# Patient Record
Sex: Male | Born: 1955 | Race: White | Hispanic: No | Marital: Married | Smoking: Never smoker
Health system: Southern US, Community
[De-identification: ages and names within clinical notes are randomized; demographics above are authoritative.]

## PROBLEM LIST (undated history)

## (undated) DIAGNOSIS — T8859XA Other complications of anesthesia, initial encounter: Secondary | ICD-10-CM

## (undated) DIAGNOSIS — I10 Essential (primary) hypertension: Secondary | ICD-10-CM

## (undated) DIAGNOSIS — Z9889 Other specified postprocedural states: Secondary | ICD-10-CM

## (undated) DIAGNOSIS — N529 Male erectile dysfunction, unspecified: Secondary | ICD-10-CM

## (undated) DIAGNOSIS — T4145XA Adverse effect of unspecified anesthetic, initial encounter: Secondary | ICD-10-CM

## (undated) DIAGNOSIS — K819 Cholecystitis, unspecified: Secondary | ICD-10-CM

## (undated) DIAGNOSIS — N179 Acute kidney failure, unspecified: Secondary | ICD-10-CM

## (undated) DIAGNOSIS — E119 Type 2 diabetes mellitus without complications: Secondary | ICD-10-CM

## (undated) DIAGNOSIS — R112 Nausea with vomiting, unspecified: Secondary | ICD-10-CM

## (undated) HISTORY — PX: CHOLECYSTECTOMY: SHX55

---
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
## 1997-04-23 ENCOUNTER — Encounter: Admission: RE | Admit: 1997-04-23 | Discharge: 1997-04-23 | Payer: Self-pay | Admitting: Sports Medicine

## 1997-08-25 ENCOUNTER — Emergency Department (HOSPITAL_COMMUNITY): Admission: EM | Admit: 1997-08-25 | Discharge: 1997-08-25 | Payer: Self-pay | Admitting: Emergency Medicine

## 1997-10-22 ENCOUNTER — Encounter: Admission: RE | Admit: 1997-10-22 | Discharge: 1997-10-22 | Payer: Self-pay | Admitting: Family Medicine

## 1998-10-18 ENCOUNTER — Encounter: Admission: RE | Admit: 1998-10-18 | Discharge: 1998-10-18 | Payer: Self-pay | Admitting: Family Medicine

## 1998-12-06 ENCOUNTER — Encounter: Admission: RE | Admit: 1998-12-06 | Discharge: 1998-12-06 | Payer: Self-pay | Admitting: Family Medicine

## 2001-12-17 ENCOUNTER — Encounter: Admission: RE | Admit: 2001-12-17 | Discharge: 2001-12-17 | Payer: Self-pay | Admitting: Family Medicine

## 2002-01-16 HISTORY — PX: FINGER SURGERY: SHX640

## 2002-03-25 ENCOUNTER — Encounter: Admission: RE | Admit: 2002-03-25 | Discharge: 2002-03-25 | Payer: Self-pay | Admitting: Sports Medicine

## 2002-10-23 ENCOUNTER — Ambulatory Visit (HOSPITAL_COMMUNITY): Admission: EM | Admit: 2002-10-23 | Discharge: 2002-10-23 | Payer: Self-pay | Admitting: Emergency Medicine

## 2002-10-23 ENCOUNTER — Encounter: Payer: Self-pay | Admitting: Orthopedic Surgery

## 2002-10-24 ENCOUNTER — Encounter (INDEPENDENT_AMBULATORY_CARE_PROVIDER_SITE_OTHER): Payer: Self-pay | Admitting: Specialist

## 2003-08-20 ENCOUNTER — Encounter: Admission: RE | Admit: 2003-08-20 | Discharge: 2003-08-20 | Payer: Self-pay | Admitting: Family Medicine

## 2003-08-27 ENCOUNTER — Encounter: Admission: RE | Admit: 2003-08-27 | Discharge: 2003-08-27 | Payer: Self-pay | Admitting: Family Medicine

## 2005-03-21 ENCOUNTER — Ambulatory Visit: Payer: Self-pay | Admitting: Family Medicine

## 2005-03-28 ENCOUNTER — Ambulatory Visit: Payer: Self-pay | Admitting: Family Medicine

## 2005-04-06 ENCOUNTER — Ambulatory Visit: Payer: Self-pay | Admitting: Family Medicine

## 2006-03-15 DIAGNOSIS — E1165 Type 2 diabetes mellitus with hyperglycemia: Secondary | ICD-10-CM

## 2006-03-15 DIAGNOSIS — E118 Type 2 diabetes mellitus with unspecified complications: Secondary | ICD-10-CM

## 2006-06-04 ENCOUNTER — Telehealth: Payer: Self-pay | Admitting: *Deleted

## 2006-12-05 ENCOUNTER — Emergency Department (HOSPITAL_COMMUNITY): Admission: EM | Admit: 2006-12-05 | Discharge: 2006-12-05 | Payer: Self-pay | Admitting: Emergency Medicine

## 2006-12-06 ENCOUNTER — Telehealth: Payer: Self-pay | Admitting: *Deleted

## 2007-02-25 ENCOUNTER — Telehealth: Payer: Self-pay | Admitting: *Deleted

## 2007-08-13 ENCOUNTER — Telehealth: Payer: Self-pay | Admitting: Family Medicine

## 2007-11-19 ENCOUNTER — Telehealth: Payer: Self-pay | Admitting: *Deleted

## 2007-11-20 ENCOUNTER — Encounter: Payer: Self-pay | Admitting: Family Medicine

## 2008-01-17 DIAGNOSIS — N529 Male erectile dysfunction, unspecified: Secondary | ICD-10-CM

## 2008-01-17 HISTORY — DX: Male erectile dysfunction, unspecified: N52.9

## 2008-02-14 ENCOUNTER — Telehealth (INDEPENDENT_AMBULATORY_CARE_PROVIDER_SITE_OTHER): Payer: Self-pay | Admitting: *Deleted

## 2008-03-11 ENCOUNTER — Ambulatory Visit: Payer: Self-pay | Admitting: Family Medicine

## 2008-03-11 DIAGNOSIS — M7710 Lateral epicondylitis, unspecified elbow: Secondary | ICD-10-CM | POA: Insufficient documentation

## 2008-03-11 LAB — CONVERTED CEMR LAB
ALT: 9 units/L (ref 0–53)
AST: 10 units/L (ref 0–37)
Albumin: 4.5 g/dL (ref 3.5–5.2)
Alkaline Phosphatase: 52 units/L (ref 39–117)
BUN: 16 mg/dL (ref 6–23)
CO2: 24 meq/L (ref 19–32)
Calcium: 9.4 mg/dL (ref 8.4–10.5)
Chloride: 101 meq/L (ref 96–112)
Cholesterol: 194 mg/dL (ref 0–200)
Creatinine, Ser: 0.78 mg/dL (ref 0.40–1.50)
Glucose, Bld: 294 mg/dL — ABNORMAL HIGH (ref 70–99)
HCT: 44.7 % (ref 39.0–52.0)
HDL: 41 mg/dL (ref >39)
Hemoglobin: 15.6 g/dL (ref 13.0–17.0)
Hgb A1c MFr Bld: 9.6 %
LDL Cholesterol: 123 mg/dL — ABNORMAL HIGH (ref 0–99)
MCHC: 34.9 g/dL (ref 30.0–36.0)
MCV: 88.7 fL (ref 78.0–100.0)
Platelets: 197 10*3/uL (ref 150–400)
Potassium: 4.3 meq/L (ref 3.5–5.3)
RBC: 5.04 M/uL (ref 4.22–5.81)
RDW: 12.7 % (ref 11.5–15.5)
Sodium: 140 meq/L (ref 135–145)
TSH: 1.7 microintl units/mL (ref 0.350–4.50)
Total Bilirubin: 1.5 mg/dL — ABNORMAL HIGH (ref 0.3–1.2)
Total CHOL/HDL Ratio: 4.7
Total Protein: 7.4 g/dL (ref 6.0–8.3)
Triglycerides: 148 mg/dL (ref <150)
VLDL: 30 mg/dL (ref 0–40)
WBC: 4.4 10*3/uL (ref 4.0–10.5)

## 2008-03-16 ENCOUNTER — Encounter: Payer: Self-pay | Admitting: Family Medicine

## 2008-03-18 ENCOUNTER — Telehealth (INDEPENDENT_AMBULATORY_CARE_PROVIDER_SITE_OTHER): Payer: Self-pay | Admitting: *Deleted

## 2009-03-10 ENCOUNTER — Telehealth: Payer: Self-pay | Admitting: Family Medicine

## 2009-04-13 ENCOUNTER — Encounter: Payer: Self-pay | Admitting: Family Medicine

## 2009-05-24 ENCOUNTER — Telehealth: Payer: Self-pay | Admitting: *Deleted

## 2009-05-31 ENCOUNTER — Ambulatory Visit: Payer: Self-pay | Admitting: Family Medicine

## 2009-06-22 ENCOUNTER — Encounter: Payer: Self-pay | Admitting: *Deleted

## 2009-07-20 ENCOUNTER — Encounter: Payer: Self-pay | Admitting: *Deleted

## 2010-02-17 NOTE — Letter (Signed)
Summary: Suspension Letter  [facility]  [address]   [place], [place] [address]   Phone: [phone]  Fax: [phone]    04/13/2009  Brendan [name] [address] [place], [place]  [address]  Dear Mr. Brendan [name],  You have missed 4 scheduled appointments with our practice.If you cannot keep your appointment, we expect you to call and cancel at least 24 hours before your appointment time.  As per our policy, we will now only give you limited medical services. means we will not call in a refill for you, or complete a form or make a referral except when you are here for a scheduled office visit.   If you miss 1 more appointments in the next year, we will dismiss you from our practice.  We hope this does not happen.  If you keep your appointments for the next year you will be returned to regular patient status.  We hope these changes will encourage you to keep your appointments so we may provide you the best medical care.   Our office staff can be reached at [phone] Monday through Friday from 8:30 a.m.-5:00 p.m. and will be glad to schedule your appointment as necessary.     Sincerely,   [facility] Document: Suspension Letter mailed.  Appended Document: Suspension Letter Certified letter returned undeliverable. Brendan [name] 05/10/09 2:20 PM

## 2010-02-17 NOTE — Letter (Signed)
Summary: Termination of Care Letter  [facility]  [address]   [place], [place] [address]   Phone: [phone]  Fax: [phone]    07/20/2009 MRN: [id number]  Dear Mr. Brendan [name],  Please be advised that the [facility] will no longer be your healthcare provider.  Accordingly, all further requests for appointments and office visits will be declined.  As such, you are not allowed on the premises of the [facility].  Should you have another physician who will take over your care, please advise [place] and we will forward a copy of your medical records.  If you do not know of another physician, we will assist you with a referral.  The termination of your relationship with this office is effective immediately.  In the next 30 days if you find that you have a need for medical care we will refer you to another provider for that care.  Thank you for your cooperation.  Sincerely,  Elray Mcgregor RN  [facility]

## 2010-02-17 NOTE — Letter (Signed)
Summary: Suspension Letter  [facility]  [address]   [place], [place] [address]   Phone: [phone]  Fax: [phone]    06/22/2009  [place], [place]  [address]  Dear Mr. Brendan [name],  You have missed 4 scheduled appointments with our practice.If you cannot keep your appointment, we expect you to call and cancel at least 24 hours before your appointment time.  As per our policy, we will now only give you limited medical services. means we will not call in a refill for you, or complete a form or make a referral except when you are here for a scheduled office visit.   If you miss 2 more appointments in the next year, we will dismiss you from our practice.  We hope this does not happen.  If you keep your appointments for the next year you will be returned to regular patient status.  We hope these changes will encourage you to keep your appointments so we may provide you the best medical care.   Our office staff can be reached at [phone] Monday through Friday from 8:30 a.m.-5:00 p.m. and will be glad to schedule your appointment as necessary.     Sincerely,   [facility] Document: Suspension Letter cert mailed

## 2010-02-17 NOTE — Assessment & Plan Note (Signed)
Summary: Med refill   Vital Signs:  Patient profile:   55 year old male Weight:      150 pounds Temp:     98.3 degrees F oral Pulse rate:   95 / minute BP sitting:   142 / 92  (right arm) Cuff size:   regular  Vitals Entered By: Schuyler Amor CMA (May 31, 2009 2:52 PM) CC: F/U meds Is Patient Diabetic? Yes Pain Assessment Patient in pain? no        CC:  F/U meds.  History of Present Illness: Here for refill on metformin.  Works out of town and has not come in for follow up.  Agreed to make apt with primary MD in next few weeks for CPE.  He will come in before hand to have labs drawn and available for that day.  He had not complaints.  He will also have wife check BP at home since it was elevated today.  Did not need VIagra refilled.  Habits & Providers  Alcohol-Tobacco-Diet     Tobacco Status: never  Allergies: No Known Drug Allergies  Social History: Smoking Status:  never  Physical Exam  General:  Thin middle aged man,in no acute distress; alert,appropriate and cooperative   Impression & Recommendations:  Problem # 1:  DIABETES MELLITUS II, UNCOMPLICATED ([id number]) Refilled meds today, set up for scheduled labs for prevention before visit with primary MD. His updated medication list for this problem includes:    Bayer Childrens Aspirin 81 Mg Chew (Aspirin) .Marland Kitchen... Take 1 tablet by mouth once a day    Glucophage 500 Mg Tabs (Metformin hcl) .Marland Kitchen... 1 by mouth two times a day  Orders: [facility]- Est Level  2 ([address]212)Future Orders: Comp Met-[facility] [id number]) ... 05/23/2010 Lipid-[facility] [id number]) ... 05/23/2010 UA Microalbumin-[facility] [id number]) ... 05/23/2010 A1C-[facility] [id number]) ... 05/23/2010  Complete Medication List: 1)  Bayer Childrens Aspirin 81 Mg Chew (Aspirin) .... Take 1 tablet by mouth once a day 2)  Glucophage 500 Mg Tabs (Metformin hcl) .Marland Kitchen.. 1 by mouth two times a day 3)  Viagra 100 Mg Tabs (Sildenafil citrate) .Marland Kitchen.. 1 tab as directed  Other Orders: Future  Orders: PSA-[facility] [id number]) ... 05/23/2010  Patient Instructions: 1)  Apt with Dr. Nori Riis in the next 30 days for a physical. 2)  Come in for fasting labs before the appointment, orders are entered Prescriptions: GLUCOPHAGE 500 MG  TABS (METFORMIN HCL) 1 by mouth two times a day  #60 x 3   Entered and Authorized by:   Tereasa Coop NP   Signed by:   Tereasa Coop NP on 05/31/2009   Method used:   Electronically to        [facility] (retail)       [address]       [place], [place]  [address]       Ph: [phone]       Fax: [phone]   RxID:   [id number]

## 2010-02-17 NOTE — Progress Notes (Signed)
Summary: Rx Req  Phone Note Refill Request Call back at [phone] Message from:  Patient  Refills Requested: Medication #1:  GLUCOPHAGE 500 MG  TABS 1 by mouth two times a day PT USES [facility] IN [place].  PT HAS F/UP APPT WITH SUSAN SAXON ON 05/31/09.  Initial call taken by: Raymond Gurney,  May 24, 2009 1:38 PM  Follow-up for Phone Call        to pcp. Follow-up by: Marcell Barlow RN,  May 24, 2009 1:54 PM  Additional Follow-up for Phone Call Additional follow up Details #1::        [place] he can get refills IF he shows up for appointment Thanks!  Dorcas Mcmurray MD  May 24, 2009 4:42 PM     Prescriptions: GLUCOPHAGE 500 MG  TABS (METFORMIN HCL) 1 by mouth two times a day  #60 Each x 0   Entered and Authorized by:   Dorcas Mcmurray MD   Signed by:   Dorcas Mcmurray MD on 05/24/2009   Method used:   Electronically to        [facility] (retail)       [address]       [place], [place]  [address]       Ph: [phone]       Fax: [phone]   RxID:   [id number]  called and informed pt of rx.Audelia Hives CMA  May 24, 2009 5:33 PM

## 2010-02-17 NOTE — Progress Notes (Signed)
Summary: Rx Req  Phone Note Refill Request Call back at Home Phone [phone] Message from:  Patient  Refills Requested: Medication #1:  GLUCOPHAGE 500 MG  TABS 1 by mouth two times a day PT USES [facility] IN [place].  Initial call taken by: Raymond Gurney,  March 10, 2009 10:19 AM  Follow-up for Phone Call        [facility] please call in one month only. Please tell him he is NOT following our arrangement--as a diabetic I need to see him at LEAST every 4 months. This is his LAST REFILL unless he starts showing up for his appointments Thanks!  Dorcas Mcmurray MD  March 10, 2009 3:57 PM   Additional Follow-up for Phone Call Additional follow up Details #1::        Left message for Pt to return call Additional Follow-up by: Isabelle Course,  March 10, 2009 4:42 PM    Additional Follow-up for Phone Call Additional follow up Details #2::    Someone answered phone, the number we have is not good to reach him, called Rx into [facility] on [place] and left message that this is his last refill til he sees Dr Nori Riis for F/U on DM with the Rx.  To PCP Follow-up by: Isabelle Course,  March 11, 2009 11:34 AM

## 2010-06-03 NOTE — Op Note (Signed)
NAME:  ANTWANE, VONHOLTEN                        ACCOUNT NO.:  [id number]   MEDICAL RECORD NO.:  [id number]                   PATIENT TYPE:  EMS   LOCATION:  ED                                   FACILITY:  [facility]   PHYSICIAN:  Satira Anis. Blanchie Dessert, M.D.         DATE OF BIRTH:  [birth year]   DATE OF PROCEDURE:  DATE OF DISCHARGE:                                 OPERATIVE REPORT   PREOPERATIVE DIAGNOSES:  1. 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  2. Left middle finger circumferential laceration with viable tip.   POSTOPERATIVE DIAGNOSES:  1. 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  2. Left middle finger circumferential laceration with viable tip.   PROCEDURE:  1. I&D left middle finger skin and subcutaneous tissue and deep tendon     tissue with loose closure performed.  2. I&D left ring finger with exploration of neurovascular structures and     subsequent completion of a near amputation which was dysvascular. This     was a revision amputation in essence with bilateral neurectomy.   SURGEON:  Satira Anis. Amedeo Plenty, M.D.   ASSISTANT:  None.   COMPLICATIONS:  None.   ANESTHESIA:  Local Marcaine block with light IV sedation.   TOURNIQUET TIME:  Zero.   DRAINS:  None.   INDICATIONS FOR PROCEDURE:  The patient is a 55 year old male who presents  with the above mentioned diagnosis. I have counselled him in regards to the  risks and benefits of surgery including risk of infection, bleeding,  anesthesia, damage to normal structures and failure of the surgery to  accomplish its intended goals of relieving symptoms and restoring function.  With this in mind, he desires to proceed. All questions have been encouraged  and answered preoperatively.   FINDINGS:  This patient had a dysvascular ring finger at the DIP joint  level. I did I&D this, explore  it and pinned this to see if I could get any  meaningful blood flow. I then explored the neurovascular structures, they  were all unrepairable and given the dysvascular digit, I then completed the  amputation at the DIP level including bilateral neurectomies. The middle  finger was intact in terms of his blood flow. This underwent I&D and loose  closure. Small and index fingers were intact.   DESCRIPTION OF PROCEDURE:  The patient was seen by myself and anesthesia. He  was given a gram of Ancef preoperatively and a tetanus shot, taken to the  operative suite. Consent was signed. He understood the nature of the  operation and that ring finger amputation was 99% likely in my estimation. I  discussed these issues with him preoperatively. Once in the operative suite,  he was laid supine, appropriately padded, prepped and draped in the usual  sterile fashion, Betadine  scrub and paint performed by myself. Once this was  done, I then removed two tight rings atraumatically about the ring finger.  Following this, I then performed an I&D with copious amounts of warm lavage  under a sterile field. The Marcaine block previously placed on the holding  area by myself was in excellent working position and he had no pain. He was  given IV sedation by the anesthesiology department. Once this was done, the  patient had the ring finger identified, this was dysvascular and looked  quite poor with neurovascular injury as well as tendon injury. I did pin  this with the 0.045 K wire to see if there would be any meaningful  vascularity to be accomplished. I then explored the neurovascular structures  which were inadequate and severely torn and ripped apart. The patient had  segmental injury about the area and no possibility of replantation. Thus I  removed the pin and then completed the amputation. This was done by placing  hemostats about the nerves and performing bilateral neurectomies with  crushing  cauterization and sharp knife blade sectioning and allowing this to  retract proximally. Following this, I then removed the remaining portion of  bone at the DIP joint and completed a DIP level amputation. The flexor  tendon was already markedly abnormal and I did pull the remnants distally,  resect this and allow it to retract. The vascular structures were obviously  torn, clotted and of poor quality. These were addressed as well. I irrigated  this well and then closed the wound with a dorsal flap after I oblated the  nail as best as possible with the notion that nail remnant could grow back.  The patient tolerated this well without difficulty. Following this, the  middle finger was dressed with I&D of the skin and subcutaneous tissue,  tendinous tissue and very loose closure with interrupted chromic suture. The  middle finger maintained excellent vascularity throughout the case, there  was no problematic circulatory issues with this. Following this, I placed  Xeroform on the wounds and a sterile dressing with volar plaster splint. He  tolerated this well without difficulty. He was taken to the recovery room  after the surgery and was given a gram of Ancef. He will be discharged home  on Keflex, Percocet and Robaxin and return to see me in the office in seven  days. I have discussed with him the do's and dont's, etc. and all questions  have been encouraged and answered. I will ask him to avoid caffeine and  primary or secondary smoke. He will notify me should any problems occur. It  has been an absolute pleasure to see him today. All questions have been  encouraged and answered.                                               Satira Anis. Blanchie Dessert, M.D.    Sampson Si  D:  10/23/2002  T:  10/24/2002  Job:  [id number]

## 2010-10-25 LAB — URINALYSIS, ROUTINE W REFLEX MICROSCOPIC
Bilirubin Urine: NEGATIVE
Glucose, UA: >1000 — AB
Hgb urine dipstick: NEGATIVE
Ketones, ur: 15 — AB
Leukocytes, UA: NEGATIVE
Nitrite: NEGATIVE
Protein, ur: NEGATIVE
Specific Gravity, Urine: 1.04 — ABNORMAL HIGH
Urobilinogen, UA: 0.2
pH: 5.5

## 2010-10-25 LAB — I-STAT 8, (EC8 V) (CONVERTED LAB)
BUN: 16
Bicarbonate: 29.8 — ABNORMAL HIGH
Chloride: 100
Glucose, Bld: 446 — ABNORMAL HIGH
HCT: 51
Hemoglobin: 17.3 — ABNORMAL HIGH
Operator id: 288831
Sodium: 133 — ABNORMAL LOW

## 2010-10-25 LAB — URINE MICROSCOPIC-ADD ON

## 2012-02-24 ENCOUNTER — Emergency Department (HOSPITAL_COMMUNITY): Payer: Self-pay

## 2012-02-24 ENCOUNTER — Emergency Department (HOSPITAL_COMMUNITY)
Admission: EM | Admit: 2012-02-24 | Discharge: 2012-02-24 | Disposition: A | Discharge status: Home / Self Care | Payer: Self-pay | Attending: Emergency Medicine | Admitting: Emergency Medicine

## 2012-02-24 DIAGNOSIS — W07XXXA Fall from chair, initial encounter: Secondary | ICD-10-CM | POA: Insufficient documentation

## 2012-02-24 DIAGNOSIS — S4980XA Other specified injuries of shoulder and upper arm, unspecified arm, initial encounter: Secondary | ICD-10-CM | POA: Insufficient documentation

## 2012-02-24 DIAGNOSIS — S199XXA Unspecified injury of neck, initial encounter: Secondary | ICD-10-CM | POA: Insufficient documentation

## 2012-02-24 DIAGNOSIS — R51 Headache: Secondary | ICD-10-CM | POA: Insufficient documentation

## 2012-02-24 DIAGNOSIS — Y939 Activity, unspecified: Secondary | ICD-10-CM | POA: Insufficient documentation

## 2012-02-24 DIAGNOSIS — S46909A Unspecified injury of unspecified muscle, fascia and tendon at shoulder and upper arm level, unspecified arm, initial encounter: Secondary | ICD-10-CM | POA: Insufficient documentation

## 2012-02-24 DIAGNOSIS — S0993XA Unspecified injury of face, initial encounter: Secondary | ICD-10-CM | POA: Insufficient documentation

## 2012-02-24 DIAGNOSIS — S0100XA Unspecified open wound of scalp, initial encounter: Secondary | ICD-10-CM | POA: Insufficient documentation

## 2012-02-24 DIAGNOSIS — Y929 Unspecified place or not applicable: Secondary | ICD-10-CM | POA: Insufficient documentation

## 2012-02-24 DIAGNOSIS — R42 Dizziness and giddiness: Secondary | ICD-10-CM | POA: Insufficient documentation

## 2012-02-26 MED FILL — Oxycodone w/ Acetaminophen Tab 5-325 MG: ORAL | Qty: 1 | Status: AC

## 2012-02-26 MED FILL — Tet Tox-Diph-Acell Pertuss Ad Inj 5-2.5-18.5 LF-LF-MCG/0.5ML: INTRAMUSCULAR | Qty: 0.5 | Status: AC

## 2012-02-26 MED FILL — Oxycodone w/ Acetaminophen Tab 5-325 MG: ORAL | Qty: 2 | Status: AC

## 2012-03-04 ENCOUNTER — Telehealth: Payer: Self-pay | Admitting: Psychology

## 2012-03-04 NOTE — Telephone Encounter (Signed)
Received VM at 11:50 from Brendan [name] looking to confirm an appointment with Dr. Tammi Klippel.  Checked record - not a patient of the [facility]  No appointments listed.  Can't tell where he might have gotten a referral to the Dr. Tammi Klippel that works with me in the [facility] at [facility].  Tried the call back number left on my VM - no answer no machine.  Tried the other two numbers listed as contacts - both were disconnected.  Will await follow-up from the patient.

## 2012-03-11 ENCOUNTER — Ambulatory Visit (INDEPENDENT_AMBULATORY_CARE_PROVIDER_SITE_OTHER): Payer: Self-pay | Admitting: Family Medicine

## 2012-03-11 ENCOUNTER — Encounter: Payer: Self-pay | Admitting: Family Medicine

## 2012-03-11 VITALS — BP 150/93 | HR 101 | Temp 97.9°F | Ht 71.0 in | Wt 146.9 lb

## 2012-03-11 DIAGNOSIS — E119 Type 2 diabetes mellitus without complications: Secondary | ICD-10-CM

## 2012-03-11 DIAGNOSIS — R030 Elevated blood-pressure reading, without diagnosis of hypertension: Secondary | ICD-10-CM

## 2012-03-11 DIAGNOSIS — Z0000 Encounter for general adult medical examination without abnormal findings: Secondary | ICD-10-CM

## 2012-03-11 DIAGNOSIS — IMO0001 Reserved for inherently not codable concepts without codable children: Secondary | ICD-10-CM | POA: Insufficient documentation

## 2012-03-11 LAB — POCT GLYCOSYLATED HEMOGLOBIN (HGB A1C): Hemoglobin A1C: 12

## 2012-03-11 MED ORDER — METFORMIN HCL 500 MG PO TABS: 500.0000 mg | ORAL_TABLET | Freq: Two times a day (BID) | ORAL | Status: DC

## 2012-03-11 MED ORDER — GLIPIZIDE 5 MG PO TABS: 5.0000 mg | ORAL_TABLET | Freq: Two times a day (BID) | ORAL | Status: DC

## 2012-03-11 NOTE — Assessment & Plan Note (Addendum)
Patient instructed to restart daily 81 mg aspirin for primary prevention of CAD.

## 2012-03-11 NOTE — Assessment & Plan Note (Addendum)
BP elevated at 150/93 today.  Will recheck at next visit.  If elevated with discuss starting antihypertensive medication(s) with patient.

## 2012-03-11 NOTE — Progress Notes (Signed)
Subjective:     Patient ID: Brendan [name], male   DOB: 10-21-1955, 57 y.o.   MRN: [id number]  HPI 57 year old gentleman with a PMH of DM-2 presents to the clinic to reestablish care.  1) DM - 2 - Patient was last seen in 2011.  Patient was on metformin 500 mg BID at that time.  - He has been out of medication for approximately 3 years. - Patient has a meter at home but does not take his blood sugar. - Patient requesting restart of his metformin. - ROS: Denies chest pain, SOB. Reports recent cough. Also reports polyuria and polydispsia.  Denies visual changes.  Review of Systems See HPI    Objective:   Physical Exam  Filed Vitals:   03/11/12 0850  BP: 150/93  Pulse: 101  Temp: 97.9 F (36.6 C)   General: well appearing, NAD. Neck: supple, no thyromegaly. Heart: RRR, no murmurs, rubs, or gallops. Lungs: CTAB. No rales, rhonchi, or wheeze. Abdomen: soft, nontender, nondistended. No organomegaly. Extremities: no cyanosis, clubbing, or edema.    Assessment:         Plan:

## 2012-03-11 NOTE — Patient Instructions (Addendum)
I have sent in your prescriptions to the pharmacy.  Please take them as prescribed.   Additionally, please start checking your blood sugar daily (at least once a day).  Please record these readings for me and bring it to your next visit.  Please start daily baby aspirin (81 mg)  Follow up with me in 3 months or sooner if needed.

## 2012-03-11 NOTE — Assessment & Plan Note (Addendum)
A1C today was 12.0.  Restarted Metformin 500 mg BID today.  Also started Glipizide 5 mg BID.  Patient encourage to take blood sugar daily.  Patient instructed to call if he needs a new meter and additional strips and lancets.  Will see patient back in 3 months with A1C and CMP at that time.

## 2012-03-12 ENCOUNTER — Telehealth: Payer: Self-pay | Admitting: Family Medicine

## 2012-03-12 MED ORDER — RELION PRIME MONITOR DEVI: 1.0000 | Status: DC

## 2012-03-12 MED ORDER — RELION LANCETS ULTRA-THIN 30G MISC: Status: DC

## 2012-03-12 MED ORDER — GLUCOSE BLOOD VI STRP: ORAL_STRIP | Status: DC

## 2012-03-12 NOTE — Telephone Encounter (Signed)
Rx for Meter, Strips and Lancets it at the front.

## 2012-03-12 NOTE — Telephone Encounter (Signed)
Pt states that his meter is not working well and needs another script to get this as well as test strips.  pls call when ready   He also forgot to get his flu shot yesterday and wants to get this when he picks up script.

## 2013-03-16 DIAGNOSIS — K819 Cholecystitis, unspecified: Secondary | ICD-10-CM

## 2013-03-16 HISTORY — DX: Cholecystitis, unspecified: K81.9

## 2013-04-01 ENCOUNTER — Observation Stay (HOSPITAL_COMMUNITY)
Admission: EM | Admit: 2013-04-01 | Discharge: 2013-04-04 | Disposition: A | Discharge status: Home / Self Care | Payer: Self-pay | Attending: Surgery | Admitting: Surgery

## 2013-04-01 ENCOUNTER — Encounter (HOSPITAL_COMMUNITY): Payer: Self-pay | Admitting: Emergency Medicine

## 2013-04-01 ENCOUNTER — Observation Stay (HOSPITAL_COMMUNITY): Payer: Self-pay

## 2013-04-01 ENCOUNTER — Emergency Department (HOSPITAL_COMMUNITY): Payer: Self-pay

## 2013-04-01 DIAGNOSIS — Y836 Removal of other organ (partial) (total) as the cause of abnormal reaction of the patient, or of later complication, without mention of misadventure at the time of the procedure: Secondary | ICD-10-CM | POA: Insufficient documentation

## 2013-04-01 DIAGNOSIS — N529 Male erectile dysfunction, unspecified: Secondary | ICD-10-CM | POA: Insufficient documentation

## 2013-04-01 DIAGNOSIS — F1010 Alcohol abuse, uncomplicated: Secondary | ICD-10-CM | POA: Insufficient documentation

## 2013-04-01 DIAGNOSIS — K8043 Calculus of bile duct with acute cholecystitis with obstruction: Principal | ICD-10-CM | POA: Insufficient documentation

## 2013-04-01 DIAGNOSIS — K219 Gastro-esophageal reflux disease without esophagitis: Secondary | ICD-10-CM | POA: Insufficient documentation

## 2013-04-01 DIAGNOSIS — IMO0001 Reserved for inherently not codable concepts without codable children: Secondary | ICD-10-CM | POA: Insufficient documentation

## 2013-04-01 DIAGNOSIS — R740 Nonspecific elevation of levels of transaminase and lactic acid dehydrogenase [LDH]: Secondary | ICD-10-CM

## 2013-04-01 DIAGNOSIS — R7402 Elevation of levels of lactic acid dehydrogenase (LDH): Secondary | ICD-10-CM

## 2013-04-01 DIAGNOSIS — E1165 Type 2 diabetes mellitus with hyperglycemia: Secondary | ICD-10-CM

## 2013-04-01 DIAGNOSIS — Z794 Long term (current) use of insulin: Secondary | ICD-10-CM | POA: Insufficient documentation

## 2013-04-01 DIAGNOSIS — I10 Essential (primary) hypertension: Secondary | ICD-10-CM | POA: Insufficient documentation

## 2013-04-01 DIAGNOSIS — R7401 Elevation of levels of liver transaminase levels: Secondary | ICD-10-CM

## 2013-04-01 DIAGNOSIS — R1011 Right upper quadrant pain: Secondary | ICD-10-CM

## 2013-04-01 DIAGNOSIS — IMO0002 Reserved for concepts with insufficient information to code with codable children: Secondary | ICD-10-CM

## 2013-04-01 DIAGNOSIS — E118 Type 2 diabetes mellitus with unspecified complications: Secondary | ICD-10-CM

## 2013-04-01 DIAGNOSIS — K810 Acute cholecystitis: Secondary | ICD-10-CM

## 2013-04-01 DIAGNOSIS — K8050 Calculus of bile duct without cholangitis or cholecystitis without obstruction: Secondary | ICD-10-CM

## 2013-04-01 DIAGNOSIS — K9186 Retained cholelithiasis following cholecystectomy: Secondary | ICD-10-CM | POA: Insufficient documentation

## 2013-04-01 DIAGNOSIS — K8020 Calculus of gallbladder without cholecystitis without obstruction: Secondary | ICD-10-CM

## 2013-04-01 DIAGNOSIS — Z0000 Encounter for general adult medical examination without abnormal findings: Secondary | ICD-10-CM

## 2013-04-01 HISTORY — DX: Essential (primary) hypertension: I10

## 2013-04-01 HISTORY — DX: Nausea with vomiting, unspecified: R11.2

## 2013-04-01 HISTORY — DX: Adverse effect of unspecified anesthetic, initial encounter: T41.45XA

## 2013-04-01 HISTORY — DX: Cholecystitis, unspecified: K81.9

## 2013-04-01 HISTORY — DX: Other specified postprocedural states: Z98.890

## 2013-04-01 HISTORY — DX: Other complications of anesthesia, initial encounter: T88.59XA

## 2013-04-01 HISTORY — DX: Male erectile dysfunction, unspecified: N52.9

## 2013-04-01 HISTORY — DX: Type 2 diabetes mellitus without complications: E11.9

## 2013-04-01 LAB — URINALYSIS, ROUTINE W REFLEX MICROSCOPIC
Glucose, UA: >1000 mg/dL — AB
Hgb urine dipstick: NEGATIVE
LEUKOCYTES UA: NEGATIVE
NITRITE: NEGATIVE
PH: 5 (ref 5.0–8.0)
Protein, ur: NEGATIVE mg/dL
SPECIFIC GRAVITY, URINE: 1.038 — AB (ref 1.005–1.030)
UROBILINOGEN UA: 0.2 mg/dL (ref 0.0–1.0)

## 2013-04-01 LAB — CBC WITH DIFFERENTIAL/PLATELET
Basophils Absolute: 0 10*3/uL (ref 0.0–0.1)
Basophils Relative: 0 % (ref 0–1)
EOS ABS: 0 10*3/uL (ref 0.0–0.7)
Eosinophils Relative: 0 % (ref 0–5)
HCT: 40.8 % (ref 39.0–52.0)
Hemoglobin: 14.7 g/dL (ref 13.0–17.0)
LYMPHS ABS: 0.9 10*3/uL (ref 0.7–4.0)
LYMPHS PCT: 5 % — AB (ref 12–46)
MCH: 31.3 pg (ref 26.0–34.0)
MCHC: 36 g/dL (ref 30.0–36.0)
MCV: 86.8 fL (ref 78.0–100.0)
Monocytes Absolute: 0.5 10*3/uL (ref 0.1–1.0)
Monocytes Relative: 3 % (ref 3–12)
NEUTROS PCT: 92 % — AB (ref 43–77)
Neutro Abs: 14.9 10*3/uL — ABNORMAL HIGH (ref 1.7–7.7)
PLATELETS: 248 10*3/uL (ref 150–400)
RBC: 4.7 MIL/uL (ref 4.22–5.81)
RDW: 12 % (ref 11.5–15.5)
WBC: 16.3 10*3/uL — AB (ref 4.0–10.5)

## 2013-04-01 LAB — GLUCOSE, CAPILLARY
GLUCOSE-CAPILLARY: 305 mg/dL — AB (ref 70–99)
Glucose-Capillary: 201 mg/dL — ABNORMAL HIGH (ref 70–99)

## 2013-04-01 LAB — COMPREHENSIVE METABOLIC PANEL
ALT: 334 U/L — ABNORMAL HIGH (ref 0–53)
AST: 156 U/L — ABNORMAL HIGH (ref 0–37)
Albumin: 3.4 g/dL — ABNORMAL LOW (ref 3.5–5.2)
Alkaline Phosphatase: 330 U/L — ABNORMAL HIGH (ref 39–117)
BILIRUBIN TOTAL: 7.1 mg/dL — AB (ref 0.3–1.2)
BUN: 18 mg/dL (ref 6–23)
CHLORIDE: 87 meq/L — AB (ref 96–112)
CO2: 22 meq/L (ref 19–32)
CREATININE: 0.62 mg/dL (ref 0.50–1.35)
Calcium: 9.4 mg/dL (ref 8.4–10.5)
Glucose, Bld: 341 mg/dL — ABNORMAL HIGH (ref 70–99)
Potassium: 4.3 mEq/L (ref 3.7–5.3)
Sodium: 131 mEq/L — ABNORMAL LOW (ref 137–147)
Total Protein: 7.2 g/dL (ref 6.0–8.3)

## 2013-04-01 LAB — URINE MICROSCOPIC-ADD ON

## 2013-04-01 LAB — TROPONIN I: Troponin I: <0.3 ng/mL (ref <0.30)

## 2013-04-01 LAB — LIPASE, BLOOD: LIPASE: 17 U/L (ref 11–59)

## 2013-04-01 LAB — HEMOGLOBIN A1C
Hgb A1c MFr Bld: 12 % — ABNORMAL HIGH (ref <5.7)
Mean Plasma Glucose: 298 mg/dL — ABNORMAL HIGH (ref <117)

## 2013-04-01 LAB — CBG MONITORING, ED
GLUCOSE-CAPILLARY: 326 mg/dL — AB (ref 70–99)
Glucose-Capillary: 293 mg/dL — ABNORMAL HIGH (ref 70–99)

## 2013-04-01 MED ORDER — MORPHINE SULFATE 2 MG/ML IJ SOLN
2.0000 mg | INTRAMUSCULAR | Status: DC | PRN
  Administered 2013-04-01 – 2013-04-02 (×5): 2 mg via INTRAVENOUS
  Filled 2013-04-01 (×6): qty 1

## 2013-04-01 MED ORDER — ACETAMINOPHEN 325 MG PO TABS: 650.0000 mg | ORAL_TABLET | Freq: Four times a day (QID) | ORAL | Status: DC | PRN

## 2013-04-01 MED ORDER — ONDANSETRON HCL 4 MG/2ML IJ SOLN
4.0000 mg | Freq: Four times a day (QID) | INTRAMUSCULAR | Status: DC | PRN
Start: 2013-04-01 — End: 2013-04-04

## 2013-04-01 MED ORDER — PANTOPRAZOLE SODIUM 40 MG PO TBEC
40.0000 mg | DELAYED_RELEASE_TABLET | Freq: Every day | ORAL | Status: DC
  Administered 2013-04-01 – 2013-04-03 (×2): 40 mg via ORAL
  Filled 2013-04-01 (×2): qty 1

## 2013-04-01 MED ORDER — GADOBENATE DIMEGLUMINE 529 MG/ML IV SOLN
15.0000 mL | Freq: Once | INTRAVENOUS | Status: AC
  Administered 2013-04-01: 12 mL via INTRAVENOUS

## 2013-04-01 MED ORDER — ACETAMINOPHEN 650 MG RE SUPP: 650.0000 mg | Freq: Four times a day (QID) | RECTAL | Status: DC | PRN

## 2013-04-01 MED ORDER — ONDANSETRON HCL 4 MG/2ML IJ SOLN
4.0000 mg | Freq: Once | INTRAMUSCULAR | Status: AC
  Administered 2013-04-01: 4 mg via INTRAVENOUS
  Filled 2013-04-01: qty 2

## 2013-04-01 MED ORDER — HYDROCODONE-ACETAMINOPHEN 5-325 MG PO TABS
1.0000 | ORAL_TABLET | ORAL | Status: DC | PRN
  Administered 2013-04-01 – 2013-04-03 (×5): 2 via ORAL
  Filled 2013-04-01 (×5): qty 2

## 2013-04-01 MED ORDER — SODIUM CHLORIDE 0.9 % IV SOLN: INTRAVENOUS | Status: DC

## 2013-04-01 MED ORDER — SODIUM CHLORIDE 0.9 % IV SOLN
INTRAVENOUS | Status: DC
  Administered 2013-04-01 – 2013-04-04 (×6): via INTRAVENOUS

## 2013-04-01 MED ORDER — HYDROMORPHONE HCL PF 1 MG/ML IJ SOLN
1.0000 mg | Freq: Once | INTRAMUSCULAR | Status: AC
  Administered 2013-04-01: 1 mg via INTRAVENOUS
  Filled 2013-04-01: qty 1

## 2013-04-01 MED ORDER — INSULIN ASPART 100 UNIT/ML ~~LOC~~ SOLN
0.0000 [IU] | Freq: Three times a day (TID) | SUBCUTANEOUS | Status: DC
  Administered 2013-04-01: 11 [IU] via SUBCUTANEOUS
  Administered 2013-04-02: 8 [IU] via SUBCUTANEOUS
  Administered 2013-04-03: 5 [IU] via SUBCUTANEOUS
  Administered 2013-04-03: 3 [IU] via SUBCUTANEOUS
  Administered 2013-04-03: 8 [IU] via SUBCUTANEOUS
  Administered 2013-04-04: 3 [IU] via SUBCUTANEOUS
  Administered 2013-04-04: 2 [IU] via SUBCUTANEOUS

## 2013-04-01 MED ORDER — ENOXAPARIN SODIUM 40 MG/0.4ML ~~LOC~~ SOLN
40.0000 mg | Freq: Every day | SUBCUTANEOUS | Status: DC
  Administered 2013-04-01 – 2013-04-04 (×3): 40 mg via SUBCUTANEOUS
  Filled 2013-04-01 (×4): qty 0.4

## 2013-04-01 MED ORDER — SODIUM CHLORIDE 0.9 % IV SOLN
3.0000 g | Freq: Four times a day (QID) | INTRAVENOUS | Status: DC
  Administered 2013-04-01 – 2013-04-04 (×10): 3 g via INTRAVENOUS
  Filled 2013-04-01 (×13): qty 3

## 2013-04-01 NOTE — ED Provider Notes (Signed)
CSN: [id number]     Arrival date & time 04/01/13  1109 History   First MD Initiated Contact with Patient 04/01/13 1118     Chief Complaint  Patient presents with  . Abdominal Pain  . Emesis     (Consider location/radiation/quality/duration/timing/severity/associated sxs/prior Treatment) The history is provided by the patient. No language interpreter was used.  Brendan [name] is a 58 y/o M with PMHx of DM and appendectomy presenting to the ED with abdominal pain, nausea, vomiting that started abruptly Saturday night after the patient drank 4 beers. Patient reported that the pain is localized to the right upper quadrant described as a soreness that has a sharp pain with motion. Stated that he has been unable to keep any food or fluids down. Stated that he has had at least 15 episodes of emesis on Sunday and 5 episodes of emesis yesterday. Patient reported that he was given a stomach pill from his wife, cannot recall the name, and stated that it did not alleviate any of the pain. Patient reported that he has been feeling mild chest discomfort. Reported that the chest pain worsens with laying down reported that it is a feeling of indigestion. Patient reported that he has been feeling fatigued and weak. Reported that similar symptoms occurred approximately 3 weeks ago and that was able to shake it off. Denies shortness of breath, difficulty breathing, diarrhea, melena, hematochezia, fever, chills, sweats, diaphoresis, dizziness, headache, visual distortions, numbness, tingling. Reported he drinks alcohol a least a 6-12 pack per week. Denied cigarette use, marijuana, heroine cocaine. PCP Dr. Lacinda Axon  Past Medical History  Diagnosis Date  . Diabetes mellitus without complication    Past Surgical History  Procedure Laterality Date  . Appendectomy     No family history on file. History  Substance Use Topics  . Smoking status: Never Smoker   . Smokeless tobacco: Not on file  . Alcohol Use: Yes   Comment: occ    Review of Systems  Constitutional: Negative for fever and chills.  Eyes: Negative for visual disturbance.  Respiratory: Negative for chest tightness and shortness of breath.   Cardiovascular: Positive for chest pain.  Gastrointestinal: Positive for nausea, vomiting and abdominal pain. Negative for diarrhea, constipation, blood in stool and anal bleeding.  Genitourinary: Negative for decreased urine volume.  Musculoskeletal: Negative for back pain and neck pain.  Neurological: Positive for weakness. Negative for dizziness and numbness.  All other systems reviewed and are negative.      Allergies  Review of patient's allergies indicates no known allergies.  Home Medications   Current Outpatient Rx  Name  Route  Sig  Dispense  Refill  . Blood Glucose Monitoring Suppl (RELION PRIME MONITOR) DEVI   Does not apply   1 Device by Does not apply route as directed.   1 Device   0   . glipiZIDE (GLUCOTROL) 5 MG tablet   Oral   Take 1 tablet (5 mg total) by mouth 2 (two) times daily before a meal.   60 tablet   3   . glucose blood (RELION PRIME TEST) test strip      Use to test blood sugar up to 4 times daily.   100 each   3   . metFORMIN (GLUCOPHAGE) 500 MG tablet   Oral   Take 1 tablet (500 mg total) by mouth 2 (two) times daily with a meal.   60 tablet   3   . ReliOn Ultra Thin Lancets MISC  Use to test blood sugar up to 4 times daily.   210 each   3    BP 143/84  Pulse 110  Temp(Src) 98.1 F (36.7 C) (Oral)  Resp 19  Ht 5\' 10"  (1.778 m)  Wt 145 lb (65.772 kg)  BMI 20.81 kg/m2  SpO2 100% Physical Exam  Nursing note and vitals reviewed. Constitutional: He is oriented to person, place, and time. He appears well-developed and well-nourished. No distress.  HENT:  Head: Normocephalic and atraumatic.  Mouth/Throat: No oropharyngeal exudate.  Dry mucus membranes  Eyes: Conjunctivae and EOM are normal. Pupils are equal, round, and reactive to  light. Right eye exhibits no discharge. Left eye exhibits no discharge.  Neck: Normal range of motion. Neck supple. No tracheal deviation present.  Negative neck stiffness Negative nuchal rigidity Negative cervical lymphadenopathy  Cardiovascular: Regular rhythm and normal heart sounds.  Exam reveals no friction rub.   No murmur heard. Tachycardia upon auscultation   Pulmonary/Chest: Effort normal and breath sounds normal. No respiratory distress. He has no wheezes. He has no rales.  Abdominal: Normal appearance and bowel sounds are normal. He exhibits no fluid wave, no ascites and no pulsatile midline mass. There is tenderness in the right upper quadrant. There is guarding and positive Murphy's sign. There is no rigidity, no CVA tenderness and no tenderness at McBurney's point.    Negative abdominal distension Soft upon palpation  RUQ pain - positive Murphy's sign  Musculoskeletal: Normal range of motion.  Full ROM to upper and lower extremities without difficulty noted, negative ataxia noted.  Lymphadenopathy:    He has no cervical adenopathy.  Neurological: He is alert and oriented to person, place, and time. No cranial nerve deficit. He exhibits normal muscle tone. Coordination normal.  Cranial nerves III-XII grossly intact Strength 5+/5+ to upper and lower extremities bilaterally with resistance applied, equal distribution noted Equal grip strength  Skin: Skin is warm and dry. No rash noted. He is not diaphoretic. No erythema.  Psychiatric: He has a normal mood and affect. His behavior is normal. Thought content normal.    ED Course  Procedures (including critical care time)  Results for orders placed during the hospital encounter of 04/01/13  COMPREHENSIVE METABOLIC PANEL      Result Value Ref Range   Sodium 131 (*) 137 - 147 mEq/L   Potassium 4.3  3.7 - 5.3 mEq/L   Chloride 87 (*) 96 - 112 mEq/L   CO2 22  19 - 32 mEq/L   Glucose, Bld 341 (*) 70 - 99 mg/dL   BUN 18  6 - 23  mg/dL   Creatinine, Ser 0.62  0.50 - 1.35 mg/dL   Calcium 9.4  8.4 - 10.5 mg/dL   Total Protein 7.2  6.0 - 8.3 g/dL   Albumin 3.4 (*) 3.5 - 5.2 g/dL   AST 156 (*) 0 - 37 U/L   ALT 334 (*) 0 - 53 U/L   Alkaline Phosphatase 330 (*) 39 - 117 U/L   Total Bilirubin 7.1 (*) 0.3 - 1.2 mg/dL   GFR calc non Af Amer >90  >90 mL/min   GFR calc Af Amer >90  >90 mL/min  CBC WITH DIFFERENTIAL      Result Value Ref Range   WBC 16.3 (*) 4.0 - 10.5 K/uL   RBC 4.70  4.22 - 5.81 MIL/uL   Hemoglobin 14.7  13.0 - 17.0 g/dL   HCT 40.8  39.0 - 52.0 %   MCV 86.8  78.0 -  100.0 fL   MCH 31.3  26.0 - 34.0 pg   MCHC 36.0  30.0 - 36.0 g/dL   RDW 12.0  11.5 - 15.5 %   Platelets 248  150 - 400 K/uL   Neutrophils Relative % 92 (*) 43 - 77 %   Neutro Abs 14.9 (*) 1.7 - 7.7 K/uL   Lymphocytes Relative 5 (*) 12 - 46 %   Lymphs Abs 0.9  0.7 - 4.0 K/uL   Monocytes Relative 3  3 - 12 %   Monocytes Absolute 0.5  0.1 - 1.0 K/uL   Eosinophils Relative 0  0 - 5 %   Eosinophils Absolute 0.0  0.0 - 0.7 K/uL   Basophils Relative 0  0 - 1 %   Basophils Absolute 0.0  0.0 - 0.1 K/uL  LIPASE, BLOOD      Result Value Ref Range   Lipase 17  11 - 59 U/L  URINALYSIS, ROUTINE W REFLEX MICROSCOPIC      Result Value Ref Range   Color, Urine ORANGE (*) YELLOW   APPearance CLEAR  CLEAR   Specific Gravity, Urine 1.038 (*) 1.005 - 1.030   pH 5.0  5.0 - 8.0   Glucose, UA >1000 (*) NEGATIVE mg/dL   Hgb urine dipstick NEGATIVE  NEGATIVE   Bilirubin Urine LARGE (*) NEGATIVE   Ketones, ur >80 (*) NEGATIVE mg/dL   Protein, ur NEGATIVE  NEGATIVE mg/dL   Urobilinogen, UA 0.2  0.0 - 1.0 mg/dL   Nitrite NEGATIVE  NEGATIVE   Leukocytes, UA NEGATIVE  NEGATIVE  TROPONIN I      Result Value Ref Range   Troponin I <0.30  <0.30 ng/mL  URINE MICROSCOPIC-ADD ON      Result Value Ref Range   Squamous Epithelial / LPF RARE  RARE   WBC, UA 0-2  <3 WBC/hpf   RBC / HPF 0-2  <3 RBC/hpf   Bacteria, UA FEW (*) RARE    Labs Review Labs  Reviewed  COMPREHENSIVE METABOLIC PANEL - Abnormal; Notable for the following:    Sodium 131 (*)    Chloride 87 (*)    Glucose, Bld 341 (*)    Albumin 3.4 (*)    AST 156 (*)    ALT 334 (*)    Alkaline Phosphatase 330 (*)    Total Bilirubin 7.1 (*)    All other components within normal limits  CBC WITH DIFFERENTIAL - Abnormal; Notable for the following:    WBC 16.3 (*)    Neutrophils Relative % 92 (*)    Neutro Abs 14.9 (*)    Lymphocytes Relative 5 (*)    All other components within normal limits  URINALYSIS, ROUTINE W REFLEX MICROSCOPIC - Abnormal; Notable for the following:    Color, Urine ORANGE (*)    Specific Gravity, Urine 1.038 (*)    Glucose, UA >1000 (*)    Bilirubin Urine LARGE (*)    Ketones, ur >80 (*)    All other components within normal limits  URINE MICROSCOPIC-ADD ON - Abnormal; Notable for the following:    Bacteria, UA FEW (*)    All other components within normal limits  LIPASE, BLOOD  TROPONIN I  CBG MONITORING, ED   Imaging Review US Abdomen Limited Ruq  04/01/2013   CLINICAL DATA:  Abdominal pain, nausea and vomiting  EXAM: US ABDOMEN LIMITED - RIGHT UPPER QUADRANT  COMPARISON:  None.  FINDINGS: Gallbladder:  There is echogenicity within the gallbladder representing both gallbladder sludge and  small gallstones with some acoustical shadowing. The gallbladder wall is thickened measuring 8.2 mm with some edema of the wall. In addition there is pain over the gallbladder with compression, findings very suggestive of acute cholecystitis. Clinical correlation is recommended.  Common bile duct:  Diameter: The common bile duct is normal measuring 3.9 mm in diameter.  Liver:  The liver has a relatively normal echogenic appearance. However, anteriorly there is a 1.4 cm hyper echogenic focus, most consistent with an incidental hemangioma.  IMPRESSION: 1. Ultrasound findings suspicious for acute cholecystitis. Correlate clinically. Gallstones and gallbladder sludge are  noted with gallbladder wall edema. 2. Probable incidental 1.4 cm liver hemangioma. 3. I discussed the findings of this study with Dr. Rogene Houston at the time of interpretation.   Electronically Signed   By: Ivar Drape M.D.   On: 04/01/2013 12:57     EKG Interpretation None      MDM   Final diagnoses:  Acute cholecystitis   Medications  0.9 %  sodium chloride infusion ( Intravenous New Bag/Given 04/01/13 1204)  ondansetron (ZOFRAN) injection 4 mg (4 mg Intravenous Given 04/01/13 1205)  ondansetron (ZOFRAN) injection 4 mg (4 mg Intravenous Given 04/01/13 1310)  HYDROmorphone (DILAUDID) injection 1 mg (1 mg Intravenous Given 04/01/13 1310)   Filed Vitals:   04/01/13 1145 04/01/13 1151 04/01/13 1200 04/01/13 1300  BP: 139/80 139/80 138/63 143/84  Pulse: 106  104 110  Temp:      TempSrc:      Resp: 14 16 17 19   Height:      Weight:      SpO2: 100% 100% 98% 100%    Patient presenting to the ED with abdominal pain, nausea, vomiting that started abruptly on Saturday night. Reported abdominal pain is localized to the RUQ described as a soreness sensation worse with motion described as a sharp pain. Stated that he has been unable to keep any foods or fluids down. Stated that he has been having mild chest discomfort described as an indigestion feeling. Reported that he had similar symptoms approximately 3 weeks ago that he was able to shake off. Reported that he drinks approximately 6-12 pack of beer per week.   Alert and oriented. GCS 15. Heart rate noted to be mildly tachycardic upon auscultation with normal rhythm. Radial and DP pulses 2+ bilaterally. Lungs clear to auscultation to upper and lower lobes. Bowel sounds normoactive in all 4 quadrants, soft upon palpation. Discomfort upon palpation to the right upper quadrant and epigastric region-most discomfort to right upper quadrant. Positive Murphy sign. EKG noted to be sinus tachycardia with heart rate of 15 bpm with nonspecific T wave  abnormality. Troponin negative elevation. CBC noted elevated WBC of 16.3 with elevated neutrophil. CMP noted elevated AST and ALT-AST 156, ALT 334, patient has history of alcohol abuse. Alkaline phosphatase elevated at 330. Urinalysis noted large bilirubin-negative leukocytes or nitrites identified-negative findings of urinary tract infection. Right upper quadrant ultrasound identified suspicion for acute cholecystitis-gallstones and gallbladder sludge are noted with gallbladder wall edema. Dr. Rogene Houston spoke with general surgery-general surgery to see and assess patient. Patient to be admitted for acute cholecystitis. Since elevated alkaline phosphatase cannot rule out possible common bile obstruction. Patient stable, afebrile-mildly tachycardic. Patient stable for transfer.  Jamse Mead, PA-C 04/01/13 2348
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
## 2013-04-01 NOTE — ED Notes (Signed)
Attempted to call report to 6N, was told receiving RN unaware of getting pt and will call right back.

## 2013-04-01 NOTE — H&P (Signed)
Pt seen and examined.  Ask GI to see.  Check LFT in am.  May  need ERCP preop depending on LFT.  NPO and IV abx.

## 2013-04-01 NOTE — Progress Notes (Signed)
Unit CM UR Completed by [facility] ED CM  W. Tashala Cumbo RN  

## 2013-04-01 NOTE — Discharge Planning (Signed)
[id number] Brendan [name], Community Liaison  Spoke with patient about primary care resources and establishing care with a provider. Patient states he is being seen by [facility] and is in the process of renewing his orange card. My contact information was provided for any future questions or concerns

## 2013-04-01 NOTE — Consult Note (Signed)
Patient seen, examined, and I agree with the above documentation, including the assessment and plan. Patient admitted with right upper quadrant pain and ultrasound suspicious for acute cholecystitis. His liver enzymes are elevated, both hepatocellular and cholestatic pattern. CBD was not dilated by ultrasound and my suspicion for CBD obstruction/stone is low. Elevated liver enzymes could all be secondary to cholecystitis MRCP ordered to exclude CBD stone. If positive would need ERCP otherwise cholecystectomy per surgery Pain control and IV ABX Discussed with patient and family

## 2013-04-01 NOTE — ED Provider Notes (Signed)
Medical screening examination/treatment/procedure(s) were conducted as a shared visit with non-physician practitioner(s) and myself.  I personally evaluated the patient during the encounter.   EKG Interpretation None      Results for orders placed during the hospital encounter of 04/01/13  COMPREHENSIVE METABOLIC PANEL      Result Value Ref Range   Sodium 131 (*) 137 - 147 mEq/L   Potassium 4.3  3.7 - 5.3 mEq/L   Chloride 87 (*) 96 - 112 mEq/L   CO2 22  19 - 32 mEq/L   Glucose, Bld 341 (*) 70 - 99 mg/dL   BUN 18  6 - 23 mg/dL   Creatinine, Ser 0.62  0.50 - 1.35 mg/dL   Calcium 9.4  8.4 - 10.5 mg/dL   Total Protein 7.2  6.0 - 8.3 g/dL   Albumin 3.4 (*) 3.5 - 5.2 g/dL   AST 156 (*) 0 - 37 U/L   ALT 334 (*) 0 - 53 U/L   Alkaline Phosphatase 330 (*) 39 - 117 U/L   Total Bilirubin 7.1 (*) 0.3 - 1.2 mg/dL   GFR calc non Af Amer >90  >90 mL/min   GFR calc Af Amer >90  >90 mL/min  CBC WITH DIFFERENTIAL      Result Value Ref Range   WBC 16.3 (*) 4.0 - 10.5 K/uL   RBC 4.70  4.22 - 5.81 MIL/uL   Hemoglobin 14.7  13.0 - 17.0 g/dL   HCT 40.8  39.0 - 52.0 %   MCV 86.8  78.0 - 100.0 fL   MCH 31.3  26.0 - 34.0 pg   MCHC 36.0  30.0 - 36.0 g/dL   RDW 12.0  11.5 - 15.5 %   Platelets 248  150 - 400 K/uL   Neutrophils Relative % 92 (*) 43 - 77 %   Neutro Abs 14.9 (*) 1.7 - 7.7 K/uL   Lymphocytes Relative 5 (*) 12 - 46 %   Lymphs Abs 0.9  0.7 - 4.0 K/uL   Monocytes Relative 3  3 - 12 %   Monocytes Absolute 0.5  0.1 - 1.0 K/uL   Eosinophils Relative 0  0 - 5 %   Eosinophils Absolute 0.0  0.0 - 0.7 K/uL   Basophils Relative 0  0 - 1 %   Basophils Absolute 0.0  0.0 - 0.1 K/uL  LIPASE, BLOOD      Result Value Ref Range   Lipase 17  11 - 59 U/L  URINALYSIS, ROUTINE W REFLEX MICROSCOPIC      Result Value Ref Range   Color, Urine ORANGE (*) YELLOW   APPearance CLEAR  CLEAR   Specific Gravity, Urine 1.038 (*) 1.005 - 1.030   pH 5.0  5.0 - 8.0   Glucose, UA >1000 (*) NEGATIVE mg/dL   Hgb  urine dipstick NEGATIVE  NEGATIVE   Bilirubin Urine LARGE (*) NEGATIVE   Ketones, ur >80 (*) NEGATIVE mg/dL   Protein, ur NEGATIVE  NEGATIVE mg/dL   Urobilinogen, UA 0.2  0.0 - 1.0 mg/dL   Nitrite NEGATIVE  NEGATIVE   Leukocytes, UA NEGATIVE  NEGATIVE  TROPONIN I      Result Value Ref Range   Troponin I <0.30  <0.30 ng/mL  URINE MICROSCOPIC-ADD ON      Result Value Ref Range   Squamous Epithelial / LPF RARE  RARE   WBC, UA 0-2  <3 WBC/hpf   RBC / HPF 0-2  <3 RBC/hpf   Bacteria, UA FEW (*) RARE  US Abdomen Limited Ruq  04/01/2013   CLINICAL DATA:  Abdominal pain, nausea and vomiting  EXAM: US ABDOMEN LIMITED - RIGHT UPPER QUADRANT  COMPARISON:  None.  FINDINGS: Gallbladder:  There is echogenicity within the gallbladder representing both gallbladder sludge and small gallstones with some acoustical shadowing. The gallbladder wall is thickened measuring 8.2 mm with some edema of the wall. In addition there is pain over the gallbladder with compression, findings very suggestive of acute cholecystitis. Clinical correlation is recommended.  Common bile duct:  Diameter: The common bile duct is normal measuring 3.9 mm in diameter.  Liver:  The liver has a relatively normal echogenic appearance. However, anteriorly there is a 1.4 cm hyper echogenic focus, most consistent with an incidental hemangioma.  IMPRESSION: 1. Ultrasound findings suspicious for acute cholecystitis. Correlate clinically. Gallstones and gallbladder sludge are noted with gallbladder wall edema. 2. Probable incidental 1.4 cm liver hemangioma. 3. I discussed the findings of this study with Dr. Rogene Houston at the time of interpretation.   Electronically Signed   By: Ivar Drape M.D.   On: 04/01/2013 12:57    The patient seen by me. Patient with 2 day history of right upper quadrant abdominal pain. Patient's had the patient seen by me. Patient with 2 day history of right upper quadrant abdominal pain has been getting progressively worse.  Patient her leukocytosis ultrasound consistent with acute cholecystitis we'll treat with pain medicine and discussed with general surgery. Lipase is not elevated abnormal liver function test to include elevated bilirubin. Sternal surgery may want an ERCP done first.   Mervin Kung, MD 04/01/13 1302

## 2013-04-01 NOTE — H&P (Signed)
PCP: Family Medicine, Dr. Lacinda Axon  Chief Complaint: RUQ abdominal pain  HPI: Brendan [name] is a 58 year old male with a history of diabetes mellitus who presents to [facility] with RUQ abdominal pain.  Duration of symptoms is 3 days. Onset was Saturday after eating a hamburger from [employer] and consuming 4 beers.  Characterized as "want to puke type of pain."  Severe in severity.  Time pattern is constant.  Associated with chills, nausea and vomiting.  Also complains of reflux symptoms.  Modifying factors include; phenergan and a PPI. No aggravating factors.  Alleviated by phenergan.  Previous symptoms approximately 3 weeks ago after consuming 4 beers as well.  He reports a poor appetite since Saturday.  CBGs have been elevated.  Abdominal US shows a normal CBD, suspicious for acute cholecystitis.  White count of 16K.  Normal lipase.  Elevated LFTs including a bilirubin of 7.1.  He reports occasional alcohol use.    Past Medical History  Diagnosis Date  . Diabetes mellitus without complication     Past Surgical History  Procedure Laterality Date  . Appendectomy      Family History  Problem Relation Age of Onset  . Colon cancer Maternal Aunt   . Diabetes Maternal Grandmother   . Stroke Maternal Grandmother    Social History:  reports that he has never smoked. He does not have any smokeless tobacco history on file. He reports that he drinks alcohol. He reports that he does not use illicit drugs.  Allergies: No Known Allergies  Medication: Scheduled Meds: . enoxaparin (LOVENOX) injection  40 mg Subcutaneous Q24H  . insulin aspart  0-15 Units Subcutaneous TID WC  . pantoprazole  40 mg Oral Q1200   Continuous Infusions: . sodium chloride 125 mL/hr at 04/01/13 1204  . sodium chloride    . ampicillin-sulbactam (UNASYN) IV     PRN Meds:.acetaminophen, acetaminophen, HYDROcodone-acetaminophen, morphine injection, ondansetron  (Not in a hospital admission)  Results for orders placed during  the hospital encounter of 04/01/13 (from the past 48 hour(s))  COMPREHENSIVE METABOLIC PANEL     Status: Abnormal   Collection Time    04/01/13 11:27 AM      Result Value Ref Range   Sodium 131 (*) 137 - 147 mEq/L   Potassium 4.3  3.7 - 5.3 mEq/L   Chloride 87 (*) 96 - 112 mEq/L   CO2 22  19 - 32 mEq/L   Glucose, Bld 341 (*) 70 - 99 mg/dL   BUN 18  6 - 23 mg/dL   Creatinine, Ser 0.62  0.50 - 1.35 mg/dL   Calcium 9.4  8.4 - 10.5 mg/dL   Total Protein 7.2  6.0 - 8.3 g/dL   Albumin 3.4 (*) 3.5 - 5.2 g/dL   AST 156 (*) 0 - 37 U/L   ALT 334 (*) 0 - 53 U/L   Alkaline Phosphatase 330 (*) 39 - 117 U/L   Total Bilirubin 7.1 (*) 0.3 - 1.2 mg/dL   GFR calc non Af Amer >90  >90 mL/min   GFR calc Af Amer >90  >90 mL/min   Comment: (NOTE)     The eGFR has been calculated using the CKD EPI equation.     This calculation has not been validated in all clinical situations.     eGFR's persistently <90 mL/min signify possible Chronic Kidney     Disease.  CBC WITH DIFFERENTIAL     Status: Abnormal   Collection Time    04/01/13 11:27 AM  Result Value Ref Range   WBC 16.3 (*) 4.0 - 10.5 K/uL   RBC 4.70  4.22 - 5.81 MIL/uL   Hemoglobin 14.7  13.0 - 17.0 g/dL   HCT 40.8  39.0 - 52.0 %   MCV 86.8  78.0 - 100.0 fL   MCH 31.3  26.0 - 34.0 pg   MCHC 36.0  30.0 - 36.0 g/dL   RDW 12.0  11.5 - 15.5 %   Platelets 248  150 - 400 K/uL   Neutrophils Relative % 92 (*) 43 - 77 %   Neutro Abs 14.9 (*) 1.7 - 7.7 K/uL   Lymphocytes Relative 5 (*) 12 - 46 %   Lymphs Abs 0.9  0.7 - 4.0 K/uL   Monocytes Relative 3  3 - 12 %   Monocytes Absolute 0.5  0.1 - 1.0 K/uL   Eosinophils Relative 0  0 - 5 %   Eosinophils Absolute 0.0  0.0 - 0.7 K/uL   Basophils Relative 0  0 - 1 %   Basophils Absolute 0.0  0.0 - 0.1 K/uL  LIPASE, BLOOD     Status: None   Collection Time    04/01/13 11:27 AM      Result Value Ref Range   Lipase 17  11 - 59 U/L  TROPONIN I     Status: None   Collection Time    04/01/13 11:27 AM       Result Value Ref Range   Troponin I <0.30  <0.30 ng/mL   Comment:            Due to the release kinetics of cTnI,     a negative result within the first hours     of the onset of symptoms does not rule out     myocardial infarction with certainty.     If myocardial infarction is still suspected,     repeat the test at appropriate intervals.  URINALYSIS, ROUTINE W REFLEX MICROSCOPIC     Status: Abnormal   Collection Time    04/01/13 11:55 AM      Result Value Ref Range   Color, Urine ORANGE (*) YELLOW   Comment: BIOCHEMICALS MAY BE AFFECTED BY COLOR   APPearance CLEAR  CLEAR   Specific Gravity, Urine 1.038 (*) 1.005 - 1.030   pH 5.0  5.0 - 8.0   Glucose, UA >1000 (*) NEGATIVE mg/dL   Hgb urine dipstick NEGATIVE  NEGATIVE   Bilirubin Urine LARGE (*) NEGATIVE   Ketones, ur >80 (*) NEGATIVE mg/dL   Protein, ur NEGATIVE  NEGATIVE mg/dL   Urobilinogen, UA 0.2  0.0 - 1.0 mg/dL   Nitrite NEGATIVE  NEGATIVE   Leukocytes, UA NEGATIVE  NEGATIVE  URINE MICROSCOPIC-ADD ON     Status: Abnormal   Collection Time    04/01/13 11:55 AM      Result Value Ref Range   Squamous Epithelial / LPF RARE  RARE   WBC, UA 0-2  <3 WBC/hpf   RBC / HPF 0-2  <3 RBC/hpf   Bacteria, UA FEW (*) RARE   US Abdomen Limited Ruq  04/01/2013   CLINICAL DATA:  Abdominal pain, nausea and vomiting  EXAM: US ABDOMEN LIMITED - RIGHT UPPER QUADRANT  COMPARISON:  None.  FINDINGS: Gallbladder:  There is echogenicity within the gallbladder representing both gallbladder sludge and small gallstones with some acoustical shadowing. The gallbladder wall is thickened measuring 8.2 mm with some edema of the wall. In addition there is pain over  the gallbladder with compression, findings very suggestive of acute cholecystitis. Clinical correlation is recommended.  Common bile duct:  Diameter: The common bile duct is normal measuring 3.9 mm in diameter.  Liver:  The liver has a relatively normal echogenic appearance. However,  anteriorly there is a 1.4 cm hyper echogenic focus, most consistent with an incidental hemangioma.  IMPRESSION: 1. Ultrasound findings suspicious for acute cholecystitis. Correlate clinically. Gallstones and gallbladder sludge are noted with gallbladder wall edema. 2. Probable incidental 1.4 cm liver hemangioma. 3. I discussed the findings of this study with Dr. Rogene Houston at the time of interpretation.   Electronically Signed   By: Ivar Drape M.D.   On: 04/01/2013 12:57    Review of Systems  All other systems reviewed and are negative.    Blood pressure 147/75, pulse 107, temperature 98.1 F (36.7 C), temperature source Oral, resp. rate 21, height 5' 10"  (1.778 m), weight 145 lb (65.772 kg), SpO2 97.00%. Physical Exam  Constitutional: He is oriented to person, place, and time. He appears well-developed and well-nourished. No distress.  HENT:  Head: Normocephalic and atraumatic.  Mouth/Throat: No oropharyngeal exudate.  Eyes: EOM are normal. Scleral icterus is present.  Neck: Normal range of motion. Neck supple.  Cardiovascular: Normal rate, regular rhythm, normal heart sounds and intact distal pulses.   Respiratory: Effort normal and breath sounds normal. No respiratory distress. He has no wheezes. He has no rales. He exhibits no tenderness.  GI: Soft. Bowel sounds are normal. He exhibits no distension. There is no guarding.  RUQ tenderness  Musculoskeletal: Normal range of motion. He exhibits no edema and no tenderness.  Lymphadenopathy:    He has no cervical adenopathy.  Neurological: He is alert and oriented to person, place, and time.  Skin: Skin is warm and dry. No rash noted. He is not diaphoretic. No erythema. No pallor.  Psychiatric: He has a normal mood and affect. His behavior is normal. Judgment and thought content normal.     Assessment/Plan Acute cholecystitis transaminitis Admit for IV antibiotics, pain control, laparoscopic cholecystectomy at a later date Ampicillin   Consult to GI IVF NPO Pain control, antiemetics Repeat labs in AM SCDs, lovenox PPI for GERD  Diabetes mellitus -SSI, CBGs -last A1C 2/14 12.  Recheck.  -hold home meds   Erby Pian ANP-BC Pager [phone] 04/01/2013, 1:57 PM

## 2013-04-01 NOTE — ED Notes (Signed)
Pt is here with right lateral mid to upper abdominal pain and reports vomiting associated with 2 day history.

## 2013-04-01 NOTE — ED Provider Notes (Signed)
Date: 04/01/2013  Rate: 115  Rhythm: sinus tachycardia  QRS Axis: normal  Intervals: normal  ST/T Wave abnormalities: nonspecific T wave changes  Conduction Disutrbances:none  Narrative Interpretation:   Old EKG Reviewed: none available  Did not cross into MUSE  Mervin Kung, MD 04/01/13 [phone]

## 2013-04-01 NOTE — ED Notes (Signed)
General surgery at bedside. 

## 2013-04-02 ENCOUNTER — Encounter (HOSPITAL_COMMUNITY)
Admission: EM | Disposition: A | Discharge status: Home / Self Care | Payer: Self-pay | Source: Home / Self Care | Attending: Emergency Medicine

## 2013-04-02 ENCOUNTER — Observation Stay (HOSPITAL_COMMUNITY): Payer: Self-pay | Admitting: Anesthesiology

## 2013-04-02 ENCOUNTER — Encounter (HOSPITAL_COMMUNITY): Payer: MEDICAID | Admitting: Anesthesiology

## 2013-04-02 ENCOUNTER — Encounter (HOSPITAL_COMMUNITY): Payer: Self-pay | Admitting: Anesthesiology

## 2013-04-02 ENCOUNTER — Observation Stay (HOSPITAL_COMMUNITY): Payer: Self-pay

## 2013-04-02 DIAGNOSIS — K810 Acute cholecystitis: Secondary | ICD-10-CM

## 2013-04-02 DIAGNOSIS — K8050 Calculus of bile duct without cholangitis or cholecystitis without obstruction: Secondary | ICD-10-CM

## 2013-04-02 HISTORY — PX: CHOLECYSTECTOMY: SHX55

## 2013-04-02 HISTORY — PX: ERCP: SHX5425

## 2013-04-02 LAB — COMPREHENSIVE METABOLIC PANEL
ALK PHOS: 232 U/L — AB (ref 39–117)
ALT: 186 U/L — AB (ref 0–53)
AST: 45 U/L — AB (ref 0–37)
Albumin: 2.7 g/dL — ABNORMAL LOW (ref 3.5–5.2)
BUN: 24 mg/dL — ABNORMAL HIGH (ref 6–23)
CALCIUM: 8.9 mg/dL (ref 8.4–10.5)
CO2: 24 mEq/L (ref 19–32)
Chloride: 94 mEq/L — ABNORMAL LOW (ref 96–112)
Creatinine, Ser: 0.59 mg/dL (ref 0.50–1.35)
GFR calc non Af Amer: >90 mL/min (ref >90)
GLUCOSE: 242 mg/dL — AB (ref 70–99)
POTASSIUM: 4.4 meq/L (ref 3.7–5.3)
SODIUM: 133 meq/L — AB (ref 137–147)
Total Bilirubin: 4.5 mg/dL — ABNORMAL HIGH (ref 0.3–1.2)
Total Protein: 5.9 g/dL — ABNORMAL LOW (ref 6.0–8.3)

## 2013-04-02 LAB — GLUCOSE, CAPILLARY
GLUCOSE-CAPILLARY: 270 mg/dL — AB (ref 70–99)
GLUCOSE-CAPILLARY: 200 mg/dL — AB (ref 70–99)
Glucose-Capillary: 262 mg/dL — ABNORMAL HIGH (ref 70–99)
Glucose-Capillary: 249 mg/dL — ABNORMAL HIGH (ref 70–99)
Glucose-Capillary: 180 mg/dL — ABNORMAL HIGH (ref 70–99)
Glucose-Capillary: 179 mg/dL — ABNORMAL HIGH (ref 70–99)

## 2013-04-02 LAB — CBC
HCT: 34.7 % — ABNORMAL LOW (ref 39.0–52.0)
HEMOGLOBIN: 12.4 g/dL — AB (ref 13.0–17.0)
MCH: 30.8 pg (ref 26.0–34.0)
MCHC: 35.7 g/dL (ref 30.0–36.0)
MCV: 86.3 fL (ref 78.0–100.0)
PLATELETS: 168 10*3/uL (ref 150–400)
RBC: 4.02 MIL/uL — ABNORMAL LOW (ref 4.22–5.81)
RDW: 11.9 % (ref 11.5–15.5)
WBC: 13.5 10*3/uL — ABNORMAL HIGH (ref 4.0–10.5)

## 2013-04-02 LAB — SURGICAL PCR SCREEN
MRSA, PCR: NEGATIVE
Staphylococcus aureus: NEGATIVE

## 2013-04-02 SURGERY — ERCP, WITH INTERVENTION IF INDICATED
Anesthesia: General

## 2013-04-02 SURGERY — LAPAROSCOPIC CHOLECYSTECTOMY WITH INTRAOPERATIVE CHOLANGIOGRAM
Anesthesia: General | Site: Abdomen

## 2013-04-02 MED ORDER — SODIUM CHLORIDE 0.9 % IJ SOLN
INTRAMUSCULAR | Status: AC
  Filled 2013-04-02: qty 3

## 2013-04-02 MED ORDER — GLYCOPYRROLATE 0.2 MG/ML IJ SOLN
INTRAMUSCULAR | Status: DC | PRN
  Administered 2013-04-02: 0.4 mg via INTRAVENOUS

## 2013-04-02 MED ORDER — OXYCODONE-ACETAMINOPHEN 5-325 MG PO TABS
1.0000 | ORAL_TABLET | ORAL | Status: DC | PRN
  Administered 2013-04-03: 1 via ORAL
  Filled 2013-04-02 (×2): qty 1

## 2013-04-02 MED ORDER — BUPIVACAINE-EPINEPHRINE (PF) 0.25% -1:200000 IJ SOLN
INTRAMUSCULAR | Status: AC
  Filled 2013-04-02: qty 30

## 2013-04-02 MED ORDER — FENTANYL CITRATE 0.05 MG/ML IJ SOLN
INTRAMUSCULAR | Status: AC
  Filled 2013-04-02: qty 5

## 2013-04-02 MED ORDER — ONDANSETRON HCL 4 MG/2ML IJ SOLN
INTRAMUSCULAR | Status: AC
  Filled 2013-04-02: qty 2

## 2013-04-02 MED ORDER — NEOSTIGMINE METHYLSULFATE 1 MG/ML IJ SOLN
INTRAMUSCULAR | Status: DC | PRN
  Administered 2013-04-02: 3 mg via INTRAVENOUS

## 2013-04-02 MED ORDER — LACTATED RINGERS IV SOLN
INTRAVENOUS | Status: DC | PRN
  Administered 2013-04-02: 08:00:00 via INTRAVENOUS

## 2013-04-02 MED ORDER — CEFAZOLIN SODIUM-DEXTROSE 2-3 GM-% IV SOLR
INTRAVENOUS | Status: DC | PRN
  Administered 2013-04-02: 2 g via INTRAVENOUS

## 2013-04-02 MED ORDER — LIDOCAINE HCL (CARDIAC) 20 MG/ML IV SOLN
INTRAVENOUS | Status: DC | PRN
Start: 2013-04-02 — End: 2013-04-02
  Administered 2013-04-02: 40 mg via INTRAVENOUS

## 2013-04-02 MED ORDER — OXYCODONE HCL 5 MG/5ML PO SOLN: 5.0000 mg | Freq: Once | ORAL | Status: DC | PRN

## 2013-04-02 MED ORDER — CEFAZOLIN SODIUM-DEXTROSE 2-3 GM-% IV SOLR
INTRAVENOUS | Status: AC
  Filled 2013-04-02: qty 50

## 2013-04-02 MED ORDER — FENTANYL CITRATE 0.05 MG/ML IJ SOLN
INTRAMUSCULAR | Status: DC | PRN
Start: 2013-04-02 — End: 2013-04-02
  Administered 2013-04-02 (×2): 50 ug via INTRAVENOUS
  Administered 2013-04-02: 100 ug via INTRAVENOUS
  Administered 2013-04-02: 50 ug via INTRAVENOUS

## 2013-04-02 MED ORDER — OXYCODONE HCL 5 MG PO TABS: 5.0000 mg | ORAL_TABLET | Freq: Once | ORAL | Status: DC | PRN

## 2013-04-02 MED ORDER — MIDAZOLAM HCL 2 MG/2ML IJ SOLN
INTRAMUSCULAR | Status: AC
  Filled 2013-04-02: qty 2

## 2013-04-02 MED ORDER — SODIUM CHLORIDE 0.9 % IV SOLN
INTRAVENOUS | Status: DC | PRN
  Administered 2013-04-02: 09:00:00

## 2013-04-02 MED ORDER — SODIUM CHLORIDE 0.9 % IR SOLN
Status: DC | PRN
  Administered 2013-04-02 (×3): 1000 mL

## 2013-04-02 MED ORDER — FENTANYL CITRATE 0.05 MG/ML IJ SOLN: 50.0000 ug | Freq: Once | INTRAMUSCULAR | Status: DC

## 2013-04-02 MED ORDER — ONDANSETRON HCL 4 MG/2ML IJ SOLN
INTRAMUSCULAR | Status: DC | PRN
  Administered 2013-04-02: 4 mg via INTRAVENOUS

## 2013-04-02 MED ORDER — ROCURONIUM BROMIDE 50 MG/5ML IV SOLN
INTRAVENOUS | Status: AC
  Filled 2013-04-02: qty 1

## 2013-04-02 MED ORDER — LACTATED RINGERS IV SOLN
INTRAVENOUS | Status: DC | PRN
  Administered 2013-04-02: 18:00:00 via INTRAVENOUS

## 2013-04-02 MED ORDER — SUCCINYLCHOLINE CHLORIDE 20 MG/ML IJ SOLN
INTRAMUSCULAR | Status: AC
  Filled 2013-04-02: qty 1

## 2013-04-02 MED ORDER — PHENYLEPHRINE HCL 10 MG/ML IJ SOLN
INTRAMUSCULAR | Status: DC | PRN
  Administered 2013-04-02: 80 ug via INTRAVENOUS

## 2013-04-02 MED ORDER — PROPOFOL 10 MG/ML IV BOLUS
INTRAVENOUS | Status: AC
  Filled 2013-04-02: qty 20

## 2013-04-02 MED ORDER — DEXAMETHASONE SODIUM PHOSPHATE 4 MG/ML IJ SOLN
INTRAMUSCULAR | Status: AC
  Filled 2013-04-02: qty 1

## 2013-04-02 MED ORDER — HYDROMORPHONE HCL PF 1 MG/ML IJ SOLN
0.2500 mg | INTRAMUSCULAR | Status: DC | PRN
  Administered 2013-04-02: 0.5 mg via INTRAVENOUS
  Administered 2013-04-02 (×2): 0.25 mg via INTRAVENOUS
  Administered 2013-04-02: 0.5 mg via INTRAVENOUS

## 2013-04-02 MED ORDER — LIDOCAINE HCL (CARDIAC) 20 MG/ML IV SOLN
INTRAVENOUS | Status: AC
  Filled 2013-04-02: qty 5

## 2013-04-02 MED ORDER — SODIUM CHLORIDE 0.9 % IV SOLN
INTRAVENOUS | Status: DC
  Administered 2013-04-02: 12:00:00 via INTRAVENOUS

## 2013-04-02 MED ORDER — HYDROMORPHONE HCL PF 1 MG/ML IJ SOLN
INTRAMUSCULAR | Status: AC
  Filled 2013-04-02: qty 1

## 2013-04-02 MED ORDER — SODIUM CHLORIDE 0.9 % IJ SOLN
INTRAMUSCULAR | Status: AC
  Filled 2013-04-02: qty 10

## 2013-04-02 MED ORDER — GLYCOPYRROLATE 0.2 MG/ML IJ SOLN
INTRAMUSCULAR | Status: AC
  Filled 2013-04-02: qty 3

## 2013-04-02 MED ORDER — GLYCOPYRROLATE 0.2 MG/ML IJ SOLN
INTRAMUSCULAR | Status: AC
  Filled 2013-04-02: qty 2

## 2013-04-02 MED ORDER — PROMETHAZINE HCL 25 MG/ML IJ SOLN
6.2500 mg | INTRAMUSCULAR | Status: DC | PRN
  Administered 2013-04-02: 6.25 mg via INTRAVENOUS

## 2013-04-02 MED ORDER — 0.9 % SODIUM CHLORIDE (POUR BTL) OPTIME
TOPICAL | Status: DC | PRN
  Administered 2013-04-02: 1000 mL

## 2013-04-02 MED ORDER — PROPOFOL 10 MG/ML IV BOLUS
INTRAVENOUS | Status: DC | PRN
  Administered 2013-04-02: 180 mg via INTRAVENOUS

## 2013-04-02 MED ORDER — BUPIVACAINE-EPINEPHRINE 0.25% -1:200000 IJ SOLN
INTRAMUSCULAR | Status: DC | PRN
Start: 2013-04-02 — End: 2013-04-02
  Administered 2013-04-02: 30 mL

## 2013-04-02 MED ORDER — LIDOCAINE HCL (CARDIAC) 20 MG/ML IV SOLN
INTRAVENOUS | Status: DC | PRN
  Administered 2013-04-02: 80 mg via INTRAVENOUS

## 2013-04-02 MED ORDER — LACTATED RINGERS IV SOLN
INTRAVENOUS | Status: DC
  Administered 2013-04-02: 17:00:00 via INTRAVENOUS

## 2013-04-02 MED ORDER — NEOSTIGMINE METHYLSULFATE 1 MG/ML IJ SOLN
INTRAMUSCULAR | Status: AC
  Filled 2013-04-02: qty 10

## 2013-04-02 MED ORDER — EPHEDRINE SULFATE 50 MG/ML IJ SOLN
INTRAMUSCULAR | Status: AC
  Filled 2013-04-02: qty 1

## 2013-04-02 MED ORDER — HYDROMORPHONE HCL PF 1 MG/ML IJ SOLN
1.0000 mg | INTRAMUSCULAR | Status: DC | PRN
  Administered 2013-04-02 – 2013-04-04 (×9): 1 mg via INTRAVENOUS
  Filled 2013-04-02 (×9): qty 1

## 2013-04-02 MED ORDER — MIDAZOLAM HCL 5 MG/5ML IJ SOLN
INTRAMUSCULAR | Status: DC | PRN
  Administered 2013-04-02: 2 mg via INTRAVENOUS

## 2013-04-02 MED ORDER — MIDAZOLAM HCL 2 MG/2ML IJ SOLN: 1.0000 mg | INTRAMUSCULAR | Status: DC | PRN

## 2013-04-02 MED ORDER — FENTANYL CITRATE 0.05 MG/ML IJ SOLN
INTRAMUSCULAR | Status: DC | PRN
  Administered 2013-04-02: 100 ug via INTRAVENOUS

## 2013-04-02 MED ORDER — PROMETHAZINE HCL 25 MG/ML IJ SOLN
INTRAMUSCULAR | Status: AC
  Filled 2013-04-02: qty 1

## 2013-04-02 MED ORDER — PHENYLEPHRINE 40 MCG/ML (10ML) SYRINGE FOR IV PUSH (FOR BLOOD PRESSURE SUPPORT)
PREFILLED_SYRINGE | INTRAVENOUS | Status: AC
  Filled 2013-04-02: qty 10

## 2013-04-02 MED ORDER — ROCURONIUM BROMIDE 100 MG/10ML IV SOLN
INTRAVENOUS | Status: DC | PRN
  Administered 2013-04-02 (×2): 5 mg via INTRAVENOUS
  Administered 2013-04-02: 40 mg via INTRAVENOUS
  Administered 2013-04-02: 10 mg via INTRAVENOUS

## 2013-04-02 MED ORDER — SUCCINYLCHOLINE CHLORIDE 20 MG/ML IJ SOLN
INTRAMUSCULAR | Status: DC | PRN
  Administered 2013-04-02: 50 mg via INTRAVENOUS

## 2013-04-02 SURGICAL SUPPLY — 58 items
ADH SKN CLS APL DERMABOND .7 (GAUZE/BANDAGES/DRESSINGS) ×1
APPLIER CLIP ROT 10 11.4 M/L (STAPLE) ×6
APR CLP MED LRG 11.4X10 (STAPLE) ×2
BAG SPEC RTRVL LRG 6X4 10 (ENDOMECHANICALS) ×1
BLADE SURG ROTATE 9660 (MISCELLANEOUS) IMPLANT
CANISTER SUCTION 2500CC (MISCELLANEOUS) ×3 IMPLANT
CHLORAPREP W/TINT 26ML (MISCELLANEOUS) ×3 IMPLANT
CLIP APPLIE ROT 10 11.4 M/L (STAPLE) ×2 IMPLANT
COVER MAYO STAND STRL (DRAPES) ×3 IMPLANT
COVER SURGICAL LIGHT HANDLE (MISCELLANEOUS) ×3 IMPLANT
DECANTER SPIKE VIAL GLASS SM (MISCELLANEOUS) ×6 IMPLANT
DERMABOND ADVANCED (GAUZE/BANDAGES/DRESSINGS) ×2
DERMABOND ADVANCED .7 DNX12 (GAUZE/BANDAGES/DRESSINGS) ×1 IMPLANT
DRAIN CHANNEL 19F RND (DRAIN) ×2 IMPLANT
DRAPE C-ARM 42X72 X-RAY (DRAPES) ×3 IMPLANT
DRAPE UTILITY 15X26 W/TAPE STR (DRAPE) ×6 IMPLANT
DRAPE WARM FLUID 44X44 (DRAPE) ×3 IMPLANT
ELECT REM PT RETURN 9FT ADLT (ELECTROSURGICAL) ×3
ELECTRODE REM PT RTRN 9FT ADLT (ELECTROSURGICAL) ×1 IMPLANT
ENDOLOOP SUT PDS II  0 18 (SUTURE) ×2
ENDOLOOP SUT PDS II 0 18 (SUTURE) IMPLANT
EVACUATOR SILICONE 100CC (DRAIN) ×2 IMPLANT
GLOVE BIO SURGEON STRL SZ7.5 (GLOVE) ×2 IMPLANT
GLOVE BIO SURGEON STRL SZ8 (GLOVE) ×3 IMPLANT
GLOVE BIOGEL PI IND STRL 7.0 (GLOVE) IMPLANT
GLOVE BIOGEL PI IND STRL 7.5 (GLOVE) IMPLANT
GLOVE BIOGEL PI IND STRL 8 (GLOVE) ×1 IMPLANT
GLOVE BIOGEL PI INDICATOR 7.0 (GLOVE) ×2
GLOVE BIOGEL PI INDICATOR 7.5 (GLOVE) ×2
GLOVE BIOGEL PI INDICATOR 8 (GLOVE) ×2
GLOVE ECLIPSE 7.0 STRL STRAW (GLOVE) ×2 IMPLANT
GLOVE SS BIOGEL STRL SZ 7.5 (GLOVE) IMPLANT
GLOVE SUPERSENSE BIOGEL SZ 7.5 (GLOVE) ×2
GLOVE SURG SS PI 7.0 STRL IVOR (GLOVE) ×2 IMPLANT
GOWN STRL REUS W/ TWL LRG LVL3 (GOWN DISPOSABLE) ×4 IMPLANT
GOWN STRL REUS W/ TWL XL LVL3 (GOWN DISPOSABLE) ×1 IMPLANT
GOWN STRL REUS W/TWL LRG LVL3 (GOWN DISPOSABLE) ×12
GOWN STRL REUS W/TWL XL LVL3 (GOWN DISPOSABLE) ×3
KIT BASIN OR (CUSTOM PROCEDURE TRAY) ×3 IMPLANT
KIT ROOM TURNOVER OR (KITS) ×3 IMPLANT
NS IRRIG 1000ML POUR BTL (IV SOLUTION) ×6 IMPLANT
PAD ARMBOARD 7.5X6 YLW CONV (MISCELLANEOUS) ×3 IMPLANT
POUCH SPECIMEN RETRIEVAL 10MM (ENDOMECHANICALS) ×3 IMPLANT
SCISSORS LAP 5X35 DISP (ENDOMECHANICALS) ×3 IMPLANT
SET CHOLANGIOGRAPH 5 50 .035 (SET/KITS/TRAYS/PACK) ×3 IMPLANT
SET IRRIG TUBING LAPAROSCOPIC (IRRIGATION / IRRIGATOR) ×3 IMPLANT
SLEEVE ENDOPATH XCEL 5M (ENDOMECHANICALS) ×3 IMPLANT
SPECIMEN JAR SMALL (MISCELLANEOUS) ×3 IMPLANT
SPONGE GAUZE 4X4 12PLY (GAUZE/BANDAGES/DRESSINGS) ×2 IMPLANT
SUT ETHILON 2 0 FS 18 (SUTURE) ×2 IMPLANT
SUT MNCRL AB 4-0 PS2 18 (SUTURE) ×3 IMPLANT
TAPE CLOTH SURG 4X10 WHT LF (GAUZE/BANDAGES/DRESSINGS) ×2 IMPLANT
TOWEL OR 17X24 6PK STRL BLUE (TOWEL DISPOSABLE) ×3 IMPLANT
TOWEL OR 17X26 10 PK STRL BLUE (TOWEL DISPOSABLE) ×3 IMPLANT
TRAY LAPAROSCOPIC (CUSTOM PROCEDURE TRAY) ×3 IMPLANT
TROCAR XCEL BLUNT TIP 100MML (ENDOMECHANICALS) ×3 IMPLANT
TROCAR XCEL NON-BLD 11X100MML (ENDOMECHANICALS) ×3 IMPLANT
TROCAR XCEL NON-BLD 5MMX100MML (ENDOMECHANICALS) ×3 IMPLANT

## 2013-04-02 NOTE — OR Nursing (Signed)
Pt positioned in OR by F. Lacie Draft ET, Jorene Minors, RN, and C. Babs Sciara, RN. Patient prone on chest rolls, arms tucked at sides with foam pads, foam pad under knees, 2 pillows under lower legs (toes not touching bed), head on gel donut and foam pad, safety strap across back.

## 2013-04-02 NOTE — Transfer of Care (Signed)
Immediate Anesthesia Transfer of Care Note  Patient: Brendan [name]  Procedure(s) Performed: Procedure(s): ENDOSCOPIC RETROGRADE CHOLANGIOPANCREATOGRAPHY (ERCP) (N/A)  Patient Location: PACU  Anesthesia Type:General  Level of Consciousness: awake, alert  and oriented  Airway & Oxygen Therapy: Patient Spontanous Breathing and Patient connected to nasal cannula oxygen  Post-op Assessment: Report given to PACU RN and Post -op Vital signs reviewed and stable  Post vital signs: Reviewed and stable  Complications: No apparent anesthesia complications

## 2013-04-02 NOTE — Op Note (Signed)
[facility] [place], [id number]   ERCP PROCEDURE REPORT  PATIENT: Brendan [name], Brendan [name].  MR# :[id number] BIRTHDATE: 11/21/1955  GENDER: Male ENDOSCOPIST: Inda Castle, MD REFERRED BY: Erroll Luna, M.D. PROCEDURE DATE:  04/02/2013 PROCEDURE:   ERCP with sphincterotomy/papillotomy and ERCP with removal of calculus/calculi ASA CLASS:   Class II INDICATIONS:suspected or rule out bile duct stones. MEDICATIONS: MAC sedation, administered by CRNA TOPICAL ANESTHETIC:  DESCRIPTION OF PROCEDURE:   After the risks benefits and alternatives of the procedure were thoroughly explained, informed consent was obtained.  The Pentax ERCP [id number]  endoscope was introduced through the mouth  and advanced to the third portion of the duodenum .  A small periampullary diverticulum was noted. The common bile duct was cannulated with a 0.35 mm guidewire. Injection demonstrated several 1-3 mm filling defects in the distal bile duct.  These filling defects were seen under fluoroscopy but the images were mistakenly not captured.  A 15 mm sphincterotomy was made.  The bile duct was swept with a 15 mm balloon stone extractor multiple times.  Inspissated bile and several stone fragments were delivered into the duodenum.  Final cholangiogram was normal. The scope was then completely withdrawn from the patient and the procedure terminated.     COMPLICATIONS:  ENDOSCOPIC IMPRESSION: choledocholithiasis-status post sphincterotomy and stone extraction  RECOMMENDATIONS: Advance diet as tolerated DC antibiotics in a.m.    _______________________________ eSigned:  Inda Castle, MD 04/02/2013 6:39 PM   CC:

## 2013-04-02 NOTE — Preoperative (Signed)
Beta Blockers   Reason not to administer Beta Blockers:Not Applicable 

## 2013-04-02 NOTE — Progress Notes (Signed)
Pt had stones of CBD on IOC. Trying to schedule the ERCP for this afternoon.  Dr Deatra Ina will be performing. Pt on [place] q 6 hours and being kept NPO Procedure and risks d/w pts wife and dtr as he is still groggy from surgery.  The agree to proceed  Azucena Freed  PA-C [id number]

## 2013-04-02 NOTE — Anesthesia Postprocedure Evaluation (Signed)
  Anesthesia Post-op Note  Patient: Brendan [name]  Procedure(s) Performed: Procedure(s): LAPAROSCOPIC CHOLECYSTECTOMY WITH  INTRAOPERATIVE CHOLANGIOGRAM (N/A)  Patient Location: PACU  Anesthesia Type:General  Level of Consciousness: awake and alert   Airway and Oxygen Therapy: Patient Spontanous Breathing  Post-op Pain: mild  Post-op Assessment: Post-op Vital signs reviewed, Patient's Cardiovascular Status Stable, Respiratory Function Stable, Patent Airway, No signs of Nausea or vomiting and Pain level controlled  Post-op Vital Signs: Reviewed and stable  Complications: No apparent anesthesia complications

## 2013-04-02 NOTE — Consult Note (Signed)
FMTS Attending Note  I personally saw and evaluated the patient. The plan of care was discussed with the resident team. I agree with the assessment and plan as documented by the resident.   58 y/o male POD #2 s/p laproscopic cholecystectomy. FMTS consulted for diabetes management. PMH significant for T2DM, ED, and HTN. Patient has uncontrolled diabetes (last Hemoglobin A1C 12.0). Patient admits that he has been nonadherent to home regimen of Metformin and Glipizide. He does not check glucoses on a regular basis. Patient and family report approx. 20 pound weight loss over the past 2-3 years, this is not intentional, no fevers or chills, no changes in bowel habits, denies previous history of colonoscopy.  Vitals: reviewed Gen: pleasant caucasian male, NAD HEENT: normocephalic, PERRL, EOMI, MMM, neck supple Cardiac: RRR, S1 and S2 present, no murmurs, no heaves/thrills Resp: CTAB, normal effort Abd: incision sites from lap chole clean/dry/intact, mild diffuse abdominal pain EXT: no edema, 2+ radial pulses bilateraly  Reviewed lab work and imaging.  58 y/o male POD #0 s/p lap chole. Primary management by surgical team.  1. Type 2 Diabetes - uncontrolled, patient will need initiation of insulin therapy, will start Lantus 10 units when taking PO, diabetic educator will be consulted, continue home metformin and increase gradually to 1000 BID 2. Weight loss - may be secondary to longstanding diabetes, no obvious signs for malignancy, will need screening colonoscopy as outpatient 3. History of elevated blood pressures - normotensive in the hospital, monitor clinically  Dossie Arbour MD

## 2013-04-02 NOTE — Interval H&P Note (Signed)
History and Physical Interval Note:  04/02/2013 7:55 AM  Brendan [name]  has presented today for surgery, with the diagnosis of acute cholecystitis  The various methods of treatment have been discussed with the patient and family. After consideration of risks, benefits and other options for treatment, the patient has consented to  Procedure(s): LAPAROSCOPIC CHOLECYSTECTOMY WITH POSSIBLE INTRAOPERATIVE CHOLANGIOGRAM (N/A) as a surgical intervention .  The patient's history has been reviewed, patient examined, no change in status, stable for surgery.  I have reviewed the patient's chart and labs.  Questions were answered to the patient's satisfaction.  LFT level improved.  Proceed with LGB [place] today.  The procedure has been discussed with the patient. Operative and non operative treatments have been discussed. Risks of surgery include bleeding, infection,  Common bile duct injury,  Injury to the stomach,liver, colon,small intestine, abdominal wall,  Diaphragm,  Major blood vessels,  And the need for an open procedure.  Other risks include worsening of medical problems, death,  DVT and pulmonary embolism, and cardiovascular events.   Medical options have also been discussed. The patient has been informed of long term expectations of surgery and non surgical options,  The patient agrees to proceed.     Brendan [name] A.

## 2013-04-02 NOTE — Progress Notes (Signed)
ERCP demonstrated multiple 2-3 mm filling defects in the distal duct (unfortunately, fluoroscopic images were not captured). Stones were removed following sphincterotomy.  Okay to advance diet and discontinue antibiotics in am.

## 2013-04-02 NOTE — Transfer of Care (Signed)
Immediate Anesthesia Transfer of Care Note  Patient: Brendan [name]  Procedure(s) Performed: Procedure(s): LAPAROSCOPIC CHOLECYSTECTOMY WITH  INTRAOPERATIVE CHOLANGIOGRAM (N/A)  Patient Location: PACU  Anesthesia Type:General  Level of Consciousness: awake, alert  and oriented  Airway & Oxygen Therapy: Patient Spontanous Breathing and Patient connected to face mask oxygen  Post-op Assessment: Report given to PACU RN, Post -op Vital signs reviewed and stable and Patient moving all extremities X 4  Post vital signs: Reviewed and stable  Complications: No apparent anesthesia complications

## 2013-04-02 NOTE — Progress Notes (Signed)
Plan is for ERCP later today with Dr. Deatra Ina for +IOC after lap chole earlier today

## 2013-04-02 NOTE — Op Note (Signed)
Laparoscopic Cholecystectomy with IOC Procedure Note  Indications: This patient presents with symptomatic gallbladder disease and will undergo laparoscopic cholecystectomy.Pt has elevated LFT as well but better today. Will proceed with lap cholecystectomy first with IOC.  The procedure has been discussed with the patient. Operative and non operative treatments have been discussed. Risks of surgery include bleeding, infection,  Common bile duct injury,  Injury to the stomach,liver, colon,small intestine, abdominal wall,  Diaphragm,  Major blood vessels,  And the need for an open procedure.  Other risks include worsening of medical problems, death,  DVT and pulmonary embolism, and cardiovascular events.   Medical options have also been discussed. The patient has been informed of long term expectations of surgery and non surgical options,  The patient agrees to proceed.    Pre-operative Diagnosis: Calculus of bile duct with acute cholecystitis and obstruction  Post-operative Diagnosis: Same  Surgeon: Esteven Overfelt A.   Assistants: OR staff  Anesthesia: General endotracheal anesthesia and Local anesthesia 0.25.% bupivacaine, with epinephrine  ASA Class: 3  Procedure Details  The patient was seen again in the Holding Room. The risks, benefits, complications, treatment options, and expected outcomes were discussed with the patient. The possibilities of reaction to medication, pulmonary aspiration, perforation of viscus, bleeding, recurrent infection, finding a normal gallbladder, the need for additional procedures, failure to diagnose a condition, the possible need to convert to an open procedure, and creating a complication requiring transfusion or operation were discussed with the patient. The patient and/or family concurred with the proposed plan, giving informed consent. The site of surgery properly noted/marked. The patient was taken to Operating Room, identified as Brendan [name] and the  procedure verified as Laparoscopic Cholecystectomy with Intraoperative Cholangiograms. A Time Out was held and the above information confirmed.  Prior to the induction of general anesthesia, antibiotic prophylaxis was administered. General endotracheal anesthesia was then administered and tolerated well. After the induction, the abdomen was prepped in the usual sterile fashion. The patient was positioned in the supine position with the left arm comfortably tucked, along with some reverse Trendelenburg.  Local anesthetic agent was injected into the skin near the umbilicus and an incision made. The midline fascia was incised and the Hasson technique was used to introduce a 12 mm port under direct vision. It was secured with a figure of eight Vicryl suture placed in the usual fashion. Pneumoperitoneum was then created with CO2 and tolerated well without any adverse changes in the patient's vital signs. Additional trocars were introduced under direct vision with an 11 mm trocar in the epigastrium and TWO  5 mm trocars in the right upper quadrant. All skin incisions were infiltrated with a local anesthetic agent before making the incision and placing the trocars.     The gallbladder was identified, the fundus grasped and retracted cephalad. He had gangrene and gallbladder decompressed.  Adhesions were lysed bluntly and with the electrocautery where indicated, taking care not to injure any adjacent organs or viscus. The infundibulum was grasped and retracted laterally, exposing the peritoneum overlying the triangle of Calot. This was then divided and exposed in a blunt fashion. The cystic duct was clearly identified and bluntly dissected circumferentially. The junctions of the gallbladder, cystic duct and common bile duct were clearly identified prior to the division of any linear structure.   An incision was made in the cystic duct and the cholangiogram catheter introduced. The catheter was secured using an  endoclip. The study  Was of poor quality due to the  nature of the cystic duct and filled mostly the gallbladder.  Cystic duct and part of CBD could be visualized.  There appeared to be distal CBD stone with no filling of the duodenum.  Could not generate enough pressure to visualize intrahepatic ducts.  The catheter was then removed.   The cystic duct was then  ligated with surgical clips and Endoloop but appeared necrotic.  No leakage of bile noted. The cystic artery was identified, dissected free, ligated with clips and divided as well. Posterior cystic artery clipped and divided.  The gallbladder was dissected from the liver bed in retrograde fashion with the electrocautery. The gallbladder was removed and placed in an Endocatch bag. The liver bed was irrigated and inspected. Hemostasis was achieved with the electrocautery. Copious irrigation was utilized and was repeatedly aspirated until clear all particulate matter. Hemostasis was achieved with no signs  Of bleeding or bile leakage.  Pneumoperitoneum was completely reduced after viewing removal of the trocars under direct vision. The wound was thoroughly irrigated and the fascia was then closed with a figure of eight suture; the skin was then closed with 4 O monocryl and a sterile dressing was applied.  Instrument, sponge, and needle counts were correct at closure and at the conclusion of the case.   Findings: Cholecystitis with Cholelithiasis and gangrene  Estimated Blood Loss: less than 100 mL         Drains: 19 F         Total IV Fluids: 600 mL         Specimens: Gallbladder           Complications: None; patient tolerated the procedure well.         Disposition: PACU - hemodynamically stable.         Condition: stable

## 2013-04-02 NOTE — Progress Notes (Signed)
Inpatient Diabetes Program Recommendations  AACE/ADA: New Consensus Statement on Inpatient Glycemic Control (2013)  Target Ranges:  Prepandial:   less than 140 mg/dL      Peak postprandial:   less than 180 mg/dL (1-2 hours)      Critically ill patients:  140 - 180 mg/dL     Results for Brendan [name], Brendan [name] (MRN [id number]) as of 04/02/2013 12:38  Ref. Range 04/01/2013 11:47 04/01/2013 14:23 04/01/2013 16:37 04/01/2013 21:45  Glucose-Capillary Latest Range: 70-99 mg/dL 293 (H) 326 (H) 305 (H) 201 (H)    Results for Brendan [name], Brendan [name] (MRN [id number]) as of 04/02/2013 12:38  Ref. Range 04/02/2013 06:23 04/02/2013 10:08 04/02/2013 12:01  Glucose-Capillary Latest Range: 70-99 mg/dL 262 (H) 249 (H) 270 (H)    Results for Brendan [name], Brendan [name] (MRN [id number]) as of 04/02/2013 12:38  Ref. Range 04/01/2013 14:20  Hemoglobin A1C Latest Range: <5.7 % 12.0 (H)     **Admitted with abdominal pain/Cholecystitis.  S/P surgery this morning.    **Noted that from GI progress note that pt may have ERCP with Dr. Deatra Ina today as well  **Pt has history of poorly controlled DM.  Current A1c 12%.  Note that pt was last seen by his PCP in [facility] on 03/11/12.  Pt likely needs insulin for home and definitely needs close follow up with his PCP at [facility] clinic after d/c.   **MD- Please consider the following in-hospital insulin adjustments:  1. Add basal insulin while in hospital- Lantus 12 units QHS (0.2 units/kg dosing) 2. Change Novolog Moderate SSi to Q4 hour coverage   Will follow. Wyn Quaker RN, MSN, CDE Diabetes Coordinator Inpatient Diabetes Program Team Pager: [phone] (8a-10p)
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
## 2013-04-02 NOTE — Anesthesia Preprocedure Evaluation (Addendum)
Anesthesia Evaluation  Patient identified by MRN, date of birth, ID band Patient awake    Reviewed: Allergy & Precautions, H&P , NPO status , Patient's Chart, lab work & pertinent test results, reviewed documented beta blocker date and time   Airway Mallampati: II TM Distance: >3 FB Neck ROM: Full    Dental  (+) Teeth Intact   Pulmonary          Cardiovascular hypertension, Rhythm:Regular Rate:Normal     Neuro/Psych    GI/Hepatic   Endo/Other  diabetes  Renal/GU      Musculoskeletal   Abdominal   Peds  Hematology   Anesthesia Other Findings   Reproductive/Obstetrics                         Anesthesia Physical Anesthesia Plan  ASA: II  Anesthesia Plan: General   Post-op Pain Management:    Induction: Intravenous, Rapid sequence and Cricoid pressure planned  Airway Management Planned: Oral ETT  Additional Equipment:   Intra-op Plan:   Post-operative Plan: Extubation in OR  Informed Consent: I have reviewed the patients History and Physical, chart, labs and discussed the procedure including the risks, benefits and alternatives for the proposed anesthesia with the patient or authorized representative who has indicated his/her understanding and acceptance.     Plan Discussed with:   Anesthesia Plan Comments:         Anesthesia Quick Evaluation

## 2013-04-02 NOTE — H&P (View-Only) (Signed)
Patient seen, examined, and I agree with the above documentation, including the assessment and plan. Patient admitted with right upper quadrant pain and ultrasound suspicious for acute cholecystitis. His liver enzymes are elevated, both hepatocellular and cholestatic pattern. CBD was not dilated by ultrasound and my suspicion for CBD obstruction/stone is low. Elevated liver enzymes could all be secondary to cholecystitis MRCP ordered to exclude CBD stone. If positive would need ERCP otherwise cholecystectomy per surgery Pain control and IV ABX Discussed with patient and family

## 2013-04-02 NOTE — Anesthesia Procedure Notes (Signed)
Procedure Name: Intubation Date/Time: 04/02/2013 8:24 AM Performed by: Kyung Rudd Pre-anesthesia Checklist: Patient identified, Emergency Drugs available, Suction available, Patient being monitored and Timeout performed Patient Re-evaluated:Patient Re-evaluated prior to inductionOxygen Delivery Method: Circle system utilized Preoxygenation: Pre-oxygenation with 100% oxygen Intubation Type: IV induction Ventilation: Mask ventilation without difficulty and Oral airway inserted - appropriate to patient size Laryngoscope Size: Mac and 3 Grade View: Grade I Tube type: Oral Tube size: 7.5 mm Number of attempts: 1 Airway Equipment and Method: Stylet Placement Confirmation: ETT inserted through vocal cords under direct vision,  positive ETCO2 and breath sounds checked- equal and bilateral Secured at: 22 cm Tube secured with: Tape Dental Injury: Teeth and Oropharynx as per pre-operative assessment

## 2013-04-02 NOTE — Anesthesia Procedure Notes (Signed)
Procedure Name: Intubation Date/Time: 04/02/2013 5:52 PM Performed by: Eligha Bridegroom Pre-anesthesia Checklist: Emergency Drugs available, Timeout performed, Patient identified, Suction available and Patient being monitored Patient Re-evaluated:Patient Re-evaluated prior to inductionOxygen Delivery Method: Circle system utilized Preoxygenation: Pre-oxygenation with 100% oxygen Intubation Type: IV induction and Cricoid Pressure applied Laryngoscope Size: Mac and 4 Grade View: Grade I Tube type: Oral Tube size: 7.5 mm Number of attempts: 1 Airway Equipment and Method: Stylet Placement Confirmation: ETT inserted through vocal cords under direct vision,  breath sounds checked- equal and bilateral and positive ETCO2 Secured at: 21 cm Tube secured with: Tape Dental Injury: Teeth and Oropharynx as per pre-operative assessment

## 2013-04-02 NOTE — Consult Note (Signed)
[place] Pager: [phone]  Patient name: Brendan [name] Medical record number: [id number] Date of birth: 1955-08-13 Age: 58 y.o. Gender: male  Primary Care Provider: Thersa Salt, DO Service: Surgery  Code Status: full  Chief Complaint: uncontrolled blood sugar  Assessment and Plan: JOAL MASTROGIACOMO is a 58 y.o. male s/p laparoscopic cholecystectomy and IOC this hospitalization with uncontrolled blood sugars. PMH is significant for DM2, ED, HTN.   # Cholecystitis: Surgery managing. S/p lap chole today with ERCP planned.  # DM2: not taking his prescribed meds due to cost and concerns for cardiac side effects. Consulted for management of elevated sugars this hospitalization ranging from 200-300s.  - Diatebetes coordinator recs: lantus 12U with q4hr - Initial plan to start at Lantus 10 U with SSI, and continue metformin on DC as well for OP management - will plan to follow outpt  # Alcohol history: concerning for possible withdrawal due to occasional heavy drinking - Consider CIWA protocol  # h/o HTN: normotensive thus far this hospitalization  FEN/GI: per surgery, NPO now for ERCP Prophylaxis: SCDs in place  Disposition: per primary team, surgery  History of Present Illness: Brendan [name] is a 58 y.o. male s/p laparoscopic cholecystectomy and IOC this hospitalization with uncontrolled blood sugars. States he is going back down to surgery for ERCP today for possible stenting of common bile duct due to stones visualized during the intraoperative cholangiogram. Family in room including wife, daughter and grandson. Patient has had DM2 for ~25 years. Admits that he has not been compliant with his current regime of glipizide and metformin for over 3 months now because of not having insurance. Knows that he runs high in his blood sugars even though not checking because of symptoms including polydipsia, polyuria and occasional blurry vision.  Endorses neuropathic "burning" in his feet. Strong family hx of DM2 in mother's side of family leading to amputations. Does not regulate his diet and has minimal exercise.  Wants to follow-up with [facility] service on outpt basis for continued management of diabetes. Is concerned about the need for insulin and costs associated. Wife stated she is able to bring him to his appointments.   Review Of Systems: Per HPI.  In moderate amount of belly pain from surgery. Pleuritic pain upon deep inspiration. Otherwise 12 point review of systems was performed and was unremarkable.  Patient Active Problem List   Diagnosis Date Noted  . Acute cholecystitis 04/01/2013  . Transaminitis 04/01/2013  . Preventative health care 03/11/2012  . Elevated blood pressure 03/11/2012  . Type II or unspecified type diabetes mellitus without mention of complication, uncontrolled 03/15/2006   Past Medical History: Past Medical History  Diagnosis Date  . Diabetes mellitus without complication   . Erectile dysfunction 2010  . HTN (hypertension)   . Complication of anesthesia   . PONV (postoperative nausea and vomiting)   . Cholecystitis 03/2013   Past Surgical History: Past Surgical History  Procedure Laterality Date  . Appendectomy  1969  . 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    Social History: History  Substance Use Topics  . Smoking status: Never Smoker   . Smokeless tobacco: Current User    Types: Snuff  . Alcohol Use: Yes     Comment: occ   Additional social history: [id number] alcoholic drinks per week, occasionally more, no other drugs. Works as Chief Strategy Officer. No regulation of his diet.  Lives at home with wife.  Please also refer to relevant sections of EMR.  Family History: Family History  Problem Relation Age of Onset  . Colon cancer Maternal Aunt   . Diabetes Maternal Grandmother   . Stroke Maternal Grandmother    Allergies and  Medications: No Known Allergies No current facility-administered medications on file prior to encounter.   Current Outpatient Prescriptions on File Prior to Encounter  Medication Sig Dispense Refill  . Blood Glucose Monitoring Suppl (RELION PRIME MONITOR) DEVI 1 Device by Does not apply route as directed.  1 Device  0  . glipiZIDE (GLUCOTROL) 5 MG tablet Take 1 tablet (5 mg total) by mouth 2 (two) times daily before a meal.  60 tablet  3  . glucose blood (RELION PRIME TEST) test strip Use to test blood sugar up to 4 times daily.  100 each  3  . metFORMIN (GLUCOPHAGE) 500 MG tablet Take 1 tablet (500 mg total) by mouth 2 (two) times daily with a meal.  60 tablet  3  . ReliOn Ultra Thin Lancets MISC Use to test blood sugar up to 4 times daily.  210 each  3    Objective: BP 135/75  Pulse 106  Temp(Src) 98.4 F (36.9 C) (Oral)  Resp 18  Ht 5\' 10"  (1.778 m)  Wt 132 lb 0.9 oz (59.9 kg)  BMI 18.95 kg/m2  SpO2 99% Exam: General: ill appearing man lying in hospital bed with minimal movements HEENT: NCAT, scleral icterus, no cervical lymphadenopathy Cardiovascular: RRR, normal s1 & s2 with no murmurs, rubs or gallops Respiratory: shallow respirations, CTAB Abdomen: no BS heard, tender to light palpation, JP drain on right abd with serosanguis fluid draining. Incisions hemostatic from lap cholecystectomy Extremities: warm and well perfused, 2+ dorsalis pedis pulses, no pedal edema Skin: lap chole incisions over abd, otherwise no rashes or lesions noted Neuro: CN III-XII individually tested and intact, alert and oriented x4  Labs and Imaging: CBC BMET   Recent Labs Lab 04/02/13 0355  WBC 13.5*  HGB 12.4*  HCT 34.7*  PLT 168    Recent Labs Lab 04/02/13 0355  NA 133*  K 4.4  CL 94*  CO2 24  BUN 24*  CREATININE 0.59  GLUCOSE 242*  CALCIUM 8.9     Glucose: 04/02/2013 - 0623 (262), 1008 (249), 1201 (270)  A1C: 12.0  [place] RUQ 04/01/2013: IMPRESSION: 1. Ultrasound findings  suspicious for acute cholecystitis. Correlate clinically. Gallstones and gallbladder sludge are noted with gallbladder wall edema. 2. Probable incidental 1.4 cm liver hemangioma.   MR Abd 04/01/2013: FINDINGS: Distended gallbladder with layering gallstones and gallbladder sludge, including a 6 mm stone at the junction of the gallbladder neck and cystic duct (series 3/ image 25). Associated thickened gallbladder wall and surrounding pericholecystic fluid, compatible with acute cholecystitis. IMPRESSION: Acute cholecystitis, as described above. Common duct measures 8 mm. No choledocholithiasis is seen.  Roland 04/02/2013: IMPRESSION: There is a persistent filling defect in the distal common bile duct without significant drainage into the duodenum. Findings are concerning for an obstructing stone. Extravasation of contrast outside of the biliary system.  Early Osmond, Med Student 04/02/2013, 1:38 PM [place] Intern pager: [phone], text pages welcome  I have seen and examined the patient with Student Dr. Moshe Cipro and I agree with his documentation above. My findings are summarized below and my annotations are noted in blue.   S: Notes recent symptoms now attributed to Acute cholecystitis. He is feeling better  since the surgery and awaiting his ERCP. HE states that he hasn't taken his medications for the last 3 months due to concerns about cardiac side effects. He doesn't want to but is willing to use insulin. He is concerned he will pass out randomly. He states that he knows when he is hyperglycemic when he gets polyuria, polydipsia, or blurred vision. He's also concerned that he cannot afford low carb food.   O: Filed Vitals:   04/02/13 1429  BP: 126/71  Pulse: 103  Temp: 98.1 F (36.7 C)  Resp: 16   Gen: NAD, alert, cooperative with exam HEENT: NCAT, EOMI, PERRL, slight icterus CV: RRR, good S1/S2, no murmur Resp: CTABL, no wheezes, non-labored Abd: Soft, tender to  light palaption post op, Clean dry surgical wounds, JP drain in RUQ,  Ext: No edema, warm, wearing SCDs, 2+ DP pulses, no lesions on feet Neuro: Alert and oriented, strength 5/5 and sensation intact in all 4 extremities.   A/P 58 y/o male with acute choledocholithiasis and  longstanding diabetes uncontrolled due to worsening disease process and non compliance. He is now S/p lap chole but awaiting ERCP to remove the stone in his CBD. We have had a lengthy discussion with him concerning the fact that he will need to start at least long acting insulin on discharge. We will have to discuss with him his financial resources as lantus is very expensive and may need to use NPH as an alternative if he doesn't have insurance.   We recommend starting 10 units lantus tomorrow after is no longer NPO and continuing SSI while IP. Metformin 1000 mg BID should be started on DC along with the lantus.   Consider Q4 SSI while NPO  We appreciate Surgery's management of this patient and are happy to follow along with you to manage his diabetes while IP and assist in planning a good home insulin regimen.    Laroy Apple, MD [place] Resident, PGY-2 04/02/2013, 6:03 PM

## 2013-04-02 NOTE — Anesthesia Preprocedure Evaluation (Addendum)
Anesthesia Evaluation  Patient identified by MRN, date of birth, ID band Patient awake    Reviewed: Allergy & Precautions, H&P , NPO status , Patient's Chart, lab work & pertinent test results  History of Anesthesia Complications (+) PONV  Airway Mallampati: I TM Distance: >3 FB Neck ROM: Full    Dental  (+) Edentulous Upper, Edentulous Lower   Pulmonary  breath sounds clear to auscultation        Cardiovascular hypertension, Rhythm:Regular Rate:Normal     Neuro/Psych    GI/Hepatic   Endo/Other  diabetes  Renal/GU      Musculoskeletal   Abdominal   Peds  Hematology   Anesthesia Other Findings   Reproductive/Obstetrics                          Anesthesia Physical Anesthesia Plan  ASA: III  Anesthesia Plan: General   Post-op Pain Management:    Induction: Intravenous  Airway Management Planned: Oral ETT  Additional Equipment:   Intra-op Plan:   Post-operative Plan: Extubation in OR  Informed Consent: I have reviewed the patients History and Physical, chart, labs and discussed the procedure including the risks, benefits and alternatives for the proposed anesthesia with the patient or authorized representative who has indicated his/her understanding and acceptance.     Plan Discussed with: CRNA and Surgeon  Anesthesia Plan Comments:         Anesthesia Quick Evaluation

## 2013-04-02 NOTE — Interval H&P Note (Signed)
History and Physical Interval Note:Underwent cholecystectomy today.  IOC demonstrated several filling defects in distal CBD c/w bile duct stones.    04/02/2013 5:32 PM  Brendan [name]  has presented today for surgery, with the diagnosis of stones in bile duct.  The various methods of treatment have been discussed with the patient and family. After consideration of risks, benefits and other options for treatment, the patient has consented to  Procedure(s): ENDOSCOPIC RETROGRADE CHOLANGIOPANCREATOGRAPHY (ERCP) (N/A) as a surgical intervention .  The patient's history has been reviewed, patient examined, no change in status, stable for surgery.  I have reviewed the patient's chart and labs.  Questions were answered to the patient's satisfaction.  The risks, benefits, and possible complications of the procedure, including bleeding, perforation, surgery, and the 5-10% risk for pancreatitis, were explained to the patient.  Patient's questions were answered.    Erskine Emery

## 2013-04-03 ENCOUNTER — Encounter (HOSPITAL_COMMUNITY): Payer: Self-pay | Admitting: Gastroenterology

## 2013-04-03 DIAGNOSIS — E118 Type 2 diabetes mellitus with unspecified complications: Secondary | ICD-10-CM

## 2013-04-03 DIAGNOSIS — IMO0002 Reserved for concepts with insufficient information to code with codable children: Secondary | ICD-10-CM

## 2013-04-03 DIAGNOSIS — Z0000 Encounter for general adult medical examination without abnormal findings: Secondary | ICD-10-CM

## 2013-04-03 DIAGNOSIS — K810 Acute cholecystitis: Secondary | ICD-10-CM

## 2013-04-03 DIAGNOSIS — E1165 Type 2 diabetes mellitus with hyperglycemia: Secondary | ICD-10-CM

## 2013-04-03 LAB — GLUCOSE, CAPILLARY
GLUCOSE-CAPILLARY: 213 mg/dL — AB (ref 70–99)
Glucose-Capillary: 176 mg/dL — ABNORMAL HIGH (ref 70–99)
Glucose-Capillary: 211 mg/dL — ABNORMAL HIGH (ref 70–99)
Glucose-Capillary: 274 mg/dL — ABNORMAL HIGH (ref 70–99)

## 2013-04-03 LAB — CBC
HEMATOCRIT: 32.1 % — AB (ref 39.0–52.0)
Hemoglobin: 11.3 g/dL — ABNORMAL LOW (ref 13.0–17.0)
MCH: 31 pg (ref 26.0–34.0)
MCHC: 35.2 g/dL (ref 30.0–36.0)
MCV: 87.9 fL (ref 78.0–100.0)
PLATELETS: 154 10*3/uL (ref 150–400)
RBC: 3.65 MIL/uL — ABNORMAL LOW (ref 4.22–5.81)
RDW: 12.4 % (ref 11.5–15.5)
WBC: 8.8 10*3/uL (ref 4.0–10.5)

## 2013-04-03 LAB — COMPREHENSIVE METABOLIC PANEL
ALBUMIN: 2.2 g/dL — AB (ref 3.5–5.2)
ALT: 100 U/L — AB (ref 0–53)
AST: 30 U/L (ref 0–37)
Alkaline Phosphatase: 152 U/L — ABNORMAL HIGH (ref 39–117)
BILIRUBIN TOTAL: 2.3 mg/dL — AB (ref 0.3–1.2)
BUN: 17 mg/dL (ref 6–23)
CHLORIDE: 98 meq/L (ref 96–112)
CO2: 20 mEq/L (ref 19–32)
Calcium: 8.3 mg/dL — ABNORMAL LOW (ref 8.4–10.5)
Creatinine, Ser: 0.42 mg/dL — ABNORMAL LOW (ref 0.50–1.35)
GFR calc Af Amer: >90 mL/min (ref >90)
GFR calc non Af Amer: >90 mL/min (ref >90)
Glucose, Bld: 194 mg/dL — ABNORMAL HIGH (ref 70–99)
POTASSIUM: 3.7 meq/L (ref 3.7–5.3)
SODIUM: 134 meq/L — AB (ref 137–147)
Total Protein: 5.7 g/dL — ABNORMAL LOW (ref 6.0–8.3)

## 2013-04-03 LAB — TSH: TSH: 1.207 u[IU]/mL (ref 0.350–4.500)

## 2013-04-03 MED ORDER — BD GETTING STARTED TAKE HOME KIT: 3/10ML X 30G SYRINGES
1.0000 | Freq: Once | Status: AC
  Administered 2013-04-03: 1
  Filled 2013-04-03: qty 1

## 2013-04-03 MED ORDER — ENSURE COMPLETE PO LIQD: 237.0000 mL | Freq: Two times a day (BID) | ORAL | Status: DC

## 2013-04-03 MED ORDER — GLUCERNA SHAKE PO LIQD
237.0000 mL | Freq: Two times a day (BID) | ORAL | Status: DC
  Administered 2013-04-03: 237 mL via ORAL

## 2013-04-03 MED ORDER — INSULIN GLARGINE 100 UNIT/ML ~~LOC~~ SOLN
10.0000 [IU] | Freq: Every day | SUBCUTANEOUS | Status: DC
  Administered 2013-04-03 – 2013-04-04 (×2): 10 [IU] via SUBCUTANEOUS
  Filled 2013-04-03 (×2): qty 0.1

## 2013-04-03 MED ORDER — METFORMIN HCL 500 MG PO TABS: 500.0000 mg | ORAL_TABLET | Freq: Two times a day (BID) | ORAL | Status: DC

## 2013-04-03 NOTE — Progress Notes (Signed)
INITIAL NUTRITION ASSESSMENT  DOCUMENTATION CODES Per approved criteria  -Not Applicable   INTERVENTION: Glucerna Shake po BID, each supplement provides 220 kcal and 10 grams of protein RD to follow for nutrition care plan  NUTRITION DIAGNOSIS: Inadequate oral intake related to decreased appetite, acute illness as evidenced by pt report  Goal: Pt to meet >/= 90% of their estimated nutrition needs   Monitor:  PO & supplemental intake, weight, labs, I/O's  Reason for Assessment: Malnutrition Screening Tool Report  58 y.o. male  Admitting Dx: acute cholecystitis  ASSESSMENT: 58 year old male with a history of DM who presented to [facility] with RUQ abdominal pain. Duration of symptoms is 3 days. Onset was Saturday after eating a hamburger from [employer] and consuming 4 beers. Associated with chills, nausea and vomiting. Also complains of reflux symptoms. Modifying factors include; phenergan and a PPI.   No aggravating factors. Alleviated by phenergan. Previous symptoms approximately 3 weeks ago after consuming 4 beers as well. He reports a poor appetite since Saturday. CBGs have been elevated. Abdominal US shows a normal CBD, suspicious for acute cholecystitis. White count of 16K. Normal lipase. Elevated LFTs including a bilirubin of 7.1. He reports occasional alcohol use.  Patient s/p procedure 3/18: LAPAROSCOPIC CHOLECYSTECTOMY  Patient reports his appetite is better; currently on Full Liquids; family report pt has lost 20 lbs in the past 2 years; no recent weight loss; would benefit from addition of oral nutrition supplements -- amenable to Glucerna Shake; pt interested in gaining weight and consuming supplement at home.  Nutrition focused physical exam completed.  No muscle or subcutaneous fat depletion noticed.  Height: Ht Readings from Last 1 Encounters:  04/01/13 5' 10"  (1.778 m)    Weight: Wt Readings from Last 1 Encounters:  04/02/13 147 lb 4.3 oz (66.8 kg)    Ideal  Body Weight: 166 lb  % Ideal Body Weight: 88%  Wt Readings from Last 10 Encounters:  04/02/13 147 lb 4.3 oz (66.8 kg)  04/02/13 147 lb 4.3 oz (66.8 kg)  04/02/13 147 lb 4.3 oz (66.8 kg)  03/11/12 146 lb 14.4 oz (66.633 kg)  05/31/09 150 lb (68.04 kg)  03/11/08 155 lb (70.308 kg)    Usual Body Weight: 146 lb  % Usual Body Weight: 101%  BMI:  Body mass index is 21.13 kg/(m^2).  Estimated Nutritional Needs: Kcal: 1900-2100 Protein: 100-110 gm Fluid: 1.9-2.1 L  Skin: Intact  Diet Order: Full Liquid  EDUCATION NEEDS: -No education needs identified at this time   Intake/Output Summary (Last 24 hours) at 04/03/13 1513 Last data filed at 04/03/13 1419  Gross per 24 hour  Intake 4859.58 ml  Output   1695 ml  Net 3164.58 ml     Labs:   Recent Labs Lab 04/01/13 1127 04/02/13 0355 04/03/13 0540  NA 131* 133* 134*  K 4.3 4.4 3.7  CL 87* 94* 98  CO2 22 24 20   BUN 18 24* 17  CREATININE 0.62 0.59 0.42*  CALCIUM 9.4 8.9 8.3*  GLUCOSE 341* 242* 194*    CBG (last 3)   Recent Labs  04/02/13 2147 04/03/13 0731 04/03/13 1236  GLUCAP 200* 176* 211*    Scheduled Meds: . ampicillin-sulbactam (UNASYN) IV  3 g Intravenous 4 times per day  . bd getting started take home kit  1 kit Other Once  . enoxaparin (LOVENOX) injection  40 mg Subcutaneous Daily  . feeding supplement (ENSURE COMPLETE)  237 mL Oral BID BM  . insulin aspart  0-15 Units Subcutaneous TID WC  . insulin glargine  10 Units Subcutaneous Daily  . pantoprazole  40 mg Oral Q1200    Continuous Infusions: . sodium chloride 125 mL/hr at 04/02/13 1952  . lactated ringers 50 mL/hr at 04/02/13 1646    Past Medical History  Diagnosis Date  . Diabetes mellitus without complication   . Erectile dysfunction 2010  . HTN (hypertension)   . Complication of anesthesia   . PONV (postoperative nausea and vomiting)   . Cholecystitis 03/2013    Past Surgical History  Procedure Laterality Date  . Appendectomy   1969  . 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   . Cholecystectomy    . Ercp N/A 04/02/2013    Procedure: ENDOSCOPIC RETROGRADE CHOLANGIOPANCREATOGRAPHY (ERCP);  Surgeon: Inda Castle, MD;  Location: [facility];  Service: Endoscopy;  Laterality: N/A;    Arthur Holms, RD, LDN Pager #: [phone] After-Hours Pager #: [phone]

## 2013-04-03 NOTE — Progress Notes (Signed)
Inpatient Diabetes Program Recommendations  AACE/ADA: New Consensus Statement on Inpatient Glycemic Control (2013)  Target Ranges:  Prepandial:   less than 140 mg/dL      Peak postprandial:   less than 180 mg/dL (1-2 hours)      Critically ill patients:  140 - 180 mg/dL     Results for Brendan [name], Brendan [name] (MRN [id number]) as of 04/03/2013 14:43  Ref. Range 04/03/2013 07:31 04/03/2013 12:36  Glucose-Capillary Latest Range: 70-99 mg/dL 176 (H) 211 (H)    Results for Brendan [name], Brendan [name] (MRN [id number]) as of 04/03/2013 14:43  Ref. Range 04/01/2013 14:20  Hemoglobin A1C Latest Range: <5.7 % 12.0 (H)     **Admitted with abdominal pain/Cholecystitis. S/P surgery and ERCP yesterday.  **Pt has history of poorly controlled DM. Current A1c 12%. Note that pt was last seen by his PCP in [facility] clinic on 03/11/12. Pt likely needs insulin for home and definitely needs close follow up with his PCP at [facility] clinic after d/c.  **Per Resident, pt will likely be d/c'd home on NPH insulin.  Needs NPH b/c pt does not have insurance.  Can get NPH insulin at [facility] for $25 per vial.    **Spoke with pt and his wife and daughter about pt's A1c of 12%.  Explained what an A1c is and what it measures.  Reminded pt and family about the importance of good CBG control to to prevent both acute and long-term DM complications.  Pt and his wife both told me that when pt takes care of himself he can significantly reduce his A1c (pt told me he got his A1c down to 6.5% when he "tries really hard".  Discussed home insulin with pt and reviewed signs and symptoms of hypoglycemia and how to properly treat at home.  Pt and wife and daughter very receptive to my visit and thanked me for the information.  Have asked RN caring for pt to please begin insulin teaching asap.  Placed order for insulin starter kit.    Will follow. Wyn Quaker RN, MSN, CDE Diabetes Coordinator Inpatient Diabetes Program Team Pager:  [phone] (8a-10p)

## 2013-04-03 NOTE — Progress Notes (Signed)
Daily Rounding Note  04/03/2013, 9:35 AM  LOS: 2 days   SUBJECTIVE:       Nausea but no emesis overnight after getting narcotics.  Gone this AM.  Tolerated Dilaudid this AM.  Tolerating clears.  Feels much  Better.   OBJECTIVE:         Vital signs in last 24 hours:    Temp:  [97.5 F (36.4 C)-99.8 F (37.7 C)] 98.7 F (37.1 C) (03/19 0453) Pulse Rate:  [96-111] 98 (03/19 0453) Resp:  [14-21] 16 (03/19 0453) BP: (126-155)/(61-88) 139/75 mmHg (03/19 0453) SpO2:  [94 %-100 %] 94 % (03/19 0453) Weight:  [66.8 kg (147 lb 4.3 oz)] 66.8 kg (147 lb 4.3 oz) (03/18 2015) Last BM Date: 03/31/13 General: looks tired but well   Heart: rrr Chest: clear bl Abdomen: soft,  ND, bloody drainage into JP drain, tender at incisions.  Extremities: no CCE Neuro/Psych:  Alert, relaxed.  No confusion  Intake/Output from previous day: 03/18 0701 - 03/19 0700 In: 5429.2 [P.O.:800; I.V.:4529.2; IV Piggyback:100] Out: 1570 [Urine:1450; Drains:95; Blood:25]  Intake/Output this shift:    Lab Results:  Recent Labs  04/01/13 1127 04/02/13 0355 04/03/13 0540  WBC 16.3* 13.5* 8.8  HGB 14.7 12.4* 11.3*  HCT 40.8 34.7* 32.1*  PLT 248 168 154   BMET  Recent Labs  04/01/13 1127 04/02/13 0355 04/03/13 0540  NA 131* 133* 134*  K 4.3 4.4 3.7  CL 87* 94* 98  CO2 22 24 20   GLUCOSE 341* 242* 194*  BUN 18 24* 17  CREATININE 0.62 0.59 0.42*  CALCIUM 9.4 8.9 8.3*   LFT  Recent Labs  04/01/13 1127 04/02/13 0355 04/03/13 0540  PROT 7.2 5.9* 5.7*  ALBUMIN 3.4* 2.7* 2.2*  AST 156* 45* 30  ALT 334* 186* 100*  ALKPHOS 330* 232* 152*  BILITOT 7.1* 4.5* 2.3*   PT/INR No results found for this basename: LABPROT, INR,  in the last 72 hours Hepatitis Panel No results found for this basename: HEPBSAG, HCVAB, HEPAIGM, HEPBIGM,  in the last 72 hours  Studies/Results: Dg Cholangiogram Operative  04/02/2013   CLINICAL DATA:   Cholecystitis.  EXAM: INTRAOPERATIVE CHOLANGIOGRAM  TECHNIQUE: Cholangiographic images from the C-arm fluoroscopic device were submitted for interpretation post-operatively. Please see the procedural report for the amount of contrast and the fluoroscopy time utilized.  COMPARISON:  MR MRCP WO/W CM dated 04/01/2013  FINDINGS: The contrast injection demonstrates extravasation outside of the biliary system. There is contrast along the right hepatic lobe. There is filling of the gallbladder and the common bile duct. There is a persistent filling defect in the distal common bile duct. No significant drainage into the duodenum.  IMPRESSION: There is a persistent filling defect in the distal common bile duct without significant drainage into the duodenum. Findings are concerning for an obstructing stone.  Extravasation of contrast outside of the biliary system.   Electronically Signed   By: Markus Daft M.D.   On: 04/02/2013 09:43   Dg Ercp  04/02/2013   CLINICAL DATA:  Retained stones.  EXAM: ERCP  TECHNIQUE: Multiple spot images obtained with the fluoroscopic device and submitted for interpretation post-procedure.  COMPARISON:  DG CHOLANGIOGRAM OPERATIVE dated 04/02/2013; MR MRCP WO/W CM dated 04/01/2013  FINDINGS: Two spot fluoroscopic images from an ERCP are submitted. These demonstrate possible mild wall irregularity, but no gross filling defects. According to the procedure note, the fluoroscopic images were mistakenly not captured, and sphincterotomy  and balloon stone extraction was performed.  IMPRESSION: Limited spot views from ERCP for sphincterotomy and stone extraction.  These images were submitted for radiologic interpretation only. Please see the procedural report for the amount of contrast and the fluoroscopy time utilized.   Electronically Signed   By: Camie Patience M.D.   On: 04/02/2013 20:38   Mr Abd W/wo Cm/mrcp  04/02/2013   CLINICAL DATA:  Right upper quadrant pain, ultrasound suspicious for acute  cholecystitis  EXAM: MRI ABDOMEN WITHOUT AND WITH CONTRAST (INCLUDING MRCP)  TECHNIQUE: Multiplanar multisequence MR imaging of the abdomen was performed both before and after the administration of intravenous contrast. Heavily T2-weighted images of the biliary and pancreatic ducts were obtained, and three-dimensional MRCP images were rendered by post processing.  CONTRAST:  67mL MULTIHANCE GADOBENATE DIMEGLUMINE 529 MG/ML IV SOLN  COMPARISON:  Abdominal ultrasound dated 04/01/2013  FINDINGS: Mild patchy opacities at the lung bases (series 5/image 2), likely atelectasis, although poorly evaluated on MRI.  Liver is notable for a 10 mm T2 hyperintense lesion in the lateral segment left hepatic lobe (series 3/image 23), likely reflecting a benign hemangioma.  Spleen, pancreas, and adrenal glands are within normal limits.  Distended gallbladder with layering gallstones and gallbladder sludge, including a 6 mm stone at the junction of the gallbladder neck and cystic duct (series 3/ image 25). Associated thickened gallbladder wall and surrounding pericholecystic fluid, compatible with acute cholecystitis.  Common duct measures 8 mm.  No choledocholithiasis is seen.  10 mm lateral interpolar left renal cyst (series 5/ image 35) and additional left renal sinus cyst. Right kidney is within normal limits. No hydronephrosis.  No abdominal ascites.  No suspicious abdominal lymphadenopathy.  No focal osseous lesions.  IMPRESSION: Acute cholecystitis, as described above.  Common duct measures 8 mm.  No choledocholithiasis is seen.  Preliminary report given by Dr. Posey Pronto to the nurse caring for the patient on 04/01/2013 at 9:40 PM.   Electronically Signed   By: Julian Hy M.D.   On: 04/02/2013 08:37   US Abdomen Limited Ruq  04/01/2013   CLINICAL DATA:  Abdominal pain, nausea and vomiting  EXAM: US ABDOMEN LIMITED - RIGHT UPPER QUADRANT  COMPARISON:  None.  FINDINGS: Gallbladder:  There is echogenicity within the  gallbladder representing both gallbladder sludge and small gallstones with some acoustical shadowing. The gallbladder wall is thickened measuring 8.2 mm with some edema of the wall. In addition there is pain over the gallbladder with compression, findings very suggestive of acute cholecystitis. Clinical correlation is recommended.  Common bile duct:  Diameter: The common bile duct is normal measuring 3.9 mm in diameter.  Liver:  The liver has a relatively normal echogenic appearance. However, anteriorly there is a 1.4 cm hyper echogenic focus, most consistent with an incidental hemangioma.  IMPRESSION: 1. Ultrasound findings suspicious for acute cholecystitis. Correlate clinically. Gallstones and gallbladder sludge are noted with gallbladder wall edema. 2. Probable incidental 1.4 cm liver hemangioma. 3. I discussed the findings of this study with Dr. Rogene Houston at the time of interpretation.   Electronically Signed   By: Ivar Drape M.D.   On: 04/01/2013 12:57    ASSESMENT:   *  Choledocholithiasis.  + IOC ERCP 3/18: with sphincterotomy and stone removal LFTs improved.   *  Lap chole 3/18.   *  DM, new diagnosis   PLAN   *  Discharge per surgery and FPTS.  Will sign off.  Pt ought to have screening colonoscopy in the coming  year as over 56 and never had this.  Will let FPTS refer him back to [place] when    Azucena Freed  04/03/2013, 9:35 AM Pager: [phone]

## 2013-04-03 NOTE — Progress Notes (Addendum)
FMTS Attending Note  I personally saw and evaluated the patient. The plan of care was discussed with the resident team. I agree with the assessment and plan as documented by the resident.   Kyllie Pettijohn MD 

## 2013-04-03 NOTE — Progress Notes (Signed)
Patient ID: Brendan [name], male   DOB: 02-11-1955, 58 y.o.   MRN: [id number]  Subjective: Had nausea last night, resolved this AM.  No oral intake yet.  No flatus.  Voided and has ambulated.  Pain controlled with oral pain meds.  Objective:  Vital signs:  Filed Vitals:   04/02/13 1915 04/02/13 2015 04/03/13 0233 04/03/13 0453  BP: 130/88 155/80 131/61 139/75  Pulse: 111 110 96 98  Temp: 99.3 F (37.4 C) 98.2 F (36.8 C) 98.8 F (37.1 C) 98.7 F (37.1 C)  TempSrc:  Oral Oral Oral  Resp: _0 Height:      Weight:  147 lb 4.3 oz (66.8 kg)    SpO2:  96% 100% 94%    Last BM Date: 03/31/13  Intake/Output   Yesterday:  03/18 0701 - 03/19 0700 In: 5429.2 [P.O.:800; I.V.:4529.2; IV Piggyback:100] Out: 0973 [Urine:1450; Drains:95; Blood:25] This shift: I/O last 3 completed shifts: In: 8135.4 [P.O.:800; I.V.:6685.4; IV Piggyback:650] Out: 5329 [JMEQA:8341; Drains:95; Blood:25]    Physical Exam: General: Pt awake/alert/oriented x3 in no acute distress Chest: cta.  No chest wall pain w good excursion CV:  Pulses intact.  Regular rhythm MS: Normal AROM mjr joints.  No obvious deformity Abdomen: Soft.  Nondistended. Mildly tender at incisions only. Incisions are c/d/i.  JP drain with serosanguinous output, no bile.  No evidence of peritonitis.  No incarcerated hernias. Ext:  SCDs BLE.  No mjr edema.  No cyanosis Skin: No petechiae / purpura   Problem List:   Active Problems:   Acute cholecystitis   Transaminitis   Calculus of bile duct without mention of cholecystitis or obstruction    Results:   Labs: Results for orders placed during the hospital encounter of 04/01/13 (from the past 48 hour(s))  COMPREHENSIVE METABOLIC PANEL     Status: Abnormal   Collection Time    04/01/13 11:27 AM      Result Value Ref Range   Sodium 131 (*) 137 - 147 mEq/L   Potassium 4.3  3.7 - 5.3 mEq/L   Chloride 87 (*) 96 - 112 mEq/L   CO2 22  19 - 32 mEq/L   Glucose, Bld 341  (*) 70 - 99 mg/dL   BUN 18  6 - 23 mg/dL   Creatinine, Ser 0.62  0.50 - 1.35 mg/dL   Calcium 9.4  8.4 - 10.5 mg/dL   Total Protein 7.2  6.0 - 8.3 g/dL   Albumin 3.4 (*) 3.5 - 5.2 g/dL   AST 156 (*) 0 - 37 U/L   ALT 334 (*) 0 - 53 U/L   Alkaline Phosphatase 330 (*) 39 - 117 U/L   Total Bilirubin 7.1 (*) 0.3 - 1.2 mg/dL   GFR calc non Af Amer >90  >90 mL/min   GFR calc Af Amer >90  >90 mL/min   Comment: (NOTE)     The eGFR has been calculated using the CKD EPI equation.     This calculation has not been validated in all clinical situations.     eGFR's persistently <90 mL/min signify possible Chronic Kidney     Disease.  CBC WITH DIFFERENTIAL     Status: Abnormal   Collection Time    04/01/13 11:27 AM      Result Value Ref Range   WBC 16.3 (*) 4.0 - 10.5 K/uL   RBC 4.70  4.22 - 5.81 MIL/uL   Hemoglobin 14.7  13.0 - 17.0 g/dL   HCT 40.8  39.0 - 52.0 %   MCV 86.8  78.0 - 100.0 fL   MCH 31.3  26.0 - 34.0 pg   MCHC 36.0  30.0 - 36.0 g/dL   RDW 12.0  11.5 - 15.5 %   Platelets 248  150 - 400 K/uL   Neutrophils Relative % 92 (*) 43 - 77 %   Neutro Abs 14.9 (*) 1.7 - 7.7 K/uL   Lymphocytes Relative 5 (*) 12 - 46 %   Lymphs Abs 0.9  0.7 - 4.0 K/uL   Monocytes Relative 3  3 - 12 %   Monocytes Absolute 0.5  0.1 - 1.0 K/uL   Eosinophils Relative 0  0 - 5 %   Eosinophils Absolute 0.0  0.0 - 0.7 K/uL   Basophils Relative 0  0 - 1 %   Basophils Absolute 0.0  0.0 - 0.1 K/uL  LIPASE, BLOOD     Status: None   Collection Time    04/01/13 11:27 AM      Result Value Ref Range   Lipase 17  11 - 59 U/L  TROPONIN I     Status: None   Collection Time    04/01/13 11:27 AM      Result Value Ref Range   Troponin I <0.30  <0.30 ng/mL   Comment:            Due to the release kinetics of cTnI,     a negative result within the first hours     of the onset of symptoms does not rule out     myocardial infarction with certainty.     If myocardial infarction is still suspected,     repeat the test  at appropriate intervals.  CBG MONITORING, ED     Status: Abnormal   Collection Time    04/01/13 11:47 AM      Result Value Ref Range   Glucose-Capillary 293 (*) 70 - 99 mg/dL  URINALYSIS, ROUTINE W REFLEX MICROSCOPIC     Status: Abnormal   Collection Time    04/01/13 11:55 AM      Result Value Ref Range   Color, Urine ORANGE (*) YELLOW   Comment: BIOCHEMICALS MAY BE AFFECTED BY COLOR   APPearance CLEAR  CLEAR   Specific Gravity, Urine 1.038 (*) 1.005 - 1.030   pH 5.0  5.0 - 8.0   Glucose, UA >1000 (*) NEGATIVE mg/dL   Hgb urine dipstick NEGATIVE  NEGATIVE   Bilirubin Urine LARGE (*) NEGATIVE   Ketones, ur >80 (*) NEGATIVE mg/dL   Protein, ur NEGATIVE  NEGATIVE mg/dL   Urobilinogen, UA 0.2  0.0 - 1.0 mg/dL   Nitrite NEGATIVE  NEGATIVE   Leukocytes, UA NEGATIVE  NEGATIVE  URINE MICROSCOPIC-ADD ON     Status: Abnormal   Collection Time    04/01/13 11:55 AM      Result Value Ref Range   Squamous Epithelial / LPF RARE  RARE   WBC, UA 0-2  <3 WBC/hpf   RBC / HPF 0-2  <3 RBC/hpf   Bacteria, UA FEW (*) RARE  HEMOGLOBIN A1C     Status: Abnormal   Collection Time    04/01/13  2:20 PM      Result Value Ref Range   Hemoglobin A1C 12.0 (*) <5.7 %   Comment: (NOTE)  According to the ADA Clinical Practice Recommendations for 2011, when     HbA1c is used as a screening test:      >=6.5%   Diagnostic of Diabetes Mellitus               (if abnormal result is confirmed)     5.7-6.4%   Increased risk of developing Diabetes Mellitus     References:Diagnosis and Classification of Diabetes Mellitus,Diabetes     CLEX,5170,01(VCBSW 1):S62-S69 and Standards of Medical Care in             Diabetes - 2011,Diabetes HQPR,9163,84 (Suppl 1):S11-S61.   Mean Plasma Glucose 298 (*) <117 mg/dL   Comment: Performed at [facility] MONITORING, ED     Status: Abnormal   Collection Time    04/01/13  2:23 PM       Result Value Ref Range   Glucose-Capillary 326 (*) 70 - 99 mg/dL  GLUCOSE, CAPILLARY     Status: Abnormal   Collection Time    04/01/13  4:37 PM      Result Value Ref Range   Glucose-Capillary 305 (*) 70 - 99 mg/dL  GLUCOSE, CAPILLARY     Status: Abnormal   Collection Time    04/01/13  9:45 PM      Result Value Ref Range   Glucose-Capillary 201 (*) 70 - 99 mg/dL  CBC     Status: Abnormal   Collection Time    04/02/13  3:55 AM      Result Value Ref Range   WBC 13.5 (*) 4.0 - 10.5 K/uL   RBC 4.02 (*) 4.22 - 5.81 MIL/uL   Hemoglobin 12.4 (*) 13.0 - 17.0 g/dL   HCT 34.7 (*) 39.0 - 52.0 %   MCV 86.3  78.0 - 100.0 fL   MCH 30.8  26.0 - 34.0 pg   MCHC 35.7  30.0 - 36.0 g/dL   RDW 11.9  11.5 - 15.5 %   Platelets 168  150 - 400 K/uL   Comment: DELTA CHECK NOTED     REPEATED TO VERIFY  COMPREHENSIVE METABOLIC PANEL     Status: Abnormal   Collection Time    04/02/13  3:55 AM      Result Value Ref Range   Sodium 133 (*) 137 - 147 mEq/L   Potassium 4.4  3.7 - 5.3 mEq/L   Chloride 94 (*) 96 - 112 mEq/L   Comment: DELTA CHECK NOTED   CO2 24  19 - 32 mEq/L   Glucose, Bld 242 (*) 70 - 99 mg/dL   BUN 24 (*) 6 - 23 mg/dL   Creatinine, Ser 0.59  0.50 - 1.35 mg/dL   Calcium 8.9  8.4 - 10.5 mg/dL   Total Protein 5.9 (*) 6.0 - 8.3 g/dL   Albumin 2.7 (*) 3.5 - 5.2 g/dL   AST 45 (*) 0 - 37 U/L   ALT 186 (*) 0 - 53 U/L   Alkaline Phosphatase 232 (*) 39 - 117 U/L   Total Bilirubin 4.5 (*) 0.3 - 1.2 mg/dL   GFR calc non Af Amer >90  >90 mL/min   GFR calc Af Amer >90  >90 mL/min   Comment: (NOTE)     The eGFR has been calculated using the CKD EPI equation.     This calculation has not been validated in all clinical situations.     eGFR's persistently <90 mL/min signify possible Chronic Kidney     Disease.  SURGICAL PCR  SCREEN     Status: None   Collection Time    04/02/13  5:23 AM      Result Value Ref Range   MRSA, PCR NEGATIVE  NEGATIVE   Staphylococcus aureus NEGATIVE  NEGATIVE    Comment:            The Xpert SA Assay (FDA     approved for NASAL specimens     in patients over 68 years of age),     is one component of     a comprehensive surveillance     program.  Test performance has     been validated by [employer] for patients greater     than or equal to 12 year old.     It is not intended     to diagnose infection nor to     guide or monitor treatment.  GLUCOSE, CAPILLARY     Status: Abnormal   Collection Time    04/02/13  6:23 AM      Result Value Ref Range   Glucose-Capillary 262 (*) 70 - 99 mg/dL   Comment 1 Documented in Chart     Comment 2 Notify RN    GLUCOSE, CAPILLARY     Status: Abnormal   Collection Time    04/02/13 10:08 AM      Result Value Ref Range   Glucose-Capillary 249 (*) 70 - 99 mg/dL   Comment 1 Documented in Chart     Comment 2 Notify RN    GLUCOSE, CAPILLARY     Status: Abnormal   Collection Time    04/02/13 12:01 PM      Result Value Ref Range   Glucose-Capillary 270 (*) 70 - 99 mg/dL  GLUCOSE, CAPILLARY     Status: Abnormal   Collection Time    04/02/13  5:32 PM      Result Value Ref Range   Glucose-Capillary 180 (*) 70 - 99 mg/dL  GLUCOSE, CAPILLARY     Status: Abnormal   Collection Time    04/02/13  7:12 PM      Result Value Ref Range   Glucose-Capillary 179 (*) 70 - 99 mg/dL  GLUCOSE, CAPILLARY     Status: Abnormal   Collection Time    04/02/13  9:47 PM      Result Value Ref Range   Glucose-Capillary 200 (*) 70 - 99 mg/dL   Comment 1 Notify RN    CBC     Status: Abnormal   Collection Time    04/03/13  5:40 AM      Result Value Ref Range   WBC 8.8  4.0 - 10.5 K/uL   RBC 3.65 (*) 4.22 - 5.81 MIL/uL   Hemoglobin 11.3 (*) 13.0 - 17.0 g/dL   HCT 32.1 (*) 39.0 - 52.0 %   MCV 87.9  78.0 - 100.0 fL   MCH 31.0  26.0 - 34.0 pg   MCHC 35.2  30.0 - 36.0 g/dL   RDW 12.4  11.5 - 15.5 %   Platelets 154  150 - 400 K/uL  COMPREHENSIVE METABOLIC PANEL     Status: Abnormal   Collection Time    04/03/13  5:40 AM       Result Value Ref Range   Sodium 134 (*) 137 - 147 mEq/L   Potassium 3.7  3.7 - 5.3 mEq/L   Chloride 98  96 - 112 mEq/L   CO2 20  19 - 32 mEq/L  Glucose, Bld 194 (*) 70 - 99 mg/dL   BUN 17  6 - 23 mg/dL   Creatinine, Ser 0.42 (*) 0.50 - 1.35 mg/dL   Calcium 8.3 (*) 8.4 - 10.5 mg/dL   Total Protein 5.7 (*) 6.0 - 8.3 g/dL   Albumin 2.2 (*) 3.5 - 5.2 g/dL   AST 30  0 - 37 U/L   ALT 100 (*) 0 - 53 U/L   Alkaline Phosphatase 152 (*) 39 - 117 U/L   Total Bilirubin 2.3 (*) 0.3 - 1.2 mg/dL   GFR calc non Af Amer >90  >90 mL/min   GFR calc Af Amer >90  >90 mL/min   Comment: (NOTE)     The eGFR has been calculated using the CKD EPI equation.     This calculation has not been validated in all clinical situations.     eGFR's persistently <90 mL/min signify possible Chronic Kidney     Disease.    Imaging / Studies: Dg Cholangiogram Operative  04/02/2013   CLINICAL DATA:  Cholecystitis.  EXAM: INTRAOPERATIVE CHOLANGIOGRAM  TECHNIQUE: Cholangiographic images from the C-arm fluoroscopic device were submitted for interpretation post-operatively. Please see the procedural report for the amount of contrast and the fluoroscopy time utilized.  COMPARISON:  MR MRCP WO/W CM dated 04/01/2013  FINDINGS: The contrast injection demonstrates extravasation outside of the biliary system. There is contrast along the right hepatic lobe. There is filling of the gallbladder and the common bile duct. There is a persistent filling defect in the distal common bile duct. No significant drainage into the duodenum.  IMPRESSION: There is a persistent filling defect in the distal common bile duct without significant drainage into the duodenum. Findings are concerning for an obstructing stone.  Extravasation of contrast outside of the biliary system.   Electronically Signed   By: Markus Daft M.D.   On: 04/02/2013 09:43   Dg Ercp  04/02/2013   CLINICAL DATA:  Retained stones.  EXAM: ERCP  TECHNIQUE: Multiple spot images  obtained with the fluoroscopic device and submitted for interpretation post-procedure.  COMPARISON:  DG CHOLANGIOGRAM OPERATIVE dated 04/02/2013; MR MRCP WO/W CM dated 04/01/2013  FINDINGS: Two spot fluoroscopic images from an ERCP are submitted. These demonstrate possible mild wall irregularity, but no gross filling defects. According to the procedure note, the fluoroscopic images were mistakenly not captured, and sphincterotomy and balloon stone extraction was performed.  IMPRESSION: Limited spot views from ERCP for sphincterotomy and stone extraction.  These images were submitted for radiologic interpretation only. Please see the procedural report for the amount of contrast and the fluoroscopy time utilized.   Electronically Signed   By: Camie Patience M.D.   On: 04/02/2013 20:38   Mr Abd W/wo Cm/mrcp  04/02/2013   CLINICAL DATA:  Right upper quadrant pain, ultrasound suspicious for acute cholecystitis  EXAM: MRI ABDOMEN WITHOUT AND WITH CONTRAST (INCLUDING MRCP)  TECHNIQUE: Multiplanar multisequence MR imaging of the abdomen was performed both before and after the administration of intravenous contrast. Heavily T2-weighted images of the biliary and pancreatic ducts were obtained, and three-dimensional MRCP images were rendered by post processing.  CONTRAST:  41m MULTIHANCE GADOBENATE DIMEGLUMINE 529 MG/ML IV SOLN  COMPARISON:  Abdominal ultrasound dated 04/01/2013  FINDINGS: Mild patchy opacities at the lung bases (series 5/image 2), likely atelectasis, although poorly evaluated on MRI.  Liver is notable for a 10 mm T2 hyperintense lesion in the lateral segment left hepatic lobe (series 3/image 23), likely reflecting a benign hemangioma.  Spleen,  pancreas, and adrenal glands are within normal limits.  Distended gallbladder with layering gallstones and gallbladder sludge, including a 6 mm stone at the junction of the gallbladder neck and cystic duct (series 3/ image 25). Associated thickened gallbladder wall and  surrounding pericholecystic fluid, compatible with acute cholecystitis.  Common duct measures 8 mm.  No choledocholithiasis is seen.  10 mm lateral interpolar left renal cyst (series 5/ image 35) and additional left renal sinus cyst. Right kidney is within normal limits. No hydronephrosis.  No abdominal ascites.  No suspicious abdominal lymphadenopathy.  No focal osseous lesions.  IMPRESSION: Acute cholecystitis, as described above.  Common duct measures 8 mm.  No choledocholithiasis is seen.  Preliminary report given by Dr. Posey Pronto to the nurse caring for the patient on 04/01/2013 at 9:40 PM.   Electronically Signed   By: Julian Hy M.D.   On: 04/02/2013 08:37   US Abdomen Limited Ruq  04/01/2013   CLINICAL DATA:  Abdominal pain, nausea and vomiting  EXAM: US ABDOMEN LIMITED - RIGHT UPPER QUADRANT  COMPARISON:  None.  FINDINGS: Gallbladder:  There is echogenicity within the gallbladder representing both gallbladder sludge and small gallstones with some acoustical shadowing. The gallbladder wall is thickened measuring 8.2 mm with some edema of the wall. In addition there is pain over the gallbladder with compression, findings very suggestive of acute cholecystitis. Clinical correlation is recommended.  Common bile duct:  Diameter: The common bile duct is normal measuring 3.9 mm in diameter.  Liver:  The liver has a relatively normal echogenic appearance. However, anteriorly there is a 1.4 cm hyper echogenic focus, most consistent with an incidental hemangioma.  IMPRESSION: 1. Ultrasound findings suspicious for acute cholecystitis. Correlate clinically. Gallstones and gallbladder sludge are noted with gallbladder wall edema. 2. Probable incidental 1.4 cm liver hemangioma. 3. I discussed the findings of this study with Dr. Rogene Houston at the time of interpretation.   Electronically Signed   By: Ivar Drape M.D.   On: 04/01/2013 12:57    Medications / Allergies: per chart  Antibiotics: Anti-infectives    Start     Dose/Rate Route Frequency Ordered Stop   04/01/13 1630  Ampicillin-Sulbactam (UNASYN) 3 g in sodium chloride 0.9 % 100 mL IVPB     3 g 100 mL/hr over 60 Minutes Intravenous 4 times per day 04/01/13 1403        Assessment/Plan Acute cholecystitis  S/p laparoscopic cholecystectomy with IOC---Dr. T Cornett 04/02/13 -mobilize -advance diet -IS -pain control -drain care Choledocholithiasis S/p ERCP with stone removal and sphincterotomy---Dr. Deatra Ina 04/02/13  VTE prophylaxis -SCDs, lovenox DM type II, poorly controlled -appreciate family medicine assistance, pt to follow up with Dr. Lacinda Axon  -plan to start lantus  Dispo: discharge today if okay with GI  Erby Pian, [facility] Surgery Pager [phone] Office [phone]  04/03/2013 7:42 AM

## 2013-04-03 NOTE — Progress Notes (Signed)
See how he does this am.  D/C later today or Friday.

## 2013-04-03 NOTE — Progress Notes (Signed)
I agree with the above documentation, including the assessment and plan. S/p ERCP with stone extraction Outpatient colonoscopy for screening recommended

## 2013-04-03 NOTE — Progress Notes (Signed)
Family Medicine Teaching Service Consult Note Intern Pager: [phone]  Patient name: Brendan [name] Medical record number: [id number] Date of birth: 1955/05/21 Age: 58 y.o. Gender: male  Primary Care Provider: Thersa Salt, DO Consultants: Family medicine  Code Status: none  Pt Overview and Major Events to Date:  3/18: admitted for acute cholecystitis with a lap chole.  3/19: Received 11 U of novolog.   Assessment and Plan: Brendan [name] is a 58 y.o. male s/p laparoscopic cholecystectomy and IOC this hospitalization with uncontrolled blood sugars. PMH is significant for DM2, ED, HTN.   # DM2: Blood sugar ranges between 170-270. I think he would be ok to dc today if he has close follow up with [place] in clinic.  - started 10 U Lantus today  - continue SSI  - Recommend NPH 5 mg BID. [employer] or [employer] have the cheaper versions of this product.  - patient already has glucose meter and strips.  - Will have him follow up in the diabetes clinic with [facility]  - will continue Metformin on DC  - discontinue glipizide on discharge   #Recent weight loss: could be associated with his diabetes but will check a TSH. Has never had a colonoscopy.  - TSH pending  - needs outpatient colonoscopy   # Alcohol history: concerning for possible withdrawal due to occasional heavy drinking  - Consider CIWA protocol   # h/o HTN: normotensive thus far this hospitalization   FEN/GI: per surgery, Carb modified diet  Prophylaxis: SCDs in place  Disposition: pending   Subjective: Patient feeling well this AM. He is happy to be able to eat breakfast today. He is comfortable with checking his sugars and understands how to deliver the insulin.   Objective: Temp:  [97.5 F (36.4 C)-99.8 F (37.7 C)] 98.7 F (37.1 C) (03/19 0453) Pulse Rate:  [96-111] 98 (03/19 0453) Resp:  [14-21] 16 (03/19 0453) BP: (126-155)/(61-88) 139/75 mmHg (03/19 0453) SpO2:  [94 %-100 %] 94 % (03/19 0453) Weight:  [147 lb 4.3 oz  (66.8 kg)] 147 lb 4.3 oz (66.8 kg) (03/18 2015) Physical Exam: Gen: NAD, alert, cooperative with exam  HEENT: NCAT, EOMI,  CV: RRR, good S1/S2, no murmur  Resp: CTABL, no wheezes, non-labored  Abd: Soft, tender to light palaption post op Ext: No edema, warm, wearing SCDs, 2+ DP pulses, no lesions on feet  Neuro: Alert and oriented, no focal deficits.   Laboratory:  Recent Labs Lab 04/01/13 1127 04/02/13 0355 04/03/13 0540  WBC 16.3* 13.5* 8.8  HGB 14.7 12.4* 11.3*  HCT 40.8 34.7* 32.1*  PLT 248 168 154    Recent Labs Lab 04/01/13 1127 04/02/13 0355 04/03/13 0540  NA 131* 133* 134*  K 4.3 4.4 3.7  CL 87* 94* 98  CO2 22 24 20   BUN 18 24* 17  CREATININE 0.62 0.59 0.42*  CALCIUM 9.4 8.9 8.3*  PROT 7.2 5.9* 5.7*  BILITOT 7.1* 4.5* 2.3*  ALKPHOS 330* 232* 152*  ALT 334* 186* 100*  AST 156* 45* 30  GLUCOSE 341* 242* 194*    Recent Labs Lab 04/02/13 1201 04/02/13 1732 04/02/13 1912 04/02/13 2147 04/03/13 0731  GLUCAP 270* 180* 179* 200* 176*   Imaging/Diagnostic Tests:   Rosemarie Ax, MD 04/03/2013, 8:50 AM PGY-1, [place] Intern pager: [phone], text pages welcome

## 2013-04-04 ENCOUNTER — Encounter (HOSPITAL_COMMUNITY): Payer: Self-pay | Admitting: Surgery

## 2013-04-04 DIAGNOSIS — E119 Type 2 diabetes mellitus without complications: Secondary | ICD-10-CM

## 2013-04-04 LAB — GLUCOSE, CAPILLARY
GLUCOSE-CAPILLARY: 160 mg/dL — AB (ref 70–99)
Glucose-Capillary: 181 mg/dL — ABNORMAL HIGH (ref 70–99)

## 2013-04-04 MED ORDER — INSULIN NPH (HUMAN) (ISOPHANE) 100 UNIT/ML ~~LOC~~ SUSP: 5.0000 [IU] | Freq: Two times a day (BID) | SUBCUTANEOUS | Status: DC

## 2013-04-04 MED ORDER — AMOXICILLIN-POT CLAVULANATE 875-125 MG PO TABS: 1.0000 | ORAL_TABLET | Freq: Two times a day (BID) | ORAL | Status: AC

## 2013-04-04 MED ORDER — "INSULIN SYRINGE 28G X 1/2"" 0.5 ML MISC": 1.0000 | Freq: Two times a day (BID) | Status: DC

## 2013-04-04 MED ORDER — OXYCODONE-ACETAMINOPHEN 5-325 MG PO TABS: 1.0000 | ORAL_TABLET | ORAL | Status: DC | PRN

## 2013-04-04 NOTE — Consult Note (Signed)
FMTS ATTENDING  NOTE Brendan Cookston,MD I have discussed this patient with the resident. I agree with the resident's findings, assessment and care plan.   

## 2013-04-04 NOTE — Discharge Summary (Signed)
Physician Discharge Summary  Brendan [name] [id number] DOB: September 27, 1955 DOA: 04/01/2013  PCP: Thersa Salt, DO  Consultation: family medicine---Dr. Dossie Arbour    GI---Dr. Pyrtle   Admit date: 04/01/2013 Discharge date: 04/04/2013  Recommendations for Outpatient Follow-up:    Follow-up Information   Follow up with Leavy Cella, [facility] On 04/10/2013. (2:15 )    Specialty:  Pharmacist   Contact information:   [place] [address] [phone]       Follow up with Thersa Salt, DO On 04/08/2013. (3:00)    Specialty:  Family Medicine   Contact information:   [place]  [address] [phone]       Follow up with [place] On 04/29/2013. (arrive by 1PM for a 1:30PM appt)    Contact information:   [address] [place]   [place] [address] [phone]      Discharge Diagnoses:  1. Acute cholecystitis  2. Choledocholithiasis 3. Type II DM, poorly controlled   Surgical Procedure: laparoscopic cholecystectomy with IOC---Dr. Brantley Stage 04/02/13    ERCP with stone extraction and sphincterotomy---Dr. Deatra Ina 04/02/13  Discharge Condition: stable Disposition: home  Diet recommendation: carb modified   Filed Weights   04/01/13 1114 04/01/13 1604 04/02/13 2015  Weight: 145 lb (65.772 kg) 132 lb 0.9 oz (59.9 kg) 147 lb 4.3 oz (66.8 kg)    Filed Vitals:   04/04/13 0618  BP: 148/82  Pulse: 93  Temp: 98.3 F (36.8 C)  Resp: 18    Hospital Course:  Brendan [name] is a 58 year old male with a history of diabetes mellitus who presents to [facility] with RUQ abdominal pain.  His work up showed cholecystitis and abnormal LFTs.  GI was therefore consulted.  He underwent the procedure listed above the following day.  His IOC was positive and therefore he had an ERCP with sphincterotomy and stone extraction.  Family medicine was consulted to help with management of diabetes.  His A1C was 12.  He was started on insulin.   Recommendations at discharge is to resume metformin, DC glipizine and start NPH which he can obtain at a lower cost at [facility].  On POD#2 the patient was tolerating a diet, passing flatus, voiding, pain controlled with oral pain medication, drain without bile.  He was therefore felt stable for discharge.  We discussed warning signs that warrant immediate attention.  medication risks, benefits and therapeutic alternatives were discussed.   He was given an Rx for NPH x1 month.  He will follow up in the family medicine clinic and an appointment has been established.     Physical Exam:  General: Pt awake/alert/oriented x3 in no acute distress  Chest: cta. No chest wall pain w good excursion  CV: Pulses intact. Regular rhythm  MS: Normal AROM mjr joints. No obvious deformity  Abdomen: Soft. Nondistended. Mildly tender at incisions only. Incisions are c/d/i. JP drain with serosanguinous output, no bile. No evidence of peritonitis. No incarcerated hernias.  Ext: SCDs BLE. No mjr edema. No cyanosis  Skin: No petechiae / purpura    Discharge Instructions   Future Appointments Provider Department Dept Phone   04/08/2013 3:00 PM [place] [phone]   04/10/2013 2:15 PM Leavy Cella, [place] [phone]   04/29/2013 1:30 PM [facility], [place]  Medication List    STOP taking these medications       glipiZIDE 5 MG tablet  Commonly known as:  GLUCOTROL      TAKE these medications       amoxicillin-clavulanate 875-125 MG per tablet  Commonly known as:  AUGMENTIN  Take 1 tablet by mouth 2 (two) times daily.     glucose blood test strip  Commonly known as:  RELION PRIME TEST  Use to test blood sugar up to 4 times daily.     insulin NPH Human 100 UNIT/ML injection  Commonly known as:  HUMULIN N  Inject 5 Units into the skin 2 (two) times daily before a meal.      metFORMIN 500 MG tablet  Commonly known as:  GLUCOPHAGE  Take 1 tablet (500 mg total) by mouth 2 (two) times daily with a meal.     oxyCODONE-acetaminophen 5-325 MG per tablet  Commonly known as:  PERCOCET/ROXICET  Take 1 tablet by mouth every 4 (four) hours as needed for moderate pain.     RELION PRIME MONITOR Devi  1 Device by Does not apply route as directed.     ReliOn Ultra Thin Lancets Misc  Use to test blood sugar up to 4 times daily.           Follow-up Information   Follow up with Leavy Cella, [facility] On 04/10/2013. (2:15 )    Specialty:  Pharmacist   Contact information:   [place] [address] [phone]       Follow up with Thersa Salt, DO On 04/08/2013. (3:00)    Specialty:  Family Medicine   Contact information:   [place] [address] [phone]       Follow up with [place] On 04/29/2013. (arrive by 1PM for a 1:30PM appt)    Contact information:   [place]   [place] [address] [phone]        The results of significant diagnostics from this hospitalization (including imaging, microbiology, ancillary and laboratory) are listed below for reference.    Significant Diagnostic Studies: Dg Cholangiogram Operative  04/02/2013   CLINICAL DATA:  Cholecystitis.  EXAM: INTRAOPERATIVE CHOLANGIOGRAM  TECHNIQUE: Cholangiographic images from the C-arm fluoroscopic device were submitted for interpretation post-operatively. Please see the procedural report for the amount of contrast and the fluoroscopy time utilized.  COMPARISON:  MR MRCP WO/W CM dated 04/01/2013  FINDINGS: The contrast injection demonstrates extravasation outside of the biliary system. There is contrast along the right hepatic lobe. There is filling of the gallbladder and the common bile duct. There is a persistent filling defect in the distal common bile duct. No significant drainage into the duodenum.   IMPRESSION: There is a persistent filling defect in the distal common bile duct without significant drainage into the duodenum. Findings are concerning for an obstructing stone.  Extravasation of contrast outside of the biliary system.   Electronically Signed   By: Markus Daft M.D.   On: 04/02/2013 09:43   Dg Ercp  04/02/2013   CLINICAL DATA:  Retained stones.  EXAM: ERCP  TECHNIQUE: Multiple spot images obtained with the fluoroscopic device and submitted for interpretation post-procedure.  COMPARISON:  DG CHOLANGIOGRAM OPERATIVE dated 04/02/2013; MR MRCP WO/W CM dated 04/01/2013  FINDINGS: Two spot fluoroscopic images from an ERCP are submitted. These demonstrate possible mild wall irregularity, but no gross filling defects. According to the  procedure note, the fluoroscopic images were mistakenly not captured, and sphincterotomy and balloon stone extraction was performed.  IMPRESSION: Limited spot views from ERCP for sphincterotomy and stone extraction.  These images were submitted for radiologic interpretation only. Please see the procedural report for the amount of contrast and the fluoroscopy time utilized.   Electronically Signed   By: Camie Patience M.D.   On: 04/02/2013 20:38   Mr Abd W/wo Cm/mrcp  04/02/2013   CLINICAL DATA:  Right upper quadrant pain, ultrasound suspicious for acute cholecystitis  EXAM: MRI ABDOMEN WITHOUT AND WITH CONTRAST (INCLUDING MRCP)  TECHNIQUE: Multiplanar multisequence MR imaging of the abdomen was performed both before and after the administration of intravenous contrast. Heavily T2-weighted images of the biliary and pancreatic ducts were obtained, and three-dimensional MRCP images were rendered by post processing.  CONTRAST:  10mL MULTIHANCE GADOBENATE DIMEGLUMINE 529 MG/ML IV SOLN  COMPARISON:  Abdominal ultrasound dated 04/01/2013  FINDINGS: Mild patchy opacities at the lung bases (series 5/image 2), likely atelectasis, although poorly evaluated on MRI.  Liver is notable for a  10 mm T2 hyperintense lesion in the lateral segment left hepatic lobe (series 3/image 23), likely reflecting a benign hemangioma.  Spleen, pancreas, and adrenal glands are within normal limits.  Distended gallbladder with layering gallstones and gallbladder sludge, including a 6 mm stone at the junction of the gallbladder neck and cystic duct (series 3/ image 25). Associated thickened gallbladder wall and surrounding pericholecystic fluid, compatible with acute cholecystitis.  Common duct measures 8 mm.  No choledocholithiasis is seen.  10 mm lateral interpolar left renal cyst (series 5/ image 35) and additional left renal sinus cyst. Right kidney is within normal limits. No hydronephrosis.  No abdominal ascites.  No suspicious abdominal lymphadenopathy.  No focal osseous lesions.  IMPRESSION: Acute cholecystitis, as described above.  Common duct measures 8 mm.  No choledocholithiasis is seen.  Preliminary report given by Dr. Posey Pronto to the nurse caring for the patient on 04/01/2013 at 9:40 PM.   Electronically Signed   By: Julian Hy M.D.   On: 04/02/2013 08:37   US Abdomen Limited Ruq  04/01/2013   CLINICAL DATA:  Abdominal pain, nausea and vomiting  EXAM: US ABDOMEN LIMITED - RIGHT UPPER QUADRANT  COMPARISON:  None.  FINDINGS: Gallbladder:  There is echogenicity within the gallbladder representing both gallbladder sludge and small gallstones with some acoustical shadowing. The gallbladder wall is thickened measuring 8.2 mm with some edema of the wall. In addition there is pain over the gallbladder with compression, findings very suggestive of acute cholecystitis. Clinical correlation is recommended.  Common bile duct:  Diameter: The common bile duct is normal measuring 3.9 mm in diameter.  Liver:  The liver has a relatively normal echogenic appearance. However, anteriorly there is a 1.4 cm hyper echogenic focus, most consistent with an incidental hemangioma.  IMPRESSION: 1. Ultrasound findings suspicious  for acute cholecystitis. Correlate clinically. Gallstones and gallbladder sludge are noted with gallbladder wall edema. 2. Probable incidental 1.4 cm liver hemangioma. 3. I discussed the findings of this study with Dr. Rogene Houston at the time of interpretation.   Electronically Signed   By: Ivar Drape M.D.   On: 04/01/2013 12:57    Microbiology: Recent Results (from the past 240 hour(s))  SURGICAL PCR SCREEN     Status: None   Collection Time    04/02/13  5:23 AM      Result Value Ref Range Status   MRSA, PCR NEGATIVE  NEGATIVE Final  Staphylococcus aureus NEGATIVE  NEGATIVE Final   Comment:            The Xpert SA Assay (FDA     approved for NASAL specimens     in patients over 77 years of age),     is one component of     a comprehensive surveillance     program.  Test performance has     been validated by [employer] for patients greater     than or equal to 54 year old.     It is not intended     to diagnose infection nor to     guide or monitor treatment.     Labs: Basic Metabolic Panel:  Recent Labs Lab 04/01/13 1127 04/02/13 0355 04/03/13 0540  NA 131* 133* 134*  K 4.3 4.4 3.7  CL 87* 94* 98  CO2 22 24 20   GLUCOSE 341* 242* 194*  BUN 18 24* 17  CREATININE 0.62 0.59 0.42*  CALCIUM 9.4 8.9 8.3*   Liver Function Tests:  Recent Labs Lab 04/01/13 1127 04/02/13 0355 04/03/13 0540  AST 156* 45* 30  ALT 334* 186* 100*  ALKPHOS 330* 232* 152*  BILITOT 7.1* 4.5* 2.3*  PROT 7.2 5.9* 5.7*  ALBUMIN 3.4* 2.7* 2.2*    Recent Labs Lab 04/01/13 1127  LIPASE 17   No results found for this basename: AMMONIA,  in the last 168 hours CBC:  Recent Labs Lab 04/01/13 1127 04/02/13 0355 04/03/13 0540  WBC 16.3* 13.5* 8.8  NEUTROABS 14.9*  --   --   HGB 14.7 12.4* 11.3*  HCT 40.8 34.7* 32.1*  MCV 86.8 86.3 87.9  PLT 248 168 154   Cardiac Enzymes:  Recent Labs Lab 04/01/13 1127  TROPONINI <0.30   BNP: BNP (last 3 results) No results found for this  basename: PROBNP,  in the last 8760 hours CBG:  Recent Labs Lab 04/02/13 2147 04/03/13 0731 04/03/13 1236 04/03/13 1710 04/03/13 2137  GLUCAP 200* 176* 211* 274* 213*    Active Problems:   Acute cholecystitis   Transaminitis   Calculus of bile duct without mention of cholecystitis or obstruction   Time coordinating discharge: <30 mins  Signed:  Loki Wuthrich, ANP-BC

## 2013-04-04 NOTE — Progress Notes (Signed)
AVS discharge instructions were given and went over with patient. Patient was given perscription for percocet, augmentin, insulin syringes, and NPH. Patient was also given information and medication assist card. Patient stated that he did not have any questions. Waiting for volunteers to come and assist to transportation.

## 2013-04-04 NOTE — Discharge Summary (Signed)
Discharge doing well. Follow up arranged.

## 2013-04-04 NOTE — Consult Note (Signed)
Family Medicine Teaching Service Daily Progress Note Intern Pager: [phone]  Patient name: Brendan [name] Medical record number: [id number] Date of birth: 04-04-1955 Age: 58 y.o. Gender: male  Primary Care Provider: Thersa Salt, DO  Pt Overview and Major Events to Date:  3/18: admitted for acute cholecystitis with a lap chole.  3/19: Received 11 U of novolog.   Assessment and Plan: Brendan [name] is a 58 y.o. male s/p laparoscopic cholecystectomy and IOC this hospitalization with uncontrolled blood sugars. PMH is significant for DM2, ED, HTN.   # DM2: Blood sugar ranges between 160-274. Feeling well and understands how to check his sugars.  He has close f/u scheduled in the [facility].  - 10 U Lantus  - continue SSI  - Recommend NPH 5 mg BID. [employer] or [employer] have the cheaper versions of this product.  - will continue Metformin on DC  - discontinue glipizide on discharge   #Recent weight loss: Has never had a colonoscopy.  - TSH 1.207 (nml)  - needs outpatient colonoscopy   # Alcohol history: concerning for possible withdrawal due to occasional heavy drinking  - Consider CIWA protocol   # h/o HTN: normotensive thus far this hospitalization   FEN/GI: per surgery, Carb modified diet  Prophylaxis: SCDs in place  Disposition: dc today   Subjective: patient feeling well. He is able to tolerate PO.   Objective: Temp:  [97.8 F (36.6 C)-98.4 F (36.9 C)] 98.3 F (36.8 C) (03/20 0618) Pulse Rate:  [88-93] 93 (03/20 0618) Resp:  [16-18] 18 (03/20 0618) BP: (117-148)/(67-82) 148/82 mmHg (03/20 0618) SpO2:  [92 %-100 %] 96 % (03/20 0618) Physical Exam: Gen: NAD, alert, cooperative with exam  HEENT: NCAT, EOMI,  CV: RRR, good S1/S2, no murmur  Resp: CTABL, no wheezes, non-labored  Abd: Soft, tender to light palaption post op  Ext: No edema, warm, wearing SCDs, 2+ DP pulses, no lesions on feet  Neuro: Alert and oriented, no focal deficits.   Laboratory:  Recent Labs Lab  04/01/13 1127 04/02/13 0355 04/03/13 0540  WBC 16.3* 13.5* 8.8  HGB 14.7 12.4* 11.3*  HCT 40.8 34.7* 32.1*  PLT 248 168 154    Recent Labs Lab 04/01/13 1127 04/02/13 0355 04/03/13 0540  NA 131* 133* 134*  K 4.3 4.4 3.7  CL 87* 94* 98  CO2 22 24 20   BUN 18 24* 17  CREATININE 0.62 0.59 0.42*  CALCIUM 9.4 8.9 8.3*  PROT 7.2 5.9* 5.7*  BILITOT 7.1* 4.5* 2.3*  ALKPHOS 330* 232* 152*  ALT 334* 186* 100*  AST 156* 45* 30  GLUCOSE 341* 242* 194*    Recent Labs Lab 04/03/13 1236 04/03/13 1710 04/03/13 2137 04/04/13 0805 04/04/13 1139  GLUCAP 211* 274* 213* 160* 181*     Imaging/Diagnostic Tests:   Rosemarie Ax, MD 04/04/2013, 9:54 AM PGY-1, [place] Intern pager: [phone], text pages welcome

## 2013-04-04 NOTE — Anesthesia Postprocedure Evaluation (Signed)
  Anesthesia Post-op Note  Patient: Brendan [name]  Procedure(s) Performed: Procedure(s): ENDOSCOPIC RETROGRADE CHOLANGIOPANCREATOGRAPHY (ERCP) (N/A)  Patient Location: PACU  Anesthesia Type:General  Level of Consciousness: awake and alert   Airway and Oxygen Therapy: Patient Spontanous Breathing  Post-op Pain: none  Post-op Assessment: Post-op Vital signs reviewed  Post-op Vital Signs: stable  Complications: No apparent anesthesia complications

## 2013-04-04 NOTE — Discharge Instructions (Signed)
YOU CAN GET INSULIN AT [facility] AT A REDUCED PRICE  YOU NEED A SCREENING COLONOSCOPY IN THE NEAR FUTURE  YOU NEED TO FOLLOW UP WITH FAMILY MEDICINE TO CONTROL YOUR DIABETES.  LAPAROSCOPIC SURGERY: POST OP INSTRUCTIONS  1. DIET: Follow a light bland diet the first 24 hours after arrival home, such as soup, liquids, crackers, etc.  Be sure to include lots of fluids daily.  Avoid fast food or heavy meals as your are more likely to get nauseated.  Eat a low fat the next few days after surgery.   2. Take your usually prescribed home medications unless otherwise directed. 3. PAIN CONTROL: a. Pain is best controlled by a usual combination of three different methods TOGETHER: i. Ice/Heat ii. Over the counter pain medication iii. Prescription pain medication b. Most patients will experience some swelling and bruising around the incisions.  Ice packs or heating pads (30-60 minutes up to 6 times a day) will help. Use ice for the first few days to help decrease swelling and bruising, then switch to heat to help relax tight/sore spots and speed recovery.  Some people prefer to use ice alone, heat alone, alternating between ice & heat.  Experiment to what works for you.  Swelling and bruising can take several weeks to resolve.   c. It is helpful to take an over-the-counter pain medication regularly for the first few weeks.  Choose one of the following that works best for you: i. Naproxen (Aleve, etc)  Two 220mg  tabs twice a day ii. Ibuprofen (Advil, etc) Three 200mg  tabs four times a day (every meal & bedtime) iii. Acetaminophen (Tylenol, etc) 500-650mg  four times a day (every meal & bedtime) d. A  prescription for pain medication (such as oxycodone, hydrocodone, etc) should be given to you upon discharge.  Take your pain medication as prescribed.  i. If you are having problems/concerns with the prescription medicine (does not control pain, nausea, vomiting, rash, itching, etc), please call us [phone]  to see if we need to switch you to a different pain medicine that will work better for you and/or control your side effect better. ii. If you need a refill on your pain medication, please contact your pharmacy.  They will contact our office to request authorization. Prescriptions will not be filled after 5 pm or on week-ends. 4. Avoid getting constipated.  Between the surgery and the pain medications, it is common to experience some constipation.  Increasing fluid intake and taking a fiber supplement (such as Metamucil, Citrucel, FiberCon, MiraLax, etc) 1-2 times a day regularly will usually help prevent this problem from occurring.  A mild laxative (prune juice, Milk of Magnesia, MiraLax, etc) should be taken according to package directions if there are no bowel movements after 48 hours.   5. Watch out for diarrhea.  If you have many loose bowel movements, simplify your diet to bland foods & liquids for a few days.  Stop any stool softeners and decrease your fiber supplement.  Switching to mild anti-diarrheal medications (Kayopectate, Pepto Bismol) can help.  If this worsens or does not improve, please call us. 6. Wash / shower every day.  You may shower over the dressings as they are waterproof.  Continue to shower over incision(s) after the dressing is off. 7. Remove your waterproof bandages 5 days after surgery.  You may leave the incision open to air.  You may replace a dressing/Band-Aid to cover the incision for comfort if you wish.  8. ACTIVITIES as tolerated:  a. You may resume regular (light) daily activities beginning the next day--such as daily self-care, walking, climbing stairs--gradually increasing activities as tolerated.  If you can walk 30 minutes without difficulty, it is safe to try more intense activity such as jogging, treadmill, bicycling, low-impact aerobics, swimming, etc. b. Save the most intensive and strenuous activity for last such as sit-ups, heavy lifting, contact sports, etc   Refrain from any heavy lifting or straining until you are off narcotics for pain control.   c. DO NOT PUSH THROUGH PAIN.  Let pain be your guide: If it hurts to do something, don't do it.  Pain is your body warning you to avoid that activity for another week until the pain goes down. d. You may drive when you are no longer taking prescription pain medication, you can comfortably wear a seatbelt, and you can safely maneuver your car and apply brakes. e. Dennis Bast may have sexual intercourse when it is comfortable.  9. FOLLOW UP in our office a. Please call [facility] at [phone] to set up an appointment to see your surgeon in the office for a follow-up appointment approximately 2-3 weeks after your surgery. b. Make sure that you call for this appointment the day you arrive home to insure a convenient appointment time. 10. IF YOU HAVE DISABILITY OR FAMILY LEAVE FORMS, BRING THEM TO THE OFFICE FOR PROCESSING.  DO NOT GIVE THEM TO YOUR DOCTOR.   WHEN TO CALL us [phone]: 1. Poor pain control 2. Reactions / problems with new medications (rash/itching, nausea, etc)  3. Fever over 101.5 F (38.5 C) 4. Inability to urinate 5. Nausea and/or vomiting 6. Worsening swelling or bruising 7. Continued bleeding from incision. 8. Increased pain, redness, or drainage from the incision   The clinic staff is available to answer your questions during regular business hours (8:30am-5pm).  Please dont hesitate to call and ask to speak to one of our nurses for clinical concerns.   If you have a medical emergency, go to the nearest emergency room or call 911.  A surgeon from [facility] is always on call at the [facility], [place], [place], [place], [place]  [address] ? MAIN: [phone] ? TOLL FREE: [phone] ?  FAX [phone] [email or web address]

## 2013-04-06 ENCOUNTER — Telehealth: Payer: Self-pay | Admitting: Family Medicine

## 2013-04-06 NOTE — Telephone Encounter (Signed)
[facility] After Hours Line.   Released from hospital on Friday 3/20. He had cholecystectomy with IOC and ERCP with stone extraction and sphincterotomy on 04/03/18. He has not had a bowel movement since Tuesday. No abdominal pain. Some bloating. No nausea/vomiting.   Advised Miralax BID until bowel movement. Has appointment with Dr. Lacinda Axon on Tuesday for follow up and discussed reasons for sooner return.  Brayton Mars. Melanee Spry, MD, PGY-3 04/06/2013 10:47 AM

## 2013-04-06 NOTE — Telephone Encounter (Signed)
[facility] Line  Patinet's daughter calls with a few complaints: foot swelling, face swelling, constipation.  Foot swelling: patient was discharged Friday after surg managed with gallstone extraction/sphincterotomy. He is an [facility] patient and we were consulted for diabetes management. Feet were swollen here and pt was told to elevate them. Since going home, they have continued swelling and it is noticeable on him because he is thin. They are not red or asymmetric and he has no SOB or fever.  - Suggested raising feet and coming to ED if developed SOB, asymmetry, or erythema. - Continue compression stockings. - Otherwise, has outpatient f/u tomorrow at [facility].  Face swelling: Face is not terribly swollen but again, noticeable since he is thin. No erythema, angioedema/mucus membrane involvement, or difficulty breathing. No fevers. - Suggested holding abx as could be a mild allergic reaction.  - Also suggested follow-up at ED or Urgent Care today for further evaluation if very concerned or develops SOB, pain, or fever. Otherwise, could be followed up outpatient tomorrow at pt's scheduled appt.  Constipation: Patinet is taking miralax and dtr wanted to know if he could try anything else.  - Suggested increasing miralax and trying colace.  Finally, suggested coming to ED if concern or pain was intractable as dtr stated he was in "lots of pain" especially when he walks around.  Hilton Sinclair, MD

## 2013-04-07 ENCOUNTER — Encounter: Payer: Self-pay | Admitting: Family Medicine

## 2013-04-07 ENCOUNTER — Ambulatory Visit (INDEPENDENT_AMBULATORY_CARE_PROVIDER_SITE_OTHER): Payer: Self-pay | Admitting: Family Medicine

## 2013-04-07 VITALS — BP 159/87 | HR 93 | Temp 98.2°F | Ht 70.0 in | Wt 144.4 lb

## 2013-04-07 DIAGNOSIS — IMO0002 Reserved for concepts with insufficient information to code with codable children: Secondary | ICD-10-CM

## 2013-04-07 DIAGNOSIS — E1165 Type 2 diabetes mellitus with hyperglycemia: Secondary | ICD-10-CM

## 2013-04-07 DIAGNOSIS — IMO0001 Reserved for inherently not codable concepts without codable children: Secondary | ICD-10-CM

## 2013-04-07 DIAGNOSIS — R109 Unspecified abdominal pain: Secondary | ICD-10-CM

## 2013-04-07 DIAGNOSIS — E119 Type 2 diabetes mellitus without complications: Secondary | ICD-10-CM

## 2013-04-07 LAB — GLUCOSE, CAPILLARY: Glucose-Capillary: 199 mg/dL — ABNORMAL HIGH (ref 70–99)

## 2013-04-07 MED ORDER — OXYCODONE-ACETAMINOPHEN 5-325 MG PO TABS: 1.0000 | ORAL_TABLET | ORAL | Status: DC | PRN

## 2013-04-07 MED ORDER — METFORMIN HCL 500 MG PO TABS: 500.0000 mg | ORAL_TABLET | Freq: Two times a day (BID) | ORAL | Status: DC

## 2013-04-07 NOTE — Patient Instructions (Signed)
It was nice to see you today.  I have refilled your pain medications.  Use the miralax as indicated to achieve a daily soft BM.  I have switched to Lantus.  Take 10 units daily and increase by 1 unit daily until your AM blood sugar is ~100.   Follow up in 1 month.

## 2013-04-07 NOTE — Assessment & Plan Note (Addendum)
Started patient on Lantus 10 Units daily.  Patient to titrate up 1 unit daily until achievement of fasting CBG around 100. Follow up in 1 month.

## 2013-04-07 NOTE — Progress Notes (Signed)
   Subjective:    Patient ID: Brendan [name], male    DOB: [birth year], 58 y.o.   MRN: [id number]  HPI 58 year old male presents for hospital follow up. Patient hospitalized from 3/17 to 3/20.  Patient admitted for RUQ pain; US findings were consistent with cholecystitis.  His IOC was positive.  He had a cholecystectomy and ERCP with sphincterotomy and stone extraction.   1) Abdominal pain - Patient reports severe postoperative abdominal pain since discharge from the hospital. - Pain is located at his incision sites (particularly at the umbilicus) - He has been taking Percocet Q4H with improvement in pain - He also notes that he has not had a BM x 1 week. - PO intake is slowly improving - ROS: No nausea/vomiting, fever, chills.  2) DM - A1C 12.0 during admission - Patient discharged on NPH 5 units BID - He has been checking his CBG's TID AC (ranges: 250 - 269)  - He endorses polyuria and polydipsia - No vision changes, dizziness/lightheadedness, hypoglycemia   Review of Systems Per HPI    Objective:   Physical Exam Filed Vitals:   04/07/13 1057  BP: 159/87  Pulse: 93  Temp: 98.2 F (36.8 C)   Exam: General: standing up in the room; appears in mild-moderate distress secondary to pain. Abdomen: soft, tender to palpation diffusely; nondistended.  No palpable organomegaly.  Incision sites healing well with no signs of infection. Extremities: No LE edema    Assessment & Plan:  See Problem List

## 2013-04-07 NOTE — Assessment & Plan Note (Addendum)
Postoperative pain.  No current signs/symptoms of underlying abdominal infection. Discussed titration of miralax to achieve soft BM. Additional percocet given today.

## 2013-04-08 ENCOUNTER — Ambulatory Visit: Payer: Self-pay | Admitting: Family Medicine

## 2013-04-10 ENCOUNTER — Ambulatory Visit (INDEPENDENT_AMBULATORY_CARE_PROVIDER_SITE_OTHER): Payer: Self-pay | Admitting: Pharmacist

## 2013-04-10 ENCOUNTER — Encounter: Payer: Self-pay | Admitting: Pharmacist

## 2013-04-10 VITALS — BP 144/80 | HR 92 | Wt 140.2 lb

## 2013-04-10 DIAGNOSIS — IMO0001 Reserved for inherently not codable concepts without codable children: Secondary | ICD-10-CM

## 2013-04-10 DIAGNOSIS — E1165 Type 2 diabetes mellitus with hyperglycemia: Principal | ICD-10-CM

## 2013-04-10 NOTE — Progress Notes (Signed)
S:    Patient arrives in his [employer] gear and in good spirits. Presents for diabetes management. Patient reports having history of Diabetes for ~20 years. Patient has taken oral medication since his diagnosis and insulin therapy since his recent hospital discharge on 3/21. Patient was recently converted from NPH to Lantus on 3/23 to be titrated to a goal fasting BG of ~100. The only major complaint the patient is having is nausea and "heaving" upon smelling most foods causing an inability to take in much food. He states that this started after his recent surgery but has been improving slowly since then.   O:  Lab Results  Component Value Date   HGBA1C 12.0* 04/01/2013     Testing TID Home fasting CBG readings of 199-270 (patient reported) 45 minute post-prandial/random CBG readings of 220-340 (patient reported)  Diet: Inconsistent since surgery Breakfast: Grits Lunch: Not able to eat  Dinner: Rice and cabbage  A/P: Diabetes diagnosed ~20 years ago currently uncontrolled.   Denies hypoglycemic events and is able to verbalize appropriate hypoglycemia management plan.  Reports adherence with medication. Control is suboptimal due to stress including recent surgery and long standing diabetes. Continued basal insulin Lantus (insulin glargine). Patient will continue to titrate 1 unit/day if fasting CBGs > 100mg /dl until fasting CBGs reach goal or next visit. Patient will continue to test fasting BG in addition to one post-prandial check daily. He was instructed to bring in his glucose log or his glucose meter at his next appointment. Discussed patient reported nausea and told patient to call the clinic if worsens. Written patient instructions provided.  Follow up in Pharmacist Clinic Visit in 2 weeks.   Total time in face to face counseling 30 minutes.  Patient seen with Epimenio Sarin, PharmD Candidate, Terrilyn Saver, PharmD Resident, and Vivia Ewing, PhamD Resident. Marland Kitchen

## 2013-04-10 NOTE — Patient Instructions (Signed)
Thank you for coming to see [place] today!  Continue to titrate your Lantus by 1 unit every day until your morning blood sugars are close to 100.  Start testing your blood sugar two times per day. Test every morning after you wake up and one other time a day (before lunch or before dinner).   Please make an appointment to come see Dr. Valentina Lucks in 2 weeks.

## 2013-04-10 NOTE — Assessment & Plan Note (Signed)
Diabetes diagnosed ~20 years ago currently uncontrolled.   Denies hypoglycemic events and is able to verbalize appropriate hypoglycemia management plan.  Reports adherence with medication. Control is suboptimal due to stress including recent surgery and long standing diabetes. Continued basal insulin Lantus (insulin glargine). Patient will continue to titrate 1 unit/day if fasting CBGs > 100mg /dl until fasting CBGs reach goal or next visit. Patient will continue to test fasting BG in addition to one post-prandial check daily. He was instructed to bring in his glucose log or his glucose meter at his next appointment. Discussed patient reported nausea and told patient to call the clinic if worsens. Written patient instructions provided.  Follow up in Pharmacist Clinic Visit in 2 weeks.   Total time in face to face counseling 30 minutes.  Patient seen with Epimenio Sarin, PharmD Candidate, Terrilyn Saver, PharmD Resident, and Vivia Ewing, PhamD Resident.

## 2013-04-10 NOTE — Progress Notes (Signed)
Patient ID: Brendan [name], male   DOB: 1955/12/25, 58 y.o.   MRN: [id number] Reviewed: Agree with Dr. Graylin Shiver documentation and management.

## 2013-04-17 ENCOUNTER — Telehealth: Payer: Self-pay | Admitting: Family Medicine

## 2013-04-17 NOTE — Telephone Encounter (Signed)
Daughter called concerning her father. He is taking 17 units a day and his blood sugar is never under 300 even when just waking and he is not feeling well at all. She would like someone to call and discuss what else can be done. Please call Tabitha at [phone]. jw

## 2013-04-17 NOTE — Telephone Encounter (Signed)
Patient's daughter was advised to schedule an appointment to be seen by Dr Lacinda Axon.she voiced understanding.Shanie Mauzy, Lewie Loron

## 2013-04-29 ENCOUNTER — Encounter (INDEPENDENT_AMBULATORY_CARE_PROVIDER_SITE_OTHER): Payer: Self-pay

## 2013-05-01 ENCOUNTER — Encounter (INDEPENDENT_AMBULATORY_CARE_PROVIDER_SITE_OTHER): Payer: Self-pay

## 2013-05-08 ENCOUNTER — Ambulatory Visit: Payer: Self-pay

## 2013-05-19 ENCOUNTER — Ambulatory Visit (INDEPENDENT_AMBULATORY_CARE_PROVIDER_SITE_OTHER): Payer: Self-pay | Admitting: Family Medicine

## 2013-05-19 ENCOUNTER — Encounter: Payer: Self-pay | Admitting: Family Medicine

## 2013-05-19 VITALS — BP 144/81 | HR 86 | Temp 98.9°F | Ht 70.0 in | Wt 141.0 lb

## 2013-05-19 DIAGNOSIS — IMO0001 Reserved for inherently not codable concepts without codable children: Secondary | ICD-10-CM

## 2013-05-19 DIAGNOSIS — E1165 Type 2 diabetes mellitus with hyperglycemia: Secondary | ICD-10-CM

## 2013-05-19 DIAGNOSIS — M79606 Pain in leg, unspecified: Secondary | ICD-10-CM

## 2013-05-19 DIAGNOSIS — IMO0002 Reserved for concepts with insufficient information to code with codable children: Secondary | ICD-10-CM

## 2013-05-19 DIAGNOSIS — M79609 Pain in unspecified limb: Secondary | ICD-10-CM

## 2013-05-19 DIAGNOSIS — E119 Type 2 diabetes mellitus without complications: Secondary | ICD-10-CM

## 2013-05-19 MED ORDER — GABAPENTIN 300 MG PO CAPS: 300.0000 mg | ORAL_CAPSULE | Freq: Three times a day (TID) | ORAL | Status: DC

## 2013-05-19 MED ORDER — INSULIN ASPART 100 UNIT/ML ~~LOC~~ SOLN: SUBCUTANEOUS | Status: DC

## 2013-05-19 MED ORDER — INSULIN GLARGINE 100 UNIT/ML ~~LOC~~ SOLN: 32.0000 [IU] | Freq: Every day | SUBCUTANEOUS | Status: DC

## 2013-05-19 NOTE — Patient Instructions (Signed)
It was nice to see you today.  Continue taking your Lantus (32 units daily).  Start Novolog; 10 units with the largest meal of the day.  Start taking Gabapentin for your leg/foot pain.  Contact the Medication Assistance Program (MAP) - [phone] - option 2.  They will guide you/help you get your medication at little to no cost.

## 2013-05-19 NOTE — Progress Notes (Signed)
   Subjective:    Patient ID: ERDI BEVIL, male    DOB: [birth year], 58 y.o.   MRN: [id number]  HPI 58 year old male with hx of elevated BP and DM-2 presents for follow up regarding DM-2.  1) Diabetes mellitus, type 2  Disease Monitoring  Blood Sugar Ranges: Fasting 110's - 140's; PM (before dinner) - ~ 200's  Polyuria: no   Visual problems: no   Medication Compliance: yes; Lantus now titrated up to 32 units daily Medication Side Effects  Hypoglycemia: no   Preventitive Health Care  Eye Exam: Patient in need of eye exam.  Foot Exam: Performed today (see below)  2) Leg/foot pain - Patient reports that for the past 3 weeks he has been experiencing pain in his LE's particularly his feet. - Pain is described as achy and as if "I'm stepping on needles."  - Pain is worse at night and keeps him from sleeping well. - He also endorses some pain in his proximal right leg, near his groin.  No known injury, fall, trauma.  No reported knee pain or back pain.  He does endorse some intermittent hip pain.   Review of Systems Per HPI    Objective:   Physical Exam Filed Vitals:   05/19/13 1103  BP: 144/81  Pulse: 86  Temp: 98.9 F (37.2 C)   Exam: General: well appearing gentleman in NAD.  Extremities: No LE edema.  MSK:   Right hip - normal ROM.  No tenderness of the greater trochanter.    Right knee - ligaments intact. No tenderness to palpation.  Good ROM.    Diabetic Foot Check -  Appearance - no lesions, ulcers or calluses Skin - no unusual pallor or redness Monofilament testing -  Right - Great toe, medial, central, lateral ball and posterior foot intact Left - Great toe, medial, central, lateral ball and posterior foot intact  Skin: Warm, dry, intact.    Assessment & Plan:  See Problem List

## 2013-05-19 NOTE — Assessment & Plan Note (Signed)
CBG's improved but elevated at night. Will start Novolog with the largest meal of the day.  Patient to continue Lantus 32 units daily and metformin. Follow up in 1-2 months.  Patient given info regarding MAP.

## 2013-05-19 NOTE — Assessment & Plan Note (Signed)
Given history, this is likely secondary to neuropathic pain from uncontrolled DM. Will start gabapentin and titrate to improvement.

## 2013-06-17 ENCOUNTER — Other Ambulatory Visit (INDEPENDENT_AMBULATORY_CARE_PROVIDER_SITE_OTHER): Payer: Self-pay | Admitting: General Surgery

## 2013-07-08 ENCOUNTER — Telehealth: Payer: Self-pay | Admitting: Family Medicine

## 2013-07-09 NOTE — Telephone Encounter (Signed)
Called Pt about DM care. When Pt calls back please schedule an appointment to check LDL, and A1C.

## 2013-07-28 ENCOUNTER — Telehealth (INDEPENDENT_AMBULATORY_CARE_PROVIDER_SITE_OTHER): Payer: Self-pay | Admitting: General Surgery

## 2013-07-28 ENCOUNTER — Telehealth: Payer: Self-pay | Admitting: Family Medicine

## 2013-07-28 DIAGNOSIS — E119 Type 2 diabetes mellitus without complications: Secondary | ICD-10-CM

## 2013-07-28 MED ORDER — INSULIN GLARGINE 100 UNIT/ML ~~LOC~~ SOLN: 32.0000 [IU] | Freq: Every day | SUBCUTANEOUS | Status: DC

## 2013-07-28 MED ORDER — "INSULIN SYRINGE 28G X 1/2"" 0.5 ML MISC": 1.0000 | Freq: Two times a day (BID) | Status: DC

## 2013-07-28 MED ORDER — GLUCOSE BLOOD VI STRP: ORAL_STRIP | Status: DC

## 2013-07-28 MED ORDER — RELION LANCETS ULTRA-THIN 30G MISC: Status: AC

## 2013-07-28 NOTE — Telephone Encounter (Signed)
Refill request for Lantus and th syringes. Please call [phone] once completed.

## 2013-07-28 NOTE — Telephone Encounter (Signed)
Rx sent 

## 2013-08-01 NOTE — Telephone Encounter (Signed)
Patient needs to be seen on Monday, we can give him free lantus at that time or we can switch to 70/30.  I cannot just send in a different insulin without knowing his blood sugars and explaining the regimen.

## 2013-08-01 NOTE — Telephone Encounter (Signed)
Mr. Standing daughter is asking that something less expensive be called to pharmacy for insuling.  Cannot afford current rx on file.  Please call daughter Lawerance Bach at [phone] when sent.

## 2013-08-19 ENCOUNTER — Encounter: Payer: Self-pay | Admitting: Family Medicine

## 2013-08-19 NOTE — Telephone Encounter (Signed)
Dr Lacinda Axon please see previous message,patient needs a less expensive medication.Brendan [name], Lewie Loron

## 2013-08-19 NOTE — Telephone Encounter (Signed)
Patient needs to be seen for this. We can give him free lantus or switch him to 70/30.

## 2013-08-19 NOTE — Progress Notes (Unsigned)
Pt's daughter was here for an office visit and stated that Mr.Batra insulin medicine that was sent to pharmacy cost wise is to high can another medicine be sent instead, she can be reached at [phone].

## 2014-03-31 ENCOUNTER — Telehealth: Payer: Self-pay | Admitting: *Deleted

## 2014-03-31 NOTE — Telephone Encounter (Signed)
Tried calling patient to schedule follow up of DM, number no longer in service.

## 2015-01-20 ENCOUNTER — Ambulatory Visit: Payer: Self-pay | Admitting: Family Medicine

## 2015-07-15 ENCOUNTER — Ambulatory Visit: Payer: Self-pay | Admitting: Family Medicine

## 2017-05-29 ENCOUNTER — Other Ambulatory Visit: Payer: Self-pay

## 2017-05-29 ENCOUNTER — Ambulatory Visit (INDEPENDENT_AMBULATORY_CARE_PROVIDER_SITE_OTHER): Payer: Self-pay | Admitting: Family Medicine

## 2017-05-29 ENCOUNTER — Encounter: Payer: Self-pay | Admitting: Family Medicine

## 2017-05-29 VITALS — BP 114/68 | HR 98 | Temp 98.1°F | Ht 70.0 in | Wt 133.0 lb

## 2017-05-29 DIAGNOSIS — E1165 Type 2 diabetes mellitus with hyperglycemia: Secondary | ICD-10-CM

## 2017-05-29 DIAGNOSIS — R296 Repeated falls: Secondary | ICD-10-CM | POA: Insufficient documentation

## 2017-05-29 DIAGNOSIS — F4321 Adjustment disorder with depressed mood: Secondary | ICD-10-CM | POA: Insufficient documentation

## 2017-05-29 DIAGNOSIS — R634 Abnormal weight loss: Secondary | ICD-10-CM

## 2017-05-29 DIAGNOSIS — G629 Polyneuropathy, unspecified: Secondary | ICD-10-CM | POA: Insufficient documentation

## 2017-05-29 LAB — POCT GLYCOSYLATED HEMOGLOBIN (HGB A1C): Hemoglobin A1C: 12.4

## 2017-05-29 MED ORDER — GABAPENTIN 300 MG PO CAPS: 300.0000 mg | ORAL_CAPSULE | Freq: Three times a day (TID) | ORAL | 3 refills | Status: DC

## 2017-05-29 NOTE — Assessment & Plan Note (Addendum)
A1c today is 12.4.  Patient's pain and numbness are likely due to his uncontrolled diabetes.  Weight loss and polyuria could also be caused by his diabetes, although other reasons are possible, such as malignancy and depression for weight loss and BPH for urinary frequency.  Our first step is to control patient's diabetes.  Will refer to Dr. Valentina Lucks for diabetes education and initiation of more affordable forms of insulin.  Patient is working on getting disability, so he may have other options available to him in the future.

## 2017-05-29 NOTE — Patient Instructions (Addendum)
It was nice meeting you today Brendan [name]!  Your A1c today is 12.4, which means your diabetes is uncontrolled.  I am prescribing gabapentin for you today to help with your nerve pain and to help you sleep.  Please use the coupon to get it at a lower cost.  Please also continue to take metformin and check your blood sugar daily.  You have an appointment with Dr. Valentina Lucks to discuss your diabetes next Thursday at 1:30 at our clinic.  Please make an appointment with integrated health when you check out today.  I would like to see you back in two weeks to continue talking about your symptoms.  If you have any questions or concerns, please feel free to call the clinic.   Be well,  Dr. Shan Levans

## 2017-05-29 NOTE — Assessment & Plan Note (Signed)
Discussed role of medications and therapy, including  integrated health at [facility], and patient is amenable to trying these.  Will see patient in two weeks to discuss his mood and the options he wishes to pursue.

## 2017-05-29 NOTE — Progress Notes (Signed)
Subjective:    Brendan [name] - 62 y.o. male MRN [id number]  Date of birth: 02/24/1955  HPI  Brendan [name] is here to reestablish care.  He has been out of his job for one year so he has not been taking any medications other than metformin twice per day.  He has pain and numbness in his bilateral feet with numbness in his hands as well.  Every time he has checked his sugars, they have been high, with values in the 200s and 300s.  He also notes unintentional weight loss that he attributes to poor appetite.  He urinates frequently, getting up 5-6 times per night.  He has also been falling frequently due to his sensory loss in his feet and legs.  He also says that he has lost his wife, his job, and his home in the last year.  He has been under an extreme amount of stress due to these changes and thinks that this could also be contributing to his poor appetite and poor sleep.     Health Maintenance:  Health Maintenance Due  Topic Date Due  . Hepatitis C Screening  January 04, 1956  . PNEUMOCOCCAL POLYSACCHARIDE VACCINE (1) 10/14/1959  . FOOT EXAM  10/14/1967  . OPHTHALMOLOGY EXAM  10/14/1967  . URINE MICROALBUMIN  10/14/1967  . HIV Screening  10/13/1972  . COLONOSCOPY  10/14/2007  . TETANUS/TDAP  05/16/2013  . HEMOGLOBIN A1C  10/02/2013    -  reports that he has never smoked. His smokeless tobacco use includes snuff. - Review of Systems: Per HPI. - Past Medical History: Patient Active Problem List   Diagnosis Date Noted  . Frequent falls 05/29/2017  . Adjustment disorder with depressed mood 05/29/2017  . Peripheral neuropathy 05/29/2017  . Lower extremity pain 05/19/2013  . Preventative health care 03/11/2012  . Elevated blood pressure 03/11/2012  . Type II diabetes mellitus, uncontrolled ([place]) 03/15/2006   - Medications: reviewed and updated   Objective:   Physical Exam BP 114/68   Pulse 98   Temp 98.1 F (36.7 C) (Oral)   Ht 5\' 10"  (1.778 m)   Wt 133 lb (60.3 kg)    SpO2 99%   BMI 19.08 kg/m  Gen: NAD, alert, cooperative with exam, very thin with bitemporal wasting HEENT: NCAT, clear conjunctiva, supple neck, scabs on forehead CV: RRR, good S1/S2, no murmur, no edema  Resp: CTABL, no wheezes, non-labored Abd: SNTND, BS present, no guarding or organomegaly Skin: no rashes, normal turgor  Neuro: no gross deficits.  Psych: good insight, alert and oriented, sad mood  Diabetic Foot Exam - Simple   Simple Foot Form Visual Inspection No deformities, no ulcerations, no other skin breakdown bilaterally:  Yes Sensation Testing See comments:  Yes Pulse Check Posterior Tibialis and Dorsalis pulse intact bilaterally:  Yes Comments Sensation to monofilament testing absent 3/4 distance to his knees bilaterally. Hands also with diminished sensation to monofilament testing, especially in 4th and 5th digits.           Assessment & Plan:   Type II diabetes mellitus, uncontrolled A1c today is 12.4.  Patient's pain and numbness are likely due to his uncontrolled diabetes.  Weight loss and polyuria could also be caused by his diabetes, although other reasons are possible, such as malignancy and depression for weight loss and BPH for urinary frequency.  Our first step is to control patient's diabetes.  Will refer to Dr. Valentina Lucks for diabetes education and initiation of more affordable forms of  insulin.  Patient is working on getting disability, so he may have other options available to him in the future.  Adjustment disorder with depressed mood Discussed role of medications and therapy, including  integrated health at [facility], and patient is amenable to trying these.  Will see patient in two weeks to discuss his mood and the options he wishes to pursue.    Maia Breslow, M.D. 05/29/2017, 7:52 PM PGY-1, [place]

## 2017-06-07 ENCOUNTER — Encounter: Payer: Self-pay | Admitting: Pharmacist

## 2017-06-07 ENCOUNTER — Ambulatory Visit (INDEPENDENT_AMBULATORY_CARE_PROVIDER_SITE_OTHER): Payer: Self-pay | Admitting: Pharmacist

## 2017-06-07 DIAGNOSIS — IMO0002 Reserved for concepts with insufficient information to code with codable children: Secondary | ICD-10-CM

## 2017-06-07 DIAGNOSIS — E118 Type 2 diabetes mellitus with unspecified complications: Secondary | ICD-10-CM

## 2017-06-07 DIAGNOSIS — E119 Type 2 diabetes mellitus without complications: Secondary | ICD-10-CM

## 2017-06-07 DIAGNOSIS — E1165 Type 2 diabetes mellitus with hyperglycemia: Secondary | ICD-10-CM

## 2017-06-07 MED ORDER — METFORMIN HCL 500 MG PO TABS: 500.0000 mg | ORAL_TABLET | Freq: Two times a day (BID) | ORAL | 3 refills | Status: DC

## 2017-06-07 MED ORDER — INSULIN DEGLUDEC 100 UNIT/ML ~~LOC~~ SOPN: 10.0000 [IU] | PEN_INJECTOR | Freq: Every day | SUBCUTANEOUS | 0 refills | Status: DC

## 2017-06-07 MED ORDER — INSULIN ASPART 100 UNIT/ML ~~LOC~~ SOLN: 6.0000 [IU] | Freq: Two times a day (BID) | SUBCUTANEOUS | 0 refills | Status: DC

## 2017-06-07 NOTE — Assessment & Plan Note (Signed)
Diabetes longstanding since 1999 currently uncontrolled. Patient is able to verbalize appropriate hypoglycemia management plan. Patient is not adherent with medication. Control is suboptimal due to high stress levels, high-carb diet, and lack of medication supply due to cost.. -Started basal insulin Tresiba (insulin degludec) 10 units daily. Patient will continue to titrate 1 unit every day until 30 units if fasting CBGs > 100mg /dl until fasting CBGs reach goal or next visit.   -Started rapid insulin Novolog (insulin aspart) 6 units twice daily with every large meal. Patient will look into disability eligibility and VA application to establish care and be able to receive medications in the future. -Continued metformin 500mg  twice daily. Sent new prescription and explained that it should cost no more than $10. Patient understood and will call if the pharmacy tells him otherwise. -Reviewed on s/sx of and management of hypoglycemia -Next A1C anticipated 08/2017.

## 2017-06-07 NOTE — Patient Instructions (Addendum)
It was nice meeting you today!  1. START Tresiba (long-acting insulin) 10 units today.  Increase by 1 unit every day until you morning blood sugar is less than 100.   2. START Novolog (mealtime insulin) 6 units before every large meal twice daily.  3. Continue metformin 500mg . Take 1 tablet by mouth twice daily.  4. Continue to check your blood sugars every morning.  Bring your blood sugar meter and written record when you see [place] next time.  Please follow-up with Dr. Shan Levans next week.  Follow-up with [place] in the pharmacy clinic in 3 weeks.

## 2017-06-07 NOTE — Progress Notes (Signed)
S:     Chief Complaint  Patient presents with  . Medication Management    Diabetes    Patient arrives well and ambulating without assistance.  Presents for diabetes evaluation, education, and management at the request of Dr. Shan Levans. Patient was referred on 05/29/2017.  Patient was last seen by Primary Care Provider on 05/29/2017.   Today, he presents with significant nerve pain and states that he no longer drives because he feels unsafe doing so. He reports picking up his first gabapentin prescription (300mg  TID) yesterday and endorses taking 2 doses so far.  Patient reports not taking any of his diabetes medications since he cannot afford them. He has been without his Lantus for a few months and ran out of metformin within the past few weeks. Previously, he had been taking metformin 500mg  BID and Lantus 30 units daily. He would also take an additional 20 units of Lantus in the afternoon if his BG was high. When he ran out of Lantus, he reports taking metformin 500mg  TID instead of BID to make up for his lack of insulin. His daughter recently bought him a CBG meter and reports that his FBG have been ~200s-300s. He did not bring his meter today and cannot recall the name of his meter.  Today, he states that he would like to be on a medication that could help him gain weight.  Patient does not currently have any insurance. He lost his job around three years ago and currently has no income. He has been living with family members and endorses being stressed as he is living with family members and feels like a burden. Patient is interested in applying for disability and the [place], as he was a veteran for 4 years. After discussion, patient agreed to look further into applying for these programs. He currently lives in [place] and does not qualify for the orange card.  Patient reports Diabetes was diagnosed in 1999.   Family/Social History: strong maternal history of diabetes, daughter has  diabetes, brother has low BG. Denies family history of stroke and MI.   Patient denies adherence with medications since he has been without his medications for weeks. Current diabetes medications include: none Current hypertension medications include: none  Patient reports hypoglycemic events 6-8 months ago when his BG was 56 when taking insulin (long-acting only and metformin).   Patient reported dietary habits: Eats 2 meals/day Breakfast:none Lunch:hamburger & small fries Dinner:spaghetti with meatballs Snacks:none Drinks:water, diet coke  Patient-reported exercise habits: walking 1-2 miles   Patient reports nocturia.  Patient reports neuropathy for which he is now taking gabapentin. Patient reports visual changes. Patient reports self foot exams. Had a recent cut but reports that it has healed.   O:  Physical Exam  Constitutional: He appears well-developed and well-nourished.     Review of Systems  Eyes: Positive for blurred vision.  Neurological: Positive for sensory change.  All other systems reviewed and are negative. Numbness reported in all extremities.    Lab Results  Component Value Date   HGBA1C 12.4 05/29/2017   Vitals:   06/07/17 1342  BP: 140/90  Pulse: 98  SpO2: 98%    Lipid Panel     Component Value Date/Time   CHOL 194 03/11/2008 2048   TRIG 148 03/11/2008 2048   HDL 41 03/11/2008 2048   CHOLHDL 4.7 Ratio 03/11/2008 2048   VLDL 30 03/11/2008 2048   LDLCALC 123 (H) 03/11/2008 2048    Home fasting CBG:  200s-300s  Clinical ASCVD: No  ASCVD risk factors : age 69-75  ASCVD Risk: 19.1% (lipids from 2010)   A/P: Diabetes longstanding since 1999 currently uncontrolled. Patient is able to verbalize appropriate hypoglycemia management plan. Patient is not adherent with medication. Control is suboptimal due to high stress levels, high-carb diet, and lack of medication supply due to cost.. -Started basal insulin Tresiba (insulin degludec) 10  units daily. Patient will continue to titrate 1 unit every day until 30 units if fasting CBGs > 100mg /dl until fasting CBGs reach goal or next visit.   -Started rapid insulin Novolog (insulin aspart) 6 units twice daily with every large meal. Patient will look into disability eligibility and VA application to establish care and be able to receive medications in the future. -Continued metformin 500mg  twice daily. Sent new prescription and explained that it should cost no more than $10. Patient understood and will call if the pharmacy tells him otherwise. -Reviewed on s/sx of and management of hypoglycemia -Next A1C anticipated 08/2017.   ASCVD risk - primary prevention in patient with DM. Last LDL is not controlled. ASCVD risk score is not >20%, but very close at 19.1%  - moderate intensity statin indicated. Aspirin is indicated.  -Obtain lipid panel at next visit (most recent lipids from 2010). -Consider addition of moderate or high intensity statin at next visit.  Hypertension undiagnosed but currently uncontrolled.  BP goal = 130/80 mmHg. Control is suboptimal due to lack of therapy regimen and high levels of stress with hyperglycemia.  -Consider addition of ACE/ARB at next clinic visit if BP remains uncontrolled.  Written patient instructions provided.  Total time in face to face counseling 45 minutes.   Follow up PCP Clinic Visit next week and with pharmacy clinic in 3 weeks.   Patient seen with Richardine Service, PharmD Candidate and Deirdre Pippins, PharmD, BCPS, PGY2 Pharmacy Resident.

## 2017-06-25 ENCOUNTER — Ambulatory Visit: Payer: Self-pay | Admitting: Family Medicine

## 2017-07-18 ENCOUNTER — Telehealth: Payer: Self-pay | Admitting: *Deleted

## 2017-07-18 DIAGNOSIS — E1165 Type 2 diabetes mellitus with hyperglycemia: Secondary | ICD-10-CM

## 2017-07-18 MED ORDER — INSULIN DEGLUDEC 100 UNIT/ML ~~LOC~~ SOPN: 10.0000 [IU] | PEN_INJECTOR | Freq: Every day | SUBCUTANEOUS | 0 refills | Status: DC

## 2017-07-18 NOTE — Telephone Encounter (Signed)
Pt calls and needs samples until his appt in august.  # pens of [place] given. Fleeger, Salome Spotted, CMA

## 2017-08-21 ENCOUNTER — Other Ambulatory Visit: Payer: Self-pay

## 2017-08-21 ENCOUNTER — Encounter: Payer: Self-pay | Admitting: Family Medicine

## 2017-08-21 ENCOUNTER — Ambulatory Visit (INDEPENDENT_AMBULATORY_CARE_PROVIDER_SITE_OTHER): Payer: Self-pay | Admitting: Family Medicine

## 2017-08-21 VITALS — BP 116/70 | HR 64 | Temp 98.4°F | Ht 70.0 in | Wt 135.0 lb

## 2017-08-21 DIAGNOSIS — E1165 Type 2 diabetes mellitus with hyperglycemia: Secondary | ICD-10-CM

## 2017-08-21 DIAGNOSIS — Z0000 Encounter for general adult medical examination without abnormal findings: Secondary | ICD-10-CM

## 2017-08-21 DIAGNOSIS — E118 Type 2 diabetes mellitus with unspecified complications: Secondary | ICD-10-CM

## 2017-08-21 DIAGNOSIS — IMO0002 Reserved for concepts with insufficient information to code with codable children: Secondary | ICD-10-CM

## 2017-08-21 DIAGNOSIS — G629 Polyneuropathy, unspecified: Secondary | ICD-10-CM

## 2017-08-21 LAB — POCT GLYCOSYLATED HEMOGLOBIN (HGB A1C): HbA1c, POC (controlled diabetic range): 8.5 % — AB (ref 0.0–7.0)

## 2017-08-21 LAB — POCT UA - MICROALBUMIN
Creatinine, POC: 100 mg/dL
MICROALBUMIN (UR) POC: 30 mg/L

## 2017-08-21 MED ORDER — GABAPENTIN 300 MG PO CAPS: 300.0000 mg | ORAL_CAPSULE | Freq: Three times a day (TID) | ORAL | 3 refills | Status: DC

## 2017-08-21 MED ORDER — ROSUVASTATIN CALCIUM 20 MG PO TABS: 20.0000 mg | ORAL_TABLET | Freq: Every day | ORAL | 11 refills | Status: DC

## 2017-08-21 NOTE — Progress Notes (Signed)
Subjective:    Brendan [name] - 62 y.o. male MRN [id number]  Date of birth: May 26, 1955  HPI  Brendan [name] is here for diabetes follow-up to receive a physical.  Diabetes mellitus - Patient has not had access to his [place] because he has not had a car for the last month - Continues to take metformin 500 mg twice daily and NovoLog 2 to 3 units after a big meal - He has found that NovoLog 6-8 units is too much for him, and he has had hypoglycemic episodes after this dose - His battery on his glucometer has died, so he has not been able to check his blood sugars - He has been eating many vegetables that he is going to his garden and very few processed foods -He has been physically active with gardening and walking every day because he not able to work due to his peripheral neuropathy line-his brother has given him a car, so he now has his own transportation  - He had an active job previously where he needed to climb on ladders, and he said that he frequently fell and did not feel safe, so he is currently applying for disability  Health Maintenance:  - patient is amenable to Hep C and HIV screening, urine microalbumin, and a foot exam today.  To his lack of insurance, he cannot afford a pneumonia vaccine or tetanus vaccine, eye exam, or colonoscopy.    Health Maintenance Due  Topic Date Due  . PNEUMOCOCCAL POLYSACCHARIDE VACCINE (1) 10/14/1959  . OPHTHALMOLOGY EXAM  10/14/1967  . COLONOSCOPY  10/14/2007  . TETANUS/TDAP  05/16/2013  . INFLUENZA VACCINE  08/16/2017    -  reports that he has never smoked. His smokeless tobacco use includes snuff and chew. - Review of Systems: Per HPI. - Past Medical History: Patient Active Problem List   Diagnosis Date Noted  . Uncontrolled type 2 diabetes mellitus with hyperglycemia ([place]) 08/22/2017  . Frequent falls 05/29/2017  . Adjustment disorder with depressed mood 05/29/2017  . Peripheral neuropathy 05/29/2017  . Lower extremity pain  05/19/2013  . Healthcare maintenance 03/11/2012  . Elevated blood pressure 03/11/2012  . Type II diabetes mellitus with complication, uncontrolled ([place]) 03/15/2006   - Medications: reviewed and updated   Objective:   Physical Exam BP 116/70   Pulse 64   Temp 98.4 F (36.9 C) (Oral)   Ht 5\' 10"  (1.778 m)   Wt 135 lb (61.2 kg)   SpO2 98%   BMI 19.37 kg/m  Gen: NAD, alert, cooperative with exam, well-appearing HEENT: NCAT, PERRL, clear conjunctiva, oropharynx clear, upper and lower dentures  CV: RRR, good S1/S2, no murmur, no edema Resp: CTABL, no wheezes, non-labored Abd: SNTND, BS present, no guarding or organomegaly Skin: no rashes, normal turgor, some bruising and abrasions on legs and hands Neuro: no gross deficits.  Psych: good insight, alert and oriented  Diabetic Foot Exam - Simple   Simple Foot Form Diabetic Foot exam was performed with the following findings:  Yes 08/22/2017 10:25 AM  Visual Inspection No deformities, no ulcerations, no other skin breakdown bilaterally:  Yes Sensation Testing See comments:  Yes Pulse Check Posterior Tibialis and Dorsalis pulse intact bilaterally:  Yes Comments Patient's feet insensate to monofilament and touch from feet to knees        Assessment & Plan:   Type II diabetes mellitus with complication, uncontrolled (HCC) A1c today was 8.5, vastly improved from his previous measurement of 12.4 in May.  He was congratulated on his progress.  He was able to obtain his [place] samples today.  Was encouraged to continue Tresiba, NovoLog 2 to 3 units with large meals, and metformin as prescribed.  Patient was congratulated on his healthy diet and high level of physical activity.  Patient was amenable to starting a high intensity statin given his ASCVD risk factors.  I prescribed Crestor 20 mg today.  We will also obtained a urine microalbumin today.  I would like patient to return in 3 months.  Peripheral neuropathy Neuropathy includes  his bilateral feet up to his bilateral knees as well as his hands.  We discussed proper foot care, and patient is already performing nightly checks and wearing shoes in and out of the house.  Healthcare maintenance Will obtain HIV and hep C screening today and will also obtain lipid panel.    Maia Breslow, M.D. 08/22/2017, 10:43 AM PGY-2, [place]

## 2017-08-21 NOTE — Patient Instructions (Signed)
It was nice seeing you today Brendan [name]!  Your diabetes is much improved, and your A1c today is 8.5.  Continue eating healthily and being active, you're doing a great job.  We are getting labs to check your cholesterol and kidney function today.  We are also screening for hepatitis C and HIV.  I will let you know if any of these labs are abnormal.  Please return in 3 months or before if needed.  Ask about the orange card at the front office to help you afford more medications.  If you have any questions or concerns, please feel free to call the clinic.   Be well,  Dr. Shan Levans

## 2017-08-22 DIAGNOSIS — E1165 Type 2 diabetes mellitus with hyperglycemia: Secondary | ICD-10-CM | POA: Insufficient documentation

## 2017-08-22 LAB — LIPID PANEL
Chol/HDL Ratio: 3.1 ratio (ref 0.0–5.0)
Cholesterol, Total: 174 mg/dL (ref 100–199)
HDL: 57 mg/dL (ref >39)
LDL Calculated: 88 mg/dL (ref 0–99)
Triglycerides: 143 mg/dL (ref 0–149)
VLDL CHOLESTEROL CAL: 29 mg/dL (ref 5–40)

## 2017-08-22 LAB — HIV ANTIBODY (ROUTINE TESTING W REFLEX): HIV Screen 4th Generation wRfx: NONREACTIVE

## 2017-08-22 LAB — HEPATITIS C ANTIBODY: Hep C Virus Ab: <0.1 s/co ratio (ref 0.0–0.9)

## 2017-08-22 NOTE — Assessment & Plan Note (Addendum)
Will obtain HIV and hep C screening today and will also obtain lipid panel.

## 2017-08-22 NOTE — Assessment & Plan Note (Signed)
A1c today was 8.5, vastly improved from his previous measurement of 12.4 in May.  He was congratulated on his progress.  He was able to obtain his [place] samples today.  Was encouraged to continue Tresiba, NovoLog 2 to 3 units with large meals, and metformin as prescribed.  Patient was congratulated on his healthy diet and high level of physical activity.  Patient was amenable to starting a high intensity statin given his ASCVD risk factors.  I prescribed Crestor 20 mg today.  We will also obtained a urine microalbumin today.  I would like patient to return in 3 months.

## 2017-08-22 NOTE — Assessment & Plan Note (Signed)
Neuropathy includes his bilateral feet up to his bilateral knees as well as his hands.  We discussed proper foot care, and patient is already performing nightly checks and wearing shoes in and out of the house.

## 2017-10-26 ENCOUNTER — Telehealth: Payer: Self-pay

## 2017-10-26 DIAGNOSIS — E1165 Type 2 diabetes mellitus with hyperglycemia: Secondary | ICD-10-CM

## 2017-10-26 MED ORDER — INSULIN ASPART 100 UNIT/ML ~~LOC~~ SOLN: 6.0000 [IU] | Freq: Two times a day (BID) | SUBCUTANEOUS | 0 refills | Status: DC

## 2017-10-26 MED ORDER — INSULIN DEGLUDEC 100 UNIT/ML ~~LOC~~ SOPN: 10.0000 [IU] | PEN_INJECTOR | Freq: Every day | SUBCUTANEOUS | 0 refills | Status: DC

## 2017-10-26 NOTE — Telephone Encounter (Signed)
Patient wife called and patient needs samples of Novolog and [place]. Two vials of Novolog and 2 pens of Tresiba in med room refrigerator for patient.  Danley Danker, RN [facility] RN)

## 2018-01-11 ENCOUNTER — Telehealth: Payer: Self-pay

## 2018-01-11 DIAGNOSIS — E1165 Type 2 diabetes mellitus with hyperglycemia: Secondary | ICD-10-CM

## 2018-01-11 MED ORDER — INSULIN DEGLUDEC 100 UNIT/ML ~~LOC~~ SOPN: 10.0000 [IU] | PEN_INJECTOR | Freq: Every day | SUBCUTANEOUS | 0 refills | Status: DC

## 2018-01-11 MED ORDER — INSULIN ASPART 100 UNIT/ML FLEXPEN: PEN_INJECTOR | SUBCUTANEOUS | 0 refills | Status: DC

## 2018-01-11 NOTE — Telephone Encounter (Signed)
Patient daughter here asking for samples of father's insulins. He does not have insurance. Gave 4 pens of [place] and 4 pens of Novolog.  Danley Danker, RN [facility] RN)

## 2018-04-04 ENCOUNTER — Telehealth: Payer: Self-pay

## 2018-04-04 DIAGNOSIS — E1165 Type 2 diabetes mellitus with hyperglycemia: Secondary | ICD-10-CM

## 2018-04-04 MED ORDER — INSULIN ASPART (W/NIACINAMIDE) 100 UNIT/ML ~~LOC~~ SOPN: 6.0000 [IU] | PEN_INJECTOR | Freq: Two times a day (BID) | SUBCUTANEOUS | 0 refills | Status: DC

## 2018-04-04 MED ORDER — INSULIN DEGLUDEC 100 UNIT/ML ~~LOC~~ SOPN: 10.0000 [IU] | PEN_INJECTOR | Freq: Every day | SUBCUTANEOUS | 0 refills | Status: DC

## 2018-04-04 NOTE — Telephone Encounter (Signed)
Patient wife called. He needs samples of Tresiba flexpen and Novolog flexpen.  No novolog available. Conferred with Dr. Valentina Lucks and given samples of Fiasp instead of Novolog.  Spoke with wife and explained substitution. She will come pick up samples.  Danley Danker, RN [facility] RN)

## 2018-07-26 ENCOUNTER — Telehealth: Payer: Self-pay

## 2018-07-26 NOTE — Telephone Encounter (Signed)
Patient may receive 2 pens of Novolog and 2 pens of [place].  He is due for a follow up appointment with me as well if we can have him schedule that.  Please let me know if you need further orders from me for these samples.

## 2018-07-26 NOTE — Telephone Encounter (Signed)
Patients wife called stating she will be in [place] on Monday for an apt, and she is wondering if she can pick up samples of Novolog and [place] for patient?  We do have available in the refrigerator. Please advise.

## 2018-07-29 ENCOUNTER — Other Ambulatory Visit: Payer: Self-pay

## 2018-07-29 MED ORDER — NOVOLOG FLEXPEN 100 UNIT/ML ~~LOC~~ SOPN: PEN_INJECTOR | SUBCUTANEOUS | 0 refills | Status: DC

## 2018-07-29 NOTE — Telephone Encounter (Signed)
Put in medicine refrigerator. Ottis Stain, CMA

## 2018-07-29 NOTE — Telephone Encounter (Signed)
Pt notified, wife will be here today to pick up. Ottis Stain, CMA

## 2018-08-06 ENCOUNTER — Ambulatory Visit: Payer: Self-pay | Admitting: Family Medicine

## 2018-08-13 ENCOUNTER — Ambulatory Visit: Payer: Self-pay | Admitting: Family Medicine

## 2018-10-03 ENCOUNTER — Telehealth: Payer: Self-pay | Admitting: *Deleted

## 2018-10-03 DIAGNOSIS — E1165 Type 2 diabetes mellitus with hyperglycemia: Secondary | ICD-10-CM

## 2018-10-03 MED ORDER — TRESIBA FLEXTOUCH 100 UNIT/ML ~~LOC~~ SOPN: 10.0000 [IU] | PEN_INJECTOR | Freq: Every day | SUBCUTANEOUS | 0 refills | Status: DC

## 2018-10-03 NOTE — Telephone Encounter (Signed)
Pt calling for samples of Tresiba.  Advised we could provide one pen as he has not been seen in one year.  Appt made for 2 weeks out.  Pt understands that we cant give more samples without an appt. Christen Bame, CMA

## 2018-10-21 ENCOUNTER — Ambulatory Visit: Payer: Self-pay | Admitting: Family Medicine

## 2018-10-31 ENCOUNTER — Ambulatory Visit: Payer: Self-pay | Admitting: Family Medicine

## 2019-01-06 ENCOUNTER — Telehealth: Payer: Self-pay

## 2019-01-06 NOTE — Telephone Encounter (Signed)
Patients wife calls nurse line asking for Brendan [name] samples to get him to 12/29 apt. Per chart review, patient has not been seen in over 1 year and per Jessica's last note, "no more samples until patient is seen." Patient has no showed the last couple of apts. I informed wife of this and stated I would reach out to PCP to see if we can provide. Please advise.

## 2019-01-06 NOTE — Telephone Encounter (Signed)
We can provide enough Brendan [name] to get him to 12/29, but this will be the last sample he will receive unless he comes to that appointment.  Thanks.

## 2019-01-07 NOTE — Telephone Encounter (Signed)
2 Tresiba pens left in med room fridge for patient. Spoke with wife and informed her of this and informed her we will not give him any more samples until he is seen. Wife agreed and grateful.   Lot: [id number]  Ex: 02/16/2020

## 2019-01-14 ENCOUNTER — Ambulatory Visit (INDEPENDENT_AMBULATORY_CARE_PROVIDER_SITE_OTHER): Payer: Self-pay | Admitting: Family Medicine

## 2019-01-14 ENCOUNTER — Other Ambulatory Visit: Payer: Self-pay

## 2019-01-14 ENCOUNTER — Encounter: Payer: Self-pay | Admitting: Family Medicine

## 2019-01-14 VITALS — BP 128/74 | HR 92 | Wt 134.8 lb

## 2019-01-14 DIAGNOSIS — IMO0002 Reserved for concepts with insufficient information to code with codable children: Secondary | ICD-10-CM

## 2019-01-14 DIAGNOSIS — E1165 Type 2 diabetes mellitus with hyperglycemia: Secondary | ICD-10-CM

## 2019-01-14 DIAGNOSIS — E118 Type 2 diabetes mellitus with unspecified complications: Secondary | ICD-10-CM

## 2019-01-14 LAB — POCT GLYCOSYLATED HEMOGLOBIN (HGB A1C): HbA1c, POC (controlled diabetic range): 12.5 % — AB (ref 0.0–7.0)

## 2019-01-14 MED ORDER — TRESIBA FLEXTOUCH 100 UNIT/ML ~~LOC~~ SOPN: 24.0000 [IU] | PEN_INJECTOR | Freq: Every day | SUBCUTANEOUS | 0 refills | Status: DC

## 2019-01-14 NOTE — Patient Instructions (Signed)
It was nice seeing you today Mr. Andress!  Your hemoglobin A1c today was 12.5, which means that your sugar has been very high over the last 3 months.  This is causing her weight loss and your diminished feeling in your hands and feet.  Today we are increasing your Tresiba dose to 24 units/day.  If your sugar is below 100 in the morning for you feel like your sugar is low, please decrease this dose back down the 22 units/day.  I would like for you to call me in about 1 week to let me know what your sugars have been so we can decide how to change your insulin dose.  I am making a referral today to a group that can hopefully help with transportation and finances, and they should contact you in the next couple weeks.  I would like to see you back in 3 months.  If you have any questions or concerns, please feel free to call the clinic.   Be well,  Dr. Shan Levans

## 2019-01-14 NOTE — Progress Notes (Signed)
Subjective:    Brendan [name] - 62 y.o. male MRN [id number]  Date of birth: 1955/04/22  CC:  Brendan [name] is here for follow-up of his type 2 diabetes.  HPI: Type 2 diabetes Meds: Tresiba 20 units daily, cannot afford any medications and relies on our samples of Tresiba for treatment CBGs 200s fasting, 300 for more after meals When not on Tresiba (which has been one month or slightly longer) CBGs were in the 500s-600s Had polydypsia, polyphagia, and polyuria during this.  And also noted weight loss Has trouble getting a ride to the office d/t COVID, so he was unable to pick up [place] samples or be seen.  He cannot drive due to his insensate feet.  Peripheral neuropathy Frequent falls, including a fall that required him to go to the [facility] emergency department due to a severely swollen knee that required antibiotics Cannot feel with his hands or feet, is in the process of acquiring disability  Health Maintenance:  Cannot see specialists or receive further tests or immunizations due to financial difficulties and lack of insurance Health Maintenance Due  Topic Date Due  . PNEUMOCOCCAL POLYSACCHARIDE VACCINE AGE 68-64 HIGH RISK  10/14/1959  . OPHTHALMOLOGY EXAM  10/14/1967  . COLONOSCOPY  10/14/2007  . TETANUS/TDAP  05/16/2013  . INFLUENZA VACCINE  08/17/2018  . FOOT EXAM  08/22/2018  . URINE MICROALBUMIN  08/22/2018    -  reports that he has never smoked. His smokeless tobacco use includes snuff and chew. - Review of Systems: Per HPI. - Past Medical History: Patient Active Problem List   Diagnosis Date Noted  . Uncontrolled type 2 diabetes mellitus with hyperglycemia ([place]) 08/22/2017  . Frequent falls 05/29/2017  . Adjustment disorder with depressed mood 05/29/2017  . Peripheral neuropathy 05/29/2017  . Lower extremity pain 05/19/2013  . Healthcare maintenance 03/11/2012  . Elevated blood pressure 03/11/2012  . Type II diabetes mellitus with complication,  uncontrolled ([place]) 03/15/2006   - Medications: reviewed and updated   Objective:   Physical Exam BP 128/74   Pulse 92   Wt 134 lb 12.8 oz (61.1 kg)   SpO2 98%   BMI 19.34 kg/m  Gen: NAD, alert, cooperative with exam, appears thin with bitemporal wasting CV: RRR, good S1/S2, no murmur, no edema, capillary refill brisk  Resp: CTABL, no wheezes, non-labored Skin: no rashes, normal turgor, scab on right knee  Psych: good insight, alert and oriented Extremities: Hands insensate to monofilament testing bilaterally.  Diabetic Foot Exam - Simple   Simple Foot Form Visual Inspection No deformities, no ulcerations, no other skin breakdown bilaterally: Yes Sensation Testing See comments: Yes Pulse Check Posterior Tibialis and Dorsalis pulse intact bilaterally: Yes Comments Completely insensate to touch and monofilament testing bilaterally.          Assessment & Plan:   Type II diabetes mellitus with complication, uncontrolled ([place]) Patient unfortunately had an increase in his hemoglobin A1c to 12.5 today, likely due to being out of [place] for over 1 month and suboptimal control even with [place].  We will increase his Tresiba dose from 20 units to 24 units daily.  Instructed patient to cut back to 22 units daily if his fasting sugar is less than 100 or if he feels hypoglycemic.  He will call me in 1 week and report his blood sugar so that we can further titrate his insulin.  4 pins were supplied a sample today.  Strongly encouraged patient to work on obtaining insurance  so that we have more options for him.  Also sent referral for chronic care management to see if they can help alleviate his financial and transportation issues.    Maia Breslow, M.D. 01/15/2019, 2:42 PM PGY-3, [place]

## 2019-01-15 NOTE — Assessment & Plan Note (Signed)
Patient unfortunately had an increase in his hemoglobin A1c to 12.5 today, likely due to being out of [place] for over 1 month and suboptimal control even with [place].  We will increase his Tresiba dose from 20 units to 24 units daily.  Instructed patient to cut back to 22 units daily if his fasting sugar is less than 100 or if he feels hypoglycemic.  He will call me in 1 week and report his blood sugar so that we can further titrate his insulin.  4 pins were supplied a sample today.  Strongly encouraged patient to work on obtaining insurance so that we have more options for him.  Also sent referral for chronic care management to see if they can help alleviate his financial and transportation issues.

## 2019-01-21 ENCOUNTER — Ambulatory Visit: Payer: Self-pay | Admitting: Licensed Clinical Social Worker

## 2019-01-21 NOTE — Chronic Care Management (AMB) (Signed)
   Clinical Social Work Care Management Outreach Note  01/21/2019 Name: [name] MRN: [id number] DOB: Jun 08, 1955  Brendan [name] is a 64 y.o. year old male who is a primary care patient of Winfrey, Alcario Drought, MD . The Care Management team was consulted for assistance with Transportation Needs  and Financial Difficulties related to medication .   LCSW reached out to [employer] today by phone.  Patient's wife answered the phone and scheduled phone appointment for patient 01/22/19 at 2:15  Casimer Lanius, [place] / [place]   [phone] 3:06 PM

## 2019-01-22 ENCOUNTER — Other Ambulatory Visit: Payer: Self-pay

## 2019-01-22 ENCOUNTER — Ambulatory Visit: Payer: Self-pay | Admitting: Licensed Clinical Social Worker

## 2019-01-22 NOTE — Chronic Care Management (AMB) (Signed)
Social Work Care Management  Initial Visit Note  01/22/2019 Name: Brendan [name] MRN: [id number] DOB: 03-20-1955  Assessment: ZAKK [name] is a 64 y.o. year old male who sees Winfrey, Alcario Drought, MD for primary care. The care management team was consulted for assistance with care management and care coordination needs related to Medication Management and Education, Medication Assistance and Psychosocial Support.   Review of patient status, including review of consultants reports, relevant laboratory and other test results, and collaboration with appropriate care team members and the patient's provider was performed as part of comprehensive patient evaluation and provision of care management services.    SDOH (Social Determinants of Health) screening performed today: . See Care Plan for related entries.   Outpatient Encounter Medications as of 01/22/2019  Medication Sig  . Blood Glucose Monitoring Suppl (RELION PRIME MONITOR) DEVI 1 Device by Does not apply route as directed.  . gabapentin (NEURONTIN) 300 MG capsule Take 1 capsule (300 mg total) by mouth 3 (three) times daily.  Marland Kitchen glucose blood (RELION PRIME TEST) test strip Use to test blood sugar up to 4 times daily.  . insulin aspart (NOVOLOG FLEXPEN) 100 UNIT/ML FlexPen Inject 6 Units into the skin 2 (two) times daily before a meal. Before the largest meal of the day.  . insulin degludec (TRESIBA FLEXTOUCH) 100 UNIT/ML SOPN FlexTouch Pen Inject 0.24 mLs (24 Units total) into the skin daily before breakfast.  . INSULIN SYRINGE .5CC/28G 28G X 1/2" 0.5 ML MISC 1 Syringe by Does not apply route 2 (two) times daily.  . metFORMIN (GLUCOPHAGE) 500 MG tablet Take 1 tablet (500 mg total) by mouth 2 (two) times daily with a meal.  . ReliOn Ultra Thin Lancets MISC Use to test blood sugar up to 4 times daily.  . rosuvastatin (CRESTOR) 20 MG tablet Take 1 tablet (20 mg total) by mouth daily.   No facility-administered encounter medications on file as of  01/22/2019.   Goals Addressed            This Visit's Progress   . community resources       Current Barriers:  . Unmet insurance need for patient with DMII . Patient needs support, education, and care coordination needs related to available community resources.  . Patient is unemployed; applied for disability in 2020; wife assisting with transportation needs  Clinical Goal(s)  . Over the next 30 days, patient will follow up with DSS to apply for Medicaid, contact Medication Assistance program and call [employer] medical Transportation  Interventions :  . Assessed patient's care coordination needs  . Provided patient with information about transportation resources, and Medicaid. . Advised patient to contact agencies . Collaborated with appropriate clinical care team members Patient Self Care Activities & Deficits:  . Patient is able to contact agencies discussed today . Patient is unable to independently navigate community resource options without care coordination support . Acknowledges deficits related to resources and is motivated to resolve concern  Initial goal documentation    . Need help with obtaining medication       Current Barriers:  . Chronic Disease Management support, education, and care coordination needs related to DMII Clinical Goal(s) related to DMII:  Over the next 30 days, patient will:  . Work with the care management team to address educational, disease management, and care coordination needs and schedule appointment with Almyra Free  Interventions: . Assessed patient's care coordination needs and discussed care management follow up from [place] for medication needs .  LCSW provided patient with phone number to contact Medication Assistance program Patient Self Care Activities  . Patient is unable to independently self-manage chronic health conditions . Is motivated for improvement and open to receive assistance  Initial goal documentation      Follow up  plan:  1. Patient referred to [place] phone appointment scheduled  01/28/19 2. LCSW will F/U with patient in 2 weeks  Brendan [name], [place] / [place]   [phone] 2:55 PM

## 2019-01-22 NOTE — Patient Instructions (Signed)
Licensed Clinical Social Worker Visit Information Mr. Brendan [name]  it was nice speaking with you. Please call me directly if you have questions [phone] Goals we discussed today:  Goals Addressed            This Visit's Progress   . community resources       Current Barriers:  . Unmet insurance need for patient with DMII . Patient needs support, education, and care coordination needs related to available community resources.  . Patient is unemployed; applied for disability in 2020,  Clinical Goal(s)  . Over the next 30 days, patient will follow up with DSS to apply for Medicaid, contact Medication Assistance program and call [employer] medical Transportation  Interventions :  . Assessed patient's care coordination needs  . Provided patient with information about transportation resources, and Medicaid. . Advised patient to contact agencies . Collaborated with appropriate clinical care team members Patient Self Care Activities & Deficits:  . Patient is able to contact agencies discussed today . Patient is unable to independently navigate community resource options without care coordination support . Acknowledges deficits related to resources and is motivated to resolve concern  Initial goal documentation    . Need help with obtaining medication       Current Barriers:  . Chronic Disease Management support, education, and care coordination needs related to DMII Clinical Goal(s) related to DMII:  Over the next 30 days, patient will:  . Work with the care management team to address educational, disease management, and care coordination needs and schedule appointment with Brendan [name]  Interventions: . Assessed patient's care coordination needs and discussed care management follow up from [place] for medication needs . LCSW provided patient with phone number to contact Medication Assistance program Patient Self Care Activities  . Patient is unable to independently self-manage chronic  health conditions . Is motivated for improvement and open to receive assistance  Initial goal documentation        The patient verbalized understanding of instructions provided today and declined a print copy of patient instruction materials.  Follow up plan: appointment with [place], LCSW

## 2019-01-28 ENCOUNTER — Telehealth: Payer: Self-pay

## 2019-01-28 ENCOUNTER — Ambulatory Visit: Payer: Self-pay | Admitting: Pharmacist

## 2019-01-28 DIAGNOSIS — E1165 Type 2 diabetes mellitus with hyperglycemia: Secondary | ICD-10-CM

## 2019-01-28 NOTE — Progress Notes (Signed)
  Chronic Care Management   Outreach Note  01/28/2019 Name: Brendan [name] MRN: [id number] DOB: Sep 24, 1955  Referred by: [name], MD Reason for referral : Chronic Care Management   Spoke with patient by telephone today.  He stated he was going into bank and did not have time to speak.  Encouraged patient to call back.  Follow Up Plan: The care management team will reach out to the patient again over the next 7 days.   SIGNATURE Regina Eck, PharmD, BCPS Clinical Pharmacist, [place]: [phone]

## 2019-02-04 ENCOUNTER — Other Ambulatory Visit: Payer: Self-pay

## 2019-02-04 ENCOUNTER — Ambulatory Visit: Payer: Self-pay

## 2019-02-04 DIAGNOSIS — E1165 Type 2 diabetes mellitus with hyperglycemia: Secondary | ICD-10-CM

## 2019-02-12 ENCOUNTER — Ambulatory Visit: Payer: Self-pay | Admitting: Licensed Clinical Social Worker

## 2019-02-12 NOTE — Chronic Care Management (AMB) (Addendum)
  Social Work Care Management  Outreach Note  02/12/2019 Name: Brendan [name] MRN: [id number] DOB: 26-May-1955  Referred by: [name], MD Reason for referral : Care Coordination (F/U)  An unsuccessful telephone outreach was attempted today. Patient is unavailable until after 3:00 as he does not have access to a phone. Follow Up Plan: LCSW will call back after 3:00 in the next 2 to 3 days.  2nd  unsuccessful telephone outreach was attempted today at 4:45. Left voice message to call LCSW. Follow Up Plan:  1. LCSW will wait for return call. 2. No additional outreach scheduled at this time  Casimer Lanius, [place] / [place]   [phone] 9:52 AM  4:44 PM

## 2019-02-13 NOTE — Progress Notes (Signed)
Chronic Care Management   Visit Note  02/04/2019 Name: Brendan [name] MRN: [id number] DOB: 08-Jan-1956  Referred by: [name], MD Reason for referral : Chronic Care Management   Brendan [name] is a 64 y.o. year old male who is a primary care patient of Winfrey, Alcario Drought, MD. The CCM team was consulted for assistance with chronic disease management and care coordination needs related to DMII  Review of patient status, including review of consultants reports, relevant laboratory and other test results, and collaboration with appropriate care team members and the patient's provider was performed as part of comprehensive patient evaluation and provision of chronic care management services.     Medications: Outpatient Encounter Medications as of 02/04/2019  Medication Sig  . Blood Glucose Monitoring Suppl (RELION PRIME MONITOR) DEVI 1 Device by Does not apply route as directed.  . gabapentin (NEURONTIN) 300 MG capsule Take 1 capsule (300 mg total) by mouth 3 (three) times daily.  Marland Kitchen glucose blood (RELION PRIME TEST) test strip Use to test blood sugar up to 4 times daily.  . insulin aspart (NOVOLOG FLEXPEN) 100 UNIT/ML FlexPen Inject 6 Units into the skin 2 (two) times daily before a meal. Before the largest meal of the day.  . insulin degludec (TRESIBA FLEXTOUCH) 100 UNIT/ML SOPN FlexTouch Pen Inject 0.24 mLs (24 Units total) into the skin daily before breakfast.  . INSULIN SYRINGE .5CC/28G 28G X 1/2" 0.5 ML MISC 1 Syringe by Does not apply route 2 (two) times daily.  . metFORMIN (GLUCOPHAGE) 500 MG tablet Take 1 tablet (500 mg total) by mouth 2 (two) times daily with a meal.  . ReliOn Ultra Thin Lancets MISC Use to test blood sugar up to 4 times daily.  . rosuvastatin (CRESTOR) 20 MG tablet Take 1 tablet (20 mg total) by mouth daily.   No facility-administered encounter medications on file as of 02/04/2019.     Objective:   Goals Addressed            This Visit's Progress       Patient Stated   . I would like to manage my diabetes (pt-stated)       Current Barriers:  . Diabetes: 123456; complicated by chronic medical conditions including HTN, neuropath, most recent A1c 12.5% on 12/29 . Current antihyperglycemic regimen: Tresiba 24 units qHS, Novolog 6 units BID w/ meals, metformin o Application submitted for [employer] (basaglar/humalog) o Consider GLP-1 . Denies hypoglycemic symptoms . Reports including:polyuria, polydipsia, polyphagia, neuropathy . Current meal patterns: discussed low carb lifestyle, avoiding sugary drinks, increasing exercise . Current exercise: n/a- encouraged . Current blood glucose readings: FBGs 200-300 . Cardiovascular risk reduction: o Current hypertensive regimen: n/a-will discuss o Current hyperlipidemia regimen: rosuvastatin 20mg  qhs o Current antiplatelet regimen: n/a  Pharmacist Clinical Goal(s):  Marland Kitchen Over the next 180 days, patient will work with PharmD and primary care provider to address needs related to diabetes management  Interventions: . Comprehensive medication review performed, medication list updated in electronic medical record . Reviewed & discussed the following diabetes-related information with patient: o Continue checking blood sugars as directed o Follow ADA recommended "diabetes-friendly" diet  (reviewed healthy snack/food options) o Reviewed medication purpose/side effects-->patient denies adverse events  Patient Self Care Activities:  . Patient will check blood glucose daily , document, and provide at future appointments . Patient will focus on medication adherence . Patient will take medications as prescribed . Patient will contact provider with any episodes of hypoglycemia . Patient will report  any questions or concerns to provider   Initial goal documentation          Plan:   The care management team will reach out to the patient again over the next 30 days.   Provider Signature Regina Eck, PharmD, BCPS Clinical Pharmacist, [place]: [phone]

## 2019-02-18 ENCOUNTER — Other Ambulatory Visit: Payer: Self-pay

## 2019-02-18 ENCOUNTER — Telehealth: Payer: Self-pay

## 2019-02-24 ENCOUNTER — Other Ambulatory Visit: Payer: Self-pay | Admitting: Family Medicine

## 2019-02-24 ENCOUNTER — Telehealth: Payer: Self-pay

## 2019-02-24 MED ORDER — RELION BLOOD GLUCOSE TEST VI STRP
ORAL_STRIP | 12 refills | Status: AC
Start: 2019-02-24 — End: ?

## 2019-02-24 MED ORDER — RELION CONFIRM GLUCOSE MONITOR W/DEVICE KIT: 1.0000 | PACK | Freq: Once | 0 refills | Status: AC

## 2019-02-24 NOTE — Telephone Encounter (Signed)
ReliOn device kit and test strips sent to the [facility] in [place].

## 2019-02-24 NOTE — Telephone Encounter (Signed)
Patient calls nurse line in regards to broken blood glucose machine. Patient says that his meter has been broken for the last two days. Patient does not have insurance and was requesting meter from [place]. Do we have a program for this?  To PCP  Please advise

## 2019-02-24 NOTE — Telephone Encounter (Signed)
We do not have sample meters at this time.   For cash/uninsured patients, it is best to suggest the purchase a RELION blood glucose meter, lancets and strips from [facility] as they are the least expensive.   Total cost for all three of the "cheapest" meter and strips should be < $30 for a 1-2 month supply of strips.

## 2019-02-24 NOTE — Telephone Encounter (Signed)
Called and LM for patient to return phone call.  Talbot Grumbling, RN

## 2019-04-08 ENCOUNTER — Ambulatory Visit: Payer: Self-pay | Admitting: Pharmacist

## 2019-04-08 DIAGNOSIS — E1165 Type 2 diabetes mellitus with hyperglycemia: Secondary | ICD-10-CM

## 2019-04-10 NOTE — Progress Notes (Signed)
Chronic Care Management  Visit Note  04/08/2019 Name: Brendan [name] MRN: [id number] DOB: 1955/09/06  Referred by: [name], MD Reason for referral : Chronic Care Management and Diabetes   Brendan [name] is a 64 y.o. year old male who is a primary care patient of Winfrey, Alcario Drought, MD. The CCM team was consulted for assistance with chronic disease management and care coordination needs related to DMII  Review of patient status, including review of consultants reports, relevant laboratory and other test results, and collaboration with appropriate care team members and the patient's provider was performed as part of comprehensive patient evaluation and provision of chronic care management services.    SDOH (Social Determinants of Health) assessments performed: No See Care Plan activities for detailed interventions related to SDOH     Medications: Outpatient Encounter Medications as of 04/08/2019  Medication Sig Note  . Insulin Glargine (BASAGLAR KWIKPEN) 100 UNIT/ML Inject 28 Units into the skin at bedtime. 3/23/2021[facility] cares patient assistance program 2021  . insulin lispro (HUMALOG) 100 UNIT/ML injection Inject 8 Units into the skin 2 (two) times daily before a meal. 04/08/2019: [employer] cares patient assistance program 2021  . Insulin Pen Needle (PEN NEEDLES) 32G X 4 MM MISC    . gabapentin (NEURONTIN) 300 MG capsule Take 1 capsule (300 mg total) by mouth 3 (three) times daily.   Marland Kitchen glucose blood (RELION GLUCOSE TEST STRIPS) test strip Use as instructed   . metFORMIN (GLUCOPHAGE) 500 MG tablet Take 1 tablet (500 mg total) by mouth 2 (two) times daily with a meal.   . ReliOn Ultra Thin Lancets MISC Use to test blood sugar up to 4 times daily.   . rosuvastatin (CRESTOR) 20 MG tablet Take 1 tablet (20 mg total) by mouth daily.   . [DISCONTINUED] insulin aspart (NOVOLOG FLEXPEN) 100 UNIT/ML FlexPen Inject 6 Units into the skin 2 (two) times daily before a meal. Before the largest  meal of the day.   . [DISCONTINUED] insulin degludec (TRESIBA FLEXTOUCH) 100 UNIT/ML SOPN FlexTouch Pen Inject 0.24 mLs (24 Units total) into the skin daily before breakfast.   . [DISCONTINUED] INSULIN SYRINGE .5CC/28G 28G X 1/2" 0.5 ML MISC 1 Syringe by Does not apply route 2 (two) times daily.    No facility-administered encounter medications on file as of 04/08/2019.     Objective:   Goals Addressed            This Visit's Progress     Patient Stated   . I would like to manage my diabetes (pt-stated)       Current Barriers:  . Diabetes: L3JQ; complicated by chronic medical conditions including HTN, neuropath, most recent A1c 12.5% on 12/29 . Current antihyperglycemic regimen: Basaglar (increase to 28 units qHS), Increase Humalog to 8 units BID w/ meals, metformin o Application submitted for [employer] (basaglar/humalog)--patient approved for calendar year 2021;  Gave pt phone number to call in refills to [facility] [phone] understanding o Consider GLP-1 in future, awaiting medicaid o Needs repeat A1c . Denies hypoglycemic symptoms . Reports including:polyuria, polydipsia, polyphagia, neuropathy . Current meal patterns: discussed low carb lifestyle, avoiding sugary drinks, increasing exercise . Current exercise: n/a- encouraged . Current blood glucose readings: FBGs 180-200, improving . Cardiovascular risk reduction: o Current hypertensive regimen: n/a-continue to assess o Current hyperlipidemia regimen: rosuvastatin 20mg  qhs o Current antiplatelet regimen: n/a  Pharmacist Clinical Goal(s):  Marland Kitchen Over the next 180 days, patient will work with PharmD and primary  care provider to address needs related to diabetes management  Interventions: . Comprehensive medication review performed, medication list updated in electronic medical record . Reviewed & discussed the following diabetes-related information with patient: o Continue checking blood sugars as  directed o Follow ADA recommended "diabetes-friendly" diet  (reviewed healthy snack/food options) o Reviewed medication purpose/side effects-->patient denies adverse events  Patient Self Care Activities:  . Patient will check blood glucose daily , document, and provide at future appointments . Patient will focus on medication adherence . Patient will take medications as prescribed . Patient will contact provider with any episodes of hypoglycemia . Patient will report any questions or concerns to provider   Please see past updates related to this goal by clicking on the "Past Updates" button in the selected goal          Provider Signature  Regina Eck, PharmD, [place] Pharmacist, [place]: [phone]

## 2019-04-11 DIAGNOSIS — E785 Hyperlipidemia, unspecified: Secondary | ICD-10-CM

## 2019-04-11 DIAGNOSIS — E1165 Type 2 diabetes mellitus with hyperglycemia: Secondary | ICD-10-CM

## 2019-04-11 DIAGNOSIS — I214 Non-ST elevation (NSTEMI) myocardial infarction: Secondary | ICD-10-CM

## 2019-04-12 DIAGNOSIS — I361 Nonrheumatic tricuspid (valve) insufficiency: Secondary | ICD-10-CM

## 2019-04-12 DIAGNOSIS — I255 Ischemic cardiomyopathy: Secondary | ICD-10-CM

## 2019-04-12 DIAGNOSIS — I340 Nonrheumatic mitral (valve) insufficiency: Secondary | ICD-10-CM

## 2019-04-14 ENCOUNTER — Encounter (HOSPITAL_COMMUNITY): Payer: Self-pay | Admitting: Internal Medicine

## 2019-04-14 ENCOUNTER — Inpatient Hospital Stay (HOSPITAL_COMMUNITY)
Admission: AD | Admit: 2019-04-14 | Discharge: 2019-07-17 | DRG: 001 | Disposition: E | Payer: Medicaid Other | Source: Other Acute Inpatient Hospital | Attending: Cardiothoracic Surgery | Admitting: Cardiothoracic Surgery

## 2019-04-14 ENCOUNTER — Telehealth: Payer: Self-pay | Admitting: *Deleted

## 2019-04-14 ENCOUNTER — Inpatient Hospital Stay (HOSPITAL_COMMUNITY): Payer: Medicaid Other

## 2019-04-14 DIAGNOSIS — E11649 Type 2 diabetes mellitus with hypoglycemia without coma: Secondary | ICD-10-CM | POA: Diagnosis present

## 2019-04-14 DIAGNOSIS — I5021 Acute systolic (congestive) heart failure: Secondary | ICD-10-CM

## 2019-04-14 DIAGNOSIS — N170 Acute kidney failure with tubular necrosis: Secondary | ICD-10-CM | POA: Diagnosis present

## 2019-04-14 DIAGNOSIS — I480 Paroxysmal atrial fibrillation: Secondary | ICD-10-CM | POA: Diagnosis present

## 2019-04-14 DIAGNOSIS — Z419 Encounter for procedure for purposes other than remedying health state, unspecified: Secondary | ICD-10-CM

## 2019-04-14 DIAGNOSIS — J9611 Chronic respiratory failure with hypoxia: Secondary | ICD-10-CM

## 2019-04-14 DIAGNOSIS — Z66 Do not resuscitate: Secondary | ICD-10-CM

## 2019-04-14 DIAGNOSIS — Z9911 Dependence on respirator [ventilator] status: Secondary | ICD-10-CM

## 2019-04-14 DIAGNOSIS — J189 Pneumonia, unspecified organism: Secondary | ICD-10-CM

## 2019-04-14 DIAGNOSIS — D696 Thrombocytopenia, unspecified: Secondary | ICD-10-CM | POA: Diagnosis not present

## 2019-04-14 DIAGNOSIS — G92 Toxic encephalopathy: Secondary | ICD-10-CM | POA: Diagnosis not present

## 2019-04-14 DIAGNOSIS — R0603 Acute respiratory distress: Secondary | ICD-10-CM

## 2019-04-14 DIAGNOSIS — Z0189 Encounter for other specified special examinations: Secondary | ICD-10-CM

## 2019-04-14 DIAGNOSIS — Z515 Encounter for palliative care: Secondary | ICD-10-CM | POA: Diagnosis not present

## 2019-04-14 DIAGNOSIS — D689 Coagulation defect, unspecified: Secondary | ICD-10-CM | POA: Diagnosis not present

## 2019-04-14 DIAGNOSIS — Y846 Urinary catheterization as the cause of abnormal reaction of the patient, or of later complication, without mention of misadventure at the time of the procedure: Secondary | ICD-10-CM | POA: Diagnosis not present

## 2019-04-14 DIAGNOSIS — Z9889 Other specified postprocedural states: Secondary | ICD-10-CM

## 2019-04-14 DIAGNOSIS — J90 Pleural effusion, not elsewhere classified: Secondary | ICD-10-CM | POA: Diagnosis not present

## 2019-04-14 DIAGNOSIS — F1011 Alcohol abuse, in remission: Secondary | ICD-10-CM | POA: Diagnosis present

## 2019-04-14 DIAGNOSIS — I471 Supraventricular tachycardia: Secondary | ICD-10-CM | POA: Diagnosis present

## 2019-04-14 DIAGNOSIS — E43 Unspecified severe protein-calorie malnutrition: Secondary | ICD-10-CM | POA: Insufficient documentation

## 2019-04-14 DIAGNOSIS — J690 Pneumonitis due to inhalation of food and vomit: Secondary | ICD-10-CM | POA: Diagnosis not present

## 2019-04-14 DIAGNOSIS — Z430 Encounter for attention to tracheostomy: Secondary | ICD-10-CM

## 2019-04-14 DIAGNOSIS — Z4659 Encounter for fitting and adjustment of other gastrointestinal appliance and device: Secondary | ICD-10-CM

## 2019-04-14 DIAGNOSIS — I080 Rheumatic disorders of both mitral and aortic valves: Secondary | ICD-10-CM | POA: Diagnosis present

## 2019-04-14 DIAGNOSIS — J158 Pneumonia due to other specified bacteria: Secondary | ICD-10-CM | POA: Diagnosis present

## 2019-04-14 DIAGNOSIS — Z23 Encounter for immunization: Secondary | ICD-10-CM

## 2019-04-14 DIAGNOSIS — E871 Hypo-osmolality and hyponatremia: Secondary | ICD-10-CM | POA: Diagnosis present

## 2019-04-14 DIAGNOSIS — G894 Chronic pain syndrome: Secondary | ICD-10-CM | POA: Diagnosis present

## 2019-04-14 DIAGNOSIS — T40605A Adverse effect of unspecified narcotics, initial encounter: Secondary | ICD-10-CM | POA: Diagnosis not present

## 2019-04-14 DIAGNOSIS — Z781 Physical restraint status: Secondary | ICD-10-CM

## 2019-04-14 DIAGNOSIS — R64 Cachexia: Secondary | ICD-10-CM | POA: Diagnosis not present

## 2019-04-14 DIAGNOSIS — K567 Ileus, unspecified: Secondary | ICD-10-CM | POA: Diagnosis not present

## 2019-04-14 DIAGNOSIS — Z681 Body mass index (BMI) 19 or less, adult: Secondary | ICD-10-CM | POA: Diagnosis not present

## 2019-04-14 DIAGNOSIS — I340 Nonrheumatic mitral (valve) insufficiency: Secondary | ICD-10-CM

## 2019-04-14 DIAGNOSIS — Z951 Presence of aortocoronary bypass graft: Secondary | ICD-10-CM

## 2019-04-14 DIAGNOSIS — E1143 Type 2 diabetes mellitus with diabetic autonomic (poly)neuropathy: Secondary | ICD-10-CM | POA: Diagnosis present

## 2019-04-14 DIAGNOSIS — B3789 Other sites of candidiasis: Secondary | ICD-10-CM | POA: Diagnosis not present

## 2019-04-14 DIAGNOSIS — E785 Hyperlipidemia, unspecified: Secondary | ICD-10-CM | POA: Diagnosis present

## 2019-04-14 DIAGNOSIS — J9811 Atelectasis: Secondary | ICD-10-CM | POA: Diagnosis not present

## 2019-04-14 DIAGNOSIS — B952 Enterococcus as the cause of diseases classified elsewhere: Secondary | ICD-10-CM | POA: Diagnosis not present

## 2019-04-14 DIAGNOSIS — J939 Pneumothorax, unspecified: Secondary | ICD-10-CM

## 2019-04-14 DIAGNOSIS — F17200 Nicotine dependence, unspecified, uncomplicated: Secondary | ICD-10-CM | POA: Diagnosis present

## 2019-04-14 DIAGNOSIS — E1140 Type 2 diabetes mellitus with diabetic neuropathy, unspecified: Secondary | ICD-10-CM | POA: Diagnosis present

## 2019-04-14 DIAGNOSIS — L89150 Pressure ulcer of sacral region, unstageable: Secondary | ICD-10-CM | POA: Diagnosis not present

## 2019-04-14 DIAGNOSIS — Z09 Encounter for follow-up examination after completed treatment for conditions other than malignant neoplasm: Secondary | ICD-10-CM

## 2019-04-14 DIAGNOSIS — Z20822 Contact with and (suspected) exposure to covid-19: Secondary | ICD-10-CM | POA: Diagnosis present

## 2019-04-14 DIAGNOSIS — I493 Ventricular premature depolarization: Secondary | ICD-10-CM | POA: Diagnosis not present

## 2019-04-14 DIAGNOSIS — Z833 Family history of diabetes mellitus: Secondary | ICD-10-CM

## 2019-04-14 DIAGNOSIS — N179 Acute kidney failure, unspecified: Secondary | ICD-10-CM

## 2019-04-14 DIAGNOSIS — I132 Hypertensive heart and chronic kidney disease with heart failure and with stage 5 chronic kidney disease, or end stage renal disease: Secondary | ICD-10-CM | POA: Diagnosis present

## 2019-04-14 DIAGNOSIS — I2510 Atherosclerotic heart disease of native coronary artery without angina pectoris: Secondary | ICD-10-CM

## 2019-04-14 DIAGNOSIS — K5903 Drug induced constipation: Secondary | ICD-10-CM | POA: Diagnosis not present

## 2019-04-14 DIAGNOSIS — E872 Acidosis: Secondary | ICD-10-CM | POA: Diagnosis present

## 2019-04-14 DIAGNOSIS — T8383XA Hemorrhage of genitourinary prosthetic devices, implants and grafts, initial encounter: Secondary | ICD-10-CM | POA: Diagnosis not present

## 2019-04-14 DIAGNOSIS — Z7189 Other specified counseling: Secondary | ICD-10-CM

## 2019-04-14 DIAGNOSIS — Z978 Presence of other specified devices: Secondary | ICD-10-CM

## 2019-04-14 DIAGNOSIS — J9601 Acute respiratory failure with hypoxia: Secondary | ICD-10-CM | POA: Diagnosis present

## 2019-04-14 DIAGNOSIS — L8990 Pressure ulcer of unspecified site, unspecified stage: Secondary | ICD-10-CM | POA: Insufficient documentation

## 2019-04-14 DIAGNOSIS — I501 Left ventricular failure: Secondary | ICD-10-CM

## 2019-04-14 DIAGNOSIS — I272 Pulmonary hypertension, unspecified: Secondary | ICD-10-CM | POA: Diagnosis present

## 2019-04-14 DIAGNOSIS — Z95811 Presence of heart assist device: Secondary | ICD-10-CM

## 2019-04-14 DIAGNOSIS — Z01818 Encounter for other preprocedural examination: Secondary | ICD-10-CM

## 2019-04-14 DIAGNOSIS — D62 Acute posthemorrhagic anemia: Secondary | ICD-10-CM | POA: Diagnosis not present

## 2019-04-14 DIAGNOSIS — I472 Ventricular tachycardia: Secondary | ICD-10-CM | POA: Diagnosis present

## 2019-04-14 DIAGNOSIS — Z79899 Other long term (current) drug therapy: Secondary | ICD-10-CM

## 2019-04-14 DIAGNOSIS — K5900 Constipation, unspecified: Secondary | ICD-10-CM

## 2019-04-14 DIAGNOSIS — Z930 Tracheostomy status: Secondary | ICD-10-CM

## 2019-04-14 DIAGNOSIS — I462 Cardiac arrest due to underlying cardiac condition: Secondary | ICD-10-CM | POA: Diagnosis not present

## 2019-04-14 DIAGNOSIS — K3184 Gastroparesis: Secondary | ICD-10-CM | POA: Diagnosis not present

## 2019-04-14 DIAGNOSIS — R778 Other specified abnormalities of plasma proteins: Secondary | ICD-10-CM

## 2019-04-14 DIAGNOSIS — J9502 Infection of tracheostomy stoma: Secondary | ICD-10-CM | POA: Diagnosis not present

## 2019-04-14 DIAGNOSIS — I5023 Acute on chronic systolic (congestive) heart failure: Secondary | ICD-10-CM | POA: Diagnosis present

## 2019-04-14 DIAGNOSIS — D61818 Other pancytopenia: Secondary | ICD-10-CM | POA: Diagnosis not present

## 2019-04-14 DIAGNOSIS — R570 Cardiogenic shock: Secondary | ICD-10-CM

## 2019-04-14 DIAGNOSIS — I5022 Chronic systolic (congestive) heart failure: Secondary | ICD-10-CM

## 2019-04-14 DIAGNOSIS — J9690 Respiratory failure, unspecified, unspecified whether with hypoxia or hypercapnia: Secondary | ICD-10-CM

## 2019-04-14 DIAGNOSIS — Z9689 Presence of other specified functional implants: Secondary | ICD-10-CM

## 2019-04-14 DIAGNOSIS — J150 Pneumonia due to Klebsiella pneumoniae: Secondary | ICD-10-CM

## 2019-04-14 DIAGNOSIS — B3749 Other urogenital candidiasis: Secondary | ICD-10-CM | POA: Diagnosis not present

## 2019-04-14 DIAGNOSIS — K831 Obstruction of bile duct: Secondary | ICD-10-CM

## 2019-04-14 DIAGNOSIS — K761 Chronic passive congestion of liver: Secondary | ICD-10-CM | POA: Diagnosis not present

## 2019-04-14 DIAGNOSIS — I700 Atherosclerosis of aorta: Secondary | ICD-10-CM | POA: Diagnosis present

## 2019-04-14 DIAGNOSIS — D649 Anemia, unspecified: Secondary | ICD-10-CM | POA: Diagnosis present

## 2019-04-14 DIAGNOSIS — Z992 Dependence on renal dialysis: Secondary | ICD-10-CM

## 2019-04-14 DIAGNOSIS — E1165 Type 2 diabetes mellitus with hyperglycemia: Secondary | ICD-10-CM | POA: Diagnosis present

## 2019-04-14 DIAGNOSIS — E875 Hyperkalemia: Secondary | ICD-10-CM | POA: Diagnosis not present

## 2019-04-14 DIAGNOSIS — Z823 Family history of stroke: Secondary | ICD-10-CM

## 2019-04-14 DIAGNOSIS — Z9289 Personal history of other medical treatment: Secondary | ICD-10-CM

## 2019-04-14 DIAGNOSIS — I509 Heart failure, unspecified: Secondary | ICD-10-CM

## 2019-04-14 DIAGNOSIS — Z800 Family history of malignant neoplasm of digestive organs: Secondary | ICD-10-CM

## 2019-04-14 DIAGNOSIS — Z9181 History of falling: Secondary | ICD-10-CM

## 2019-04-14 DIAGNOSIS — I214 Non-ST elevation (NSTEMI) myocardial infarction: Secondary | ICD-10-CM | POA: Diagnosis present

## 2019-04-14 DIAGNOSIS — Z7984 Long term (current) use of oral hypoglycemic drugs: Secondary | ICD-10-CM

## 2019-04-14 DIAGNOSIS — R34 Anuria and oliguria: Secondary | ICD-10-CM | POA: Diagnosis not present

## 2019-04-14 DIAGNOSIS — I255 Ischemic cardiomyopathy: Secondary | ICD-10-CM | POA: Diagnosis present

## 2019-04-14 DIAGNOSIS — Z952 Presence of prosthetic heart valve: Secondary | ICD-10-CM

## 2019-04-14 HISTORY — DX: Non-ST elevation (NSTEMI) myocardial infarction: I21.4

## 2019-04-14 HISTORY — DX: Acute kidney failure, unspecified: N17.9

## 2019-04-14 LAB — COMPREHENSIVE METABOLIC PANEL
ALT: 22 U/L (ref 0–44)
AST: 24 U/L (ref 15–41)
Albumin: 2.7 g/dL — ABNORMAL LOW (ref 3.5–5.0)
Alkaline Phosphatase: 89 U/L (ref 38–126)
Anion gap: 9 (ref 5–15)
BUN: 51 mg/dL — ABNORMAL HIGH (ref 8–23)
CO2: 22 mmol/L (ref 22–32)
Calcium: 8 mg/dL — ABNORMAL LOW (ref 8.9–10.3)
Chloride: 102 mmol/L (ref 98–111)
Creatinine, Ser: 1.28 mg/dL — ABNORMAL HIGH (ref 0.61–1.24)
GFR calc Af Amer: >60 mL/min (ref >60)
GFR calc non Af Amer: >60 mL/min (ref >60)
Glucose, Bld: 219 mg/dL — ABNORMAL HIGH (ref 70–99)
Potassium: 4 mmol/L (ref 3.5–5.1)
Sodium: 133 mmol/L — ABNORMAL LOW (ref 135–145)
Total Bilirubin: 0.8 mg/dL (ref 0.3–1.2)
Total Protein: 5.8 g/dL — ABNORMAL LOW (ref 6.5–8.1)

## 2019-04-14 LAB — TROPONIN I (HIGH SENSITIVITY): Troponin I (High Sensitivity): 8431 ng/L (ref <18)

## 2019-04-14 LAB — PROTIME-INR
INR: 1.3 — ABNORMAL HIGH (ref 0.8–1.2)
Prothrombin Time: 15.6 seconds — ABNORMAL HIGH (ref 11.4–15.2)

## 2019-04-14 LAB — MAGNESIUM: Magnesium: 2.5 mg/dL — ABNORMAL HIGH (ref 1.7–2.4)

## 2019-04-14 LAB — GLUCOSE, CAPILLARY: Glucose-Capillary: 185 mg/dL — ABNORMAL HIGH (ref 70–99)

## 2019-04-14 LAB — HEMOGLOBIN A1C
Hgb A1c MFr Bld: 9 % — ABNORMAL HIGH (ref 4.8–5.6)
Mean Plasma Glucose: 211.6 mg/dL

## 2019-04-14 LAB — HEPARIN LEVEL (UNFRACTIONATED): Heparin Unfractionated: 0.26 IU/mL — ABNORMAL LOW (ref 0.30–0.70)

## 2019-04-14 LAB — APTT: aPTT: 65 seconds — ABNORMAL HIGH (ref 24–36)

## 2019-04-14 MED ORDER — DOXYCYCLINE HYCLATE 100 MG PO TABS
100.0000 mg | ORAL_TABLET | Freq: Once | ORAL | Status: AC
  Administered 2019-04-14: 100 mg via ORAL
  Filled 2019-04-14: qty 1

## 2019-04-14 MED ORDER — INSULIN GLARGINE 100 UNIT/ML ~~LOC~~ SOLN
18.0000 [IU] | Freq: Every day | SUBCUTANEOUS | Status: DC
  Filled 2019-04-14: qty 0.18

## 2019-04-14 MED ORDER — ASPIRIN EC 81 MG PO TBEC
81.0000 mg | DELAYED_RELEASE_TABLET | Freq: Every day | ORAL | Status: DC
  Administered 2019-04-16 – 2019-04-21 (×4): 81 mg via ORAL
  Filled 2019-04-14 (×4): qty 1

## 2019-04-14 MED ORDER — HEPARIN (PORCINE) 25000 UT/250ML-% IV SOLN
1450.0000 [IU]/h | INTRAVENOUS | Status: DC
  Administered 2019-04-15: 1450 [IU]/h via INTRAVENOUS
  Filled 2019-04-14: qty 250

## 2019-04-14 MED ORDER — NITROGLYCERIN 0.4 MG SL SUBL: 0.4000 mg | SUBLINGUAL_TABLET | SUBLINGUAL | Status: DC | PRN

## 2019-04-14 MED ORDER — ONDANSETRON HCL 4 MG/2ML IJ SOLN: 4.0000 mg | Freq: Four times a day (QID) | INTRAMUSCULAR | Status: DC | PRN

## 2019-04-14 MED ORDER — INSULIN ASPART 100 UNIT/ML ~~LOC~~ SOLN
4.0000 [IU] | Freq: Three times a day (TID) | SUBCUTANEOUS | Status: DC
  Administered 2019-04-16 (×2): 4 [IU] via SUBCUTANEOUS

## 2019-04-14 MED ORDER — ACETAMINOPHEN 325 MG PO TABS: 650.0000 mg | ORAL_TABLET | ORAL | Status: DC | PRN

## 2019-04-14 MED ORDER — INSULIN GLARGINE 100 UNIT/ML ~~LOC~~ SOLN
20.0000 [IU] | Freq: Every day | SUBCUTANEOUS | Status: DC
  Administered 2019-04-14 – 2019-04-16 (×2): 20 [IU] via SUBCUTANEOUS
  Filled 2019-04-14 (×5): qty 0.2

## 2019-04-14 MED ORDER — INSULIN ASPART 100 UNIT/ML ~~LOC~~ SOLN
0.0000 [IU] | Freq: Three times a day (TID) | SUBCUTANEOUS | Status: DC
  Administered 2019-04-15: 1 [IU] via SUBCUTANEOUS
  Administered 2019-04-16 (×2): 8 [IU] via SUBCUTANEOUS
  Administered 2019-04-16: 3 [IU] via SUBCUTANEOUS

## 2019-04-14 NOTE — Telephone Encounter (Signed)
Wife wants to let PCP know that pt is in [facility] with "a heart attack and pneumonia".  She states that are transferring him here for a cardiac cath and she would like to speak with Dr. Shan Levans.  To PCP. Christen Bame, CMA

## 2019-04-14 NOTE — Progress Notes (Signed)
[place] for heparin Indication: chest pain/ACS  Heparin Dosing Weight: 68.2 kg  Labs: No results for input(s): HGB, HCT, PLT, APTT, LABPROT, INR, HEPARINUNFRC, HEPRLOWMOCWT, CREATININE, CKTOTAL, CKMB, TROPONINI in the last 72 hours.  Assessment: 38 yom presenting to [facility] with CP, transferred to [facility] for further evaluation. Cath scheduled for tomorrow. Patient was started on heparin IV on 3/26, currently running at 1300 units/hr per discussion with RN. Patient is not on anticoagulation PTA. Labs at [place] 9.7/28.6, plt 239, SCr 1.0. No active bleed issues reported.  Last aPTT was 56 on heparin at 1300 units/hr on 3/29 at 1754 per discussion with [facility].  Goal of Therapy:  Heparin level 0.3-0.7 units/ml Monitor platelets by anticoagulation protocol: Yes   Plan:  Continue heparin at 1300 units/h for now Check heparin level now Monitor daily heparin level and CBC, s/sx bleeding Cath planned tomorrow   Elicia Lamp, PharmD, BCPS Clinical Pharmacist 03/19/2019 9:32 PM

## 2019-04-14 NOTE — Progress Notes (Signed)
[place] for Heparin Indication: chest pain/ACS  Heparin Dosing Weight: 68.2 kg  Labs: Recent Labs    04/01/2019 2149 03/21/2019 2212  APTT 65*  --   LABPROT 15.6*  --   INR 1.3*  --   HEPARINUNFRC  --  0.26*    Assessment: 55 yom presenting to [facility] with CP, transferred to [facility] for further evaluation. Cath scheduled for tomorrow. Patient was started on heparin IV on 3/26, currently running at 1300 units/hr per discussion with RN. Patient is not on anticoagulation PTA. Labs at [place] 9.7/28.6, plt 239, SCr 1.0. No active bleed issues reported.  Last aPTT was 56 on heparin at 1300 units/hr on 3/29 at 1754 per discussion with [facility].  3/29 AM update:  Heparin level is sub-therapeutic   Goal of Therapy:  Heparin level 0.3-0.7 units/ml Monitor platelets by anticoagulation protocol: Yes   Plan:  Inc heparin to 1450 units/hr Re-check heparin level with AM labs  Narda Bonds, PharmD, [place] Pharmacist Phone: [phone]

## 2019-04-14 NOTE — H&P (Signed)
CARDIOLOGY ADMISSION NOTE  Patient ID: Brendan [name] MRN: [id number] DOB/AGE: Jul 05, 1955 64 y.o.  Admit date: 04/04/2019 Primary Cardiologist   No primary care provider on file. 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  He is now transferred to [facility] for evaluation of troponin elevation, evaluation of newly depressed EF, and consideration for coronary angiography.  History obtained from patient report as well as discharge paperwork from outside hospital.  Briefly, patient reportedly had nausea, vomiting, diarrhea approximately 1 week and is confined to his bed.  He finally presented to the outside hospital ED he was found to have a blood sugar of 516, BUN of 53, creatinine 1.2, and anion gap of 17.  Patient's BNP was elevated to 8900.  Troponin was elevated to 13.1 with changes noted by the team of their lateral leads as well as V1 and V2.  Patient was started on aspirin and a heparin drip.  Chest x-ray on admission was concerning for bilateral pulmonary opacities differential including infection and pulmonary edema.  So he was started on empiric ceftriaxone and doxycycline, although his procalcitonin was negative.  He had a chest wall echo that was performed that showed an EF of 25% was diuresed during his admission with improvement in his shortness of breath as well as radiographically.  On discussion with patient he has been chest pain-free since his admission to the outside hospital. In addition, he feels that his shortness of breath is much improved.  Past Medical History:  Diagnosis Date  . Cholecystitis 03/2013  . Complication of anesthesia   . Diabetes mellitus without complication ([place])   . Erectile dysfunction 2010  . HTN (hypertension)   .  PONV (postoperative nausea and vomiting)     Past Surgical History:  Procedure Laterality Date  . APPENDECTOMY  1969  . CHOLECYSTECTOMY    . CHOLECYSTECTOMY N/A 04/02/2013   Procedure: LAPAROSCOPIC CHOLECYSTECTOMY WITH  INTRAOPERATIVE CHOLANGIOGRAM;  Surgeon: Joyice Faster. Cornett, MD;  Location: [place];  Service: General;  Laterality: N/A;  . ERCP N/A 04/02/2013   Procedure: ENDOSCOPIC RETROGRADE CHOLANGIOPANCREATOGRAPHY (ERCP);  Surgeon: Inda Castle, MD;  Location: [place];  Service: Endoscopy;  Laterality: N/A;  . 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     No Known Allergies No current facility-administered medications on file prior to encounter.   Current Outpatient Medications on File Prior to Encounter  Medication Sig Dispense Refill  . gabapentin (NEURONTIN) 300 MG capsule Take 1 capsule (300 mg total) by mouth 3 (three) times daily. 90 capsule 3  . glucose blood (RELION GLUCOSE TEST STRIPS) test strip Use as instructed 100 each 12  . Insulin Glargine (BASAGLAR KWIKPEN) 100 UNIT/ML Inject 28 Units into the skin at bedtime.    . insulin lispro (HUMALOG) 100 UNIT/ML injection Inject 8 Units into the skin 2 (two) times daily before a meal.    . Insulin Pen Needle (PEN NEEDLES) 32G X 4 MM MISC     . metFORMIN (GLUCOPHAGE) 500 MG tablet Take 1 tablet (500 mg total) by mouth 2 (two) times daily with a meal. 60 tablet 3  . ReliOn Ultra Thin Lancets MISC Use to test blood sugar up to 4 times daily. [place]  each 3  . rosuvastatin (CRESTOR) 20 MG tablet Take 1 tablet (20 mg total) by mouth daily. 30 tablet 11   Social History   Socioeconomic History  . Marital status: Married    Spouse name: Not on file  . Number of children: Not on file  . Years of education: Not on file  . Highest education level: Not on file  Occupational History  . Not on file  Tobacco Use  . Smoking status: Never Smoker  . Smokeless tobacco: Current User     Types: Snuff, Chew  Substance and Sexual Activity  . Alcohol use: Yes    Comment: occ  . Drug use: No  . Sexual activity: Not on file  Other Topics Concern  . Not on file  Social History Narrative  . Not on file   Social Determinants of Health   Financial Resource Strain:   . Difficulty of Paying Living Expenses:   Food Insecurity:   . Worried About Charity fundraiser in the Last Year:   . Arboriculturist in the Last Year:   Transportation Needs:   . Film/video editor (Medical):   Marland Kitchen Lack of Transportation (Non-Medical):   Physical Activity:   . Days of Exercise per Week:   . Minutes of Exercise per Session:   Stress:   . Feeling of Stress :   Social Connections:   . Frequency of Communication with Friends and Family:   . Frequency of Social Gatherings with Friends and Family:   . Attends Religious Services:   . Active Member of Clubs or Organizations:   . Attends Archivist Meetings:   Marland Kitchen Marital Status:   Intimate Partner Violence:   . Fear of Current or Ex-Partner:   . Emotionally Abused:   Marland Kitchen Physically Abused:   . Sexually Abused:     Family History  Problem Relation Age of Onset  . Colon cancer Maternal Aunt   . Diabetes Maternal Grandmother   . Stroke Maternal Grandmother      Review of Systems: [y] = yes, [ ]  = no       General: Weight gain [] ; Weight loss [ ] ; Anorexia [ ] ; Fatigue [ ] ; Fever [ ] ; Chills [ ] ; Weakness [ ]     Cardiac: Chest pain/pressure [ ] ; Resting SOB [ ] ; Exertional SOB [ ] ; Orthopnea [ ] ; Pedal Edema [ ] ; Palpitations [ ] ; Syncope [ ] ; Presyncope [ ] ; Paroxysmal nocturnal dyspnea[ ]     Pulmonary: Cough [ ] ; Wheezing[ ] ; Hemoptysis[ ] ; Sputum [ ] ; Snoring [ ]     GI: Vomiting[ ] ; Dysphagia[ ] ; Melena[ ] ; Hematochezia [ ] ; Heartburn[ ] ; Abdominal pain [ ] ; Constipation [ ] ; Diarrhea [ ] ; BRBPR [ ]     GU: Hematuria[ ] ; Dysuria [ ] ; Nocturia[ ]   Vascular: Pain in legs with walking [ ] ; Pain in feet with lying flat [ ] ;  Non-healing sores [ ] ; Stroke [ ] ; TIA [ ] ; Slurred speech [ ] ;    Neuro: Headaches[ ] ; Vertigo[ ] ; Seizures[ ] ; Paresthesias[ ] ;Blurred vision [ ] ; Diplopia [ ] ; Vision changes [ ]     Ortho/Skin: Arthritis [ ] ; Joint pain [ ] ; Muscle pain [ ] ; Joint swelling [ ] ; Back Pain [ ] ; Rash [ ]     Psych: Depression[ ] ; Anxiety[ ]     Heme: Bleeding problems [ ] ; Clotting disorders [ ] ; Anemia [ ]     Endocrine: Diabetes [ ] ; Thyroid dysfunction[ ]   Physical Exam: Blood  pressure 96/64, temperature 98 F (36.7 C), temperature source Oral, height 5\' 11"  (1.803 m), weight 68.2 kg.   GENERAL: Patient is afebrile, Vital signs reviewed, Well appearing, Patient appears comfortable, Alert and lucid. EYES: Normal inspection. HEENT:  normocephalic, atraumatic , normal ENT inspection. ORAL:  Moist NECK:  supple , normal inspection. JVD elevated past angle of jaw CARD:  regular rate and rhythm, heart sounds normal. RESP:  no respiratory distress, bibasilar crackles appreciated ABD: soft, nontender to palpation, BS present, soft, no organomegaly or masses. BACK: non-tender. No CVA tenderness. MUSC:  normal ROM, non-tender, no pedal edema . SKIN: color normal, no rash, warm, dry . NEURO: awake & alert, lucid, no motor/sensory deficit. Gait stable. PSYCH: mood/affect normal.   Labs: Lab Results  Component Value Date   BUN 17 04/03/2013   Lab Results  Component Value Date   CREATININE 0.42 (L) 04/03/2013   Lab Results  Component Value Date   NA 134 (L) 04/03/2013   K 3.7 04/03/2013   CL 98 04/03/2013   CO2 20 04/03/2013   Lab Results  Component Value Date   TROPONINI <0.30 04/01/2013   Lab Results  Component Value Date   WBC 8.8 04/03/2013   HGB 11.3 (L) 04/03/2013   HCT 32.1 (L) 04/03/2013   MCV 87.9 04/03/2013   PLT 154 04/03/2013   Lab Results  Component Value Date   CHOL 174 08/21/2017   HDL 57 08/21/2017   LDLCALC 88 08/21/2017   TRIG 143 08/21/2017   CHOLHDL 3.1  08/21/2017   Lab Results  Component Value Date   ALT 100 (H) 04/03/2013   AST 30 04/03/2013   ALKPHOS 152 (H) 04/03/2013   BILITOT 2.3 (H) 04/03/2013      Radiology:  CXR:  04/09/2019 IMPRESSION: Stable airspace opacities bilaterally with effusions.  EKG: No dynamic ST changes  TTE: (04/12/19) 1.  Overall left ventricular systolic function is severely impaired with an EF between 25-30%. 2.  Pseudonormal LV filling pattern, consistent with elevated LA pressure. 3.  Basal lateral, basal inferior, mid lateral, inferior, apical lateral, and apical inferior LV wall motion is akinetic. 4.  Moderate multi jet mitral regurgitation is present. 5.  Mild to moderate tricuspid regurgitation present. 6.  The right ventricular systolic pressure, as measured by Doppler, is 54 mmHg.  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  He is now transferred to [facility] for evaluation of troponin elevation, evaluation of newly depressed EF, and consideration for coronary angiography.    # Acute Systolic Heart Failure # Bilateral Pulmonary Opacities  # Troponin I Elevation (13.10 -> 13.00 -> 17.10) # NSVT :: Patient presented with shortness of breath and elevated BNP of 8000.  TTE with EF of 25 to 30%.  Unclear etiology, but some concern for possible ischemia. -Patient n.p.o. for diagnostic catheterization in a.m. -Continue heparin drip (pharmacy to assist with dosing) -Monitor on telemetry -Strict I's and O's and daily weights -Continue spot dosing of Lasix; has been getting 20 mg IV daily at the outside hospital -We will hold off on antiarrhythmic drugs for now and most likely initiate amiodarone if no revascularizable disease on cath in a.m.  -Check thyroid panel   # Concern for PNA ::  Patient initially empirically started on antibiotics  due to concern for pneumonia.  Procalcitonin was negative at outside hospital with no leukocytosis, so low likelihood of infection. He was receiving Ceftriaxone and Doxy, but I think it is safe to stop these at this time given higher suspicion for pulmonary edema in setting of systolic heart failure.   # History of HTN :: Normotensive to hypotensive here.  We will hold off on antihypertensives and GDMT for the time being. We will attempt to introduce GDMT as appropriate.  # HLD - Cont Rosuvastatin 40mg  daily  # Diabetes Mellitus II (A1c 9.0%) - Dose reduce patient's home basal-bolus insulin regimen - Monitor blood sugars closely - Hypoglycemia protocol     Signed: Clois Dupes 03/19/2019, 9:20 PM

## 2019-04-14 NOTE — Telephone Encounter (Signed)
I called Ms. Njie and discussed her husband's hospital course so far with her.  I will be able to follow along more closely when he is transferred to [facility].  I encouraged her to call me if she has any updates or questions.  I will continue to follow closely.

## 2019-04-14 NOTE — Progress Notes (Signed)
Pt has arrived from [facility] via [place] for admit. Provider on call Dr Vickki Muff notified via [place]. Jessie Foot, RN

## 2019-04-15 ENCOUNTER — Ambulatory Visit (HOSPITAL_COMMUNITY): Admission: RE | Admit: 2019-04-15 | Payer: Self-pay | Source: Home / Self Care | Admitting: Cardiology

## 2019-04-15 ENCOUNTER — Ambulatory Visit: Payer: Self-pay

## 2019-04-15 ENCOUNTER — Inpatient Hospital Stay (HOSPITAL_COMMUNITY)
Admission: AD | Disposition: E | Payer: Self-pay | Source: Other Acute Inpatient Hospital | Attending: Cardiothoracic Surgery

## 2019-04-15 ENCOUNTER — Inpatient Hospital Stay: Payer: Self-pay

## 2019-04-15 ENCOUNTER — Other Ambulatory Visit: Payer: Self-pay

## 2019-04-15 ENCOUNTER — Inpatient Hospital Stay (HOSPITAL_COMMUNITY): Payer: Medicaid Other

## 2019-04-15 DIAGNOSIS — E1165 Type 2 diabetes mellitus with hyperglycemia: Secondary | ICD-10-CM

## 2019-04-15 DIAGNOSIS — IMO0002 Reserved for concepts with insufficient information to code with codable children: Secondary | ICD-10-CM

## 2019-04-15 DIAGNOSIS — I472 Ventricular tachycardia: Secondary | ICD-10-CM

## 2019-04-15 DIAGNOSIS — I361 Nonrheumatic tricuspid (valve) insufficiency: Secondary | ICD-10-CM

## 2019-04-15 DIAGNOSIS — I5021 Acute systolic (congestive) heart failure: Secondary | ICD-10-CM

## 2019-04-15 DIAGNOSIS — I214 Non-ST elevation (NSTEMI) myocardial infarction: Principal | ICD-10-CM

## 2019-04-15 DIAGNOSIS — I340 Nonrheumatic mitral (valve) insufficiency: Secondary | ICD-10-CM

## 2019-04-15 DIAGNOSIS — I5023 Acute on chronic systolic (congestive) heart failure: Secondary | ICD-10-CM

## 2019-04-15 HISTORY — PX: RIGHT/LEFT HEART CATH AND CORONARY ANGIOGRAPHY: CATH118266

## 2019-04-15 LAB — BASIC METABOLIC PANEL
Anion gap: 10 (ref 5–15)
Anion gap: 8 (ref 5–15)
BUN: 51 mg/dL — ABNORMAL HIGH (ref 8–23)
BUN: 36 mg/dL — ABNORMAL HIGH (ref 8–23)
CO2: 21 mmol/L — ABNORMAL LOW (ref 22–32)
CO2: 23 mmol/L (ref 22–32)
Calcium: 8 mg/dL — ABNORMAL LOW (ref 8.9–10.3)
Calcium: 7.9 mg/dL — ABNORMAL LOW (ref 8.9–10.3)
Chloride: 103 mmol/L (ref 98–111)
Chloride: 106 mmol/L (ref 98–111)
Creatinine, Ser: 1.26 mg/dL — ABNORMAL HIGH (ref 0.61–1.24)
Creatinine, Ser: 0.92 mg/dL (ref 0.61–1.24)
GFR calc Af Amer: >60 mL/min (ref >60)
GFR calc Af Amer: >60 mL/min (ref >60)
GFR calc non Af Amer: >60 mL/min (ref >60)
GFR calc non Af Amer: >60 mL/min (ref >60)
Glucose, Bld: 103 mg/dL — ABNORMAL HIGH (ref 70–99)
Glucose, Bld: 191 mg/dL — ABNORMAL HIGH (ref 70–99)
Potassium: 4.4 mmol/L (ref 3.5–5.1)
Potassium: 3.9 mmol/L (ref 3.5–5.1)
Sodium: 134 mmol/L — ABNORMAL LOW (ref 135–145)
Sodium: 137 mmol/L (ref 135–145)

## 2019-04-15 LAB — ECHOCARDIOGRAM COMPLETE
Height: 71 in
Weight: 2382.4 oz

## 2019-04-15 LAB — POCT I-STAT 7, (LYTES, BLD GAS, ICA,H+H)
Acid-base deficit: 4 mmol/L — ABNORMAL HIGH (ref 0.0–2.0)
Bicarbonate: 20.1 mmol/L (ref 20.0–28.0)
Calcium, Ion: 1.22 mmol/L (ref 1.15–1.40)
HCT: 28 % — ABNORMAL LOW (ref 39.0–52.0)
Hemoglobin: 9.5 g/dL — ABNORMAL LOW (ref 13.0–17.0)
O2 Saturation: 96 %
Potassium: 3.6 mmol/L (ref 3.5–5.1)
Sodium: 137 mmol/L (ref 135–145)
TCO2: 21 mmol/L — ABNORMAL LOW (ref 22–32)
pCO2 arterial: 33 mmHg (ref 32.0–48.0)
pH, Arterial: 7.392 (ref 7.350–7.450)
pO2, Arterial: 79 mmHg — ABNORMAL LOW (ref 83.0–108.0)

## 2019-04-15 LAB — POCT I-STAT EG7
Acid-base deficit: 4 mmol/L — ABNORMAL HIGH (ref 0.0–2.0)
Bicarbonate: 21.4 mmol/L (ref 20.0–28.0)
Calcium, Ion: 1.2 mmol/L (ref 1.15–1.40)
HCT: 27 % — ABNORMAL LOW (ref 39.0–52.0)
Hemoglobin: 9.2 g/dL — ABNORMAL LOW (ref 13.0–17.0)
O2 Saturation: 53 %
Potassium: 3.5 mmol/L (ref 3.5–5.1)
Sodium: 138 mmol/L (ref 135–145)
TCO2: 23 mmol/L (ref 22–32)
pCO2, Ven: 38.2 mmHg — ABNORMAL LOW (ref 44.0–60.0)
pH, Ven: 7.357 (ref 7.250–7.430)
pO2, Ven: 29 mmHg — CL (ref 32.0–45.0)

## 2019-04-15 LAB — GLUCOSE, CAPILLARY
Glucose-Capillary: 121 mg/dL — ABNORMAL HIGH (ref 70–99)
Glucose-Capillary: 95 mg/dL (ref 70–99)
Glucose-Capillary: 102 mg/dL — ABNORMAL HIGH (ref 70–99)
Glucose-Capillary: 197 mg/dL — ABNORMAL HIGH (ref 70–99)

## 2019-04-15 LAB — POCT I-STAT, CHEM 8
BUN: 34 mg/dL — ABNORMAL HIGH (ref 8–23)
Calcium, Ion: 1.12 mmol/L — ABNORMAL LOW (ref 1.15–1.40)
Chloride: 99 mmol/L (ref 98–111)
Creatinine, Ser: 0.9 mg/dL (ref 0.61–1.24)
Glucose, Bld: 325 mg/dL — ABNORMAL HIGH (ref 70–99)
HCT: 29 % — ABNORMAL LOW (ref 39.0–52.0)
Hemoglobin: 9.9 g/dL — ABNORMAL LOW (ref 13.0–17.0)
Potassium: 4 mmol/L (ref 3.5–5.1)
Sodium: 132 mmol/L — ABNORMAL LOW (ref 135–145)
TCO2: 23 mmol/L (ref 22–32)

## 2019-04-15 LAB — CBC
HCT: 29.7 % — ABNORMAL LOW (ref 39.0–52.0)
Hemoglobin: 9.8 g/dL — ABNORMAL LOW (ref 13.0–17.0)
MCH: 28.7 pg (ref 26.0–34.0)
MCHC: 33 g/dL (ref 30.0–36.0)
MCV: 86.8 fL (ref 80.0–100.0)
Platelets: 261 10*3/uL (ref 150–400)
RBC: 3.42 MIL/uL — ABNORMAL LOW (ref 4.22–5.81)
RDW: 12.2 % (ref 11.5–15.5)
WBC: 8.1 10*3/uL (ref 4.0–10.5)
nRBC: 0 % (ref 0.0–0.2)

## 2019-04-15 LAB — T4, FREE: Free T4: 1.1 ng/dL (ref 0.61–1.12)

## 2019-04-15 LAB — MAGNESIUM
Magnesium: 2.6 mg/dL — ABNORMAL HIGH (ref 1.7–2.4)
Magnesium: 2.3 mg/dL (ref 1.7–2.4)

## 2019-04-15 LAB — HEPARIN LEVEL (UNFRACTIONATED): Heparin Unfractionated: 0.56 IU/mL (ref 0.30–0.70)

## 2019-04-15 LAB — COOXEMETRY PANEL
Carboxyhemoglobin: 1.3 % (ref 0.5–1.5)
Methemoglobin: 1 % (ref 0.0–1.5)
O2 Saturation: 49.1 %
Total hemoglobin: 10.2 g/dL — ABNORMAL LOW (ref 12.0–16.0)

## 2019-04-15 LAB — BRAIN NATRIURETIC PEPTIDE: B Natriuretic Peptide: 655.7 pg/mL — ABNORMAL HIGH (ref 0.0–100.0)

## 2019-04-15 LAB — SURGICAL PCR SCREEN
MRSA, PCR: NEGATIVE
Staphylococcus aureus: NEGATIVE

## 2019-04-15 LAB — TROPONIN I (HIGH SENSITIVITY): Troponin I (High Sensitivity): 7674 ng/L (ref <18)

## 2019-04-15 LAB — TSH: TSH: 0.925 u[IU]/mL (ref 0.350–4.500)

## 2019-04-15 SURGERY — RIGHT/LEFT HEART CATH AND CORONARY ANGIOGRAPHY
Anesthesia: LOCAL

## 2019-04-15 MED ORDER — MAGNESIUM SULFATE 2 GM/50ML IV SOLN
INTRAVENOUS | Status: AC
  Filled 2019-04-15: qty 50

## 2019-04-15 MED ORDER — ASPIRIN 81 MG PO CHEW
81.0000 mg | CHEWABLE_TABLET | ORAL | Status: AC
  Administered 2019-04-15: 81 mg via ORAL
  Filled 2019-04-15: qty 1

## 2019-04-15 MED ORDER — METOPROLOL TARTRATE 5 MG/5ML IV SOLN
2.5000 mg | Freq: Once | INTRAVENOUS | Status: AC
  Administered 2019-04-15: 2.5 mg via INTRAVENOUS

## 2019-04-15 MED ORDER — AMIODARONE LOAD VIA INFUSION
150.0000 mg | Freq: Once | INTRAVENOUS | Status: AC
  Administered 2019-04-15: 150 mg via INTRAVENOUS
  Filled 2019-04-15: qty 83.34

## 2019-04-15 MED ORDER — HEPARIN (PORCINE) 25000 UT/250ML-% IV SOLN
1450.0000 [IU]/h | INTRAVENOUS | Status: DC
  Administered 2019-04-15: 1450 [IU]/h via INTRAVENOUS
  Administered 2019-04-16: 1550 [IU]/h via INTRAVENOUS
  Administered 2019-04-17 – 2019-04-18 (×2): 1450 [IU]/h via INTRAVENOUS
  Filled 2019-04-15 (×4): qty 250

## 2019-04-15 MED ORDER — LORAZEPAM 0.5 MG PO TABS
0.5000 mg | ORAL_TABLET | Freq: Once | ORAL | Status: AC | PRN
  Administered 2019-04-15: 0.5 mg via ORAL
  Filled 2019-04-15: qty 1

## 2019-04-15 MED ORDER — DIGOXIN 125 MCG PO TABS
0.1250 mg | ORAL_TABLET | Freq: Every day | ORAL | Status: DC
  Administered 2019-04-15 – 2019-04-21 (×5): 0.125 mg via ORAL
  Filled 2019-04-15 (×5): qty 1

## 2019-04-15 MED ORDER — MAGNESIUM SULFATE 2 GM/50ML IV SOLN
2.0000 g | Freq: Once | INTRAVENOUS | Status: AC
  Administered 2019-04-15: 2 g via INTRAVENOUS

## 2019-04-15 MED ORDER — TRAMADOL HCL 50 MG PO TABS
50.0000 mg | ORAL_TABLET | Freq: Four times a day (QID) | ORAL | Status: DC | PRN
  Administered 2019-04-15 – 2019-04-21 (×2): 50 mg via ORAL
  Filled 2019-04-15 (×2): qty 1

## 2019-04-15 MED ORDER — AMIODARONE HCL IN DEXTROSE 360-4.14 MG/200ML-% IV SOLN
INTRAVENOUS | Status: AC
  Filled 2019-04-15: qty 200

## 2019-04-15 MED ORDER — IOHEXOL 350 MG/ML SOLN
INTRAVENOUS | Status: DC | PRN
  Administered 2019-04-15: 30 mL

## 2019-04-15 MED ORDER — HEPARIN (PORCINE) IN NACL 1000-0.9 UT/500ML-% IV SOLN
INTRAVENOUS | Status: DC | PRN
  Administered 2019-04-15: 500 mL

## 2019-04-15 MED ORDER — SODIUM CHLORIDE 0.9% FLUSH: 10.0000 mL | INTRAVENOUS | Status: DC | PRN

## 2019-04-15 MED ORDER — ROSUVASTATIN CALCIUM 20 MG PO TABS
40.0000 mg | ORAL_TABLET | Freq: Every day | ORAL | Status: DC
  Administered 2019-04-15 – 2019-04-21 (×6): 40 mg via ORAL
  Filled 2019-04-15 (×6): qty 2

## 2019-04-15 MED ORDER — PNEUMOCOCCAL VAC POLYVALENT 25 MCG/0.5ML IJ INJ: 0.5000 mL | INJECTION | INTRAMUSCULAR | Status: DC

## 2019-04-15 MED ORDER — TEMAZEPAM 15 MG PO CAPS
15.0000 mg | ORAL_CAPSULE | Freq: Every evening | ORAL | Status: DC | PRN
  Administered 2019-04-15 – 2019-04-16 (×2): 15 mg via ORAL
  Filled 2019-04-15 (×2): qty 1

## 2019-04-15 MED ORDER — SPIRONOLACTONE 12.5 MG HALF TABLET
12.5000 mg | ORAL_TABLET | Freq: Every day | ORAL | Status: DC
  Administered 2019-04-15 – 2019-04-21 (×5): 12.5 mg via ORAL
  Filled 2019-04-15 (×5): qty 1

## 2019-04-15 MED ORDER — CHLORHEXIDINE GLUCONATE CLOTH 2 % EX PADS
6.0000 | MEDICATED_PAD | Freq: Every day | CUTANEOUS | Status: DC
  Administered 2019-04-15 – 2019-04-21 (×6): 6 via TOPICAL

## 2019-04-15 MED ORDER — SODIUM CHLORIDE 0.9% FLUSH
10.0000 mL | Freq: Two times a day (BID) | INTRAVENOUS | Status: DC
  Administered 2019-04-16: 30 mL
  Administered 2019-04-16 – 2019-04-21 (×7): 10 mL

## 2019-04-15 MED ORDER — SODIUM CHLORIDE 0.9 % IV SOLN: 250.0000 mL | INTRAVENOUS | Status: DC | PRN

## 2019-04-15 MED ORDER — AMIODARONE HCL IN DEXTROSE 360-4.14 MG/200ML-% IV SOLN
60.0000 mg/h | INTRAVENOUS | Status: DC
  Administered 2019-04-16: 30 mg/h via INTRAVENOUS
  Filled 2019-04-15 (×2): qty 200

## 2019-04-15 MED ORDER — LIDOCAINE HCL (PF) 1 % IJ SOLN
INTRAMUSCULAR | Status: DC | PRN
  Administered 2019-04-15 (×2): 3 mL
  Administered 2019-04-15: 15 mL

## 2019-04-15 MED ORDER — METOLAZONE 2.5 MG PO TABS
2.5000 mg | ORAL_TABLET | Freq: Every day | ORAL | Status: DC
  Administered 2019-04-15: 2.5 mg via ORAL
  Filled 2019-04-15: qty 1

## 2019-04-15 MED ORDER — POTASSIUM CHLORIDE CRYS ER 20 MEQ PO TBCR
40.0000 meq | EXTENDED_RELEASE_TABLET | Freq: Two times a day (BID) | ORAL | Status: AC
  Administered 2019-04-15 – 2019-04-17 (×4): 40 meq via ORAL
  Filled 2019-04-15 (×4): qty 2

## 2019-04-15 MED ORDER — MIDAZOLAM HCL 2 MG/2ML IJ SOLN
INTRAMUSCULAR | Status: DC | PRN
  Administered 2019-04-15: 1 mg via INTRAVENOUS

## 2019-04-15 MED ORDER — GLUCERNA SHAKE PO LIQD
237.0000 mL | Freq: Three times a day (TID) | ORAL | Status: DC
  Administered 2019-04-16: 237 mL via ORAL

## 2019-04-15 MED ORDER — ADULT MULTIVITAMIN W/MINERALS CH
1.0000 | ORAL_TABLET | Freq: Every day | ORAL | Status: DC
  Administered 2019-04-15 – 2019-04-17 (×3): 1 via ORAL
  Filled 2019-04-15 (×3): qty 1

## 2019-04-15 MED ORDER — SODIUM CHLORIDE 0.9% FLUSH: 3.0000 mL | INTRAVENOUS | Status: DC | PRN

## 2019-04-15 MED ORDER — SODIUM CHLORIDE 0.9 % IV SOLN: INTRAVENOUS | Status: DC

## 2019-04-15 MED ORDER — FENTANYL CITRATE (PF) 100 MCG/2ML IJ SOLN
INTRAMUSCULAR | Status: DC | PRN
  Administered 2019-04-15: 25 ug via INTRAVENOUS

## 2019-04-15 MED ORDER — MILRINONE LACTATE IN DEXTROSE 20-5 MG/100ML-% IV SOLN
0.1250 ug/kg/min | INTRAVENOUS | Status: DC
  Administered 2019-04-15: 0.125 ug/kg/min via INTRAVENOUS
  Filled 2019-04-15: qty 100

## 2019-04-15 MED ORDER — NOREPINEPHRINE 4 MG/250ML-% IV SOLN
0.0000 ug/min | INTRAVENOUS | Status: DC
  Administered 2019-04-15 (×2): 2 ug/min via INTRAVENOUS
  Administered 2019-04-17 – 2019-04-18 (×2): 5 ug/min via INTRAVENOUS
  Administered 2019-04-18 – 2019-04-19 (×2): 12 ug/min via INTRAVENOUS
  Administered 2019-04-19: 14 ug/min via INTRAVENOUS
  Administered 2019-04-19: 8 ug/min via INTRAVENOUS
  Administered 2019-04-20: 17:00:00 9.04 ug/min via INTRAVENOUS
  Administered 2019-04-20: 11 ug/min via INTRAVENOUS
  Administered 2019-04-20: 8 ug/min via INTRAVENOUS
  Administered 2019-04-21: 9 ug/min via INTRAVENOUS
  Administered 2019-04-21 – 2019-04-22 (×2): 4 ug/min via INTRAVENOUS
  Filled 2019-04-15 (×13): qty 250

## 2019-04-15 MED ORDER — METOPROLOL TARTRATE 5 MG/5ML IV SOLN
INTRAVENOUS | Status: AC
  Filled 2019-04-15: qty 5

## 2019-04-15 MED ORDER — FUROSEMIDE 10 MG/ML IJ SOLN
80.0000 mg | Freq: Two times a day (BID) | INTRAMUSCULAR | Status: DC
  Administered 2019-04-15: 80 mg via INTRAVENOUS
  Filled 2019-04-15: qty 8

## 2019-04-15 MED ORDER — SODIUM CHLORIDE 0.9% FLUSH
3.0000 mL | Freq: Two times a day (BID) | INTRAVENOUS | Status: DC
  Administered 2019-04-15 – 2019-04-20 (×5): 3 mL via INTRAVENOUS

## 2019-04-15 MED ORDER — SODIUM CHLORIDE 0.9% FLUSH
3.0000 mL | Freq: Two times a day (BID) | INTRAVENOUS | Status: DC
  Administered 2019-04-15: 3 mL via INTRAVENOUS

## 2019-04-15 MED ORDER — AMIODARONE HCL IN DEXTROSE 360-4.14 MG/200ML-% IV SOLN
60.0000 mg/h | INTRAVENOUS | Status: DC
  Administered 2019-04-15 (×2): 60 mg/h via INTRAVENOUS
  Filled 2019-04-15: qty 200

## 2019-04-15 SURGICAL SUPPLY — 14 items
CATH BALLN WEDGE 5F 110CM (CATHETERS) ×1 IMPLANT
CATH INFINITI 5FR MULTPACK ANG (CATHETERS) ×2 IMPLANT
CLOSURE MYNX CONTROL 5F (Vascular Products) ×1 IMPLANT
GLIDESHEATH SLEND SS 6F .021 (SHEATH) ×1 IMPLANT
GUIDEWIRE .025 260CM (WIRE) ×1 IMPLANT
GUIDEWIRE INQWIRE 1.5J.035X260 (WIRE) ×1 IMPLANT
INQWIRE 1.5J .035X260CM (WIRE) ×2
KIT HEART LEFT (KITS) ×2 IMPLANT
PACK CARDIAC CATHETERIZATION (CUSTOM PROCEDURE TRAY) ×2 IMPLANT
SHEATH GLIDE SLENDER 4/5FR (SHEATH) ×1 IMPLANT
SHEATH PINNACLE 5F 10CM (SHEATH) ×1 IMPLANT
SHEATH PROBE COVER 6X72 (BAG) ×1 IMPLANT
TRANSDUCER W/STOPCOCK (MISCELLANEOUS) ×2 IMPLANT
TUBING CIL FLEX 10 FLL-RA (TUBING) ×2 IMPLANT

## 2019-04-15 NOTE — Progress Notes (Signed)
Peripherally Inserted Central Catheter Placement  The IV Nurse has discussed with the patient and/or persons authorized to consent for the patient, the purpose of this procedure and the potential benefits and risks involved with this procedure.  The benefits include less needle sticks, lab draws from the catheter, and the patient may be discharged home with the catheter. Risks include, but not limited to, infection, bleeding, blood clot (thrombus formation), and puncture of an artery; nerve damage and irregular heartbeat and possibility to perform a PICC exchange if needed/ordered by physician.  Alternatives to this procedure were also discussed.  Bard Power PICC patient education guide, fact sheet on infection prevention and patient information card has been provided to patient /or left at bedside.  Telephone consent obtained at bedside from wife due to patient receiving sedation with cath procedure.  Patient was having ectopy prior to PICC insertion.  PICC Placement Documentation  PICC Double Lumen 03/25/2019 PICC Right Cephalic 37 cm 0 cm (Active)  Indication for Insertion or Continuance of Line Vasoactive infusions 03/22/2019 2106  Exposed Catheter (cm) 0 cm 03/25/2019 2106  Site Assessment Clean;Dry;Intact 04/10/2019 2106  Lumen #1 Status Flushed;Saline locked;Blood return noted 04/10/2019 2106  Lumen #2 Status Flushed;Saline locked;Blood return noted 03/23/2019 2106  Dressing Type Transparent 03/19/2019 2106  Dressing Status Clean;Dry;Intact 04/12/2019 2106  Dressing Intervention New dressing 03/25/2019 2106  Dressing Change Due 04/28/2019 03/28/2019 2106       Memori Sammon, Nicolette Bang 04/13/2019, 9:07 PM

## 2019-04-15 NOTE — Progress Notes (Signed)
Overnight Cardiology Cross Cover  Called to bedside by RN: Frequent PVCs on telemetry, short runs of VT. Patient denies any chest pain. No shortness of breath  VS: 107/77 (88) mmHg, HR 101 bpm, RR 19 Exam:  Mentation: Alert and oriented x 3 Lungs: clear to auscultation Cardiac: irregular, tachycardiac  Stat K = 4.0 12 lead ECG shows accelerated junctional rhythm (rate 101 bpm), ST depression in anterolateral leads.   Plan: -Complete magnesium supplementation -Continue Levophed -Amiodarone bolus + infusion -Discontinue digoxin  Melina Schools, MD

## 2019-04-15 NOTE — Progress Notes (Signed)
Cardiology Progress Note  Patient ID: Brendan [name] MRN: [id number] DOB: 1955/10/19 Date of Encounter: 03/24/2019  Primary Cardiologist: No primary care provider on file.  Subjective  Transferred from [place].  Elevated troponins and newly diagnosed systolic heart failure.  Given poorly controlled diabetes concerns for three-vessel CAD.  Denies chest pain, shortness of breath, palpitations today.  ROS:  All other ROS reviewed and negative. Pertinent positives noted in the HPI.     Inpatient Medications  Scheduled Meds: . aspirin EC  81 mg Oral Daily  . insulin aspart  0-15 Units Subcutaneous TID WC  . insulin aspart  4 Units Subcutaneous TID WC  . insulin glargine  20 Units Subcutaneous QHS  . rosuvastatin  40 mg Oral q1800  . sodium chloride flush  3 mL Intravenous Q12H   Continuous Infusions: . sodium chloride    . sodium chloride 50 mL/hr at 03/19/2019 0535  . heparin 1,450 Units/hr (04/13/2019 2327)   PRN Meds: sodium chloride, nitroGLYCERIN, ondansetron (ZOFRAN) IV, sodium chloride flush, traMADol   Vital Signs   Vitals:   04/10/2019 2123 03/29/2019 0519  BP: 96/64 90/65  Pulse:  80  Temp: 98 F (36.7 C) 98 F (36.7 C)  TempSrc: Oral Oral  SpO2:  95%  Weight: 68.2 kg 67.5 kg  Height: 5\' 11"  (1.803 m)     Intake/Output Summary (Last 24 hours) at 04/03/2019 0747 Last data filed at 03/22/2019 0522 Gross per 24 hour  Intake 120 ml  Output 400 ml  Net -280 ml   Last 3 Weights 03/18/2019 03/22/2019 01/14/2019  Weight (lbs) 148 lb 14.4 oz 150 lb 4.8 oz 134 lb 12.8 oz  Weight (kg) 67.541 kg 68.176 kg 61.145 kg      Telemetry  Overnight telemetry shows normal sinus rhythm with frequent PVCs, which I personally reviewed.   ECG  The most recent ECG shows normal sinus rhythm, heart rate 92, brief nonsustained ventricular tachycardia of 3 beat duration, diffuse ST depressions noted anterior lateral leads, which I personally reviewed.   Physical Exam   Vitals:   04/12/2019 2123 03/24/2019 0519  BP: 96/64 90/65  Pulse:  80  Temp: 98 F (36.7 C) 98 F (36.7 C)  TempSrc: Oral Oral  SpO2:  95%  Weight: 68.2 kg 67.5 kg  Height: 5\' 11"  (1.803 m)      Intake/Output Summary (Last 24 hours) at 03/26/2019 0747 Last data filed at 04/13/2019 0522 Gross per 24 hour  Intake 120 ml  Output 400 ml  Net -280 ml    Last 3 Weights 04/03/2019 03/28/2019 01/14/2019  Weight (lbs) 148 lb 14.4 oz 150 lb 4.8 oz 134 lb 12.8 oz  Weight (kg) 67.541 kg 68.176 kg 61.145 kg    Body mass index is 20.77 kg/m.   General: Well nourished, well developed, in no acute distress Head: Atraumatic, normal size  Eyes: PEERLA, EOMI  Neck: Supple, JVD noted 7 to 10 cm of water Endocrine: No thryomegaly Cardiac: Normal S1, S2; RRR; no murmurs, rubs, or gallops Lungs: Crackles at lung bases Abd: Soft, nontender, no hepatomegaly  Ext: No edema, pulses 2+ Musculoskeletal: No deformities, BUE and BLE strength normal and equal Skin: Warm and dry, no rashes   Neuro: Alert and oriented to person, place, time, and situation, CNII-XII grossly intact, no focal deficits  Psych: Normal mood and affect   Labs  High Sensitivity Troponin:   Recent Labs  Lab 04/09/2019 2149 04/03/2019 2329  TROPONINIHS 8,431* 2,703*  Cardiac EnzymesNo results for input(s): TROPONINI in the last 168 hours. No results for input(s): TROPIPOC in the last 168 hours.  Chemistry Recent Labs  Lab 04/06/2019 2149 04/11/2019 0113  NA 133* 134*  K 4.0 4.4  CL 102 103  CO2 22 21*  GLUCOSE 219* 103*  BUN 51* 51*  CREATININE 1.28* 1.26*  CALCIUM 8.0* 8.0*  PROT 5.8*  --   ALBUMIN 2.7*  --   AST 24  --   ALT 22  --   ALKPHOS 89  --   BILITOT 0.8  --   GFRNONAA >60 >60  GFRAA >60 >60  ANIONGAP 9 10    Hematology Recent Labs  Lab 04/10/2019 0113  WBC 8.1  RBC 3.42*  HGB 9.8*  HCT 29.7*  MCV 86.8  MCH 28.7  MCHC 33.0  RDW 12.2  PLT 261   BNPNo results for input(s): BNP, PROBNP in the last 168 hours.   DDimer No results for input(s): DDIMER in the last 168 hours.   Radiology  DG Chest 2 View  Result Date: 03/25/2019 CLINICAL DATA:  Pneumonia EXAM: CHEST - 2 VIEW COMPARISON:  04/12/2019 FINDINGS: Cardiac shadow is stable. Lungs are well aerated bilaterally. Diffuse airspace opacities are again seen bilaterally stable from the prior exam. Small effusions are seen as well. Degenerative changes of the thoracic spine are noted. IMPRESSION: Stable airspace opacities bilaterally with effusions. Electronically Signed   By: Inez Catalina M.D.   On: 04/05/2019 22:22    Cardiac Studies   TTE: (04/12/19) 1.  Overall left ventricular systolic function is severely impaired with an EF between 25-30%. 2.  Pseudonormal LV filling pattern, consistent with elevated LA pressure. 3.  Basal lateral, basal inferior, mid lateral, inferior, apical lateral, and apical inferior LV wall motion is akinetic. 4.  Moderate multi jet mitral regurgitation is present. 5.  Mild to moderate tricuspid regurgitation present. 6.  The right ventricular systolic pressure, as measured by Doppler, is 54 mmHg.  Patient Profile  Brendan [name] is a 64 y.o. male with uncontrolled diabetes, hypertension who was admitted to [facility] on 04/11/2019 with shortness of breath and concerns for pneumonia.  Subsequently found to have severely reduced LV function with regional wall motion abnormalities and non-STEMI.  Overall picture more consistent with indolent heart failure than pneumonia.  Assessment & Plan   1.  High risk non-STEMI with severely reduced EF, 25% with RWA -Admitted to [facility] with several days of diuresis.  There was concerns for pneumonia.  He does not have pneumonia.  Procalcitonin negative. This is all congestive heart failure.  He has poorly controlled diabetes with A1c 9.0.  I suspect he had his event several weeks ago and this is just progressed further. -Continue aspirin and heparin drip -Blood pressures are  marginal.  We will hold on beta-blocker for now.  We will proceed with right and left heart cath today.  I am a bit concerned about his soft blood pressures that he may be low output state.  If he has severe three-vessel disease CAD as I suspect he likely will need support.  We will await heart cath today. -No further diuresis.  He does appear to be fairly euvolemic on examination.  I suspect he will be needing further diuresis but I will hold until angiography is performed.  We will also get a better idea of volume status on right heart cath. -Continue high intensity statin -We will repeat echocardiogram here.  I cannot see the  images from [place].  2.  New onset systolic heart failure, ejection fraction 25% -Likely ischemic adenopathy.  Left heart cath and right heart cath as above.  No beta-blockers given narrow pulse pressure and soft blood pressures today. -I will hold diuresis.  Probably still is a bit volume up but we will get heart cath today to confirm volume status as well as confirm coronary anatomy.  3.  Nonsustained ventricular tachycardia -Also another ominous sign for severe three-vessel disease and ischemic cardiomyopathy.  No antiarrhythmics for now. -Unfortunately having to hold on beta-blocker for now.  Given soft blood pressures we will get cath done first.  4.  Uncontrolled diabetes -A1c 9.0 -Sliding scale insulin while in house -We will need diabetes education while here  FEN -No intravenous fluids -DVT PPx: Heparin drip -Diet: N.p.o. for left heart cath -Code: Full  For questions or updates, please contact [place] Please consult [email or web address] for contact info under   Time Spent with Patient: I have spent a total of 35 minutes with patient reviewing hospital notes, telemetry, EKGs, labs and examining the patient as well as establishing an assessment and plan that was discussed with the patient.  > 50% of time was spent in direct patient care.    Signed,  Addison Naegeli. Audie Box, [place]  03/27/2019 7:47 AM

## 2019-04-15 NOTE — Consult Note (Addendum)
Advanced Heart Failure Team Consult Note   Primary Physician: [name], MD PCP-Cardiologist:  No primary care provider on file.  Reason for Consultation: Acute Systolic Heart [name]   HPI:    Brendan [name] is seen today for evaluation of Acute Systolic Heart Failure at the request of Dr Marisue Ivan.   Brendan [name] is a 64 year old with a history of uncontrolled diabetes, neuropathy, smoker,  and HTN. Presented to [facility] ED with increased shortness of breath. Troponin elevated and ECHO showed reduced EF. Last night he was transferred to [facility] for cardiac work up for Acute Systolic HF.   Pertinent admission labs included: creatinine 1.3, K 4, Hgb A1C 9, hgb 9.8 , and HS Trop [id number]. Had cath today that showed multivessel CAD.  LHC/RHC 04/11/2019   Ost LM to Mid LM lesion is 35% stenosed.  Prox LAD lesion is 90% stenosed.  Prox LAD to Mid LAD lesion is 50% stenosed.  Mid Cx lesion is 100% stenosed.  Prox RCA to Mid RCA lesion is 90% stenosed.  RPDA lesion is 95% stenosed.  LV end diastolic pressure is severely elevated.  Hemodynamic findings consistent with moderate pulmonary hypertension.  RA 18, PA 58/23 (34) , PCWP 34, Fick CO 4.3, and Fick CI 2.3    1. Severe 3 vessel obstructive CAD 2. High LV filling pressures 3. Reduced cardiac output with index 2.3 4. Moderate pulmonary HTN . Echo 03/20/2019 LVEF 30-35%, RV normal, Mod-Severe Brendan  Review of Systems: [y] = yes, [ ]  = no   . General: Weight gain [ ] ; Weight loss [ ] ; Anorexia [ ] ; Fatigue [Y ]; Fever [ ] ; Chills [ ] ; Weakness [Y ]  . Cardiac: Chest pain/pressure [ ] ; Resting SOB [ ] ; Exertional SOB [Y ]; Orthopnea [ ] ; Pedal Edema [Y ]; Palpitations [ ] ; Syncope [ ] ; Presyncope [ ] ; Paroxysmal nocturnal dyspnea[ ]   . Pulmonary: Cough [ ] ; Wheezing[ ] ; Hemoptysis[ ] ; Sputum [ ] ; Snoring [ ]   . GI: Vomiting[ ] ; Dysphagia[ ] ; Melena[ ] ; Hematochezia [ ] ; Heartburn[ ] ; Abdominal pain [ ] ; Constipation [ ] ;  Diarrhea [ ] ; BRBPR [ ]   . GU: Hematuria[ ] ; Dysuria [ ] ; Nocturia[ ]   . Vascular: Pain in legs with walking [ ] ; Pain in feet with lying flat [ ] ; Non-healing sores [ ] ; Stroke [ ] ; TIA [ ] ; Slurred speech [ ] ;  . Neuro: Headaches[ ] ; Vertigo[ ] ; Seizures[ ] ; Paresthesias[ ] ;Blurred vision [ ] ; Diplopia [ ] ; Vision changes [ ]   . Ortho/Skin: Arthritis Jazmín.Cullens ]; Joint pain [ Y]; Muscle pain [ ] ; Joint swelling [ ] ; Back Pain [ ] ; Rash [ ]   . Psych: Depression[ ] ; Anxiety[ ]   . Heme: Bleeding problems [ ] ; Clotting disorders [ ] ; Anemia [ ]   . Endocrine: Diabetes [ Y]; Thyroid dysfunction[ ]   Home Medications Prior to Admission medications   Medication Sig Start Date End Date Taking? Authorizing Provider  Insulin Glargine (BASAGLAR KWIKPEN) 100 UNIT/ML Inject 28 Units into the skin at bedtime.   Yes [provider]  insulin lispro (HUMALOG) 100 UNIT/ML injection Inject 8 Units into the skin 2 (two) times daily before a meal.   Yes [provider]  metFORMIN (GLUCOPHAGE) 500 MG tablet Take 1 tablet (500 mg total) by mouth 2 (two) times daily with a meal. 06/07/17  Yes Hensel, Jamal Collin, MD  glucose blood (RELION GLUCOSE TEST STRIPS) test strip Use as instructed 02/24/19   Winfrey,  Alcario Drought, MD  Insulin Pen Needle (PEN NEEDLES) 32G X 4 MM MISC     [provider]  ReliOn Ultra Thin Lancets MISC Use to test blood sugar up to 4 times daily. 07/28/13   Coral Spikes, DO    Past Medical History: Past Medical History:  Diagnosis Date  . Cholecystitis 03/2013  . Complication of anesthesia   . Diabetes mellitus without complication ([place])   . Erectile dysfunction 2010  . HTN (hypertension)   . NSTEMI (non-ST elevated myocardial infarction) ([place]) 03/17/2019  . PONV (postoperative nausea and vomiting)     Past Surgical History: Past Surgical History:  Procedure Laterality Date  . APPENDECTOMY  1969  . CHOLECYSTECTOMY    . CHOLECYSTECTOMY N/A 04/02/2013   Procedure:  LAPAROSCOPIC CHOLECYSTECTOMY WITH  INTRAOPERATIVE CHOLANGIOGRAM;  Surgeon: Joyice Faster. Cornett, MD;  Location: [facility];  Service: General;  Laterality: N/A;  . ERCP N/A 04/02/2013   Procedure: ENDOSCOPIC RETROGRADE CHOLANGIOPANCREATOGRAPHY (ERCP);  Surgeon: Inda Castle, MD;  Location: [facility];  Service: Endoscopy;  Laterality: N/A;  . 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     Family History: Family History  Problem Relation Age of Onset  . Colon cancer Maternal Aunt   . Diabetes Maternal Grandmother   . Stroke Maternal Grandmother     Social History: Social History   Socioeconomic History  . Marital status: Married    Spouse name: Not on file  . Number of children: Not on file  . Years of education: Not on file  . Highest education level: Not on file  Occupational History  . Not on file  Tobacco Use  . Smoking status: Never Smoker  . Smokeless tobacco: Current User    Types: Snuff, Chew  Substance and Sexual Activity  . Alcohol use: Yes    Comment: occ  . Drug use: No  . Sexual activity: Not on file  Other Topics Concern  . Not on file  Social History Narrative  . Not on file   Social Determinants of Health   Financial Resource Strain:   . Difficulty of Paying Living Expenses:   Food Insecurity:   . Worried About Charity fundraiser in the Last Year:   . Arboriculturist in the Last Year:   Transportation Needs:   . Film/video editor (Medical):   Marland Kitchen Lack of Transportation (Non-Medical):   Physical Activity:   . Days of Exercise per Week:   . Minutes of Exercise per Session:   Stress:   . Feeling of Stress :   Social Connections:   . Frequency of Communication with Friends and Family:   . Frequency of Social Gatherings with Friends and Family:   . Attends Religious Services:   . Active Member of Clubs or Organizations:   . Attends Archivist Meetings:   Marland Kitchen Marital Status:     Allergies:    Allergies  Allergen Reactions  . Acetaminophen Itching    Objective:    Vital Signs:   Temp:  [97.5 F (36.4 C)-98.2 F (36.8 C)] 98.1 F (36.7 C) (03/30 1138) Pulse Rate:  [76-90] 81 (03/30 1630) Resp:  [10-26] 16 (03/30 1630) BP: (90-122)/(57-85) 97/57 (03/30 1630) SpO2:  [90 %-100 %] 94 % (03/30 1630) FiO2 (%):  [98 %] 98 % (03/30 1531) Weight:  [67.5 kg-68.2 kg] 67.5 kg (03/30 0519) Last BM Date: 03/22/2019  Weight change: [employer]  04/07/2019 2123 04/10/2019 0519  Weight: 68.2 kg 67.5 kg    Intake/Output:   Intake/Output Summary (Last 24 hours) at 03/28/2019 1759 Last data filed at 04/11/2019 1225 Gross per 24 hour  Intake 120 ml  Output 475 ml  Net -355 ml      Physical Exam    General:  No resp difficulty HEENT: normal Neck: supple. JVP 11-12. Carotids 2+ bilat; no bruits. No lymphadenopathy or thyromegaly appreciated. Cor: PMI nondisplaced. Regular rate & rhythm. No rubs, gallops or murmurs. Lungs: clear Abdomen: soft, nontender, nondistended. No hepatosplenomegaly. No bruits or masses. Good bowel sounds. Extremities: no cyanosis, clubbing, rash, edema Neuro: alert & orientedx3, cranial nerves grossly intact. moves all 4 extremities w/o difficulty. Affect pleasant   Telemetry   SR 70-80s   EKG  SR NSVT 91 bpm   Labs   Basic Metabolic Panel: Recent Labs  Lab 04/02/2019 2149 04/02/2019 0113 04/16/2019 1501 03/18/2019 1505  NA 133* 134* 137 138  K 4.0 4.4 3.6 3.5  CL 102 103  --   --   CO2 22 21*  --   --   GLUCOSE 219* 103*  --   --   BUN 51* 51*  --   --   CREATININE 1.28* 1.26*  --   --   CALCIUM 8.0* 8.0*  --   --   MG 2.5* 2.6*  --   --     Liver Function Tests: Recent Labs  Lab 04/06/2019 2149  AST 24  ALT 22  ALKPHOS 89  BILITOT 0.8  PROT 5.8*  ALBUMIN 2.7*   No results for input(s): LIPASE, AMYLASE in the last 168 hours. No results for input(s): AMMONIA in the last 168 hours.  CBC: Recent Labs  Lab 04/09/2019 0113  04/10/2019 1501 03/29/2019 1505  WBC 8.1  --   --   HGB 9.8* 9.5* 9.2*  HCT 29.7* 28.0* 27.0*  MCV 86.8  --   --   PLT 261  --   --     Cardiac Enzymes: No results for input(s): CKTOTAL, CKMB, CKMBINDEX, TROPONINI in the last 168 hours.  BNP: BNP (last 3 results) Recent Labs    04/08/2019 0113  BNP 655.7*    ProBNP (last 3 results) No results for input(s): PROBNP in the last 8760 hours.   CBG: Recent Labs  Lab 03/21/2019 2231 03/19/2019 0735 04/12/2019 1134  GLUCAP 185* 121* 95    Coagulation Studies: Recent Labs    03/20/2019 2149  LABPROT 15.6*  INR 1.3*     Imaging   DG Chest 2 View  Result Date: 04/11/2019 CLINICAL DATA:  Pneumonia EXAM: CHEST - 2 VIEW COMPARISON:  03/23/2019 FINDINGS: Cardiac shadow is stable. Lungs are well aerated bilaterally. Diffuse airspace opacities are again seen bilaterally stable from the prior exam. Small effusions are seen as well. Degenerative changes of the thoracic spine are noted. IMPRESSION: Stable airspace opacities bilaterally with effusions. Electronically Signed   By: Inez Catalina M.D.   On: 03/30/2019 22:22   CARDIAC CATHETERIZATION  Result Date: 04/05/2019  Ost LM to Mid LM lesion is 35% stenosed.  Prox LAD lesion is 90% stenosed.  Prox LAD to Mid LAD lesion is 50% stenosed.  Mid Cx lesion is 100% stenosed.  Prox RCA to Mid RCA lesion is 90% stenosed.  RPDA lesion is 95% stenosed.  LV end diastolic pressure is severely elevated.  Hemodynamic findings consistent with moderate pulmonary hypertension.  1. Severe 3 vessel obstructive CAD 2. High  LV filling pressures 3. Reduced cardiac output with index 2.3 4. Moderate pulmonary HTN. Plan: plan to transfer to the ICU and place PICC line. Initiate IV milrinone. Tune up CHF. Consider revascularization with CABG. The LAD and diagonal appear to be suitable targets and probably the distal RCA.   ECHOCARDIOGRAM COMPLETE  Result Date: 03/28/2019    ECHOCARDIOGRAM REPORT   Patient Name:    Brendan [name] Date of Exam: 03/18/2019 Medical Rec #:  [id number]        Height:       71.0 in Accession #:    [id number]       Weight:       148.9 lb Date of Birth:  1956/01/02        BSA:          1.860 m Patient Age:    32 years         BP:           103/76 mmHg Patient Gender: M                HR:           86 bpm. Exam Location:  Inpatient Procedure: 2D Echo Indications:    Acute Cornonary Syndrome I24.9  History:        Patient has no prior history of Echocardiogram examinations.                 NSTEMI; Risk Factors:Diabetes and Hypertension.  Sonographer:    Mikki Santee RDCS (AE) Referring Phys: [place]  1. Left ventricular ejection fraction, by estimation, is 30 to 35%. The left ventricle has moderately decreased function. The left ventricle demonstrates regional wall motion abnormalities (see scoring diagram/findings for description). There is mild left ventricular hypertrophy. Left ventricular diastolic function could not be evaluated. There is severe hypokinesis of the left ventricular, entire inferior wall, inferoseptal wall, apical segment and lateral wall.  2. Right ventricular systolic function is hyperdynamic. The right ventricular size is normal. There is moderately elevated pulmonary artery systolic pressure.  3. Decreased posterior leaflet motion due to ischemic tethering of the mitral valve leaflets.  4. The mitral valve is abnormal. Moderate to severe mitral valve regurgitation.  5. The aortic valve is tricuspid. Aortic valve regurgitation is not visualized. Mild aortic valve sclerosis is present, with no evidence of aortic valve stenosis.  6. The inferior vena cava is normal in size with greater than 50% respiratory variability, suggesting right atrial pressure of 3 mmHg. FINDINGS  Left Ventricle: Left ventricular ejection fraction, by estimation, is 30 to 35%. The left ventricle has moderately decreased function. The left ventricle demonstrates regional wall  motion abnormalities. Severe hypokinesis of the left ventricular, entire  inferior wall, inferoseptal wall, apical segment and lateral wall. The left ventricular internal cavity size was normal in size. There is mild left ventricular hypertrophy. Left ventricular diastolic function could not be evaluated due to atrial fibrillation. Left ventricular diastolic function could not be evaluated. Right Ventricle: The right ventricular size is normal. No increase in right ventricular wall thickness. Right ventricular systolic function is hyperdynamic. There is moderately elevated pulmonary artery systolic pressure. The tricuspid regurgitant velocity is 3.37 m/s, and with an assumed right atrial pressure of 3 mmHg, the estimated right ventricular systolic pressure is 38.2 mmHg. Left Atrium: Left atrial size was normal in size. Right Atrium: Right atrial size was normal in size. Pericardium: There is no evidence of pericardial effusion. Mitral Valve: The mitral  valve is abnormal. There is mild thickening of the mitral valve leaflet(s). Decreased posterior leaflet motion due to ischemic tethering of the mitral valve leaflets. Moderate to severe mitral valve regurgitation. Tricuspid Valve: The tricuspid valve is grossly normal. Tricuspid valve regurgitation is mild. Aortic Valve: The aortic valve is tricuspid. Aortic valve regurgitation is not visualized. Mild aortic valve sclerosis is present, with no evidence of aortic valve stenosis. Pulmonic Valve: The pulmonic valve was normal in structure. Pulmonic valve regurgitation is not visualized. Aorta: The aortic root and ascending aorta are structurally normal, with no evidence of dilitation. Venous: The inferior vena cava is normal in size with greater than 50% respiratory variability, suggesting right atrial pressure of 3 mmHg. IAS/Shunts: The interatrial septum appears to be lipomatous. No atrial level shunt detected by color flow Doppler.  LEFT VENTRICLE PLAX 2D LVIDd:          5.70 cm      Diastology LVIDs:         5.00 cm      LV e' lateral:   4.81 cm/s LV PW:         1.10 cm      LV E/e' lateral: 21.3 LV IVS:        0.90 cm      LV e' medial:    5.98 cm/s LVOT diam:     2.40 cm      LV E/e' medial:  17.1 LV SV:         48 LV SV Index:   26 LVOT Area:     4.52 cm  LV Volumes (MOD) LV vol d, MOD A2C: 96.6 ml LV vol d, MOD A4C: 126.0 ml LV vol s, MOD A2C: 62.4 ml LV vol s, MOD A4C: 87.8 ml LV SV MOD A2C:     34.2 ml LV SV MOD A4C:     126.0 ml LV SV MOD BP:      34.9 ml RIGHT VENTRICLE RV S prime:     11.90 cm/s TAPSE (M-mode): 1.6 cm LEFT ATRIUM           Index       RIGHT ATRIUM           Index LA diam:      3.90 cm 2.10 cm/m  RA Area:     11.50 cm LA Vol (A2C): 62.2 ml 33.44 ml/m RA Volume:   27.70 ml  14.89 ml/m LA Vol (A4C): 57.8 ml 31.07 ml/m  AORTIC VALVE LVOT Vmax:   65.00 cm/s LVOT Vmean:  42.700 cm/s LVOT VTI:    0.107 m  AORTA Ao Root diam: 3.30 cm MITRAL VALVE                TRICUSPID VALVE MV Area (PHT): 2.37 cm     TR Peak grad:   45.4 mmHg MV Decel Time: 320 msec     TR Vmax:        337.00 cm/s Brendan Peak grad: 48.2 mmHg Brendan Mean grad: 33.0 mmHg     SHUNTS Brendan Vmax:      347.00 cm/s   Systemic VTI:  0.11 m Brendan Vmean:     273.0 cm/s    Systemic Diam: 2.40 cm MV E velocity: 102.50 cm/s MV A velocity: 35.00 cm/s MV E/A ratio:  2.93 Lyman Bishop MD Electronically signed by Lyman Bishop MD Signature Date/Time: 03/28/2019/12:51:12 PM    Final    [place] EKG SITE RITE  Result Date: 03/31/2019 If Site  Rite image not attached, placement could not be confirmed due to current cardiac rhythm.    Medications:     Current Medications: . aspirin EC  81 mg Oral Daily  . [START ON 04/16/2019] feeding supplement (GLUCERNA SHAKE)  237 mL Oral TID WC  . insulin aspart  0-15 Units Subcutaneous TID WC  . insulin aspart  4 Units Subcutaneous TID WC  . insulin glargine  20 Units Subcutaneous QHS  . multivitamin with minerals  1 tablet Oral Daily  . rosuvastatin  40 mg Oral q1800  .  sodium chloride flush  3 mL Intravenous Q12H    Infusions: . sodium chloride    . heparin         Assessment/Plan   1. Acute Systolic HF, ICM  -ECHO completed today with EF 30-35%, RV normal, and mod-severe Brendan.  - Elevated filling pressures on cath.  - Start IV lasix + milrinone.  - Place PICC to place CVP and CO-OX   2. CAD -LHC with severe 3 vessel CAD  - On statin - CT surgery consulted.   3. Mitral Regurgitation - Mod-severe on ECHO today.  -Hopefully will improve with diuresis.   4. Uncontrolled DM -Hgb A1C 9.  - On sliding scale.   5. Anemia  -Hgb 9.8.   -Check [employer].   Length of Stay: 1  Glori Bickers, MD  03/31/2019, 5:59 PM  Advanced Heart Failure Team Pager [phone] (M-F; [place])  Please contact [facility] for night-coverage after hours (4p -7a ) and weekends on [email or web address]  Agree with above.   64 y/o smoker, poorly controlled DM2 admitted with acute HF. EF ~30%. Cath today reviewed with Dr. Martinique showed severe 3v CAD with acceptable LAD target. Mod-severe Brendan. Filling pressures markedly elevated with CI 2.3  General: Lying in bed. No resp difficulty HEENT: normal Neck: supple. JVP to jaw  Carotids 2+ bilat; no bruits. No lymphadenopathy or thryomegaly appreciated. Cor: PMI nondisplaced. Regular rate & rhythm. 2/6 Brendan Lungs: decreased throughout Abdomen: soft, nontender, nondistended. No hepatosplenomegaly. No bruits or masses. Good bowel sounds. Extremities: no cyanosis, clubbing, rash, 1+ edema Neuro: alert & orientedx3, cranial nerves grossly intact. moves all 4 extremities w/o difficulty. Affect pleasant  He has severely decompensated CHF with 3v CAD and moderate to severe Brendan. We will move to ICU. Begin diuresis and adjustment of HF meds. Will likely need inotrope support. Once tuned up will need TCTS evaluation for CABG/MVR. Consult DM coordinator. Discussed personally with Drs. O'Neal and Martinique.   CRITICAL CARE Performed by:  Glori Bickers  Total critical care time: 35 minutes  Critical care time was exclusive of separately billable procedures and treating other patients.  Critical care was necessary to treat or prevent imminent or life-threatening deterioration.  Critical care was time spent personally by me (independent of midlevel providers or residents) on the following activities: development of treatment plan with patient and/or surrogate as well as nursing, discussions with consultants, evaluation of patient's response to treatment, examination of patient, obtaining history from patient or surrogate, ordering and performing treatments and interventions, ordering and review of laboratory studies, ordering and review of radiographic studies, pulse oximetry and re-evaluation of patient's condition.  Glori Bickers, MD  6:00 PM

## 2019-04-15 NOTE — Chronic Care Management (AMB) (Signed)
  Chronic Care Management   Note  04/16/2019 Name: Brendan [name] MRN: [id number] DOB: 1955/10/18   Pharmacy referral received from [facility] regarding patient f/u with medication to [facility].   Follow up plan: [place] follow up   [place], BSN, [facility] Care Management Coordinator [place] Phone: [phone] Fax: [phone]

## 2019-04-15 NOTE — Progress Notes (Signed)
ANTICOAGULATION CONSULT NOTE  Pharmacy Consult for Heparin Indication: chest pain/ACS  Heparin Dosing Weight: 68.2 kg  Labs: Recent Labs    03/20/2019 2149 03/29/2019 2212 03/19/2019 0113 03/27/2019 0427  HGB  --   --  9.8*  --   HCT  --   --  29.7*  --   PLT  --   --  261  --   APTT 65*  --   --   --   LABPROT 15.6*  --   --   --   INR 1.3*  --   --   --   HEPARINUNFRC  --  0.26*  --  0.56  CREATININE 1.28*  --  1.26*  --     Assessment: 12 yom presenting to [facility] with CP, transferred to [facility] for further evaluation. Patient was started on heparin IV on 3/26, none PTA.   Heparin level therapeutic this morning, H/H down slightly, no bleeding reported. Cath later today.  Goal of Therapy:  Heparin level 0.3-0.7 units/ml Monitor platelets by anticoagulation protocol: Yes   Plan:  -Continue heparin 1450 units/h -Daily heparin level and CBC   Arrie Senate, PharmD, BCPS Clinical Pharmacist [phone] Please check AMION for all [facility] Pharmacy numbers 03/27/2019

## 2019-04-15 NOTE — Interval H&P Note (Signed)
History and Physical Interval Note:  04/03/2019 2:12 PM  KAYO ZION  has presented today for surgery, with the diagnosis of unstable angin.  The various methods of treatment have been discussed with the patient and family. After consideration of risks, benefits and other options for treatment, the patient has consented to  Procedure(s): LEFT HEART CATH AND CORONARY ANGIOGRAPHY (N/A) as a surgical intervention.  The patient's history has been reviewed, patient examined, no change in status, stable for surgery.  I have reviewed the patient's chart and labs.  Questions were answered to the patient's satisfaction.   Cath Lab Visit (complete for each Cath Lab visit)  Clinical Evaluation Leading to the Procedure:   ACS: Yes.    Non-ACS:    Anginal Classification: CCS III  Anti-ischemic medical therapy: Maximal Therapy (2 or more classes of medications)  Non-Invasive Test Results: High-risk stress test findings: cardiac mortality >3%/year  Prior CABG: No previous CABG        Collier Salina [facility] 04/12/2019 2:12 PM

## 2019-04-15 NOTE — Progress Notes (Signed)
Patient having nonsustained runs of VT and frequent PVC/PACs. Obtained EKG-critical SR w/ frequent PAC/PVCs. Patient denies chest pain, shortness of breath and palpitations. Paged Cardiology, awaiting for orders. Will continue to monitor

## 2019-04-15 NOTE — Progress Notes (Signed)
ANTICOAGULATION CONSULT NOTE  Pharmacy Consult for Heparin Indication: chest pain/ACS  Heparin Dosing Weight: 68.2 kg  Labs: Recent Labs    04/04/2019 2149 04/07/2019 2212 04/09/2019 0113 03/30/2019 0427  HGB  --   --  9.8*  --   HCT  --   --  29.7*  --   PLT  --   --  261  --   APTT 65*  --   --   --   LABPROT 15.6*  --   --   --   INR 1.3*  --   --   --   HEPARINUNFRC  --  0.26*  --  0.56  CREATININE 1.28*  --  1.26*  --     Assessment: 72 yom presenting to [facility] with CP, transferred to [facility] for further evaluation. Patient was started on heparin IV on 3/26, no anticoagulation PTA.   Patient now s/p cath 3/30 showing severe 3-vessel obstructive CAD, high LV filling pressures, reduced cardiac output, and moderate pulm HTN. Plan is to transfer to ICU and place PICC for IV milrinone, considering CABG. Pharmacy consulted to resume heparin 8 hours post-sheath removal (removed at 1510 per cath procedure log). Heparin level therapeutic this morning on 1450 units/hr. H/H down slightly, plt wnl. No bleeding issues reported.  Goal of Therapy:  Heparin level 0.3-0.7 units/ml Monitor platelets by anticoagulation protocol: Yes   Plan:  Resume heparin at previous rate 1450 units/h at 2315 (8 hours post-sheath removal) 6h heparin level from resumption Monitor daily heparin level and CBC, s/sx bleeding F/u Cardiology plans   Arturo Morton, PharmD, BCPS Please check AMION for all [place] contact numbers Clinical Pharmacist 04/01/2019 3:20 PM

## 2019-04-15 NOTE — Progress Notes (Signed)
  Echocardiogram 2D Echocardiogram has been performed.  [name] 03/28/2019, 12:23 PM

## 2019-04-15 NOTE — H&P (View-Only) (Signed)
Cardiology Progress Note  Patient ID: Brendan [name] MRN: [id number] DOB: 09/12/1955 Date of Encounter: 04/04/2019  Primary Cardiologist: No primary care provider on file.  Subjective  Transferred from [place].  Elevated troponins and newly diagnosed systolic heart failure.  Given poorly controlled diabetes concerns for three-vessel CAD.  Denies chest pain, shortness of breath, palpitations today.  ROS:  All other ROS reviewed and negative. Pertinent positives noted in the HPI.     Inpatient Medications  Scheduled Meds: . aspirin EC  81 mg Oral Daily  . insulin aspart  0-15 Units Subcutaneous TID WC  . insulin aspart  4 Units Subcutaneous TID WC  . insulin glargine  20 Units Subcutaneous QHS  . rosuvastatin  40 mg Oral q1800  . sodium chloride flush  3 mL Intravenous Q12H   Continuous Infusions: . sodium chloride    . sodium chloride 50 mL/hr at 03/20/2019 0535  . heparin 1,450 Units/hr (04/04/2019 2327)   PRN Meds: sodium chloride, nitroGLYCERIN, ondansetron (ZOFRAN) IV, sodium chloride flush, traMADol   Vital Signs   Vitals:   03/26/2019 2123 04/07/2019 0519  BP: 96/64 90/65  Pulse:  80  Temp: 98 F (36.7 C) 98 F (36.7 C)  TempSrc: Oral Oral  SpO2:  95%  Weight: 68.2 kg 67.5 kg  Height: 5\' 11"  (1.761 m)     Intake/Output Summary (Last 24 hours) at 03/24/2019 0747 Last data filed at 04/16/2019 0522 Gross per 24 hour  Intake 120 ml  Output 400 ml  Net -280 ml   Last 3 Weights 03/30/2019 04/01/2019 01/14/2019  Weight (lbs) 148 lb 14.4 oz 150 lb 4.8 oz 134 lb 12.8 oz  Weight (kg) 67.541 kg 68.176 kg 61.145 kg      Telemetry  Overnight telemetry shows normal sinus rhythm with frequent PVCs, which I personally reviewed.   ECG  The most recent ECG shows normal sinus rhythm, heart rate 92, brief nonsustained ventricular tachycardia of 3 beat duration, diffuse ST depressions noted anterior lateral leads, which I personally reviewed.   Physical Exam   Vitals:   04/05/2019 2123 04/05/2019 0519  BP: 96/64 90/65  Pulse:  80  Temp: 98 F (36.7 C) 98 F (36.7 C)  TempSrc: Oral Oral  SpO2:  95%  Weight: 68.2 kg 67.5 kg  Height: 5\' 11"  (1.803 m)      Intake/Output Summary (Last 24 hours) at 04/07/2019 0747 Last data filed at 03/26/2019 0522 Gross per 24 hour  Intake 120 ml  Output 400 ml  Net -280 ml    Last 3 Weights 03/28/2019 03/24/2019 01/14/2019  Weight (lbs) 148 lb 14.4 oz 150 lb 4.8 oz 134 lb 12.8 oz  Weight (kg) 67.541 kg 68.176 kg 61.145 kg    Body mass index is 20.77 kg/m.   General: Well nourished, well developed, in no acute distress Head: Atraumatic, normal size  Eyes: PEERLA, EOMI  Neck: Supple, JVD noted 7 to 10 cm of water Endocrine: No thryomegaly Cardiac: Normal S1, S2; RRR; no murmurs, rubs, or gallops Lungs: Crackles at lung bases Abd: Soft, nontender, no hepatomegaly  Ext: No edema, pulses 2+ Musculoskeletal: No deformities, BUE and BLE strength normal and equal Skin: Warm and dry, no rashes   Neuro: Alert and oriented to person, place, time, and situation, CNII-XII grossly intact, no focal deficits  Psych: Normal mood and affect   Labs  High Sensitivity Troponin:   Recent Labs  Lab 04/12/2019 2149 03/22/2019 2329  TROPONINIHS 8,431* 6,073*  Cardiac EnzymesNo results for input(s): TROPONINI in the last 168 hours. No results for input(s): TROPIPOC in the last 168 hours.  Chemistry Recent Labs  Lab 04/09/2019 2149 03/24/2019 0113  NA 133* 134*  K 4.0 4.4  CL 102 103  CO2 22 21*  GLUCOSE 219* 103*  BUN 51* 51*  CREATININE 1.28* 1.26*  CALCIUM 8.0* 8.0*  PROT 5.8*  --   ALBUMIN 2.7*  --   AST 24  --   ALT 22  --   ALKPHOS 89  --   BILITOT 0.8  --   GFRNONAA >60 >60  GFRAA >60 >60  ANIONGAP 9 10    Hematology Recent Labs  Lab 04/04/2019 0113  WBC 8.1  RBC 3.42*  HGB 9.8*  HCT 29.7*  MCV 86.8  MCH 28.7  MCHC 33.0  RDW 12.2  PLT 261   BNPNo results for input(s): BNP, PROBNP in the last 168 hours.    DDimer No results for input(s): DDIMER in the last 168 hours.   Radiology  DG Chest 2 View  Result Date: 03/18/2019 CLINICAL DATA:  Pneumonia EXAM: CHEST - 2 VIEW COMPARISON:  04/08/2019 FINDINGS: Cardiac shadow is stable. Lungs are well aerated bilaterally. Diffuse airspace opacities are again seen bilaterally stable from the prior exam. Small effusions are seen as well. Degenerative changes of the thoracic spine are noted. IMPRESSION: Stable airspace opacities bilaterally with effusions. Electronically Signed   By: Inez Catalina M.D.   On: 04/08/2019 22:22    Cardiac Studies   TTE: (04/12/19) 1.  Overall left ventricular systolic function is severely impaired with an EF between 25-30%. 2.  Pseudonormal LV filling pattern, consistent with elevated LA pressure. 3.  Basal lateral, basal inferior, mid lateral, inferior, apical lateral, and apical inferior LV wall motion is akinetic. 4.  Moderate multi jet mitral regurgitation is present. 5.  Mild to moderate tricuspid regurgitation present. 6.  The right ventricular systolic pressure, as measured by Doppler, is 54 mmHg.  Patient Profile  Brendan [name] is a 64 y.o. male with uncontrolled diabetes, hypertension who was admitted to [facility] on 04/11/2019 with shortness of breath and concerns for pneumonia.  Subsequently found to have severely reduced LV function with regional wall motion abnormalities and non-STEMI.  Overall picture more consistent with indolent heart failure than pneumonia.  Assessment & Plan   1.  High risk non-STEMI with severely reduced EF, 25% with RWA -Admitted to [facility] with several days of diuresis.  There was concerns for pneumonia.  He does not have pneumonia.  Procalcitonin negative. This is all congestive heart failure.  He has poorly controlled diabetes with A1c 9.0.  I suspect he had his event several weeks ago and this is just progressed further. -Continue aspirin and heparin drip -Blood pressures are  marginal.  We will hold on beta-blocker for now.  We will proceed with right and left heart cath today.  I am a bit concerned about his soft blood pressures that he may be low output state.  If he has severe three-vessel disease CAD as I suspect he likely will need support.  We will await heart cath today. -No further diuresis.  He does appear to be fairly euvolemic on examination.  I suspect he will be needing further diuresis but I will hold until angiography is performed.  We will also get a better idea of volume status on right heart cath. -Continue high intensity statin -We will repeat echocardiogram here.  I cannot see  the images from [place].  2.  New onset systolic heart failure, ejection fraction 25% -Likely ischemic adenopathy.  Left heart cath and right heart cath as above.  No beta-blockers given narrow pulse pressure and soft blood pressures today. -I will hold diuresis.  Probably still is a bit volume up but we will get heart cath today to confirm volume status as well as confirm coronary anatomy.  3.  Nonsustained ventricular tachycardia -Also another ominous sign for severe three-vessel disease and ischemic cardiomyopathy.  No antiarrhythmics for now. -Unfortunately having to hold on beta-blocker for now.  Given soft blood pressures we will get cath done first.  4.  Uncontrolled diabetes -A1c 9.0 -Sliding scale insulin while in house -We will need diabetes education while here  FEN -No intravenous fluids -DVT PPx: Heparin drip -Diet: N.p.o. for left heart cath -Code: Full  For questions or updates, please contact [place] Please consult [email or web address] for contact info under   Time Spent with Patient: I have spent a total of 35 minutes with patient reviewing hospital notes, telemetry, EKGs, labs and examining the patient as well as establishing an assessment and plan that was discussed with the patient.  > 50% of time was spent in direct patient care.     Signed, Addison Naegeli. Audie Box, [place]  04/08/2019 7:47 AM

## 2019-04-15 NOTE — Progress Notes (Addendum)
CRITICAL VALUE ALERT  Critical Value:  6190  Date & Time Notied:  03/28/2019  2330  Provider Notified: Cardiology   Orders Received/Actions taken: pt denies chest pain, on iv heparin. Cath in am

## 2019-04-16 ENCOUNTER — Inpatient Hospital Stay (HOSPITAL_COMMUNITY): Payer: Medicaid Other

## 2019-04-16 ENCOUNTER — Other Ambulatory Visit: Payer: Self-pay

## 2019-04-16 DIAGNOSIS — I2510 Atherosclerotic heart disease of native coronary artery without angina pectoris: Secondary | ICD-10-CM

## 2019-04-16 DIAGNOSIS — I25110 Atherosclerotic heart disease of native coronary artery with unstable angina pectoris: Secondary | ICD-10-CM

## 2019-04-16 DIAGNOSIS — I509 Heart failure, unspecified: Secondary | ICD-10-CM

## 2019-04-16 DIAGNOSIS — E43 Unspecified severe protein-calorie malnutrition: Secondary | ICD-10-CM | POA: Insufficient documentation

## 2019-04-16 DIAGNOSIS — I340 Nonrheumatic mitral (valve) insufficiency: Secondary | ICD-10-CM

## 2019-04-16 DIAGNOSIS — Z01810 Encounter for preprocedural cardiovascular examination: Secondary | ICD-10-CM

## 2019-04-16 LAB — COMPREHENSIVE METABOLIC PANEL
ALT: 18 U/L (ref 0–44)
AST: 17 U/L (ref 15–41)
Albumin: 2.5 g/dL — ABNORMAL LOW (ref 3.5–5.0)
Alkaline Phosphatase: 83 U/L (ref 38–126)
Anion gap: 10 (ref 5–15)
BUN: 34 mg/dL — ABNORMAL HIGH (ref 8–23)
CO2: 23 mmol/L (ref 22–32)
Calcium: 8.2 mg/dL — ABNORMAL LOW (ref 8.9–10.3)
Chloride: 101 mmol/L (ref 98–111)
Creatinine, Ser: 0.99 mg/dL (ref 0.61–1.24)
GFR calc Af Amer: >60 mL/min (ref >60)
GFR calc non Af Amer: >60 mL/min (ref >60)
Glucose, Bld: 215 mg/dL — ABNORMAL HIGH (ref 70–99)
Potassium: 3.6 mmol/L (ref 3.5–5.1)
Sodium: 134 mmol/L — ABNORMAL LOW (ref 135–145)
Total Bilirubin: 0.6 mg/dL (ref 0.3–1.2)
Total Protein: 5.3 g/dL — ABNORMAL LOW (ref 6.5–8.1)

## 2019-04-16 LAB — PULMONARY FUNCTION TEST
FEF 25-75 Pre: 1.62 L/sec
FEF2575-%Pred-Pre: 53 %
FEV1-%Pred-Pre: 39 %
FEV1-Pre: 1.47 L
FEV1FVC-%Pred-Pre: 109 %
FEV6-%Pred-Pre: 37 %
FEV6-Pre: 1.78 L
FEV6FVC-%Pred-Pre: 104 %
FVC-%Pred-Pre: 36 %
FVC-Pre: 1.78 L
Pre FEV1/FVC ratio: 82 %
Pre FEV6/FVC Ratio: 100 %

## 2019-04-16 LAB — CBC
HCT: 30.2 % — ABNORMAL LOW (ref 39.0–52.0)
Hemoglobin: 9.8 g/dL — ABNORMAL LOW (ref 13.0–17.0)
MCH: 28.4 pg (ref 26.0–34.0)
MCHC: 32.5 g/dL (ref 30.0–36.0)
MCV: 87.5 fL (ref 80.0–100.0)
Platelets: 319 K/uL (ref 150–400)
RBC: 3.45 MIL/uL — ABNORMAL LOW (ref 4.22–5.81)
RDW: 12.9 % (ref 11.5–15.5)
WBC: 9 K/uL (ref 4.0–10.5)
nRBC: 0 % (ref 0.0–0.2)

## 2019-04-16 LAB — BASIC METABOLIC PANEL
Anion gap: 11 (ref 5–15)
BUN: 36 mg/dL — ABNORMAL HIGH (ref 8–23)
CO2: 26 mmol/L (ref 22–32)
Calcium: 8.7 mg/dL — ABNORMAL LOW (ref 8.9–10.3)
Chloride: 97 mmol/L — ABNORMAL LOW (ref 98–111)
Creatinine, Ser: 1.17 mg/dL (ref 0.61–1.24)
GFR calc Af Amer: >60 mL/min (ref >60)
GFR calc non Af Amer: >60 mL/min (ref >60)
Glucose, Bld: 375 mg/dL — ABNORMAL HIGH (ref 70–99)
Potassium: 4.1 mmol/L (ref 3.5–5.1)
Sodium: 134 mmol/L — ABNORMAL LOW (ref 135–145)

## 2019-04-16 LAB — RETICULOCYTES
Immature Retic Fract: 8.9 % (ref 2.3–15.9)
RBC.: 3.59 MIL/uL — ABNORMAL LOW (ref 4.22–5.81)
Retic Count, Absolute: 94.4 10*3/uL (ref 19.0–186.0)
Retic Ct Pct: 2.6 % (ref 0.4–3.1)

## 2019-04-16 LAB — MAGNESIUM: Magnesium: 2.6 mg/dL — ABNORMAL HIGH (ref 1.7–2.4)

## 2019-04-16 LAB — PREALBUMIN: Prealbumin: 8.9 mg/dL — ABNORMAL LOW (ref 18–38)

## 2019-04-16 LAB — COOXEMETRY PANEL
Carboxyhemoglobin: 1.4 % (ref 0.5–1.5)
Carboxyhemoglobin: 1.2 % (ref 0.5–1.5)
Carboxyhemoglobin: 1.3 % (ref 0.5–1.5)
Methemoglobin: 1 % (ref 0.0–1.5)
Methemoglobin: 1.1 % (ref 0.0–1.5)
Methemoglobin: 0.8 % (ref 0.0–1.5)
O2 Saturation: 62.9 %
O2 Saturation: 50 %
O2 Saturation: 49.9 %
Total hemoglobin: 9.6 g/dL — ABNORMAL LOW (ref 12.0–16.0)
Total hemoglobin: 9.7 g/dL — ABNORMAL LOW (ref 12.0–16.0)
Total hemoglobin: 10.2 g/dL — ABNORMAL LOW (ref 12.0–16.0)

## 2019-04-16 LAB — GLUCOSE, CAPILLARY
Glucose-Capillary: 240 mg/dL — ABNORMAL HIGH (ref 70–99)
Glucose-Capillary: 194 mg/dL — ABNORMAL HIGH (ref 70–99)
Glucose-Capillary: 303 mg/dL — ABNORMAL HIGH (ref 70–99)
Glucose-Capillary: 291 mg/dL — ABNORMAL HIGH (ref 70–99)
Glucose-Capillary: 164 mg/dL — ABNORMAL HIGH (ref 70–99)

## 2019-04-16 LAB — HEPARIN LEVEL (UNFRACTIONATED)
Heparin Unfractionated: 0.24 IU/mL — ABNORMAL LOW (ref 0.30–0.70)
Heparin Unfractionated: 0.61 IU/mL (ref 0.30–0.70)
Heparin Unfractionated: 0.49 IU/mL (ref 0.30–0.70)

## 2019-04-16 LAB — IRON AND TIBC
Iron: 31 ug/dL — ABNORMAL LOW (ref 45–182)
Saturation Ratios: 15 % — ABNORMAL LOW (ref 17.9–39.5)
TIBC: 204 ug/dL — ABNORMAL LOW (ref 250–450)
UIBC: 173 ug/dL

## 2019-04-16 LAB — FOLATE: Folate: 17.5 ng/mL (ref >5.9)

## 2019-04-16 LAB — VITAMIN B12: Vitamin B-12: 893 pg/mL (ref 180–914)

## 2019-04-16 LAB — FERRITIN: Ferritin: 655 ng/mL — ABNORMAL HIGH (ref 24–336)

## 2019-04-16 MED ORDER — AMIODARONE LOAD VIA INFUSION
150.0000 mg | Freq: Once | INTRAVENOUS | Status: AC
  Administered 2019-04-16: 150 mg via INTRAVENOUS
  Filled 2019-04-16: qty 83.34

## 2019-04-16 MED ORDER — INSULIN GLARGINE 100 UNIT/ML ~~LOC~~ SOLN
28.0000 [IU] | Freq: Every day | SUBCUTANEOUS | Status: DC
  Administered 2019-04-16: 28 [IU] via SUBCUTANEOUS
  Filled 2019-04-16 (×2): qty 0.28

## 2019-04-16 MED ORDER — ALPRAZOLAM 0.25 MG PO TABS
0.2500 mg | ORAL_TABLET | Freq: Two times a day (BID) | ORAL | Status: DC | PRN
  Administered 2019-04-16 – 2019-04-17 (×2): 0.25 mg via ORAL
  Filled 2019-04-16 (×2): qty 1

## 2019-04-16 MED ORDER — MILRINONE LACTATE IN DEXTROSE 20-5 MG/100ML-% IV SOLN: 0.1250 ug/kg/min | INTRAVENOUS | Status: DC

## 2019-04-16 MED ORDER — POTASSIUM CHLORIDE CRYS ER 20 MEQ PO TBCR
40.0000 meq | EXTENDED_RELEASE_TABLET | Freq: Once | ORAL | Status: AC
  Administered 2019-04-16: 40 meq via ORAL
  Filled 2019-04-16: qty 2

## 2019-04-16 MED ORDER — INSULIN ASPART 100 UNIT/ML ~~LOC~~ SOLN
8.0000 [IU] | Freq: Three times a day (TID) | SUBCUTANEOUS | Status: DC
  Administered 2019-04-16 – 2019-04-17 (×2): 8 [IU] via SUBCUTANEOUS

## 2019-04-16 MED ORDER — AMIODARONE HCL IN DEXTROSE 360-4.14 MG/200ML-% IV SOLN
30.0000 mg/h | INTRAVENOUS | Status: DC
  Administered 2019-04-16 – 2019-04-18 (×9): 60 mg/h via INTRAVENOUS
  Administered 2019-04-19: 30 mg/h via INTRAVENOUS
  Administered 2019-04-19 (×2): 60 mg/h via INTRAVENOUS
  Administered 2019-04-20 – 2019-04-30 (×22): 30 mg/h via INTRAVENOUS
  Filled 2019-04-16 (×34): qty 200

## 2019-04-16 MED ORDER — LIDOCAINE BOLUS VIA INFUSION
50.0000 mg | Freq: Once | INTRAVENOUS | Status: AC
  Administered 2019-04-16: 375 mg via INTRAVENOUS
  Filled 2019-04-16: qty 52

## 2019-04-16 MED ORDER — FUROSEMIDE 10 MG/ML IJ SOLN
80.0000 mg | Freq: Once | INTRAMUSCULAR | Status: AC
  Administered 2019-04-16: 80 mg via INTRAVENOUS
  Filled 2019-04-16: qty 8

## 2019-04-16 MED ORDER — AMIODARONE LOAD VIA INFUSION
150.0000 mg | Freq: Once | INTRAVENOUS | Status: DC
  Filled 2019-04-16: qty 83.34

## 2019-04-16 MED ORDER — LIDOCAINE IN D5W 4-5 MG/ML-% IV SOLN
3.0000 mg/min | INTRAVENOUS | Status: DC
  Administered 2019-04-16: 1 mg/min via INTRAVENOUS
  Administered 2019-04-17: 3 mg/min via INTRAVENOUS
  Filled 2019-04-16 (×2): qty 500

## 2019-04-16 MED ORDER — ENSURE ENLIVE PO LIQD
237.0000 mL | Freq: Three times a day (TID) | ORAL | Status: DC
  Administered 2019-04-16 – 2019-04-17 (×4): 237 mL via ORAL

## 2019-04-16 MED FILL — Heparin Sodium (Porcine) Inj 1000 Unit/ML: INTRAMUSCULAR | Qty: 10 | Status: AC

## 2019-04-16 MED FILL — Verapamil HCl IV Soln 2.5 MG/ML: INTRAVENOUS | Qty: 2 | Status: AC

## 2019-04-16 NOTE — Consult Note (Signed)
[facility]       [name],Brendan [name]             [phone]        [name] Date of Birth: Oct 19, 1955  Referring: Dr. Haroldine [name] primary Care: Brendan Alu, MD Primary Cardiologist:No primary care provider on file.  Chief Complaint:   Respiratory distress, flash pulmonary edema  History of Present Illness:     Patient examined, images of coronary angiogram and echocardiogram and chest x-ray personally reviewed and discussed with patient and family.  64 year old poorly controlled diabetic transferred from [name] 4 days after admission for non-ST elevation MI associated with symptoms of heart failure and flash pulmonary edema.  No previous history of heart disease.  His troponins were 8.6 on presentation.  His EKG showed inferior wall ischemic changes and echocardiogram showed EF 20% with at least moderate mitral regurgitation.  RV function appears to be preserved. After transfer the patient underwent left and right heart cardiac catheterization showing severe three-vessel coronary disease with chronic occlusion of the circumflex and high-grade stenosis of the LAD system and RCA system.  LVEDP was 35, CVP 25 and cardiac index 1.9-2.0.  Patient was treated with diuretics with improved symptoms of shortness of breath.  Because of severe LV dysfunction and evidence of cardiogenic shock the patient was treated with central line and placed on heparin, milrinone and Lasix.  He also required low-dose norepinephrine last night.  He was started on amiodarone for multiple PVCs and short runs of V. tach with underlying junctional tachycardia.  Today he feels improved.  He is scheduled for carotid Dopplers, ABI assessment, CT scan of the chest without contrast to assess pulmonary disease and atherosclerotic aortic disease.  Patient has significant neuropathy in his feet with difficulty with ambulation and  history of falls.  Hemoglobin A1c 9.7.  Renal function and hepatic function appear to be fairly well preserved however the patient has severe low protein malnutrition with prealbumin of 8.2.  Current Activity/ Functional Status: Poor functional status for the past 2 weeks.  Patient has been disabled from work because of his diabetic neuropathy and has previously worked Education administrator.   Zubrod Score: At the time of surgery this patient's most appropriate activity status/level should be described as: []     0    Normal activity, no symptoms []     1    Restricted in physical strenuous activity but ambulatory, able to do out light work []     2    Ambulatory and capable of self care, unable to do work activities, up and about                 more than 50%  Of the time                            []     3    Only limited self care, in bed greater than 50% of waking hours [x]     4    Completely disabled, no self care, confined to bed or chair []     5    Moribund  Past Medical History:  Diagnosis Date  . Cholecystitis 03/2013  . Complication of anesthesia   . Diabetes mellitus without complication ([name])   . Erectile dysfunction 2010  . HTN (hypertension)   . NSTEMI (non-ST elevated myocardial infarction) ([name])  03/31/2019  . PONV (postoperative nausea and vomiting)     Past Surgical History:  Procedure Laterality Date  . APPENDECTOMY  1969  . CHOLECYSTECTOMY    . CHOLECYSTECTOMY N/A 04/02/2013   Procedure: LAPAROSCOPIC CHOLECYSTECTOMY WITH  INTRAOPERATIVE CHOLANGIOGRAM;  Surgeon: Brendan Faster. Cornett, MD;  Location: [place];  Service: General;  Laterality: N/A;  . ERCP N/A 04/02/2013   Procedure: ENDOSCOPIC RETROGRADE CHOLANGIOPANCREATOGRAPHY (ERCP);  Surgeon: Brendan Castle, MD;  Location: [place];  Service: Endoscopy;  Laterality: N/A;  . 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   Marland Kitchen RIGHT/LEFT HEART CATH AND CORONARY ANGIOGRAPHY N/A  04/07/2019   Procedure: RIGHT/LEFT HEART CATH AND CORONARY ANGIOGRAPHY;  Surgeon: [name], Brendan Sachdeva M, MD;  Location: [place] [name];  Service: Cardiovascular;  Laterality: N/A;    Social History   Tobacco Use  Smoking Status Never Smoker  Smokeless Tobacco Current User  . Types: Snuff, Chew    Social History   Substance and Sexual Activity  Alcohol Use Not Currently   Comment: occ     Allergies  Allergen Reactions  . Acetaminophen Itching    Current Facility-Administered Medications  Medication Dose Route Frequency Provider Last Rate Last Admin  . 0.9 %  sodium chloride infusion  250 mL Intravenous PRN [name], Brendan [name] M, MD      . amiodarone (NEXTERONE PREMIX) 360-4.14 MG/200ML-% (1.8 mg/mL) IV infusion  30 mg/hr Intravenous Continuous Brendan Maw, MD 16.67 mL/hr at 04/16/19 0800 30 mg/hr at 04/16/19 0800  . aspirin EC tablet 81 mg  81 mg Oral Daily [name], Jerriann Schrom M, MD   81 mg at 04/16/19 1007  . Chlorhexidine Gluconate Cloth 2 % PADS 6 each  6 each Topical Daily Bensimhon, Shaune Pascal, MD   6 each at 04/11/2019 1825  . digoxin (LANOXIN) tablet 0.125 mg  0.125 mg Oral Daily Bensimhon, Shaune Pascal, MD   0.125 mg at 04/16/19 1008  . feeding supplement (ENSURE ENLIVE) (ENSURE ENLIVE) liquid 237 mL  237 mL Oral TID BM Bensimhon, Shaune Pascal, MD      . heparin ADULT infusion 100 units/mL (25000 units/260mL sodium chloride 0.45%)  1,550 Units/hr Intravenous Continuous [name], RPH 15.5 mL/hr at 04/16/19 0800 1,550 Units/hr at 04/16/19 0800  . insulin aspart (novoLOG) injection 0-15 Units  0-15 Units Subcutaneous TID WC [name], Einer Meals M, MD   8 Units at 04/16/19 1208  . insulin aspart (novoLOG) injection 4 Units  4 Units Subcutaneous TID WC [name], Koltan Portocarrero M, MD   4 Units at 04/16/19 1208  . insulin glargine (LANTUS) injection 20 Units  20 Units Subcutaneous QHS [name], Ismerai Bin M, MD   20 Units at 04/16/19 0206  . milrinone (PRIMACOR) 20 MG/100 ML (0.2 mg/mL) infusion  0.125 mcg/kg/min  Intravenous Continuous Bensimhon, Shaune Pascal, MD 2.53 mL/hr at 04/16/19 0800 0.125 mcg/kg/min at 04/16/19 0800  . multivitamin with minerals tablet 1 tablet  1 tablet Oral Daily Ivin Poot, MD   1 tablet at 04/16/19 1008  . nitroGLYCERIN (NITROSTAT) SL tablet 0.4 mg  0.4 mg Sublingual Q5 Min x 3 PRN [name], Barett Whidbee M, MD      . norepinephrine (LEVOPHED) 4mg  in 252mL premix infusion  0-40 mcg/min Intravenous Titrated Bensimhon, Shaune Pascal, MD   Stopped at 04/16/19 [phone]  . ondansetron (ZOFRAN) injection 4 mg  4 mg Intravenous Q6H PRN [name], Santoria Chason M, MD      . pneumococcal 23 valent vaccine (PNEUMOVAX-23)  injection 0.5 mL  0.5 mL Intramuscular Tomorrow-1000 Bensimhon, Shaune Pascal, MD   Stopped at 04/16/19 1010  . potassium chloride SA (KLOR-CON) CR tablet 40 mEq  40 mEq Oral BID Bensimhon, Shaune Pascal, MD   40 mEq at 04/16/19 1009  . rosuvastatin (CRESTOR) tablet 40 mg  40 mg Oral q1800 [name], Talyssa Gibas M, MD   40 mg at 03/18/2019 1826  . sodium chloride flush (NS) 0.9 % injection 10-40 mL  10-40 mL Intracatheter Q12H Bensimhon, Shaune Pascal, MD   30 mL at 04/16/19 1010  . sodium chloride flush (NS) 0.9 % injection 10-40 mL  10-40 mL Intracatheter PRN Bensimhon, Shaune Pascal, MD      . sodium chloride flush (NS) 0.9 % injection 3 mL  3 mL Intravenous Q12H [name], Ercel Pepitone M, MD   3 mL at 04/16/19 1011  . sodium chloride flush (NS) 0.9 % injection 3 mL  3 mL Intravenous PRN [name], Lauraine Crespo M, MD      . spironolactone (ALDACTONE) tablet 12.5 mg  12.5 mg Oral Daily Bensimhon, Shaune Pascal, MD   12.5 mg at 04/16/19 1007  . temazepam (RESTORIL) capsule 15 mg  15 mg Oral QHS PRN Ivin Poot, MD   15 mg at 03/31/2019 2312  . traMADol (ULTRAM) tablet 50 mg  50 mg Oral Q6H PRN [name], Iridiana Fonner M, MD   50 mg at 03/18/2019 0030    Medications Prior to Admission  Medication Sig Dispense Refill Last Dose  . Insulin Glargine (BASAGLAR KWIKPEN) 100 UNIT/ML Inject 28 Units into the skin at bedtime.   04/10/2019  . insulin lispro (HUMALOG) 100  UNIT/ML injection Inject 8 Units into the skin 2 (two) times daily before a meal.   04/10/2019  . metFORMIN (GLUCOPHAGE) 500 MG tablet Take 1 tablet (500 mg total) by mouth 2 (two) times daily with a meal. 60 tablet 3 Past Month at Unknown time  . glucose blood (RELION GLUCOSE TEST STRIPS) test strip Use as instructed 100 each 12   . Insulin Pen Needle (PEN NEEDLES) 32G X 4 MM MISC      . ReliOn Ultra Thin Lancets MISC Use to test blood sugar up to 4 times daily. 210 each 3     Family History  Problem Relation Age of Onset  . Colon cancer Maternal Aunt   . Diabetes Maternal Grandmother   . Stroke Maternal Grandmother      Review of Systems:   ROS Patient is left-hand dominant Patient has had previous cholecystectomy 5 years ago at [name] without anesthetic or pulmonary or cardiac complication. Patient is edentulous with an upper plate. No history of DVT or venous insufficiency lower extremities No history of thoracic trauma He is insulin-dependent diabetic    Cardiac Review of Systems: Y or  [    ]= no  Chest Pain [ y   ]  Resting SOB [  y ] Exertional SOB  [ y ]  Vertell Limber Blue.Reese  ]   Pedal Edema [   ]    Palpitations [  y] Syncope  [  ]   Presyncope [   ]  General Review of Systems: [Y] = yes [  ]=no Constitional: recent weight change Blue.Reese  ]; anorexia [ y ]; fatigue [ y ]; nausea [  ]; night sweats [  ]; fever [  ]; or chills [  ]  Dental: Last Dentist visit: 1 year  Eye : blurred vision [  ]; diplopia [   ]; vision changes [  ];  Amaurosis fugax[  ]; Resp: cough [  ];  wheezing[  ];  hemoptysis[  ]; shortness of breath[ y ]; paroxysmal nocturnal dyspnea[  ]; dyspnea on exertion[y  ]; or orthopnea[  ];  GI:  gallstones[  ], vomiting[  ];  dysphagia[  ]; melena[  ];  hematochezia [  ]; heartburn[  ];   Hx of  Colonoscopy[  ]; GU: kidney stones [ y history of cholecystectomy]; hematuria[  ];   dysuria [  ];  nocturia[  ];   history of     obstruction [  ]; urinary frequency [  ]             Skin: rash, swelling[  ];, hair loss[  ];  peripheral edema[  ];  or itching[  ]; Musculosketetal: myalgias[  ];  joint swelling[  ];  joint erythema[  ];  joint pain[  ];  back pain[  ];  Heme/Lymph: bruising[  ];  bleeding[  ];  anemia[  ];  Neuro: TIA[  ];  headaches[  ];  stroke[  ];  vertigo[  ];  seizures[  ];   paresthesias[  ];  difficulty walking[  ];  Psych:depression[  ]; anxiety[  ];  Endocrine: diabetes[y  ];  thyroid dysfunction[  ];                 Physical Exam: BP 91/63   Pulse 91   Temp 98.5 F (36.9 C) (Oral)   Resp (!) 24   Ht 5\' 11"  (1.803 [name])   Wt 65.6 kg   SpO2 (!) 76%   BMI 20.17 kg/[name]       Physical Exam  General: Well-nourished middle-aged male no acute distress HEENT: Normocephalic pupils equal , dentition adequate Neck: Supple without JVD, adenopathy, or bruit Chest: Clear to auscultation, symmetrical breath sounds, no rhonchi, no tenderness             or deformity Cardiovascular: Regular rate and rhythm, no murmur, no gallop, peripheral pulses             palpable in all extremities Abdomen:  Soft, nontender, no palpable mass or organomegaly Extremities: Warm, well-perfused, no clubbing cyanosis edema or tenderness,              no venous stasis changes of the legs Rectal/GU: Deferred Neuro: Grossly non--focal and symmetrical throughout Skin: Clean and dry without rash or ulceration   Diagnostic Studies & Laboratory data:     Recent Radiology Findings:   DG Chest 2 View  Result Date: 04/05/2019 CLINICAL DATA:  Pneumonia EXAM: CHEST - 2 VIEW COMPARISON:  04/08/2019 FINDINGS: Cardiac shadow is stable. Lungs are well aerated bilaterally. Diffuse airspace opacities are again seen bilaterally stable from the prior exam. Small effusions are seen as well. Degenerative changes of the thoracic spine are noted. IMPRESSION: Stable airspace opacities bilaterally with effusions.  Electronically Signed   By: Inez Catalina [name].D.   On: 03/21/2019 22:22   CARDIAC CATHETERIZATION  Result Date: 03/26/2019  Ost LM to Mid LM lesion is 35% stenosed.  Prox LAD lesion is 90% stenosed.  Prox LAD to Mid LAD lesion is 50% stenosed.  Mid Cx lesion is 100% stenosed.  Prox RCA to Mid RCA lesion is 90% stenosed.  RPDA lesion is 95% stenosed.  LV end diastolic pressure is  severely elevated.  Hemodynamic findings consistent with moderate pulmonary hypertension.  1. Severe 3 vessel obstructive CAD 2. High LV filling pressures 3. Reduced cardiac output with index 2.3 4. Moderate pulmonary HTN. Plan: plan to transfer to the ICU and place PICC line. Initiate IV milrinone. Tune up CHF. Consider revascularization with CABG. The LAD and diagonal appear to be suitable targets and probably the distal RCA.   ECHOCARDIOGRAM COMPLETE  Result Date: 03/25/2019    ECHOCARDIOGRAM REPORT   Patient Name:   [name] Date of Exam: 03/23/2019 Medical Rec #:  [id number]        Height:       71.0 in Accession #:    [id number]       Weight:       148.9 lb Date of Birth:  06/10/1955        BSA:          1.860 [name] Patient Age:    59 years         BP:           103/76 mmHg Patient Gender: [name]                HR:           86 bpm. Exam Location:  Inpatient Procedure: 2D Echo Indications:    Acute Cornonary Syndrome I24.9  History:        Patient has no prior history of Echocardiogram examinations.                 NSTEMI; Risk Factors:Diabetes and Hypertension.  Sonographer:    Mikki Santee RDCS (AE) Referring Phys: [place]  1. Left ventricular ejection fraction, by estimation, is 30 to 35%. The left ventricle has moderately decreased function. The left ventricle demonstrates regional wall motion abnormalities (see scoring diagram/findings for description). There is mild left ventricular hypertrophy. Left ventricular diastolic function could not be evaluated. There is severe hypokinesis of the  left ventricular, entire inferior wall, inferoseptal wall, apical segment and lateral wall.  2. Right ventricular systolic function is hyperdynamic. The right ventricular size is normal. There is moderately elevated pulmonary artery systolic pressure.  3. Decreased posterior leaflet motion due to ischemic tethering of the mitral valve leaflets.  4. The mitral valve is abnormal. Moderate to severe mitral valve regurgitation.  5. The aortic valve is tricuspid. Aortic valve regurgitation is not visualized. Mild aortic valve sclerosis is present, with no evidence of aortic valve stenosis.  6. The inferior vena cava is normal in size with greater than 50% respiratory variability, suggesting right atrial pressure of 3 mmHg. FINDINGS  Left Ventricle: Left ventricular ejection fraction, by estimation, is 30 to 35%. The left ventricle has moderately decreased function. The left ventricle demonstrates regional wall motion abnormalities. Severe hypokinesis of the left ventricular, entire  inferior wall, inferoseptal wall, apical segment and lateral wall. The left ventricular internal cavity size was normal in size. There is mild left ventricular hypertrophy. Left ventricular diastolic function could not be evaluated due to atrial fibrillation. Left ventricular diastolic function could not be evaluated. Right Ventricle: The right ventricular size is normal. No increase in right ventricular wall thickness. Right ventricular systolic function is hyperdynamic. There is moderately elevated pulmonary artery systolic pressure. The tricuspid regurgitant velocity is 3.37 [name]/s, and with an assumed right atrial pressure of 3 mmHg, the estimated right ventricular systolic pressure is 06.2 mmHg. Left Atrium: Left atrial size was normal in size. Right Atrium: Right  atrial size was normal in size. Pericardium: There is no evidence of pericardial effusion. Mitral Valve: The mitral valve is abnormal. There is mild thickening of the mitral valve  leaflet(s). Decreased posterior leaflet motion due to ischemic tethering of the mitral valve leaflets. Moderate to severe mitral valve regurgitation. Tricuspid Valve: The tricuspid valve is grossly normal. Tricuspid valve regurgitation is mild. Aortic Valve: The aortic valve is tricuspid. Aortic valve regurgitation is not visualized. Mild aortic valve sclerosis is present, with no evidence of aortic valve stenosis. Pulmonic Valve: The pulmonic valve was normal in structure. Pulmonic valve regurgitation is not visualized. Aorta: The aortic root and ascending aorta are structurally normal, with no evidence of dilitation. Venous: The inferior vena cava is normal in size with greater than 50% respiratory variability, suggesting right atrial pressure of 3 mmHg. IAS/Shunts: The interatrial septum appears to be lipomatous. No atrial level shunt detected by color flow Doppler.  LEFT VENTRICLE PLAX 2D LVIDd:         5.70 cm      Diastology LVIDs:         5.00 cm      LV e' lateral:   4.81 cm/s LV PW:         1.10 cm      LV E/e' lateral: 21.3 LV IVS:        0.90 cm      LV e' medial:    5.98 cm/s LVOT diam:     2.40 cm      LV E/e' medial:  17.1 LV SV:         48 LV SV Index:   26 LVOT Area:     4.52 cm  LV Volumes (MOD) LV vol d, MOD A2C: 96.6 ml LV vol d, MOD A4C: 126.0 ml LV vol s, MOD A2C: 62.4 ml LV vol s, MOD A4C: 87.8 ml LV SV MOD A2C:     34.2 ml LV SV MOD A4C:     126.0 ml LV SV MOD BP:      34.9 ml RIGHT VENTRICLE RV S prime:     11.90 cm/s TAPSE ([name]-mode): 1.6 cm LEFT ATRIUM           Index       RIGHT ATRIUM           Index LA diam:      3.90 cm 2.10 cm/[name]  RA Area:     11.50 cm LA Vol (A2C): 62.2 ml 33.44 ml/[name] RA Volume:   27.70 ml  14.89 ml/[name] LA Vol (A4C): 57.8 ml 31.07 ml/[name]  AORTIC VALVE LVOT Vmax:   65.00 cm/s LVOT Vmean:  42.700 cm/s LVOT VTI:    0.107 [name]  AORTA Ao Root diam: 3.30 cm MITRAL VALVE                TRICUSPID VALVE MV Area (PHT): 2.37 cm     TR Peak grad:   45.4 mmHg MV Decel Time: 320 msec      TR Vmax:        337.00 cm/s MR Peak grad: 48.2 mmHg MR Mean grad: 33.0 mmHg     SHUNTS MR Vmax:      347.00 cm/s   Systemic VTI:  0.11 [name] MR Vmean:     273.0 cm/s    Systemic Diam: 2.40 cm MV E velocity: 102.50 cm/s MV A velocity: 35.00 cm/s MV E/A ratio:  2.93 Lyman Bishop MD Electronically signed by Lyman Bishop MD Signature Date/Time:  03/22/2019/12:51:12 PM    Final    [place] EKG SITE RITE  Result Date: 04/08/2019 If Site Rite image not attached, placement could not be confirmed due to current cardiac rhythm.    I have independently reviewed the above radiologic studies and discussed with the patient.  Chest x-ray shows small pleural effusions, vascular congestion, cardiomegaly  Recent [name] Findings: [name] Results  Component Value Date   WBC 9.0 04/16/2019   HGB 9.8 (L) 04/16/2019   HCT 30.2 (L) 04/16/2019   PLT 319 04/16/2019   GLUCOSE 215 (H) 04/16/2019   CHOL 174 08/21/2017   TRIG 143 08/21/2017   HDL 57 08/21/2017   LDLCALC 88 08/21/2017   ALT 18 04/16/2019   AST 17 04/16/2019   NA 134 (L) 04/16/2019   K 3.6 04/16/2019   CL 101 04/16/2019   CREATININE 0.99 04/16/2019   BUN 34 (H) 04/16/2019   CO2 23 04/16/2019   TSH 0.925 03/30/2019   INR 1.3 (H) 04/07/2019   HGBA1C 9.0 (H) 03/22/2019      Assessment / Plan:   Non-ST elevation MI, severe LV dysfunction with ischemic cardiomyopathy, ischemic mitral regurgitation and acute symptoms of heart failure  Diabetes mellitus with neuropathy, poorly controlled Malnutrition with low protein, low prealbumin     moderate to severe  The patient would benefit from high risk surgical coronary revascularization with bypass graft to the LAD, posterior descending, and diagonal.  The circumflex marginal appears to be chronically occluded, atretic and in adequate target.  He would also benefit from probable mitral valve repair-replacement pending the TEE findings at surgery.  Because of his severe LV dysfunction, low cardiac output and  difficult heart rates he also would probably benefit from perioperative support with Impella 5.5 direct percutaneous LVAD.  I have discussed the plan for surgery with the patient and his family.  They understand that he needs more evaluation with Dopplers, CT scan and medical optimization with diuresis and improved cardiac output.  Plan surgery later this week.     @ME1 @ 04/16/2019 12:31 PM

## 2019-04-16 NOTE — Progress Notes (Signed)
PT Cancellation Note  Patient Details Name: Brendan [name] MRN: [id number] DOB: 1955-11-14   Cancelled Treatment:    Reason Eval/Treat Not Completed: Medical issues which prohibited therapy. Per discussion with RN patient has demonstrated multiple runs of Vtach and bigeminy throughout the day. RN requests PT hold mobility OOB at this time due to concern over current cardiac rhythm and stability. PT will attempt to follow up when medically appropriate. Of note pt with plan for CABG later this week.   Zenaida Niece 04/16/2019, 3:28 PM

## 2019-04-16 NOTE — Progress Notes (Signed)
   Having frequent runs of NSVT.   CO-OX stable. Stop milrinone. GIve 150 mg amio bolus now and increased to 60 mg per hour.   CHeck BMET now.  Check CO-OX in 2 hours off milrinone.   Drema Eddington NP-C  12:32 PM

## 2019-04-16 NOTE — Progress Notes (Signed)
Patiient admitted with acute systolic HF. lvEF 82-42%. Cath with severe 3 vessel CAD, planning for CABG Friday. Cardiogenic shock, has been on milrinone.  Has had issues with NSVT. Started amio gtt, milrionone intermittently held but significant drop in coox, back on at 0.125   Amion bolused 150 x3 since lat night, has been on continous drip.  Lidocaine started at 1mg /min.  K 4.1 Mg 2.6    Tele reviewed, short runs on polymorphic NSVT progressing. Patient is asymptomatic, sitting comfortable in bed. Lido was just started at 1 mg/min at 5pm. We will actually bolus 100mg  and increase rate to 2mg /min, continue milrinone for now. Follow patient closely, may require IABP.    Carlyle Dolly MD

## 2019-04-16 NOTE — Progress Notes (Signed)
ANTICOAGULATION CONSULT NOTE  Pharmacy Consult for Heparin Indication: chest pain/ACS  Heparin Dosing Weight: 68.2 kg  Labs: Recent Labs    03/29/2019 2149 04/01/2019 2212 03/24/2019 0113 03/28/2019 0427 03/29/2019 1505 04/10/2019 2127 04/15/2019 2213 04/16/19 0448 04/16/19 1218 04/16/19 1225 04/16/19 1813  HGB  --   --  9.8*   < > 9.2*  --  9.9* 9.8*  --   --   --   HCT  --   --  29.7*   < > 27.0*  --  29.0* 30.2*  --   --   --   PLT  --   --  261  --   --   --   --  319  --   --   --   APTT 65*  --   --   --   --   --   --   --   --   --   --   LABPROT 15.6*  --   --   --   --   --   --   --   --   --   --   INR 1.3*  --   --   --   --   --   --   --   --   --   --   HEPARINUNFRC  --    < >  --    < >  --   --   --  0.24* 0.61  --  0.49  CREATININE 1.28*   < > 1.26*  --   --    < > 0.90 0.99  --  1.17  --    < > = values in this interval not displayed.    Assessment: 64 yo male presenting to [facility] with malaise and SOB for 4 days found to have elevated troponin and new depressed EF. Transferred to [facility] for further evaluation. Patient was started on heparin IV on 3/26, no anticoagulation PTA.   Patient now s/p cath 3/30 showing severe 3-vessel obstructive CAD, high LV filling pressures, reduced cardiac output, and moderate pulmonary HTN. After cath patient was transfered to the ICU and place PICC for IV milrinone, considering CABG. Pharmacy consulted to resume heparin 8 hours post-sheath removal (removed at 1510 per cath procedure log).  Heparin level 0.49 on heparin 1550 units/hr is therapeutic. No issue with infusion or bleeding per RN. Continues to be in NSVT despite amiodarone and lidocaine gtt.   Goal of Therapy:  Heparin level 0.3-0.7 units/ml Monitor platelets by anticoagulation protocol: Yes   Plan:  Continue heparin to 1550 units/hr  Monitor daily HL, CBC/plt Monitor for signs/symptoms of bleeding  Follow up plans for CABG   Benetta Spar, PharmD, BCPS,  [facility] Clinical Pharmacist  Please check AMION for all [place] phone numbers After 10:00 PM, call [place] [phone]

## 2019-04-16 NOTE — Progress Notes (Addendum)
Advanced Heart Failure Rounding Note  PCP-Cardiologist: No primary care provider on file.   Subjective:   Cath 3/30 with severe 3 vessel disease.  CO-OX 49%-->  Started milrinone post cath and diuresed with IV lasix + metolazone.   CO-OX 62%.  Negative 2.5 liters.   Started amio drip for NSVT.   Denies chest pain. Denies shortness of breath.   Objective:   Weight Range: 65.6 kg Body mass index is 20.17 kg/m.   Vital Signs:   Temp:  [97.5 F (36.4 C)-99.3 F (37.4 C)] 98.3 F (36.8 C) (03/31 0745) Pulse Rate:  [25-153] 74 (03/31 0600) Resp:  [10-34] 15 (03/31 0600) BP: (80-122)/(52-87) 94/63 (03/31 0600) SpO2:  [85 %-100 %] 96 % (03/31 0600) FiO2 (%):  [98 %] 98 % (03/30 1531) Weight:  [65.6 kg] 65.6 kg (03/31 0600) Last BM Date: 03/24/2019  Weight change: Filed Weights   03/25/2019 2123 04/02/2019 0519 04/16/19 0600  Weight: 68.2 kg 67.5 kg 65.6 kg    Intake/Output:   Intake/Output Summary (Last 24 hours) at 04/16/2019 0802 Last data filed at 04/16/2019 0634 Gross per 24 hour  Intake 495.93 ml  Output 3075 ml  Net -2579.07 ml      Physical Exam   CVP 7-8  General:  Thin.  No resp difficulty HEENT: Normal Neck: Supple. JVP 7-8  . Carotids 2+ bilat; no bruits. No lymphadenopathy or thyromegaly appreciated. Cor: PMI nondisplaced. Regular rate & rhythm. No rubs, gallops or murmurs. Lungs: Clear Abdomen: Soft, nontender, nondistended. No hepatosplenomegaly. No bruits or masses. Good bowel sounds. Extremities: No cyanosis, clubbing, rash, edema. RUE PICC Neuro: Alert & orientedx3, cranial nerves grossly intact. moves all 4 extremities w/o difficulty. Affect pleasant   Telemetry   SR and NSVT  Personally reviewed  Labs    CBC Recent Labs    04/04/2019 0113 04/09/2019 1501 04/12/2019 2213 04/16/19 0448  WBC 8.1  --   --  9.0  HGB 9.8*   < > 9.9* 9.8*  HCT 29.7*   < > 29.0* 30.2*  MCV 86.8  --   --  87.5  PLT 261  --   --  319   < > = values in this  interval not displayed.   Basic Metabolic Panel Recent Labs    03/24/2019 2127 03/30/2019 2127 04/07/2019 2213 04/16/19 0448  NA 137   < > 132* 134*  K 3.9   < > 4.0 3.6  CL 106   < > 99 101  CO2 23  --   --  23  GLUCOSE 191*   < > 325* 215*  BUN 36*   < > 34* 34*  CREATININE 0.92   < > 0.90 0.99  CALCIUM 7.9*  --   --  8.2*  MG 2.3  --   --  2.6*   < > = values in this interval not displayed.   Liver Function Tests Recent Labs    04/03/2019 2149 04/16/19 0448  AST 24 17  ALT 22 18  ALKPHOS 89 83  BILITOT 0.8 0.6  PROT 5.8* 5.3*  ALBUMIN 2.7* 2.5*   No results for input(s): LIPASE, AMYLASE in the last 72 hours. Cardiac Enzymes No results for input(s): CKTOTAL, CKMB, CKMBINDEX, TROPONINI in the last 72 hours.  BNP: BNP (last 3 results) Recent Labs    04/01/2019 0113  BNP 655.7*    ProBNP (last 3 results) No results for input(s): PROBNP in the last 8760 hours.   D-Dimer  No results for input(s): DDIMER in the last 72 hours. Hemoglobin A1C Recent Labs    03/24/2019 2149  HGBA1C 9.0*   Fasting Lipid Panel No results for input(s): CHOL, HDL, LDLCALC, TRIG, CHOLHDL, LDLDIRECT in the last 72 hours. Thyroid Function Tests Recent Labs    03/21/2019 0113  TSH 0.925    Other results:   Imaging    CARDIAC CATHETERIZATION  Result Date: 04/02/2019  Ost LM to Mid LM lesion is 35% stenosed.  Prox LAD lesion is 90% stenosed.  Prox LAD to Mid LAD lesion is 50% stenosed.  Mid Cx lesion is 100% stenosed.  Prox RCA to Mid RCA lesion is 90% stenosed.  RPDA lesion is 95% stenosed.  LV end diastolic pressure is severely elevated.  Hemodynamic findings consistent with moderate pulmonary hypertension.  1. Severe 3 vessel obstructive CAD 2. High LV filling pressures 3. Reduced cardiac output with index 2.3 4. Moderate pulmonary HTN. Plan: plan to transfer to the ICU and place PICC line. Initiate IV milrinone. Tune up CHF. Consider revascularization with CABG. The LAD and  diagonal appear to be suitable targets and probably the distal RCA.   ECHOCARDIOGRAM COMPLETE  Result Date: 04/03/2019    ECHOCARDIOGRAM REPORT   Patient Name:   Brendan [name] Date of Exam: 03/24/2019 Medical Rec #:  [id number]        Height:       71.0 in Accession #:    [id number]       Weight:       148.9 lb Date of Birth:  [birth year]        BSA:          1.860 m Patient Age:    64 years         BP:           103/76 mmHg Patient Gender: M                HR:           86 bpm. Exam Location:  Inpatient Procedure: 2D Echo Indications:    Acute Cornonary Syndrome I24.9  History:        Patient has no prior history of Echocardiogram examinations.                 NSTEMI; Risk Factors:Diabetes and Hypertension.  Sonographer:    Mikki Santee RDCS (AE) Referring Phys: [place]  1. Left ventricular ejection fraction, by estimation, is 30 to 35%. The left ventricle has moderately decreased function. The left ventricle demonstrates regional wall motion abnormalities (see scoring diagram/findings for description). There is mild left ventricular hypertrophy. Left ventricular diastolic function could not be evaluated. There is severe hypokinesis of the left ventricular, entire inferior wall, inferoseptal wall, apical segment and lateral wall.  2. Right ventricular systolic function is hyperdynamic. The right ventricular size is normal. There is moderately elevated pulmonary artery systolic pressure.  3. Decreased posterior leaflet motion due to ischemic tethering of the mitral valve leaflets.  4. The mitral valve is abnormal. Moderate to severe mitral valve regurgitation.  5. The aortic valve is tricuspid. Aortic valve regurgitation is not visualized. Mild aortic valve sclerosis is present, with no evidence of aortic valve stenosis.  6. The inferior vena cava is normal in size with greater than 50% respiratory variability, suggesting right atrial pressure of 3 mmHg. FINDINGS  Left Ventricle:  Left ventricular ejection fraction, by estimation, is 30 to 35%. The left ventricle has moderately decreased  function. The left ventricle demonstrates regional wall motion abnormalities. Severe hypokinesis of the left ventricular, entire  inferior wall, inferoseptal wall, apical segment and lateral wall. The left ventricular internal cavity size was normal in size. There is mild left ventricular hypertrophy. Left ventricular diastolic function could not be evaluated due to atrial fibrillation. Left ventricular diastolic function could not be evaluated. Right Ventricle: The right ventricular size is normal. No increase in right ventricular wall thickness. Right ventricular systolic function is hyperdynamic. There is moderately elevated pulmonary artery systolic pressure. The tricuspid regurgitant velocity is 3.37 m/s, and with an assumed right atrial pressure of 3 mmHg, the estimated right ventricular systolic pressure is 56.3 mmHg. Left Atrium: Left atrial size was normal in size. Right Atrium: Right atrial size was normal in size. Pericardium: There is no evidence of pericardial effusion. Mitral Valve: The mitral valve is abnormal. There is mild thickening of the mitral valve leaflet(s). Decreased posterior leaflet motion due to ischemic tethering of the mitral valve leaflets. Moderate to severe mitral valve regurgitation. Tricuspid Valve: The tricuspid valve is grossly normal. Tricuspid valve regurgitation is mild. Aortic Valve: The aortic valve is tricuspid. Aortic valve regurgitation is not visualized. Mild aortic valve sclerosis is present, with no evidence of aortic valve stenosis. Pulmonic Valve: The pulmonic valve was normal in structure. Pulmonic valve regurgitation is not visualized. Aorta: The aortic root and ascending aorta are structurally normal, with no evidence of dilitation. Venous: The inferior vena cava is normal in size with greater than 50% respiratory variability, suggesting right atrial  pressure of 3 mmHg. IAS/Shunts: The interatrial septum appears to be lipomatous. No atrial level shunt detected by color flow Doppler.  LEFT VENTRICLE PLAX 2D LVIDd:         5.70 cm      Diastology LVIDs:         5.00 cm      LV e' lateral:   4.81 cm/s LV PW:         1.10 cm      LV E/e' lateral: 21.3 LV IVS:        0.90 cm      LV e' medial:    5.98 cm/s LVOT diam:     2.40 cm      LV E/e' medial:  17.1 LV SV:         48 LV SV Index:   26 LVOT Area:     4.52 cm  LV Volumes (MOD) LV vol d, MOD A2C: 96.6 ml LV vol d, MOD A4C: 126.0 ml LV vol s, MOD A2C: 62.4 ml LV vol s, MOD A4C: 87.8 ml LV SV MOD A2C:     34.2 ml LV SV MOD A4C:     126.0 ml LV SV MOD BP:      34.9 ml RIGHT VENTRICLE RV S prime:     11.90 cm/s TAPSE (M-mode): 1.6 cm LEFT ATRIUM           Index       RIGHT ATRIUM           Index LA diam:      3.90 cm 2.10 cm/m  RA Area:     11.50 cm LA Vol (A2C): 62.2 ml 33.44 ml/m RA Volume:   27.70 ml  14.89 ml/m LA Vol (A4C): 57.8 ml 31.07 ml/m  AORTIC VALVE LVOT Vmax:   65.00 cm/s LVOT Vmean:  42.700 cm/s LVOT VTI:    0.107 m  AORTA Ao Root diam: 3.30  cm MITRAL VALVE                TRICUSPID VALVE MV Area (PHT): 2.37 cm     TR Peak grad:   45.4 mmHg MV Decel Time: 320 msec     TR Vmax:        337.00 cm/s MR Peak grad: 48.2 mmHg MR Mean grad: 33.0 mmHg     SHUNTS MR Vmax:      347.00 cm/s   Systemic VTI:  0.11 m MR Vmean:     273.0 cm/s    Systemic Diam: 2.40 cm MV E velocity: 102.50 cm/s MV A velocity: 35.00 cm/s MV E/A ratio:  2.93 Lyman Bishop MD Electronically signed by Lyman Bishop MD Signature Date/Time: 03/25/2019/12:51:12 PM    Final    [place] EKG SITE RITE  Result Date: 04/05/2019 If Site Rite image not attached, placement could not be confirmed due to current cardiac rhythm.     Medications:     Scheduled Medications: . aspirin EC  81 mg Oral Daily  . Chlorhexidine Gluconate Cloth  6 each Topical Daily  . digoxin  0.125 mg Oral Daily  . feeding supplement (GLUCERNA SHAKE)  237 mL Oral  TID WC  . furosemide  80 mg Intravenous BID  . insulin aspart  0-15 Units Subcutaneous TID WC  . insulin aspart  4 Units Subcutaneous TID WC  . insulin glargine  20 Units Subcutaneous QHS  . metolazone  2.5 mg Oral Daily  . multivitamin with minerals  1 tablet Oral Daily  . pneumococcal 23 valent vaccine  0.5 mL Intramuscular Tomorrow-1000  . potassium chloride  40 mEq Oral BID  . rosuvastatin  40 mg Oral q1800  . sodium chloride flush  10-40 mL Intracatheter Q12H  . sodium chloride flush  3 mL Intravenous Q12H  . spironolactone  12.5 mg Oral Daily     Infusions: . sodium chloride    . amiodarone 30 mg/hr (04/16/19 0600)  . heparin 1,550 Units/hr (04/16/19 8841)  . milrinone 0.125 mcg/kg/min (04/16/19 0600)  . norepinephrine (LEVOPHED) Adult infusion Stopped (04/16/19 0439)     PRN Medications:  sodium chloride, nitroGLYCERIN, ondansetron (ZOFRAN) IV, sodium chloride flush, sodium chloride flush, temazepam, traMADol     Assessment/Plan    1. Acute Systolic HF, ICM  -ECHO completed today with EF 30-35%, RV normal, and mod-severe MR.  - Elevated filling pressures on cath.  - CVP down to 8 today. Give one more dose of IV lasix. Hold metolazone. Supp K.  - CO-OX 63%. Having NSVT. May need to stop milrinone - Hold off on bb.  -Renal function stable.   2. CAD -LHC with severe 3 vessel CAD  - On statin - CT surgery consulted. Planning for CABG on Friday.   3. Mitral Regurgitation - Mod-severe on ECHO -Hopefully will improve with diuresis.   4. Uncontrolled DM -Hgb A1C 9.  - On sliding scale.   5. Anemia  -Hgb 9.8   -Check anemia panel  6. NSVT -Continue amio drip.  -Keep K >4  -Keep  Mag >2   7. Neuropathy -Very limited with severe neuropathy. No sensation R and L foot and occasionaly in his hands. Requires assistance with ADLs.    8. Severe Malnutrition -Prealbumin 8.9  -Consult dietitian.    Length of Stay: 2  Darrick Grinder, NP  04/16/2019, 8:02  AM  Advanced Heart Failure Team Pager [phone] (M-F; 7a - 4p)  Please contact [facility] for night-coverage after  hours (4p -7a ) and weekends on [email or web address]  Patient seen and examined with the above-signed Advanced Practice Provider and/or Housestaff. I personally reviewed laboratory data, imaging studies and relevant notes. I independently examined the patient and formulated the important aspects of the plan. I have edited the note to reflect any of my changes or salient points. I have personally discussed the plan with the patient and/or family.  He is much improved with addition of milrinone. Co-ox normalized. CVP down to 8. Having some runs of NSVT. Now on amio gtt. No CP. No bleeding on heparin.   General:  Lying in bed  No resp difficulty HEENT: normal Neck: supple. JVP 7-8 Carotids 2+ bilat; no bruits. No lymphadenopathy or thryomegaly appreciated. Cor: PMI nondisplaced. Regular rate & rhythm. 2/6 MR  Lungs: clear Abdomen: soft, nontender, nondistended. No hepatosplenomegaly. No bruits or masses. Good bowel sounds. Extremities: no cyanosis, clubbing, rash, edema Neuro: alert & orientedx3, cranial nerves grossly intact. moves all 4 extremities w/o difficulty. Affect pleasant  Improved with milrinone. Agree with one more dose IV lasix then switch to po. Continue amio and heparin for now. Continue spiro and dig. BP too low for ARB. No b-blocker yet with low output. Keep K > 4.0 Mg > 2.0  Ambulate.   CABG Friday.  Glori Bickers, MD  12:26 PM

## 2019-04-16 NOTE — Progress Notes (Signed)
  CO-OX 49% off milrinone. Restart milrinone 0.125 mcg.   Having frequent PVCs and NSVT.   Give another 150 mg IV amio now. Continue amio drip.   Malky Rudzinski NP-C  4:34 PM

## 2019-04-16 NOTE — Progress Notes (Addendum)
ANTICOAGULATION CONSULT NOTE  Pharmacy Consult for Heparin Indication: chest pain/ACS  Heparin Dosing Weight: 68.2 kg  Labs: Recent Labs    04/11/2019 2149 04/02/2019 2149 04/01/2019 2212 04/03/2019 0113 03/20/2019 0427 03/23/2019 1501 04/06/2019 1505 03/19/2019 1505 04/02/2019 2127 03/20/2019 2213 04/16/19 0448  HGB  --   --   --  9.8*  --    < > 9.2*   < >  --  9.9* 9.8*  HCT  --   --   --  29.7*  --    < > 27.0*  --   --  29.0* 30.2*  PLT  --   --   --  261  --   --   --   --   --   --  319  APTT 65*  --   --   --   --   --   --   --   --   --   --   LABPROT 15.6*  --   --   --   --   --   --   --   --   --   --   INR 1.3*  --   --   --   --   --   --   --   --   --   --   HEPARINUNFRC  --   --  0.26*  --  0.56  --   --   --   --   --  0.24*  CREATININE 1.28*   < >  --  1.26*  --   --   --   --  0.92 0.90  --    < > = values in this interval not displayed.    Assessment: 64 yo male presenting to [facility] with malaise and SOB for 4 days found to have elevated troponin and new depressed EF. Transferred to [facility] for further evaluation. Patient was started on heparin IV on 3/26, no anticoagulation PTA.   Patient now s/p cath 3/30 showing severe 3-vessel obstructive CAD, high LV filling pressures, reduced cardiac output, and moderate pulmonary HTN. After cath patient was transfered to the ICU and place PICC for IV milrinone, considering CABG. Pharmacy consulted to resume heparin 8 hours post-sheath removal (removed at 1510 per cath procedure log).  Heparin level 0.24 on heparin 1450 units/hr is subtherapeutic. Level drawn appropriately. CBC stable - Hgb 9.8, plt 319. No reported bleeding or issues with IV infusion per RN   Goal of Therapy:  Heparin level 0.3-0.7 units/ml Monitor platelets by anticoagulation protocol: Yes   Plan:  Increase heparin to 1550 units/hr  Check heparin level at 1230 Monitor heparin level, CBC,and S/S of bleeding daily Follow up plans for CABG   Cristela Felt,  PharmD PGY1 Pharmacy Resident [employer]: [phone]  04/16/2019 5:52 AM  Addendum: Heparin level 0.61 on heparin 1550 units/hr is therapeutic. Heparin level drawn above PIV in which heparin was running but heparin was paused for 2 minutes. No reported bleeding.   Plan: - Continue heparin at 1550 units/hr  - Check heparin level at 1830 - Assess if heparin level can be obtained via another site or access

## 2019-04-16 NOTE — Progress Notes (Signed)
Paged on call cardiology to come to bedside d/t increase in NSVT.

## 2019-04-16 NOTE — Progress Notes (Signed)
Initial Nutrition Assessment  DOCUMENTATION CODES:   Severe malnutrition in context of chronic illness  INTERVENTION:   Ensure Enlive po TID, each supplement provides 350 kcal and 20 grams of protein  Snacks TID between meals  Continue MVI with Minerals  NUTRITION DIAGNOSIS:   Severe Malnutrition related to chronic illness as evidenced by severe muscle depletion, severe fat depletion.  GOAL:   Patient will meet greater than or equal to 90% of their needs  MONITOR:   PO intake, Supplement acceptance, Labs, Weight trends  REASON FOR ASSESSMENT:   Consult Poor PO, Assessment of nutrition requirement/status  ASSESSMENT:   64 yo male admitted with acute systeolic CHF, CAD with 3 vessel disease with plan for CABG. PMH includes DM with HgbA1c 9 with neuropathy, HTN  3/30 Cardiac Cath with severe 3-vessel CAD 3/31 ECHO: EF 30-35%  Noted plan for CABG on Friday  Pt reports very good appetite currently. Pt reports he ate 100% at dinner last night and 100% at breakfast this AM. However, pt reports appetite has been poor at home. Eating 1 to 1.5 snack size meals at home; not taking any oral nutrition supplements  Pt drank a Glucerna shake this AM and reports she like. Given severe malnutrition and plan for surgery later this week, plan to change to Ensure [facility] which provides more calories and protein (3 Ensure Enlive shakes is equivalent to 5 Glucerna shakes)  Pt also reports she likes to nibble throughout the day; pt agreeable to snacks between meals.   Pt reports weight has been stable; current wt 144 pounds. Pt reports UBW around 140 pounds  Labs: CBGs 194-240, sodium 134 Meds: ss novolog, novolog with meals, lantus, KCL   NUTRITION - FOCUSED PHYSICAL EXAM:    Most Recent Value  Orbital Region  Severe depletion  Buccal Region  Severe depletion  Temple Region  Severe depletion  Clavicle Bone Region  Severe depletion  Clavicle and Acromion Bone Region  Severe  depletion  Scapular Bone Region  Moderate depletion  Dorsal Hand  Moderate depletion  Patellar Region  Moderate depletion  Anterior Thigh Region  Moderate depletion  Posterior Calf Region  Moderate depletion  Edema (RD Assessment)  None       Diet Order:   Diet Order            Diet Carb Modified Fluid consistency: Thin; Room service appropriate? Yes  Diet effective now              EDUCATION NEEDS:   Education needs have been addressed  Skin:  Skin Assessment: Reviewed RN Assessment  Last BM:  no documented BM  Height:   Ht Readings from Last 1 Encounters:  03/21/2019 5\' 11"  (1.803 m)    Weight:   Wt Readings from Last 1 Encounters:  04/16/19 65.6 kg    BMI:  Body mass index is 20.17 kg/m.  Estimated Nutritional Needs:   Kcal:  2000-2200 kcals  Protein:  100-115 g  Fluid:  >/= 2 L   Kerman Passey MS, RDN, LDN, CNSC RD Pager Number and Weekend/On-Call After Hours Pager Located in [place]

## 2019-04-16 NOTE — Telephone Encounter (Signed)
Brendan [name] calls nurse line requesting to speak with PCP. Brendan [name] stated he has been transferred to [name] and she has some questions and would like PCP to call her. I advised her you would be in the office tomorrow.

## 2019-04-16 NOTE — Progress Notes (Signed)
Inpatient Diabetes Program Recommendations  AACE/ADA: New Consensus Statement on Inpatient Glycemic Control (2015)  Target Ranges:  Prepandial:   less than 140 mg/dL      Peak postprandial:   less than 180 mg/dL (1-2 hours)      Critically ill patients:  140 - 180 mg/dL   Lab Results  Component Value Date   GLUCAP 303 (H) 04/16/2019   HGBA1C 9.0 (H) 04/03/2019    Review of Glycemic Control Results for Brendan [name], Brendan [name] (MRN [id number]) as of 04/16/2019 12:23  Ref. Range 04/16/2019 01:43 04/16/2019 06:31 04/16/2019 11:41  Glucose-Capillary Latest Ref Range: 70 - 99 mg/dL 240 (H) 194 (H) 303 (H)   Diabetes history: Type 2 DM Outpatient Diabetes medications: Basaglar 28 units QHS, Humalog 8 units BID, Metformin 500 mg BID Current orders for Inpatient glycemic control: Lantus 20 units QHS, Novolog 4 units TID, Novolog 0-15 units TID  Inpatient Diabetes Program Recommendations:    Consider increasing Lantus to 28 units QHS and increasing Novolog 8 units TID (assuming patient is consuming >50% of meal).   Spoke with patient regarding outpatient diabetes management. Patient obtains medications for [facility] cares assistance program through a "diabetes doctor" through [facility] medicine. Admits to missing doses, although states, "Now I gotta do what they tell me." Reviewed patient's current A1c of 9.0%. Explained what a A1c is and what it measures. Also reviewed goal A1c with patient, importance of good glucose control @ home, and blood sugar goals. Reviewed patho of DM, need for insulin and taking as prescribed, impact of poor glucose control on heart and vascular, and other commorbidities.  Patient has a meter and supplies and reports checking 2 times per day.  Denies drinking sugary beverages, however states, "there are still things I could limit." Encouraged mindfulness on carbohydrate intake and ensuring selecting alternatives. Patient has a follow up with PCP and plans to go. Doesn't have any further  questions at this time.   Secure chat sent to [name] with recs.   Thanks, [name], MSN, RNC-OB Diabetes Coordinator [phone] (8a-5p)

## 2019-04-17 ENCOUNTER — Encounter (HOSPITAL_COMMUNITY)
Admission: AD | Disposition: E | Payer: Self-pay | Source: Other Acute Inpatient Hospital | Attending: Cardiothoracic Surgery

## 2019-04-17 ENCOUNTER — Inpatient Hospital Stay (HOSPITAL_COMMUNITY): Payer: Medicaid Other

## 2019-04-17 DIAGNOSIS — I25110 Atherosclerotic heart disease of native coronary artery with unstable angina pectoris: Secondary | ICD-10-CM

## 2019-04-17 DIAGNOSIS — I340 Nonrheumatic mitral (valve) insufficiency: Secondary | ICD-10-CM

## 2019-04-17 DIAGNOSIS — Z01818 Encounter for other preprocedural examination: Secondary | ICD-10-CM

## 2019-04-17 DIAGNOSIS — I501 Left ventricular failure: Secondary | ICD-10-CM

## 2019-04-17 DIAGNOSIS — I2510 Atherosclerotic heart disease of native coronary artery without angina pectoris: Secondary | ICD-10-CM

## 2019-04-17 HISTORY — PX: RIGHT HEART CATH: CATH118263

## 2019-04-17 HISTORY — PX: IABP INSERTION: CATH118242

## 2019-04-17 LAB — POCT I-STAT 7, (LYTES, BLD GAS, ICA,H+H)
Acid-Base Excess: 1 mmol/L (ref 0.0–2.0)
Bicarbonate: 24.3 mmol/L (ref 20.0–28.0)
Calcium, Ion: 1.29 mmol/L (ref 1.15–1.40)
HCT: 30 % — ABNORMAL LOW (ref 39.0–52.0)
Hemoglobin: 10.2 g/dL — ABNORMAL LOW (ref 13.0–17.0)
O2 Saturation: 99 %
Patient temperature: 36.8
Potassium: 4.9 mmol/L (ref 3.5–5.1)
Sodium: 132 mmol/L — ABNORMAL LOW (ref 135–145)
TCO2: 25 mmol/L (ref 22–32)
pCO2 arterial: 33.9 mmHg (ref 32.0–48.0)
pH, Arterial: 7.462 — ABNORMAL HIGH (ref 7.350–7.450)
pO2, Arterial: 119 mmHg — ABNORMAL HIGH (ref 83.0–108.0)

## 2019-04-17 LAB — CBC
HCT: 30.1 % — ABNORMAL LOW (ref 39.0–52.0)
HCT: 29.3 % — ABNORMAL LOW (ref 39.0–52.0)
Hemoglobin: 9.9 g/dL — ABNORMAL LOW (ref 13.0–17.0)
Hemoglobin: 9.7 g/dL — ABNORMAL LOW (ref 13.0–17.0)
MCH: 28.6 pg (ref 26.0–34.0)
MCH: 28.8 pg (ref 26.0–34.0)
MCHC: 32.9 g/dL (ref 30.0–36.0)
MCHC: 33.1 g/dL (ref 30.0–36.0)
MCV: 87 fL (ref 80.0–100.0)
MCV: 86.9 fL (ref 80.0–100.0)
Platelets: 282 10*3/uL (ref 150–400)
Platelets: 261 10*3/uL (ref 150–400)
RBC: 3.46 MIL/uL — ABNORMAL LOW (ref 4.22–5.81)
RBC: 3.37 MIL/uL — ABNORMAL LOW (ref 4.22–5.81)
RDW: 12.9 % (ref 11.5–15.5)
RDW: 13 % (ref 11.5–15.5)
WBC: 9.4 10*3/uL (ref 4.0–10.5)
WBC: 11.2 10*3/uL — ABNORMAL HIGH (ref 4.0–10.5)
nRBC: 0 % (ref 0.0–0.2)
nRBC: 0 % (ref 0.0–0.2)

## 2019-04-17 LAB — POCT I-STAT EG7
Acid-base deficit: 3 mmol/L — ABNORMAL HIGH (ref 0.0–2.0)
Acid-base deficit: 2 mmol/L (ref 0.0–2.0)
Bicarbonate: 21.4 mmol/L (ref 20.0–28.0)
Bicarbonate: 23.2 mmol/L (ref 20.0–28.0)
Calcium, Ion: 1.15 mmol/L (ref 1.15–1.40)
Calcium, Ion: 1.27 mmol/L (ref 1.15–1.40)
HCT: 27 % — ABNORMAL LOW (ref 39.0–52.0)
HCT: 30 % — ABNORMAL LOW (ref 39.0–52.0)
Hemoglobin: 9.2 g/dL — ABNORMAL LOW (ref 13.0–17.0)
Hemoglobin: 10.2 g/dL — ABNORMAL LOW (ref 13.0–17.0)
O2 Saturation: 48 %
O2 Saturation: 47 %
Potassium: 4.8 mmol/L (ref 3.5–5.1)
Potassium: 5.3 mmol/L — ABNORMAL HIGH (ref 3.5–5.1)
Sodium: 136 mmol/L (ref 135–145)
Sodium: 132 mmol/L — ABNORMAL LOW (ref 135–145)
TCO2: 22 mmol/L (ref 22–32)
TCO2: 24 mmol/L (ref 22–32)
pCO2, Ven: 36.8 mmHg — ABNORMAL LOW (ref 44.0–60.0)
pCO2, Ven: 39.6 mmHg — ABNORMAL LOW (ref 44.0–60.0)
pH, Ven: 7.372 (ref 7.250–7.430)
pH, Ven: 7.376 (ref 7.250–7.430)
pO2, Ven: 26 mmHg — CL (ref 32.0–45.0)
pO2, Ven: 26 mmHg — CL (ref 32.0–45.0)

## 2019-04-17 LAB — COOXEMETRY PANEL
Carboxyhemoglobin: 0.9 % (ref 0.5–1.5)
Carboxyhemoglobin: 1.1 % (ref 0.5–1.5)
Carboxyhemoglobin: 1.2 % (ref 0.5–1.5)
Carboxyhemoglobin: 1.2 % (ref 0.5–1.5)
Methemoglobin: 0.5 % (ref 0.0–1.5)
Methemoglobin: 1.1 % (ref 0.0–1.5)
Methemoglobin: 1.2 % (ref 0.0–1.5)
Methemoglobin: 1.2 % (ref 0.0–1.5)
O2 Saturation: 47.2 %
O2 Saturation: 43.2 %
O2 Saturation: 45.4 %
O2 Saturation: 56.3 %
Total hemoglobin: 9.6 g/dL — ABNORMAL LOW (ref 12.0–16.0)
Total hemoglobin: 10.1 g/dL — ABNORMAL LOW (ref 12.0–16.0)
Total hemoglobin: 9.8 g/dL — ABNORMAL LOW (ref 12.0–16.0)
Total hemoglobin: 10.2 g/dL — ABNORMAL LOW (ref 12.0–16.0)

## 2019-04-17 LAB — GLUCOSE, CAPILLARY
Glucose-Capillary: 188 mg/dL — ABNORMAL HIGH (ref 70–99)
Glucose-Capillary: 263 mg/dL — ABNORMAL HIGH (ref 70–99)
Glucose-Capillary: 394 mg/dL — ABNORMAL HIGH (ref 70–99)
Glucose-Capillary: 51 mg/dL — ABNORMAL LOW (ref 70–99)
Glucose-Capillary: 53 mg/dL — ABNORMAL LOW (ref 70–99)

## 2019-04-17 LAB — BASIC METABOLIC PANEL
Anion gap: 14 (ref 5–15)
Anion gap: 11 (ref 5–15)
BUN: 38 mg/dL — ABNORMAL HIGH (ref 8–23)
BUN: 45 mg/dL — ABNORMAL HIGH (ref 8–23)
CO2: 23 mmol/L (ref 22–32)
CO2: 24 mmol/L (ref 22–32)
Calcium: 8.5 mg/dL — ABNORMAL LOW (ref 8.9–10.3)
Calcium: 9 mg/dL (ref 8.9–10.3)
Chloride: 94 mmol/L — ABNORMAL LOW (ref 98–111)
Chloride: 98 mmol/L (ref 98–111)
Creatinine, Ser: 1.13 mg/dL (ref 0.61–1.24)
Creatinine, Ser: 1.56 mg/dL — ABNORMAL HIGH (ref 0.61–1.24)
GFR calc Af Amer: >60 mL/min (ref >60)
GFR calc Af Amer: 55 mL/min — ABNORMAL LOW (ref >60)
GFR calc non Af Amer: >60 mL/min (ref >60)
GFR calc non Af Amer: 47 mL/min — ABNORMAL LOW (ref >60)
Glucose, Bld: 412 mg/dL — ABNORMAL HIGH (ref 70–99)
Glucose, Bld: 158 mg/dL — ABNORMAL HIGH (ref 70–99)
Potassium: 4.8 mmol/L (ref 3.5–5.1)
Potassium: 4.9 mmol/L (ref 3.5–5.1)
Sodium: 131 mmol/L — ABNORMAL LOW (ref 135–145)
Sodium: 133 mmol/L — ABNORMAL LOW (ref 135–145)

## 2019-04-17 LAB — HEPARIN LEVEL (UNFRACTIONATED)
Heparin Unfractionated: 0.61 IU/mL (ref 0.30–0.70)
Heparin Unfractionated: 0.52 IU/mL (ref 0.30–0.70)

## 2019-04-17 LAB — SURGICAL PCR SCREEN
MRSA, PCR: NEGATIVE
Staphylococcus aureus: NEGATIVE

## 2019-04-17 LAB — LACTIC ACID, PLASMA: Lactic Acid, Venous: 2.2 mmol/L (ref 0.5–1.9)

## 2019-04-17 LAB — PREPARE RBC (CROSSMATCH)

## 2019-04-17 LAB — ABO/RH: ABO/RH(D): O NEG

## 2019-04-17 LAB — MAGNESIUM: Magnesium: 2.2 mg/dL (ref 1.7–2.4)

## 2019-04-17 SURGERY — RIGHT HEART CATH
Anesthesia: LOCAL

## 2019-04-17 MED ORDER — EPINEPHRINE HCL 5 MG/250ML IV SOLN IN NS
0.0000 ug/min | INTRAVENOUS | Status: DC
  Filled 2019-04-17: qty 250

## 2019-04-17 MED ORDER — CHLORHEXIDINE GLUCONATE 0.12 % MT SOLN
15.0000 mL | Freq: Once | OROMUCOSAL | Status: AC
  Administered 2019-04-18: 15 mL via OROMUCOSAL
  Filled 2019-04-17: qty 15

## 2019-04-17 MED ORDER — ASPIRIN 81 MG PO CHEW: 81.0000 mg | CHEWABLE_TABLET | ORAL | Status: DC

## 2019-04-17 MED ORDER — TRANEXAMIC ACID 1000 MG/10ML IV SOLN
1.5000 mg/kg/h | INTRAVENOUS | Status: DC
  Filled 2019-04-17: qty 25

## 2019-04-17 MED ORDER — SODIUM CHLORIDE 0.9 % IV SOLN
1.5000 g | INTRAVENOUS | Status: DC
  Filled 2019-04-17: qty 1.5

## 2019-04-17 MED ORDER — INSULIN ASPART 100 UNIT/ML ~~LOC~~ SOLN
0.0000 [IU] | Freq: Three times a day (TID) | SUBCUTANEOUS | Status: DC
  Administered 2019-04-17: 17:00:00 4 [IU] via SUBCUTANEOUS
  Administered 2019-04-17: 20 [IU] via SUBCUTANEOUS

## 2019-04-17 MED ORDER — SODIUM CHLORIDE 0.9 % IV SOLN
INTRAVENOUS | Status: AC | PRN
  Administered 2019-04-17: 10 mL/h via INTRAVENOUS

## 2019-04-17 MED ORDER — LIDOCAINE HCL (PF) 1 % IJ SOLN
INTRAMUSCULAR | Status: AC
  Filled 2019-04-17: qty 30

## 2019-04-17 MED ORDER — SODIUM CHLORIDE 0.9 % IV SOLN
INTRAVENOUS | Status: DC
  Filled 2019-04-17: qty 30

## 2019-04-17 MED ORDER — HYDRALAZINE HCL 20 MG/ML IJ SOLN: 10.0000 mg | INTRAMUSCULAR | Status: AC | PRN

## 2019-04-17 MED ORDER — LIDOCAINE HCL (PF) 1 % IJ SOLN
INTRAMUSCULAR | Status: DC | PRN
  Administered 2019-04-17: 5 mL via INTRADERMAL
  Administered 2019-04-17: 18 mL via INTRADERMAL

## 2019-04-17 MED ORDER — MIDAZOLAM HCL 2 MG/2ML IJ SOLN
INTRAMUSCULAR | Status: AC
  Filled 2019-04-17: qty 2

## 2019-04-17 MED ORDER — HYDRALAZINE HCL 20 MG/ML IJ SOLN
10.0000 mg | INTRAMUSCULAR | Status: DC | PRN
  Filled 2019-04-17: qty 1

## 2019-04-17 MED ORDER — TRANEXAMIC ACID (OHS) PUMP PRIME SOLUTION
2.0000 mg/kg | INTRAVENOUS | Status: DC
  Filled 2019-04-17: qty 1.37

## 2019-04-17 MED ORDER — IOHEXOL 350 MG/ML SOLN
INTRAVENOUS | Status: DC | PRN
  Administered 2019-04-17: 10 mL via INTRA_ARTERIAL

## 2019-04-17 MED ORDER — SODIUM CHLORIDE 0.9 % IV SOLN
1.5000 g | INTRAVENOUS | Status: AC
  Administered 2019-04-18: 1.5 g via INTRAVENOUS
  Filled 2019-04-17: qty 1.5

## 2019-04-17 MED ORDER — POTASSIUM CHLORIDE 2 MEQ/ML IV SOLN
80.0000 meq | INTRAVENOUS | Status: DC
  Filled 2019-04-17: qty 40

## 2019-04-17 MED ORDER — INSULIN ASPART 100 UNIT/ML ~~LOC~~ SOLN: 0.0000 [IU] | SUBCUTANEOUS | Status: DC

## 2019-04-17 MED ORDER — ROCURONIUM BROMIDE 50 MG/5ML IV SOLN
100.0000 mg | Freq: Once | INTRAVENOUS | Status: AC
  Administered 2019-04-17: 100 mg via INTRAVENOUS

## 2019-04-17 MED ORDER — ONDANSETRON HCL 4 MG/2ML IJ SOLN: 4.0000 mg | Freq: Four times a day (QID) | INTRAMUSCULAR | Status: DC | PRN

## 2019-04-17 MED ORDER — FENTANYL CITRATE (PF) 100 MCG/2ML IJ SOLN
INTRAMUSCULAR | Status: AC
  Administered 2019-04-17: 100 ug
  Filled 2019-04-17: qty 2

## 2019-04-17 MED ORDER — MILRINONE LACTATE IN DEXTROSE 20-5 MG/100ML-% IV SOLN
0.1250 ug/kg/min | INTRAVENOUS | Status: DC
  Administered 2019-04-17: 0.125 ug/kg/min via INTRAVENOUS
  Filled 2019-04-17: qty 100

## 2019-04-17 MED ORDER — HEPARIN SODIUM (PORCINE) 1000 UNIT/ML IJ SOLN
INTRAMUSCULAR | Status: AC
  Filled 2019-04-17: qty 1

## 2019-04-17 MED ORDER — NOREPINEPHRINE 4 MG/250ML-% IV SOLN
0.0000 ug/min | INTRAVENOUS | Status: DC
  Filled 2019-04-17: qty 250

## 2019-04-17 MED ORDER — CHLORHEXIDINE GLUCONATE CLOTH 2 % EX PADS
6.0000 | MEDICATED_PAD | Freq: Once | CUTANEOUS | Status: AC
  Administered 2019-04-18: 6 via TOPICAL

## 2019-04-17 MED ORDER — SODIUM CHLORIDE 0.9% FLUSH
3.0000 mL | Freq: Two times a day (BID) | INTRAVENOUS | Status: DC
  Administered 2019-04-19 – 2019-04-20 (×2): 3 mL via INTRAVENOUS

## 2019-04-17 MED ORDER — FENTANYL CITRATE (PF) 100 MCG/2ML IJ SOLN: 50.0000 ug | INTRAMUSCULAR | Status: DC | PRN

## 2019-04-17 MED ORDER — FENTANYL CITRATE (PF) 100 MCG/2ML IJ SOLN: 100.0000 ug | Freq: Once | INTRAMUSCULAR | Status: DC

## 2019-04-17 MED ORDER — OXYCODONE HCL 5 MG/5ML PO SOLN
10.0000 mg | Freq: Four times a day (QID) | ORAL | Status: DC
  Administered 2019-04-17 – 2019-04-20 (×8): 10 mg
  Filled 2019-04-17 (×9): qty 10

## 2019-04-17 MED ORDER — PLASMA-LYTE 148 IV SOLN
INTRAVENOUS | Status: DC
  Filled 2019-04-17: qty 2.5

## 2019-04-17 MED ORDER — MAGNESIUM SULFATE 50 % IJ SOLN
40.0000 meq | INTRAMUSCULAR | Status: DC
  Filled 2019-04-17: qty 9.85

## 2019-04-17 MED ORDER — FENTANYL CITRATE (PF) 100 MCG/2ML IJ SOLN
INTRAMUSCULAR | Status: DC | PRN
  Administered 2019-04-17: 25 ug via INTRAVENOUS

## 2019-04-17 MED ORDER — MIDAZOLAM HCL 2 MG/2ML IJ SOLN
2.0000 mg | INTRAMUSCULAR | Status: DC | PRN
  Administered 2019-04-18: 2 mg via INTRAVENOUS

## 2019-04-17 MED ORDER — HYDRALAZINE HCL 20 MG/ML IJ SOLN: 10.0000 mg | INTRAMUSCULAR | Status: DC | PRN

## 2019-04-17 MED ORDER — ETOMIDATE 2 MG/ML IV SOLN
20.0000 mg | Freq: Once | INTRAVENOUS | Status: AC
  Administered 2019-04-17: 22:00:00 20 mg via INTRAVENOUS

## 2019-04-17 MED ORDER — VANCOMYCIN HCL 1250 MG/250ML IV SOLN
1250.0000 mg | INTRAVENOUS | Status: DC
  Filled 2019-04-17: qty 250

## 2019-04-17 MED ORDER — SODIUM CHLORIDE 0.9 % IV SOLN
750.0000 mg | INTRAVENOUS | Status: DC
  Filled 2019-04-17: qty 750

## 2019-04-17 MED ORDER — DEXMEDETOMIDINE HCL IN NACL 400 MCG/100ML IV SOLN
0.1000 ug/kg/h | INTRAVENOUS | Status: DC
  Filled 2019-04-17: qty 100

## 2019-04-17 MED ORDER — MIDAZOLAM HCL 2 MG/2ML IJ SOLN
INTRAMUSCULAR | Status: DC | PRN
  Administered 2019-04-17: 1 mg via INTRAVENOUS

## 2019-04-17 MED ORDER — TRANEXAMIC ACID (OHS) BOLUS VIA INFUSION
15.0000 mg/kg | INTRAVENOUS | Status: DC
  Filled 2019-04-17: qty 1026

## 2019-04-17 MED ORDER — PHENYLEPHRINE HCL-NACL 20-0.9 MG/250ML-% IV SOLN
30.0000 ug/min | INTRAVENOUS | Status: DC
  Filled 2019-04-17: qty 250

## 2019-04-17 MED ORDER — INSULIN REGULAR(HUMAN) IN NACL 100-0.9 UT/100ML-% IV SOLN
INTRAVENOUS | Status: DC
  Filled 2019-04-17: qty 100

## 2019-04-17 MED ORDER — LORAZEPAM 2 MG/ML IJ SOLN
1.0000 mg | INTRAMUSCULAR | Status: DC | PRN
  Administered 2019-04-17 (×2): 1 mg via INTRAVENOUS
  Filled 2019-04-17 (×2): qty 1

## 2019-04-17 MED ORDER — FENTANYL BOLUS VIA INFUSION
50.0000 ug | INTRAVENOUS | Status: DC | PRN
  Administered 2019-04-18: 50 ug via INTRAVENOUS
  Filled 2019-04-17: qty 50

## 2019-04-17 MED ORDER — MILRINONE LACTATE IN DEXTROSE 20-5 MG/100ML-% IV SOLN
0.3000 ug/kg/min | INTRAVENOUS | Status: DC
  Filled 2019-04-17: qty 100

## 2019-04-17 MED ORDER — FENTANYL CITRATE (PF) 100 MCG/2ML IJ SOLN
50.0000 ug | Freq: Once | INTRAMUSCULAR | Status: AC
  Administered 2019-04-17: 50 ug via INTRAVENOUS

## 2019-04-17 MED ORDER — MIDAZOLAM HCL 2 MG/2ML IJ SOLN: 1.0000 mg | INTRAMUSCULAR | Status: DC | PRN

## 2019-04-17 MED ORDER — HEPARIN (PORCINE) IN NACL 1000-0.9 UT/500ML-% IV SOLN
INTRAVENOUS | Status: AC
  Filled 2019-04-17: qty 500

## 2019-04-17 MED ORDER — SODIUM CHLORIDE 0.9% FLUSH
3.0000 mL | INTRAVENOUS | Status: DC | PRN
  Administered 2019-04-20: 3 mL via INTRAVENOUS

## 2019-04-17 MED ORDER — LIDOCAINE IN D5W 4-5 MG/ML-% IV SOLN: 1.0000 mg/min | INTRAVENOUS | Status: DC

## 2019-04-17 MED ORDER — FUROSEMIDE 10 MG/ML IJ SOLN
20.0000 mg/h | INTRAVENOUS | Status: DC
  Administered 2019-04-17: 20 mg/h via INTRAVENOUS
  Filled 2019-04-17 (×3): qty 25

## 2019-04-17 MED ORDER — DOCUSATE SODIUM 50 MG/5ML PO LIQD: 100.0000 mg | Freq: Two times a day (BID) | ORAL | Status: DC | PRN

## 2019-04-17 MED ORDER — FUROSEMIDE 10 MG/ML IJ SOLN
INTRAMUSCULAR | Status: AC
  Filled 2019-04-17: qty 8

## 2019-04-17 MED ORDER — ORAL CARE MOUTH RINSE
15.0000 mL | OROMUCOSAL | Status: DC
  Administered 2019-04-17 – 2019-04-20 (×27): 15 mL via OROMUCOSAL

## 2019-04-17 MED ORDER — DEXMEDETOMIDINE HCL IN NACL 400 MCG/100ML IV SOLN
0.0000 ug/kg/h | INTRAVENOUS | Status: DC
  Administered 2019-04-17: 0.1 ug/kg/h via INTRAVENOUS
  Filled 2019-04-17: qty 100

## 2019-04-17 MED ORDER — MIDAZOLAM HCL 2 MG/2ML IJ SOLN: 2.0000 mg | INTRAMUSCULAR | Status: DC | PRN

## 2019-04-17 MED ORDER — DEXTROSE 50 % IV SOLN: 25.0000 g | INTRAVENOUS | Status: AC

## 2019-04-17 MED ORDER — DEXTROSE 50 % IV SOLN
INTRAVENOUS | Status: AC
  Administered 2019-04-17: 25 g via INTRAVENOUS
  Filled 2019-04-17: qty 50

## 2019-04-17 MED ORDER — HEPARIN (PORCINE) IN NACL 1000-0.9 UT/500ML-% IV SOLN
INTRAVENOUS | Status: DC | PRN
  Administered 2019-04-17 (×2): 500 mL

## 2019-04-17 MED ORDER — NITROGLYCERIN IN D5W 200-5 MCG/ML-% IV SOLN
2.0000 ug/min | INTRAVENOUS | Status: DC
  Filled 2019-04-17: qty 250

## 2019-04-17 MED ORDER — PANTOPRAZOLE SODIUM 40 MG IV SOLR
40.0000 mg | INTRAVENOUS | Status: DC
  Administered 2019-04-17 – 2019-04-20 (×4): 40 mg via INTRAVENOUS
  Filled 2019-04-17 (×4): qty 40

## 2019-04-17 MED ORDER — FENTANYL 2500MCG IN NS 250ML (10MCG/ML) PREMIX INFUSION
50.0000 ug/h | INTRAVENOUS | Status: DC
  Administered 2019-04-17: 50 ug/h via INTRAVENOUS
  Administered 2019-04-18: 150 ug/h via INTRAVENOUS
  Administered 2019-04-19: 50 ug/h via INTRAVENOUS
  Filled 2019-04-17 (×3): qty 250

## 2019-04-17 MED ORDER — CHLORHEXIDINE GLUCONATE 0.12% ORAL RINSE (MEDLINE KIT)
15.0000 mL | Freq: Two times a day (BID) | OROMUCOSAL | Status: DC
  Administered 2019-04-17 – 2019-04-19 (×4): 15 mL via OROMUCOSAL

## 2019-04-17 MED ORDER — LABETALOL HCL 5 MG/ML IV SOLN: 10.0000 mg | INTRAVENOUS | Status: AC | PRN

## 2019-04-17 MED ORDER — SODIUM CHLORIDE 0.9 % IV SOLN: 250.0000 mL | INTRAVENOUS | Status: DC | PRN

## 2019-04-17 MED ORDER — INSULIN ASPART 100 UNIT/ML ~~LOC~~ SOLN: 0.0000 [IU] | Freq: Every day | SUBCUTANEOUS | Status: DC

## 2019-04-17 MED ORDER — BISACODYL 10 MG RE SUPP: 10.0000 mg | Freq: Every day | RECTAL | Status: DC | PRN

## 2019-04-17 MED ORDER — FUROSEMIDE 10 MG/ML IJ SOLN
INTRAMUSCULAR | Status: DC | PRN
  Administered 2019-04-17: 80 mg via INTRAVENOUS

## 2019-04-17 MED ORDER — FUROSEMIDE 10 MG/ML IJ SOLN
80.0000 mg | Freq: Two times a day (BID) | INTRAMUSCULAR | Status: DC
  Administered 2019-04-17: 80 mg via INTRAVENOUS
  Filled 2019-04-17 (×2): qty 8

## 2019-04-17 MED ORDER — ACETAMINOPHEN 325 MG PO TABS
650.0000 mg | ORAL_TABLET | ORAL | Status: DC | PRN
  Administered 2019-04-19: 650 mg via ORAL
  Filled 2019-04-17: qty 2

## 2019-04-17 MED ORDER — METOLAZONE 5 MG PO TABS
5.0000 mg | ORAL_TABLET | Freq: Once | ORAL | Status: AC
  Administered 2019-04-17: 5 mg via ORAL
  Filled 2019-04-17: qty 1

## 2019-04-17 MED ORDER — FENTANYL CITRATE (PF) 100 MCG/2ML IJ SOLN
50.0000 ug | INTRAMUSCULAR | Status: DC | PRN
  Administered 2019-04-18 (×2): 50 ug via INTRAVENOUS

## 2019-04-17 MED ORDER — ADULT MULTIVITAMIN W/MINERALS CH
1.0000 | ORAL_TABLET | Freq: Every day | ORAL | Status: DC
  Administered 2019-04-19: 1
  Filled 2019-04-17: qty 1

## 2019-04-17 MED ORDER — SODIUM CHLORIDE 0.9 % IV SOLN: INTRAVENOUS | Status: DC

## 2019-04-17 MED ORDER — FUROSEMIDE 10 MG/ML IJ SOLN
80.0000 mg | Freq: Once | INTRAMUSCULAR | Status: AC
  Administered 2019-04-17: 80 mg via INTRAVENOUS

## 2019-04-17 MED ORDER — INSULIN GLARGINE 100 UNIT/ML ~~LOC~~ SOLN
18.0000 [IU] | Freq: Two times a day (BID) | SUBCUTANEOUS | Status: DC
  Administered 2019-04-17 (×2): 18 [IU] via SUBCUTANEOUS
  Filled 2019-04-17 (×7): qty 0.18

## 2019-04-17 MED ORDER — ALPRAZOLAM 0.25 MG PO TABS
0.2500 mg | ORAL_TABLET | Freq: Two times a day (BID) | ORAL | Status: DC | PRN
  Administered 2019-04-17: 12:00:00 0.5 mg via ORAL
  Filled 2019-04-17: qty 2

## 2019-04-17 MED ORDER — HEPARIN SODIUM (PORCINE) 1000 UNIT/ML IJ SOLN
INTRAMUSCULAR | Status: DC | PRN
  Administered 2019-04-17: 3000 [IU] via INTRAVENOUS

## 2019-04-17 MED ORDER — INSULIN REGULAR(HUMAN) IN NACL 100-0.9 UT/100ML-% IV SOLN
INTRAVENOUS | Status: AC
  Administered 2019-04-18: 1.7 [IU]/h via INTRAVENOUS
  Filled 2019-04-17: qty 100

## 2019-04-17 MED ORDER — CHLORHEXIDINE GLUCONATE CLOTH 2 % EX PADS
6.0000 | MEDICATED_PAD | Freq: Once | CUTANEOUS | Status: AC
  Administered 2019-04-17: 6 via TOPICAL

## 2019-04-17 MED ORDER — SODIUM CHLORIDE 0.9% FLUSH: 3.0000 mL | INTRAVENOUS | Status: DC | PRN

## 2019-04-17 MED ORDER — TEMAZEPAM 15 MG PO CAPS: 15.0000 mg | ORAL_CAPSULE | Freq: Once | ORAL | Status: DC | PRN

## 2019-04-17 MED ORDER — GABAPENTIN 250 MG/5ML PO SOLN
100.0000 mg | Freq: Three times a day (TID) | ORAL | Status: DC
  Administered 2019-04-17 – 2019-04-20 (×7): 100 mg
  Filled 2019-04-17 (×11): qty 2

## 2019-04-17 MED ORDER — FENTANYL CITRATE (PF) 100 MCG/2ML IJ SOLN
INTRAMUSCULAR | Status: AC
  Filled 2019-04-17: qty 2

## 2019-04-17 MED ORDER — MILRINONE LACTATE IN DEXTROSE 20-5 MG/100ML-% IV SOLN
0.2500 ug/kg/min | INTRAVENOUS | Status: DC
  Administered 2019-04-18 – 2019-04-21 (×6): 0.25 ug/kg/min via INTRAVENOUS
  Filled 2019-04-17 (×7): qty 100

## 2019-04-17 MED ORDER — BISACODYL 5 MG PO TBEC
5.0000 mg | DELAYED_RELEASE_TABLET | Freq: Once | ORAL | Status: AC
  Administered 2019-04-17: 5 mg via ORAL
  Filled 2019-04-17: qty 1

## 2019-04-17 SURGICAL SUPPLY — 14 items
BALLN IABP SENSA PLUS 8F 50CC (BALLOONS) ×2
BALLOON IABP SENS PLUS 8F 50CC (BALLOONS) IMPLANT
CATH SWAN GANZ VIP 7.5F (CATHETERS) ×1 IMPLANT
KIT ESSENTIALS PG (KITS) ×2 IMPLANT
KIT HEART LEFT (KITS) ×2 IMPLANT
PACK CARDIAC CATHETERIZATION (CUSTOM PROCEDURE TRAY) ×2 IMPLANT
PROTECTION STATION PRESSURIZED (MISCELLANEOUS) ×2
SHEATH PINNACLE 5F 10CM (SHEATH) ×1 IMPLANT
SHEATH PINNACLE 8F 10CM (SHEATH) ×1 IMPLANT
SHEATH PROBE COVER 6X72 (BAG) ×1 IMPLANT
SLEEVE REPOSITIONING LENGTH 30 (MISCELLANEOUS) ×1 IMPLANT
STATION PROTECTION PRESSURIZED (MISCELLANEOUS) IMPLANT
TRANSDUCER W/STOPCOCK (MISCELLANEOUS) ×2 IMPLANT
WIRE EMERALD 3MM-J .035X150CM (WIRE) ×1 IMPLANT

## 2019-04-17 NOTE — Progress Notes (Signed)
Dr Haroldine Laws at bedside, assessing pt. Reviewed pt condition and plan.   Orders to start lasix gtt @ 20 mg/hr and increase Milrinone gtt to 0.25 mcg/kg/min. Leave amio gtt at 60 mcg/hr.   Watching pt's respiratory status closely, MD spoke to pt about possibility of needing breathing tube, pt agreeable.

## 2019-04-17 NOTE — Progress Notes (Signed)
Dr Haroldine Laws made aware of urine turning pink tinged color. CXR ordered.

## 2019-04-17 NOTE — Progress Notes (Addendum)
Advanced Heart Failure Rounding Note  PCP-Cardiologist: No primary care provider on file.   Subjective:   Cath 3/30 with severe 3 vessel disease.   Overnight off milrinone due to ongoing NSVT. Started on amio drip + lidocaine drip. Lidocaine increased to 3 mg per hour.   Denies SOB. Denies chest pain.   Objective:   Weight Range: 68.4 kg Body mass index is 21.03 kg/m.   Vital Signs:   Temp:  [98.2 F (36.8 C)-98.5 F (36.9 C)] 98.2 F (36.8 C) (04/01 0308) Pulse Rate:  [45-112] 48 (04/01 0630) Resp:  [17-24] 22 (04/01 0702) BP: (78-110)/(55-86) 90/65 (04/01 0702) SpO2:  [76 %-97 %] 88 % (04/01 0630) Weight:  [68.4 kg] 68.4 kg (04/01 0452) Last BM Date: 04/08/2019  Weight change: Filed Weights   03/20/2019 0519 04/16/19 0600 04/23/2019 0452  Weight: 67.5 kg 65.6 kg 68.4 kg    Intake/Output:   Intake/Output Summary (Last 24 hours) at 05/07/2019 0728 Last data filed at 04/26/2019 0700 Gross per 24 hour  Intake 1872.2 ml  Output 1900 ml  Net -27.8 ml      Physical Exam  CVP 14.  General:  Thin. No resp difficulty HEENT: normal Neck: supple. JVP to jaw . Carotids 2+ bilat; no bruits. No lymphadenopathy or thryomegaly appreciated. Cor: PMI nondisplaced. Regular rate & rhythm. No rubs, gallops or murmurs. Lungs: clear Abdomen: soft, nontender, nondistended. No hepatosplenomegaly. No bruits or masses. Good bowel sounds. Extremities: no cyanosis, clubbing, rash, edema Neuro: alert & orientedx3, cranial nerves grossly intact. moves all 4 extremities w/o difficulty. Affect pleasant  Telemetry   SR with frequent NSVT/PVCs   Labs    CBC Recent Labs    04/16/19 0448 05/14/2019 0311  WBC 9.0 9.4  HGB 9.8* 9.9*  HCT 30.2* 30.1*  MCV 87.5 87.0  PLT 319 798   Basic Metabolic Panel Recent Labs    04/16/19 0448 04/16/19 0448 04/16/19 1225 04/30/2019 0311  NA 134*   < > 134* 131*  K 3.6   < > 4.1 4.8  CL 101   < > 97* 94*  CO2 23   < > 26 23  GLUCOSE 215*   < >  375* 412*  BUN 34*   < > 36* 38*  CREATININE 0.99   < > 1.17 1.13  CALCIUM 8.2*   < > 8.7* 8.5*  MG 2.6*  --   --  2.2   < > = values in this interval not displayed.   Liver Function Tests Recent Labs    04/08/2019 2149 04/16/19 0448  AST 24 17  ALT 22 18  ALKPHOS 89 83  BILITOT 0.8 0.6  PROT 5.8* 5.3*  ALBUMIN 2.7* 2.5*   No results for input(s): LIPASE, AMYLASE in the last 72 hours. Cardiac Enzymes No results for input(s): CKTOTAL, CKMB, CKMBINDEX, TROPONINI in the last 72 hours.  BNP: BNP (last 3 results) Recent Labs    04/03/2019 0113  BNP 655.7*    ProBNP (last 3 results) No results for input(s): PROBNP in the last 8760 hours.   D-Dimer No results for input(s): DDIMER in the last 72 hours. Hemoglobin A1C Recent Labs    04/09/2019 2149  HGBA1C 9.0*   Fasting Lipid Panel No results for input(s): CHOL, HDL, LDLCALC, TRIG, CHOLHDL, LDLDIRECT in the last 72 hours. Thyroid Function Tests Recent Labs    04/11/2019 0113  TSH 0.925    Other results:   Imaging    VAS US DOPPLER  PRE CABG  Result Date: 04/16/2019 PREOPERATIVE VASCULAR EVALUATION  Indications:      Pre-CABG. Risk Factors:     Hypertension, Diabetes. Other Factors:    Tobacco use: snuff and chew. Comparison Study: No prior study Performing Technologist: Maudry Mayhew MHA, RDMS, RVT, RDCS  Examination Guidelines: A complete evaluation includes B-mode imaging, spectral Doppler, color Doppler, and power Doppler as needed of all accessible portions of each vessel. Bilateral testing is considered an integral part of a complete examination. Limited examinations for reoccurring indications may be performed as noted.  Right Carotid Findings: +----------+-------+-------+--------+---------------------------------+--------+           PSV    EDV    StenosisDescribe                         Comments           cm/s   cm/s                                                      +----------+-------+-------+--------+---------------------------------+--------+ CCA Prox  69     12                                                       +----------+-------+-------+--------+---------------------------------+--------+ CCA Distal86     16             heterogenous and irregular                +----------+-------+-------+--------+---------------------------------+--------+ ICA Prox  57     12             heterogenous, irregular and                                               calcific                                  +----------+-------+-------+--------+---------------------------------+--------+ ICA Distal69     15                                                       +----------+-------+-------+--------+---------------------------------+--------+ ECA       105    7                                                        +----------+-------+-------+--------+---------------------------------+--------+ Portions of this table do not appear on this page. +----------+--------+-------+----------------+------------+           PSV cm/sEDV cmsDescribe        Arm Pressure +----------+--------+-------+----------------+------------+ Subclavian93             Multiphasic, WNL             +----------+--------+-------+----------------+------------+ +---------+--------+--+--------+--+---------+  VertebralPSV cm/s58EDV cm/s13Antegrade +---------+--------+--+--------+--+---------+ Left Carotid Findings: +----------+--------+-------+--------+--------------------------------+--------+           PSV cm/sEDV    StenosisDescribe                        Comments                   cm/s                                                    +----------+--------+-------+--------+--------------------------------+--------+ CCA Prox  81      18                                                       +----------+--------+-------+--------+--------------------------------+--------+ CCA Distal61      17             smooth and heterogenous                  +----------+--------+-------+--------+--------------------------------+--------+ ICA Prox  43      7              smooth, heterogenous and                                                  calcific                                 +----------+--------+-------+--------+--------------------------------+--------+ ICA Distal71      23                                                      +----------+--------+-------+--------+--------------------------------+--------+ ECA       61      10             heterogenous and smooth                  +----------+--------+-------+--------+--------------------------------+--------+ +----------+--------+--------+----------------+------------+ SubclavianPSV cm/sEDV cm/sDescribe        Arm Pressure +----------+--------+--------+----------------+------------+           110             Multiphasic, WNL93           +----------+--------+--------+----------------+------------+ +---------+--------+--+--------+--+---------+ VertebralPSV cm/s57EDV cm/s24Antegrade +---------+--------+--+--------+--+---------+  ABI Findings: +--------+------------------+-----+---------+-------------------------+ Right   Rt Pressure (mmHg)IndexWaveform Comment                   +--------+------------------+-----+---------+-------------------------+ Brachial                       triphasicunable to obtain pressure +--------+------------------+-----+---------+-------------------------+ PTA     255               2.74 triphasic                          +--------+------------------+-----+---------+-------------------------+  DP      255               2.74 triphasic                          +--------+------------------+-----+---------+-------------------------+  +--------+------------------+-----+---------+-------+ Left    Lt Pressure (mmHg)IndexWaveform Comment +--------+------------------+-----+---------+-------+ [id number]                     triphasic        +--------+------------------+-----+---------+-------+ PTA     174               1.87 triphasic        +--------+------------------+-----+---------+-------+ DP      255               2.74 triphasic        +--------+------------------+-----+---------+-------+ +-------+---------------+----------------+ ABI/TBIToday's ABI/TBIPrevious ABI/TBI +-------+---------------+----------------+ Right  2.74                            +-------+---------------+----------------+ Left   1.87                            +-------+---------------+----------------+  Right Doppler Findings: +--------+--------+-----+---------+-------------------------+ Site    PressureIndexDoppler  Comments                  +--------+--------+-----+---------+-------------------------+ Brachial             triphasicunable to obtain pressure +--------+--------+-----+---------+-------------------------+ Radial               triphasic                          +--------+--------+-----+---------+-------------------------+ Ulnar                triphasic                          +--------+--------+-----+---------+-------------------------+  Left Doppler Findings: +--------+--------+-----+---------+--------+ Site    PressureIndexDoppler  Comments +--------+--------+-----+---------+--------+ [id number]           triphasic         +--------+--------+-----+---------+--------+ Radial               triphasic         +--------+--------+-----+---------+--------+ Ulnar                triphasic         +--------+--------+-----+---------+--------+  Summary: Right Carotid: Velocities in the right ICA are consistent with a 1-39% stenosis. Left Carotid: Velocities in the left ICA are consistent  with a 1-39% stenosis. Vertebrals:  Bilateral vertebral arteries demonstrate antegrade flow. Subclavians: Normal flow hemodynamics were seen in bilateral subclavian              arteries. Right ABI: Resting right ankle-brachial index indicates noncompressible right lower extremity arteries. Left ABI: Resting left ankle-brachial index indicates noncompressible left lower extremity arteries. Right Upper Extremity: Doppler waveforms remain within normal limits with right radial compression. Doppler waveforms remain within normal limits with right ulnar compression. Left Upper Extremity: Doppler waveforms remain within normal limits with left radial compression. Doppler waveforms remain within normal limits with left ulnar compression.  Electronically signed by Curt Jews MD on 04/16/2019 at 58:46:53 PM.    Final      Medications:     Scheduled Medications: . aspirin EC  81  mg Oral Daily  . Chlorhexidine Gluconate Cloth  6 each Topical Daily  . digoxin  0.125 mg Oral Daily  . feeding supplement (ENSURE ENLIVE)  237 mL Oral TID BM  . insulin aspart  0-15 Units Subcutaneous TID WC  . insulin aspart  8 Units Subcutaneous TID WC  . insulin glargine  28 Units Subcutaneous QHS  . multivitamin with minerals  1 tablet Oral Daily  . pneumococcal 23 valent vaccine  0.5 mL Intramuscular Tomorrow-1000  . potassium chloride  40 mEq Oral BID  . rosuvastatin  40 mg Oral q1800  . sodium chloride flush  10-40 mL Intracatheter Q12H  . sodium chloride flush  3 mL Intravenous Q12H  . spironolactone  12.5 mg Oral Daily    Infusions: . sodium chloride    . amiodarone 60 mg/hr (05/15/2019 0700)  . heparin 1,550 Units/hr (04/27/2019 0700)  . lidocaine 3 mg/min (04/30/2019 0700)  . milrinone Stopped (04/16/19 2116)  . norepinephrine (LEVOPHED) Adult infusion Stopped (04/16/19 0439)    PRN Medications: sodium chloride, ALPRAZolam, nitroGLYCERIN, ondansetron (ZOFRAN) IV, sodium chloride flush, sodium chloride flush,  temazepam, traMADol     Assessment/Plan    1. Cardiogenic Shock  - Hypotensive. CO-OX low off milrinone.  - Place IABP/swan now to optimize prior to CABG - May need to add norepi.     2.Acute Systolic HF, ICM  -ECHO completed today with EF 30-35%, RV normal, and mod-severe MR.  - Elevated filling pressures on cath.  - CVP up to 14.  - Off milrinone with frequent NSVT.  - Hold off on bb.  -Renal function stable.   2. 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  - On statin - CT surgery consulted.  -Planning for CABG on Friday.   3. Mitral Regurgitation - Mod-severe on ECHO -Hopefully will improve with diuresis.   4. Uncontrolled DM -Hgb A1C 9.  - On sliding scale.   5. Anemia  -Hgb 9.8   -Iron sats 15%.   6. NSVT -Continue amio drip. + lidocaine drip 3 mg per hour.  - Ectopy decreased.  -Keep K >4  -Keep  Mag >2   7. Neuropathy -Very limited with severe neuropathy. No sensation R and L foot and occasionaly in his hands. Requires assistance with ADLs.    8. Severe Malnutrition -Prealbumin 8.9  -Consult dietitian.   Discussed IABP + swan this am.    Length of Stay: 3  Amy Clegg, NP  04/29/2019, 7:28 AM  Advanced Heart Failure Team Pager [phone] (M-F; 7a - 4p)  Please contact [facility] for night-coverage after hours (4p -7a ) and weekends on [email or web address]  Agree with above.   Co-ox improved with milrinone but had frequent polymorphic NSVT. Minimal response to amio and lidocaine. Milrinone eventually stopped. This am co-ox back down to 47% Feels weak. Intermittent SOB. No CP.   General:  Weak appearing. No resp difficulty HEENT: normal Neck: supple. JVP to jaw . Carotids 2+ bilat; no bruits. No lymphadenopathy or thryomegaly appreciated. Cor: PMI nondisplaced. Regular rate & rhythm. +s3 Lungs: clear Abdomen: soft, nontender, nondistended. No hepatosplenomegaly. No bruits or masses. Good bowel sounds. Extremities: no cyanosis,  clubbing, rash, edema cool  Neuro: alert & orientedx3, cranial nerves grossly intact. moves all 4 extremities w/o difficulty. Affect pleasant  He is back in shock. PMVT with milrinone suggests very low ischemic threshold. Will take to cath lab for pre-CABG IABP and swan. D/w Dr. Prescott Gum.   CRITICAL  CARE Performed by: Glori Bickers  Total critical care time: 35 minutes  Critical care time was exclusive of separately billable procedures and treating other patients.  Critical care was necessary to treat or prevent imminent or life-threatening deterioration.  Critical care was time spent personally by me (independent of midlevel providers or residents) on the following activities: development of treatment plan with patient and/or surrogate as well as nursing, discussions with consultants, evaluation of patient's response to treatment, examination of patient, obtaining history from patient or surrogate, ordering and performing treatments and interventions, ordering and review of laboratory studies, ordering and review of radiographic studies, pulse oximetry and re-evaluation of patient's condition.  Glori Bickers, MD  8:37 AM

## 2019-04-17 NOTE — Progress Notes (Signed)
PT Cancellation Note  Patient Details Name: Brendan [name] MRN: [id number] DOB: 02-Oct-1955   Cancelled Treatment:    Reason Eval/Treat Not Completed: Patient at procedure or test/unavailable. Pt for preop IABP and Swan.   Shary Decamp [facility] 05/05/2019, 8:33 AM [place] Pager [phone] Office [phone]

## 2019-04-17 NOTE — Procedures (Signed)
Intubation Procedure Note Brendan [name] [id number] Jul 25, 1955  Procedure: Intubation Indications: Airway protection and maintenance Pt acutely altered in setting of cardiogenic shock s/p vasopressors and IABP  Procedure Details Consent: Unable to obtain consent because of altered level of consciousness. Time Out: Verified patient identification, verified procedure, site/side was marked, verified correct patient position, special equipment/implants available, medications/allergies/relevent history reviewed, required imaging and test results available.  Performed  Upper and lower dentures removed prior to intubation-> given to bedside RN RSI with Etomidate 20 mg, Fentanyl 139m  And NMB with Rocuronium 100 mg once vocal cords were visualized  Maximum sterile technique was used including cap, gloves, hand hygiene and mask.  MAC and 4 ETT size 8 24 at lip Color change on end tidal appreciated and b/l breath sounds appreciated   Evaluation Hemodynamic Status: BP stable throughout; O2 sats: stable throughout Patient's Current Condition: stable Complications: No apparent complications Patient did tolerate procedure well. Chest X-ray ordered to verify placement.  CXR: pending.   KRise PaganiniScatliffe 05/11/2019

## 2019-04-17 NOTE — Progress Notes (Signed)
Orthopedic Tech Progress Note Patient Details:  Brendan [name] August 19, 1955 [id number] Was called by secretary to bring patient a KNEE IMMOBOLIZIER  Ortho Devices Type of Ortho Device: Knee Immobilizer Ortho Device/Splint Location: RLE Ortho Device/Splint Interventions: Application, Ordered, Adjustment   Post Interventions Patient Tolerated: Well Instructions Provided: Care of device, Adjustment of device   Janit Pagan 04/28/2019, 3:00 PM

## 2019-04-17 NOTE — Progress Notes (Signed)
[facility] for Heparin Indication: chest pain/ACS  Heparin Dosing Weight: 68.2 kg  Labs: Recent Labs    04/13/2019 2149 03/19/2019 2212 03/18/2019 0113 04/09/2019 0427 04/16/19 0448 04/16/19 0448 04/16/19 1218 04/16/19 1225 04/16/19 1813 05/07/2019 0311 04/20/2019 0853 04/27/2019 1725  HGB  --   --  9.8*   < > 9.8*   < >  --   --   --  9.9* 10.2*  9.2*  --   HCT  --   --  29.7*   < > 30.2*  --   --   --   --  30.1* 30.0*  27.0*  --   PLT  --   --  261  --  319  --   --   --   --  282  --   --   APTT 65*  --   --   --   --   --   --   --   --   --   --   --   LABPROT 15.6*  --   --   --   --   --   --   --   --   --   --   --   INR 1.3*  --   --   --   --   --   --   --   --   --   --   --   HEPARINUNFRC  --    < >  --    < > 0.24*  --    < >  --  0.49 0.61  --  0.52  CREATININE 1.28*   < > 1.26*   < > 0.99  --   --  1.17  --  1.13  --   --    < > = values in this interval not displayed.    Assessment: 64 yo male presenting to [facility] with malaise and SOB for 4 days found to have elevated troponin and new depressed EF. Transferred to [facility] for further evaluation. Patient was started on heparin IV on 3/26, no anticoagulation PTA.   Patient now s/p cath 3/30 showing severe 3-vessel obstructive CAD, high LV filling pressures, reduced cardiac output, and moderate pulmonary HTN. After cath patient was transfered to the ICU and place PICC for IV milrinone, considering CABG. Pharmacy consulted to resume heparin 8 hours post-sheath removal (removed at 1510 per cath procedure log).  Heparin level 0.52 is at higher end of therapeutic range on heparin 1450 units/hr.   Goal of Therapy:  Heparin level 0.3-0.5 units/ml Monitor platelets by anticoagulation protocol: Yes   Plan:  Continue heparin to 1450 units/hr  Monitor daily HL, CBC/plt Monitor for signs/symptoms of bleeding  Follow up plans for CABG  Benetta Spar, PharmD, BCPS, [facility] Clinical  Pharmacist  Please check AMION for all [place] phone numbers After 10:00 PM, call [place] [phone]

## 2019-04-17 NOTE — Progress Notes (Signed)
OT Cancellation Note  Patient Details Name: ARK AGRUSA MRN: [id number] DOB: Jun 24, 1955   Cancelled Treatment:    Reason Eval/Treat Not Completed: Patient not medically ready Pt for preop IABP and Swan. Will continue to follow to initiate OT POC.  Zenovia Jarred, MSOT, OTR/L Acute Rehabilitation Services [facility] Office Number: [phone] Pager: [phone]  Zenovia Jarred 05/13/2019, 3:30 PM

## 2019-04-17 NOTE — Progress Notes (Signed)
ANTICOAGULATION CONSULT NOTE  Pharmacy Consult for Heparin Indication: chest pain/ACS  Heparin Dosing Weight: 68.2 kg  Labs: Recent Labs    04/12/2019 2149 04/02/2019 2212 03/27/2019 0113 03/22/2019 0427 03/20/2019 2213 04/16/19 0448 04/16/19 0448 04/16/19 1218 04/16/19 1225 04/16/19 1813 04/26/2019 0311  HGB  --   --  9.8*   < > 9.9* 9.8*  --   --   --   --  9.9*  HCT  --   --  29.7*   < > 29.0* 30.2*  --   --   --   --  30.1*  PLT  --   --  261  --   --  319  --   --   --   --  282  APTT 65*  --   --   --   --   --   --   --   --   --   --   LABPROT 15.6*  --   --   --   --   --   --   --   --   --   --   INR 1.3*  --   --   --   --   --   --   --   --   --   --   HEPARINUNFRC  --    < >  --    < >  --  0.24*   < > 0.61  --  0.49 0.61  CREATININE 1.28*   < > 1.26*   < > 0.90 0.99  --   --  1.17  --  1.13   < > = values in this interval not displayed.    Assessment: 64 yo male presenting to [facility] with malaise and SOB for 4 days found to have elevated troponin and new depressed EF. Transferred to [facility] for further evaluation. Patient was started on heparin IV on 3/26, no anticoagulation PTA.   Patient now s/p cath 3/30 showing severe 3-vessel obstructive CAD, high LV filling pressures, reduced cardiac output, and moderate pulmonary HTN. After cath patient was transfered to the ICU and place PICC for IV milrinone, considering CABG. Pharmacy consulted to resume heparin 8 hours post-sheath removal (removed at 1510 per cath procedure log).  Heparin level is therapeutic at 0.61, on heparin 1550 units/hr. Underwent IABP placement this morning. No s/sx of bleeding or infusion issues.  Goal of Therapy:  Heparin level 0.3-0.5 units/ml Monitor platelets by anticoagulation protocol: Yes   Plan:  Reduce heparin to 1450 units/hr Order heparin level in 6 hours   Monitor daily HL, CBC/plt Monitor for signs/symptoms of bleeding  Follow up plans for CABG  Antonietta Jewel, PharmD,  [facility] Clinical Pharmacist  Phone: [phone]  Please check AMION for all [place] phone numbers After 10:00 PM, call [place] [phone]

## 2019-04-17 NOTE — Progress Notes (Signed)
   Patient has become progressively ill throughout the afternoon and evening despite IABP and pressure support. We have made numerous adjustments to drips.   Last co-ox 45% on NE and milrinone and IABP 1:1.  Now more confused and agitated. On 100% veni-mask with RR 30s.   Chest CT with large bilateral pleural effusions.  I have adjusted his inotropes. And increased lasix gtt.   CCM consulted for endotracheal intubation.   Will likely need to defer CABG and proceed with Impella 5.5 and bilateral CTs in am. Will d/w Dr. Prescott Gum.   Additional CCT 70 minutes. Not including arterial line placement.   Glori Bickers, MD  9:32 PM

## 2019-04-17 NOTE — Progress Notes (Signed)
Dr Haroldine Laws paged regarding patient's current status. Patient received 160mg  of Lasix today with total output of 450 for this shift so far. CVP is currently 20 and patient has audible expiratory wheezes. Patient is also very fidgety and cannot sit still despite constant reminders from RN and education on IABP bedrest. Verbal orders received for a Precedex gtt, 5mg  of metolazone, and to restart Milrinone at .125. RN will continue to monitor.

## 2019-04-17 NOTE — Telephone Encounter (Signed)
Returned patient's wife's call and answered her questions.  She says he is worried about getting disability in order to afford his medications, and I said that we would work together with his cardiologist and with our clinical social worker to ensure that he gets the medications he needs.  For now, though, we will focus on his upcoming surgery and recovery.  I will continue to follow along with his hospital course.

## 2019-04-17 NOTE — Progress Notes (Signed)
EVENING ROUNDS NOTE :     [address]       [place] [address]             [phone]                 Day of Surgery Procedure(s) (LRB): RIGHT HEART CATH (N/A) IABP INSERTION (N/A)   Total Length of Stay:  LOS: 3 days  Events:   IABP placed today.  Good augmentation Agitated today.  On precedex, and receiving prn ativan for potential DTs Good hemodynamics OR tomorrow    BP 112/78 (BP Location: Left Arm)   Pulse 77   Temp (!) 97.2 F (36.2 C) (Core)   Resp (!) 23   Ht 5\' 11"  (1.803 m)   Wt 68.4 kg   SpO2 94%   BMI 21.03 kg/m   PAP: (53-72)/(21-41) 59/26 CVP:  [10 mmHg-23 mmHg] 15 mmHg PCWP:  [22 mmHg-29 mmHg] 22 mmHg CO:  [3.6 L/min-5.9 L/min] 5.9 L/min CI:  [1.9 L/min/m2-3.2 L/min/m2] 3.2 L/min/m2     . sodium chloride    . sodium chloride    . sodium chloride    . amiodarone 60 mg/hr (04/24/2019 1600)  . [START ON 04/27/2019] cefUROXime (ZINACEF)  IV    . [START ON 05/13/2019] cefUROXime (ZINACEF)  IV    . [START ON 05/13/2019] dexmedetomidine    . dexmedetomidine (PRECEDEX) IV infusion 0.4 mcg/kg/hr (05/07/2019 1600)  . [START ON 05/02/2019] heparin 30,000 units/NS 1000 mL solution for CELLSAVER    . heparin 1,450 Units/hr (04/30/2019 1600)  . lidocaine 1 mg/min (05/11/2019 1600)  . [START ON 05/08/2019] milrinone    . milrinone 0.125 mcg/kg/min (05/13/2019 1601)  . [START ON 04/23/2019] nitroGLYCERIN    . norepinephrine (LEVOPHED) Adult infusion Stopped (04/16/19 0439)  . [START ON 05/07/2019] norepinephrine    . [START ON 05/10/2019] tranexamic acid (CYKLOKAPRON) infusion ([facility])    . [START ON 04/23/2019] vancomycin      I/O last 3 completed shifts: In: 2368.1 [I.V.:2368.1] Out: 4700 [Urine:4700]   CBC Latest Ref Rng & Units 05/12/2019 05/16/2019 04/23/2019  WBC 4.0 - 10.5 K/uL - - 9.4  Hemoglobin 13.0 - 17.0 g/dL 10.2(L) 9.2(L) 9.9(L)  Hematocrit 39.0 - 52.0 % 30.0(L) 27.0(L) 30.1(L)  Platelets 150 - 400 K/uL - - 282    BMP Latest Ref Rng & Units  04/26/2019 05/14/2019 05/08/2019  Glucose 70 - 99 mg/dL - - 412(H)  BUN 8 - 23 mg/dL - - 38(H)  Creatinine 0.61 - 1.24 mg/dL - - 1.13  Sodium 135 - 145 mmol/L 132(L) 136 131(L)  Potassium 3.5 - 5.1 mmol/L 5.3(H) 4.8 4.8  Chloride 98 - 111 mmol/L - - 94(L)  CO2 22 - 32 mmol/L - - 23  Calcium 8.9 - 10.3 mg/dL - - 8.5(L)    ABG    Component Value Date/Time   PHART 7.392 04/11/2019 1501   PCO2ART 33.0 03/24/2019 1501   PO2ART 79.0 (L) 04/02/2019 1501   HCO3 21.4 04/29/2019 0853   HCO3 23.2 05/07/2019 0853   TCO2 22 04/21/2019 0853   TCO2 24 05/09/2019 0853   ACIDBASEDEF 3.0 (H) 04/23/2019 0853   ACIDBASEDEF 2.0 04/20/2019 0853   O2SAT 43.2 04/23/2019 1622       Melodie Bouillon, MD 05/07/2019 4:45 PM

## 2019-04-17 NOTE — Progress Notes (Signed)
Stat bedside chest xray done.

## 2019-04-17 NOTE — Interval H&P Note (Signed)
History and Physical Interval Note:  04/29/2019 8:38 AM  Brendan [name]  has presented today for surgery, with the diagnosis of chf.  The various methods of treatment have been discussed with the patient and family. After consideration of risks, benefits and other options for treatment, the patient has consented to  Procedure(s): RIGHT HEART CATH (N/A) IABP INSERTION (N/A) as a surgical intervention.  The patient's history has been reviewed, patient examined, no change in status, stable for surgery.  I have reviewed the patient's chart and labs.  Questions were answered to the patient's satisfaction.     Taisha Pennebaker

## 2019-04-17 NOTE — H&P (View-Only) (Signed)
Advanced Heart Failure Rounding Note  PCP-Cardiologist: No primary care provider on file.   Subjective:   Cath 3/30 with severe 3 vessel disease.   Overnight off milrinone due to ongoing NSVT. Started on amio drip + lidocaine drip. Lidocaine increased to 3 mg per hour.   Denies SOB. Denies chest pain.   Objective:   Weight Range: 68.4 kg Body mass index is 21.03 kg/m.   Vital Signs:   Temp:  [98.2 F (36.8 C)-98.5 F (36.9 C)] 98.2 F (36.8 C) (04/01 0308) Pulse Rate:  [45-112] 48 (04/01 0630) Resp:  [17-24] 22 (04/01 0702) BP: (78-110)/(55-86) 90/65 (04/01 0702) SpO2:  [76 %-97 %] 88 % (04/01 0630) Weight:  [68.4 kg] 68.4 kg (04/01 0452) Last BM Date: 04/08/2019  Weight change: Filed Weights   03/22/2019 0519 04/16/19 0600 05/10/2019 0452  Weight: 67.5 kg 65.6 kg 68.4 kg    Intake/Output:   Intake/Output Summary (Last 24 hours) at 05/01/2019 0728 Last data filed at 05/14/2019 0700 Gross per 24 hour  Intake 1872.2 ml  Output 1900 ml  Net -27.8 ml      Physical Exam  CVP 14.  General:  Thin. No resp difficulty HEENT: normal Neck: supple. JVP to jaw . Carotids 2+ bilat; no bruits. No lymphadenopathy or thryomegaly appreciated. Cor: PMI nondisplaced. Regular rate & rhythm. No rubs, gallops or murmurs. Lungs: clear Abdomen: soft, nontender, nondistended. No hepatosplenomegaly. No bruits or masses. Good bowel sounds. Extremities: no cyanosis, clubbing, rash, edema Neuro: alert & orientedx3, cranial nerves grossly intact. moves all 4 extremities w/o difficulty. Affect pleasant  Telemetry   SR with frequent NSVT/PVCs   Labs    CBC Recent Labs    04/16/19 0448 05/05/2019 0311  WBC 9.0 9.4  HGB 9.8* 9.9*  HCT 30.2* 30.1*  MCV 87.5 87.0  PLT 319 967   Basic Metabolic Panel Recent Labs    04/16/19 0448 04/16/19 0448 04/16/19 1225 05/09/2019 0311  NA 134*   < > 134* 131*  K 3.6   < > 4.1 4.8  CL 101   < > 97* 94*  CO2 23   < > 26 23  GLUCOSE 215*   < >  375* 412*  BUN 34*   < > 36* 38*  CREATININE 0.99   < > 1.17 1.13  CALCIUM 8.2*   < > 8.7* 8.5*  MG 2.6*  --   --  2.2   < > = values in this interval not displayed.   Liver Function Tests Recent Labs    03/22/2019 2149 04/16/19 0448  AST 24 17  ALT 22 18  ALKPHOS 89 83  BILITOT 0.8 0.6  PROT 5.8* 5.3*  ALBUMIN 2.7* 2.5*   No results for input(s): LIPASE, AMYLASE in the last 72 hours. Cardiac Enzymes No results for input(s): CKTOTAL, CKMB, CKMBINDEX, TROPONINI in the last 72 hours.  BNP: BNP (last 3 results) Recent Labs    03/24/2019 0113  BNP 655.7*    ProBNP (last 3 results) No results for input(s): PROBNP in the last 8760 hours.   D-Dimer No results for input(s): DDIMER in the last 72 hours. Hemoglobin A1C Recent Labs    03/18/2019 2149  HGBA1C 9.0*   Fasting Lipid Panel No results for input(s): CHOL, HDL, LDLCALC, TRIG, CHOLHDL, LDLDIRECT in the last 72 hours. Thyroid Function Tests Recent Labs    03/31/2019 0113  TSH 0.925    Other results:   Imaging    VAS US DOPPLER  PRE CABG  Result Date: 04/16/2019 PREOPERATIVE VASCULAR EVALUATION  Indications:      Pre-CABG. Risk Factors:     Hypertension, Diabetes. Other Factors:    Tobacco use: snuff and chew. Comparison Study: No prior study Performing Technologist: Maudry Mayhew MHA, RDMS, RVT, RDCS  Examination Guidelines: A complete evaluation includes B-mode imaging, spectral Doppler, color Doppler, and power Doppler as needed of all accessible portions of each vessel. Bilateral testing is considered an integral part of a complete examination. Limited examinations for reoccurring indications may be performed as noted.  Right Carotid Findings: +----------+-------+-------+--------+---------------------------------+--------+           PSV    EDV    StenosisDescribe                         Comments           cm/s   cm/s                                                      +----------+-------+-------+--------+---------------------------------+--------+ CCA Prox  69     12                                                       +----------+-------+-------+--------+---------------------------------+--------+ CCA Distal86     16             heterogenous and irregular                +----------+-------+-------+--------+---------------------------------+--------+ ICA Prox  57     12             heterogenous, irregular and                                               calcific                                  +----------+-------+-------+--------+---------------------------------+--------+ ICA Distal69     15                                                       +----------+-------+-------+--------+---------------------------------+--------+ ECA       105    7                                                        +----------+-------+-------+--------+---------------------------------+--------+ Portions of this table do not appear on this page. +----------+--------+-------+----------------+------------+           PSV cm/sEDV cmsDescribe        Arm Pressure +----------+--------+-------+----------------+------------+ Subclavian93             Multiphasic, WNL             +----------+--------+-------+----------------+------------+ +---------+--------+--+--------+--+---------+  VertebralPSV cm/s58EDV cm/s13Antegrade +---------+--------+--+--------+--+---------+ Left Carotid Findings: +----------+--------+-------+--------+--------------------------------+--------+           PSV cm/sEDV    StenosisDescribe                        Comments                   cm/s                                                    +----------+--------+-------+--------+--------------------------------+--------+ CCA Prox  81      18                                                       +----------+--------+-------+--------+--------------------------------+--------+ CCA Distal61      17             smooth and heterogenous                  +----------+--------+-------+--------+--------------------------------+--------+ ICA Prox  43      7              smooth, heterogenous and                                                  calcific                                 +----------+--------+-------+--------+--------------------------------+--------+ ICA Distal71      23                                                      +----------+--------+-------+--------+--------------------------------+--------+ ECA       61      10             heterogenous and smooth                  +----------+--------+-------+--------+--------------------------------+--------+ +----------+--------+--------+----------------+------------+ SubclavianPSV cm/sEDV cm/sDescribe        Arm Pressure +----------+--------+--------+----------------+------------+           110             Multiphasic, WNL93           +----------+--------+--------+----------------+------------+ +---------+--------+--+--------+--+---------+ VertebralPSV cm/s57EDV cm/s24Antegrade +---------+--------+--+--------+--+---------+  ABI Findings: +--------+------------------+-----+---------+-------------------------+ Right   Rt Pressure (mmHg)IndexWaveform Comment                   +--------+------------------+-----+---------+-------------------------+ Brachial                       triphasicunable to obtain pressure +--------+------------------+-----+---------+-------------------------+ PTA     255               2.74 triphasic                          +--------+------------------+-----+---------+-------------------------+  DP      255               2.74 triphasic                          +--------+------------------+-----+---------+-------------------------+  +--------+------------------+-----+---------+-------+ Left    Lt Pressure (mmHg)IndexWaveform Comment +--------+------------------+-----+---------+-------+ [id number]                     triphasic        +--------+------------------+-----+---------+-------+ PTA     174               1.87 triphasic        +--------+------------------+-----+---------+-------+ DP      255               2.74 triphasic        +--------+------------------+-----+---------+-------+ +-------+---------------+----------------+ ABI/TBIToday's ABI/TBIPrevious ABI/TBI +-------+---------------+----------------+ Right  2.74                            +-------+---------------+----------------+ Left   1.87                            +-------+---------------+----------------+  Right Doppler Findings: +--------+--------+-----+---------+-------------------------+ Site    PressureIndexDoppler  Comments                  +--------+--------+-----+---------+-------------------------+ Brachial             triphasicunable to obtain pressure +--------+--------+-----+---------+-------------------------+ Radial               triphasic                          +--------+--------+-----+---------+-------------------------+ Ulnar                triphasic                          +--------+--------+-----+---------+-------------------------+  Left Doppler Findings: +--------+--------+-----+---------+--------+ Site    PressureIndexDoppler  Comments +--------+--------+-----+---------+--------+ [id number]           triphasic         +--------+--------+-----+---------+--------+ Radial               triphasic         +--------+--------+-----+---------+--------+ Ulnar                triphasic         +--------+--------+-----+---------+--------+  Summary: Right Carotid: Velocities in the right ICA are consistent with a 1-39% stenosis. Left Carotid: Velocities in the left ICA are consistent  with a 1-39% stenosis. Vertebrals:  Bilateral vertebral arteries demonstrate antegrade flow. Subclavians: Normal flow hemodynamics were seen in bilateral subclavian              arteries. Right ABI: Resting right ankle-brachial index indicates noncompressible right lower extremity arteries. Left ABI: Resting left ankle-brachial index indicates noncompressible left lower extremity arteries. Right Upper Extremity: Doppler waveforms remain within normal limits with right radial compression. Doppler waveforms remain within normal limits with right ulnar compression. Left Upper Extremity: Doppler waveforms remain within normal limits with left radial compression. Doppler waveforms remain within normal limits with left ulnar compression.  Electronically signed by Curt Jews MD on 04/16/2019 at 67:46:53 PM.    Final      Medications:     Scheduled Medications: . aspirin EC  81  mg Oral Daily  . Chlorhexidine Gluconate Cloth  6 each Topical Daily  . digoxin  0.125 mg Oral Daily  . feeding supplement (ENSURE ENLIVE)  237 mL Oral TID BM  . insulin aspart  0-15 Units Subcutaneous TID WC  . insulin aspart  8 Units Subcutaneous TID WC  . insulin glargine  28 Units Subcutaneous QHS  . multivitamin with minerals  1 tablet Oral Daily  . pneumococcal 23 valent vaccine  0.5 mL Intramuscular Tomorrow-1000  . potassium chloride  40 mEq Oral BID  . rosuvastatin  40 mg Oral q1800  . sodium chloride flush  10-40 mL Intracatheter Q12H  . sodium chloride flush  3 mL Intravenous Q12H  . spironolactone  12.5 mg Oral Daily    Infusions: . sodium chloride    . amiodarone 60 mg/hr (05/11/2019 0700)  . heparin 1,550 Units/hr (05/12/2019 0700)  . lidocaine 3 mg/min (05/05/2019 0700)  . milrinone Stopped (04/16/19 2116)  . norepinephrine (LEVOPHED) Adult infusion Stopped (04/16/19 0439)    PRN Medications: sodium chloride, ALPRAZolam, nitroGLYCERIN, ondansetron (ZOFRAN) IV, sodium chloride flush, sodium chloride flush,  temazepam, traMADol     Assessment/Plan    1. Cardiogenic Shock  - Hypotensive. CO-OX low off milrinone.  - Place IABP/swan now to optimize prior to CABG - May need to add norepi.     2.Acute Systolic HF, ICM  -ECHO completed today with EF 30-35%, RV normal, and mod-severe MR.  - Elevated filling pressures on cath.  - CVP up to 14.  - Off milrinone with frequent NSVT.  - Hold off on bb.  -Renal function stable.   2. 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  - On statin - CT surgery consulted.  -Planning for CABG on Friday.   3. Mitral Regurgitation - Mod-severe on ECHO -Hopefully will improve with diuresis.   4. Uncontrolled DM -Hgb A1C 9.  - On sliding scale.   5. Anemia  -Hgb 9.8   -Iron sats 15%.   6. NSVT -Continue amio drip. + lidocaine drip 3 mg per hour.  - Ectopy decreased.  -Keep K >4  -Keep  Mag >2   7. Neuropathy -Very limited with severe neuropathy. No sensation R and L foot and occasionaly in his hands. Requires assistance with ADLs.    8. Severe Malnutrition -Prealbumin 8.9  -Consult dietitian.   Discussed IABP + swan this am.    Length of Stay: 3  Amy Clegg, NP  05/09/2019, 7:28 AM  Advanced Heart Failure Team Pager [phone] (M-F; 7a - 4p)  Please contact [facility] for night-coverage after hours (4p -7a ) and weekends on [email or web address]  Agree with above.   Co-ox improved with milrinone but had frequent polymorphic NSVT. Minimal response to amio and lidocaine. Milrinone eventually stopped. This am co-ox back down to 47% Feels weak. Intermittent SOB. No CP.   General:  Weak appearing. No resp difficulty HEENT: normal Neck: supple. JVP to jaw . Carotids 2+ bilat; no bruits. No lymphadenopathy or thryomegaly appreciated. Cor: PMI nondisplaced. Regular rate & rhythm. +s3 Lungs: clear Abdomen: soft, nontender, nondistended. No hepatosplenomegaly. No bruits or masses. Good bowel sounds. Extremities: no cyanosis,  clubbing, rash, edema cool  Neuro: alert & orientedx3, cranial nerves grossly intact. moves all 4 extremities w/o difficulty. Affect pleasant  He is back in shock. PMVT with milrinone suggests very low ischemic threshold. Will take to cath lab for pre-CABG IABP and swan. D/w Dr. Prescott Gum.   CRITICAL  CARE Performed by: Glori Bickers  Total critical care time: 35 minutes  Critical care time was exclusive of separately billable procedures and treating other patients.  Critical care was necessary to treat or prevent imminent or life-threatening deterioration.  Critical care was time spent personally by me (independent of midlevel providers or residents) on the following activities: development of treatment plan with patient and/or surrogate as well as nursing, discussions with consultants, evaluation of patient's response to treatment, examination of patient, obtaining history from patient or surrogate, ordering and performing treatments and interventions, ordering and review of laboratory studies, ordering and review of radiographic studies, pulse oximetry and re-evaluation of patient's condition.  Glori Bickers, MD  8:37 AM

## 2019-04-17 NOTE — CV Procedure (Signed)
Radial arterial line placement.    Emergent consent assumed. The left wrist was prepped and draped in the routine sterile fashion a single lumen radial arterial catheter was placed in the left radial artery using a modified Seldinger technique and u/s guidance. Good blood flow and wave forms. A dressing was placed.     Glori Bickers, MD  9:34 PM

## 2019-04-17 NOTE — Progress Notes (Signed)
2100-2120  Dr Haroldine Laws at bedside assessing pt  LA = 2.2,  Lasix gtt has been started with improving output (220 ml this past hour).  Pt remains confused, increase work of breathing, now on NRB with stable sats but wet cough.   Drawing coox, MD placed aline.  2110: Called eLink to request ground team be sent for emergent intubation.   2125 Ground team here. MD updated by Dr Haroldine Laws, prep to intubate.

## 2019-04-17 NOTE — Progress Notes (Signed)
CRITICAL VALUE STICKER  CRITICAL VALUE: LA 2.2   RECEIVER (on-site recipient of call): Leafy Kindle RN  [place] NOTIFIED: 04/17/2019 @ 2010  MESSENGER (representative from lab):  MD NOTIFIED: Dr Haroldine Laws  TIME OF NOTIFICATION: 04/17/2019 @ 2045  RESPONSE: MD in to see pt, will get coox.

## 2019-04-17 NOTE — Consult Note (Addendum)
NAME:  Brendan [name], MRN:  [id number], DOB:  01/24/1955, LOS: 3 ADMISSION DATE:  03/28/2019, CONSULTATION DATE:  05/10/2019 REFERRING MD:  Dr. Haroldine Laws, CHIEF COMPLAINT:  Respiratory distress  Brief History   69 yoM originally presented with SOB and fatigue at [facility] found to have new HFrEF,  NSTEMI, and bilateral pleural effusions transferred to [facility] on 3/29 for further cardiac evaluation.  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  Started on lasix and milrinone gtts however developed NSVT.  Was taken 4/1 for placement of IABP and swan for optimization prior to CABG.  Was on precedex for agitation/ confusion, some concern for DTs.  On the evening of 4/1, patient developed worsening respiratory distress and hypoxia, PCCM consulted for intubation and vent management.   History of present illness   HPI obtained from medical chart review as patient is s/p emergent intubation and unable to provide history.    64 year old male with prior history of tobacco abuse, HTN, poorly controlled diabetes, and diabetic neuropathy who presented to [facility] on 3/26 with complaints of 4 days history of general malaise and shortness of breath.  He was found to have elevated troponin's, inferior wall EKG changes, and new systolic heart failure with EF 25%.  There was some concern for possible pneumonia vs pulmonary edema and was empirically on ceftriaxone and doxycycline, of which were since stopped given negative procalcitonin and no leukocytosis.  He was transferred to [facility] on 3/29 for further cardiac evaluation and consideration of cardiac catheterization.   At [facility], underwent left and right heart cath which showed severe 3 vessel obstructive CAD.  Repeat echo showed EF 30-35%, normal RV, and moderate to severe MR with CVP of 25 and LVEDP of 35 with CI 1.9- 2.0.  Heart failure team consulted and he was started on lasix gtt and milrinone gtts.  His shortness of breath did improve with diuresis.  TCTS were consulted for  CABG.  However, patient developed multiple PVCs and short runs of NSVT.  He was placed on amiodarone and milrinone held however coox worsened.  Milrinone was restarted and required the addition of lidocaine gtt for ongoing NSVT.   On 4/1, he was taken back for RHC and placement of IABP/ PA catheter to help optimize patient prior to CABG.  Throughout the day 4/1, he became increasing agitated despite precedex gtt.  Some concern for possible DTs.  He has intermittently required norepi for hemodynamic support and remained on 1:1 IABP support.  On the evening of 4/1, patient developed worsening hypoxia despite NRB and increased work of breathing this evening.  PCCM consulted for emergent intubation and mechanical ventilation management.   Past Medical History  Tobacco abuse, HTN, poorly controlled diabetes, diabetic neuropathy   Significant Hospital Events   3/26 presented to [facility] 3/29 tx to [facility]  3/30 [facility] 4/1  RHC/ IABP/ PA cath  Consults:  TCTS PCCM  Procedures:  3/30 R/ LHC   4/1 RHC  4/1 IABP R groin >> 4/1 R IJ sheath/ PA cath >> 4/1 Left radial art line >> 4/1 ETT >>  Significant Diagnostic Tests:   3/30 R/ LHC >>  Ost LM to Mid LM lesion is 35% stenosed.  Prox LAD lesion is 90% stenosed.  Prox LAD to Mid LAD lesion is 50% stenosed.  Mid Cx lesion is 100% stenosed.  Prox RCA to Mid RCA lesion is 90% stenosed.  RPDA lesion is 95% stenosed.  LV end diastolic pressure is severely  elevated.  Hemodynamic findings consistent with moderate pulmonary hypertension. 1. Severe 3 vessel obstructive CAD 2. High LV filling pressures 3. Reduced cardiac output with index 2.3 4. Moderate pulmonary HTN.  3/30 TTE >> 1. Left ventricular ejection fraction, by estimation, is 30 to 35%. The left ventricle has moderately decreased function. The left ventricle demonstrates regional wall motion abnormalities (see scoring diagram/findings for description). There is mild left  ventricular hypertrophy. Left ventricular diastolic function could not be evaluated. There is severe hypokinesis of the left ventricular, entire inferior wall, inferoseptal wall, apical segment and lateral wall.  2. Right ventricular systolic function is hyperdynamic. The right ventricular size is normal. There is moderately elevated pulmonary artery systolic pressure.  3. Decreased posterior leaflet motion due to ischemic tethering of the mitral valve leaflets.  4. The mitral valve is abnormal. Moderate to severe mitral valve regurgitation.  5. The aortic valve is tricuspid. Aortic valve regurgitation is not visualized. Mild aortic valve sclerosis is present, with no evidence of aortic valve stenosis.  6. The inferior vena cava is normal in size with greater than 50% respiratory variability, suggesting right atrial pressure of 3 mmHg.   4/1 CT chest w/o >> 1. Large bilateral pleural effusions with associated atelectasis. 2. Moderate to severe bilateral ground-glass opacities, likely reflecting pneumonia and/or edema. Aortic Atherosclerosis   4/1 RHC  >> RA = 18 RV = 60/20  PA = 63/29 (42) PCW = 33 Fick cardiac output/index = 3.7/1.9 PVR = 2.5 WU FA sat = 98% PA sat = 47%, 49% PaPi = 2.2  Micro Data:  3/30 MRSA PCR >> neg 4/1 MRSA PCR >> neg  Antimicrobials:  PTA ceftriaxone/ doxycycline >> 3/29  Interim history/subjective:   Objective   Blood pressure 138/78, pulse 82, temperature 98.4 F (36.9 C), resp. rate (!) 29, height 5\' 11"  (1.803 m), weight 68.4 kg, SpO2 95 %. PAP: (53-87)/(21-49) 87/49 CVP:  [13 mmHg-41 mmHg] 41 mmHg PCWP:  [22 mmHg-29 mmHg] 22 mmHg CO:  [3.6 L/min-5.9 L/min] 5.9 L/min CI:  [1.9 L/min/m2-3.2 L/min/m2] 3.2 L/min/m2  FiO2 (%):  [50 %-100 %] 100 %   Intake/Output Summary (Last 24 hours) at 04/30/2019 2149 Last data filed at 05/03/2019 2100 Gross per 24 hour  Intake 2113.01 ml  Output 1290 ml  Net 823.01 ml   Filed Weights   04/01/2019 0519  04/16/19 0600 05/06/2019 0452  Weight: 67.5 kg 65.6 kg 68.4 kg   Examination: General:  Critically ill thin male sedated / intubated on MV HEENT: MM pink/moist, pupils 3/reactive, ETT Neuro: sedated CV: rr, IABP mechanical sounds, currently at 1:1 PULM:  MV supported breaths, some right sided rhonchi, diminished bibasilar GI: soft, bs+, foley Extremities: warm/dry, no LE edema  Skin: no rashes   Resolved Hospital Problem list    Assessment & Plan:   Acute respiratory insufficiency  Large bilateral pleural effusions -likely transudate 2/2 HFrEF and malnutrition  P:  Full MV support, PCV CXR now ABG in 1 hour VAP bundle/ PPI  PAD protocol with fentanyl gtt and prn versed with bowel regimen Diuresis as below ? If plans for b/l chest tubes in am in OR   Cardiogenic shock  NSTEMI/ CAD with severe 3 vessel disease NSVT Mitral regurgitation- mod to severe  P:  Per HF and TCTS Plans for impella 5.5 placement in OR in am  Continue IABP 1:1 PA hemodynamics per HF Trending coox/ CVP Continue levophed (3 mcg/min), lasix (20 mg/hr), milrinone (0.25 mcg/kg/min) Amio/ lidocaine, and heparin gtts  Plans for eventual CABG     AKI - likely in the setting of cardiogenic shock, diuresis, and possible coronary angiography P:  Monitor closely Continue foley  Strict I/Os, UOP  Daily renal panel/ mag   Acute encephalopathy  - questionable DTs, unclear ETOH use P:  Monitor for s/s withdraws Consider adding empiric thiamine/ folate  PAD protocol as above, d/c precedex, change to fentanyl gtt/ prn versed   Poorly controlled diabetes - HA1c 9 P:  Changed SSI to q 4  Continue lantus   Normocytic Anemia  P:  Trend CBC Transfuse for Hgb <8   Protein calorie malnutrition P:  initiate TF post procedure tomorrow  Best practice:  Diet: NPO Pain/Anxiety/Delirium protocol (if indicated): fentanyl gtt/ versed prn, RASS goal -1 VAP protocol (if indicated): yes DVT prophylaxis:  heparin gtt GI prophylaxis: PPI Glucose control: changed to q 4 SSI with lantus Mobility: BR Code Status: Full  Family Communication: per primary  Disposition: ICU   Labs   CBC: Recent Labs  Lab 03/24/2019 0113 04/08/2019 1501 04/16/19 0448 05/06/2019 0311 05/11/2019 0853 05/13/2019 1830 04/23/2019 2127  WBC 8.1  --  9.0 9.4  --  11.2*  --   HGB 9.8*   < > 9.8* 9.9* 10.2*  9.2* 9.7* 10.2*  HCT 29.7*   < > 30.2* 30.1* 30.0*  27.0* 29.3* 30.0*  MCV 86.8  --  87.5 87.0  --  86.9  --   PLT 261  --  319 282  --  261  --    < > = values in this interval not displayed.    Basic Metabolic Panel: Recent Labs  Lab 03/26/2019 2149 03/27/2019 2149 03/24/2019 0113 03/20/2019 1501 04/05/2019 2127 03/24/2019 2127 04/11/2019 2213 03/22/2019 2213 04/16/19 0448 04/16/19 0448 04/16/19 1225 05/04/2019 0311 05/11/2019 0853 05/14/2019 1830 05/10/2019 2127  NA 133*   < > 134*   < > 137   < > 132*   < > 134*   < > 134* 131* 132*  136 133* 132*  K 4.0   < > 4.4   < > 3.9   < > 4.0   < > 3.6   < > 4.1 4.8 5.3*  4.8 4.9 4.9  CL 102   < > 103  --  106   < > 99  --  101  --  97* 94*  --  98  --   CO2 22   < > 21*  --  23  --   --   --  23  --  26 23  --  24  --   GLUCOSE 219*   < > 103*  --  191*   < > 325*  --  215*  --  375* 412*  --  158*  --   BUN 51*   < > 51*  --  36*   < > 34*  --  34*  --  36* 38*  --  45*  --   CREATININE 1.28*   < > 1.26*  --  0.92   < > 0.90  --  0.99  --  1.17 1.13  --  1.56*  --   CALCIUM 8.0*   < > 8.0*  --  7.9*  --   --   --  8.2*  --  8.7* 8.5*  --  9.0  --   MG 2.5*  --  2.6*  --  2.3  --   --   --  2.6*  --   --  2.2  --   --   --    < > = values in this interval not displayed.   GFR: Estimated Creatinine Clearance: 48.1 mL/min (A) (by C-G formula based on SCr of 1.56 mg/dL (H)). Recent Labs  Lab 03/25/2019 0113 04/16/19 0448 04/26/2019 0311 05/05/2019 1830  WBC 8.1 9.0 9.4 11.2*  LATICACIDVEN  --   --   --  2.2*    Liver Function Tests: Recent Labs  Lab 04/10/2019 2149 04/16/19  0448  AST 24 17  ALT 22 18  ALKPHOS 89 83  BILITOT 0.8 0.6  PROT 5.8* 5.3*  ALBUMIN 2.7* 2.5*   No results for input(s): LIPASE, AMYLASE in the last 168 hours. No results for input(s): AMMONIA in the last 168 hours.  ABG    Component Value Date/Time   PHART 7.462 (H) 05/09/2019 2127   PCO2ART 33.9 05/15/2019 2127   PO2ART 119.0 (H) 05/15/2019 2127   HCO3 24.3 04/24/2019 2127   TCO2 25 04/27/2019 2127   ACIDBASEDEF 3.0 (H) 05/13/2019 0853   ACIDBASEDEF 2.0 05/01/2019 0853   O2SAT 99.0 05/04/2019 2127     Coagulation Profile: Recent Labs  Lab 04/16/2019 2149  INR 1.3*    Cardiac Enzymes: No results for input(s): CKTOTAL, CKMB, CKMBINDEX, TROPONINI in the last 168 hours.  HbA1C: HbA1c, POC (controlled diabetic range)  Date/Time Value Ref Range Status  01/14/2019 02:02 PM 12.5 (A) 0.0 - 7.0 % Final  08/21/2017 02:03 PM 8.5 (A) 0.0 - 7.0 % Final   Hgb A1c MFr Bld  Date/Time Value Ref Range Status  03/17/2019 09:49 PM 9.0 (H) 4.8 - 5.6 % Final    Comment:    (NOTE) Pre diabetes:          5.7%-6.4% Diabetes:              >6.4% Glycemic control for   <7.0% adults with diabetes     CBG: Recent Labs  Lab 04/16/19 1526 04/16/19 2154 04/20/2019 1044 04/23/2019 1141 05/04/2019 1544  GLUCAP 291* 164* 394* 263* 188*    Review of Systems:   unable  Past Medical History  He,  has a past medical history of Cholecystitis (03/2990), Complication of anesthesia, Diabetes mellitus without complication ([place]), Erectile dysfunction (2010), HTN (hypertension), NSTEMI (non-ST elevated myocardial infarction) ([place]) (03/28/2019), and PONV (postoperative nausea and vomiting).   Surgical History    Past Surgical History:  Procedure Laterality Date  . APPENDECTOMY  1969  . CHOLECYSTECTOMY    . CHOLECYSTECTOMY N/A 04/02/2013   Procedure: LAPAROSCOPIC CHOLECYSTECTOMY WITH  INTRAOPERATIVE CHOLANGIOGRAM;  Surgeon: Joyice Faster. Cornett, MD;  Location: [place];  Service: General;  Laterality:  N/A;  . ERCP N/A 04/02/2013   Procedure: ENDOSCOPIC RETROGRADE CHOLANGIOPANCREATOGRAPHY (ERCP);  Surgeon: Inda Castle, MD;  Location: [place];  Service: Endoscopy;  Laterality: N/A;  . 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   . IABP INSERTION N/A 05/08/2019   Procedure: IABP INSERTION;  Surgeon: Jolaine Artist, MD;  Location: [place] [facility];  Service: Cardiovascular;  Laterality: N/A;  . RIGHT HEART CATH N/A 04/27/2019   Procedure: RIGHT HEART CATH;  Surgeon: Jolaine Artist, MD;  Location: [place] [facility];  Service: Cardiovascular;  Laterality: N/A;  . RIGHT/LEFT HEART CATH AND CORONARY ANGIOGRAPHY N/A 04/13/2019   Procedure: RIGHT/LEFT HEART CATH AND CORONARY ANGIOGRAPHY;  Surgeon: Martinique, Peter M, MD;  Location: [place]  [facility];  Service: Cardiovascular;  Laterality: N/A;     Social History   reports that he has never smoked. His smokeless tobacco use includes snuff and chew. He reports previous alcohol use. He reports that he does not use drugs.   Family History   His family history includes Colon cancer in his maternal aunt; Diabetes in his maternal grandmother; Stroke in his maternal grandmother.   Allergies Allergies  Allergen Reactions  . Acetaminophen Itching     Home Medications  Prior to Admission medications   Medication Sig Start Date End Date Taking? Authorizing Provider  Insulin Glargine (BASAGLAR KWIKPEN) 100 UNIT/ML Inject 28 Units into the skin at bedtime.   Yes [provider]  insulin lispro (HUMALOG) 100 UNIT/ML injection Inject 8 Units into the skin 2 (two) times daily before a meal.   Yes [provider]  metFORMIN (GLUCOPHAGE) 500 MG tablet Take 1 tablet (500 mg total) by mouth 2 (two) times daily with a meal. 06/07/17  Yes Hensel, Jamal Collin, MD  glucose blood (RELION GLUCOSE TEST STRIPS) test strip Use as instructed 02/24/19   [name], MD  Insulin Pen Needle (PEN  NEEDLES) 32G X 4 MM MISC     [provider]  ReliOn Ultra Thin Lancets MISC Use to test blood sugar up to 4 times daily. 07/28/13   Coral Spikes, DO     CRITICAL CARE Performed by: Kennieth Rad   Total critical care time: 55 minutes   Critical care time was exclusive of separately billable procedures and treating other patients.   Critical care was necessary to treat or prevent imminent or life-threatening deterioration.   Critical care was time spent personally by me on the following activities: development of treatment plan with patient and/or surrogate as well as nursing, discussions with consultants, evaluation of patient's response to treatment, examination of patient, obtaining history from patient or surrogate, ordering and performing treatments and interventions, ordering and review of laboratory studies, ordering and review of radiographic studies, pulse oximetry and re-evaluation of patient's condition.  Kennieth Rad, MSN, AGACNP-BC [facility] 05/11/2019, 11:06 PM

## 2019-04-17 NOTE — Progress Notes (Signed)
  Agitated.   CVP 18.   SBP 70-90s on milirinone 0.125 mcg + IABP 1:1  Add norepi 5 mcg and give 80 mg IV lasix now.   Tymia Streb NP-C  5:53 PM

## 2019-04-17 NOTE — Progress Notes (Signed)
2 Days Post-Op Procedure(s) (LRB): RIGHT/LEFT HEART CATH AND CORONARY ANGIOGRAPHY (N/A) Subjective: Increasing ventricular ectopy with VT required DC   milrinone with drop in cardiac output-coox Agree with plan for preop IABP to support cardiac output and help with ischemia driven VT  Plan CABG with MVrepair/replacemnet in am Will need mechanical support postop - cont IABP or addition of Impella 5.5,direct Objective: Vital signs in last 24 hours: Temp:  [98.2 F (36.8 C)-98.5 F (36.9 C)] 98.2 F (36.8 C) (04/01 0308) Pulse Rate:  [45-112] 48 (04/01 0630) Cardiac Rhythm: Normal sinus rhythm (04/01 0400) Resp:  [17-24] 22 (04/01 0702) BP: (78-110)/(55-86) 90/65 (04/01 0702) SpO2:  [76 %-97 %] 88 % (04/01 0630) Weight:  [68.4 kg] 68.4 kg (04/01 0452)  Hemodynamic parameters for last 24 hours: CVP:  [8 mmHg-13 mmHg] 13 mmHg  Intake/Output from previous day: 03/31 0701 - 04/01 0700 In: 1872.2 [I.V.:1872.2] Out: 1900 [Urine:1900] Intake/Output this shift: No intake/output data recorded.      Physical Exam  General: comfortable in NSR with little PVCs but low cardiac output HEENT: Normocephalic pupils equal , dentition adequate Neck: Supple without JVD, adenopathy, or bruit Chest: Clear to auscultation, symmetrical breath sounds, no rhonchi, no tenderness             or deformity Cardiovascular: Regular rate and rhythm, no murmur, no gallop, peripheral pulses           not  palpable in all extremities Abdomen:  Soft, nontender, no palpable mass or organomegaly Extremities: Warm, well-perfused, no clubbing cyanosis edema or tenderness,              no venous stasis changes of the legs Rectal/GU: Deferred Neuro: Grossly non--focal and symmetrical throughout Skin: Clean and dry without rash or ulceration   Lab Results: Recent Labs    04/16/19 0448 05/06/2019 0311  WBC 9.0 9.4  HGB 9.8* 9.9*  HCT 30.2* 30.1*  PLT 319 282   BMET:  Recent Labs    04/16/19 1225  04/23/2019 0311  NA 134* 131*  K 4.1 4.8  CL 97* 94*  CO2 26 23  GLUCOSE 375* 412*  BUN 36* 38*  CREATININE 1.17 1.13  CALCIUM 8.7* 8.5*    PT/INR:  Recent Labs    03/29/2019 2149  LABPROT 15.6*  INR 1.3*   ABG    Component Value Date/Time   PHART 7.392 03/25/2019 1501   HCO3 21.4 03/28/2019 1505   TCO2 23 03/30/2019 2213   ACIDBASEDEF 4.0 (H) 03/28/2019 1505   O2SAT 47.2 04/24/2019 0255   CBG (last 3)  Recent Labs    04/16/19 1141 04/16/19 1526 04/16/19 2154  GLUCAP 303* 291* 164*    Assessment/Plan: S/P Procedure(s) (LRB): RIGHT/LEFT HEART CATH AND CORONARY ANGIOGRAPHY (N/A) CABG MVR in am Procedure, benefits and risks d/w patient   LOS: 3 days    [name] 05/06/2019

## 2019-04-17 DEATH — deceased

## 2019-04-18 ENCOUNTER — Inpatient Hospital Stay (HOSPITAL_COMMUNITY): Payer: Medicaid Other

## 2019-04-18 ENCOUNTER — Encounter (HOSPITAL_COMMUNITY)
Admission: AD | Disposition: E | Payer: Self-pay | Source: Other Acute Inpatient Hospital | Attending: Cardiothoracic Surgery

## 2019-04-18 ENCOUNTER — Inpatient Hospital Stay (HOSPITAL_COMMUNITY): Payer: Medicaid Other | Admitting: Certified Registered Nurse Anesthetist

## 2019-04-18 HISTORY — PX: PLACEMENT OF IMPELLA LEFT VENTRICULAR ASSIST DEVICE: SHX6519

## 2019-04-18 HISTORY — PX: TEE WITHOUT CARDIOVERSION: SHX5443

## 2019-04-18 LAB — BASIC METABOLIC PANEL
Anion gap: 12 (ref 5–15)
Anion gap: 11 (ref 5–15)
BUN: 45 mg/dL — ABNORMAL HIGH (ref 8–23)
BUN: 38 mg/dL — ABNORMAL HIGH (ref 8–23)
CO2: 25 mmol/L (ref 22–32)
CO2: 27 mmol/L (ref 22–32)
Calcium: 9.1 mg/dL (ref 8.9–10.3)
Calcium: 8.2 mg/dL — ABNORMAL LOW (ref 8.9–10.3)
Chloride: 95 mmol/L — ABNORMAL LOW (ref 98–111)
Chloride: 93 mmol/L — ABNORMAL LOW (ref 98–111)
Creatinine, Ser: 1.52 mg/dL — ABNORMAL HIGH (ref 0.61–1.24)
Creatinine, Ser: 1.6 mg/dL — ABNORMAL HIGH (ref 0.61–1.24)
GFR calc Af Amer: 56 mL/min — ABNORMAL LOW (ref >60)
GFR calc Af Amer: 53 mL/min — ABNORMAL LOW (ref >60)
GFR calc non Af Amer: 49 mL/min — ABNORMAL LOW (ref >60)
GFR calc non Af Amer: 46 mL/min — ABNORMAL LOW (ref >60)
Glucose, Bld: 80 mg/dL (ref 70–99)
Glucose, Bld: 105 mg/dL — ABNORMAL HIGH (ref 70–99)
Potassium: 4.7 mmol/L (ref 3.5–5.1)
Potassium: 4.5 mmol/L (ref 3.5–5.1)
Sodium: 132 mmol/L — ABNORMAL LOW (ref 135–145)
Sodium: 131 mmol/L — ABNORMAL LOW (ref 135–145)

## 2019-04-18 LAB — GLUCOSE, CAPILLARY
Glucose-Capillary: 146 mg/dL — ABNORMAL HIGH (ref 70–99)
Glucose-Capillary: 80 mg/dL (ref 70–99)
Glucose-Capillary: 86 mg/dL (ref 70–99)
Glucose-Capillary: 177 mg/dL — ABNORMAL HIGH (ref 70–99)
Glucose-Capillary: 42 mg/dL — CL (ref 70–99)
Glucose-Capillary: 66 mg/dL — ABNORMAL LOW (ref 70–99)
Glucose-Capillary: 111 mg/dL — ABNORMAL HIGH (ref 70–99)
Glucose-Capillary: 103 mg/dL — ABNORMAL HIGH (ref 70–99)
Glucose-Capillary: 114 mg/dL — ABNORMAL HIGH (ref 70–99)
Glucose-Capillary: 115 mg/dL — ABNORMAL HIGH (ref 70–99)
Glucose-Capillary: 98 mg/dL (ref 70–99)
Glucose-Capillary: 96 mg/dL (ref 70–99)
Glucose-Capillary: 105 mg/dL — ABNORMAL HIGH (ref 70–99)
Glucose-Capillary: 98 mg/dL (ref 70–99)
Glucose-Capillary: 117 mg/dL — ABNORMAL HIGH (ref 70–99)
Glucose-Capillary: 119 mg/dL — ABNORMAL HIGH (ref 70–99)
Glucose-Capillary: 124 mg/dL — ABNORMAL HIGH (ref 70–99)

## 2019-04-18 LAB — COOXEMETRY PANEL
Carboxyhemoglobin: 1.4 % (ref 0.5–1.5)
Carboxyhemoglobin: 1.6 % — ABNORMAL HIGH (ref 0.5–1.5)
Carboxyhemoglobin: 1.1 % (ref 0.5–1.5)
Methemoglobin: 1.2 % (ref 0.0–1.5)
Methemoglobin: 1.3 % (ref 0.0–1.5)
Methemoglobin: 0.7 % (ref 0.0–1.5)
O2 Saturation: 59.6 %
O2 Saturation: 73.5 %
O2 Saturation: 69.5 %
Total hemoglobin: 10.1 g/dL — ABNORMAL LOW (ref 12.0–16.0)
Total hemoglobin: 8.3 g/dL — ABNORMAL LOW (ref 12.0–16.0)
Total hemoglobin: 10.1 g/dL — ABNORMAL LOW (ref 12.0–16.0)

## 2019-04-18 LAB — POCT I-STAT 7, (LYTES, BLD GAS, ICA,H+H)
Acid-Base Excess: 4 mmol/L — ABNORMAL HIGH (ref 0.0–2.0)
Acid-Base Excess: 5 mmol/L — ABNORMAL HIGH (ref 0.0–2.0)
Bicarbonate: 28.4 mmol/L — ABNORMAL HIGH (ref 20.0–28.0)
Bicarbonate: 28.6 mmol/L — ABNORMAL HIGH (ref 20.0–28.0)
Calcium, Ion: 1.24 mmol/L (ref 1.15–1.40)
Calcium, Ion: 1.2 mmol/L (ref 1.15–1.40)
HCT: 32 % — ABNORMAL LOW (ref 39.0–52.0)
HCT: 28 % — ABNORMAL LOW (ref 39.0–52.0)
Hemoglobin: 10.9 g/dL — ABNORMAL LOW (ref 13.0–17.0)
Hemoglobin: 9.5 g/dL — ABNORMAL LOW (ref 13.0–17.0)
O2 Saturation: 99 %
O2 Saturation: 100 %
Potassium: 4 mmol/L (ref 3.5–5.1)
Potassium: 3.8 mmol/L (ref 3.5–5.1)
Sodium: 131 mmol/L — ABNORMAL LOW (ref 135–145)
Sodium: 131 mmol/L — ABNORMAL LOW (ref 135–145)
TCO2: 30 mmol/L (ref 22–32)
TCO2: 30 mmol/L (ref 22–32)
pCO2 arterial: 40.9 mmHg (ref 32.0–48.0)
pCO2 arterial: 37.7 mmHg (ref 32.0–48.0)
pH, Arterial: 7.45 (ref 7.350–7.450)
pH, Arterial: 7.488 — ABNORMAL HIGH (ref 7.350–7.450)
pO2, Arterial: 124 mmHg — ABNORMAL HIGH (ref 83.0–108.0)
pO2, Arterial: 311 mmHg — ABNORMAL HIGH (ref 83.0–108.0)

## 2019-04-18 LAB — CBC
HCT: 30.4 % — ABNORMAL LOW (ref 39.0–52.0)
HCT: 31.7 % — ABNORMAL LOW (ref 39.0–52.0)
Hemoglobin: 9.9 g/dL — ABNORMAL LOW (ref 13.0–17.0)
Hemoglobin: 10.5 g/dL — ABNORMAL LOW (ref 13.0–17.0)
MCH: 28.1 pg (ref 26.0–34.0)
MCH: 28.2 pg (ref 26.0–34.0)
MCHC: 32.6 g/dL (ref 30.0–36.0)
MCHC: 33.1 g/dL (ref 30.0–36.0)
MCV: 86.4 fL (ref 80.0–100.0)
MCV: 85 fL (ref 80.0–100.0)
Platelets: 256 10*3/uL (ref 150–400)
Platelets: 210 10*3/uL (ref 150–400)
RBC: 3.52 MIL/uL — ABNORMAL LOW (ref 4.22–5.81)
RBC: 3.73 MIL/uL — ABNORMAL LOW (ref 4.22–5.81)
RDW: 13.2 % (ref 11.5–15.5)
RDW: 13.7 % (ref 11.5–15.5)
WBC: 17.5 10*3/uL — ABNORMAL HIGH (ref 4.0–10.5)
WBC: 17.2 10*3/uL — ABNORMAL HIGH (ref 4.0–10.5)
nRBC: 0 % (ref 0.0–0.2)
nRBC: 0 % (ref 0.0–0.2)

## 2019-04-18 LAB — POCT I-STAT, CHEM 8
BUN: 38 mg/dL — ABNORMAL HIGH (ref 8–23)
BUN: 37 mg/dL — ABNORMAL HIGH (ref 8–23)
BUN: 36 mg/dL — ABNORMAL HIGH (ref 8–23)
Calcium, Ion: 1.23 mmol/L (ref 1.15–1.40)
Calcium, Ion: 1.23 mmol/L (ref 1.15–1.40)
Calcium, Ion: 1.22 mmol/L (ref 1.15–1.40)
Chloride: 91 mmol/L — ABNORMAL LOW (ref 98–111)
Chloride: 93 mmol/L — ABNORMAL LOW (ref 98–111)
Chloride: 94 mmol/L — ABNORMAL LOW (ref 98–111)
Creatinine, Ser: 1.8 mg/dL — ABNORMAL HIGH (ref 0.61–1.24)
Creatinine, Ser: 1.6 mg/dL — ABNORMAL HIGH (ref 0.61–1.24)
Creatinine, Ser: 1.7 mg/dL — ABNORMAL HIGH (ref 0.61–1.24)
Glucose, Bld: 128 mg/dL — ABNORMAL HIGH (ref 70–99)
Glucose, Bld: 107 mg/dL — ABNORMAL HIGH (ref 70–99)
Glucose, Bld: 91 mg/dL (ref 70–99)
HCT: 29 % — ABNORMAL LOW (ref 39.0–52.0)
HCT: 26 % — ABNORMAL LOW (ref 39.0–52.0)
HCT: 27 % — ABNORMAL LOW (ref 39.0–52.0)
Hemoglobin: 9.9 g/dL — ABNORMAL LOW (ref 13.0–17.0)
Hemoglobin: 8.8 g/dL — ABNORMAL LOW (ref 13.0–17.0)
Hemoglobin: 9.2 g/dL — ABNORMAL LOW (ref 13.0–17.0)
Potassium: 4 mmol/L (ref 3.5–5.1)
Potassium: 3.8 mmol/L (ref 3.5–5.1)
Potassium: 3.9 mmol/L (ref 3.5–5.1)
Sodium: 130 mmol/L — ABNORMAL LOW (ref 135–145)
Sodium: 131 mmol/L — ABNORMAL LOW (ref 135–145)
Sodium: 131 mmol/L — ABNORMAL LOW (ref 135–145)
TCO2: 28 mmol/L (ref 22–32)
TCO2: 30 mmol/L (ref 22–32)
TCO2: 31 mmol/L (ref 22–32)

## 2019-04-18 LAB — ECHO INTRAOPERATIVE TEE
Height: 71 in
Weight: 2447.99 oz

## 2019-04-18 LAB — PHOSPHORUS
Phosphorus: 4.1 mg/dL (ref 2.5–4.6)
Phosphorus: 4.4 mg/dL (ref 2.5–4.6)

## 2019-04-18 LAB — LACTATE DEHYDROGENASE: LDH: 366 U/L — ABNORMAL HIGH (ref 98–192)

## 2019-04-18 LAB — FIBRINOGEN: Fibrinogen: 415 mg/dL (ref 210–475)

## 2019-04-18 LAB — MAGNESIUM
Magnesium: 2.1 mg/dL (ref 1.7–2.4)
Magnesium: 1.9 mg/dL (ref 1.7–2.4)
Magnesium: 1.9 mg/dL (ref 1.7–2.4)

## 2019-04-18 LAB — POCT ACTIVATED CLOTTING TIME: Activated Clotting Time: 131 seconds

## 2019-04-18 LAB — PREPARE RBC (CROSSMATCH)

## 2019-04-18 LAB — HEPARIN LEVEL (UNFRACTIONATED)
Heparin Unfractionated: 0.46 IU/mL (ref 0.30–0.70)
Heparin Unfractionated: <0.1 IU/mL — ABNORMAL LOW (ref 0.30–0.70)

## 2019-04-18 LAB — LACTIC ACID, PLASMA: Lactic Acid, Venous: 1.2 mmol/L (ref 0.5–1.9)

## 2019-04-18 SURGERY — INSERTION, CARDIAC ASSIST DEVICE, IMPELLA
Anesthesia: General | Site: Axilla | Laterality: Right

## 2019-04-18 MED ORDER — SODIUM CHLORIDE 0.9 % IV SOLN
INTRAVENOUS | Status: DC | PRN
  Administered 2019-04-18: 500 mL

## 2019-04-18 MED ORDER — INSULIN REGULAR(HUMAN) IN NACL 100-0.9 UT/100ML-% IV SOLN
INTRAVENOUS | Status: DC
  Administered 2019-04-18: 0.3 [IU]/h via INTRAVENOUS
  Administered 2019-04-19: 5.5 [IU]/h via INTRAVENOUS
  Filled 2019-04-18: qty 100

## 2019-04-18 MED ORDER — HEPARIN SODIUM (PORCINE) 5000 UNIT/ML IJ SOLN
50000.0000 [IU] | INTRAVENOUS | Status: DC
  Administered 2019-04-18 – 2019-04-21 (×3): 50000 [IU]
  Filled 2019-04-18 (×2): qty 10

## 2019-04-18 MED ORDER — DEXAMETHASONE SODIUM PHOSPHATE 10 MG/ML IJ SOLN
INTRAMUSCULAR | Status: AC
  Filled 2019-04-18: qty 1

## 2019-04-18 MED ORDER — ROCURONIUM BROMIDE 10 MG/ML (PF) SYRINGE
PREFILLED_SYRINGE | INTRAVENOUS | Status: DC | PRN
  Administered 2019-04-18 (×2): 40 mg via INTRAVENOUS
  Administered 2019-04-18: 60 mg via INTRAVENOUS
  Administered 2019-04-18: 40 mg via INTRAVENOUS

## 2019-04-18 MED ORDER — LACTATED RINGERS IV SOLN: INTRAVENOUS | Status: DC

## 2019-04-18 MED ORDER — LACTATED RINGERS IV SOLN: INTRAVENOUS | Status: DC | PRN

## 2019-04-18 MED ORDER — FENTANYL CITRATE (PF) 100 MCG/2ML IJ SOLN
INTRAMUSCULAR | Status: DC | PRN
  Administered 2019-04-18 (×2): 50 ug via INTRAVENOUS
  Administered 2019-04-18: 100 ug via INTRAVENOUS

## 2019-04-18 MED ORDER — VITAL 1.5 CAL PO LIQD
1000.0000 mL | ORAL | Status: DC
  Administered 2019-04-18: 1000 mL
  Filled 2019-04-18 (×3): qty 1000

## 2019-04-18 MED ORDER — DEXTROSE 50 % IV SOLN: 25.0000 g | INTRAVENOUS | Status: AC

## 2019-04-18 MED ORDER — ETOMIDATE 2 MG/ML IV SOLN
INTRAVENOUS | Status: AC
  Filled 2019-04-18: qty 10

## 2019-04-18 MED ORDER — LIDOCAINE IN D5W 4-5 MG/ML-% IV SOLN
1.0000 mg/min | INTRAVENOUS | Status: DC
  Administered 2019-04-18 (×2): 1 mg/min via INTRAVENOUS
  Filled 2019-04-18: qty 500

## 2019-04-18 MED ORDER — DEXMEDETOMIDINE HCL IN NACL 400 MCG/100ML IV SOLN
0.0000 ug/kg/h | INTRAVENOUS | Status: DC
  Administered 2019-04-18: 0.4 ug/kg/h via INTRAVENOUS
  Administered 2019-04-19 (×3): 0.8 ug/kg/h via INTRAVENOUS
  Filled 2019-04-18 (×2): qty 100
  Filled 2019-04-18: qty 200
  Filled 2019-04-18 (×2): qty 100

## 2019-04-18 MED ORDER — PRO-STAT SUGAR FREE PO LIQD
30.0000 mL | Freq: Two times a day (BID) | ORAL | Status: DC
  Administered 2019-04-18 – 2019-04-19 (×4): 30 mL
  Filled 2019-04-18 (×3): qty 30

## 2019-04-18 MED ORDER — ONDANSETRON HCL 4 MG/2ML IJ SOLN
INTRAMUSCULAR | Status: AC
  Filled 2019-04-18: qty 2

## 2019-04-18 MED ORDER — MIDAZOLAM HCL 2 MG/2ML IJ SOLN
INTRAMUSCULAR | Status: AC
  Filled 2019-04-18: qty 2

## 2019-04-18 MED ORDER — HEPARIN SODIUM (PORCINE) 1000 UNIT/ML IJ SOLN
INTRAMUSCULAR | Status: DC | PRN
  Administered 2019-04-18: 4000 [IU] via INTRAVENOUS
  Administered 2019-04-18: 2000 [IU] via INTRAVENOUS

## 2019-04-18 MED ORDER — DEXTROSE 5 % SOLN FOR IMPELLA PURGE CATHETER
INTRAVENOUS | Status: DC
  Filled 2019-04-18: qty 1000

## 2019-04-18 MED ORDER — HEMOSTATIC AGENTS (NO CHARGE) OPTIME
TOPICAL | Status: DC | PRN
  Administered 2019-04-18: 1 via TOPICAL

## 2019-04-18 MED ORDER — HEPARIN SODIUM (PORCINE) 5000 UNIT/ML IJ SOLN
50000.0000 [IU] | INTRAVENOUS | Status: DC
  Filled 2019-04-18 (×2): qty 10

## 2019-04-18 MED ORDER — IODIXANOL 320 MG/ML IV SOLN
INTRAVENOUS | Status: DC | PRN
  Administered 2019-04-18: 10:00:00 50 mL

## 2019-04-18 MED ORDER — DEXTROSE 50 % IV SOLN
INTRAVENOUS | Status: AC
  Administered 2019-04-18: 25 g via INTRAVENOUS
  Filled 2019-04-18: qty 50

## 2019-04-18 MED ORDER — VITAL 1.5 CAL PO LIQD
1000.0000 mL | ORAL | Status: DC
  Administered 2019-04-19 – 2019-04-20 (×2): 1000 mL
  Filled 2019-04-18 (×3): qty 1000

## 2019-04-18 MED ORDER — ROCURONIUM BROMIDE 10 MG/ML (PF) SYRINGE
PREFILLED_SYRINGE | INTRAVENOUS | Status: AC
  Filled 2019-04-18: qty 10

## 2019-04-18 MED ORDER — HEPARIN (PORCINE) 25000 UT/250ML-% IV SOLN
550.0000 [IU]/h | INTRAVENOUS | Status: DC
  Administered 2019-04-18: 150 [IU]/h via INTRAVENOUS
  Filled 2019-04-18: qty 250

## 2019-04-18 MED ORDER — 0.9 % SODIUM CHLORIDE (POUR BTL) OPTIME
TOPICAL | Status: DC | PRN
  Administered 2019-04-18: 4000 mL

## 2019-04-18 MED ORDER — PROPOFOL 10 MG/ML IV BOLUS
INTRAVENOUS | Status: AC
  Filled 2019-04-18: qty 20

## 2019-04-18 MED ORDER — SODIUM CHLORIDE 0.9 % IV SOLN
INTRAVENOUS | Status: AC
  Filled 2019-04-18: qty 1.2

## 2019-04-18 MED ORDER — FENTANYL CITRATE (PF) 250 MCG/5ML IJ SOLN
INTRAMUSCULAR | Status: AC
  Filled 2019-04-18: qty 5

## 2019-04-18 MED ORDER — MIDAZOLAM HCL 2 MG/2ML IJ SOLN
INTRAMUSCULAR | Status: DC | PRN
  Administered 2019-04-18: 2 mg via INTRAVENOUS

## 2019-04-18 SURGICAL SUPPLY — 152 items
ADAPTER CARDIO PERF ANTE/RETRO (ADAPTER) ×7 IMPLANT
ADPR PRFSN 84XANTGRD RTRGD (ADAPTER) ×3
AGENT HMST KT MTR STRL THRMB (HEMOSTASIS) ×3
ANCHOR CATH FOLEY SECURE (MISCELLANEOUS) ×9 IMPLANT
APL SRG 7X2 LUM MLBL SLNT (VASCULAR PRODUCTS)
APL SWBSTK 6 STRL LF DISP (MISCELLANEOUS)
APPLICATOR COTTON TIP 6 STRL (MISCELLANEOUS) IMPLANT
APPLICATOR COTTON TIP 6IN STRL (MISCELLANEOUS)
APPLICATOR TIP COSEAL (VASCULAR PRODUCTS) IMPLANT
BAG DECANTER FOR FLEXI CONT (MISCELLANEOUS) ×10 IMPLANT
BASKET HEART  (ORDER IN 25'S) (MISCELLANEOUS) ×1
BASKET HEART (ORDER IN 25'S) (MISCELLANEOUS) ×4
BASKET HEART (ORDER IN 25S) (MISCELLANEOUS) ×3 IMPLANT
BLADE CLIPPER SURG (BLADE) ×10 IMPLANT
BLADE STERNUM SYSTEM 6 (BLADE) ×7 IMPLANT
BLADE SURG 11 STRL SS (BLADE) ×3 IMPLANT
BLADE SURG 12 STRL SS (BLADE) ×10 IMPLANT
BLADE SURG 15 STRL LF DISP TIS (BLADE) ×3 IMPLANT
BLADE SURG 15 STRL SS (BLADE) ×5
BNDG ELASTIC 4X5.8 VLCR STR LF (GAUZE/BANDAGES/DRESSINGS) ×5 IMPLANT
BNDG ELASTIC 6X5.8 VLCR STR LF (GAUZE/BANDAGES/DRESSINGS) ×5 IMPLANT
BNDG GAUZE ELAST 4 BULKY (GAUZE/BANDAGES/DRESSINGS) ×5 IMPLANT
BOOT SUTURE AID YELLOW STND (SUTURE) IMPLANT
CANISTER SUCT 3000ML PPV (MISCELLANEOUS) ×10 IMPLANT
CANNULA GUNDRY RCSP 15FR (MISCELLANEOUS) ×10 IMPLANT
CANNULA SUMP PERICARDIAL (CANNULA) ×5 IMPLANT
CATH ACCU-VU SIZ PIG 5F 100CM (CATHETERS) ×3 IMPLANT
CATH CPB KIT VANTRIGT (MISCELLANEOUS) ×5 IMPLANT
CATH DIAG EXPO 6F AL1 (CATHETERS) ×5 IMPLANT
CATH INFINITI 6F MPB2 (CATHETERS) ×5 IMPLANT
CATH ROBINSON RED A/P 18FR (CATHETERS) ×30 IMPLANT
CATH THORACIC 28FR RT ANG (CATHETERS) ×10 IMPLANT
CATH TROCAR 28FR (CATHETERS) ×8 IMPLANT
CLIP VESOCCLUDE MED 6/CT (CLIP) ×3 IMPLANT
CLIP VESOCCLUDE SM WIDE 24/CT (CLIP) ×5 IMPLANT
CLIP VESOCCLUDE SM WIDE 6/CT (CLIP) ×3 IMPLANT
CONN 1/2X1/2X1/2  BEN (MISCELLANEOUS) ×5
CONN 1/2X1/2X1/2 BEN (MISCELLANEOUS) ×3 IMPLANT
CONN 3/8X1/2 ST GISH (MISCELLANEOUS) ×10 IMPLANT
COVER SURGICAL LIGHT HANDLE (MISCELLANEOUS) ×2 IMPLANT
DRAIN CHANNEL 32F RND 10.7 FF (WOUND CARE) ×5 IMPLANT
DRAPE C-ARM 42X72 X-RAY (DRAPES) ×13 IMPLANT
DRAPE CARDIOVASCULAR INCISE (DRAPES) ×10
DRAPE CV SPLIT W-CLR ANES SCRN (DRAPES) ×5 IMPLANT
DRAPE PERI GROIN 82X75IN TIB (DRAPES) ×5 IMPLANT
DRAPE SLUSH/WARMER DISC (DRAPES) ×10 IMPLANT
DRAPE SRG 135X102X78XABS (DRAPES) ×6 IMPLANT
DRSG AQUACEL AG ADV 3.5X14 (GAUZE/BANDAGES/DRESSINGS) ×10 IMPLANT
DRSG TEGADERM 4X4.75 (GAUZE/BANDAGES/DRESSINGS) ×10 IMPLANT
ELECT BLADE 4.0 EZ CLEAN MEGAD (MISCELLANEOUS) ×5
ELECT BLADE 6.5 EXT (BLADE) ×10 IMPLANT
ELECT CAUTERY BLADE 6.4 (BLADE) ×10 IMPLANT
ELECT REM PT RETURN 9FT ADLT (ELECTROSURGICAL) ×20
ELECTRODE BLDE 4.0 EZ CLN MEGD (MISCELLANEOUS) ×3 IMPLANT
ELECTRODE REM PT RTRN 9FT ADLT (ELECTROSURGICAL) ×12 IMPLANT
FELT TEFLON 1X6 (MISCELLANEOUS) ×23 IMPLANT
GAUZE SPONGE 4X4 12PLY STRL (GAUZE/BANDAGES/DRESSINGS) ×25 IMPLANT
GAUZE SPONGE 4X4 12PLY STRL LF (GAUZE/BANDAGES/DRESSINGS) ×6 IMPLANT
GAUZE XEROFORM 5X9 LF (GAUZE/BANDAGES/DRESSINGS) ×5 IMPLANT
GLOVE BIO SURGEON STRL SZ 6 (GLOVE) IMPLANT
GLOVE BIO SURGEON STRL SZ 6.5 (GLOVE) ×2 IMPLANT
GLOVE BIO SURGEON STRL SZ7.5 (GLOVE) ×30 IMPLANT
GLOVE BIO SURGEONS STRL SZ 6.5 (GLOVE) ×1
GLOVE BIOGEL M STRL SZ7.5 (GLOVE) ×15 IMPLANT
GLOVE BIOGEL PI IND STRL 6 (GLOVE) ×3 IMPLANT
GLOVE BIOGEL PI IND STRL 6.5 (GLOVE) ×1 IMPLANT
GLOVE BIOGEL PI IND STRL 7.5 (GLOVE) ×1 IMPLANT
GLOVE BIOGEL PI INDICATOR 6 (GLOVE) ×2
GLOVE BIOGEL PI INDICATOR 6.5 (GLOVE) ×2
GLOVE BIOGEL PI INDICATOR 7.5 (GLOVE) ×2
GLOVE ECLIPSE 7.5 STRL STRAW (GLOVE) ×5 IMPLANT
GOWN STRL REUS W/ TWL LRG LVL3 (GOWN DISPOSABLE) ×28 IMPLANT
GOWN STRL REUS W/ TWL XL LVL3 (GOWN DISPOSABLE) ×1 IMPLANT
GOWN STRL REUS W/TWL LRG LVL3 (GOWN DISPOSABLE) ×60
GOWN STRL REUS W/TWL XL LVL3 (GOWN DISPOSABLE) ×5
GRAFT GELWEAVE IMPREG 10X30CM (Prosthesis & Implant Heart) ×3 IMPLANT
HEMOSTAT POWDER SURGIFOAM 1G (HEMOSTASIS) ×15 IMPLANT
HEMOSTAT SURGICEL 2X14 (HEMOSTASIS) ×5 IMPLANT
INSERT FOGARTY SM (MISCELLANEOUS) ×11 IMPLANT
INSERT FOGARTY XLG (MISCELLANEOUS) IMPLANT
KIT BASIN OR (CUSTOM PROCEDURE TRAY) ×10 IMPLANT
KIT SUCTION CATH 14FR (SUCTIONS) ×5 IMPLANT
KIT TURNOVER KIT B (KITS) ×10 IMPLANT
KIT VASOVIEW HEMOPRO 2 VH 4000 (KITS) ×5 IMPLANT
LEAD PACING MYOCARDI (MISCELLANEOUS) ×5 IMPLANT
LOOP VESSEL MINI RED (MISCELLANEOUS) ×5 IMPLANT
MARKER GRAFT CORONARY BYPASS (MISCELLANEOUS) ×15 IMPLANT
NS IRRIG 1000ML POUR BTL (IV SOLUTION) ×65 IMPLANT
PACK CHEST (CUSTOM PROCEDURE TRAY) ×5 IMPLANT
PACK E OPEN HEART (SUTURE) ×5 IMPLANT
PACK OPEN HEART (CUSTOM PROCEDURE TRAY) ×5 IMPLANT
PAD ARMBOARD 7.5X6 YLW CONV (MISCELLANEOUS) ×20 IMPLANT
PAD ELECT DEFIB RADIOL ZOLL (MISCELLANEOUS) ×5 IMPLANT
PENCIL BUTTON HOLSTER BLD 10FT (ELECTRODE) ×8 IMPLANT
POSITIONER HEAD DONUT 9IN (MISCELLANEOUS) ×10 IMPLANT
PUMP SET IMPELLA 5.5 US (CATHETERS) ×3 IMPLANT
PUNCH AORTIC ROTATE 4.0MM (MISCELLANEOUS) IMPLANT
PUNCH AORTIC ROTATE 4.5MM 8IN (MISCELLANEOUS) IMPLANT
PUNCH AORTIC ROTATE 5MM 8IN (MISCELLANEOUS) IMPLANT
SEALANT SURG COSEAL 4ML (VASCULAR PRODUCTS) ×5 IMPLANT
SEALANT SURG COSEAL 8ML (VASCULAR PRODUCTS) ×5 IMPLANT
SPONGE LAP 18X18 X RAY DECT (DISPOSABLE) ×6 IMPLANT
STAPLER VISISTAT 35W (STAPLE) ×3 IMPLANT
STOPCOCK 4 WAY LG BORE MALE ST (IV SETS) ×3 IMPLANT
SURGIFLO W/THROMBIN 8M KIT (HEMOSTASIS) ×5 IMPLANT
SUT BONE WAX W31G (SUTURE) ×5 IMPLANT
SUT ETHILON 3 0 FSL (SUTURE) ×6 IMPLANT
SUT ETHILON 3 0 PS 1 (SUTURE) ×10 IMPLANT
SUT MNCRL AB 4-0 PS2 18 (SUTURE) IMPLANT
SUT PROLENE 3 0 RB 1 (SUTURE) ×5 IMPLANT
SUT PROLENE 3 0 SH 1 (SUTURE) ×5 IMPLANT
SUT PROLENE 3 0 SH DA (SUTURE) ×5 IMPLANT
SUT PROLENE 3 0 SH1 36 (SUTURE) IMPLANT
SUT PROLENE 4 0 RB 1 (SUTURE) ×15
SUT PROLENE 4 0 SH DA (SUTURE) ×5 IMPLANT
SUT PROLENE 4-0 RB1 .5 CRCL 36 (SUTURE) ×9 IMPLANT
SUT PROLENE 5 0 C 1 36 (SUTURE) ×6 IMPLANT
SUT PROLENE 6 0 C 1 30 (SUTURE) IMPLANT
SUT PROLENE 6 0 CC (SUTURE) ×18 IMPLANT
SUT PROLENE 8 0 BV175 6 (SUTURE) IMPLANT
SUT PROLENE BLUE 7 0 (SUTURE) ×5 IMPLANT
SUT SILK  1 MH (SUTURE) ×40
SUT SILK 1 MH (SUTURE) ×20 IMPLANT
SUT SILK 1 TIES 10X30 (SUTURE) ×5 IMPLANT
SUT SILK 2 0 SH CR/8 (SUTURE) ×3 IMPLANT
SUT SILK 3 0 SH CR/8 (SUTURE) IMPLANT
SUT STEEL 6MS V (SUTURE) ×10 IMPLANT
SUT STEEL SZ 6 DBL 3X14 BALL (SUTURE) ×5 IMPLANT
SUT VIC AB 1 CTX 18 (SUTURE) ×3 IMPLANT
SUT VIC AB 1 CTX 36 (SUTURE) ×10
SUT VIC AB 1 CTX36XBRD ANBCTR (SUTURE) ×6 IMPLANT
SUT VIC AB 2-0 CT1 27 (SUTURE)
SUT VIC AB 2-0 CT1 TAPERPNT 27 (SUTURE) IMPLANT
SUT VIC AB 2-0 CTX 27 (SUTURE) IMPLANT
SUT VIC AB 2-0 CTX 36 (SUTURE) ×3 IMPLANT
SUT VIC AB 3-0 SH 8-18 (SUTURE) ×3 IMPLANT
SUT VIC AB 3-0 X1 27 (SUTURE) IMPLANT
SWAB CULTURE ESWAB REG 1ML (MISCELLANEOUS) ×3 IMPLANT
SWAB CULTURE LIQUID MINI MALE (MISCELLANEOUS) ×5 IMPLANT
SYR 20ML LL LF (SYRINGE) ×6 IMPLANT
SYR 50ML SLIP (SYRINGE) ×3 IMPLANT
SYSTEM SAHARA CHEST DRAIN ATS (WOUND CARE) ×13 IMPLANT
TAPE CLOTH SURG 4X10 WHT LF (GAUZE/BANDAGES/DRESSINGS) ×6 IMPLANT
TOWEL GREEN STERILE (TOWEL DISPOSABLE) ×15 IMPLANT
TOWEL GREEN STERILE FF (TOWEL DISPOSABLE) ×5 IMPLANT
TRAY FOLEY SLVR 16FR TEMP STAT (SET/KITS/TRAYS/PACK) ×5 IMPLANT
TUBE CONNECTING 12'X1/4 (SUCTIONS) ×1
TUBE CONNECTING 12X1/4 (SUCTIONS) ×2 IMPLANT
TUBING LAP HI FLOW INSUFFLATIO (TUBING) ×5 IMPLANT
UNDERPAD 30X30 (UNDERPADS AND DIAPERS) ×10 IMPLANT
WATER STERILE IRR 1000ML POUR (IV SOLUTION) ×20 IMPLANT
WIRE EMERALD 3MM-J .035X260CM (WIRE) ×5 IMPLANT

## 2019-04-18 NOTE — Transfer of Care (Signed)
Immediate Anesthesia Transfer of Care Note  Patient: Brendan [name]  Procedure(s) Performed: Placement of Impella 5.5 Direct (Right Axilla) TRANSESOPHAGEAL ECHOCARDIOGRAM (TEE) (N/A )  Patient Location: SICU  Anesthesia Type:General  Level of Consciousness: sedated and Patient remains intubated per anesthesia plan  Airway & Oxygen Therapy: Patient remains intubated per anesthesia plan and Patient placed on Ventilator (see vital sign flow sheet for setting)  Post-op Assessment: Report given to RN and Post -op Vital signs reviewed and stable  Post vital signs: Reviewed and stable  Last Vitals:  Vitals Value Taken Time  BP    Temp    Pulse    Resp    SpO2      Last Pain:  Vitals:   04/24/2019 0400  TempSrc: Core  PainSc:       Patients Stated Pain Goal: 0 (84/03/75 4360)  Complications: No apparent anesthesia complications

## 2019-04-18 NOTE — Progress Notes (Signed)
      [address]       [place] [address]             [phone]      S/p Impella  Pump functioning well  Plan per Advanced heart failure  Remo Lipps C. Roxan Hockey, MD [facility] [phone]'

## 2019-04-18 NOTE — Progress Notes (Signed)
Hypoglycemic Event  CBG: 42   Treatment: D50 50 mL (25 gm)  Symptoms: None  Follow-up CBG: RAJH:1834 CBG Result:177  Possible Reasons for Event: Other: NPO for surgery  Comments/MD notified:eLink notified, pt going to surgery within the hr.     Brendan [name] L Arnesha Schiraldi

## 2019-04-18 NOTE — Progress Notes (Signed)
Dr Nils Pyle on phone for update. Pt rested well overnight after intubation, diuresed well on lasix gtt, aware of hypoglycemic episodes. Plan for Impella and bilat chest tubes this am.

## 2019-04-18 NOTE — Progress Notes (Signed)
[facility] Progress Note Patient Name: Brendan [name] DOB: 12-17-1955 MRN: [id number]   Date of Service  04/30/2019  HPI/Events of Note  Agitated. Pulling at ETT. RN requests soft bilateral wrist restraints.   eICU Interventions  Restraints ordered to maintain patient safety.     Intervention Category Minor Interventions: Agitation / anxiety - evaluation and management  Brendan [name] 05/05/2019, 2:06 AM

## 2019-04-18 NOTE — Progress Notes (Signed)
Day of Surgery Procedure(s) (LRB): Placement of Impella 5.5 Direct (Right) TRANSESOPHAGEAL ECHOCARDIOGRAM (TEE) (N/A) Subjective: Patient stable intubated after placement of Impella 5.5 and drainage of bilateral 3 L pleural effusions Chest x-ray significantly improved Plan to leave patient in intubated overnight Tube feeding started-May need to switch to core track to continue nutrition because of severe protein mall nutrition  Hope to return to the OR Tuesday for CABG mitral valve replacement Objective: Vital signs in last 24 hours: Temp:  [96.3 F (35.7 C)-99.5 F (37.5 C)] 97.2 F (36.2 C) (04/02 1400) Pulse Rate:  [68-85] 68 (04/02 1300) Cardiac Rhythm: Normal sinus rhythm (04/02 1200) Resp:  [12-32] 13 (04/02 1400) BP: (87-145)/(44-102) 103/76 (04/02 1400) SpO2:  [83 %-100 %] 100 % (04/02 1300) Arterial Line BP: (79-152)/(37-79) 131/71 (04/02 1400) FiO2 (%):  [50 %-100 %] 60 % (04/02 1200) Weight:  [69.4 kg] 69.4 kg (04/02 0630)  Hemodynamic parameters for last 24 hours: PAP: (30-70)/(9-37) 32/10 CVP:  [2 mmHg-21 mmHg] 3 mmHg PCWP:  [22 mmHg] 22 mmHg CO:  [4.8 L/min-5.9 L/min] 5.5 L/min CI:  [2.6 L/min/m2-3.2 L/min/m2] 2.9 L/min/m2  Intake/Output from previous day: 04/01 0701 - 04/02 0700 In: 2382 [P.O.:250; I.V.:2042; NG/GT:90] Out: 3270 [Urine:3070; Emesis/NG output:200] Intake/Output this shift: Total I/O In: 1435.3 [I.V.:1000.4; Blood:315; Other:19.9; IV Piggyback:100] Out: 4260 [Urine:1920; Other:2000; Blood:250; Chest Tube:90]    Lab Results: Recent Labs    05/05/2019 1830 05/09/2019 2127 05/13/2019 0348 04/24/2019 0820 04/24/2019 0916 05/04/2019 1018  WBC 11.2*  --  17.5*  --   --   --   HGB 9.7*   < > 9.9*   < > 9.5* 9.2*  HCT 29.3*   < > 30.4*   < > 28.0* 27.0*  PLT 261  --  256  --   --   --    < > = values in this interval not displayed.   BMET:  Recent Labs    05/07/2019 1830 04/20/2019 2127 05/07/2019 0348 04/28/2019 0820 04/24/2019 0912 05/08/2019 0912  05/10/2019 0916 05/11/2019 1018  NA 133*   < > 132*   < > 131*   < > 131* 131*  K 4.9   < > 4.7   < > 3.8   < > 3.8 3.9  CL 98   < > 95*   < > 93*  --   --  94*  CO2 24  --  25  --   --   --   --   --   GLUCOSE 158*   < > 80   < > 107*  --   --  91  BUN 45*   < > 45*   < > 37*  --   --  36*  CREATININE 1.56*   < > 1.52*   < > 1.60*  --   --  1.70*  CALCIUM 9.0  --  9.1  --   --   --   --   --    < > = values in this interval not displayed.    PT/INR: No results for input(s): LABPROT, INR in the last 72 hours. ABG    Component Value Date/Time   PHART 7.488 (H) 04/19/2019 0916   HCO3 28.6 (H) 05/07/2019 0916   TCO2 31 05/06/2019 1018   ACIDBASEDEF 3.0 (H) 04/24/2019 0853   ACIDBASEDEF 2.0 05/10/2019 0853   O2SAT 100.0 04/24/2019 0916   CBG (last 3)  Recent Labs    04/19/2019 0634 05/13/2019 1227 04/27/2019  [place]* 111* 103*    Assessment/Plan: S/P Procedure(s) (LRB): Placement of Impella 5.5 Direct (Right) TRANSESOPHAGEAL ECHOCARDIOGRAM (TEE) (N/A) Impella 5.5 placed in balloon pump removed Bilateral chest tubes removed 3 L from each pleural space Nutrition to start via NG tube Plan CABG with mitral valve replacement Tuesday next week Heparin strategy discussed with pharmacy for Impella 5.5   LOS: 4 days    [name] 04/27/2019

## 2019-04-18 NOTE — Progress Notes (Signed)
OT Cancellation Note  Patient Details Name: Brendan [name] MRN: [id number] DOB: 1955/03/22   Cancelled Treatment:    Reason Eval/Treat Not Completed: Patient not medically ready Pt with status change, plan for impella placement. OT will sign off at this time until pt medically ready. Please reconsult as pt medically improves. Thank you.  Zenovia Jarred, MSOT, OTR/L Acute Rehabilitation Services [facility] Office Number: [phone] Pager: [phone]  Zenovia Jarred 04/27/2019, 10:30 AM

## 2019-04-18 NOTE — Progress Notes (Signed)
[facility] Progress Note Patient Name: BRANNDON TUITE DOB: December 28, 1955 MRN: [id number]   Date of Service  04/26/2019  HPI/Events of Note  Request for CXR. Talked to bedside RT and the reason for request was because she made adjustment on the ET tube  eICU Interventions  CXR ordered     Intervention Category Major Interventions: Airway management  Judd Lien 05/05/2019, 9:48 PM

## 2019-04-18 NOTE — Progress Notes (Signed)
PT Cancellation/DC Note  Patient Details Name: Brendan [name] MRN: [id number] DOB: Oct 30, 1955   Cancelled Treatment:    Reason Eval/Treat Not Completed: Medical issues which prohibited therapy;Patient at procedure or test/unavailable;Patient not medically ready. Will sign off at this time. Please re-order when appropriate.    Bohdan 04/27/2019, 8:17 AM [place] Pager [phone] Office [phone]

## 2019-04-18 NOTE — Progress Notes (Signed)
   Patient underwent Impella 5.5 placement earlier today and bilateral chest tubes with 3L out from each side.   Urine output brisk throughout the afternoon (up to 300cc/hr).   CVP low and I stopped lasix drip midafternoon.   Swan numbers stable.   Impella remains at P-8. No alarms Good waveforms   On NE 12 and milrinone. BP stable.   Rhythm stable on amio and lido. I have stopped lido.   D/w Dr. Prescott Gum. Plan to try to extubate over w/e. Start TFs.   Will check BMET and CBC now.   Potential CABG and MVR on Tuesday.   Additional CCT 35 mins.   Glori Bickers, MD  7:49 PM

## 2019-04-18 NOTE — Progress Notes (Signed)
Advanced Heart Failure Rounding Note  PCP-Cardiologist: No primary care provider on file.   Subjective:   Cath 3/30 with severe 3 vessel disease. EF 25%  Developed PMVT on milrinone -> milrinone stopped -> developed worsening shock -> IABP placed.   Overnight developed worsening shock and respiratory failure despite IABP and inotropic support.  Now intubated on NE 3 and milrinone 0.25  Diuresed well overnight. Hemodynamics improved.   Co-ox 70% this am. Creatinine stable at 1.5. Chest CT with pulmonary edema and large bilateral effusions.   Sedated on vent   Swan RA 11 PA 52/20 Thermo 5.3/2.8 SVR 949  Objective:   Weight Range: 69.4 kg Body mass index is 21.34 kg/m.   Vital Signs:   Temp:  [96.3 F (35.7 C)-99.5 F (37.5 C)] 99.1 F (37.3 C) (04/02 0715) Pulse Rate:  [69-87] 77 (04/02 0715) Resp:  [12-32] 12 (04/02 0715) BP: (87-147)/(44-108) 121/76 (04/02 0700) SpO2:  [83 %-100 %] 97 % (04/02 0715) Arterial Line BP: (79-152)/(37-50) 128/43 (04/02 0715) FiO2 (%):  [50 %-100 %] 60 % (04/02 0431) Weight:  [69.4 kg] 69.4 kg (04/02 0630) Last BM Date: 03/24/2019(per report)  Weight change: Filed Weights   04/16/19 0600 05/04/2019 0452 04/27/2019 0630  Weight: 65.6 kg 68.4 kg 69.4 kg    Intake/Output:   Intake/Output Summary (Last 24 hours) at 04/27/2019 0955 Last data filed at 05/14/2019 0845 Gross per 24 hour  Intake 2375.94 ml  Output 3720 ml  Net -1344.06 ml      Physical Exam   General:  Ill appearing. Sedated on vent HEENT: normal + ETT Neck: supple.RIJ swan. Carotids 2+ bilat; no bruits. No lymphadenopathy or thryomegaly appreciated. Cor: PMI nondisplaced. Regular rate & rhythm. No rubs, gallops or murmurs. Lungs: + crackles. Dull at bases Abdomen: soft, nontender, nondistended. No hepatosplenomegaly. No bruits or masses. Good bowel sounds. Extremities: no cyanosis, clubbing, rash, tr edema. RFA IABP Neuro: intubated sedated    Telemetry   SR  80s Personally reviewed   Labs    CBC Recent Labs    04/20/2019 1830 05/15/2019 1830 05/13/2019 2127 04/20/2019 0348  WBC 11.2*  --   --  17.5*  HGB 9.7*   < > 10.2* 9.9*  HCT 29.3*   < > 30.0* 30.4*  MCV 86.9  --   --  86.4  PLT 261  --   --  256   < > = values in this interval not displayed.   Basic Metabolic Panel Recent Labs    05/08/2019 0311 04/28/2019 0853 05/14/2019 1830 04/24/2019 1830 05/16/2019 2127 05/11/2019 0348  NA 131*   < > 133*   < > 132* 132*  K 4.8   < > 4.9   < > 4.9 4.7  CL 94*  --  98  --   --  95*  CO2 23  --  24  --   --  25  GLUCOSE 412*  --  158*  --   --  80  BUN 38*  --  45*  --   --  45*  CREATININE 1.13  --  1.56*  --   --  1.52*  CALCIUM 8.5*  --  9.0  --   --  9.1  MG 2.2  --   --   --   --  2.1   < > = values in this interval not displayed.   Liver Function Tests Recent Labs    04/16/19 0448  AST  ALT 18  °ALKPHOS 83  °BILITOT 0.6  °PROT 5.3*  °ALBUMIN 2.5*  ° °No results for input(s): LIPASE, AMYLASE in the last 72 hours. °Cardiac Enzymes °No results for input(s): CKTOTAL, CKMB, CKMBINDEX, TROPONINI in the last 72 hours. ° °BNP: °BNP (last 3 results) °Recent Labs  °  04/13/2019 °0113  °BNP 655.7*  ° ° °ProBNP (last 3 results) °No results for input(s): PROBNP in the last 8760 hours. ° ° °D-Dimer °No results for input(s): DDIMER in the last 72 hours. °Hemoglobin A1C °No results for input(s): HGBA1C in the last 72 hours. °Fasting Lipid Panel °No results for input(s): CHOL, HDL, LDLCALC, TRIG, CHOLHDL, LDLDIRECT in the last 72 hours. °Thyroid Function Tests °No results for input(s): TSH, T4TOTAL, T3FREE, THYROIDAB in the last 72 hours. ° °Invalid input(s): FREET3 ° °Other results: ° ° °Imaging  ° ° °CT chest without contrast ° °Result Date: 05/01/2019 °CLINICAL DATA:  Aortic disease EXAM: CT CHEST WITHOUT CONTRAST TECHNIQUE: Multidetector CT imaging of the chest was performed following the standard protocol without IV contrast. COMPARISON:  Chest radiograph dated  03/31/2019 and CT chest dated 06/02/2018 FINDINGS: Cardiovascular: Normal heart size. No pericardial effusion. A right internal jugular swans Ganz catheter terminates in the right pulmonary artery. A right upper extremity peripherally inserted central venous catheter terminates at the superior cavoatrial junction. An intra-aortic balloon pump is seen with the radiopaque marker located in the thoracic aorta distal to the left subclavian artery origin. Vascular calcifications are seen in the coronary arteries and aortic arch. Mediastinum/Nodes: No enlarged mediastinal or axillary lymph nodes. Thyroid gland, trachea, and esophagus demonstrate no significant findings. Lungs/Pleura: Large bilateral pleural effusions with associated atelectasis are noted. Moderate to severe bilateral ground-glass opacities are seen. There is no pneumothorax. Upper Abdomen: No acute abnormality. Musculoskeletal: No chest wall mass or suspicious bone lesions identified. Degenerative changes are seen in the spine. IMPRESSION: 1. Large bilateral pleural effusions with associated atelectasis. 2. Moderate to severe bilateral ground-glass opacities, likely reflecting pneumonia and/or edema. Aortic Atherosclerosis (ICD10-I70.0). Electronically Signed   By: Tyler  Litton M.D.   On: 04/28/2019 14:05  ° °DG Chest Port 1 View ° °Result Date: 05/09/2019 °CLINICAL DATA:  Intubation.  Cardiac arrest. EXAM: PORTABLE CHEST 1 VIEW COMPARISON:  05/07/2019. FINDINGS: Endotracheal tube, NG tube, Swan-Ganz catheter, right PICC line, intra-aortic balloon pump in stable position. Heart size stable. Diffuse severe bilateral pulmonary infiltrates/edema again noted. Bilateral pleural effusions again noted. No pneumothorax. IMPRESSION: 1. Lines and tubes including intra-aortic balloon pump in unchanged position. 2. Diffuse severe bilateral pulmonary infiltrates/edema again noted. Bilateral pleural effusions again noted. Similar findings noted on prior exam.  Electronically Signed   By: Thomas  Register   On: 04/19/2019 06:53  ° °DG Chest Port 1 View ° °Result Date: 04/30/2019 °CLINICAL DATA:  Endotracheal tube placement EXAM: PORTABLE CHEST 1 VIEW COMPARISON:  April 17, 2019 FINDINGS: The intra-aortic balloon pump tip has advanced superiorly and now terminates over the superior aspect of the aortic arch. The tip is pointed towards the patient's left axilla. The Swan-Ganz catheter tip projects over the main right pulmonary artery. The endotracheal tube terminates above the carina by approximately 4.6 cm. The enteric tube extends below the left hemidiaphragm. Cardiomegaly is again noted. There are findings of pulmonary edema. There are bilateral pleural effusions. The right-sided PICC line tip is difficult to fully appreciate but appears to reside near the cavoatrial junction. There is no pneumothorax. IMPRESSION: 1. Lines and tubes as above. The intra-aortic balloon pump tip   pump tip appears to have moved cranially since the prior study. However, this appearance may be secondary to patient positioning. 2. No pneumothorax. 3. Persistent hazy bilateral airspace opacities with cardiomegaly and bilateral pleural effusions suggestive of congestive heart failure. Electronically Signed   By: Constance Holster M.D.   On: 05/05/2019 22:15   DG CHEST PORT 1 VIEW  Result Date: 04/20/2019 CLINICAL DATA:  Congestive heart failure/pulmonary edema EXAM: PORTABLE CHEST 1 VIEW COMPARISON:  CT chest dated 04/19/2019 FINDINGS: Cardiomegaly with moderate interstitial edema. Moderate layering bilateral pleural effusions. No pneumothorax. These findings are better visualized on recent CT. Right IJ Swan-Ganz catheter terminating in the right main pulmonary artery, unchanged. IABP at the aortic knob, unchanged. Defibrillator pads overlying the left hemithorax. IMPRESSION: Cardiomegaly with mild interstitial edema and moderate layering bilateral pleural effusions, unchanged from recent CT. Right IJ  venous catheter and IABP, as described above, unchanged. Electronically Signed   By: Julian Hy M.D.   On: 05/07/2019 19:28     Medications:     Scheduled Medications:  [MAR Hold] aspirin EC  81 mg Oral Daily   [MAR Hold] chlorhexidine gluconate (MEDLINE KIT)  15 mL Mouth Rinse BID   [MAR Hold] Chlorhexidine Gluconate Cloth  6 each Topical Daily   [MAR Hold] digoxin  0.125 mg Oral Daily   epinephrine  0-10 mcg/min Intravenous To OR   [MAR Hold] feeding supplement (ENSURE ENLIVE)  237 mL Oral TID BM   [MAR Hold] fentaNYL (SUBLIMAZE) injection  100 mcg Intravenous Once   [MAR Hold] gabapentin  100 mg Per Tube Q8H   heparin-papaverine-plasmalyte irrigation   Irrigation To OR   [MAR Hold] insulin aspart  0-15 Units Subcutaneous Q4H   [MAR Hold] insulin glargine  18 Units Subcutaneous BID   magnesium sulfate  40 mEq Other To OR   [MAR Hold] mouth rinse  15 mL Mouth Rinse 10 times per day   midazolam       [MAR Hold] multivitamin with minerals  1 tablet Per Tube Daily   [MAR Hold] oxyCODONE  10 mg Per Tube Q6H   [MAR Hold] pantoprazole (PROTONIX) IV  40 mg Intravenous Q24H   phenylephrine  30-200 mcg/min Intravenous To OR   pneumococcal 23 valent vaccine  0.5 mL Intramuscular Tomorrow-1000   potassium chloride  80 mEq Other To OR   [MAR Hold] rosuvastatin  40 mg Oral q1800   [MAR Hold] sodium chloride flush  10-40 mL Intracatheter Q12H   [MAR Hold] sodium chloride flush  3 mL Intravenous Q12H   [MAR Hold] sodium chloride flush  3 mL Intravenous Q12H   [MAR Hold] sodium chloride flush  3 mL Intravenous Q12H   [MAR Hold] spironolactone  12.5 mg Oral Daily   tranexamic acid  15 mg/kg Intravenous To OR   tranexamic acid  2 mg/kg Intracatheter To OR    Infusions:  [MAR Hold] sodium chloride     sodium chloride     [MAR Hold] sodium chloride     amiodarone 60 mg/hr (04/20/2019 0700)   cefUROXime (ZINACEF)  IV     dexmedetomidine     fentaNYL  infusion INTRAVENOUS 50 mcg/hr (05/09/2019 0700)   furosemide (LASIX) infusion Stopped (04/20/2019 0750)   heparin 30,000 units/NS 1000 mL solution for CELLSAVER     impella catheter heparin 50 unit/mL in dextrose 5%     heparin Stopped (05/03/2019 0730)   lidocaine Stopped (05/12/2019 0750)   milrinone     milrinone 0.25 mcg/kg/min (05/03/2019 0729)  nitroGLYCERIN    °• [MAR Hold] norepinephrine (LEVOPHED) Adult infusion 7 mcg/min (04/30/2019 0855)  °• norepinephrine    °• tranexamic acid (CYKLOKAPRON) infusion ([facility])    °• vancomycin    ° ° °PRN Medications: °[MAR Hold] sodium chloride, sodium chloride, [MAR Hold] sodium chloride, 0.9 % irrigation (POUR BTL), [MAR Hold] acetaminophen, [MAR Hold] bisacodyl, [MAR Hold] docusate, [MAR Hold] fentaNYL, [MAR Hold] fentaNYL (SUBLIMAZE) injection, [MAR Hold] fentaNYL (SUBLIMAZE) injection, hemostatic agents, heparin irrigation 6000 unit, [MAR Hold] hydrALAZINE, [MAR Hold] midazolam, [MAR Hold] midazolam, [MAR Hold] nitroGLYCERIN, [MAR Hold] ondansetron (ZOFRAN) IV, [MAR Hold] ondansetron (ZOFRAN) IV, [MAR Hold] sodium chloride flush, [MAR Hold] sodium chloride flush, sodium chloride flush, [MAR Hold] sodium chloride flush, [MAR Hold] traMADol ° ° ° ° °Assessment/Plan  ° ° °1.Acute systolic HF ->  Cardiogenic Shock  °- Worsening shock overnight despite IABP placement on 4/1 and inotrope support.  °- Intubated and inotropes adjusted. °- Now more stable co-ox 70%. Beginning to diurese °- D/w Dr. Van Trigt at length. Plan Impella 5.5 placement tonight with bilateral CTs prior to high risk CABG/MVR next week  ° ° °2. CAD °-LHC with severe 3 vessel CAD  °- Plan as above.  °  °3. Acute hypoxic respiratory failure °- intubated 4/1 °- CCM managing vent °- plan for bilateral CTs today for large pleural effusions ° °4. Mitral Regurgitation °- Mod-severe on ECHO °- Hopefully will improve with diuresis.  °- If not will need MVR at time of CABG °  °5. Uncontrolled DM °-Hgb A1C 9.    °- On insulin and sliding scale.  °  °6. Acute blood loss Anemia  °-transfuse as need to keep Hgb > 7.5 ° °7. NSVT °-Continue amio drip. + lidocaine drip 1 mg per hour.  °- Ectopy decreased.  °-Keep K >4  °-Keep  Mag >2  ° °8. Neuropathy °-Very limited with severe neuropathy. No sensation R and L foot and occasionaly in his hands. Requires assistance with ADLs.  ° ° °9. Severe Malnutrition °-Prealbumin 8.9  °-Nutrition on board °- will need TFs ° °CRITICAL CARE °Performed by: ,  ° °Total critical care time: 40 minutes ° °Critical care time was exclusive of separately billable procedures and treating other patients. ° °Critical care was necessary to treat or prevent imminent or life-threatening deterioration. ° °Critical care was time spent personally by me (independent of midlevel providers or residents) on the following activities: development of treatment plan with patient and/or surrogate as well as nursing, discussions with consultants, evaluation of patient's response to treatment, examination of patient, obtaining history from patient or surrogate, ordering and performing treatments and interventions, ordering and review of laboratory studies, ordering and review of radiographic studies, pulse oximetry and re-evaluation of patient's condition. ° ° ° °Length of Stay: 4 ° ° , MD  °05/06/2019, 9:55 AM ° °Advanced Heart Failure Team °Pager [phone] (M-F; 7a - 4p)  °Please contact [facility] for night-coverage after hours (4p -7a ) and weekends on [email or web address] ° ° ° °

## 2019-04-18 NOTE — Progress Notes (Signed)
ANTICOAGULATION CONSULT NOTE  Pharmacy Consult for Heparin Indication: chest pain/ACS  Heparin Dosing Weight: 68.2 kg  Labs: Recent Labs    05/06/2019 0311 04/27/2019 0853 05/08/2019 1725 04/29/2019 1830 05/12/2019 2127 04/29/2019 0348 05/12/2019 0348 05/05/2019 0820 05/10/2019 0825 04/26/2019 0912 04/21/2019 0912 04/29/2019 0916 05/10/2019 1018  HGB 9.9*   < >  --  9.7*   < > 9.9*   < > 9.9*   < > 8.8*   < > 9.5* 9.2*  HCT 30.1*   < >  --  29.3*   < > 30.4*   < > 29.0*   < > 26.0*  --  28.0* 27.0*  PLT 282  --   --  261  --  256  --   --   --   --   --   --   --   HEPARINUNFRC 0.61  --  0.52  --   --  0.46  --   --   --   --   --   --   --   CREATININE 1.13  --   --  1.56*   < > 1.52*   < > 1.80*  --  1.60*  --   --  1.70*   < > = values in this interval not displayed.    Assessment: 64 yo male presenting to [facility] with malaise and SOB for 4 days found to have elevated troponin and new depressed EF. Transferred to [facility] for further evaluation. Patient was started on heparin IV on 3/26, no anticoagulation PTA.   Patient now s/p cath 3/30 showing severe 3-vessel obstructive CAD, high LV filling pressures, reduced cardiac output, and moderate pulmonary HTN. After cath patient was transfered to the ICU and place PICC for IV milrinone, considering CABG.   Received impella 5.5 support on 4/2 - plan for CABG next week. Currently on standard heparin purge solution - purge flow is 8.6 mL/hr (430 units/hr). Will continue purge solution tonight and reassess for systemic heparin pending 6 hr HL.  Goal of Therapy:  Heparin level 0.2-0.5 units/ml - for first 12 hours will target 0.2-0.25 Monitor platelets by anticoagulation protocol: Yes   Plan:  Hold on systemic heparin.  Continue purge heparin infusion. Will get heparin level in 6 hours and assess for need of systemic heparin.  Antonietta Jewel, PharmD, [place] Clinical Pharmacist  Phone: [phone]  Please check AMION for all [place] phone  numbers After 10:00 PM, call [place] [phone]

## 2019-04-18 NOTE — Progress Notes (Signed)
Resting, lightly sedated on Fentanyl gtt @ 50 mcg/hr, opens eyes and follows simple commands, nods yes/no, calm.   Sats stable on vent, thick tan secretions. Diuresed well with > 3 L/24 hr. CO stable @ 5 L/min. CVP/PA pressures trended down overnight. Coox improving.   IABP remains on 1:1 with good augmentation, no complications. BP stable on levo gtt @ 5.   Amio, Lidocaine and milrinone gtts continue, SR with occasional 3-4 runs of ectopy.   0730 Dr Prescott Gum here, updated on events overnight and pt condition, holding off on CABG today,, plan for Impella placement and bilat chest tubes. Called pt's daughter to provide update on events and plan, consent obtained by Dr Prescott Gum.

## 2019-04-18 NOTE — Progress Notes (Addendum)
Pre Procedure note for inpatients:   Brendan [name] has been scheduled for Procedure(s): Placement of Impella 5.5 Direct (N/A) TRANSESOPHAGEAL ECHOCARDIOGRAM (TEE) (N/A) today. The various methods of treatment have been discussed with the patient. After consideration of the risks, benefits and treatment options the patient has consented to the planned procedure.   The patient has been seen and labs reviewed. There are no changes in the patient's condition to prevent proceeding with the planned procedure today.  Recent labs:  Lab Results  Component Value Date   WBC 17.5 (H) 04/20/2019   HGB 9.9 (L) 04/24/2019   HCT 30.4 (L) 04/21/2019   PLT 256 04/27/2019   GLUCOSE 80 04/30/2019   CHOL 174 08/21/2017   TRIG 143 08/21/2017   HDL 57 08/21/2017   LDLCALC 88 08/21/2017   ALT 18 04/16/2019   AST 17 04/16/2019   NA 132 (L) 04/18/2019   K 4.7 05/11/2019   CL 95 (L) 05/09/2019   CREATININE 1.52 (H) 05/07/2019   BUN 45 (H) 05/12/2019   CO2 25 05/04/2019   TSH 0.925 03/18/2019   INR 1.3 (H) 03/19/2019   HGBA1C 9.0 (H) 04/08/2019   MICROALBUR 30 08/21/2017    [name], MD 04/28/2019 7:30 AM  Overnight patient became hypotensive with respiratory distress and was intubated and placed on norepinephrine.  Because he is cardiogenic shock with multiorgan failure despite balloon pump support his cardiologist and myself feel that placing an Impella 5.5 catheter to stabilize the patient, resolving cardiogenic shock and treating multiorgan dysfunction prior to high risk CABG and mitral valve replacement is the best strategy.  Patient's procedure today will be to place an Impella 5.5 catheter, drain both pleural effusions intermittently.  I discussed the strategy change in plan with patient's family and they understand and agree.

## 2019-04-18 NOTE — Plan of Care (Signed)
Pt abruptly awake and agitated, pulling at ETT with mitted hands. RN x 2 at bedside, reoriented, reassured pt, bolus Fentanyl 50 mcg from gtt per prn orders. Order for bilat soft limb wrist restraints obtained and implemented for pt safety. Following restraint bundle.   Pt responded well, resting, RN remains at bedside.   Problem: Safety: Goal: Ability to remain free from injury will improve Outcome: Progressing

## 2019-04-18 NOTE — Anesthesia Preprocedure Evaluation (Signed)
Anesthesia Evaluation  Patient identified by MRN, date of birth, ID band  Reviewed: Patient's Chart, lab work & pertinent test results, Unable to perform ROS - Chart review only  Airway Mallampati: Intubated       Dental   Pulmonary     + decreased breath sounds      Cardiovascular hypertension,  Rhythm:Regular Rate:Normal     Neuro/Psych    GI/Hepatic   Endo/Other  diabetes  Renal/GU      Musculoskeletal   Abdominal   Peds  Hematology   Anesthesia Other Findings   Reproductive/Obstetrics                             Anesthesia Physical Anesthesia Plan  ASA: IV  Anesthesia Plan: General   Post-op Pain Management:    Induction: Intravenous  PONV Risk Score and Plan:   Airway Management Planned: Oral ETT  Additional Equipment: 3D TEE  Intra-op Plan:   Post-operative Plan: Post-operative intubation/ventilation  Informed Consent: I have reviewed the patients History and Physical, chart, labs and discussed the procedure including the risks, benefits and alternatives for the proposed anesthesia with the patient or authorized representative who has indicated his/her understanding and acceptance.       Plan Discussed with: CRNA and Anesthesiologist  Anesthesia Plan Comments:         Anesthesia Quick Evaluation

## 2019-04-18 NOTE — Brief Op Note (Signed)
05/05/2019  11:43 AM  PATIENT:  Brendan [name]  64 y.o. male  PRE-OPERATIVE DIAGNOSIS:  3 vessel CAD severe Mitral Regurgation cardiogenic shock  POST-OPERATIVE DIAGNOSIS:  3 vessel CAD severe Mitral Regurgation Cardiogenic shock PROCEDURE:  Procedure(s): Placement of Impella 5.5 Direct (Right) TRANSESOPHAGEAL ECHOCARDIOGRAM (TEE) (N/A)  Bilateral chest tube place ment for 3 L pleural effusions   SURGEON:  Surgeon(s) and Role:    Ivin Poot, MD - Primary  PHYSICIAN ASSISTANT:   ASSISTANTS: TOW RNFA   ANESTHESIA:   gen  EBL:  250 mL   BLOOD ADMINISTERED:1 unit CC PRBC  DRAINS: bilat 26F chest tubes   LOCAL MEDICATIONS USED:  NONE  SPECIMEN:  Aspirate  DISPOSITION OF SPECIMEN:  culture pleural fluid  COUNTS:  YES  TOURNIQUET:  * No tourniquets in log *  DICTATION: .Dragon Dictation  PLAN OF CARE: return to 2 H  PATIENT DISPOSITION:  ICU - intubated and hemodynamically stable.   Delay start of Pharmacological VTE agent (>24hrs) due to surgical blood loss or risk of bleeding: yes

## 2019-04-18 NOTE — Progress Notes (Signed)
Nutrition Follow-up  DOCUMENTATION CODES:   Severe malnutrition in context of chronic illness  INTERVENTION:   Tube Feeding:  Vital 1.5 at 45 ml/hr Pro-Stat 30 mL BID Provides 1820 kcals 103 g of protein and 821 mL of free water Meets 100% estimated calorie and protein needs  Continue MVI with [place]   NUTRITION DIAGNOSIS:   Severe Malnutrition related to chronic illness as evidenced by severe muscle depletion, severe fat depletion.  Being addressed via TF   GOAL:   Patient will meet greater than or equal to 90% of their needs  Progressing  MONITOR:   Vent status, Diet advancement, Labs, Weight trends, TF tolerance  REASON FOR ASSESSMENT:   Consult Poor PO, Assessment of nutrition requirement/status  ASSESSMENT:   64 yo male admitted with acute systeolic CHF, CAD with 3 vessel disease with plan for CABG. PMH includes DM with HgbA1c 9 with neuropathy, HTN  3/30 Cardiac Cath with severe 3-vessel CAD 3/31 ECHO: EF 30-35% 4/01 IABP placed, Intubated 4/02 Impella placed, bilateral CT placed for pleural effusions  CABG currently on hold Patient is currently intubated on ventilator support MV: 11.8 L/min Temp (24hrs), Avg:98.3 F (36.8 C), Min:96.3 F (35.7 C), Max:99.5 F (37.5 C)  Current weight 69.4 kg; lowest weight of 65.6 kg this admission. Plan to utilize 65 kg as EDW at present  OG tube in place, plan to initiate TF today  Labs: sodium 131 (L) Meds: lasix drip, ss novolog, lantus, MVI with Minerals  Diet Order:   Diet Order            Diet NPO time specified  Diet effective now              EDUCATION NEEDS:   Education needs have been addressed  Skin:  Skin Assessment: Reviewed RN Assessment  Last BM:  3/29  Height:   Ht Readings from Last 1 Encounters:  04/24/2019 5\' 11"  (1.803 m)    Weight:   Wt Readings from Last 1 Encounters:  04/26/2019 69.4 kg    BMI:  Body mass index is 21.34 kg/m.  Estimated Nutritional Needs:    Kcal:  1838 kcals  Protein:  100-115 g  Fluid:  >/= 1.5 L  Kerman Passey MS, RDN, LDN, CNSC RD Pager Number and Weekend/On-Call After Hours Pager Located in [place]

## 2019-04-18 NOTE — Progress Notes (Signed)
ANTICOAGULATION CONSULT NOTE  Pharmacy Consult for Heparin Indication: chest pain/ACS  Heparin Dosing Weight: 68.2 kg  Labs: Recent Labs    05/03/2019 0311 04/19/2019 0853 05/01/2019 1725 05/05/2019 1830 05/03/2019 2127 04/20/2019 0348 04/24/2019 0348 05/06/2019 0820 05/11/2019 0825 04/26/2019 0912 04/27/2019 0912 04/28/2019 0916 05/08/2019 1018 05/05/2019 1410  HGB 9.9*   < >  --  9.7*   < > 9.9*   < > 9.9*   < > 8.8*   < > 9.5* 9.2*  --   HCT 30.1*   < >  --  29.3*   < > 30.4*   < > 29.0*   < > 26.0*  --  28.0* 27.0*  --   PLT 282  --   --  261  --  256  --   --   --   --   --   --   --   --   HEPARINUNFRC 0.61  --  0.52  --   --  0.46  --   --   --   --   --   --   --  <0.10*  CREATININE 1.13  --   --  1.56*   < > 1.52*   < > 1.80*  --  1.60*  --   --  1.70*  --    < > = values in this interval not displayed.    Assessment: 64 yo male presenting to [facility] with malaise and SOB for 4 days found to have elevated troponin and new depressed EF. Transferred to [facility] for further evaluation. Patient was started on heparin IV on 3/26, no anticoagulation PTA.   Patient now s/p cath 3/30 showing severe 3-vessel obstructive CAD, high LV filling pressures, reduced cardiac output, and moderate pulmonary HTN. After cath patient was transfered to the ICU and place PICC for IV milrinone, considering CABG.   Received impella 5.5 support on 4/2 - plan for CABG next week. Currently on standard heparin purge solution - purge flow is 9.1 mL/hr (455 units/hr). Initial HL is undetectable.   Goal of Therapy:  Heparin level 0.2-0.5 units/ml - for first 12 hours will target 0.2-0.25 Monitor platelets by anticoagulation protocol: Yes   Plan:  Start IV heparin at 150 units/hr  Continue purge heparin infusion. Will get heparin level in 6 hours and assess for need of systemic heparin.  Albertina Parr, PharmD., BCPS Clinical Pharmacist Clinical phone for 05/09/2019 until 5pm: [phone] If after 5pm, please refer  to [facility] for unit-specific pharmacist

## 2019-04-18 NOTE — Consult Note (Signed)
NAME:  Brendan [name], MRN:  [id number], DOB:  [birth year], LOS: 4 ADMISSION DATE:  03/25/2019, CONSULTATION DATE:  05/03/2019 REFERRING MD:  Dr. Haroldine Laws, CHIEF COMPLAINT:  Respiratory distress  Brief History   35 yoM originally presented with SOB and fatigue at [facility] found to have new HFrEF,  NSTEMI, and bilateral pleural effusions transferred to [facility] on 3/29 for further cardiac evaluation.  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  Started on lasix and milrinone gtts however developed NSVT.  Was taken 4/1 for placement of IABP and swan for optimization prior to CABG.  Was on precedex for agitation/ confusion, some concern for DTs.  On the evening of 4/1, patient developed worsening respiratory distress and hypoxia, PCCM consulted for intubation and vent management.   History of present illness   HPI obtained from medical chart review as patient is s/p emergent intubation and unable to provide history.    64 year old male with prior history of tobacco abuse, HTN, poorly controlled diabetes, and diabetic neuropathy who presented to [facility] on 3/26 with complaints of 4 days history of general malaise and shortness of breath.  He was found to have elevated troponin's, inferior wall EKG changes, and new systolic heart failure with EF 25%.  There was some concern for possible pneumonia vs pulmonary edema and was empirically on ceftriaxone and doxycycline, of which were since stopped given negative procalcitonin and no leukocytosis.  He was transferred to [facility] on 3/29 for further cardiac evaluation and consideration of cardiac catheterization.   At [facility], underwent left and right heart cath which showed severe 3 vessel obstructive CAD.  Repeat echo showed EF 30-35%, normal RV, and moderate to severe MR with CVP of 25 and LVEDP of 35 with CI 1.9- 2.0.  Heart failure team consulted and he was started on lasix gtt and milrinone gtts.  His shortness of breath did improve with diuresis.  TCTS were consulted for  CABG.  However, patient developed multiple PVCs and short runs of NSVT.  He was placed on amiodarone and milrinone held however coox worsened.  Milrinone was restarted and required the addition of lidocaine gtt for ongoing NSVT.   On 4/1, he was taken back for RHC and placement of IABP/ PA catheter to help optimize patient prior to CABG.  Throughout the day 4/1, he became increasing agitated despite precedex gtt.  Some concern for possible DTs.  He has intermittently required norepi for hemodynamic support and remained on 1:1 IABP support.  On the evening of 4/1, patient developed worsening hypoxia despite NRB and increased work of breathing this evening.  PCCM consulted for emergent intubation and mechanical ventilation management.   Past Medical History  Tobacco abuse, HTN, poorly controlled diabetes, diabetic neuropathy   Significant Hospital Events   3/26 presented to [facility] 3/29 tx to [facility]  3/30 [facility] 4/1  RHC/ IABP/ PA cath 4/2 Impella placement; bilateral CT placement  Consults:  TCTS PCCM  Procedures:  3/30 R/ LHC  4/1 RHC  4/1 IABP R groin >>4/2 4/1 R IJ sheath/ PA cath >> 4/1 Left radial art line >> 4/1 ETT >> 4/2 Impella 4/2 Chest tube right>> 4/2 Chest tube left>>  Significant Diagnostic Tests:   3/30 R/ LHC >>  Ost LM to Mid LM lesion is 35% stenosed.  Prox LAD lesion is 90% stenosed.  Prox LAD to Mid LAD lesion is 50% stenosed.  Mid Cx lesion is 100% stenosed.  Prox RCA to Mid RCA lesion is  90% stenosed.  RPDA lesion is 95% stenosed.  LV end diastolic pressure is severely elevated.  Hemodynamic findings consistent with moderate pulmonary hypertension. 1. Severe 3 vessel obstructive CAD 2. High LV filling pressures 3. Reduced cardiac output with index 2.3 4. Moderate pulmonary HTN.  3/30 TTE >> 1. Left ventricular ejection fraction, by estimation, is 30 to 35%. The left ventricle has moderately decreased function. The left ventricle  demonstrates regional wall motion abnormalities.There is mild left ventricular hypertrophy. Left ventricular diastolic function could not be evaluated. There is severe hypokinesis of the left ventricular, entire inferior wall, inferoseptal wall, apical segment and lateral wall.  2. Right ventricular systolic function is hyperdynamic. The right ventricular size is normal. There is moderately elevated pulmonary artery systolic pressure.  3. Decreased posterior leaflet motion due to ischemic tethering of the mitral valve leaflets.  4. The mitral valve is abnormal. Moderate to severe mitral valve regurgitation.  5. The aortic valve is tricuspid. Aortic valve regurgitation is not visualized. Mild aortic valve sclerosis is present, with no evidence of aortic valve stenosis.  6. The inferior vena cava is normal in size with greater than 50% respiratory variability, suggesting right atrial pressure of 3 mmHg.   4/1 CT chest w/o >> Large bilateral pleural effusions with associated atelectasis. Moderate to severe bilateral ground-glass opacities, likely reflecting pneumonia and/or edema.  4/1 RHC  >> RA = 18 RV = 60/20  PA = 63/29 (42) PCW = 33 Fick cardiac output/index = 3.7/1.9 PVR = 2.5 WU FA sat = 98% PA sat = 47%, 49% PaPi = 2.2  4/2 CXR>bilateral chest tubes present. Significantly improved since morning xray. Infiltrate vs atelectasis at right base.  Micro Data:  3/30 MRSA PCR >> neg 4/1 MRSA PCR >> neg  Antimicrobials:  PTA ceftriaxone/ doxycycline >> 3/29  Interim history/subjective:  Returned from impella placement Objective   Blood pressure (!) 145/80, pulse 76, temperature 99.1 F (37.3 C), resp. rate 19, height 5\' 11"  (1.803 m), weight 69.4 kg, SpO2 100 %. PAP: (46-70)/(13-37) 50/20 CVP:  [8 mmHg-21 mmHg] 11 mmHg PCWP:  [22 mmHg] 22 mmHg CO:  [4.5 L/min-5.9 L/min] 5.3 L/min CI:  [2.4 L/min/m2-3.2 L/min/m2] 2.8 L/min/m2  Vent Mode: PCV FiO2 (%):  [50 %-100 %] 60 % Set  Rate:  [12 bmp] 12 bmp PEEP:  [5 cmH20] 5 cmH20 Plateau Pressure:  [17 cmH20-19 cmH20] 19 cmH20   Intake/Output Summary (Last 24 hours) at 05/15/2019 1227 Last data filed at 05/16/2019 1200 Gross per 24 hour  Intake 2947.74 ml  Output 6240 ml  Net -3292.26 ml   Filed Weights   04/16/19 0600 05/03/2019 0452 04/21/2019 0630  Weight: 65.6 kg 68.4 kg 69.4 kg   Examination: General:  Sedated, intubated HEENT: ETT Neuro: sedated CV: RRR. Mechanical sounds. Bilateral chest tubes with minimal output. PULM:  Bibasilar rhonchi GI: soft, bs+, foley Extremities: warm/dry, no LE edema  Skin: no rashes   Resolved Hospital Problem list    Assessment & Plan:   Acute respiratory insufficiency  Large bilateral pleural effusions -likely transudate 2/2 HFrEF and malnutrition. Chest tubes placed 4/2 for large effusions--2L off. Hopeful to improve with diuresis. Full MV support, PCV VAP bundling  HFrEF. Cardiogenic shock. IABP placed 4/1--removed 4/2. impella placed 4/2. CVP/PA pressures down since impella placement. Continue lasix, milrinone, levo NSTEMI/ CAD with severe 3 vessel disease. CABG pending hemodynamic stability. Likely Monday or tuesday Severe mitral regurgitation. Hopeful to improve with diuresis however may end up needing MVR at time  of CABG. NSVT. On lido and amio drip Management Per HF and TCTS  Acute kidney injury. Cardiogenic shock vs diuresis vs contrast mediated. Will continue to monitor.  Acute encephalopathy of critical illness vs etoh withdrawal. Continue fentanyl for sedation. Will add on precedex as pt appears very awake on afternoon rounds  Poorly controlled diabetes. Continue lantus and SSI  Post op blood loss anemia. Transfuse for hgb <8  Protein calorie malnutrition. Will place nutrition consult to start tube feeds.  Best practice:  Diet: NPO Pain/Anxiety/Delirium protocol (if indicated): fentanyl gtt/ versed prn, RASS goal -1 VAP protocol (if indicated): yes DVT  prophylaxis: heparin gtt GI prophylaxis: PPI Glucose control: changed to q 4 SSI with lantus Mobility: BR Code Status: Full  Family Communication: per primary  Disposition: ICU   Labs   CBC: Recent Labs  Lab 03/21/2019 0113 03/27/2019 1501 04/16/19 0448 04/16/19 0448 04/30/2019 0311 05/01/2019 0853 04/21/2019 1830 05/13/2019 2127 04/24/2019 0348  WBC 8.1  --  9.0  --  9.4  --  11.2*  --  17.5*  HGB 9.8*   < > 9.8*   < > 9.9* 10.2*  9.2* 9.7* 10.2* 9.9*  HCT 29.7*   < > 30.2*   < > 30.1* 30.0*  27.0* 29.3* 30.0* 30.4*  MCV 86.8  --  87.5  --  87.0  --  86.9  --  86.4  PLT 261  --  319  --  282  --  261  --  256   < > = values in this interval not displayed.    Basic Metabolic Panel: Recent Labs  Lab 04/06/2019 0113 04/11/2019 1501 04/02/2019 2127 04/01/2019 2213 04/16/19 0448 04/16/19 0448 04/16/19 1225 04/16/19 1225 05/13/2019 0311 04/21/2019 0853 05/08/2019 1830 04/20/2019 2127 05/04/2019 0348  NA 134*   < > 137   < > 134*   < > 134*   < > 131* 132*  136 133* 132* 132*  K 4.4   < > 3.9   < > 3.6   < > 4.1   < > 4.8 5.3*  4.8 4.9 4.9 4.7  CL 103  --  106   < > 101  --  97*  --  94*  --  98  --  95*  CO2 21*  --  23   < > 23  --  26  --  23  --  24  --  25  GLUCOSE 103*  --  191*   < > 215*  --  375*  --  412*  --  158*  --  80  BUN 51*  --  36*   < > 34*  --  36*  --  38*  --  45*  --  45*  CREATININE 1.26*  --  0.92   < > 0.99  --  1.17  --  1.13  --  1.56*  --  1.52*  CALCIUM 8.0*  --  7.9*   < > 8.2*  --  8.7*  --  8.5*  --  9.0  --  9.1  MG 2.6*  --  2.3  --  2.6*  --   --   --  2.2  --   --   --  2.1   < > = values in this interval not displayed.   GFR: Estimated Creatinine Clearance: 50.1 mL/min (A) (by C-G formula based on SCr of 1.52 mg/dL (H)). Recent Labs  Lab 04/16/19 0448 05/08/2019 7169  05/13/2019 1830 05/09/2019 0035 05/10/2019 0348  WBC 9.0 9.4 11.2*  --  17.5*  LATICACIDVEN  --   --  2.2* 1.2  --     Liver Function Tests: Recent Labs  Lab 04/11/2019 2149  04/16/19 0448  AST 24 17  ALT 22 18  ALKPHOS 89 83  BILITOT 0.8 0.6  PROT 5.8* 5.3*  ALBUMIN 2.7* 2.5*   No results for input(s): LIPASE, AMYLASE in the last 168 hours. No results for input(s): AMMONIA in the last 168 hours.  ABG    Component Value Date/Time   PHART 7.462 (H) 04/28/2019 2127   PCO2ART 33.9 05/10/2019 2127   PO2ART 119.0 (H) 04/19/2019 2127   HCO3 24.3 05/15/2019 2127   TCO2 25 04/24/2019 2127   ACIDBASEDEF 3.0 (H) 04/21/2019 0853   ACIDBASEDEF 2.0 04/29/2019 0853   O2SAT 69.5 04/24/2019 0450     Coagulation Profile: Recent Labs  Lab 03/21/2019 2149  INR 1.3*    Cardiac Enzymes: No results for input(s): CKTOTAL, CKMB, CKMBINDEX, TROPONINI in the last 168 hours.  HbA1C: HbA1c, POC (controlled diabetic range)  Date/Time Value Ref Range Status  01/14/2019 02:02 PM 12.5 (A) 0.0 - 7.0 % Final  08/21/2017 02:03 PM 8.5 (A) 0.0 - 7.0 % Final   Hgb A1c MFr Bld  Date/Time Value Ref Range Status  03/24/2019 09:49 PM 9.0 (H) 4.8 - 5.6 % Final    Comment:    (NOTE) Pre diabetes:          5.7%-6.4% Diabetes:              >6.4% Glycemic control for   <7.0% adults with diabetes     CBG: Recent Labs  Lab 05/14/2019 0238 04/21/2019 0350 05/05/2019 0612 05/02/2019 0632 04/21/2019 0634  GLUCAP 86 80 42* 66* 177*    Review of Systems:   unable  Past Medical History  He,  has a past medical history of Cholecystitis (02/6376), Complication of anesthesia, Diabetes mellitus without complication ([place]), Erectile dysfunction (2010), HTN (hypertension), NSTEMI (non-ST elevated myocardial infarction) ([place]) (03/19/2019), and PONV (postoperative nausea and vomiting).   Surgical History    Past Surgical History:  Procedure Laterality Date  . APPENDECTOMY  1969  . CHOLECYSTECTOMY    . CHOLECYSTECTOMY N/A 04/02/2013   Procedure: LAPAROSCOPIC CHOLECYSTECTOMY WITH  INTRAOPERATIVE CHOLANGIOGRAM;  Surgeon: Joyice Faster. Cornett, MD;  Location: [place];  Service: General;  Laterality:  N/A;  . ERCP N/A 04/02/2013   Procedure: ENDOSCOPIC RETROGRADE CHOLANGIOPANCREATOGRAPHY (ERCP);  Surgeon: Inda Castle, MD;  Location: [place];  Service: Endoscopy;  Laterality: N/A;  . 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   . IABP INSERTION N/A 04/28/2019   Procedure: IABP INSERTION;  Surgeon: Jolaine Artist, MD;  Location: [place] [facility];  Service: Cardiovascular;  Laterality: N/A;  . RIGHT HEART CATH N/A 05/07/2019   Procedure: RIGHT HEART CATH;  Surgeon: Jolaine Artist, MD;  Location: [place] [facility];  Service: Cardiovascular;  Laterality: N/A;  . RIGHT/LEFT HEART CATH AND CORONARY ANGIOGRAPHY N/A 04/12/2019   Procedure: RIGHT/LEFT HEART CATH AND CORONARY ANGIOGRAPHY;  Surgeon: Martinique, Peter M, MD;  Location: [place] [facility];  Service: Cardiovascular;  Laterality: N/A;     Social History   reports that he has never smoked. His smokeless tobacco use includes snuff and chew. He reports previous alcohol use. He reports that he does not use drugs.   Family History  His family history includes Colon cancer in his maternal aunt; Diabetes in his maternal grandmother; Stroke in his maternal grandmother.   Allergies Allergies  Allergen Reactions  . Acetaminophen Itching     Home Medications  Prior to Admission medications   Medication Sig Start Date End Date Taking? Authorizing Provider  Insulin Glargine (BASAGLAR KWIKPEN) 100 UNIT/ML Inject 28 Units into the skin at bedtime.   Yes [provider]  insulin lispro (HUMALOG) 100 UNIT/ML injection Inject 8 Units into the skin 2 (two) times daily before a meal.   Yes [provider]  metFORMIN (GLUCOPHAGE) 500 MG tablet Take 1 tablet (500 mg total) by mouth 2 (two) times daily with a meal. 06/07/17  Yes Hensel, Jamal Collin, MD  glucose blood (RELION GLUCOSE TEST STRIPS) test strip Use as instructed 02/24/19   [name], MD  Insulin Pen Needle (PEN  NEEDLES) 32G X 4 MM MISC     [provider]  ReliOn Ultra Thin Lancets MISC Use to test blood sugar up to 4 times daily. 07/28/13   Coral Spikes, DO     CRITICAL CARE Performed by: Kennieth Rad   Total critical care time: 55 minutes   Critical care time was exclusive of separately billable procedures and treating other patients.   Critical care was necessary to treat or prevent imminent or life-threatening deterioration.   Critical care was time spent personally by me on the following activities: development of treatment plan with patient and/or surrogate as well as nursing, discussions with consultants, evaluation of patient's response to treatment, examination of patient, obtaining history from patient or surrogate, ordering and performing treatments and interventions, ordering and review of laboratory studies, ordering and review of radiographic studies, pulse oximetry and re-evaluation of patient's condition.  Kennieth Rad, MSN, AGACNP-BC [facility] 04/24/2019, 12:27 PM

## 2019-04-18 NOTE — Progress Notes (Signed)
Echocardiogram Echocardiogram Transesophageal has been performed.  Brendan [name] 05/13/2019, 8:50 AM

## 2019-04-18 NOTE — Progress Notes (Signed)
Verbal orders from Dr Haroldine Laws to discontinue Lasix gtt. CVP currently 4 and patient has diuresised 1150 since returning from the OR at 1200.

## 2019-04-18 NOTE — Op Note (Signed)
NAME: Brendan [name], Brendan [name] MEDICAL RECORD UG:[id number] ACCOUNT [id number] DATE OF BIRTH:12-Mar-1955 FACILITY: [facility] LOCATION: [facility] PHYSICIAN:Jadriel Saxer VAN TRIGT III, MD  OPERATIVE REPORT  DATE OF PROCEDURE:  05/15/2019  OPERATION: 1.  Placement of Impella 5.5 percutaneous left ventricular assist device via 10 mm Gelweave graft to the axillary artery. 2.  Placement of bilateral 28-French chest tubes for drainage of large, 3 L bilateral pleural effusions.  SURGEON:  Ivin Poot, MD  ASSISTANT:  Dineen Kid, RNFA.  ANESTHESIA:  General by Dr. Roberts Gaudy.  PREOPERATIVE DIAGNOSES:   1.  Ischemic cardiomyopathy.  2.  Cardiogenic shock.  3.  Pulmonary edema.  4.  Severe coronary artery disease with mitral regurgitation.  5.  Pulmonary hypertension.  6.  Delirium.   7.  Poorly controlled diabetes.  POSTOPERATIVE DIAGNOSES:   1.  Ischemic cardiomyopathy.  2.  Cardiogenic shock.  3.  Pulmonary edema.  4.  Severe coronary artery disease with mitral regurgitation.  5.  Pulmonary hypertension.  6.  Delirium.   7.  Poorly controlled diabetes.  CLINICAL NOTE:  The patient is a 64 year old poorly controlled diabetic who was transferred from an outside hospital after non-ST elevation myocardial infarction, which was characterized by heart failure, low cardiac output, pulmonary congestion and  respiratory distress.  He was stabilized and underwent a cardiac catheterization demonstrating severe diabetic 3-vessel coronary disease with low ejection fraction.  Echocardiogram showed moderate to severe mitral regurgitation.  He was treated by the  heart failure service and inotropic support was started.  He did not improve significantly and a balloon pump was added.   He got worse within 12 hours with balloon placement and required intubation and additional inotropic support.  It was not felt that  he was a candidate for his corrective surgery, which would include high-risk CABG and mitral valve  repair/replacement.  I discussed the patient with his cardiologist, Dr. Haroldine Laws and I agreed with the recommendation to obtain stability in this patient  with a percutaneous left ventricular assist device which would provide adequate flow and prevent multiorgan failure and help stabilize the patient for later high-risk of CABG with mitral valve repair.  I discussed the procedure with the patient's wife  and informed consent was obtained.  OPERATIVE FINDINGS: 1.  Improved LV function after placement of percutaneous 5.5 Impella VAD. 2.  Drainage of 3 L of pleural effusion from each pleural space with bilateral chest tubes. 3.  The previously placed balloon pump was removed from the right femoral artery and pressure was held for 30 minutes.  DESCRIPTION OF PROCEDURE:  The patient was brought directly from the ICU to the operating room, intubated, sedated and on a balloon pump with inotropic support.  He was transferred to the operating table and general anesthesia was induced.  He remained  stable.  A transesophageal echo probe was placed by the anesthesia team.  The chest, abdomen and legs were prepped with Betadine and draped as a sterile field.  A proper time-out was performed.  Small incisions were made in the anterior axillary line on each thorax and a 28-French chest tube was placed bilaterally each of which drained 3 L of pleural effusion.  Next, an incision was made beneath the right clavicle to expose the axillary artery for placement of the Impella catheter.  The pectoralis major fibers were split and divided.  A deeper retractor was placed and the pectoralis minor was retracted  medially.  This exposed the neurovascular bundle.  The axillary artery was  dissected and surrounded with vessel loops.  Four thousand units of heparin was administered and vascular clamps were placed proximally and distally.  An arteriotomy was made and  a 10 mm Gelweave graft was then sewn end-to-side with running  5-0 Prolene.  The anastomosis was coated with a light layer of medical adhesive,  CoSeal.  Next, using C-arm and TEE imaging, the sheath was placed in the 10 mm graft.  The graft was flushed.  The 0.035 wire was then passed under fluoroscopic control to the aortic root, followed by the pigtail catheter.  The wire was then passed through the  aortic valve into the ventricle and this was followed by the pigtail catheter.  The wire was removed and then a second wire was placed through the pigtail in the LV and the pigtail was removed.  Next, the Impella 5.5 direct catheter was fed over the wire into the left ventricular chamber and the wire was removed.  The Impella was positioned in the appropriate length and depth into the left ventricle and appeared to have a good orientation.  The  LVAD flow was started.  The sheath was then inserted into the graft after the graft was trimmed and secured with several heavy silk ties.  The flow on the LVAD was then increased to the P6 level with improved blood pressure.  PA pressure is also improved.  The wound was irrigated with saline and topical Surgicel was applied.  The muscle layer was closed with interrupted #1 Vicryl.  Subcutaneous layer was closed with a running Vicryl and the skin was closed with staples.  The catheter was then imaged one  more time with TEE and was removed, 1.5 cm with a good position and good flow now with flow at P7.  Four L per minute of flow was obtained.  Sterile dressings were then applied over the chest tube sites and the Impella catheter site.  The balloon pump was then withdrawn from the right femoral artery using standard technique.  Direct pressure was held for 30 minutes.  The patient was then transported back to the ICU intubated and hemodynamically stable.  VN/NUANCE  D:05/15/2019 T:04/26/2019 JOB:[id number]

## 2019-04-19 ENCOUNTER — Inpatient Hospital Stay (HOSPITAL_COMMUNITY): Payer: Medicaid Other

## 2019-04-19 DIAGNOSIS — J9601 Acute respiratory failure with hypoxia: Secondary | ICD-10-CM

## 2019-04-19 DIAGNOSIS — R570 Cardiogenic shock: Secondary | ICD-10-CM

## 2019-04-19 LAB — GLUCOSE, CAPILLARY
Glucose-Capillary: 153 mg/dL — ABNORMAL HIGH (ref 70–99)
Glucose-Capillary: 160 mg/dL — ABNORMAL HIGH (ref 70–99)
Glucose-Capillary: 162 mg/dL — ABNORMAL HIGH (ref 70–99)
Glucose-Capillary: 164 mg/dL — ABNORMAL HIGH (ref 70–99)
Glucose-Capillary: 176 mg/dL — ABNORMAL HIGH (ref 70–99)
Glucose-Capillary: 178 mg/dL — ABNORMAL HIGH (ref 70–99)
Glucose-Capillary: 183 mg/dL — ABNORMAL HIGH (ref 70–99)
Glucose-Capillary: 191 mg/dL — ABNORMAL HIGH (ref 70–99)
Glucose-Capillary: 193 mg/dL — ABNORMAL HIGH (ref 70–99)
Glucose-Capillary: 194 mg/dL — ABNORMAL HIGH (ref 70–99)
Glucose-Capillary: 202 mg/dL — ABNORMAL HIGH (ref 70–99)
Glucose-Capillary: 205 mg/dL — ABNORMAL HIGH (ref 70–99)
Glucose-Capillary: 215 mg/dL — ABNORMAL HIGH (ref 70–99)
Glucose-Capillary: 223 mg/dL — ABNORMAL HIGH (ref 70–99)
Glucose-Capillary: 225 mg/dL — ABNORMAL HIGH (ref 70–99)
Glucose-Capillary: 232 mg/dL — ABNORMAL HIGH (ref 70–99)
Glucose-Capillary: 236 mg/dL — ABNORMAL HIGH (ref 70–99)
Glucose-Capillary: 259 mg/dL — ABNORMAL HIGH (ref 70–99)
Glucose-Capillary: 260 mg/dL — ABNORMAL HIGH (ref 70–99)
Glucose-Capillary: 273 mg/dL — ABNORMAL HIGH (ref 70–99)
Glucose-Capillary: 275 mg/dL — ABNORMAL HIGH (ref 70–99)
Glucose-Capillary: 279 mg/dL — ABNORMAL HIGH (ref 70–99)
Glucose-Capillary: 312 mg/dL — ABNORMAL HIGH (ref 70–99)

## 2019-04-19 LAB — CBC WITH DIFFERENTIAL/PLATELET
Abs Immature Granulocytes: 0.14 10*3/uL — ABNORMAL HIGH (ref 0.00–0.07)
Basophils Absolute: 0 10*3/uL (ref 0.0–0.1)
Basophils Relative: 0 %
Eosinophils Absolute: 0.3 10*3/uL (ref 0.0–0.5)
Eosinophils Relative: 2 %
HCT: 30.2 % — ABNORMAL LOW (ref 39.0–52.0)
Hemoglobin: 10.1 g/dL — ABNORMAL LOW (ref 13.0–17.0)
Immature Granulocytes: 1 %
Lymphocytes Relative: 7 %
Lymphs Abs: 1.2 10*3/uL (ref 0.7–4.0)
MCH: 28.5 pg (ref 26.0–34.0)
MCHC: 33.4 g/dL (ref 30.0–36.0)
MCV: 85.3 fL (ref 80.0–100.0)
Monocytes Absolute: 1.8 10*3/uL — ABNORMAL HIGH (ref 0.1–1.0)
Monocytes Relative: 11 %
Neutro Abs: 13.7 10*3/uL — ABNORMAL HIGH (ref 1.7–7.7)
Neutrophils Relative %: 79 %
Platelets: 190 10*3/uL (ref 150–400)
RBC: 3.54 MIL/uL — ABNORMAL LOW (ref 4.22–5.81)
RDW: 13.6 % (ref 11.5–15.5)
WBC: 17.1 10*3/uL — ABNORMAL HIGH (ref 4.0–10.5)
nRBC: 0 % (ref 0.0–0.2)

## 2019-04-19 LAB — BASIC METABOLIC PANEL
Anion gap: 10 (ref 5–15)
BUN: 35 mg/dL — ABNORMAL HIGH (ref 8–23)
CO2: 27 mmol/L (ref 22–32)
Calcium: 8.1 mg/dL — ABNORMAL LOW (ref 8.9–10.3)
Chloride: 93 mmol/L — ABNORMAL LOW (ref 98–111)
Creatinine, Ser: 1.34 mg/dL — ABNORMAL HIGH (ref 0.61–1.24)
GFR calc Af Amer: >60 mL/min (ref >60)
GFR calc non Af Amer: 57 mL/min — ABNORMAL LOW (ref >60)
Glucose, Bld: 178 mg/dL — ABNORMAL HIGH (ref 70–99)
Potassium: 4.2 mmol/L (ref 3.5–5.1)
Sodium: 130 mmol/L — ABNORMAL LOW (ref 135–145)

## 2019-04-19 LAB — POCT I-STAT 7, (LYTES, BLD GAS, ICA,H+H)
Acid-Base Excess: 6 mmol/L — ABNORMAL HIGH (ref 0.0–2.0)
Bicarbonate: 30.7 mmol/L — ABNORMAL HIGH (ref 20.0–28.0)
Calcium, Ion: 1.14 mmol/L — ABNORMAL LOW (ref 1.15–1.40)
HCT: 29 % — ABNORMAL LOW (ref 39.0–52.0)
Hemoglobin: 9.9 g/dL — ABNORMAL LOW (ref 13.0–17.0)
O2 Saturation: 99 %
Patient temperature: 38.2
Potassium: 4.3 mmol/L (ref 3.5–5.1)
Sodium: 130 mmol/L — ABNORMAL LOW (ref 135–145)
TCO2: 32 mmol/L (ref 22–32)
pCO2 arterial: 44.6 mmHg (ref 32.0–48.0)
pH, Arterial: 7.45 (ref 7.350–7.450)
pO2, Arterial: 156 mmHg — ABNORMAL HIGH (ref 83.0–108.0)

## 2019-04-19 LAB — HEPARIN LEVEL (UNFRACTIONATED)
Heparin Unfractionated: <0.1 IU/mL — ABNORMAL LOW (ref 0.30–0.70)
Heparin Unfractionated: <0.1 IU/mL — ABNORMAL LOW (ref 0.30–0.70)
Heparin Unfractionated: <0.1 IU/mL — ABNORMAL LOW (ref 0.30–0.70)

## 2019-04-19 LAB — MAGNESIUM
Magnesium: 1.8 mg/dL (ref 1.7–2.4)
Magnesium: 1.9 mg/dL (ref 1.7–2.4)

## 2019-04-19 LAB — COOXEMETRY PANEL
Carboxyhemoglobin: 1.2 % (ref 0.5–1.5)
Methemoglobin: 0.9 % (ref 0.0–1.5)
O2 Saturation: 82.3 %
Total hemoglobin: 10 g/dL — ABNORMAL LOW (ref 12.0–16.0)

## 2019-04-19 LAB — LACTATE DEHYDROGENASE: LDH: 326 U/L — ABNORMAL HIGH (ref 98–192)

## 2019-04-19 LAB — APTT: aPTT: 46 seconds — ABNORMAL HIGH (ref 24–36)

## 2019-04-19 LAB — PHOSPHORUS
Phosphorus: 4.2 mg/dL (ref 2.5–4.6)
Phosphorus: 3.5 mg/dL (ref 2.5–4.6)

## 2019-04-19 MED ORDER — ACETAMINOPHEN 160 MG/5ML PO SOLN
650.0000 mg | ORAL | Status: DC | PRN
  Administered 2019-04-19: 650 mg
  Filled 2019-04-19: qty 20.3

## 2019-04-19 MED ORDER — VANCOMYCIN HCL 1250 MG/250ML IV SOLN: 1250.0000 mg | INTRAVENOUS | Status: DC

## 2019-04-19 MED ORDER — SODIUM CHLORIDE 0.9 % IV SOLN
2.0000 g | Freq: Two times a day (BID) | INTRAVENOUS | Status: DC
  Administered 2019-04-20 (×2): 2 g via INTRAVENOUS
  Filled 2019-04-19 (×3): qty 2

## 2019-04-19 MED ORDER — ACETAMINOPHEN 160 MG/5ML PO SOLN: 650.0000 mg | Freq: Four times a day (QID) | ORAL | Status: DC | PRN

## 2019-04-19 MED ORDER — VANCOMYCIN HCL 1250 MG/250ML IV SOLN
1250.0000 mg | Freq: Once | INTRAVENOUS | Status: AC
  Administered 2019-04-20: 1250 mg via INTRAVENOUS
  Filled 2019-04-19: qty 250

## 2019-04-19 NOTE — Progress Notes (Signed)
ANTICOAGULATION CONSULT NOTE  Pharmacy Consult for Heparin Indication: chest pain/ACS  Heparin Dosing Weight: 68.2 kg  Labs: Recent Labs    04/20/2019 0348 05/05/2019 0348 05/06/2019 0820 05/16/2019 1018 05/14/2019 1018 05/10/2019 1410 05/16/2019 2016 05/15/2019 2016 04/19/19 0047 04/19/19 0313 04/19/19 0408 04/19/19 0753  HGB 9.9*  --    < > 9.2*   < >  --  10.5*   < >  --  10.1* 9.9*  --   HCT 30.4*  --    < > 27.0*   < >  --  31.7*  --   --  30.2* 29.0*  --   PLT 256  --   --   --   --   --  210  --   --  190  --   --   APTT  --   --   --   --   --   --   --   --   --  46*  --   --   HEPARINUNFRC 0.46   < >  --   --   --  <0.10*  --   --  <0.10*  --   --  <0.10*  CREATININE 1.52*  --    < > 1.70*  --   --  1.60*  --   --  1.34*  --   --    < > = values in this interval not displayed.    Assessment: 64 yo male presenting to [facility] with malaise and SOB for 4 days found to have elevated troponin and new depressed EF. Transferred to [facility] for further evaluation. Patient was started on heparin IV on 3/26, no anticoagulation PTA.   Patient now s/p cath 3/30 showing severe 3-vessel obstructive CAD, high LV filling pressures, reduced cardiac output, and moderate pulmonary HTN. After cath patient was transfered to the ICU and place PICC for IV milrinone, considering CABG.   Received impella 5.5 support on 4/2 - plan for CABG next week. Currently on standard heparin purge solution - purge flow is 8.4 mL/hr (420 units/hr) and systemic heparin 300 uts/hr HL undetectable.  No bleeding or issues with infusion per discussion with RN. H/h stable will slowly increase to goal  Goal of Therapy:  Heparin level target 0.2-0.25 per MD dicsussion Monitor platelets by anticoagulation protocol: Yes   Plan:  Increase systemic IV heparin to 400 units/hr  Continue purge heparin infusion. Check heparin level this evening  Monitor daily heparin level and CBC, s/sx bleeding  Bonnita Nasuti Pharm.D. CPP,  BCPS Clinical Pharmacist [phone] 04/19/2019 11:18 AM

## 2019-04-19 NOTE — Anesthesia Postprocedure Evaluation (Signed)
Anesthesia Post Note  Patient: Brendan [name]  Procedure(s) Performed: Placement of Impella 5.5 Direct (Right Axilla) TRANSESOPHAGEAL ECHOCARDIOGRAM (TEE) (N/A )     Patient location during evaluation: SICU Anesthesia Type: General Level of consciousness: sedated and patient remains intubated per anesthesia plan Pain management: pain level controlled Vital Signs Assessment: post-procedure vital signs reviewed and stable Respiratory status: patient remains intubated per anesthesia plan and patient on ventilator - see flowsheet for VS Cardiovascular status: stable Anesthetic complications: no    Last Vitals:  Vitals:   04/19/19 2100 04/19/19 2322  BP: 105/71   Pulse: 72 74  Resp: 15 16  Temp: (!) 38.1 C   SpO2: 100% 100%    Last Pain:  Vitals:   04/19/19 2000  TempSrc: Core  PainSc: 1                  Arel Tippen COKER

## 2019-04-19 NOTE — Progress Notes (Signed)
RT assessed pts tube position and found tube 23 @ lip. Pt prior to OR was 26 @ lip. RT advanced tube to 25 @ lip. [facility] MD aware and is ordering a CXR for tube placement verification. Pt respiratory status is stable at this time. Pt ventilating and oxygenating well at this time. RT will continue to monitor.

## 2019-04-19 NOTE — Addendum Note (Signed)
Addendum  created 04/19/19 2332 by Roberts Gaudy, MD   Clinical Note Signed

## 2019-04-19 NOTE — Progress Notes (Addendum)
NAME:  Brendan [name], MRN:  [id number], DOB:  December 02, 1955, LOS: 5 ADMISSION DATE:  03/29/2019, CONSULTATION DATE:  04/21/2019 REFERRING MD:  Dr. Haroldine Laws, CHIEF COMPLAINT:  Respiratory distress  Brief History   54 yoM originally presented with SOB and fatigue at [facility] found to have new HFrEF,  NSTEMI, and bilateral pleural effusions transferred to [facility] on 3/29 for further cardiac evaluation.  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  Started on lasix and milrinone gtts however developed NSVT.  Was taken 4/1 for placement of IABP and swan for optimization prior to CABG.  Was on precedex for agitation/ confusion, some concern for DTs.  On the evening of 4/1, patient developed worsening respiratory distress and hypoxia, PCCM consulted for intubation and vent management.   History of present illness   HPI obtained from medical chart review as patient is s/p emergent intubation and unable to provide history.    64 year old male with prior history of tobacco abuse, HTN, poorly controlled diabetes, and diabetic neuropathy who presented to [facility] on 3/26 with complaints of 4 days history of general malaise and shortness of breath.  He was found to have elevated troponin's, inferior wall EKG changes, and new systolic heart failure with EF 25%.  There was some concern for possible pneumonia vs pulmonary edema and was empirically on ceftriaxone and doxycycline, of which were since stopped given negative procalcitonin and no leukocytosis.  He was transferred to [facility] on 3/29 for further cardiac evaluation and consideration of cardiac catheterization.   At [facility], underwent left and right heart cath which showed severe 3 vessel obstructive CAD.  Repeat echo showed EF 30-35%, normal RV, and moderate to severe MR with CVP of 25 and LVEDP of 35 with CI 1.9- 2.0.  Heart failure team consulted and he was started on lasix gtt and milrinone gtts.  His shortness of breath did improve with diuresis.  TCTS were consulted for  CABG.  However, patient developed multiple PVCs and short runs of NSVT.  He was placed on amiodarone and milrinone held however coox worsened.  Milrinone was restarted and required the addition of lidocaine gtt for ongoing NSVT.   On 4/1, he was taken back for RHC and placement of IABP/ PA catheter to help optimize patient prior to CABG.  Throughout the day 4/1, he became increasing agitated despite precedex gtt.  Some concern for possible DTs.  He has intermittently required norepi for hemodynamic support and remained on 1:1 IABP support.  On the evening of 4/1, patient developed worsening hypoxia despite NRB and increased work of breathing this evening.  PCCM consulted for emergent intubation and mechanical ventilation management.   Past Medical History  Tobacco abuse, HTN, poorly controlled diabetes, diabetic neuropathy   Significant Hospital Events   3/26 presented to [facility] 3/29 tx to [facility]  3/30 [facility] 4/1  RHC/ IABP/ PA cath 4/2 Impella placement; bilateral CT placement  Consults:  TCTS PCCM  Procedures:  3/30 R/ LHC  4/1 RHC  4/1 IABP R groin >>4/2 4/1 R IJ sheath/ PA cath >> 4/1 Left radial art line >> 4/1 ETT >> 4/2 Impella 4/2 Chest tube right>> 4/2 Chest tube left>>  Significant Diagnostic Tests:   3/30 R/ LHC >> Ost LM to Mid LM lesion is 35% stenosed. Prox LAD lesion is 90% stenosed. Prox LAD to Mid LAD lesion is 50% stenosed. Mid Cx lesion is 100% stenosed. Prox RCA to Mid RCA lesion is 90% stenosed. RPDA lesion is  95% stenosed. LV end diastolic pressure is severely elevated. Hemodynamic findings consistent with moderate pulmonary hypertension. 1. Severe 3 vessel obstructive CAD 2. High LV filling pressures 3. Reduced cardiac output with index 2.3 4. Moderate pulmonary HTN.  3/30 TTE >> 1. Left ventricular ejection fraction, by estimation, is 30 to 35%. The left ventricle has moderately decreased function. The left ventricle demonstrates regional  wall motion abnormalities.There is mild left ventricular hypertrophy. Left ventricular diastolic function could not be evaluated. There is severe hypokinesis of the left ventricular, entire inferior wall, inferoseptal wall, apical segment and lateral wall.   2. Right ventricular systolic function is hyperdynamic. The right ventricular size is normal. There is moderately elevated pulmonary artery systolic pressure.   3. Decreased posterior leaflet motion due to ischemic tethering of the mitral valve leaflets.   4. The mitral valve is abnormal. Moderate to severe mitral valve regurgitation.   5. The aortic valve is tricuspid. Aortic valve regurgitation is not visualized. Mild aortic valve sclerosis is present, with no evidence of aortic valve stenosis.   6. The inferior vena cava is normal in size with greater than 50% respiratory variability, suggesting right atrial pressure of 3 mmHg.   4/1 CT chest w/o >> Large bilateral pleural effusions with associated atelectasis. Moderate to severe bilateral ground-glass opacities, likely reflecting pneumonia and/or edema.   4/1 RHC  >> RA = 18 RV = 60/20  PA = 63/29 (42) PCW = 33 Fick cardiac output/index = 3.7/1.9 PVR = 2.5 WU FA sat = 98% PA sat = 47%, 49% PaPi = 2.2  4/2 CXR>bilateral chest tubes present. Significantly improved since morning xray. Infiltrate vs atelectasis at right base.  Micro Data:  3/30 MRSA PCR >> neg 4/1 MRSA PCR >> neg 4/2 resp culture>>rare gram pos cocci  Antimicrobials:  PTA ceftriaxone/ doxycycline >> 3/29  Interim history/subjective:  No overnight events   Objective   Blood pressure 110/83, pulse 71, temperature 100.2 F (37.9 C), resp. rate 13, height 5\' 11"  (1.803 m), weight 64.7 kg, SpO2 100 %. PAP: (27-48)/(5-23) 31/5 CVP:  [0 mmHg-17 mmHg] 0 mmHg CO:  [4.8 L/min-6.7 L/min] 6.7 L/min CI:  [2.6 L/min/m2-3.6 L/min/m2] 3.6 L/min/m2  Vent Mode: PCV FiO2 (%):  [50 %-60 %] 50 % Set Rate:  [12 bmp] 12  bmp PEEP:  [5 cmH20] 5 cmH20 Plateau Pressure:  [16 cmH20-20 cmH20] 20 cmH20   Intake/Output Summary (Last 24 hours) at 04/19/2019 0738 Last data filed at 04/19/2019 0700 Gross per 24 hour  Intake 4640.74 ml  Output 6013 ml  Net -1372.26 ml   Filed Weights   05/14/2019 0452 04/30/2019 0630 04/19/19 0214  Weight: 68.4 kg 69.4 kg 64.7 kg   Examination: General:  Sedated, intubated HEENT: ETT Neuro: sedated CV: RRR. Mechanical sounds. Bilateral chest tubes with minimal output. PULM:  Bibasilar rhonchi GI: soft, bs+, foley Extremities: warm/dry, no LE edema  Skin: no rashes   Resolved Hospital Problem list    Assessment & Plan:   Acute respiratory insufficiency on mechanical ventilation Large bilateral pleural effusions -likely transudate 2/2 HFrEF and malnutrition. Bilateral chest tubes placed 4/2--minimal output overnight -Will likely need continued intubation until CABG. SBTs. Continue PCV. -VAP bundling  HFrEF. Cardiogenic shock. IABP placed 4/1--removed 4/2. impella placed 4/2. CVP/PA pressures down since impella placement yesterday. Now on levo and milrinone NSTEMI/ CAD with severe 3 vessel disease. CABG pending hemodynamic stability. Likely Monday or tuesday Severe mitral regurgitation.  NSVT. On amio drip Management Per HF and TCTS  Fever. Tmax overnight 100.9. white count stable from yesterday. Possible RLL infiltrate on CXR vs critical illness.  -Will continue to monitor fever curve and will likely start ceftriaxone empirically if fever reoccurs   Acute kidney injury. Cardiogenic shock vs diuresis vs contrast mediated. Renal function improving. Electrolytes stable. Will continue to monitor. Hyponatremia. Na 130  Acute encephalopathy of critical illness vs etoh withdrawal. Fentanyl/precedex for sedation  Poorly controlled diabetes. Continue insulin gtt  Post op blood loss anemia. hgb stable this morning.Transfuse for hgb <8  Protein calorie malnutrition. Tube feeds  started 4/2  Best practice:  Diet: tube feeds Pain/Anxiety/Delirium protocol (if indicated): fentanyl gtt/ versed prn,  VAP protocol (if indicated): yes DVT prophylaxis: heparin gtt GI prophylaxis: PPI Glucose control: changed to q 4 SSI with lantus Mobility: BR Code Status: Full  Family Communication: per primary  Disposition: ICU   Labs   CBC: Recent Labs  Lab 05/01/2019 0311 05/13/2019 0853 05/15/2019 1830 05/06/2019 2127 05/05/2019 0348 05/12/2019 0820 05/03/2019 0916 05/16/2019 1018 04/23/2019 2016 04/19/19 0313 04/19/19 0408  WBC 9.4  --  11.2*  --  17.5*  --   --   --  17.2* 17.1*  --   NEUTROABS  --   --   --   --   --   --   --   --   --  13.7*  --   HGB 9.9*   < > 9.7*   < > 9.9*   < > 9.5* 9.2* 10.5* 10.1* 9.9*  HCT 30.1*   < > 29.3*   < > 30.4*   < > 28.0* 27.0* 31.7* 30.2* 29.0*  MCV 87.0  --  86.9  --  86.4  --   --   --  85.0 85.3  --   PLT 282  --  261  --  256  --   --   --  210 190  --    < > = values in this interval not displayed.    Basic Metabolic Panel: Recent Labs  Lab 05/05/2019 0311 05/11/2019 0853 05/05/2019 1830 05/01/2019 2127 05/11/2019 0348 04/29/2019 0348 04/28/2019 0820 05/05/2019 0825 04/18/2019 0912 04/27/2019 0912 05/03/2019 0916 04/30/2019 1018 04/23/2019 1318 05/05/2019 1610 05/11/2019 2016 04/19/19 0313 04/19/19 0408  NA 131*   < > 133*   < > 132*   < > 130*   < > 131*   < > 131* 131*  --   --  131* 130* 130*  K 4.8   < > 4.9   < > 4.7   < > 4.0   < > 3.8   < > 3.8 3.9  --   --  4.5 4.2 4.3  CL 94*  --  98   < > 95*   < > 91*  --  93*  --   --  94*  --   --  93* 93*  --   CO2 23  --  24  --  25  --   --   --   --   --   --   --   --   --  27 27  --   GLUCOSE 412*  --  158*   < > 80   < > 128*  --  107*  --   --  91  --   --  105* 178*  --   BUN 38*  --  45*   < > 45*   < >  38*  --  37*  --   --  36*  --   --  38* 35*  --   CREATININE 1.13  --  1.56*   < > 1.52*   < > 1.80*  --  1.60*  --   --  1.70*  --   --  1.60* 1.34*  --   CALCIUM 8.5*  --  9.0  --  9.1  --    --   --   --   --   --   --   --   --  8.2* 8.1*  --   MG 2.2  --   --   --  2.1  --   --   --   --   --   --   --  1.9 1.9  --  1.8  --   PHOS  --   --   --   --   --   --   --   --   --   --   --   --  4.1 4.4  --  4.2  --    < > = values in this interval not displayed.   GFR: Estimated Creatinine Clearance: 53 mL/min (A) (by C-G formula based on SCr of 1.34 mg/dL (H)). Recent Labs  Lab 04/19/2019 1830 04/26/2019 0035 05/07/2019 0348 05/14/2019 2016 04/19/19 0313  WBC 11.2*  --  17.5* 17.2* 17.1*  LATICACIDVEN 2.2* 1.2  --   --   --     Liver Function Tests: Recent Labs  Lab 03/29/2019 2149 04/16/19 0448  AST 24 17  ALT 22 18  ALKPHOS 89 83  BILITOT 0.8 0.6  PROT 5.8* 5.3*  ALBUMIN 2.7* 2.5*   No results for input(s): LIPASE, AMYLASE in the last 168 hours. No results for input(s): AMMONIA in the last 168 hours.  ABG    Component Value Date/Time   PHART 7.450 04/19/2019 0408   PCO2ART 44.6 04/19/2019 0408   PO2ART 156.0 (H) 04/19/2019 0408   HCO3 30.7 (H) 04/19/2019 0408   TCO2 32 04/19/2019 0408   ACIDBASEDEF 3.0 (H) 05/11/2019 0853   ACIDBASEDEF 2.0 05/01/2019 0853   O2SAT 99.0 04/19/2019 0408     Coagulation Profile: Recent Labs  Lab 03/20/2019 2149  INR 1.3*    Cardiac Enzymes: No results for input(s): CKTOTAL, CKMB, CKMBINDEX, TROPONINI in the last 168 hours.  HbA1C: HbA1c, POC (controlled diabetic range)  Date/Time Value Ref Range Status  01/14/2019 02:02 PM 12.5 (A) 0.0 - 7.0 % Final  08/21/2017 02:03 PM 8.5 (A) 0.0 - 7.0 % Final   Hgb A1c MFr Bld  Date/Time Value Ref Range Status  03/28/2019 09:49 PM 9.0 (H) 4.8 - 5.6 % Final    Comment:    (NOTE) Pre diabetes:          5.7%-6.4% Diabetes:              >6.4% Glycemic control for   <7.0% adults with diabetes     CBG: Recent Labs  Lab 04/19/19 0319 04/19/19 0406 04/19/19 0516 04/19/19 0555 04/19/19 0657  GLUCAP 162* 193* 223* 236* 259*

## 2019-04-19 NOTE — Progress Notes (Signed)
Pt off fentanyl for 3 hrs and off precedex for 2 hrs.  Attempted to wean.  Several periods of apnea noted.  Dr Loanne Drilling aware and spoke with cardiology team.  Decision was made to put pt back on full support and try weaning again in am.

## 2019-04-19 NOTE — Progress Notes (Signed)
ANTICOAGULATION CONSULT NOTE  Pharmacy Consult for Heparin Indication: chest pain/ACS  Heparin Dosing Weight: 68.2 kg  Labs: Recent Labs    05/14/2019 0348 05/12/2019 0820 05/03/2019 1018 05/10/2019 1410 04/21/2019 2016 04/24/2019 2016 04/19/19 0047 04/19/19 0313 04/19/19 0408 04/19/19 0753 04/19/19 1635  HGB 9.9*   < > 9.2*   < > 10.5*   < >  --  10.1* 9.9*  --   --   HCT 30.4*   < > 27.0*   < > 31.7*  --   --  30.2* 29.0*  --   --   PLT 256  --   --   --  210  --   --  190  --   --   --   APTT  --   --   --   --   --   --   --  46*  --   --   --   HEPARINUNFRC 0.46  --   --    < >  --   --  <0.10*  --   --  <0.10* <0.10*  CREATININE 1.52*   < > 1.70*  --  1.60*  --   --  1.34*  --   --   --    < > = values in this interval not displayed.    Assessment: 64 yo male presenting to [facility] with malaise and SOB for 4 days found to have elevated troponin and new depressed EF. Transferred to [facility] for further evaluation. Patient was started on heparin IV on 3/26, no anticoagulation PTA.   Patient now s/p cath 3/30 showing severe 3-vessel obstructive CAD, high LV filling pressures, reduced cardiac output, and moderate pulmonary HTN. After cath patient was transfered to the ICU and place PICC for IV milrinone, considering CABG.    Received impella 5.5 support on 4/2 - plan for CABG next week. Purge flow is 8.8 ml/hr (440 units/h) with systemic running at 400 units/h (total 840 units/h). Repeat heparin level remains undetectable.   Goal of Therapy:  Heparin level target 0.2-0.25 per MD dicsussion Monitor platelets by anticoagulation protocol: Yes   Plan:  Increase systemic IV heparin to 550 units/hr  Continue purge heparin infusion Check heparin level with am labs Monitor daily heparin level and CBC, s/sx bleeding  Arrie Senate, PharmD, BCPS Clinical Pharmacist [phone] Please check AMION for all [place] numbers 04/19/2019

## 2019-04-19 NOTE — Progress Notes (Signed)
ANTICOAGULATION CONSULT NOTE  Pharmacy Consult for Heparin Indication: chest pain/ACS  Heparin Dosing Weight: 68.2 kg  Labs: Recent Labs    05/14/2019 1830 05/07/2019 2127 04/29/2019 0348 05/14/2019 0820 05/08/2019 0912 05/09/2019 0912 05/06/2019 0916 05/10/2019 0916 04/28/2019 1018 05/16/2019 1410 04/21/2019 2016 04/19/19 0047  HGB 9.7*   < > 9.9*   < > 8.8*   < > 9.5*   < > 9.2*  --  10.5*  --   HCT 29.3*   < > 30.4*   < > 26.0*   < > 28.0*  --  27.0*  --  31.7*  --   PLT 261  --  256  --   --   --   --   --   --   --  210  --   HEPARINUNFRC  --   --  0.46  --   --   --   --   --   --  <0.10*  --  <0.10*  CREATININE 1.56*   < > 1.52*   < > 1.60*  --   --   --  1.70*  --  1.60*  --    < > = values in this interval not displayed.    Assessment: 64 yo male presenting to [facility] with malaise and SOB for 4 days found to have elevated troponin and new depressed EF. Transferred to [facility] for further evaluation. Patient was started on heparin IV on 3/26, no anticoagulation PTA.   Patient now s/p cath 3/30 showing severe 3-vessel obstructive CAD, high LV filling pressures, reduced cardiac output, and moderate pulmonary HTN. After cath patient was transfered to the ICU and place PICC for IV milrinone, considering CABG.   Received impella 5.5 support on 4/2 - plan for CABG next week. Currently on standard heparin purge solution - purge flow is 8.6 mL/hr (430 units/hr) and systemic heparin was added at 150 units/hr. Heparin level remains undetectable. No bleeding or issues with infusion per discussion with RN.  Goal of Therapy:  Heparin level 0.2-0.5 units/ml - for first 12 hours will target 0.2-0.25 Monitor platelets by anticoagulation protocol: Yes   Plan:  Increase systemic IV heparin to 300 units/hr  Continue purge heparin infusion. Check heparin level in 6 hours Monitor daily heparin level and CBC, s/sx bleeding   Arturo Morton, PharmD, BCPS Please check AMION for all [place]  contact numbers Clinical Pharmacist 04/19/2019 1:39 AM

## 2019-04-19 NOTE — Anesthesia Postprocedure Evaluation (Signed)
Anesthesia Post Note  Patient: Brendan [name]  Procedure(s) Performed: Placement of Impella 5.5 Direct (Right Axilla) TRANSESOPHAGEAL ECHOCARDIOGRAM (TEE) (N/A )     Patient location during evaluation: PACU Anesthesia Type: General Level of consciousness: awake and alert Pain management: pain level controlled Vital Signs Assessment: post-procedure vital signs reviewed and stable Respiratory status: spontaneous breathing, nonlabored ventilation, respiratory function stable and patient connected to nasal cannula oxygen Cardiovascular status: blood pressure returned to baseline and stable Postop Assessment: no apparent nausea or vomiting Anesthetic complications: no    Last Vitals:  Vitals:   04/19/19 2100 04/19/19 2322  BP: 105/71   Pulse: 72 74  Resp: 15 16  Temp: (!) 38.1 C   SpO2: 100% 100%    Last Pain:  Vitals:   04/19/19 2000  TempSrc: Core  PainSc: 1                  Annaliyah Willig COKER

## 2019-04-19 NOTE — Progress Notes (Signed)
Attempted wean trial per MD. Pt went apneic and was placed back on full support mode. Will attempt again at a later time.

## 2019-04-19 NOTE — Progress Notes (Signed)
Pharmacy Antibiotic Note  Brendan [name] is a 64 y.o. male admitted on 03/22/2019, now with concern for bacteremia.  Pharmacy has been consulted for vancomycin dosing.  Plan: Vancomycin 1250mg  IV Q24H. Goal AUC 400-550.  Expected AUC 480.  SCr used 1.34.  Change cefepime to 2g IV Q12H.  Height: 5\' 11"  (180.3 cm)(measured x 3) Weight: 64.7 kg (142 lb 10.2 oz) IBW/kg (Calculated) : 75.3  Temp (24hrs), Avg:100.1 F (37.8 C), Min:99.1 F (37.3 C), Max:101.1 F (38.4 C)  Recent Labs  Lab 05/06/2019 0311 05/15/2019 0311 04/19/2019 1830 05/07/2019 1830 04/24/2019 0035 05/07/2019 0348 04/19/2019 0348 05/01/2019 0820 04/19/2019 0912 04/28/2019 1018 05/13/2019 2016 04/19/19 0313  WBC 9.4  --  11.2*  --   --  17.5*  --   --   --   --  17.2* 17.1*  CREATININE 1.13   < > 1.56*   < >  --  1.52*   < > 1.80* 1.60* 1.70* 1.60* 1.34*  LATICACIDVEN  --   --  2.2*  --  1.2  --   --   --   --   --   --   --    < > = values in this interval not displayed.    Estimated Creatinine Clearance: 53 mL/min (A) (by C-G formula based on SCr of 1.34 mg/dL (H)).    Allergies  Allergen Reactions  . Acetaminophen Itching    Thank you for allowing pharmacy to be a part of this patient's care.  Wynona Neat, PharmD, BCPS  04/19/2019 11:55 PM

## 2019-04-19 NOTE — Progress Notes (Signed)
Advanced Heart Failure Rounding Note  PCP-Cardiologist: No primary care provider on file.   Subjective:    Events - Presented to [facility] with acute HF  EF 20-25% - Transferred to con - Cath 3/30 with severe 3 vessel disease. EF 25% - Developed PMVT on milrinone -> milrinone stopped -> developed worsening shock -> IABP placed on 4/1 - Clinical deterioration 4/1-> intubated - 4/2 underwent Impella 5.5 placement earlier and bilateral chest tubes with 3L out from each side.   Impella remains at P-8.  Flow 4.3 No alarms Good waveforms   Sedated on vent. On NE 11 and milrinone. Rhythm stable on amio 60. Off lido. Urine output picking up. Creatinine improved.   Swan #s CVP 3-4 PA 32/10 Thermo 7.0/3.8 Co-ox 82%  Objective:   Weight Range: 64.7 kg Body mass index is 19.89 kg/m.   Vital Signs:   Temp:  [96.8 F (36 C)-101.1 F (38.4 C)] 99.3 F (37.4 C) (04/03 1100) Pulse Rate:  [68-76] 70 (04/03 1030) Resp:  [0-20] 11 (04/03 1100) BP: (89-138)/(62-88) 100/77 (04/03 1100) SpO2:  [99 %-100 %] 100 % (04/03 1128) Arterial Line BP: (95-138)/(58-76) 112/64 (04/03 1100) FiO2 (%):  [40 %-60 %] 40 % (04/03 1128) Weight:  [64.7 kg] 64.7 kg (04/03 0214) Last BM Date: 03/25/2019  Weight change: Filed Weights   05/15/2019 0452 04/30/2019 0630 04/19/19 0214  Weight: 68.4 kg 69.4 kg 64.7 kg    Intake/Output:   Intake/Output Summary (Last 24 hours) at 04/19/2019 1247 Last data filed at 04/19/2019 1200 Gross per 24 hour  Intake 4406.03 ml  Output 3218 ml  Net 1188.03 ml      Physical Exam   General:  Sedated on vent HEENT: normal + ETT Neck: supple. RIJ swan Carotids 2+ bilat; no bruits. No lymphadenopathy or thryomegaly appreciated. Cor: PMI nondisplaced. R axillary impella  Regular rate & rhythm. No rubs, gallops or murmurs. Lungs: coarse  Abdomen: soft, nontender, nondistended. No hepatosplenomegaly. No bruits or masses. Good bowel sounds. Extremities: no cyanosis,  clubbing, rash, edema Neuro: sedated on vent    Telemetry   SR 70s Personally reviewed   Labs    CBC Recent Labs    05/01/2019 2016 05/07/2019 2016 04/19/19 0313 04/19/19 0408  WBC 17.2*  --  17.1*  --   NEUTROABS  --   --  13.7*  --   HGB 10.5*   < > 10.1* 9.9*  HCT 31.7*   < > 30.2* 29.0*  MCV 85.0  --  85.3  --   PLT 210  --  190  --    < > = values in this interval not displayed.   Basic Metabolic Panel Recent Labs    04/24/2019 1018 05/14/2019 1610 05/14/2019 2016 04/24/2019 2016 04/19/19 0313 04/19/19 0408  NA   < >  --  131*   < > 130* 130*  K   < >  --  4.5   < > 4.2 4.3  CL   < >  --  93*  --  93*  --   CO2  --   --  27  --  27  --   GLUCOSE   < >  --  105*  --  178*  --   BUN   < >  --  38*  --  35*  --   CREATININE   < >  --  1.60*  --  1.34*  --   CALCIUM  --   --  8.2*  --  8.1*  --   MG  --  1.9  --   --  1.8  --   PHOS  --  4.4  --   --  4.2  --    < > = values in this interval not displayed.   Liver Function Tests No results for input(s): AST, ALT, ALKPHOS, BILITOT, PROT, ALBUMIN in the last 72 hours. No results for input(s): LIPASE, AMYLASE in the last 72 hours. Cardiac Enzymes No results for input(s): CKTOTAL, CKMB, CKMBINDEX, TROPONINI in the last 72 hours.  BNP: BNP (last 3 results) Recent Labs    03/19/2019 0113  BNP 655.7*    ProBNP (last 3 results) No results for input(s): PROBNP in the last 8760 hours.   D-Dimer No results for input(s): DDIMER in the last 72 hours. Hemoglobin A1C No results for input(s): HGBA1C in the last 72 hours. Fasting Lipid Panel No results for input(s): CHOL, HDL, LDLCALC, TRIG, CHOLHDL, LDLDIRECT in the last 72 hours. Thyroid Function Tests No results for input(s): TSH, T4TOTAL, T3FREE, THYROIDAB in the last 72 hours.  Invalid input(s): FREET3  Other results:   Imaging    DG Chest Port 1 View  Result Date: 04/19/2019 CLINICAL DATA:  LEFT ventricular assist device present. History of heart failure,  low cardiac output, pulmonary congestion and respiratory distress. EXAM: PORTABLE CHEST 1 VIEW COMPARISON:  Chest x-rays dated 05/13/2019 and 05/13/2019. FINDINGS: Support apparatus is stable in position. Tip of the Swan-Ganz catheter remains slightly to the RIGHT of midline. No pleural effusion or pneumothorax is seen. Probable atelectasis at the RIGHT lung base. No confluent airspace opacities to suggest a developing pneumonia. Heart size and mediastinal contours are stable. IMPRESSION: 1. Stable chest x-ray. Probable atelectasis at the RIGHT lung base. No evidence of pneumonia or pulmonary edema. 2. Stable position of the support apparatus. Electronically Signed   By: Franki Cabot M.D.   On: 04/19/2019 07:02   DG Chest Port 1 View  Result Date: 05/10/2019 CLINICAL DATA:  Endotracheal tube. EXAM: PORTABLE CHEST 1 VIEW COMPARISON:  05/07/2019 FINDINGS: The endotracheal tube terminates above the carina. Bilateral chest tubes are noted. There is a Swan-Ganz catheter in place with tip projecting over the right lower lobe pulmonary artery. There is a left ventricular assist device in place. There is no convincing pneumothorax. Again noted are findings of congestive heart failure/volume overload. The enteric tube terminates over the gastric body. The side hole terminates near the GE junction. There is a right-sided PICC line in place with tip terminating near the cavoatrial junction. IMPRESSION: 1. Lines and tubes as above.  No pneumothorax. 2. Otherwise, stable appearance of the chest. Electronically Signed   By: Constance Holster M.D.   On: 04/23/2019 22:05     Medications:     Scheduled Medications: . aspirin EC  81 mg Oral Daily  . chlorhexidine gluconate (MEDLINE KIT)  15 mL Mouth Rinse BID  . Chlorhexidine Gluconate Cloth  6 each Topical Daily  . digoxin  0.125 mg Oral Daily  . feeding supplement (PRO-STAT SUGAR FREE 64)  30 mL Per Tube BID  . fentaNYL (SUBLIMAZE) injection  100 mcg Intravenous  Once  . gabapentin  100 mg Per Tube Q8H  . mouth rinse  15 mL Mouth Rinse 10 times per day  . multivitamin with minerals  1 tablet Per Tube Daily  . oxyCODONE  10 mg Per Tube Q6H  . pantoprazole (PROTONIX) IV  40 mg Intravenous Q24H  .  pneumococcal 23 valent vaccine  0.5 mL Intramuscular Tomorrow-1000  . rosuvastatin  40 mg Oral q1800  . sodium chloride flush  10-40 mL Intracatheter Q12H  . sodium chloride flush  3 mL Intravenous Q12H  . sodium chloride flush  3 mL Intravenous Q12H  . sodium chloride flush  3 mL Intravenous Q12H  . spironolactone  12.5 mg Oral Daily    Infusions: . sodium chloride    . sodium chloride    . amiodarone 30 mg/hr (04/19/19 1101)  . dexmedetomidine (PRECEDEX) IV infusion 0.2 mcg/kg/hr (04/19/19 1100)  . feeding supplement (VITAL 1.5 CAL) 1,000 mL (05/14/2019 1615)  . feeding supplement (VITAL 1.5 CAL) 1,000 mL (04/19/19 1206)  . fentaNYL infusion INTRAVENOUS Stopped (04/19/19 1128)  . impella catheter heparin 50 unit/mL in dextrose 5%    . heparin 400 Units/hr (04/19/19 1103)  . insulin 0.9 mL/hr at 04/19/19 1100  . lactated ringers 20 mL/hr at 04/19/19 1100  . milrinone 0.25 mcg/kg/min (04/19/19 1100)  . norepinephrine (LEVOPHED) Adult infusion 9 mcg/min (04/19/19 1100)    PRN Medications: sodium chloride, sodium chloride, acetaminophen, bisacodyl, docusate, fentaNYL, fentaNYL (SUBLIMAZE) injection, fentaNYL (SUBLIMAZE) injection, hydrALAZINE, midazolam, midazolam, nitroGLYCERIN, ondansetron (ZOFRAN) IV, ondansetron (ZOFRAN) IV, sodium chloride flush, sodium chloride flush, sodium chloride flush, traMADol     Assessment/Plan    1.Acute systolic HF ->  Cardiogenic Shock  - Due to iCM - Impella 5.5 placed on 4/2. Hemodynamics much improved. CO normalized - Volume status looks good. Off lasix. Can add back as needed - Wean NE as tolerated. Continue milrinone - Continue digoxin and spiro - Plan high risk CABG/MVR on Tuesday with Impella  support  2. CAD - LHC with severe 3 vessel CAD  - Plan as above.   3. Acute hypoxic respiratory failure - intubated 4/1 - CCM managing vent - Remains intubated. Respiratory status improved after drainage of bilateral effusions - Will wean vent and try to extubated later today  4. Mitral Regurgitation - Mod-severe on ECHO - Plan MVR at time of CABG  5. Uncontrolled DM -Hgb A1C 9.  - On insulin and sliding scale.   6. AKI - due to shock - improved with hemodynamic support  7. Acute blood loss Anemia  -transfuse as need to keep Hgb > 7.5  8. Polymorphic VT - Off lido. On amio 60. Improved - Can decrease amio to 30   -Keep K >4  -Keep  Mag >2   9. Neuropathy -Very limited with severe neuropathy. No sensation R and L foot and occasionaly in his hands. Requires assistance with ADLs.   10. Severe Malnutrition -Prealbumin 8.9  -Nutrition on board - Continue TFs  11. Leukocytosis - stable at 17K - no cleare source of infection - low threshold to start abx  CRITICAL CARE Performed by: Glori Bickers  Total critical care time: 40 minutes  Critical care time was exclusive of separately billable procedures and treating other patients.  Critical care was necessary to treat or prevent imminent or life-threatening deterioration.  Critical care was time spent personally by me (independent of midlevel providers or residents) on the following activities: development of treatment plan with patient and/or surrogate as well as nursing, discussions with consultants, evaluation of patient's response to treatment, examination of patient, obtaining history from patient or surrogate, ordering and performing treatments and interventions, ordering and review of laboratory studies, ordering and review of radiographic studies, pulse oximetry and re-evaluation of patient's condition.    Length of Stay: [place]  Esiquio Boesen, MD  04/19/2019, 12:47 PM  Advanced Heart Failure  Team Pager [phone] (M-F; 7a - 4p)  Please contact [facility] for night-coverage after hours (4p -7a ) and weekends on [email or web address]

## 2019-04-20 LAB — CBC WITH DIFFERENTIAL/PLATELET
Abs Immature Granulocytes: 0.07 10*3/uL (ref 0.00–0.07)
Basophils Absolute: 0 10*3/uL (ref 0.0–0.1)
Basophils Relative: 0 %
Eosinophils Absolute: 0.3 10*3/uL (ref 0.0–0.5)
Eosinophils Relative: 2 %
HCT: 26.3 % — ABNORMAL LOW (ref 39.0–52.0)
Hemoglobin: 8.9 g/dL — ABNORMAL LOW (ref 13.0–17.0)
Immature Granulocytes: 1 %
Lymphocytes Relative: 6 %
Lymphs Abs: 0.9 10*3/uL (ref 0.7–4.0)
MCH: 28.5 pg (ref 26.0–34.0)
MCHC: 33.8 g/dL (ref 30.0–36.0)
MCV: 84.3 fL (ref 80.0–100.0)
Monocytes Absolute: 1.6 10*3/uL — ABNORMAL HIGH (ref 0.1–1.0)
Monocytes Relative: 11 %
Neutro Abs: 11.2 10*3/uL — ABNORMAL HIGH (ref 1.7–7.7)
Neutrophils Relative %: 80 %
Platelets: 131 10*3/uL — ABNORMAL LOW (ref 150–400)
RBC: 3.12 MIL/uL — ABNORMAL LOW (ref 4.22–5.81)
RDW: 13.2 % (ref 11.5–15.5)
WBC: 14 10*3/uL — ABNORMAL HIGH (ref 4.0–10.5)
nRBC: 0 % (ref 0.0–0.2)

## 2019-04-20 LAB — URINALYSIS, ROUTINE W REFLEX MICROSCOPIC
Bilirubin Urine: NEGATIVE
Glucose, UA: >=500 mg/dL — AB
Ketones, ur: NEGATIVE mg/dL
Leukocytes,Ua: NEGATIVE
Nitrite: NEGATIVE
Protein, ur: 30 mg/dL — AB
RBC / HPF: >50 RBC/hpf — ABNORMAL HIGH (ref 0–5)
Specific Gravity, Urine: 1.015 (ref 1.005–1.030)
pH: 5 (ref 5.0–8.0)

## 2019-04-20 LAB — GLUCOSE, CAPILLARY
Glucose-Capillary: 106 mg/dL — ABNORMAL HIGH (ref 70–99)
Glucose-Capillary: 109 mg/dL — ABNORMAL HIGH (ref 70–99)
Glucose-Capillary: 114 mg/dL — ABNORMAL HIGH (ref 70–99)
Glucose-Capillary: 137 mg/dL — ABNORMAL HIGH (ref 70–99)
Glucose-Capillary: 140 mg/dL — ABNORMAL HIGH (ref 70–99)
Glucose-Capillary: 142 mg/dL — ABNORMAL HIGH (ref 70–99)
Glucose-Capillary: 147 mg/dL — ABNORMAL HIGH (ref 70–99)
Glucose-Capillary: 150 mg/dL — ABNORMAL HIGH (ref 70–99)
Glucose-Capillary: 154 mg/dL — ABNORMAL HIGH (ref 70–99)
Glucose-Capillary: 163 mg/dL — ABNORMAL HIGH (ref 70–99)
Glucose-Capillary: 164 mg/dL — ABNORMAL HIGH (ref 70–99)
Glucose-Capillary: 167 mg/dL — ABNORMAL HIGH (ref 70–99)
Glucose-Capillary: 176 mg/dL — ABNORMAL HIGH (ref 70–99)
Glucose-Capillary: 183 mg/dL — ABNORMAL HIGH (ref 70–99)
Glucose-Capillary: 196 mg/dL — ABNORMAL HIGH (ref 70–99)
Glucose-Capillary: 210 mg/dL — ABNORMAL HIGH (ref 70–99)
Glucose-Capillary: 83 mg/dL (ref 70–99)
Glucose-Capillary: 97 mg/dL (ref 70–99)

## 2019-04-20 LAB — BASIC METABOLIC PANEL
Anion gap: 9 (ref 5–15)
BUN: 33 mg/dL — ABNORMAL HIGH (ref 8–23)
CO2: 26 mmol/L (ref 22–32)
Calcium: 7.6 mg/dL — ABNORMAL LOW (ref 8.9–10.3)
Chloride: 96 mmol/L — ABNORMAL LOW (ref 98–111)
Creatinine, Ser: 1.13 mg/dL (ref 0.61–1.24)
GFR calc Af Amer: >60 mL/min (ref >60)
GFR calc non Af Amer: >60 mL/min (ref >60)
Glucose, Bld: 134 mg/dL — ABNORMAL HIGH (ref 70–99)
Potassium: 4 mmol/L (ref 3.5–5.1)
Sodium: 131 mmol/L — ABNORMAL LOW (ref 135–145)

## 2019-04-20 LAB — COOXEMETRY PANEL
Carboxyhemoglobin: 1.5 % (ref 0.5–1.5)
Methemoglobin: 1 % (ref 0.0–1.5)
O2 Saturation: 80.7 %
Total hemoglobin: 8.9 g/dL — ABNORMAL LOW (ref 12.0–16.0)

## 2019-04-20 LAB — POCT I-STAT 7, (LYTES, BLD GAS, ICA,H+H)
Acid-Base Excess: 3 mmol/L — ABNORMAL HIGH (ref 0.0–2.0)
Bicarbonate: 26.9 mmol/L (ref 20.0–28.0)
Calcium, Ion: 1.18 mmol/L (ref 1.15–1.40)
HCT: 25 % — ABNORMAL LOW (ref 39.0–52.0)
Hemoglobin: 8.5 g/dL — ABNORMAL LOW (ref 13.0–17.0)
O2 Saturation: 99 %
Patient temperature: 37.5
Potassium: 3.9 mmol/L (ref 3.5–5.1)
Sodium: 131 mmol/L — ABNORMAL LOW (ref 135–145)
TCO2: 28 mmol/L (ref 22–32)
pCO2 arterial: 39.8 mmHg (ref 32.0–48.0)
pH, Arterial: 7.439 (ref 7.350–7.450)
pO2, Arterial: 143 mmHg — ABNORMAL HIGH (ref 83.0–108.0)

## 2019-04-20 LAB — CBC
HCT: 26.5 % — ABNORMAL LOW (ref 39.0–52.0)
Hemoglobin: 8.8 g/dL — ABNORMAL LOW (ref 13.0–17.0)
MCH: 28.2 pg (ref 26.0–34.0)
MCHC: 33.2 g/dL (ref 30.0–36.0)
MCV: 84.9 fL (ref 80.0–100.0)
Platelets: 124 10*3/uL — ABNORMAL LOW (ref 150–400)
RBC: 3.12 MIL/uL — ABNORMAL LOW (ref 4.22–5.81)
RDW: 13.2 % (ref 11.5–15.5)
WBC: 14.9 10*3/uL — ABNORMAL HIGH (ref 4.0–10.5)
nRBC: 0 % (ref 0.0–0.2)

## 2019-04-20 LAB — DIGOXIN LEVEL: Digoxin Level: 0.3 ng/mL — ABNORMAL LOW (ref 0.8–2.0)

## 2019-04-20 LAB — HEPARIN LEVEL (UNFRACTIONATED)
Heparin Unfractionated: <0.1 IU/mL — ABNORMAL LOW (ref 0.30–0.70)
Heparin Unfractionated: <0.1 IU/mL — ABNORMAL LOW (ref 0.30–0.70)

## 2019-04-20 LAB — LACTATE DEHYDROGENASE: LDH: 269 U/L — ABNORMAL HIGH (ref 98–192)

## 2019-04-20 LAB — MAGNESIUM: Magnesium: 1.9 mg/dL (ref 1.7–2.4)

## 2019-04-20 MED ORDER — PRO-STAT SUGAR FREE PO LIQD
30.0000 mL | Freq: Two times a day (BID) | ORAL | Status: DC
  Administered 2019-04-20 – 2019-04-21 (×2): 30 mL via ORAL
  Filled 2019-04-20 (×2): qty 30

## 2019-04-20 MED ORDER — CHLORHEXIDINE GLUCONATE 0.12 % MT SOLN
15.0000 mL | Freq: Two times a day (BID) | OROMUCOSAL | Status: DC
  Administered 2019-04-20 – 2019-04-21 (×2): 15 mL via OROMUCOSAL
  Filled 2019-04-20 (×2): qty 15

## 2019-04-20 MED ORDER — GABAPENTIN 250 MG/5ML PO SOLN
100.0000 mg | Freq: Three times a day (TID) | ORAL | Status: AC
  Administered 2019-04-20 – 2019-04-21 (×3): 100 mg via ORAL
  Filled 2019-04-20 (×4): qty 2

## 2019-04-20 MED ORDER — DOCUSATE SODIUM 50 MG/5ML PO LIQD: 100.0000 mg | Freq: Two times a day (BID) | ORAL | Status: DC | PRN

## 2019-04-20 MED ORDER — SODIUM CHLORIDE 0.9 % IV SOLN
2.0000 g | INTRAVENOUS | Status: DC
  Administered 2019-04-20 – 2019-04-21 (×2): 2 g via INTRAVENOUS
  Filled 2019-04-20 (×2): qty 2
  Filled 2019-04-20: qty 20

## 2019-04-20 MED ORDER — ORAL CARE MOUTH RINSE: 15.0000 mL | Freq: Two times a day (BID) | OROMUCOSAL | Status: DC

## 2019-04-20 MED ORDER — VITAL 1.5 CAL PO LIQD
1000.0000 mL | ORAL | Status: DC
  Filled 2019-04-20 (×2): qty 1000

## 2019-04-20 MED ORDER — DEXMEDETOMIDINE HCL IN NACL 400 MCG/100ML IV SOLN
0.2000 ug/kg/h | INTRAVENOUS | Status: DC
  Filled 2019-04-20: qty 100

## 2019-04-20 MED ORDER — INSULIN ASPART 100 UNIT/ML ~~LOC~~ SOLN
0.0000 [IU] | SUBCUTANEOUS | Status: DC
  Administered 2019-04-20 – 2019-04-21 (×3): 4 [IU] via SUBCUTANEOUS
  Administered 2019-04-21: 12 [IU] via SUBCUTANEOUS
  Administered 2019-04-21: 2 [IU] via SUBCUTANEOUS
  Administered 2019-04-21 (×2): 8 [IU] via SUBCUTANEOUS
  Administered 2019-04-21 – 2019-04-22 (×3): 4 [IU] via SUBCUTANEOUS

## 2019-04-20 MED ORDER — ADULT MULTIVITAMIN W/MINERALS CH
1.0000 | ORAL_TABLET | Freq: Every day | ORAL | Status: DC
  Administered 2019-04-21: 1 via ORAL
  Filled 2019-04-20: qty 1

## 2019-04-20 MED ORDER — OXYCODONE HCL 5 MG/5ML PO SOLN
10.0000 mg | Freq: Four times a day (QID) | ORAL | Status: DC
  Administered 2019-04-20 – 2019-04-21 (×3): 10 mg via ORAL
  Filled 2019-04-20 (×3): qty 10

## 2019-04-20 MED ORDER — ACETAMINOPHEN 160 MG/5ML PO SOLN: 650.0000 mg | Freq: Four times a day (QID) | ORAL | Status: DC | PRN

## 2019-04-20 MED ORDER — HEPARIN (PORCINE) 25000 UT/250ML-% IV SOLN
750.0000 [IU]/h | INTRAVENOUS | Status: DC
  Administered 2019-04-21: 750 [IU]/h via INTRAVENOUS
  Filled 2019-04-20 (×2): qty 250

## 2019-04-20 MED ORDER — MAGNESIUM SULFATE IN D5W 1-5 GM/100ML-% IV SOLN
1.0000 g | Freq: Once | INTRAVENOUS | Status: AC
  Administered 2019-04-20: 1 g via INTRAVENOUS
  Filled 2019-04-20: qty 100

## 2019-04-20 NOTE — Progress Notes (Addendum)
OT Cancellation Note  Patient Details Name: Brendan [name] MRN: [id number] DOB: 02-22-1955   Cancelled Treatment:    Reason Eval/Treat Not Completed: Patient not medically ready RN asked to hold as pt just extubated, has impella and swan line. RN just sat pt EOB and noting increased fatigue. OT will follow up as pt available and appropriate.   Zenovia Jarred, MSOT, OTR/L Acute Rehabilitation Services [facility] Office Number: [phone] Pager: [phone]  Zenovia Jarred 04/20/2019, 2:36 PM

## 2019-04-20 NOTE — Progress Notes (Signed)
[place] for Heparin Indication: chest pain/ACS  Heparin Dosing Weight: 68.2 kg  Labs: Recent Labs    04/28/2019 2016 04/19/19 0047 04/19/19 0313 04/19/19 0408 04/19/19 0753 04/19/19 1635 04/20/19 0258 04/20/19 0309 04/20/19 0309 04/20/19 0440 04/20/19 0907  HGB 10.5*  --  10.1*   < >  --   --   --  8.9*   < > 8.5* 8.8*  HCT 31.7*  --  30.2*   < >  --   --   --  26.3*  --  25.0* 26.5*  PLT 210  --  190  --   --   --   --  131*  --   --  124*  APTT  --   --  46*  --   --   --   --   --   --   --   --   HEPARINUNFRC  --    < >  --    < > <0.10* <0.10* <0.10*  --   --   --   --   CREATININE 1.60*  --  1.34*  --   --   --   --  1.13  --   --   --    < > = values in this interval not displayed.    Assessment: 64 yo male presenting to [facility] with malaise and SOB for 4 days found to have elevated troponin and new depressed EF. Transferred to [facility] for further evaluation. Patient was started on heparin IV on 3/26, no anticoagulation PTA.   Patient now s/p cath 3/30 showing severe 3-vessel obstructive CAD, high LV filling pressures, reduced cardiac output, and moderate pulmonary HTN. After cath patient was transfered to the ICU and place PICC for IV milrinone, planning CABG,  Received impella 5.5 support on 4/2 - plan for CABG next week. Purge flow is 8.9 ml/hr (445 units/h) Heparin level undetecable systemic heparin stopped overnight after foley trauma and hematuria - now cleared.  CBC stable, no bleeding noted Spoke to MD - restart systemic heparin at noon    Goal of Therapy:  Heparin level target 0.2-0.25 per MD dicsussion Monitor platelets by anticoagulation protocol: Yes   Plan:  At noon resume systemic heparin drip500 units/hr  Continue purge heparin infusion Check heparin level in 6hr form restart  Monitor q12h HL when stable on heparin, and CBC, s/sx bleeding    Bonnita Nasuti Pharm.D. CPP, BCPS Clinical  Pharmacist [phone] 04/20/2019 11:58 AM  Please check AMION for all [place] numbers 04/20/2019

## 2019-04-20 NOTE — Progress Notes (Signed)
Patient UOP for this hour was zero, bladder scan showed 562 in bladder.  Dr. Haroldine Laws made aware- verbal order to advance foley   Foley advanced and patient voided 700 ml of clear yellow urine, bladder scan showed 0 ml of residual urine. About 5 minutes later urine became dark red with clots. Provider made aware  Verbal order from Dr.Bensimhon to stop systemic heparin and monitor for urinary occulusion

## 2019-04-20 NOTE — Progress Notes (Signed)
Pulmonary Interval Note  Patient evaluated post-extubation. Tolerating nasal cannula. PCCM will sign off. Please call [phone] pager for re-consult if needed.  Rodman Pickle, M.D. [facility] Pulmonary/Critical Care Medicine 04/20/2019 4:08 PM

## 2019-04-20 NOTE — Progress Notes (Signed)
eLink Physician-Brief Progress Note Patient Name: Brendan [name] DOB: [birth year] MRN: [id number]   Date of Service  04/20/2019  HPI/Events of Note  Request for restraints  eICU Interventions  Patient seen intubated on camera assessment, restraints ordered     Intervention Category Minor Interventions: Agitation / anxiety - evaluation and management  Judd Lien 04/20/2019, 2:18 AM

## 2019-04-20 NOTE — Procedures (Signed)
Extubation Procedure Note  Patient Details:   Name: Brendan [name] DOB: 06/12/1955 MRN: [id number]   Airway Documentation:    Vent end date: 04/20/19 Vent end time: 0830   Evaluation  O2 sats: stable throughout Complications: No apparent complications Patient did tolerate procedure well. Bilateral Breath Sounds: Clear, Diminished   Yes.  Pt was extubated per MD and placed on 3 L [place]. No cuff leak was noted prior to extubation and no stridor post. Pt is stable at this time. RT will monitor.   Ronaldo Miyamoto 04/20/2019, 8:31 AM

## 2019-04-20 NOTE — Progress Notes (Signed)
Advanced Heart Failure Rounding Note  PCP-Cardiologist: No primary care provider on file.   Subjective:    Events - Presented to [facility] with acute HF  EF 20-25% - Transferred to con - Cath 3/30 with severe 3 vessel disease. EF 25% - Developed PMVT on milrinone -> milrinone stopped -> developed worsening shock -> IABP placed on 4/1 - Clinical deterioration 4/1-> intubated - 4/2 underwent Impella 5.5 placement earlier and bilateral chest tubes with 3L out from each side.  - Extubated 4/4  Foley got pulled back last night and developed hematuria. Heparin held for 12 hours and I restarted midday. Urine now clear.   Extubated this am. Says he feels ok. Denies CP or SOB. CTs with mild drainage  Impella remains at P-8.  Flow 4.6 No alarms. Good waveforms. On milrinone 0.25 and NE 9 currently   Swan #s CVP 3 PA 22/5 Thermo 8.5/4.5 Co-ox 81%  Objective:   Weight Range: 67.3 kg Body mass index is 20.69 kg/m.   Vital Signs:   Temp:  [99.1 F (37.3 C)-101.1 F (38.4 C)] 99.5 F (37.5 C) (04/04 1700) Pulse Rate:  [64-83] 79 (04/04 1700) Resp:  [0-20] 16 (04/04 1130) BP: (91-120)/(53-80) 114/77 (04/04 1700) SpO2:  [85 %-100 %] 99 % (04/04 1700) Arterial Line BP: (101-131)/(49-68) 118/55 (04/04 1700) FiO2 (%):  [40 %] 40 % (04/04 0740) Weight:  [67.3 kg] 67.3 kg (04/04 0400) Last BM Date: 04/04/2019  Weight change: Filed Weights   04/26/2019 0630 04/19/19 0214 04/20/19 0400  Weight: 69.4 kg 64.7 kg 67.3 kg    Intake/Output:   Intake/Output Summary (Last 24 hours) at 04/20/2019 1729 Last data filed at 04/20/2019 1700 Gross per 24 hour  Intake 3962.45 ml  Output 3325 ml  Net 637.45 ml      Physical Exam   General:  In bed. NAD HEENT: normal Neck: supple. RIJ swan.  Carotids 2+ bilat; no bruits. No lymphadenopathy or thryomegaly appreciated.  Cor: R axillary impella site ok  PMI nondisplaced. Regular rate & rhythm. No rubs, gallops or murmurs. Lungs: clear +  bilateral CTs Abdomen: soft, nontender, nondistended. No hepatosplenomegaly. No bruits or masses. Good bowel sounds. Extremities: no cyanosis, clubbing, rash, edema Neuro: alert & orientedx3, cranial nerves grossly intact. moves all 4 extremities w/o difficulty. Affect pleasant   Telemetry   SR 80s Personally reviewed   Labs    CBC Recent Labs    04/19/19 0313 04/19/19 0408 04/20/19 0309 04/20/19 0309 04/20/19 0440 04/20/19 0907  WBC 17.1*   < > 14.0*  --   --  14.9*  NEUTROABS 13.7*  --  11.2*  --   --   --   HGB 10.1*   < > 8.9*   < > 8.5* 8.8*  HCT 30.2*   < > 26.3*   < > 25.0* 26.5*  MCV 85.3   < > 84.3  --   --  84.9  PLT 190   < > 131*  --   --  124*   < > = values in this interval not displayed.   Basic Metabolic Panel Recent Labs    04/19/19 0313 04/19/19 0408 04/19/19 1635 04/20/19 0309 04/20/19 0440  NA 130*   < >  --  131* 131*  K 4.2   < >  --  4.0 3.9  CL 93*  --   --  96*  --   CO2 27  --   --  26  --  GLUCOSE 178*  --   --  134*  --   BUN 35*  --   --  33*  --   CREATININE 1.34*  --   --  1.13  --   CALCIUM 8.1*  --   --  7.6*  --   MG 1.8   < > 1.9 1.9  --   PHOS 4.2  --  3.5  --   --    < > = values in this interval not displayed.   Liver Function Tests No results for input(s): AST, ALT, ALKPHOS, BILITOT, PROT, ALBUMIN in the last 72 hours. No results for input(s): LIPASE, AMYLASE in the last 72 hours. Cardiac Enzymes No results for input(s): CKTOTAL, CKMB, CKMBINDEX, TROPONINI in the last 72 hours.  BNP: BNP (last 3 results) Recent Labs    03/21/2019 0113  BNP 655.7*    ProBNP (last 3 results) No results for input(s): PROBNP in the last 8760 hours.   D-Dimer No results for input(s): DDIMER in the last 72 hours. Hemoglobin A1C No results for input(s): HGBA1C in the last 72 hours. Fasting Lipid Panel No results for input(s): CHOL, HDL, LDLCALC, TRIG, CHOLHDL, LDLDIRECT in the last 72 hours. Thyroid Function Tests No results for  input(s): TSH, T4TOTAL, T3FREE, THYROIDAB in the last 72 hours.  Invalid input(s): FREET3  Other results:   Imaging    No results found.   Medications:     Scheduled Medications: . aspirin EC  81 mg Oral Daily  . chlorhexidine gluconate (MEDLINE KIT)  15 mL Mouth Rinse BID  . Chlorhexidine Gluconate Cloth  6 each Topical Daily  . digoxin  0.125 mg Oral Daily  . feeding supplement (PRO-STAT SUGAR FREE 64)  30 mL Per Tube BID  . fentaNYL (SUBLIMAZE) injection  100 mcg Intravenous Once  . gabapentin  100 mg Per Tube Q8H  . insulin aspart  0-24 Units Subcutaneous Q4H  . mouth rinse  15 mL Mouth Rinse 10 times per day  . multivitamin with minerals  1 tablet Per Tube Daily  . oxyCODONE  10 mg Per Tube Q6H  . pantoprazole (PROTONIX) IV  40 mg Intravenous Q24H  . pneumococcal 23 valent vaccine  0.5 mL Intramuscular Tomorrow-1000  . rosuvastatin  40 mg Oral q1800  . sodium chloride flush  10-40 mL Intracatheter Q12H  . sodium chloride flush  3 mL Intravenous Q12H  . sodium chloride flush  3 mL Intravenous Q12H  . sodium chloride flush  3 mL Intravenous Q12H  . spironolactone  12.5 mg Oral Daily    Infusions: . sodium chloride    . sodium chloride    . amiodarone 30 mg/hr (04/20/19 1700)  . cefTRIAXone (ROCEPHIN)  IV Stopped (04/20/19 1131)  . dexmedetomidine (PRECEDEX) IV infusion    . feeding supplement (VITAL 1.5 CAL)    . impella catheter heparin 50 unit/mL in dextrose 5%    . heparin 500 Units/hr (04/20/19 1213)  . insulin Stopped (04/20/19 1208)  . lactated ringers 20 mL/hr at 04/20/19 1700  . milrinone 0.25 mcg/kg/min (04/20/19 1700)  . norepinephrine (LEVOPHED) Adult infusion 9.04 mcg/min (04/20/19 1729)    PRN Medications: sodium chloride, sodium chloride, acetaminophen, bisacodyl, docusate, fentaNYL, fentaNYL (SUBLIMAZE) injection, fentaNYL (SUBLIMAZE) injection, hydrALAZINE, midazolam, midazolam, nitroGLYCERIN, ondansetron (ZOFRAN) IV, ondansetron (ZOFRAN) IV,  sodium chloride flush, sodium chloride flush, sodium chloride flush, traMADol     Assessment/Plan    1.Acute systolic HF ->  Cardiogenic Shock  - Due to iCM -   Impella 5.5 placed on 4/2. Hemodynamics much improved. CO normalized. Can wean inotropes slowly starting with NE - Volume status looks good. CVP low. Off lasix. May actually have to give some fluid back to keep up with impella.  - Wean NE as tolerated. Continue milrinone - Continue digoxin and spiro - Plan high risk CABG/MVR on Tuesday with Impella support - Impella waveforms look good. Will turn down to P-6 LDH 269 - Heparin back on and stable. Discussed dosing with PharmD personally.  2. CAD - LHC with severe 3 vessel CAD  - No s/s angina.  - CABG Tuesday  3. Acute hypoxic respiratory failure - intubated 4/1. Extubated 4/4 - Respiratory status improved after drainage of bilateral effusions -  sats good on [place]  4. Mitral Regurgitation - Mod-severe on ECHO - Plan MVR at time of CABG - no change  5. Uncontrolled DM -Hgb A1C 9.  - On insulin and sliding scale.   6. AKI - due to shock - improved with hemodynamic support - creatinine 1.1 today  7. Acute blood loss Anemia  -transfuse as need to keep Hgb > 7.5  8. Polymorphic VT - Off lido. - Quiescent on amio -Keep K >4  -Keep  Mag >2   9. Neuropathy -Very limited with severe neuropathy. No sensation R and L foot and occasionaly in his hands. Requires assistance with ADLs.   10. Severe Malnutrition -Prealbumin 8.9  -Nutrition on board - Continue TFs  11. Leukocytosis - improved 17k -> 14k - on ceftriaxone - cultures NGTD  12. Hematuria  - due to Foley trauma. Resolved.    CRITICAL CARE Performed by: Bensimhon, Daniel  Total critical care time: 35 minutes  Critical care time was exclusive of separately billable procedures and treating other patients.  Critical care was necessary to treat or prevent imminent or life-threatening  deterioration.  Critical care was time spent personally by me (independent of midlevel providers or residents) on the following activities: development of treatment plan with patient and/or surrogate as well as nursing, discussions with consultants, evaluation of patient's response to treatment, examination of patient, obtaining history from patient or surrogate, ordering and performing treatments and interventions, ordering and review of laboratory studies, ordering and review of radiographic studies, pulse oximetry and re-evaluation of patient's condition.    Length of Stay: 6  Daniel Bensimhon, MD  04/20/2019, 5:29 PM  Advanced Heart Failure Team Pager [phone] (M-F; 7a - 4p)  Please contact [facility] for night-coverage after hours (4p -7a ) and weekends on [email or web address]    

## 2019-04-20 NOTE — Progress Notes (Signed)
PT Cancellation Note  Patient Details Name: Brendan [name] MRN: [id number] DOB: 1955-10-11   Cancelled Treatment:    Reason Eval/Treat Not Completed: Patient not medically ready. RN asked to hold as pt just extubated, has impella and swan line. RN just sat pt EOB and will do it later today. PT to return as able to complete eval.  Kittie Plater, PT, DPT Acute Rehabilitation Services Pager #: [phone] Office #: [phone]    Berline Lopes 04/20/2019, 12:40 PM

## 2019-04-20 NOTE — Progress Notes (Signed)
Anesthesiology Follow-up:  64 year old male two days S/P Impella insertion for heart failure due to ischemic cardiomyopathy and unstable angina.  Now awake and alert on vent, moving all extremities. Impella at P8 with 4.2-4.5L/min flows.  VS: T- 37.5 BP 108/73 HR- 68 (SR)  O2 Sat 100% PA 39/13 CVP-5 CO/CI 8.2/4.4  FiO2-0.4 TV- 585 RR-12 PEEP- 5 PIP- 20  PH 7.44 PCO2- 39.8 PO2- 143 K-3.9 BUN/CR- 33/1.13 H/H- 8.5/25.0 Platelets- 131,000  Impression: Patient much improved following Impella insertion 4/2. Looks he can be extubated. Plan CABG/MV repair or replacement on 4/6.  Roberts Gaudy

## 2019-04-20 NOTE — Progress Notes (Signed)
ANTICOAGULATION CONSULT NOTE  Pharmacy Consult for Heparin Indication: chest pain/ACS  Heparin Dosing Weight: 68.2 kg  Labs: Recent Labs    04/24/2019 2016 04/19/19 0047 04/19/19 0313 04/19/19 0408 04/19/19 1635 04/20/19 0258 04/20/19 0309 04/20/19 0309 04/20/19 0440 04/20/19 0907 04/20/19 1733  HGB 10.5*  --  10.1*   < >  --   --  8.9*   < > 8.5* 8.8*  --   HCT 31.7*  --  30.2*   < >  --   --  26.3*  --  25.0* 26.5*  --   PLT 210  --  190  --   --   --  131*  --   --  124*  --   APTT  --   --  46*  --   --   --   --   --   --   --   --   HEPARINUNFRC  --    < >  --    < > <0.10* <0.10*  --   --   --   --  <0.10*  CREATININE 1.60*  --  1.34*  --   --   --  1.13  --   --   --   --    < > = values in this interval not displayed.    Assessment: 64 yo male presenting to [facility] with malaise and SOB for 4 days found to have elevated troponin and new depressed EF. Transferred to [facility] for further evaluation. Patient was started on heparin IV on 3/26, no anticoagulation PTA.   Patient now s/p cath 3/30 showing severe 3-vessel obstructive CAD, high LV filling pressures, reduced cardiac output, and moderate pulmonary HTN. After cath patient was transfered to the ICU and place PICC for IV milrinone, planning CABG,  Received impella 5.5 support on 4/2 - plan for CABG next week.   Heparin level undetectable again, no bleeding in urine this shift.   Goal of Therapy:  Heparin level target 0.2-0.25 per MD dicsussion Monitor platelets by anticoagulation protocol: Yes   Plan:  Increase systemic heparin to 600 units/h Continue heparinized purge Recheck coags at [place], PharmD, BCPS Clinical Pharmacist [phone] Please check AMION for all [place] numbers 04/20/2019

## 2019-04-20 NOTE — Progress Notes (Addendum)
NAME:  Brendan [name], MRN:  [id number], DOB:  Apr 18, 1955, LOS: 6 ADMISSION DATE:  03/28/2019, CONSULTATION DATE:  04/20/2019 REFERRING MD:  Dr. Haroldine Laws, CHIEF COMPLAINT:  Respiratory distress  Brief History   64 yoM originally presented with SOB and fatigue at OHS found to have new HFrEF,  NSTEMI, and bilateral pleural effusions transferred to [facility] on 3/29 for further cardiac evaluation.  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  Started on lasix and milrinone gtts however developed NSVT.  Was taken 4/1 for placement of IABP and swan for optimization prior to CABG.  Was on precedex for agitation/ confusion, some concern for DTs.  On the evening of 4/1, patient developed worsening respiratory distress and hypoxia, PCCM consulted for intubation and vent management.   History of present illness   HPI obtained from medical chart review as patient is s/p emergent intubation and unable to provide history.    64 year old male with prior history of tobacco abuse, HTN, poorly controlled diabetes, and diabetic neuropathy who presented to [facility] on 3/26 with complaints of 4 days history of general malaise and shortness of breath.  He was found to have elevated troponin's, inferior wall EKG changes, and new systolic heart failure with EF 25%.  There was some concern for possible pneumonia vs pulmonary edema and was empirically on ceftriaxone and doxycycline, of which were since stopped given negative procalcitonin and no leukocytosis.  He was transferred to [facility] on 3/29 for further cardiac evaluation and consideration of cardiac catheterization.   At [facility], underwent left and right heart cath which showed severe 3 vessel obstructive CAD.  Repeat echo showed EF 30-35%, normal RV, and moderate to severe MR with CVP of 25 and LVEDP of 35 with CI 1.9- 2.0.  Heart failure team consulted and he was started on lasix gtt and milrinone gtts.  His shortness of breath did improve with diuresis.  TCTS were consulted for  CABG.  However, patient developed multiple PVCs and short runs of NSVT.  He was placed on amiodarone and milrinone held however coox worsened.  Milrinone was restarted and required the addition of lidocaine gtt for ongoing NSVT.   On 4/1, he was taken back for RHC and placement of IABP/ PA catheter to help optimize patient prior to CABG.  Throughout the day 4/1, he became increasing agitated despite precedex gtt.  Some concern for possible DTs.  He has intermittently required norepi for hemodynamic support and remained on 1:1 IABP support.  On the evening of 4/1, patient developed worsening hypoxia despite NRB and increased work of breathing this evening.  PCCM consulted for emergent intubation and mechanical ventilation management.   Past Medical History  Tobacco abuse, HTN, poorly controlled diabetes, diabetic neuropathy   Significant Hospital Events   3/26 presented to [facility] 3/29 tx to [facility]  3/30 [facility] 4/1  RHC/ IABP/ PA cath 4/2 Impella placement; bilateral CT placement  4/3 febrile over evening and night. Blood cultures obtained. Vanc/cefepime started. Hematuria--heparin held. 4/4 extubated. CABG planned for Tuesday. abx transitioned to rocephin. Heparin restarted  Consults:  TCTS PCCM HF  Procedures:  3/30 R/ LHC  4/1 RHC  4/1 IABP R groin >>4/2 4/1 R IJ sheath/ PA cath >> 4/1 Left radial art line >> 4/1 ETT >>4/4 4/2 Impella 4/2 Chest tube right>> 4/2 Chest tube left>>  Significant Diagnostic Tests:   3/30 R/ LHC >>  Ost LM to Mid LM lesion is 35% stenosed.  Prox LAD lesion is  90% stenosed.  Prox LAD to Mid LAD lesion is 50% stenosed.  Mid Cx lesion is 100% stenosed.  Prox RCA to Mid RCA lesion is 90% stenosed.  RPDA lesion is 95% stenosed.  LV end diastolic pressure is severely elevated.  Hemodynamic findings consistent with moderate pulmonary hypertension. 1. Severe 3 vessel obstructive CAD 2. High LV filling pressures 3. Reduced cardiac output  with index 2.3 4. Moderate pulmonary HTN.  3/30 TTE >> 1. Left ventricular ejection fraction, by estimation, is 30 to 35%. The left ventricle has moderately decreased function. The left ventricle demonstrates regional wall motion abnormalities.There is mild left ventricular hypertrophy. Left ventricular diastolic function could not be evaluated. There is severe hypokinesis of the left ventricular, entire inferior wall, inferoseptal wall, apical segment and lateral wall.   2. Right ventricular systolic function is hyperdynamic. The right ventricular size is normal. There is moderately elevated pulmonary artery systolic pressure.   3. Decreased posterior leaflet motion due to ischemic tethering of the mitral valve leaflets.   4. The mitral valve is abnormal. Moderate to severe mitral valve regurgitation.   5. The aortic valve is tricuspid. Aortic valve regurgitation is not visualized. Mild aortic valve sclerosis is present, with no evidence of aortic valve stenosis.   6. The inferior vena cava is normal in size with greater than 50% respiratory variability, suggesting right atrial pressure of 3 mmHg.   4/1 CT chest w/o >> Large bilateral pleural effusions with associated atelectasis. Moderate to severe bilateral ground-glass opacities, likely reflecting pneumonia and/or edema.   4/1 RHC  >> RA = 18 RV = 60/20  PA = 63/29 (42) PCW = 33 Fick cardiac output/index = 3.7/1.9 PVR = 2.5 WU FA sat = 98% PA sat = 47%, 49% PaPi = 2.2  4/2 CXR>bilateral chest tubes present. Significantly improved since morning xray. Infiltrate vs atelectasis at right base.  Micro Data:  3/30 MRSA PCR >> neg 4/1 MRSA PCR >> neg 4/2 resp culture>>rare gram pos cocci 4/4 blood cultures>>  Antimicrobials:  PTA ceftriaxone/ doxycycline >> 3/29 Cefepime 4/4>>4/4 vanc 4/4>>4/4 Rocephin 4/4>>  Interim history/subjective:  Hematuria overnight 2/2 foley displacement. Resolved this morning.  Febrile  overnight--cefepime/vanc started  Objective   Blood pressure (!) 120/53, pulse 71, temperature 99.7 F (37.6 C), resp. rate 20, height 5\' 11"  (1.803 m), weight 67.3 kg, SpO2 99 %. PAP: (16-41)/(3-15) 20/12 CVP:  [0 mmHg-76 mmHg] 3 mmHg CO:  [6.4 L/min-8.2 L/min] 7 L/min CI:  [3.4 L/min/m2-4.4 L/min/m2] 3.7 L/min/m2  Vent Mode: PSV;CPAP FiO2 (%):  [40 %] 40 % Set Rate:  [12 bmp] 12 bmp PEEP:  [5 cmH20] 5 cmH20 Pressure Support:  [5 cmH20-10 cmH20] 5 cmH20 Plateau Pressure:  [16 cmH20-18 cmH20] 16 cmH20   Intake/Output Summary (Last 24 hours) at 04/20/2019 0923 Last data filed at 04/20/2019 0800 Gross per 24 hour  Intake 4471.44 ml  Output 3245 ml  Net 1226.44 ml   Filed Weights   04/24/2019 0630 04/19/19 0214 04/20/19 0400  Weight: 69.4 kg 64.7 kg 67.3 kg   Examination: General:  Acute on chronically ill appearing; NAD HEENT: ETT out. Neuro: alert. Responds to questions appropriately. Follows commands. CV: RRR. Mechanical sounds. Bilateral chest tubes with minimal output. PULM:  Breathing comfortably on 4L [place]. Bibasilar crackles. GI: soft, bs+ GU: foley--non-bloody appearing output Extremities: warm/dry, no LE edema  Skin: no rashes   Resolved Hospital Problem list   AKI--likely 2/2 cardiorenal--improved with lasix  Assessment & Plan:   Acute respiratory insufficiency  on mechanical ventilation Tolerated SBT well--extubated this morning. Doing well on 4L [place].  -swallow eval Large bilateral pleural effusions -likely transudate 2/2 HFrEF and malnutrition. Bilateral chest tubes placed 4/2-200cc out overnight.  HFrEF. Cardiogenic shock. IABP placed 4/1--removed 4/2. impella placed 4/2. Decreasing vasopressor requirement.  NSTEMI/ CAD with severe 3 vessel disease. CABG planned for Tuesday. Severe mitral regurgitation. Will likely need MVR at time of CABG NSVT. On amio drip Management Per HF and TCTS  Fever. Febrile again overnight. vanc and cefepime started. Unclear source  for fever. No growth on blood cultures. Gram positive cocci on respiratory culture however no growth to date. Yesterday's CXR not overly convincing for infectious process.  -will de-escalate abx to rocephin -continue to follow cultures.   Acute kidney injury resolved. Hyponatremia. Na 130 Hematuria. Likely traumatic from foley displacement. No further hematuria evident this morning. Will continue to monitor.  Acute encephalopathy appears resolved.  Possible history ETOH use.  -will place on CIWA  -precedex prn  Poorly controlled diabetes. Continue insulin gtt  Post op blood loss anemia. hgb down 9.9>8.8 this morning. Transfuse for hgb< 8  Protein calorie malnutrition. Tube feeds d/c'd  Best practice:  Diet: npo. Pain/Anxiety/Delirium protocol (if indicated): CIWA VAP protocol (if indicated): n/a DVT prophylaxis: heparin gtt GI prophylaxis: PPI Glucose control: insulin ggt Mobility: BR Code Status: Full  Family Communication: per primary  Disposition: ICU   Labs   CBC: Recent Labs  Lab 05/10/2019 1830 04/27/2019 2127 04/28/2019 0348 05/13/2019 0820 04/29/2019 2016 04/19/19 0313 04/19/19 0408 04/20/19 0309 04/20/19 0440  WBC 11.2*  --  17.5*  --  17.2* 17.1*  --  14.0*  --   NEUTROABS  --   --   --   --   --  13.7*  --  11.2*  --   HGB 9.7*   < > 9.9*   < > 10.5* 10.1* 9.9* 8.9* 8.5*  HCT 29.3*   < > 30.4*   < > 31.7* 30.2* 29.0* 26.3* 25.0*  MCV 86.9  --  86.4  --  85.0 85.3  --  84.3  --   PLT 261  --  256  --  210 190  --  131*  --    < > = values in this interval not displayed.    Basic Metabolic Panel: Recent Labs  Lab 05/03/2019 1830 04/29/2019 2127 04/27/2019 0348 05/11/2019 0348 05/12/2019 0820 05/01/2019 0912 05/11/2019 0916 05/03/2019 1018 05/05/2019 1018 05/16/2019 1318 04/30/2019 1610 04/29/2019 2016 04/19/19 0313 04/19/19 0408 04/19/19 1635 04/20/19 0309 04/20/19 0440  NA 133*   < > 132*  --    < > 131*   < > 131*   < >  --   --  131* 130* 130*  --  131* 131*  K 4.9    < > 4.7  --    < > 3.8   < > 3.9   < >  --   --  4.5 4.2 4.3  --  4.0 3.9  CL 98   < > 95*  --    < > 93*  --  94*  --   --   --  93* 93*  --   --  96*  --   CO2 24  --  25  --   --   --   --   --   --   --   --  27 27  --   --  26  --  GLUCOSE 158*   < > 80  --    < > 107*  --  91  --   --   --  105* 178*  --   --  134*  --   BUN 45*   < > 45*  --    < > 37*  --  36*  --   --   --  38* 35*  --   --  33*  --   CREATININE 1.56*   < > 1.52*  --    < > 1.60*  --  1.70*  --   --   --  1.60* 1.34*  --   --  1.13  --   CALCIUM 9.0  --  9.1  --   --   --   --   --   --   --   --  8.2* 8.1*  --   --  7.6*  --   MG  --   --  2.1   < >  --   --   --   --   --  1.9 1.9  --  1.8  --  1.9 1.9  --   PHOS  --   --   --   --   --   --   --   --   --  4.1 4.4  --  4.2  --  3.5  --   --    < > = values in this interval not displayed.   GFR: Estimated Creatinine Clearance: 65.3 mL/min (by C-G formula based on SCr of 1.13 mg/dL). Recent Labs  Lab 04/24/2019 1830 05/15/2019 1830 04/24/2019 0035 04/23/2019 0348 04/28/2019 2016 04/19/19 0313 04/20/19 0309  WBC 11.2*   < >  --  17.5* 17.2* 17.1* 14.0*  LATICACIDVEN 2.2*  --  1.2  --   --   --   --    < > = values in this interval not displayed.    Liver Function Tests: Recent Labs  Lab 04/02/2019 2149 04/16/19 0448  AST 24 17  ALT 22 18  ALKPHOS 89 83  BILITOT 0.8 0.6  PROT 5.8* 5.3*  ALBUMIN 2.7* 2.5*   No results for input(s): LIPASE, AMYLASE in the last 168 hours. No results for input(s): AMMONIA in the last 168 hours.  ABG    Component Value Date/Time   PHART 7.439 04/20/2019 0440   PCO2ART 39.8 04/20/2019 0440   PO2ART 143.0 (H) 04/20/2019 0440   HCO3 26.9 04/20/2019 0440   TCO2 28 04/20/2019 0440   ACIDBASEDEF 3.0 (H) 04/26/2019 0853   ACIDBASEDEF 2.0 05/07/2019 0853   O2SAT 99.0 04/20/2019 0440     Coagulation Profile: Recent Labs  Lab 03/26/2019 2149  INR 1.3*    Cardiac Enzymes: No results for input(s): CKTOTAL, CKMB, CKMBINDEX,  TROPONINI in the last 168 hours.  HbA1C: HbA1c, POC (controlled diabetic range)  Date/Time Value Ref Range Status  01/14/2019 02:02 PM 12.5 (A) 0.0 - 7.0 % Final  08/21/2017 02:03 PM 8.5 (A) 0.0 - 7.0 % Final   Hgb A1c MFr Bld  Date/Time Value Ref Range Status  04/05/2019 09:49 PM 9.0 (H) 4.8 - 5.6 % Final    Comment:    (NOTE) Pre diabetes:          5.7%-6.4% Diabetes:              >6.4% Glycemic control for   <7.0% adults with diabetes  CBG: Recent Labs  Lab 04/20/19 0409 04/20/19 0438 04/20/19 0517 04/20/19 0611 04/20/19 0654  GLUCAP 83 147* 150* 140* 164*    Attending Attestation:  I have taken an interval history, reviewed the chart and examined the patient. I agree with the Advanced Practitioner's note, impression, and recommendations as outlined.   Briefly, 64 year old male active smoker with DM2 who was transferred to [facility] from OSH for NSTEMI with new onset heart failure (EF25%) found with 3vCAD and severe MR on 3/29. S/p IABP however had worsening shock overnight so exchanged for Impella on 4/2 with bilateral chest tube. Hospital course complicated hy hyperglycemia and NSVT. Planning for CABG and MVR next week. Arrived to ICU on vent and vasopressor support. PCCM consulted.  S: Febrile again with Tmax 101.1. Also had episode of hematuria/blood clots so heparin dc'd per Heart failure team. This morning urine was clear  Blood pressure 118/73, pulse 79, temperature 99.1 F (37.3 C), resp. rate 16, height 5\' 11"  (1.803 m), weight 67.3 kg, SpO2 100 %.  On exam, chronically ill appearing male on vent, follows commands, moves extremities x 4. Lungs CTAB, no wheezing. Cardiac RRR, mechanical murmur. No pedal edema, extremities warm to touch.  BCx 4/4 - pending Resp 4/2 - Rare GPC, pending  Assessment/Plan  Post-op respiratory insufficiency, pulmonary edema/pleural effusion- Tolerating SBT. Plan to extubate. Diuresis per HF.   Fever - Unclear source.  ?atelectasis. Occurred >24h from post-op. Continue empiric antibiotics. Follow-up cultures.  Cardiogenic shock in setting of 3vCAD with severe MR s/p impella placement - Weaned Levophed from 12 to 9 mcg/min. Continue vasopressor support and Impella per HF.  NSVT - No further episodes on telemetry. Off lidocaine. Continue amio gtt per HF  Hematuria - One point Hg drop from 9.9-->8.9. Will recheck CBC today and consider restarting heparin in setting of impella placement  AKI secondary ATN, cardiorenal syndrome- Monitor UOP/Cr. Diuresis per HF team  Hx EtOH abuse- Restart Precedex if agitated. CIWA protocol  The patient is critically ill with multiple organ systems failure and requires high complexity decision making for assessment and support, frequent evaluation and titration of therapies, application of advanced monitoring technologies and extensive interpretation of multiple databases.   Critical Care Time devoted to patient care services described in this note is 35 Minutes. This time reflects time of care of this signee Dr. Rodman Pickle. This critical care time does not reflect procedure time, or teaching time or supervisory time of PA/NP/Med student/Med Resident etc but could involve care discussion time.  Rodman Pickle, M.D. [facility] Pulmonary/Critical Care Medicine 04/20/2019 8:21 AM   Please see Amion for pager number to reach on-call Pulmonary and Critical Care Team.

## 2019-04-20 NOTE — Progress Notes (Signed)
Fentanyl IV 274ml wasted in stericycle witnessed by [facility] RN.

## 2019-04-21 ENCOUNTER — Inpatient Hospital Stay (HOSPITAL_COMMUNITY): Payer: Medicaid Other

## 2019-04-21 ENCOUNTER — Encounter: Payer: Self-pay | Admitting: *Deleted

## 2019-04-21 LAB — URINE CULTURE: Culture: NO GROWTH

## 2019-04-21 LAB — CBC WITH DIFFERENTIAL/PLATELET
Abs Immature Granulocytes: 0.09 10*3/uL — ABNORMAL HIGH (ref 0.00–0.07)
Basophils Absolute: 0 10*3/uL (ref 0.0–0.1)
Basophils Relative: 0 %
Eosinophils Absolute: 0.1 10*3/uL (ref 0.0–0.5)
Eosinophils Relative: 0 %
HCT: 26.9 % — ABNORMAL LOW (ref 39.0–52.0)
Hemoglobin: 9.1 g/dL — ABNORMAL LOW (ref 13.0–17.0)
Immature Granulocytes: 1 %
Lymphocytes Relative: 4 %
Lymphs Abs: 0.7 10*3/uL (ref 0.7–4.0)
MCH: 28.8 pg (ref 26.0–34.0)
MCHC: 33.8 g/dL (ref 30.0–36.0)
MCV: 85.1 fL (ref 80.0–100.0)
Monocytes Absolute: 1.8 10*3/uL — ABNORMAL HIGH (ref 0.1–1.0)
Monocytes Relative: 10 %
Neutro Abs: 15.7 10*3/uL — ABNORMAL HIGH (ref 1.7–7.7)
Neutrophils Relative %: 85 %
Platelets: 144 10*3/uL — ABNORMAL LOW (ref 150–400)
RBC: 3.16 MIL/uL — ABNORMAL LOW (ref 4.22–5.81)
RDW: 13.1 % (ref 11.5–15.5)
WBC: 18.4 10*3/uL — ABNORMAL HIGH (ref 4.0–10.5)
nRBC: 0 % (ref 0.0–0.2)

## 2019-04-21 LAB — BPAM RBC
Blood Product Expiration Date: 202104172359
Blood Product Expiration Date: 202104192359
Blood Product Expiration Date: 202105042359
Blood Product Expiration Date: 202105052359
Blood Product Expiration Date: 202105052359
ISSUE DATE / TIME: 202104021036
Unit Type and Rh: 5100
Unit Type and Rh: 5100
Unit Type and Rh: 5100
Unit Type and Rh: 9500
Unit Type and Rh: 9500

## 2019-04-21 LAB — GLUCOSE, CAPILLARY
Glucose-Capillary: 149 mg/dL — ABNORMAL HIGH (ref 70–99)
Glucose-Capillary: 171 mg/dL — ABNORMAL HIGH (ref 70–99)
Glucose-Capillary: 172 mg/dL — ABNORMAL HIGH (ref 70–99)
Glucose-Capillary: 240 mg/dL — ABNORMAL HIGH (ref 70–99)
Glucose-Capillary: 256 mg/dL — ABNORMAL HIGH (ref 70–99)
Glucose-Capillary: 190 mg/dL — ABNORMAL HIGH (ref 70–99)
Glucose-Capillary: 204 mg/dL — ABNORMAL HIGH (ref 70–99)

## 2019-04-21 LAB — TYPE AND SCREEN
ABO/RH(D): O NEG
Antibody Screen: NEGATIVE
Unit division: 0
Unit division: 0
Unit division: 0
Unit division: 0
Unit division: 0

## 2019-04-21 LAB — POCT I-STAT 7, (LYTES, BLD GAS, ICA,H+H)
Acid-Base Excess: 2 mmol/L (ref 0.0–2.0)
Bicarbonate: 25.8 mmol/L (ref 20.0–28.0)
Calcium, Ion: 1.15 mmol/L (ref 1.15–1.40)
HCT: 25 % — ABNORMAL LOW (ref 39.0–52.0)
Hemoglobin: 8.5 g/dL — ABNORMAL LOW (ref 13.0–17.0)
O2 Saturation: 92 %
Patient temperature: 36.7
Potassium: 3.7 mmol/L (ref 3.5–5.1)
Sodium: 131 mmol/L — ABNORMAL LOW (ref 135–145)
TCO2: 27 mmol/L (ref 22–32)
pCO2 arterial: 35.7 mmHg (ref 32.0–48.0)
pH, Arterial: 7.466 — ABNORMAL HIGH (ref 7.350–7.450)
pO2, Arterial: 59 mmHg — ABNORMAL LOW (ref 83.0–108.0)

## 2019-04-21 LAB — BASIC METABOLIC PANEL
Anion gap: 9 (ref 5–15)
BUN: 27 mg/dL — ABNORMAL HIGH (ref 8–23)
CO2: 27 mmol/L (ref 22–32)
Calcium: 7.7 mg/dL — ABNORMAL LOW (ref 8.9–10.3)
Chloride: 97 mmol/L — ABNORMAL LOW (ref 98–111)
Creatinine, Ser: 1.15 mg/dL (ref 0.61–1.24)
GFR calc Af Amer: >60 mL/min (ref >60)
GFR calc non Af Amer: >60 mL/min (ref >60)
Glucose, Bld: 150 mg/dL — ABNORMAL HIGH (ref 70–99)
Potassium: 3.6 mmol/L (ref 3.5–5.1)
Sodium: 133 mmol/L — ABNORMAL LOW (ref 135–145)

## 2019-04-21 LAB — SURGICAL PCR SCREEN
MRSA, PCR: NEGATIVE
Staphylococcus aureus: NEGATIVE

## 2019-04-21 LAB — COOXEMETRY PANEL
Carboxyhemoglobin: 1.7 % — ABNORMAL HIGH (ref 0.5–1.5)
Methemoglobin: 1 % (ref 0.0–1.5)
O2 Saturation: 74.4 %
Total hemoglobin: 8.9 g/dL — ABNORMAL LOW (ref 12.0–16.0)

## 2019-04-21 LAB — HEPATIC FUNCTION PANEL
ALT: 38 U/L (ref 0–44)
AST: 44 U/L — ABNORMAL HIGH (ref 15–41)
Albumin: 1.7 g/dL — ABNORMAL LOW (ref 3.5–5.0)
Alkaline Phosphatase: 88 U/L (ref 38–126)
Bilirubin, Direct: 0.2 mg/dL (ref 0.0–0.2)
Indirect Bilirubin: 0.4 mg/dL (ref 0.3–0.9)
Total Bilirubin: 0.6 mg/dL (ref 0.3–1.2)
Total Protein: 4.9 g/dL — ABNORMAL LOW (ref 6.5–8.1)

## 2019-04-21 LAB — CULTURE, RESPIRATORY W GRAM STAIN

## 2019-04-21 LAB — LACTATE DEHYDROGENASE: LDH: 267 U/L — ABNORMAL HIGH (ref 98–192)

## 2019-04-21 LAB — MAGNESIUM: Magnesium: 2.4 mg/dL (ref 1.7–2.4)

## 2019-04-21 LAB — HEPARIN LEVEL (UNFRACTIONATED)
Heparin Unfractionated: <0.1 IU/mL — ABNORMAL LOW (ref 0.30–0.70)
Heparin Unfractionated: 0.13 IU/mL — ABNORMAL LOW (ref 0.30–0.70)

## 2019-04-21 LAB — CALCIUM, IONIZED
Calcium, Ionized, Serum: 4.2 mg/dL — ABNORMAL LOW (ref 4.5–5.6)
Calcium, Ionized, Serum: 4.8 mg/dL (ref 4.5–5.6)

## 2019-04-21 MED ORDER — SODIUM CHLORIDE 0.9 % IV SOLN
INTRAVENOUS | Status: DC
  Filled 2019-04-21: qty 30

## 2019-04-21 MED ORDER — EPINEPHRINE HCL 5 MG/250ML IV SOLN IN NS
0.0000 ug/min | INTRAVENOUS | Status: AC
  Administered 2019-04-22: 1 ug/min via INTRAVENOUS
  Filled 2019-04-21: qty 250

## 2019-04-21 MED ORDER — TRANEXAMIC ACID 1000 MG/10ML IV SOLN
1.5000 mg/kg/h | INTRAVENOUS | Status: AC
  Administered 2019-04-22: 09:00:00 1.5 mg/kg/h via INTRAVENOUS
  Filled 2019-04-21: qty 25

## 2019-04-21 MED ORDER — VANCOMYCIN HCL 1250 MG/250ML IV SOLN
1250.0000 mg | INTRAVENOUS | Status: AC
  Administered 2019-04-22: 1250 mg via INTRAVENOUS
  Filled 2019-04-21: qty 250

## 2019-04-21 MED ORDER — POTASSIUM CHLORIDE 10 MEQ/50ML IV SOLN
10.0000 meq | INTRAVENOUS | Status: AC
  Administered 2019-04-21 (×2): 10 meq via INTRAVENOUS
  Filled 2019-04-21 (×2): qty 50

## 2019-04-21 MED ORDER — DEXMEDETOMIDINE HCL IN NACL 400 MCG/100ML IV SOLN
0.1000 ug/kg/h | INTRAVENOUS | Status: AC
  Administered 2019-04-22: 14:00:00 .4 ug/kg/h via INTRAVENOUS
  Filled 2019-04-21: qty 100

## 2019-04-21 MED ORDER — PHENYLEPHRINE HCL-NACL 20-0.9 MG/250ML-% IV SOLN
30.0000 ug/min | INTRAVENOUS | Status: AC
  Administered 2019-04-22: 20 ug/min via INTRAVENOUS
  Filled 2019-04-21: qty 250

## 2019-04-21 MED ORDER — ALPRAZOLAM 0.25 MG PO TABS
0.2500 mg | ORAL_TABLET | ORAL | Status: DC | PRN
  Administered 2019-04-21 – 2019-04-22 (×2): 0.5 mg via ORAL
  Filled 2019-04-21 (×2): qty 2

## 2019-04-21 MED ORDER — MAGNESIUM SULFATE 50 % IJ SOLN
40.0000 meq | INTRAMUSCULAR | Status: DC
  Filled 2019-04-21: qty 9.85

## 2019-04-21 MED ORDER — ALBUMIN HUMAN 25 % IV SOLN
12.5000 g | Freq: Once | INTRAVENOUS | Status: AC
  Administered 2019-04-21: 12.5 g via INTRAVENOUS
  Filled 2019-04-21: qty 50

## 2019-04-21 MED ORDER — ALBUMIN HUMAN 25 % IV SOLN
12.5000 g | Freq: Four times a day (QID) | INTRAVENOUS | Status: AC
  Administered 2019-04-21 – 2019-04-22 (×4): 12.5 g via INTRAVENOUS
  Filled 2019-04-21 (×4): qty 50

## 2019-04-21 MED ORDER — TEMAZEPAM 15 MG PO CAPS
15.0000 mg | ORAL_CAPSULE | Freq: Once | ORAL | Status: AC | PRN
  Administered 2019-04-21: 15 mg via ORAL
  Filled 2019-04-21: qty 1

## 2019-04-21 MED ORDER — TRANEXAMIC ACID (OHS) BOLUS VIA INFUSION
15.0000 mg/kg | INTRAVENOUS | Status: AC
  Administered 2019-04-22: 09:00:00 1015.5 mg via INTRAVENOUS
  Filled 2019-04-21: qty 1016

## 2019-04-21 MED ORDER — INSULIN REGULAR(HUMAN) IN NACL 100-0.9 UT/100ML-% IV SOLN
INTRAVENOUS | Status: AC
  Administered 2019-04-22: 09:00:00 1 [IU]/h via INTRAVENOUS
  Filled 2019-04-21: qty 100

## 2019-04-21 MED ORDER — PLASMA-LYTE 148 IV SOLN
INTRAVENOUS | Status: AC
  Administered 2019-04-22: 500 mL
  Filled 2019-04-21: qty 2.5

## 2019-04-21 MED ORDER — NOREPINEPHRINE 4 MG/250ML-% IV SOLN
0.0000 ug/min | INTRAVENOUS | Status: AC
  Administered 2019-04-22: 4 ug/min via INTRAVENOUS
  Filled 2019-04-21: qty 250

## 2019-04-21 MED ORDER — GABAPENTIN 100 MG PO CAPS
100.0000 mg | ORAL_CAPSULE | Freq: Three times a day (TID) | ORAL | Status: DC
  Administered 2019-04-21: 100 mg via ORAL
  Filled 2019-04-21: qty 1

## 2019-04-21 MED ORDER — BISACODYL 5 MG PO TBEC: 5.0000 mg | DELAYED_RELEASE_TABLET | Freq: Once | ORAL | Status: DC

## 2019-04-21 MED ORDER — PANTOPRAZOLE SODIUM 40 MG PO TBEC
40.0000 mg | DELAYED_RELEASE_TABLET | Freq: Every day | ORAL | Status: DC
  Administered 2019-04-21: 40 mg via ORAL
  Filled 2019-04-21: qty 1

## 2019-04-21 MED ORDER — MILRINONE LACTATE IN DEXTROSE 20-5 MG/100ML-% IV SOLN
0.3000 ug/kg/min | INTRAVENOUS | Status: AC
  Administered 2019-04-22: .25 ug/kg/min via INTRAVENOUS
  Filled 2019-04-21: qty 100

## 2019-04-21 MED ORDER — MIDAZOLAM HCL 2 MG/2ML IJ SOLN: 1.0000 mg | INTRAMUSCULAR | Status: DC | PRN

## 2019-04-21 MED ORDER — OXYCODONE HCL 5 MG PO TABS
10.0000 mg | ORAL_TABLET | Freq: Four times a day (QID) | ORAL | Status: DC
  Administered 2019-04-21 – 2019-04-22 (×3): 10 mg via ORAL
  Filled 2019-04-21 (×3): qty 2

## 2019-04-21 MED ORDER — CHLORHEXIDINE GLUCONATE 0.12 % MT SOLN
15.0000 mL | Freq: Once | OROMUCOSAL | Status: AC
  Administered 2019-04-22: 15 mL via OROMUCOSAL
  Filled 2019-04-21: qty 15

## 2019-04-21 MED ORDER — NITROGLYCERIN IN D5W 200-5 MCG/ML-% IV SOLN
2.0000 ug/min | INTRAVENOUS | Status: DC
  Filled 2019-04-21: qty 250

## 2019-04-21 MED ORDER — SODIUM CHLORIDE 0.9 % IV SOLN
750.0000 mg | INTRAVENOUS | Status: DC
  Filled 2019-04-21: qty 750

## 2019-04-21 MED ORDER — TRANEXAMIC ACID (OHS) PUMP PRIME SOLUTION
2.0000 mg/kg | INTRAVENOUS | Status: DC
  Filled 2019-04-21: qty 1.35

## 2019-04-21 MED ORDER — POTASSIUM CHLORIDE 2 MEQ/ML IV SOLN
80.0000 meq | INTRAVENOUS | Status: DC
  Filled 2019-04-21: qty 40

## 2019-04-21 MED ORDER — SODIUM CHLORIDE 0.9 % IV SOLN
1.5000 g | INTRAVENOUS | Status: AC
  Administered 2019-04-22: 1.5 g via INTRAVENOUS
  Filled 2019-04-21: qty 1.5

## 2019-04-21 MED ORDER — CHLORHEXIDINE GLUCONATE CLOTH 2 % EX PADS
6.0000 | MEDICATED_PAD | Freq: Once | CUTANEOUS | Status: AC
  Administered 2019-04-21 – 2019-04-22 (×2): 6 via TOPICAL

## 2019-04-21 MED FILL — Magnesium Sulfate Inj 50%: INTRAMUSCULAR | Qty: 10 | Status: AC

## 2019-04-21 MED FILL — Potassium Chloride Inj 2 mEq/ML: INTRAVENOUS | Qty: 40 | Status: AC

## 2019-04-21 MED FILL — Heparin Sodium (Porcine) Inj 1000 Unit/ML: INTRAMUSCULAR | Qty: 30 | Status: AC

## 2019-04-21 MED FILL — Heparin Sodium (Porcine) Inj 1000 Unit/ML: INTRAMUSCULAR | Qty: 2500 | Status: AC

## 2019-04-21 MED FILL — Cefuroxime Sodium For IV Soln 1.5 GM: INTRAVENOUS | Qty: 1.5 | Status: AC

## 2019-04-21 NOTE — Progress Notes (Signed)
TCTS DAILY ICU PROGRESS NOTE                   [address]            [place] [address]          [phone]   3 Days Post-Op Procedure(s) (LRB): Placement of Impella 5.5 Direct (Right) TRANSESOPHAGEAL ECHOCARDIOGRAM (TEE) (N/A)  Total Length of Stay:  LOS: 7 days   Subjective: Patient denies chest pain and states breathing is pretty good.  Objective: Vital signs in last 24 hours: Temp:  [97.9 F (36.6 C)-99.9 F (37.7 C)] 97.9 F (36.6 C) (04/05 0600) Pulse Rate:  [66-94] 73 (04/05 0600) Cardiac Rhythm: Normal sinus rhythm (04/05 0400) Resp:  [9-23] 16 (04/05 0600) BP: (91-120)/(53-80) 107/64 (04/05 0600) SpO2:  [91 %-100 %] 95 % (04/05 0600) Arterial Line BP: (101-134)/(48-64) 115/50 (04/05 0600) FiO2 (%):  [40 %] 40 % (04/04 0740) Weight:  [67.7 kg] 67.7 kg (04/05 0600)  Filed Weights   04/19/19 0214 04/20/19 0400 04/21/19 0600  Weight: 64.7 kg 67.3 kg 67.7 kg    Weight change: 0.4 kg   Hemodynamic parameters for last 24 hours: PAP: (16-30)/(0-12) 29/8 CVP:  [0 mmHg-13 mmHg] 1 mmHg CO:  [7 L/min-9 L/min] 7.4 L/min CI:  [3.7 L/min/m2-4.8 L/min/m2] 4 L/min/m2  Intake/Output from previous day: 04/04 0701 - 04/05 0700 In: 2271.1 [I.V.:1888.6; IV Piggyback:176.5] Out: 2250 [Urine:1910; Chest Tube:340]  Intake/Output this shift: No intake/output data recorded.  Current Meds: Scheduled Meds: . aspirin EC  81 mg Oral Daily  . chlorhexidine  15 mL Mouth Rinse BID  . Chlorhexidine Gluconate Cloth  6 each Topical Daily  . digoxin  0.125 mg Oral Daily  . feeding supplement (PRO-STAT SUGAR FREE 64)  30 mL Oral BID  . fentaNYL (SUBLIMAZE) injection  100 mcg Intravenous Once  . gabapentin  100 mg Oral Q8H  . insulin aspart  0-24 Units Subcutaneous Q4H  . mouth rinse  15 mL Mouth Rinse q12n4p  . multivitamin with minerals  1 tablet Oral Daily  . oxyCODONE  10 mg Oral Q6H  . pantoprazole (PROTONIX) IV  40 mg Intravenous Q24H  . pneumococcal 23  valent vaccine  0.5 mL Intramuscular Tomorrow-1000  . rosuvastatin  40 mg Oral q1800  . sodium chloride flush  10-40 mL Intracatheter Q12H  . spironolactone  12.5 mg Oral Daily   Continuous Infusions: . amiodarone 30 mg/hr (04/21/19 0700)  . cefTRIAXone (ROCEPHIN)  IV Stopped (04/20/19 1131)  . dexmedetomidine (PRECEDEX) IV infusion    . feeding supplement (VITAL 1.5 CAL)    . impella catheter heparin 50 unit/mL in dextrose 5%    . heparin 750 Units/hr (04/21/19 0700)  . insulin Stopped (04/20/19 1208)  . lactated ringers 20 mL/hr at 04/21/19 0700  . milrinone 0.25 mcg/kg/min (04/21/19 0700)  . norepinephrine (LEVOPHED) Adult infusion 4 mcg/min (04/21/19 0700)   PRN Meds:.acetaminophen, bisacodyl, docusate, fentaNYL, fentaNYL (SUBLIMAZE) injection, fentaNYL (SUBLIMAZE) injection, hydrALAZINE, midazolam, midazolam, nitroGLYCERIN, ondansetron (ZOFRAN) IV, ondansetron (ZOFRAN) IV, sodium chloride flush, traMADol  General appearance: alert, cooperative and no distress Neurologic: intact Heart: RRR Lungs: Clear to ascultation bilaterally Abdomen: Soft, non tender, sporadic bowel sounds present Extremities: No LE edema   Lab Results: CBC: Recent Labs    04/20/19 0907 04/20/19 0907 04/21/19 0344 04/21/19 0504  WBC 14.9*  --  18.4*  --   HGB 8.8*   < > 9.1* 8.5*  HCT 26.5*   < >  26.9* 25.0*  PLT 124*  --  144*  --    < > = values in this interval not displayed.   BMET:  Recent Labs    04/19/19 0313 04/19/19 0408 04/20/19 0309 04/20/19 0309 04/20/19 0440 04/21/19 0504  NA 130*   < > 131*   < > 131* 131*  K 4.2   < > 4.0   < > 3.9 3.7  CL 93*  --  96*  --   --   --   CO2 27  --  26  --   --   --   GLUCOSE 178*  --  134*  --   --   --   BUN 35*  --  33*  --   --   --   CREATININE 1.34*  --  1.13  --   --   --   CALCIUM 8.1*  --  7.6*  --   --   --    < > = values in this interval not displayed.    CMET: Lab Results  Component Value Date   WBC 18.4 (H) 04/21/2019    HGB 8.5 (L) 04/21/2019   HCT 25.0 (L) 04/21/2019   PLT 144 (L) 04/21/2019   GLUCOSE 134 (H) 04/20/2019   CHOL 174 08/21/2017   TRIG 143 08/21/2017   HDL 57 08/21/2017   LDLCALC 88 08/21/2017   ALT 18 04/16/2019   AST 17 04/16/2019   NA 131 (L) 04/21/2019   K 3.7 04/21/2019   CL 96 (L) 04/20/2019   CREATININE 1.13 04/20/2019   BUN 33 (H) 04/20/2019   CO2 26 04/20/2019   TSH 0.925 03/19/2019   INR 1.3 (H) 03/27/2019   HGBA1C 9.0 (H) 03/29/2019   MICROALBUR 30 08/21/2017      PT/INR: No results for input(s): LABPROT, INR in the last 72 hours. Radiology: No results found.   Assessment/Plan: S/P Procedure(s) (LRB): Placement of Impella 5.5 Direct (Right) TRANSESOPHAGEAL ECHOCARDIOGRAM (TEE) (N/A)   1. CV-Cardiomyopathy with LVEF 20-25%. S/p Impella 04/02. He has 3 vessel CAD and moderate to severe MR, CABG/MVR  in am if all in agreement. SR with HR in the 70's this am. On Digoxin 0.125 mg daily. Also, on Heaprin, Milrinone 0.25, Nor epinephrine drips. Co ox this am 74.4 2. Pulmonary-Previous acute hypoxic respiratory failure. Extubated 04/04. s/p bilateral chest tubes for pleural effusions 04/02. On 3 liters of oxygen via [place]. CXR this am appears stable. 3. Anemia-H and H this am stable at 9.1 and 26.9 4. Mild thrombocytopenia-platelets this am 124,000 5. ID-On Ceftriaxone. Leukocytosis-WBC increased to 18,400.    Shavonna Corella Liston Alba PA-C 04/21/2019 7:23 AM

## 2019-04-21 NOTE — Anesthesia Preprocedure Evaluation (Addendum)
Anesthesia Evaluation  Patient identified by MRN, date of birth, ID band Patient awake    Reviewed: Allergy & Precautions, NPO status , Patient's Chart, lab work & pertinent test results  History of Anesthesia Complications (+) PONV and history of anesthetic complications  Airway Mallampati: II  TM Distance: >3 FB Neck ROM: Full    Dental  (+) Edentulous Upper, Edentulous Lower   Pulmonary neg pulmonary ROS,     (-) decreased breath sounds      Cardiovascular hypertension, + CAD, + Past MI and +CHF  Normal cardiovascular exam Rhythm:Regular Rate:Normal     Neuro/Psych negative neurological ROS     GI/Hepatic negative GI ROS, Neg liver ROS,   Endo/Other  diabetes  Renal/GU negative Renal ROS     Musculoskeletal   Abdominal   Peds  Hematology   Anesthesia Other Findings   Reproductive/Obstetrics                            Anesthesia Physical  Anesthesia Plan  ASA: IV  Anesthesia Plan: General   Post-op Pain Management:    Induction: Intravenous  PONV Risk Score and Plan: 3 and Ondansetron, Midazolam and Treatment may vary due to age or medical condition  Airway Management Planned: Oral ETT  Additional Equipment: 3D TEE, Arterial line, PA Cath and Ultrasound Guidance Line Placement  Intra-op Plan:   Post-operative Plan: Post-operative intubation/ventilation  Informed Consent: I have reviewed the patients History and Physical, chart, labs and discussed the procedure including the risks, benefits and alternatives for the proposed anesthesia with the patient or authorized representative who has indicated his/her understanding and acceptance.     Dental advisory given  Plan Discussed with: CRNA and Anesthesiologist  Anesthesia Plan Comments:        Anesthesia Quick Evaluation

## 2019-04-21 NOTE — Evaluation (Signed)
Physical Therapy Evaluation Patient Details Name: Brendan [name] MRN: [id number] DOB: December 12, 1955 Today's Date: 04/21/2019   History of Present Illness  54 yoM with PMH of Tobacco abuse, HTN, poorly controlled diabetes, diabetic neuropathy originally presented with SOB and fatigue at [facility] found to have new HFrEF,  NSTEMI, and bilateral pleural effusions transferred to [facility] on 3/29 for further cardiac evaluation.  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  Started on lasix and milrinone gtts however developed NSVT.  Was taken 4/1 for placement of IABP and swan for optimization prior to CABG.  Was on precedex for agitation/ confusion, some concern for DTs.  On the evening of 4/1, patient developed worsening respiratory distress and hypoxia, head R/L heart cath on 3/30 & R heart cath on 4/1 pt intubated on 4/1, extubated 4/4, had R & L chest tubes placed 4/2 due to bilateral pleural effusions, removed 4/5 and impella device placed 4/2.  CABG planned for 04/29/2019.  Clinical Impression  Patient presents with mobility limited due to decreased balance with history of neuropathy, decreased activity tolerance, decreased strength.  Note plans for CABG tomorrow.  PT to follow up post surgery for addressing deficits and progressing mobility as tolerated to allow d/c home with family support and likely follow up HHPT.     Follow Up Recommendations Home health PT;Supervision/Assistance - 24 hour    Equipment Recommendations  Other (comment)(has RW & cane, will decide on 3:1)    Recommendations for Other Services       Precautions / Restrictions Precautions Precautions: Fall Precaution Comments: watch R arm no elevation above 90 with impella device Restrictions Weight Bearing Restrictions: Yes Other Position/Activity Restrictions: reports one fall PTA states tripped on porch, noted scrape on L knee      Mobility  Bed Mobility Overal bed mobility: Needs Assistance Bed Mobility: Supine to Sit     Supine to  sit: Min assist;HOB elevated     General bed mobility comments: reached for my hand to help pull up with L arm  Transfers Overall transfer level: Needs assistance Equipment used: 1 person hand held assist Transfers: Sit to/from [facility] Sit to Stand: Mod assist Stand pivot transfers: Min assist       General transfer comment: some lifting help to stand, then stand step to chair only due to impella device with min A For balance with HHA and several others to help with multiple lines  Ambulation/Gait             General Gait Details: deferred due to impella  Stairs            Wheelchair Mobility    Modified Rankin (Stroke Patients Only)       Balance Overall balance assessment: Needs assistance   Sitting balance-Leahy Scale: Fair Sitting balance - Comments: seated EOB without UE support   Standing balance support: Single extremity supported Standing balance-Leahy Scale: Poor Standing balance comment: HHA in standing with multiple lines no c/o dizziness                             Pertinent Vitals/Pain Pain Assessment: Faces Faces Pain Scale: Hurts little more Pain Location: R arm with movement Pain Descriptors / Indicators: Grimacing;Sore Pain Intervention(s): Monitored during session;Repositioned;Limited activity within patient's tolerance    Home Living Family/patient expects to be discharged to:: Private residence Living Arrangements: Spouse/significant other Available Help at Discharge: Family Type of Home: Apartment Home Access: Level  entry     Home Layout: One level Home Equipment: [place] - 2 wheels;Cane - single point      Prior Function Level of Independence: Independent         Comments: wife did all IADL's, except pt was able to drive     Hand Dominance        Extremity/Trunk Assessment   Upper Extremity Assessment Upper Extremity Assessment: Defer to OT evaluation    Lower Extremity  Assessment Lower Extremity Assessment: LLE deficits/detail;RLE deficits/detail RLE Deficits / Details: AROM WFL, strength grossly 4+/5, except ankle DF 4-/5 RLE Sensation: history of peripheral neuropathy;decreased light touch(numbness in foot and lower leg) LLE Deficits / Details: AROM WFL, strength hip flexion 4-/5, ankle DF 4/5 LLE Sensation: history of peripheral neuropathy;decreased light touch(numbness in foot and lower leg)       Communication   Communication: No difficulties  Cognition Arousal/Alertness: Awake/alert Behavior During Therapy: WFL for tasks assessed/performed Overall Cognitive Status: Impaired/Different from baseline Area of Impairment: Orientation                 Orientation Level: Time             General Comments: stated date was 03/22/19, but when corrected states it is April, otherwise [facility]      General Comments General comments (skin integrity, edema, etc.): VSS, 3 nurses in the room for managing lines.    Exercises     Assessment/Plan    PT Assessment Patient needs continued PT services  PT Problem List Decreased strength;Decreased activity tolerance;Decreased mobility;Decreased balance;Decreased knowledge of use of DME;Decreased knowledge of precautions;Decreased safety awareness;Cardiopulmonary status limiting activity       PT Treatment Interventions DME instruction;Therapeutic activities;Balance training;Patient/family education;Therapeutic exercise;Functional mobility training;Gait training    PT Goals (Current goals can be found in the Care Plan section)  Acute Rehab PT Goals Patient Stated Goal: to return to independent PT Goal Formulation: With patient Time For Goal Achievement: 05/05/19 Potential to Achieve Goals: Good    Frequency Min 3X/week   Barriers to discharge        Co-evaluation PT/OT/SLP Co-Evaluation/Treatment: Yes Reason for Co-Treatment: Complexity of the patient's impairments (multi-system involvement);For  patient/therapist safety;To address functional/ADL transfers PT goals addressed during session: Mobility/safety with mobility         AM-PAC PT "6 Clicks" Mobility  Outcome Measure Help needed turning from your back to your side while in a flat bed without using bedrails?: A Little Help needed moving from lying on your back to sitting on the side of a flat bed without using bedrails?: A Little Help needed moving to and from a bed to a chair (including a wheelchair)?: A Little Help needed standing up from a chair using your arms (e.g., wheelchair or bedside chair)?: A Little Help needed to walk in hospital room?: Total Help needed climbing 3-5 steps with a railing? : Total 6 Click Score: 14    End of Session Equipment Utilized During Treatment: Oxygen Activity Tolerance: Patient tolerated treatment well Patient left: in chair;with call bell/phone within reach;with nursing/sitter in room   PT Visit Diagnosis: Muscle weakness (generalized) (M62.81);Other abnormalities of gait and mobility (R26.89)    Time: 5638-7564 PT Time Calculation (min) (ACUTE ONLY): 23 min   Charges:   PT Evaluation $PT Eval Moderate Complexity: [place], [place] Acute Rehabilitation Services [phone] 04/21/2019   [name] 04/21/2019, 10:57 AM

## 2019-04-21 NOTE — Progress Notes (Signed)
SLP Cancellation Note  Patient Details Name: Brendan [name] MRN: [id number] DOB: 1955-11-03   Cancelled evaluation:  Pt is on a HH diet.  Spoke with RN, who reports he is doing fine with no concerns for dysphagia.  Our service will sign off. Please reorder swallow assessment if warranted.  Willowdean Luhmann L. Tivis Ringer, [place] Office number [phone] Pager [phone]          Juan Quam Laurice 04/21/2019, 9:36 AM

## 2019-04-21 NOTE — Progress Notes (Signed)
Advanced Heart Failure Rounding Note  PCP-Cardiologist: No primary care provider on file.   Subjective:    Events - Presented to [facility] with acute HF  EF 20-25% - Transferred to con - Cath 3/30 with severe 3 vessel disease. EF 25% - Developed PMVT on milrinone -> milrinone stopped -> developed worsening shock -> IABP placed on 4/1 - Clinical deterioration 4/1-> intubated - 4/2 underwent Impella 5.5 placement earlier and bilateral chest tubes with 3L out from each side.  - Extubated 4/4  Remains on Impella 5.5 at P-6 Flow 3.6  Waveforms ok   Feels ok. Was able to stand with PT. Still some drainage out of right chest tube but it is minimal.  No significant drainage on the left.  His chest pain or shortness of breath.  On heparin with an undetectable level.  No evidence of obvious bleeding.  Creatinine is stable.  Swan #s CVP 1-2 PA 29/8 Thermo 7.4/4.0 Co-ox 74%  Objective:   Weight Range: 67.7 kg Body mass index is 20.82 kg/m.   Vital Signs:   Temp:  [97.3 F (36.3 C)-99.9 F (37.7 C)] 97.3 F (36.3 C) (04/05 0800) Pulse Rate:  [69-94] 75 (04/05 0800) Resp:  [9-23] 23 (04/05 0800) BP: (91-120)/(53-80) 106/69 (04/05 0800) SpO2:  [91 %-100 %] 98 % (04/05 0800) Arterial Line BP: (101-134)/(47-64) 114/50 (04/05 0800) Weight:  [67.7 kg] 67.7 kg (04/05 0600) Last BM Date: 03/20/2019  Weight change: Filed Weights   04/19/19 0214 04/20/19 0400 04/21/19 0600  Weight: 64.7 kg 67.3 kg 67.7 kg    Intake/Output:   Intake/Output Summary (Last 24 hours) at 04/21/2019 0824 Last data filed at 04/21/2019 0800 Gross per 24 hour  Intake 2224.78 ml  Output 2270 ml  Net -45.22 ml      Physical Exam   General:  Lying in bed No resp difficulty HEENT: normal Neck: supple.  Right IJ Swan carotids 2+ bilat; no bruits. No lymphadenopathy or thryomegaly appreciated. Cor: PMI nondisplaced. Regular rate & rhythm. No rubs, gallops or murmurs.  + Impella hum.  Axillary Impella site  looks good. Lungs: clear with mildly decreased breath sounds on the left base Abdomen: soft, nontender, nondistended. No hepatosplenomegaly. No bruits or masses. Good bowel sounds. Extremities: no cyanosis, clubbing, rash, edema Coley catheter in place Neuro: alert & orientedx3, cranial nerves grossly intact. moves all 4 extremities w/o difficulty. Affect pleasant    Telemetry   SR 70-80s Personally reviewed  Labs    CBC Recent Labs    04/20/19 0309 04/20/19 0440 04/20/19 0907 04/20/19 0907 04/21/19 0344 04/21/19 0504  WBC 14.0*   < > 14.9*  --  18.4*  --   NEUTROABS 11.2*  --   --   --  15.7*  --   HGB 8.9*   < > 8.8*   < > 9.1* 8.5*  HCT 26.3*   < > 26.5*   < > 26.9* 25.0*  MCV 84.3   < > 84.9  --  85.1  --   PLT 131*   < > 124*  --  144*  --    < > = values in this interval not displayed.   Basic Metabolic Panel Recent Labs    04/19/19 0313 04/19/19 0313 04/19/19 0408 04/19/19 1635 04/20/19 0309 04/20/19 0440 04/21/19 0344 04/21/19 0504  NA 130*   < >   < >  --  131*   < > 133* 131*  K 4.2   < >   < >  --  4.0   < > 3.6 3.7  CL 93*   < >  --   --  96*  --  97*  --   CO2 27   < >  --   --  26  --  27  --   GLUCOSE 178*   < >  --   --  134*  --  150*  --   BUN 35*   < >  --   --  33*  --  27*  --   CREATININE 1.34*   < >  --   --  1.13  --  1.15  --   CALCIUM 8.1*   < >  --   --  7.6*  --  7.7*  --   MG 1.8   < >   < > 1.9 1.9  --  2.4  --   PHOS 4.2  --   --  3.5  --   --   --   --    < > = values in this interval not displayed.   Liver Function Tests No results for input(s): AST, ALT, ALKPHOS, BILITOT, PROT, ALBUMIN in the last 72 hours. No results for input(s): LIPASE, AMYLASE in the last 72 hours. Cardiac Enzymes No results for input(s): CKTOTAL, CKMB, CKMBINDEX, TROPONINI in the last 72 hours.  BNP: BNP (last 3 results) Recent Labs    03/21/2019 0113  BNP 655.7*    ProBNP (last 3 results) No results for input(s): PROBNP in the last 8760 hours.    D-Dimer No results for input(s): DDIMER in the last 72 hours. Hemoglobin A1C No results for input(s): HGBA1C in the last 72 hours. Fasting Lipid Panel No results for input(s): CHOL, HDL, LDLCALC, TRIG, CHOLHDL, LDLDIRECT in the last 72 hours. Thyroid Function Tests No results for input(s): TSH, T4TOTAL, T3FREE, THYROIDAB in the last 72 hours.  Invalid input(s): FREET3  Other results:   Imaging    DG Chest Port 1 View  Result Date: 04/21/2019 CLINICAL DATA:  Chest tubes present.  Patient extubated. EXAM: PORTABLE CHEST 1 VIEW COMPARISON:  April 19, 2019 FINDINGS: Endotracheal tube and nasogastric tube have been removed. There is a chest tube on each side. Swan-Ganz catheter tip is in the right main pulmonary artery. Balloon pump catheter tip is in the right ventricle. There is a small right apical pneumothorax. There is a minimal pneumothorax on the left. There is atelectatic change in the right base. Lungs elsewhere clear. Heart size and pulmonary vascularity are normal. No adenopathy. There is aortic atherosclerosis. No bone lesions. IMPRESSION: Chest tube on each side with rather minimal pneumothorax on each side. Atelectatic change right base. Lungs elsewhere clear. Other tubes and catheters as described. Stable cardiac silhouette. Aortic Atherosclerosis (ICD10-I70.0). Electronically Signed   By: Lowella Grip III M.D.   On: 04/21/2019 07:53     Medications:     Scheduled Medications: . aspirin EC  81 mg Oral Daily  . chlorhexidine  15 mL Mouth Rinse BID  . Chlorhexidine Gluconate Cloth  6 each Topical Daily  . digoxin  0.125 mg Oral Daily  . feeding supplement (PRO-STAT SUGAR FREE 64)  30 mL Oral BID  . fentaNYL (SUBLIMAZE) injection  100 mcg Intravenous Once  . gabapentin  100 mg Oral Q8H  . insulin aspart  0-24 Units Subcutaneous Q4H  . mouth rinse  15 mL Mouth Rinse q12n4p  . multivitamin with minerals  1 tablet Oral Daily  . oxyCODONE  10 mg Oral Q6H  .  pantoprazole (PROTONIX) IV  40 mg Intravenous Q24H  . pneumococcal 23 valent vaccine  0.5 mL Intramuscular Tomorrow-1000  . rosuvastatin  40 mg Oral q1800  . sodium chloride flush  10-40 mL Intracatheter Q12H  . spironolactone  12.5 mg Oral Daily    Infusions: . amiodarone 30 mg/hr (04/21/19 0800)  . cefTRIAXone (ROCEPHIN)  IV Stopped (04/20/19 1131)  . dexmedetomidine (PRECEDEX) IV infusion    . feeding supplement (VITAL 1.5 CAL)    . impella catheter heparin 50 unit/mL in dextrose 5%    . heparin 750 Units/hr (04/21/19 0800)  . insulin Stopped (04/20/19 1208)  . lactated ringers 20 mL/hr at 04/21/19 0800  . milrinone 0.25 mcg/kg/min (04/21/19 0800)  . norepinephrine (LEVOPHED) Adult infusion 4 mcg/min (04/21/19 0800)    PRN Medications: acetaminophen, bisacodyl, docusate, fentaNYL, fentaNYL (SUBLIMAZE) injection, fentaNYL (SUBLIMAZE) injection, hydrALAZINE, midazolam, midazolam, nitroGLYCERIN, ondansetron (ZOFRAN) IV, ondansetron (ZOFRAN) IV, sodium chloride flush, traMADol     Assessment/Plan    1.Acute systolic HF ->  Cardiogenic Shock  - Due to iCM - Impella 5.5 placed on 4/2.  Hemodynamics much improved.  Cardiac output has normalized.  Pains on low-dose norepinephrine for blood pressure support. - Volume status looks good. CVP low. Off lasix.  I turned Impella down to P6 yesterday may need to give a little bit of fluid back if he has suction alarms. - Wean NE as tolerated. Continue milrinone - Continue digoxin and spiro - Plan high risk CABG/MVR tomorrow with Impella support -Impella waveforms look good.  LDH stable at 267.  Platelets stable at 144 -Remains on systemic heparin and heparin in the purge with no bleeding.  Heparin level remains low but I think this is adequate as there is no other indication for anticoagulation except his pump.  2. CAD - LHC with severe 3 vessel CAD  - No current signs or symptoms of angina -Plan CABG/MVR tomorrow  3. Acute hypoxic  respiratory failure - intubated 4/1. Extubated 4/4 - Respiratory status improved after drainage of bilateral effusions -Doing well on nasal cannula.  4. Mitral Regurgitation - Mod-severe on ECHO - Plan MVR at time of CABG -No change  5. Uncontrolled DM -Hgb A1C 9.  - On insulin and sliding scale.   6. AKI - due to shock - improved with hemodynamic support - creatinine stable at 1.1 today  7. Acute blood loss Anemia  -transfuse as need to keep Hgb > 7.5  8. Polymorphic VT - Off lido. - Quiescent on amio -Keep K >4.  K is 3.6 today we will stop.  [place]ium is 2.4 -Keep  Mag >2   9. Neuropathy -Very limited with severe neuropathy. No sensation R and L foot and occasionaly in his hands. Requires assistance with ADLs.   10. Severe Malnutrition -Prealbumin 8.9  -Nutrition on board - Continue TFs  11. Leukocytosis - White count back up slightly today - Remains afebrile on ceftriaxone - cultures NGTD  12. Hematuria  - due to Foley trauma. Resolved.    CRITICAL CARE Performed by: Glori Bickers  Total critical care time: 35 minutes  Critical care time was exclusive of separately billable procedures and treating other patients.  Critical care was necessary to treat or prevent imminent or life-threatening deterioration.  Critical care was time spent personally by me (independent of midlevel providers or residents) on the following activities: development of treatment plan with patient and/or surrogate as well as nursing, discussions with consultants, evaluation  of patient's response to treatment, examination of patient, obtaining history from patient or surrogate, ordering and performing treatments and interventions, ordering and review of laboratory studies, ordering and review of radiographic studies, pulse oximetry and re-evaluation of patient's condition.    Length of Stay: [place], MD  04/21/2019, 8:24 AM  Advanced Heart Failure Team Pager  [phone] (M-F; 7a - 4p)  Please contact [facility] for night-coverage after hours (4p -7a ) and weekends on [email or web address]

## 2019-04-21 NOTE — Progress Notes (Signed)
3 Days Post-Op Procedure(s) (LRB): Placement of Impella 5.5 Direct (Right) TRANSESOPHAGEAL ECHOCARDIOGRAM (TEE) (N/A) Subjective: Extubated, normal hemodynamics on 5.5 Impella CXR clear nsr On low dose milrinone and norepi Good appetite Objective: Vital signs in last 24 hours: Temp:  [97.3 F (36.3 C)-99.9 F (37.7 C)] 97.3 F (36.3 C) (04/05 0900) Pulse Rate:  [71-94] 81 (04/05 0900) Cardiac Rhythm: Normal sinus rhythm (04/05 0800) Resp:  [9-23] 11 (04/05 0900) BP: (97-118)/(48-80) 97/48 (04/05 0900) SpO2:  [91 %-100 %] 98 % (04/05 0900) Arterial Line BP: (101-134)/(47-64) 104/54 (04/05 0900) Weight:  [67.7 kg] 67.7 kg (04/05 0600)  Hemodynamic parameters for last 24 hours: PAP: (17-35)/(0-12) 35/11 CVP:  [0 mmHg-9 mmHg] 4 mmHg CO:  [7.2 L/min-9 L/min] 7.4 L/min CI:  [3.9 L/min/m2-4.8 L/min/m2] 4 L/min/m2  Intake/Output from previous day: 04/04 0701 - 04/05 0700 In: 2271.1 [I.V.:1888.6; IV Piggyback:176.5] Out: 2250 [Urine:1910; Chest Tube:340] Intake/Output this shift: Total I/O In: 80.9 [I.V.:64.5; Other:16.4] Out: 85 [Urine:65; Chest Tube:20]       Exam    General- alert and comfortable    Neck- no JVD, no cervical adenopathy palpable, no carotid bruit. R axillary Impella secure   Lungs- clear without rales, wheezes   Cor- regular rate and rhythm, no murmur , gallop   Abdomen- soft, non-tender   Extremities - warm, non-tender, minimal edema   Neuro- oriented, appropriate, no focal weakness   Lab Results: Recent Labs    04/20/19 0907 04/20/19 0907 04/21/19 0344 04/21/19 0504  WBC 14.9*  --  18.4*  --   HGB 8.8*   < > 9.1* 8.5*  HCT 26.5*   < > 26.9* 25.0*  PLT 124*  --  144*  --    < > = values in this interval not displayed.   BMET:  Recent Labs    04/20/19 0309 04/20/19 0440 04/21/19 0344 04/21/19 0504  NA 131*   < > 133* 131*  K 4.0   < > 3.6 3.7  CL 96*  --  97*  --   CO2 26  --  27  --   GLUCOSE 134*  --  150*  --   BUN 33*  --  27*   --   CREATININE 1.13  --  1.15  --   CALCIUM 7.6*  --  7.7*  --    < > = values in this interval not displayed.    PT/INR: No results for input(s): LABPROT, INR in the last 72 hours. ABG    Component Value Date/Time   PHART 7.466 (H) 04/21/2019 0504   HCO3 25.8 04/21/2019 0504   TCO2 27 04/21/2019 0504   ACIDBASEDEF 3.0 (H) 05/14/2019 0853   ACIDBASEDEF 2.0 05/12/2019 0853   O2SAT 92.0 04/21/2019 0504   CBG (last 3)  Recent Labs    04/21/19 0325 04/21/19 0749 04/21/19 0803  GLUCAP 149* 171* 172*    Assessment/Plan: S/P Procedure(s) (LRB): Placement of Impella 5.5 Direct (Right) TRANSESOPHAGEAL ECHOCARDIOGRAM (TEE) (N/A) Plan CABG and Mitral rep/replacement in am Will cont Impella for postop support  LOS: 7 days    Brendan [name] 04/21/2019

## 2019-04-21 NOTE — Evaluation (Signed)
Occupational Therapy Evaluation Patient Details Name: Brendan [name] MRN: [id number] DOB: 1955/12/29 Today's Date: 04/21/2019    History of Present Illness Pt is a 59 yoM with PMHx of Tobacco abuse, HTN, poorly controlled DM, diabetic neuropathy originally presented with SOB and fatigue at [facility] found to have new HFrEF,  NSTEMI, and bilateral pleural effusions transferred to [facility] on 3/29 for further cardiac evaluation.  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  Started on lasix and milrinone gtts however developed NSVT.  Was taken 4/1 for placement of IABP and swan for optimization prior to CABG.  Was on precedex for agitation/ confusion, some concern for DTs.  On the evening of 4/1, patient developed worsening respiratory distress and hypoxia, head R/L heart cath on 3/30 & R heart cath on 4/1 pt intubated on 4/1, extubated 4/4, had R & L chest tubes placed 4/2 due to bilateral pleural effusions, removed 4/5 and impella device placed 4/2.  CABG planned for 05/06/2019.   Clinical Impression   Pt PTA: Pt independent at home with spouse. Pt currently min to modA+2 for steps to recliner and modA overall for ADL. RUE limited by impella placement to 90* and avoid push/pull motions. Pt appears A/O x4 with cues for correct month. Pt anxious to return to PLOF. Anticipate pt to progress well. Assist +4 for lines and pt safety. Pt would benefit from continued OT skilled services for ADL, mobility and safety. OT following acutely.  VSS on RA.      Follow Up Recommendations  Home health OT;Supervision/Assistance - 24 hour(initially)    Equipment Recommendations  3 in 1 bedside commode    Recommendations for Other Services       Precautions / Restrictions Precautions Precautions: Fall Precaution Comments: watch R arm no elevation above 90 with impella device Restrictions Other Position/Activity Restrictions: reports one fall PTA states tripped on porch, noted scrape on L knee      Mobility Bed  Mobility Overal bed mobility: Needs Assistance Bed Mobility: Supine to Sit     Supine to sit: Min assist;HOB elevated     General bed mobility comments: Pt using LUE to pull with for trunk elevation  Transfers Overall transfer level: Needs assistance Equipment used: 2 person hand held assist Transfers: Sit to/from [facility] Sit to Stand: Mod assist;+2 physical assistance Stand pivot transfers: Min assist;+2 safety/equipment       General transfer comment: assist for power-up    Balance Overall balance assessment: Needs assistance   Sitting balance-Leahy Scale: Fair Sitting balance - Comments: seated EOB without UE support   Standing balance support: Single extremity supported Standing balance-Leahy Scale: Poor Standing balance comment: HHA in standing with multiple lines no [name]/o dizziness                           ADL either performed or assessed with clinical judgement   ADL Overall ADL's : Needs assistance/impaired Eating/Feeding: Set up;Sitting   Grooming: Minimal assistance;Sitting;Standing   Upper Body Bathing: Supervision/ safety;Sitting;Standing   Lower Body Bathing: Moderate assistance;Cueing for safety;Sitting/lateral leans;Sit to/from stand   Upper Body Dressing : Minimal assistance;Sitting   Lower Body Dressing: Moderate assistance;Sitting/lateral leans;Sit to/from stand   Toilet Transfer: Moderate assistance;+2 for physical assistance;Stand-pivot;BSC Toilet Transfer Details (indicate cue type and reason): simulated to recliner with +2 hand held assist Toileting- Clothing Manipulation and Hygiene: Moderate assistance;+2 for physical assistance;Cueing for safety;Sitting/lateral lean;Sit to/from stand       Functional  mobility during ADLs: Moderate assistance;Cueing for safety;Cueing for sequencing General ADL Comments: Pt with decreased ability to care for self, decreased strength, decreased mobility and decreased activity  tolerance.     Vision Baseline Vision/History: Wears glasses Wears Glasses: Reading only Patient Visual Report: No change from baseline Vision Assessment?: No apparent visual deficits Additional Comments: Pt reading clock on wall and able to read the menu     Perception     Praxis      Pertinent Vitals/Pain Pain Assessment: No/denies pain Faces Pain Scale: Hurts little more Pain Location: R arm with movement Pain Descriptors / Indicators: Grimacing;Sore Pain Intervention(s): Monitored during session     Hand Dominance Right   Extremity/Trunk Assessment Upper Extremity Assessment Upper Extremity Assessment: Generalized weakness;RUE deficits/detail;LUE deficits/detail RUE Deficits / Details: FF to 90* nothing beyond due to impella; elbow through digits, WFLs. LUE Deficits / Details: WNLs, full ROM   Lower Extremity Assessment Lower Extremity Assessment: Defer to PT evaluation;Generalized weakness RLE Sensation: history of peripheral neuropathy LLE Sensation: history of peripheral neuropathy   Cervical / Trunk Assessment Cervical / Trunk Assessment: Kyphotic   Communication Communication Communication: No difficulties   Cognition Arousal/Alertness: Awake/alert Behavior During Therapy: WFL for tasks assessed/performed Overall Cognitive Status: Impaired/Different from baseline Area of Impairment: Orientation                               General Comments: stated date was 03/22/19, but when corrected states it is April, otherwise [facility]   General Comments  VSS on RA for transfer >90% O2 with exertion.    Exercises     Shoulder Instructions      Home Living Family/patient expects to be discharged to:: Private residence Living Arrangements: Spouse/significant other Available Help at Discharge: Family Type of Home: Apartment Home Access: Level entry     Home Layout: One level     Bathroom Shower/Tub: Teacher, early years/pre: Standard      Home Equipment: Environmental consultant - 2 wheels;Cane - single point          Prior Functioning/Environment Level of Independence: Independent        Comments: wife did all IADL's, except pt was able to drive        OT Problem List: Decreased strength;Decreased activity tolerance;Impaired balance (sitting and/or standing);Decreased cognition;Decreased safety awareness;Decreased knowledge of precautions      OT Treatment/Interventions: Self-care/ADL training;Therapeutic exercise;Neuromuscular education;Energy conservation;DME and/or AE instruction;Therapeutic activities;Patient/family education;Visual/perceptual remediation/compensation;Other (comment);Cognitive remediation/compensation    OT Goals(Current goals can be found in the care plan section) Acute Rehab OT Goals Patient Stated Goal: to return to independent OT Goal Formulation: With patient Time For Goal Achievement: 05/05/19 Potential to Achieve Goals: Good ADL Goals Pt Will Perform Grooming: with modified independence;standing Pt Will Perform Lower Body Dressing: with supervision;sitting/lateral leans;sit to/from stand Pt/caregiver will Perform Home Exercise Program: Increased strength;Both right and left upper extremity;With Supervision Additional ADL Goal #1: Pt  will verbalize 3 strategies to conserve energy during functional ADL and mobility tasks with no verbal cues  OT Frequency: Min 2X/week   Barriers to D/[name]:            Co-evaluation PT/OT/SLP Co-Evaluation/Treatment: Yes Reason for Co-Treatment: Complexity of the patient's impairments (multi-system involvement)   OT goals addressed during session: ADL's and self-care      AM-PAC OT "6 Clicks" Daily Activity     Outcome Measure Help from another person eating meals?: A Little  Help from another person taking care of personal grooming?: A Little Help from another person toileting, which includes using toliet, bedpan, or urinal?: A Lot Help from another person  bathing (including washing, rinsing, drying)?: A Lot Help from another person to put on and taking off regular upper body clothing?: A Lot Help from another person to put on and taking off regular lower body clothing?: A Lot 6 Click Score: 14   End of Session Equipment Utilized During Treatment: Gait belt;Oxygen Nurse Communication: Mobility status  Activity Tolerance: Patient tolerated treatment well;Patient limited by fatigue Patient left: in chair;with call bell/phone within reach;with nursing/sitter in room;with chair alarm set  OT Visit Diagnosis: Unsteadiness on feet (R26.81);Muscle weakness (generalized) (M62.81)                Time: 6270-3500 OT Time Calculation (min): 32 min Charges:  OT General Charges $OT Visit: 1 Visit OT Evaluation $OT Eval Moderate Complexity: 1 Mod  Brendan [name], OTR/L Acute Rehabilitation Services Pager: [phone] Office: [phone]   Brendan [name] 04/21/2019, 3:01 PM

## 2019-04-21 NOTE — Progress Notes (Signed)
ANTICOAGULATION CONSULT NOTE  Pharmacy Consult for Heparin Indication: chest pain/ACS  Heparin Dosing Weight: 68.2 kg  Labs: Recent Labs    04/19/19 0313 04/19/19 0408 04/20/19 0258 04/20/19 0309 04/20/19 0440 04/20/19 0907 04/20/19 0907 04/20/19 1733 04/21/19 0344 04/21/19 0500 04/21/19 0504 04/21/19 1330  HGB 10.1*   < >  --  8.9*   < > 8.8*   < >  --  9.1*  --  8.5*  --   HCT 30.2*   < >  --  26.3*   < > 26.5*  --   --  26.9*  --  25.0*  --   PLT 190   < >  --  131*  --  124*  --   --  144*  --   --   --   APTT 46*  --   --   --   --   --   --   --   --   --   --   --   HEPARINUNFRC  --    < >   < >  --   --   --   --  <0.10*  --  <0.10*  --  0.13*  CREATININE 1.34*  --   --  1.13  --   --   --   --  1.15  --   --   --    < > = values in this interval not displayed.    Assessment: 64 yo male presenting to [facility] with malaise and SOB for 4 days found to have elevated troponin and new depressed EF. Transferred to [facility] for further evaluation. Patient was started on heparin IV on 3/26, no anticoagulation PTA.   Patient now s/p cath 3/30 showing severe 3-vessel obstructive CAD, high LV filling pressures, reduced cardiac output, and moderate pulmonary HTN. After cath patient was transfered to the ICU and place PICC for IV milrinone, planning CABG,  Received impella 5.5 support on 4/2 - plan for CABG this week  Heparin purge 8.56ml/hr = 420uts/hr + systemic heparin drip 750 uts/hr  = 1170 uts/hr  Heparin level 0.13 starting to increase but still < goal.  Pump function stable, no bleeding and urine clear.  CBC stable  Discussed with MDs  - no uptitrations tonight ok with current level and heparin drip rate   Goal of Therapy:  Heparin level target 0.2-0.25 per MD dicsussion Monitor platelets by anticoagulation protocol: Yes   Plan:  No change in heparin drip 750 uts/hr + purge Follow up post [place].D. CPP, BCPS Clinical  Pharmacist [phone] 04/21/2019 3:02 PM

## 2019-04-21 NOTE — Progress Notes (Signed)
ANTICOAGULATION CONSULT NOTE - Follow Up Consult  Pharmacy Consult for heparin Indication: CAD/Impella  Labs: Recent Labs    04/26/2019 2016 04/19/19 0047 04/19/19 0313 04/19/19 0313 04/19/19 0408 04/20/19 0258 04/20/19 0309 04/20/19 0440 04/20/19 0907 04/20/19 0907 04/20/19 1733 04/21/19 0344 04/21/19 0500 04/21/19 0504  HGB 10.5*  --  10.1*   < >   < >  --  8.9*   < > 8.8*   < >  --  9.1*  --  8.5*  HCT 31.7*  --  30.2*   < >   < >  --  26.3*   < > 26.5*  --   --  26.9*  --  25.0*  PLT 210  --  190   < >  --   --  131*  --  124*  --   --  144*  --   --   APTT  --   --  46*  --   --   --   --   --   --   --   --   --   --   --   HEPARINUNFRC  --    < >  --   --    < > <0.10*  --   --   --   --  <0.10*  --  <0.10*  --   CREATININE 1.60*  --  1.34*  --   --   --  1.13  --   --   --   --   --   --   --    < > = values in this interval not displayed.    Assessment: 64yo male remains subtherapeutic on heparin despite rate increases on systemic heparin; purge solution remains consistent at 420 units/hr; no gtt issues or signs of bleeding per RN.  Goal of Therapy:  Heparin level 0.2-0.25 units/ml   Plan:  Will increase systemic heparin gtt by 2 units/kg/hr to 750 units/hr and check level in 6 hours.    Wynona Neat, PharmD, BCPS  04/21/2019,6:21 AM

## 2019-04-21 NOTE — Progress Notes (Addendum)
Inpatient Diabetes Program Recommendations  AACE/ADA: New Consensus Statement on Inpatient Glycemic Control (2015)  Target Ranges:  Prepandial:   less than 140 mg/dL      Peak postprandial:   less than 180 mg/dL (1-2 hours)      Critically ill patients:  140 - 180 mg/dL   Lab Results  Component Value Date   GLUCAP 172 (H) 04/21/2019   HGBA1C 9.0 (H) 03/29/2019    Review of Glycemic Control Results for Brendan [name], Brendan [name] (MRN [id number]) as of 04/21/2019 10:39  Ref. Range 04/20/2019 23:54 04/21/2019 03:25 04/21/2019 07:49 04/21/2019 08:03  Glucose-Capillary Latest Ref Range: 70 - 99 mg/dL 210 (H) 149 (H) 171 (H) 172 (H)   Diabetes history: Type 2 DM Outpatient Diabetes medications: Basaglar 28 units QHS, Humalog 8 units BID, Metformin 500 mg BID Current orders for Inpatient glycemic control: Novolog 0-24 units Q4H  Inpatient Diabetes Program Recommendations:    Consider adding Levemir 12 units QD.  Plan for CABG/MVR in AM.  Thanks, [name], MSN, RNC-OB Diabetes Coordinator [phone] (8a-5p)

## 2019-04-22 ENCOUNTER — Inpatient Hospital Stay (HOSPITAL_COMMUNITY): Payer: Medicaid Other

## 2019-04-22 ENCOUNTER — Inpatient Hospital Stay (HOSPITAL_COMMUNITY): Payer: Medicaid Other | Admitting: Certified Registered Nurse Anesthetist

## 2019-04-22 ENCOUNTER — Inpatient Hospital Stay (HOSPITAL_COMMUNITY)
Admission: AD | Disposition: E | Payer: Self-pay | Source: Other Acute Inpatient Hospital | Attending: Cardiothoracic Surgery

## 2019-04-22 ENCOUNTER — Encounter (HOSPITAL_COMMUNITY): Payer: Self-pay | Admitting: Internal Medicine

## 2019-04-22 DIAGNOSIS — Z9889 Other specified postprocedural states: Secondary | ICD-10-CM

## 2019-04-22 HISTORY — PX: CORONARY ARTERY BYPASS GRAFT: SHX141

## 2019-04-22 HISTORY — PX: MITRAL VALVE REPLACEMENT: SHX147

## 2019-04-22 LAB — POCT I-STAT 7, (LYTES, BLD GAS, ICA,H+H)
Acid-Base Excess: 1 mmol/L (ref 0.0–2.0)
Acid-Base Excess: 1 mmol/L (ref 0.0–2.0)
Bicarbonate: 25.8 mmol/L (ref 20.0–28.0)
Bicarbonate: 24.7 mmol/L (ref 20.0–28.0)
Calcium, Ion: 1.23 mmol/L (ref 1.15–1.40)
Calcium, Ion: 1.17 mmol/L (ref 1.15–1.40)
HCT: 26 % — ABNORMAL LOW (ref 39.0–52.0)
HCT: 26 % — ABNORMAL LOW (ref 39.0–52.0)
Hemoglobin: 8.8 g/dL — ABNORMAL LOW (ref 13.0–17.0)
Hemoglobin: 8.8 g/dL — ABNORMAL LOW (ref 13.0–17.0)
O2 Saturation: 96 %
O2 Saturation: 96 %
Patient temperature: 36.5
Patient temperature: 37.6
Potassium: 3.5 mmol/L (ref 3.5–5.1)
Potassium: 4.6 mmol/L (ref 3.5–5.1)
Sodium: 137 mmol/L (ref 135–145)
Sodium: 137 mmol/L (ref 135–145)
TCO2: 27 mmol/L (ref 22–32)
TCO2: 26 mmol/L (ref 22–32)
pCO2 arterial: 38.2 mmHg (ref 32.0–48.0)
pCO2 arterial: 37.1 mmHg (ref 32.0–48.0)
pH, Arterial: 7.435 (ref 7.350–7.450)
pH, Arterial: 7.434 (ref 7.350–7.450)
pO2, Arterial: 77 mmHg — ABNORMAL LOW (ref 83.0–108.0)
pO2, Arterial: 85 mmHg (ref 83.0–108.0)

## 2019-04-22 LAB — FIBRINOGEN: Fibrinogen: 240 mg/dL (ref 210–475)

## 2019-04-22 LAB — GLUCOSE, CAPILLARY
Glucose-Capillary: 100 mg/dL — ABNORMAL HIGH (ref 70–99)
Glucose-Capillary: 108 mg/dL — ABNORMAL HIGH (ref 70–99)
Glucose-Capillary: 112 mg/dL — ABNORMAL HIGH (ref 70–99)
Glucose-Capillary: 168 mg/dL — ABNORMAL HIGH (ref 70–99)
Glucose-Capillary: 173 mg/dL — ABNORMAL HIGH (ref 70–99)
Glucose-Capillary: 73 mg/dL (ref 70–99)
Glucose-Capillary: 85 mg/dL (ref 70–99)
Glucose-Capillary: 85 mg/dL (ref 70–99)
Glucose-Capillary: 94 mg/dL (ref 70–99)
Glucose-Capillary: 99 mg/dL (ref 70–99)

## 2019-04-22 LAB — BASIC METABOLIC PANEL
Anion gap: 10 (ref 5–15)
Anion gap: 10 (ref 5–15)
BUN: 22 mg/dL (ref 8–23)
BUN: 21 mg/dL (ref 8–23)
CO2: 25 mmol/L (ref 22–32)
CO2: 23 mmol/L (ref 22–32)
Calcium: 7.9 mg/dL — ABNORMAL LOW (ref 8.9–10.3)
Calcium: 7.8 mg/dL — ABNORMAL LOW (ref 8.9–10.3)
Chloride: 96 mmol/L — ABNORMAL LOW (ref 98–111)
Chloride: 102 mmol/L (ref 98–111)
Creatinine, Ser: 1.15 mg/dL (ref 0.61–1.24)
Creatinine, Ser: 1.17 mg/dL (ref 0.61–1.24)
GFR calc Af Amer: >60 mL/min (ref >60)
GFR calc Af Amer: >60 mL/min (ref >60)
GFR calc non Af Amer: >60 mL/min (ref >60)
GFR calc non Af Amer: >60 mL/min (ref >60)
Glucose, Bld: 169 mg/dL — ABNORMAL HIGH (ref 70–99)
Glucose, Bld: 105 mg/dL — ABNORMAL HIGH (ref 70–99)
Potassium: 3.2 mmol/L — ABNORMAL LOW (ref 3.5–5.1)
Potassium: 4.5 mmol/L (ref 3.5–5.1)
Sodium: 131 mmol/L — ABNORMAL LOW (ref 135–145)
Sodium: 135 mmol/L (ref 135–145)

## 2019-04-22 LAB — PREPARE RBC (CROSSMATCH)

## 2019-04-22 LAB — CBC
HCT: 24.9 % — ABNORMAL LOW (ref 39.0–52.0)
HCT: 27.9 % — ABNORMAL LOW (ref 39.0–52.0)
Hemoglobin: 8.5 g/dL — ABNORMAL LOW (ref 13.0–17.0)
Hemoglobin: 9.6 g/dL — ABNORMAL LOW (ref 13.0–17.0)
MCH: 28.8 pg (ref 26.0–34.0)
MCH: 29.4 pg (ref 26.0–34.0)
MCHC: 34.1 g/dL (ref 30.0–36.0)
MCHC: 34.4 g/dL (ref 30.0–36.0)
MCV: 84.4 fL (ref 80.0–100.0)
MCV: 85.3 fL (ref 80.0–100.0)
Platelets: 66 10*3/uL — ABNORMAL LOW (ref 150–400)
Platelets: 81 10*3/uL — ABNORMAL LOW (ref 150–400)
RBC: 2.95 MIL/uL — ABNORMAL LOW (ref 4.22–5.81)
RBC: 3.27 MIL/uL — ABNORMAL LOW (ref 4.22–5.81)
RDW: 13.8 % (ref 11.5–15.5)
RDW: 13.9 % (ref 11.5–15.5)
WBC: 11.1 10*3/uL — ABNORMAL HIGH (ref 4.0–10.5)
WBC: 12.4 10*3/uL — ABNORMAL HIGH (ref 4.0–10.5)
nRBC: 0 % (ref 0.0–0.2)
nRBC: 0 % (ref 0.0–0.2)

## 2019-04-22 LAB — CALCIUM, IONIZED: Calcium, Ionized, Serum: 4.8 mg/dL (ref 4.5–5.6)

## 2019-04-22 LAB — CBC WITH DIFFERENTIAL/PLATELET
Abs Immature Granulocytes: 0.05 10*3/uL (ref 0.00–0.07)
Basophils Absolute: 0 10*3/uL (ref 0.0–0.1)
Basophils Relative: 0 %
Eosinophils Absolute: 0.2 10*3/uL (ref 0.0–0.5)
Eosinophils Relative: 2 %
HCT: 22.3 % — ABNORMAL LOW (ref 39.0–52.0)
Hemoglobin: 7.4 g/dL — ABNORMAL LOW (ref 13.0–17.0)
Immature Granulocytes: 1 %
Lymphocytes Relative: 10 %
Lymphs Abs: 1.1 10*3/uL (ref 0.7–4.0)
MCH: 28.4 pg (ref 26.0–34.0)
MCHC: 33.2 g/dL (ref 30.0–36.0)
MCV: 85.4 fL (ref 80.0–100.0)
Monocytes Absolute: 1.1 10*3/uL — ABNORMAL HIGH (ref 0.1–1.0)
Monocytes Relative: 10 %
Neutro Abs: 8.6 10*3/uL — ABNORMAL HIGH (ref 1.7–7.7)
Neutrophils Relative %: 77 %
Platelets: 114 10*3/uL — ABNORMAL LOW (ref 150–400)
RBC: 2.61 MIL/uL — ABNORMAL LOW (ref 4.22–5.81)
RDW: 13.5 % (ref 11.5–15.5)
WBC: 11.1 10*3/uL — ABNORMAL HIGH (ref 4.0–10.5)
nRBC: 0 % (ref 0.0–0.2)

## 2019-04-22 LAB — LACTATE DEHYDROGENASE: LDH: 201 U/L — ABNORMAL HIGH (ref 98–192)

## 2019-04-22 LAB — COOXEMETRY PANEL
Carboxyhemoglobin: 1.8 % — ABNORMAL HIGH (ref 0.5–1.5)
Methemoglobin: 1.1 % (ref 0.0–1.5)
O2 Saturation: 85.8 %
Total hemoglobin: 7.4 g/dL — ABNORMAL LOW (ref 12.0–16.0)

## 2019-04-22 LAB — HEPARIN LEVEL (UNFRACTIONATED): Heparin Unfractionated: <0.1 IU/mL — ABNORMAL LOW (ref 0.30–0.70)

## 2019-04-22 LAB — PROTIME-INR
INR: 1.6 — ABNORMAL HIGH (ref 0.8–1.2)
Prothrombin Time: 18.8 seconds — ABNORMAL HIGH (ref 11.4–15.2)

## 2019-04-22 LAB — MAGNESIUM
Magnesium: 2.4 mg/dL (ref 1.7–2.4)
Magnesium: 3.4 mg/dL — ABNORMAL HIGH (ref 1.7–2.4)

## 2019-04-22 LAB — HEMOGLOBIN AND HEMATOCRIT, BLOOD
HCT: 23.2 % — ABNORMAL LOW (ref 39.0–52.0)
Hemoglobin: 7.8 g/dL — ABNORMAL LOW (ref 13.0–17.0)

## 2019-04-22 LAB — PLATELET COUNT: Platelets: 63 10*3/uL — ABNORMAL LOW (ref 150–400)

## 2019-04-22 LAB — APTT: aPTT: 41 seconds — ABNORMAL HIGH (ref 24–36)

## 2019-04-22 SURGERY — CORONARY ARTERY BYPASS GRAFTING (CABG)
Anesthesia: General | Site: Chest

## 2019-04-22 MED ORDER — ALBUMIN HUMAN 5 % IV SOLN
250.0000 mL | INTRAVENOUS | Status: DC | PRN
  Administered 2019-04-22: 12.5 g via INTRAVENOUS

## 2019-04-22 MED ORDER — INSULIN REGULAR(HUMAN) IN NACL 100-0.9 UT/100ML-% IV SOLN
INTRAVENOUS | Status: DC
  Administered 2019-04-23: 1 [IU]/h via INTRAVENOUS
  Filled 2019-04-22: qty 100

## 2019-04-22 MED ORDER — LACTATED RINGERS IV SOLN: INTRAVENOUS | Status: DC

## 2019-04-22 MED ORDER — POTASSIUM CHLORIDE CRYS ER 20 MEQ PO TBCR
40.0000 meq | EXTENDED_RELEASE_TABLET | Freq: Once | ORAL | Status: AC
  Administered 2019-04-22: 40 meq via ORAL
  Filled 2019-04-22: qty 2

## 2019-04-22 MED ORDER — HEMOSTATIC AGENTS (NO CHARGE) OPTIME
TOPICAL | Status: DC | PRN
  Administered 2019-04-22 (×4): 1 via TOPICAL

## 2019-04-22 MED ORDER — GABAPENTIN 100 MG PO CAPS
100.0000 mg | ORAL_CAPSULE | Freq: Three times a day (TID) | ORAL | Status: DC
  Administered 2019-04-23 – 2019-04-24 (×6): 100 mg via ORAL
  Filled 2019-04-22 (×6): qty 1

## 2019-04-22 MED ORDER — ROSUVASTATIN CALCIUM 20 MG PO TABS
40.0000 mg | ORAL_TABLET | Freq: Every day | ORAL | Status: DC
  Administered 2019-04-24: 40 mg via ORAL
  Filled 2019-04-22 (×2): qty 2

## 2019-04-22 MED ORDER — ORAL CARE MOUTH RINSE
15.0000 mL | OROMUCOSAL | Status: DC
  Administered 2019-04-22 – 2019-04-23 (×7): 15 mL via OROMUCOSAL

## 2019-04-22 MED ORDER — PROTAMINE SULFATE 10 MG/ML IV SOLN
INTRAVENOUS | Status: DC | PRN
  Administered 2019-04-22: 20 mg via INTRAVENOUS
  Administered 2019-04-22: 220 mg via INTRAVENOUS

## 2019-04-22 MED ORDER — MIDAZOLAM HCL 5 MG/5ML IJ SOLN
INTRAMUSCULAR | Status: DC | PRN
  Administered 2019-04-22: 2 mg via INTRAVENOUS
  Administered 2019-04-22: 1 mg via INTRAVENOUS
  Administered 2019-04-22: 3 mg via INTRAVENOUS
  Administered 2019-04-22: 1 mg via INTRAVENOUS
  Administered 2019-04-22: 2 mg via INTRAVENOUS
  Administered 2019-04-22: 1 mg via INTRAVENOUS

## 2019-04-22 MED ORDER — CHLORHEXIDINE GLUCONATE CLOTH 2 % EX PADS
6.0000 | MEDICATED_PAD | Freq: Every day | CUTANEOUS | Status: DC
  Administered 2019-04-22 – 2019-05-18 (×27): 6 via TOPICAL

## 2019-04-22 MED ORDER — CHLORHEXIDINE GLUCONATE 0.12% ORAL RINSE (MEDLINE KIT)
15.0000 mL | Freq: Two times a day (BID) | OROMUCOSAL | Status: DC
  Administered 2019-04-22 – 2019-04-27 (×10): 15 mL via OROMUCOSAL

## 2019-04-22 MED ORDER — POTASSIUM CHLORIDE 10 MEQ/50ML IV SOLN
10.0000 meq | INTRAVENOUS | Status: AC
  Administered 2019-04-22 (×3): 10 meq via INTRAVENOUS

## 2019-04-22 MED ORDER — MILRINONE LACTATE IN DEXTROSE 20-5 MG/100ML-% IV SOLN: 0.3000 ug/kg/min | INTRAVENOUS | Status: DC

## 2019-04-22 MED ORDER — CHLORHEXIDINE GLUCONATE 0.12 % MT SOLN
15.0000 mL | OROMUCOSAL | Status: AC
  Administered 2019-04-22: 15 mL via OROMUCOSAL

## 2019-04-22 MED ORDER — ONDANSETRON HCL 4 MG/2ML IJ SOLN
4.0000 mg | Freq: Four times a day (QID) | INTRAMUSCULAR | Status: DC | PRN
  Administered 2019-05-07 – 2019-05-08 (×4): 4 mg via INTRAVENOUS
  Filled 2019-04-22 (×4): qty 2

## 2019-04-22 MED ORDER — LACTATED RINGERS IV SOLN: INTRAVENOUS | Status: DC | PRN

## 2019-04-22 MED ORDER — EPINEPHRINE HCL 5 MG/250ML IV SOLN IN NS
0.0000 ug/min | INTRAVENOUS | Status: DC
  Administered 2019-04-23: 3 ug/min via INTRAVENOUS
  Administered 2019-04-24: 4 ug/min via INTRAVENOUS
  Administered 2019-04-25: 3 ug/min via INTRAVENOUS
  Administered 2019-04-25: 4 ug/min via INTRAVENOUS
  Administered 2019-04-27: 10 ug/min via INTRAVENOUS
  Filled 2019-04-22 (×5): qty 250

## 2019-04-22 MED ORDER — LIDOCAINE 2% (20 MG/ML) 5 ML SYRINGE
INTRAMUSCULAR | Status: DC | PRN
  Administered 2019-04-22: 100 mg via INTRAVENOUS

## 2019-04-22 MED ORDER — ROCURONIUM BROMIDE 10 MG/ML (PF) SYRINGE
PREFILLED_SYRINGE | INTRAVENOUS | Status: DC | PRN
  Administered 2019-04-22: 20 mg via INTRAVENOUS
  Administered 2019-04-22: 100 mg via INTRAVENOUS
  Administered 2019-04-22: 20 mg via INTRAVENOUS
  Administered 2019-04-22: 50 mg via INTRAVENOUS

## 2019-04-22 MED ORDER — SODIUM CHLORIDE (PF) 0.9 % IJ SOLN
OROMUCOSAL | Status: DC | PRN
  Administered 2019-04-22: 09:00:00 4 mL via TOPICAL

## 2019-04-22 MED ORDER — VANCOMYCIN HCL IN DEXTROSE 1-5 GM/200ML-% IV SOLN
1000.0000 mg | Freq: Once | INTRAVENOUS | Status: AC
  Administered 2019-04-22: 22:00:00 1000 mg via INTRAVENOUS
  Filled 2019-04-22: qty 200

## 2019-04-22 MED ORDER — METOPROLOL TARTRATE 5 MG/5ML IV SOLN: 2.5000 mg | INTRAVENOUS | Status: DC | PRN

## 2019-04-22 MED ORDER — SODIUM CHLORIDE 0.9 % IV SOLN
INTRAVENOUS | Status: DC | PRN
  Administered 2019-04-22: 20 ug via INTRAVENOUS

## 2019-04-22 MED ORDER — BISACODYL 5 MG PO TBEC
10.0000 mg | DELAYED_RELEASE_TABLET | Freq: Every day | ORAL | Status: DC
  Administered 2019-04-23 – 2019-05-10 (×5): 10 mg via ORAL
  Filled 2019-04-22 (×7): qty 2

## 2019-04-22 MED ORDER — BISACODYL 10 MG RE SUPP
10.0000 mg | Freq: Every day | RECTAL | Status: DC
  Administered 2019-04-27 – 2019-05-01 (×3): 10 mg via RECTAL
  Filled 2019-04-22 (×3): qty 1

## 2019-04-22 MED ORDER — FAMOTIDINE IN NACL 20-0.9 MG/50ML-% IV SOLN
20.0000 mg | Freq: Two times a day (BID) | INTRAVENOUS | Status: AC
  Administered 2019-04-22 – 2019-04-23 (×2): 20 mg via INTRAVENOUS

## 2019-04-22 MED ORDER — EPINEPHRINE PF 1 MG/ML IJ SOLN
0.0000 ug/min | INTRAVENOUS | Status: DC
  Filled 2019-04-22: qty 4

## 2019-04-22 MED ORDER — FENTANYL CITRATE (PF) 250 MCG/5ML IJ SOLN
INTRAMUSCULAR | Status: AC
  Filled 2019-04-22: qty 10

## 2019-04-22 MED ORDER — LACTATED RINGERS IV SOLN: 500.0000 mL | Freq: Once | INTRAVENOUS | Status: DC | PRN

## 2019-04-22 MED ORDER — SODIUM CHLORIDE 0.9% FLUSH: 3.0000 mL | INTRAVENOUS | Status: DC | PRN

## 2019-04-22 MED ORDER — OXYCODONE HCL 5 MG PO TABS
5.0000 mg | ORAL_TABLET | ORAL | Status: DC | PRN
  Administered 2019-04-23 – 2019-04-24 (×6): 10 mg via ORAL
  Administered 2019-04-26: 5 mg via ORAL
  Administered 2019-04-27: 10 mg via ORAL
  Administered 2019-04-27 (×2): 5 mg via ORAL
  Filled 2019-04-22 (×7): qty 2
  Filled 2019-04-22 (×3): qty 1
  Filled 2019-04-22: qty 2

## 2019-04-22 MED ORDER — FENTANYL CITRATE (PF) 250 MCG/5ML IJ SOLN
INTRAMUSCULAR | Status: DC | PRN
  Administered 2019-04-22: 100 ug via INTRAVENOUS
  Administered 2019-04-22: 200 ug via INTRAVENOUS
  Administered 2019-04-22 (×2): 100 ug via INTRAVENOUS

## 2019-04-22 MED ORDER — SODIUM CHLORIDE 0.45 % IV SOLN: INTRAVENOUS | Status: DC | PRN

## 2019-04-22 MED ORDER — PANTOPRAZOLE SODIUM 40 MG PO TBEC
40.0000 mg | DELAYED_RELEASE_TABLET | Freq: Every day | ORAL | Status: DC
  Administered 2019-04-24: 40 mg via ORAL
  Filled 2019-04-22: qty 1

## 2019-04-22 MED ORDER — MILRINONE LACTATE IN DEXTROSE 20-5 MG/100ML-% IV SOLN
0.1250 ug/kg/min | INTRAVENOUS | Status: DC
  Administered 2019-04-22 – 2019-04-24 (×4): 0.25 ug/kg/min via INTRAVENOUS
  Administered 2019-04-25: 0.125 ug/kg/min via INTRAVENOUS
  Filled 2019-04-22 (×4): qty 100

## 2019-04-22 MED ORDER — SODIUM CHLORIDE 0.9% FLUSH
3.0000 mL | Freq: Two times a day (BID) | INTRAVENOUS | Status: DC
  Administered 2019-04-23 – 2019-04-24 (×2): 3 mL via INTRAVENOUS

## 2019-04-22 MED ORDER — SODIUM CHLORIDE 0.9 % IV SOLN: INTRAVENOUS | Status: DC | PRN

## 2019-04-22 MED ORDER — MORPHINE SULFATE (PF) 2 MG/ML IV SOLN
1.0000 mg | INTRAVENOUS | Status: DC | PRN
  Administered 2019-04-23: 4 mg via INTRAVENOUS
  Administered 2019-04-23: 2 mg via INTRAVENOUS
  Administered 2019-04-23 (×4): 4 mg via INTRAVENOUS
  Administered 2019-04-25 – 2019-04-26 (×2): 2 mg via INTRAVENOUS
  Filled 2019-04-22: qty 2
  Filled 2019-04-22: qty 1
  Filled 2019-04-22 (×2): qty 2
  Filled 2019-04-22 (×2): qty 1
  Filled 2019-04-22 (×2): qty 2

## 2019-04-22 MED ORDER — TRAMADOL HCL 50 MG PO TABS
50.0000 mg | ORAL_TABLET | ORAL | Status: DC | PRN
  Administered 2019-04-24 – 2019-04-26 (×2): 100 mg via ORAL
  Filled 2019-04-22 (×2): qty 2

## 2019-04-22 MED ORDER — NOREPINEPHRINE 4 MG/250ML-% IV SOLN
0.0000 ug/min | INTRAVENOUS | Status: DC
  Administered 2019-04-22: 13 ug/min via INTRAVENOUS
  Administered 2019-04-23: 10 ug/min via INTRAVENOUS
  Administered 2019-04-23: 15 ug/min via INTRAVENOUS
  Administered 2019-04-23: 15.013 ug/min via INTRAVENOUS
  Filled 2019-04-22 (×5): qty 250

## 2019-04-22 MED ORDER — CALCIUM CHLORIDE 10 % IV SOLN
INTRAVENOUS | Status: DC | PRN
  Administered 2019-04-22: 200 mg via INTRAVENOUS
  Administered 2019-04-22: 300 mg via INTRAVENOUS

## 2019-04-22 MED ORDER — SODIUM CHLORIDE 0.9 % IV SOLN
1.5000 g | Freq: Two times a day (BID) | INTRAVENOUS | Status: DC
  Administered 2019-04-22 – 2019-04-23 (×2): 1.5 g via INTRAVENOUS
  Filled 2019-04-22 (×3): qty 1.5

## 2019-04-22 MED ORDER — ETOMIDATE 2 MG/ML IV SOLN
INTRAVENOUS | Status: DC | PRN
  Administered 2019-04-22: 20 mg via INTRAVENOUS

## 2019-04-22 MED ORDER — SODIUM CHLORIDE 0.9 % IV SOLN: INTRAVENOUS | Status: DC

## 2019-04-22 MED ORDER — METOPROLOL TARTRATE 25 MG/10 ML ORAL SUSPENSION: 12.5000 mg | Freq: Two times a day (BID) | ORAL | Status: DC

## 2019-04-22 MED ORDER — MAGNESIUM SULFATE 4 GM/100ML IV SOLN
4.0000 g | Freq: Once | INTRAVENOUS | Status: AC
  Administered 2019-04-22: 4 g via INTRAVENOUS
  Filled 2019-04-22: qty 100

## 2019-04-22 MED ORDER — ASPIRIN 81 MG PO CHEW
324.0000 mg | CHEWABLE_TABLET | Freq: Every day | ORAL | Status: DC
  Administered 2019-04-25 – 2019-06-20 (×56): 324 mg
  Filled 2019-04-22 (×58): qty 4

## 2019-04-22 MED ORDER — DEXMEDETOMIDINE HCL IN NACL 400 MCG/100ML IV SOLN
0.0000 ug/kg/h | INTRAVENOUS | Status: DC
  Administered 2019-04-22: 0.6 ug/kg/h via INTRAVENOUS
  Administered 2019-04-23: 0.4 ug/kg/h via INTRAVENOUS
  Filled 2019-04-22 (×2): qty 100

## 2019-04-22 MED ORDER — DEXTROSE 50 % IV SOLN: 0.0000 mL | INTRAVENOUS | Status: DC | PRN

## 2019-04-22 MED ORDER — METOPROLOL TARTRATE 12.5 MG HALF TABLET: 12.5000 mg | ORAL_TABLET | Freq: Two times a day (BID) | ORAL | Status: DC

## 2019-04-22 MED ORDER — SODIUM CHLORIDE 0.9 % IV SOLN
INTRAVENOUS | Status: DC | PRN
  Administered 2019-04-22: 750 mg via INTRAVENOUS

## 2019-04-22 MED ORDER — ALBUMIN HUMAN 5 % IV SOLN: INTRAVENOUS | Status: DC | PRN

## 2019-04-22 MED ORDER — SODIUM CHLORIDE 0.9 % IV SOLN
20.0000 ug | INTRAVENOUS | Status: DC
  Filled 2019-04-22: qty 5

## 2019-04-22 MED ORDER — SODIUM CHLORIDE (PF) 0.9 % IJ SOLN
OROMUCOSAL | Status: DC | PRN
  Administered 2019-04-22: 4 mL via TOPICAL

## 2019-04-22 MED ORDER — HEPARIN SODIUM (PORCINE) 1000 UNIT/ML IJ SOLN
INTRAMUSCULAR | Status: DC | PRN
  Administered 2019-04-22: 22000 [IU] via INTRAVENOUS
  Administered 2019-04-22: 2000 [IU] via INTRAVENOUS

## 2019-04-22 MED ORDER — 0.9 % SODIUM CHLORIDE (POUR BTL) OPTIME
TOPICAL | Status: DC | PRN
  Administered 2019-04-22: 6000 mL

## 2019-04-22 MED ORDER — SODIUM CHLORIDE 0.9 % IV SOLN
250.0000 mL | INTRAVENOUS | Status: DC
  Administered 2019-04-25 – 2019-04-30 (×2): 250 mL via INTRAVENOUS

## 2019-04-22 MED ORDER — METOCLOPRAMIDE HCL 5 MG/ML IJ SOLN
10.0000 mg | Freq: Four times a day (QID) | INTRAMUSCULAR | Status: AC
  Administered 2019-04-23 – 2019-04-27 (×18): 10 mg via INTRAVENOUS
  Filled 2019-04-22 (×18): qty 2

## 2019-04-22 MED ORDER — MICROFIBRILLAR COLL HEMOSTAT EX PADS
MEDICATED_PAD | CUTANEOUS | Status: DC | PRN
  Administered 2019-04-22: 1 via TOPICAL

## 2019-04-22 MED ORDER — MIDAZOLAM HCL (PF) 10 MG/2ML IJ SOLN
INTRAMUSCULAR | Status: AC
  Filled 2019-04-22: qty 2

## 2019-04-22 MED ORDER — MIDAZOLAM HCL 2 MG/2ML IJ SOLN: 2.0000 mg | INTRAMUSCULAR | Status: DC | PRN

## 2019-04-22 MED ORDER — DOCUSATE SODIUM 100 MG PO CAPS
200.0000 mg | ORAL_CAPSULE | Freq: Every day | ORAL | Status: DC
  Administered 2019-04-23 – 2019-04-24 (×2): 200 mg via ORAL
  Filled 2019-04-22 (×2): qty 2

## 2019-04-22 MED ORDER — ASPIRIN EC 325 MG PO TBEC
325.0000 mg | DELAYED_RELEASE_TABLET | Freq: Every day | ORAL | Status: DC
  Administered 2019-04-23 – 2019-06-07 (×3): 325 mg via ORAL
  Filled 2019-04-22 (×5): qty 1

## 2019-04-22 SURGICAL SUPPLY — 157 items
ADAPTER CARDIO PERF ANTE/RETRO (ADAPTER) ×2 IMPLANT
ADPR PRFSN 84XANTGRD RTRGD (ADAPTER) ×1
AGENT HMST KT MTR STRL THRMB (HEMOSTASIS) ×2
ANCHOR CATH FOLEY SECURE (MISCELLANEOUS) ×6 IMPLANT
APL SWBSTK 6 STRL LF DISP (MISCELLANEOUS) ×1
APPLICATOR COTTON TIP 6 STRL (MISCELLANEOUS) ×1 IMPLANT
APPLICATOR COTTON TIP 6IN STRL (MISCELLANEOUS) ×3
BAG DECANTER FOR FLEXI CONT (MISCELLANEOUS) ×2 IMPLANT
BASKET HEART  (ORDER IN 25'S) (MISCELLANEOUS) ×1
BASKET HEART (ORDER IN 25'S) (MISCELLANEOUS) ×2
BASKET HEART (ORDER IN 25S) (MISCELLANEOUS) ×1 IMPLANT
BLADE CLIPPER SURG (BLADE) ×3 IMPLANT
BLADE MINI RND TIP GREEN BEAV (BLADE) ×2 IMPLANT
BLADE STERNUM SYSTEM 6 (BLADE) ×5 IMPLANT
BLADE SURG 11 STRL SS (BLADE) ×2 IMPLANT
BLADE SURG 12 STRL SS (BLADE) ×3 IMPLANT
BLADE SURG 15 STRL LF DISP TIS (BLADE) ×1 IMPLANT
BLADE SURG 15 STRL SS (BLADE) ×3
BNDG CMPR MED 10X6 ELC LF (GAUZE/BANDAGES/DRESSINGS) ×1
BNDG ELASTIC 4X5.8 VLCR STR LF (GAUZE/BANDAGES/DRESSINGS) ×3 IMPLANT
BNDG ELASTIC 6X10 VLCR STRL LF (GAUZE/BANDAGES/DRESSINGS) ×2 IMPLANT
BNDG ELASTIC 6X5.8 VLCR STR LF (GAUZE/BANDAGES/DRESSINGS) ×3 IMPLANT
BNDG GAUZE ELAST 4 BULKY (GAUZE/BANDAGES/DRESSINGS) ×3 IMPLANT
CANISTER SUCT 3000ML PPV (MISCELLANEOUS) ×6 IMPLANT
CANN PRFSN 3/8XRT ANG TPR 14 (MISCELLANEOUS) ×1
CANNULA AORTIC ROOT 9FR (CANNULA) ×3 IMPLANT
CANNULA ARTERIAL NVNT 3/8 20FR (MISCELLANEOUS) ×2 IMPLANT
CANNULA GUNDRY RCSP 15FR (MISCELLANEOUS) ×6 IMPLANT
CANNULA PRFSN 3/8XRT ANG TPR14 (MISCELLANEOUS) IMPLANT
CANNULA SUMP PERICARDIAL (CANNULA) ×3 IMPLANT
CANNULA VEN MTL TIP RT (MISCELLANEOUS) ×3
CANNULA VRC MALB SNGL STG 28FR (MISCELLANEOUS) IMPLANT
CATH CPB KIT VANTRIGT (MISCELLANEOUS) ×3 IMPLANT
CATH RETROPLEGIA CORONARY 14FR (CATHETERS) ×3 IMPLANT
CATH ROBINSON RED A/P 18FR (CATHETERS) ×14 IMPLANT
CATH THORACIC 28FR RT ANG (CATHETERS) ×8 IMPLANT
CATH THORACIC 36FR (CATHETERS) ×3 IMPLANT
CLIP FOGARTY SPRING 6M (CLIP) ×3 IMPLANT
CLIP VESOCCLUDE SM WIDE 24/CT (CLIP) ×4 IMPLANT
CONN 1/2X1/2X1/2  BEN (MISCELLANEOUS) ×6
CONN 1/2X1/2X1/2 BEN (MISCELLANEOUS) ×1 IMPLANT
CONN 3/8X1/2 ST GISH (MISCELLANEOUS) ×10 IMPLANT
COVER SURGICAL LIGHT HANDLE (MISCELLANEOUS) ×4 IMPLANT
DEFOGGER ANTIFOG KIT (MISCELLANEOUS) ×2 IMPLANT
DRAIN CHANNEL 32F RND 10.7 FF (WOUND CARE) ×3 IMPLANT
DRAPE CARDIOVASCULAR INCISE (DRAPES) ×6
DRAPE SLUSH/WARMER DISC (DRAPES) ×5 IMPLANT
DRAPE SRG 135X102X78XABS (DRAPES) ×1 IMPLANT
DRSG AQUACEL AG ADV 3.5X14 (GAUZE/BANDAGES/DRESSINGS) ×4 IMPLANT
DRSG TEGADERM 4X4.75 (GAUZE/BANDAGES/DRESSINGS) ×10 IMPLANT
ELECT BLADE 4.0 EZ CLEAN MEGAD (MISCELLANEOUS) ×3
ELECT BLADE 6.5 EXT (BLADE) ×3 IMPLANT
ELECT CAUTERY BLADE 6.4 (BLADE) ×6 IMPLANT
ELECT REM PT RETURN 9FT ADLT (ELECTROSURGICAL) ×12
ELECTRODE BLDE 4.0 EZ CLN MEGD (MISCELLANEOUS) ×1 IMPLANT
ELECTRODE REM PT RTRN 9FT ADLT (ELECTROSURGICAL) ×4 IMPLANT
FELT TEFLON 1X6 (MISCELLANEOUS) ×8 IMPLANT
GAUZE SPONGE 4X4 12PLY STRL (GAUZE/BANDAGES/DRESSINGS) ×8 IMPLANT
GAUZE SPONGE 4X4 12PLY STRL LF (GAUZE/BANDAGES/DRESSINGS) ×4 IMPLANT
GAUZE XEROFORM 5X9 LF (GAUZE/BANDAGES/DRESSINGS) ×2 IMPLANT
GLOVE BIO SURGEON STRL SZ 6 (GLOVE) ×8 IMPLANT
GLOVE BIO SURGEON STRL SZ 6.5 (GLOVE) ×4 IMPLANT
GLOVE BIO SURGEON STRL SZ7.5 (GLOVE) ×9 IMPLANT
GLOVE BIO SURGEONS STRL SZ 6.5 (GLOVE) ×4
GLOVE BIOGEL PI IND STRL 7.0 (GLOVE) IMPLANT
GLOVE BIOGEL PI IND STRL 7.5 (GLOVE) IMPLANT
GLOVE BIOGEL PI IND STRL 8.5 (GLOVE) IMPLANT
GLOVE BIOGEL PI INDICATOR 7.0 (GLOVE) ×2
GLOVE BIOGEL PI INDICATOR 7.5 (GLOVE) ×6
GLOVE BIOGEL PI INDICATOR 8.5 (GLOVE) ×4
GLOVE SURG SS PI 6.0 STRL IVOR (GLOVE) ×6 IMPLANT
GLOVE SURG SS PI 7.0 STRL IVOR (GLOVE) ×10 IMPLANT
GOWN STRL REUS W/ TWL LRG LVL3 (GOWN DISPOSABLE) ×8 IMPLANT
GOWN STRL REUS W/ TWL XL LVL3 (GOWN DISPOSABLE) ×1 IMPLANT
GOWN STRL REUS W/TWL LRG LVL3 (GOWN DISPOSABLE) ×39
GOWN STRL REUS W/TWL XL LVL3 (GOWN DISPOSABLE)
HEMOSTAT POWDER SURGIFOAM 1G (HEMOSTASIS) ×12 IMPLANT
HEMOSTAT SURGICEL 2X14 (HEMOSTASIS) ×6 IMPLANT
INSERT FOGARTY XLG (MISCELLANEOUS) IMPLANT
KIT BASIN OR (CUSTOM PROCEDURE TRAY) ×6 IMPLANT
KIT REMOVER STAPLE SKIN (MISCELLANEOUS) ×2 IMPLANT
KIT SUCTION CATH 14FR (SUCTIONS) ×6 IMPLANT
KIT TURNOVER KIT B (KITS) ×6 IMPLANT
KIT VASOVIEW HEMOPRO 2 VH 4000 (KITS) ×3 IMPLANT
LEAD PACING MYOCARDI (MISCELLANEOUS) ×3 IMPLANT
LINE VENT (MISCELLANEOUS) ×2 IMPLANT
LOOP VESSEL SUPERMAXI WHITE (MISCELLANEOUS) ×3 IMPLANT
MARKER GRAFT CORONARY BYPASS (MISCELLANEOUS) ×13 IMPLANT
NS IRRIG 1000ML POUR BTL (IV SOLUTION) ×34 IMPLANT
PACK E OPEN HEART (SUTURE) ×3 IMPLANT
PACK OPEN HEART (CUSTOM PROCEDURE TRAY) ×6 IMPLANT
PAD ARMBOARD 7.5X6 YLW CONV (MISCELLANEOUS) ×10 IMPLANT
PAD ELECT DEFIB RADIOL ZOLL (MISCELLANEOUS) ×3 IMPLANT
PENCIL BUTTON HOLSTER BLD 10FT (ELECTRODE) ×5 IMPLANT
POSITIONER HEAD DONUT 9IN (MISCELLANEOUS) ×6 IMPLANT
PUNCH AORTIC ROTATE 4.0MM (MISCELLANEOUS) IMPLANT
PUNCH AORTIC ROTATE 4.5MM 8IN (MISCELLANEOUS) ×2 IMPLANT
PUNCH AORTIC ROTATE 5MM 8IN (MISCELLANEOUS) IMPLANT
RING ANLPLS CARP-EDW PHY II 26 (Prosthesis & Implant Heart) IMPLANT
RING ANNULOPLASTY PHY II (Prosthesis & Implant Heart) ×3 IMPLANT
SET CARDIOPLEGIA MPS 5001102 (MISCELLANEOUS) ×2 IMPLANT
SPONGE LAP 18X18 RF (DISPOSABLE) ×8 IMPLANT
SPONGE LAP 4X18 RFD (DISPOSABLE) ×4 IMPLANT
SURGIFLO W/THROMBIN 8M KIT (HEMOSTASIS) ×5 IMPLANT
SUT BONE WAX W31G (SUTURE) ×6 IMPLANT
SUT ETHIBOND (SUTURE) ×4 IMPLANT
SUT ETHIBOND 2 0 SH (SUTURE) ×11 IMPLANT
SUT ETHIBOND 2 0 SH 36X2 (SUTURE) ×1 IMPLANT
SUT ETHIBOND 2 0 V4 (SUTURE) IMPLANT
SUT ETHIBOND 2 0V4 GREEN (SUTURE) IMPLANT
SUT ETHIBOND 2-0 RB-1 WHT (SUTURE) ×4 IMPLANT
SUT ETHIBOND 4 0 RB 1 (SUTURE) ×4 IMPLANT
SUT ETHIBOND 4 0 TF (SUTURE) ×3 IMPLANT
SUT ETHIBOND 5 0 C 1 30 (SUTURE) ×3 IMPLANT
SUT MNCRL AB 4-0 PS2 18 (SUTURE) ×4 IMPLANT
SUT PROLENE 3 0 SH 1 (SUTURE) ×3 IMPLANT
SUT PROLENE 3 0 SH DA (SUTURE) ×9 IMPLANT
SUT PROLENE 3 0 SH1 36 (SUTURE) ×9 IMPLANT
SUT PROLENE 4 0 RB 1 (SUTURE) ×21
SUT PROLENE 4 0 SH DA (SUTURE) ×7 IMPLANT
SUT PROLENE 4-0 RB1 .5 CRCL 36 (SUTURE) ×3 IMPLANT
SUT PROLENE 5 0 C 1 36 (SUTURE) ×7 IMPLANT
SUT PROLENE 6 0 C 1 30 (SUTURE) IMPLANT
SUT PROLENE 6 0 CC (SUTURE) ×9 IMPLANT
SUT PROLENE 8 0 BV175 6 (SUTURE) ×8 IMPLANT
SUT PROLENE BLUE 7 0 (SUTURE) ×5 IMPLANT
SUT PROLENE POLY MONO (SUTURE) ×6 IMPLANT
SUT SILK  1 MH (SUTURE) ×6
SUT SILK 1 MH (SUTURE) ×1 IMPLANT
SUT SILK 1 TIES 10X30 (SUTURE) ×5 IMPLANT
SUT SILK 2 0 (SUTURE) ×3
SUT SILK 2 0 SH CR/8 (SUTURE) ×5 IMPLANT
SUT SILK 2-0 18XBRD TIE 12 (SUTURE) ×1 IMPLANT
SUT SILK 3 0 SH CR/8 (SUTURE) ×3 IMPLANT
SUT SILK 4 0 (SUTURE) ×3
SUT SILK 4-0 18XBRD TIE 12 (SUTURE) ×1 IMPLANT
SUT STEEL 6MS V (SUTURE) ×5 IMPLANT
SUT STEEL SZ 6 DBL 3X14 BALL (SUTURE) ×3 IMPLANT
SUT TEM PAC WIRE 2 0 SH (SUTURE) ×3 IMPLANT
SUT VIC AB 1 CTX 18 (SUTURE) ×3 IMPLANT
SUT VIC AB 1 CTX 36 (SUTURE) ×15
SUT VIC AB 1 CTX36XBRD ANBCTR (SUTURE) ×4 IMPLANT
SUT VIC AB 2-0 CT1 27 (SUTURE) ×6
SUT VIC AB 2-0 CT1 TAPERPNT 27 (SUTURE) IMPLANT
SUT VIC AB 2-0 CTX 27 (SUTURE) ×3 IMPLANT
SUT VIC AB 3-0 X1 27 (SUTURE) ×3 IMPLANT
SYR 30ML SLIP (SYRINGE) ×2 IMPLANT
SYSTEM SAHARA CHEST DRAIN ATS (WOUND CARE) ×8 IMPLANT
TAPE CLOTH SURG 4X10 WHT LF (GAUZE/BANDAGES/DRESSINGS) ×4 IMPLANT
TAPE PAPER 2X10 WHT MICROPORE (GAUZE/BANDAGES/DRESSINGS) ×2 IMPLANT
TOWEL GREEN STERILE (TOWEL DISPOSABLE) ×6 IMPLANT
TOWEL GREEN STERILE FF (TOWEL DISPOSABLE) ×6 IMPLANT
TRAY FOLEY SLVR 16FR TEMP STAT (SET/KITS/TRAYS/PACK) ×6 IMPLANT
TUBING LAP HI FLOW INSUFFLATIO (TUBING) ×3 IMPLANT
UNDERPAD 30X30 (UNDERPADS AND DIAPERS) ×6 IMPLANT
VRC MALLEABLE SINGLE STG 28FR (MISCELLANEOUS) ×3
WATER STERILE IRR 1000ML POUR (IV SOLUTION) ×6 IMPLANT

## 2019-04-22 NOTE — Progress Notes (Signed)
Discontinued Left pleural chest tube site dressing saturated but not dripping. New pressure dressing applied.

## 2019-04-22 NOTE — Progress Notes (Signed)
Pre Procedure note for inpatients:   Brendan [name] has been scheduled for Procedure(s) with comments: CORONARY ARTERY BYPASS GRAFTING (CABG) x3 (N/A) - Inhaled Nitric-Oxide MITRAL VALVE (MV) REPLACEMENT repair vs. replacement (N/A) today. The various methods of treatment have been discussed with the patient. After consideration of the risks, benefits and treatment options the patient has consented to the planned procedure.   The patient has been seen and labs reviewed. There are no changes in the patient's condition to prevent proceeding with the planned procedure today.  Recent labs:  Lab Results  Component Value Date   WBC 11.1 (H) 04/24/2019   HGB 7.4 (L) 04/26/2019   HCT 22.3 (L) 04/21/2019   PLT 114 (L) 05/13/2019   GLUCOSE 169 (H) 04/30/2019   CHOL 174 08/21/2017   TRIG 143 08/21/2017   HDL 57 08/21/2017   LDLCALC 88 08/21/2017   ALT 38 04/21/2019   AST 44 (H) 04/21/2019   NA 131 (L) 05/07/2019   K 3.2 (L) 05/06/2019   CL 96 (L) 04/24/2019   CREATININE 1.15 05/11/2019   BUN 22 05/16/2019   CO2 25 04/26/2019   TSH 0.925 03/30/2019   INR 1.3 (H) 03/19/2019   HGBA1C 9.0 (H) 04/12/2019   MICROALBUR 30 08/21/2017    Len Childs, MD 05/10/2019 7:41 AM

## 2019-04-22 NOTE — Progress Notes (Signed)
  Echocardiogram Echocardiogram Transesophageal has been performed.  Meridee Branum A Davyd Podgorski 04/23/2019, 8:58 AM

## 2019-04-22 NOTE — Progress Notes (Signed)
TCTS BRIEF SICU PROGRESS NOTE  Day of Surgery  S/P Procedure(s) (LRB): CORONARY ARTERY BYPASS GRAFTING (CABG) x3, USING LEFT INTERNAL MAMMARY ARTERY AND LEFT LEG GREATER SAPHENOUS VEIN HARVESTED ENDOSCOPICALLY (N/A) MITRAL VALVE (MV) REPAIR USING PHYSIO II RING SIZE 26MM (N/A)   Sedated on vent Paced rhythm w/ stable hemodynamics C.I. > 3 and Impella flows stable 3.5 L/min on P5 Levo 10 Epi 2 Milrinone 0.25 Nitric 20 O2 sats 100% on 50% FiO2 Chest tube output trending down UOP excellent Labs okay  Plan: Continue current plan.  Keep intubated overnight.  Wean nitric in am if stable  Rexene Alberts, MD 04/21/2019 6:25 PM

## 2019-04-22 NOTE — Brief Op Note (Signed)
03/26/2019 - 05/14/2019  8:51 AM  PATIENT:  Brendan [name]  64 y.o. male  PRE-OPERATIVE DIAGNOSIS:  Coronary Artery Disease, Mitral Regurgitation  POST-OPERATIVE DIAGNOSIS:  Coronary Artery Disease, Mitral Regurgitation  PROCEDURE:  Procedure(s) with comments: CORONARY ARTERY BYPASS GRAFTING (CABG) x3, USING LEFT INTERNAL MAMMARY ARTERY AND LEFT LEG GREATER SAPHENOUS VEIN HARVESTED ENDOSCOPICALLY (N/A) - Inhaled Nitric-Oxide MITRAL VALVE (MV) REPAIR USING PHYSIO II RING SIZE 26MM (N/A)  SURGEON:  Surgeon(s) and Role:    Ivin Poot, MD - Primary  PHYSICIAN ASSISTANT:   Nicholes Rough, PA-C  ANESTHESIA:   general  EBL:  1000 mL   BLOOD ADMINISTERED:none  DRAINS:  ROUTINE    LOCAL MEDICATIONS USED:  NONE  SPECIMEN:  No Specimen  DISPOSITION OF SPECIMEN:  N/A  COUNTS:  YES  DICTATION: .Dragon Dictation  PLAN OF CARE: Admit to inpatient   PATIENT DISPOSITION:  ICU - intubated and hemodynamically stable.   Delay start of Pharmacological VTE agent (>24hrs) due to surgical blood loss or risk of bleeding: yes

## 2019-04-22 NOTE — Transfer of Care (Signed)
Immediate Anesthesia Transfer of Care Note  Patient: Brendan [name]  Procedure(s) Performed: CORONARY ARTERY BYPASS GRAFTING (CABG) x3, USING LEFT INTERNAL MAMMARY ARTERY AND LEFT LEG GREATER SAPHENOUS VEIN HARVESTED ENDOSCOPICALLY (N/A Chest) MITRAL VALVE (MV) REPAIR USING PHYSIO II RING SIZE 26MM (N/A Chest)  Patient Location: SICU  Anesthesia Type:General  Level of Consciousness: sedated and Patient remains intubated per anesthesia plan  Airway & Oxygen Therapy: Patient remains intubated per anesthesia plan and Patient placed on Ventilator (see vital sign flow sheet for setting)  Post-op Assessment: Report given to RN and Post -op Vital signs reviewed and stable  Post vital signs: Reviewed and stable  Last Vitals:  Vitals Value Taken Time  BP 87/50 05/06/2019 1654  Temp 36.5 C 04/24/2019 1655  Pulse 89 04/29/2019 1655  Resp 21 05/15/2019 1655  SpO2 100 % 04/24/2019 1655  Vitals shown include unvalidated device data.  Last Pain:  Vitals:   05/04/2019 0500  TempSrc:   PainSc: 6       Patients Stated Pain Goal: 0 (29/93/71 6967)  Complications: No apparent anesthesia complications  893/81, 90 paced, 100% vent

## 2019-04-22 NOTE — Anesthesia Procedure Notes (Signed)
Arterial Line Insertion Start/End04/25/2021 8:05 AM, 04/21/2019 8:06 AM Performed by: Verdie Drown, CRNA, CRNA  Patient location: OR. Preanesthetic checklist: patient identified, IV checked, site marked, risks and benefits discussed, surgical consent, monitors and equipment checked, pre-op evaluation, timeout performed and anesthesia consent Patient sedated Left, radial was placed Catheter size: 20 G Hand hygiene performed  and maximum sterile barriers used   Attempts: 1 Procedure performed without using ultrasound guided technique. Following insertion, dressing applied and Biopatch. Post procedure assessment: normal  Patient tolerated the procedure well with no immediate complications. Additional procedure comments: L) radial aline in place from ICU.  Per Dr Darcey Nora request aline exchanged over arrow wire with new 20g aline.Marland Kitchen

## 2019-04-22 NOTE — Progress Notes (Signed)
Advanced Heart Failure Rounding Note  PCP-Cardiologist: No primary care provider on file.   Subjective:    Events - Presented to [facility] with acute HF  EF 20-25% - Transferred to con - Cath 3/30 with severe 3 vessel disease. EF 25% - Developed PMVT on milrinone -> milrinone stopped -> developed worsening shock -> IABP placed on 4/1 - Clinical deterioration 4/1-> intubated - 4/2 underwent Impella 5.5 placement earlier and bilateral chest tubes with 3L out from each side.  - Extubated 4/4 - 05/07/2019 CABG and MVR  Underwent CABG and MVRepair today. Remains intubated. Sedated   On NE 5, Epi 3 milrinone 0.25 and iNO.   Impella 5.5 on P-5 with good flow (3.5L) and waveforms  Hemodynamics CVP 9-10 PA 33/11 (23)  Thermo 6.6/3.5   Objective:   Weight Range: 68.3 kg Body mass index is 21 kg/m.   Vital Signs:   Temp:  [97.7 F (36.5 C)-98.8 F (37.1 C)] 98.4 F (36.9 C) (04/06 1930) Pulse Rate:  [74-92] 89 (04/06 1930) Resp:  [0-24] 20 (04/06 1930) BP: (87-108)/(50-74) 89/66 (04/06 1745) SpO2:  [93 %-100 %] 100 % (04/06 1941) Arterial Line BP: (92-121)/(48-68) 120/53 (04/06 1930) FiO2 (%):  [40 %-50 %] 50 % (04/06 1941) Weight:  [68.3 kg] 68.3 kg (04/06 0443) Last BM Date: 04/20/19  Weight change: Filed Weights   04/20/19 0400 04/21/19 0600 05/05/2019 0443  Weight: 67.3 kg 67.7 kg 68.3 kg    Intake/Output:   Intake/Output Summary (Last 24 hours) at 05/15/2019 1949 Last data filed at 05/04/2019 1900 Gross per 24 hour  Intake 6436.72 ml  Output 2825 ml  Net 3611.72 ml      Physical Exam   General:  Intubated/sedated HEENT: normal + ETT Neck: supple. RIJ swan Carotids 2+ bilat; no bruits. No lymphadenopathy or thryomegaly appreciated. Cor: Sternal dressing & impella site ok PMI nondisplaced. Regular rate & rhythm. No rubs, gallops or murmurs. + CTs with mild drainage Lungs: coarse Abdomen: soft, nontender, nondistended. No hepatosplenomegaly. No bruits or  masses. Quiet. Extremities: no cyanosis, clubbing, rash, trace edema  LLE wrapped Neuro: intubated sedate    Telemetry   SR 90s Personally reviewed  Labs    CBC Recent Labs    04/21/19 0344 04/21/19 0504 05/08/2019 0402 05/10/2019 0402 04/28/2019 1408 05/05/2019 1642  WBC 18.4*   < > 11.1*  --   --  11.1*  NEUTROABS 15.7*  --  8.6*  --   --   --   HGB 9.1*   < > 7.4*   < > 7.8* 8.5*  HCT 26.9*   < > 22.3*   < > 23.2* 24.9*  MCV 85.1   < > 85.4  --   --  84.4  PLT 144*   < > 114*   < > 63* 66*   < > = values in this interval not displayed.   Basic Metabolic Panel Recent Labs    04/21/19 0344 04/21/19 0344 04/21/19 0504 04/20/2019 0402  NA 133*   < > 131* 131*  K 3.6   < > 3.7 3.2*  CL 97*  --   --  96*  CO2 27  --   --  25  GLUCOSE 150*  --   --  169*  BUN 27*  --   --  22  CREATININE 1.15  --   --  1.15  CALCIUM 7.7*  --   --  7.9*  MG 2.4  --   --  2.4   < > = values in this interval not displayed.   Liver Function Tests Recent Labs    04/21/19 0925  AST 44*  ALT 38  ALKPHOS 88  BILITOT 0.6  PROT 4.9*  ALBUMIN 1.7*   No results for input(s): LIPASE, AMYLASE in the last 72 hours. Cardiac Enzymes No results for input(s): CKTOTAL, CKMB, CKMBINDEX, TROPONINI in the last 72 hours.  BNP: BNP (last 3 results) Recent Labs    03/17/2019 0113  BNP 655.7*    ProBNP (last 3 results) No results for input(s): PROBNP in the last 8760 hours.   D-Dimer No results for input(s): DDIMER in the last 72 hours. Hemoglobin A1C No results for input(s): HGBA1C in the last 72 hours. Fasting Lipid Panel No results for input(s): CHOL, HDL, LDLCALC, TRIG, CHOLHDL, LDLDIRECT in the last 72 hours. Thyroid Function Tests No results for input(s): TSH, T4TOTAL, T3FREE, THYROIDAB in the last 72 hours.  Invalid input(s): FREET3  Other results:   Imaging    DG Chest Portable 1 View  Result Date: 04/23/2019 CLINICAL DATA:  64 year old male status post open heart surgery.  Line placement. EXAM: PORTABLE CHEST 1 VIEW COMPARISON:  Earlier radiograph dated 04/23/2019. FINDINGS: There has been interval advancement of the Swan-Ganz catheter with tip projecting over the left ventricle likely in the left lower lobe pulmonary artery. Endotracheal tube remains above the carina. Bilateral chest tubes and mediastinal drain noted. Right subclavian Impella device as seen on the earlier radiograph. Right-sided PICC is also in similar position. There has been interval placement of an additional support apparatus with tip projecting over the mediastinum. No significant interval change in the appearance of the lungs or cardiomediastinal silhouette. Median sternotomy wires, CABG vascular clips, and cardiac valve repair. IMPRESSION: 1. Interval advancement of the Swan-Ganz catheter with tip projecting over the left ventricle likely in the left lower lobe pulmonary artery. Clinical correlation is recommended. 2. Additional support apparatus as above. No other interval change in the appearance of the lungs or cardiomediastinal silhouette since the earlier radiograph. Electronically Signed   By: Anner Crete M.D.   On: 05/14/2019 16:57   DG Chest Portable 1 View  Result Date: 04/20/2019 CLINICAL DATA:  Swan-Ganz adjustment status post cardiac surgery. EXAM: PORTABLE CHEST 1 VIEW COMPARISON:  Radiograph of same day. FINDINGS: Stable cardiomediastinal silhouette. Endotracheal tube is in grossly good position. Stable position of Impella device. Bilateral chest tubes are noted without pneumothorax. Left lung is clear. Mild right basilar subsegmental atelectasis is noted. Right internal jugular Swan-Ganz catheter is noted with tip in expected position of proximal left pulmonary artery; it has been withdrawn partially since prior exam. Bony thorax is unremarkable. IMPRESSION: Stable position of Impella device. Bilateral chest tubes are noted without pneumothorax. Right internal jugular Swan-Ganz catheter  has been withdrawn partially since prior exam with tip in expected position of proximal left pulmonary artery. Stable right basilar subsegmental atelectasis. Electronically Signed   By: Marijo Conception M.D.   On: 04/20/2019 16:49     Medications:     Scheduled Medications: . [START ON 04/23/2019] aspirin EC  325 mg Oral Daily   Or  . [START ON 04/23/2019] aspirin  324 mg Per Tube Daily  . [START ON 04/23/2019] bisacodyl  10 mg Oral Daily   Or  . [START ON 04/23/2019] bisacodyl  10 mg Rectal Daily  . Chlorhexidine Gluconate Cloth  6 each Topical Daily  . [START ON 04/23/2019] docusate sodium  200 mg Oral Daily  . [  START ON 04/23/2019] gabapentin  100 mg Oral TID  . metoCLOPramide (REGLAN) injection  10 mg Intravenous Q6H  . metoprolol tartrate  12.5 mg Oral BID   Or  . metoprolol tartrate  12.5 mg Per Tube BID  . [START ON 04/24/2019] pantoprazole  40 mg Oral Daily  . pneumococcal 23 valent vaccine  0.5 mL Intramuscular Tomorrow-1000  . [START ON 04/23/2019] rosuvastatin  40 mg Oral q1800  . [START ON 04/23/2019] sodium chloride flush  3 mL Intravenous Q12H    Infusions: . sodium chloride Stopped (04/29/2019 1852)  . [START ON 04/23/2019] sodium chloride    . sodium chloride 20 mL/hr at 04/24/2019 1840  . albumin human 60 mL/hr at 04/21/2019 1900  . amiodarone 30 mg/hr (05/12/2019 1900)  . cefUROXime (ZINACEF)  IV    . desmopressin (DDAVP) IV    . dexmedetomidine (PRECEDEX) IV infusion 0.7 mcg/kg/hr (04/28/2019 1900)  . epinephrine 3 mcg/min (05/03/2019 1900)  . famotidine (PEPCID) IV    . insulin Stopped (05/01/2019 1726)  . lactated ringers    . lactated ringers Stopped (05/01/2019 1640)  . lactated ringers 20 mL/hr at 05/01/2019 1900  . magnesium sulfate 20 mL/hr at 05/03/2019 1900  . milrinone 0.25 mcg/kg/min (05/07/2019 1900)  . norepinephrine (LEVOPHED) Adult infusion Stopped (04/30/2019 1859)  . potassium chloride 10 mEq (04/27/2019 1852)  . vancomycin      PRN Medications: sodium chloride, albumin human,  dextrose, lactated ringers, metoprolol tartrate, midazolam, morphine injection, ondansetron (ZOFRAN) IV, oxyCODONE, [START ON 04/23/2019] sodium chloride flush, traMADol     Assessment/Plan    1.Acute systolic HF ->  Cardiogenic Shock  - Due to iCM. EF 20-25% - Impella 5.5 placed on 4/2.   - s/p CABG/MVRepair on 4/6 (now immediately post-op) - doing well with Impella support and low-dose NE, epi and milrinone - Swan numbers look good - Wean drip as tolerated. Work toward extubation - Continue impella support  - likely start diuresis in am   2. CAD - LHC with severe 3 vessel CAD  - s/p CABG x 3 MVRepair 4/6   3. Acute hypoxic respiratory failure - intubated 4/1. Extubated 4/4 - Remains intubated post CABG MVR earlier today - work toward intubation   4. Mitral Regurgitation - Mod-severe on ECHO - s/p MVrepair today   5. Uncontrolled DM -Hgb A1C 9.  - On insulin and sliding scale.   6. AKI - due to shock - resolved with hemodynamic supprot   7. Acute blood loss Anemia  -transfuse as need to keep Hgb > 7.5  8. 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   -Keep  Mag >2   9. Neuropathy -Very limited with severe neuropathy. No sensation R and L foot and occasionaly in his hands. Requires assistance with ADLs.   10. Severe Malnutrition -Prealbumin 8.9  -Nutrition on board - Continue TFs  11. Hematuria  - due to Foley trauma. Resolved.    CRITICAL CARE Performed by: Glori Bickers  Total critical care time: 35 minutes  Critical care time was exclusive of separately billable procedures and treating other patients.  Critical care was necessary to treat or prevent imminent or life-threatening deterioration.  Critical care was time spent personally by me (independent of midlevel providers or residents) on the following activities: development of treatment plan with patient and/or surrogate as well as nursing, discussions with consultants, evaluation of  patient's response to treatment, examination of patient, obtaining history from patient or surrogate, ordering and performing  treatments and interventions, ordering and review of laboratory studies, ordering and review of radiographic studies, pulse oximetry and re-evaluation of patient's condition.    Length of Stay: [place], MD  05/09/2019, 7:49 PM  Advanced Heart Failure Team Pager [phone] (M-F; 7a - 4p)  Please contact [facility] for night-coverage after hours (4p -7a ) and weekends on [email or web address]

## 2019-04-22 NOTE — OR Nursing (Signed)
Called 2 Heart charge nurse at [phone] for 20 minute call.

## 2019-04-23 ENCOUNTER — Encounter: Payer: Self-pay | Admitting: *Deleted

## 2019-04-23 ENCOUNTER — Inpatient Hospital Stay (HOSPITAL_COMMUNITY): Payer: Medicaid Other

## 2019-04-23 DIAGNOSIS — Z95811 Presence of heart assist device: Secondary | ICD-10-CM

## 2019-04-23 LAB — POCT I-STAT 7, (LYTES, BLD GAS, ICA,H+H)
Acid-Base Excess: 1 mmol/L (ref 0.0–2.0)
Acid-Base Excess: 1 mmol/L (ref 0.0–2.0)
Acid-base deficit: 1 mmol/L (ref 0.0–2.0)
Acid-base deficit: 1 mmol/L (ref 0.0–2.0)
Acid-base deficit: 1 mmol/L (ref 0.0–2.0)
Acid-base deficit: 1 mmol/L (ref 0.0–2.0)
Acid-base deficit: 1 mmol/L (ref 0.0–2.0)
Acid-base deficit: 1 mmol/L (ref 0.0–2.0)
Bicarbonate: 25.6 mmol/L (ref 20.0–28.0)
Bicarbonate: 24.2 mmol/L (ref 20.0–28.0)
Bicarbonate: 24.5 mmol/L (ref 20.0–28.0)
Bicarbonate: 25.1 mmol/L (ref 20.0–28.0)
Bicarbonate: 24.4 mmol/L (ref 20.0–28.0)
Bicarbonate: 22.8 mmol/L (ref 20.0–28.0)
Bicarbonate: 23.2 mmol/L (ref 20.0–28.0)
Bicarbonate: 22.8 mmol/L (ref 20.0–28.0)
Bicarbonate: 23.4 mmol/L (ref 20.0–28.0)
Calcium, Ion: 1.2 mmol/L (ref 1.15–1.40)
Calcium, Ion: 1.17 mmol/L (ref 1.15–1.40)
Calcium, Ion: 0.92 mmol/L — ABNORMAL LOW (ref 1.15–1.40)
Calcium, Ion: 1.45 mmol/L — ABNORMAL HIGH (ref 1.15–1.40)
Calcium, Ion: 1.19 mmol/L (ref 1.15–1.40)
Calcium, Ion: 1.16 mmol/L (ref 1.15–1.40)
Calcium, Ion: 1.17 mmol/L (ref 1.15–1.40)
Calcium, Ion: 1.16 mmol/L (ref 1.15–1.40)
Calcium, Ion: 1.15 mmol/L (ref 1.15–1.40)
HCT: 19 % — ABNORMAL LOW (ref 39.0–52.0)
HCT: 28 % — ABNORMAL LOW (ref 39.0–52.0)
HCT: 23 % — ABNORMAL LOW (ref 39.0–52.0)
HCT: 39 % (ref 39.0–52.0)
HCT: 26 % — ABNORMAL LOW (ref 39.0–52.0)
HCT: 24 % — ABNORMAL LOW (ref 39.0–52.0)
HCT: 27 % — ABNORMAL LOW (ref 39.0–52.0)
HCT: 26 % — ABNORMAL LOW (ref 39.0–52.0)
HCT: 26 % — ABNORMAL LOW (ref 39.0–52.0)
Hemoglobin: 6.5 g/dL — CL (ref 13.0–17.0)
Hemoglobin: 9.5 g/dL — ABNORMAL LOW (ref 13.0–17.0)
Hemoglobin: 7.8 g/dL — ABNORMAL LOW (ref 13.0–17.0)
Hemoglobin: 13.3 g/dL (ref 13.0–17.0)
Hemoglobin: 8.8 g/dL — ABNORMAL LOW (ref 13.0–17.0)
Hemoglobin: 8.2 g/dL — ABNORMAL LOW (ref 13.0–17.0)
Hemoglobin: 9.2 g/dL — ABNORMAL LOW (ref 13.0–17.0)
Hemoglobin: 8.8 g/dL — ABNORMAL LOW (ref 13.0–17.0)
Hemoglobin: 8.8 g/dL — ABNORMAL LOW (ref 13.0–17.0)
O2 Saturation: 100 %
O2 Saturation: 99 %
O2 Saturation: 100 %
O2 Saturation: 100 %
O2 Saturation: 97 %
O2 Saturation: 94 %
O2 Saturation: 96 %
O2 Saturation: 87 %
O2 Saturation: 98 %
Patient temperature: 37.5
Patient temperature: 37.8
Patient temperature: 37.8
Patient temperature: 37.6
Patient temperature: 37.4
Potassium: 3.3 mmol/L — ABNORMAL LOW (ref 3.5–5.1)
Potassium: 3.6 mmol/L (ref 3.5–5.1)
Potassium: 4 mmol/L (ref 3.5–5.1)
Potassium: 3.6 mmol/L (ref 3.5–5.1)
Potassium: 4.5 mmol/L (ref 3.5–5.1)
Potassium: 4.5 mmol/L (ref 3.5–5.1)
Potassium: 4.6 mmol/L (ref 3.5–5.1)
Potassium: 4.3 mmol/L (ref 3.5–5.1)
Potassium: 4.4 mmol/L (ref 3.5–5.1)
Sodium: 133 mmol/L — ABNORMAL LOW (ref 135–145)
Sodium: 133 mmol/L — ABNORMAL LOW (ref 135–145)
Sodium: 135 mmol/L (ref 135–145)
Sodium: 135 mmol/L (ref 135–145)
Sodium: 136 mmol/L (ref 135–145)
Sodium: 136 mmol/L (ref 135–145)
Sodium: 136 mmol/L (ref 135–145)
Sodium: 135 mmol/L (ref 135–145)
Sodium: 135 mmol/L (ref 135–145)
TCO2: 27 mmol/L (ref 22–32)
TCO2: 25 mmol/L (ref 22–32)
TCO2: 26 mmol/L (ref 22–32)
TCO2: 27 mmol/L (ref 22–32)
TCO2: 26 mmol/L (ref 22–32)
TCO2: 24 mmol/L (ref 22–32)
TCO2: 24 mmol/L (ref 22–32)
TCO2: 24 mmol/L (ref 22–32)
TCO2: 25 mmol/L (ref 22–32)
pCO2 arterial: 39.2 mmHg (ref 32.0–48.0)
pCO2 arterial: 43 mmHg (ref 32.0–48.0)
pCO2 arterial: 35.1 mmHg (ref 32.0–48.0)
pCO2 arterial: 47.8 mmHg (ref 32.0–48.0)
pCO2 arterial: 38.4 mmHg (ref 32.0–48.0)
pCO2 arterial: 33.6 mmHg (ref 32.0–48.0)
pCO2 arterial: 35.7 mmHg (ref 32.0–48.0)
pCO2 arterial: 34.6 mmHg (ref 32.0–48.0)
pCO2 arterial: 36.6 mmHg (ref 32.0–48.0)
pH, Arterial: 7.424 (ref 7.350–7.450)
pH, Arterial: 7.358 (ref 7.350–7.450)
pH, Arterial: 7.451 — ABNORMAL HIGH (ref 7.350–7.450)
pH, Arterial: 7.328 — ABNORMAL LOW (ref 7.350–7.450)
pH, Arterial: 7.414 (ref 7.350–7.450)
pH, Arterial: 7.443 (ref 7.350–7.450)
pH, Arterial: 7.425 (ref 7.350–7.450)
pH, Arterial: 7.429 (ref 7.350–7.450)
pH, Arterial: 7.416 (ref 7.350–7.450)
pO2, Arterial: 249 mmHg — ABNORMAL HIGH (ref 83.0–108.0)
pO2, Arterial: 123 mmHg — ABNORMAL HIGH (ref 83.0–108.0)
pO2, Arterial: 359 mmHg — ABNORMAL HIGH (ref 83.0–108.0)
pO2, Arterial: 328 mmHg — ABNORMAL HIGH (ref 83.0–108.0)
pO2, Arterial: 97 mmHg (ref 83.0–108.0)
pO2, Arterial: 69 mmHg — ABNORMAL LOW (ref 83.0–108.0)
pO2, Arterial: 82 mmHg — ABNORMAL LOW (ref 83.0–108.0)
pO2, Arterial: 53 mmHg — ABNORMAL LOW (ref 83.0–108.0)
pO2, Arterial: 98 mmHg (ref 83.0–108.0)

## 2019-04-23 LAB — POCT I-STAT, CHEM 8
BUN: 21 mg/dL (ref 8–23)
BUN: 19 mg/dL (ref 8–23)
BUN: 17 mg/dL (ref 8–23)
BUN: 19 mg/dL (ref 8–23)
BUN: 20 mg/dL (ref 8–23)
BUN: 21 mg/dL (ref 8–23)
Calcium, Ion: 1.21 mmol/L (ref 1.15–1.40)
Calcium, Ion: 1.17 mmol/L (ref 1.15–1.40)
Calcium, Ion: 0.94 mmol/L — ABNORMAL LOW (ref 1.15–1.40)
Calcium, Ion: 1.07 mmol/L — ABNORMAL LOW (ref 1.15–1.40)
Calcium, Ion: 1.09 mmol/L — ABNORMAL LOW (ref 1.15–1.40)
Calcium, Ion: 1.09 mmol/L — ABNORMAL LOW (ref 1.15–1.40)
Chloride: 94 mmol/L — ABNORMAL LOW (ref 98–111)
Chloride: 97 mmol/L — ABNORMAL LOW (ref 98–111)
Chloride: 94 mmol/L — ABNORMAL LOW (ref 98–111)
Chloride: 95 mmol/L — ABNORMAL LOW (ref 98–111)
Chloride: 97 mmol/L — ABNORMAL LOW (ref 98–111)
Chloride: 98 mmol/L (ref 98–111)
Creatinine, Ser: 1 mg/dL (ref 0.61–1.24)
Creatinine, Ser: 0.9 mg/dL (ref 0.61–1.24)
Creatinine, Ser: 0.8 mg/dL (ref 0.61–1.24)
Creatinine, Ser: 0.9 mg/dL (ref 0.61–1.24)
Creatinine, Ser: 1 mg/dL (ref 0.61–1.24)
Creatinine, Ser: 1 mg/dL (ref 0.61–1.24)
Glucose, Bld: 161 mg/dL — ABNORMAL HIGH (ref 70–99)
Glucose, Bld: 182 mg/dL — ABNORMAL HIGH (ref 70–99)
Glucose, Bld: 190 mg/dL — ABNORMAL HIGH (ref 70–99)
Glucose, Bld: 240 mg/dL — ABNORMAL HIGH (ref 70–99)
Glucose, Bld: 226 mg/dL — ABNORMAL HIGH (ref 70–99)
Glucose, Bld: 208 mg/dL — ABNORMAL HIGH (ref 70–99)
HCT: 22 % — ABNORMAL LOW (ref 39.0–52.0)
HCT: 19 % — ABNORMAL LOW (ref 39.0–52.0)
HCT: 20 % — ABNORMAL LOW (ref 39.0–52.0)
HCT: 24 % — ABNORMAL LOW (ref 39.0–52.0)
HCT: 24 % — ABNORMAL LOW (ref 39.0–52.0)
HCT: 20 % — ABNORMAL LOW (ref 39.0–52.0)
Hemoglobin: 7.5 g/dL — ABNORMAL LOW (ref 13.0–17.0)
Hemoglobin: 6.5 g/dL — CL (ref 13.0–17.0)
Hemoglobin: 6.8 g/dL — CL (ref 13.0–17.0)
Hemoglobin: 8.2 g/dL — ABNORMAL LOW (ref 13.0–17.0)
Hemoglobin: 8.2 g/dL — ABNORMAL LOW (ref 13.0–17.0)
Hemoglobin: 6.8 g/dL — CL (ref 13.0–17.0)
Potassium: 3.3 mmol/L — ABNORMAL LOW (ref 3.5–5.1)
Potassium: 3.6 mmol/L (ref 3.5–5.1)
Potassium: 3.9 mmol/L (ref 3.5–5.1)
Potassium: 4.3 mmol/L (ref 3.5–5.1)
Potassium: 4.4 mmol/L (ref 3.5–5.1)
Potassium: 4.3 mmol/L (ref 3.5–5.1)
Sodium: 132 mmol/L — ABNORMAL LOW (ref 135–145)
Sodium: 132 mmol/L — ABNORMAL LOW (ref 135–145)
Sodium: 133 mmol/L — ABNORMAL LOW (ref 135–145)
Sodium: 130 mmol/L — ABNORMAL LOW (ref 135–145)
Sodium: 133 mmol/L — ABNORMAL LOW (ref 135–145)
Sodium: 133 mmol/L — ABNORMAL LOW (ref 135–145)
TCO2: 28 mmol/L (ref 22–32)
TCO2: 25 mmol/L (ref 22–32)
TCO2: 26 mmol/L (ref 22–32)
TCO2: 28 mmol/L (ref 22–32)
TCO2: 28 mmol/L (ref 22–32)
TCO2: 28 mmol/L (ref 22–32)

## 2019-04-23 LAB — BPAM FFP
Blood Product Expiration Date: 202104112359
Blood Product Expiration Date: 202104112359
Blood Product Expiration Date: 202104072359
Blood Product Expiration Date: 202104112359
ISSUE DATE / TIME: 202104061402
ISSUE DATE / TIME: 202104061402
ISSUE DATE / TIME: 202104061807
ISSUE DATE / TIME: 202104061900
Unit Type and Rh: 5100
Unit Type and Rh: 5100
Unit Type and Rh: 6200
Unit Type and Rh: 6200

## 2019-04-23 LAB — GLUCOSE, CAPILLARY
Glucose-Capillary: 85 mg/dL (ref 70–99)
Glucose-Capillary: 120 mg/dL — ABNORMAL HIGH (ref 70–99)
Glucose-Capillary: 112 mg/dL — ABNORMAL HIGH (ref 70–99)
Glucose-Capillary: 99 mg/dL (ref 70–99)
Glucose-Capillary: 95 mg/dL (ref 70–99)
Glucose-Capillary: 97 mg/dL (ref 70–99)
Glucose-Capillary: 88 mg/dL (ref 70–99)
Glucose-Capillary: 97 mg/dL (ref 70–99)
Glucose-Capillary: 100 mg/dL — ABNORMAL HIGH (ref 70–99)
Glucose-Capillary: 99 mg/dL (ref 70–99)
Glucose-Capillary: 115 mg/dL — ABNORMAL HIGH (ref 70–99)
Glucose-Capillary: 120 mg/dL — ABNORMAL HIGH (ref 70–99)
Glucose-Capillary: 127 mg/dL — ABNORMAL HIGH (ref 70–99)
Glucose-Capillary: 127 mg/dL — ABNORMAL HIGH (ref 70–99)
Glucose-Capillary: 117 mg/dL — ABNORMAL HIGH (ref 70–99)
Glucose-Capillary: 121 mg/dL — ABNORMAL HIGH (ref 70–99)
Glucose-Capillary: 118 mg/dL — ABNORMAL HIGH (ref 70–99)
Glucose-Capillary: 128 mg/dL — ABNORMAL HIGH (ref 70–99)

## 2019-04-23 LAB — COOXEMETRY PANEL
Carboxyhemoglobin: 1.5 % (ref 0.5–1.5)
Carboxyhemoglobin: 1.8 % — ABNORMAL HIGH (ref 0.5–1.5)
Methemoglobin: 1.1 % (ref 0.0–1.5)
Methemoglobin: 1.4 % (ref 0.0–1.5)
O2 Saturation: 66.2 %
O2 Saturation: 77.9 %
Total hemoglobin: 9.1 g/dL — ABNORMAL LOW (ref 12.0–16.0)
Total hemoglobin: 9.3 g/dL — ABNORMAL LOW (ref 12.0–16.0)

## 2019-04-23 LAB — ECHO INTRAOPERATIVE TEE
Height: 71 in
Weight: 2409.19 oz

## 2019-04-23 LAB — CBC
HCT: 27.3 % — ABNORMAL LOW (ref 39.0–52.0)
HCT: 27.4 % — ABNORMAL LOW (ref 39.0–52.0)
Hemoglobin: 9.3 g/dL — ABNORMAL LOW (ref 13.0–17.0)
Hemoglobin: 9.2 g/dL — ABNORMAL LOW (ref 13.0–17.0)
MCH: 28.8 pg (ref 26.0–34.0)
MCH: 28.8 pg (ref 26.0–34.0)
MCHC: 34.1 g/dL (ref 30.0–36.0)
MCHC: 33.6 g/dL (ref 30.0–36.0)
MCV: 84.5 fL (ref 80.0–100.0)
MCV: 85.6 fL (ref 80.0–100.0)
Platelets: 93 10*3/uL — ABNORMAL LOW (ref 150–400)
Platelets: 106 10*3/uL — ABNORMAL LOW (ref 150–400)
RBC: 3.23 MIL/uL — ABNORMAL LOW (ref 4.22–5.81)
RBC: 3.2 MIL/uL — ABNORMAL LOW (ref 4.22–5.81)
RDW: 14.2 % (ref 11.5–15.5)
RDW: 14.7 % (ref 11.5–15.5)
WBC: 12.6 10*3/uL — ABNORMAL HIGH (ref 4.0–10.5)
WBC: 18.9 10*3/uL — ABNORMAL HIGH (ref 4.0–10.5)
nRBC: 0 % (ref 0.0–0.2)
nRBC: 0 % (ref 0.0–0.2)

## 2019-04-23 LAB — BASIC METABOLIC PANEL
Anion gap: 9 (ref 5–15)
Anion gap: 9 (ref 5–15)
BUN: 21 mg/dL (ref 8–23)
BUN: 22 mg/dL (ref 8–23)
CO2: 22 mmol/L (ref 22–32)
CO2: 22 mmol/L (ref 22–32)
Calcium: 7.8 mg/dL — ABNORMAL LOW (ref 8.9–10.3)
Calcium: 7.7 mg/dL — ABNORMAL LOW (ref 8.9–10.3)
Chloride: 104 mmol/L (ref 98–111)
Chloride: 104 mmol/L (ref 98–111)
Creatinine, Ser: 1.27 mg/dL — ABNORMAL HIGH (ref 0.61–1.24)
Creatinine, Ser: 1.46 mg/dL — ABNORMAL HIGH (ref 0.61–1.24)
GFR calc Af Amer: >60 mL/min (ref >60)
GFR calc Af Amer: 59 mL/min — ABNORMAL LOW (ref >60)
GFR calc non Af Amer: >60 mL/min (ref >60)
GFR calc non Af Amer: 51 mL/min — ABNORMAL LOW (ref >60)
Glucose, Bld: 99 mg/dL (ref 70–99)
Glucose, Bld: 119 mg/dL — ABNORMAL HIGH (ref 70–99)
Potassium: 4.4 mmol/L (ref 3.5–5.1)
Potassium: 4.4 mmol/L (ref 3.5–5.1)
Sodium: 135 mmol/L (ref 135–145)
Sodium: 135 mmol/L (ref 135–145)

## 2019-04-23 LAB — PREPARE FRESH FROZEN PLASMA
Unit division: 0
Unit division: 0
Unit division: 0

## 2019-04-23 LAB — BPAM CRYOPRECIPITATE
Blood Product Expiration Date: 202104061930
Blood Product Expiration Date: 202104061930
ISSUE DATE / TIME: 202104061356
ISSUE DATE / TIME: 202104061356
Unit Type and Rh: 5100
Unit Type and Rh: 5100

## 2019-04-23 LAB — APTT: aPTT: 46 seconds — ABNORMAL HIGH (ref 24–36)

## 2019-04-23 LAB — PREPARE CRYOPRECIPITATE
Unit division: 0
Unit division: 0

## 2019-04-23 LAB — AEROBIC/ANAEROBIC CULTURE W GRAM STAIN (SURGICAL/DEEP WOUND)
Culture: NO GROWTH
Gram Stain: NONE SEEN

## 2019-04-23 LAB — MAGNESIUM
Magnesium: 3 mg/dL — ABNORMAL HIGH (ref 1.7–2.4)
Magnesium: 2.8 mg/dL — ABNORMAL HIGH (ref 1.7–2.4)

## 2019-04-23 LAB — CALCIUM, IONIZED: Calcium, Ionized, Serum: 4.3 mg/dL — ABNORMAL LOW (ref 4.5–5.6)

## 2019-04-23 MED ORDER — FUROSEMIDE 10 MG/ML IJ SOLN
8.0000 mg/h | INTRAVENOUS | Status: DC
  Administered 2019-04-23 – 2019-04-24 (×2): 8 mg/h via INTRAVENOUS
  Filled 2019-04-23 (×2): qty 25

## 2019-04-23 MED ORDER — INSULIN ASPART 100 UNIT/ML ~~LOC~~ SOLN
0.0000 [IU] | SUBCUTANEOUS | Status: DC
  Administered 2019-04-23 – 2019-04-24 (×2): 1 [IU] via SUBCUTANEOUS
  Administered 2019-04-24: 5 [IU] via SUBCUTANEOUS
  Administered 2019-04-24: 2 [IU] via SUBCUTANEOUS
  Administered 2019-04-24: 3 [IU] via SUBCUTANEOUS

## 2019-04-23 MED ORDER — INSULIN GLARGINE 100 UNIT/ML ~~LOC~~ SOLN
8.0000 [IU] | Freq: Two times a day (BID) | SUBCUTANEOUS | Status: DC
  Administered 2019-04-23 – 2019-05-03 (×17): 8 [IU] via SUBCUTANEOUS
  Filled 2019-04-23 (×22): qty 0.08

## 2019-04-23 MED ORDER — SODIUM CHLORIDE 0.9 % IV SOLN
2.0000 g | Freq: Two times a day (BID) | INTRAVENOUS | Status: DC
  Administered 2019-04-23 – 2019-04-27 (×9): 2 g via INTRAVENOUS
  Filled 2019-04-23 (×11): qty 2

## 2019-04-23 MED ORDER — FUROSEMIDE 10 MG/ML IJ SOLN
20.0000 mg | Freq: Once | INTRAMUSCULAR | Status: AC
  Administered 2019-04-23: 20 mg via INTRAVENOUS
  Filled 2019-04-23: qty 2

## 2019-04-23 MED ORDER — ORAL CARE MOUTH RINSE
15.0000 mL | Freq: Four times a day (QID) | OROMUCOSAL | Status: DC
  Administered 2019-04-23: 15 mL via OROMUCOSAL

## 2019-04-23 MED ORDER — FUROSEMIDE 10 MG/ML IJ SOLN: 20.0000 mg | Freq: Once | INTRAMUSCULAR | Status: DC

## 2019-04-23 MED ORDER — ACETYLCYSTEINE 20 % IN SOLN
2.0000 mL | Freq: Three times a day (TID) | RESPIRATORY_TRACT | Status: DC
  Administered 2019-04-23 – 2019-04-25 (×5): 2 mL via RESPIRATORY_TRACT
  Administered 2019-04-25: 4 mL via RESPIRATORY_TRACT
  Administered 2019-04-26 (×2): 2 mL via RESPIRATORY_TRACT
  Administered 2019-04-27: 4 mL via RESPIRATORY_TRACT
  Administered 2019-04-27 – 2019-04-28 (×2): 2 mL via RESPIRATORY_TRACT
  Administered 2019-04-28 (×2): 4 mL via RESPIRATORY_TRACT
  Filled 2019-04-23 (×2): qty 4
  Filled 2019-04-23: qty 2
  Filled 2019-04-23 (×10): qty 4
  Filled 2019-04-23: qty 2
  Filled 2019-04-23 (×7): qty 4

## 2019-04-23 MED ORDER — LEVALBUTEROL HCL 0.63 MG/3ML IN NEBU
0.6300 mg | INHALATION_SOLUTION | Freq: Four times a day (QID) | RESPIRATORY_TRACT | Status: DC | PRN
  Administered 2019-04-23 – 2019-04-28 (×12): 0.63 mg via RESPIRATORY_TRACT
  Filled 2019-04-23 (×12): qty 3

## 2019-04-23 MED ORDER — NOREPINEPHRINE 16 MG/250ML-% IV SOLN
0.0000 ug/min | INTRAVENOUS | Status: DC
  Administered 2019-04-23 – 2019-04-25 (×3): 15 ug/min via INTRAVENOUS
  Administered 2019-04-25: 30 ug/min via INTRAVENOUS
  Administered 2019-04-26: 36 ug/min via INTRAVENOUS
  Administered 2019-04-26: 30 ug/min via INTRAVENOUS
  Administered 2019-04-27: 38 ug/min via INTRAVENOUS
  Administered 2019-04-27: 28 ug/min via INTRAVENOUS
  Administered 2019-04-27: 32 ug/min via INTRAVENOUS
  Administered 2019-04-28: 37 ug/min via INTRAVENOUS
  Administered 2019-04-28: 30 ug/min via INTRAVENOUS
  Administered 2019-04-28 – 2019-04-29 (×2): 26 ug/min via INTRAVENOUS
  Administered 2019-04-29: 22 ug/min via INTRAVENOUS
  Administered 2019-04-30: 20 ug/min via INTRAVENOUS
  Administered 2019-05-01: 40 ug/min via INTRAVENOUS
  Administered 2019-05-02: 24 ug/min via INTRAVENOUS
  Administered 2019-05-02: 41 ug/min via INTRAVENOUS
  Administered 2019-05-02: 38 ug/min via INTRAVENOUS
  Administered 2019-05-03: 25 ug/min via INTRAVENOUS
  Administered 2019-05-03: 38 ug/min via INTRAVENOUS
  Administered 2019-05-03: 30 ug/min via INTRAVENOUS
  Administered 2019-05-04: 12:00:00 16 ug/min via INTRAVENOUS
  Administered 2019-05-05: 07:00:00 17 ug/min via INTRAVENOUS
  Administered 2019-05-06: 04:00:00 12 ug/min via INTRAVENOUS
  Administered 2019-05-06 – 2019-05-10 (×5): 15 ug/min via INTRAVENOUS
  Administered 2019-05-11: 16 ug/min via INTRAVENOUS
  Administered 2019-05-12: 09:00:00 12 ug/min via INTRAVENOUS
  Administered 2019-05-13: 10 ug/min via INTRAVENOUS
  Administered 2019-05-16: 5 ug/min via INTRAVENOUS
  Administered 2019-05-19: 10 ug/min via INTRAVENOUS
  Filled 2019-04-23 (×38): qty 250

## 2019-04-23 NOTE — Anesthesia Postprocedure Evaluation (Signed)
Anesthesia Post Note  Patient: Brendan [name]  Procedure(s) Performed: CORONARY ARTERY BYPASS GRAFTING (CABG) x3, USING LEFT INTERNAL MAMMARY ARTERY AND LEFT LEG GREATER SAPHENOUS VEIN HARVESTED ENDOSCOPICALLY (N/A Chest) MITRAL VALVE (MV) REPAIR USING PHYSIO II RING SIZE 26MM (N/A Chest)     Patient location during evaluation: SICU Anesthesia Type: General Level of consciousness: sedated Pain management: pain level controlled Vital Signs Assessment: post-procedure vital signs reviewed and stable Respiratory status: patient remains intubated per anesthesia plan Cardiovascular status: stable Postop Assessment: no apparent nausea or vomiting Anesthetic complications: no    Last Vitals:  Vitals:   04/23/19 0845 04/23/19 0900  BP:  107/72  Pulse: 90 88  Resp: 10 13  Temp: 37.8 C 37.8 C  SpO2: 98% 98%    Last Pain:  Vitals:   04/23/19 0300  TempSrc:   PainSc: Asleep                 Brendan [name] DANIEL

## 2019-04-23 NOTE — Progress Notes (Signed)
ANTICOAGULATION CONSULT NOTE  Pharmacy Consult for Heparin Indication: impella 5.5  Heparin Dosing Weight: 68.2 kg  Labs: Recent Labs    04/21/19 0344 04/21/19 0500 04/21/19 0504 04/21/19 1330 05/05/2019 0402 05/03/2019 1408 05/04/2019 1642 04/20/2019 1656 05/05/2019 2230 04/26/2019 2238 04/23/19 0516 04/23/19 0516 04/23/19 0520 04/23/19 0907  HGB   < >  --    < >  --  7.4*   < > 8.5*   < > 9.6*   < > 8.8*   < > 9.3* 8.2*  HCT   < >  --    < >  --  22.3*   < > 24.9*   < > 27.9*   < > 26.0*  --  27.3* 24.0*  PLT   < >  --   --   --  114*   < > 66*  --  81*  --   --   --  93*  --   APTT  --   --   --   --   --   --  41*  --   --   --   --   --   --   --   LABPROT  --   --   --   --   --   --  18.8*  --   --   --   --   --   --   --   INR  --   --   --   --   --   --  1.6*  --   --   --   --   --   --   --   HEPARINUNFRC  --  <0.10*  --  0.13* <0.10*  --   --   --   --   --   --   --   --   --   CREATININE   < >  --   --   --  1.15  --   --   --  1.17  --   --   --  1.27*  --    < > = values in this interval not displayed.    Assessment: 64 yo male presenting to [facility] with malaise and SOB for 4 days found to have elevated troponin and new depressed EF. Transferred to [facility] for further evaluation. Patient was started on heparin IV on 3/26, no anticoagulation PTA.   Patient now s/p cath 3/30 showing severe 3-vessel obstructive CAD, high LV filling pressures, reduced cardiac output, and moderate pulmonary HTN.  Underwent CABG on 4/6 - impella 5.5 remains in place. Patient is receiving only standard heparin purge, running at 9.2 mL/hr (460 units/hr) - no plans for systemic heparin at this time per MD. Hgb at 9.3 today (received multiple blood products on 4/6), plt 93. No s/sx of bleeding.   Goal of Therapy:  Monitor platelets by anticoagulation protocol: Yes   Plan:  Continue heparin purge solution  No systemic heparin at this time Will follow along to see if plan for systemic  heparin in future  Antonietta Jewel, PharmD, [place] Pharmacist  Phone: [phone]  Please check AMION for all [facility] phone numbers After 10:00 PM, call [place] [phone] 04/23/2019 9:40 AM

## 2019-04-23 NOTE — Procedures (Signed)
Extubation Procedure Note  Patient Details:   Name: Brendan [name] DOB: 1955-01-21 MRN: [id number]   Airway Documentation:    Vent end date: 04/23/19 Vent end time: 0922   Evaluation  O2 sats: currently acceptable on a NRB.  Complications: Complications of desaturation after extubation to 80%. Placed on a 15L [place] & SAT's did not improve. Patient was then placed on a 55% ventil mask & SAT's improved to 85%. Patient was then placed on a NRB & SAT's imporved to 96%. Patient denied any shortness of breath & did not appear to be in any distress during the time he desaturated.  Patient did tolerate procedure well. Bilateral Breath Sounds: Rhonchi   Yes   NIF: -25, VC: 1.1L, positive cuff leak. Patient was instructed on IS x 5, highest goal reached was 332mL.   Annita Ratliff L 04/23/2019, 9:22 AM

## 2019-04-23 NOTE — Progress Notes (Addendum)
TCTS DAILY ICU PROGRESS NOTE                   [address]            [place] [address]          [phone]   1 Day Post-Op Procedure(s) (LRB): CORONARY ARTERY BYPASS GRAFTING (CABG) x3, USING LEFT INTERNAL MAMMARY ARTERY AND LEFT LEG GREATER SAPHENOUS VEIN HARVESTED ENDOSCOPICALLY (N/A) MITRAL VALVE (MV) REPAIR USING PHYSIO II RING SIZE 26MM (N/A)  Total Length of Stay:  LOS: 9 days   Subjective: Sedation continues to be lessened and weaning vent as able. Patient awake this am and fairly alert.  Objective: Vital signs in last 24 hours: Temp:  [97.7 F (36.5 C)-101.7 F (38.7 C)] 99.5 F (37.5 C) (04/07 0700) Pulse Rate:  [88-92] 89 (04/07 0700) Cardiac Rhythm: Atrial paced (04/07 0400) Resp:  [0-26] 13 (04/07 0700) BP: (87-99)/(50-68) 99/66 (04/07 0700) SpO2:  [99 %-100 %] 100 % (04/07 0700) Arterial Line BP: (92-124)/(48-58) 103/48 (04/07 0700) FiO2 (%):  [40 %-50 %] 50 % (04/07 0306) Weight:  [74.5 kg] 74.5 kg (04/07 0306)  Filed Weights   04/21/19 0600 04/23/2019 0443 04/23/19 0306  Weight: 67.7 kg 68.3 kg 74.5 kg    Weight change: 6.2 kg   Hemodynamic parameters for last 24 hours: PAP: (26-40)/(11-18) 29/12 CVP:  [8 mmHg-14 mmHg] 8 mmHg CO:  [5.7 L/min-7.1 L/min] 5.7 L/min CI:  [3.1 L/min/m2-3.8 L/min/m2] 3.1 L/min/m2  Intake/Output from previous day: 04/06 0701 - 04/07 0700 In: 8357.9 [I.V.:4214.2; Blood:2450; IV Piggyback:1569.3] Out: 3028 [Urine:1308; Blood:1000; Chest Tube:720]  Intake/Output this shift: No intake/output data recorded.  Current Meds: Scheduled Meds: . aspirin EC  325 mg Oral Daily   Or  . aspirin  324 mg Per Tube Daily  . bisacodyl  10 mg Oral Daily   Or  . bisacodyl  10 mg Rectal Daily  . chlorhexidine gluconate (MEDLINE KIT)  15 mL Mouth Rinse BID  . Chlorhexidine Gluconate Cloth  6 each Topical Daily  . docusate sodium  200 mg Oral Daily  . gabapentin  100 mg Oral TID  . mouth rinse  15 mL Mouth Rinse 10  times per day  . metoCLOPramide (REGLAN) injection  10 mg Intravenous Q6H  . metoprolol tartrate  12.5 mg Oral BID   Or  . metoprolol tartrate  12.5 mg Per Tube BID  . [START ON 04/24/2019] pantoprazole  40 mg Oral Daily  . pneumococcal 23 valent vaccine  0.5 mL Intramuscular Tomorrow-1000  . rosuvastatin  40 mg Oral q1800  . sodium chloride flush  3 mL Intravenous Q12H   Continuous Infusions: . sodium chloride Stopped (05/16/2019 1852)  . sodium chloride    . sodium chloride 20 mL/hr at 05/04/2019 1840  . albumin human 60 mL/hr at 05/11/2019 1900  . amiodarone 30 mg/hr (05/13/2019 2252)  . cefUROXime (ZINACEF)  IV 1.5 g (04/23/2019 2130)  . desmopressin (DDAVP) IV    . dexmedetomidine (PRECEDEX) IV infusion 0.4 mcg/kg/hr (04/23/19 5643)  . epinephrine 3 mcg/min (05/01/2019 1900)  . famotidine (PEPCID) IV 20 mg (05/10/2019 1600)  . insulin 1 Units/hr (04/23/19 3295)  . lactated ringers    . lactated ringers Stopped (04/21/2019 1640)  . lactated ringers 20 mL/hr at 05/05/2019 2004  . milrinone 0.25 mcg/kg/min (04/23/19 0205)  . norepinephrine (LEVOPHED) Adult infusion 10 mcg/min (04/23/19 0408)   PRN Meds:.sodium chloride, albumin human, dextrose, lactated ringers, metoprolol tartrate, midazolam, morphine  injection, ondansetron (ZOFRAN) IV, oxyCODONE, sodium chloride flush, traMADol  General appearance: alert and no distress Neurologic: intact Heart: Paced Lungs: Coarse upper airway sounds Abdomen: Soft, non tender, rare bowel sounsds this am Extremities: [employer], SCDs in place. Trace LE edema Wound: Aquacel intact. Bilateral LE wounds are clean and dry. There is a rash over anterior chest and abdoment, small area right arm (? from [place])  Lab Results: CBC: Recent Labs    05/09/2019 2230 05/14/2019 2238 04/23/19 0516 04/23/19 0520  WBC 12.4*  --   --  12.6*  HGB 9.6*   < > 8.8* 9.3*  HCT 27.9*   < > 26.0* 27.3*  PLT 81*  --   --  93*   < > = values in this interval not displayed.    BMET:  Recent Labs    05/14/2019 2230 05/15/2019 2238 04/23/19 0516 04/23/19 0520  NA 135   < > 136 135  K 4.5   < > 4.5 4.4  CL 102  --   --  104  CO2 23  --   --  22  GLUCOSE 105*  --   --  99  BUN 21  --   --  21  CREATININE 1.17  --   --  1.27*  CALCIUM 7.8*  --   --  7.8*   < > = values in this interval not displayed.    CMET: Lab Results  Component Value Date   WBC 12.6 (H) 04/23/2019   HGB 9.3 (L) 04/23/2019   HCT 27.3 (L) 04/23/2019   PLT 93 (L) 04/23/2019   GLUCOSE 99 04/23/2019   CHOL 174 08/21/2017   TRIG 143 08/21/2017   HDL 57 08/21/2017   LDLCALC 88 08/21/2017   ALT 38 04/21/2019   AST 44 (H) 04/21/2019   NA 135 04/23/2019   K 4.4 04/23/2019   CL 104 04/23/2019   CREATININE 1.27 (H) 04/23/2019   BUN 21 04/23/2019   CO2 22 04/23/2019   TSH 0.925 04/13/2019   INR 1.6 (H) 05/14/2019   HGBA1C 9.0 (H) 04/13/2019   MICROALBUR 30 08/21/2017    PT/INR:  Recent Labs    05/16/2019 1642  LABPROT 18.8*  INR 1.6*   Radiology: DG Chest Portable 1 View  Result Date: 05/09/2019 CLINICAL DATA:  64 year old male status post open heart surgery. Line placement. EXAM: PORTABLE CHEST 1 VIEW COMPARISON:  Earlier radiograph dated 04/26/2019. FINDINGS: There has been interval advancement of the Swan-Ganz catheter with tip projecting over the left ventricle likely in the left lower lobe pulmonary artery. Endotracheal tube remains above the carina. Bilateral chest tubes and mediastinal drain noted. Right subclavian Impella device as seen on the earlier radiograph. Right-sided PICC is also in similar position. There has been interval placement of an additional support apparatus with tip projecting over the mediastinum. No significant interval change in the appearance of the lungs or cardiomediastinal silhouette. Median sternotomy wires, CABG vascular clips, and cardiac valve repair. IMPRESSION: 1. Interval advancement of the Swan-Ganz catheter with tip projecting over the left  ventricle likely in the left lower lobe pulmonary artery. Clinical correlation is recommended. 2. Additional support apparatus as above. No other interval change in the appearance of the lungs or cardiomediastinal silhouette since the earlier radiograph. Electronically Signed   By: Anner Crete M.D.   On: 05/15/2019 16:57   DG Chest Portable 1 View  Result Date: 04/30/2019 CLINICAL DATA:  Swan-Ganz adjustment status post cardiac surgery. EXAM: PORTABLE CHEST  1 VIEW COMPARISON:  Radiograph of same day. FINDINGS: Stable cardiomediastinal silhouette. Endotracheal tube is in grossly good position. Stable position of Impella device. Bilateral chest tubes are noted without pneumothorax. Left lung is clear. Mild right basilar subsegmental atelectasis is noted. Right internal jugular Swan-Ganz catheter is noted with tip in expected position of proximal left pulmonary artery; it has been withdrawn partially since prior exam. Bony thorax is unremarkable. IMPRESSION: Stable position of Impella device. Bilateral chest tubes are noted without pneumothorax. Right internal jugular Swan-Ganz catheter has been withdrawn partially since prior exam with tip in expected position of proximal left pulmonary artery. Stable right basilar subsegmental atelectasis. Electronically Signed   By: Marijo Conception M.D.   On: 05/14/2019 16:49    Assessment/Plan: S/P Procedure(s) (LRB): CORONARY ARTERY BYPASS GRAFTING (CABG) x3, USING LEFT INTERNAL MAMMARY ARTERY AND LEFT LEG GREATER SAPHENOUS VEIN HARVESTED ENDOSCOPICALLY (N/A) MITRAL VALVE (MV) REPAIR USING PHYSIO II RING SIZE 26MM (N/A)  1. CV-CO/CI 5.7/3.1. Paced. Impella on P-5. Co ox this am 66.2. On Amiodarone, Epineprhine, Milrinone, and Nor epi drips. 2. Pulmonary-On vent and is weaning to extubate. Chest tubes with 720 cc of output last 24 hours. On Nitric oxide 3 ppm. CXR this am appears stable. 3. Expected post op blood loss anemia-H and H this am 9.3 and 27.3 4.  DM-CBGs 95/97/88. Pre op HGA1C 9. 5. Creatinine slightly increased to 1.27 6. Thrombocytopenia-platelets 93,000 7. Acute systolic heart failure-Diuresis per Dr. Haroldine Laws  8. Regarding rash-will discuss with Dr. Prescott Gum if should give a dose of steroid or just continue to monitor  [name] PA-C 04/23/2019 7:49 AM   Nitric oxide weaned off We will start Lasix drip for diuresis and patient weaned and extubated. Chest x-ray shows some atelectasis of the right lower lobe and BiPAP will be added to help with expansion  patient examined and medical record reviewed,agree with above note. [name] 04/23/2019

## 2019-04-23 NOTE — Op Note (Signed)
NAME: Brendan, [name] MEDICAL RECORD WY:[id number] ACCOUNT [id number] DATE OF BIRTH:03-04-1955 FACILITY: [facility] LOCATION: [facility] PHYSICIAN:Sarabelle Genson VAN TRIGT III, MD  OPERATIVE REPORT  DATE OF PROCEDURE:  04/28/2019  OPERATIONS: 1.  Coronary artery bypass grafting x3 (left internal mammary artery to left anterior descending, saphenous vein graft to posterior descending, saphenous vein graft to second diagonal). 2.  Mitral valve annuloplasty using a 26 mm Edwards Physio II annuloplasty ring, serial [id number]. 3.  Endoscopic harvest of left leg greater saphenous vein.  SURGEON:  Ivin Poot, MD  ASSISTANT:  Nicholes Rough, PA-C.  PREOPERATIVE DIAGNOSES:   1.  Severe coronary artery disease with recent acute myocardial infarction, ischemic cardiomyopathy, preoperative cardiogenic shock.  2.  Severe poorly controlled diabetes.  3.  Severe protein deficiency malnutrition.  4.  Moderate to severe ischemic mitral regurgitation.  POSTOPERATIVE DIAGNOSES: 1.  Severe coronary artery disease with recent acute myocardial infarction, ischemic cardiomyopathy, preoperative cardiogenic shock.  2.  Severe poorly controlled diabetes.  3.  Severe protein deficiency malnutrition.  4.  Moderate to severe ischemic mitral regurgitation.  ANESTHESIA:  General by Dr. Tedra Senegal.  CLINICAL NOTE:  The patient is a 64 year old male transferred from an outside hospital after an MI with heart failure, acute respiratory distress and for hemodynamics.  He was accepted by the cardiology service where echo and cardiac catheterization were  performed.  This demonstrated low cardiac output, severe LV dysfunction with EF of 20%, severe mitral regurgitation, and pulmonary edema.  He had severe 3-vessel coronary artery disease and elevated pulmonary pressures.  He was treated with inotropic  support and then a balloon pump, but showed further deterioration, requiring intubation.  He was then taken to the operating room  where an Impella 5.5 was placed via the right axillary artery for hemodynamic stabilization and prepare the patient for  definitive cardiac surgery, CABG plus mitral valve replacement.  After the patient was extubated and diuresed and hemodynamics were satisfactory, he was brought back to the operating room for CABG with mitral valve repair or replacement.  I discussed the  benefits and risks of the procedure in detail with the patient, as well as family.  They understood that he was at high risk because of his severe heart disease and general poor health, but that the operation was his best chance for long-term survival  and improved quality of life.  OPERATIVE FINDINGS: 1.  Adequate conduit. 2.  Poor targets, especially the LAD, which was small and diffusely diseased. 3.  Successful repair of the mitral valve with a ring annuloplasty and no residual mitral regurgitation  after separation from cardiopulmonary bypass. 4.  Postoperative coagulopathy requiring blood product administration.  DESCRIPTION OF PROCEDURE:  The patient was brought from preoperative holding in the ICU directly to the operating room.  Informed consent had been previously documented and final issues addressed with the patient.  He was placed supine on the operating  table.  The Impella rep was present for the procedure.  The Impella functioned properly throughout the procedure.  The patient was induced with general anesthesia and remained stable.  A new PA catheter and A-line were placed.  The patient was prepped and draped as a sterile field and a proper time-out was performed.  A sternal incision was made as the saphenous vein was harvested from the left leg.  The vein was exposed in the right leg, but not harvested.  During the chest wall incision, we encountered significant bleeding, which continued throughout the procedure.  The left internal mammary artery was harvested as a pedicle graft from its origin at the  subclavian vessels.  It was a 1.5 mm vessel with good flow.  The left greater saphenous vein was harvested, which was also a good conduit.  The sternal retractor was placed and the pericardium suspended.  Heparin was administered and pursestrings were placed in the ascending aorta and the base of the right atrial appendage.  When the ACT was documented as being therapeutic, the patient was  cannulated and placed on bypass.  A second venous cannula was placed low in the right atrium to directly cannulate the IVC.  Caval tapes were then encircled around the SVC and IVC.  The interatrial groove was dissected.  The coronaries were dissected for grafting and the posterior descending, LAD and diagonal were all found to be heavily diseased, somewhat small, especially the LAD and posterior descending, but adequate targets.  Cardioplegia cannulas were placed with  both antegrade and retrograde cold blood cardioplegia and the patient was cooled to 32 degrees.  The aortic crossclamp was applied and a liter of cold blood cardioplegia was delivered in split doses between the antegrade aortic and retrograde coronary  sinus catheters.  The distal coronary anastomoses were performed first.  The first distal anastomosis was the posterior descending, which was 1.4 mm vessel with proximal 90% stenosis.  Reverse saphenous vein was sewn end-to-side with running 7-0 Prolene with good flow  through the graft.  The second distal anastomosis was the diagonal branch of LAD.  This was heavily calcified and diseased and had a proximal ostial 80% stenosis.  Reverse saphenous vein was sewn end-to-side with running 7-0 Prolene with good flow through the graft.   Cardioplegia was redosed.  The third distal anastomosis was to the distal LAD, which was diffusely diseased, calcified and a suboptimal target.  The left IMA pedicle was brought through an opening in the left lateral pericardium and was brought down onto the LAD and sewn   end-to-side with running 8-0 Prolene.  There was good flow through the anastomosis after briefly releasing the pedicle bulldog on the mammary artery.  The bulldog was reapplied and the pedicle was secured to the epicardium with 6-0 Prolenes.   Cardioplegia was redosed.  Attention was then directed to the mitral valve.  The left atrium was opened in the interatrial groove and the retractor blades were adjusted.  There was adequate visualization.  A saline test showed central mitral regurgitation jet.  The anterior  leaflet was sized to a 26 mm Edwards Physio II ring.  2-0 Ethibond sutures were placed around the annulus numbering 14 total.  The sutures were then placed through the ring and the ring was lowered and sutures were tied.  The ring was tight to the  annulus and the saline test showed no residual leak after the ring annuloplasty.  The atrium was closed in 2 layers with a running 3-0 Prolene.  After another dose of cardioplegia, the proximal vein anastomoses were placed on the ascending aorta using a 4.5 mm punch and running 6-0 Prolene.  Air was then vented from the coronaries on the left side of the heart with the usual de-airing maneuvers  and a dose of retrograde warm blood cardioplegia.  The crossclamp was removed.  The heart resumed a spontaneous rhythm.  The cardioplegia cannulas were removed.  The lower caval cannulas were removed and the patient was maintained with adequate flow on bypass with the SVC cannula and the  aortic cannula.  Temporary pacing wires were  applied.  Both pleural spaces were drained of any pleural effusion.  The lungs were expanded.  The ventilator was resumed.  The Impella was then initiated slowly, starting from P2 to P5 as cardiopulmonary bypass was transitioned off.  The patient  remained hemodynamically stable on low-dose inotropes.  The patient had a significant coagulopathy.  Platelet count was low at 60,000.  The TEG analysis showed deficiency of clotting  factors as well, so the patient was ordered for FFP and platelets.  After these were administered, the coagulopathy improved.   Bilateral pleural tubes, as well as anterior mediastinal and posterior mediastinal chest tubes were placed.  The superior pericardial fat was closed over the aorta.  The sternum was then closed with a wire.  The pectoralis fascia was closed with a  running #1 Vicryl and the subcutaneous and skin layers were closed with a running Vicryl.  The leg incision was closed in a standard fashion.  After the chest dressings were placed, the Impella was revised in regard to its position in the LV using the TEE.  It was not removed from the ventricle and the insertion incision was not reopened to do this repositioning of the Impella to an optimal  position.  The patient was then transported back to the ICU in stable condition.  Total cardiopulmonary bypass time was 211 minutes.  VN/NUANCE  D:04/23/2019 T:04/23/2019 JOB:[id number]

## 2019-04-23 NOTE — Progress Notes (Signed)
Started Nitric wean by 5.  Patient now on 10 ppm.

## 2019-04-23 NOTE — Progress Notes (Signed)
CT surgery p.m. Rounds  Patient placed on BiPAP this afternoon for PO2 of 58 on high flow oxygen.  PO2 improved with BiPAP.  Receiving intense pulmonary toilet for atelectasis of right lower lobe  Hemodynamics stable on moderate dose of Levophed, Lasix drip resulted in 1.5 L of urine output today  P.m. labs satisfactory-potassium 4.4, hemoglobin 8.8

## 2019-04-23 NOTE — Progress Notes (Signed)
Advanced Heart Failure Rounding Note  PCP-Cardiologist: No primary care provider on file.   Subjective:    Events - Presented to [facility] with acute HF  EF 20-25% - Transferred to con - Cath 3/30 with severe 3 vessel disease. EF 25% - Developed PMVT on milrinone -> milrinone stopped -> developed worsening shock -> IABP placed on 4/1 - Clinical deterioration 4/1-> intubated - 4/2 underwent Impella 5.5 placement earlier and bilateral chest tubes with 3L out from each side.  - Extubated 4/4 - 04/27/2019 CABG and MVR  Underwent CABG and MVRepair 4/6. Remains intubated. Sedated   On NE 10, Epi 3 milrinone 0.25 and iNO.   Impella 5.5 on P-5 with good flow (3.5L) and waveforms  Hemodynamics RA 10 PA 28/12 Thermo 5.6/3.0    Objective:   Weight Range: 74.5 kg Body mass index is 22.91 kg/m.   Vital Signs:   Temp:  [97.7 F (36.5 C)-101.7 F (38.7 C)] 99.9 F (37.7 C) (04/07 0745) Pulse Rate:  [88-92] 90 (04/07 0817) Resp:  [0-26] 15 (04/07 0817) BP: (87-125)/(46-68) 125/47 (04/07 0817) SpO2:  [96 %-100 %] 98 % (04/07 0817) Arterial Line BP: (92-124)/(47-58) 101/47 (04/07 0745) FiO2 (%):  [40 %-50 %] 40 % (04/07 0817) Weight:  [74.5 kg] 74.5 kg (04/07 0306) Last BM Date: 04/20/19  Weight change: Filed Weights   04/21/19 0600 04/19/2019 0443 04/23/19 0306  Weight: 67.7 kg 68.3 kg 74.5 kg    Intake/Output:   Intake/Output Summary (Last 24 hours) at 04/23/2019 0850 Last data filed at 04/23/2019 0800 Gross per 24 hour  Intake 8123.51 ml  Output 3098 ml  Net 5025.51 ml      Physical Exam   General:  Intubated/ awake on vent  Following commands HEENT: normal Neck: supple. RIJ swan. Carotids 2+ bilat; no bruits. No lymphadenopathy or thryomegaly appreciated. Cor: Impella site ok  Sternal dressing ok PMI nondisplaced. Regular rate & rhythm. No rubs, gallops or murmurs. Rash on chest  +CTs Lungs: coarse Abdomen: soft, nontender, + distended. No hepatosplenomegaly. No  bruits or masses. Hypoactive bowel sounds. Extremities: no cyanosis, clubbing, rash, 1+edema Neuro: awake on vent follows commands     Telemetry   SR 90s Personally reviewed   Labs    CBC Recent Labs    04/21/19 0344 04/21/19 0504 05/11/2019 0402 05/15/2019 1408 04/27/2019 2230 04/21/2019 2238 04/23/19 0516 04/23/19 0520  WBC 18.4*   < > 11.1*   < > 12.4*  --   --  12.6*  NEUTROABS 15.7*  --  8.6*  --   --   --   --   --   HGB 9.1*   < > 7.4*   < > 9.6*   < > 8.8* 9.3*  HCT 26.9*   < > 22.3*   < > 27.9*   < > 26.0* 27.3*  MCV 85.1   < > 85.4   < > 85.3  --   --  84.5  PLT 144*   < > 114*   < > 81*  --   --  93*   < > = values in this interval not displayed.   Basic Metabolic Panel Recent Labs    04/20/2019 2230 04/21/2019 2238 04/23/19 0516 04/23/19 0520  NA 135   < > 136 135  K 4.5   < > 4.5 4.4  CL 102  --   --  104  CO2 23  --   --  22  GLUCOSE 105*  --   --  99  BUN 21  --   --  21  CREATININE 1.17  --   --  1.27*  CALCIUM 7.8*  --   --  7.8*  MG 3.4*  --   --  3.0*   < > = values in this interval not displayed.   Liver Function Tests Recent Labs    04/21/19 0925  AST 44*  ALT 38  ALKPHOS 88  BILITOT 0.6  PROT 4.9*  ALBUMIN 1.7*   No results for input(s): LIPASE, AMYLASE in the last 72 hours. Cardiac Enzymes No results for input(s): CKTOTAL, CKMB, CKMBINDEX, TROPONINI in the last 72 hours.  BNP: BNP (last 3 results) Recent Labs    04/09/2019 0113  BNP 655.7*    ProBNP (last 3 results) No results for input(s): PROBNP in the last 8760 hours.   D-Dimer No results for input(s): DDIMER in the last 72 hours. Hemoglobin A1C No results for input(s): HGBA1C in the last 72 hours. Fasting Lipid Panel No results for input(s): CHOL, HDL, LDLCALC, TRIG, CHOLHDL, LDLDIRECT in the last 72 hours. Thyroid Function Tests No results for input(s): TSH, T4TOTAL, T3FREE, THYROIDAB in the last 72 hours.  Invalid input(s): FREET3  Other results:   Imaging     DG Chest Portable 1 View  Result Date: 04/23/2019 CLINICAL DATA:  64 year old male status post open heart surgery. Line placement. EXAM: PORTABLE CHEST 1 VIEW COMPARISON:  Earlier radiograph dated 04/27/2019. FINDINGS: There has been interval advancement of the Swan-Ganz catheter with tip projecting over the left ventricle likely in the left lower lobe pulmonary artery. Endotracheal tube remains above the carina. Bilateral chest tubes and mediastinal drain noted. Right subclavian Impella device as seen on the earlier radiograph. Right-sided PICC is also in similar position. There has been interval placement of an additional support apparatus with tip projecting over the mediastinum. No significant interval change in the appearance of the lungs or cardiomediastinal silhouette. Median sternotomy wires, CABG vascular clips, and cardiac valve repair. IMPRESSION: 1. Interval advancement of the Swan-Ganz catheter with tip projecting over the left ventricle likely in the left lower lobe pulmonary artery. Clinical correlation is recommended. 2. Additional support apparatus as above. No other interval change in the appearance of the lungs or cardiomediastinal silhouette since the earlier radiograph. Electronically Signed   By: Anner Crete M.D.   On: 05/14/2019 16:57   DG Chest Portable 1 View  Result Date: 04/19/2019 CLINICAL DATA:  Swan-Ganz adjustment status post cardiac surgery. EXAM: PORTABLE CHEST 1 VIEW COMPARISON:  Radiograph of same day. FINDINGS: Stable cardiomediastinal silhouette. Endotracheal tube is in grossly good position. Stable position of Impella device. Bilateral chest tubes are noted without pneumothorax. Left lung is clear. Mild right basilar subsegmental atelectasis is noted. Right internal jugular Swan-Ganz catheter is noted with tip in expected position of proximal left pulmonary artery; it has been withdrawn partially since prior exam. Bony thorax is unremarkable. IMPRESSION: Stable  position of Impella device. Bilateral chest tubes are noted without pneumothorax. Right internal jugular Swan-Ganz catheter has been withdrawn partially since prior exam with tip in expected position of proximal left pulmonary artery. Stable right basilar subsegmental atelectasis. Electronically Signed   By: Marijo Conception M.D.   On: 04/30/2019 16:49     Medications:     Scheduled Medications: . aspirin EC  325 mg Oral Daily   Or  . aspirin  324 mg Per Tube Daily  . bisacodyl  10 mg Oral Daily   Or  .  bisacodyl  10 mg Rectal Daily  . chlorhexidine gluconate (MEDLINE KIT)  15 mL Mouth Rinse BID  . Chlorhexidine Gluconate Cloth  6 each Topical Daily  . docusate sodium  200 mg Oral Daily  . gabapentin  100 mg Oral TID  . mouth rinse  15 mL Mouth Rinse 10 times per day  . metoCLOPramide (REGLAN) injection  10 mg Intravenous Q6H  . [START ON 04/24/2019] pantoprazole  40 mg Oral Daily  . pneumococcal 23 valent vaccine  0.5 mL Intramuscular Tomorrow-1000  . rosuvastatin  40 mg Oral q1800  . sodium chloride flush  3 mL Intravenous Q12H    Infusions: . sodium chloride 20 mL/hr at 04/23/19 0800  . sodium chloride    . sodium chloride 20 mL/hr at 05/09/2019 1840  . albumin human 60 mL/hr at 04/21/2019 1900  . amiodarone 30 mg/hr (04/23/19 0800)  . cefUROXime (ZINACEF)  IV 1.5 g (04/23/19 0840)  . desmopressin (DDAVP) IV    . dexmedetomidine (PRECEDEX) IV infusion 0.3 mcg/kg/hr (04/23/19 0800)  . epinephrine 3 mcg/min (04/23/19 0800)  . famotidine (PEPCID) IV 20 mg (04/27/2019 1600)  . insulin 0.9 mL/hr at 04/23/19 0800  . lactated ringers    . lactated ringers Stopped (05/06/2019 1640)  . lactated ringers 20 mL/hr at 04/23/19 0800  . milrinone 0.25 mcg/kg/min (04/23/19 0800)  . norepinephrine (LEVOPHED) Adult infusion 10 mcg/min (04/23/19 0800)    PRN Medications: sodium chloride, albumin human, dextrose, lactated ringers, metoprolol tartrate, midazolam, morphine injection, ondansetron  (ZOFRAN) IV, oxyCODONE, sodium chloride flush, traMADol     Assessment/Plan    1.Acute systolic HF ->  Cardiogenic Shock  - Due to iCM. EF 20-25% - Impella 5.5 placed on 4/2.   - s/p CABG/MVRepair on 4/6  Now POD #1 - doing well with Impella support and low-dose NE, epi and milrinone - Swan numbers look good - Continue to wean drips (NE first then epi) - Likely can extubate this am once iNO is off - Continue impella support  - start lasix gtt - D/w Dr. Prescott Gum at bedside  2. CAD - LHC with severe 3 vessel CAD  - s/p CABG x 3 MVRepair 4/6  - stable  3. Acute hypoxic respiratory failure - intubated 4/1. Extubated 4/4 - Remains intubated post CABG MVR earlier today - likely extubate this am after iNO off  4. Mitral Regurgitation - Mod-severe on ECHO - s/p MVrepair   5. Uncontrolled DM -Hgb A1C 9.  - On insulin and sliding scale.   6. AKI - due to shock - resolved with hemodynamic supprot   7. Acute blood loss Anemia  -transfuse as need to keep Hgb > 7.5  8. 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   -Keep  Mag >2   9. Neuropathy -Very limited with severe neuropathy. No sensation R and L foot and occasionaly in his hands. Requires assistance with ADLs.   10. Severe Malnutrition -Prealbumin 8.9  -Nutrition on board - Continue TFs   CRITICAL CARE Performed by: Glori Bickers  Total critical care time: 35 minutes  Critical care time was exclusive of separately billable procedures and treating other patients.  Critical care was necessary to treat or prevent imminent or life-threatening deterioration.  Critical care was time spent personally by me (independent of midlevel providers or residents) on the following activities: development of treatment plan with patient and/or surrogate as well as nursing, discussions with consultants, evaluation of patient's response to treatment, examination of  patient, obtaining history from patient or surrogate,  ordering and performing treatments and interventions, ordering and review of laboratory studies, ordering and review of radiographic studies, pulse oximetry and re-evaluation of patient's condition.    Length of Stay: 9  Glori Bickers, MD  04/23/2019, 8:50 AM  Advanced Heart Failure Team Pager [phone] (M-F; 7a - 4p)  Please contact [facility] for night-coverage after hours (4p -7a ) and weekends on [email or web address]

## 2019-04-23 NOTE — Progress Notes (Signed)
Nitric weaned down to 4 ppm.

## 2019-04-23 NOTE — Plan of Care (Signed)

## 2019-04-23 NOTE — Anesthesia Procedure Notes (Signed)
Central Venous Catheter Insertion Performed by: Duane Boston, MD, anesthesiologist Start/End04/29/2021 8:07 AM, 05/12/2019 8:17 AM Patient location: Pre-op. Preanesthetic checklist: patient identified, IV checked, site marked, risks and benefits discussed, surgical consent, monitors and equipment checked, pre-op evaluation, timeout performed and anesthesia consent Position: supine Patient sedated Hand hygiene performed  and maximum sterile barriers used  Catheter size: 9 Fr Total catheter length 8. PA cath was placed.Sheath introducer Swan type:thermodilution PA Cath depth:50 Procedure performed without using ultrasound guided technique. Attempts: 1 Following insertion, line sutured and dressing applied. Post procedure assessment: free fluid flow, blood return through all ports and no air  Patient tolerated the procedure well with no immediate complications. Additional procedure comments: Catheter exchanged over wire: wire inserted through introducer that was present when patient arrived.Marland Kitchen

## 2019-04-23 NOTE — Progress Notes (Signed)
Nitric weaned down to 5 ppm.

## 2019-04-23 NOTE — Progress Notes (Signed)
Nutrition Follow-up  DOCUMENTATION CODES:   Severe malnutrition in context of chronic illness  INTERVENTION:   If unable to advance diet within 24-48, recommend insertion of Cortrak tube with initiation of TF. Cortrak service available again on 4/09  Add Ensure Enlive po TID, each supplement provides 350 kcal and 20 grams of protein, once diet advanced   NUTRITION DIAGNOSIS:   Severe Malnutrition related to chronic illness as evidenced by severe muscle depletion, severe fat depletion.  Continues  GOAL:   Patient will meet greater than or equal to 90% of their needs  Not Met  MONITOR:   Vent status, Diet advancement, Labs, Weight trends, TF tolerance  REASON FOR ASSESSMENT:   Consult Poor PO, Assessment of nutrition requirement/status  ASSESSMENT:   64 yo male admitted with acute systeolic CHF, CAD with 3 vessel disease with plan for CABG. PMH includes DM with HgbA1c 9 with neuropathy, HTN  3/30 Cardiac Cath with severe 3-vessel CAD 3/31 ECHO: EF 30-35% 4/01 IABP placed, Intubated 4/02 Impella placed, bilateral CT placed for pleural effusions 4/04 Extubated 4/06 CABG x 3, MV repair, Intubated  4/07 Extubated  Pt on HFNC currently, remains NPO  Pt with no recorded po intake since admission; pt on po diet from 3/30 in the evening until 4/01 when intubated. TF started on 4/02 but discontinued with extubation on 4/04. Pt ordered po diet on AM on 4/05 but then NPO again for surgery on 4/06 and has not had nutrition since.   Pt with inadequate nutrition since admission and given malnutrition at baseline, if unable to advance diet within 24-48 hours recommend insertion of Cortrak with initiation of TF  Current weight 74.5 kg , weight up post-op. Admission weight 68.2 kg. Net positive since admission  Labs: CBGs 99-127 Meds: insulin drip, lasix drip    Diet Order:   Diet Order            Diet NPO time specified  Diet effective now              EDUCATION  NEEDS:   Education needs have been addressed  Skin:  Skin Assessment: Reviewed RN Assessment  Last BM:  4/7  Height:   Ht Readings from Last 1 Encounters:  04/23/2019 5' 11" (1.803 m)    Weight:   Wt Readings from Last 1 Encounters:  04/23/19 74.5 kg    BMI:  Body mass index is 22.91 kg/m.  Estimated Nutritional Needs:   Kcal:  2000-2200 kcals  Protein:  100-115 g  Fluid:  >/= 1.5 L   Cate  MS, RDN, LDN, CNSC RD Pager Number and Weekend/On-Call After Hours Pager Located in Amion    

## 2019-04-24 ENCOUNTER — Inpatient Hospital Stay (HOSPITAL_COMMUNITY): Payer: Medicaid Other

## 2019-04-24 LAB — CBC
HCT: 25.7 % — ABNORMAL LOW (ref 39.0–52.0)
HCT: 24.6 % — ABNORMAL LOW (ref 39.0–52.0)
Hemoglobin: 8.7 g/dL — ABNORMAL LOW (ref 13.0–17.0)
Hemoglobin: 8.2 g/dL — ABNORMAL LOW (ref 13.0–17.0)
MCH: 29.2 pg (ref 26.0–34.0)
MCH: 29 pg (ref 26.0–34.0)
MCHC: 33.9 g/dL (ref 30.0–36.0)
MCHC: 33.3 g/dL (ref 30.0–36.0)
MCV: 86.2 fL (ref 80.0–100.0)
MCV: 86.9 fL (ref 80.0–100.0)
Platelets: 115 10*3/uL — ABNORMAL LOW (ref 150–400)
Platelets: 124 10*3/uL — ABNORMAL LOW (ref 150–400)
RBC: 2.98 MIL/uL — ABNORMAL LOW (ref 4.22–5.81)
RBC: 2.83 MIL/uL — ABNORMAL LOW (ref 4.22–5.81)
RDW: 15 % (ref 11.5–15.5)
RDW: 15.1 % (ref 11.5–15.5)
WBC: 17.9 10*3/uL — ABNORMAL HIGH (ref 4.0–10.5)
WBC: 17.2 10*3/uL — ABNORMAL HIGH (ref 4.0–10.5)
nRBC: 0 % (ref 0.0–0.2)
nRBC: 0 % (ref 0.0–0.2)

## 2019-04-24 LAB — BASIC METABOLIC PANEL
Anion gap: 14 (ref 5–15)
BUN: 34 mg/dL — ABNORMAL HIGH (ref 8–23)
CO2: 21 mmol/L — ABNORMAL LOW (ref 22–32)
Calcium: 7.6 mg/dL — ABNORMAL LOW (ref 8.9–10.3)
Chloride: 98 mmol/L (ref 98–111)
Creatinine, Ser: 1.88 mg/dL — ABNORMAL HIGH (ref 0.61–1.24)
GFR calc Af Amer: 44 mL/min — ABNORMAL LOW (ref >60)
GFR calc non Af Amer: 38 mL/min — ABNORMAL LOW (ref >60)
Glucose, Bld: 266 mg/dL — ABNORMAL HIGH (ref 70–99)
Potassium: 4.1 mmol/L (ref 3.5–5.1)
Sodium: 133 mmol/L — ABNORMAL LOW (ref 135–145)

## 2019-04-24 LAB — COMPREHENSIVE METABOLIC PANEL
ALT: 34 U/L (ref 0–44)
AST: 48 U/L — ABNORMAL HIGH (ref 15–41)
Albumin: 2.2 g/dL — ABNORMAL LOW (ref 3.5–5.0)
Alkaline Phosphatase: 99 U/L (ref 38–126)
Anion gap: 12 (ref 5–15)
BUN: 26 mg/dL — ABNORMAL HIGH (ref 8–23)
CO2: 21 mmol/L — ABNORMAL LOW (ref 22–32)
Calcium: 7.7 mg/dL — ABNORMAL LOW (ref 8.9–10.3)
Chloride: 102 mmol/L (ref 98–111)
Creatinine, Ser: 1.76 mg/dL — ABNORMAL HIGH (ref 0.61–1.24)
GFR calc Af Amer: 47 mL/min — ABNORMAL LOW (ref >60)
GFR calc non Af Amer: 41 mL/min — ABNORMAL LOW (ref >60)
Glucose, Bld: 182 mg/dL — ABNORMAL HIGH (ref 70–99)
Potassium: 4.2 mmol/L (ref 3.5–5.1)
Sodium: 135 mmol/L (ref 135–145)
Total Bilirubin: 1.9 mg/dL — ABNORMAL HIGH (ref 0.3–1.2)
Total Protein: 4.6 g/dL — ABNORMAL LOW (ref 6.5–8.1)

## 2019-04-24 LAB — POCT I-STAT 7, (LYTES, BLD GAS, ICA,H+H)
Acid-base deficit: 2 mmol/L (ref 0.0–2.0)
Acid-base deficit: 3 mmol/L — ABNORMAL HIGH (ref 0.0–2.0)
Bicarbonate: 22.8 mmol/L (ref 20.0–28.0)
Bicarbonate: 20.9 mmol/L (ref 20.0–28.0)
Calcium, Ion: 1.17 mmol/L (ref 1.15–1.40)
Calcium, Ion: 1.13 mmol/L — ABNORMAL LOW (ref 1.15–1.40)
HCT: 27 % — ABNORMAL LOW (ref 39.0–52.0)
HCT: 23 % — ABNORMAL LOW (ref 39.0–52.0)
Hemoglobin: 9.2 g/dL — ABNORMAL LOW (ref 13.0–17.0)
Hemoglobin: 7.8 g/dL — ABNORMAL LOW (ref 13.0–17.0)
O2 Saturation: 98 %
O2 Saturation: 92 %
Patient temperature: 37.2
Patient temperature: 37.7
Potassium: 4.2 mmol/L (ref 3.5–5.1)
Potassium: 4 mmol/L (ref 3.5–5.1)
Sodium: 134 mmol/L — ABNORMAL LOW (ref 135–145)
Sodium: 133 mmol/L — ABNORMAL LOW (ref 135–145)
TCO2: 24 mmol/L (ref 22–32)
TCO2: 22 mmol/L (ref 22–32)
pCO2 arterial: 39.9 mmHg (ref 32.0–48.0)
pCO2 arterial: 31.6 mmHg — ABNORMAL LOW (ref 32.0–48.0)
pH, Arterial: 7.366 (ref 7.350–7.450)
pH, Arterial: 7.433 (ref 7.350–7.450)
pO2, Arterial: 119 mmHg — ABNORMAL HIGH (ref 83.0–108.0)
pO2, Arterial: 63 mmHg — ABNORMAL LOW (ref 83.0–108.0)

## 2019-04-24 LAB — APTT: aPTT: 50 s — ABNORMAL HIGH (ref 24–36)

## 2019-04-24 LAB — GLUCOSE, CAPILLARY
Glucose-Capillary: 147 mg/dL — ABNORMAL HIGH (ref 70–99)
Glucose-Capillary: 160 mg/dL — ABNORMAL HIGH (ref 70–99)
Glucose-Capillary: 179 mg/dL — ABNORMAL HIGH (ref 70–99)
Glucose-Capillary: 203 mg/dL — ABNORMAL HIGH (ref 70–99)
Glucose-Capillary: 215 mg/dL — ABNORMAL HIGH (ref 70–99)
Glucose-Capillary: 262 mg/dL — ABNORMAL HIGH (ref 70–99)
Glucose-Capillary: 75 mg/dL (ref 70–99)
Glucose-Capillary: 88 mg/dL (ref 70–99)

## 2019-04-24 LAB — PREPARE RBC (CROSSMATCH)

## 2019-04-24 LAB — POTASSIUM: Potassium: 4.1 mmol/L (ref 3.5–5.1)

## 2019-04-24 LAB — COOXEMETRY PANEL
Carboxyhemoglobin: 1.4 % (ref 0.5–1.5)
Methemoglobin: 1.5 % (ref 0.0–1.5)
O2 Saturation: 91 %
Total hemoglobin: 8.8 g/dL — ABNORMAL LOW (ref 12.0–16.0)

## 2019-04-24 LAB — HEPARIN LEVEL (UNFRACTIONATED): Heparin Unfractionated: <0.1 [IU]/mL — ABNORMAL LOW (ref 0.30–0.70)

## 2019-04-24 MED ORDER — METOLAZONE 5 MG PO TABS
10.0000 mg | ORAL_TABLET | Freq: Once | ORAL | Status: AC
  Administered 2019-04-24: 10 mg via ORAL
  Filled 2019-04-24: qty 2

## 2019-04-24 MED ORDER — AMIODARONE LOAD VIA INFUSION
150.0000 mg | Freq: Once | INTRAVENOUS | Status: AC
  Administered 2019-04-24: 150 mg via INTRAVENOUS

## 2019-04-24 MED ORDER — HEPARIN SODIUM (PORCINE) 5000 UNIT/ML IJ SOLN
50000.0000 [IU] | INTRAVENOUS | Status: DC
  Administered 2019-04-24: 50000 [IU]
  Filled 2019-04-24: qty 10

## 2019-04-24 MED ORDER — ORAL CARE MOUTH RINSE
15.0000 mL | Freq: Two times a day (BID) | OROMUCOSAL | Status: DC
  Administered 2019-04-24 – 2019-04-27 (×7): 15 mL via OROMUCOSAL

## 2019-04-24 MED ORDER — SODIUM CHLORIDE 0.9% FLUSH
10.0000 mL | Freq: Two times a day (BID) | INTRAVENOUS | Status: DC
  Administered 2019-04-24 – 2019-05-09 (×28): 10 mL
  Administered 2019-05-09: 10:00:00 20 mL
  Administered 2019-05-10: 10 mL
  Administered 2019-05-10: 20 mL
  Administered 2019-05-11 – 2019-05-15 (×8): 10 mL
  Administered 2019-05-15: 30 mL
  Administered 2019-05-16 – 2019-05-23 (×16): 10 mL
  Administered 2019-05-24: 20 mL
  Administered 2019-05-24 – 2019-05-25 (×2): 10 mL
  Administered 2019-05-25 – 2019-05-26 (×2): 20 mL
  Administered 2019-05-26 – 2019-05-31 (×10): 10 mL
  Administered 2019-06-01: 20 mL
  Administered 2019-06-01 – 2019-06-15 (×12): 10 mL
  Administered 2019-06-15: 40 mL
  Administered 2019-06-16 – 2019-06-21 (×9): 10 mL

## 2019-04-24 MED ORDER — SODIUM CHLORIDE 0.9% FLUSH: 10.0000 mL | INTRAVENOUS | Status: DC | PRN

## 2019-04-24 MED ORDER — METOLAZONE 2.5 MG PO TABS
2.5000 mg | ORAL_TABLET | Freq: Once | ORAL | Status: AC
  Administered 2019-04-24: 2.5 mg via ORAL
  Filled 2019-04-24: qty 1

## 2019-04-24 MED ORDER — ENSURE ENLIVE PO LIQD
237.0000 mL | Freq: Three times a day (TID) | ORAL | Status: DC
  Administered 2019-04-24 (×3): 237 mL via ORAL

## 2019-04-24 MED ORDER — SODIUM CHLORIDE 0.9% IV SOLUTION: Freq: Once | INTRAVENOUS | Status: AC

## 2019-04-24 MED ORDER — INSULIN ASPART 100 UNIT/ML ~~LOC~~ SOLN
0.0000 [IU] | SUBCUTANEOUS | Status: DC
  Administered 2019-04-24: 5 [IU] via SUBCUTANEOUS

## 2019-04-24 MED ORDER — ALBUMIN HUMAN 25 % IV SOLN
12.5000 g | Freq: Four times a day (QID) | INTRAVENOUS | Status: AC
  Administered 2019-04-24 (×3): 12.5 g via INTRAVENOUS
  Filled 2019-04-24 (×3): qty 50

## 2019-04-24 MED FILL — Heparin Sodium (Porcine) Inj 1000 Unit/ML: INTRAMUSCULAR | Qty: 10 | Status: AC

## 2019-04-24 MED FILL — Sodium Bicarbonate IV Soln 8.4%: INTRAVENOUS | Qty: 50 | Status: AC

## 2019-04-24 MED FILL — Heparin Sodium (Porcine) Inj 1000 Unit/ML: INTRAMUSCULAR | Qty: 30 | Status: AC

## 2019-04-24 MED FILL — Sodium Chloride IV Soln 0.9%: INTRAVENOUS | Qty: 2000 | Status: AC

## 2019-04-24 MED FILL — Calcium Chloride Inj 10%: INTRAVENOUS | Qty: 10 | Status: AC

## 2019-04-24 MED FILL — Electrolyte-R (PH 7.4) Solution: INTRAVENOUS | Qty: 4000 | Status: AC

## 2019-04-24 MED FILL — Potassium Chloride Inj 2 mEq/ML: INTRAVENOUS | Qty: 40 | Status: AC

## 2019-04-24 MED FILL — Lidocaine HCl Local Soln Prefilled Syringe 100 MG/5ML (2%): INTRAMUSCULAR | Qty: 5 | Status: AC

## 2019-04-24 MED FILL — Mannitol IV Soln 20%: INTRAVENOUS | Qty: 500 | Status: AC

## 2019-04-24 NOTE — Progress Notes (Signed)
Patient refuse NIV for the night. Patient states wants to rest.

## 2019-04-24 NOTE — Progress Notes (Addendum)
  Advanced Heart Failure Rounding Note  PCP-Cardiologist: No primary care provider on file.   Subjective:    Events - Presented to [facility] with acute HF  EF 20-25% - Transferred to con - Cath 3/30 with severe 3 vessel disease. EF 25% - Developed PMVT on milrinone -> milrinone stopped -> developed worsening shock -> IABP placed on 4/1 - Clinical deterioration 4/1-> intubated - 4/2 underwent Impella 5.5 placement earlier and bilateral chest tubes with 3L out from each side.  - Extubated 4/4 - 04/23/2019 CABG and MVR  Underwent CABG and MVRepair 4/6. Extubated 4/7  On NE 15, Epi 3 milrinone 0.25. Co-ox 91.   Continue w/ Impella 5.5 on P-5 with good flow (2.7L) and waveforms  Hemodynamics CVP 9 PA 29/13 Thermo 6.7/3.6  No complaints today.   Objective:   Weight Range: 73.9 kg Body mass index is 22.72 kg/m.   Vital Signs:   Temp:  [98.4 F (36.9 C)-100 F (37.8 C)] 99.1 F (37.3 C) (04/08 1015) Pulse Rate:  [81-101] 90 (04/08 1015) Resp:  [0-27] 17 (04/08 1015) BP: (116-128)/(50-75) 122/51 (04/08 0810) SpO2:  [76 %-100 %] 94 % (04/08 1015) Arterial Line BP: (89-148)/(42-60) 134/48 (04/08 1015) FiO2 (%):  [60 %-70 %] 60 % (04/08 0800) Weight:  [73.9 kg] 73.9 kg (04/08 0500) Last BM Date: 04/20/19  Weight change: Filed Weights   04/23/2019 0443 04/23/19 0306 04/24/19 0500  Weight: 68.3 kg 74.5 kg 73.9 kg    Intake/Output:   Intake/Output Summary (Last 24 hours) at 04/24/2019 1110 Last data filed at 04/24/2019 1000 Gross per 24 hour  Intake 2775.45 ml  Output 4185 ml  Net -1409.55 ml      Physical Exam   General:  Sitting up in bed. Alert and oriented. No respiratory distress.  HEENT: normal Neck: supple. +RIJ swan. Carotids 2+ bilat; no bruits. No lymphadenopathy or thryomegaly appreciated. Cor: Impella site ok  Sternal dressing ok PMI nondisplaced. Regular rate & rhythm. No rubs, gallops or murmurs.  +CTs Lungs: coarse BS bilaterally  Abdomen: soft,  nontender, nondistended. No hepatosplenomegaly. No bruits or masses. Hypoactive bowel sounds. Extremities: no cyanosis, clubbing, rash, 1+ bilateral LE edema Neuro: A&O, moving all extremities.    Telemetry   Apaced 90s Personally reviewed   Labs    CBC Recent Labs    04/24/2019 0402 05/13/2019 0811 04/23/19 1643 04/23/19 1903 04/24/19 0344 04/24/19 0356  WBC 11.1*   < > 18.9*  --  17.9*  --   NEUTROABS 8.6*  --   --   --   --   --   HGB 7.4*   < > 9.2*  8.8*   < > 8.7* 9.2*  HCT 22.3*   < > 27.4*  26.0*   < > 25.7* 27.0*  MCV 85.4   < > 85.6  --  86.2  --   PLT 114*   < > 106*  --  115*  --    < > = values in this interval not displayed.   Basic Metabolic Panel Recent Labs    04/23/19 0520 04/23/19 0907 04/23/19 1643 04/23/19 1903 04/24/19 0344 04/24/19 0356  NA 135   < > 135  135   < > 135 134*  K 4.4   < > 4.4  4.3   < > 4.2 4.2  CL 104   < > 104  --  102  --   CO2 22   < > 22  --  21*  --     GLUCOSE 99   < > 119*  --  182*  --   BUN 21   < > 22  --  26*  --   CREATININE 1.27*   < > 1.46*  --  1.76*  --   CALCIUM 7.8*   < > 7.7*  --  7.7*  --   MG 3.0*  --  2.8*  --   --   --    < > = values in this interval not displayed.   Liver Function Tests Recent Labs    04/24/19 0344  AST 48*  ALT 34  ALKPHOS 99  BILITOT 1.9*  PROT 4.6*  ALBUMIN 2.2*   No results for input(s): LIPASE, AMYLASE in the last 72 hours. Cardiac Enzymes No results for input(s): CKTOTAL, CKMB, CKMBINDEX, TROPONINI in the last 72 hours.  BNP: BNP (last 3 results) Recent Labs    03/31/2019 0113  BNP 655.7*    ProBNP (last 3 results) No results for input(s): PROBNP in the last 8760 hours.   D-Dimer No results for input(s): DDIMER in the last 72 hours. Hemoglobin A1C No results for input(s): HGBA1C in the last 72 hours. Fasting Lipid Panel No results for input(s): CHOL, HDL, LDLCALC, TRIG, CHOLHDL, LDLDIRECT in the last 72 hours. Thyroid Function Tests No results for  input(s): TSH, T4TOTAL, T3FREE, THYROIDAB in the last 72 hours.  Invalid input(s): FREET3  Other results:   Imaging    DG CHEST PORT 1 VIEW  Result Date: 04/24/2019 CLINICAL DATA:  Coronary artery bypass surgery and mitral valve replacement surgery. EXAM: PORTABLE CHEST 1 VIEW COMPARISON:  04/23/2019 FINDINGS: Stable surgical changes. The endotracheal tube and NG tubes have been removed. Bilateral chest tubes are stable. No pneumothorax. Impella device remains in good position without complicating features. Right IJ Swan-Ganz catheter in good position with its tip in the main pulmonary outflow tract. Right PICC line is stable. Mediastinal drain tubes are stable. Slight worsening lung aeration post extubation with slight increase in pulmonary edema and atelectasis. Persistent small right effusion. IMPRESSION: 1. Removal of endotracheal tube and NG tube. Other support apparatus is stable as detailed above. 2. Slight worsening lung aeration post extubation with slight increase in pulmonary edema and atelectasis. Electronically Signed   By: P.  Gallerani M.D.   On: 04/24/2019 08:57     Medications:     Scheduled Medications: . acetylcysteine  2 mL Nebulization TID  . aspirin EC  325 mg Oral Daily   Or  . aspirin  324 mg Per Tube Daily  . bisacodyl  10 mg Oral Daily   Or  . bisacodyl  10 mg Rectal Daily  . chlorhexidine gluconate (MEDLINE KIT)  15 mL Mouth Rinse BID  . Chlorhexidine Gluconate Cloth  6 each Topical Daily  . docusate sodium  200 mg Oral Daily  . feeding supplement (ENSURE ENLIVE)  237 mL Oral TID BM  . gabapentin  100 mg Oral TID  . insulin aspart  0-9 Units Subcutaneous Q4H  . insulin glargine  8 Units Subcutaneous BID  . mouth rinse  15 mL Mouth Rinse BID  . metoCLOPramide (REGLAN) injection  10 mg Intravenous Q6H  . pantoprazole  40 mg Oral Daily  . pneumococcal 23 valent vaccine  0.5 mL Intramuscular Tomorrow-1000  . rosuvastatin  40 mg Oral q1800  . sodium  chloride flush  10-40 mL Intracatheter Q12H  . sodium chloride flush  3 mL Intravenous Q12H    Infusions: . sodium chloride   Stopped (04/24/19 0551)  . sodium chloride    . sodium chloride 20 mL/hr at 05/16/2019 1840  . albumin human 12.5 g (04/24/19 1001)  . amiodarone 30 mg/hr (04/24/19 1000)  . ceFEPime (MAXIPIME) IV Stopped (04/24/19 3748)  . epinephrine 3 mcg/min (04/24/19 1000)  . furosemide (LASIX) infusion 8 mg/hr (04/24/19 1000)  . impella catheter heparin 50 unit/mL in dextrose 5%    . lactated ringers    . lactated ringers Stopped (04/19/2019 1640)  . lactated ringers 20 mL/hr at 04/24/19 1000  . milrinone 0.25 mcg/kg/min (04/24/19 1000)  . norepinephrine (LEVOPHED) Adult infusion 15 mcg/min (04/24/19 1000)    PRN Medications: sodium chloride, lactated ringers, levalbuterol, metoprolol tartrate, midazolam, morphine injection, ondansetron (ZOFRAN) IV, oxyCODONE, sodium chloride flush, sodium chloride flush, traMADol     Assessment/Plan    1.Acute systolic HF ->  Cardiogenic Shock  - Due to iCM. EF 20-25% - Impella 5.5 placed on 4/2.   - s/p CABG/MVRepair on 4/6  Now POD #2 - doing well with Impella support and low-dose NE, epi and milrinone - Swan numbers look good - Continue to wean drips (NE first then epi) - Continue impella support, wean to P-4 today. Hopefully can pull tomorrow - continue  lasix gtt for diuresis, CVP 9 today    2. CAD - LHC with severe 3 vessel CAD  - s/p CABG x 3 MVRepair 4/6  - stable  3. Acute hypoxic respiratory failure - intubated 4/1. Extubated 4/4 - Reitubated post CABG MVR. Extubated 4/7 - stable currently on [place]  4. Mitral Regurgitation - Mod-severe on ECHO - s/p MVrepair   5. Uncontrolled DM -Hgb A1C 9.  - On insulin and sliding scale.   6. AKI - due to shock - Scr trending back up 1.4>>1.7 - will closely monitor and continue with hemodynamic support   7. Acute blood loss Anemia  -transfuse as need to keep Hgb >  7.5  8. 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   -Keep  Mag >2   9. Neuropathy -Very limited with severe neuropathy. No sensation R and L foot and occasionaly in his hands. Requires assistance with ADLs.   10. Severe Malnutrition -Prealbumin 8.9  -Nutrition on board - Continue TFs  Length of Stay: [place], PA-C  04/24/2019, 11:10 AM  Advanced Heart Failure Team Pager [phone] (M-F; 7a - 4p)  Please contact [facility] for night-coverage after hours (4p -7a ) and weekends on [email or web address]   Agree with above.   Extubated Remains on Impella 5.5, epi and milrinone. Co-ox ox. Remains volume overloaded. CXR wet. Oxygenation marginal on HFNC.   General:  Weak appearing. On HFNC HEENT: normal Neck: supple. RIJ swanCarotids 2+ bilat; no bruits. No lymphadenopathy or thryomegaly appreciated. JQG:BEEFEOF site and sternotomy stable PMI nondisplaced. Regular rate & rhythm. No rubs, gallops or murmurs. Lungs:coarse Abdomen: soft, nontender, nondistended. No hepatosplenomegaly. No bruits or masses. Good bowel sounds. Extremities: no cyanosis, clubbing, rash, 1+ edema Neuro: alert & orientedx3, cranial nerves grossly intact. moves all 4 extremities w/o difficulty. Affect pleasant  Wean Impella to P-4  Waveforms ok. Diurese aggressively. Possible Impella extraction tomorrow.   CRITICAL CARE Performed by: Glori Bickers  Total critical care time: 35 minutes  Critical care time was exclusive of separately billable procedures and treating other patients.  Critical care was necessary to treat or prevent imminent or life-threatening deterioration.  Critical care was time spent personally by me (independent of midlevel providers or  residents) on the following activities: development of treatment plan with patient and/or surrogate as well as nursing, discussions with consultants, evaluation of patient's response to treatment, examination of patient, obtaining history  from patient or surrogate, ordering and performing treatments and interventions, ordering and review of laboratory studies, ordering and review of radiographic studies, pulse oximetry and re-evaluation of patient's condition.   , MD  6:59 PM    

## 2019-04-24 NOTE — Progress Notes (Signed)
Patient ID: Brendan [name], male   DOB: 05/08/1955, 64 y.o.   MRN: [id number] EVENING ROUNDS NOTE :     [address]       [place] [address]             [phone]                 2 Days Post-Op Procedure(s) (LRB): CORONARY ARTERY BYPASS GRAFTING (CABG) x3, USING LEFT INTERNAL MAMMARY ARTERY AND LEFT LEG GREATER SAPHENOUS VEIN HARVESTED ENDOSCOPICALLY (N/A) MITRAL VALVE (MV) REPAIR USING PHYSIO II RING SIZE 26MM (N/A)  Total Length of Stay:  LOS: 10 days  BP (!) 122/51   Pulse 88   Temp 100 F (37.8 C)   Resp 19   Ht 5\' 11"  (1.803 m) Comment: measured x 3  Wt 73.9 kg   SpO2 95%   BMI 22.72 kg/m   .Intake/Output      04/07 0701 - 04/08 0700 04/08 0701 - 04/09 0700   I.V. (mL/kg) 2701.6 (36.6) 756.5 (10.2)   Blood     Other 114.7 80.8   IV Piggyback 353.2 73.9   Total Intake(mL/kg) 3169.5 (42.9) 911.1 (12.3)   Urine (mL/kg/hr) 3380 (1.9) 565 (0.8)   Blood     Chest Tube 1000 140   Total Output 4380 705   Net -1210.6 +206.1          . sodium chloride Stopped (04/24/19 0551)  . sodium chloride    . sodium chloride 20 mL/hr at 05/12/2019 1840  . albumin human Stopped (04/24/19 1507)  . amiodarone 30 mg/hr (04/24/19 1600)  . ceFEPime (MAXIPIME) IV Stopped (04/24/19 5573)  . epinephrine 3 mcg/min (04/24/19 1600)  . furosemide (LASIX) infusion 8 mg/hr (04/24/19 1600)  . impella catheter heparin 50 unit/mL in dextrose 5%    . lactated ringers    . lactated ringers Stopped (05/12/2019 1640)  . lactated ringers 20 mL/hr at 04/24/19 1600  . milrinone 0.25 mcg/kg/min (04/24/19 1600)  . norepinephrine (LEVOPHED) Adult infusion 15 mcg/min (04/24/19 1600)     Lab Results  Component Value Date   WBC 17.2 (H) 04/24/2019   HGB 8.2 (L) 04/24/2019   HCT 24.6 (L) 04/24/2019   PLT 124 (L) 04/24/2019   GLUCOSE 266 (H) 04/24/2019   CHOL 174 08/21/2017   TRIG 143 08/21/2017   HDL 57 08/21/2017   LDLCALC 88 08/21/2017   ALT 34 04/24/2019   AST 48 (H)  04/24/2019   NA 133 (L) 04/24/2019   K 4.1 04/24/2019   CL 98 04/24/2019   CREATININE 1.88 (H) 04/24/2019   BUN 34 (H) 04/24/2019   CO2 21 (L) 04/24/2019   TSH 0.925 03/20/2019   INR 1.6 (H) 05/06/2019   HGBA1C 9.0 (H) 04/13/2019   MICROALBUR 30 08/21/2017   impella on P4 poss removal tomorrow Milrinone and lasix, levaaphed epi cordrone  567 uop since 7 this am lasix drip 8ggt  Grace Isaac MD  Beeper [phone] Office [phone] 04/24/2019 4:45 PM

## 2019-04-24 NOTE — Progress Notes (Addendum)
[facility] DAILY ICU PROGRESS NOTE                   [address]            [place] [address]          [phone]   2 Days Post-Op Procedure(s) (LRB): CORONARY ARTERY BYPASS GRAFTING (CABG) x3, USING LEFT INTERNAL MAMMARY ARTERY AND LEFT LEG GREATER SAPHENOUS VEIN HARVESTED ENDOSCOPICALLY (N/A) MITRAL VALVE (MV) REPAIR USING PHYSIO II RING SIZE 26MM (N/A)  Total Length of Stay:  LOS: 10 days   Subjective: Patient on bipap this am. He is awake and alert.  Objective: Vital signs in last 24 hours: Temp:  [98.4 F (36.9 C)-100.2 F (37.9 C)] 98.6 F (37 C) (04/08 0700) Pulse Rate:  [81-101] 90 (04/08 0700) Cardiac Rhythm: Atrial paced (04/07 2000) Resp:  [0-27] 16 (04/08 0700) BP: (87-128)/(46-75) 128/50 (04/07 1754) SpO2:  [76 %-100 %] 100 % (04/08 0700) Arterial Line BP: (88-140)/(39-60) 103/51 (04/08 0700) FiO2 (%):  [40 %-100 %] 70 % (04/07 1754) Weight:  [73.9 kg] 73.9 kg (04/08 0500)  Filed Weights   04/24/2019 0443 04/23/19 0306 04/24/19 0500  Weight: 68.3 kg 74.5 kg 73.9 kg    Weight change: -0.6 kg   Hemodynamic parameters for last 24 hours: PAP: (27-42)/(7-20) 30/16 CVP:  [5 mmHg-15 mmHg] 7 mmHg CO:  [5.7 L/min-7.2 L/min] 7.2 L/min CI:  [3 L/min/m2-3.9 L/min/m2] 3.9 L/min/m2  Intake/Output from previous day: 04/07 0701 - 04/08 0700 In: 3169.5 [I.V.:2701.6; IV Piggyback:353.2] Out: 0277 [Urine:3380; Chest Tube:850]  Intake/Output this shift: No intake/output data recorded.  Current Meds: Scheduled Meds: . acetylcysteine  2 mL Nebulization TID  . aspirin EC  325 mg Oral Daily   Or  . aspirin  324 mg Per Tube Daily  . bisacodyl  10 mg Oral Daily   Or  . bisacodyl  10 mg Rectal Daily  . chlorhexidine gluconate (MEDLINE KIT)  15 mL Mouth Rinse BID  . Chlorhexidine Gluconate Cloth  6 each Topical Daily  . docusate sodium  200 mg Oral Daily  . gabapentin  100 mg Oral TID  . insulin aspart  0-9 Units Subcutaneous Q4H  . insulin glargine  8  Units Subcutaneous BID  . mouth rinse  15 mL Mouth Rinse QID  . metoCLOPramide (REGLAN) injection  10 mg Intravenous Q6H  . pantoprazole  40 mg Oral Daily  . pneumococcal 23 valent vaccine  0.5 mL Intramuscular Tomorrow-1000  . rosuvastatin  40 mg Oral q1800  . sodium chloride flush  3 mL Intravenous Q12H   Continuous Infusions: . sodium chloride Stopped (04/24/19 0551)  . sodium chloride    . sodium chloride 20 mL/hr at 04/19/2019 1840  . amiodarone 30 mg/hr (04/24/19 0700)  . ceFEPime (MAXIPIME) IV Stopped (04/24/19 4128)  . epinephrine 3 mcg/min (04/24/19 0700)  . furosemide (LASIX) infusion 8 mg/hr (04/24/19 0700)  . insulin 1 mL/hr at 04/23/19 2300  . lactated ringers    . lactated ringers Stopped (05/16/2019 1640)  . lactated ringers 20 mL/hr at 04/24/19 0700  . milrinone 0.25 mcg/kg/min (04/24/19 0700)  . norepinephrine (LEVOPHED) Adult infusion 15 mcg/min (04/24/19 0700)   PRN Meds:.sodium chloride, dextrose, lactated ringers, levalbuterol, metoprolol tartrate, midazolam, morphine injection, ondansetron (ZOFRAN) IV, oxyCODONE, sodium chloride flush, traMADol  General appearance: alert and no distress Neurologic: intact Heart: Paced Lungs: Diminished basilar breath sounds but mostly clear Abdomen: Soft, non tender, rare bowel sounds this am Extremities: Louretta Par[place]  boats, SCDs in place. Trace LE edema Wound: Aquacel intact. Bilateral LE wounds are clean and dry. There is a rash over anterior chest and abdoment, small area right arm (? from [place])  Lab Results: CBC: Recent Labs    04/23/19 1643 04/23/19 1903 04/24/19 0344 04/24/19 0356  WBC 18.9*  --  17.9*  --   HGB 9.2*  8.8*   < > 8.7* 9.2*  HCT 27.4*  26.0*   < > 25.7* 27.0*  PLT 106*  --  115*  --    < > = values in this interval not displayed.   BMET:  Recent Labs    04/23/19 1643 04/23/19 1903 04/24/19 0344 04/24/19 0356  NA 135  135   < > 135 134*  K 4.4  4.3   < > 4.2 4.2  CL 104  --  102  --   CO2 22   --  21*  --   GLUCOSE 119*  --  182*  --   BUN 22  --  26*  --   CREATININE 1.46*  --  1.76*  --   CALCIUM 7.7*  --  7.7*  --    < > = values in this interval not displayed.    CMET: Lab Results  Component Value Date   WBC 17.9 (H) 04/24/2019   HGB 9.2 (L) 04/24/2019   HCT 27.0 (L) 04/24/2019   PLT 115 (L) 04/24/2019   GLUCOSE 182 (H) 04/24/2019   CHOL 174 08/21/2017   TRIG 143 08/21/2017   HDL 57 08/21/2017   LDLCALC 88 08/21/2017   ALT 34 04/24/2019   AST 48 (H) 04/24/2019   NA 134 (L) 04/24/2019   K 4.2 04/24/2019   CL 102 04/24/2019   CREATININE 1.76 (H) 04/24/2019   BUN 26 (H) 04/24/2019   CO2 21 (L) 04/24/2019   TSH 0.925 04/03/2019   INR 1.6 (H) 05/15/2019   HGBA1C 9.0 (H) 04/09/2019   MICROALBUR 30 08/21/2017    PT/INR:  Recent Labs    04/29/2019 1642  LABPROT 18.8*  INR 1.6*   Radiology: No results found.  Assessment/Plan: S/P Procedure(s) (LRB): CORONARY ARTERY BYPASS GRAFTING (CABG) x3, USING LEFT INTERNAL MAMMARY ARTERY AND LEFT LEG GREATER SAPHENOUS VEIN HARVESTED ENDOSCOPICALLY (N/A) MITRAL VALVE (MV) REPAIR USING PHYSIO II RING SIZE 26MM (N/A)  1. CV- Paced. Impella on P-5. Co ox this am 91 ?Marland Kitchen On Amiodarone 30 mg/hr, Epineprhine 3 mcg/min, Milrinone 0.25 mcg/kg/min, and Nor epi 15 mcg/min drips. Weaning drips as able. Hopefully, hemodynamics improved to be able to remove Impela in am 2. Pulmonary-On bipap this am. Chest tubes with 1000 cc of output last 24 hours. CXR this am appears stable. 3. Expected post op blood loss anemia-H and H this am decreased to 8.7 and 25.7 4. DM-CBGs 128/79/147. On Insulin. He was on Insulin and Metformin 1000 mg bid prior to surgery. Will continue on Insulin for now as creatinine elevated and NPO. Pre op HGA1C 9. He will need close medical follow up after discharge 5. AKI-Creatinine  increased to 1.76 6. Thrombocytopenia-platelets increased to 115,000 7. Acute systolic heart failure-On Lasix drip 8. GI-severe  malnutrition of chronic illness. Nutrition recommended Cortrak, TFs  [name] PA-C 04/24/2019 7:17 AM   pulmonary status improved with diuresis and pulmonary toilette- anticipate removal of Impella 5.5 tomorrow am in OR- speed weaned to P4 Procedure d/w patient Iv amiodarone started for afib CXR with improved aeration of RLL Cont antibiotics Maxepime  patient examined and medical record reviewed,agree with above note. [name] 04/24/2019

## 2019-04-24 NOTE — Progress Notes (Signed)
ANTICOAGULATION CONSULT NOTE  Pharmacy Consult for Heparin Indication: impella 5.5  Heparin Dosing Weight: 68.2 kg  Labs: Recent Labs    04/21/19 1330 05/01/2019 0402 04/24/2019 0811 05/13/2019 1642 05/09/2019 1656 04/23/19 0520 04/23/19 0907 04/23/19 1033 04/23/19 1034 04/23/19 1643 04/23/19 1643 04/23/19 1903 04/23/19 1903 04/24/19 0344 04/24/19 0356  HGB  --  7.4*   < > 8.5*   < > 9.3*   < >  --    < > 9.2*  8.8*   < > 8.8*   < > 8.7* 9.2*  HCT  --  22.3*   < > 24.9*   < > 27.3*   < >  --    < > 27.4*  26.0*   < > 26.0*  --  25.7* 27.0*  PLT  --  114*   < > 66*   < > 93*  --   --   --  106*  --   --   --  115*  --   APTT  --   --   --  41*  --   --   --  46*  --   --   --   --   --  50*  --   LABPROT  --   --   --  18.8*  --   --   --   --   --   --   --   --   --   --   --   INR  --   --   --  1.6*  --   --   --   --   --   --   --   --   --   --   --   HEPARINUNFRC 0.13* <0.10*  --   --   --   --   --   --   --   --   --   --   --  <0.10*  --   CREATININE  --  1.15   < >  --    < > 1.27*  --   --   --  1.46*  --   --   --  1.76*  --    < > = values in this interval not displayed.    Assessment: 64 yo male presenting to [facility] with malaise and SOB for 4 days found to have elevated troponin and new depressed EF. Transferred to [facility] for further evaluation. Patient was started on heparin IV on 3/26, no anticoagulation PTA.   Patient now s/p cath 3/30 showing severe 3-vessel obstructive CAD, high LV filling pressures, reduced cardiac output, and moderate pulmonary HTN.  Underwent CABG on 4/6 - impella 5.5 remains in place. Patient is receiving only standard heparin purge, running at 9 mL/hr (450 units/hr) - no plans for systemic heparin at this time per MD. P5 to P4 today - plan for possible removal 4/9. Heparin level is undetectable as anticipated. Hgb at 9.2 today (received multiple blood products on 4/6), plt up to 115. No s/sx of bleeding.   Goal of Therapy:   Monitor platelets by anticoagulation protocol: Yes   Plan:  Continue heparin purge solution  No systemic heparin at this time Will follow along to see if plan for systemic heparin in future >> possible removal 4/9  Antonietta Jewel, PharmD, BCCCP Clinical Pharmacist  Phone: [phone]  Please check AMION for all [place] phone numbers After 10:00  PM, call [place] [phone] 04/24/2019 11:00 AM

## 2019-04-24 NOTE — Progress Notes (Signed)
Impella purge cassette and purge solution bag changed per manufacturer's instructions. Patient charge form completed for purge cassette.  [employer] RN

## 2019-04-25 ENCOUNTER — Inpatient Hospital Stay (HOSPITAL_COMMUNITY): Payer: Medicaid Other

## 2019-04-25 ENCOUNTER — Encounter (HOSPITAL_COMMUNITY)
Admission: AD | Disposition: E | Payer: Self-pay | Source: Other Acute Inpatient Hospital | Attending: Cardiothoracic Surgery

## 2019-04-25 ENCOUNTER — Inpatient Hospital Stay (HOSPITAL_COMMUNITY): Payer: Medicaid Other | Admitting: Certified Registered"

## 2019-04-25 ENCOUNTER — Encounter (HOSPITAL_COMMUNITY): Payer: Self-pay | Admitting: Cardiothoracic Surgery

## 2019-04-25 DIAGNOSIS — I25110 Atherosclerotic heart disease of native coronary artery with unstable angina pectoris: Secondary | ICD-10-CM

## 2019-04-25 HISTORY — PX: BRONCHIAL WASHINGS: SHX5105

## 2019-04-25 HISTORY — PX: TEE WITHOUT CARDIOVERSION: SHX5443

## 2019-04-25 HISTORY — PX: REMOVAL OF IMPELLA LEFT VENTRICULAR ASSIST DEVICE: SHX6556

## 2019-04-25 LAB — TYPE AND SCREEN
ABO/RH(D): O NEG
Antibody Screen: NEGATIVE
Unit division: 0
Unit division: 0
Unit division: 0
Unit division: 0
Unit division: 0
Unit division: 0
Unit division: 0
Unit division: 0

## 2019-04-25 LAB — PREPARE PLATELET PHERESIS
Unit division: 0
Unit division: 0

## 2019-04-25 LAB — BPAM PLATELET PHERESIS
Blood Product Expiration Date: 202104072359
Blood Product Expiration Date: 202104072359
ISSUE DATE / TIME: 202104061339
ISSUE DATE / TIME: 202104071311
Unit Type and Rh: 5100
Unit Type and Rh: 5100

## 2019-04-25 LAB — POCT I-STAT 7, (LYTES, BLD GAS, ICA,H+H)
Acid-base deficit: 4 mmol/L — ABNORMAL HIGH (ref 0.0–2.0)
Acid-base deficit: 2 mmol/L (ref 0.0–2.0)
Acid-base deficit: 6 mmol/L — ABNORMAL HIGH (ref 0.0–2.0)
Bicarbonate: 24.4 mmol/L (ref 20.0–28.0)
Bicarbonate: 21 mmol/L (ref 20.0–28.0)
Bicarbonate: 23.1 mmol/L (ref 20.0–28.0)
Bicarbonate: 19.3 mmol/L — ABNORMAL LOW (ref 20.0–28.0)
Calcium, Ion: 1.18 mmol/L (ref 1.15–1.40)
Calcium, Ion: 1.16 mmol/L (ref 1.15–1.40)
Calcium, Ion: 1.15 mmol/L (ref 1.15–1.40)
Calcium, Ion: 1.13 mmol/L — ABNORMAL LOW (ref 1.15–1.40)
HCT: 30 % — ABNORMAL LOW (ref 39.0–52.0)
HCT: 26 % — ABNORMAL LOW (ref 39.0–52.0)
HCT: 27 % — ABNORMAL LOW (ref 39.0–52.0)
HCT: 28 % — ABNORMAL LOW (ref 39.0–52.0)
Hemoglobin: 10.2 g/dL — ABNORMAL LOW (ref 13.0–17.0)
Hemoglobin: 8.8 g/dL — ABNORMAL LOW (ref 13.0–17.0)
Hemoglobin: 9.2 g/dL — ABNORMAL LOW (ref 13.0–17.0)
Hemoglobin: 9.5 g/dL — ABNORMAL LOW (ref 13.0–17.0)
O2 Saturation: 95 %
O2 Saturation: 84 %
O2 Saturation: 100 %
O2 Saturation: 93 %
Patient temperature: 36.5
Patient temperature: 35.7
Patient temperature: 35.9
Patient temperature: 36
Potassium: 3.4 mmol/L — ABNORMAL LOW (ref 3.5–5.1)
Potassium: 4 mmol/L (ref 3.5–5.1)
Potassium: 4.1 mmol/L (ref 3.5–5.1)
Potassium: 4.4 mmol/L (ref 3.5–5.1)
Sodium: 134 mmol/L — ABNORMAL LOW (ref 135–145)
Sodium: 134 mmol/L — ABNORMAL LOW (ref 135–145)
Sodium: 134 mmol/L — ABNORMAL LOW (ref 135–145)
Sodium: 133 mmol/L — ABNORMAL LOW (ref 135–145)
TCO2: 26 mmol/L (ref 22–32)
TCO2: 22 mmol/L (ref 22–32)
TCO2: 24 mmol/L (ref 22–32)
TCO2: 20 mmol/L — ABNORMAL LOW (ref 22–32)
pCO2 arterial: 37.7 mmHg (ref 32.0–48.0)
pCO2 arterial: 34.6 mmHg (ref 32.0–48.0)
pCO2 arterial: 40.4 mmHg (ref 32.0–48.0)
pCO2 arterial: 33.8 mmHg (ref 32.0–48.0)
pH, Arterial: 7.417 (ref 7.350–7.450)
pH, Arterial: 7.386 (ref 7.350–7.450)
pH, Arterial: 7.361 (ref 7.350–7.450)
pH, Arterial: 7.36 (ref 7.350–7.450)
pO2, Arterial: 73 mmHg — ABNORMAL LOW (ref 83.0–108.0)
pO2, Arterial: 45 mmHg — ABNORMAL LOW (ref 83.0–108.0)
pO2, Arterial: 171 mmHg — ABNORMAL HIGH (ref 83.0–108.0)
pO2, Arterial: 67 mmHg — ABNORMAL LOW (ref 83.0–108.0)

## 2019-04-25 LAB — BPAM RBC
Blood Product Expiration Date: 202104162359
Blood Product Expiration Date: 202104192359
Blood Product Expiration Date: 202104202359
Blood Product Expiration Date: 202104212359
Blood Product Expiration Date: 202104222359
Blood Product Expiration Date: 202105052359
Blood Product Expiration Date: 202105052359
Blood Product Expiration Date: 202105052359
ISSUE DATE / TIME: 202104060711
ISSUE DATE / TIME: 202104060711
ISSUE DATE / TIME: 202104060711
ISSUE DATE / TIME: 202104060711
ISSUE DATE / TIME: 202104062029
ISSUE DATE / TIME: 202104081806
ISSUE DATE / TIME: 202104090148
Unit Type and Rh: 5100
Unit Type and Rh: 5100
Unit Type and Rh: 5100
Unit Type and Rh: 9500
Unit Type and Rh: 9500
Unit Type and Rh: 9500
Unit Type and Rh: 9500
Unit Type and Rh: 9500

## 2019-04-25 LAB — ECHO INTRAOPERATIVE TEE
Height: 71 in
Weight: 2577 oz

## 2019-04-25 LAB — COMPREHENSIVE METABOLIC PANEL
ALT: 66 U/L — ABNORMAL HIGH (ref 0–44)
AST: 100 U/L — ABNORMAL HIGH (ref 15–41)
Albumin: 2.2 g/dL — ABNORMAL LOW (ref 3.5–5.0)
Alkaline Phosphatase: 160 U/L — ABNORMAL HIGH (ref 38–126)
Anion gap: 13 (ref 5–15)
BUN: 40 mg/dL — ABNORMAL HIGH (ref 8–23)
CO2: 22 mmol/L (ref 22–32)
Calcium: 7.9 mg/dL — ABNORMAL LOW (ref 8.9–10.3)
Chloride: 99 mmol/L (ref 98–111)
Creatinine, Ser: 2.09 mg/dL — ABNORMAL HIGH (ref 0.61–1.24)
GFR calc Af Amer: 38 mL/min — ABNORMAL LOW (ref >60)
GFR calc non Af Amer: 33 mL/min — ABNORMAL LOW (ref >60)
Glucose, Bld: 78 mg/dL (ref 70–99)
Potassium: 3.4 mmol/L — ABNORMAL LOW (ref 3.5–5.1)
Sodium: 134 mmol/L — ABNORMAL LOW (ref 135–145)
Total Bilirubin: 1.7 mg/dL — ABNORMAL HIGH (ref 0.3–1.2)
Total Protein: 5.2 g/dL — ABNORMAL LOW (ref 6.5–8.1)

## 2019-04-25 LAB — CULTURE, BLOOD (ROUTINE X 2)
Culture: NO GROWTH
Culture: NO GROWTH
Special Requests: ADEQUATE

## 2019-04-25 LAB — PHOSPHORUS: Phosphorus: 4 mg/dL (ref 2.5–4.6)

## 2019-04-25 LAB — GLUCOSE, CAPILLARY
Glucose-Capillary: 108 mg/dL — ABNORMAL HIGH (ref 70–99)
Glucose-Capillary: 148 mg/dL — ABNORMAL HIGH (ref 70–99)
Glucose-Capillary: 157 mg/dL — ABNORMAL HIGH (ref 70–99)
Glucose-Capillary: 236 mg/dL — ABNORMAL HIGH (ref 70–99)
Glucose-Capillary: 76 mg/dL (ref 70–99)
Glucose-Capillary: 76 mg/dL (ref 70–99)

## 2019-04-25 LAB — COOXEMETRY PANEL
Carboxyhemoglobin: 1.3 % (ref 0.5–1.5)
Methemoglobin: 1.3 % (ref 0.0–1.5)
O2 Saturation: 70.3 %
Total hemoglobin: 8.3 g/dL — ABNORMAL LOW (ref 12.0–16.0)

## 2019-04-25 LAB — CBC
HCT: 27.7 % — ABNORMAL LOW (ref 39.0–52.0)
Hemoglobin: 9.4 g/dL — ABNORMAL LOW (ref 13.0–17.0)
MCH: 29.1 pg (ref 26.0–34.0)
MCHC: 33.9 g/dL (ref 30.0–36.0)
MCV: 85.8 fL (ref 80.0–100.0)
Platelets: 154 10*3/uL (ref 150–400)
RBC: 3.23 MIL/uL — ABNORMAL LOW (ref 4.22–5.81)
RDW: 14.9 % (ref 11.5–15.5)
WBC: 16.2 10*3/uL — ABNORMAL HIGH (ref 4.0–10.5)
nRBC: 0 % (ref 0.0–0.2)

## 2019-04-25 LAB — APTT: aPTT: 49 seconds — ABNORMAL HIGH (ref 24–36)

## 2019-04-25 LAB — MAGNESIUM: Magnesium: 2.5 mg/dL — ABNORMAL HIGH (ref 1.7–2.4)

## 2019-04-25 LAB — HEPARIN LEVEL (UNFRACTIONATED): Heparin Unfractionated: <0.1 IU/mL — ABNORMAL LOW (ref 0.30–0.70)

## 2019-04-25 SURGERY — REMOVAL, CARDIAC ASSIST DEVICE, IMPELLA
Anesthesia: General | Site: Chest

## 2019-04-25 MED ORDER — ROSUVASTATIN CALCIUM 20 MG PO TABS
40.0000 mg | ORAL_TABLET | Freq: Every day | ORAL | Status: DC
  Administered 2019-04-25: 40 mg
  Filled 2019-04-25: qty 2

## 2019-04-25 MED ORDER — SODIUM CHLORIDE 0.45 % IV SOLN: INTRAVENOUS | Status: DC

## 2019-04-25 MED ORDER — AMIODARONE LOAD VIA INFUSION
150.0000 mg | Freq: Once | INTRAVENOUS | Status: AC
  Administered 2019-04-25: 150 mg via INTRAVENOUS
  Filled 2019-04-25: qty 83.34

## 2019-04-25 MED ORDER — LIDOCAINE 2% (20 MG/ML) 5 ML SYRINGE
INTRAMUSCULAR | Status: AC
  Filled 2019-04-25: qty 5

## 2019-04-25 MED ORDER — FENTANYL CITRATE (PF) 100 MCG/2ML IJ SOLN
INTRAMUSCULAR | Status: DC | PRN
  Administered 2019-04-25: 150 ug via INTRAVENOUS

## 2019-04-25 MED ORDER — PROPOFOL 10 MG/ML IV BOLUS
INTRAVENOUS | Status: DC | PRN
  Administered 2019-04-25: 100 mg via INTRAVENOUS

## 2019-04-25 MED ORDER — SODIUM CHLORIDE 0.9 % IV SOLN
INTRAVENOUS | Status: AC
  Filled 2019-04-25: qty 500000

## 2019-04-25 MED ORDER — ONDANSETRON HCL 4 MG/2ML IJ SOLN
4.0000 mg | Freq: Four times a day (QID) | INTRAMUSCULAR | Status: DC | PRN
  Administered 2019-05-18 – 2019-06-19 (×20): 4 mg via INTRAVENOUS
  Filled 2019-04-25 (×22): qty 2

## 2019-04-25 MED ORDER — INSULIN ASPART 100 UNIT/ML ~~LOC~~ SOLN
0.0000 [IU] | SUBCUTANEOUS | Status: DC
  Administered 2019-04-25: 8 [IU] via SUBCUTANEOUS
  Administered 2019-04-25: 4 [IU] via SUBCUTANEOUS
  Administered 2019-04-25: 2 [IU] via SUBCUTANEOUS
  Administered 2019-04-26: 4 [IU] via SUBCUTANEOUS
  Administered 2019-04-26: 8 [IU] via SUBCUTANEOUS
  Administered 2019-04-26 – 2019-04-27 (×4): 2 [IU] via SUBCUTANEOUS
  Administered 2019-04-27 – 2019-04-28 (×2): 4 [IU] via SUBCUTANEOUS
  Administered 2019-04-28: 2 [IU] via SUBCUTANEOUS
  Administered 2019-04-28: 8 [IU] via SUBCUTANEOUS
  Administered 2019-04-28 – 2019-04-29 (×4): 2 [IU] via SUBCUTANEOUS
  Administered 2019-04-29 (×3): 4 [IU] via SUBCUTANEOUS
  Administered 2019-04-29 – 2019-04-30 (×4): 2 [IU] via SUBCUTANEOUS
  Administered 2019-04-30 (×2): 4 [IU] via SUBCUTANEOUS
  Administered 2019-05-01 (×3): 2 [IU] via SUBCUTANEOUS
  Administered 2019-05-01: 4 [IU] via SUBCUTANEOUS
  Administered 2019-05-02 (×2): 8 [IU] via SUBCUTANEOUS
  Administered 2019-05-02: 12 [IU] via SUBCUTANEOUS
  Administered 2019-05-02: 8 [IU] via SUBCUTANEOUS
  Administered 2019-05-03: 2 [IU] via SUBCUTANEOUS
  Administered 2019-05-03: 01:00:00 8 [IU] via SUBCUTANEOUS
  Administered 2019-05-03 (×2): 2 [IU] via SUBCUTANEOUS
  Administered 2019-05-03: 8 [IU] via SUBCUTANEOUS
  Administered 2019-05-03 – 2019-05-04 (×3): 2 [IU] via SUBCUTANEOUS
  Administered 2019-05-04: 12 [IU] via SUBCUTANEOUS
  Administered 2019-05-04: 4 [IU] via SUBCUTANEOUS
  Administered 2019-05-04: 08:00:00 2 [IU] via SUBCUTANEOUS
  Administered 2019-05-04: 04:00:00 4 [IU] via SUBCUTANEOUS
  Administered 2019-05-05 (×2): 2 [IU] via SUBCUTANEOUS
  Administered 2019-05-05: 16:00:00 4 [IU] via SUBCUTANEOUS
  Administered 2019-05-05 – 2019-05-06 (×4): 2 [IU] via SUBCUTANEOUS
  Administered 2019-05-06 – 2019-05-07 (×2): 4 [IU] via SUBCUTANEOUS
  Administered 2019-05-07 – 2019-05-08 (×3): 2 [IU] via SUBCUTANEOUS
  Administered 2019-05-09: 4 [IU] via SUBCUTANEOUS
  Administered 2019-05-09 – 2019-05-10 (×2): 2 [IU] via SUBCUTANEOUS
  Administered 2019-05-10 (×2): 4 [IU] via SUBCUTANEOUS
  Administered 2019-05-11 (×3): 2 [IU] via SUBCUTANEOUS
  Administered 2019-05-12 (×2): 4 [IU] via SUBCUTANEOUS
  Administered 2019-05-12: 05:00:00 2 [IU] via SUBCUTANEOUS
  Administered 2019-05-13: 8 [IU] via SUBCUTANEOUS
  Administered 2019-05-13 (×3): 2 [IU] via SUBCUTANEOUS
  Administered 2019-05-13: 8 [IU] via SUBCUTANEOUS
  Administered 2019-05-14: 4 [IU] via SUBCUTANEOUS
  Administered 2019-05-14 (×2): 8 [IU] via SUBCUTANEOUS
  Administered 2019-05-14: 2 [IU] via SUBCUTANEOUS
  Administered 2019-05-14: 4 [IU] via SUBCUTANEOUS
  Administered 2019-05-14: 2 [IU] via SUBCUTANEOUS
  Administered 2019-05-14 – 2019-05-15 (×3): 4 [IU] via SUBCUTANEOUS
  Administered 2019-05-15: 8 [IU] via SUBCUTANEOUS
  Administered 2019-05-15: 4 [IU] via SUBCUTANEOUS
  Administered 2019-05-15: 8 [IU] via SUBCUTANEOUS
  Administered 2019-05-16 (×6): 2 [IU] via SUBCUTANEOUS
  Administered 2019-05-17: 4 [IU] via SUBCUTANEOUS
  Administered 2019-05-17: 2 [IU] via SUBCUTANEOUS
  Administered 2019-05-17: 4 [IU] via SUBCUTANEOUS
  Administered 2019-05-17: 2 [IU] via SUBCUTANEOUS
  Administered 2019-05-17: 4 [IU] via SUBCUTANEOUS
  Administered 2019-05-17 – 2019-05-18 (×2): 2 [IU] via SUBCUTANEOUS
  Administered 2019-05-18: 8 [IU] via SUBCUTANEOUS
  Administered 2019-05-18 (×3): 4 [IU] via SUBCUTANEOUS
  Administered 2019-05-19: 2 [IU] via SUBCUTANEOUS
  Administered 2019-05-19 (×2): 8 [IU] via SUBCUTANEOUS
  Administered 2019-05-20 (×3): 2 [IU] via SUBCUTANEOUS

## 2019-04-25 MED ORDER — AMIODARONE IV BOLUS ONLY 150 MG/100ML: 150.0000 mg | Freq: Once | INTRAVENOUS | Status: DC

## 2019-04-25 MED ORDER — ONDANSETRON HCL 4 MG/2ML IJ SOLN
INTRAMUSCULAR | Status: AC
  Filled 2019-04-25: qty 2

## 2019-04-25 MED ORDER — ENOXAPARIN SODIUM 40 MG/0.4ML ~~LOC~~ SOLN
40.0000 mg | Freq: Every day | SUBCUTANEOUS | Status: DC
  Administered 2019-04-26: 40 mg via SUBCUTANEOUS
  Filled 2019-04-25: qty 0.4

## 2019-04-25 MED ORDER — PROPOFOL 10 MG/ML IV BOLUS
INTRAVENOUS | Status: AC
  Filled 2019-04-25: qty 20

## 2019-04-25 MED ORDER — PRO-STAT SUGAR FREE PO LIQD
30.0000 mL | Freq: Every day | ORAL | Status: DC
  Administered 2019-04-26 – 2019-04-29 (×4): 30 mL
  Filled 2019-04-25 (×4): qty 30

## 2019-04-25 MED ORDER — DOCUSATE SODIUM 50 MG/5ML PO LIQD
200.0000 mg | Freq: Every day | ORAL | Status: DC
  Administered 2019-04-26 – 2019-06-10 (×28): 200 mg
  Filled 2019-04-25 (×30): qty 20

## 2019-04-25 MED ORDER — POTASSIUM CHLORIDE 10 MEQ/50ML IV SOLN
10.0000 meq | INTRAVENOUS | Status: AC
  Administered 2019-04-25 (×3): 10 meq via INTRAVENOUS
  Filled 2019-04-25 (×3): qty 50

## 2019-04-25 MED ORDER — DEXAMETHASONE SODIUM PHOSPHATE 10 MG/ML IJ SOLN
INTRAMUSCULAR | Status: DC | PRN
  Administered 2019-04-25: 10 mg via INTRAVENOUS

## 2019-04-25 MED ORDER — SUGAMMADEX SODIUM 200 MG/2ML IV SOLN
INTRAVENOUS | Status: DC | PRN
  Administered 2019-04-25: 200 mg via INTRAVENOUS

## 2019-04-25 MED ORDER — FUROSEMIDE 10 MG/ML IJ SOLN
20.0000 mg | Freq: Once | INTRAMUSCULAR | Status: AC
  Administered 2019-04-25: 20 mg via INTRAVENOUS
  Filled 2019-04-25: qty 2

## 2019-04-25 MED ORDER — PANTOPRAZOLE SODIUM 40 MG PO PACK
40.0000 mg | PACK | Freq: Every day | ORAL | Status: DC
  Administered 2019-04-25 – 2019-05-12 (×18): 40 mg
  Filled 2019-04-25 (×19): qty 20

## 2019-04-25 MED ORDER — METOCLOPRAMIDE HCL 5 MG/ML IJ SOLN
10.0000 mg | Freq: Four times a day (QID) | INTRAMUSCULAR | Status: DC
  Administered 2019-04-25: 10 mg via INTRAVENOUS
  Filled 2019-04-25: qty 2

## 2019-04-25 MED ORDER — FENTANYL CITRATE (PF) 250 MCG/5ML IJ SOLN
INTRAMUSCULAR | Status: AC
  Filled 2019-04-25: qty 5

## 2019-04-25 MED ORDER — ROCURONIUM BROMIDE 10 MG/ML (PF) SYRINGE
PREFILLED_SYRINGE | INTRAVENOUS | Status: DC | PRN
  Administered 2019-04-25: 60 mg via INTRAVENOUS

## 2019-04-25 MED ORDER — VITAL 1.5 CAL PO LIQD
1000.0000 mL | ORAL | Status: DC
  Administered 2019-04-25 – 2019-04-27 (×3): 1000 mL
  Filled 2019-04-25 (×4): qty 1000

## 2019-04-25 MED ORDER — SODIUM CHLORIDE 0.9 % IV SOLN: INTRAVENOUS | Status: DC | PRN

## 2019-04-25 MED ORDER — MIDAZOLAM HCL 2 MG/2ML IJ SOLN
INTRAMUSCULAR | Status: AC
  Filled 2019-04-25: qty 2

## 2019-04-25 MED ORDER — ROCURONIUM BROMIDE 10 MG/ML (PF) SYRINGE
PREFILLED_SYRINGE | INTRAVENOUS | Status: AC
  Filled 2019-04-25: qty 10

## 2019-04-25 MED ORDER — MIDAZOLAM HCL 5 MG/5ML IJ SOLN
INTRAMUSCULAR | Status: DC | PRN
  Administered 2019-04-25: 1 mg via INTRAVENOUS

## 2019-04-25 MED ORDER — FUROSEMIDE 10 MG/ML IJ SOLN
40.0000 mg | Freq: Two times a day (BID) | INTRAMUSCULAR | Status: DC
  Administered 2019-04-25: 40 mg via INTRAVENOUS
  Filled 2019-04-25: qty 4

## 2019-04-25 MED ORDER — LACTATED RINGERS IV SOLN: INTRAVENOUS | Status: DC | PRN

## 2019-04-25 MED ORDER — DEXAMETHASONE SODIUM PHOSPHATE 10 MG/ML IJ SOLN
INTRAMUSCULAR | Status: AC
  Filled 2019-04-25: qty 1

## 2019-04-25 MED ORDER — SODIUM BICARBONATE 8.4 % IV SOLN
50.0000 meq | Freq: Once | INTRAVENOUS | Status: AC
  Administered 2019-04-25: 50 meq via INTRAVENOUS
  Filled 2019-04-25: qty 50

## 2019-04-25 MED ORDER — SODIUM CHLORIDE 0.9 % IV SOLN
INTRAVENOUS | Status: DC | PRN
  Administered 2019-04-25: 500 mL

## 2019-04-25 MED ORDER — GABAPENTIN 250 MG/5ML PO SOLN
300.0000 mg | Freq: Three times a day (TID) | ORAL | Status: DC
  Administered 2019-04-25 – 2019-05-16 (×62): 300 mg
  Filled 2019-04-25 (×65): qty 6

## 2019-04-25 MED ORDER — ONDANSETRON HCL 4 MG/2ML IJ SOLN
INTRAMUSCULAR | Status: DC | PRN
  Administered 2019-04-25: 4 mg via INTRAVENOUS

## 2019-04-25 MED ORDER — BISACODYL 5 MG PO TBEC
10.0000 mg | DELAYED_RELEASE_TABLET | Freq: Every day | ORAL | Status: DC
  Administered 2019-04-26: 10 mg via ORAL
  Filled 2019-04-25: qty 2

## 2019-04-25 SURGICAL SUPPLY — 64 items
ATTRACTOMAT 16X20 MAGNETIC DRP (DRAPES) ×1 IMPLANT
BAG DECANTER FOR FLEXI CONT (MISCELLANEOUS) ×2 IMPLANT
BLADE CLIPPER SURG (BLADE) ×2 IMPLANT
BLADE SURG 12 STRL SS (BLADE) ×2 IMPLANT
CANISTER SUCT 3000ML PPV (MISCELLANEOUS) ×4 IMPLANT
CANISTER WOUNDNEG PRESSURE 500 (CANNISTER) ×2 IMPLANT
COVER SURGICAL LIGHT HANDLE (MISCELLANEOUS) ×2 IMPLANT
DRAPE CHEST BREAST 15X10 FENES (DRAPES) ×4 IMPLANT
DRAPE SLUSH/WARMER DISC (DRAPES) ×4 IMPLANT
DRESSING PREVENA PLUS CUSTOM (GAUZE/BANDAGES/DRESSINGS) IMPLANT
DRSG AQUACEL AG ADV 3.5X 6 (GAUZE/BANDAGES/DRESSINGS) ×6 IMPLANT
DRSG AQUACEL AG ADV 3.5X14 (GAUZE/BANDAGES/DRESSINGS) ×2 IMPLANT
DRSG PREVENA PLUS CUSTOM (GAUZE/BANDAGES/DRESSINGS) ×4
DRSG TEGADERM 2-3/8X2-3/4 SM (GAUZE/BANDAGES/DRESSINGS) ×2 IMPLANT
DRSG TEGADERM 4X4.75 (GAUZE/BANDAGES/DRESSINGS) ×2 IMPLANT
ELECT BLADE 4.0 EZ CLEAN MEGAD (MISCELLANEOUS) ×4
ELECT BLADE 6.5 EXT (BLADE) ×2 IMPLANT
ELECT CAUTERY BLADE 6.4 (BLADE) ×4 IMPLANT
ELECT REM PT RETURN 9FT ADLT (ELECTROSURGICAL) ×8
ELECTRODE BLDE 4.0 EZ CLN MEGD (MISCELLANEOUS) ×2 IMPLANT
ELECTRODE REM PT RTRN 9FT ADLT (ELECTROSURGICAL) ×4 IMPLANT
GAUZE SPONGE 4X4 12PLY STRL (GAUZE/BANDAGES/DRESSINGS) ×8 IMPLANT
GAUZE SPONGE 4X4 12PLY STRL LF (GAUZE/BANDAGES/DRESSINGS) ×2 IMPLANT
GAUZE XEROFORM 1X8 LF (GAUZE/BANDAGES/DRESSINGS) ×3 IMPLANT
GLOVE BIO SURGEON STRL SZ 6.5 (GLOVE) ×2 IMPLANT
GLOVE BIO SURGEON STRL SZ7.5 (GLOVE) ×10 IMPLANT
GLOVE BIO SURGEONS STRL SZ 6.5 (GLOVE) ×1
GLOVE BIOGEL PI IND STRL 6.5 (GLOVE) IMPLANT
GLOVE BIOGEL PI IND STRL 7.5 (GLOVE) ×1 IMPLANT
GLOVE BIOGEL PI INDICATOR 6.5 (GLOVE) ×2
GLOVE BIOGEL PI INDICATOR 7.5 (GLOVE) ×2
GOWN STRL REUS W/ TWL LRG LVL3 (GOWN DISPOSABLE) ×8 IMPLANT
GOWN STRL REUS W/TWL LRG LVL3 (GOWN DISPOSABLE) ×16
HANDLE STAPLE  ENDO EGIA 4 STD (STAPLE) ×4
HANDLE STAPLE ENDO EGIA 4 STD (STAPLE) IMPLANT
KIT BASIN OR (CUSTOM PROCEDURE TRAY) ×1 IMPLANT
KIT REMOVER STAPLE SKIN (MISCELLANEOUS) ×2 IMPLANT
KIT TURNOVER KIT B (KITS) ×4 IMPLANT
NS IRRIG 1000ML POUR BTL (IV SOLUTION) ×8 IMPLANT
PACK GENERAL/GYN (CUSTOM PROCEDURE TRAY) ×1 IMPLANT
PACK OPEN HEART (CUSTOM PROCEDURE TRAY) ×4 IMPLANT
PAD ARMBOARD 7.5X6 YLW CONV (MISCELLANEOUS) ×8 IMPLANT
PAD ELECT DEFIB RADIOL ZOLL (MISCELLANEOUS) ×4 IMPLANT
POSITIONER HEAD DONUT 9IN (MISCELLANEOUS) ×4 IMPLANT
RELOAD TRI 2.0 30 VAS MED SUL (STAPLE) ×2 IMPLANT
SET TUBE SMOKE EVAC HIGH FLOW (TUBING) ×2 IMPLANT
STAPLER VISISTAT 35W (STAPLE) ×3 IMPLANT
SUT PROLENE 4 0 RB 1 (SUTURE)
SUT PROLENE 4-0 RB1 .5 CRCL 36 (SUTURE) ×1 IMPLANT
SUT SILK 1 TIES 10X30 (SUTURE) ×2 IMPLANT
SUT VIC AB 1 CTX 18 (SUTURE) ×3 IMPLANT
SUT VIC AB 1 CTX 36 (SUTURE)
SUT VIC AB 1 CTX36XBRD ANBCTR (SUTURE) ×4 IMPLANT
SUT VIC AB 2-0 CT1 27 (SUTURE)
SUT VIC AB 2-0 CT1 TAPERPNT 27 (SUTURE) ×4 IMPLANT
SUT VIC AB 2-0 CTX 36 (SUTURE) ×6 IMPLANT
SUT VIC AB 3-0 X1 27 (SUTURE) ×4 IMPLANT
SYR 30ML SLIP (SYRINGE) ×2 IMPLANT
TAPE CLOTH SURG 4X10 WHT LF (GAUZE/BANDAGES/DRESSINGS) ×2 IMPLANT
TOWEL GREEN STERILE (TOWEL DISPOSABLE) ×4 IMPLANT
TOWEL GREEN STERILE FF (TOWEL DISPOSABLE) ×2 IMPLANT
TUBE CONNECTING 20'X1/4 (TUBING) ×2
TUBE CONNECTING 20X1/4 (TUBING) ×2 IMPLANT
WATER STERILE IRR 1000ML POUR (IV SOLUTION) ×7 IMPLANT

## 2019-04-25 NOTE — Progress Notes (Signed)
Pre Procedure note for inpatients:   Brendan [name] has been scheduled for Procedure(s): REMOVAL OF IMPELLA 5.5 direct, LEFT VENTRICULAR ASSIST DEVICE.with CELL SAVER, TEE (N/A) TRANSESOPHAGEAL ECHOCARDIOGRAM (TEE) (N/A) today. The various methods of treatment have been discussed with the patient. After consideration of the risks, benefits and treatment options the patient has consented to the planned procedure.   The patient has been seen and labs reviewed. There are no changes in the patient's condition to prevent proceeding with the planned procedure today.  Recent labs:  Lab Results  Component Value Date   WBC 16.2 (H) 04/23/2019   HGB 10.2 (L) 05/16/2019   HCT 30.0 (L) 05/03/2019   PLT 154 05/03/2019   GLUCOSE 78 04/24/2019   CHOL 174 08/21/2017   TRIG 143 08/21/2017   HDL 57 08/21/2017   LDLCALC 88 08/21/2017   ALT 66 (H) 05/08/2019   AST 100 (H) 05/08/2019   NA 134 (L) 05/14/2019   K 3.4 (L) 05/10/2019   CL 99 04/23/2019   CREATININE 2.09 (H) 04/20/2019   BUN 40 (H) 05/01/2019   CO2 22 05/05/2019   TSH 0.925 04/02/2019   INR 1.6 (H) 04/21/2019   HGBA1C 9.0 (H) 04/06/2019   MICROALBUR 30 08/21/2017    Len Childs, MD 04/19/2019 7:29 AM

## 2019-04-25 NOTE — Anesthesia Procedure Notes (Signed)
Procedure Name: Intubation Date/Time: 05/14/2019 8:22 AM Performed by: Moshe Salisbury, CRNA Pre-anesthesia Checklist: Patient identified, Emergency Drugs available, Suction available and Patient being monitored Patient Re-evaluated:Patient Re-evaluated prior to induction Oxygen Delivery Method: Circle System Utilized Preoxygenation: Pre-oxygenation with 100% oxygen Induction Type: IV induction Ventilation: Mask ventilation without difficulty Laryngoscope Size: Mac and 4 Grade View: Grade I Tube type: Oral Tube size: 8.0 mm Number of attempts: 1 Airway Equipment and Method: Stylet Placement Confirmation: ETT inserted through vocal cords under direct vision,  positive ETCO2 and breath sounds checked- equal and bilateral Secured at: 21 cm Tube secured with: Tape Dental Injury: Teeth and Oropharynx as per pre-operative assessment

## 2019-04-25 NOTE — Procedures (Signed)
Cortrak  Person Inserting Tube:  [place] Severs, RD Tube Type:  Cortrak - 43 inches Tube Location:  Left nare Initial Placement:  Stomach Secured by: Bridle Technique Used to Measure Tube Placement:  Documented cm marking at nare/ corner of mouth Cortrak Secured At:  78 cm   No x-ray is required. RN may begin using tube.   If the tube becomes dislodged please keep the tube and contact the Cortrak team at [email or web address] (password [email or web address]) for replacement.  If after hours and replacement cannot be delayed, place a NG tube and confirm placement with an abdominal x-ray.   Mariana Single RD, LDN Clinical Nutrition Pager listed in [place]

## 2019-04-25 NOTE — Progress Notes (Signed)
Patient arrived on 2H on HFNC 12 L O2 sat 87%, color cyanotic, mentation questionable. Patient placed on NRB without success.   ABG obtained see below:  Results for VOLNEY, REIERSON (MRN [id number]) as of 05/10/2019   Ref. Range 04/30/2019 10:01  Sample type Unknown ARTERIAL  pH, Arterial Latest Ref Range: 7.350 - 7.450  7.386  pCO2 arterial Latest Ref Range: 32.0 - 48.0 mmHg 34.6  pO2, Arterial Latest Ref Range: 83.0 - 108.0 mmHg 45.0 (L)  TCO2 Latest Ref Range: 22 - 32 mmol/L 22  Acid-base deficit Latest Ref Range: 0.0 - 2.0 mmol/L 4.0 (H)  Bicarbonate Latest Ref Range: 20.0 - 28.0 mmol/L 21.0  O2 Saturation Latest Units: % 84.0  Patient temperature Unknown 35.7 C  Sodium Latest Ref Range: 135 - 145 mmol/L 134 (L)  Potassium Latest Ref Range: 3.5 - 5.1 mmol/L 4.0  Calcium Ionized Latest Ref Range: 1.15 - 1.40 mmol/L 1.16  Hemoglobin Latest Ref Range: 13.0 - 17.0 g/dL 8.8 (L)  HCT Latest Ref Range: 39.0 - 52.0 % 26.0 (L)    Patient placed on Bipap at 100%. Per Dr. Prescott Gum increase rate from 8 to 12.  ABG post 1hr on Bipap: Results for RICKIE, GANGE (MRN [id number]) as of 04/26/2019   Ref. Range 05/03/2019 11:03  Sample type Unknown ARTERIAL  pH, Arterial Latest Ref Range: 7.350 - 7.450  7.361  pCO2 arterial Latest Ref Range: 32.0 - 48.0 mmHg 40.4  pO2, Arterial Latest Ref Range: 83.0 - 108.0 mmHg 171.0 (H)  TCO2 Latest Ref Range: 22 - 32 mmol/L 24  Acid-base deficit Latest Ref Range: 0.0 - 2.0 mmol/L 2.0  Bicarbonate Latest Ref Range: 20.0 - 28.0 mmol/L 23.1  O2 Saturation Latest Units: % 100.0  Patient temperature Unknown 35.9 C  Sodium Latest Ref Range: 135 - 145 mmol/L 134 (L)  Potassium Latest Ref Range: 3.5 - 5.1 mmol/L 4.1  Calcium Ionized Latest Ref Range: 1.15 - 1.40 mmol/L 1.15  Hemoglobin Latest Ref Range: 13.0 - 17.0 g/dL 9.2 (L)  HCT Latest Ref Range: 39.0 - 52.0 % 27.0 (L)    Patient also went into afib with HR in low 100s.  RN paged TCTS PA. Orders for 150 mg  amiodarone bolus given.  VSS. Patient remains in afib in 100s post bolus.

## 2019-04-25 NOTE — Progress Notes (Signed)
Mucomyst given w/ SAB

## 2019-04-25 NOTE — Anesthesia Preprocedure Evaluation (Addendum)
Anesthesia Evaluation  Patient identified by MRN, date of birth, ID band  Reviewed: Allergy & Precautions, Patient's Chart, lab work & pertinent test results, Unable to perform ROS - Chart review only  History of Anesthesia Complications (+) PONV  Airway Mallampati: II       Dental  (+) Edentulous Upper, Edentulous Lower   Pulmonary    + rhonchi        Cardiovascular hypertension, + CAD, + Past MI and +CHF   Rhythm:Regular Rate:Normal     Neuro/Psych    GI/Hepatic   Endo/Other  diabetes  Renal/GU      Musculoskeletal   Abdominal   Peds  Hematology   Anesthesia Other Findings   Reproductive/Obstetrics                            Anesthesia Physical Anesthesia Plan  ASA: III  Anesthesia Plan: General   Post-op Pain Management:    Induction: Intravenous  PONV Risk Score and Plan: 2 and Dexamethasone and Ondansetron  Airway Management Planned: Oral ETT  Additional Equipment: Arterial line and TEE  Intra-op Plan:   Post-operative Plan: Possible Post-op intubation/ventilation  Informed Consent: I have reviewed the patients History and Physical, chart, labs and discussed the procedure including the risks, benefits and alternatives for the proposed anesthesia with the patient or authorized representative who has indicated his/her understanding and acceptance.       Plan Discussed with: Anesthesiologist and CRNA  Anesthesia Plan Comments:        Anesthesia Quick Evaluation

## 2019-04-25 NOTE — Progress Notes (Signed)
Nutrition Follow-up  DOCUMENTATION CODES:   Severe malnutrition in context of chronic illness  INTERVENTION:   Tube Feeding:  Vital 1.5 at 30 ml/hr; goal rate of 60 ml/hr Pro-Stat 30 mL daily Provides 112 g of protein, 2260 kcals and 1094 mL of free water Meets 100% estimated calorie and protein needs  Monitor magnesium, potassium, and phosphorus, MD to replete as needed, as pt is at risk for refeeding syndrome given severe malnutrition present on admission and poor nutrition since admission. CMP has been ordered daily by MD, RD to add on phosphorus.    NUTRITION DIAGNOSIS:   Severe Malnutrition related to chronic illness as evidenced by severe muscle depletion, severe fat depletion.  Being addressed via TF   GOAL:   Patient will meet greater than or equal to 90% of their needs  Progressing  MONITOR:   Vent status, Diet advancement, Labs, Weight trends, TF tolerance  REASON FOR ASSESSMENT:   Consult Poor PO, Assessment of nutrition requirement/status  ASSESSMENT:   64 yo male admitted with acute systeolic CHF, CAD with 3 vessel disease with plan for CABG. PMH includes DM with HgbA1c 9 with neuropathy, HTN  3/30 Cardiac Cath with severe 3-vessel CAD 3/31 ECHO: EF 30-35% 4/01 IABP placed, Intubated 4/02 Impella placed, bilateral CT placed for pleural effusions 4/04 Extubated 4/06 CABG, MV repair, Intubated 4/07 Extubated 4/09 Impella removed, LVAD placed  Pt returned to ICU from OR on HFNC, placed on BiPap  Remains NPO with inadequate nutrition since admission  Cortrak order placed today; agree that pt needs nutrition support at this time. Noted MD order Vital 1.5 at 30 today. Pt will have to be able to come off Bipap long enough for Cortrak tube to be placed  Labs: reviewed Meds:ss novolog, reglan x 20 doses, ss novolog q 4 hours, lantus  Diet Order:   Diet Order            Diet NPO time specified  Diet effective now              EDUCATION NEEDS:    Education needs have been addressed  Skin:  Skin Assessment: Reviewed RN Assessment  Last BM:  4/9  Height:   Ht Readings from Last 1 Encounters:  05/14/2019 5\' 11"  (1.803 m)    Weight:   Wt Readings from Last 1 Encounters:  05/10/2019 73.1 kg    Ideal Body Weight:     BMI:  Body mass index is 22.46 kg/m.  Estimated Nutritional Needs:   Kcal:  2000-2200 kcals  Protein:  100-115 g  Fluid:  >/= 1.5 L   Kerman Passey MS, RDN, LDN, CNSC RD Pager Number and Weekend/On-Call After Hours Pager Located in [place]

## 2019-04-25 NOTE — Progress Notes (Signed)
Advanced Heart Failure Rounding Note  PCP-Cardiologist: No primary care provider on file.   Subjective:    Events - Presented to [facility] with acute HF  EF 20-25% - Transferred to con - Cath 3/30 with severe 3 vessel disease. EF 25% - Developed PMVT on milrinone -> milrinone stopped -> developed worsening shock -> IABP placed on 4/1 - Clinical deterioration 4/1-> intubated - 4/2 underwent Impella 5.5 placement earlier and bilateral chest tubes with 3L out from each side.  - Extubated 4/4 - 05/16/2019 CABG and MVR - Extubated 4/7 - Back to OR for Impella 5.5 extraction   Back from OR today after Impella 5.5 extraction. Remains intubated.\  On Epi 4, NE 18 and milrinone 0.125  [place]  CVP 9 PA 37/14 Thermo 6.6/3.5    Objective:   Weight Range: 73.1 kg Body mass index is 22.46 kg/m.   Vital Signs:   Temp:  [96.3 F (35.7 C)-100 F (37.8 C)] 97.3 F (36.3 C) (04/09 1500) Pulse Rate:  [65-122] 95 (04/09 1505) Resp:  [0-24] 17 (04/09 1505) BP: (96-111)/(46-65) 110/62 (04/09 1500) SpO2:  [86 %-100 %] 96 % (04/09 1505) Arterial Line BP: (75-134)/(37-58) 113/44 (04/09 1500) FiO2 (%):  [60 %-100 %] 60 % (04/09 1505) Weight:  [73.1 kg] 73.1 kg (04/09 0500) Last BM Date: 04/20/19  Weight change: Filed Weights   04/23/19 0306 04/24/19 0500 05/09/2019 0500  Weight: 74.5 kg 73.9 kg 73.1 kg    Intake/Output:   Intake/Output Summary (Last 24 hours) at 04/24/2019 1742 Last data filed at 05/09/2019 1500 Gross per 24 hour  Intake 3528.89 ml  Output 1695 ml  Net 1833.89 ml      Physical Exam   General:  Intubated sedated HEENT: normal + ETT Neck: supple. RIJ swan. Carotids 2+ bilat; no bruits. No lymphadenopathy or thryomegaly appreciated. Cor: Surgical dressing in place PMI nondisplaced. Regular rate & rhythm. No rubs, gallops or murmurs. + CTs Lungs: coarse Abdomen: soft, nontender, nondistended. No hepatosplenomegaly. No bruits or masses. Good bowel sounds.  Extremities: no cyanosis, clubbing, rash, 1+ edema Neuro: Intubated sedated     Telemetry   Apaced 90s Personally reviewed  Labs    CBC Recent Labs    04/24/19 1543 04/24/19 1724 04/24/2019 0316 04/21/2019 0336 05/06/2019 1103 04/21/2019 1543  WBC 17.2*  --  16.2*  --   --   --   HGB 8.2*   < > 9.4*   < > 9.2* 9.5*  HCT 24.6*   < > 27.7*   < > 27.0* 28.0*  MCV 86.9  --  85.8  --   --   --   PLT 124*  --  154  --   --   --    < > = values in this interval not displayed.   Basic Metabolic Panel Recent Labs    04/23/19 1643 04/23/19 1903 04/24/19 1543 04/24/19 1724 04/29/2019 0316 05/05/2019 0336 04/30/2019 1103 05/05/2019 1543  NA 135  135   < > 133*   < > 134*   < > 134* 133*  K 4.4  4.3   < > 4.1   < > 3.4*   < > 4.1 4.4  CL 104   < > 98  --  99  --   --   --   CO2 22   < > 21*  --  22  --   --   --   GLUCOSE 119*   < > 266*  --  78  --   --   --   BUN 22   < > 34*  --  40*  --   --   --   CREATININE 1.46*   < > 1.88*  --  2.09*  --   --   --   CALCIUM 7.7*   < > 7.6*  --  7.9*  --   --   --   MG 2.8*  --   --   --  2.5*  --   --   --   PHOS  --   --   --   --  4.0  --   --   --    < > = values in this interval not displayed.   Liver Function Tests Recent Labs    04/24/19 0344 05/03/2019 0316  AST 48* 100*  ALT 34 66*  ALKPHOS 99 160*  BILITOT 1.9* 1.7*  PROT 4.6* 5.2*  ALBUMIN 2.2* 2.2*   No results for input(s): LIPASE, AMYLASE in the last 72 hours. Cardiac Enzymes No results for input(s): CKTOTAL, CKMB, CKMBINDEX, TROPONINI in the last 72 hours.  BNP: BNP (last 3 results) Recent Labs    03/24/2019 0113  BNP 655.7*    ProBNP (last 3 results) No results for input(s): PROBNP in the last 8760 hours.   D-Dimer No results for input(s): DDIMER in the last 72 hours. Hemoglobin A1C No results for input(s): HGBA1C in the last 72 hours. Fasting Lipid Panel No results for input(s): CHOL, HDL, LDLCALC, TRIG, CHOLHDL, LDLDIRECT in the last 72 hours. Thyroid  Function Tests No results for input(s): TSH, T4TOTAL, T3FREE, THYROIDAB in the last 72 hours.  Invalid input(s): FREET3  Other results:   Imaging    DG CHEST PORT 1 VIEW  Result Date: 05/12/2019 CLINICAL DATA:  Chest tube present. Pleural effusion. EXAM: PORTABLE CHEST 1 VIEW COMPARISON:  One-view chest x-ray 04/24/2019 FINDINGS: Heart is mildly enlarged. Bilateral chest tubes remain in place. Diffuse interstitial pattern has increased. Bilateral effusions are noted. No significant pneumothorax is present. IMPRESSION: 1. Increasing interstitial pattern compatible with congestive heart failure. 2. Bilateral chest tubes without significant pneumothorax. 3. Bilateral pleural effusions. Electronically Signed   By: San Morelle M.D.   On: 04/21/2019 08:29     Medications:     Scheduled Medications: . acetylcysteine  2 mL Nebulization TID  . amiodarone  150 mg Intravenous Once  . aspirin EC  325 mg Oral Daily   Or  . aspirin  324 mg Per Tube Daily  . bisacodyl  10 mg Oral Daily   Or  . bisacodyl  10 mg Rectal Daily  . bisacodyl  10 mg Oral Daily  . chlorhexidine gluconate (MEDLINE KIT)  15 mL Mouth Rinse BID  . Chlorhexidine Gluconate Cloth  6 each Topical Daily  . [START ON 04/26/2019] docusate  200 mg Per Tube Daily  . [START ON 04/26/2019] enoxaparin (LOVENOX) injection  40 mg Subcutaneous Daily  . feeding supplement (ENSURE ENLIVE)  237 mL Oral TID BM  . feeding supplement (PRO-STAT SUGAR FREE 64)  30 mL Per Tube Daily  . furosemide  40 mg Intravenous BID  . gabapentin  300 mg Per Tube Q8H  . insulin aspart  0-15 Units Subcutaneous Q4H  . insulin aspart  0-24 Units Subcutaneous Q4H  . insulin glargine  8 Units Subcutaneous BID  . mouth rinse  15 mL Mouth Rinse BID  . metoCLOPramide (REGLAN) injection  10 mg Intravenous  Q6H  . pantoprazole sodium  40 mg Per Tube Daily  . pneumococcal 23 valent vaccine  0.5 mL Intramuscular Tomorrow-1000  . rosuvastatin  40 mg Per Tube  q1800  . sodium bicarbonate  50 mEq Intravenous Once  . sodium chloride flush  10-40 mL Intracatheter Q12H  . sodium chloride flush  3 mL Intravenous Q12H    Infusions: . sodium chloride 20 mL/hr at 05/11/2019 1500  . sodium chloride    . sodium chloride 250 mL (05/15/2019 1021)  . sodium chloride 20 mL/hr at 05/07/2019 1840  . sodium chloride    . amiodarone 30 mg/hr (04/21/2019 1500)  . ceFEPime (MAXIPIME) IV Stopped (05/04/2019 0542)  . epinephrine 4 mcg/min (04/29/2019 1642)  . feeding supplement (VITAL 1.5 CAL)    . lactated ringers Stopped (04/23/2019 1640)  . lactated ringers 20 mL/hr at 05/14/2019 1500  . milrinone 0.125 mcg/kg/min (05/04/2019 1500)  . norepinephrine (LEVOPHED) Adult infusion 18 mcg/min (05/09/2019 1200)    PRN Medications: sodium chloride, Place/Maintain arterial line **AND** sodium chloride, levalbuterol, metoprolol tartrate, midazolam, morphine injection, ondansetron (ZOFRAN) IV, ondansetron (ZOFRAN) IV, oxyCODONE, sodium chloride flush, sodium chloride flush, traMADol     Assessment/Plan    1.Acute systolic HF ->  Cardiogenic Shock  - Due to iCM. EF 20-25% - Impella 5.5 placed on 4/2.   - s/p CABG/MVRepair on 4/6  Now POD #3  - Impella out today. - hemodynamics look good on Epi 5, NE 19 and milrinone 0.125 - wean epi and NE. Continue diuresis.  - extubate later today - d/w Dr. Prescott Gum  - add GDMT as BP permits  2. CAD - LHC with severe 3 vessel CAD  - s/p CABG x 3 MVRepair 4/6  - doing well - on ASA/statin  3. Acute hypoxic respiratory failure - remains intubated after impella extraction - likely extubate later today  4. Mitral Regurgitation - Mod-severe on ECHO - s/p MVrepair   5. Uncontrolled DM -Hgb A1C 9.  - On insulin and sliding scale.   6. AKI - due to shock - Scr trending back up 1.4>>1.7> 2.1 - will closely monitor.continue hemodynamic support  7. Acute blood loss Anemia  -transfuse as need to keep Hgb > 7.5  8. Polymorphic VT  - Quiescent on amio -Keep K >4.  K 3.4. will supp  -Keep  Mag >2   9. Neuropathy -Very limited with severe neuropathy. No sensation R and L foot and occasionaly in his hands. Requires assistance with ADLs.   10. Severe Malnutrition -Prealbumin 8.9  -Nutrition on board - Continue TFs  CRITICAL CARE Performed by: Glori Bickers  Total critical care time: 35 minutes  Critical care time was exclusive of separately billable procedures and treating other patients.  Critical care was necessary to treat or prevent imminent or life-threatening deterioration.  Critical care was time spent personally by me (independent of midlevel providers or residents) on the following activities: development of treatment plan with patient and/or surrogate as well as nursing, discussions with consultants, evaluation of patient's response to treatment, examination of patient, obtaining history from patient or surrogate, ordering and performing treatments and interventions, ordering and review of laboratory studies, ordering and review of radiographic studies, pulse oximetry and re-evaluation of patient's condition.   Length of Stay: 35  Glori Bickers, MD  05/14/2019, 5:42 PM  Advanced Heart Failure Team Pager [phone] (M-F; 7a - 4p)  Please contact [facility] for night-coverage after hours (4p -7a ) and weekends on [email or web address]

## 2019-04-25 NOTE — Progress Notes (Signed)
  Echocardiogram Echocardiogram Transesophageal has been performed.  Brendan [name] 04/24/2019, 8:54 AM

## 2019-04-25 NOTE — Transfer of Care (Signed)
Immediate Anesthesia Transfer of Care Note  Patient: Brendan [name]  Procedure(s) Performed: REMOVAL OF IMPELLA 5.5 direct, LEFT VENTRICULAR ASSIST DEVICE.with CELL SAVER, TEE (N/A Chest) TRANSESOPHAGEAL ECHOCARDIOGRAM (TEE) (N/A )  Patient Location: ICU  Anesthesia Type:General  Level of Consciousness: drowsy and patient cooperative  Airway & Oxygen Therapy: Patient Spontanous Breathing and Patient connected to nasal cannula oxygen  Post-op Assessment: Report given to RN, Post -op Vital signs reviewed and stable and Patient moving all extremities  Post vital signs: Reviewed and stable  Last Vitals:  Vitals Value Taken Time  BP    Temp    Pulse    Resp    SpO2      Last Pain:  Vitals:   04/24/19 2135  TempSrc:   PainSc: Asleep      Patients Stated Pain Goal: 3 (03/70/48 8891)  Complications: No apparent anesthesia complications

## 2019-04-25 NOTE — Brief Op Note (Signed)
04/05/2019 - 05/16/2019  9:33 AM  PATIENT:  Brendan [name]  64 y.o. male  PRE-OPERATIVE DIAGNOSIS:  mitral regurgation, CORONARY ARTERY DISEASE, CONGESTIVE HEART FAILURE  POST-OPERATIVE DIAGNOSIS:  mitral regurgation, CORONARY ARTERY DISEASE, CONGESTIVE HEART FAILURE  PROCEDURE:  Procedure(s): REMOVAL OF IMPELLA 5.5 direct, LEFT VENTRICULAR ASSIST DEVICE.with CELL SAVER, TEE (N/A) TRANSESOPHAGEAL ECHOCARDIOGRAM (TEE) (N/A)  Video Bronchoscopy  SURGEON:  Surgeon(s) and Role:    Ivin Poot, MD - Primary  PHYSICIAN ASSISTANT:   ASSISTANTS: none   ANESTHESIA:   general  EBL:10 ml  BLOOD ADMINISTERED:none  DRAINS: none   LOCAL MEDICATIONS USED:  NONE  SPECIMEN:  Aspirate   Bronchial washing DISPOSITION OF SPECIMEN:  microboilogy  COUNTS:  YES  TOURNIQUET:  * No tourniquets in log *  DICTATION: .Dragon Dictation  PLAN OF CARE: return to ICU  PATIENT DISPOSITION:  ICU - extubated and stable.   Delay start of Pharmacological VTE agent (>24hrs) due to surgical blood loss or risk of bleeding: yes

## 2019-04-25 NOTE — Progress Notes (Signed)
Nebs given via utilization of metaneb. Patient is stable at this time

## 2019-04-25 NOTE — Op Note (Signed)
NAME: Brendan [name], Brendan [name] MEDICAL RECORD [id number] ACCOUNT [id number] DATE OF BIRTH:March 10, 1955 FACILITY: [facility] LOCATION: [facility] PHYSICIAN:Tashawn Greff VAN TRIGT III, MD  OPERATIVE REPORT  DATE OF PROCEDURE:  05/04/2019  OPERATIONS: 1.  Removal of Impella 5.5 percutaneous left ventricular assist device via right axillary artery access. 2.  Video bronchoscopy.  SURGEON:  Ivin Poot, MD  PREOPERATIVE DIAGNOSES:   1.  History of ischemic cardiomyopathy.  2.  Acute myocardial infarction.  3.  Severe coronary disease with cardiogenic shock.  4.  Preoperative Impella 5.5 left ventricular assist device support. 5.  Recent coronary artery bypass graft x3 and mitral valve repair.  POSTOPERATIVE DIAGNOSES:   1.  History of ischemic cardiomyopathy.  2.  Acute myocardial infarction.  3.  Severe coronary disease with cardiogenic shock.  4.  Preoperative Impella 5.5 left ventricular assist device support. 5.  Recent coronary artery bypass graft x3 and mitral valve repair.  ANESTHESIA:  General by Dr. Linna Caprice.  DESCRIPTION OF PROCEDURE:  The patient was brought directly from the ICU to the operating room after informed consent was documented and the procedure had been discussed with the patient and all issues addressed.  The patient was extubated and  conversant.  The patient was placed supine on the operating table and general anesthesia was induced and the patient was intubated.  He remained stable.  A transesophageal echo probe was placed by the anesthesia team.  This showed ejection fraction of 40% with scant  mitral regurgitation.  RV function appeared to be preserved.  The chest, neck and abdomen were prepped and draped as a sterile field.  A proper time-out was performed.  The axillary artery incision was opened.  It was irrigated with antibiotic irrigation.  The catheter speed was turned successfully from P4 down to  the P3 and the patient was observed.  Hemodynamics remained stable.   The catheter was removed from the heart into the graft and the catheter was turned off.  The catheter was then removed in its entirety and the vascular graft was clamped.  It was  flushed of any remaining thrombus.  The patient was observed and remained stable.  The graft was then doubly ligated and divided with a vascular stapler.  The wound was irrigated and then closed in layers using Vicryl and staples for the skin.  A sterile dressing was applied.  Next, a video bronchoscopy was performed via the endotracheal tube.  A proper time-out was performed.  The distal trachea and carina appeared normal.  There were some thin secretions from the right upper lobe, right middle lobe and right lower lobe.   These were irrigated and washings sent for culture.  The bronchoscope was passed down the left main stem bronchus and the endobronchial appearance appeared to be normal.  The bronchoscope was then withdrawn.  The patient was then observed by anesthesia,  extubated and transferred back to the ICU.  VN/NUANCE  D:04/24/2019 T:04/20/2019 JOB:[id number]

## 2019-04-25 NOTE — Progress Notes (Signed)
Patient transported to OR by OR staff via bed.

## 2019-04-25 NOTE — Plan of Care (Signed)

## 2019-04-26 ENCOUNTER — Inpatient Hospital Stay (HOSPITAL_COMMUNITY): Payer: Medicaid Other

## 2019-04-26 DIAGNOSIS — N170 Acute kidney failure with tubular necrosis: Secondary | ICD-10-CM

## 2019-04-26 DIAGNOSIS — I5023 Acute on chronic systolic (congestive) heart failure: Secondary | ICD-10-CM

## 2019-04-26 LAB — POCT I-STAT 7, (LYTES, BLD GAS, ICA,H+H)
Acid-base deficit: 4 mmol/L — ABNORMAL HIGH (ref 0.0–2.0)
Acid-base deficit: 6 mmol/L — ABNORMAL HIGH (ref 0.0–2.0)
Acid-base deficit: 1 mmol/L (ref 0.0–2.0)
Acid-base deficit: 2 mmol/L (ref 0.0–2.0)
Bicarbonate: 20.3 mmol/L (ref 20.0–28.0)
Bicarbonate: 18.4 mmol/L — ABNORMAL LOW (ref 20.0–28.0)
Bicarbonate: 23.4 mmol/L (ref 20.0–28.0)
Bicarbonate: 23.2 mmol/L (ref 20.0–28.0)
Calcium, Ion: 1.1 mmol/L — ABNORMAL LOW (ref 1.15–1.40)
Calcium, Ion: 1.09 mmol/L — ABNORMAL LOW (ref 1.15–1.40)
Calcium, Ion: 1.07 mmol/L — ABNORMAL LOW (ref 1.15–1.40)
Calcium, Ion: 1.06 mmol/L — ABNORMAL LOW (ref 1.15–1.40)
HCT: 27 % — ABNORMAL LOW (ref 39.0–52.0)
HCT: 27 % — ABNORMAL LOW (ref 39.0–52.0)
HCT: 26 % — ABNORMAL LOW (ref 39.0–52.0)
HCT: 27 % — ABNORMAL LOW (ref 39.0–52.0)
Hemoglobin: 9.2 g/dL — ABNORMAL LOW (ref 13.0–17.0)
Hemoglobin: 9.2 g/dL — ABNORMAL LOW (ref 13.0–17.0)
Hemoglobin: 8.8 g/dL — ABNORMAL LOW (ref 13.0–17.0)
Hemoglobin: 9.2 g/dL — ABNORMAL LOW (ref 13.0–17.0)
O2 Saturation: 92 %
O2 Saturation: 89 %
O2 Saturation: 99 %
O2 Saturation: 99 %
Patient temperature: 35.9
Patient temperature: 35.7
Patient temperature: 36
Patient temperature: 36
Potassium: 4.2 mmol/L (ref 3.5–5.1)
Potassium: 4.2 mmol/L (ref 3.5–5.1)
Potassium: 4.1 mmol/L (ref 3.5–5.1)
Potassium: 4 mmol/L (ref 3.5–5.1)
Sodium: 133 mmol/L — ABNORMAL LOW (ref 135–145)
Sodium: 133 mmol/L — ABNORMAL LOW (ref 135–145)
Sodium: 135 mmol/L (ref 135–145)
Sodium: 136 mmol/L (ref 135–145)
TCO2: 21 mmol/L — ABNORMAL LOW (ref 22–32)
TCO2: 19 mmol/L — ABNORMAL LOW (ref 22–32)
TCO2: 25 mmol/L (ref 22–32)
TCO2: 24 mmol/L (ref 22–32)
pCO2 arterial: 32.5 mmHg (ref 32.0–48.0)
pCO2 arterial: 31.2 mmHg — ABNORMAL LOW (ref 32.0–48.0)
pCO2 arterial: 36.3 mmHg (ref 32.0–48.0)
pCO2 arterial: 37.3 mmHg (ref 32.0–48.0)
pH, Arterial: 7.398 (ref 7.350–7.450)
pH, Arterial: 7.372 (ref 7.350–7.450)
pH, Arterial: 7.412 (ref 7.350–7.450)
pH, Arterial: 7.398 (ref 7.350–7.450)
pO2, Arterial: 60 mmHg — ABNORMAL LOW (ref 83.0–108.0)
pO2, Arterial: 54 mmHg — ABNORMAL LOW (ref 83.0–108.0)
pO2, Arterial: 122 mmHg — ABNORMAL HIGH (ref 83.0–108.0)
pO2, Arterial: 138 mmHg — ABNORMAL HIGH (ref 83.0–108.0)

## 2019-04-26 LAB — COMPREHENSIVE METABOLIC PANEL
ALT: 194 U/L — ABNORMAL HIGH (ref 0–44)
AST: 566 U/L — ABNORMAL HIGH (ref 15–41)
Albumin: 1.9 g/dL — ABNORMAL LOW (ref 3.5–5.0)
Alkaline Phosphatase: 328 U/L — ABNORMAL HIGH (ref 38–126)
Anion gap: 13 (ref 5–15)
BUN: 56 mg/dL — ABNORMAL HIGH (ref 8–23)
CO2: 20 mmol/L — ABNORMAL LOW (ref 22–32)
Calcium: 7.5 mg/dL — ABNORMAL LOW (ref 8.9–10.3)
Chloride: 100 mmol/L (ref 98–111)
Creatinine, Ser: 2.84 mg/dL — ABNORMAL HIGH (ref 0.61–1.24)
GFR calc Af Amer: 27 mL/min — ABNORMAL LOW (ref >60)
GFR calc non Af Amer: 23 mL/min — ABNORMAL LOW (ref >60)
Glucose, Bld: 214 mg/dL — ABNORMAL HIGH (ref 70–99)
Potassium: 4.3 mmol/L (ref 3.5–5.1)
Sodium: 133 mmol/L — ABNORMAL LOW (ref 135–145)
Total Bilirubin: 1.2 mg/dL (ref 0.3–1.2)
Total Protein: 5 g/dL — ABNORMAL LOW (ref 6.5–8.1)

## 2019-04-26 LAB — URINALYSIS, ROUTINE W REFLEX MICROSCOPIC
Bilirubin Urine: NEGATIVE
Glucose, UA: NEGATIVE mg/dL
Ketones, ur: 5 mg/dL — AB
Nitrite: NEGATIVE
Protein, ur: 100 mg/dL — AB
Specific Gravity, Urine: 1.025 (ref 1.005–1.030)
pH: 5 (ref 5.0–8.0)

## 2019-04-26 LAB — CBC
HCT: 27.1 % — ABNORMAL LOW (ref 39.0–52.0)
Hemoglobin: 9.1 g/dL — ABNORMAL LOW (ref 13.0–17.0)
MCH: 28.8 pg (ref 26.0–34.0)
MCHC: 33.6 g/dL (ref 30.0–36.0)
MCV: 85.8 fL (ref 80.0–100.0)
Platelets: 196 10*3/uL (ref 150–400)
RBC: 3.16 MIL/uL — ABNORMAL LOW (ref 4.22–5.81)
RDW: 15.5 % (ref 11.5–15.5)
WBC: 17 10*3/uL — ABNORMAL HIGH (ref 4.0–10.5)
nRBC: 0.2 % (ref 0.0–0.2)

## 2019-04-26 LAB — GLUCOSE, CAPILLARY
Glucose-Capillary: 192 mg/dL — ABNORMAL HIGH (ref 70–99)
Glucose-Capillary: 203 mg/dL — ABNORMAL HIGH (ref 70–99)
Glucose-Capillary: 130 mg/dL — ABNORMAL HIGH (ref 70–99)
Glucose-Capillary: 109 mg/dL — ABNORMAL HIGH (ref 70–99)
Glucose-Capillary: 136 mg/dL — ABNORMAL HIGH (ref 70–99)

## 2019-04-26 LAB — SODIUM, URINE, RANDOM: Sodium, Ur: 32 mmol/L

## 2019-04-26 LAB — COOXEMETRY PANEL
Carboxyhemoglobin: 0.7 % (ref 0.5–1.5)
Methemoglobin: 0.9 % (ref 0.0–1.5)
O2 Saturation: 60.4 %
Total hemoglobin: 11.2 g/dL — ABNORMAL LOW (ref 12.0–16.0)

## 2019-04-26 LAB — RENAL FUNCTION PANEL
Albumin: 1.8 g/dL — ABNORMAL LOW (ref 3.5–5.0)
Anion gap: 13 (ref 5–15)
BUN: 59 mg/dL — ABNORMAL HIGH (ref 8–23)
CO2: 21 mmol/L — ABNORMAL LOW (ref 22–32)
Calcium: 7.4 mg/dL — ABNORMAL LOW (ref 8.9–10.3)
Chloride: 101 mmol/L (ref 98–111)
Creatinine, Ser: 3.01 mg/dL — ABNORMAL HIGH (ref 0.61–1.24)
GFR calc Af Amer: 25 mL/min — ABNORMAL LOW (ref >60)
GFR calc non Af Amer: 21 mL/min — ABNORMAL LOW (ref >60)
Glucose, Bld: 140 mg/dL — ABNORMAL HIGH (ref 70–99)
Phosphorus: 4.2 mg/dL (ref 2.5–4.6)
Potassium: 4.2 mmol/L (ref 3.5–5.1)
Sodium: 135 mmol/L (ref 135–145)

## 2019-04-26 LAB — CREATININE, URINE, RANDOM: Creatinine, Urine: 90.44 mg/dL

## 2019-04-26 LAB — ECHOCARDIOGRAM LIMITED
Height: 71 in
Weight: 2754.87 oz

## 2019-04-26 LAB — PHOSPHORUS: Phosphorus: 5 mg/dL — ABNORMAL HIGH (ref 2.5–4.6)

## 2019-04-26 MED ORDER — ROSUVASTATIN CALCIUM 5 MG PO TABS
10.0000 mg | ORAL_TABLET | Freq: Every day | ORAL | Status: DC
  Administered 2019-04-26 – 2019-05-04 (×9): 10 mg
  Filled 2019-04-26 (×9): qty 2

## 2019-04-26 MED ORDER — FUROSEMIDE 10 MG/ML IJ SOLN
8.0000 mg/h | INTRAVENOUS | Status: DC
  Administered 2019-04-26: 8 mg/h via INTRAVENOUS
  Filled 2019-04-26: qty 25

## 2019-04-26 MED ORDER — SODIUM BICARBONATE 8.4 % IV SOLN
100.0000 meq | Freq: Once | INTRAVENOUS | Status: AC
  Administered 2019-04-26: 100 meq via INTRAVENOUS
  Filled 2019-04-26: qty 100

## 2019-04-26 MED ORDER — SODIUM CHLORIDE 0.9 % IV SOLN
1000.0000 [IU]/h | INTRAVENOUS | Status: DC
  Administered 2019-04-27: 750 [IU]/h via INTRAVENOUS_CENTRAL
  Administered 2019-04-27: 500 [IU]/h via INTRAVENOUS_CENTRAL
  Administered 2019-04-28: 750 [IU]/h via INTRAVENOUS_CENTRAL
  Administered 2019-04-28 – 2019-05-15 (×41): 1000 [IU]/h via INTRAVENOUS_CENTRAL
  Filled 2019-04-26 (×5): qty 2
  Filled 2019-04-26: qty 10000
  Filled 2019-04-26 (×6): qty 2
  Filled 2019-04-26: qty 10000
  Filled 2019-04-26: qty 2
  Filled 2019-04-26: qty 10000
  Filled 2019-04-26 (×2): qty 2
  Filled 2019-04-26 (×2): qty 10000
  Filled 2019-04-26 (×2): qty 2
  Filled 2019-04-26 (×3): qty 10000
  Filled 2019-04-26 (×7): qty 2
  Filled 2019-04-26 (×3): qty 10000
  Filled 2019-04-26 (×2): qty 2
  Filled 2019-04-26: qty 10000
  Filled 2019-04-26: qty 2
  Filled 2019-04-26 (×2): qty 10000
  Filled 2019-04-26 (×6): qty 2

## 2019-04-26 MED ORDER — SODIUM CHLORIDE 0.9 % IV SOLN
500.0000 [IU]/h | INTRAVENOUS | Status: DC
  Administered 2019-04-26: 500 [IU]/h via INTRAVENOUS_CENTRAL
  Filled 2019-04-26: qty 2

## 2019-04-26 MED ORDER — FUROSEMIDE 10 MG/ML IJ SOLN
40.0000 mg | Freq: Once | INTRAMUSCULAR | Status: AC
  Administered 2019-04-26: 40 mg via INTRAVENOUS
  Filled 2019-04-26: qty 4

## 2019-04-26 MED ORDER — PRISMASOL BGK 4/2.5 32-4-2.5 MEQ/L IV SOLN
INTRAVENOUS | Status: DC
  Administered 2019-05-12 – 2019-05-13 (×2): 1800 mL/h via INTRAVENOUS_CENTRAL

## 2019-04-26 MED ORDER — ALTEPLASE 2 MG IJ SOLR: 2.0000 mg | Freq: Once | INTRAMUSCULAR | Status: DC | PRN

## 2019-04-26 MED ORDER — PRISMASOL BGK 4/2.5 32-4-2.5 MEQ/L REPLACEMENT SOLN: Status: DC

## 2019-04-26 MED ORDER — HEPARIN SODIUM (PORCINE) 1000 UNIT/ML DIALYSIS
1000.0000 [IU] | INTRAMUSCULAR | Status: DC | PRN
  Administered 2019-04-27 – 2019-05-02 (×2): 3200 [IU] via INTRAVENOUS_CENTRAL
  Filled 2019-04-26: qty 4
  Filled 2019-04-26 (×3): qty 6
  Filled 2019-04-26: qty 2
  Filled 2019-04-26 (×2): qty 6

## 2019-04-26 MED ORDER — HEPARIN BOLUS VIA INFUSION (CRRT)
1000.0000 [IU] | INTRAVENOUS | Status: DC | PRN
  Filled 2019-04-26: qty 1000

## 2019-04-26 MED ORDER — SODIUM CHLORIDE 0.9 % FOR CRRT: INTRAVENOUS_CENTRAL | Status: DC | PRN

## 2019-04-26 NOTE — Consult Note (Signed)
Renal Service Consult Note [facility]  KARL ERWAY 04/26/2019 Sol Blazing Requesting Physician:  Dr Aundra Dubin  Reason for Consult:  AKI in patient post CABG w/ cardiogenic shock HPI: The patient is a 64 y.o. year-old presented to [facility] on 3/26 w/ SOB found to have new low EF by echo, also NSTEMI. Tx'd to [facility] on 3/29, LHC showed 3VCAD.  He rec'd IABP, swan, lasix / diuresis, IV milrinone. Developed NSVT and rec'd amiodarone, lidocaine gtt's. Went for CABG on 4/2 w/ Impella placement due to low EF. Admit creat was 0.99. Creat rising last few days up to 2.84 today w/ BUN 48 and K 4.0.  Pt resp status has worsened, on Bipap now. Asked by cardiology to see for CRRT.   Pt seen in ICU, responds to questions.   ROS  denies CP  no joint pain   no HA  no blurry vision  no rash  no diarrhea  no nausea/ vomiting   Past Medical History  Past Medical History:  Diagnosis Date  . Cholecystitis 03/2013  . Complication of anesthesia   . Diabetes mellitus without complication ([place])   . Erectile dysfunction 2010  . HTN (hypertension)   . NSTEMI (non-ST elevated myocardial infarction) ([place]) 03/25/2019  . PONV (postoperative nausea and vomiting)    Past Surgical History  Past Surgical History:  Procedure Laterality Date  . APPENDECTOMY  1969  . CHOLECYSTECTOMY    . CHOLECYSTECTOMY N/A 04/02/2013   Procedure: LAPAROSCOPIC CHOLECYSTECTOMY WITH  INTRAOPERATIVE CHOLANGIOGRAM;  Surgeon: Joyice Faster. Cornett, MD;  Location: [place];  Service: General;  Laterality: N/A;  . CORONARY ARTERY BYPASS GRAFT N/A 05/14/2019   Procedure: CORONARY ARTERY BYPASS GRAFTING (CABG) x3, USING LEFT INTERNAL MAMMARY ARTERY AND LEFT LEG GREATER SAPHENOUS VEIN HARVESTED ENDOSCOPICALLY;  Surgeon: Ivin Poot, MD;  Location: [place];  Service: Open Heart Surgery;  Laterality: N/A;  Inhaled Nitric-Oxide  . ERCP N/A 04/02/2013   Procedure: ENDOSCOPIC RETROGRADE CHOLANGIOPANCREATOGRAPHY (ERCP);  Surgeon: Inda Castle, MD;  Location: [place];  Service: Endoscopy;  Laterality: N/A;  . 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   . IABP INSERTION N/A 05/11/2019   Procedure: IABP INSERTION;  Surgeon: Jolaine Artist, MD;  Location: [place] CV [facility];  Service: Cardiovascular;  Laterality: N/A;  . MITRAL VALVE REPLACEMENT N/A 04/29/2019   Procedure: MITRAL VALVE (MV) REPAIR USING PHYSIO II RING SIZE 26MM;  Surgeon: Prescott Gum, Collier Salina, MD;  Location: [place];  Service: Open Heart Surgery;  Laterality: N/A;  . PLACEMENT OF IMPELLA LEFT VENTRICULAR ASSIST DEVICE Right 05/14/2019   Procedure: Placement of Impella 5.5 Direct;  Surgeon: Ivin Poot, MD;  Location: [place];  Service: Open Heart Surgery;  Laterality: Right;  . RIGHT HEART CATH N/A 05/06/2019   Procedure: RIGHT HEART CATH;  Surgeon: Jolaine Artist, MD;  Location: [place] CV [facility];  Service: Cardiovascular;  Laterality: N/A;  . RIGHT/LEFT HEART CATH AND CORONARY ANGIOGRAPHY N/A 04/03/2019   Procedure: RIGHT/LEFT HEART CATH AND CORONARY ANGIOGRAPHY;  Surgeon: Martinique, Peter M, MD;  Location: [place] [facility];  Service: Cardiovascular;  Laterality: N/A;  . TEE WITHOUT CARDIOVERSION N/A 04/29/2019   Procedure: TRANSESOPHAGEAL ECHOCARDIOGRAM (TEE);  Surgeon: Prescott Gum, Collier Salina, MD;  Location: [place];  Service: Open Heart Surgery;  Laterality: N/A;   Family History  Family History  Problem Relation Age of Onset  . Colon cancer Maternal Aunt   .  Diabetes Maternal Grandmother   . Stroke Maternal Grandmother    Social History  reports that he has never smoked. His smokeless tobacco use includes snuff and chew. He reports previous alcohol use. He reports that he does not use drugs. Allergies  Allergies  Allergen Reactions  . Acetaminophen Itching   Home medications Prior to Admission medications   Medication Sig Start Date End Date Taking? Authorizing Provider  Insulin Glargine (BASAGLAR KWIKPEN)  100 UNIT/ML Inject 28 Units into the skin at bedtime.   Yes [provider]  insulin lispro (HUMALOG) 100 UNIT/ML injection Inject 8 Units into the skin 2 (two) times daily before a meal.   Yes [provider]  metFORMIN (GLUCOPHAGE) 500 MG tablet Take 1 tablet (500 mg total) by mouth 2 (two) times daily with a meal. 06/07/17  Yes Hensel, Jamal Collin, MD  glucose blood (RELION GLUCOSE TEST STRIPS) test strip Use as instructed 02/24/19   [name], MD  Insulin Pen Needle (PEN NEEDLES) 32G X 4 MM MISC     [provider]  ReliOn Ultra Thin Lancets MISC Use to test blood sugar up to 4 times daily. 07/28/13   Coral Spikes, DO     Vitals:   04/26/19 1130 04/26/19 1200 04/26/19 1215 04/26/19 1230  BP:  (!) 104/53    Pulse: 92 92 92 92  Resp: 14 14 14  (!) 21  Temp: (!) 96.4 F (35.8 C) (!) 96.8 F (36 C) (!) 96.8 F (36 C) (!) 97 F (36.1 C)  TempSrc:      SpO2: 99% 94% 94% 95%  Weight:      Height:       Exam Gen on bipap, drowsy but awakens and nods head to quetsions No rash, cyanosis or gangrene R IJ SG in place Sclera anicteric, throat not seen  No jvd or bruits Chest bilat basilar crackles, no wheezing, +chest tubes in place RRR 2/6 sem no gallop Abd soft ntnd no mass or ascites +bs GU normal male  MS no joint effusions or deformity Ext diffuse 1-2+ pitting UE / LE edema, no wounds or ulcers  Neuro is alert, Ox 3 , nf   CXR 4/10 >  IMPRESSION: 1. Stable hazy bilateral airspace process right worse than left which may be due to asymmetric edema versus infecti   Assessment/ Plan: 1. Renal failure - acute. Admit creat 0.99 on 3/29, rising creat and dropping UOP x 3 days now in setting of hypotension/ low perfusion in CAD pt sp CABG w/ low EF.  Get UA, renal US and urine lytes. On pressors. Resp status worsening and on Bipap 60% FiO2. Plan CRRT , see orders. Get vol down.  2. Acute MI/ HFrEF/ severe CAD - sp CABG w/ mitral valve replacement  4/2 3. Vol overload  4. DM2 5. Resp failure - on bipap      Rob Sharonna Vinje  MD 04/26/2019, 1:44 PM  Recent Labs  Lab 04/21/2019 0316 05/09/2019 0336 04/26/19 0315 04/26/19 0329 04/26/19 0816 04/26/19 1122  WBC 16.2*  --  17.0*  --   --   --   HGB 9.4*   < > 9.1*   < > 8.8* 9.2*   < > = values in this interval not displayed.   Recent Labs  Lab 04/19/19 1635 04/20/19 0309 04/26/2019 2230 04/28/2019 2238 04/23/19 0520 04/23/19 3664 04/23/19 1643 04/23/19 1903 04/24/19 0344 04/24/19 4034 04/24/19 1543 04/24/19 1724 05/07/2019 7425 04/23/2019 9563 04/26/19 0315 04/26/19 8756  04/26/19 0816 04/26/19 1122  K  --    < > 4.5   < > 4.4   < > 4.4  4.3   < > 4.2   < > 4.1   < > 3.4*   < > 4.3   < > 4.1 4.0  BUN  --    < > 21   < > 21   < > 22   < > 26*   < > 34*   < > 40*  --  56*  --   --   --   CREATININE  --    < > 1.17   < > 1.27*   < > 1.46*   < > 1.76*   < > 1.88*   < > 2.09*  --  2.84*  --   --   --   CALCIUM  --    < > 7.8*  --  7.8*  --  7.7*  --  7.7*  --  7.6*  --  7.9*  --  7.5*  --   --   --   PHOS 3.5  --   --   --   --   --   --   --   --   --   --   --  4.0  --  5.0*  --   --   --    < > = values in this interval not displayed.

## 2019-04-26 NOTE — Progress Notes (Addendum)
Patient ID: Brendan [name], male   DOB: 03/21/1955, 64 y.o.   MRN: [id number]     Advanced Heart Failure Rounding Note  PCP-Cardiologist: No primary care provider on file.   Subjective:    Events - Presented to [facility] with acute HF  EF 20-25% - Transferred to con - Cath 3/30 with severe 3 vessel disease. EF 25% - Developed PMVT on milrinone -> milrinone stopped -> developed worsening shock -> IABP placed on 4/1 - Clinical deterioration 4/1-> intubated - 4/2 underwent Impella 5.5 placement earlier and bilateral chest tubes with 3L out from each side.  - Extubated 4/4 - 05/05/2019 CABG and MVR - Extubated 4/7 - Back to OR for Impella 5.5 extraction 4/9, extubated.  TEE with EF 35%, RV ok, trivial MR s/p MV repair.   He is on Bipap this morning.     On Epi 2, NE 30, amiodarone 30.  Milrinone stopped this morning.   Minimal UOP overnight, creatinine up to 2.84.  MAP remains stable 65+.  CXR with pulmonary edema.   [place]  CVP 16 PA 30/24 Thermo CI 3.2 Co-ox 60%    Objective:   Weight Range: 78.1 kg Body mass index is 24.01 kg/m.   Vital Signs:   Temp:  [96.3 F (35.7 C)-97.7 F (36.5 C)] 96.8 F (36 C) (04/10 0600) Pulse Rate:  [52-124] 106 (04/10 0630) Resp:  [10-26] 17 (04/10 0630) BP: (96-116)/(44-65) 108/58 (04/10 0500) SpO2:  [83 %-100 %] 94 % (04/10 0630) Arterial Line BP: (82-134)/(31-69) 111/52 (04/10 0630) FiO2 (%):  [60 %-100 %] 60 % (04/10 0316) Weight:  [78.1 kg] 78.1 kg (04/10 0500) Last BM Date: 04/20/19  Weight change: Filed Weights   04/24/19 0500 04/29/2019 0500 04/26/19 0500  Weight: 73.9 kg 73.1 kg 78.1 kg    Intake/Output:   Intake/Output Summary (Last 24 hours) at 04/26/2019 0747 Last data filed at 04/26/2019 0600 Gross per 24 hour  Intake 3649.29 ml  Output 1035 ml  Net 2614.29 ml      Physical Exam   General: NAD Neck: JVP 14+, no thyromegaly or thyroid nodule.  Lungs: Decreased at bases.  CV: Nondisplaced PMI.  Heart regular  S1/S2, no S3/S4, no murmur.  1+ ankle edema.   Abdomen: Soft, nontender, no hepatosplenomegaly, no distention.  Skin: Intact without lesions or rashes.  Neurologic: Alert, follows commands. Psych: Normal affect. Extremities: No clubbing or cyanosis.  HEENT: Normal.    Telemetry   A-paced in 70s. Personally reviewed  Labs    CBC Recent Labs    05/05/2019 0316 05/12/2019 0336 04/26/19 0315 04/26/19 0315 04/26/19 0329 04/26/19 0650  WBC 16.2*  --  17.0*  --   --   --   HGB 9.4*   < > 9.1*   < > 9.2* 9.2*  HCT 27.7*   < > 27.1*   < > 27.0* 27.0*  MCV 85.8  --  85.8  --   --   --   PLT 154  --  196  --   --   --    < > = values in this interval not displayed.   Basic Metabolic Panel Recent Labs    04/23/19 1643 04/23/19 1903 05/06/2019 0316 05/08/2019 0336 04/26/19 0315 04/26/19 0315 04/26/19 0329 04/26/19 0650  NA 135  135   < > 134*   < > 133*   < > 133* 133*  K 4.4  4.3   < > 3.4*   < > 4.3   < >  4.2 4.2  CL 104   < > 99  --  100  --   --   --   CO2 22   < > 22  --  20*  --   --   --   GLUCOSE 119*   < > 78  --  214*  --   --   --   BUN 22   < > 40*  --  56*  --   --   --   CREATININE 1.46*   < > 2.09*  --  2.84*  --   --   --   CALCIUM 7.7*   < > 7.9*  --  7.5*  --   --   --   MG 2.8*  --  2.5*  --   --   --   --   --   PHOS  --   --  4.0  --  5.0*  --   --   --    < > = values in this interval not displayed.   Liver Function Tests Recent Labs    05/16/2019 0316 04/26/19 0315  AST 100* 566*  ALT 66* 194*  ALKPHOS 160* 328*  BILITOT 1.7* 1.2  PROT 5.2* 5.0*  ALBUMIN 2.2* 1.9*   No results for input(s): LIPASE, AMYLASE in the last 72 hours. Cardiac Enzymes No results for input(s): CKTOTAL, CKMB, CKMBINDEX, TROPONINI in the last 72 hours.  BNP: BNP (last 3 results) Recent Labs    04/06/2019 0113  BNP 655.7*    ProBNP (last 3 results) No results for input(s): PROBNP in the last 8760 hours.   D-Dimer No results for input(s): DDIMER in the last 72  hours. Hemoglobin A1C No results for input(s): HGBA1C in the last 72 hours. Fasting Lipid Panel No results for input(s): CHOL, HDL, LDLCALC, TRIG, CHOLHDL, LDLDIRECT in the last 72 hours. Thyroid Function Tests No results for input(s): TSH, T4TOTAL, T3FREE, THYROIDAB in the last 72 hours.  Invalid input(s): FREET3  Other results:   Imaging    ECHO INTRAOPERATIVE TEE  Result Date: 04/26/2019  *INTRAOPERATIVE TRANSESOPHAGEAL REPORT *  Patient Name:   Brendan [name] Date of Exam: 04/23/2019 Medical Rec #:  [id number]        Height:       71.0 in Accession #:    [id number]       Weight:       161.1 lb Date of Birth:  1955-03-23        BSA:          1.92 m Patient Age:    8 years         BP:           112/50 mmHg Patient Gender: M                HR:           90 bpm. Exam Location:  Inpatient Transesophogeal exam was perform intraoperatively during procedure to remove impella and assess cardiac function following impella removal. Patient was closely monitored under general anesthesia during the entirety of examination. Indications:     Removal of impella Performing Phys: Farmer TRIGT Diagnosing Phys: Roberts Gaudy MD PRE-OP FINDINGS  Left Ventricle: Following impella removal, the LV cavity was makedly enlarged and measured 6.1 cm at end-diastole at the mid-papillary level. The ejection fraction was estimated at 35% by visual inspection with akinesis of the inferior wall and normal appearing contractility  of the anterior wall and anterior septum. Right Ventricle: The RV cavity was normal in size. RV systolic function appeared normal.  Pericardium: There is no evidence of pericardial effusion. Mitral Valve: There was an annuloplasty ring in the mitral position. The anterior leaflet was freely mobile and posterior leaflet was restricted in motion. There was no mitral regurgitation seen on color Doppler. The mean trans-mitral gradient was 3 mmhg. Tricuspid Valve: There was trace tricuspid  regurgitation. Aortic Valve: There was an impella cannula through the aortic valve at the beginning of the procedure. Following removal of the impella, the aortic valve leaflets opened normally and there was no aortic insufficiency. +--------------+-------++ LEFT VENTRICLE        +--------------+-------++ PLAX 2D               +--------------+-------++ LVIDd:        6.08 cm +--------------+-------++ LVIDs:        4.99 cm +--------------+-------++ LV SV:        68 ml   +--------------+-------++ LV SV Index:  35.47   +--------------+-------++                       +--------------+-------++ +-------------+---------++ MITRAL VALVE           +-------------+---------++ MV Peak grad:10.1 mmHg +-------------+---------++ MV Mean grad:3.0 mmHg  +-------------+---------++ MV Vmax:     1.59 m/s  +-------------+---------++ MV Vmean:    75.3 cm/s +-------------+---------++ MV VTI:      0.28 m    +-------------+---------++  Roberts Gaudy MD Electronically signed by Roberts Gaudy MD Signature Date/Time: 05/11/2019/5:52:04 PM    Final      Medications:     Scheduled Medications: . acetylcysteine  2 mL Nebulization TID  . aspirin EC  325 mg Oral Daily   Or  . aspirin  324 mg Per Tube Daily  . bisacodyl  10 mg Oral Daily   Or  . bisacodyl  10 mg Rectal Daily  . bisacodyl  10 mg Oral Daily  . chlorhexidine gluconate (MEDLINE KIT)  15 mL Mouth Rinse BID  . Chlorhexidine Gluconate Cloth  6 each Topical Daily  . docusate  200 mg Per Tube Daily  . enoxaparin (LOVENOX) injection  40 mg Subcutaneous Daily  . feeding supplement (ENSURE ENLIVE)  237 mL Oral TID BM  . feeding supplement (PRO-STAT SUGAR FREE 64)  30 mL Per Tube Daily  . gabapentin  300 mg Per Tube Q8H  . insulin aspart  0-24 Units Subcutaneous Q4H  . insulin glargine  8 Units Subcutaneous BID  . mouth rinse  15 mL Mouth Rinse BID  . metoCLOPramide (REGLAN) injection  10 mg Intravenous Q6H  . pantoprazole  sodium  40 mg Per Tube Daily  . pneumococcal 23 valent vaccine  0.5 mL Intramuscular Tomorrow-1000  . rosuvastatin  40 mg Per Tube q1800  . sodium chloride flush  10-40 mL Intracatheter Q12H  . sodium chloride flush  3 mL Intravenous Q12H    Infusions: . sodium chloride 20 mL/hr at 04/26/19 0600  . sodium chloride    . sodium chloride 10 mL/hr at 05/16/2019 1800  . sodium chloride 20 mL/hr at 04/26/2019 1800  . sodium chloride    . amiodarone 30 mg/hr (04/26/19 0630)  . ceFEPime (MAXIPIME) IV Stopped (04/26/19 0551)  . epinephrine 2 mcg/min (04/26/19 0600)  . feeding supplement (VITAL 1.5 CAL) 30 mL/hr at 05/16/2019 1800  . furosemide (LASIX) infusion    . lactated ringers  Stopped (04/29/2019 1640)  . lactated ringers 20 mL/hr at 04/26/19 0600  . norepinephrine (LEVOPHED) Adult infusion 30 mcg/min (04/26/19 0636)    PRN Medications: sodium chloride, Place/Maintain arterial line **AND** sodium chloride, levalbuterol, metoprolol tartrate, morphine injection, ondansetron (ZOFRAN) IV, ondansetron (ZOFRAN) IV, oxyCODONE, sodium chloride flush, sodium chloride flush, traMADol     Assessment/Plan    1.Acute systolic HF ->  Cardiogenic Shock  - Due to iCM. EF 20-25% - Impella 5.5 placed on 4/2.   - s/p CABG/MVRepair on 4/6  - Impella out 4/9.  - CI 3.2, co-ox 60% on norepinephrine 30 + epinephrine 2, milrinone off.  - wean epi and NE as MAP allows.   - Repeat limited echo for EF today.  - Volume overloaded with CVP 16 and pulmonary edema on CXR but ongoing AKI with creatinine up to 2.84 and minimal UOP.  MAP remains stable with good cardiac output.  Will give Lasix 40 mg IV x 1 and Lasix gtt 8 mg/hr and follow UOP/creatinine.    2. CAD - LHC with severe 3 vessel CAD  - s/p CABG x 3 MV Repair 4/6  - on ASA/Crestor  3. Acute hypoxic respiratory failure - Extubated 4/9.  - CXR with pulmonary edema, currently on Bipap.  - Attempt to diurese as above.   4. Mitral Regurgitation -  Mod-severe on ECHO - s/p MVrepair, TEE 4/9 with minimal MR s/p repair.    5. Uncontrolled DM - Hgb A1C 9.  - On insulin and sliding scale.   6. AKI - due to shock - Scr trending back up 1.4>1.7> 2.1>2.84 with minimal UOP overnight.  - MAP and CI are stable.  - Attempting diuresis as above with volume overload.  Will repeat BMET in pm.   7. 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   - Keep  Mag >2   9. 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 No sensation R and L foot and occasionaly in his hands. Requires assistance with ADLs.   10. Severe Malnutrition - Prealbumin 8.9  - Nutrition on board - Continue TFs  11. ID - Empiric cefepime  12. Elevated LFTs - Suspect shock liver, follow.   CRITICAL CARE Performed by: Loralie Champagne  Total critical care time:40 minutes  Critical care time was exclusive of separately billable procedures and treating other patients.  Critical care was necessary to treat or prevent imminent or life-threatening deterioration.  Critical care was time spent personally by me (independent of midlevel providers or residents) on the following activities: development of treatment plan with patient and/or surrogate as well as nursing, discussions with consultants, evaluation of patient's response to treatment, examination of patient, obtaining history from patient or surrogate, ordering and performing treatments and interventions, ordering and review of laboratory studies, ordering and review of radiographic studies, pulse oximetry and re-evaluation of patient's condition.   Length of Stay: 62  Loralie Champagne, MD  04/26/2019, 7:47 AM  Advanced Heart Failure Team Pager [phone] (M-F; 7a - 4p)  Please contact [facility] for night-coverage after hours (4p -7a ) and weekends on [email or web address]

## 2019-04-26 NOTE — Progress Notes (Signed)
1 Day Post-Op Procedure(s) (LRB): REMOVAL OF IMPELLA 5.5 direct, LEFT VENTRICULAR ASSIST DEVICE.with CELL SAVER, TEE (N/A) TRANSESOPHAGEAL ECHOCARDIOGRAM (TEE) (N/A) Bronchial Washings (N/A) Subjective: Somnolent,   Objective: Vital signs in last 24 hours: Temp:  [96.3 F (35.7 C)-97.7 F (36.5 C)] 96.8 F (36 C) (04/10 0600) Pulse Rate:  [52-124] 106 (04/10 0630) Cardiac Rhythm: Ventricular paced (04/09 2000) Resp:  [10-26] 17 (04/10 0630) BP: (96-116)/(44-65) 108/58 (04/10 0500) SpO2:  [83 %-100 %] 94 % (04/10 0630) Arterial Line BP: (82-134)/(31-69) 111/52 (04/10 0630) FiO2 (%):  [60 %-100 %] 60 % (04/10 0316) Weight:  [78.1 kg] 78.1 kg (04/10 0500)  Hemodynamic parameters for last 24 hours: PAP: (26-68)/(10-33) 30/24 CVP:  [6 mmHg-21 mmHg] 19 mmHg CO:  [5.8 L/min-6.6 L/min] 6.6 L/min CI:  [3.1 L/min/m2-3.5 L/min/m2] 3.5 L/min/m2  Intake/Output from previous day: 04/09 0701 - 04/10 0700 In: 3649.3 [I.V.:3367.9; NG/GT:81.5; IV Piggyback:199.9] Out: 1035 [Urine:580; Blood:75; Chest Tube:380] Intake/Output this shift: No intake/output data recorded.  General appearance: appears older than stated age and fatigued Neurologic: intact Heart: regular rate and rhythm, S1, S2 normal, no murmur, click, rub or gallop Lungs: rales bilaterally Abdomen: soft, non-tender; bowel sounds normal; no masses,  no organomegaly Extremities: edema 2+ Wound: dressed, dry  Lab Results: Recent Labs    05/01/2019 0316 05/03/2019 0336 04/26/19 0315 04/26/19 0315 04/26/19 0329 04/26/19 0650  WBC 16.2*  --  17.0*  --   --   --   HGB 9.4*   < > 9.1*   < > 9.2* 9.2*  HCT 27.7*   < > 27.1*   < > 27.0* 27.0*  PLT 154  --  196  --   --   --    < > = values in this interval not displayed.   BMET:  Recent Labs    04/23/2019 0316 05/06/2019 0336 04/26/19 0315 04/26/19 0315 04/26/19 0329 04/26/19 0650  NA 134*   < > 133*   < > 133* 133*  K 3.4*   < > 4.3   < > 4.2 4.2  CL 99  --  100  --   --    --   CO2 22  --  20*  --   --   --   GLUCOSE 78  --  214*  --   --   --   BUN 40*  --  56*  --   --   --   CREATININE 2.09*  --  2.84*  --   --   --   CALCIUM 7.9*  --  7.5*  --   --   --    < > = values in this interval not displayed.    PT/INR: No results for input(s): LABPROT, INR in the last 72 hours. ABG    Component Value Date/Time   PHART 7.372 04/26/2019 0650   HCO3 18.4 (L) 04/26/2019 0650   TCO2 19 (L) 04/26/2019 0650   ACIDBASEDEF 6.0 (H) 04/26/2019 0650   O2SAT 89.0 04/26/2019 0650   CBG (last 3)  Recent Labs    05/14/2019 1955 04/24/2019 2337 04/26/19 0326  GLUCAP 157* 236* 192*    Assessment/Plan: S/P Procedure(s) (LRB): REMOVAL OF IMPELLA 5.5 direct, LEFT VENTRICULAR ASSIST DEVICE.with CELL SAVER, TEE (N/A) TRANSESOPHAGEAL ECHOCARDIOGRAM (TEE) (N/A) Bronchial Washings (N/A) Mobilize Diuresis atrially pace  Restart lasix gtt 2 amp bicarb Repeat labs in pm Echo to assess rt heart (LFTs and sCr bumped)  Hildur Bayer Z. Orvan Seen, [place]   LOS:  12 days    Wonda Olds 04/26/2019

## 2019-04-26 NOTE — Progress Notes (Signed)
Patient has yet to make urine after 40mg  IV push and lasix drip. MD Aundra Dubin made aware.

## 2019-04-26 NOTE — Progress Notes (Signed)
Patient is on NIV at this time tolerating it well, resting comfortably

## 2019-04-26 NOTE — Plan of Care (Signed)

## 2019-04-26 NOTE — Procedures (Signed)
Central Venous Catheter Insertion Procedure Note Brendan [name] [id number] April 26, 1955  Procedure: Insertion of Central Venous Catheter Indications: Hemodialysis  Procedure Details Consent: Risks of procedure as well as the alternatives and risks of each were explained to the (patient/caregiver).  Consent for procedure obtained. Time Out: Verified patient identification, verified procedure, site/side was marked, verified correct patient position, special equipment/implants available, medications/allergies/relevent history reviewed, required imaging and test results available.  Performed  Maximum sterile technique was used including antiseptics, cap, gloves, gown, hand hygiene, mask and sheet. Skin prep: Chlorhexidine; local anesthetic administered A antimicrobial bonded/coated triple lumen catheter was placed in the right femoral vein using the Seldinger technique.  Evaluation Blood flow good Complications: No apparent complications Patient did tolerate procedure well. Chest X-ray ordered to verify placement.  CXR: N/A.  Brendan [name] 04/26/2019, 1:29 PM

## 2019-04-26 NOTE — Progress Notes (Signed)
  Echocardiogram 2D Echocardiogram has been performed.  Brendan [name] 04/26/2019, 8:11 AM

## 2019-04-27 ENCOUNTER — Inpatient Hospital Stay (HOSPITAL_COMMUNITY): Payer: Medicaid Other

## 2019-04-27 DIAGNOSIS — N179 Acute kidney failure, unspecified: Secondary | ICD-10-CM

## 2019-04-27 LAB — CBC
HCT: 29.4 % — ABNORMAL LOW (ref 39.0–52.0)
HCT: 28 % — ABNORMAL LOW (ref 39.0–52.0)
Hemoglobin: 9.9 g/dL — ABNORMAL LOW (ref 13.0–17.0)
Hemoglobin: 9.5 g/dL — ABNORMAL LOW (ref 13.0–17.0)
MCH: 28.7 pg (ref 26.0–34.0)
MCH: 28.8 pg (ref 26.0–34.0)
MCHC: 33.7 g/dL (ref 30.0–36.0)
MCHC: 33.9 g/dL (ref 30.0–36.0)
MCV: 85.2 fL (ref 80.0–100.0)
MCV: 84.8 fL (ref 80.0–100.0)
Platelets: 212 10*3/uL (ref 150–400)
Platelets: 131 10*3/uL — ABNORMAL LOW (ref 150–400)
RBC: 3.45 MIL/uL — ABNORMAL LOW (ref 4.22–5.81)
RBC: 3.3 MIL/uL — ABNORMAL LOW (ref 4.22–5.81)
RDW: 15.4 % (ref 11.5–15.5)
RDW: 15.6 % — ABNORMAL HIGH (ref 11.5–15.5)
WBC: 18.6 10*3/uL — ABNORMAL HIGH (ref 4.0–10.5)
WBC: 16.2 10*3/uL — ABNORMAL HIGH (ref 4.0–10.5)
nRBC: 0.5 % — ABNORMAL HIGH (ref 0.0–0.2)
nRBC: 0.2 % (ref 0.0–0.2)

## 2019-04-27 LAB — GLUCOSE, CAPILLARY
Glucose-Capillary: 114 mg/dL — ABNORMAL HIGH (ref 70–99)
Glucose-Capillary: 128 mg/dL — ABNORMAL HIGH (ref 70–99)
Glucose-Capillary: 139 mg/dL — ABNORMAL HIGH (ref 70–99)
Glucose-Capillary: 175 mg/dL — ABNORMAL HIGH (ref 70–99)
Glucose-Capillary: 80 mg/dL (ref 70–99)
Glucose-Capillary: 81 mg/dL (ref 70–99)
Glucose-Capillary: 94 mg/dL (ref 70–99)

## 2019-04-27 LAB — POCT I-STAT 7, (LYTES, BLD GAS, ICA,H+H)
Acid-base deficit: 6 mmol/L — ABNORMAL HIGH (ref 0.0–2.0)
Acid-base deficit: 2 mmol/L (ref 0.0–2.0)
Acid-base deficit: 1 mmol/L (ref 0.0–2.0)
Bicarbonate: 17.9 mmol/L — ABNORMAL LOW (ref 20.0–28.0)
Bicarbonate: 22.5 mmol/L (ref 20.0–28.0)
Bicarbonate: 23.3 mmol/L (ref 20.0–28.0)
Calcium, Ion: 0.95 mmol/L — ABNORMAL LOW (ref 1.15–1.40)
Calcium, Ion: 1.07 mmol/L — ABNORMAL LOW (ref 1.15–1.40)
Calcium, Ion: 1.09 mmol/L — ABNORMAL LOW (ref 1.15–1.40)
HCT: 26 % — ABNORMAL LOW (ref 39.0–52.0)
HCT: 29 % — ABNORMAL LOW (ref 39.0–52.0)
HCT: 28 % — ABNORMAL LOW (ref 39.0–52.0)
Hemoglobin: 8.8 g/dL — ABNORMAL LOW (ref 13.0–17.0)
Hemoglobin: 9.9 g/dL — ABNORMAL LOW (ref 13.0–17.0)
Hemoglobin: 9.5 g/dL — ABNORMAL LOW (ref 13.0–17.0)
O2 Saturation: 91 %
O2 Saturation: 94 %
O2 Saturation: 86 %
Patient temperature: 36.1
Patient temperature: 36
Patient temperature: 35.6
Potassium: 3.3 mmol/L — ABNORMAL LOW (ref 3.5–5.1)
Potassium: 4.4 mmol/L (ref 3.5–5.1)
Potassium: 4.5 mmol/L (ref 3.5–5.1)
Sodium: 141 mmol/L (ref 135–145)
Sodium: 136 mmol/L (ref 135–145)
Sodium: 136 mmol/L (ref 135–145)
TCO2: 19 mmol/L — ABNORMAL LOW (ref 22–32)
TCO2: 24 mmol/L (ref 22–32)
TCO2: 24 mmol/L (ref 22–32)
pCO2 arterial: 27.3 mmHg — ABNORMAL LOW (ref 32.0–48.0)
pCO2 arterial: 34 mmHg (ref 32.0–48.0)
pCO2 arterial: 35.8 mmHg (ref 32.0–48.0)
pH, Arterial: 7.42 (ref 7.350–7.450)
pH, Arterial: 7.426 (ref 7.350–7.450)
pH, Arterial: 7.416 (ref 7.350–7.450)
pO2, Arterial: 55 mmHg — ABNORMAL LOW (ref 83.0–108.0)
pO2, Arterial: 66 mmHg — ABNORMAL LOW (ref 83.0–108.0)
pO2, Arterial: 47 mmHg — ABNORMAL LOW (ref 83.0–108.0)

## 2019-04-27 LAB — HEPATIC FUNCTION PANEL
ALT: 195 U/L — ABNORMAL HIGH (ref 0–44)
AST: 295 U/L — ABNORMAL HIGH (ref 15–41)
Albumin: 1.7 g/dL — ABNORMAL LOW (ref 3.5–5.0)
Alkaline Phosphatase: 405 U/L — ABNORMAL HIGH (ref 38–126)
Bilirubin, Direct: 0.4 mg/dL — ABNORMAL HIGH (ref 0.0–0.2)
Indirect Bilirubin: 0.6 mg/dL (ref 0.3–0.9)
Total Bilirubin: 1 mg/dL (ref 0.3–1.2)
Total Protein: 5 g/dL — ABNORMAL LOW (ref 6.5–8.1)

## 2019-04-27 LAB — RENAL FUNCTION PANEL
Albumin: 1.8 g/dL — ABNORMAL LOW (ref 3.5–5.0)
Albumin: 1.7 g/dL — ABNORMAL LOW (ref 3.5–5.0)
Anion gap: 12 (ref 5–15)
Anion gap: 10 (ref 5–15)
BUN: 38 mg/dL — ABNORMAL HIGH (ref 8–23)
BUN: 34 mg/dL — ABNORMAL HIGH (ref 8–23)
CO2: 21 mmol/L — ABNORMAL LOW (ref 22–32)
CO2: 23 mmol/L (ref 22–32)
Calcium: 7.5 mg/dL — ABNORMAL LOW (ref 8.9–10.3)
Calcium: 7.1 mg/dL — ABNORMAL LOW (ref 8.9–10.3)
Chloride: 101 mmol/L (ref 98–111)
Chloride: 104 mmol/L (ref 98–111)
Creatinine, Ser: 2.2 mg/dL — ABNORMAL HIGH (ref 0.61–1.24)
Creatinine, Ser: 1.94 mg/dL — ABNORMAL HIGH (ref 0.61–1.24)
GFR calc Af Amer: 36 mL/min — ABNORMAL LOW (ref >60)
GFR calc Af Amer: 42 mL/min — ABNORMAL LOW (ref >60)
GFR calc non Af Amer: 31 mL/min — ABNORMAL LOW (ref >60)
GFR calc non Af Amer: 36 mL/min — ABNORMAL LOW (ref >60)
Glucose, Bld: 129 mg/dL — ABNORMAL HIGH (ref 70–99)
Glucose, Bld: 181 mg/dL — ABNORMAL HIGH (ref 70–99)
Phosphorus: 3 mg/dL (ref 2.5–4.6)
Phosphorus: 2.5 mg/dL (ref 2.5–4.6)
Potassium: 4.4 mmol/L (ref 3.5–5.1)
Potassium: 4.7 mmol/L (ref 3.5–5.1)
Sodium: 134 mmol/L — ABNORMAL LOW (ref 135–145)
Sodium: 137 mmol/L (ref 135–145)

## 2019-04-27 LAB — COOXEMETRY PANEL
Carboxyhemoglobin: 1.1 % (ref 0.5–1.5)
Methemoglobin: 1.3 % (ref 0.0–1.5)
O2 Saturation: 52.7 %
Total hemoglobin: 9.3 g/dL — ABNORMAL LOW (ref 12.0–16.0)

## 2019-04-27 LAB — URINE CULTURE
Culture: >=100000 — AB
Special Requests: NORMAL

## 2019-04-27 LAB — MAGNESIUM: Magnesium: 2.4 mg/dL (ref 1.7–2.4)

## 2019-04-27 LAB — PROCALCITONIN: Procalcitonin: 6.93 ng/mL

## 2019-04-27 LAB — APTT: aPTT: 41 seconds — ABNORMAL HIGH (ref 24–36)

## 2019-04-27 MED ORDER — BUPRENORPHINE HCL-NALOXONE HCL 8-2 MG SL SUBL: 1.0000 | SUBLINGUAL_TABLET | Freq: Two times a day (BID) | SUBLINGUAL | Status: DC

## 2019-04-27 MED ORDER — BUPRENORPHINE HCL-NALOXONE HCL 2-0.5 MG SL SUBL
2.0000 | SUBLINGUAL_TABLET | Freq: Two times a day (BID) | SUBLINGUAL | Status: DC
  Administered 2019-04-27: 2 via SUBLINGUAL
  Filled 2019-04-27: qty 2

## 2019-04-27 MED ORDER — DOBUTAMINE IN D5W 4-5 MG/ML-% IV SOLN
2.5000 ug/kg/min | INTRAVENOUS | Status: DC
  Administered 2019-04-27: 2.5 ug/kg/min via INTRAVENOUS
  Filled 2019-04-27: qty 250

## 2019-04-27 MED ORDER — SODIUM CHLORIDE 0.9 % IV SOLN
2.0000 g | Freq: Three times a day (TID) | INTRAVENOUS | Status: DC
  Administered 2019-04-28: 2 g via INTRAVENOUS
  Filled 2019-04-27 (×2): qty 2000

## 2019-04-27 NOTE — Progress Notes (Signed)
Pt remains on BIPAP V60 at this time. Pt able to maintain a sat of 93% currently while on 85% FIO2. Pt settings 12/8 BUR 12. RT will continue to monitor.

## 2019-04-27 NOTE — Progress Notes (Signed)
2 Days Post-Op Procedure(s) (LRB): REMOVAL OF IMPELLA 5.5 direct, LEFT VENTRICULAR ASSIST DEVICE.with CELL SAVER, TEE (N/A) TRANSESOPHAGEAL ECHOCARDIOGRAM (TEE) (N/A) Bronchial Washings (N/A) Subjective: States he's tired  Objective: Vital signs in last 24 hours: Temp:  [95.5 F (35.3 C)-98 F (36.7 C)] 96.8 F (36 C) (04/11 0700) Pulse Rate:  [79-120] 92 (04/11 0700) Cardiac Rhythm: Atrial paced (04/10 2000) Resp:  [11-25] 17 (04/11 0700) BP: (92-166)/(42-89) 104/66 (04/11 0700) SpO2:  [80 %-100 %] 91 % (04/11 0700) Arterial Line BP: (92-131)/(41-60) 106/44 (04/11 0700) FiO2 (%):  [80 %-100 %] 100 % (04/11 0428) Weight:  [77 kg] 77 kg (04/11 0500)  Hemodynamic parameters for last 24 hours: PAP: (22-43)/(12-33) 27/13 CVP:  [9 mmHg-20 mmHg] 11 mmHg CO:  [5.5 L/min-7.3 L/min] 5.5 L/min CI:  [2.9 L/min/m2-3.9 L/min/m2] 3 L/min/m2  Intake/Output from previous day: 04/10 0701 - 04/11 0700 In: 2810.6 [I.V.:1830.5; NG/GT:780; IV Piggyback:200.1] Out: 4543 [Urine:75; Chest Tube:950] Intake/Output this shift: No intake/output data recorded.  General appearance: cooperative, fatigued and no distress Neurologic: grossly intact Heart: regular rate and rhythm, S1, S2 normal, no murmur, click, rub or gallop Lungs: rales bilaterally Abdomen: soft, non-tender; bowel sounds normal; no masses,  no organomegaly Extremities: edema 2+ Wound: dressed, dry  Lab Results: Recent Labs    04/26/19 0315 04/26/19 0329 04/27/19 0425 04/27/19 0437  WBC 17.0*  --  18.6*  --   HGB 9.1*   < > 9.9* 8.8*  HCT 27.1*   < > 29.4* 26.0*  PLT 196  --  212  --    < > = values in this interval not displayed.   BMET:  Recent Labs    04/26/19 1530 04/26/19 1530 04/27/19 0429 04/27/19 0437  NA 135   < > 134* 141  K 4.2   < > 4.4 3.3*  CL 101  --  101  --   CO2 21*  --  21*  --   GLUCOSE 140*  --  129*  --   BUN 59*  --  38*  --   CREATININE 3.01*  --  2.20*  --   CALCIUM 7.4*  --  7.5*  --     < > = values in this interval not displayed.    PT/INR: No results for input(s): LABPROT, INR in the last 72 hours. ABG    Component Value Date/Time   PHART 7.420 04/27/2019 0437   HCO3 17.9 (L) 04/27/2019 0437   TCO2 19 (L) 04/27/2019 0437   ACIDBASEDEF 6.0 (H) 04/27/2019 0437   O2SAT 52.7 04/27/2019 0444   CBG (last 3)  Recent Labs    04/27/19 0004 04/27/19 0435 04/27/19 0734  GLUCAP 128* 114* 94    Assessment/Plan: S/P Procedure(s) (LRB): REMOVAL OF IMPELLA 5.5 direct, LEFT VENTRICULAR ASSIST DEVICE.with CELL SAVER, TEE (N/A) TRANSESOPHAGEAL ECHOCARDIOGRAM (TEE) (N/A) Bronchial Washings (N/A) Diuresis will need to consider reintubation if he continues to tire  Appreciate renal input    LOS: 13 days    Wonda Olds 04/27/2019

## 2019-04-27 NOTE — Progress Notes (Addendum)
Patient ID: Brendan [name], male   DOB: 1955-12-01, 64 y.o.   MRN: [id number]     Advanced Heart Failure Rounding Note  PCP-Cardiologist: No primary care provider on file.   Subjective:    Events - Presented to [facility] with acute HF  EF 20-25% - Transferred to con - Cath 3/30 with severe 3 vessel disease. EF 25% - Developed PMVT on milrinone -> milrinone stopped -> developed worsening shock -> IABP placed on 4/1 - Clinical deterioration 4/1-> intubated - 4/2 underwent Impella 5.5 placement earlier and bilateral chest tubes with 3L out from each side.  - Extubated 4/4 - 05/12/2019 CABG and MVR - Extubated 4/7 - Back to OR for Impella 5.5 extraction 4/9, extubated.  TEE with EF 35%, RV ok, trivial MR s/p MV repair.  - CVVH started 4/10 with oliguria, rising creatinine and CVP.   Short of breath, Bipap continues.  CXR with extensive bilateral airspace disease.    On NE 38, amiodarone 30.   Currently pulling net UF 50 cc/hr via CVVH, pulled 100-150 overnight but limited by pressure. Minimal UOP.   Afebrile, WBCs 18.6.  Empirically covering with cefepime for PNA.   Echo (4/10): EF 25-30%, normal RV, no significant MR.  I do not think there is an apical thrombus (think trabeculation).   [place]  CVP 18-20 PA 30/21 Thermo CI 3 Co-ox 53%   Objective:   Weight Range: 77 kg Body mass index is 23.68 kg/m.   Vital Signs:   Temp:  [95.5 F (35.3 C)-98 F (36.7 C)] 96.8 F (36 C) (04/11 0700) Pulse Rate:  [79-120] 92 (04/11 0700) Resp:  [11-25] 17 (04/11 0700) BP: (92-166)/(42-89) 104/66 (04/11 0700) SpO2:  [80 %-100 %] 91 % (04/11 0700) Arterial Line BP: (92-131)/(41-60) 106/44 (04/11 0700) FiO2 (%):  [80 %-100 %] 100 % (04/11 0428) Weight:  [77 kg] 77 kg (04/11 0500) Last BM Date: 04/20/19  Weight change: Filed Weights   04/30/2019 0500 04/26/19 0500 04/27/19 0500  Weight: 73.1 kg 78.1 kg 77 kg    Intake/Output:   Intake/Output Summary (Last 24 hours) at 04/27/2019  0748 Last data filed at 04/27/2019 0700 Gross per 24 hour  Intake 2810.55 ml  Output 4543 ml  Net -1732.45 ml      Physical Exam   General: Bipap, dyspneic Neck: JVP 14+ cm, no thyromegaly or thyroid nodule.  Lungs: Crackles bases CV: Nondisplaced PMI.  Heart regular S1/S2, no S3/S4, no murmur.  1+ edema to knees.   Abdomen: Soft, nontender, no hepatosplenomegaly, no distention.  Skin: Intact without lesions or rashes.  Neurologic: Alert and oriented x 3.  Psych: Anxious Extremities: No clubbing or cyanosis.  HEENT: Normal.    Telemetry   A-paced in 70s. Personally reviewed  Labs    CBC Recent Labs    04/26/19 0315 04/26/19 0329 04/27/19 0425 04/27/19 0437  WBC 17.0*  --  18.6*  --   HGB 9.1*   < > 9.9* 8.8*  HCT 27.1*   < > 29.4* 26.0*  MCV 85.8  --  85.2  --   PLT 196  --  212  --    < > = values in this interval not displayed.   Basic Metabolic Panel Recent Labs    04/26/2019 0316 05/03/2019 0336 04/26/19 1530 04/26/19 1530 04/27/19 0425 04/27/19 0429 04/27/19 0437  NA 134*   < > 135   < >  --  134* 141  K 3.4*   < > 4.2   < >  --  4.4 3.3*  CL 99   < > 101  --   --  101  --   CO2 22   < > 21*  --   --  21*  --   GLUCOSE 78   < > 140*  --   --  129*  --   BUN 40*   < > 59*  --   --  38*  --   CREATININE 2.09*   < > 3.01*  --   --  2.20*  --   CALCIUM 7.9*   < > 7.4*  --   --  7.5*  --   MG 2.5*  --   --   --  2.4  --   --   PHOS 4.0   < > 4.2  --   --  3.0  --    < > = values in this interval not displayed.   Liver Function Tests Recent Labs    04/26/19 0315 04/26/19 1530 04/27/19 0425 04/27/19 0429  AST 566*  --  295*  --   ALT 194*  --  195*  --   ALKPHOS 328*  --  405*  --   BILITOT 1.2  --  1.0  --   PROT 5.0*  --  5.0*  --   ALBUMIN 1.9*   < > 1.7* 1.8*   < > = values in this interval not displayed.   No results for input(s): LIPASE, AMYLASE in the last 72 hours. Cardiac Enzymes No results for input(s): CKTOTAL, CKMB, CKMBINDEX,  TROPONINI in the last 72 hours.  BNP: BNP (last 3 results) Recent Labs    03/25/2019 0113  BNP 655.7*    ProBNP (last 3 results) No results for input(s): PROBNP in the last 8760 hours.   D-Dimer No results for input(s): DDIMER in the last 72 hours. Hemoglobin A1C No results for input(s): HGBA1C in the last 72 hours. Fasting Lipid Panel No results for input(s): CHOL, HDL, LDLCALC, TRIG, CHOLHDL, LDLDIRECT in the last 72 hours. Thyroid Function Tests No results for input(s): TSH, T4TOTAL, T3FREE, THYROIDAB in the last 72 hours.  Invalid input(s): FREET3  Other results:   Imaging    ECHOCARDIOGRAM LIMITED  Result Date: 04/26/2019    ECHOCARDIOGRAM LIMITED REPORT   Patient Name:   Brendan [name] Date of Exam: 04/26/2019 Medical Rec #:  [id number]        Height:       71.0 in Accession #:    [id number]       Weight:       172.2 lb Date of Birth:  [birth year]        BSA:          1.979 m Patient Age:    82 years         BP:           115/59 mmHg Patient Gender: M                HR:           111 bpm. Exam Location:  Inpatient Procedure: Limited Echo Indications:    CHF  History:        Patient has prior history of Echocardiogram examinations, most                 recent 04/29/2019. CHF, CAD, Prior CABG and Impella, MV repair;  Risk Factors:Diabetes.  Sonographer:    Dustin Flock Referring Phys: [id number] Z ATKINS  Sonographer Comments: S/p CABG. IMPRESSIONS  1. Very poor image quality limits accurate assessment of wall motion. There appears to be akinesis of the inferior and inferolateral walls. Possible false tendon in the LV apex but cannot rule out LV thrombus. Left ventricular ejection fraction, by estimation, is 25 to 30%. The left ventricle has severely decreased function. The left ventricle has no regional wall motion abnormalities.  2. Right ventricular systolic function is normal. The right ventricular size is normal.  3. The mitral valve is normal in  structure. No evidence of mitral valve regurgitation. No evidence of mitral stenosis.  4. The aortic valve was not well visualized. Aortic valve regurgitation is not visualized. Mild aortic valve sclerosis is present, with no evidence of aortic valve stenosis.  5. The inferior vena cava is normal in size with greater than 50% respiratory variability, suggesting right atrial pressure of 3 mmHg.  6. Recommend repeat limited echo with definity contrast FINDINGS  Left Ventricle: Very poor image quality limits accurate assessment of wall motion. There appears to be akinesis of the inferior and inferolateral walls. Possible false tendon in the LV apex but cannot rule out LV thrombus. Left ventricular ejection fraction, by estimation, is 25 to 30%. The left ventricle has severely decreased function. The left ventricle has no regional wall motion abnormalities. The left ventricular internal cavity size was normal in size. There is mild  concentric left ventricular hypertrophy. Right Ventricle: The right ventricular size is normal. No increase in right ventricular wall thickness. Right ventricular systolic function is normal. Left Atrium: Left atrial size was normal in size. Right Atrium: Right atrial size was normal in size. Pericardium: There is no evidence of pericardial effusion. Mitral Valve: The mitral valve is normal in structure. Normal mobility of the mitral valve leaflets. No evidence of mitral valve stenosis. Tricuspid Valve: The tricuspid valve is normal in structure. Tricuspid valve regurgitation is not demonstrated. No evidence of tricuspid stenosis. Aortic Valve: The aortic valve was not well visualized. Aortic valve regurgitation is not visualized. Mild aortic valve sclerosis is present, with no evidence of aortic valve stenosis. Pulmonic Valve: The pulmonic valve was normal in structure. Pulmonic valve regurgitation is not visualized. No evidence of pulmonic stenosis. Aorta: The aortic root is normal in size  and structure. Venous: The inferior vena cava is normal in size with greater than 50% respiratory variability, suggesting right atrial pressure of 3 mmHg. IAS/Shunts: No atrial level shunt detected by color flow Doppler.  LEFT VENTRICLE PLAX 2D LVIDd:         5.17 cm LVIDs:         4.64 cm LV PW:         1.18 cm LV IVS:        1.24 cm  Fransico Him MD Electronically signed by Fransico Him MD Signature Date/Time: 04/26/2019/2:33:13 PM    Final      Medications:     Scheduled Medications: . acetylcysteine  2 mL Nebulization TID  . aspirin EC  325 mg Oral Daily   Or  . aspirin  324 mg Per Tube Daily  . bisacodyl  10 mg Oral Daily   Or  . bisacodyl  10 mg Rectal Daily  . bisacodyl  10 mg Oral Daily  . chlorhexidine gluconate (MEDLINE KIT)  15 mL Mouth Rinse BID  . Chlorhexidine Gluconate Cloth  6 each Topical Daily  . docusate  200 mg Per Tube Daily  . feeding supplement (ENSURE ENLIVE)  237 mL Oral TID BM  . feeding supplement (PRO-STAT SUGAR FREE 64)  30 mL Per Tube Daily  . gabapentin  300 mg Per Tube Q8H  . insulin aspart  0-24 Units Subcutaneous Q4H  . insulin glargine  8 Units Subcutaneous BID  . mouth rinse  15 mL Mouth Rinse BID  . metoCLOPramide (REGLAN) injection  10 mg Intravenous Q6H  . pantoprazole sodium  40 mg Per Tube Daily  . pneumococcal 23 valent vaccine  0.5 mL Intramuscular Tomorrow-1000  . rosuvastatin  10 mg Per Tube q1800  . sodium chloride flush  10-40 mL Intracatheter Q12H  . sodium chloride flush  3 mL Intravenous Q12H    Infusions: .  prismasol BGK 4/2.5 400 mL/hr at 04/27/19 0314  .  prismasol BGK 4/2.5 200 mL/hr at 04/26/19 1411  . sodium chloride 10 mL/hr at 04/27/19 0700  . sodium chloride    . sodium chloride 10 mL/hr at 04/24/2019 1800  . sodium chloride 20 mL/hr at 05/09/2019 1800  . sodium chloride    . amiodarone 30 mg/hr (04/27/19 0700)  . ceFEPime (MAXIPIME) IV Stopped (04/27/19 0612)  . DOBUTamine    . epinephrine Stopped (04/26/19 0855)  .  feeding supplement (VITAL 1.5 CAL) 1,000 mL (04/26/19 1713)  . heparin 10,000 units/ 20 mL infusion syringe    . lactated ringers Stopped (05/07/2019 1640)  . lactated ringers 10 mL/hr at 04/27/19 0700  . norepinephrine (LEVOPHED) Adult infusion 38 mcg/min (04/27/19 0700)  . prismasol BGK 4/2.5 1,800 mL/hr at 04/27/19 0452    PRN Medications: sodium chloride, Place/Maintain arterial line **AND** sodium chloride, alteplase, heparin, levalbuterol, metoprolol tartrate, morphine injection, ondansetron (ZOFRAN) IV, ondansetron (ZOFRAN) IV, oxyCODONE, sodium chloride, sodium chloride flush, sodium chloride flush, traMADol     Assessment/Plan    1.Acute systolic HF ->  Cardiogenic Shock  - Due to iCM. EF 20-25% - Impella 5.5 placed on 4/2.   - s/p CABG/MVRepair on 4/6  - Impella out 4/9.  - Echo 4/10 with EF 25-30%, normal RV, no MR.   - Now on CVVH with volume overload/progressive renal failure.  - CI 3 from [place] but co-ox 53% on norepinephrine 38.  - Volume overloaded with CVP 18-20 and extensive pulmonary edema on CXR, more short of breath and on Bipap.  Need significant fluid removal to keep him from getting intubated again. Currently net UF 50 cc/hr by CVVH, will increase to 100-150 cc/hr.  Will start dobutamine 2.5 with low co-ox, and if need further BP augmentation to allow adequate UF by CVVH can use epinephrine vs vasopressin.   2. CAD - LHC with severe 3 vessel CAD  - s/p CABG x 3 MV Repair 4/6  - on ASA/Crestor  3. Acute hypoxic respiratory failure - Extubated 4/9.  - CXR with extensive pulmonary edema and ?PNA component, currently on Bipap.  - Attempt to diurese as above. Worried that if we cannot get significant fluid off him today, he may need to be re-intubated.   4. Mitral Regurgitation - Mod-severe on ECHO - s/p MVrepair, TEE 4/9 with minimal MR s/p repair.   - Echo 4/10 with no significant MR.   5. Uncontrolled DM - Hgb A1C 9.  - On insulin and sliding scale.    6. AKI - due to shock - Became oliguric and now on CVVH to control volume overload.  Renal following.   7. Acute blood loss Anemia  -  transfuse as need to keep Hgb > 7.5. Stable.   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   - Keep  Mag >2   9. 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 No sensation R and L foot and occasionaly in his hands. Requires assistance with ADLs.   10. Severe Malnutrition - Prealbumin 8.9  - Nutrition on board - Continue TFs  11. ID - Empiric cefepime for possible PNA.  - PCT today.   12. Elevated LFTs - Suspect shock liver, trending down.   CRITICAL CARE Performed by: Loralie Champagne  Total critical care time:40 minutes  Critical care time was exclusive of separately billable procedures and treating other patients.  Critical care was necessary to treat or prevent imminent or life-threatening deterioration.  Critical care was time spent personally by me (independent of midlevel providers or residents) on the following activities: development of treatment plan with patient and/or surrogate as well as nursing, discussions with consultants, evaluation of patient's response to treatment, examination of patient, obtaining history from patient or surrogate, ordering and performing treatments and interventions, ordering and review of laboratory studies, ordering and review of radiographic studies, pulse oximetry and re-evaluation of patient's condition.   Length of Stay: 39  Loralie Champagne, MD  04/27/2019, 7:48 AM  Advanced Heart Failure Team Pager [phone] (M-F; 7a - 4p)  Please contact [facility] for night-coverage after hours (4p -7a ) and weekends on [email or web address]

## 2019-04-27 NOTE — Progress Notes (Signed)
[facility] Progress Note  Subjective: net neg  1.7 L yesterday.  per RN tolerated 100- 200 cc/hr UF yest during the day w/o increasing pressors, this am however UF is down to 50 and pressors ^'d. Seen by CHF and starting dobutamine. CXR no better today  Vitals:   04/27/19 0730 04/27/19 0745 04/27/19 0800 04/27/19 0808  BP:   125/68 (!) 101/40  Pulse: 93 92 92 95  Resp: 17 17 16 20   Temp: (!) 96.8 F (36 C) (!) 96.8 F (36 C) (!) 96.8 F (36 C)   TempSrc:      SpO2: 93% 95% 95% 94%  Weight:      Height:        Exam: Gen on bipap, awake No jvd or bruits Chest bilat basilar crackles RRR 2/6 sem no gallop Abd soft ntnd no mass or ascites +bs Ext diffuse 1-2+ pitting UE / LE edema Neuro is alert, Ox 3 , nf    UA 4/10 -  20-50 rbc/ wbc, 100 prot   LUNa 32, UCr 90   Assessment/ Plan: 1. Renal failure - acute. Admit creat 0.99 on 3/29, now AKI, dec'd UOP in setting of hypotension/ low perfusion in CAD pt sp CABG w/ low EF.  Resp failure on bipap, severe edema on CXR.  Cont CRRT, UF 100- 200 cc /hr today. Dobutamine added this am. Will ^heparin 750u into circuit flat rate for clotting. PTT up a little 43 today.  2. Acute MI/ HFrEF/ severe CAD - sp CABG w/ mitral valve replacement 4/2 3. Vol overload - persists 4. DM2 5. Resp failure - on bipap     Brendan [name] 04/27/2019, 10:10 AM   Recent Labs  Lab 04/26/19 1530 04/27/19 0425 04/27/19 0429 04/27/19 0437 04/27/19 0908  K 4.2   < > 4.4 3.3* 4.4  BUN 59*  --  38*  --   --   CREATININE 3.01*  --  2.20*  --   --   CALCIUM 7.4*  --  7.5*  --   --   PHOS 4.2  --  3.0  --   --   HGB  --    < >  --  8.8* 9.9*   < > = values in this interval not displayed.   Inpatient medications: . acetylcysteine  2 mL Nebulization TID  . aspirin EC  325 mg Oral Daily   Or  . aspirin  324 mg Per Tube Daily  . bisacodyl  10 mg Oral Daily   Or  . bisacodyl  10 mg Rectal Daily  . bisacodyl  10 mg Oral Daily  .  chlorhexidine gluconate (MEDLINE KIT)  15 mL Mouth Rinse BID  . Chlorhexidine Gluconate Cloth  6 each Topical Daily  . docusate  200 mg Per Tube Daily  . feeding supplement (ENSURE ENLIVE)  237 mL Oral TID BM  . feeding supplement (PRO-STAT SUGAR FREE 64)  30 mL Per Tube Daily  . gabapentin  300 mg Per Tube Q8H  . insulin aspart  0-24 Units Subcutaneous Q4H  . insulin glargine  8 Units Subcutaneous BID  . mouth rinse  15 mL Mouth Rinse BID  . metoCLOPramide (REGLAN) injection  10 mg Intravenous Q6H  . pantoprazole sodium  40 mg Per Tube Daily  . pneumococcal 23 valent vaccine  0.5 mL Intramuscular Tomorrow-1000  . rosuvastatin  10 mg Per Tube q1800  . sodium chloride flush  10-40 mL Intracatheter Q12H  . sodium  chloride flush  3 mL Intravenous Q12H   .  prismasol BGK 4/2.5 400 mL/hr at 04/27/19 0314  .  prismasol BGK 4/2.5 200 mL/hr at 04/26/19 1411  . sodium chloride 10 mL/hr at 04/27/19 0800  . sodium chloride    . sodium chloride 10 mL/hr at 04/29/2019 1800  . sodium chloride 20 mL/hr at 04/23/2019 1800  . sodium chloride    . amiodarone 30 mg/hr (04/27/19 0800)  . ceFEPime (MAXIPIME) IV Stopped (04/27/19 0612)  . DOBUTamine 2.5 mcg/kg/min (04/27/19 0805)  . epinephrine 10 mcg/min (04/27/19 0843)  . feeding supplement (VITAL 1.5 CAL) 1,000 mL (04/26/19 1713)  . heparin 10,000 units/ 20 mL infusion syringe 500 Units/hr (04/27/19 0806)  . lactated ringers Stopped (05/10/2019 1640)  . lactated ringers 10 mL/hr at 04/27/19 0800  . norepinephrine (LEVOPHED) Adult infusion 38 mcg/min (04/27/19 0800)  . prismasol BGK 4/2.5 1,800 mL/hr at 04/27/19 0749   sodium chloride, Place/Maintain arterial line **AND** sodium chloride, alteplase, heparin, levalbuterol, metoprolol tartrate, morphine injection, ondansetron (ZOFRAN) IV, ondansetron (ZOFRAN) IV, oxyCODONE, sodium chloride, sodium chloride flush, sodium chloride flush, traMADol

## 2019-04-28 ENCOUNTER — Inpatient Hospital Stay (HOSPITAL_COMMUNITY): Payer: Medicaid Other

## 2019-04-28 ENCOUNTER — Encounter: Payer: Self-pay | Admitting: *Deleted

## 2019-04-28 DIAGNOSIS — R739 Hyperglycemia, unspecified: Secondary | ICD-10-CM

## 2019-04-28 DIAGNOSIS — D649 Anemia, unspecified: Secondary | ICD-10-CM

## 2019-04-28 DIAGNOSIS — R579 Shock, unspecified: Secondary | ICD-10-CM

## 2019-04-28 DIAGNOSIS — J9611 Chronic respiratory failure with hypoxia: Secondary | ICD-10-CM

## 2019-04-28 LAB — BPAM RBC
Blood Product Expiration Date: 202105132359
Blood Product Expiration Date: 202105142359
Unit Type and Rh: 9500
Unit Type and Rh: 9500

## 2019-04-28 LAB — RENAL FUNCTION PANEL
Albumin: 1.6 g/dL — ABNORMAL LOW (ref 3.5–5.0)
Albumin: 1.5 g/dL — ABNORMAL LOW (ref 3.5–5.0)
Anion gap: 14 (ref 5–15)
Anion gap: 10 (ref 5–15)
BUN: 27 mg/dL — ABNORMAL HIGH (ref 8–23)
BUN: 27 mg/dL — ABNORMAL HIGH (ref 8–23)
CO2: 21 mmol/L — ABNORMAL LOW (ref 22–32)
CO2: 24 mmol/L (ref 22–32)
Calcium: 7.2 mg/dL — ABNORMAL LOW (ref 8.9–10.3)
Calcium: 7 mg/dL — ABNORMAL LOW (ref 8.9–10.3)
Chloride: 102 mmol/L (ref 98–111)
Chloride: 103 mmol/L (ref 98–111)
Creatinine, Ser: 1.84 mg/dL — ABNORMAL HIGH (ref 0.61–1.24)
Creatinine, Ser: 1.65 mg/dL — ABNORMAL HIGH (ref 0.61–1.24)
GFR calc Af Amer: 45 mL/min — ABNORMAL LOW (ref >60)
GFR calc Af Amer: 51 mL/min — ABNORMAL LOW (ref >60)
GFR calc non Af Amer: 39 mL/min — ABNORMAL LOW (ref >60)
GFR calc non Af Amer: 44 mL/min — ABNORMAL LOW (ref >60)
Glucose, Bld: 161 mg/dL — ABNORMAL HIGH (ref 70–99)
Glucose, Bld: 227 mg/dL — ABNORMAL HIGH (ref 70–99)
Phosphorus: 3.4 mg/dL (ref 2.5–4.6)
Phosphorus: 2.3 mg/dL — ABNORMAL LOW (ref 2.5–4.6)
Potassium: 4.9 mmol/L (ref 3.5–5.1)
Potassium: 4.9 mmol/L (ref 3.5–5.1)
Sodium: 137 mmol/L (ref 135–145)
Sodium: 137 mmol/L (ref 135–145)

## 2019-04-28 LAB — POCT I-STAT 7, (LYTES, BLD GAS, ICA,H+H)
Acid-base deficit: 1 mmol/L (ref 0.0–2.0)
Acid-base deficit: 3 mmol/L — ABNORMAL HIGH (ref 0.0–2.0)
Acid-base deficit: 3 mmol/L — ABNORMAL HIGH (ref 0.0–2.0)
Bicarbonate: 21.9 mmol/L (ref 20.0–28.0)
Bicarbonate: 22.8 mmol/L (ref 20.0–28.0)
Bicarbonate: 25 mmol/L (ref 20.0–28.0)
Bicarbonate: 25.2 mmol/L (ref 20.0–28.0)
Calcium, Ion: 1.03 mmol/L — ABNORMAL LOW (ref 1.15–1.40)
Calcium, Ion: 1.06 mmol/L — ABNORMAL LOW (ref 1.15–1.40)
Calcium, Ion: 1.07 mmol/L — ABNORMAL LOW (ref 1.15–1.40)
Calcium, Ion: 1.09 mmol/L — ABNORMAL LOW (ref 1.15–1.40)
HCT: 28 % — ABNORMAL LOW (ref 39.0–52.0)
HCT: 28 % — ABNORMAL LOW (ref 39.0–52.0)
HCT: 29 % — ABNORMAL LOW (ref 39.0–52.0)
HCT: 30 % — ABNORMAL LOW (ref 39.0–52.0)
Hemoglobin: 10.2 g/dL — ABNORMAL LOW (ref 13.0–17.0)
Hemoglobin: 9.5 g/dL — ABNORMAL LOW (ref 13.0–17.0)
Hemoglobin: 9.5 g/dL — ABNORMAL LOW (ref 13.0–17.0)
Hemoglobin: 9.9 g/dL — ABNORMAL LOW (ref 13.0–17.0)
O2 Saturation: 86 %
O2 Saturation: 99 %
O2 Saturation: 99 %
O2 Saturation: 99 %
Patient temperature: 36.1
Patient temperature: 36.2
Patient temperature: 36.5
Patient temperature: 37.3
Potassium: 4.4 mmol/L (ref 3.5–5.1)
Potassium: 4.4 mmol/L (ref 3.5–5.1)
Potassium: 4.7 mmol/L (ref 3.5–5.1)
Potassium: 4.8 mmol/L (ref 3.5–5.1)
Sodium: 137 mmol/L (ref 135–145)
Sodium: 138 mmol/L (ref 135–145)
Sodium: 138 mmol/L (ref 135–145)
Sodium: 139 mmol/L (ref 135–145)
TCO2: 23 mmol/L (ref 22–32)
TCO2: 24 mmol/L (ref 22–32)
TCO2: 26 mmol/L (ref 22–32)
TCO2: 27 mmol/L (ref 22–32)
pCO2 arterial: 31 mmHg — ABNORMAL LOW (ref 32.0–48.0)
pCO2 arterial: 40 mmHg (ref 32.0–48.0)
pCO2 arterial: 43.5 mmHg (ref 32.0–48.0)
pCO2 arterial: 54.6 mmHg — ABNORMAL HIGH (ref 32.0–48.0)
pH, Arterial: 7.264 — ABNORMAL LOW (ref 7.350–7.450)
pH, Arterial: 7.323 — ABNORMAL LOW (ref 7.350–7.450)
pH, Arterial: 7.405 (ref 7.350–7.450)
pH, Arterial: 7.458 — ABNORMAL HIGH (ref 7.350–7.450)
pO2, Arterial: 116 mmHg — ABNORMAL HIGH (ref 83.0–108.0)
pO2, Arterial: 132 mmHg — ABNORMAL HIGH (ref 83.0–108.0)
pO2, Arterial: 134 mmHg — ABNORMAL HIGH (ref 83.0–108.0)
pO2, Arterial: 49 mmHg — ABNORMAL LOW (ref 83.0–108.0)

## 2019-04-28 LAB — TYPE AND SCREEN
ABO/RH(D): O NEG
Antibody Screen: NEGATIVE
Unit division: 0
Unit division: 0

## 2019-04-28 LAB — CBC
HCT: 28.4 % — ABNORMAL LOW (ref 39.0–52.0)
Hemoglobin: 9.4 g/dL — ABNORMAL LOW (ref 13.0–17.0)
MCH: 28.8 pg (ref 26.0–34.0)
MCHC: 33.1 g/dL (ref 30.0–36.0)
MCV: 87.1 fL (ref 80.0–100.0)
Platelets: 132 10*3/uL — ABNORMAL LOW (ref 150–400)
RBC: 3.26 MIL/uL — ABNORMAL LOW (ref 4.22–5.81)
RDW: 16.1 % — ABNORMAL HIGH (ref 11.5–15.5)
WBC: 13.7 10*3/uL — ABNORMAL HIGH (ref 4.0–10.5)
nRBC: 0.4 % — ABNORMAL HIGH (ref 0.0–0.2)

## 2019-04-28 LAB — HEPATIC FUNCTION PANEL
ALT: 236 U/L — ABNORMAL HIGH (ref 0–44)
AST: 286 U/L — ABNORMAL HIGH (ref 15–41)
Albumin: 1.7 g/dL — ABNORMAL LOW (ref 3.5–5.0)
Alkaline Phosphatase: 429 U/L — ABNORMAL HIGH (ref 38–126)
Bilirubin, Direct: 0.4 mg/dL — ABNORMAL HIGH (ref 0.0–0.2)
Indirect Bilirubin: 0.7 mg/dL (ref 0.3–0.9)
Total Bilirubin: 1.1 mg/dL (ref 0.3–1.2)
Total Protein: 5 g/dL — ABNORMAL LOW (ref 6.5–8.1)

## 2019-04-28 LAB — APTT: aPTT: 53 seconds — ABNORMAL HIGH (ref 24–36)

## 2019-04-28 LAB — GLUCOSE, CAPILLARY
Glucose-Capillary: 113 mg/dL — ABNORMAL HIGH (ref 70–99)
Glucose-Capillary: 120 mg/dL — ABNORMAL HIGH (ref 70–99)
Glucose-Capillary: 137 mg/dL — ABNORMAL HIGH (ref 70–99)
Glucose-Capillary: 150 mg/dL — ABNORMAL HIGH (ref 70–99)
Glucose-Capillary: 178 mg/dL — ABNORMAL HIGH (ref 70–99)
Glucose-Capillary: 202 mg/dL — ABNORMAL HIGH (ref 70–99)

## 2019-04-28 LAB — MAGNESIUM: Magnesium: 2.5 mg/dL — ABNORMAL HIGH (ref 1.7–2.4)

## 2019-04-28 LAB — COOXEMETRY PANEL
Carboxyhemoglobin: 1.4 % (ref 0.5–1.5)
Methemoglobin: 1.2 % (ref 0.0–1.5)
O2 Saturation: 75.9 %
Total hemoglobin: 7.8 g/dL — ABNORMAL LOW (ref 12.0–16.0)

## 2019-04-28 MED ORDER — FENTANYL CITRATE (PF) 100 MCG/2ML IJ SOLN
INTRAMUSCULAR | Status: AC
  Administered 2019-04-28: 100 ug via INTRAVENOUS
  Filled 2019-04-28: qty 2

## 2019-04-28 MED ORDER — MIDAZOLAM HCL 2 MG/2ML IJ SOLN: 4.0000 mg | Freq: Once | INTRAMUSCULAR | Status: AC

## 2019-04-28 MED ORDER — FENTANYL CITRATE (PF) 100 MCG/2ML IJ SOLN: 100.0000 ug | Freq: Once | INTRAMUSCULAR | Status: AC

## 2019-04-28 MED ORDER — VASOPRESSIN 20 UNIT/ML IV SOLN
0.0400 [IU]/min | INTRAVENOUS | Status: DC
  Administered 2019-04-28 – 2019-04-29 (×2): 0.03 [IU]/min via INTRAVENOUS
  Administered 2019-05-01 (×2): 0.04 [IU]/min via INTRAVENOUS
  Filled 2019-04-28 (×5): qty 2

## 2019-04-28 MED ORDER — MIDAZOLAM HCL 2 MG/2ML IJ SOLN
INTRAMUSCULAR | Status: AC
  Administered 2019-04-28: 2 mg via INTRAVENOUS
  Filled 2019-04-28: qty 4

## 2019-04-28 MED ORDER — SODIUM CHLORIDE 0.9 % IV SOLN
2.0000 g | Freq: Two times a day (BID) | INTRAVENOUS | Status: DC
  Administered 2019-04-28 – 2019-04-29 (×5): 2 g via INTRAVENOUS
  Filled 2019-04-28 (×8): qty 2

## 2019-04-28 MED ORDER — FENTANYL CITRATE (PF) 100 MCG/2ML IJ SOLN
50.0000 ug | INTRAMUSCULAR | Status: DC | PRN
  Administered 2019-05-04 (×2): 50 ug via INTRAVENOUS
  Administered 2019-05-04 – 2019-05-05 (×2): 100 ug via INTRAVENOUS
  Administered 2019-05-05 (×3): 50 ug via INTRAVENOUS
  Administered 2019-05-06 (×9): 100 ug via INTRAVENOUS
  Administered 2019-05-06: 50 ug via INTRAVENOUS
  Administered 2019-05-06 (×4): 100 ug via INTRAVENOUS
  Administered 2019-05-07: 150 ug via INTRAVENOUS
  Administered 2019-05-07 (×4): 100 ug via INTRAVENOUS
  Administered 2019-05-07: 200 ug via INTRAVENOUS
  Administered 2019-05-07 (×2): 100 ug via INTRAVENOUS
  Administered 2019-05-07: 200 ug via INTRAVENOUS
  Administered 2019-05-08 (×5): 50 ug via INTRAVENOUS
  Administered 2019-05-08: 100 ug via INTRAVENOUS
  Administered 2019-05-08 – 2019-05-09 (×6): 50 ug via INTRAVENOUS
  Administered 2019-05-09 (×2): 75 ug via INTRAVENOUS
  Administered 2019-05-09 (×3): 50 ug via INTRAVENOUS
  Administered 2019-05-09 (×2): 75 ug via INTRAVENOUS
  Administered 2019-05-09: 50 ug via INTRAVENOUS
  Administered 2019-05-10: 75 ug via INTRAVENOUS
  Administered 2019-05-10 (×3): 100 ug via INTRAVENOUS
  Administered 2019-05-10: 75 ug via INTRAVENOUS
  Administered 2019-05-10 (×3): 100 ug via INTRAVENOUS
  Administered 2019-05-10 (×3): 75 ug via INTRAVENOUS
  Administered 2019-05-11 (×2): 100 ug via INTRAVENOUS
  Administered 2019-05-11: 200 ug via INTRAVENOUS
  Administered 2019-05-11: 100 ug via INTRAVENOUS
  Administered 2019-05-11: 21:00:00 150 ug via INTRAVENOUS
  Administered 2019-05-11 (×3): 100 ug via INTRAVENOUS
  Administered 2019-05-11: 150 ug via INTRAVENOUS
  Administered 2019-05-11 – 2019-05-12 (×6): 100 ug via INTRAVENOUS
  Administered 2019-05-12: 200 ug via INTRAVENOUS
  Administered 2019-05-12 (×2): 50 ug via INTRAVENOUS
  Administered 2019-05-12: 100 ug via INTRAVENOUS
  Administered 2019-05-12 (×2): 50 ug via INTRAVENOUS
  Administered 2019-05-12: 100 ug via INTRAVENOUS
  Administered 2019-05-12: 200 ug via INTRAVENOUS
  Administered 2019-05-12 (×3): 100 ug via INTRAVENOUS
  Administered 2019-05-12 (×2): 50 ug via INTRAVENOUS
  Administered 2019-05-13: 100 ug via INTRAVENOUS
  Administered 2019-05-13 (×2): 200 ug via INTRAVENOUS
  Filled 2019-04-28 (×4): qty 2
  Filled 2019-04-28 (×2): qty 4
  Filled 2019-04-28 (×18): qty 2
  Filled 2019-04-28: qty 4
  Filled 2019-04-28 (×11): qty 2
  Filled 2019-04-28: qty 4
  Filled 2019-04-28 (×13): qty 2
  Filled 2019-04-28: qty 4
  Filled 2019-04-28 (×6): qty 2
  Filled 2019-04-28: qty 4
  Filled 2019-04-28 (×2): qty 2
  Filled 2019-04-28: qty 4
  Filled 2019-04-28: qty 2
  Filled 2019-04-28: qty 4
  Filled 2019-04-28 (×4): qty 2
  Filled 2019-04-28: qty 4
  Filled 2019-04-28 (×4): qty 2
  Filled 2019-04-28: qty 4
  Filled 2019-04-28 (×16): qty 2
  Filled 2019-04-28: qty 4

## 2019-04-28 MED ORDER — POLYETHYLENE GLYCOL 3350 17 G PO PACK
17.0000 g | PACK | Freq: Every day | ORAL | Status: DC
  Administered 2019-04-28 – 2019-05-01 (×4): 17 g via ORAL
  Filled 2019-04-28 (×4): qty 1

## 2019-04-28 MED ORDER — MIDAZOLAM HCL 2 MG/2ML IJ SOLN
2.0000 mg | INTRAMUSCULAR | Status: DC | PRN
  Administered 2019-04-30 – 2019-05-01 (×2): 2 mg via INTRAVENOUS
  Filled 2019-04-28 (×2): qty 2

## 2019-04-28 MED ORDER — FENTANYL 2500MCG IN NS 250ML (10MCG/ML) PREMIX INFUSION
50.0000 ug/h | INTRAVENOUS | Status: DC
  Administered 2019-04-28: 50 ug/h via INTRAVENOUS
  Filled 2019-04-28 (×2): qty 250

## 2019-04-28 MED ORDER — FAMOTIDINE IN NACL 20-0.9 MG/50ML-% IV SOLN: 20.0000 mg | Freq: Two times a day (BID) | INTRAVENOUS | Status: DC

## 2019-04-28 MED ORDER — ROCURONIUM BROMIDE 50 MG/5ML IV SOLN
100.0000 mg | Freq: Once | INTRAVENOUS | Status: AC
  Administered 2019-04-28: 100 mg via INTRAVENOUS

## 2019-04-28 MED ORDER — DEXMEDETOMIDINE HCL IN NACL 400 MCG/100ML IV SOLN
0.0000 ug/kg/h | INTRAVENOUS | Status: DC
  Administered 2019-04-28 – 2019-04-29 (×3): 0.4 ug/kg/h via INTRAVENOUS
  Administered 2019-04-29 – 2019-04-30 (×2): 0.7 ug/kg/h via INTRAVENOUS
  Administered 2019-05-01: 0.5 ug/kg/h via INTRAVENOUS
  Administered 2019-05-01: 0.6 ug/kg/h via INTRAVENOUS
  Administered 2019-05-01: 0.5 ug/kg/h via INTRAVENOUS
  Administered 2019-05-02: 0.6 ug/kg/h via INTRAVENOUS
  Administered 2019-05-02: 0.5 ug/kg/h via INTRAVENOUS
  Administered 2019-05-03: 0.7 ug/kg/h via INTRAVENOUS
  Filled 2019-04-28 (×8): qty 100
  Filled 2019-04-28: qty 50
  Filled 2019-04-28 (×4): qty 100

## 2019-04-28 MED ORDER — CHLORHEXIDINE GLUCONATE 0.12% ORAL RINSE (MEDLINE KIT)
15.0000 mL | Freq: Two times a day (BID) | OROMUCOSAL | Status: DC
  Administered 2019-04-28 – 2019-06-12 (×83): 15 mL via OROMUCOSAL

## 2019-04-28 MED ORDER — VITAL 1.5 CAL PO LIQD
1000.0000 mL | ORAL | Status: DC
  Administered 2019-04-28: 1000 mL
  Filled 2019-04-28 (×2): qty 1000

## 2019-04-28 MED ORDER — ORAL CARE MOUTH RINSE
15.0000 mL | OROMUCOSAL | Status: DC
  Administered 2019-04-28 – 2019-06-13 (×392): 15 mL via OROMUCOSAL

## 2019-04-28 MED ORDER — VANCOMYCIN HCL 750 MG/150ML IV SOLN
750.0000 mg | INTRAVENOUS | Status: DC
  Administered 2019-04-29 – 2019-04-30 (×2): 750 mg via INTRAVENOUS
  Filled 2019-04-28 (×2): qty 150

## 2019-04-28 MED ORDER — MIDAZOLAM HCL 2 MG/2ML IJ SOLN: 2.0000 mg | INTRAMUSCULAR | Status: DC | PRN

## 2019-04-28 MED ORDER — FENTANYL CITRATE (PF) 100 MCG/2ML IJ SOLN
50.0000 ug | INTRAMUSCULAR | Status: AC | PRN
  Administered 2019-05-04 (×3): 50 ug via INTRAVENOUS
  Filled 2019-04-28: qty 2

## 2019-04-28 MED ORDER — EPINEPHRINE 1 MG/10ML IJ SOSY
PREFILLED_SYRINGE | INTRAMUSCULAR | Status: AC
  Filled 2019-04-28: qty 10

## 2019-04-28 MED ORDER — VANCOMYCIN HCL 1500 MG/300ML IV SOLN
1500.0000 mg | Freq: Once | INTRAVENOUS | Status: AC
  Administered 2019-04-28: 1500 mg via INTRAVENOUS
  Filled 2019-04-28: qty 300

## 2019-04-28 MED ORDER — FENTANYL BOLUS VIA INFUSION
50.0000 ug | INTRAVENOUS | Status: DC | PRN
  Administered 2019-04-28: 25 ug via INTRAVENOUS
  Filled 2019-04-28: qty 50

## 2019-04-28 MED ORDER — FLUCONAZOLE IN SODIUM CHLORIDE 200-0.9 MG/100ML-% IV SOLN
200.0000 mg | Freq: Every day | INTRAVENOUS | Status: DC
  Administered 2019-04-28 – 2019-04-29 (×3): 200 mg via INTRAVENOUS
  Filled 2019-04-28 (×3): qty 100

## 2019-04-28 NOTE — Hospital Course (Addendum)
03/18/2019 patient admitted in transfer with recent non-STEMI, heart failure, pulmonary edema, cardiogenic shock  04/01/2019 Right and left heart cath by Dr. Charlett Lango three-vessel CAD, severe mitral regurgitation and pulmonary hypertension and low cardiac output  04/28/2019 right heart cath and placement of balloon pump by Dr. Haroldine Laws  4 1-21 patient intubated for acute pulmonary edema  05/03/2019 placement of Impella 5.5 LVAD via right axillary artery              By Dr. Darcey Nora  05/05/2019 coronary bypass grafting x3 with mitral valve annuloplasty repair using a 26 mm physio 2  ring          by  Dr. Darcey Nora  05/15/2019 removal of Impella 5.5 by Dr. Darcey Nora with TEE showing improved LVEF 40%. Patient had bronch  for secretions and washings grew enterococcus- on antibiotics  04-26-19 patient started on CRT for acute renal failure related to cardiogenic shock and urgent CABG  04-27-19 patient had elective reintubation from BiPAP for increased work of breathing and pulmonary edema related to fluid retention and renal failure  4-13  L subclavian HD cath placed so patient can be better mobilized when extubated.  Patient did well with some pressure support weaning from the ventilator.  4-14 extubation planned today.  1 unit of packed cells given for hemoglobin 7.8 with norepinephrine at 10 mcg per minute  4-14- 21  Patient re-intubated following episode of VT and hypoxic respiratory arrest . Responsive and neuro intact after intubation   4-16 tracheostomy in  OR #6 [place]    4-17 started trach collar during day, rest at night. Cont CVVH  4-21  infiltrates on cxr, low PO2 - back on vent full support, antibiotics escalated  5-3 Back on trach collar 3 days with improved aeration on CXR. Tunneled HD cath placed  5-7 Placed back on vent for hypercarbia PCO2 80 CT shows dense   bilateral infiltrates and R>L pleural effusion R chest tube >>> 1 L clear fluid , sent for culture  Back on CRT for  improved fluid removal  Attempt at iHD 5-25 unsuccessful after dialysis pause 48 hrs- placed back on CRT 5-27

## 2019-04-28 NOTE — Progress Notes (Signed)
Paged Dr. Orvan Seen d/t pt clinically looking worse and nodding yes when asked if his breathing is getting worse. Pt is no longer verbally communicating but still nodding when asked questions. Pt seems to be air hunger. CCM also called, RRT at bedside to evaluate, CXR ordered and ground team set to come evaluate.

## 2019-04-28 NOTE — Progress Notes (Signed)
Patient ID: Brendan [name], male   DOB: 1956-01-08, 64 y.o.   MRN: [id number] [name] Progress Note   Assessment/ Plan:   1. Acute kidney Injury: Anuric and without any renal recovery.  Suspected to be hemodynamically mediated from cardiogenic shock with evolution to ATN status post CABG in patient with low ejection fraction.  Overnight with worsening respiratory failure for which he is now intubated and on ventilator support.  Has since been able to tolerate ultrafiltration of net -200 cc/h which we will continue at this time.  Labs reviewed and CRRT prescription changed accordingly. 2.  Coronary artery disease status post three-vessel CABG with mitral valve replacement on 4/2: Attempting volume unloading with CRRT following respiratory failure.  With evidence of cardiogenic shock. 3.  Anemia: Hemoglobin/hematocrit trend noted, continue to monitor with ongoing overt loss for PRBCs. 4.  Uncontrolled diabetes mellitus: Ongoing insulin/sliding scale management. 5.  Leukocytosis/candiduria: On intravenous cefepime, vancomycin and fluconazole.  Afebrile.  Subjective:   Intubated overnight for worsening respiratory status.   Objective:   BP (!) 97/59   Pulse 95   Temp (!) 97.2 F (36.2 C)   Resp (!) 24   Ht 5' 11"  (1.803 m) Comment: measured x 3  Wt 71.2 kg   SpO2 98%   BMI 21.89 kg/m   Intake/Output Summary (Last 24 hours) at 04/28/2019 0648 Last data filed at 04/28/2019 0600 Gross per 24 hour  Intake 3115.47 ml  Output 4471 ml  Net -1355.53 ml   Weight change: -5.8 kg  Physical Exam: Gen: Intubated, sedated, appears comfortable CVS: Pulse regular rhythm, normal rate, S1 and S2 normal Resp: Anteriorly clear to auscultation, chest tubes in situ.  Sternotomy wound intact. Abd: Soft, flat, nontender Ext: 1-2+ lower extremity edema  Imaging: US RENAL  Result Date: 04/27/2019 CLINICAL DATA:  Acute renal insufficiency. EXAM: RENAL / URINARY TRACT ULTRASOUND  COMPLETE COMPARISON:  None. FINDINGS: Right Kidney: Renal measurements: 12.1 x 5.9 x 7.2 cm = volume: 269 mL . Echogenicity within normal limits. No mass or hydronephrosis visualized. Left Kidney: Renal measurements: 12.6 x 6.2 x 6.2 cm = volume: 253 mL. Echogenicity within normal limits. No mass or hydronephrosis visualized. Bladder: Foley catheter present within a decompressed bladder. Other: Trace fluid adjacent the spleen. IMPRESSION: Normal size kidneys without hydronephrosis. Electronically Signed   By: Marin Olp M.D.   On: 04/27/2019 15:45   DG CHEST PORT 1 VIEW  Result Date: 04/28/2019 CLINICAL DATA:  ETT placement EXAM: PORTABLE CHEST 1 VIEW COMPARISON:  04/27/2019, 04/26/2019, 05/13/2019 FINDINGS: Interval intubation, tip of the endotracheal tube is about 2.5 cm superior to the carina. Right IJ Swan-Ganz catheter tip over the pulmonary outflow tract. Esophageal tube tip is below the diaphragm. Right upper extremity central venous catheter tip over the cavoatrial region. Bilateral chest tubes remain in place. Post sternotomy changes and valve prosthesis. Cardiomegaly with vascular congestion. Continued extensive bilateral interstitial and alveolar disease with some improved aeration in the upper lung zones. Aortic atherosclerosis. No definitive pneumothorax is seen. IMPRESSION: 1. Endotracheal tube tip about 2.5 cm superior to the carina. 2. Cardiomegaly with continued vascular congestion and extensive interstitial and alveolar disease with some improvement in aeration in the upper lung zones. Electronically Signed   By: Donavan Foil M.D.   On: 04/28/2019 02:43   DG Chest Port 1 View  Result Date: 04/28/2019 CLINICAL DATA:  Respiratory distress EXAM: PORTABLE CHEST 1 VIEW COMPARISON:  04/27/2019, 04/26/2019, 04/23/2019, CT chest 04/20/2019 FINDINGS: Esophageal tube tip  is below the diaphragm but incompletely visualized. Post sternotomy changes. Right IJ Swan-Ganz catheter tip projects over the  pulmonary outflow tract. Valve prosthesis. Right upper extremity central venous catheter tip over the cavoatrial region. Similar positioning of bilateral chest tubes. Slightly enlarged cardiomediastinal silhouette with vascular congestion. Little change in extensive bilateral interstitial and alveolar disease. Probable small pleural effusions. No pneumothorax. IMPRESSION: 1. Support lines and tubes as above. 2. Little interval change in cardiomegaly, vascular congestion and extensive bilateral airspace disease, probably reflecting edema, since yesterday's examination. Electronically Signed   By: Donavan Foil M.D.   On: 04/28/2019 01:36   DG Chest Port 1 View  Result Date: 04/27/2019 CLINICAL DATA:  Chronic systolic congestive heart failure. EXAM: PORTABLE CHEST 1 VIEW COMPARISON:  04/26/2019 FINDINGS: Enteric tube courses into the stomach and off the film as tip is not visualized. Right-sided PICC line has tip just above the cavoatrial junction. Right IJ Swan-Ganz catheter, bilateral chest tubes and catheter from below along the inferior heart border unchanged. Lungs are adequately inflated demonstrate persistent hazy bilateral central airspace process with slight worsening over the left perihilar region as findings may be due to interstitial edema and less likely infection. No effusion. No pneumothorax. Cardiomediastinal silhouette and remainder of the exam is unchanged. IMPRESSION: 1. Persistent moderate bilateral hazy central airspace process with slight interval worsening over the left perihilar region. Findings are likely due to moderate interstitial edema and less likely infection. 2.  Tubes and lines as described. Electronically Signed   By: Marin Olp M.D.   On: 04/27/2019 08:26   ECHOCARDIOGRAM LIMITED  Result Date: 04/26/2019    ECHOCARDIOGRAM LIMITED REPORT   Patient Name:   Brendan [name] Date of Exam: 04/26/2019 Medical Rec #:  [id number]        Height:       71.0 in Accession #:     [id number]       Weight:       172.2 lb Date of Birth:  Jul 13, 1955        BSA:          1.979 m Patient Age:    72 years         BP:           115/59 mmHg Patient Gender: M                HR:           111 bpm. Exam Location:  Inpatient Procedure: Limited Echo Indications:    CHF  History:        Patient has prior history of Echocardiogram examinations, most                 recent 05/01/2019. CHF, CAD, Prior CABG and Impella, MV repair;                 Risk Factors:Diabetes.  Sonographer:    Dustin Flock Referring Phys: [id number] Z ATKINS  Sonographer Comments: S/p CABG. IMPRESSIONS  1. Very poor image quality limits accurate assessment of wall motion. There appears to be akinesis of the inferior and inferolateral walls. Possible false tendon in the LV apex but cannot rule out LV thrombus. Left ventricular ejection fraction, by estimation, is 25 to 30%. The left ventricle has severely decreased function. The left ventricle has no regional wall motion abnormalities.  2. Right ventricular systolic function is normal. The right ventricular size is normal.  3. The mitral valve is normal in structure. No  evidence of mitral valve regurgitation. No evidence of mitral stenosis.  4. The aortic valve was not well visualized. Aortic valve regurgitation is not visualized. Mild aortic valve sclerosis is present, with no evidence of aortic valve stenosis.  5. The inferior vena cava is normal in size with greater than 50% respiratory variability, suggesting right atrial pressure of 3 mmHg.  6. Recommend repeat limited echo with definity contrast FINDINGS  Left Ventricle: Very poor image quality limits accurate assessment of wall motion. There appears to be akinesis of the inferior and inferolateral walls. Possible false tendon in the LV apex but cannot rule out LV thrombus. Left ventricular ejection fraction, by estimation, is 25 to 30%. The left ventricle has severely decreased function. The left ventricle has no  regional wall motion abnormalities. The left ventricular internal cavity size was normal in size. There is mild  concentric left ventricular hypertrophy. Right Ventricle: The right ventricular size is normal. No increase in right ventricular wall thickness. Right ventricular systolic function is normal. Left Atrium: Left atrial size was normal in size. Right Atrium: Right atrial size was normal in size. Pericardium: There is no evidence of pericardial effusion. Mitral Valve: The mitral valve is normal in structure. Normal mobility of the mitral valve leaflets. No evidence of mitral valve stenosis. Tricuspid Valve: The tricuspid valve is normal in structure. Tricuspid valve regurgitation is not demonstrated. No evidence of tricuspid stenosis. Aortic Valve: The aortic valve was not well visualized. Aortic valve regurgitation is not visualized. Mild aortic valve sclerosis is present, with no evidence of aortic valve stenosis. Pulmonic Valve: The pulmonic valve was normal in structure. Pulmonic valve regurgitation is not visualized. No evidence of pulmonic stenosis. Aorta: The aortic root is normal in size and structure. Venous: The inferior vena cava is normal in size with greater than 50% respiratory variability, suggesting right atrial pressure of 3 mmHg. IAS/Shunts: No atrial level shunt detected by color flow Doppler.  LEFT VENTRICLE PLAX 2D LVIDd:         5.17 cm LVIDs:         4.64 cm LV PW:         1.18 cm LV IVS:        1.24 cm  Fransico Him MD Electronically signed by Fransico Him MD Signature Date/Time: 04/26/2019/2:33:13 PM    Final     Labs: BMET Recent Labs  Lab 04/24/19 1543 04/24/19 1724 04/20/2019 0316 04/28/2019 1191 04/26/19 0315 04/26/19 0329 04/26/19 1530 04/26/19 1530 04/27/19 4782 04/27/19 9562 04/27/19 1308 04/27/19 0908 04/27/19 1535 04/27/19 1536 04/28/19 0008 04/28/19 0411 04/28/19 0419  NA 133*   < > 134*   < > 133*   < > 135   < > 134*   < > 141 136 137 136 139 137 138  K  4.1   < > 3.4*   < > 4.3   < > 4.2   < > 4.4   < > 3.3* 4.4 4.7 4.5 4.4 4.9 4.8  CL 98  --  99  --  100  --  101  --  101  --   --   --  104  --   --  102  --   CO2 21*  --  22  --  20*  --  21*  --  21*  --   --   --  23  --   --  21*  --   GLUCOSE 266*  --  78  --  214*  --  140*  --  129*  --   --   --  181*  --   --  161*  --   BUN 34*  --  40*  --  56*  --  59*  --  38*  --   --   --  34*  --   --  27*  --   CREATININE 1.88*  --  2.09*  --  2.84*  --  3.01*  --  2.20*  --   --   --  1.94*  --   --  1.84*  --   CALCIUM 7.6*  --  7.9*  --  7.5*  --  7.4*  --  7.5*  --   --   --  7.1*  --   --  7.2*  --   PHOS  --   --  4.0  --  5.0*  --  4.2  --  3.0  --   --   --  2.5  --   --  3.4  --    < > = values in this interval not displayed.   CBC Recent Labs  Lab 05/03/2019 0402 04/21/2019 6256 04/26/19 0315 04/26/19 0329 04/27/19 0425 04/27/19 0437 04/27/19 0944 04/27/19 0944 04/27/19 1536 04/28/19 0008 04/28/19 0411 04/28/19 0419  WBC 11.1*   < > 17.0*  --  18.6*  --  16.2*  --   --   --  13.7*  --   NEUTROABS 8.6*  --   --   --   --   --   --   --   --   --   --   --   HGB 7.4*   < > 9.1*   < > 9.9*   < > 9.5*   < > 9.5* 9.9* 9.4* 10.2*  HCT 22.3*   < > 27.1*   < > 29.4*   < > 28.0*   < > 28.0* 29.0* 28.4* 30.0*  MCV 85.4   < > 85.8  --  85.2  --  84.8  --   --   --  87.1  --   PLT 114*   < > 196  --  212  --  131*  --   --   --  132*  --    < > = values in this interval not displayed.    Medications:    . acetylcysteine  2 mL Nebulization TID  . aspirin EC  325 mg Oral Daily   Or  . aspirin  324 mg Per Tube Daily  . bisacodyl  10 mg Oral Daily   Or  . bisacodyl  10 mg Rectal Daily  . bisacodyl  10 mg Oral Daily  . chlorhexidine gluconate (MEDLINE KIT)  15 mL Mouth Rinse BID  . chlorhexidine gluconate (MEDLINE KIT)  15 mL Mouth Rinse BID  . Chlorhexidine Gluconate Cloth  6 each Topical Daily  . docusate  200 mg Per Tube Daily  . EPINEPHrine      . feeding supplement  (ENSURE ENLIVE)  237 mL Oral TID BM  . feeding supplement (PRO-STAT SUGAR FREE 64)  30 mL Per Tube Daily  . gabapentin  300 mg Per Tube Q8H  . insulin aspart  0-24 Units Subcutaneous Q4H  . insulin glargine  8 Units Subcutaneous BID  . mouth rinse  15 mL Mouth Rinse BID  . mouth rinse  15 mL Mouth Rinse  10 times per day  . pantoprazole sodium  40 mg Per Tube Daily  . pneumococcal 23 valent vaccine  0.5 mL Intramuscular Tomorrow-1000  . polyethylene glycol  17 g Oral Daily  . rosuvastatin  10 mg Per Tube q1800  . sodium chloride flush  10-40 mL Intracatheter Q12H   Elmarie Shiley, MD 04/28/2019, 6:48 AM

## 2019-04-28 NOTE — Progress Notes (Signed)
eLink Physician-Brief Progress Note Patient Name: Brendan [name] DOB: [birth year] MRN: [id number]   Date of Service  04/28/2019  HPI/Events of Note  46 M STEMI s/p CABG and MVR 4/6, s/p Impella extraction. Extubated 4/7. Initiated on CVVH 4/10. Now intubated after failed Bipap due to worsening respiratory and mental status.  eICU Interventions  Continued on CRRT -100 Also on HAP coverage Bedside CCM discussed goals of care with family     Intervention Category Major Interventions: Acid-Base disturbance - evaluation and management;Respiratory failure - evaluation and management Evaluation Type: New Patient Evaluation  Judd Lien 04/28/2019, 5:04 AM

## 2019-04-28 NOTE — Progress Notes (Signed)
Seen and examined. Agree with Gleason/Clark note from earlier. Discussed with TCTS and CHF teams.  Erskine Emery MD PCCM

## 2019-04-28 NOTE — Consult Note (Signed)
NAME:  Brendan [name], MRN:  [id number], DOB:  04-23-1955, LOS: 63 ADMISSION DATE:  03/30/2019, CONSULTATION DATE:  05/15/2019 REFERRING MD:  Dr. Haroldine Laws, CHIEF COMPLAINT:  Respiratory distress  Brief History   49 yoM originally presented with SOB and fatigue at [facility] found to have new HFrEF,  NSTEMI, and bilateral pleural effusions transferred to [facility] on 3/29 for further cardiac evaluation.  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  Started on lasix and milrinone gtts however developed NSVT.  Was taken 4/1 for placement of IABP and swan for optimization prior to CABG.  Was on precedex for agitation/ confusion, some concern for DTs.  On the evening of 4/1, patient developed worsening respiratory distress and hypoxia, PCCM consulted for intubation and vent management.    He was successfully extubated on 4/4 and PCCM signed off.  Impella was removed 4/11 and patient noted to have increased WOB throughout the day he was placed on Bipap, but mental status continued to worsen and PCCM consulted for possible re-intubation.  History of present illness   HPI obtained from medical chart review as patient is s/p emergent intubation and unable to provide history.    64 year old male with prior history of tobacco abuse, HTN, poorly controlled diabetes, and diabetic neuropathy who presented to [facility] on 3/26 with complaints of 4 days history of general malaise and shortness of breath.  He was found to have elevated troponin's, inferior wall EKG changes, and new systolic heart failure with EF 25%.  There was some concern for possible pneumonia vs pulmonary edema and was empirically on ceftriaxone and doxycycline, of which were since stopped given negative procalcitonin and no leukocytosis.  He was transferred to [facility] on 3/29 for further cardiac evaluation and consideration of cardiac catheterization.   At [facility], underwent left and right heart cath which showed severe 3 vessel obstructive CAD.  Repeat echo  showed EF 30-35%, normal RV, and moderate to severe MR with CVP of 25 and LVEDP of 35 with CI 1.9- 2.0.  Heart failure team consulted and he was started on lasix gtt and milrinone gtts.  His shortness of breath did improve with diuresis.  TCTS were consulted for CABG.  However, patient developed multiple PVCs and short runs of NSVT.  He was placed on amiodarone and milrinone held however coox worsened.  Milrinone was restarted and required the addition of lidocaine gtt for ongoing NSVT.   On 4/1, he was taken back for RHC and placement of IABP/ PA catheter to help optimize patient prior to CABG.  Throughout the day 4/1, he became increasing agitated despite precedex gtt.  Some concern for possible DTs.  He has intermittently required norepi for hemodynamic support and remained on 1:1 IABP support.  On the evening of 4/1, patient developed worsening hypoxia despite NRB and increased work of breathing this evening.  PCCM consulted for emergent intubation and mechanical ventilation management.   Pt was extubated on 4/4, re-intubated and extubated for procedure on 4/7.  He was placed on bipap around 4/10.  Impella was removed 4/12 and he was noted to have increased WOB.  Pt was aggressively diuresed on on continuous Bipap, but was hypoxic on ABG with worsening mental status, so PCCM re-consulted for intubation.   Past Medical History  Tobacco abuse, HTN, poorly controlled diabetes, diabetic neuropathy   Significant Hospital Events   3/26 presented to [facility] 3/29 tx to [facility]  3/30 [facility] 4/1  RHC/ IABP/ PA cath 4/2 Impella  placement; bilateral CT placement  4/3 febrile over evening and night. Blood cultures obtained. Vanc/cefepime started. Hematuria--heparin held. 4/4 extubated. CABG planned for Tuesday. abx transitioned to rocephin. Heparin restarted 4/6 re-intubated for CABGx3 and MVR repain 4/7 extubated 4/9 Impella removed 4/10 CRRT started 4/12 Acutely worsening respiratory status, PCCM  re-consulted  Consults:  TCTS PCCM HF  Procedures:  3/30 R/ LHC  4/1 RHC  4/1 IABP R groin >>4/2 4/1 R IJ sheath/ PA cath >> 4/1 Left radial art line >> 4/1 ETT >>4/4 4/2 Impella 4/2 Chest tube right>> 4/2 Chest tube left>> 4/6 CABGx3 and MVR repair 4/12 ETT  Significant Diagnostic Tests:   3/30 R/ LHC >>  Ost LM to Mid LM lesion is 35% stenosed.  Prox LAD lesion is 90% stenosed.  Prox LAD to Mid LAD lesion is 50% stenosed.  Mid Cx lesion is 100% stenosed.  Prox RCA to Mid RCA lesion is 90% stenosed.  RPDA lesion is 95% stenosed.  LV end diastolic pressure is severely elevated.  Hemodynamic findings consistent with moderate pulmonary hypertension. 1. Severe 3 vessel obstructive CAD 2. High LV filling pressures 3. Reduced cardiac output with index 2.3 4. Moderate pulmonary HTN.  3/30 TTE >> 1. Left ventricular ejection fraction, by estimation, is 30 to 35%. The left ventricle has moderately decreased function. The left ventricle demonstrates regional wall motion abnormalities.There is mild left ventricular hypertrophy. Left ventricular diastolic function could not be evaluated. There is severe hypokinesis of the left ventricular, entire inferior wall, inferoseptal wall, apical segment and lateral wall.   2. Right ventricular systolic function is hyperdynamic. The right ventricular size is normal. There is moderately elevated pulmonary artery systolic pressure.   3. Decreased posterior leaflet motion due to ischemic tethering of the mitral valve leaflets.   4. The mitral valve is abnormal. Moderate to severe mitral valve regurgitation.   5. The aortic valve is tricuspid. Aortic valve regurgitation is not visualized. Mild aortic valve sclerosis is present, with no evidence of aortic valve stenosis.   6. The inferior vena cava is normal in size with greater than 50% respiratory variability, suggesting right atrial pressure of 3 mmHg.   4/1 CT chest w/o >> Large  bilateral pleural effusions with associated atelectasis. Moderate to severe bilateral ground-glass opacities, likely reflecting pneumonia and/or edema.   4/1 RHC  >> RA = 18 RV = 60/20  PA = 63/29 (42) PCW = 33 Fick cardiac output/index = 3.7/1.9 PVR = 2.5 WU FA sat = 98% PA sat = 47%, 49% PaPi = 2.2  4/2 CXR>bilateral chest tubes present. Significantly improved since morning xray. Infiltrate vs atelectasis at right base.  04/28/19 CXR>>vascular congestion and extensive bilateral airspace disease, probably reflecting edema  Micro Data:  3/30 MRSA PCR >> neg 4/1 MRSA PCR >> neg 4/2 resp culture>>rare gram pos cocci, few candida albicans 4/3 Urine culture>>negative 4/4 blood cultures>>no growth  4/9 Surgical wound>> rare enterococcus faecalis 4/9 BCx2>> 4/9 Urine Culture>> greater than 100k yeast  Antimicrobials:  PTA ceftriaxone/ doxycycline >> 3/29 Ceftriaxone 4/4-4/5 Cefepime 4/4>>4/11 vanc 4/4>>4/6 Rocephin 4/4>>4/5 Cefuroxime 4/6-4/7 Ampicillin 4/11 Vancomycin 4/12- Cefepime 4/12   Interim history/subjective:  Pt's respiratory status deteriorating requiring re-intubation  Objective   Blood pressure (!) 100/35, pulse 95, temperature 98.4 F (36.9 C), resp. rate (!) 23, height 5\' 11"  (1.803 m), weight 77 kg, SpO2 94 %. PAP: (23-35)/(12-26) 25/14 CVP:  [7 mmHg-20 mmHg] 9 mmHg CO:  [5.1 L/min-6.2 L/min] 5.1 L/min CI:  [2.7 L/min/m2-3.3 L/min/m2] 2.7  L/min/m2  FiO2 (%):  [85 %-100 %] 85 %   Intake/Output Summary (Last 24 hours) at 04/28/2019 0118 Last data filed at 04/28/2019 0000 Gross per 24 hour  Intake 2540.53 ml  Output 4299 ml  Net -1758.47 ml   Filed Weights   04/23/2019 0500 04/26/19 0500 04/27/19 0500  Weight: 73.1 kg 78.1 kg 77 kg    General:  Chronically ill, thin appearing M, somnolent and minimally interactive on bipap HEENT: MM pink/dry Neuro: opens eyes to voice, not otherwise following commands or answering questions CV: s1s2 rrr, no  m/r/g PULM:  Rhonchi and course breath sounds throughout GI: soft, bsx4 active  Extremities: warm/dry, no edema  Skin: no rashes or lesions     Resolved Hospital Problem list     Assessment & Plan:   Acute Hypoxic Respiratory failure  -likely multi-factorial in the setting of cardiogenic shock, likely HCAP and ARDS  P: -diuresis per cardiology/CTS -intubated and placed back on full ventilator support with lung protective ventilation 6cc/kg - SAT/SBT as tolerated, but suspect that patient will require tracheostomy in the setting of third intubation this admission -titrate Vent setting to maintain SpO2 greater than or equal to 90%. -HOB elevated 30 degrees. -Plateau pressures less than 30 cm H20.  -Follow chest x-ray, ABG prn.   -Bronchial hygiene and RT/bronchodilator protocol.  -start broad spectrum antibiotics, Van/Cefepime -Repeat blood cultures pending   HFrEF, Cardiogenic Shock, NSTEMI with 3 vessel disease, NSVT -s/p IABP and Impella -s/p L heart cath and CABGx3 -amiodarone gtt for arrhythmia  P: -management per HF team and cardiothoracic surgery -continues on amiodarone gtt -requiring Levophed and short term Epi gtt   Bilateral Pleural Effusions -likely transudate 2/2 HFrEF and malnutrition -s/p bilateral chest tubes  -output 20-30cc/hr P: -continue tubes to wall suction   Yeast colonization in respiratory and urine cultures -few Candida P: -exchange foley, repeat trach aspirate culture and start fluconazole   AKI -on CRRT per nephrology    Diabetes Mellitus -SSI -continue Lantus 8 units bid continue to monitor.    Best practice:  Diet: npo. Pain/Anxiety/Delirium protocol (if indicated): Fentanyl VAP protocol (if indicated): HOB 30 degrees, daily SBT DVT prophylaxis: heparin  GI prophylaxis: PPI Glucose control: SSI Mobility: BR Code Status: Full  Family Communication: Pt's daughter updated that intubation necessary  overnight Disposition: ICU   Labs   CBC: Recent Labs  Lab 04/21/19 0344 04/21/19 0504 04/21/2019 0402 04/27/2019 1610 04/24/19 1543 04/24/19 1724 04/29/2019 0316 04/21/2019 0336 04/26/19 0315 04/26/19 0329 04/27/19 0425 04/27/19 0425 04/27/19 0437 04/27/19 0908 04/27/19 0944 04/27/19 1536 04/28/19 0008  WBC 18.4*   < > 11.1*   < > 17.2*  --  16.2*  --  17.0*  --  18.6*  --   --   --  16.2*  --   --   NEUTROABS 15.7*  --  8.6*  --   --   --   --   --   --   --   --   --   --   --   --   --   --   HGB 9.1*   < > 7.4*   < > 8.2*   < > 9.4*   < > 9.1*   < > 9.9*   < > 8.8* 9.9* 9.5* 9.5* 9.9*  HCT 26.9*   < > 22.3*   < > 24.6*   < > 27.7*   < > 27.1*   < > 29.4*   < >  26.0* 29.0* 28.0* 28.0* 29.0*  MCV 85.1   < > 85.4   < > 86.9  --  85.8  --  85.8  --  85.2  --   --   --  84.8  --   --   PLT 144*   < > 114*   < > 124*  --  154  --  196  --  212  --   --   --  131*  --   --    < > = values in this interval not displayed.    Basic Metabolic Panel: Recent Labs  Lab 04/21/2019 2230 04/23/2019 2238 04/23/19 0520 04/23/19 0907 04/23/19 1643 04/23/19 1903 05/16/2019 0316 05/05/2019 0336 04/26/19 0315 04/26/19 0329 04/26/19 1530 04/26/19 1530 04/27/19 0425 04/27/19 0429 04/27/19 0429 04/27/19 0437 04/27/19 0908 04/27/19 1535 04/27/19 1536 04/28/19 0008  NA 135   < > 135   < > 135  135   < > 134*   < > 133*   < > 135   < >  --  134*   < > 141 136 137 136 139  K 4.5   < > 4.4   < > 4.4  4.3   < > 3.4*   < > 4.3   < > 4.2   < >  --  4.4   < > 3.3* 4.4 4.7 4.5 4.4  CL 102   < > 104   < > 104   < > 99  --  100  --  101  --   --  101  --   --   --  104  --   --   CO2 23   < > 22   < > 22   < > 22  --  20*  --  21*  --   --  21*  --   --   --  23  --   --   GLUCOSE 105*   < > 99   < > 119*   < > 78  --  214*  --  140*  --   --  129*  --   --   --  181*  --   --   BUN 21   < > 21   < > 22   < > 40*  --  56*  --  59*  --   --  38*  --   --   --  34*  --   --   CREATININE 1.17   < > 1.27*    < > 1.46*   < > 2.09*  --  2.84*  --  3.01*  --   --  2.20*  --   --   --  1.94*  --   --   CALCIUM 7.8*   < > 7.8*   < > 7.7*   < > 7.9*  --  7.5*  --  7.4*  --   --  7.5*  --   --   --  7.1*  --   --   MG 3.4*  --  3.0*  --  2.8*  --  2.5*  --   --   --   --   --  2.4  --   --   --   --   --   --   --   PHOS  --   --   --   --   --   --  4.0  --  5.0*  --  4.2  --   --  3.0  --   --   --  2.5  --   --    < > = values in this interval not displayed.   GFR: Estimated Creatinine Clearance: 42.6 mL/min (A) (by C-G formula based on SCr of 1.94 mg/dL (H)). Recent Labs  Lab 05/07/2019 0316 04/26/19 0315 04/27/19 0425 04/27/19 0906 04/27/19 0944  PROCALCITON  --   --   --  6.93  --   WBC 16.2* 17.0* 18.6*  --  16.2*    Liver Function Tests: Recent Labs  Lab 04/21/19 0925 04/21/19 0925 04/24/19 0344 04/24/19 0344 05/13/2019 0316 05/15/2019 0316 04/26/19 0315 04/26/19 1530 04/27/19 0425 04/27/19 0429 04/27/19 1535  AST 44*  --  48*  --  100*  --  566*  --  295*  --   --   ALT 38  --  34  --  66*  --  194*  --  195*  --   --   ALKPHOS 88  --  99  --  160*  --  328*  --  405*  --   --   BILITOT 0.6  --  1.9*  --  1.7*  --  1.2  --  1.0  --   --   PROT 4.9*  --  4.6*  --  5.2*  --  5.0*  --  5.0*  --   --   ALBUMIN 1.7*   < > 2.2*   < > 2.2*   < > 1.9* 1.8* 1.7* 1.8* 1.7*   < > = values in this interval not displayed.   No results for input(s): LIPASE, AMYLASE in the last 168 hours. No results for input(s): AMMONIA in the last 168 hours.  ABG    Component Value Date/Time   PHART 7.458 (H) 04/28/2019 0008   PCO2ART 31.0 (L) 04/28/2019 0008   PO2ART 49.0 (L) 04/28/2019 0008   HCO3 21.9 04/28/2019 0008   TCO2 23 04/28/2019 0008   ACIDBASEDEF 1.0 04/28/2019 0008   O2SAT 86.0 04/28/2019 0008     Coagulation Profile: Recent Labs  Lab 05/13/2019 1642  INR 1.6*    Cardiac Enzymes: No results for input(s): CKTOTAL, CKMB, CKMBINDEX, TROPONINI in the last 168  hours.  HbA1C: HbA1c, POC (controlled diabetic range)  Date/Time Value Ref Range Status  01/14/2019 02:02 PM 12.5 (A) 0.0 - 7.0 % Final  08/21/2017 02:03 PM 8.5 (A) 0.0 - 7.0 % Final   Hgb A1c MFr Bld  Date/Time Value Ref Range Status  04/13/2019 09:49 PM 9.0 (H) 4.8 - 5.6 % Final    Comment:    (NOTE) Pre diabetes:          5.7%-6.4% Diabetes:              >6.4% Glycemic control for   <7.0% adults with diabetes     CBG: Recent Labs  Lab 04/27/19 1119 04/27/19 1531 04/27/19 1954 04/27/19 2200 04/28/19 0005  GLUCAP 139* 175* 81 80 113*     CRITICAL CARE Performed by: Otilio Carpen Greogory Cornette   Total critical care time: 68 minutes  Critical care time was exclusive of separately billable procedures and treating other patients.  Critical care was necessary to treat or prevent imminent or life-threatening deterioration.  Critical care was time spent personally by me on the following activities: development of treatment plan with patient and/or surrogate as well as nursing, discussions  with consultants, evaluation of patient's response to treatment, examination of patient, obtaining history from patient or surrogate, ordering and performing treatments and interventions, ordering and review of laboratory studies, ordering and review of radiographic studies, pulse oximetry and re-evaluation of patient's condition.    Otilio Carpen Krissy Orebaugh, PA-C

## 2019-04-28 NOTE — Procedures (Signed)
Intubation Procedure Note Brendan [name] [id number] 1955/08/10  Procedure: Intubation Indications: Respiratory insufficiency  Procedure Details Consent: Risks of procedure as well as the alternatives and risks of each were explained to the (patient/caregiver).  Consent for procedure obtained. and discussed with daughter Lawerance Bach Time Out: Verified patient identification, verified procedure, site/side was marked, verified correct patient position, special equipment/implants available, medications/allergies/relevent history reviewed, required imaging and test results available.  Performed  Maximum sterile technique was used including gloves, hand hygiene and mask.  MAC and 3 glidescope S3  Fentanyl 178mg, versed 213mIV Rocuronium 10063mrade 1 view, 1 attempt Desaturated into 40s during intubation. Transiently hypotensive, but responsive to epinephrine infusion.  Evaluation Hemodynamic Status: Transient hypotension treated with pressors; O2 sats: transiently fell during during procedure Patient's Current Condition: stable Complications: No apparent complications Patient did tolerate procedure well. Chest X-ray ordered to verify placement.  CXR: pending.   LauJulian Hy12/2021

## 2019-04-28 NOTE — Progress Notes (Signed)
Pharmacy Antibiotic Note  Brendan [name] is a 64 y.o. male s/p CABG/MVR 4/6, on CRRT, now with worsening SOB and possible PNA.   Pharmacy has been consulted for Vancomycin and Cefepime  dosing.  Plan: Vancomycin 1500 mg IV now, then 750 mg IV q24h Cefepime 2 g IV q12h. Fluconazole 200 mg IV q24h F/U renal function/CRRT   Height: 5\' 11"  (180.3 cm)(measured x 3) Weight: 77 kg (169 lb 12.1 oz) IBW/kg (Calculated) : 75.3  Temp (24hrs), Avg:97.1 F (36.2 C), Min:95.9 F (35.5 C), Max:98.4 F (36.9 C)  Recent Labs  Lab 04/24/19 1543 04/24/19 1543 04/19/2019 0316 04/26/19 0315 04/26/19 1530 04/27/19 0425 04/27/19 0429 04/27/19 0944 04/27/19 1535  WBC 17.2*  --  16.2* 17.0*  --  18.6*  --  16.2*  --   CREATININE 1.88*   < > 2.09* 2.84* 3.01*  --  2.20*  --  1.94*   < > = values in this interval not displayed.    Estimated Creatinine Clearance: 42.6 mL/min (A) (by C-G formula based on SCr of 1.94 mg/dL (H)).    Allergies  Allergen Reactions  . Acetaminophen Itching    Caryl Pina 04/28/2019 1:34 AM

## 2019-04-28 NOTE — Progress Notes (Signed)
Patient ID: Brendan [name], male   DOB: [birth year], 64 y.o.   MRN: [id number]     Advanced Heart Failure Rounding Note  PCP-Cardiologist: No primary care provider on file.   Subjective:    Events - Presented to [facility] with acute HF  EF 20-25% - Transferred to con - Cath 3/30 with severe 3 vessel disease. EF 25% - Developed PMVT on milrinone -> milrinone stopped -> developed worsening shock -> IABP placed on 4/1 - Clinical deterioration 4/1-> intubated - 4/2 underwent Impella 5.5 placement earlier and bilateral chest tubes with 3L out from each side.  - Extubated 4/4 - 04/24/2019 CABG and MVR - Extubated 4/7 - Back to OR for Impella 5.5 extraction 4/9, extubated.  TEE with EF 35%, RV ok, trivial MR s/p MV repair.  - CVVH started 4/10 with oliguria, rising creatinine and CVP.  - Deteriorating respiratory status, intubated again early am 4/12.  Enterococcus faecalis in sputum.   Now intubated, FiO2 on vent.  CXR still with extensive bilateral airspace disease.    On NE 35, amiodarone 30. MAP stable. Co-ox 76%.   Currently pulling net UF 150-200 cc/hr via CVVH, CVP down to 12. I/Os net negative.   He is now on vancomycin/cefepime with E faecalis in sputum, fluconazole with candida in urine.   Echo (4/10): EF 25-30%, normal RV, no significant MR.  I do not think there is an apical thrombus (think trabeculation).   [place]wan  CVP 12 PA 27/20 Thermo CI 2.9 Co-ox 76%   Objective:   Weight Range: 71.2 kg Body mass index is 21.89 kg/m.   Vital Signs:   Temp:  [95.9 F (35.5 C)-98.4 F (36.9 C)] 97.2 F (36.2 C) (04/12 0815) Pulse Rate:  [75-97] 95 (04/12 0815) Resp:  [12-24] 14 (04/12 0815) BP: (77-126)/(35-95) 89/56 (04/12 0800) SpO2:  [87 %-100 %] 98 % (04/12 0817) Arterial Line BP: (84-127)/(36-51) 86/37 (04/12 0815) FiO2 (%):  [60 %-100 %] 60 % (04/12 0817) Weight:  [71.2 kg] 71.2 kg (04/12 0500) Last BM Date: 04/20/19  Weight change: Filed Weights   04/26/19 0500  04/27/19 0500 04/28/19 0500  Weight: 78.1 kg 77 kg 71.2 kg    Intake/Output:   Intake/Output Summary (Last 24 hours) at 04/28/2019 0903 Last data filed at 04/28/2019 0800 Gross per 24 hour  Intake 3093.36 ml  Output 4649 ml  Net -1555.64 ml      Physical Exam   General: Intubated/sedated.  Neck: JVP 10 cm, no thyromegaly or thyroid nodule.  Lungs: Decreased BS at bases.  CV: Nondisplaced PMI.  Heart regular S1/S2, no S3/S4, no murmur.  No peripheral edema.   Abdomen: Soft, nontender, no hepatosplenomegaly, no distention.  Skin: Intact without lesions or rashes.  Neurologic: Sedated on vent. Extremities: No clubbing or cyanosis.  HEENT: Normal.    Telemetry   A-paced in 70s. Personally reviewed  Labs    CBC Recent Labs    04/27/19 0944 04/27/19 1536 04/28/19 0411 04/28/19 0411 04/28/19 0419 04/28/19 0735  WBC 16.2*  --  13.7*  --   --   --   HGB 9.5*   < > 9.4*   < > 10.2* 9.5*  HCT 28.0*   < > 28.4*   < > 30.0* 28.0*  MCV 84.8  --  87.1  --   --   --   PLT 131*  --  132*  --   --   --    < > = values in this  interval not displayed.   Basic Metabolic Panel Recent Labs    04/27/19 0425 04/27/19 0429 04/27/19 1535 04/27/19 1536 04/28/19 0411 04/28/19 0411 04/28/19 0419 04/28/19 0735  NA  --    < > 137   < > 137   < > 138 138  K  --    < > 4.7   < > 4.9   < > 4.8 4.4  CL  --    < > 104  --  102  --   --   --   CO2  --    < > 23  --  21*  --   --   --   GLUCOSE  --    < > 181*  --  161*  --   --   --   BUN  --    < > 34*  --  27*  --   --   --   CREATININE  --    < > 1.94*  --  1.84*  --   --   --   CALCIUM  --    < > 7.1*  --  7.2*  --   --   --   MG 2.4  --   --   --  2.5*  --   --   --   PHOS  --    < > 2.5  --  3.4  --   --   --    < > = values in this interval not displayed.   Liver Function Tests Recent Labs    04/27/19 0425 04/27/19 0429 04/27/19 1535 04/28/19 0411  AST 295*  --   --  286*  ALT 195*  --   --  236*  ALKPHOS 405*  --    --  429*  BILITOT 1.0  --   --  1.1  PROT 5.0*  --   --  5.0*  ALBUMIN 1.7*   < > 1.7* 1.7*  1.6*   < > = values in this interval not displayed.   No results for input(s): LIPASE, AMYLASE in the last 72 hours. Cardiac Enzymes No results for input(s): CKTOTAL, CKMB, CKMBINDEX, TROPONINI in the last 72 hours.  BNP: BNP (last 3 results) Recent Labs    04/12/2019 0113  BNP 655.7*    ProBNP (last 3 results) No results for input(s): PROBNP in the last 8760 hours.   D-Dimer No results for input(s): DDIMER in the last 72 hours. Hemoglobin A1C No results for input(s): HGBA1C in the last 72 hours. Fasting Lipid Panel No results for input(s): CHOL, HDL, LDLCALC, TRIG, CHOLHDL, LDLDIRECT in the last 72 hours. Thyroid Function Tests No results for input(s): TSH, T4TOTAL, T3FREE, THYROIDAB in the last 72 hours.  Invalid input(s): FREET3  Other results:   Imaging    US RENAL  Result Date: 04/27/2019 CLINICAL DATA:  Acute renal insufficiency. EXAM: RENAL / URINARY TRACT ULTRASOUND COMPLETE COMPARISON:  None. FINDINGS: Right Kidney: Renal measurements: 12.1 x 5.9 x 7.2 cm = volume: 269 mL . Echogenicity within normal limits. No mass or hydronephrosis visualized. Left Kidney: Renal measurements: 12.6 x 6.2 x 6.2 cm = volume: 253 mL. Echogenicity within normal limits. No mass or hydronephrosis visualized. Bladder: Foley catheter present within a decompressed bladder. Other: Trace fluid adjacent the spleen. IMPRESSION: Normal size kidneys without hydronephrosis. Electronically Signed   By: Marin Olp M.D.   On: 04/27/2019 15:45   DG CHEST PORT 1 VIEW  Result Date: 04/28/2019 CLINICAL DATA:  ETT placement EXAM: PORTABLE CHEST 1 VIEW COMPARISON:  04/27/2019, 04/26/2019, 04/24/2019 FINDINGS: Interval intubation, tip of the endotracheal tube is about 2.5 cm superior to the carina. Right IJ Swan-Ganz catheter tip over the pulmonary outflow tract. Esophageal tube tip is below the diaphragm.  Right upper extremity central venous catheter tip over the cavoatrial region. Bilateral chest tubes remain in place. Post sternotomy changes and valve prosthesis. Cardiomegaly with vascular congestion. Continued extensive bilateral interstitial and alveolar disease with some improved aeration in the upper lung zones. Aortic atherosclerosis. No definitive pneumothorax is seen. IMPRESSION: 1. Endotracheal tube tip about 2.5 cm superior to the carina. 2. Cardiomegaly with continued vascular congestion and extensive interstitial and alveolar disease with some improvement in aeration in the upper lung zones. Electronically Signed   By: Donavan Foil M.D.   On: 04/28/2019 02:43   DG Chest Port 1 View  Result Date: 04/28/2019 CLINICAL DATA:  Respiratory distress EXAM: PORTABLE CHEST 1 VIEW COMPARISON:  04/27/2019, 04/26/2019, 05/15/2019, CT chest 04/26/2019 FINDINGS: Esophageal tube tip is below the diaphragm but incompletely visualized. Post sternotomy changes. Right IJ Swan-Ganz catheter tip projects over the pulmonary outflow tract. Valve prosthesis. Right upper extremity central venous catheter tip over the cavoatrial region. Similar positioning of bilateral chest tubes. Slightly enlarged cardiomediastinal silhouette with vascular congestion. Little change in extensive bilateral interstitial and alveolar disease. Probable small pleural effusions. No pneumothorax. IMPRESSION: 1. Support lines and tubes as above. 2. Little interval change in cardiomegaly, vascular congestion and extensive bilateral airspace disease, probably reflecting edema, since yesterday's examination. Electronically Signed   By: Donavan Foil M.D.   On: 04/28/2019 01:36     Medications:     Scheduled Medications: . acetylcysteine  2 mL Nebulization TID  . aspirin EC  325 mg Oral Daily   Or  . aspirin  324 mg Per Tube Daily  . bisacodyl  10 mg Oral Daily   Or  . bisacodyl  10 mg Rectal Daily  . bisacodyl  10 mg Oral Daily  .  chlorhexidine gluconate (MEDLINE KIT)  15 mL Mouth Rinse BID  . chlorhexidine gluconate (MEDLINE KIT)  15 mL Mouth Rinse BID  . Chlorhexidine Gluconate Cloth  6 each Topical Daily  . docusate  200 mg Per Tube Daily  . EPINEPHrine      . feeding supplement (ENSURE ENLIVE)  237 mL Oral TID BM  . feeding supplement (PRO-STAT SUGAR FREE 64)  30 mL Per Tube Daily  . gabapentin  300 mg Per Tube Q8H  . insulin aspart  0-24 Units Subcutaneous Q4H  . insulin glargine  8 Units Subcutaneous BID  . mouth rinse  15 mL Mouth Rinse BID  . mouth rinse  15 mL Mouth Rinse 10 times per day  . pantoprazole sodium  40 mg Per Tube Daily  . pneumococcal 23 valent vaccine  0.5 mL Intramuscular Tomorrow-1000  . polyethylene glycol  17 g Oral Daily  . rosuvastatin  10 mg Per Tube q1800  . sodium chloride flush  10-40 mL Intracatheter Q12H    Infusions: .  prismasol BGK 4/2.5 400 mL/hr at 04/27/19 1215  .  prismasol BGK 4/2.5 200 mL/hr at 04/27/19 1215  . sodium chloride 10 mL/hr at 04/28/19 0800  . sodium chloride    . sodium chloride 10 mL/hr at 04/21/2019 1800  . sodium chloride 20 mL/hr at 05/15/2019 1800  . sodium chloride    . amiodarone 30 mg/hr (04/28/19 0800)  .  ceFEPime (MAXIPIME) IV Stopped (04/28/19 0251)  . dexmedetomidine 0.4 mcg/kg/hr (04/28/19 0846)  . epinephrine Stopped (04/28/19 0138)  . feeding supplement (VITAL 1.5 CAL) 40 mL/hr at 04/28/19 0800  . fentaNYL infusion INTRAVENOUS 125 mcg/hr (04/28/19 0800)  . fluconazole (DIFLUCAN) IV Stopped (04/28/19 0341)  . heparin 10,000 units/ 20 mL infusion syringe 750 Units/hr (04/28/19 0847)  . lactated ringers Stopped (04/23/2019 1640)  . lactated ringers 10 mL/hr at 04/28/19 0800  . norepinephrine (LEVOPHED) Adult infusion 35 mcg/min (04/28/19 0800)  . prismasol BGK 4/2.5 1,800 mL/hr at 04/28/19 0207  . [START ON 04/29/2019] vancomycin    . vasopressin (PITRESSIN) infusion - *FOR SHOCK* 0.03 Units/min (04/28/19 0844)    PRN Medications: sodium  chloride, Place/Maintain arterial line **AND** sodium chloride, alteplase, fentaNYL, fentaNYL (SUBLIMAZE) injection, fentaNYL (SUBLIMAZE) injection, heparin, levalbuterol, metoprolol tartrate, midazolam, ondansetron (ZOFRAN) IV, ondansetron (ZOFRAN) IV, sodium chloride, sodium chloride flush, sodium chloride flush     Assessment/Plan    1.Acute systolic HF ->  Cardiogenic Shock  - Due to iCM. EF 20-25% - Impella 5.5 placed on 4/2.   - s/p CABG/MVRepair on 4/6  - Impella out 4/9.  - Echo 4/10 with EF 25-30%, normal RV, no MR.   - Now on CVVH with volume overload/progressive renal failure.  - CI 2.9 from [place] but co-ox 76% on norepinephrine 35. Wean NE as tolerated.   - Volume status improved, CVP remains 12.  Currently pulling 150-200 cc/hr net UF by CVVH, continue.   2. CAD - LHC with severe 3 vessel CAD  - s/p CABG x 3 MV Repair 4/6  - on ASA/Crestor  3. Acute hypoxic respiratory failure - Extubated 4/9.  - CXR with extensive pulmonary edema and ?PNA component still, re-intubated on 4/12.   - Continue UF by CVVH + antibiotics.    4. Mitral Regurgitation - Mod-severe on ECHO - s/p MVrepair, TEE 4/9 with minimal MR s/p repair.   - Echo 4/10 with no significant MR.   5. Uncontrolled DM - Hgb A1C 9.  - On insulin and sliding scale.   6. AKI - due to shock - Became oliguric and now on CVVH to control volume overload.  Renal following.   7. Acute blood loss Anemia  -transfuse as need to keep Hgb > 7.5. Stable.   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   - Keep  Mag >2   9. 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 No sensation R and L foot and occasionaly in his hands. Requires assistance with ADLs.   10. Severe Malnutrition - Prealbumin 8.9  - Nutrition on board - Continue TFs  11. ID - Enterococcus faecalis in sputum => vancomycin/cefepime.  - Candida in urine => fluconazole.   12. Elevated LFTs - Suspect shock liver, trending down.    CRITICAL CARE Performed by: Loralie Champagne  Total critical care time:40 minutes  Critical care time was exclusive of separately billable procedures and treating other patients.  Critical care was necessary to treat or prevent imminent or life-threatening deterioration.  Critical care was time spent personally by me (independent of midlevel providers or residents) on the following activities: development of treatment plan with patient and/or surrogate as well as nursing, discussions with consultants, evaluation of patient's response to treatment, examination of patient, obtaining history from patient or surrogate, ordering and performing treatments and interventions, ordering and review of laboratory studies, ordering and review of radiographic studies, pulse oximetry and re-evaluation of patient's condition.  Length of Stay: [place], MD  04/28/2019, 9:03 AM  Advanced Heart Failure Team Pager [phone] (M-F; 7a - 4p)  Please contact [facility] for night-coverage after hours (4p -7a ) and weekends on [email or web address]

## 2019-04-28 NOTE — Progress Notes (Signed)
Patient ID: Brendan [name], male   DOB: 1955/07/07, 64 y.o.   MRN: [id number] TCTS Evening Rounds:  Has been hemodynamically stable today with NE down from 35 this am to 24 tonight.  CRRT removing 50-100 this afternoon. CVP down to 6.  Sedated on vent.  Hgb 9.5 K+ 4.7 ABG ok

## 2019-04-28 NOTE — Progress Notes (Signed)
3 Days Post-Op Procedure(s) (LRB): REMOVAL OF IMPELLA 5.5 direct, LEFT VENTRICULAR ASSIST DEVICE.with CELL SAVER, TEE (N/A) TRANSESOPHAGEAL ECHOCARDIOGRAM (TEE) (N/A) Bronchial Washings (N/A) Subjective: Intubated overnight due to fatigue, pulmonary edema related to renal failure.  Patient alert and responsive on ventilator, chest x-ray with interstitial edema and some basilar atelectasis.  Patient on CRT for fluid removal after he developed postoperative acute renal failure. Patient remains in sinus rhythm on amiodarone with cardiac output 5 L/min Sputum culture with Enterococcus-now on IV cefepime and vancomycin Tube feeds being advanced to goal.  Objective: Vital signs in last 24 hours: Temp:  [95.9 F (35.5 C)-98.4 F (36.9 C)] 97.2 F (36.2 C) (04/12 0815) Pulse Rate:  [75-97] 95 (04/12 0815) Cardiac Rhythm: Atrial paced (04/12 0800) Resp:  [12-24] 14 (04/12 0815) BP: (77-126)/(35-95) 89/56 (04/12 0800) SpO2:  [87 %-100 %] 98 % (04/12 0817) Arterial Line BP: (84-127)/(36-51) 86/37 (04/12 0815) FiO2 (%):  [60 %-100 %] 60 % (04/12 0817) Weight:  [71.2 kg] 71.2 kg (04/12 0500)  Hemodynamic parameters for last 24 hours: PAP: (20-35)/(12-26) 27/20 CVP:  [5 mmHg-19 mmHg] 12 mmHg CO:  [4.8 L/min-6.2 L/min] 5.5 L/min CI:  [2.6 L/min/m2-3.3 L/min/m2] 2.9 L/min/m2  Intake/Output from previous day: 04/11 0701 - 04/12 0700 In: 3083.7 [I.V.:1763.7; NG/GT:720; IV Piggyback:600] Out: 4577 [Urine:30; Chest Tube:310] Intake/Output this shift: Total I/O In: 111.8 [I.V.:81.8; NG/GT:30] Out: 303 [Other:303]  Exam Alert on ventilator Scattered rhonchi No murmur Sternal incision clean and dry Abdomen soft nontender Extremities warm with minimal edema  Lab Results: Recent Labs    04/27/19 0944 04/27/19 1536 04/28/19 0411 04/28/19 0411 04/28/19 0419 04/28/19 0735  WBC 16.2*  --  13.7*  --   --   --   HGB 9.5*   < > 9.4*   < > 10.2* 9.5*  HCT 28.0*   < > 28.4*   < > 30.0* 28.0*   PLT 131*  --  132*  --   --   --    < > = values in this interval not displayed.   BMET:  Recent Labs    04/27/19 1535 04/27/19 1536 04/28/19 0411 04/28/19 0411 04/28/19 0419 04/28/19 0735  NA 137   < > 137   < > 138 138  K 4.7   < > 4.9   < > 4.8 4.4  CL 104  --  102  --   --   --   CO2 23  --  21*  --   --   --   GLUCOSE 181*  --  161*  --   --   --   BUN 34*  --  27*  --   --   --   CREATININE 1.94*  --  1.84*  --   --   --   CALCIUM 7.1*  --  7.2*  --   --   --    < > = values in this interval not displayed.    PT/INR: No results for input(s): LABPROT, INR in the last 72 hours. ABG    Component Value Date/Time   PHART 7.323 (L) 04/28/2019 0735   HCO3 22.8 04/28/2019 0735   TCO2 24 04/28/2019 0735   ACIDBASEDEF 3.0 (H) 04/28/2019 0735   O2SAT 99.0 04/28/2019 0735   CBG (last 3)  Recent Labs    04/28/19 0005 04/28/19 0417 04/28/19 0752  GLUCAP 113* 150* 137*    Assessment/Plan: S/P Procedure(s) (LRB): REMOVAL OF IMPELLA 5.5 direct, LEFT VENTRICULAR ASSIST  DEVICE.with CELL SAVER, TEE (N/A) TRANSESOPHAGEAL ECHOCARDIOGRAM (TEE) (N/A) Bronchial Washings (N/A) Preoperative heart failure cardiogenic shock and ischemic cardiomyopathy with severe protein loss malnutrition  postop CABG with perioperative support from Impella 5.5 Now with postop acute renal failure and respiratory insufficiency requiring intubation With stable hemodynamics and neuro status intact he would be expected to recover from his dual organ failure. Continue antibiotics and tube feeds and CRRT, rest on ventilator  LOS: 14 days    Brendan [name] 04/28/2019

## 2019-04-29 ENCOUNTER — Inpatient Hospital Stay (HOSPITAL_COMMUNITY): Payer: Medicaid Other

## 2019-04-29 DIAGNOSIS — J9611 Chronic respiratory failure with hypoxia: Secondary | ICD-10-CM

## 2019-04-29 LAB — RENAL FUNCTION PANEL
Albumin: 1.5 g/dL — ABNORMAL LOW (ref 3.5–5.0)
Albumin: 1.4 g/dL — ABNORMAL LOW (ref 3.5–5.0)
Anion gap: 12 (ref 5–15)
Anion gap: 11 (ref 5–15)
BUN: 27 mg/dL — ABNORMAL HIGH (ref 8–23)
BUN: 28 mg/dL — ABNORMAL HIGH (ref 8–23)
CO2: 21 mmol/L — ABNORMAL LOW (ref 22–32)
CO2: 21 mmol/L — ABNORMAL LOW (ref 22–32)
Calcium: 7.2 mg/dL — ABNORMAL LOW (ref 8.9–10.3)
Calcium: 7 mg/dL — ABNORMAL LOW (ref 8.9–10.3)
Chloride: 102 mmol/L (ref 98–111)
Chloride: 105 mmol/L (ref 98–111)
Creatinine, Ser: 1.63 mg/dL — ABNORMAL HIGH (ref 0.61–1.24)
Creatinine, Ser: 1.58 mg/dL — ABNORMAL HIGH (ref 0.61–1.24)
GFR calc Af Amer: 52 mL/min — ABNORMAL LOW (ref >60)
GFR calc Af Amer: 54 mL/min — ABNORMAL LOW (ref >60)
GFR calc non Af Amer: 45 mL/min — ABNORMAL LOW (ref >60)
GFR calc non Af Amer: 47 mL/min — ABNORMAL LOW (ref >60)
Glucose, Bld: 167 mg/dL — ABNORMAL HIGH (ref 70–99)
Glucose, Bld: 178 mg/dL — ABNORMAL HIGH (ref 70–99)
Phosphorus: 1.6 mg/dL — ABNORMAL LOW (ref 2.5–4.6)
Phosphorus: 1.6 mg/dL — ABNORMAL LOW (ref 2.5–4.6)
Potassium: 5 mmol/L (ref 3.5–5.1)
Potassium: 5.2 mmol/L — ABNORMAL HIGH (ref 3.5–5.1)
Sodium: 135 mmol/L (ref 135–145)
Sodium: 137 mmol/L (ref 135–145)

## 2019-04-29 LAB — CBC
HCT: 27.6 % — ABNORMAL LOW (ref 39.0–52.0)
Hemoglobin: 9.2 g/dL — ABNORMAL LOW (ref 13.0–17.0)
MCH: 28.6 pg (ref 26.0–34.0)
MCHC: 33.3 g/dL (ref 30.0–36.0)
MCV: 85.7 fL (ref 80.0–100.0)
Platelets: 170 10*3/uL (ref 150–400)
RBC: 3.22 MIL/uL — ABNORMAL LOW (ref 4.22–5.81)
RDW: 16.7 % — ABNORMAL HIGH (ref 11.5–15.5)
WBC: 10.9 10*3/uL — ABNORMAL HIGH (ref 4.0–10.5)
nRBC: 0.3 % — ABNORMAL HIGH (ref 0.0–0.2)

## 2019-04-29 LAB — POCT I-STAT 7, (LYTES, BLD GAS, ICA,H+H)
Acid-Base Excess: 2 mmol/L (ref 0.0–2.0)
Bicarbonate: 26.6 mmol/L (ref 20.0–28.0)
Calcium, Ion: 1.07 mmol/L — ABNORMAL LOW (ref 1.15–1.40)
HCT: 30 % — ABNORMAL LOW (ref 39.0–52.0)
Hemoglobin: 10.2 g/dL — ABNORMAL LOW (ref 13.0–17.0)
O2 Saturation: 100 %
Patient temperature: 35.5
Potassium: 4.8 mmol/L (ref 3.5–5.1)
Sodium: 136 mmol/L (ref 135–145)
TCO2: 28 mmol/L (ref 22–32)
pCO2 arterial: 36.3 mmHg (ref 32.0–48.0)
pH, Arterial: 7.466 — ABNORMAL HIGH (ref 7.350–7.450)
pO2, Arterial: 181 mmHg — ABNORMAL HIGH (ref 83.0–108.0)

## 2019-04-29 LAB — COOXEMETRY PANEL
Carboxyhemoglobin: 0.8 % (ref 0.5–1.5)
Methemoglobin: 0.6 % (ref 0.0–1.5)
O2 Saturation: 59.8 %
Total hemoglobin: 12.5 g/dL (ref 12.0–16.0)

## 2019-04-29 LAB — HEPATIC FUNCTION PANEL
ALT: 242 U/L — ABNORMAL HIGH (ref 0–44)
AST: 167 U/L — ABNORMAL HIGH (ref 15–41)
Albumin: 1.5 g/dL — ABNORMAL LOW (ref 3.5–5.0)
Alkaline Phosphatase: 499 U/L — ABNORMAL HIGH (ref 38–126)
Bilirubin, Direct: 0.3 mg/dL — ABNORMAL HIGH (ref 0.0–0.2)
Indirect Bilirubin: 0.7 mg/dL (ref 0.3–0.9)
Total Bilirubin: 1 mg/dL (ref 0.3–1.2)
Total Protein: 5.3 g/dL — ABNORMAL LOW (ref 6.5–8.1)

## 2019-04-29 LAB — MAGNESIUM: Magnesium: 2.5 mg/dL — ABNORMAL HIGH (ref 1.7–2.4)

## 2019-04-29 LAB — APTT: aPTT: 42 seconds — ABNORMAL HIGH (ref 24–36)

## 2019-04-29 LAB — GLUCOSE, CAPILLARY
Glucose-Capillary: 118 mg/dL — ABNORMAL HIGH (ref 70–99)
Glucose-Capillary: 145 mg/dL — ABNORMAL HIGH (ref 70–99)
Glucose-Capillary: 147 mg/dL — ABNORMAL HIGH (ref 70–99)
Glucose-Capillary: 152 mg/dL — ABNORMAL HIGH (ref 70–99)
Glucose-Capillary: 161 mg/dL — ABNORMAL HIGH (ref 70–99)
Glucose-Capillary: 170 mg/dL — ABNORMAL HIGH (ref 70–99)

## 2019-04-29 MED ORDER — MILRINONE LACTATE IN DEXTROSE 20-5 MG/100ML-% IV SOLN
0.1250 ug/kg/min | INTRAVENOUS | Status: DC
  Administered 2019-04-29 – 2019-05-12 (×11): 0.125 ug/kg/min via INTRAVENOUS
  Filled 2019-04-29 (×10): qty 100

## 2019-04-29 MED ORDER — VITAL 1.5 CAL PO LIQD
1000.0000 mL | ORAL | Status: DC
  Filled 2019-04-29: qty 1000

## 2019-04-29 MED ORDER — PRISMASOL BGK 0/2.5 32-2.5 MEQ/L REPLACEMENT SOLN
Status: DC
  Filled 2019-04-29 (×4): qty 5000

## 2019-04-29 MED ORDER — B COMPLEX-C PO TABS
1.0000 | ORAL_TABLET | Freq: Every day | ORAL | Status: DC
  Administered 2019-04-29 – 2019-06-20 (×53): 1
  Filled 2019-04-29 (×54): qty 1

## 2019-04-29 MED ORDER — VITAL AF 1.2 CAL PO LIQD
1000.0000 mL | ORAL | Status: DC
  Administered 2019-04-29 – 2019-05-02 (×4): 1000 mL

## 2019-04-29 MED ORDER — SORBITOL 70 % SOLN
30.0000 mL | Freq: Two times a day (BID) | Status: DC
  Administered 2019-04-29 – 2019-05-01 (×5): 30 mL
  Filled 2019-04-29 (×5): qty 30

## 2019-04-29 MED ORDER — PRO-STAT SUGAR FREE PO LIQD
30.0000 mL | Freq: Two times a day (BID) | ORAL | Status: DC
  Administered 2019-04-29 – 2019-05-05 (×12): 30 mL
  Filled 2019-04-29 (×12): qty 30

## 2019-04-29 NOTE — Progress Notes (Signed)
Patient ID: Brendan [name], male   DOB: 25-Apr-1955, 64 y.o.   MRN: [id number] [facility] Progress Note   Assessment/ Plan:   1. Acute kidney Injury: Anuric and without evidence of renal recovery.  Suspected to be hemodynamically mediated from cardiogenic shock with evolution to ATN status post CABG in patient with low ejection fraction. Continues to tolerate volume unloading with CVP now ranging 8-9.  I will follow his afternoon labs with regards to making any adjustments with dialysate potassium content. 2.  Coronary artery disease status post three-vessel CABG with mitral valve replacement on 4/2: With cardiogenic shock and efforts at CRRT for volume unloading. 3.  Anemia: Hemoglobin/hematocrit trend noted, continue to monitor with ongoing overt loss for PRBCs. 4.  Uncontrolled diabetes mellitus: Ongoing insulin/sliding scale management. 5.  Leukocytosis/candiduria/Enterococcus from sputum culture: On intravenous cefepime, vancomycin and fluconazole.  Afebrile.  Subjective:   Tolerating ultrafiltration of net -100-200 cc/h with decreasing pressor requirements.   Objective:   BP 104/71   Pulse 95   Temp (!) 96.4 F (35.8 C)   Resp (!) 22   Ht 5' 11" (1.803 m) Comment: measured x 3  Wt 68.1 kg   SpO2 100%   BMI 20.94 kg/m   Intake/Output Summary (Last 24 hours) at 04/29/2019 0619 Last data filed at 04/29/2019 0600 Gross per 24 hour  Intake 3258.61 ml  Output 6418 ml  Net -3159.39 ml   Weight change: -3.1 kg  Physical Exam: Gen: Intubated, sedated and resting comfortably CVS: Pulse regular rhythm, normal rate, S1 and S2 normal Resp: Anteriorly clear to auscultation, chest tubes in situ.  Sternotomy wound intact. Abd: Soft, flat, nontender Ext: 1+ lower extremity edema, trace-1+ dependent edema  Imaging: US RENAL  Result Date: 04/27/2019 CLINICAL DATA:  Acute renal insufficiency. EXAM: RENAL / URINARY TRACT ULTRASOUND COMPLETE COMPARISON:  None. FINDINGS:  Right Kidney: Renal measurements: 12.1 x 5.9 x 7.2 cm = volume: 269 mL . Echogenicity within normal limits. No mass or hydronephrosis visualized. Left Kidney: Renal measurements: 12.6 x 6.2 x 6.2 cm = volume: 253 mL. Echogenicity within normal limits. No mass or hydronephrosis visualized. Bladder: Foley catheter present within a decompressed bladder. Other: Trace fluid adjacent the spleen. IMPRESSION: Normal size kidneys without hydronephrosis. Electronically Signed   By: Marin Olp M.D.   On: 04/27/2019 15:45   DG CHEST PORT 1 VIEW  Result Date: 04/28/2019 CLINICAL DATA:  ETT placement EXAM: PORTABLE CHEST 1 VIEW COMPARISON:  04/27/2019, 04/26/2019, 05/10/2019 FINDINGS: Interval intubation, tip of the endotracheal tube is about 2.5 cm superior to the carina. Right IJ Swan-Ganz catheter tip over the pulmonary outflow tract. Esophageal tube tip is below the diaphragm. Right upper extremity central venous catheter tip over the cavoatrial region. Bilateral chest tubes remain in place. Post sternotomy changes and valve prosthesis. Cardiomegaly with vascular congestion. Continued extensive bilateral interstitial and alveolar disease with some improved aeration in the upper lung zones. Aortic atherosclerosis. No definitive pneumothorax is seen. IMPRESSION: 1. Endotracheal tube tip about 2.5 cm superior to the carina. 2. Cardiomegaly with continued vascular congestion and extensive interstitial and alveolar disease with some improvement in aeration in the upper lung zones. Electronically Signed   By: Donavan Foil M.D.   On: 04/28/2019 02:43   DG Chest Port 1 View  Result Date: 04/28/2019 CLINICAL DATA:  Respiratory distress EXAM: PORTABLE CHEST 1 VIEW COMPARISON:  04/27/2019, 04/26/2019, 05/08/2019, CT chest 04/30/2019 FINDINGS: Esophageal tube tip is below the diaphragm but incompletely visualized. Post sternotomy  changes. Right IJ Swan-Ganz catheter tip projects over the pulmonary outflow tract. Valve  prosthesis. Right upper extremity central venous catheter tip over the cavoatrial region. Similar positioning of bilateral chest tubes. Slightly enlarged cardiomediastinal silhouette with vascular congestion. Little change in extensive bilateral interstitial and alveolar disease. Probable small pleural effusions. No pneumothorax. IMPRESSION: 1. Support lines and tubes as above. 2. Little interval change in cardiomegaly, vascular congestion and extensive bilateral airspace disease, probably reflecting edema, since yesterday's examination. Electronically Signed   By: Donavan Foil M.D.   On: 04/28/2019 01:36    Labs: BMET Recent Labs  Lab 04/26/19 0315 04/26/19 0329 04/26/19 1530 04/26/19 1530 04/27/19 0429 04/27/19 0437 04/27/19 1535 04/27/19 1536 04/28/19 0008 04/28/19 0411 04/28/19 0419 04/28/19 0735 04/28/19 1526 04/28/19 1622 04/29/19 0444  NA 133*   < > 135   < > 134*   < > 137   < > 139 137 138 138 137 137 135  K 4.3   < > 4.2   < > 4.4   < > 4.7   < > 4.4 4.9 4.8 4.4 4.7 4.9 5.0  CL 100  --  101  --  101  --  104  --   --  102  --   --   --  103 102  CO2 20*  --  21*  --  21*  --  23  --   --  21*  --   --   --  24 21*  GLUCOSE 214*  --  140*  --  129*  --  181*  --   --  161*  --   --   --  227* 167*  BUN 56*  --  59*  --  38*  --  34*  --   --  27*  --   --   --  27* 27*  CREATININE 2.84*  --  3.01*  --  2.20*  --  1.94*  --   --  1.84*  --   --   --  1.65* 1.63*  CALCIUM 7.5*  --  7.4*  --  7.5*  --  7.1*  --   --  7.2*  --   --   --  7.0* 7.2*  PHOS 5.0*  --  4.2  --  3.0  --  2.5  --   --  3.4  --   --   --  2.3* 1.6*   < > = values in this interval not displayed.   CBC Recent Labs  Lab 04/27/19 0425 04/27/19 0437 04/27/19 0944 04/27/19 1536 04/28/19 0411 04/28/19 0411 04/28/19 0419 04/28/19 0735 04/28/19 1526 04/29/19 0444  WBC 18.6*  --  16.2*  --  13.7*  --   --   --   --  10.9*  HGB 9.9*   < > 9.5*   < > 9.4*   < > 10.2* 9.5* 9.5* 9.2*  HCT 29.4*   < >  28.0*   < > 28.4*   < > 30.0* 28.0* 28.0* 27.6*  MCV 85.2  --  84.8  --  87.1  --   --   --   --  85.7  PLT 212  --  131*  --  132*  --   --   --   --  170   < > = values in this interval not displayed.    Medications:    . acetylcysteine  2 mL Nebulization  TID  . aspirin EC  325 mg Oral Daily   Or  . aspirin  324 mg Per Tube Daily  . bisacodyl  10 mg Oral Daily   Or  . bisacodyl  10 mg Rectal Daily  . chlorhexidine gluconate (MEDLINE KIT)  15 mL Mouth Rinse BID  . Chlorhexidine Gluconate Cloth  6 each Topical Daily  . docusate  200 mg Per Tube Daily  . feeding supplement (ENSURE ENLIVE)  237 mL Oral TID BM  . feeding supplement (PRO-STAT SUGAR FREE 64)  30 mL Per Tube Daily  . gabapentin  300 mg Per Tube Q8H  . insulin aspart  0-24 Units Subcutaneous Q4H  . insulin glargine  8 Units Subcutaneous BID  . mouth rinse  15 mL Mouth Rinse 10 times per day  . pantoprazole sodium  40 mg Per Tube Daily  . pneumococcal 23 valent vaccine  0.5 mL Intramuscular Tomorrow-1000  . polyethylene glycol  17 g Oral Daily  . rosuvastatin  10 mg Per Tube q1800  . sodium chloride flush  10-40 mL Intracatheter Q12H   Elmarie Shiley, MD 04/29/2019, 6:19 AM

## 2019-04-29 NOTE — Progress Notes (Signed)
TCTS Evening Rounds  Remains intubated/sedated CXR improved Hemodynamically stable  Intake/Output Summary (Last 24 hours) at 04/29/2019 1915 Last data filed at 04/29/2019 1900 Gross per 24 hour  Intake 3351.39 ml  Output 5128 ml  Net -1776.61 ml  BP (!) 85/46   Pulse 95   Temp 99.8 F (37.7 C) (Oral)   Resp 14   Ht 5\' 11"  (1.803 m) Comment: measured x 3  Wt 68.1 kg   SpO2 98%   BMI 20.94 kg/m    Exam unremarkable  A/p: continue present management with CVVHD Brendan [name], Brendan [name]

## 2019-04-29 NOTE — Progress Notes (Addendum)
T[facility] DAILY ICU PROGRESS NOTE                   [address]            [place] [address]          [phone]   4 Days Post-Op Procedure(s) (LRB): REMOVAL OF IMPELLA 5.5 direct, LEFT VENTRICULAR ASSIST DEVICE.with CELL SAVER, TEE (N/A) TRANSESOPHAGEAL ECHOCARDIOGRAM (TEE) (N/A) Bronchial Washings (N/A)  Total Length of Stay:  LOS: 15 days   Subjective: Patient sedated, on vent. Nurse states sedation being lightened but patient not awake yet  Objective: Vital signs in last 24 hours: Temp:  [95.9 F (35.5 C)-97.9 F (36.6 C)] 95.9 F (35.5 C) (04/13 0700) Pulse Rate:  [95] 95 (04/13 0700) Cardiac Rhythm: Atrial paced (04/13 0400) Resp:  [12-38] 20 (04/13 0700) BP: (82-109)/(52-72) 89/64 (04/13 0700) SpO2:  [98 %-100 %] 100 % (04/13 0700) Arterial Line BP: (86-123)/(37-56) 105/51 (04/13 0700) FiO2 (%):  [40 %-60 %] 40 % (04/13 0344) Weight:  [68.1 kg] 68.1 kg (04/13 0500)  Filed Weights   04/27/19 0500 04/28/19 0500 04/29/19 0500  Weight: 77 kg 71.2 kg 68.1 kg    Weight change: -3.1 kg   Hemodynamic parameters for last 24 hours: PAP: (23-25)/(16-23) 23/20 CVP:  [6 mmHg-12 mmHg] 8 mmHg CO:  [5.1 L/min-5.5 L/min] 5.1 L/min CI:  [2.7 L/min/m2-2.9 L/min/m2] 2.7 L/min/m2  Intake/Output from previous day: 04/12 0701 - 04/13 0700 In: 3345.5 [I.V.:1945.5; NG/GT:950; IV Piggyback:450] Out: 6466 [Urine:20; Chest Tube:330]  Intake/Output this shift: No intake/output data recorded.  Current Meds: Scheduled Meds: . acetylcysteine  2 mL Nebulization TID  . aspirin EC  325 mg Oral Daily   Or  . aspirin  324 mg Per Tube Daily  . bisacodyl  10 mg Oral Daily   Or  . bisacodyl  10 mg Rectal Daily  . chlorhexidine gluconate (MEDLINE KIT)  15 mL Mouth Rinse BID  . Chlorhexidine Gluconate Cloth  6 each Topical Daily  . docusate  200 mg Per Tube Daily  . feeding supplement (ENSURE ENLIVE)  237 mL Oral TID BM  . feeding supplement (PRO-STAT SUGAR FREE 64)  30  mL Per Tube Daily  . gabapentin  300 mg Per Tube Q8H  . insulin aspart  0-24 Units Subcutaneous Q4H  . insulin glargine  8 Units Subcutaneous BID  . mouth rinse  15 mL Mouth Rinse 10 times per day  . pantoprazole sodium  40 mg Per Tube Daily  . pneumococcal 23 valent vaccine  0.5 mL Intramuscular Tomorrow-1000  . polyethylene glycol  17 g Oral Daily  . rosuvastatin  10 mg Per Tube q1800  . sodium chloride flush  10-40 mL Intracatheter Q12H   Continuous Infusions: .  prismasol BGK 4/2.5 400 mL/hr at 04/29/19 0548  .  prismasol BGK 4/2.5 200 mL/hr at 04/29/19 0603  . sodium chloride 10 mL/hr at 04/28/19 1900  . sodium chloride    . sodium chloride 10 mL/hr at 05/03/2019 1800  . sodium chloride 20 mL/hr at 05/09/2019 1800  . sodium chloride    . amiodarone 30 mg/hr (04/29/19 0404)  . ceFEPime (MAXIPIME) IV 2 g (04/28/19 2116)  . dexmedetomidine 0.7 mcg/kg/hr (04/29/19 0407)  . epinephrine Stopped (04/28/19 0138)  . feeding supplement (VITAL 1.5 CAL) 1,000 mL (04/28/19 1604)  . fentaNYL infusion INTRAVENOUS Stopped (04/28/19 1127)  . fluconazole (DIFLUCAN) IV 200 mg (04/28/19 2203)  . heparin 10,000 units/ 20 mL infusion  syringe 1,000 Units/hr (04/29/19 0406)  . lactated ringers Stopped (04/30/2019 1640)  . lactated ringers 10 mL/hr at 04/28/19 1900  . norepinephrine (LEVOPHED) Adult infusion 26 mcg/min (04/28/19 2120)  . prismasol BGK 4/2.5 1,800 mL/hr at 04/29/19 0706  . vancomycin Stopped (04/29/19 4967)  . vasopressin (PITRESSIN) infusion - *FOR SHOCK* 0.03 Units/min (04/29/19 0406)   PRN Meds:.sodium chloride, Place/Maintain arterial line **AND** sodium chloride, alteplase, fentaNYL, fentaNYL (SUBLIMAZE) injection, fentaNYL (SUBLIMAZE) injection, heparin, levalbuterol, metoprolol tartrate, midazolam, ondansetron (ZOFRAN) IV, ondansetron (ZOFRAN) IV, sodium chloride, sodium chloride flush  Heart: Paced Lungs: Slightly diminished basilar breath sounds but mostly clear Abdomen: Soft,  non tender, rare bowel sounds this am Extremities: [employer], SCDs in place. Trace LE edema Wound: Sternal wound is clean and dry.   Lab Results: CBC: Recent Labs    04/28/19 0411 04/28/19 0419 04/29/19 0444 04/29/19 0656  WBC 13.7*  --  10.9*  --   HGB 9.4*   < > 9.2* 10.2*  HCT 28.4*   < > 27.6* 30.0*  PLT 132*  --  170  --    < > = values in this interval not displayed.   BMET:  Recent Labs    04/28/19 1622 04/28/19 1622 04/29/19 0444 04/29/19 0656  NA 137   < > 135 136  K 4.9   < > 5.0 4.8  CL 103  --  102  --   CO2 24  --  21*  --   GLUCOSE 227*  --  167*  --   BUN 27*  --  27*  --   CREATININE 1.65*  --  1.63*  --   CALCIUM 7.0*  --  7.2*  --    < > = values in this interval not displayed.    CMET: Lab Results  Component Value Date   WBC 10.9 (H) 04/29/2019   HGB 10.2 (L) 04/29/2019   HCT 30.0 (L) 04/29/2019   PLT 170 04/29/2019   GLUCOSE 167 (H) 04/29/2019   CHOL 174 08/21/2017   TRIG 143 08/21/2017   HDL 57 08/21/2017   LDLCALC 88 08/21/2017   ALT 242 (H) 04/29/2019   AST 167 (H) 04/29/2019   NA 136 04/29/2019   K 4.8 04/29/2019   CL 102 04/29/2019   CREATININE 1.63 (H) 04/29/2019   BUN 27 (H) 04/29/2019   CO2 21 (L) 04/29/2019   TSH 0.925 04/02/2019   INR 1.6 (H) 05/14/2019   HGBA1C 9.0 (H) 04/01/2019   MICROALBUR 30 08/21/2017    PT/INR:  No results for input(s): LABPROT, INR in the last 72 hours. Radiology: No results found.  Assessment/Plan: S/P Procedure(s) (LRB): REMOVAL OF IMPELLA 5.5 direct, LEFT VENTRICULAR ASSIST DEVICE.with CELL SAVER, TEE (N/A) TRANSESOPHAGEAL ECHOCARDIOGRAM (TEE) (N/A) Bronchial Washings (N/A)  1. CV- Paced. S/p removal of Impella on 04/09. CO/CI 4.5/2.4. On Amiodarone 30 mg/hr, Milrinone 0.25 mcg/kg/min, Vaso pressin 0.03 units/min, and Nor epi 19 mcg/min drips. Weaning drips as able.  Co ox this am decreased to 59.8 2. Pulmonary-On vent with FiO2% 40.Marland Kitchen Chest tubes with 330 cc of output last 24 hours. CXR  this am appears stable. 3. Expected post op blood loss anemia-H and H this am stable at 9.2 and 27.6 4. DM-CBGs 178/170/147. On Insulin. He was on Insulin and Metformin 1000 mg bid prior to surgery. Will continue on Insulin for now as creatinine elevated and NPO. Pre op HGA1C 9. He will need close medical follow up after discharge 5. AKI-Creatinine remains  1.63 6. Thrombocytopenia-platelets increased to 115,000 7. Acute systolic heart failure-Previously on Lasix drip. CVP 16 this am 8. GI-severe malnutrition of chronic illness. Nutrition recommended Cortrak, TFs 9. Elevated transaminases likely related to shock-AST 167, ALT 242, ALk phos 499 10. ID-on Cefipime, Vancomycin, and Fluconazole. Enterococcus Faecalis in pleural fluid. He also has Candida in urine.  [name] PA-C 04/29/2019 7:34 AM   Chest x-ray improved today after removal of fluid with a CVVH Patient remains in sinus rhythm with stable hemodynamics, cardiac index 2.5  Plan-remove Swan, wean ventilator, replace groin temporary dialysis with left subclavian vein HD catheter, remove left chest tube.  Continue antibiotics for Enterococcus grown out of bronchial washings and Candida from urine.  Continue tube feeds-nutrition.  patient examined and medical record reviewed,agree with above note. [name] 04/29/2019

## 2019-04-29 NOTE — Anesthesia Postprocedure Evaluation (Signed)
Anesthesia Post Note  Patient: Brendan [name]  Procedure(s) Performed: REMOVAL OF IMPELLA 5.5 direct, LEFT VENTRICULAR ASSIST DEVICE.with CELL SAVER, TEE (N/A Chest) TRANSESOPHAGEAL ECHOCARDIOGRAM (TEE) (N/A ) Bronchial Washings (N/A Chest)     Patient location during evaluation: SICU Anesthesia Type: General Level of consciousness: awake and awake and alert Pain management: pain level controlled Vital Signs Assessment: post-procedure vital signs reviewed and stable Respiratory status: nonlabored ventilation, spontaneous breathing and patient connected to nasal cannula oxygen Cardiovascular status: blood pressure returned to baseline Anesthetic complications: no    Last Vitals:  Vitals:   04/29/19 0800 04/29/19 0822  BP:    Pulse: 95   Resp: 15   Temp: (!) 36 C   SpO2: 100% 100%    Last Pain:  Vitals:   04/27/19 1600  TempSrc: Axillary  PainSc: Asleep                 Jaydence Arnesen COKER

## 2019-04-29 NOTE — Progress Notes (Signed)
Nutrition Follow-up  DOCUMENTATION CODES:   Severe malnutrition in context of chronic illness  INTERVENTION:   Tube Feeding:  Change to Vital AF 1.2 at 50 ml/hr Pro-Stat 30 mL BID Provides 1640 kcals, 120 g of protein and 972 mL of free water  Add B-complex with C to replace losses while on CRRT   NUTRITION DIAGNOSIS:   Severe Malnutrition related to chronic illness as evidenced by severe muscle depletion, severe fat depletion.  Being addressed via TF   GOAL:   Patient will meet greater than or equal to 90% of their needs  Progressing  MONITOR:   Vent status, Diet advancement, Labs, Weight trends, TF tolerance  REASON FOR ASSESSMENT:   Consult Poor PO, Assessment of nutrition requirement/status  ASSESSMENT:   64 yo male admitted with acute systeolic CHF, CAD with 3 vessel disease with plan for CABG. PMH includes DM with HgbA1c 9 with neuropathy, HTN  3/30 Cardiac Cath with severe 3-vessel CAD 3/31 ECHO: EF 30-35% 4/01 IABP placed, Intubated 4/02 Impella placed, bilateral CT placed for pleural effusions 4/04 Extubated 4/06 CABG, MV repair, Intubated 4/07 Extubated 4/09 Impella removed, LVAD placed, Cortrak placed, TF initiated 4/10 CRRT initiated 4/12 Re-intubated  Patient is currently intubated on ventilator support, requiring levophed and vasopressin, starting on milrinone MV: 8.8 L/min Temp (24hrs), Avg:97.3 F (36.3 C), Min:95.9 F (35.5 C), Max:98.8 F (37.1 C)  Propofol: none  Vital 1.5 infusing at 40 ml/hr, increased to 50 ml/hr today; Pro-Stat 30 mL daily  Current weight 68.1 kg; admit weight 68.2 kg. Net negative 1.9 L   No recent BM; noted miralax daily yesterday, colace daily on 4/10.   Labs: phosphorus 1.6 (L), ionized calcium 1.07 (L), Creatinine 1.63, BUN 27 Meds: ss novolog, lantus   Diet Order:   Diet Order            Diet NPO time specified  Diet effective now              EDUCATION NEEDS:   Education needs have been  addressed  Skin:  Skin Assessment: Reviewed RN Assessment  Last BM:  4/9  Height:   Ht Readings from Last 1 Encounters:  05/06/2019 5\' 11"  (1.803 m)    Weight:   Wt Readings from Last 1 Encounters:  04/29/19 68.1 kg   BMI:  Body mass index is 20.94 kg/m.  Estimated Nutritional Needs:   Kcal:  1688 kcals  Protein:  100-130 g  Fluid:  >/= 1.5 L   Kerman Passey MS, RDN, LDN, CNSC RD Pager Number and Weekend/On-Call After Hours Pager Located in [place]

## 2019-04-29 NOTE — Procedures (Signed)
Procedure-placement of temporary HD dialysis catheter via left subclavian vein  Indications-removal of groin dialysis catheter to reduce risk of sepsis and to improve patient's mobility during ventilation weaning  Diagnosis-acute renal failure postop, admission with cardiogenic shock, severe coronary disease with ischemic cardiomyopathy and ischemic mitral insufficiency, subsequent CABG x3 with mitral valve repair using perioperative Impella 5.5 LVAD support  Obtained consent was documented and proper timeout performed.  The procedure was done with sterile gown mask and drape. The left chest was prepped draped as a sterile field. Local anesthesia with 1% lidocaine was infiltrated beneath the left clavicle Using Seldinger technique a guidewire was passed into the SVC.  Dilators were placed over the wire. Over the wire of the dialysis catheter was inserted without difficulty flushed of air and saline instilled. Catheter secured to the skin with the sutures provided in the kit and a sterile dressing was applied. Chest x-ray is pending.

## 2019-04-29 NOTE — Progress Notes (Signed)
Patient ID: Brendan [name], male   DOB: Nov 09, 1955, 64 y.o.   MRN: [id number]     Advanced Heart Failure Rounding Note  PCP-Cardiologist: No primary care provider on file.   Subjective:    Events - Presented to [facility] with acute HF  EF 20-25% - Transferred to con - Cath 3/30 with severe 3 vessel disease. EF 25% - Developed PMVT on milrinone -> milrinone stopped -> developed worsening shock -> IABP placed on 4/1 - Clinical deterioration 4/1-> intubated - 4/2 underwent Impella 5.5 placement earlier and bilateral chest tubes with 3L out from each side.  - Extubated 4/4 - 04/26/2019 CABG and MVR - Extubated 4/7 - Back to OR for Impella 5.5 extraction 4/9, extubated.  TEE with EF 35%, RV ok, trivial MR s/p MV repair.  - CVVH started 4/10 with oliguria, rising creatinine and CVP.  - Deteriorating respiratory status, intubated again early am 4/12.  Enterococcus faecalis in sputum.   Intubated on NE 19, vasopressin 0.03, amiodarone 30.  CVP 7 this morning on my measure, CVVH pulling net UF 150 cc/hr. I/Os -3L and 6 lbs down.    He is now on vancomycin/cefepime with E faecalis in sputum, fluconazole with candida in urine.   Echo (4/10): EF 25-30%, normal RV, no significant MR.  I do not think there is an apical thrombus (think trabeculation).   Swan  CVP 7 PA 21/18 Thermo CI 2.4 Co-ox 60%   Objective:   Weight Range: 68.1 kg Body mass index is 20.94 kg/m.   Vital Signs:   Temp:  [95.9 F (35.5 C)-97.9 F (36.6 C)] 96.8 F (36 C) (04/13 0800) Pulse Rate:  [95] 95 (04/13 0800) Resp:  [9-38] 15 (04/13 0800) BP: (82-109)/(48-72) 100/48 (04/13 0743) SpO2:  [99 %-100 %] 100 % (04/13 0822) Arterial Line BP: (28-123)/(24-56) 28/24 (04/13 0800) FiO2 (%):  [40 %-50 %] 40 % (04/13 0743) Weight:  [68.1 kg] 68.1 kg (04/13 0500) Last BM Date: 04/20/19  Weight change: Filed Weights   04/27/19 0500 04/28/19 0500 04/29/19 0500  Weight: 77 kg 71.2 kg 68.1 kg     Intake/Output:   Intake/Output Summary (Last 24 hours) at 04/29/2019 0835 Last data filed at 04/29/2019 0800 Gross per 24 hour  Intake 3359.09 ml  Output 6430 ml  Net -3070.91 ml      Physical Exam   General: Intubated/sedated.  Neck: JVP 10 cm, no thyromegaly or thyroid nodule.  Lungs: Decreased BS at bases.  CV: Nondisplaced PMI.  Heart regular S1/S2, no S3/S4, no murmur.  No peripheral edema.   Abdomen: Soft, nontender, no hepatosplenomegaly, no distention.  Skin: Intact without lesions or rashes.  Neurologic: Sedated on vent. Extremities: No clubbing or cyanosis.  HEENT: Normal.    Telemetry   A-paced in 70s. Personally reviewed  Labs    CBC Recent Labs    04/28/19 0411 04/28/19 0419 04/29/19 0444 04/29/19 0656  WBC 13.7*  --  10.9*  --   HGB 9.4*   < > 9.2* 10.2*  HCT 28.4*   < > 27.6* 30.0*  MCV 87.1  --  85.7  --   PLT 132*  --  170  --    < > = values in this interval not displayed.   Basic Metabolic Panel Recent Labs    04/28/19 0411 04/28/19 0419 04/28/19 1622 04/28/19 1622 04/29/19 0444 04/29/19 0656  NA 137   < > 137   < > 135 136  K 4.9   < >  4.9   < > 5.0 4.8  CL 102   < > 103  --  102  --   CO2 21*   < > 24  --  21*  --   GLUCOSE 161*   < > 227*  --  167*  --   BUN 27*   < > 27*  --  27*  --   CREATININE 1.84*   < > 1.65*  --  1.63*  --   CALCIUM 7.2*   < > 7.0*  --  7.2*  --   MG 2.5*  --   --   --  2.5*  --   PHOS 3.4   < > 2.3*  --  1.6*  --    < > = values in this interval not displayed.   Liver Function Tests Recent Labs    04/28/19 0411 04/28/19 0411 04/28/19 1622 04/29/19 0444  AST 286*  --   --  167*  ALT 236*  --   --  242*  ALKPHOS 429*  --   --  499*  BILITOT 1.1  --   --  1.0  PROT 5.0*  --   --  5.3*  ALBUMIN 1.7*  1.6*   < > 1.5* 1.5*  1.5*   < > = values in this interval not displayed.   No results for input(s): LIPASE, AMYLASE in the last 72 hours. Cardiac Enzymes No results for input(s): CKTOTAL,  CKMB, CKMBINDEX, TROPONINI in the last 72 hours.  BNP: BNP (last 3 results) Recent Labs    03/17/2019 0113  BNP 655.7*    ProBNP (last 3 results) No results for input(s): PROBNP in the last 8760 hours.   D-Dimer No results for input(s): DDIMER in the last 72 hours. Hemoglobin A1C No results for input(s): HGBA1C in the last 72 hours. Fasting Lipid Panel No results for input(s): CHOL, HDL, LDLCALC, TRIG, CHOLHDL, LDLDIRECT in the last 72 hours. Thyroid Function Tests No results for input(s): TSH, T4TOTAL, T3FREE, THYROIDAB in the last 72 hours.  Invalid input(s): FREET3  Other results:   Imaging    No results found.   Medications:     Scheduled Medications: . aspirin EC  325 mg Oral Daily   Or  . aspirin  324 mg Per Tube Daily  . bisacodyl  10 mg Oral Daily   Or  . bisacodyl  10 mg Rectal Daily  . chlorhexidine gluconate (MEDLINE KIT)  15 mL Mouth Rinse BID  . Chlorhexidine Gluconate Cloth  6 each Topical Daily  . docusate  200 mg Per Tube Daily  . feeding supplement (ENSURE ENLIVE)  237 mL Oral TID BM  . feeding supplement (PRO-STAT SUGAR FREE 64)  30 mL Per Tube Daily  . gabapentin  300 mg Per Tube Q8H  . insulin aspart  0-24 Units Subcutaneous Q4H  . insulin glargine  8 Units Subcutaneous BID  . mouth rinse  15 mL Mouth Rinse 10 times per day  . pantoprazole sodium  40 mg Per Tube Daily  . pneumococcal 23 valent vaccine  0.5 mL Intramuscular Tomorrow-1000  . polyethylene glycol  17 g Oral Daily  . rosuvastatin  10 mg Per Tube q1800  . sodium chloride flush  10-40 mL Intracatheter Q12H  . sorbitol  30 mL Per Tube BID    Infusions: .  prismasol BGK 4/2.5 400 mL/hr at 04/29/19 0548  .  prismasol BGK 4/2.5 200 mL/hr at 04/29/19 0603  . sodium chloride  10 mL/hr at 04/29/19 0800  . sodium chloride    . sodium chloride 10 mL/hr at 04/21/2019 1800  . sodium chloride 20 mL/hr at 05/08/2019 1800  . sodium chloride    . amiodarone 30 mg/hr (04/29/19 0800)  .  ceFEPime (MAXIPIME) IV 2 g (04/28/19 2116)  . dexmedetomidine 0.2 mcg/kg/hr (04/29/19 0800)  . epinephrine Stopped (04/28/19 0138)  . feeding supplement (VITAL 1.5 CAL) 1,000 mL (04/28/19 1604)  . fentaNYL infusion INTRAVENOUS Stopped (04/28/19 1127)  . fluconazole (DIFLUCAN) IV Stopped (04/28/19 2309)  . heparin 10,000 units/ 20 mL infusion syringe 1,000 Units/hr (04/29/19 0406)  . lactated ringers Stopped (04/21/2019 1640)  . lactated ringers 10 mL/hr at 04/29/19 0800  . norepinephrine (LEVOPHED) Adult infusion 19 mcg/min (04/29/19 0800)  . prismasol BGK 4/2.5 1,800 mL/hr at 04/29/19 0706  . vancomycin Stopped (04/29/19 3846)  . vasopressin (PITRESSIN) infusion - *FOR SHOCK* 0.03 Units/min (04/29/19 0800)    PRN Medications: sodium chloride, Place/Maintain arterial line **AND** sodium chloride, alteplase, fentaNYL, fentaNYL (SUBLIMAZE) injection, fentaNYL (SUBLIMAZE) injection, heparin, levalbuterol, metoprolol tartrate, midazolam, ondansetron (ZOFRAN) IV, ondansetron (ZOFRAN) IV, sodium chloride, sodium chloride flush     Assessment/Plan    1.Acute systolic HF ->  Cardiogenic Shock  - Due to iCM. EF 20-25% - Impella 5.5 placed on 4/2.   - s/p CABG/MVRepair on 4/6  - Impella out 4/9.  - Echo 4/10 with EF 25-30%, normal RV, no MR.   - Now on CVVH with volume overload/progressive renal failure.  - CI 2.4 from [place] with co-ox 60% on norepinephrine 19 + vasopressin 0.03.  Would see if we can stop vasopression today, then slow wean of NE as above.    - Volume status improved, CVP now 7.  Weight down 6 lbs last 24 hrs.  Think we can slow CVVH rate with normal CVP, decrease to net UF 50 cc/hr.  - He has PICC, should be able to remove Swan.   2. CAD - LHC with severe 3 vessel CAD  - s/p CABG x 3 MV Repair 4/6  - on ASA/Crestor  3. Acute hypoxic respiratory failure - Extubated 4/9.  - CXR with extensive pulmonary edema and ?PNA component still, re-intubated on 4/12.   - CVP now  down considerably with CVVH, vent weaning today.     4. Mitral Regurgitation - Mod-severe on ECHO - s/p MVrepair, TEE 4/9 with minimal MR s/p repair.   - Echo 4/10 with no significant MR.   5. Uncontrolled DM - Hgb A1C 9.  - On insulin and sliding scale.   6. AKI - due to shock - Became oliguric and now on CVVH to control volume overload.  Renal following.   7. Acute blood loss Anemia  -transfuse as need to keep Hgb > 7.5. Stable.   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   - Keep  Mag >2   9. 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 No sensation R and L foot and occasionaly in his hands. Requires assistance with ADLs.   10. Severe Malnutrition - Prealbumin 8.9  - Nutrition on board - Continue TFs  11. ID - Enterococcus faecalis in sputum => vancomycin/cefepime.  - Candida in urine => fluconazole.   12. Elevated LFTs - Suspect shock liver, trended down but stable today.   CRITICAL CARE Performed by: Loralie Champagne  Total critical care time:40 minutes  Critical care time was exclusive of separately billable procedures and treating other patients.  Critical care was necessary to treat or prevent imminent or life-threatening deterioration.  Critical care was time spent personally by me (independent of midlevel providers or residents) on the following activities: development of treatment plan with patient and/or surrogate as well as nursing, discussions with consultants, evaluation of patient's response to treatment, examination of patient, obtaining history from patient or surrogate, ordering and performing treatments and interventions, ordering and review of laboratory studies, ordering and review of radiographic studies, pulse oximetry and re-evaluation of patient's condition.   Length of Stay: [place], MD  04/29/2019, 8:35 AM  Advanced Heart Failure Team Pager [phone] (M-F; 7a - 4p)  Please contact [facility] for  night-coverage after hours (4p -7a ) and weekends on [email or web address]

## 2019-04-29 NOTE — Progress Notes (Signed)
PULMONARY / CRITCAL CARE MEDICINE   NAME:  Brendan [name], MRN:  [id number], DOB:  12/12/1955, LOS: 5 ADMISSION DATE:  03/23/2019, CONSULTATION DATE:  04/27/2019 REFERRING MD:  Dr. Haroldine Laws, CHIEF COMPLAINT:  Respiratory distress  BRIEF HISTORY:    29 yoM originally presented with SOB and fatigue at [facility] found to have new HFrEF,  NSTEMI, and bilateral pleural effusions transferred to [facility] on 3/29 for further cardiac evaluation.  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  Started on lasix and milrinone gtts however developed NSVT.  Was taken 4/1 for placement of IABP and swan for optimization prior to CABG.  Was on precedex for agitation/ confusion, some concern for DTs.  On the evening of 4/1, patient developed worsening respiratory distress and hypoxia, PCCM consulted for intubation and vent management.    He was successfully extubated on 4/4 and PCCM signed off.  Impella was removed 4/11 and patient noted to have increased WOB throughout the day he was placed on Bipap, but mental status continued to worsen and PCCM consulted for possible re-intubation. HISTORY OF PRESENT ILLNESS   Brendan [name] is a 64 yo male with poorly controlled diabetes complicated by neuropathy and hypertension who presented to an outside hospital on 3/26 with a chief complaint of general malaise and shortness of breath for the past 4 days.  He is now transferred to [facility] for evaluation of troponin elevation, evaluation of newly depressed EF, and consideration for coronary angiography.  History obtained from patient report as well as discharge paperwork from outside hospital.  Briefly, patient reportedly had nausea, vomiting, diarrhea approximately 1 week and is confined to his bed.  He finally presented to the outside hospital ED he was found to have a blood sugar of 516, BUN of 53, creatinine 1.2, and anion gap of 17.  Patient's BNP was elevated to 8900.  Troponin was elevated to 13.1 with changes noted by the team of their  lateral leads as well as V1 and V2.  Patient was started on aspirin and a heparin drip.  Chest x-ray on admission was concerning for bilateral pulmonary opacities differential including infection and pulmonary edema.  So he was started on empiric ceftriaxone and doxycycline, although his procalcitonin was negative.  He had a chest wall echo that was performed that showed an EF of 25% was diuresed during his admission with improvement in his shortness of breath as well as radiographically.  On discussion with patient he has been chest pain-free since his admission to the outside hospital. In addition, he feels that his shortness of breath is much improved.the   Patient had left heart cath on 03/27/2019 showing severe three-vessel disease. 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  He then developed worsening cardiogenic shock and was intubated on 4/1 and placed on a IABP on 4/02. He also had bilateral chest tubes placed with 3 L removed from each side.  He was then extubated on 4/4.  On 4/6 patient had CABG/MVR performed and was extubated on 4/7 with improved mentation with weaning of sedation and pressors.  Patient had Impella removed on 4/9 and subsequent TEE showing an EF of 35% with trivial MR s/p MV repair. Remained on BiPAP at this time. On 4/10 nephrology was consulted due to anuric since CABG and started on CRRT.  Developing worsening shortness of breath and increased work of breathing on BiPAP with hypoxia on ABG and subsequently reintubated on 4/12.  SIGNIFICANT PAST MEDICAL HISTORY  Tobacco abuse, HTN, poorly controlled diabetes, diabetic neuropathy   SIGNIFICANT EVENTS:  3/30 Lt heart cath/TEE performed ( 4/1 Intubated, RHC/IABP/PA cath 4/2 Impella placement 4/3 febrile over evening and night. Blood cultures obtained. Vanc/cefepime started. Hematuria--heparin held. 4/4 Extubated, abx transitioned to rocephin. Heparin restarted 4/6  Re-intubated for CABG/MVR 4/7 Extubated from CABG to BiPAP 4/9 Impella removed  4/9 TEE 4/10 CVC inserted & CRRT initiated  4/12 PCCM re-consulted re-intubation   STUDIES:   3/30 R/ LHC >>  Ost LM to Mid LM lesion is 35% stenosed.  Prox LAD lesion is 90% stenosed.  Prox LAD to Mid LAD lesion is 50% stenosed.  Mid Cx lesion is 100% stenosed.  Prox RCA to Mid RCA lesion is 90% stenosed.  RPDA lesion is 95% stenosed.  LV end diastolic pressure is severely elevated.  Hemodynamic findings consistent with moderate pulmonary hypertension. 1. Severe 3 vessel obstructive CAD 2. High LV filling pressures 3. Reduced cardiac output with index 2.3 4. Moderate pulmonary HTN.  3/30 TTE >> 1. Left ventricular ejection fraction, by estimation, is 30 to 35%. The left ventricle has moderately decreased function. The left ventricle demonstrates regional wall motion abnormalities.There is mild left ventricular hypertrophy. Left ventricular diastolic function could not be evaluated. There is severe hypokinesis of the left ventricular, entire inferior wall, inferoseptal wall, apical segment and lateral wall.  2. Right ventricular systolic function is hyperdynamic. The right ventricular size is normal. There is moderately elevated pulmonary artery systolic pressure.  3. Decreased posterior leaflet motion due to ischemic tethering of the mitral valve leaflets.  4. The mitral valve is abnormal. Moderate to severe mitral valve regurgitation.  5. The aortic valve is tricuspid. Aortic valve regurgitation is not visualized. Mild aortic valve sclerosis is present, with no evidence of aortic valve stenosis.  6. The inferior vena cava is normal in size with greater than 50% respiratory variability, suggesting right atrial pressure of 3 mmHg.   4/1 CT chest w/o >> Large bilateral pleural effusions with associated atelectasis. Moderate to severe bilateral ground-glass opacities, likely reflecting pneumonia  and/or edema.  4/1 RHC  >> RA = 18 RV = 60/20  PA = 63/29 (42) PCW = 33 Fick cardiac output/index = 3.7/1.9 PVR = 2.5 WU FA sat = 98% PA sat = 47%, 49% PaPi = 2.2  4/2 CXR>bilateral chest tubes present. Significantly improved since morning xray. Infiltrate vs atelectasis at right base.  04/28/19 CXR>>vascular congestion and extensive bilateral airspace disease, probably reflecting edema  04/29/19 CXR>> pending  CULTURES:  3/30 MRSA PCR >> neg 4/1 MRSA PCR >> neg 4/2 resp culture>>rare gram pos cocci, few candida albicans 4/3 Urine culture>>negative 4/4 blood cultures>>no growth  4/9 Surgical wound>> rare enterococcus faecalis 4/9 BCx2>> 4/9 Urine Culture>> greater than 100k yeast  ANTIBIOTICS:  PTA ceftriaxone/ doxycycline >> 3/29 Ceftriaxone 4/4-4/5 Cefepime 4/4>>4/11 vanc 4/4>>4/6 Rocephin 4/4>>4/5 Cefuroxime 4/6-4/7 Ampicillin 4/11 Vancomycin 4/12>> Cefepime 4/12>>  LINES/TUBES:  4/12 ET tube 4/9 Cortrak 4/6 Rt IJ 4/10 HD cath 4/12 foley cath 3/30 PICC  CONSULTANTS:  TCTS PCCM HF  SUBJECTIVE:  Patient sedated and unresponsive. He is resting comfortably in bed.   CONSTITUTIONAL: BP 93/64   Pulse 95   Temp (!) 97.5 F (36.4 C)   Resp (!) 38   Ht 5\' 11"  (1.803 m) Comment: measured x 3  Wt 71.2 kg   SpO2 100%   BMI 21.89 kg/m   I/O last 3 completed shifts: In: 4632 [I.V.:2741.9; NG/GT:1190; IV Piggyback:700] Out: 2751 [Urine:35;  HUDJS:9702; Chest Tube:560]  PAP: (23-32)/(19-25) 25/23 CVP:  [6 mmHg-15 mmHg] 11 mmHg CO:  [5.4 L/min-6 L/min] 5.4 L/min CI:  [2.9 L/min/m2-3.2 L/min/m2] 2.9 L/min/m2  Vent Mode: PRVC FiO2 (%):  [40 %-60 %] 40 % Set Rate:  [24 bmp] 24 bmp Vt Set:  [450 mL] 450 mL PEEP:  [10 cmH20] 10 cmH20 Plateau Pressure:  [18 cmH20-26 cmH20] 18 cmH20   PHYSICAL EXAM: Physical Exam  Constitutional: No distress.  HENT:  Head: Normocephalic and atraumatic.  Mouth/Throat: Oropharynx is clear and moist.  Eyes:   Patient sedated with mixed pupils bilaterally. No discharge noted.  Neck: No tracheal deviation present.  Cardiovascular: Normal rate and intact distal pulses.  Murmur heard. Pulmonary/Chest: He has rales (bilaterally).  On ventilator.   Abdominal: Soft. He exhibits no distension.  Musculoskeletal:        General: Edema (1-2+ pitting edema) present.  Neurological:  Sedated/unresp(onsive, mixed pupils.   Skin: Skin is warm and dry.     RESOLVED PROBLEM LIST   ASSESSMENT AND PLAN    Acute hypoxic respiratory failure Patient's respiratory failure is likely multifactorial secondary to cardiogenic shock, HCAP and ARDS. - He was reintubated yesterday and currently on broad spectrum AB with cefepime and vancomycin -SAT/SBT as tolerated with possible need for tracheostomy. -Titrate vent settings to maintain SPO2 greater than or equal to 90% -HOB elevated to 30 degrees -Continue to maintain plateau pressures less than 30 cm H2O -Continue bronchial hygiene and RT/bronchodilator protocol -Continue broad-spectrum antibiotics with Vanco/cefepime - Blood cultures show no growth at 3 days, Respiratory cultures pending  - CXR pending - Precedex at 0.7 mcg/kg/hr  HFrEF, cardiogenic shock, NSTEMI with three-vessel disease, NSVT Status post IABP, Impella, left heart cath, and CABG x3 on amiodarone gtt. for polymorphic ventricular tachycardia. -Management per heart failure team and cardiothoracic surgery -Continue amiodarone gtt. -Continue Levophed and vasopressin gtt. (Vaso at 0.03 units/min and Levophed at 26 mcg/min) - Net output of - 2,822, with only 20 cc from urine.   Bilateral pleural effusions Patient has bilateral chest tubes placed with output of 80 cc with 780 cc total. -Continue suction  Yeast colonization of respiratory and urine cultures - Continue fluconazole  AKI Mild improvement of his CR today at 1.65. -Continue CRRT per nephrology  Diabetes mellitus -SSI -Continue  Lantus 8 units twice daily  SUMMARY OF TODAY'S PLAN:  Continue fluid removal via CRRT. Consider tracheostomy today.   Best Practice / Goals of Care / Disposition.   Diet: npo. Pain/Anxiety/Delirium protocol (if indicated): Fentanyl VAP protocol (if indicated): HOB 30 degrees, daily SBT DVT prophylaxis: heparin  GI prophylaxis: PPI Glucose control: SSI Mobility: BR Code Status: Full  Family Communication: will reach out to family today. Disposition: ICU   LABS  Glucose Recent Labs  Lab 04/28/19 0417 04/28/19 0752 04/28/19 1159 04/28/19 1523 04/28/19 2000 04/29/19 0024  GLUCAP 150* 137* 120* 202* 178* 170*    BMET Recent Labs  Lab 04/27/19 1535 04/27/19 1536 04/28/19 0411 04/28/19 0419 04/28/19 0735 04/28/19 1526 04/28/19 1622  NA 137   < > 137   < > 138 137 137  K 4.7   < > 4.9   < > 4.4 4.7 4.9  CL 104  --  102  --   --   --  103  CO2 23  --  21*  --   --   --  24  BUN 34*  --  27*  --   --   --  27*  CREATININE 1.94*  --  1.84*  --   --   --  1.65*  GLUCOSE 181*  --  161*  --   --   --  227*   < > = values in this interval not displayed.    Liver Enzymes Recent Labs  Lab 04/26/19 0315 04/26/19 1530 04/27/19 0425 04/27/19 0429 04/27/19 1535 04/28/19 0411 04/28/19 1622  AST 566*  --  295*  --   --  286*  --   ALT 194*  --  195*  --   --  236*  --   ALKPHOS 328*  --  405*  --   --  429*  --   BILITOT 1.2  --  1.0  --   --  1.1  --   ALBUMIN 1.9*   < > 1.7*   < > 1.7* 1.7*  1.6* 1.5*   < > = values in this interval not displayed.    Electrolytes Recent Labs  Lab 05/04/2019 0316 04/26/19 0315 04/27/19 0425 04/27/19 0429 04/27/19 1535 04/28/19 0411 04/28/19 1622  CALCIUM 7.9*   < >  --    < > 7.1* 7.2* 7.0*  MG 2.5*  --  2.4  --   --  2.5*  --   PHOS 4.0   < >  --    < > 2.5 3.4 2.3*   < > = values in this interval not displayed.    CBC Recent Labs  Lab 04/27/19 0425 04/27/19 0437 04/27/19 0944 04/27/19 1536 04/28/19 0411  04/28/19 0411 04/28/19 0419 04/28/19 0735 04/28/19 1526  WBC 18.6*  --  16.2*  --  13.7*  --   --   --   --   HGB 9.9*   < > 9.5*   < > 9.4*   < > 10.2* 9.5* 9.5*  HCT 29.4*   < > 28.0*   < > 28.4*   < > 30.0* 28.0* 28.0*  PLT 212  --  131*  --  132*  --   --   --   --    < > = values in this interval not displayed.    ABG Recent Labs  Lab 04/28/19 0419 04/28/19 0735 04/28/19 1526  PHART 7.264* 7.323* 7.405  PCO2ART 54.6* 43.5 40.0  PO2ART 132.0* 134.0* 116.0*    Coag's Recent Labs  Lab 05/08/2019 1642 04/23/19 1033 04/23/2019 0316 04/27/19 0425 04/28/19 0411  APTT 41*   < > 49* 41* 53*  INR 1.6*  --   --   --   --    < > = values in this interval not displayed.    Sepsis Markers Recent Labs  Lab 04/27/19 0906  PROCALCITON 6.93    Cardiac Enzymes No results for input(s): TROPONINI, PROBNP in the last 168 hours.  PAST MEDICAL HISTORY :   He  has a past medical history of Cholecystitis (05/7320), Complication of anesthesia, Diabetes mellitus without complication ([place]), Erectile dysfunction (2010), HTN (hypertension), NSTEMI (non-ST elevated myocardial infarction) ([place]) (04/06/2019), and PONV (postoperative nausea and vomiting).  PAST SURGICAL HISTORY:  He  has a past surgical history that includes Appendectomy (1969); Finger surgery (Left, 2004); Cholecystectomy; ERCP (N/A, 04/02/2013); Cholecystectomy (N/A, 04/02/2013); RIGHT/LEFT HEART CATH AND CORONARY ANGIOGRAPHY (N/A, 04/13/2019); RIGHT HEART CATH (N/A, 04/27/2019); IABP Insertion (N/A, 04/21/2019); Placement of impella left ventricular assist device (Right, 05/14/2019); TEE without cardioversion (N/A, 05/07/2019); Coronary artery bypass graft (N/A, 04/20/2019); Mitral valve replacement (N/A, 04/21/2019); Removal of  impella left ventricular assist device (N/A, 05/13/2019); TEE without cardioversion (N/A, 04/24/2019); and Bronchial washings (N/A, 04/21/2019).  Allergies  Allergen Reactions  . Acetaminophen Itching    No current  facility-administered medications on file prior to encounter.   Current Outpatient Medications on File Prior to Encounter  Medication Sig  . Insulin Glargine (BASAGLAR KWIKPEN) 100 UNIT/ML Inject 28 Units into the skin at bedtime.  . insulin lispro (HUMALOG) 100 UNIT/ML injection Inject 8 Units into the skin 2 (two) times daily before a meal.  . metFORMIN (GLUCOPHAGE) 500 MG tablet Take 1 tablet (500 mg total) by mouth 2 (two) times daily with a meal.  . glucose blood (RELION GLUCOSE TEST STRIPS) test strip Use as instructed  . Insulin Pen Needle (PEN NEEDLES) 32G X 4 MM MISC   . ReliOn Ultra Thin Lancets MISC Use to test blood sugar up to 4 times daily.    FAMILY HISTORY:   His family history includes Colon cancer in his maternal aunt; Diabetes in his maternal grandmother; Stroke in his maternal grandmother.  SOCIAL HISTORY:  He  reports that he has never smoked. His smokeless tobacco use includes snuff and chew. He reports previous alcohol use. He reports that he does not use drugs.  REVIEW OF SYSTEMS:    Negative except for what is noted in subjective.

## 2019-04-30 ENCOUNTER — Inpatient Hospital Stay (HOSPITAL_COMMUNITY): Payer: Medicaid Other

## 2019-04-30 DIAGNOSIS — J690 Pneumonitis due to inhalation of food and vomit: Secondary | ICD-10-CM

## 2019-04-30 LAB — PREPARE RBC (CROSSMATCH)

## 2019-04-30 LAB — POCT I-STAT 7, (LYTES, BLD GAS, ICA,H+H)
Acid-Base Excess: 6 mmol/L — ABNORMAL HIGH (ref 0.0–2.0)
Acid-Base Excess: 3 mmol/L — ABNORMAL HIGH (ref 0.0–2.0)
Acid-Base Excess: 2 mmol/L (ref 0.0–2.0)
Acid-Base Excess: 2 mmol/L (ref 0.0–2.0)
Bicarbonate: 30.2 mmol/L — ABNORMAL HIGH (ref 20.0–28.0)
Bicarbonate: 26.9 mmol/L (ref 20.0–28.0)
Bicarbonate: 25.7 mmol/L (ref 20.0–28.0)
Bicarbonate: 25.7 mmol/L (ref 20.0–28.0)
Calcium, Ion: 1.08 mmol/L — ABNORMAL LOW (ref 1.15–1.40)
Calcium, Ion: 1.1 mmol/L — ABNORMAL LOW (ref 1.15–1.40)
Calcium, Ion: 1.09 mmol/L — ABNORMAL LOW (ref 1.15–1.40)
Calcium, Ion: 1.08 mmol/L — ABNORMAL LOW (ref 1.15–1.40)
HCT: 26 % — ABNORMAL LOW (ref 39.0–52.0)
HCT: 26 % — ABNORMAL LOW (ref 39.0–52.0)
HCT: 28 % — ABNORMAL LOW (ref 39.0–52.0)
HCT: 28 % — ABNORMAL LOW (ref 39.0–52.0)
Hemoglobin: 8.8 g/dL — ABNORMAL LOW (ref 13.0–17.0)
Hemoglobin: 8.8 g/dL — ABNORMAL LOW (ref 13.0–17.0)
Hemoglobin: 9.5 g/dL — ABNORMAL LOW (ref 13.0–17.0)
Hemoglobin: 9.5 g/dL — ABNORMAL LOW (ref 13.0–17.0)
O2 Saturation: 100 %
O2 Saturation: 93 %
O2 Saturation: 92 %
O2 Saturation: 82 %
Patient temperature: 97.8
Patient temperature: 98.5
Patient temperature: 98.5
Patient temperature: 97.8
Potassium: 4.7 mmol/L (ref 3.5–5.1)
Potassium: 4.8 mmol/L (ref 3.5–5.1)
Potassium: 4.3 mmol/L (ref 3.5–5.1)
Potassium: 4.3 mmol/L (ref 3.5–5.1)
Sodium: 138 mmol/L (ref 135–145)
Sodium: 137 mmol/L (ref 135–145)
Sodium: 137 mmol/L (ref 135–145)
Sodium: 137 mmol/L (ref 135–145)
TCO2: 31 mmol/L (ref 22–32)
TCO2: 28 mmol/L (ref 22–32)
TCO2: 27 mmol/L (ref 22–32)
TCO2: 27 mmol/L (ref 22–32)
pCO2 arterial: 38.3 mmHg (ref 32.0–48.0)
pCO2 arterial: 39.1 mmHg (ref 32.0–48.0)
pCO2 arterial: 34.5 mmHg (ref 32.0–48.0)
pCO2 arterial: 33.4 mmHg (ref 32.0–48.0)
pH, Arterial: 7.503 — ABNORMAL HIGH (ref 7.350–7.450)
pH, Arterial: 7.445 (ref 7.350–7.450)
pH, Arterial: 7.48 — ABNORMAL HIGH (ref 7.350–7.450)
pH, Arterial: 7.492 — ABNORMAL HIGH (ref 7.350–7.450)
pO2, Arterial: 160 mmHg — ABNORMAL HIGH (ref 83.0–108.0)
pO2, Arterial: 64 mmHg — ABNORMAL LOW (ref 83.0–108.0)
pO2, Arterial: 59 mmHg — ABNORMAL LOW (ref 83.0–108.0)
pO2, Arterial: 41 mmHg — ABNORMAL LOW (ref 83.0–108.0)

## 2019-04-30 LAB — CULTURE, BLOOD (ROUTINE X 2)
Culture: NO GROWTH
Culture: NO GROWTH
Special Requests: ADEQUATE
Special Requests: ADEQUATE

## 2019-04-30 LAB — RENAL FUNCTION PANEL
Albumin: 1.3 g/dL — ABNORMAL LOW (ref 3.5–5.0)
Albumin: 1.4 g/dL — ABNORMAL LOW (ref 3.5–5.0)
Anion gap: 11 (ref 5–15)
Anion gap: 8 (ref 5–15)
BUN: 28 mg/dL — ABNORMAL HIGH (ref 8–23)
BUN: 27 mg/dL — ABNORMAL HIGH (ref 8–23)
CO2: 23 mmol/L (ref 22–32)
CO2: 25 mmol/L (ref 22–32)
Calcium: 7.3 mg/dL — ABNORMAL LOW (ref 8.9–10.3)
Calcium: 7.3 mg/dL — ABNORMAL LOW (ref 8.9–10.3)
Chloride: 102 mmol/L (ref 98–111)
Chloride: 104 mmol/L (ref 98–111)
Creatinine, Ser: 1.54 mg/dL — ABNORMAL HIGH (ref 0.61–1.24)
Creatinine, Ser: 1.43 mg/dL — ABNORMAL HIGH (ref 0.61–1.24)
GFR calc Af Amer: 56 mL/min — ABNORMAL LOW (ref >60)
GFR calc Af Amer: >60 mL/min (ref >60)
GFR calc non Af Amer: 48 mL/min — ABNORMAL LOW (ref >60)
GFR calc non Af Amer: 52 mL/min — ABNORMAL LOW (ref >60)
Glucose, Bld: 134 mg/dL — ABNORMAL HIGH (ref 70–99)
Glucose, Bld: 131 mg/dL — ABNORMAL HIGH (ref 70–99)
Phosphorus: 1.5 mg/dL — ABNORMAL LOW (ref 2.5–4.6)
Phosphorus: 1.2 mg/dL — ABNORMAL LOW (ref 2.5–4.6)
Potassium: 4.9 mmol/L (ref 3.5–5.1)
Potassium: 4.4 mmol/L (ref 3.5–5.1)
Sodium: 136 mmol/L (ref 135–145)
Sodium: 137 mmol/L (ref 135–145)

## 2019-04-30 LAB — AEROBIC/ANAEROBIC CULTURE W GRAM STAIN (SURGICAL/DEEP WOUND)

## 2019-04-30 LAB — CBC
HCT: 24.4 % — ABNORMAL LOW (ref 39.0–52.0)
Hemoglobin: 7.9 g/dL — ABNORMAL LOW (ref 13.0–17.0)
MCH: 27.9 pg (ref 26.0–34.0)
MCHC: 32.4 g/dL (ref 30.0–36.0)
MCV: 86.2 fL (ref 80.0–100.0)
Platelets: 211 10*3/uL (ref 150–400)
RBC: 2.83 MIL/uL — ABNORMAL LOW (ref 4.22–5.81)
RDW: 16.9 % — ABNORMAL HIGH (ref 11.5–15.5)
WBC: 10.8 10*3/uL — ABNORMAL HIGH (ref 4.0–10.5)
nRBC: 0.4 % — ABNORMAL HIGH (ref 0.0–0.2)

## 2019-04-30 LAB — HEPATIC FUNCTION PANEL
ALT: 198 U/L — ABNORMAL HIGH (ref 0–44)
AST: 180 U/L — ABNORMAL HIGH (ref 15–41)
Albumin: 1.4 g/dL — ABNORMAL LOW (ref 3.5–5.0)
Alkaline Phosphatase: 488 U/L — ABNORMAL HIGH (ref 38–126)
Bilirubin, Direct: 0.4 mg/dL — ABNORMAL HIGH (ref 0.0–0.2)
Indirect Bilirubin: 0.6 mg/dL (ref 0.3–0.9)
Total Bilirubin: 1 mg/dL (ref 0.3–1.2)
Total Protein: 5.2 g/dL — ABNORMAL LOW (ref 6.5–8.1)

## 2019-04-30 LAB — GLUCOSE, CAPILLARY
Glucose-Capillary: 193 mg/dL — ABNORMAL HIGH (ref 70–99)
Glucose-Capillary: 137 mg/dL — ABNORMAL HIGH (ref 70–99)
Glucose-Capillary: 139 mg/dL — ABNORMAL HIGH (ref 70–99)
Glucose-Capillary: 142 mg/dL — ABNORMAL HIGH (ref 70–99)
Glucose-Capillary: 119 mg/dL — ABNORMAL HIGH (ref 70–99)
Glucose-Capillary: 188 mg/dL — ABNORMAL HIGH (ref 70–99)
Glucose-Capillary: 166 mg/dL — ABNORMAL HIGH (ref 70–99)

## 2019-04-30 LAB — MAGNESIUM: Magnesium: 2.5 mg/dL — ABNORMAL HIGH (ref 1.7–2.4)

## 2019-04-30 LAB — APTT: aPTT: 39 seconds — ABNORMAL HIGH (ref 24–36)

## 2019-04-30 LAB — COOXEMETRY PANEL
Carboxyhemoglobin: 1.6 % — ABNORMAL HIGH (ref 0.5–1.5)
Methemoglobin: 1.3 % (ref 0.0–1.5)
O2 Saturation: 72.8 %
Total hemoglobin: 8.5 g/dL — ABNORMAL LOW (ref 12.0–16.0)

## 2019-04-30 MED ORDER — AMIODARONE HCL IN DEXTROSE 360-4.14 MG/200ML-% IV SOLN
INTRAVENOUS | Status: AC
  Administered 2019-05-01: 60 mg/h
  Filled 2019-04-30: qty 200

## 2019-04-30 MED ORDER — AMIODARONE HCL 200 MG PO TABS
200.0000 mg | ORAL_TABLET | Freq: Two times a day (BID) | ORAL | Status: DC
  Administered 2019-04-30 (×2): 200 mg
  Filled 2019-04-30 (×2): qty 1

## 2019-04-30 NOTE — Progress Notes (Signed)
5 Days Post-Op Procedure(s) (LRB): REMOVAL OF IMPELLA 5.5 direct, LEFT VENTRICULAR ASSIST DEVICE.with CELL SAVER, TEE (N/A) TRANSESOPHAGEAL ECHOCARDIOGRAM (TEE) (N/A) Bronchial Washings (N/A) Subjective: Patient had stable night CRT effective with 2 pound weight loss Chest x-ray much clear today Patient remains sedated on ventilator but responsive and hope to wean and extubate today. We will place peripheral IV and remove Swan sheath Cardiac output excellent with coox greater than 70% Maintaining sinus rhythm Objective: Vital signs in last 24 hours: Temp:  [97.9 F (36.6 C)-99.8 F (37.7 C)] 98.5 F (36.9 C) (04/14 0805) Pulse Rate:  [95-96] 95 (04/14 0700) Cardiac Rhythm: Atrial paced (04/14 0752) Resp:  [10-34] 17 (04/14 0700) BP: (82-109)/(37-55) 82/51 (04/14 0500) SpO2:  [96 %-100 %] 100 % (04/14 0730) Arterial Line BP: (82-118)/(35-51) 98/42 (04/14 0700) FiO2 (%):  [30 %-40 %] 40 % (04/14 0730) Weight:  [67.8 kg] 67.8 kg (04/14 0200)  Hemodynamic parameters for last 24 hours: CVP:  [7 mmHg-10 mmHg] 8 mmHg  Intake/Output from previous day: 04/13 0701 - 04/14 0700 In: 3340.1 [I.V.:1653.1; NG/GT:1221.7; IV Piggyback:465.4] Out: 3980 [Chest Tube:75] Intake/Output this shift: Total I/O In: 121.5 [I.V.:61.2; NG/GT:50; IV Piggyback:10.3] Out: 182 [Other:182]  Exam Breathing comfortably but responsive on vent Sternal incision clean and dry Few scattered rhonchi Extremities warm with minimal edema No murmur  Lab Results: Recent Labs    04/29/19 0444 04/29/19 0656 04/30/19 0311 04/30/19 0436  WBC 10.9*  --   --  10.8*  HGB 9.2*   < > 8.8* 7.9*  HCT 27.6*   < > 26.0* 24.4*  PLT 170  --   --  211   < > = values in this interval not displayed.   BMET:  Recent Labs    04/29/19 1644 04/29/19 1644 04/30/19 0311 04/30/19 0436  NA 137   < > 138 136  K 5.2*   < > 4.7 4.9  CL 105  --   --  102  CO2 21*  --   --  23  GLUCOSE 178*  --   --  134*  BUN 28*  --   --   28*  CREATININE 1.58*  --   --  1.54*  CALCIUM 7.0*  --   --  7.3*   < > = values in this interval not displayed.    PT/INR: No results for input(s): LABPROT, INR in the last 72 hours. ABG    Component Value Date/Time   PHART 7.503 (H) 04/30/2019 0311   HCO3 30.2 (H) 04/30/2019 0311   TCO2 31 04/30/2019 0311   ACIDBASEDEF 3.0 (H) 04/28/2019 0735   O2SAT 72.8 04/30/2019 0436   CBG (last 3)  Recent Labs    04/29/19 2311 04/30/19 0440 04/30/19 0731  GLUCAP 193* 137* 139*    Assessment/Plan: S/P Procedure(s) (LRB): REMOVAL OF IMPELLA 5.5 direct, LEFT VENTRICULAR ASSIST DEVICE.with CELL SAVER, TEE (N/A) TRANSESOPHAGEAL ECHOCARDIOGRAM (TEE) (N/A) Bronchial Washings (N/A) Ventilator wean and possible extubation today 1 unit of packed cells and wean pressors to maintain systolic pressure greater than 100 Back off on fluid removal with CVP less than 10   LOS: 16 days    [name] 04/30/2019

## 2019-04-30 NOTE — Progress Notes (Signed)
PULMONARY / CRITICAL CARE MEDICINE   NAME:  Brendan [name], MRN:  [id number], DOB:  August 21, 1955, LOS: 66 ADMISSION DATE:  04/06/2019, CONSULTATION DATE:  05/10/2019 REFERRING MD:  Dr. Haroldine Laws, CHIEF COMPLAINT:  Respiratory distress  BRIEF HISTORY:    64 yoM originally presented with SOB and fatigue at [facility] found to have new HFrEF, NSTEMI, and bilateral pleural effusions transferred to [facility] on 3/29 for further cardiac evaluation. 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 Started on lasix and milrinone gtts however developed NSVT. Was taken 4/1 for placement of IABP and swan for optimization prior to CABG. Was on precedex for agitation/ confusion, some concern for DTs. On the evening of 4/1, patient developed worsening respiratory distress and hypoxia, PCCM consulted for intubation and vent management.   He was successfully extubated on 4/4 and PCCM signed off. Impella was removed 4/11 and patient noted to have increased WOB throughout the day he was placed on Bipap, but mental status continued to worsen and PCCM consulted for possible re-intubation. HISTORY OF PRESENT ILLNESS   [name] is a 64 yo male withpoorly controlled diabetes complicated by neuropathy and hypertension who presented to an outside hospital on 3/26 with a chief complaint of general malaise and shortness of breath for the past 4 days. He is now transferred to [facility] for evaluation of troponin elevation, evaluation of newly depressed EF,and consideration for coronary angiography.  History obtained from patient report as well as discharge paperwork from outside hospital. Briefly, patient reportedly had nausea, vomiting, diarrhea approximately 1 week and is confined to his bed. He finally presented to the outside hospital ED he was found to have a blood sugar of 516, BUN of 53, creatinine 1.2, and anion gap of 17. Patient's BNP was elevated to 8900. Troponin was elevated to 13.1 with changes noted by the team of their  lateral leads as well as V1 and V2. Patient was started on aspirin and a heparin drip. Chest x-ray on admission was concerning for bilateral pulmonary opacities differential including infection and pulmonary edema. So he was started on empiric ceftriaxone and doxycycline, although his procalcitonin was negative. He had a chest wall echo that was performed that showed an EF of 25% was diuresed during his admission with improvement in his shortness of breath as well as radiographically. On discussion with patient he has been chest pain-free since his admission to the outside hospital. In addition, he feels that his shortness of breath is much improved.the   Patient had left heart cath on 03/24/2019 showing severe three-vessel disease. 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  He then developed worsening cardiogenic shock and was intubated on 4/1 and placed on a IABP on 4/02. He also had bilateral chest tubes placed with 3 L removed from each side.  He was then extubated on 4/4.  On 4/6 patient had CABG/MVR performed and was extubated on 4/7 with improved mentation with weaning of sedation and pressors.  Patient had Impella removed on 4/9 and subsequent TEE showing an EF of 35% with trivial MR s/p MV repair. Remained on BiPAP at this time. On 4/10 nephrology was consulted due to anuric since CABG and started on CRRT.  Developing worsening shortness of breath and increased work of breathing on BiPAP with hypoxia on ABG and subsequently reintubated on 4/12.  SIGNIFICANT PAST MEDICAL HISTORY   Tobacco abuse, HTN, poorly controlled diabetes, diabetic neuropathy   SIGNIFICANT EVENTS:  3/30 Lt heart cath/TEE performed 4/1 Intubated,  RHC/IABP/PA cath 4/2 Impella placement 4/3 febrile over evening and night. Blood cultures obtained. Vanc/cefepime started. Hematuria--heparin held. 4/4 Extubated, abx transitioned to rocephin. Heparin restarted 4/6  Re-intubated for CABG/MVR 4/7 Extubated from CABG to BiPAP 4/9 Impella removed  4/9 TEE 4/10 CVC inserted & CRRT initiated  4/12 PCCM re-consulted re-intubation  STUDIES:   3/30 R/ LHC >>  Ost LM to Mid LM lesion is 35% stenosed.  Prox LAD lesion is 90% stenosed.  Prox LAD to Mid LAD lesion is 50% stenosed.  Mid Cx lesion is 100% stenosed.  Prox RCA to Mid RCA lesion is 90% stenosed.  RPDA lesion is 95% stenosed.  LV end diastolic pressure is severely elevated.  Hemodynamic findings consistent with moderate pulmonary hypertension. 1. Severe 3 vessel obstructive CAD 2. High LV filling pressures 3. Reduced cardiac output with index 2.3 4. Moderate pulmonary HTN.  3/30 TTE >> 1. Left ventricular ejection fraction, by estimation, is 30 to 35%. The left ventricle has moderately decreased function. The left ventricle demonstrates regional wall motion abnormalities.There is mild left ventricular hypertrophy. Left ventricular diastolic function could not be evaluated. There is severe hypokinesis of the left ventricular, entire inferior wall, inferoseptal wall, apical segment and lateral wall.  2. Right ventricular systolic function is hyperdynamic. The right ventricular size is normal. There is moderately elevated pulmonary artery systolic pressure.  3. Decreased posterior leaflet motion due to ischemic tethering of the mitral valve leaflets.  4. The mitral valve is abnormal. Moderate to severe mitral valve regurgitation.  5. The aortic valve is tricuspid. Aortic valve regurgitation is not visualized. Mild aortic valve sclerosis is present, with no evidence of aortic valve stenosis.  6. The inferior vena cava is normal in size with greater than 50% respiratory variability, suggesting right atrial pressure of 3 mmHg.   4/1 CT chest w/o >>Large bilateral pleural effusions with associated atelectasis.Moderate to severe bilateral ground-glass opacities, likely reflecting pneumonia  and/or edema.  4/1 RHC >> RA = 18 RV = 60/20  PA = 63/29 (42) PCW = 33 Fick cardiac output/index = 3.7/1.9 PVR = 2.5 WU FA sat = 98% PA sat = 47%, 49% PaPi = 2.2  4/2 CXR>bilateral chest tubes present. Significantly improved since morning xray. Infiltrate vs atelectasis at right base.  04/12/21CXR>>vascular congestion and extensive bilateral airspace disease, probably reflecting edema  04/29/19 CXR>>1. New left subclavian dialysis catheter without complicating feature. 2. Interval removal of the left-sided chest tube.  No pneumothorax. 3. Unchanged pulmonary edema.  04/30/2019 CXR>   CULTURES:  3/30 MRSA PCR >> neg 4/1 MRSA PCR >> neg 4/2 resp culture>>rare gram pos cocci, few candida albicans 4/3 Urine culture>>negative 4/4 blood cultures>>no growth  4/9 Surgical wound>> rare enterococcus faecalis 4/9 BCx2>> 4/9 Urine Culture>> greater than 100k yeast  ANTIBIOTICS:  PTA ceftriaxone/ doxycycline >> 3/29 Ceftriaxone 4/4-4/5 Cefepime 4/4>>4/11 vanc 4/4>>4/6 Rocephin 4/4>>4/5 Cefuroxime 4/6-4/7 Ampicillin 4/11 Vancomycin 4/12>> Cefepime 4/12>>  LINES/TUBES:  4/12 ET tube> 4/9 Cortrak> 4/6 Rt IJ> 4/10 HD cath - 4/13 4/12 foley cath> 3/30 PICC> 4/13 HD cath>  CONSULTANTS:  TCTS PCCM HF SUBJECTIVE:  Patient resting comfortably in bed on sedation for MV. Has had some lower blood pressures overnight.   CONSTITUTIONAL: BP (!) 90/53   Pulse 95   Temp 97.9 F (36.6 C) (Oral)   Resp (!) 29   Ht 5\' 11"  (1.803 m) Comment: measured x 3  Wt 67.8 kg   SpO2 100%   BMI 20.85 kg/m   I/O last 3  completed shifts: In: 4899.6 [I.V.:2744.3; NG/GT:1590; IV Piggyback:565.3] Out: 8340 [Urine:20; JASNK:5397; Chest Tube:355]  PAP: (20-25)/(17-22) 20/17 CVP:  [7 mmHg-16 mmHg] 10 mmHg  Vent Mode: PRVC FiO2 (%):  [30 %-40 %] 40 % Set Rate:  [24 bmp] 24 bmp Vt Set:  [450 mL] 450 mL PEEP:  [8 cmH20-10 cmH20] 10 cmH20 Pressure Support:  [6 cmH20-8 cmH20] 6  cmH20 Plateau Pressure:  [15 cmH20] 15 cmH20  PHYSICAL EXAM: Physical Exam  Constitutional: No distress.  HENT:  Head: Normocephalic and atraumatic.  Cardiovascular: Normal rate and intact distal pulses.  Pulmonary/Chest: He has rales.  On MV  Abdominal: Soft. He exhibits no distension.  Musculoskeletal:        General: Edema present.  Neurological:  Sedated  Skin: Skin is warm and dry. He is not diaphoretic.    RESOLVED PROBLEM LIST   ASSESSMENT AND PLAN   Acute hypoxic respiratory failure Patient's respiratory failure is likely multifactorial secondary to cardiogenic shock and HCAP. -SAT/SBT as tolerated with possible need for tracheostomy. -Titrate vent settings to maintain SPO2 greater than or equal to 90% -HOB elevated to 30 degrees -Continue to maintain plateau pressures less than 30 cm H2O -Continue bronchial hygiene and RT/bronchodilator protocol -Continue broad-spectrum antibiotics with Vanco/cefepime - Blood cultures show no growth at 3 days, Respiratory cultures show no growth at < 12 hours  - Precedex at 0.7 mcg/kg/hr - Consider extubation today.  HFrEF, cardiogenic shock, NSTEMI with three-vessel disease, NSVT Status post IABP, Impella, left heart cath, and CABG x3 on amiodarone gtt. for polymorphic ventricular tachycardia. -Management per heart failure team and cardiothoracic surgery -Continue amiodarone gtt. -Continue Levophed and vasopressin gtt. (Vaso at 0.02 units/min and Levophed at 20 mcg/min) - Net output of - 552.8cc, with only 0 cc from urine.   Bilateral pleural effusions Patient has bilateral chest tubes placed with output of 50 cc overnight. -Continue suction  Yeast colonization of respiratory and urine cultures - Continue fluconazole  AKI Stable with Cr of 1.58 on CRRT, Potassium of 5.2.  -Continue CRRT per nephrology  Diabetes mellitus -SSI -Continue Lantus 8 units twice daily  SUMMARY OF TODAY'S PLAN:  Continue fluid removal  via CRRT. Consider extubation today.  Best Practice / Goals of Care / Disposition.   Diet: npo. Pain/Anxiety/Delirium protocol (if indicated):Fentanyl VAP protocol (if indicated):HOB 30 degrees, daily SBT DVT prophylaxis: heparin  GI prophylaxis: PPI Glucose control:SSI Mobility: BR Code Status: Full  Family Communication:will reach out to family today. Disposition: ICU  LABS  Glucose Recent Labs  Lab 04/29/19 0450 04/29/19 0737 04/29/19 1130 04/29/19 1619 04/29/19 1942 04/29/19 2311  GLUCAP 147* 118* 152* 161* 145* 193*    BMET Recent Labs  Lab 04/28/19 1622 04/28/19 1622 04/29/19 0444 04/29/19 0444 04/29/19 0656 04/29/19 1644 04/30/19 0311  NA 137   < > 135   < > 136 137 138  K 4.9   < > 5.0   < > 4.8 5.2* 4.7  CL 103  --  102  --   --  105  --   CO2 24  --  21*  --   --  21*  --   BUN 27*  --  27*  --   --  28*  --   CREATININE 1.65*  --  1.63*  --   --  1.58*  --   GLUCOSE 227*  --  167*  --   --  178*  --    < > = values in this  interval not displayed.    Liver Enzymes Recent Labs  Lab 04/27/19 0425 04/27/19 0429 04/28/19 0411 04/28/19 0411 04/28/19 1622 04/29/19 0444 04/29/19 1644  AST 295*  --  286*  --   --  167*  --   ALT 195*  --  236*  --   --  242*  --   ALKPHOS 405*  --  429*  --   --  499*  --   BILITOT 1.0  --  1.1  --   --  1.0  --   ALBUMIN 1.7*   < > 1.7*  1.6*   < > 1.5* 1.5*  1.5* 1.4*   < > = values in this interval not displayed.    Electrolytes Recent Labs  Lab 04/27/19 0425 04/27/19 0429 04/28/19 0411 04/28/19 0411 04/28/19 1622 04/29/19 0444 04/29/19 1644  CALCIUM  --    < > 7.2*   < > 7.0* 7.2* 7.0*  MG 2.4  --  2.5*  --   --  2.5*  --   PHOS  --    < > 3.4   < > 2.3* 1.6* 1.6*   < > = values in this interval not displayed.    CBC Recent Labs  Lab 04/27/19 0944 04/27/19 1536 04/28/19 0411 04/28/19 0419 04/29/19 0444 04/29/19 0656 04/30/19 0311  WBC 16.2*  --  13.7*  --  10.9*  --   --   HGB  9.5*   < > 9.4*   < > 9.2* 10.2* 8.8*  HCT 28.0*   < > 28.4*   < > 27.6* 30.0* 26.0*  PLT 131*  --  132*  --  170  --   --    < > = values in this interval not displayed.    ABG Recent Labs  Lab 04/28/19 1526 04/29/19 0656 04/30/19 0311  PHART 7.405 7.466* 7.503*  PCO2ART 40.0 36.3 38.3  PO2ART 116.0* 181.0* 160.0*    Coag's Recent Labs  Lab 04/27/19 0425 04/28/19 0411 04/29/19 0444  APTT 41* 53* 42*    Sepsis Markers Recent Labs  Lab 04/27/19 0906  PROCALCITON 6.93    Cardiac Enzymes No results for input(s): TROPONINI, PROBNP in the last 168 hours.  PAST MEDICAL HISTORY :   He  has a past medical history of Cholecystitis (06/9627), Complication of anesthesia, Diabetes mellitus without complication ([place]), Erectile dysfunction (2010), HTN (hypertension), NSTEMI (non-ST elevated myocardial infarction) ([place]) (04/12/2019), and PONV (postoperative nausea and vomiting).  PAST SURGICAL HISTORY:  He  has a past surgical history that includes Appendectomy (1969); Finger surgery (Left, 2004); Cholecystectomy; ERCP (N/A, 04/02/2013); Cholecystectomy (N/A, 04/02/2013); RIGHT/LEFT HEART CATH AND CORONARY ANGIOGRAPHY (N/A, 03/31/2019); RIGHT HEART CATH (N/A, 04/26/2019); IABP Insertion (N/A, 05/01/2019); Placement of impella left ventricular assist device (Right, 04/29/2019); TEE without cardioversion (N/A, 05/07/2019); Coronary artery bypass graft (N/A, 05/04/2019); Mitral valve replacement (N/A, 05/10/2019); Removal of impella left ventricular assist device (N/A, 05/13/2019); TEE without cardioversion (N/A, 04/28/2019); and Bronchial washings (N/A, 04/24/2019).  Allergies  Allergen Reactions  . Acetaminophen Itching    No current facility-administered medications on file prior to encounter.   Current Outpatient Medications on File Prior to Encounter  Medication Sig  . Insulin Glargine (BASAGLAR KWIKPEN) 100 UNIT/ML Inject 28 Units into the skin at bedtime.  . insulin lispro (HUMALOG) 100 UNIT/ML  injection Inject 8 Units into the skin 2 (two) times daily before a meal.  . metFORMIN (GLUCOPHAGE) 500 MG tablet Take 1 tablet (500 mg total)  by mouth 2 (two) times daily with a meal.  . glucose blood (RELION GLUCOSE TEST STRIPS) test strip Use as instructed  . Insulin Pen Needle (PEN NEEDLES) 32G X 4 MM MISC   . ReliOn Ultra Thin Lancets MISC Use to test blood sugar up to 4 times daily.    FAMILY HISTORY:   His family history includes Colon cancer in his maternal aunt; Diabetes in his maternal grandmother; Stroke in his maternal grandmother.  SOCIAL HISTORY:  He  reports that he has never smoked. His smokeless tobacco use includes snuff and chew. He reports previous alcohol use. He reports that he does not use drugs.  REVIEW OF SYSTEMS:    Negative except what is noted in the subjective.

## 2019-04-30 NOTE — Procedures (Signed)
Extubation Procedure Note  Patient Details:   Name: Brendan [name] DOB: March 17, 1955 MRN: [id number]   Airway Documentation:    Vent end date: 04/30/19 Vent end time: 1009   Evaluation  O2 sats: stable throughout Complications: No apparent complications Patient did tolerate procedure well. Bilateral Breath Sounds: Diminished   Yes  6l/min [name]  Revonda Standard 04/30/2019, 10:09 AM

## 2019-04-30 NOTE — Plan of Care (Signed)
  Problem: Coping: Goal: Level of anxiety will decrease Outcome: Not Progressing   Problem: Elimination: Goal: Will not experience complications related to bowel motility Outcome: Not Progressing

## 2019-04-30 NOTE — Progress Notes (Signed)
EVENING ROUNDS NOTE :     [address]       [place] [address]             [phone]                 5 Days Post-Op Procedure(s) (LRB): REMOVAL OF IMPELLA 5.5 direct, LEFT VENTRICULAR ASSIST DEVICE.with CELL SAVER, TEE (N/A) TRANSESOPHAGEAL ECHOCARDIOGRAM (TEE) (N/A) Bronchial Washings (N/A)   Total Length of Stay:  LOS: 16 days  Events:   No events On CRRT -350 today     BP (!) 80/45   Pulse 96   Temp 98.1 F (36.7 C) (Oral)   Resp 15   Ht 5\' 11"  (1.803 m) Comment: measured x 3  Wt 67.8 kg   SpO2 92%   BMI 20.85 kg/m   CVP:  [7 mmHg-11 mmHg] 11 mmHg  Vent Mode: PSV;CPAP FiO2 (%):  [40 %] 40 % Set Rate:  [24 bmp] 24 bmp Vt Set:  [450 mL] 450 mL PEEP:  [5 cmH20-10 cmH20] 5 cmH20 Pressure Support:  [5 cmH20] 5 cmH20 Plateau Pressure:  [15 cmH20] 15 cmH20  .  prismasol BGK 4/2.5 400 mL/hr at 04/30/19 1406  . sodium chloride Stopped (04/29/19 0928)  . sodium chloride    . sodium chloride 1 mL/hr at 04/30/19 1600  . sodium chloride 20 mL/hr at 04/30/19 1600  . sodium chloride    . dexmedetomidine Stopped (04/30/19 0957)  . epinephrine Stopped (04/28/19 0138)  . feeding supplement (VITAL AF 1.2 CAL) 50 mL/hr at 04/30/19 1600  . fentaNYL infusion INTRAVENOUS Stopped (04/29/19 1812)  . heparin 10,000 units/ 20 mL infusion syringe 1,000 Units/hr (04/30/19 0920)  . lactated ringers Stopped (04/29/2019 1640)  . lactated ringers Stopped (04/30/19 0930)  . milrinone 0.125 mcg/kg/min (04/30/19 1700)  . norepinephrine (LEVOPHED) Adult infusion 14 mcg/min (04/30/19 1700)  . prismasol BGK 0/2.5 200 mL/hr at 04/29/19 1930  . prismasol BGK 4/2.5 1,800 mL/hr at 04/30/19 1400  . vasopressin (PITRESSIN) infusion - *FOR SHOCK* 0.01 Units/min (04/30/19 1700)    I/O last 3 completed shifts: In: 5137.4 [I.V.:2620.3; NG/GT:1701.7; IV Piggyback:815.4] Out: 4431 [Urine:15; VQMGQ:6761; Chest Tube:155]   CBC Latest Ref Rng & Units 04/30/2019 04/30/2019 04/30/2019    WBC 4.0 - 10.5 K/uL - - 10.8(H)  Hemoglobin 13.0 - 17.0 g/dL 9.5(L) 8.8(L) 7.9(L)  Hematocrit 39.0 - 52.0 % 28.0(L) 26.0(L) 24.4(L)  Platelets 150 - 400 K/uL - - 211    BMP Latest Ref Rng & Units 04/30/2019 04/30/2019 04/30/2019  Glucose 70 - 99 mg/dL 131(H) - -  BUN 8 - 23 mg/dL 27(H) - -  Creatinine 0.61 - 1.24 mg/dL 1.43(H) - -  Sodium 135 - 145 mmol/L 137 137 137  Potassium 3.5 - 5.1 mmol/L 4.4 4.3 4.8  Chloride 98 - 111 mmol/L 104 - -  CO2 22 - 32 mmol/L 25 - -  Calcium 8.9 - 10.3 mg/dL 7.3(L) - -    ABG    Component Value Date/Time   PHART 7.480 (H) 04/30/2019 1557   PCO2ART 34.5 04/30/2019 1557   PO2ART 59.0 (L) 04/30/2019 1557   HCO3 25.7 04/30/2019 1557   TCO2 27 04/30/2019 1557   ACIDBASEDEF 3.0 (H) 04/28/2019 0735   O2SAT 92.0 04/30/2019 1557       Saket Hellstrom, MD 04/30/2019 5:22 PM

## 2019-04-30 NOTE — Progress Notes (Addendum)
T[facility] DAILY ICU PROGRESS NOTE                   [address]            [place] [id number]          [phone]   5 Days Post-Op Procedure(s) (LRB): REMOVAL OF IMPELLA 5.5 direct, LEFT VENTRICULAR ASSIST DEVICE.with CELL SAVER, TEE (N/A) TRANSESOPHAGEAL ECHOCARDIOGRAM (TEE) (N/A) Bronchial Washings (N/A)  Total Length of Stay:  LOS: 16 days   Subjective: Patient lightly sedated, on vent, on CVVHD  Objective: Vital signs in last 24 hours: Temp:  [97.9 F (36.6 C)-99.8 F (37.7 C)] 98 F (36.7 C) (04/14 0400) Pulse Rate:  [95-96] 95 (04/14 0700) Cardiac Rhythm: Atrial paced (04/14 0400) Resp:  [10-34] 17 (04/14 0700) BP: (81-109)/(37-56) 82/51 (04/14 0500) SpO2:  [96 %-100 %] 100 % (04/14 0700) Arterial Line BP: (82-118)/(35-51) 98/42 (04/14 0700) FiO2 (%):  [30 %-40 %] 40 % (04/14 0311) Weight:  [67.8 kg] 67.8 kg (04/14 0200)  Filed Weights   04/28/19 0500 04/29/19 0500 04/30/19 0200  Weight: 71.2 kg 68.1 kg 67.8 kg    Weight change: -0.3 kg   Hemodynamic parameters for last 24 hours: PAP: (20)/(17) 20/17 CVP:  [7 mmHg-10 mmHg] 8 mmHg  Intake/Output from previous day: 04/13 0701 - 04/14 0700 In: 3340.1 [I.V.:1653.1; NG/GT:1221.7; IV Piggyback:465.4] Out: 3980 [Chest Tube:75]  Intake/Output this shift: No intake/output data recorded.  Current Meds: Scheduled Meds: . aspirin EC  325 mg Oral Daily   Or  . aspirin  324 mg Per Tube Daily  . B-complex with vitamin C  1 tablet Per Tube Daily  . bisacodyl  10 mg Oral Daily   Or  . bisacodyl  10 mg Rectal Daily  . chlorhexidine gluconate (MEDLINE KIT)  15 mL Mouth Rinse BID  . Chlorhexidine Gluconate Cloth  6 each Topical Daily  . docusate  200 mg Per Tube Daily  . feeding supplement (ENSURE ENLIVE)  237 mL Oral TID BM  . feeding supplement (PRO-STAT SUGAR FREE 64)  30 mL Per Tube BID  . gabapentin  300 mg Per Tube Q8H  . insulin aspart  0-24 Units Subcutaneous Q4H  . insulin glargine  8 Units  Subcutaneous BID  . mouth rinse  15 mL Mouth Rinse 10 times per day  . pantoprazole sodium  40 mg Per Tube Daily  . pneumococcal 23 valent vaccine  0.5 mL Intramuscular Tomorrow-1000  . polyethylene glycol  17 g Oral Daily  . rosuvastatin  10 mg Per Tube q1800  . sodium chloride flush  10-40 mL Intracatheter Q12H  . sorbitol  30 mL Per Tube BID   Continuous Infusions: .  prismasol BGK 4/2.5 400 mL/hr at 04/30/19 0111  . sodium chloride Stopped (04/29/19 0928)  . sodium chloride    . sodium chloride 10 mL/hr at 04/23/2019 1800  . sodium chloride 20 mL/hr at 05/01/2019 1800  . sodium chloride    . amiodarone 30 mg/hr (04/30/19 0213)  . dexmedetomidine 0.7 mcg/kg/hr (04/30/19 0076)  . epinephrine Stopped (04/28/19 0138)  . feeding supplement (VITAL AF 1.2 CAL) 1,000 mL (04/29/19 1522)  . fentaNYL infusion INTRAVENOUS Stopped (04/29/19 1812)  . fluconazole (DIFLUCAN) IV 200 mg (04/29/19 2206)  . heparin 10,000 units/ 20 mL infusion syringe 1,000 Units/hr (04/29/19 2303)  . lactated ringers Stopped (04/21/2019 1640)  . lactated ringers 10 mL/hr at 04/29/19 1900  . milrinone 0.125 mcg/kg/min (04/29/19 1900)  .  norepinephrine (LEVOPHED) Adult infusion 26 mcg/min (04/29/19 2207)  . prismasol BGK 0/2.5 200 mL/hr at 04/29/19 1930  . prismasol BGK 4/2.5 1,800 mL/hr at 04/30/19 0620  . vasopressin (PITRESSIN) infusion - *FOR SHOCK* 0.02 Units/min (04/29/19 1900)   PRN Meds:.sodium chloride, Place/Maintain arterial line **AND** sodium chloride, alteplase, fentaNYL, fentaNYL (SUBLIMAZE) injection, fentaNYL (SUBLIMAZE) injection, heparin, levalbuterol, metoprolol tartrate, midazolam, ondansetron (ZOFRAN) IV, ondansetron (ZOFRAN) IV, sodium chloride, sodium chloride flush  Heart: Paced Lungs: Slightly diminished basilar breath sounds but mostly clear Abdomen: Soft, non tender, rare bowel sounds this am Extremities: [employer], SCDs in place. Trace LE edema Wound: Sternal wound is clean and dry.    Lab Results: CBC: Recent Labs    04/29/19 0444 04/29/19 0656 04/30/19 0311 04/30/19 0436  WBC 10.9*  --   --  10.8*  HGB 9.2*   < > 8.8* 7.9*  HCT 27.6*   < > 26.0* 24.4*  PLT 170  --   --  211   < > = values in this interval not displayed.   BMET:  Recent Labs    04/29/19 1644 04/29/19 1644 04/30/19 0311 04/30/19 0436  NA 137   < > 138 136  K 5.2*   < > 4.7 4.9  CL 105  --   --  102  CO2 21*  --   --  23  GLUCOSE 178*  --   --  134*  BUN 28*  --   --  28*  CREATININE 1.58*  --   --  1.54*  CALCIUM 7.0*  --   --  7.3*   < > = values in this interval not displayed.    CMET: Lab Results  Component Value Date   WBC 10.8 (H) 04/30/2019   HGB 7.9 (L) 04/30/2019   HCT 24.4 (L) 04/30/2019   PLT 211 04/30/2019   GLUCOSE 134 (H) 04/30/2019   CHOL 174 08/21/2017   TRIG 143 08/21/2017   HDL 57 08/21/2017   LDLCALC 88 08/21/2017   ALT 198 (H) 04/30/2019   AST 180 (H) 04/30/2019   NA 136 04/30/2019   K 4.9 04/30/2019   CL 102 04/30/2019   CREATININE 1.54 (H) 04/30/2019   BUN 28 (H) 04/30/2019   CO2 23 04/30/2019   TSH 0.925 03/21/2019   INR 1.6 (H) 04/27/2019   HGBA1C 9.0 (H) 04/03/2019   MICROALBUR 30 08/21/2017    PT/INR:  No results for input(s): LABPROT, INR in the last 72 hours. Radiology: DG CHEST PORT 1 VIEW  Result Date: 04/29/2019 CLINICAL DATA:  Dialysis catheter placement. EXAM: PORTABLE CHEST 1 VIEW COMPARISON:  Chest x-ray from same day at 0516 hours. FINDINGS: New left subclavian dialysis catheter with tip in the proximal right atrium. Interval removal of the Swan-Ganz catheter. The right internal jugular sheath remains in place. Interval removal of the left chest tube. Unchanged endotracheal and feeding tubes. Unchanged right upper extremity PICC line. Unchanged right chest tube. Stable cardiomediastinal silhouette with normal heart size status post CABG and mitral valve replacement. Mild diffuse interstitial thickening with patchy airspace disease  is similar to prior study. No pleural effusion or pneumothorax. No acute osseous abnormality. IMPRESSION: 1. New left subclavian dialysis catheter without complicating feature. 2. Interval removal of the left-sided chest tube.  No pneumothorax. 3. Unchanged pulmonary edema. Electronically Signed   By: Titus Dubin M.D.   On: 04/29/2019 11:25    Assessment/Plan: S/P Procedure(s) (LRB): REMOVAL OF IMPELLA 5.5 direct, LEFT VENTRICULAR ASSIST DEVICE.with CELL  SAVER, TEE (N/A) TRANSESOPHAGEAL ECHOCARDIOGRAM (TEE) (N/A) Bronchial Washings (N/A)  1. CV- A paced. S/p removal of Impella on 04/09.  On Amiodarone 30 mg/hr, Milrinone 0.125 mcg/kg/min, Nor epinephrine 19 mcg/min, and Vasopressin .01 units/min drips. Weaning drips as able.  Co ox this am increased to 72.8 2. Pulmonary-On vent with FiO2% 40 and weaning per CCM/pulmonary. Right chest tube with 75 cc last 24 hours. CXR this am appears stable. Hope to remove right chest tube. 3. Expected post op blood loss anemia-H and H this am decreased to 7.9 and 24.4 and he will be transfused 4. DM-CBGs 178/170/147. On Insulin. He was on Insulin and Metformin 1000 mg bid prior to surgery. Continue on Insulin for now as creatinine elevated and NPO. Pre op HGA1C 9. He will need close medical follow up after discharge 5. AKI-Creatinine slightly decreased to 1.54 6. Thrombocytopenia-platelets increased to 115,000 7. Acute systolic heart failure-Previously on Lasix drip. CVP 16 this am 8. GI-severe malnutrition of chronic illness. Nutrition recommended Cortrak, TFs 9. Elevated transaminases likely related to shock-AST 167, ALT 242, ALk phos 499 10. ID-on Fluconazole for Candida in urine. Completed Vanco and Cefepime for Enterococcus Faecalis.  Gates Jividen Liston Alba PA-C 04/30/2019 8:07 AM

## 2019-04-30 NOTE — Progress Notes (Signed)
ABG results relayed to PCCM md through [employer]

## 2019-04-30 NOTE — Progress Notes (Signed)
Patient ID: Brendan [name], male   DOB: 12-17-1955, 64 y.o.   MRN: [id number]     Advanced Heart Failure Rounding Note  PCP-Cardiologist: No primary care provider on file.   Subjective:    Events - Presented to [facility] with acute HF  EF 20-25% - Transferred to con - Cath 3/30 with severe 3 vessel disease. EF 25% - Developed PMVT on milrinone -> milrinone stopped -> developed worsening shock -> IABP placed on 4/1 - Clinical deterioration 4/1-> intubated - 4/2 underwent Impella 5.5 placement earlier and bilateral chest tubes with 3L out from each side.  - Extubated 4/4 - 04/19/2019 CABG and MVR - Extubated 4/7 - Back to OR for Impella 5.5 extraction 4/9, extubated.  TEE with EF 35%, RV ok, trivial MR s/p MV repair.  - CVVH started 4/10 with oliguria, rising creatinine and CVP.  - Deteriorating respiratory status, intubated again early am 4/12.  Enterococcus faecalis in sputum.   Intubated on NE 19, vasopressin 0.01, milrinone 0.125, amiodarone 30.  MAP still 60-65.  CVP 7-8 this morning on my measure, CVVH pulling net UF 50 cc/hr.  Weight down 1 lb.  Co-ox 73%. No UOP.   He is awake on vent, follows commands.  SBT ongoing.   He is now on vancomycin with E faecalis in sputum, fluconazole with candida in urine.   Echo (4/10): EF 25-30%, normal RV, no significant MR.  I do not think there is an apical thrombus (think trabeculation).    Objective:   Weight Range: 67.8 kg Body mass index is 20.85 kg/m.   Vital Signs:   Temp:  [97.9 F (36.6 C)-99.8 F (37.7 C)] 98.5 F (36.9 C) (04/14 0805) Pulse Rate:  [95-96] 95 (04/14 0700) Resp:  [10-34] 17 (04/14 0700) BP: (81-109)/(37-56) 82/51 (04/14 0500) SpO2:  [96 %-100 %] 100 % (04/14 0730) Arterial Line BP: (82-118)/(35-51) 98/42 (04/14 0700) FiO2 (%):  [30 %-40 %] 40 % (04/14 0730) Weight:  [67.8 kg] 67.8 kg (04/14 0200) Last BM Date: 04/29/19  Weight change: Filed Weights   04/28/19 0500 04/29/19 0500 04/30/19 0200  Weight:  71.2 kg 68.1 kg 67.8 kg    Intake/Output:   Intake/Output Summary (Last 24 hours) at 04/30/2019 0850 Last data filed at 04/30/2019 0800 Gross per 24 hour  Intake 3336.17 ml  Output 3895 ml  Net -558.83 ml      Physical Exam   General: NAD, intubated Neck: JVP 7-8, no thyromegaly or thyroid nodule.  Lungs: Clear to auscultation bilaterally with normal respiratory effort. CV: Nondisplaced PMI.  Heart regular S1/S2, no S3/S4, no murmur.  1+ edema to knees.  Abdomen: Soft, nontender, no hepatosplenomegaly, no distention.  Skin: Intact without lesions or rashes.  Neurologic: Awake, follows commands.   Extremities: No clubbing or cyanosis.  HEENT: Normal.    Telemetry   A-paced in 70s. Personally reviewed  Labs    CBC Recent Labs    04/29/19 0444 04/29/19 0656 04/30/19 0311 04/30/19 0436  WBC 10.9*  --   --  10.8*  HGB 9.2*   < > 8.8* 7.9*  HCT 27.6*   < > 26.0* 24.4*  MCV 85.7  --   --  86.2  PLT 170  --   --  211   < > = values in this interval not displayed.   Basic Metabolic Panel Recent Labs    04/29/19 0444 04/29/19 0656 04/29/19 1644 04/29/19 1644 04/30/19 0311 04/30/19 0436  NA 135   < >  137   < > 138 136  K 5.0   < > 5.2*   < > 4.7 4.9  CL 102   < > 105  --   --  102  CO2 21*   < > 21*  --   --  23  GLUCOSE 167*   < > 178*  --   --  134*  BUN 27*   < > 28*  --   --  28*  CREATININE 1.63*   < > 1.58*  --   --  1.54*  CALCIUM 7.2*   < > 7.0*  --   --  7.3*  MG 2.5*  --   --   --   --  2.5*  PHOS 1.6*   < > 1.6*  --   --  1.5*   < > = values in this interval not displayed.   Liver Function Tests Recent Labs    04/29/19 0444 04/29/19 0444 04/29/19 1644 04/30/19 0436  AST 167*  --   --  180*  ALT 242*  --   --  198*  ALKPHOS 499*  --   --  488*  BILITOT 1.0  --   --  1.0  PROT 5.3*  --   --  5.2*  ALBUMIN 1.5*  1.5*   < > 1.4* 1.3*  1.4*   < > = values in this interval not displayed.   No results for input(s): LIPASE, AMYLASE in the  last 72 hours. Cardiac Enzymes No results for input(s): CKTOTAL, CKMB, CKMBINDEX, TROPONINI in the last 72 hours.  BNP: BNP (last 3 results) Recent Labs    04/03/2019 0113  BNP 655.7*    ProBNP (last 3 results) No results for input(s): PROBNP in the last 8760 hours.   D-Dimer No results for input(s): DDIMER in the last 72 hours. Hemoglobin A1C No results for input(s): HGBA1C in the last 72 hours. Fasting Lipid Panel No results for input(s): CHOL, HDL, LDLCALC, TRIG, CHOLHDL, LDLDIRECT in the last 72 hours. Thyroid Function Tests No results for input(s): TSH, T4TOTAL, T3FREE, THYROIDAB in the last 72 hours.  Invalid input(s): FREET3  Other results:   Imaging    DG CHEST PORT 1 VIEW  Result Date: 04/29/2019 CLINICAL DATA:  Dialysis catheter placement. EXAM: PORTABLE CHEST 1 VIEW COMPARISON:  Chest x-ray from same day at 0516 hours. FINDINGS: New left subclavian dialysis catheter with tip in the proximal right atrium. Interval removal of the Swan-Ganz catheter. The right internal jugular sheath remains in place. Interval removal of the left chest tube. Unchanged endotracheal and feeding tubes. Unchanged right upper extremity PICC line. Unchanged right chest tube. Stable cardiomediastinal silhouette with normal heart size status post CABG and mitral valve replacement. Mild diffuse interstitial thickening with patchy airspace disease is similar to prior study. No pleural effusion or pneumothorax. No acute osseous abnormality. IMPRESSION: 1. New left subclavian dialysis catheter without complicating feature. 2. Interval removal of the left-sided chest tube.  No pneumothorax. 3. Unchanged pulmonary edema. Electronically Signed   By: Titus Dubin M.D.   On: 04/29/2019 11:25     Medications:     Scheduled Medications: . aspirin EC  325 mg Oral Daily   Or  . aspirin  324 mg Per Tube Daily  . B-complex with vitamin C  1 tablet Per Tube Daily  . bisacodyl  10 mg Oral Daily   Or   . bisacodyl  10 mg Rectal Daily  . chlorhexidine gluconate (  MEDLINE KIT)  15 mL Mouth Rinse BID  . Chlorhexidine Gluconate Cloth  6 each Topical Daily  . docusate  200 mg Per Tube Daily  . feeding supplement (ENSURE ENLIVE)  237 mL Oral TID BM  . feeding supplement (PRO-STAT SUGAR FREE 64)  30 mL Per Tube BID  . gabapentin  300 mg Per Tube Q8H  . insulin aspart  0-24 Units Subcutaneous Q4H  . insulin glargine  8 Units Subcutaneous BID  . mouth rinse  15 mL Mouth Rinse 10 times per day  . pantoprazole sodium  40 mg Per Tube Daily  . pneumococcal 23 valent vaccine  0.5 mL Intramuscular Tomorrow-1000  . polyethylene glycol  17 g Oral Daily  . rosuvastatin  10 mg Per Tube q1800  . sodium chloride flush  10-40 mL Intracatheter Q12H  . sorbitol  30 mL Per Tube BID    Infusions: .  prismasol BGK 4/2.5 400 mL/hr at 04/30/19 0111  . sodium chloride Stopped (04/29/19 0928)  . sodium chloride    . sodium chloride 10 mL/hr at 04/21/2019 1800  . sodium chloride 20 mL/hr at 05/11/2019 1800  . sodium chloride    . amiodarone 30 mg/hr (04/30/19 0800)  . dexmedetomidine 0.2 mcg/kg/hr (04/30/19 0800)  . epinephrine Stopped (04/28/19 0138)  . feeding supplement (VITAL AF 1.2 CAL) 1,000 mL (04/29/19 1522)  . fentaNYL infusion INTRAVENOUS Stopped (04/29/19 1812)  . fluconazole (DIFLUCAN) IV Stopped (04/29/19 2306)  . heparin 10,000 units/ 20 mL infusion syringe 1,000 Units/hr (04/29/19 2303)  . lactated ringers Stopped (05/03/2019 1640)  . lactated ringers 10 mL/hr at 04/30/19 0800  . milrinone 0.125 mcg/kg/min (04/30/19 0800)  . norepinephrine (LEVOPHED) Adult infusion 19 mcg/min (04/30/19 0800)  . prismasol BGK 0/2.5 200 mL/hr at 04/29/19 1930  . prismasol BGK 4/2.5 1,800 mL/hr at 04/30/19 0620  . vasopressin (PITRESSIN) infusion - *FOR SHOCK* 0.01 Units/min (04/30/19 0829)    PRN Medications: sodium chloride, Place/Maintain arterial line **AND** sodium chloride, alteplase, fentaNYL, fentaNYL  (SUBLIMAZE) injection, fentaNYL (SUBLIMAZE) injection, heparin, levalbuterol, metoprolol tartrate, midazolam, ondansetron (ZOFRAN) IV, ondansetron (ZOFRAN) IV, sodium chloride, sodium chloride flush     Assessment/Plan    1.Acute systolic HF ->  Cardiogenic Shock  - Due to iCM. EF 20-25% - Impella 5.5 placed on 4/2.   - s/p CABG/MVRepair on 4/6  - Impella out 4/9.  - Echo 4/10 with EF 25-30%, normal RV, no MR.   - Now on CVVH with volume overload/progressive renal failure.  - Co-ox 73% on norepinephrine 19 + vasopressin 0.01 + milrinone 0.125.  MAP 60-65, pressors not weanable at this time.  Will slow CVVH to run even and stop milrinone gtt to see if we can get better pressure, if we can extubate, that may help as well.  - Volume status improved, CVP now 7-8.  Weight down again, think we are close to pre-op weight.  For now, would run UF net even to slightly negative.   2. CAD - LHC with severe 3 vessel CAD  - s/p CABG x 3 MV Repair 4/6  - on ASA/Crestor  3. Acute hypoxic respiratory failure - Extubated 4/9.  - CXR with extensive pulmonary edema and ?PNA component, re-intubated on 4/12.   - CVP now down considerably with CVVH, SBT today.  If cannot be extubated, will need tracheostomy.   4. Mitral Regurgitation - Mod-severe on ECHO - s/p MV repair, TEE 4/9 with minimal MR s/p repair.   - Echo 4/10 with no  significant MR.   5. Uncontrolled DM - Hgb A1C 9.  - On insulin and sliding scale.   6. AKI - due to shock - Became oliguric and now on CVVH to control volume overload.  Renal following.   7. Acute blood loss Anemia  - To get 1 unit PRBCs today.    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  - Keep K >4.   - Keep  Mag >2   9. 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 No sensation R and L foot and occasionaly in his hands. Requires assistance with ADLs.   10. Severe Malnutrition - Prealbumin 8.9  - Nutrition on board  - Continue TFs  11. ID - Enterococcus faecalis in sputum => vancomycin x 7 days.  - Candida in urine => fluconazole x 7 days.   12. Elevated LFTs - Suspect shock liver, trended down but stable today.   CRITICAL CARE Performed by: Loralie Champagne  Total critical care time:40 minutes  Critical care time was exclusive of separately billable procedures and treating other patients.  Critical care was necessary to treat or prevent imminent or life-threatening deterioration.  Critical care was time spent personally by me (independent of midlevel providers or residents) on the following activities: development of treatment plan with patient and/or surrogate as well as nursing, discussions with consultants, evaluation of patient's response to treatment, examination of patient, obtaining history from patient or surrogate, ordering and performing treatments and interventions, ordering and review of laboratory studies, ordering and review of radiographic studies, pulse oximetry and re-evaluation of patient's condition.   Length of Stay: [place], MD  04/30/2019, 8:50 AM  Advanced Heart Failure Team Pager [phone] (M-F; 7a - 4p)  Please contact [facility] for night-coverage after hours (4p -7a ) and weekends on [email or web address]

## 2019-04-30 NOTE — Progress Notes (Signed)
Patient ID: Brendan [name], male   DOB: 03/05/1955, 64 y.o.   MRN: [id number] [facility] Progress Note   Assessment/ Plan:   1. Acute kidney Injury: Anuric and without evidence of renal recovery.  Likely ischemic ATN associated with cardiogenic shock status post CABG in patient with low ejection fraction.  Continue CRRT at this time for management of azotemia/clearance and volume unloading (rate of UF now down to about 50 cc an hour).  Will make adjustments with dialysis potassium. 2.  Coronary artery disease status post three-vessel CABG with mitral valve replacement on 4/2: With cardiogenic shock and efforts at CRRT for volume unloading.  Efforts underway to lighten sedation and he reports possible extubation. 3.  Anemia: Hemoglobin/hematocrit trend noted, continue to monitor with ongoing overt loss for PRBCs. 4.  Uncontrolled diabetes mellitus: Ongoing insulin/sliding scale management. 5.  Leukocytosis/candiduria/Enterococcus from sputum culture: On intravenous cefepime, vancomycin and fluconazole.  Afebrile.  Subjective:   Without acute clinical events overnight.   Objective:   BP (!) 82/51   Pulse 95   Temp 98.5 F (36.9 C) (Oral)   Resp 17   Ht '5\' 11"'$  (1.803 m) Comment: measured x 3  Wt 67.8 kg   SpO2 100%   BMI 20.85 kg/m   Intake/Output Summary (Last 24 hours) at 04/30/2019 0809 Last data filed at 04/30/2019 0700 Gross per 24 hour  Intake 3214.66 ml  Output 3713 ml  Net -498.34 ml   Weight change: -0.3 kg  Physical Exam: Gen: Intubated, appears somnolent/lethargic, comfortable CVS: Pulse regular rhythm, normal rate, S1 and S2 normal Resp: Anteriorly clear to auscultation, chest tubes in situ.  Sternotomy wound intact. Abd: Soft, flat, nontender Ext: Trace + lower extremity edema.  Imaging: DG CHEST PORT 1 VIEW  Result Date: 04/29/2019 CLINICAL DATA:  Dialysis catheter placement. EXAM: PORTABLE CHEST 1 VIEW COMPARISON:  Chest x-ray from same day at  0516 hours. FINDINGS: New left subclavian dialysis catheter with tip in the proximal right atrium. Interval removal of the Swan-Ganz catheter. The right internal jugular sheath remains in place. Interval removal of the left chest tube. Unchanged endotracheal and feeding tubes. Unchanged right upper extremity PICC line. Unchanged right chest tube. Stable cardiomediastinal silhouette with normal heart size status post CABG and mitral valve replacement. Mild diffuse interstitial thickening with patchy airspace disease is similar to prior study. No pleural effusion or pneumothorax. No acute osseous abnormality. IMPRESSION: 1. New left subclavian dialysis catheter without complicating feature. 2. Interval removal of the left-sided chest tube.  No pneumothorax. 3. Unchanged pulmonary edema. Electronically Signed   By: Titus Dubin M.D.   On: 04/29/2019 11:25   DG Chest Port 1 View  Result Date: 04/29/2019 CLINICAL DATA:  ETT / chest tuber present  Post open heart surg EXAM: PORTABLE CHEST - 1 VIEW COMPARISON:  the previous day's study FINDINGS: Endotracheal tube, feeding tube, right IJ Swan-Ganz, bilateral chest tubes remain in place. Changes of median sternotomy, CABG, valve surgery. No pneumothorax or effusion. Heart size and mediastinal contour normal. Interval decrease in the bilateral interstitial edema or infiltrates with mild residual. IMPRESSION: Improving bilateral interstitial edema/infiltrates. Electronically Signed   By: Lucrezia Europe M.D.   On: 04/29/2019 08:53    Labs: BMET Recent Labs  Lab 04/27/19 0429 04/27/19 0437 04/27/19 1535 04/27/19 1536 04/28/19 0411 04/28/19 0419 04/28/19 1526 04/28/19 1622 04/29/19 0444 04/29/19 0656 04/29/19 1644 04/30/19 0311 04/30/19 0436  NA 134*   < > 137   < > 137   < >  137 137 135 136 137 138 136  K 4.4   < > 4.7   < > 4.9   < > 4.7 4.9 5.0 4.8 5.2* 4.7 4.9  CL 101  --  104  --  102  --   --  103 102  --  105  --  102  CO2 21*  --  23  --  21*  --    --  24 21*  --  21*  --  23  GLUCOSE 129*  --  181*  --  161*  --   --  227* 167*  --  178*  --  134*  BUN 38*  --  34*  --  27*  --   --  27* 27*  --  28*  --  28*  CREATININE 2.20*  --  1.94*  --  1.84*  --   --  1.65* 1.63*  --  1.58*  --  1.54*  CALCIUM 7.5*  --  7.1*  --  7.2*  --   --  7.0* 7.2*  --  7.0*  --  7.3*  PHOS 3.0  --  2.5  --  3.4  --   --  2.3* 1.6*  --  1.6*  --  1.5*   < > = values in this interval not displayed.   CBC Recent Labs  Lab 04/27/19 0944 04/27/19 1536 04/28/19 0411 04/28/19 0419 04/29/19 0444 04/29/19 0656 04/30/19 0311 04/30/19 0436  WBC 16.2*  --  13.7*  --  10.9*  --   --  10.8*  HGB 9.5*   < > 9.4*   < > 9.2* 10.2* 8.8* 7.9*  HCT 28.0*   < > 28.4*   < > 27.6* 30.0* 26.0* 24.4*  MCV 84.8  --  87.1  --  85.7  --   --  86.2  PLT 131*  --  132*  --  170  --   --  211   < > = values in this interval not displayed.    Medications:    . aspirin EC  325 mg Oral Daily   Or  . aspirin  324 mg Per Tube Daily  . B-complex with vitamin C  1 tablet Per Tube Daily  . bisacodyl  10 mg Oral Daily   Or  . bisacodyl  10 mg Rectal Daily  . chlorhexidine gluconate (MEDLINE KIT)  15 mL Mouth Rinse BID  . Chlorhexidine Gluconate Cloth  6 each Topical Daily  . docusate  200 mg Per Tube Daily  . feeding supplement (ENSURE ENLIVE)  237 mL Oral TID BM  . feeding supplement (PRO-STAT SUGAR FREE 64)  30 mL Per Tube BID  . gabapentin  300 mg Per Tube Q8H  . insulin aspart  0-24 Units Subcutaneous Q4H  . insulin glargine  8 Units Subcutaneous BID  . mouth rinse  15 mL Mouth Rinse 10 times per day  . pantoprazole sodium  40 mg Per Tube Daily  . pneumococcal 23 valent vaccine  0.5 mL Intramuscular Tomorrow-1000  . polyethylene glycol  17 g Oral Daily  . rosuvastatin  10 mg Per Tube q1800  . sodium chloride flush  10-40 mL Intracatheter Q12H  . sorbitol  30 mL Per Tube BID   Elmarie Shiley, MD 04/30/2019, 8:09 AM

## 2019-05-01 ENCOUNTER — Inpatient Hospital Stay (HOSPITAL_COMMUNITY): Payer: Medicaid Other

## 2019-05-01 LAB — RENAL FUNCTION PANEL
Albumin: 1.4 g/dL — ABNORMAL LOW (ref 3.5–5.0)
Albumin: 1.4 g/dL — ABNORMAL LOW (ref 3.5–5.0)
Anion gap: 10 (ref 5–15)
Anion gap: 11 (ref 5–15)
BUN: 29 mg/dL — ABNORMAL HIGH (ref 8–23)
BUN: 28 mg/dL — ABNORMAL HIGH (ref 8–23)
CO2: 24 mmol/L (ref 22–32)
CO2: 23 mmol/L (ref 22–32)
Calcium: 7.3 mg/dL — ABNORMAL LOW (ref 8.9–10.3)
Calcium: 7.7 mg/dL — ABNORMAL LOW (ref 8.9–10.3)
Chloride: 102 mmol/L (ref 98–111)
Chloride: 102 mmol/L (ref 98–111)
Creatinine, Ser: 1.58 mg/dL — ABNORMAL HIGH (ref 0.61–1.24)
Creatinine, Ser: 1.46 mg/dL — ABNORMAL HIGH (ref 0.61–1.24)
GFR calc Af Amer: 54 mL/min — ABNORMAL LOW (ref >60)
GFR calc Af Amer: 59 mL/min — ABNORMAL LOW (ref >60)
GFR calc non Af Amer: 47 mL/min — ABNORMAL LOW (ref >60)
GFR calc non Af Amer: 51 mL/min — ABNORMAL LOW (ref >60)
Glucose, Bld: 135 mg/dL — ABNORMAL HIGH (ref 70–99)
Glucose, Bld: 161 mg/dL — ABNORMAL HIGH (ref 70–99)
Phosphorus: 2.1 mg/dL — ABNORMAL LOW (ref 2.5–4.6)
Phosphorus: 1.3 mg/dL — ABNORMAL LOW (ref 2.5–4.6)
Potassium: 4.8 mmol/L (ref 3.5–5.1)
Potassium: 5 mmol/L (ref 3.5–5.1)
Sodium: 136 mmol/L (ref 135–145)
Sodium: 136 mmol/L (ref 135–145)

## 2019-05-01 LAB — CULTURE, RESPIRATORY W GRAM STAIN: Culture: NO GROWTH

## 2019-05-01 LAB — POCT I-STAT 7, (LYTES, BLD GAS, ICA,H+H)
Acid-Base Excess: 4 mmol/L — ABNORMAL HIGH (ref 0.0–2.0)
Acid-Base Excess: 3 mmol/L — ABNORMAL HIGH (ref 0.0–2.0)
Bicarbonate: 29.3 mmol/L — ABNORMAL HIGH (ref 20.0–28.0)
Bicarbonate: 28.7 mmol/L — ABNORMAL HIGH (ref 20.0–28.0)
Bicarbonate: 26.9 mmol/L (ref 20.0–28.0)
Calcium, Ion: 1.13 mmol/L — ABNORMAL LOW (ref 1.15–1.40)
Calcium, Ion: 1.04 mmol/L — ABNORMAL LOW (ref 1.15–1.40)
Calcium, Ion: 1.13 mmol/L — ABNORMAL LOW (ref 1.15–1.40)
HCT: 30 % — ABNORMAL LOW (ref 39.0–52.0)
HCT: 29 % — ABNORMAL LOW (ref 39.0–52.0)
HCT: 27 % — ABNORMAL LOW (ref 39.0–52.0)
Hemoglobin: 10.2 g/dL — ABNORMAL LOW (ref 13.0–17.0)
Hemoglobin: 9.9 g/dL — ABNORMAL LOW (ref 13.0–17.0)
Hemoglobin: 9.2 g/dL — ABNORMAL LOW (ref 13.0–17.0)
O2 Saturation: 99 %
O2 Saturation: 98 %
O2 Saturation: 94 %
Patient temperature: 98.1
Patient temperature: 98.3
Patient temperature: 100
Potassium: 4.7 mmol/L (ref 3.5–5.1)
Potassium: 5.7 mmol/L — ABNORMAL HIGH (ref 3.5–5.1)
Potassium: 4.9 mmol/L (ref 3.5–5.1)
Sodium: 137 mmol/L (ref 135–145)
Sodium: 135 mmol/L (ref 135–145)
Sodium: 136 mmol/L (ref 135–145)
TCO2: 31 mmol/L (ref 22–32)
TCO2: 30 mmol/L (ref 22–32)
TCO2: 28 mmol/L (ref 22–32)
pCO2 arterial: 70.3 mmHg (ref 32.0–48.0)
pCO2 arterial: 42.9 mmHg (ref 32.0–48.0)
pCO2 arterial: 36.2 mmHg (ref 32.0–48.0)
pH, Arterial: 7.226 — ABNORMAL LOW (ref 7.350–7.450)
pH, Arterial: 7.432 (ref 7.350–7.450)
pH, Arterial: 7.481 — ABNORMAL HIGH (ref 7.350–7.450)
pO2, Arterial: 142 mmHg — ABNORMAL HIGH (ref 83.0–108.0)
pO2, Arterial: 104 mmHg (ref 83.0–108.0)
pO2, Arterial: 69 mmHg — ABNORMAL LOW (ref 83.0–108.0)

## 2019-05-01 LAB — HEPATIC FUNCTION PANEL
ALT: 231 U/L — ABNORMAL HIGH (ref 0–44)
AST: 301 U/L — ABNORMAL HIGH (ref 15–41)
Albumin: 1.4 g/dL — ABNORMAL LOW (ref 3.5–5.0)
Alkaline Phosphatase: 501 U/L — ABNORMAL HIGH (ref 38–126)
Bilirubin, Direct: 0.5 mg/dL — ABNORMAL HIGH (ref 0.0–0.2)
Indirect Bilirubin: 0.3 mg/dL (ref 0.3–0.9)
Total Bilirubin: 0.8 mg/dL (ref 0.3–1.2)
Total Protein: 5.1 g/dL — ABNORMAL LOW (ref 6.5–8.1)

## 2019-05-01 LAB — PROTIME-INR
INR: 1.6 — ABNORMAL HIGH (ref 0.8–1.2)
Prothrombin Time: 18.7 seconds — ABNORMAL HIGH (ref 11.4–15.2)

## 2019-05-01 LAB — GLUCOSE, CAPILLARY
Glucose-Capillary: 112 mg/dL — ABNORMAL HIGH (ref 70–99)
Glucose-Capillary: 120 mg/dL — ABNORMAL HIGH (ref 70–99)
Glucose-Capillary: 150 mg/dL — ABNORMAL HIGH (ref 70–99)
Glucose-Capillary: 157 mg/dL — ABNORMAL HIGH (ref 70–99)
Glucose-Capillary: 193 mg/dL — ABNORMAL HIGH (ref 70–99)
Glucose-Capillary: 214 mg/dL — ABNORMAL HIGH (ref 70–99)

## 2019-05-01 LAB — CBC
HCT: 28.3 % — ABNORMAL LOW (ref 39.0–52.0)
Hemoglobin: 9.3 g/dL — ABNORMAL LOW (ref 13.0–17.0)
MCH: 28.6 pg (ref 26.0–34.0)
MCHC: 32.9 g/dL (ref 30.0–36.0)
MCV: 87.1 fL (ref 80.0–100.0)
Platelets: 212 10*3/uL (ref 150–400)
RBC: 3.25 MIL/uL — ABNORMAL LOW (ref 4.22–5.81)
RDW: 16.7 % — ABNORMAL HIGH (ref 11.5–15.5)
WBC: 16.6 10*3/uL — ABNORMAL HIGH (ref 4.0–10.5)
nRBC: 0.6 % — ABNORMAL HIGH (ref 0.0–0.2)

## 2019-05-01 LAB — COOXEMETRY PANEL
Carboxyhemoglobin: 0.9 % (ref 0.5–1.5)
Methemoglobin: 0.7 % (ref 0.0–1.5)
O2 Saturation: 71.5 %
Total hemoglobin: 8.6 g/dL — ABNORMAL LOW (ref 12.0–16.0)

## 2019-05-01 LAB — GAMMA GT: GGT: 219 U/L — ABNORMAL HIGH (ref 7–50)

## 2019-05-01 LAB — APTT: aPTT: 42 seconds — ABNORMAL HIGH (ref 24–36)

## 2019-05-01 LAB — MAGNESIUM: Magnesium: 2.6 mg/dL — ABNORMAL HIGH (ref 1.7–2.4)

## 2019-05-01 LAB — PREPARE RBC (CROSSMATCH)

## 2019-05-01 MED ORDER — SORBITOL 70 % SOLN: 30.0000 mL | Freq: Every day | Status: DC | PRN

## 2019-05-01 MED ORDER — AMIODARONE HCL IN DEXTROSE 360-4.14 MG/200ML-% IV SOLN
30.0000 mg/h | INTRAVENOUS | Status: DC
  Administered 2019-05-01 (×5): 60 mg/h via INTRAVENOUS
  Administered 2019-05-02: 30 mg/h via INTRAVENOUS
  Administered 2019-05-02 – 2019-05-03 (×4): 60 mg/h via INTRAVENOUS
  Filled 2019-05-01 (×8): qty 200
  Filled 2019-05-01: qty 400

## 2019-05-01 MED ORDER — VANCOMYCIN HCL 750 MG/150ML IV SOLN
750.0000 mg | INTRAVENOUS | Status: DC
  Administered 2019-05-01: 750 mg via INTRAVENOUS
  Filled 2019-05-01: qty 150

## 2019-05-01 MED ORDER — SODIUM CHLORIDE 0.9 % IV SOLN
1.0000 g | Freq: Once | INTRAVENOUS | Status: AC
  Administered 2019-05-01: 1 g via INTRAVENOUS
  Filled 2019-05-01: qty 10

## 2019-05-01 MED ORDER — PROPOFOL 1000 MG/100ML IV EMUL
5.0000 ug/kg/min | INTRAVENOUS | Status: AC
  Administered 2019-05-01: 5 ug/kg/min via INTRAVENOUS

## 2019-05-01 MED ORDER — SODIUM CHLORIDE 0.9 % IV SOLN
3.0000 g | Freq: Three times a day (TID) | INTRAVENOUS | Status: DC
  Administered 2019-05-02 – 2019-05-06 (×13): 3 g via INTRAVENOUS
  Filled 2019-05-01 (×3): qty 3
  Filled 2019-05-01: qty 8
  Filled 2019-05-01 (×10): qty 3

## 2019-05-01 MED ORDER — PROPOFOL 1000 MG/100ML IV EMUL
5.0000 ug/kg/min | INTRAVENOUS | Status: AC
  Administered 2019-05-01: 5 ug/kg/min via INTRAVENOUS
  Filled 2019-05-01: qty 100

## 2019-05-01 MED ORDER — ALBUMIN HUMAN 5 % IV SOLN
12.5000 g | Freq: Once | INTRAVENOUS | Status: AC
  Administered 2019-05-01: 12.5 g via INTRAVENOUS
  Filled 2019-05-01: qty 250

## 2019-05-01 NOTE — Progress Notes (Signed)
PCCM Interval Note   Called to bedside s/p code blue.  ABG was spot checked and found to have low PaO2.  Placed on BiPAP.  On BiPAP for ~30 mins and developed pulseless VT~2355  Received CPR for 2 mins and 1 epi with ROSC into SR, occasional pacing rhythm  TCTS notified of events by staff, placed back on amio gtt  Post ROSC vomited.    On our arrival to bedside with attending, patient upright in bed hypoxic in the 80's requiring NRB with labored breathing.  Awake, tracks, appears fatigued, follows some commands, no vocalization.  Diffuse rhonchi  Recent K and Mag levels acceptable.  Noted ongoing low phos  Currently remains on vasopressin 0.01 units/min, levophed 16 mcg/min, milrinone 0.174mcg/kg/min.  Remains on CRRT even balance.   Blood pressure (!) 94/54, pulse 95, temperature 98.1 F (36.7 C), temperature source Oral, resp. rate (!) 21, height 5\' 11"  (1.803 m), weight 67.8 kg, SpO2 100 %.   P: Hold TF x 4 hr Will reintubate now for ongoing respiratory failure, aspiration event Family updated by Dr. Deatra Canter Continue abx- vanc, cefepime, fluconazole    Kennieth Rad, MSN, AGACNP-BC [facility] 05/01/2019, 12:22 AM

## 2019-05-01 NOTE — Progress Notes (Addendum)
TCTS DAILY ICU PROGRESS NOTE                   [address]            [place] [address]          [phone]   6 Days Post-Op Procedure(s) (LRB): REMOVAL OF IMPELLA 5.5 direct, LEFT VENTRICULAR ASSIST DEVICE.with CELL SAVER, TEE (N/A) TRANSESOPHAGEAL ECHOCARDIOGRAM (TEE) (N/A) Bronchial Washings (N/A)  Total Length of Stay:  LOS: 17 days   Subjective: Patient with V tach arrest and re intubated after midnight. On CVVHD  Objective: Vital signs in last 24 hours: Temp:  [97.4 F (36.3 C)-98.5 F (36.9 C)] 98.3 F (36.8 C) (04/15 0430) Pulse Rate:  [83-102] 83 (04/15 0603) Cardiac Rhythm: Normal sinus rhythm (04/15 0430) Resp:  [13-25] 22 (04/15 0603) BP: (80-117)/(43-59) 103/59 (04/15 0600) SpO2:  [79 %-100 %] 100 % (04/15 0603) Arterial Line BP: (74-168)/(35-62) 104/45 (04/15 0603) FiO2 (%):  [70 %-80 %] 70 % (04/15 0500) Weight:  [67.8 kg] 67.8 kg (04/15 0554)  Filed Weights   04/29/19 0500 04/30/19 0200 05/01/19 0554  Weight: 68.1 kg 67.8 kg 67.8 kg    Weight change: 0 kg   Hemodynamic parameters for last 24 hours: CVP:  [10 mmHg-13 mmHg] 10 mmHg  Intake/Output from previous day: 04/14 0701 - 04/15 0700 In: 2361.8 [I.V.:1116.5; NG/GT:1235; IV Piggyback:10.3] Out: 2346 [Urine:10]  Intake/Output this shift: No intake/output data recorded.  Current Meds: Scheduled Meds: . aspirin EC  325 mg Oral Daily   Or  . aspirin  324 mg Per Tube Daily  . B-complex with vitamin C  1 tablet Per Tube Daily  . bisacodyl  10 mg Oral Daily   Or  . bisacodyl  10 mg Rectal Daily  . chlorhexidine gluconate (MEDLINE KIT)  15 mL Mouth Rinse BID  . Chlorhexidine Gluconate Cloth  6 each Topical Daily  . docusate  200 mg Per Tube Daily  . feeding supplement (PRO-STAT SUGAR FREE 64)  30 mL Per Tube BID  . gabapentin  300 mg Per Tube Q8H  . insulin aspart  0-24 Units Subcutaneous Q4H  . insulin glargine  8 Units Subcutaneous BID  . mouth rinse  15 mL Mouth Rinse  10 times per day  . pantoprazole sodium  40 mg Per Tube Daily  . pneumococcal 23 valent vaccine  0.5 mL Intramuscular Tomorrow-1000  . polyethylene glycol  17 g Oral Daily  . rosuvastatin  10 mg Per Tube q1800  . sodium chloride flush  10-40 mL Intracatheter Q12H  . sorbitol  30 mL Per Tube BID   Continuous Infusions: .  prismasol BGK 4/2.5 400 mL/hr at 05/01/19 0221  . sodium chloride Stopped (04/29/19 0928)  . sodium chloride    . sodium chloride 1 mL/hr at 04/30/19 1600  . sodium chloride 20 mL/hr at 04/30/19 1600  . sodium chloride    . amiodarone 60 mg/hr (05/01/19 0451)  . dexmedetomidine 0.5 mcg/kg/hr (05/01/19 0138)  . epinephrine Stopped (04/28/19 0138)  . feeding supplement (VITAL AF 1.2 CAL) 50 mL/hr at 05/01/19 0430  . fentaNYL infusion INTRAVENOUS Stopped (04/29/19 1812)  . heparin 10,000 units/ 20 mL infusion syringe 1,000 Units/hr (05/01/19 0430)  . lactated ringers Stopped (04/19/2019 1640)  . lactated ringers Stopped (04/30/19 0930)  . milrinone 0.125 mcg/kg/min (04/30/19 1900)  . norepinephrine (LEVOPHED) Adult infusion 40 mcg/min (05/01/19 0110)  . prismasol BGK 0/2.5 200 mL/hr at 04/30/19 2035  .  prismasol BGK 4/2.5 1,800 mL/hr at 05/01/19 0536  . vasopressin (PITRESSIN) infusion - *FOR SHOCK* 0.04 Units/min (05/01/19 0053)   PRN Meds:.sodium chloride, Place/Maintain arterial line **AND** sodium chloride, alteplase, fentaNYL, fentaNYL (SUBLIMAZE) injection, fentaNYL (SUBLIMAZE) injection, heparin, levalbuterol, metoprolol tartrate, midazolam, ondansetron (ZOFRAN) IV, ondansetron (ZOFRAN) IV, sodium chloride, sodium chloride flush  Heart: RRR Lungs: Slightly diminished basilar breath sounds but mostly clear Abdomen: Soft, non tender, rare bowel sounds this am Extremities: [employer], SCDs in place.  Wound: Sternal and right axillary wounds are clean and dry.   Lab Results: CBC: Recent Labs    04/30/19 0436 04/30/19 1116 05/01/19 0445 05/01/19 0449  WBC  10.8*  --   --  16.6*  HGB 7.9*   < > 9.9* 9.3*  HCT 24.4*   < > 29.0* 28.3*  PLT 211  --   --  212   < > = values in this interval not displayed.   BMET:  Recent Labs    04/30/19 1600 04/30/19 2311 05/01/19 0445 05/01/19 0449  NA 137   < > 135 136  K 4.4   < > 5.7* 4.8  CL 104  --   --  102  CO2 25  --   --  24  GLUCOSE 131*  --   --  135*  BUN 27*  --   --  29*  CREATININE 1.43*  --   --  1.58*  CALCIUM 7.3*  --   --  7.3*   < > = values in this interval not displayed.    CMET: Lab Results  Component Value Date   WBC 16.6 (H) 05/01/2019   HGB 9.3 (L) 05/01/2019   HCT 28.3 (L) 05/01/2019   PLT 212 05/01/2019   GLUCOSE 135 (H) 05/01/2019   CHOL 174 08/21/2017   TRIG 143 08/21/2017   HDL 57 08/21/2017   LDLCALC 88 08/21/2017   ALT 231 (H) 05/01/2019   AST 301 (H) 05/01/2019   NA 136 05/01/2019   K 4.8 05/01/2019   CL 102 05/01/2019   CREATININE 1.58 (H) 05/01/2019   BUN 29 (H) 05/01/2019   CO2 24 05/01/2019   TSH 0.925 03/30/2019   INR 1.6 (H) 04/24/2019   HGBA1C 9.0 (H) 04/13/2019   MICROALBUR 30 08/21/2017    PT/INR:  No results for input(s): LABPROT, INR in the last 72 hours. Radiology: DG CHEST PORT 1 VIEW  Result Date: 05/01/2019 CLINICAL DATA:  Intubation EXAM: PORTABLE CHEST 1 VIEW COMPARISON:  04/30/2019 FINDINGS: Support Apparatus: --Endotracheal tube: Tip at the level of the clavicular heads. --Enteric tube:Tip below the field of view --Catheter(s):Left subclavian catheter tip at the cavoatrial junction. Right PICC line tip also at the cavoatrial junction. --Other: Right chest tube has been removed. Right IJ catheter has been removed. Bibasilar opacities. No pleural effusion or pneumothorax visible. IMPRESSION: Radiographically appropriate position of endotracheal tube. Electronically Signed   By: Ulyses Jarred M.D.   On: 05/01/2019 01:01    Assessment/Plan: S/P Procedure(s) (LRB): REMOVAL OF IMPELLA 5.5 direct, LEFT VENTRICULAR ASSIST DEVICE.with  CELL SAVER, TEE (N/A) TRANSESOPHAGEAL ECHOCARDIOGRAM (TEE) (N/A) Bronchial Washings (N/A)  1. CV- Previously A paced. S/p removal of Impella on 04/09.  S/p V tach arrest. On Amiodarone 60 mg/hr, Milrinone 0.25 mcg/kg/min, Nor epinephrine 40 mcg/min, and Vasopressin .04 units/min drips. Weaning drips as able.  Co ox this am increased to 71.5 2. Pulmonary-ABG results this am noted. On vent with FiO2% 40. CXR this am appears stable. Will likely  have trach in am. 3. Expected post op blood loss anemia-H and H this am decreased to 9.3 and 28.3  4. DM-CBGs 178/170/147. On Insulin. He was on Insulin and Metformin 1000 mg bid prior to surgery. Continue on Insulin for now as creatinine elevated and NPO. Pre op HGA1C 9. He will need close medical follow up after discharge 5. AKI-Creatinine slightly increased to 1.58 6. Thrombocytopenia resolved-platelets up to 212,000 7. Acute systolic heart failure-Previously on Lasix drip. CVP 10 this am 8. GI-severe malnutrition of chronic illness.  Cortrak, TFs 9. Elevated transaminases likely related to shock-AST 301, ALT 231, ALk phos 501 10. ID-Completed Fluconazole for Candida in urine. Completed Vanco and Cefepime for Enterococcus Faecalis.  [name] PA-C 05/01/2019 7:41 AM   patient  Patient had VT with CPR 2 min and reintubation Responsive on vent but sedated will plan trach tomorrow am cont tube feeds and antibiotcs- enterococcus grown from bronchial washings No more VT after amioodarone , intubation  patient examined and medical record reviewed,agree with above note. [name] 05/01/2019

## 2019-05-01 NOTE — Procedures (Signed)
Intubation Procedure Note Brendan [name] [id number] September 22, 1955  Procedure: Intubation Indications: Respiratory insufficiency  Procedure Details Consent: Risks of procedure as well as the alternatives and risks of each were explained to the (patient/caregiver).  Consent for procedure obtained. Time Out: Verified patient identification, verified procedure, site/side was marked, verified correct patient position, special equipment/implants available, medications/allergies/relevent history reviewed, required imaging and test results available.  Performed  Maximum sterile technique was used including gloves, gown and mask.  Etomidate 20 mg, rocuronium 75 mg Grade 1 view, intubated with 7.5 mm tube   Evaluation Hemodynamic Status: BP stable throughout; O2 sats: stable throughout Patient's Current Condition: stable Complications: No apparent complications Patient did tolerate procedure well. Chest X-ray ordered to verify placement.  CXR: tube position acceptable.   [name] 05/01/2019

## 2019-05-01 NOTE — Progress Notes (Signed)
     [address]       [place], [address]             [phone]       Vtach arrest tonight ROSC after 1 round of ACLS and epi Placed on amio gtt Intubated by critical care due to hypoxia  Pressor support increased due to hypotesion  Brendan [name] [name]

## 2019-05-01 NOTE — Progress Notes (Signed)
Patient ID: Brendan [name], male   DOB: [birth year], 64 y.o.   MRN: [id number] [facility] Progress Note   Assessment/ Plan:   1. Acute kidney Injury: Anuric and without evidence of renal recovery.  Likely ischemic ATN associated with cardiogenic shock status post CABG in patient with low ejection fraction.  Continue CRRT at this time for management of azotemia/clearance and volume unloading (rate of UF now down to about 50 cc an hour).  Overall prognosis appears to be poor at this point. 2.  Coronary artery disease status post three-vessel CABG with mitral valve replacement on 4/2: With cardiogenic shock and efforts at CRRT for volume unloading.  Reintubated overnight after extubation yesterday afternoon. 3.  Anemia: Hemoglobin/hematocrit trend noted, without significant overt loss. 4.  Uncontrolled diabetes mellitus: Ongoing insulin/sliding scale management. 5.  Leukocytosis/candiduria/Enterococcus from sputum culture: On intravenous cefepime, vancomycin and fluconazole.  Afebrile.  Subjective:   With episode of ventricular tachycardia overnight following which patient was reintubated.   Objective:   BP (!) 103/59   Pulse 83   Temp 98.3 F (36.8 C) (Oral)   Resp (!) 22   Ht _0  (1.803 m) Comment: measured x 3  Wt 67.8 kg   SpO2 100%   BMI 20.85 kg/m   Intake/Output Summary (Last 24 hours) at 05/01/2019 0733 Last data filed at 05/01/2019 0700 Gross per 24 hour  Intake 2361.79 ml  Output 2346 ml  Net 15.79 ml   Weight change: 0 kg  Physical Exam: Gen: Intubated, appears somnolent/lethargic. CVS: Pulse regular rhythm, normal rate, S1 and S2 normal Resp: Anteriorly clear to auscultation, chest tubes in situ.  Sternotomy wound intact. Abd: Soft, flat, nontender Ext: Trace lower extremity edema.  Imaging: DG CHEST PORT 1 VIEW  Result Date: 05/01/2019 CLINICAL DATA:  Intubation EXAM: PORTABLE CHEST 1 VIEW COMPARISON:  04/30/2019 FINDINGS: Support Apparatus:  --Endotracheal tube: Tip at the level of the clavicular heads. --Enteric tube:Tip below the field of view --Catheter(s):Left subclavian catheter tip at the cavoatrial junction. Right PICC line tip also at the cavoatrial junction. --Other: Right chest tube has been removed. Right IJ catheter has been removed. Bibasilar opacities. No pleural effusion or pneumothorax visible. IMPRESSION: Radiographically appropriate position of endotracheal tube. Electronically Signed   By: Ulyses Jarred M.D.   On: 05/01/2019 01:01   DG Chest Port 1 View  Result Date: 04/30/2019 CLINICAL DATA:  Chest tube EXAM: PORTABLE CHEST 1 VIEW COMPARISON:  Yesterday FINDINGS: Endotracheal tube tip is just below the clavicular heads. 3 central lines with tips at the SVC or upper cavoatrial junction. There has been valve repair and CABG. Improving interstitial opacity. Stable right chest tube positioning with no visible pneumothorax. IMPRESSION: 1. Stable hardware positioning. 2. No visible pneumothorax. Electronically Signed   By: Monte Fantasia M.D.   On: 04/30/2019 09:01   DG CHEST PORT 1 VIEW  Result Date: 04/29/2019 CLINICAL DATA:  Dialysis catheter placement. EXAM: PORTABLE CHEST 1 VIEW COMPARISON:  Chest x-ray from same day at 0516 hours. FINDINGS: New left subclavian dialysis catheter with tip in the proximal right atrium. Interval removal of the Swan-Ganz catheter. The right internal jugular sheath remains in place. Interval removal of the left chest tube. Unchanged endotracheal and feeding tubes. Unchanged right upper extremity PICC line. Unchanged right chest tube. Stable cardiomediastinal silhouette with normal heart size status post CABG and mitral valve replacement. Mild diffuse interstitial thickening with patchy airspace disease is similar to prior study. No pleural effusion or pneumothorax. No  acute osseous abnormality. IMPRESSION: 1. New left subclavian dialysis catheter without complicating feature. 2. Interval removal  of the left-sided chest tube.  No pneumothorax. 3. Unchanged pulmonary edema. Electronically Signed   By: Titus Dubin M.D.   On: 04/29/2019 11:25    Labs: BMET Recent Labs  Lab 04/28/19 0411 04/28/19 0419 04/28/19 1622 04/28/19 1622 04/29/19 0444 04/29/19 0656 04/29/19 1644 04/30/19 0311 04/30/19 0436 04/30/19 0436 04/30/19 1116 04/30/19 1557 04/30/19 1600 04/30/19 2311 05/01/19 0143 05/01/19 0445 05/01/19 0449  NA 137   < > 137   < > 135   < > 137   < > 136   < > 137 137 137 137 137 135 136  K 4.9   < > 4.9   < > 5.0   < > 5.2*   < > 4.9   < > 4.8 4.3 4.4 4.3 4.7 5.7* 4.8  CL 102  --  103  --  102  --  105  --  102  --   --   --  104  --   --   --  102  CO2 21*  --  24  --  21*  --  21*  --  23  --   --   --  25  --   --   --  24  GLUCOSE 161*  --  227*  --  167*  --  178*  --  134*  --   --   --  131*  --   --   --  135*  BUN 27*  --  27*  --  27*  --  28*  --  28*  --   --   --  27*  --   --   --  29*  CREATININE 1.84*  --  1.65*  --  1.63*  --  1.58*  --  1.54*  --   --   --  1.43*  --   --   --  1.58*  CALCIUM 7.2*  --  7.0*  --  7.2*  --  7.0*  --  7.3*  --   --   --  7.3*  --   --   --  7.3*  PHOS 3.4  --  2.3*  --  1.6*  --  1.6*  --  1.5*  --   --   --  1.2*  --   --   --  2.1*   < > = values in this interval not displayed.   CBC Recent Labs  Lab 04/28/19 0411 04/28/19 0419 04/29/19 0444 04/29/19 0656 04/30/19 0436 04/30/19 1116 04/30/19 2311 05/01/19 0143 05/01/19 0445 05/01/19 0449  WBC 13.7*  --  10.9*  --  10.8*  --   --   --   --  16.6*  HGB 9.4*   < > 9.2*   < > 7.9*   < > 9.5* 10.2* 9.9* 9.3*  HCT 28.4*   < > 27.6*   < > 24.4*   < > 28.0* 30.0* 29.0* 28.3*  MCV 87.1  --  85.7  --  86.2  --   --   --   --  87.1  PLT 132*  --  170  --  211  --   --   --   --  212   < > = values in this interval not displayed.    Medications:    . aspirin EC  325 mg Oral Daily   Or  . aspirin  324 mg Per Tube Daily  . B-complex with vitamin C  1 tablet Per  Tube Daily  . bisacodyl  10 mg Oral Daily   Or  . bisacodyl  10 mg Rectal Daily  . chlorhexidine gluconate (MEDLINE KIT)  15 mL Mouth Rinse BID  . Chlorhexidine Gluconate Cloth  6 each Topical Daily  . docusate  200 mg Per Tube Daily  . feeding supplement (PRO-STAT SUGAR FREE 64)  30 mL Per Tube BID  . gabapentin  300 mg Per Tube Q8H  . insulin aspart  0-24 Units Subcutaneous Q4H  . insulin glargine  8 Units Subcutaneous BID  . mouth rinse  15 mL Mouth Rinse 10 times per day  . pantoprazole sodium  40 mg Per Tube Daily  . pneumococcal 23 valent vaccine  0.5 mL Intramuscular Tomorrow-1000  . polyethylene glycol  17 g Oral Daily  . rosuvastatin  10 mg Per Tube q1800  . sodium chloride flush  10-40 mL Intracatheter Q12H  . sorbitol  30 mL Per Tube BID   Elmarie Shiley, MD 05/01/2019, 7:33 AM

## 2019-05-01 NOTE — Progress Notes (Signed)
Chaplain responded to Code Blue.  Patient revived with medical team engaged.  Chaplain checked in but not needed at this time.  Invited nurse to call Chaplain needed.  De Burrs Chaplain Resident

## 2019-05-01 NOTE — Progress Notes (Signed)
Patient was attempting to take off bipap mask at about 2354 . Whiles explaining  to patient the need to keep mask on, he became pulseless  and unconscious with a ventricular tachycardia rhythm.Rosc was achieved in less than 98minutes following cpr and 1 amp of epi . Patient followed commands (was able to wave). Wife  was called and updated by RN and Md Deatra Canter of critical care. Daughter was updated as well.

## 2019-05-01 NOTE — Progress Notes (Signed)
20mg  of etomidate and 75mg  of rocuronium was given for rapid sequence intubation. Patient tolerated procedure well.

## 2019-05-01 NOTE — Progress Notes (Signed)
PULMONARY / CRITICAL CARE MEDICINE   NAME:  Brendan [name], MRN:  [id number], DOB:  12-Dec-1955, LOS: 50 ADMISSION DATE:  04/01/2019, CONSULTATION DATE:  05/08/2019 REFERRING MD:  Dr. Haroldine Laws, CHIEF COMPLAINT:  Respiratory distress  BRIEF HISTORY:    64 yoM originally presented with SOB and fatigue at [facility] found to have new HFrEF, NSTEMI, and bilateral pleural effusions transferred to [facility] on 3/29 for further cardiac evaluation. 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 Started on lasix and milrinone gtts however developed NSVT. Was taken 4/1 for placement of IABP and swan for optimization prior to CABG. Was on precedex for agitation/ confusion, some concern for DTs. On the evening of 4/1, patient developed worsening respiratory distress and hypoxia, PCCM consulted for intubation and vent management.   He was successfully extubated on 4/4 and PCCM signed off. Impella was removed 4/11 and patient noted to have increased WOB throughout the day he was placed on Bipap, but mental status continued to worsen and PCCM consulted for possible re-intubation. HISTORY OF PRESENT ILLNESS   Mr. Brendan [name] is a 64 yo male withpoorly controlled diabetes complicated by neuropathy and hypertension who presented to an outside hospital on 3/26 with a chief complaint of general malaise and shortness of breath for the past 4 days. He is now transferred to [facility] for evaluation of troponin elevation, evaluation of newly depressed EF,and consideration for coronary angiography.  History obtained from patient report as well as discharge paperwork from outside hospital. Briefly, patient reportedly had nausea, vomiting, diarrhea approximately 1 week and is confined to his bed. He finally presented to the outside hospital ED he was found to have a blood sugar of 516, BUN of 53, creatinine 1.2, and anion gap of 17. Patient's BNP was elevated to 8900. Troponin was elevated to 13.1 with changes noted by the team of their  lateral leads as well as V1 and V2. Patient was started on aspirin and a heparin drip. Chest x-ray on admission was concerning for bilateral pulmonary opacities differential including infection and pulmonary edema. So he was started on empiric ceftriaxone and doxycycline, although his procalcitonin was negative. He had a chest wall echo that was performed that showed an EF of 25% was diuresed during his admission with improvement in his shortness of breath as well as radiographically. On discussion with patient he has been chest pain-free since his admission totheoutside hospital. In addition, he feels that his shortness of breath is much improved.the  Patient had left heart cath on 03/30/2019 showing severe three-vessel disease. 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 He then developed worsening cardiogenic shock and was intubated on 4/1 andplaced on a IABP on 4/02. He also had bilateral chest tubes placed with3 L removed from each side. He was then extubated on 4/4. On 4/6 patient had CABG/MVRperformed and was extubated on 4/7 with improved mentation with weaning of sedation and pressors. Patient had Impella removed on 4/9 and subsequent TEE showing an EF of 35%with trivial MR s/p MV repair. Remained on BiPAP at this time.On 4/10 nephrology was consulted due to anuric sinceCABG and started on CRRT.Developing worsening shortness of breath and increased work of breathing on BiPAP with hypoxia on ABG and subsequently reintubated on 4/12.  Patient was extubated on 4/14 and developed worsening hypoxia on BiPAP. Overnight he developed pulseless VT requiring 1 minute of CPR and epinephrine to achieve ROSC. Neurovascularly intact afterward. Subsequently had increased work of breathing and worsening respiratory distress requiring intubation.  SIGNIFICANT PAST MEDICAL HISTORY   Tobacco abuse, HTN, poorly controlled diabetes, diabetic  neuropathy  SIGNIFICANT EVENTS:  3/30 Lt heart cath/TEE performed 4/1 Intubated, RHC/IABP/PA cath 4/2 Impella placement 4/3 febrile over evening and night. Blood cultures obtained. Vanc/cefepime started. Hematuria--heparin held. 4/4 Extubated,abx transitioned to rocephin. Heparin restarted 4/6 Re-intubated for CABG/MVR 4/7 Extubated from CABG to BiPAP 4/9 Impella removed  4/9 TEE 4/10 CVC inserted &CRRT initiated  4/12 PCCMre-consulted re-intubation 4/14 Extubated to BiPAP 4/15 Re-intubated due to respiratory distress STUDIES:   3/30 R/ LHC >>  Ost LM to Mid LM lesion is 35% stenosed.  Prox LAD lesion is 90% stenosed.  Prox LAD to Mid LAD lesion is 50% stenosed.  Mid Cx lesion is 100% stenosed.  Prox RCA to Mid RCA lesion is 90% stenosed.  RPDA lesion is 95% stenosed.  LV end diastolic pressure is severely elevated.  Hemodynamic findings consistent with moderate pulmonary hypertension. 1. Severe 3 vessel obstructive CAD 2. High LV filling pressures 3. Reduced cardiac output with index 2.3 4. Moderate pulmonary HTN.  3/30TTE >> 1. Left ventricular ejection fraction, by estimation, is 30 to 35%. The left ventricle has moderately decreased function. The left ventricle demonstrates regional wall motion abnormalities.There is mild left ventricular hypertrophy. Left ventricular diastolic function could not be evaluated. There is severe hypokinesis of the left ventricular, entire inferior wall, inferoseptal wall, apical segment and lateral wall.  2. Right ventricular systolic function is hyperdynamic. The right ventricular size is normal. There is moderately elevated pulmonary artery systolic pressure.  3. Decreased posterior leaflet motion due to ischemic tethering of the mitral valve leaflets.  4. The mitral valve is abnormal. Moderate to severe mitral valve regurgitation.  5. The aortic valve is tricuspid. Aortic valve regurgitation is not visualized. Mild aortic  valve sclerosis is present, with no evidence of aortic valve stenosis.  6. The inferior vena cava is normal in size with greater than 50% respiratory variability, suggesting right atrial pressure of 3 mmHg.   4/1 CT chest w/o >>Large bilateral pleural effusions with associated atelectasis.Moderate to severe bilateral ground-glass opacities, likely reflecting pneumonia and/or edema.  4/1 RHC >> RA = 18 RV = 60/20  PA = 63/29 (42) PCW = 33 Fick cardiac output/index = 3.7/1.9 PVR = 2.5 WU FA sat = 98% PA sat = 47%, 49% PaPi = 2.2  4/2 CXR>bilateral chest tubes present. Significantly improved since morning xray. Infiltrate vs atelectasis at right base.  04/12/21CXR>>vascular congestion and extensive bilateral airspace disease, probably reflecting edema  04/29/19 CXR>>1. New left subclavian dialysis catheter without complicating feature. 2. Interval removal of the left-sided chest tube. No pneumothorax. 3. Unchanged pulmonary edema.  05/01/2019 CXR> Radiographically appropriate position of endotracheal tube. CULTURES:  3/30 MRSA PCR >> neg 4/1 MRSA PCR >> neg 4/2 resp culture>>rare gram pos cocci, few candida albicans 4/3 Urine culture>>negative 4/4 blood cultures>>no growth  4/9 Surgical wound>> rare enterococcus faecalis 4/9 BCx2>>no gorwth 4/9 Urine Culture>> greater than 100k yeast  ANTIBIOTICS:  PTA ceftriaxone/ doxycycline >> 3/29 Ceftriaxone 4/4-4/5 Cefepime 4/4>>4/11 vanc 4/4>>4/6 Rocephin 4/4>>4/5 Cefuroxime 4/6-4/7 Ampicillin 4/11 Vancomycin 4/12>>4/13 Cefepime 4/12>>4/13 Augmentin 4/15>  LINES/TUBES:  4/15 ET tube> 4/9 Cortrak> 4/6 Rt IJ> 4/10 HD cath - 4/13 4/12 foley cath> 3/30 PICC> 4/13 HD cath> CONSULTANTS:  TCTS PCCM HF SUBJECTIVE:  Patient re-intubated overnight. Currently sedated with increased lower extremity edema.   CONSTITUTIONAL: BP (!) 103/59   Pulse 83   Temp 97.9 F (36.6 C) (Other (Comment))   Resp Marland Kitchen)  22   Ht 5'  11" (1.803 m) Comment: measured x 3  Wt 67.8 kg   SpO2 100%   BMI 20.85 kg/m   I/O last 3 completed shifts: In: 4542.1 [I.V.:2014.7; NG/GT:2051.7; IV Piggyback:475.7] Out: 7591 [Urine:5; MBWGY:6599; Chest Tube:75]  CVP:  [10 mmHg-13 mmHg] 10 mmHg  Vent Mode: PRVC FiO2 (%):  [40 %-80 %] 70 % Set Rate:  [12 bmp-23 bmp] 23 bmp Vt Set:  [450 mL] 450 mL PEEP:  [5 cmH20-10 cmH20] 10 cmH20 Pressure Support:  [5 cmH20] 5 cmH20 Plateau Pressure:  [25 cmH20] 25 cmH20  PHYSICAL EXAM: Physical Exam  Constitutional: No distress.  HENT:  Head: Normocephalic and atraumatic.  Cardiovascular: Normal rate and intact distal pulses.  Pulmonary/Chest: He is in respiratory distress. He has rales.  Abdominal: Soft. He exhibits no distension.  Musculoskeletal:        General: Edema present.  Neurological:  sedated  Skin: Skin is warm. He is diaphoretic.    RESOLVED PROBLEM LIST   ASSESSMENT AND PLAN   Acute hypoxic respiratory failure - Failed SBT, re-intubated overnight. Will likely need trach once able to wean down pressors. -Titrate vent settings to maintain SPO2 greater than or equal to 90%, HOB elevated to 30 degrees -Continue to maintain plateau pressures less than 30 cm H2O -Continue bronchial hygiene and RT/bronchodilator protocol - Patient had a single episode of emesis on BiPAP, CXR is unchaged from previous. - BAL grew rare enterococcus, completed 5 days of vancomycin - Start Augmentin today for 7 days aspiration pneumonia, in the setting of elevated WBC.   HFrEF, cardiogenic shock, NSTEMI with three-vessel disease, NSVT - Had an episode of monomorphic, pulseless VT last night requiring Epi and CPR for ROSC.  - titrated up vaso to 0.04 and NE to 35, wean when possible - On CRRT -Continue amiodarone gtt per primary team.  Bilateral pleural effusions -Continue suction  AKI Stable -Continue CRRT per nephrology  Diabetes mellitus -SSI -Continue Lantus 8 units twice  daily  SUMMARY OF TODAY'S PLAN:  Try to wean pressors, start Augmentin for aspiration, tracheostomy tomorrow.  Best Practice / Goals of Care / Disposition.   Diet: npo. Pain/Anxiety/Delirium protocol (if indicated):Fentanyl VAP protocol (if indicated):HOB 30 degrees, daily SBT DVT prophylaxis: heparin  GI prophylaxis: PPI Glucose control:SSI Mobility: BR Code Status: Full  Family Communication:will reach out to family today. Disposition: ICU  LABS  Glucose Recent Labs  Lab 04/30/19 0731 04/30/19 1137 04/30/19 1553 04/30/19 2001 04/30/19 2309 05/01/19 0443  GLUCAP 139* 142* 119* 188* 166* 120*    BMET Recent Labs  Lab 04/30/19 0436 04/30/19 1116 04/30/19 1600 04/30/19 2311 05/01/19 0143 05/01/19 0445 05/01/19 0449  NA 136   < > 137   < > 137 135 136  K 4.9   < > 4.4   < > 4.7 5.7* 4.8  CL 102  --  104  --   --   --  102  CO2 23  --  25  --   --   --  24  BUN 28*  --  27*  --   --   --  29*  CREATININE 1.54*  --  1.43*  --   --   --  1.58*  GLUCOSE 134*  --  131*  --   --   --  135*   < > = values in this interval not displayed.    Liver Enzymes Recent Labs  Lab 04/29/19 0444 04/29/19 1644 04/30/19 0436  04/30/19 1600 05/01/19 0449  AST 167*  --  180*  --  301*  ALT 242*  --  198*  --  231*  ALKPHOS 499*  --  488*  --  501*  BILITOT 1.0  --  1.0  --  0.8  ALBUMIN 1.5*  1.5*   < > 1.3*  1.4* 1.4* 1.4*  1.4*   < > = values in this interval not displayed.    Electrolytes Recent Labs  Lab 04/29/19 0444 04/29/19 1644 04/30/19 0436 04/30/19 1600 05/01/19 0449  CALCIUM 7.2*   < > 7.3* 7.3* 7.3*  MG 2.5*  --  2.5*  --  2.6*  PHOS 1.6*   < > 1.5* 1.2* 2.1*   < > = values in this interval not displayed.    CBC Recent Labs  Lab 04/29/19 0444 04/29/19 0656 04/30/19 0436 04/30/19 1116 05/01/19 0143 05/01/19 0445 05/01/19 0449  WBC 10.9*  --  10.8*  --   --   --  16.6*  HGB 9.2*   < > 7.9*   < > 10.2* 9.9* 9.3*  HCT 27.6*   < > 24.4*    < > 30.0* 29.0* 28.3*  PLT 170  --  211  --   --   --  212   < > = values in this interval not displayed.    ABG Recent Labs  Lab 04/30/19 2311 05/01/19 0143 05/01/19 0445  PHART 7.492* 7.226* 7.432  PCO2ART 33.4 70.3* 42.9  PO2ART 41.0* 142.0* 104.0    Coag's Recent Labs  Lab 04/29/19 0444 04/30/19 0436 05/01/19 0449  APTT 42* 39* 42*    Sepsis Markers Recent Labs  Lab 04/27/19 0906  PROCALCITON 6.93    Cardiac Enzymes No results for input(s): TROPONINI, PROBNP in the last 168 hours.  PAST MEDICAL HISTORY :   He  has a past medical history of Cholecystitis (01/6943), Complication of anesthesia, Diabetes mellitus without complication ([place]), Erectile dysfunction (2010), HTN (hypertension), NSTEMI (non-ST elevated myocardial infarction) ([place]) (03/24/2019), and PONV (postoperative nausea and vomiting).  PAST SURGICAL HISTORY:  He  has a past surgical history that includes Appendectomy (1969); Finger surgery (Left, 2004); Cholecystectomy; ERCP (N/A, 04/02/2013); Cholecystectomy (N/A, 04/02/2013); RIGHT/LEFT HEART CATH AND CORONARY ANGIOGRAPHY (N/A, 03/19/2019); RIGHT HEART CATH (N/A, 04/30/2019); IABP Insertion (N/A, 04/27/2019); Placement of impella left ventricular assist device (Right, 04/29/2019); TEE without cardioversion (N/A, 04/24/2019); Coronary artery bypass graft (N/A, 05/04/2019); Mitral valve replacement (N/A, 05/15/2019); Removal of impella left ventricular assist device (N/A, 04/26/2019); TEE without cardioversion (N/A, 05/14/2019); and Bronchial washings (N/A, 05/01/2019).  Allergies  Allergen Reactions  . Acetaminophen Itching    No current facility-administered medications on file prior to encounter.   Current Outpatient Medications on File Prior to Encounter  Medication Sig  . Insulin Glargine (BASAGLAR KWIKPEN) 100 UNIT/ML Inject 28 Units into the skin at bedtime.  . insulin lispro (HUMALOG) 100 UNIT/ML injection Inject 8 Units into the skin 2 (two) times daily before a  meal.  . metFORMIN (GLUCOPHAGE) 500 MG tablet Take 1 tablet (500 mg total) by mouth 2 (two) times daily with a meal.  . glucose blood (RELION GLUCOSE TEST STRIPS) test strip Use as instructed  . Insulin Pen Needle (PEN NEEDLES) 32G X 4 MM MISC   . ReliOn Ultra Thin Lancets MISC Use to test blood sugar up to 4 times daily.    FAMILY HISTORY:   His family history includes Colon cancer in his maternal aunt; Diabetes in his maternal  grandmother; Stroke in his maternal grandmother.  SOCIAL HISTORY:  He  reports that he has never smoked. His smokeless tobacco use includes snuff and chew. He reports previous alcohol use. He reports that he does not use drugs.  REVIEW OF SYSTEMS:    None other than what is noted on subjective.

## 2019-05-01 NOTE — Progress Notes (Signed)
Paged to code blue. Responded and patient was returning to baseline. No interventions were initiated by me at the time of arrival. CCM at bedside.

## 2019-05-01 NOTE — Progress Notes (Signed)
Pharmacy Antibiotic Note  Brendan [name] is a 64 y.o. male s/p CABG/MVR 4/6, on CRRT.  Pharmacy has been consulted for Unasyn dosing after VT arrest with aspiration event following Enterococcus found in BAL culture. Plan for 7 days of antibiotics following aspiration event. Patient was given vancomycin 750 mg IV x 1 this AM. Will continue Unasyn for 6 more days starting tomorrow.  Plan: Unasyn 3 g IV q8h through 4/21 Monitor CRRT tolerance/interruptions, clinical progress   Height: _0  (180.3 cm)(measured x 3) Weight: 67.8 kg (149 lb 7.6 oz) IBW/kg (Calculated) : 75.3  Temp (24hrs), Avg:98.1 F (36.7 C), Min:97.4 F (36.3 C), Max:98.5 F (36.9 C)  Recent Labs  Lab 04/27/19 0944 04/27/19 1535 04/28/19 0411 04/28/19 1622 04/29/19 0444 04/29/19 1644 04/30/19 0436 04/30/19 1600 05/01/19 0449  WBC 16.2*  --  13.7*  --  10.9*  --  10.8*  --  16.6*  CREATININE  --    < > 1.84*   < > 1.63* 1.58* 1.54* 1.43* 1.58*   < > = values in this interval not displayed.    Estimated Creatinine Clearance: 47.1 mL/min (A) (by C-G formula based on SCr of 1.58 mg/dL (H)).    Allergies  Allergen Reactions  . Acetaminophen Itching   Cefepime 4/4>4/14 Vanc 4/4>4/4; resume 4/12 > 4/15 Rocephin 4/4 > 4/6 Ampicillin 4/12>4/12 Unasyn 4/16 > (4/21)  4/2 R pleural fluid > neg 4/4 BCx x 2 > neg 4/9 BCx - negative 4/9 Wound Cx > rare enterococcus faecalis (pan-S) 4/9 UCx >  > 100K yeast 4/19 BAL - rare E.faecalis (pan sensitive) 4/12 TA > neg  Vertis Kelch, PharmD, [facility] PGY2 Cardiology Pharmacy Resident Phone [phone] 05/01/2019       9:14 AM  Please check [email or web address] for unit-specific pharmacist phone numbers

## 2019-05-01 NOTE — Progress Notes (Signed)
[facility] Progress Note Patient Name: Brendan [name] DOB: [birth year] MRN: [id number]   Date of Service  05/01/2019  HPI/Events of Note  ABG on 80%/PVC 18/TV 450/P 10 = 7.22/71/146/25  eICU Interventions  Will order: 1. Increase PRVC rate to 23.  2. Repeat ABG at 5 AM.      Intervention Category Major Interventions: Respiratory failure - evaluation and management;Acid-Base disturbance - evaluation and management  Brendan [name] 05/01/2019, 2:12 AM

## 2019-05-01 NOTE — Progress Notes (Signed)
      [address]       [place] [address]             [phone]      POD # 9 CABG, mitral repair, POD # 6 removal of impella  Intubated, sedated  BP (!) 97/47   Pulse 82   Temp 99.1 F (37.3 C) (Oral)   Resp 20   Ht 5\' 11"  (1.803 m) Comment: measured x 3  Wt 67.8 kg   SpO2 98%   BMI 20.85 kg/m  On IV amiodarone Milrinone 0.125, Norepi at 28 (down from 45), vasopressin 0.04  PRVC 23/ 40% /8 PEEP  On CVVHD  CBG mildly elevated  Lytes Ok  Severe protein calorie malnutrition with albumin 1.4  Karynn Deblasi C. Roxan Hockey, MD [facility] [phone]

## 2019-05-01 NOTE — Progress Notes (Signed)
Patient ID: Brendan [name], male   DOB: October 04, 1955, 64 y.o.   MRN: [id number]     Advanced Heart Failure Rounding Note  PCP-Cardiologist: No primary care provider on file.   Subjective:    Events - Presented to [facility] with acute HF  EF 20-25% - Transferred to con - Cath 3/30 with severe 3 vessel disease. EF 25% - Developed PMVT on milrinone -> milrinone stopped -> developed worsening shock -> IABP placed on 4/1 - Clinical deterioration 4/1-> intubated - 4/2 underwent Impella 5.5 placement earlier and bilateral chest tubes with 3L out from each side.  - Extubated 4/4 - 05/07/2019 CABG and MVR - Extubated 4/7 - Back to OR for Impella 5.5 extraction 4/9, extubated.  TEE with EF 35%, RV ok, trivial MR s/p MV repair.  - CVVH started 4/10 with oliguria, rising creatinine and CVP.  - Deteriorating respiratory status, intubated again early am 4/12.  Enterococcus faecalis in sputum.  - Extubated 4/14.  Developed respiratory distress and started on Bipap.  Had VT arrest with CPR/epinephrine, intubated again 4/15 early am.   Intubated on NE 34, vasopressin 0.04, milrinone 0.125, amiodarone 60.  MAP stable currently.  CVP 9-10 this morning on my measure, CVVH running essentially even.  Weight stable.  Co-ox 72%. No UOP.   He is able to follow commands on vent.   He is now on ampicillin with E faecalis in sputum, fluconazole with candida in urine.   Echo (4/10): EF 25-30%, normal RV, no significant MR.  I do not think there is an apical thrombus (think trabeculation).    Objective:   Weight Range: 67.8 kg Body mass index is 20.85 kg/m.   Vital Signs:   Temp:  [97.4 F (36.3 C)-98.5 F (36.9 C)] 98.3 F (36.8 C) (04/15 0824) Pulse Rate:  [82-102] 85 (04/15 0800) Resp:  [13-28] 18 (04/15 0800) BP: (80-117)/(43-59) 80/58 (04/15 0800) SpO2:  [79 %-100 %] 97 % (04/15 0800) Arterial Line BP: (74-168)/(35-62) 111/47 (04/15 0800) FiO2 (%):  [40 %-80 %] 40 % (04/15 0734) Weight:  [67.8 kg]  67.8 kg (04/15 0554) Last BM Date: 05/01/19  Weight change: Filed Weights   04/29/19 0500 04/30/19 0200 05/01/19 0554  Weight: 68.1 kg 67.8 kg 67.8 kg    Intake/Output:   Intake/Output Summary (Last 24 hours) at 05/01/2019 0924 Last data filed at 05/01/2019 0900 Gross per 24 hour  Intake 2971.21 ml  Output 2248 ml  Net 723.21 ml      Physical Exam   General: NAD, intubated Neck: No JVD, no thyromegaly or thyroid nodule.  Lungs: Crackles at bases.  CV: Nondisplaced PMI.  Heart regular S1/S2, no S3/S4, no murmur.  No peripheral edema.  Abdomen: Soft, nontender, no hepatosplenomegaly, no distention.  Skin: Intact without lesions or rashes.  Neurologic: Follows commands. Extremities: No clubbing or cyanosis.  HEENT: Normal.    Telemetry   NSR. Personally reviewed  Labs    CBC Recent Labs    04/30/19 0436 04/30/19 1116 05/01/19 0445 05/01/19 0449  WBC 10.8*  --   --  16.6*  HGB 7.9*   < > 9.9* 9.3*  HCT 24.4*   < > 29.0* 28.3*  MCV 86.2  --   --  87.1  PLT 211  --   --  212   < > = values in this interval not displayed.   Basic Metabolic Panel Recent Labs    04/30/19 0436 04/30/19 1116 04/30/19 1600 04/30/19 2311 05/01/19 0445 05/01/19 0449  NA 136   < > 137   < > 135 136  K 4.9   < > 4.4   < > 5.7* 4.8  CL 102  --  104  --   --  102  CO2 23  --  25  --   --  24  GLUCOSE 134*  --  131*  --   --  135*  BUN 28*  --  27*  --   --  29*  CREATININE 1.54*  --  1.43*  --   --  1.58*  CALCIUM 7.3*  --  7.3*  --   --  7.3*  MG 2.5*  --   --   --   --  2.6*  PHOS 1.5*  --  1.2*  --   --  2.1*   < > = values in this interval not displayed.   Liver Function Tests Recent Labs    04/30/19 0436 04/30/19 0436 04/30/19 1600 05/01/19 0449  AST 180*  --   --  301*  ALT 198*  --   --  231*  ALKPHOS 488*  --   --  501*  BILITOT 1.0  --   --  0.8  PROT 5.2*  --   --  5.1*  ALBUMIN 1.3*  1.4*   < > 1.4* 1.4*  1.4*   < > = values in this interval not  displayed.   No results for input(s): LIPASE, AMYLASE in the last 72 hours. Cardiac Enzymes No results for input(s): CKTOTAL, CKMB, CKMBINDEX, TROPONINI in the last 72 hours.  BNP: BNP (last 3 results) Recent Labs    04/05/2019 0113  BNP 655.7*    ProBNP (last 3 results) No results for input(s): PROBNP in the last 8760 hours.   D-Dimer No results for input(s): DDIMER in the last 72 hours. Hemoglobin A1C No results for input(s): HGBA1C in the last 72 hours. Fasting Lipid Panel No results for input(s): CHOL, HDL, LDLCALC, TRIG, CHOLHDL, LDLDIRECT in the last 72 hours. Thyroid Function Tests No results for input(s): TSH, T4TOTAL, T3FREE, THYROIDAB in the last 72 hours.  Invalid input(s): FREET3  Other results:   Imaging    DG CHEST PORT 1 VIEW  Result Date: 05/01/2019 CLINICAL DATA:  Intubation EXAM: PORTABLE CHEST 1 VIEW COMPARISON:  04/30/2019 FINDINGS: Support Apparatus: --Endotracheal tube: Tip at the level of the clavicular heads. --Enteric tube:Tip below the field of view --Catheter(s):Left subclavian catheter tip at the cavoatrial junction. Right PICC line tip also at the cavoatrial junction. --Other: Right chest tube has been removed. Right IJ catheter has been removed. Bibasilar opacities. No pleural effusion or pneumothorax visible. IMPRESSION: Radiographically appropriate position of endotracheal tube. Electronically Signed   By: Ulyses Jarred M.D.   On: 05/01/2019 01:01     Medications:     Scheduled Medications: . aspirin EC  325 mg Oral Daily   Or  . aspirin  324 mg Per Tube Daily  . B-complex with vitamin C  1 tablet Per Tube Daily  . bisacodyl  10 mg Oral Daily   Or  . bisacodyl  10 mg Rectal Daily  . chlorhexidine gluconate (MEDLINE KIT)  15 mL Mouth Rinse BID  . Chlorhexidine Gluconate Cloth  6 each Topical Daily  . docusate  200 mg Per Tube Daily  . feeding supplement (PRO-STAT SUGAR FREE 64)  30 mL Per Tube BID  . gabapentin  300 mg Per Tube Q8H    . insulin aspart  0-24 Units Subcutaneous Q4H  . insulin glargine  8 Units Subcutaneous BID  . mouth rinse  15 mL Mouth Rinse 10 times per day  . pantoprazole sodium  40 mg Per Tube Daily  . pneumococcal 23 valent vaccine  0.5 mL Intramuscular Tomorrow-1000  . polyethylene glycol  17 g Oral Daily  . rosuvastatin  10 mg Per Tube q1800  . sodium chloride flush  10-40 mL Intracatheter Q12H    Infusions: .  prismasol BGK 4/2.5 400 mL/hr at 05/01/19 0221  . sodium chloride Stopped (04/29/19 0928)  . sodium chloride    . sodium chloride 1 mL/hr at 05/01/19 0900  . sodium chloride 20 mL/hr at 05/01/19 0900  . sodium chloride    . amiodarone 60 mg/hr (05/01/19 0900)  . [START ON 05/07/2019] ampicillin-sulbactam (UNASYN) IV    . dexmedetomidine 0.5 mcg/kg/hr (05/01/19 0900)  . epinephrine Stopped (04/28/19 0138)  . feeding supplement (VITAL AF 1.2 CAL) 50 mL/hr at 05/01/19 0900  . fentaNYL infusion INTRAVENOUS Stopped (04/30/19 1211)  . heparin 10,000 units/ 20 mL infusion syringe 1,000 Units/hr (05/01/19 0430)  . lactated ringers Stopped (04/23/2019 1640)  . lactated ringers Stopped (04/30/19 0930)  . milrinone 0.125 mcg/kg/min (05/01/19 0900)  . norepinephrine (LEVOPHED) Adult infusion 40 mcg/min (05/01/19 0900)  . prismasol BGK 0/2.5 200 mL/hr at 04/30/19 2035  . prismasol BGK 4/2.5 1,800 mL/hr at 05/01/19 0826  . vasopressin (PITRESSIN) infusion - *FOR SHOCK* 0.04 Units/min (05/01/19 0900)    PRN Medications: sodium chloride, Place/Maintain arterial line **AND** sodium chloride, alteplase, fentaNYL, fentaNYL (SUBLIMAZE) injection, fentaNYL (SUBLIMAZE) injection, heparin, levalbuterol, metoprolol tartrate, midazolam, ondansetron (ZOFRAN) IV, ondansetron (ZOFRAN) IV, sodium chloride, sodium chloride flush, sorbitol     Assessment/Plan    1.Acute systolic HF ->  Cardiogenic Shock  - Due to iCM. EF 20-25% - Impella 5.5 placed on 4/2.   - s/p CABG/MVRepair on 4/6  - Impella out  4/9.  - Echo 4/10 with EF 25-30%, normal RV, no MR.   - Now on CVVH with volume overload/progressive renal failure. CVP 10 today, will continue CVVH aiming to keep gently negative today. We are close to pre-op weight.  - Co-ox 71.5% on norepinephrine 34 + vasopressin 0.04 + milrinone 0.125.  MAP stable.  Wean NE as able today.     2. CAD - LHC with severe 3 vessel CAD  - s/p CABG x 3 MV Repair 4/6  - on ASA/Crestor  3. Acute hypoxic respiratory failure - Extubated 4/9.  - CXR with extensive pulmonary edema and ?PNA component, re-intubated on 4/12.   - CVP now down with CVVH.  - Extubation 4/14, failed with respiratory distress.  - He will need tracheostomy, plan for tomorrow.    4. Mitral Regurgitation - Mod-severe on ECHO - s/p MV repair, TEE 4/9 with minimal MR s/p repair.   - Echo 4/10 with no significant MR.   5. Uncontrolled DM - Hgb A1C 9.  - On insulin and sliding scale.   6. AKI - due to shock - Became oliguric and now on CVVH to control volume overload.  Renal following.   7. Acute blood loss Anemia  - Stable hgb.    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   9. 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 No sensation R and L foot and occasionaly in his hands. Requires assistance with ADLs.   10. Severe Malnutrition - Prealbumin 8.9  - Nutrition on  board - Continue TFs  11. ID - Enterococcus faecalis in sputum => Ampicillin - Candida in urine => fluconazole x 7 days.   12. Elevated LFTs - Suspect shock liver, trended down but stable today.   CRITICAL CARE Performed by: Loralie Champagne  Total critical care time:40 minutes  Critical care time was exclusive of separately billable procedures and treating other patients.  Critical care was necessary to treat or prevent imminent or life-threatening deterioration.  Critical care was time spent personally by me (independent of midlevel providers or  residents) on the following activities: development of treatment plan with patient and/or surrogate as well as nursing, discussions with consultants, evaluation of patient's response to treatment, examination of patient, obtaining history from patient or surrogate, ordering and performing treatments and interventions, ordering and review of laboratory studies, ordering and review of radiographic studies, pulse oximetry and re-evaluation of patient's condition.   Length of Stay: 24  Loralie Champagne, MD  05/01/2019, 9:24 AM  Advanced Heart Failure Team Pager [phone] (M-F; 7a - 4p)  Please contact [facility] for night-coverage after hours (4p -7a ) and weekends on [email or web address]

## 2019-05-01 NOTE — Progress Notes (Signed)
Pharmacy Antibiotic Note  Brendan [name] is a 64 y.o. male s/p CABG/MVR 4/6, on CRRT.  Pharmacy has been consulted to resume vancomycin for Enterococcus found in BAL culture.  Today is day #4 of therapy.  Afebrile, WBC 16.6.  Plan: Vanc 773m IV Q24H, abx through 4/18 Monitor CRRT tolerance/interruptions, clinical progress   Height: _0  (180.3 cm)(measured x 3) Weight: 67.8 kg (149 lb 7.6 oz) IBW/kg (Calculated) : 75.3  Temp (24hrs), Avg:98.1 F (36.7 C), Min:97.4 F (36.3 C), Max:98.5 F (36.9 C)  Recent Labs  Lab 04/27/19 0944 04/27/19 1535 04/28/19 0411 04/28/19 1622 04/29/19 0444 04/29/19 1644 04/30/19 0436 04/30/19 1600 05/01/19 0449  WBC 16.2*  --  13.7*  --  10.9*  --  10.8*  --  16.6*  CREATININE  --    < > 1.84*   < > 1.63* 1.58* 1.54* 1.43* 1.58*   < > = values in this interval not displayed.    Estimated Creatinine Clearance: 47.1 mL/min (A) (by C-G formula based on SCr of 1.58 mg/dL (H)).    Allergies  Allergen Reactions  . Acetaminophen Itching   Cefepime 4/4>4/14 Vanc 4/4>4/4; resume 4/12 >  Rocephin 4/4 > 4/6 Ampicillin 4/12>4/12  4/2 R pleural fluid > neg 4/4 BCx x 2 > neg 4/9 BCx - negative 4/9 Wound Cx > rare enterococcus faecalis (pan-S) 4/9 UCx >  > 100K yeast 4/19 BAL - rare E.faecalis (pan sensitive) 4/12 TA > ngtd  Brendan [name] D. DMina Marble PharmD, BCPS, BStella4/15/2021, 6:35 AM

## 2019-05-02 ENCOUNTER — Inpatient Hospital Stay (HOSPITAL_COMMUNITY): Payer: Medicaid Other | Admitting: Anesthesiology

## 2019-05-02 ENCOUNTER — Inpatient Hospital Stay (HOSPITAL_COMMUNITY): Payer: Medicaid Other

## 2019-05-02 ENCOUNTER — Encounter (HOSPITAL_COMMUNITY)
Admission: AD | Disposition: E | Payer: Self-pay | Source: Other Acute Inpatient Hospital | Attending: Cardiothoracic Surgery

## 2019-05-02 ENCOUNTER — Encounter (HOSPITAL_COMMUNITY): Payer: Self-pay | Admitting: Cardiothoracic Surgery

## 2019-05-02 DIAGNOSIS — J9690 Respiratory failure, unspecified, unspecified whether with hypoxia or hypercapnia: Secondary | ICD-10-CM

## 2019-05-02 DIAGNOSIS — Z978 Presence of other specified devices: Secondary | ICD-10-CM

## 2019-05-02 DIAGNOSIS — Z951 Presence of aortocoronary bypass graft: Secondary | ICD-10-CM

## 2019-05-02 HISTORY — PX: TRACHEOSTOMY TUBE PLACEMENT: SHX814

## 2019-05-02 HISTORY — PX: VIDEO BRONCHOSCOPY: SHX5072

## 2019-05-02 LAB — CBC
HCT: 24.2 % — ABNORMAL LOW (ref 39.0–52.0)
HCT: 25.3 % — ABNORMAL LOW (ref 39.0–52.0)
Hemoglobin: 7.9 g/dL — ABNORMAL LOW (ref 13.0–17.0)
Hemoglobin: 8.4 g/dL — ABNORMAL LOW (ref 13.0–17.0)
MCH: 28.4 pg (ref 26.0–34.0)
MCH: 28.8 pg (ref 26.0–34.0)
MCHC: 32.6 g/dL (ref 30.0–36.0)
MCHC: 33.2 g/dL (ref 30.0–36.0)
MCV: 87.1 fL (ref 80.0–100.0)
MCV: 86.6 fL (ref 80.0–100.0)
Platelets: 187 10*3/uL (ref 150–400)
Platelets: 227 10*3/uL (ref 150–400)
RBC: 2.78 MIL/uL — ABNORMAL LOW (ref 4.22–5.81)
RBC: 2.92 MIL/uL — ABNORMAL LOW (ref 4.22–5.81)
RDW: 16.7 % — ABNORMAL HIGH (ref 11.5–15.5)
RDW: 17.2 % — ABNORMAL HIGH (ref 11.5–15.5)
WBC: 16.6 10*3/uL — ABNORMAL HIGH (ref 4.0–10.5)
WBC: 21.1 10*3/uL — ABNORMAL HIGH (ref 4.0–10.5)
nRBC: 0.2 % (ref 0.0–0.2)
nRBC: 0.1 % (ref 0.0–0.2)

## 2019-05-02 LAB — POCT I-STAT 7, (LYTES, BLD GAS, ICA,H+H)
Acid-Base Excess: 1 mmol/L (ref 0.0–2.0)
Bicarbonate: 25.3 mmol/L (ref 20.0–28.0)
Calcium, Ion: 1.03 mmol/L — ABNORMAL LOW (ref 1.15–1.40)
HCT: 28 % — ABNORMAL LOW (ref 39.0–52.0)
Hemoglobin: 9.5 g/dL — ABNORMAL LOW (ref 13.0–17.0)
O2 Saturation: 96 %
Patient temperature: 36.6
Potassium: 4.6 mmol/L (ref 3.5–5.1)
Sodium: 134 mmol/L — ABNORMAL LOW (ref 135–145)
TCO2: 26 mmol/L (ref 22–32)
pCO2 arterial: 38.9 mmHg (ref 32.0–48.0)
pH, Arterial: 7.419 (ref 7.350–7.450)
pO2, Arterial: 78 mmHg — ABNORMAL LOW (ref 83.0–108.0)

## 2019-05-02 LAB — BLOOD GAS, ARTERIAL
Acid-Base Excess: 2.2 mmol/L — ABNORMAL HIGH (ref 0.0–2.0)
Bicarbonate: 26.3 mmol/L (ref 20.0–28.0)
Drawn by: 164
FIO2: 40
O2 Saturation: 99.1 %
Patient temperature: 36.3
pCO2 arterial: 40.4 mmHg (ref 32.0–48.0)
pH, Arterial: 7.426 (ref 7.350–7.450)
pO2, Arterial: 172 mmHg — ABNORMAL HIGH (ref 83.0–108.0)

## 2019-05-02 LAB — RENAL FUNCTION PANEL
Albumin: 1.4 g/dL — ABNORMAL LOW (ref 3.5–5.0)
Albumin: 1.4 g/dL — ABNORMAL LOW (ref 3.5–5.0)
Anion gap: 11 (ref 5–15)
Anion gap: 9 (ref 5–15)
BUN: 26 mg/dL — ABNORMAL HIGH (ref 8–23)
BUN: 29 mg/dL — ABNORMAL HIGH (ref 8–23)
CO2: 23 mmol/L (ref 22–32)
CO2: 23 mmol/L (ref 22–32)
Calcium: 7.1 mg/dL — ABNORMAL LOW (ref 8.9–10.3)
Calcium: 7 mg/dL — ABNORMAL LOW (ref 8.9–10.3)
Chloride: 101 mmol/L (ref 98–111)
Chloride: 101 mmol/L (ref 98–111)
Creatinine, Ser: 1.28 mg/dL — ABNORMAL HIGH (ref 0.61–1.24)
Creatinine, Ser: 1.49 mg/dL — ABNORMAL HIGH (ref 0.61–1.24)
GFR calc Af Amer: >60 mL/min (ref >60)
GFR calc Af Amer: 58 mL/min — ABNORMAL LOW (ref >60)
GFR calc non Af Amer: >60 mL/min (ref >60)
GFR calc non Af Amer: 50 mL/min — ABNORMAL LOW (ref >60)
Glucose, Bld: 188 mg/dL — ABNORMAL HIGH (ref 70–99)
Glucose, Bld: 240 mg/dL — ABNORMAL HIGH (ref 70–99)
Phosphorus: 1.3 mg/dL — ABNORMAL LOW (ref 2.5–4.6)
Phosphorus: 4.4 mg/dL (ref 2.5–4.6)
Potassium: 4 mmol/L (ref 3.5–5.1)
Potassium: 4.9 mmol/L (ref 3.5–5.1)
Sodium: 135 mmol/L (ref 135–145)
Sodium: 133 mmol/L — ABNORMAL LOW (ref 135–145)

## 2019-05-02 LAB — GLUCOSE, CAPILLARY
Glucose-Capillary: 260 mg/dL — ABNORMAL HIGH (ref 70–99)
Glucose-Capillary: 101 mg/dL — ABNORMAL HIGH (ref 70–99)
Glucose-Capillary: 22 mg/dL — CL (ref 70–99)
Glucose-Capillary: 212 mg/dL — ABNORMAL HIGH (ref 70–99)
Glucose-Capillary: 222 mg/dL — ABNORMAL HIGH (ref 70–99)
Glucose-Capillary: 232 mg/dL — ABNORMAL HIGH (ref 70–99)

## 2019-05-02 LAB — COOXEMETRY PANEL
Carboxyhemoglobin: 1.6 % — ABNORMAL HIGH (ref 0.5–1.5)
Methemoglobin: 1.2 % (ref 0.0–1.5)
O2 Saturation: 79.2 %
Total hemoglobin: 7.8 g/dL — ABNORMAL LOW (ref 12.0–16.0)

## 2019-05-02 LAB — APTT: aPTT: 59 seconds — ABNORMAL HIGH (ref 24–36)

## 2019-05-02 LAB — MAGNESIUM: Magnesium: 2.7 mg/dL — ABNORMAL HIGH (ref 1.7–2.4)

## 2019-05-02 SURGERY — BRONCHOSCOPY, VIDEO-ASSISTED
Anesthesia: General

## 2019-05-02 MED ORDER — ALBUMIN HUMAN 25 % IV SOLN
12.5000 g | Freq: Four times a day (QID) | INTRAVENOUS | Status: AC
  Administered 2019-05-02 – 2019-05-03 (×3): 12.5 g via INTRAVENOUS
  Filled 2019-05-02 (×3): qty 50

## 2019-05-02 MED ORDER — EPINEPHRINE PF 1 MG/ML IJ SOLN
INTRAMUSCULAR | Status: AC
  Filled 2019-05-02: qty 1

## 2019-05-02 MED ORDER — VASOPRESSIN 20 UNIT/ML IV SOLN
0.0000 [IU]/min | INTRAVENOUS | Status: DC
  Administered 2019-05-02: 0.02 [IU]/min via INTRAVENOUS
  Administered 2019-05-03 – 2019-05-05 (×3): 0.03 [IU]/min via INTRAVENOUS
  Administered 2019-05-06 – 2019-05-09 (×3): 0.02 [IU]/min via INTRAVENOUS
  Administered 2019-05-10 – 2019-05-11 (×2): 0.01 [IU]/min via INTRAVENOUS
  Administered 2019-05-11 – 2019-05-15 (×4): 0.02 [IU]/min via INTRAVENOUS
  Filled 2019-05-02 (×15): qty 2

## 2019-05-02 MED ORDER — AMIODARONE IV BOLUS ONLY 150 MG/100ML
150.0000 mg | Freq: Once | INTRAVENOUS | Status: AC
  Administered 2019-05-02: 150 mg via INTRAVENOUS
  Filled 2019-05-02: qty 100

## 2019-05-02 MED ORDER — SODIUM CHLORIDE 0.9 % IV SOLN: INTRAVENOUS | Status: DC | PRN

## 2019-05-02 MED ORDER — SODIUM PHOSPHATES 45 MMOLE/15ML IV SOLN
30.0000 mmol | Freq: Once | INTRAVENOUS | Status: AC
  Administered 2019-05-02: 30 mmol via INTRAVENOUS
  Filled 2019-05-02: qty 10

## 2019-05-02 MED ORDER — VITAL 1.5 CAL PO LIQD
1000.0000 mL | ORAL | Status: DC
  Administered 2019-05-02 – 2019-05-05 (×4): 1000 mL
  Filled 2019-05-02 (×5): qty 1000

## 2019-05-02 MED ORDER — ROCURONIUM BROMIDE 10 MG/ML (PF) SYRINGE
PREFILLED_SYRINGE | INTRAVENOUS | Status: DC | PRN
  Administered 2019-05-02: 100 mg via INTRAVENOUS

## 2019-05-02 MED ORDER — LIDOCAINE-EPINEPHRINE 1 %-1:100000 IJ SOLN
INTRAMUSCULAR | Status: AC
  Filled 2019-05-02: qty 1

## 2019-05-02 MED ORDER — PRISMASOL BGK 4/2.5 32-4-2.5 MEQ/L REPLACEMENT SOLN: Status: DC

## 2019-05-02 MED ORDER — MIDAZOLAM HCL 2 MG/2ML IJ SOLN
INTRAMUSCULAR | Status: AC
  Filled 2019-05-02: qty 4

## 2019-05-02 MED ORDER — INSULIN ASPART 100 UNIT/ML ~~LOC~~ SOLN
4.0000 [IU] | SUBCUTANEOUS | Status: DC
  Administered 2019-05-02 – 2019-05-12 (×50): 4 [IU] via SUBCUTANEOUS

## 2019-05-02 MED ORDER — MIDODRINE HCL 5 MG PO TABS
5.0000 mg | ORAL_TABLET | Freq: Three times a day (TID) | ORAL | Status: DC
  Administered 2019-05-02 (×2): 5 mg
  Filled 2019-05-02 (×2): qty 1

## 2019-05-02 MED ORDER — FENTANYL CITRATE (PF) 250 MCG/5ML IJ SOLN
INTRAMUSCULAR | Status: AC
  Filled 2019-05-02: qty 5

## 2019-05-02 MED ORDER — 0.9 % SODIUM CHLORIDE (POUR BTL) OPTIME
TOPICAL | Status: DC | PRN
  Administered 2019-05-02: 12:00:00 1000 mL

## 2019-05-02 MED ORDER — ENOXAPARIN SODIUM 30 MG/0.3ML ~~LOC~~ SOLN
30.0000 mg | SUBCUTANEOUS | Status: DC
  Filled 2019-05-02: qty 0.3

## 2019-05-02 MED ORDER — MIDAZOLAM HCL 2 MG/2ML IJ SOLN
INTRAMUSCULAR | Status: DC | PRN
  Administered 2019-05-02: 4 mg via INTRAVENOUS

## 2019-05-02 MED ORDER — PROPOFOL 10 MG/ML IV BOLUS
INTRAVENOUS | Status: AC
  Filled 2019-05-02: qty 20

## 2019-05-02 MED ORDER — LIDOCAINE HCL (PF) 1 % IJ SOLN
INTRAMUSCULAR | Status: AC
  Filled 2019-05-02: qty 30

## 2019-05-02 MED ORDER — DEXTROSE 10 % IV SOLN: INTRAVENOUS | Status: DC

## 2019-05-02 MED ORDER — DEXTROSE 50 % IV SOLN
INTRAVENOUS | Status: AC
  Administered 2019-05-02: 50 mL
  Filled 2019-05-02: qty 50

## 2019-05-02 MED FILL — Medication: Qty: 1 | Status: AC

## 2019-05-02 SURGICAL SUPPLY — 67 items
BALL CTTN LRG ABS STRL LF (GAUZE/BANDAGES/DRESSINGS)
BLADE CLIPPER SURG (BLADE) ×1 IMPLANT
BLADE SURG 10 STRL SS (BLADE) ×2 IMPLANT
BLADE SURG 11 STRL SS (BLADE) ×2 IMPLANT
BRUSH CYTOL CELLEBRITY 1.5X140 (MISCELLANEOUS) IMPLANT
CANISTER SUCT 3000ML PPV (MISCELLANEOUS) ×3 IMPLANT
CLIP VESOCCLUDE SM WIDE 6/CT (CLIP) ×2 IMPLANT
CNTNR URN SCR LID CUP LEK RST (MISCELLANEOUS) ×1 IMPLANT
CONT SPEC 4OZ STRL OR WHT (MISCELLANEOUS) ×3
COTTONBALL LRG STERILE PKG (GAUZE/BANDAGES/DRESSINGS) IMPLANT
COVER BACK TABLE 60X90IN (DRAPES) ×3 IMPLANT
COVER SURGICAL LIGHT HANDLE (MISCELLANEOUS) ×6 IMPLANT
DRAPE ORTHO SPLIT 77X108 STRL (DRAPES) ×3
DRAPE SURG ORHT 6 SPLT 77X108 (DRAPES) IMPLANT
DRSG AQUACEL AG ADV 3.5X14 (GAUZE/BANDAGES/DRESSINGS) ×3 IMPLANT
DRSG DRAWTEX TRACH 4X4 (GAUZE/BANDAGES/DRESSINGS) ×2 IMPLANT
ELECT BLADE 4.0 EZ CLEAN MEGAD (MISCELLANEOUS) ×3
ELECT CAUTERY BLADE 6.4 (BLADE) ×3 IMPLANT
ELECT REM PT RETURN 9FT ADLT (ELECTROSURGICAL) ×3
ELECTRODE BLDE 4.0 EZ CLN MEGD (MISCELLANEOUS) IMPLANT
ELECTRODE REM PT RTRN 9FT ADLT (ELECTROSURGICAL) ×1 IMPLANT
FORCEPS BIOP RJ4 1.8 (CUTTING FORCEPS) IMPLANT
GAUZE 4X4 16PLY RFD (DISPOSABLE) ×3 IMPLANT
GAUZE SPONGE 4X4 12PLY STRL (GAUZE/BANDAGES/DRESSINGS) ×3 IMPLANT
GLOVE BIO SURGEON STRL SZ7.5 (GLOVE) ×10 IMPLANT
GLOVE SS BIOGEL STRL SZ 7.5 (GLOVE) ×2 IMPLANT
GLOVE SUPERSENSE BIOGEL SZ 7.5 (GLOVE) ×6
GOWN STRL REUS W/ TWL LRG LVL3 (GOWN DISPOSABLE) ×2 IMPLANT
GOWN STRL REUS W/TWL LRG LVL3 (GOWN DISPOSABLE) ×12
HEMOSTAT SURGICEL 2X14 (HEMOSTASIS) IMPLANT
HOLDER TRACH TUBE VELCRO 19.5 (MISCELLANEOUS) ×5 IMPLANT
KIT BASIN OR (CUSTOM PROCEDURE TRAY) ×3 IMPLANT
KIT CLEAN ENDO COMPLIANCE (KITS) ×3 IMPLANT
KIT SUCTION CATH 14FR (SUCTIONS) IMPLANT
KIT TURNOVER KIT B (KITS) ×3 IMPLANT
MARKER SKIN DUAL TIP RULER LAB (MISCELLANEOUS) ×5 IMPLANT
NDL BLUNT 18X1 FOR OR ONLY (NEEDLE) IMPLANT
NEEDLE 22X1 1/2 (OR ONLY) (NEEDLE) IMPLANT
NEEDLE BLUNT 18X1 FOR OR ONLY (NEEDLE) IMPLANT
NEEDLE HYPO 22GX1.5 SAFETY (NEEDLE) IMPLANT
NS IRRIG 1000ML POUR BTL (IV SOLUTION) ×3 IMPLANT
OIL SILICONE PENTAX (PARTS (SERVICE/REPAIRS)) ×3 IMPLANT
PACK EENT II TURBAN DRAPE (CUSTOM PROCEDURE TRAY) ×3 IMPLANT
PAD ARMBOARD 7.5X6 YLW CONV (MISCELLANEOUS) ×6 IMPLANT
PENCIL BUTTON HOLSTER BLD 10FT (ELECTRODE) ×3 IMPLANT
SOL ANTI FOG 6CC (MISCELLANEOUS) ×1 IMPLANT
SOLUTION ANTI FOG 6CC (MISCELLANEOUS) ×2
SPONGE DRAIN TRACH 4X4 STRL 2S (GAUZE/BANDAGES/DRESSINGS) ×3 IMPLANT
SUT PROLENE 3 0 RB 1 (SUTURE) ×2 IMPLANT
SUT PROLENE 3 0 SH DA (SUTURE) ×2 IMPLANT
SUT SILK 2 0 SH CR/8 (SUTURE) ×3 IMPLANT
SUT SILK 2 0 TIES 10X30 (SUTURE) ×3 IMPLANT
SUT SILK 3 0 TIES 10X30 (SUTURE) IMPLANT
SYR 10ML LL (SYRINGE) ×3 IMPLANT
SYR 20ML ECCENTRIC (SYRINGE) ×3 IMPLANT
SYR 5ML LUER SLIP (SYRINGE) ×1 IMPLANT
SYR BULB IRRIGATION 50ML (SYRINGE) ×3 IMPLANT
SYR CONTROL 10ML LL (SYRINGE) IMPLANT
TOWEL GREEN STERILE (TOWEL DISPOSABLE) ×3 IMPLANT
TOWEL GREEN STERILE FF (TOWEL DISPOSABLE) ×3 IMPLANT
TRAP SPECIMEN MUCOUS 40CC (MISCELLANEOUS) ×3 IMPLANT
TUBE CONNECTING 12'X1/4 (SUCTIONS) ×1
TUBE CONNECTING 12X1/4 (SUCTIONS) ×2 IMPLANT
TUBE CONNECTING 20'X1/4 (TUBING) ×2
TUBE CONNECTING 20X1/4 (TUBING) ×4 IMPLANT
TUBE TRACH SHILEY  6 DIST  CUF (TUBING) ×2 IMPLANT
WATER STERILE IRR 1000ML POUR (IV SOLUTION) ×3 IMPLANT

## 2019-05-02 NOTE — Op Note (Signed)
NAME: Brendan [name], MANDICH MEDICAL RECORD [id number] ACCOUNT [id number] DATE OF BIRTH:1955-02-28 FACILITY: [facility] LOCATION: [facility] PHYSICIAN:Daxen Lanum VAN TRIGT III, MD  OPERATIVE REPORT  DATE OF PROCEDURE:  04/23/2019  OPERATIONS: 1.  Video bronchoscopy. 2.  Tracheostomy with #6 Shiley trach.  PREOPERATIVE DIAGNOSES:   1.  History of ischemic cardiomyopathy with cardiogenic shock.   2.  Urgent coronary artery bypass graft. 3.  Combined mitral valve repair. 4.  Postoperative respiratory failure requiring reintubation.  POSTOPERATIVE DIAGNOSES:   1.  History of ischemic cardiomyopathy with cardiogenic shock.   2.  Urgent coronary artery bypass graft. 3.  Combined mitral valve repair. 4.  Postoperative respiratory failure requiring reintubation.  SURGEON:  Ivin Poot, MD  ASSISTANT:  Barnett Applebaum, RNFA.  ANESTHESIA:  General.  DESCRIPTION OF PROCEDURE:  The patient was brought directly from the intensive care unit to the operating room on the ventilator after informed consent for the procedure had been obtained from the patient's wife after discussing the procedure, benefits  and risks with her over the phone.  The patient was placed supine on the operating table and general anesthesia was induced.  The patient remained stable.  A proper time-out was performed.  Through the endotracheal tube, a fiberoptic bronchoscope was passed.  The distal trachea and carina were clear.  There were some secretions in the main stem bronchus of the right lung, which were cleared with irrigation and sent for culture.  There were  no endobronchial lesions noted in the bronchus intermedius or the segments of the right upper lobe, right middle lobe, right lower lobe.  The bronchoscope was then passed down the left main stem bronchus.  There were no endobronchial lesions or  secretions.  The endobronchial segments of the upper lobe and lower lobe were visualized without abnormality.  The bronchoscope was  withdrawn.  Next, the neck and chest were prepped and draped as a sterile field.  A proper time-out was performed.  A small incision was made in the suprasternal notch.  Electrocautery was used to expose the anterior surface of the trachea.  Incision was made  between the tracheal rings.  The tracheal rings were spread as the endotracheal tube was removed and the #6 Shiley tracheostomy appliance was inserted in the trachea and the cuff was inflated for securing the placement.  The ventilator circuit was then  applied to the tracheostomy and worked properly.  Four Prolene sutures were placed to secure the tracheostomy to the skin, as well as a Velcro neck strap.  The patient was then transported back to the ICU in stable condition on the ventilator.  VN/NUANCE  D:05/08/2019 T:05/05/2019 JOB:[id number]

## 2019-05-02 NOTE — Progress Notes (Signed)
Patient going in and out of Afib in the 130s. Prescott Gum, MD called and notified. Verbal orders to give 150mg  Amiodarone (Amio) bolus and turn Amio gtt up to 60mg / hour until 7am. Verbal order to turn pacer off and monitor heart rate. Will initiate orders and continue monitoring patient.

## 2019-05-02 NOTE — Progress Notes (Signed)
CT surgery p.m. Rounds  Patient comfortable on ventilator Tracheostomy performed earlier today, site without bleeding or drainage Removing 50 cc/h CVVH Tube feeds have resumed Continue on Unasyn for Enterococcus in sputum Sinus rhythm paced on IV amiodarone

## 2019-05-02 NOTE — Procedures (Signed)
Admit: 04/13/2019 LOS: 73  25M dialysis dependent AKI; cardiogenic shock status post CABG + MV replacement 4/2  Current CRRT Prescription: Start Date: 04/26/19 Catheter: L Cumming Temp HD Cath BFR: 200 Pre Blood Pump: 400 4K DFR: 1800 4K Replacement Rate: 200 0K Goal UF: Net even Anticoagulation: 1034m/h hep fixed Clotting: infrequent   S:  Remains open milrinone, norepinephrine, vasopressin  K4.0 this morning, phosphorus 1.3  To OR this AM for trach  Hypothermic, hypoglcyemic  O: 04/15 0701 - 04/16 0700 In: 4220 [I.V.:2826.9; NG/GT:950; IV Piggyback:443.2] Out: 3711 [Stool:100]  Filed Weights   04/30/19 0200 05/01/19 0554 05/01/2019 0400  Weight: 67.8 kg 67.8 kg 67.5 kg    Recent Labs  Lab 05/01/19 0449 05/01/19 0449 05/01/19 1547 05/01/19 1731 05/12/2019 0334  NA 136   < > 136 136 135  K 4.8   < > 5.0 4.9 4.0  CL 102  --  102  --  101  CO2 24  --  23  --  23  GLUCOSE 135*  --  161*  --  188*  BUN 29*  --  28*  --  26*  CREATININE 1.58*  --  1.46*  --  1.28*  CALCIUM 7.3*  --  7.7*  --  7.1*  PHOS 2.1*  --  1.3*  --  1.3*   < > = values in this interval not displayed.   Recent Labs  Lab 04/30/19 0436 04/30/19 1116 05/01/19 0449 05/01/19 1731 05/07/2019 0334  WBC 10.8*  --  16.6*  --  16.6*  HGB 7.9*   < > 9.3* 9.2* 7.9*  HCT 24.4*   < > 28.3* 27.0* 24.2*  MCV 86.2  --  87.1  --  87.1  PLT 211  --  212  --  187   < > = values in this interval not displayed.    Scheduled Meds: . aspirin EC  325 mg Oral Daily   Or  . aspirin  324 mg Per Tube Daily  . B-complex with vitamin C  1 tablet Per Tube Daily  . bisacodyl  10 mg Oral Daily   Or  . bisacodyl  10 mg Rectal Daily  . chlorhexidine gluconate (MEDLINE KIT)  15 mL Mouth Rinse BID  . Chlorhexidine Gluconate Cloth  6 each Topical Daily  . docusate  200 mg Per Tube Daily  . feeding supplement (PRO-STAT SUGAR FREE 64)  30 mL Per Tube BID  . gabapentin  300 mg Per Tube Q8H  . insulin aspart  0-24 Units  Subcutaneous Q4H  . insulin glargine  8 Units Subcutaneous BID  . mouth rinse  15 mL Mouth Rinse 10 times per day  . pantoprazole sodium  40 mg Per Tube Daily  . pneumococcal 23 valent vaccine  0.5 mL Intramuscular Tomorrow-1000  . polyethylene glycol  17 g Oral Daily  . rosuvastatin  10 mg Per Tube q1800  . sodium chloride flush  10-40 mL Intracatheter Q12H   Continuous Infusions: .  prismasol BGK 4/2.5 400 mL/hr at 05/11/2019 0441  . sodium chloride Stopped (04/29/19 0928)  . sodium chloride    . sodium chloride 1 mL/hr at 05/01/19 1900  . sodium chloride 20 mL/hr at 05/01/19 1900  . sodium chloride    . amiodarone 60 mg/hr (04/24/2019 0528)  . ampicillin-sulbactam (UNASYN) IV 3 g (05/01/2019 0738)  . dexmedetomidine 0.6 mcg/kg/hr (05/15/2019 0500)  . dextrose 75 mL/hr at 04/23/2019 0812  . epinephrine Stopped (04/28/19 0138)  .  feeding supplement (VITAL AF 1.2 CAL) Stopped (04/23/2019 0000)  . fentaNYL infusion INTRAVENOUS Stopped (04/30/19 1211)  . heparin 10,000 units/ 20 mL infusion syringe 1,000 Units/hr (05/01/19 1334)  . lactated ringers Stopped (04/27/2019 1640)  . lactated ringers Stopped (04/30/19 0930)  . milrinone 0.125 mcg/kg/min (05/07/2019 0500)  . norepinephrine (LEVOPHED) Adult infusion 23 mcg/min (05/01/2019 0516)  . prismasol BGK 0/2.5 200 mL/hr at 05/01/19 2219  . prismasol BGK 4/2.5 1,800 mL/hr at 04/19/2019 0808  . vasopressin (PITRESSIN) infusion - *FOR SHOCK* 0.04 Units/min (04/29/2019 0500)   PRN Meds:.sodium chloride, Place/Maintain arterial line **AND** sodium chloride, alteplase, fentaNYL, fentaNYL (SUBLIMAZE) injection, fentaNYL (SUBLIMAZE) injection, heparin, levalbuterol, metoprolol tartrate, midazolam, ondansetron (ZOFRAN) IV, ondansetron (ZOFRAN) IV, sodium chloride, sodium chloride flush, sorbitol  ABG    Component Value Date/Time   PHART 7.426 04/19/2019 0430   PCO2ART 40.4 04/30/2019 0430   PO2ART 172 (H) 05/08/2019 0430   HCO3 26.3 05/05/2019 0430   TCO2 28  05/01/2019 1731   ACIDBASEDEF 3.0 (H) 04/28/2019 0735   O2SAT 99.1 04/23/2019 0430    A/P  1. Dialysis dependent AKI, anuric 2. Hypophosphatemia, from CRRT, repleted this AM CTM 3. Enterococcus faecalis pneumonia; ampicillin 4. VDRF for trach 4/16 5. Cardiogenic shock 6. CAD status post CABG 7. Mitral regurgitation status post mitral valve repair 8. Uncontrolled diabetes mellitus 9. Acute blood loss anemia 10. Recurrent polymorphic ventricular tachycardia, on amiodarone  Change post fluid to 4K,  Cont hep in CRRT circuilt. Replete Phos this AM.  Follow CRRT labs. RS  Pearson Grippe, MD [employer] pgr [phone]

## 2019-05-02 NOTE — Progress Notes (Signed)
CRRT restarted at this time. Pt tolerating well.

## 2019-05-02 NOTE — Progress Notes (Signed)
Pt removed from CRRT at this time, Blood returned. RN saline recirculating. Pt to OR with OR team. RN will continue to monitor.

## 2019-05-02 NOTE — Anesthesia Postprocedure Evaluation (Signed)
Anesthesia Post Note  Patient: Brendan [name]  Procedure(s) Performed: VIDEO BRONCHOSCOPY USING DISPOSABLE ANESTHESIA SCOPE (N/A ) TRACHEOSTOMY (N/A )     Patient location during evaluation: SICU Anesthesia Type: General Level of consciousness: sedated Pain management: pain level controlled Vital Signs Assessment: post-procedure vital signs reviewed and stable Respiratory status: patient on ventilator - see flowsheet for VS (tracheostomy) Cardiovascular status: stable Postop Assessment: no apparent nausea or vomiting Anesthetic complications: no    Last Vitals:  Vitals:   05/10/2019 1211 05/07/2019 1300  BP: (!) 94/54 (!) 96/51  Pulse: 88 88  Resp: (!) 21 19  Temp: (!) 35.2 C (!) 35.5 C  SpO2: 99% 100%    Last Pain:  Vitals:   05/09/2019 1211  TempSrc: Core  PainSc:                  Heinrich Fertig S

## 2019-05-02 NOTE — Anesthesia Preprocedure Evaluation (Signed)
Anesthesia Evaluation  Patient identified by MRN, date of birth, ID band Patient unresponsive  General Assessment Comment:Sedated, intubated.  Reviewed: Allergy & Precautions, H&P , NPO status , Patient's Chart, lab work & pertinent test results  History of Anesthesia Complications (+) PONV and history of anesthetic complications  Airway Mallampati: Intubated       Dental   Pulmonary pneumonia,  Respiratory failure   + rhonchi        Cardiovascular hypertension, + CAD, + Past MI, + CABG and +CHF  + Valvular Problems/Murmurs  Rhythm:regular Rate:Normal  S/p MVrepair.    Neuro/Psych  Neuromuscular disease    GI/Hepatic   Endo/Other  diabetes, Type 2  Renal/GU Renal InsufficiencyRenal disease     Musculoskeletal   Abdominal   Peds  Hematology  (+) anemia ,   Anesthesia Other Findings   Reproductive/Obstetrics                             Anesthesia Physical Anesthesia Plan  ASA: IV  Anesthesia Plan: General   Post-op Pain Management:    Induction: Intravenous  PONV Risk Score and Plan: 3 and Ondansetron, Dexamethasone, Midazolam and Treatment may vary due to age or medical condition  Airway Management Planned: Oral ETT  Additional Equipment: Arterial line  Intra-op Plan:   Post-operative Plan: Post-operative intubation/ventilation  Informed Consent: I have reviewed the patients History and Physical, chart, labs and discussed the procedure including the risks, benefits and alternatives for the proposed anesthesia with the patient or authorized representative who has indicated his/her understanding and acceptance.       Plan Discussed with: CRNA, Anesthesiologist and Surgeon  Anesthesia Plan Comments:         Anesthesia Quick Evaluation

## 2019-05-02 NOTE — Progress Notes (Signed)
PULMONARY / CRITICAL CARE MEDICINE   NAME:  Brendan [name], MRN:  [id number], DOB:  July 08, 1955, LOS: 65 ADMISSION DATE:  04/13/2019, CONSULTATION DATE:  05/12/2019 REFERRING MD:  Dr. Haroldine Laws, CHIEF COMPLAINT:  Respiratory distress  BRIEF HISTORY:    89 yoM originally presented with SOB and fatigue at [facility] found to have new HFrEF, NSTEMI, and bilateral pleural effusions transferred to [facility] on 3/29 for further cardiac evaluation. 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 Started on lasix and milrinone gtts however developed NSVT. Was taken 4/1 for placement of IABP and swan for optimization prior to CABG. Was on precedex for agitation/ confusion, some concern for DTs. On the evening of 4/1, patient developed worsening respiratory distress and hypoxia, PCCM consulted for intubation and vent management.   He was successfully extubated on 4/4 and PCCM signed off. Impella was removed 4/11 and patient noted to have increased WOB throughout the day he was placed on Bipap, but mental status continued to worsen and PCCM consulted for possible re-intubation. HISTORY OF PRESENT ILLNESS   Brendan [name] is a 64 yo male withpoorly controlled diabetes complicated by neuropathy and hypertension who presented to an outside hospital on 3/26 with a chief complaint of general malaise and shortness of breath for the past 4 days. He is now transferred to [facility] for evaluation of troponin elevation, evaluation of newly depressed EF,and consideration for coronary angiography.  History obtained from patient report as well as discharge paperwork from outside hospital. Briefly, patient reportedly had nausea, vomiting, diarrhea approximately 1 week and is confined to his bed. He finally presented to the outside hospital ED he was found to have a blood sugar of 516, BUN of 53, creatinine 1.2, and anion gap of 17. Patient's BNP was elevated to 8900. Troponin was elevated to 13.1 with changes noted by the team of their lateral  leads as well as V1 and V2. Patient was started on aspirin and a heparin drip. Chest x-ray on admission was concerning for bilateral pulmonary opacities differential including infection and pulmonary edema. So he was started on empiric ceftriaxone and doxycycline, although his procalcitonin was negative. He had a chest wall echo that was performed that showed an EF of 25% was diuresed during his admission with improvement in his shortness of breath as well as radiographically. On discussion with patient he has been chest pain-free since his admission totheoutside hospital. In addition, he feels that his shortness of breath is much improved.the  Patient had left heart cath on 04/11/2019 showing severe three-vessel disease. 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 He then developed worsening cardiogenic shock and was intubated on 4/1 andplaced on a IABP on 4/02. He also had bilateral chest tubes placed with3 L removed from each side. He was then extubated on 4/4. On 4/6 patient had CABG/MVRperformed and was extubated on 4/7 with improved mentation with weaning of sedation and pressors. Patient had Impella removed on 4/9 and subsequent TEE showing an EF of 35%with trivial MR s/p MV repair. Remained on BiPAP at this time.On 4/10 nephrology was consulted due to anuric sinceCABG and started on CRRT.Developing worsening shortness of breath and increased work of breathing on BiPAP with hypoxia on ABG and subsequently reintubated on 4/12.  Patient was extubated on 4/14 and developed worsening hypoxia on BiPAP. Overnight he developed pulseless VT requiring 1 minute of CPR and epinephrine to achieve ROSC. Neurovascularly intact afterward. Subsequently had increased work of breathing and worsening respiratory distress requiring intubation. SIGNIFICANT  PAST MEDICAL HISTORY   Tobacco abuse, HTN, poorly controlled diabetes, diabetic  neuropathy  SIGNIFICANT EVENTS:  3/30 Lt heart cath/TEE performed 4/1 Intubated, RHC/IABP/PA cath 4/2 Impella placement 4/3 febrile over evening and night. Blood cultures obtained. Vanc/cefepime started. Hematuria--heparin held. 4/4 Extubated,abx transitioned to rocephin. Heparin restarted 4/6 Re-intubated for CABG/MVR 4/7 Extubated from CABG to BiPAP 4/9 Impella removed  4/9 TEE 4/10 CVC inserted &CRRT initiated  4/12 PCCMre-consulted re-intubation 4/14 Extubated to BiPAP 4/15 Re-intubated due to respiratory distress STUDIES:   3/30 R/ LHC >>  Ost LM to Mid LM lesion is 35% stenosed.  Prox LAD lesion is 90% stenosed.  Prox LAD to Mid LAD lesion is 50% stenosed.  Mid Cx lesion is 100% stenosed.  Prox RCA to Mid RCA lesion is 90% stenosed.  RPDA lesion is 95% stenosed.  LV end diastolic pressure is severely elevated.  Hemodynamic findings consistent with moderate pulmonary hypertension. 1. Severe 3 vessel obstructive CAD 2. High LV filling pressures 3. Reduced cardiac output with index 2.3 4. Moderate pulmonary HTN.  3/30TTE >> 1. Left ventricular ejection fraction, by estimation, is 30 to 35%. The left ventricle has moderately decreased function. The left ventricle demonstrates regional wall motion abnormalities.There is mild left ventricular hypertrophy. Left ventricular diastolic function could not be evaluated. There is severe hypokinesis of the left ventricular, entire inferior wall, inferoseptal wall, apical segment and lateral wall.  2. Right ventricular systolic function is hyperdynamic. The right ventricular size is normal. There is moderately elevated pulmonary artery systolic pressure.  3. Decreased posterior leaflet motion due to ischemic tethering of the mitral valve leaflets.  4. The mitral valve is abnormal. Moderate to severe mitral valve regurgitation.  5. The aortic valve is tricuspid. Aortic valve regurgitation is not visualized. Mild aortic  valve sclerosis is present, with no evidence of aortic valve stenosis.  6. The inferior vena cava is normal in size with greater than 50% respiratory variability, suggesting right atrial pressure of 3 mmHg.   4/1 CT chest w/o >>Large bilateral pleural effusions with associated atelectasis.Moderate to severe bilateral ground-glass opacities, likely reflecting pneumonia and/or edema.  4/1 RHC >> RA = 18 RV = 60/20  PA = 63/29 (42) PCW = 33 Fick cardiac output/index = 3.7/1.9 PVR = 2.5 WU FA sat = 98% PA sat = 47%, 49% PaPi = 2.2  4/2 CXR>bilateral chest tubes present. Significantly improved since morning xray. Infiltrate vs atelectasis at right base.  04/12/21CXR>>vascular congestion and extensive bilateral airspace disease, probably reflecting edema  04/29/19 CXR>>1. New left subclavian dialysis catheter without complicating feature. 2. Interval removal of the left-sided chest tube. No pneumothorax. 3. Unchanged pulmonary edema.  05/01/2019 CXR> Radiographically appropriate position of endotracheal tube. CULTURES:  3/30 MRSA PCR >> neg 4/1 MRSA PCR >> neg 4/2 resp culture>>rare gram pos cocci, few candida albicans 4/3 Urine culture>>negative 4/4 blood cultures>>no growth  4/9 Surgical wound>> rare enterococcus faecalis 4/9 BCx2>>no gorwth 4/9 Urine Culture>> greater than 100k yeast  ANTIBIOTICS:  PTA ceftriaxone/ doxycycline >> 3/29 Ceftriaxone 4/4-4/5 Cefepime 4/4>>4/11 vanc 4/4>>4/6 Rocephin 4/4>>4/5 Cefuroxime 4/6-4/7 Ampicillin 4/11 Vancomycin 4/12>>4/13 Cefepime 4/12>>4/13 Augmentin 4/15>  LINES/TUBES:  4/15 ET tube> 4/9 Cortrak> 4/6 Rt IJ> 4/10 HD cath- 4/13 4/12 foley cath> 3/30 PICC> 4/13 HD cath> CONSULTANTS:  TCTS PCCM HF SUBJECTIVE:  Patient resting comfortably in bed on precedex. Able to wean down levo last night some.  Otherwise, patient is stable from yesterday.    CONSTITUTIONAL: BP (!) 95/55 (BP Location: Left Arm)  Pulse  70   Temp 97.6 F (36.4 C) (Axillary)   Resp 12   Ht _0  (1.803 m) Comment: measured x 3  Wt 67.5 kg   SpO2 100%   BMI 20.75 kg/m   I/O last 3 completed shifts: In: 5158.6 [I.V.:2820.1; NG/GT:1885; IV Piggyback:453.5] Out: 7096 [Urine:10; Other:4381]  CVP:  [8 mmHg-11 mmHg] 8 mmHg  Vent Mode: PRVC FiO2 (%):  [40 %] 40 % Set Rate:  [23 bmp-28 bmp] 23 bmp Vt Set:  [450 mL] 450 mL PEEP:  [8 cmH20] 8 cmH20 Plateau Pressure:  [10 GEZ66-29 cmH20] 11 cmH20  PHYSICAL EXAM: Physical Exam  Constitutional: No distress.  HENT:  Head: Normocephalic and atraumatic.  Cardiovascular: Normal rate and intact distal pulses. Exam reveals no gallop and no friction rub.  No murmur heard. Pulmonary/Chest: He has rales.  On Mech vent.  Abdominal: Soft. He exhibits no distension.  Musculoskeletal:        General: Edema (minimal) present.  Skin: Skin is warm and dry. He is not diaphoretic.   RESOLVED PROBLEM LIST   ASSESSMENT AND PLAN   Acute hypoxic respiratory failure - Tracheostomy today -Titrate vent settings to maintain SPO2 greater than or equal to 90%, HOB elevated to 30 degrees -Continue to maintain plateau pressures less than 30 cm H2O -Continue bronchial hygiene and RT/bronchodilator protocol - Patient had a single episode of emesis on BiPAP, CXR is unchaged from previous. - BAL grew rare enterococcus, completed 5 days of vancomycin and stopped yesterday. - On day 2 of Augmentin, continue for 7 days.  HFrEF, cardiogenic shock, NSTEMI with three-vessel disease, NSVT - Had an episode of monomorphic, pulseless VT last night requiring Epi and CPR for ROSC.  - titrated up vaso to 0.04 and NE to 35, wean when possible - On CRRT - Continue amiodarone gtt per primary team.  AKI Cr improving at 1.28 from 1.46 yesterday. -Continue CRRT per nephrology - Continue Fluconazole for 7 days. - Low phos, replete today.  Diabetes mellitus -SSI -Continue Lantus 8 units twice  daily  SUMMARY OF TODAY'S PLAN:  Tracheostomy today.  Best Practice / Goals of Care / Disposition.   Diet: npo. Pain/Anxiety/Delirium protocol (if indicated):Fentanyl VAP protocol (if indicated):HOB 30 degrees, daily SBT DVT prophylaxis: heparin  GI prophylaxis: PPI Glucose control:SSI Mobility: BR Code Status: Full  Family Communication:will reach out to family today. Disposition:ICU  LABS  Glucose Recent Labs  Lab 05/01/19 0753 05/01/19 1144 05/01/19 1529 05/01/19 1939 05/01/19 2341 04/30/2019 0340  GLUCAP 112* 193* 150* 157* 214* 260*    BMET Recent Labs  Lab 05/01/19 0449 05/01/19 0449 05/01/19 1547 05/01/19 1731 04/27/2019 0334  NA 136   < > 136 136 135  K 4.8   < > 5.0 4.9 4.0  CL 102  --  102  --  101  CO2 24  --  23  --  23  BUN 29*  --  28*  --  26*  CREATININE 1.58*  --  1.46*  --  1.28*  GLUCOSE 135*  --  161*  --  188*   < > = values in this interval not displayed.    Liver Enzymes Recent Labs  Lab 04/29/19 0444 04/29/19 1644 04/30/19 0436 04/30/19 1600 05/01/19 0449 05/01/19 1547 04/23/2019 0334  AST 167*  --  180*  --  301*  --   --   ALT 242*  --  198*  --  231*  --   --   ALKPHOS  499*  --  488*  --  501*  --   --   BILITOT 1.0  --  1.0  --  0.8  --   --   ALBUMIN 1.5*  1.5*   < > 1.3*  1.4*   < > 1.4*  1.4* 1.4* 1.4*   < > = values in this interval not displayed.    Electrolytes Recent Labs  Lab 04/30/19 0436 04/30/19 1600 05/01/19 0449 05/01/19 1547 05/16/2019 0334  CALCIUM 7.3*   < > 7.3* 7.7* 7.1*  MG 2.5*  --  2.6*  --  2.7*  PHOS 1.5*   < > 2.1* 1.3* 1.3*   < > = values in this interval not displayed.    CBC Recent Labs  Lab 04/30/19 0436 04/30/19 1116 05/01/19 0449 05/01/19 1731 05/11/2019 0334  WBC 10.8*  --  16.6*  --  16.6*  HGB 7.9*   < > 9.3* 9.2* 7.9*  HCT 24.4*   < > 28.3* 27.0* 24.2*  PLT 211  --  212  --  187   < > = values in this interval not displayed.    ABG Recent Labs  Lab  05/01/19 0445 05/01/19 1731 04/27/2019 0430  PHART 7.432 7.481* 7.426  PCO2ART 42.9 36.2 40.4  PO2ART 104.0 69.0* 172*    Coag's Recent Labs  Lab 04/30/19 0436 05/01/19 0449 04/27/2019 0334  APTT 39* 42* 59*  INR  --  1.6*  --     Sepsis Markers Recent Labs  Lab 04/27/19 0906  PROCALCITON 6.93    Cardiac Enzymes No results for input(s): TROPONINI, PROBNP in the last 168 hours.  PAST MEDICAL HISTORY :   He  has a past medical history of Cholecystitis (05/6385), Complication of anesthesia, Diabetes mellitus without complication ([place]), Erectile dysfunction (2010), HTN (hypertension), NSTEMI (non-ST elevated myocardial infarction) ([place]) (04/12/2019), and PONV (postoperative nausea and vomiting).  PAST SURGICAL HISTORY:  He  has a past surgical history that includes Appendectomy (1969); Finger surgery (Left, 2004); Cholecystectomy; ERCP (N/A, 04/02/2013); Cholecystectomy (N/A, 04/02/2013); RIGHT/LEFT HEART CATH AND CORONARY ANGIOGRAPHY (N/A, 04/13/2019); RIGHT HEART CATH (N/A, 04/21/2019); IABP Insertion (N/A, 05/01/2019); Placement of impella left ventricular assist device (Right, 05/01/2019); TEE without cardioversion (N/A, 04/27/2019); Coronary artery bypass graft (N/A, 04/28/2019); Mitral valve replacement (N/A, 04/27/2019); Removal of impella left ventricular assist device (N/A, 04/19/2019); TEE without cardioversion (N/A, 05/03/2019); and Bronchial washings (N/A, 04/24/2019).  Allergies  Allergen Reactions  . Acetaminophen Itching    No current facility-administered medications on file prior to encounter.   Current Outpatient Medications on File Prior to Encounter  Medication Sig  . Insulin Glargine (BASAGLAR KWIKPEN) 100 UNIT/ML Inject 28 Units into the skin at bedtime.  . insulin lispro (HUMALOG) 100 UNIT/ML injection Inject 8 Units into the skin 2 (two) times daily before a meal.  . metFORMIN (GLUCOPHAGE) 500 MG tablet Take 1 tablet (500 mg total) by mouth 2 (two) times daily with a meal.  .  glucose blood (RELION GLUCOSE TEST STRIPS) test strip Use as instructed  . Insulin Pen Needle (PEN NEEDLES) 32G X 4 MM MISC   . ReliOn Ultra Thin Lancets MISC Use to test blood sugar up to 4 times daily.    FAMILY HISTORY:   His family history includes Colon cancer in his maternal aunt; Diabetes in his maternal grandmother; Stroke in his maternal grandmother.  SOCIAL HISTORY:  He  reports that he has never smoked. His smokeless tobacco use includes snuff and chew. He reports  previous alcohol use. He reports that he does not use drugs.  REVIEW OF SYSTEMS:    All ROS negative except for what is noted in the subjective.

## 2019-05-02 NOTE — Transfer of Care (Signed)
Immediate Anesthesia Transfer of Care Note  Patient: EMMERSON TADDEI  Procedure(s) Performed: VIDEO BRONCHOSCOPY USING DISPOSABLE ANESTHESIA SCOPE (N/A ) TRACHEOSTOMY (N/A )  Patient Location: ICU  Anesthesia Type:General  Level of Consciousness: drowsy, patient cooperative and Patient remains intubated per anesthesia plan  Airway & Oxygen Therapy: Patient remains intubated per anesthesia plan and Patient placed on Ventilator (see vital sign flow sheet for setting)  Post-op Assessment: Report given to RN and Post -op Vital signs reviewed and stable  Post vital signs: Reviewed and stable  Last Vitals:  Vitals Value Taken Time  BP    Temp    Pulse    Resp    SpO2      Last Pain:  Vitals:   04/26/2019 1000  TempSrc: Core  PainSc:       Patients Stated Pain Goal: 3 (03/70/48 8891)  Complications: No apparent anesthesia complications

## 2019-05-02 NOTE — Progress Notes (Addendum)
Patient ID: Brendan [name], male   DOB: Jun 23, 1955, 64 y.o.   MRN: [id number]     Advanced Heart Failure Rounding Note  PCP-Cardiologist: No primary care provider on file.   Subjective:    Events - Presented to [facility] with acute HF  EF 20-25% - Transferred to [facility] - Cath 3/30 with severe 3 vessel disease. EF 25% - Developed PMVT on milrinone -> milrinone stopped -> developed worsening shock -> IABP placed on 4/1 - Clinical deterioration 4/1-> intubated - 4/2 underwent Impella 5.5 placement earlier and bilateral chest tubes with 3L out from each side.  - Extubated 4/4 - 04/28/2019 CABG and MVR - Extubated 4/7 - Back to OR for Impella 5.5 extraction 4/9, extubated.  TEE with EF 35%, RV ok, trivial MR s/p MV repair.  - CVVH started 4/10 with oliguria, rising creatinine and CVP.  - Deteriorating respiratory status, intubated again early am 4/12.  Enterococcus faecalis in sputum.  - Extubated 4/14.  Developed respiratory distress and started on Bipap.  Had VT arrest with CPR/epinephrine, intubated again 4/15 early am.   Remains Intubated and Sedated. FIO2 40%. Going to OR for trach today.  On NE 23, vasopressin 0.04, milrinone 0.125. Co-ox 79%.   No recurrent VT. Continues on amiodarone gtt at 60 mg/hr.   On CVVH. CVP down to 5. Remains anuric.  Hgb down from 9.2>>7.9.   He is now on ampicillin with E faecalis in sputum, fluconazole with candida in urine.   Echo (4/10): EF 25-30%, normal RV, no significant MR.  I do not think there is an apical thrombus (think trabeculation).    Objective:   Weight Range: 67.5 kg Body mass index is 20.75 kg/m.   Vital Signs:   Temp:  [93.6 F (34.2 C)-99.5 F (37.5 C)] 93.6 F (34.2 C) (04/16 0800) Pulse Rate:  [70-111] 88 (04/16 0800) Resp:  [11-28] 14 (04/16 0800) BP: (90-109)/(36-79) 91/59 (04/16 0800) SpO2:  [96 %-100 %] 100 % (04/16 0800) Arterial Line BP: (91-124)/(31-58) 106/44 (04/16 0800) FiO2 (%):  [40 %] 40 % (04/16  0800) Weight:  [67.5 kg] 67.5 kg (04/16 0400) Last BM Date: 05/01/19  Weight change: Filed Weights   04/30/19 0200 05/01/19 0554 04/21/2019 0400  Weight: 67.8 kg 67.8 kg 67.5 kg    Intake/Output:   Intake/Output Summary (Last 24 hours) at 05/16/2019 0846 Last data filed at 05/14/2019 0800 Gross per 24 hour  Intake 3627.73 ml  Output 3699 ml  Net -71.27 ml      Physical Exam   PHYSICAL EXAM: CVP 5 General:  Intubated and sedated.  HEENT: + ETT Neck: supple. no JVD. Carotids 2+ bilat; no bruits. No lymphadenopathy or thyromegaly appreciated. + Lt IJ HD cath  Cor: PMI nondisplaced. Regular rate & rhythm. No rubs, gallops or murmurs. Lungs: intubated and clear Abdomen: soft, nontender, nondistended. No hepatosplenomegaly. No bruits or masses. Good bowel sounds. Extremities: no cyanosis, clubbing, rash, 2+ bilateral LEE up to thighs, extremities cool to touch  Neuro: intubated and sedated  Telemetry   A - V sequentially paced 80s, no recurrent VT  Personally reviewed  Labs    CBC Recent Labs    05/01/19 0449 05/01/19 0449 05/01/19 1731 05/11/2019 0334  WBC 16.6*  --   --  16.6*  HGB 9.3*   < > 9.2* 7.9*  HCT 28.3*   < > 27.0* 24.2*  MCV 87.1  --   --  87.1  PLT 212  --   --  187   < > = values in this interval not displayed.   Basic Metabolic Panel Recent Labs    05/01/19 0449 05/01/19 0449 05/01/19 1547 05/01/19 1547 05/01/19 1731 05/16/2019 0334  NA 136   < > 136   < > 136 135  K 4.8   < > 5.0   < > 4.9 4.0  CL 102   < > 102  --   --  101  CO2 24   < > 23  --   --  23  GLUCOSE 135*   < > 161*  --   --  188*  BUN 29*   < > 28*  --   --  26*  CREATININE 1.58*   < > 1.46*  --   --  1.28*  CALCIUM 7.3*   < > 7.7*  --   --  7.1*  MG 2.6*  --   --   --   --  2.7*  PHOS 2.1*   < > 1.3*  --   --  1.3*   < > = values in this interval not displayed.   Liver Function Tests Recent Labs    04/30/19 0436 04/30/19 1600 05/01/19 0449 05/01/19 0449 05/01/19 1547  04/21/2019 0334  AST 180*  --  301*  --   --   --   ALT 198*  --  231*  --   --   --   ALKPHOS 488*  --  501*  --   --   --   BILITOT 1.0  --  0.8  --   --   --   PROT 5.2*  --  5.1*  --   --   --   ALBUMIN 1.3*  1.4*   < > 1.4*  1.4*   < > 1.4* 1.4*   < > = values in this interval not displayed.   No results for input(s): LIPASE, AMYLASE in the last 72 hours. Cardiac Enzymes No results for input(s): CKTOTAL, CKMB, CKMBINDEX, TROPONINI in the last 72 hours.  BNP: BNP (last 3 results) Recent Labs    04/10/2019 0113  BNP 655.7*    ProBNP (last 3 results) No results for input(s): PROBNP in the last 8760 hours.   D-Dimer No results for input(s): DDIMER in the last 72 hours. Hemoglobin A1C No results for input(s): HGBA1C in the last 72 hours. Fasting Lipid Panel No results for input(s): CHOL, HDL, LDLCALC, TRIG, CHOLHDL, LDLDIRECT in the last 72 hours. Thyroid Function Tests No results for input(s): TSH, T4TOTAL, T3FREE, THYROIDAB in the last 72 hours.  Invalid input(s): FREET3  Other results:   Imaging    No results found.   Medications:     Scheduled Medications: . aspirin EC  325 mg Oral Daily   Or  . aspirin  324 mg Per Tube Daily  . B-complex with vitamin C  1 tablet Per Tube Daily  . bisacodyl  10 mg Oral Daily   Or  . bisacodyl  10 mg Rectal Daily  . chlorhexidine gluconate (MEDLINE KIT)  15 mL Mouth Rinse BID  . Chlorhexidine Gluconate Cloth  6 each Topical Daily  . docusate  200 mg Per Tube Daily  . feeding supplement (PRO-STAT SUGAR FREE 64)  30 mL Per Tube BID  . gabapentin  300 mg Per Tube Q8H  . insulin aspart  0-24 Units Subcutaneous Q4H  . insulin glargine  8 Units Subcutaneous BID  . mouth rinse  15   mL Mouth Rinse 10 times per day  . pantoprazole sodium  40 mg Per Tube Daily  . pneumococcal 23 valent vaccine  0.5 mL Intramuscular Tomorrow-1000  . polyethylene glycol  17 g Oral Daily  . rosuvastatin  10 mg Per Tube q1800  . sodium chloride  flush  10-40 mL Intracatheter Q12H    Infusions: .  prismasol BGK 4/2.5 400 mL/hr at 05/14/2019 0441  . sodium chloride Stopped (04/29/19 0928)  . sodium chloride    . sodium chloride 1 mL/hr at 05/01/19 1900  . sodium chloride 20 mL/hr at 05/01/19 1900  . sodium chloride    . amiodarone 60 mg/hr (04/28/2019 0800)  . ampicillin-sulbactam (UNASYN) IV 200 mL/hr at 05/09/2019 0800  . dexmedetomidine 0.5 mcg/kg/hr (05/09/2019 0800)  . dextrose 75 mL/hr at 05/01/2019 0812  . epinephrine Stopped (04/28/19 0138)  . feeding supplement (VITAL AF 1.2 CAL) Stopped (05/01/2019 0000)  . fentaNYL infusion INTRAVENOUS Stopped (04/30/19 1211)  . heparin 10,000 units/ 20 mL infusion syringe 1,000 Units/hr (05/01/19 1334)  . lactated ringers Stopped (04/21/2019 1640)  . lactated ringers Stopped (04/30/19 0930)  . milrinone 0.125 mcg/kg/min (05/01/2019 0500)  . norepinephrine (LEVOPHED) Adult infusion 23 mcg/min (05/14/2019 0516)  . prismasol BGK 0/2.5 200 mL/hr at 05/01/19 2219  . prismasol BGK 4/2.5 1,800 mL/hr at 04/29/2019 0808  . vasopressin (PITRESSIN) infusion - *FOR SHOCK* 0.04 Units/min (05/04/2019 0500)    PRN Medications: sodium chloride, Place/Maintain arterial line **AND** sodium chloride, alteplase, fentaNYL, fentaNYL (SUBLIMAZE) injection, fentaNYL (SUBLIMAZE) injection, heparin, levalbuterol, metoprolol tartrate, midazolam, ondansetron (ZOFRAN) IV, ondansetron (ZOFRAN) IV, sodium chloride, sodium chloride flush, sorbitol     Assessment/Plan    1.Acute systolic HF ->  Cardiogenic Shock  - Due to iCM. EF 20-25% - Impella 5.5 placed on 4/2.   - s/p CABG/MVRepair on 4/6  - Impella out 4/9.  - Echo 4/10 with EF 25-30%, normal RV, no MR.   - Now on CVVH with volume overload/progressive renal failure. CVP 5 today, will continue CVVH aiming to keep gently negative today. We are close to pre-op weight.  - Co-ox 79% on norepinephrine 23 + vasopressin 0.04 + milrinone 0.125. MAPs low 70s.  Continue wean NE as  able today.  Will add midodrine 5 mg tid per tube to help wean NE and VP.   2. CAD - LHC with severe 3 vessel CAD  - s/p CABG x 3 MV Repair 4/6  - on ASA/Crestor  3. Acute hypoxic respiratory failure - Extubated 4/9.  - CXR with extensive pulmonary edema and ?PNA component, re-intubated on 4/12.   - CVP now down with CVVH.  - Extubation 4/14, failed with respiratory distress.  - Plan tracheostomy today   4. Mitral Regurgitation - Mod-severe on ECHO - s/p MV repair, TEE 4/9 with minimal MR s/p repair.   - Echo 4/10 with no significant MR.   5. Uncontrolled DM - Hgb A1C 9.  - On insulin and sliding scale.   6. AKI - due to shock - Became anuric and now on CVVH to control volume overload.  Renal following.   7. Acute blood loss Anemia  -Drop in hgb from 9.2>>7.9  -Monitor and transfuse if hgb < 7.0   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 - no further VT on tele  - Keep electrolytes stable. K 4.0 and Mg 2.7 today  9. 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 No sensation R and L   foot and occasionaly in his hands. Requires assistance with ADLs.   10. Severe Malnutrition - Prealbumin 8.9  - Nutrition on board - Continue TFs  11. ID - Enterococcus faecalis in sputum => Unasyn - Candida in urine => fluconazole x 7 days.   12. Elevated LFTs - Suspect shock liver - will f/u. Check CMP tomorrow   Length of Stay: 18  Brittainy Simmons, PA-C  05/09/2019, 8:46 AM  Advanced Heart Failure Team Pager [phone] (M-F; 7a - 4p)  Please contact [facility] for night-coverage after hours (4p -7a ) and weekends on [email or web address]  Patient seen with PA, agree with the above note.   He is s/p tracheostomy today.  Remains on vasopressin 0.04, NE 34, milrinone 0.125.  CVVH ongoing with UF net 50 cc/hr.  CVP 5-7.  CXR with bilateral opacities.   General: NAD, sleeping Neck: No JVD, no thyromegaly or thyroid nodule.  Tracheostomy Lungs: Coarse BS CV: Nondisplaced PMI.  Heart regular S1/S2, no S3/S4, no murmur.  No peripheral edema.   Abdomen: Soft, nontender, no hepatosplenomegaly, no distention.  Skin: Intact without lesions or rashes.  Neurologic: Sleeping Extremities: No clubbing or cyanosis.  HEENT: Normal.   Wean pressors as able, will add midodrine 5 mg tid to help.  Will continue milrinone 0.125. Co-ox steady at 79%.   CXR with bilateral opacities, suspect HCAP +/- pulmonary edema.  CVP is now down to 5-7.   - Continue CVVH, pull gently negative 50 cc/hr net.  - Continue Unasyn for Enterococcus in sputum.   No arrhythmias, can decrease amiodarone gtt to 30 mg/hr.   CRITICAL CARE Performed by:    Total critical care time: 35 minutes  Critical care time was exclusive of separately billable procedures and treating other patients.  Critical care was necessary to treat or prevent imminent or life-threatening deterioration.  Critical care was time spent personally by me on the following activities: development of treatment plan with patient and/or surrogate as well as nursing, discussions with consultants, evaluation of patient's response to treatment, examination of patient, obtaining history from patient or surrogate, ordering and performing treatments and interventions, ordering and review of laboratory studies, ordering and review of radiographic studies, pulse oximetry and re-evaluation of patient's condition.    04/24/2019 2:15 PM   

## 2019-05-02 NOTE — Brief Op Note (Addendum)
05/10/2019  11:30 AM  PATIENT:  Sunday Corn  64 y.o. male  PRE-OPERATIVE DIAGNOSIS:  re intubated for respiratory failure  POST-OPERATIVE DIAGNOSIS:  Respiratory failure PROCEDURE:  Bronchoscopy, Tracheostomy No. 6 [place]   SURGEON:  Surgeon(s) and Role:    Ivin Poot, MD - Primary  PHYSICIAN ASSISTANT:   ASSISTANTS: Maryan Char  ANESTHESIA:   general  EBL:  10 mL   BLOOD ADMINISTERED:none  DRAINS: none   LOCAL MEDICATIONS USED:  NONE  SPECIMEN:  Aspirate  DISPOSITION OF SPECIMEN:  bronchial washings to microbiology  COUNTS:  YES  TOURNIQUET:  * No tourniquets in log *  DICTATION: .Dragon Dictation  PLAN OF CARE: return to 2 H  PATIENT DISPOSITION:  ICU - intubated and hemodynamically stable.   Delay start of Pharmacological VTE agent (>24hrs) due to surgical blood loss or risk of bleeding: yes

## 2019-05-02 NOTE — Progress Notes (Signed)
Inpatient Diabetes Program Recommendations  AACE/ADA: New Consensus Statement on Inpatient Glycemic Control (2015)  Target Ranges:  Prepandial:   less than 140 mg/dL      Peak postprandial:   less than 180 mg/dL (1-2 hours)      Critically ill patients:  140 - 180 mg/dL   Lab Results  Component Value Date   GLUCAP 101 (H) 05/01/2019   HGBA1C 9.0 (H) 03/22/2019    Review of Glycemic Control  Hypo this am of 22 m/dL @0740 . Likely from receiving 12 units Novolog @ 0350 for blood sugar of 188 mg/dL. TFs were Held.  HgbA1C - 9.0% - needs tighter control  Inpatient Diabetes Program Recommendations:     Consider decreasing Novolog correction to 0-9 units Q4H. Will need TF coverage (Novolog 3-4 units Q4H) when TF resumed.  Continue to follow.  Thank you. Lorenda Peck, RD, LDN, CDE Inpatient Diabetes Coordinator [phone]

## 2019-05-02 NOTE — Progress Notes (Signed)
Pre Procedure note for inpatients:   Brendan [name] has been scheduled for bronchoscopy, tracheostomy, placement of central line   The patient has been seen and labs reviewed. There are no changes in the patient's condition to prevent proceeding with the planned procedure today.  Recent labs:  Lab Results  Component Value Date   WBC 16.6 (H) 05/12/2019   HGB 7.9 (L) 04/21/2019   HCT 24.2 (L) 05/13/2019   PLT 187 05/14/2019   GLUCOSE 188 (H) 05/08/2019   CHOL 174 08/21/2017   TRIG 143 08/21/2017   HDL 57 08/21/2017   LDLCALC 88 08/21/2017   ALT 231 (H) 05/01/2019   AST 301 (H) 05/01/2019   NA 135 04/29/2019   K 4.0 04/29/2019   CL 101 04/27/2019   CREATININE 1.28 (H) 04/26/2019   BUN 26 (H) 05/06/2019   CO2 23 05/12/2019   TSH 0.925 04/10/2019   INR 1.6 (H) 05/01/2019   HGBA1C 9.0 (H) 04/11/2019   MICROALBUR 30 08/21/2017    Len Childs, MD 04/26/2019 10:06 AM

## 2019-05-02 NOTE — Progress Notes (Signed)
Patient taken off vent to transport to OR by CRNA using an Ambu bag.

## 2019-05-03 ENCOUNTER — Inpatient Hospital Stay (HOSPITAL_COMMUNITY): Payer: Medicaid Other

## 2019-05-03 DIAGNOSIS — Z930 Tracheostomy status: Secondary | ICD-10-CM

## 2019-05-03 LAB — COMPREHENSIVE METABOLIC PANEL
ALT: 222 U/L — ABNORMAL HIGH (ref 0–44)
AST: 361 U/L — ABNORMAL HIGH (ref 15–41)
Albumin: 1.7 g/dL — ABNORMAL LOW (ref 3.5–5.0)
Alkaline Phosphatase: 558 U/L — ABNORMAL HIGH (ref 38–126)
Anion gap: 10 (ref 5–15)
BUN: 29 mg/dL — ABNORMAL HIGH (ref 8–23)
CO2: 23 mmol/L (ref 22–32)
Calcium: 7.1 mg/dL — ABNORMAL LOW (ref 8.9–10.3)
Chloride: 102 mmol/L (ref 98–111)
Creatinine, Ser: 1.54 mg/dL — ABNORMAL HIGH (ref 0.61–1.24)
GFR calc Af Amer: 56 mL/min — ABNORMAL LOW (ref >60)
GFR calc non Af Amer: 48 mL/min — ABNORMAL LOW (ref >60)
Glucose, Bld: 147 mg/dL — ABNORMAL HIGH (ref 70–99)
Potassium: 4.4 mmol/L (ref 3.5–5.1)
Sodium: 135 mmol/L (ref 135–145)
Total Bilirubin: 0.8 mg/dL (ref 0.3–1.2)
Total Protein: 5.3 g/dL — ABNORMAL LOW (ref 6.5–8.1)

## 2019-05-03 LAB — CBC
HCT: 24 % — ABNORMAL LOW (ref 39.0–52.0)
Hemoglobin: 7.7 g/dL — ABNORMAL LOW (ref 13.0–17.0)
MCH: 27.7 pg (ref 26.0–34.0)
MCHC: 32.1 g/dL (ref 30.0–36.0)
MCV: 86.3 fL (ref 80.0–100.0)
Platelets: 258 10*3/uL (ref 150–400)
RBC: 2.78 MIL/uL — ABNORMAL LOW (ref 4.22–5.81)
RDW: 17.5 % — ABNORMAL HIGH (ref 11.5–15.5)
WBC: 20 10*3/uL — ABNORMAL HIGH (ref 4.0–10.5)
nRBC: 0.1 % (ref 0.0–0.2)

## 2019-05-03 LAB — CORTISOL-AM, BLOOD: Cortisol - AM: 31.5 ug/dL — ABNORMAL HIGH (ref 6.7–22.6)

## 2019-05-03 LAB — RENAL FUNCTION PANEL
Albumin: 2 g/dL — ABNORMAL LOW (ref 3.5–5.0)
Anion gap: 10 (ref 5–15)
BUN: 26 mg/dL — ABNORMAL HIGH (ref 8–23)
CO2: 23 mmol/L (ref 22–32)
Calcium: 6.9 mg/dL — ABNORMAL LOW (ref 8.9–10.3)
Chloride: 101 mmol/L (ref 98–111)
Creatinine, Ser: 1.27 mg/dL — ABNORMAL HIGH (ref 0.61–1.24)
GFR calc Af Amer: >60 mL/min (ref >60)
GFR calc non Af Amer: >60 mL/min (ref >60)
Glucose, Bld: 241 mg/dL — ABNORMAL HIGH (ref 70–99)
Phosphorus: 2.9 mg/dL (ref 2.5–4.6)
Potassium: 4.6 mmol/L (ref 3.5–5.1)
Sodium: 134 mmol/L — ABNORMAL LOW (ref 135–145)

## 2019-05-03 LAB — MAGNESIUM: Magnesium: 2.4 mg/dL (ref 1.7–2.4)

## 2019-05-03 LAB — GLUCOSE, CAPILLARY
Glucose-Capillary: 125 mg/dL — ABNORMAL HIGH (ref 70–99)
Glucose-Capillary: 131 mg/dL — ABNORMAL HIGH (ref 70–99)
Glucose-Capillary: 135 mg/dL — ABNORMAL HIGH (ref 70–99)
Glucose-Capillary: 158 mg/dL — ABNORMAL HIGH (ref 70–99)
Glucose-Capillary: 178 mg/dL — ABNORMAL HIGH (ref 70–99)
Glucose-Capillary: 201 mg/dL — ABNORMAL HIGH (ref 70–99)
Glucose-Capillary: 201 mg/dL — ABNORMAL HIGH (ref 70–99)

## 2019-05-03 LAB — BLOOD GAS, ARTERIAL
Acid-Base Excess: 0.7 mmol/L (ref 0.0–2.0)
Bicarbonate: 25 mmol/L (ref 20.0–28.0)
Drawn by: 164
FIO2: 40
O2 Saturation: 98.7 %
Patient temperature: 36.2
pCO2 arterial: 39.9 mmHg (ref 32.0–48.0)
pH, Arterial: 7.409 (ref 7.350–7.450)
pO2, Arterial: 135 mmHg — ABNORMAL HIGH (ref 83.0–108.0)

## 2019-05-03 LAB — HEMOGLOBIN AND HEMATOCRIT, BLOOD
HCT: 24.9 % — ABNORMAL LOW (ref 39.0–52.0)
Hemoglobin: 8.1 g/dL — ABNORMAL LOW (ref 13.0–17.0)

## 2019-05-03 LAB — PREPARE RBC (CROSSMATCH)

## 2019-05-03 LAB — ACID FAST SMEAR (AFB, MYCOBACTERIA): Acid Fast Smear: NEGATIVE

## 2019-05-03 LAB — COOXEMETRY PANEL
Carboxyhemoglobin: 1.3 % (ref 0.5–1.5)
Methemoglobin: 1.3 % (ref 0.0–1.5)
O2 Saturation: 76 %
Total hemoglobin: 7.7 g/dL — ABNORMAL LOW (ref 12.0–16.0)

## 2019-05-03 LAB — PHOSPHORUS: Phosphorus: 2.2 mg/dL — ABNORMAL LOW (ref 2.5–4.6)

## 2019-05-03 MED ORDER — AMIODARONE HCL IN DEXTROSE 360-4.14 MG/200ML-% IV SOLN
30.0000 mg/h | INTRAVENOUS | Status: DC
  Administered 2019-05-03 – 2019-05-04 (×2): 30 mg/h via INTRAVENOUS
  Filled 2019-05-03 (×2): qty 200

## 2019-05-03 MED ORDER — SODIUM PHOSPHATES 45 MMOLE/15ML IV SOLN
30.0000 mmol | Freq: Once | INTRAVENOUS | Status: AC
  Administered 2019-05-03: 30 mmol via INTRAVENOUS
  Filled 2019-05-03: qty 10

## 2019-05-03 MED ORDER — SODIUM CHLORIDE 0.9% IV SOLUTION: Freq: Once | INTRAVENOUS | Status: AC

## 2019-05-03 MED ORDER — MIDODRINE HCL 5 MG PO TABS
10.0000 mg | ORAL_TABLET | Freq: Three times a day (TID) | ORAL | Status: DC
  Administered 2019-05-03 – 2019-05-05 (×7): 10 mg
  Filled 2019-05-03 (×7): qty 2

## 2019-05-03 MED ORDER — INSULIN GLARGINE 100 UNIT/ML ~~LOC~~ SOLN
10.0000 [IU] | Freq: Two times a day (BID) | SUBCUTANEOUS | Status: DC
  Administered 2019-05-03 – 2019-05-14 (×22): 10 [IU] via SUBCUTANEOUS
  Filled 2019-05-03 (×23): qty 0.1

## 2019-05-03 MED ORDER — ALBUMIN HUMAN 25 % IV SOLN
12.5000 g | Freq: Four times a day (QID) | INTRAVENOUS | Status: AC
  Administered 2019-05-03 (×3): 12.5 g via INTRAVENOUS
  Filled 2019-05-03 (×3): qty 50

## 2019-05-03 NOTE — Progress Notes (Signed)
Patient ID: Brendan [name], male   DOB: 06-Jan-1956, 64 y.o.   MRN: [id number]     Advanced Heart Failure Rounding Note  PCP-Cardiologist: No primary care provider on file.   Subjective:    Events - Presented to [facility] with acute HF  EF 20-25% - Transferred to [facility] - Cath 3/30 with severe 3 vessel disease. EF 25% - Developed PMVT on milrinone -> milrinone stopped -> developed worsening shock -> IABP placed on 4/1 - Clinical deterioration 4/1-> intubated - 4/2 underwent Impella 5.5 placement earlier and bilateral chest tubes with 3L out from each side.  - Extubated 4/4 - 04/27/2019 CABG and MVR - Extubated 4/7 - Back to OR for Impella 5.5 extraction 4/9, extubated.  TEE with EF 35%, RV ok, trivial MR s/p MV repair.  - CVVH started 4/10 with oliguria, rising creatinine and CVP.  - Deteriorating respiratory status, intubated again early am 4/12.  Enterococcus faecalis in sputum.  - Extubated 4/14.  Developed respiratory distress and started on Bipap.  Had VT arrest with CPR/epinephrine, intubated again 4/15 early am.  - Tracheostomy 4/16 - Atrial fibrillation early am 4/17  Vent via trach. Follow commands.   On NE 38, vasopressin 0.02, milrinone 0.125. Co-ox 76%. MAP marginal this morning.  CVP 6, CVVH ongoing aiming for net UF 50 cc/hr.   No recurrent VT but had atrial fibrillation/RVR overnight.  Amiodarone gtt increased to 60 and he is now back in NSR with PACs.   Hgb down from 9.2>>7.9>>7.7.   He is now on Unasyn with E faecalis in sputum, fluconazole with candida in urine. Afebrile, WBCs stable at 20K.   Echo (4/10): EF 25-30%, normal RV, no significant MR.  I do not think there is an apical thrombus (think trabeculation).    Objective:   Weight Range: 68.5 kg Body mass index is 21.06 kg/m.   Vital Signs:   Temp:  [93 F (33.9 C)-97.9 F (36.6 C)] 97.5 F (36.4 C) (04/17 0700) Pulse Rate:  [40-89] 80 (04/17 0804) Resp:  [9-31] 18 (04/17 0804) BP: (87-112)/(39-58)  98/45 (04/17 0700) SpO2:  [95 %-100 %] 98 % (04/17 0804) Arterial Line BP: (93-140)/(33-47) 101/34 (04/17 0700) FiO2 (%):  [40 %-50 %] 40 % (04/17 0804) Weight:  [68.5 kg] 68.5 kg (04/17 0300) Last BM Date: 04/26/2019  Weight change: Filed Weights   05/01/19 0554 05/14/2019 0400 05/03/19 0300  Weight: 67.8 kg 67.5 kg 68.5 kg    Intake/Output:   Intake/Output Summary (Last 24 hours) at 05/03/2019 0808 Last data filed at 05/03/2019 0700 Gross per 24 hour  Intake 3907.26 ml  Output 3962 ml  Net -54.74 ml      Physical Exam   General: Awake Neck: No JVD, tracheostomy in place, no thyromegaly or thyroid nodule.  Lungs: Decreased at bases.  CV: Nondisplaced PMI.  Heart regular S1/S2, no S3/S4, no murmur.  No peripheral edema.   Abdomen: Soft, nontender, no hepatosplenomegaly, no distention.  Skin: Intact without lesions or rashes.  Neurologic: Alert, follows commands Extremities: No clubbing or cyanosis.  HEENT: Normal.    Telemetry   NSR with PACs.  Atrial fibrillation last night. Personally reviewed  Labs    CBC Recent Labs    05/14/2019 1526 05/04/2019 1526 04/23/2019 1535 05/03/19 0317  WBC 21.1*  --   --  20.0*  HGB 8.4*   < > 9.5* 7.7*  HCT 25.3*   < > 28.0* 24.0*  MCV 86.6  --   --  86.3  PLT 227  --   --  258   < > = values in this interval not displayed.   Basic Metabolic Panel Recent Labs    05/15/2019 0334 04/21/2019 0334 05/14/2019 1526 05/14/2019 1526 05/04/2019 1535 05/03/19 0317  NA 135   < > 133*   < > 134* 135  K 4.0   < > 4.9   < > 4.6 4.4  CL 101   < > 101  --   --  102  CO2 23   < > 23  --   --  23  GLUCOSE 188*   < > 240*  --   --  147*  BUN 26*   < > 29*  --   --  29*  CREATININE 1.28*   < > 1.49*  --   --  1.54*  CALCIUM 7.1*   < > 7.0*  --   --  7.1*  MG 2.7*  --   --   --   --  2.4  PHOS 1.3*   < > 4.4  --   --  2.2*   < > = values in this interval not displayed.   Liver Function Tests Recent Labs    05/01/19 0449 05/01/19 1547  05/10/2019 1526 05/03/19 0317  AST 301*  --   --  361*  ALT 231*  --   --  222*  ALKPHOS 501*  --   --  558*  BILITOT 0.8  --   --  0.8  PROT 5.1*  --   --  5.3*  ALBUMIN 1.4*  1.4*   < > 1.4* 1.7*   < > = values in this interval not displayed.   No results for input(s): LIPASE, AMYLASE in the last 72 hours. Cardiac Enzymes No results for input(s): CKTOTAL, CKMB, CKMBINDEX, TROPONINI in the last 72 hours.  BNP: BNP (last 3 results) Recent Labs    04/09/2019 0113  BNP 655.7*    ProBNP (last 3 results) No results for input(s): PROBNP in the last 8760 hours.   D-Dimer No results for input(s): DDIMER in the last 72 hours. Hemoglobin A1C No results for input(s): HGBA1C in the last 72 hours. Fasting Lipid Panel No results for input(s): CHOL, HDL, LDLCALC, TRIG, CHOLHDL, LDLDIRECT in the last 72 hours. Thyroid Function Tests No results for input(s): TSH, T4TOTAL, T3FREE, THYROIDAB in the last 72 hours.  Invalid input(s): FREET3  Other results:   Imaging    DG Chest Portable 1 View  Result Date: 04/29/2019 CLINICAL DATA:  Status post bronchoscopy. EXAM: PORTABLE CHEST 1 VIEW COMPARISON:  May 02, 2019. FINDINGS: The heart size and mediastinal contours are within normal limits. Tracheostomy tube is in good position. Feeding tube is seen entering stomach. Left subclavian dialysis catheter is unchanged in position. No pneumothorax is noted. No significant pleural effusion is noted. Bilateral lung opacities are noted, right greater than left, concerning for worsening pneumonia or edema. The visualized skeletal structures are unremarkable. IMPRESSION: Stable support apparatus. Bilateral lung opacities are noted, right greater than left, concerning for worsening pneumonia or edema. No pneumothorax is noted. Electronically Signed   By: Marijo Conception M.D.   On: 05/15/2019 13:45   US Abdomen Limited RUQ  Result Date: 04/24/2019 CLINICAL DATA:  Cholestasis.  Cholecystectomy. EXAM:  ULTRASOUND ABDOMEN LIMITED RIGHT UPPER QUADRANT COMPARISON:  Renal ultrasound 04/27/2019 FINDINGS: Gallbladder: Surgically absent Common bile duct: Diameter: Normal, 7 mm. Liver: Mildly obscured by abdominal bandages and patient immobility.  Grossly within normal limits. Portal vein is patent on color Doppler imaging with normal direction of blood flow towards the liver. Other: Trace right pleural fluid. IMPRESSION: Cholecystectomy without biliary duct dilatation. Trace right pleural fluid. Electronically Signed   By: Abigail Miyamoto M.D.   On: 05/10/2019 09:37     Medications:     Scheduled Medications: . aspirin EC  325 mg Oral Daily   Or  . aspirin  324 mg Per Tube Daily  . B-complex with vitamin C  1 tablet Per Tube Daily  . bisacodyl  10 mg Oral Daily   Or  . bisacodyl  10 mg Rectal Daily  . chlorhexidine gluconate (MEDLINE KIT)  15 mL Mouth Rinse BID  . Chlorhexidine Gluconate Cloth  6 each Topical Daily  . docusate  200 mg Per Tube Daily  . enoxaparin (LOVENOX) injection  30 mg Subcutaneous Q24H  . feeding supplement (PRO-STAT SUGAR FREE 64)  30 mL Per Tube BID  . gabapentin  300 mg Per Tube Q8H  . insulin aspart  0-24 Units Subcutaneous Q4H  . insulin aspart  4 Units Subcutaneous Q4H  . insulin glargine  8 Units Subcutaneous BID  . mouth rinse  15 mL Mouth Rinse 10 times per day  . midodrine  5 mg Per Tube TID WC  . pantoprazole sodium  40 mg Per Tube Daily  . pneumococcal 23 valent vaccine  0.5 mL Intramuscular Tomorrow-1000  . polyethylene glycol  17 g Oral Daily  . rosuvastatin  10 mg Per Tube q1800  . sodium chloride flush  10-40 mL Intracatheter Q12H    Infusions: .  prismasol BGK 4/2.5 400 mL/hr at 04/21/2019 2006  .  prismasol BGK 4/2.5 200 mL/hr at 05/06/2019 0950  . sodium chloride    . albumin human Stopped (05/03/19 0659)  . amiodarone 60 mg/hr (05/03/19 0700)  . ampicillin-sulbactam (UNASYN) IV 3 g (05/03/19 0748)  . dexmedetomidine 0.5 mcg/kg/hr (05/03/19 0700)   . epinephrine Stopped (04/28/19 0138)  . feeding supplement (VITAL 1.5 CAL) 1,000 mL (04/21/2019 1405)  . feeding supplement (VITAL AF 1.2 CAL) 1,000 mL (05/14/2019 1354)  . fentaNYL infusion INTRAVENOUS Stopped (04/30/19 1211)  . heparin 10,000 units/ 20 mL infusion syringe 1,000 Units/hr (05/03/19 0623)  . lactated ringers Stopped (04/27/2019 1640)  . lactated ringers Stopped (04/30/19 0930)  . milrinone 0.125 mcg/kg/min (05/03/19 0700)  . norepinephrine (LEVOPHED) Adult infusion 38 mcg/min (05/03/19 0700)  . prismasol BGK 4/2.5 1,800 mL/hr at 05/03/19 7517  . sodium phosphate  Dextrose 5% IVPB    . vasopressin (PITRESSIN) infusion - *FOR SHOCK* 0.02 Units/min (05/03/19 0700)    PRN Medications: Place/Maintain arterial line **AND** sodium chloride, alteplase, fentaNYL, fentaNYL (SUBLIMAZE) injection, fentaNYL (SUBLIMAZE) injection, heparin, levalbuterol, metoprolol tartrate, ondansetron (ZOFRAN) IV, ondansetron (ZOFRAN) IV, sodium chloride, sodium chloride flush     Assessment/Plan    1.Acute systolic HF ->  Cardiogenic Shock  - Due to iCM. EF 20-25% - Impella 5.5 placed on 4/2.   - s/p CABG/MVRepair on 4/6  - Impella out 4/9.  - Echo 4/10 with EF 25-30%, normal RV, no MR.   - Now on CVVH with volume overload/progressive renal failure. CVP 6 today, will continue CVVH aiming to keep gently negative to even today.   - Co-ox 76% on norepinephrine 38 + vasopressin 0.02 + milrinone 0.125. MAP marginal today.  Will decrease amiodarone back to 30 mg/hr and increase midodrine to 10 mg tid.  Can increase vasopressin to 0.03 if needed.  2. CAD - LHC with severe 3 vessel CAD  - s/p CABG x 3 MV Repair 4/6  - on ASA/Crestor  3. Acute hypoxic respiratory failure - Extubated 4/9.  - CXR with extensive pulmonary edema and suspected PNA, re-intubated on 4/12.   - CVP now down with CVVH.  - Extubation 4/14, failed with respiratory distress.  - Now s/p tracheostomy.  - CXR with bilateral  opacities R>L suspect PNA.   4. Mitral Regurgitation - Mod-severe on ECHO - s/p MV repair, TEE 4/9 with minimal MR s/p repair.   - Echo 4/10 with no significant MR.   5. Uncontrolled DM - Hgb A1C 9.  - On insulin and sliding scale.   6. AKI - due to shock - Became anuric and now on CVVH to control volume overload.  Renal following.   7. Acute blood loss Anemia  - Drop in hgb from 9.2>>7.7  - With soft BP and low CVP, will transfuse 1 unit PRBCs this morning.   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 - no further VT on tele  - Keep electrolytes stable.   9. 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 No sensation R and L foot and occasionaly in his hands. Requires assistance with ADLs.   10. Severe Malnutrition - Prealbumin 8.9  - Nutrition on board - Continue TFs  11. ID - Enterococcus faecalis in sputum => Unasyn - Candida in urine => completed fluconazole course.   12. Elevated LFTs - Suspect shock liver - AST/ALT remain stably elevated.  13. Atrial fibrillation - Now back in NSR on amiodarone gtt.  Can decrease amiodarone to 30 mg/hr this morning.   CRITICAL CARE Performed by: Loralie Champagne  Total critical care time: 35 minutes  Critical care time was exclusive of separately billable procedures and treating other patients.  Critical care was necessary to treat or prevent imminent or life-threatening deterioration.  Critical care was time spent personally by me on the following activities: development of treatment plan with patient and/or surrogate as well as nursing, discussions with consultants, evaluation of patient's response to treatment, examination of patient, obtaining history from patient or surrogate, ordering and performing treatments and interventions, ordering and review of laboratory studies, ordering and review of radiographic studies, pulse oximetry and re-evaluation of patient's  condition.  Loralie Champagne 05/03/2019 8:08 AM

## 2019-05-03 NOTE — Progress Notes (Signed)
1 Day Post-Op Procedure(s) (LRB): VIDEO BRONCHOSCOPY USING DISPOSABLE ANESTHESIA SCOPE (N/A) TRACHEOSTOMY (N/A) Subjective: Patient breathing comfortably on trach collar Sinus rhythm for 24 hours-amiodarone reduced to 30 mg/h CVVH removing 50 cc/h Chest x-ray with mild edema Plan to slowly wean norepinephrine as tolerated Continue Unasyn for Enterococcus in sputum  Objective: Vital signs in last 24 hours: Temp:  [93 F (33.9 C)-97.9 F (36.6 C)] 97.7 F (36.5 C) (04/17 0845) Pulse Rate:  [40-89] 41 (04/17 0845) Cardiac Rhythm: Atrial fibrillation;Normal sinus rhythm (04/17 0400) Resp:  [9-31] 18 (04/17 0845) BP: (87-112)/(39-58) 98/45 (04/17 0700) SpO2:  [95 %-100 %] 98 % (04/17 0845) Arterial Line BP: (93-140)/(32-46) 124/32 (04/17 0845) FiO2 (%):  [40 %-50 %] 40 % (04/17 0804) Weight:  [68.5 kg] 68.5 kg (04/17 0300)  Hemodynamic parameters for last 24 hours: CVP:  [5 mmHg-6 mmHg] 6 mmHg  Intake/Output from previous day: 04/16 0701 - 04/17 0700 In: 4030.1 [I.V.:2494.4; NG/GT:900; IV Piggyback:635.6] Out: 4067 [Blood:10] Intake/Output this shift: No intake/output data recorded.  Exam Alert and responsive Sinus rhythm without murmur Minimal peripheral edema Scattered rhonchi Sternal incision healing  Lab Results: Recent Labs    04/21/2019 1526 05/15/2019 1526 05/16/2019 1535 05/03/19 0317  WBC 21.1*  --   --  20.0*  HGB 8.4*   < > 9.5* 7.7*  HCT 25.3*   < > 28.0* 24.0*  PLT 227  --   --  258   < > = values in this interval not displayed.   BMET:  Recent Labs    04/28/2019 1526 04/30/2019 1526 05/05/2019 1535 05/03/19 0317  NA 133*   < > 134* 135  K 4.9   < > 4.6 4.4  CL 101  --   --  102  CO2 23  --   --  23  GLUCOSE 240*  --   --  147*  BUN 29*  --   --  29*  CREATININE 1.49*  --   --  1.54*  CALCIUM 7.0*  --   --  7.1*   < > = values in this interval not displayed.    PT/INR:  Recent Labs    05/01/19 0449  LABPROT 18.7*  INR 1.6*   ABG     Component Value Date/Time   PHART 7.409 05/03/2019 0324   HCO3 25.0 05/03/2019 0324   TCO2 26 05/06/2019 1535   ACIDBASEDEF 3.0 (H) 04/28/2019 0735   O2SAT 98.7 05/03/2019 0324   CBG (last 3)  Recent Labs    05/03/19 0031 05/03/19 0317 05/03/19 0742  GLUCAP 201* 135* 125*    Assessment/Plan: S/P Procedure(s) (LRB): VIDEO BRONCHOSCOPY USING DISPOSABLE ANESTHESIA SCOPE (N/A) TRACHEOSTOMY (N/A) Continue fluid removal nutrition antibiotics and pulmonary toilet Not a candidate for Coumadin currently  LOS: 19 days    Brendan [name] 05/03/2019

## 2019-05-03 NOTE — Progress Notes (Signed)
CT surgery p.m. Rounds  Patient tolerated trach collar all day-we will place back on PRVC rest mode this evening Removing 50 cc/h with CVVH Sinus rhythm with amiodarone reduced to 30 mg P.m. potassium 4.6, glucose 240

## 2019-05-03 NOTE — Procedures (Signed)
Admit: 04/13/2019 LOS: 81  14M dialysis dependent AKI; cardiogenic shock status post CABG + MV replacement 4/2  Current CRRT Prescription: Start Date: 04/26/19 Catheter: L [place] Temp HD Cath BFR: 200 Pre Blood Pump: 400 4K DFR: 1800 4K Replacement Rate: 200 4K Goal UF: 64m/h neg Anticoagulation: 10073mh hep fixed Clotting: infrequent   S:  Trach 4/16  Tolerating UF  Remains open milrinone, norepinephrine, vasopressin  K4.45 this morning, phosphorus 2.2  O: 04/16 0701 - 04/17 0700 In: 3852 [I.V.:2405.5; NG/GT:850; IV Piggyback:596.5] Out: 3837 [Blood:10]  Filed Weights   05/01/19 0554 04/24/2019 0400 05/03/19 0300  Weight: 67.8 kg 67.5 kg 68.5 kg    Recent Labs  Lab 05/12/2019 0334 05/01/2019 0334 05/10/2019 1526 05/04/2019 1535 05/03/19 0317  NA 135   < > 133* 134* 135  K 4.0   < > 4.9 4.6 4.4  CL 101  --  101  --  102  CO2 23  --  23  --  23  GLUCOSE 188*  --  240*  --  147*  BUN 26*  --  29*  --  29*  CREATININE 1.28*  --  1.49*  --  1.54*  CALCIUM 7.1*  --  7.0*  --  7.1*  PHOS 1.3*  --  4.4  --  2.2*   < > = values in this interval not displayed.   Recent Labs  Lab 05/12/2019 0334 04/27/2019 0334 05/10/2019 1526 05/07/2019 1535 05/03/19 0317  WBC 16.6*  --  21.1*  --  20.0*  HGB 7.9*   < > 8.4* 9.5* 7.7*  HCT 24.2*   < > 25.3* 28.0* 24.0*  MCV 87.1  --  86.6  --  86.3  PLT 187  --  227  --  258   < > = values in this interval not displayed.    Scheduled Meds: . aspirin EC  325 mg Oral Daily   Or  . aspirin  324 mg Per Tube Daily  . B-complex with vitamin C  1 tablet Per Tube Daily  . bisacodyl  10 mg Oral Daily   Or  . bisacodyl  10 mg Rectal Daily  . chlorhexidine gluconate (MEDLINE KIT)  15 mL Mouth Rinse BID  . Chlorhexidine Gluconate Cloth  6 each Topical Daily  . docusate  200 mg Per Tube Daily  . enoxaparin (LOVENOX) injection  30 mg Subcutaneous Q24H  . feeding supplement (PRO-STAT SUGAR FREE 64)  30 mL Per Tube BID  . gabapentin  300 mg Per  Tube Q8H  . insulin aspart  0-24 Units Subcutaneous Q4H  . insulin aspart  4 Units Subcutaneous Q4H  . insulin glargine  8 Units Subcutaneous BID  . mouth rinse  15 mL Mouth Rinse 10 times per day  . midodrine  5 mg Per Tube TID WC  . pantoprazole sodium  40 mg Per Tube Daily  . pneumococcal 23 valent vaccine  0.5 mL Intramuscular Tomorrow-1000  . polyethylene glycol  17 g Oral Daily  . rosuvastatin  10 mg Per Tube q1800  . sodium chloride flush  10-40 mL Intracatheter Q12H   Continuous Infusions: .  prismasol BGK 4/2.5 400 mL/hr at 05/10/2019 2006  .  prismasol BGK 4/2.5 200 mL/hr at 04/20/2019 0950  . sodium chloride    . albumin human 12.5 g (05/03/19 0620)  . amiodarone 60 mg/hr (05/03/19 066387 . ampicillin-sulbactam (UNASYN) IV Stopped (05/03/19 0122)  . dexmedetomidine 0.5 mcg/kg/hr (05/03/19 0600)  . epinephrine  Stopped (04/28/19 0138)  . feeding supplement (VITAL 1.5 CAL) 1,000 mL (05/06/2019 1405)  . feeding supplement (VITAL AF 1.2 CAL) 1,000 mL (05/03/2019 1354)  . fentaNYL infusion INTRAVENOUS Stopped (04/30/19 1211)  . heparin 10,000 units/ 20 mL infusion syringe 1,000 Units/hr (05/03/19 0623)  . lactated ringers Stopped (05/14/2019 1640)  . lactated ringers Stopped (04/30/19 0930)  . milrinone 0.125 mcg/kg/min (05/03/19 0600)  . norepinephrine (LEVOPHED) Adult infusion 40 mcg/min (05/03/19 0600)  . prismasol BGK 4/2.5 1,800 mL/hr at 05/03/19 0633  . vasopressin (PITRESSIN) infusion - *FOR SHOCK* 0.02 Units/min (05/03/19 0600)   PRN Meds:.Place/Maintain arterial line **AND** sodium chloride, alteplase, fentaNYL, fentaNYL (SUBLIMAZE) injection, fentaNYL (SUBLIMAZE) injection, heparin, levalbuterol, metoprolol tartrate, midazolam, ondansetron (ZOFRAN) IV, ondansetron (ZOFRAN) IV, sodium chloride, sodium chloride flush  ABG    Component Value Date/Time   PHART 7.409 05/03/2019 0324   PCO2ART 39.9 05/03/2019 0324   PO2ART 135 (H) 05/03/2019 0324   HCO3 25.0 05/03/2019 0324    TCO2 26 04/23/2019 1535   ACIDBASEDEF 3.0 (H) 04/28/2019 0735   O2SAT 98.7 05/03/2019 0324    A/P  1. Dialysis dependent AKI, anuric 2. Hypophosphatemia, from CRRT, repleted this AM CTM 3. Enterococcus faecalis pneumonia; ampicillin 4. VDRF for trach 4/16 5. Cardiogenic shock 6. CAD status post CABG 7. Mitral regurgitation status post mitral valve repair 8. Uncontrolled diabetes mellitus 9. Acute blood loss anemia 10. Recurrent polymorphic ventricular tachycardia, on amiodarone  Cont all 4K baths.  Cont hep in CRRT circuilt. Replete Phos this AM.  Follow CRRT labs.    , MD [facility]  

## 2019-05-03 NOTE — Progress Notes (Addendum)
NAME:  Brendan [name], MRN:  [id number], DOB:  09-20-1955, LOS: 73 ADMISSION DATE:  03/21/2019, CONSULTATION DATE:  04/17/19 REFERRING MD:  Dr. Haroldine Laws, CHIEF COMPLAINT:  Respiratory distress   Brief History   12 yoM originally presented with SOB and fatigue at [facility] found to have new HFrEF, NSTEMI, and bilateral pleural effusions transferred to [facility] on 3/29 for further cardiac evaluation. 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 Started on lasix and milrinone gtts however developed NSVT. Was taken 4/1 for placement of IABP and swan for optimization prior to CABG. Was on precedex for agitation/ confusion, some concern for DTs. On the evening of 4/1, patient developed worsening respiratory distress and hypoxia, PCCM consulted for intubation and vent management.   He was successfully extubated on 4/4 and PCCM signed off. Impella was removed 4/11 and patient noted to have increased WOB throughout the day he was placed on Bipap, but mental status continued to worsen and PCCM consulted for possible re-intubation.  History of present illness   Brendan [name] is a 64 yo male withpoorly controlled diabetes complicated by neuropathy and hypertension who presented to an outside hospital on 3/26 with a chief complaint of general malaise and shortness of breath for the past 4 days. He is now transferred to [facility] for evaluation of troponin elevation, evaluation of newly depressed EF,and consideration for coronary angiography.  History obtained from patient report as well as discharge paperwork from outside hospital. Briefly, patient reportedly had nausea, vomiting, diarrhea approximately 1 week and is confined to his bed. He finally presented to the outside hospital ED he was found to have a blood sugar of 516, BUN of 53, creatinine 1.2, and anion gap of 17. Patient's BNP was elevated to 8900. Troponin was elevated to 13.1 with changes noted by the team of their lateral leads as well as V1 and V2.  Patient was started on aspirin and a heparin drip. Chest x-ray on admission was concerning for bilateral pulmonary opacities differential including infection and pulmonary edema. So he was started on empiric ceftriaxone and doxycycline, although his procalcitonin was negative. He had a chest wall echo that was performed that showed an EF of 25% was diuresed during his admission with improvement in his shortness of breath as well as radiographically. On discussion with patient he has been chest pain-free since his admission totheoutside hospital. In addition, he feels that his shortness of breath is much improved.the  Patient had left heart cath on 04/11/2019 showing severe three-vessel disease. 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 He then developed worsening cardiogenic shock and was intubated on 4/1 andplaced on a IABP on 4/02. He also had bilateral chest tubes placed with3 L removed from each side. He was then extubated on 4/4. On 4/6 patient had CABG/MVRperformed and was extubated on 4/7 with improved mentation with weaning of sedation and pressors. Patient had Impella removed on 4/9 and subsequent TEE showing an EF of 35%with trivial MR s/p MV repair. Remained on BiPAP at this time.On 4/10 nephrology was consulted due to anuric sinceCABG and started on CRRT.Developing worsening shortness of breath and increased work of breathing on BiPAP with hypoxia on ABG and subsequently reintubated on 4/12.  Patient was extubated on 4/14 and developed worsening hypoxia on BiPAP. Overnight he developed pulseless VT requiring 1 minute of CPR and epinephrine to achieve ROSC. Neurovascularly intact afterward. Subsequently had increased work of breathing and worsening respiratory distress requiring intubation.  Patient had tracheostomy performed on  05/01/2019.  Past Medical History  Tobacco abuse, HTN, poorly controlled diabetes, diabetic  neuropathy  Significant Hospital Events   3/30 Lt heart cath/TEE performed 4/1 Intubated, RHC/IABP/PA cath 4/2 Impella placement 4/3 febrile over evening and night. Blood cultures obtained. Vanc/cefepime started. Hematuria--heparin held. 4/4 Extubated,abx transitioned to rocephin. Heparin restarted 4/6 Re-intubated for CABG/MVR 4/7 Extubated from CABG to BiPAP 4/9 Impella removed  4/9 TEE 4/10 CVC inserted &CRRT initiated  4/12 PCCMre-consulted re-intubation 4/14 Extubated to BiPAP 4/15 Re-intubated due to respiratory distress  Consults:  TCTS PCCM HF  Procedures:  4/15ET tube - 4/15 4/9 Cortrak> 4/6 Rt IJ> 4/10 HD cath- 4/13 4/12 foley cath> 3/30 PICC> 4/13 HD cath> 4/16 Tracheostomy>  Significant Diagnostic Tests:  3/30 R/ LHC >>  Ost LM to Mid LM lesion is 35% stenosed.  Prox LAD lesion is 90% stenosed.  Prox LAD to Mid LAD lesion is 50% stenosed.  Mid Cx lesion is 100% stenosed.  Prox RCA to Mid RCA lesion is 90% stenosed.  RPDA lesion is 95% stenosed.  LV end diastolic pressure is severely elevated.  Hemodynamic findings consistent with moderate pulmonary hypertension. 1. Severe 3 vessel obstructive CAD 2. High LV filling pressures 3. Reduced cardiac output with index 2.3 4. Moderate pulmonary HTN.  3/30TTE >> 1. Left ventricular ejection fraction, by estimation, is 30 to 35%. The left ventricle has moderately decreased function. The left ventricle demonstrates regional wall motion abnormalities.There is mild left ventricular hypertrophy. Left ventricular diastolic function could not be evaluated. There is severe hypokinesis of the left ventricular, entire inferior wall, inferoseptal wall, apical segment and lateral wall.  2. Right ventricular systolic function is hyperdynamic. The right ventricular size is normal. There is moderately elevated pulmonary artery systolic pressure.  3. Decreased posterior leaflet motion due to ischemic tethering of  the mitral valve leaflets.  4. The mitral valve is abnormal. Moderate to severe mitral valve regurgitation.  5. The aortic valve is tricuspid. Aortic valve regurgitation is not visualized. Mild aortic valve sclerosis is present, with no evidence of aortic valve stenosis.  6. The inferior vena cava is normal in size with greater than 50% respiratory variability, suggesting right atrial pressure of 3 mmHg.   4/1 CT chest w/o >>Large bilateral pleural effusions with associated atelectasis.Moderate to severe bilateral ground-glass opacities, likely reflecting pneumonia and/or edema.  4/1 RHC >> RA = 18 RV = 60/20  PA = 63/29 (42) PCW = 33 Fick cardiac output/index = 3.7/1.9 PVR = 2.5 WU FA sat = 98% PA sat = 47%, 49% PaPi = 2.2  4/2 CXR>bilateral chest tubes present. Significantly improved since morning xray. Infiltrate vs atelectasis at right base.  04/12/21CXR>>vascular congestion and extensive bilateral airspace disease, probably reflecting edema  04/29/19 CXR>>1. New left subclavian dialysis catheter without complicating feature. 2. Interval removal of the left-sided chest tube. No pneumothorax. 3. Unchanged pulmonary edema.  05/01/2019 CXR>Radiographically appropriate position of endotracheal tube.  4/16 CXR> Stable support apparatus. Bilateral lung opacities are noted, right greater than left, concerning for worsening pneumonia or edema. No pneumothorax is noted.  Micro Data:  3/30 MRSA PCR >> neg 4/1 MRSA PCR >> neg 4/2 resp culture>>rare gram pos cocci, few candida albicans 4/3 Urine culture>>negative 4/4 blood cultures>>no growth  4/9 Surgical wound>> rare enterococcus faecalis 4/9 BCx2>>no gorwth 4/9 Urine Culture>> greater than 100k yeast  Antimicrobials:  PTA ceftriaxone/ doxycycline >> 3/29 Ceftriaxone 4/4-4/5 Cefepime 4/4>>4/11 vanc 4/4>>4/6 Rocephin 4/4>>4/5 Cefuroxime 4/6-4/7 Ampicillin 4/11 Vancomycin 4/12>>4/13 Cefepime  4/12>>4/13 Augmentin 4/15>  Interim history/subjective:  Patient is resting comfortably in bed on ventilator. Less sedated today and able to follow simple commands.  Objective   Blood pressure (!) 106/55, pulse 76, temperature (!) 96.8 F (36 C), resp. rate 13, height _0  (1.803 m), weight 68.5 kg, SpO2 100 %. CVP:  [5 mmHg-6 mmHg] 6 mmHg  Vent Mode: PRVC FiO2 (%):  [40 %-50 %] 40 % Set Rate:  [23 bmp] 23 bmp Vt Set:  [450 mL] 450 mL PEEP:  [8 cmH20] 8 cmH20 Plateau Pressure:  [8 cmH20-21 cmH20] 18 cmH20   Intake/Output Summary (Last 24 hours) at 05/03/2019 0532 Last data filed at 05/03/2019 0500 Gross per 24 hour  Intake 3872.85 ml  Output 3890 ml  Net -17.15 ml   Filed Weights   05/01/19 0554 04/28/2019 0400 05/03/19 0300  Weight: 67.8 kg 67.5 kg 68.5 kg    Examination: Physical Exam  Constitutional: No distress.  HENT:  Head: Normocephalic and atraumatic.  Eyes: EOM are normal.  Cardiovascular: Normal rate and intact distal pulses.  No murmur heard. Pulmonary/Chest: Effort normal. No respiratory distress. He has wheezes. He has rales.  On ventilator  Abdominal: Soft. He exhibits no distension.  Musculoskeletal:        General: Edema present.  Neurological: He is alert.  Able to follow commands and move all four extremities  Skin: Skin is warm. He is not diaphoretic.    Resolved Hospital Problem list     Assessment & Plan:  Acute hypoxic respiratory failure, multifactorial - Tracheostomy yesterday 2/2 to failed SBT with hypoxemia, pulseless VT arrest, CXR pending  - Titrate Trach collar to maintain SPO2 greater than or equal to 90%,HOB elevated to 30 degrees - BAL grew rare enterococcus, on 7 day course of Augmentin.  - On day 3 of Augmentin, continue for 7 days for aspiration pneumonia. - CXR yesterday shows bilateral lung opacities, R>L concerning for worsening pneumonia. - ABG shows pH of 7.4, pCO2 39.9, pO2 135 - D/c precedex, prn fentanyl for  pain.  HFrEF (20-25%), cardiogenic shock, NSTEMI with three-vessel disease, NSVT - Will add midodrine 5 mg tid, continue milrinone  - Wean pressors as able - On CRRT - Continue amiodarone gttper primary team.   Polymorphic VT - Recurrent VT during respiratory distress 4/14, on amiodarone gtt again. - no further VT on tele  - Replete electrolytes as needed  Severe Malnutrition - Albumin 1.7 - Nutrition on board - Continue TFs  AKI possibly ATN in the setting of cardiogenic shock, anuric  Cr stable at 1.56 -Continue CRRT per nephrology,  - Replete electrolytes as needed  Diabetes mellitus -SSI -Continue Lantus 8 units twice daily - Patient placed on D50 pushes and D10 drip while tube feeds being held, resume tube feeds today.  Elevated transaminase enzymes with cholestasis pattern - AST/ALT elevated in the setting of cardiogenic shock  - GGT elevated - RUQ US shows previous cholecystectomy, without bile duct dilation.   Best practice:  Diet: Tube feeds Pain/Anxiety/Delirium protocol (if indicated):Fentanyl VAP protocol (if indicated):HOB 30 degrees,  DVT prophylaxis: heparin  GI prophylaxis: PPI Glucose control:SSI Mobility: BR Code Status: Full  Family Communication:will reach out to family today. Disposition:ICU  Labs   CBC: Recent Labs  Lab 04/30/19 0436 04/30/19 1116 05/01/19 0449 05/01/19 0449 05/01/19 1731 05/16/2019 0334 04/29/2019 1526 04/27/2019 1535 05/03/19 0317  WBC 10.8*  --  16.6*  --   --  16.6* 21.1*  --  20.0*  HGB 7.9*   < >  9.3*   < > 9.2* 7.9* 8.4* 9.5* 7.7*  HCT 24.4*   < > 28.3*   < > 27.0* 24.2* 25.3* 28.0* 24.0*  MCV 86.2  --  87.1  --   --  87.1 86.6  --  86.3  PLT 211  --  212  --   --  187 227  --  258   < > = values in this interval not displayed.    Basic Metabolic Panel: Recent Labs  Lab 04/29/19 0444 04/29/19 0656 04/30/19 0436 04/30/19 1116 05/01/19 0449 05/01/19 0449 05/01/19 1547 05/01/19 1547  05/01/19 1731 05/05/2019 0334 04/24/2019 1526 05/16/2019 1535 05/03/19 0317  NA 135   < > 136   < > 136   < > 136   < > 136 135 133* 134* 135  K 5.0   < > 4.9   < > 4.8   < > 5.0   < > 4.9 4.0 4.9 4.6 4.4  CL 102   < > 102   < > 102  --  102  --   --  101 101  --  102  CO2 21*   < > 23   < > 24  --  23  --   --  23 23  --  23  GLUCOSE 167*   < > 134*   < > 135*  --  161*  --   --  188* 240*  --  147*  BUN 27*   < > 28*   < > 29*  --  28*  --   --  26* 29*  --  29*  CREATININE 1.63*   < > 1.54*   < > 1.58*  --  1.46*  --   --  1.28* 1.49*  --  1.54*  CALCIUM 7.2*   < > 7.3*   < > 7.3*  --  7.7*  --   --  7.1* 7.0*  --  7.1*  MG 2.5*  --  2.5*  --  2.6*  --   --   --   --  2.7*  --   --  2.4  PHOS 1.6*   < > 1.5*   < > 2.1*  --  1.3*  --   --  1.3* 4.4  --  2.2*   < > = values in this interval not displayed.   GFR: Estimated Creatinine Clearance: 48.8 mL/min (A) (by C-G formula based on SCr of 1.54 mg/dL (H)). Recent Labs  Lab 04/27/19 0906 04/27/19 0944 05/01/19 0449 05/04/2019 0334 04/26/2019 1526 05/03/19 0317  PROCALCITON 6.93  --   --   --   --   --   WBC  --    < > 16.6* 16.6* 21.1* 20.0*   < > = values in this interval not displayed.    Liver Function Tests: Recent Labs  Lab 04/28/19 0411 04/28/19 1622 04/29/19 0444 04/29/19 1644 04/30/19 0436 04/30/19 1600 05/01/19 0449 05/01/19 1547 04/26/2019 0334 05/10/2019 1526 05/03/19 0317  AST 286*  --  167*  --  180*  --  301*  --   --   --  361*  ALT 236*  --  242*  --  198*  --  231*  --   --   --  222*  ALKPHOS 429*  --  499*  --  488*  --  501*  --   --   --  558*  BILITOT 1.1  --  1.0  --  1.0  --  0.8  --   --   --  0.8  PROT 5.0*  --  5.3*  --  5.2*  --  5.1*  --   --   --  5.3*  ALBUMIN 1.7*  1.6*   < > 1.5*  1.5*   < > 1.3*  1.4*   < > 1.4*  1.4* 1.4* 1.4* 1.4* 1.7*   < > = values in this interval not displayed.   No results for input(s): LIPASE, AMYLASE in the last 168 hours. No results for input(s): AMMONIA in  the last 168 hours.  ABG    Component Value Date/Time   PHART 7.409 05/03/2019 0324   PCO2ART 39.9 05/03/2019 0324   PO2ART 135 (H) 05/03/2019 0324   HCO3 25.0 05/03/2019 0324   TCO2 26 05/14/2019 1535   ACIDBASEDEF 3.0 (H) 04/28/2019 0735   O2SAT 98.7 05/03/2019 0324     Coagulation Profile: Recent Labs  Lab 05/01/19 0449  INR 1.6*    Cardiac Enzymes: No results for input(s): CKTOTAL, CKMB, CKMBINDEX, TROPONINI in the last 168 hours.  HbA1C: HbA1c, POC (controlled diabetic range)  Date/Time Value Ref Range Status  01/14/2019 02:02 PM 12.5 (A) 0.0 - 7.0 % Final  08/21/2017 02:03 PM 8.5 (A) 0.0 - 7.0 % Final   Hgb A1c MFr Bld  Date/Time Value Ref Range Status  03/19/2019 09:49 PM 9.0 (H) 4.8 - 5.6 % Final    Comment:    (NOTE) Pre diabetes:          5.7%-6.4% Diabetes:              >6.4% Glycemic control for   <7.0% adults with diabetes     CBG: Recent Labs  Lab 05/01/2019 1231 04/19/2019 1510 05/05/2019 1938 05/03/19 0031 05/03/19 0317  GLUCAP 212* 222* 232* 201* 135*    Review of Systems:   Negative except what is noted in subjective.  Past Medical History  He,  has a past medical history of Cholecystitis (07/6158), Complication of anesthesia, Diabetes mellitus without complication ([place]), Erectile dysfunction (2010), HTN (hypertension), NSTEMI (non-ST elevated myocardial infarction) ([place]) (04/09/2019), and PONV (postoperative nausea and vomiting).   Surgical History    Past Surgical History:  Procedure Laterality Date  . APPENDECTOMY  1969  . BRONCHIAL WASHINGS N/A 04/28/2019   Procedure: Bronchial Washings;  Surgeon: Ivin Poot, MD;  Location: [place];  Service: Open Heart Surgery;  Laterality: N/A;  . CHOLECYSTECTOMY    . CHOLECYSTECTOMY N/A 04/02/2013   Procedure: LAPAROSCOPIC CHOLECYSTECTOMY WITH  INTRAOPERATIVE CHOLANGIOGRAM;  Surgeon: Joyice Faster. Cornett, MD;  Location: [place];  Service: General;  Laterality: N/A;  . CORONARY ARTERY BYPASS GRAFT N/A  04/28/2019   Procedure: CORONARY ARTERY BYPASS GRAFTING (CABG) x3, USING LEFT INTERNAL MAMMARY ARTERY AND LEFT LEG GREATER SAPHENOUS VEIN HARVESTED ENDOSCOPICALLY;  Surgeon: Ivin Poot, MD;  Location: [place];  Service: Open Heart Surgery;  Laterality: N/A;  Inhaled Nitric-Oxide  . ERCP N/A 04/02/2013   Procedure: ENDOSCOPIC RETROGRADE CHOLANGIOPANCREATOGRAPHY (ERCP);  Surgeon: Inda Castle, MD;  Location: [place];  Service: Endoscopy;  Laterality: N/A;  . 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   . IABP INSERTION N/A 05/13/2019   Procedure: IABP INSERTION;  Surgeon: Jolaine Artist, MD;  Location: [place] [facility];  Service: Cardiovascular;  Laterality: N/A;  . MITRAL VALVE REPLACEMENT N/A 04/24/2019   Procedure: MITRAL  VALVE (MV) REPAIR USING PHYSIO II RING SIZE 26MM;  Surgeon: Prescott Gum, Collier Salina, MD;  Location: [place];  Service: Open Heart Surgery;  Laterality: N/A;  . PLACEMENT OF IMPELLA LEFT VENTRICULAR ASSIST DEVICE Right 05/01/2019   Procedure: Placement of Impella 5.5 Direct;  Surgeon: Ivin Poot, MD;  Location: [place];  Service: Open Heart Surgery;  Laterality: Right;  . REMOVAL OF IMPELLA LEFT VENTRICULAR ASSIST DEVICE N/A 04/23/2019   Procedure: REMOVAL OF IMPELLA 5.5 direct, LEFT VENTRICULAR ASSIST [place], TEE;  Surgeon: Ivin Poot, MD;  Location: [place];  Service: Open Heart Surgery;  Laterality: N/A;  . RIGHT HEART CATH N/A 04/28/2019   Procedure: RIGHT HEART CATH;  Surgeon: Jolaine Artist, MD;  Location: [place] [facility];  Service: Cardiovascular;  Laterality: N/A;  . RIGHT/LEFT HEART CATH AND CORONARY ANGIOGRAPHY N/A 04/13/2019   Procedure: RIGHT/LEFT HEART CATH AND CORONARY ANGIOGRAPHY;  Surgeon: Martinique, Peter M, MD;  Location: [place] [facility];  Service: Cardiovascular;  Laterality: N/A;  . TEE WITHOUT CARDIOVERSION N/A 05/11/2019   Procedure: TRANSESOPHAGEAL ECHOCARDIOGRAM (TEE);  Surgeon: Prescott Gum, Collier Salina, MD;  Location: [name];  Service: Open Heart Surgery;  Laterality: N/A;  . TEE WITHOUT CARDIOVERSION N/A 05/08/2019   Procedure: TRANSESOPHAGEAL ECHOCARDIOGRAM (TEE);  Surgeon: Prescott Gum, Collier Salina, MD;  Location: [place];  Service: Open Heart Surgery;  Laterality: N/A;     Social History   reports that he has never smoked. His smokeless tobacco use includes snuff and chew. He reports previous alcohol use. He reports that he does not use drugs.   Family History   His family history includes Colon cancer in his maternal aunt; Diabetes in his maternal grandmother; Stroke in his maternal grandmother.   Allergies Allergies  Allergen Reactions  . Acetaminophen Itching     Home Medications  Prior to Admission medications   Medication Sig Start Date End Date Taking? Authorizing Provider  Insulin Glargine (BASAGLAR KWIKPEN) 100 UNIT/ML Inject 28 Units into the skin at bedtime.   Yes [provider]  insulin lispro (HUMALOG) 100 UNIT/ML injection Inject 8 Units into the skin 2 (two) times daily before a meal.   Yes [provider]  metFORMIN (GLUCOPHAGE) 500 MG tablet Take 1 tablet (500 mg total) by mouth 2 (two) times daily with a meal. 06/07/17  Yes Hensel, Jamal Collin, MD  glucose blood (RELION GLUCOSE TEST STRIPS) test strip Use as instructed 02/24/19   [name], MD  Insulin Pen Needle (PEN NEEDLES) 32G X 4 MM MISC     [provider]  ReliOn Ultra Thin Lancets MISC Use to test blood sugar up to 4 times daily. 07/28/13   Coral Spikes, DO     Critical care time:      Marianna Payment, D.O. [facility], PGY-1 Pager: [phone], Phone: [phone] Date 05/03/2019 Time 8:35 AM   PCCM attending:  64 year old gentlemanAdmitted for NSTEMI, taken for cardiac catheterization found to have severe three-vessel coronary artery disease.  Decision was made for CABG.  Patient was taken to the OR on 05/14/2019 extubated in the ICU on 04/23/2019.   Hospital course complicated by Impella use.  Reduced ejection fraction with EF of 35%.  Remained in cardiogenic shock.  Patient was reintubated on 04/28/2019 extubated again on 04/29/2020 1.  Patient had pulseless VT arrest over the following night and was placed back on mechanical support.  Decision was made for tracheostomy tube placement on 05/15/2019 by cardiothoracic  surgery.  Patient seen in intensive care unit this morning.  Pulmonary mechanics are good on minimal pressure support.  Decision made to transfer position to trach collar.  Also needs for cutting continuous sedation.  BP (!) 98/45   Pulse (!) 41   Temp 97.7 F (36.5 C)   Resp 18   Ht _0  (1.803 m) Comment: measured x 3  Wt 68.5 kg   SpO2 98%   BMI 21.06 kg/m   General: Chronically ill debilitated gentleman on mechanical support. HEENT: Will open eyes to voice however still sedated Heart: Regular rhythm S1-S2 Lungs: Bilateral mechanically ventilated breaths on pressure support. Extremities: No significant edema  Labs: Reviewed Chest x-ray: PACS system is down.  Unable to view this this morning.  We will look at a later whenever the PACS system is back up.  Assessment: Acute on chronic hypoxemic respiratory failure requiring tracheostomy tube placement. NSTEMI, coronary artery disease status post CABG complicated by cardiogenic shock, acute on chronic systolic heart failure, polymorphic VT. AKI secondary to above.  Plan: From a vent management standpoint patient's mechanics looks like he should tolerate trach collar trial. We will plan to liberate from mechanical support and place patient on trach collar today. If he needs additional time on vent we can reconnect. Likely plan to put back on at night time Complete 7 days of antimicrobials this is already been started he is on day 3 of Augmentin. CVVHD for volume removal Continue amiodarone and milrinone per CT surgery. We will follow along for trach and vent  support.  Please do not hesitate to call with any questions or concerns.  This patient is critically ill with multiple organ system failure; which, requires frequent high complexity decision making, assessment, support, evaluation, and titration of therapies. This was completed through the application of advanced monitoring technologies and extensive interpretation of multiple databases. During this encounter critical care time was devoted to patient care services described in this note for 32 minutes.  [facility] 05/03/2019 9:05 AM

## 2019-05-03 NOTE — Plan of Care (Signed)
  Problem: Education: Goal: Knowledge of General Education information will improve Description: Including pain rating scale, medication(s)/side effects and non-pharmacologic comfort measures Outcome: Progressing   Problem: Health Behavior/Discharge Planning: Goal: Ability to manage health-related needs will improve Outcome: Progressing   Problem: Clinical Measurements: Goal: Ability to maintain clinical measurements within normal limits will improve Outcome: Progressing Goal: Will remain free from infection Outcome: Progressing Goal: Diagnostic test results will improve Outcome: Progressing Goal: Respiratory complications will improve Outcome: Progressing Goal: Cardiovascular complication will be avoided Outcome: Progressing   Problem: Activity: Goal: Risk for activity intolerance will decrease Outcome: Progressing   Problem: Nutrition: Goal: Adequate nutrition will be maintained Outcome: Progressing   Problem: Coping: Goal: Level of anxiety will decrease Outcome: Progressing   Problem: Elimination: Goal: Will not experience complications related to bowel motility Outcome: Progressing Goal: Will not experience complications related to urinary retention Outcome: Progressing   Problem: Pain Managment: Goal: General experience of comfort will improve Outcome: Progressing   Problem: Safety: Goal: Ability to remain free from injury will improve Outcome: Progressing   Problem: Skin Integrity: Goal: Risk for impaired skin integrity will decrease Outcome: Progressing   Problem: Education: Goal: Will demonstrate proper wound care and an understanding of methods to prevent future damage Outcome: Progressing Goal: Knowledge of disease or condition will improve Outcome: Progressing Goal: Knowledge of the prescribed therapeutic regimen will improve Outcome: Progressing Goal: Individualized Educational Video(s) Outcome: Progressing   Problem: Activity: Goal: Risk for  activity intolerance will decrease Outcome: Progressing   Problem: Cardiac: Goal: Will achieve and/or maintain hemodynamic stability Outcome: Progressing   Problem: Clinical Measurements: Goal: Postoperative complications will be avoided or minimized Outcome: Progressing   Problem: Respiratory: Goal: Respiratory status will improve Outcome: Progressing   Problem: Skin Integrity: Goal: Wound healing without signs and symptoms of infection Outcome: Progressing Goal: Risk for impaired skin integrity will decrease Outcome: Progressing   Problem: Urinary Elimination: Goal: Ability to achieve and maintain adequate renal perfusion and functioning will improve Outcome: Progressing   Problem: Education: Goal: Knowledge about tracheostomy care/management will improve Outcome: Progressing   Problem: Activity: Goal: Ability to tolerate increased activity will improve Outcome: Progressing   Problem: Health Behavior/Discharge Planning: Goal: Ability to manage tracheostomy will improve Outcome: Progressing   Problem: Respiratory: Goal: Patent airway maintenance will improve Outcome: Progressing   Problem: Role Relationship: Goal: Ability to communicate will improve Outcome: Progressing

## 2019-05-04 ENCOUNTER — Inpatient Hospital Stay (HOSPITAL_COMMUNITY): Payer: Medicaid Other

## 2019-05-04 DIAGNOSIS — Z9911 Dependence on respirator [ventilator] status: Secondary | ICD-10-CM

## 2019-05-04 DIAGNOSIS — J9610 Chronic respiratory failure, unspecified whether with hypoxia or hypercapnia: Secondary | ICD-10-CM

## 2019-05-04 LAB — RENAL FUNCTION PANEL
Albumin: 2 g/dL — ABNORMAL LOW (ref 3.5–5.0)
Albumin: 1.9 g/dL — ABNORMAL LOW (ref 3.5–5.0)
Anion gap: 10 (ref 5–15)
Anion gap: 11 (ref 5–15)
BUN: 21 mg/dL (ref 8–23)
BUN: 20 mg/dL (ref 8–23)
CO2: 24 mmol/L (ref 22–32)
CO2: 25 mmol/L (ref 22–32)
Calcium: 7 mg/dL — ABNORMAL LOW (ref 8.9–10.3)
Calcium: 7.4 mg/dL — ABNORMAL LOW (ref 8.9–10.3)
Chloride: 100 mmol/L (ref 98–111)
Chloride: 102 mmol/L (ref 98–111)
Creatinine, Ser: 1.21 mg/dL (ref 0.61–1.24)
Creatinine, Ser: 1.13 mg/dL (ref 0.61–1.24)
GFR calc Af Amer: >60 mL/min (ref >60)
GFR calc Af Amer: >60 mL/min (ref >60)
GFR calc non Af Amer: >60 mL/min (ref >60)
GFR calc non Af Amer: >60 mL/min (ref >60)
Glucose, Bld: 198 mg/dL — ABNORMAL HIGH (ref 70–99)
Glucose, Bld: 123 mg/dL — ABNORMAL HIGH (ref 70–99)
Phosphorus: 2 mg/dL — ABNORMAL LOW (ref 2.5–4.6)
Phosphorus: 4.1 mg/dL (ref 2.5–4.6)
Potassium: 4.4 mmol/L (ref 3.5–5.1)
Potassium: 4.4 mmol/L (ref 3.5–5.1)
Sodium: 134 mmol/L — ABNORMAL LOW (ref 135–145)
Sodium: 138 mmol/L (ref 135–145)

## 2019-05-04 LAB — TYPE AND SCREEN
ABO/RH(D): O NEG
Antibody Screen: NEGATIVE
Unit division: 0
Unit division: 0
Unit division: 0
Unit division: 0
Unit division: 0

## 2019-05-04 LAB — GLUCOSE, CAPILLARY
Glucose-Capillary: 101 mg/dL — ABNORMAL HIGH (ref 70–99)
Glucose-Capillary: 133 mg/dL — ABNORMAL HIGH (ref 70–99)
Glucose-Capillary: 147 mg/dL — ABNORMAL HIGH (ref 70–99)
Glucose-Capillary: 170 mg/dL — ABNORMAL HIGH (ref 70–99)
Glucose-Capillary: 174 mg/dL — ABNORMAL HIGH (ref 70–99)
Glucose-Capillary: 252 mg/dL — ABNORMAL HIGH (ref 70–99)

## 2019-05-04 LAB — MAGNESIUM: Magnesium: 2.4 mg/dL (ref 1.7–2.4)

## 2019-05-04 LAB — POCT I-STAT 7, (LYTES, BLD GAS, ICA,H+H)
Acid-Base Excess: 3 mmol/L — ABNORMAL HIGH (ref 0.0–2.0)
Bicarbonate: 26.3 mmol/L (ref 20.0–28.0)
Calcium, Ion: 1.12 mmol/L — ABNORMAL LOW (ref 1.15–1.40)
HCT: 27 % — ABNORMAL LOW (ref 39.0–52.0)
Hemoglobin: 9.2 g/dL — ABNORMAL LOW (ref 13.0–17.0)
O2 Saturation: 97 %
Patient temperature: 36.4
Potassium: 4.7 mmol/L (ref 3.5–5.1)
Sodium: 136 mmol/L (ref 135–145)
TCO2: 27 mmol/L (ref 22–32)
pCO2 arterial: 34.2 mmHg (ref 32.0–48.0)
pH, Arterial: 7.492 — ABNORMAL HIGH (ref 7.350–7.450)
pO2, Arterial: 82 mmHg — ABNORMAL LOW (ref 83.0–108.0)

## 2019-05-04 LAB — BPAM RBC
Blood Product Expiration Date: 202104212359
Blood Product Expiration Date: 202104222359
Blood Product Expiration Date: 202105082359
Blood Product Expiration Date: 202105122359
Blood Product Expiration Date: 202105132359
ISSUE DATE / TIME: 202104120733
ISSUE DATE / TIME: 202104120733
ISSUE DATE / TIME: 202104140952
ISSUE DATE / TIME: 202104170843
Unit Type and Rh: 5100
Unit Type and Rh: 5100
Unit Type and Rh: 9500
Unit Type and Rh: 9500
Unit Type and Rh: 9500

## 2019-05-04 LAB — COOXEMETRY PANEL
Carboxyhemoglobin: 1.5 % (ref 0.5–1.5)
Methemoglobin: 1.4 % (ref 0.0–1.5)
O2 Saturation: 64 %
Total hemoglobin: 8 g/dL — ABNORMAL LOW (ref 12.0–16.0)

## 2019-05-04 LAB — COMPREHENSIVE METABOLIC PANEL
ALT: 239 U/L — ABNORMAL HIGH (ref 0–44)
AST: 286 U/L — ABNORMAL HIGH (ref 15–41)
Albumin: 2.1 g/dL — ABNORMAL LOW (ref 3.5–5.0)
Alkaline Phosphatase: 529 U/L — ABNORMAL HIGH (ref 38–126)
Anion gap: 10 (ref 5–15)
BUN: 22 mg/dL (ref 8–23)
CO2: 24 mmol/L (ref 22–32)
Calcium: 7.2 mg/dL — ABNORMAL LOW (ref 8.9–10.3)
Chloride: 102 mmol/L (ref 98–111)
Creatinine, Ser: 1.24 mg/dL (ref 0.61–1.24)
GFR calc Af Amer: >60 mL/min (ref >60)
GFR calc non Af Amer: >60 mL/min (ref >60)
Glucose, Bld: 179 mg/dL — ABNORMAL HIGH (ref 70–99)
Potassium: 4.6 mmol/L (ref 3.5–5.1)
Sodium: 136 mmol/L (ref 135–145)
Total Bilirubin: 1.2 mg/dL (ref 0.3–1.2)
Total Protein: 5.4 g/dL — ABNORMAL LOW (ref 6.5–8.1)

## 2019-05-04 LAB — CBC
HCT: 24.8 % — ABNORMAL LOW (ref 39.0–52.0)
Hemoglobin: 8 g/dL — ABNORMAL LOW (ref 13.0–17.0)
MCH: 28.5 pg (ref 26.0–34.0)
MCHC: 32.3 g/dL (ref 30.0–36.0)
MCV: 88.3 fL (ref 80.0–100.0)
Platelets: 266 10*3/uL (ref 150–400)
RBC: 2.81 MIL/uL — ABNORMAL LOW (ref 4.22–5.81)
RDW: 17.7 % — ABNORMAL HIGH (ref 11.5–15.5)
WBC: 20.8 10*3/uL — ABNORMAL HIGH (ref 4.0–10.5)
nRBC: 0.1 % (ref 0.0–0.2)

## 2019-05-04 MED ORDER — AMIODARONE HCL 200 MG PO TABS
200.0000 mg | ORAL_TABLET | Freq: Every day | ORAL | Status: DC
  Administered 2019-05-04 – 2019-05-06 (×3): 200 mg via ORAL
  Filled 2019-05-04 (×3): qty 1

## 2019-05-04 MED ORDER — SODIUM PHOSPHATES 45 MMOLE/15ML IV SOLN
30.0000 mmol | Freq: Once | INTRAVENOUS | Status: AC
  Administered 2019-05-04: 30 mmol via INTRAVENOUS
  Filled 2019-05-04: qty 10

## 2019-05-04 NOTE — Progress Notes (Signed)
CT surgery p.m. Rounds  Patient appears stronger and more engaged less sedated Tolerated trach collar all day will rest on ventilator tonight P.m. labs satisfactory potassium 4.4, CO2 25, albumin 1.9 Continue enteral nutrition, antibiotics, and physical therapy

## 2019-05-04 NOTE — Procedures (Signed)
Admit: 04/08/2019 LOS: 20  89M dialysis dependent AKI; cardiogenic shock status post CABG + MV replacement 4/2  Current CRRT Prescription: Start Date: 04/26/19 Catheter: L  Temp HD Cath BFR: 200 Pre Blood Pump: 400 4K DFR: 1800 4K Replacement Rate: 200 4K Goal UF: 35m/h neg Anticoagulation: 10045mh hep fixed Clotting: infrequent   S:  K 4.7, P 2.0  0.8L neg yesterday  NE, milrinone, VP gtt  TC this AM  O: 04/17 0701 - 04/18 0700 In: 4491.5 [I.V.:1471.9; Blood:315; NG/GT:1645; IV Piggyback:1059.6] Out: 5270   Filed Weights   05/03/19 0300 05/04/19 0000 05/04/19 0256  Weight: 68.5 kg 65.8 kg 65.8 kg    Recent Labs  Lab 05/03/19 0317 05/03/19 0317 05/03/19 1650 05/04/19 0303 05/04/19 0422  NA 135   < > 134* 136  134* 136  K 4.4   < > 4.6 4.6  4.4 4.7  CL 102  --  101 102  100  --   CO2 23  --  23 24  24   --   GLUCOSE 147*  --  241* 179*  198*  --   BUN 29*  --  26* 22  21  --   CREATININE 1.54*  --  1.27* 1.24  1.21  --   CALCIUM 7.1*  --  6.9* 7.2*  7.0*  --   PHOS 2.2*  --  2.9 2.0*  --    < > = values in this interval not displayed.   Recent Labs  Lab 05/06/2019 1526 05/14/2019 1535 05/03/19 0317 05/03/19 0317 05/03/19 1356 05/04/19 0303 05/04/19 0422  WBC 21.1*  --  20.0*  --   --  20.8*  --   HGB 8.4*   < > 7.7*   < > 8.1* 8.0* 9.2*  HCT 25.3*   < > 24.0*   < > 24.9* 24.8* 27.0*  MCV 86.6  --  86.3  --   --  88.3  --   PLT 227  --  258  --   --  266  --    < > = values in this interval not displayed.    Scheduled Meds: . aspirin EC  325 mg Oral Daily   Or  . aspirin  324 mg Per Tube Daily  . B-complex with vitamin C  1 tablet Per Tube Daily  . bisacodyl  10 mg Oral Daily   Or  . bisacodyl  10 mg Rectal Daily  . chlorhexidine gluconate (MEDLINE KIT)  15 mL Mouth Rinse BID  . Chlorhexidine Gluconate Cloth  6 each Topical Daily  . docusate  200 mg Per Tube Daily  . feeding supplement (PRO-STAT SUGAR FREE 64)  30 mL Per Tube BID   . gabapentin  300 mg Per Tube Q8H  . insulin aspart  0-24 Units Subcutaneous Q4H  . insulin aspart  4 Units Subcutaneous Q4H  . insulin glargine  10 Units Subcutaneous BID  . mouth rinse  15 mL Mouth Rinse 10 times per day  . midodrine  10 mg Per Tube TID WC  . pantoprazole sodium  40 mg Per Tube Daily  . pneumococcal 23 valent vaccine  0.5 mL Intramuscular Tomorrow-1000  . rosuvastatin  10 mg Per Tube q1800  . sodium chloride flush  10-40 mL Intracatheter Q12H   Continuous Infusions: .  prismasol BGK 4/2.5 400 mL/hr at 05/04/19 0056  .  prismasol BGK 4/2.5 200 mL/hr at 05/04/19 0426  . sodium chloride    . amiodarone  30 mg/hr (05/04/19 0700)  . ampicillin-sulbactam (UNASYN) IV Stopped (05/04/19 0021)  . epinephrine Stopped (04/28/19 0138)  . feeding supplement (VITAL 1.5 CAL) 1,000 mL (05/04/19 0604)  . feeding supplement (VITAL AF 1.2 CAL) 1,000 mL (04/24/2019 1354)  . fentaNYL infusion INTRAVENOUS Stopped (04/30/19 1211)  . heparin 10,000 units/ 20 mL infusion syringe 1,000 Units/hr (05/04/19 0200)  . lactated ringers Stopped (05/14/2019 1640)  . lactated ringers Stopped (04/30/19 0930)  . milrinone 0.125 mcg/kg/min (05/04/19 0700)  . norepinephrine (LEVOPHED) Adult infusion 23 mcg/min (05/04/19 0700)  . prismasol BGK 4/2.5 1,800 mL/hr at 05/04/19 0657  . vasopressin (PITRESSIN) infusion - *FOR SHOCK* 0.03 Units/min (05/04/19 0700)   PRN Meds:.Place/Maintain arterial line **AND** sodium chloride, alteplase, fentaNYL, fentaNYL (SUBLIMAZE) injection, fentaNYL (SUBLIMAZE) injection, heparin, levalbuterol, metoprolol tartrate, ondansetron (ZOFRAN) IV, ondansetron (ZOFRAN) IV, sodium chloride, sodium chloride flush  ABG    Component Value Date/Time   PHART 7.492 (H) 05/04/2019 0422   PCO2ART 34.2 05/04/2019 0422   PO2ART 82.0 (L) 05/04/2019 0422   HCO3 26.3 05/04/2019 0422   TCO2 27 05/04/2019 0422   ACIDBASEDEF 3.0 (H) 04/28/2019 0735   O2SAT 97.0 05/04/2019 0422    A/P   1. Dialysis dependent AKI, anuric 2. Hypophosphatemia, from CRRT, req intermittent repletion 3. Enterococcus faecalis pneumonia; augmentin 4. VDRF for trach 4/16 5. Cardiogenic shock 6. CAD status post CABG 7. Mitral regurgitation status post mitral valve repair 8. diabetes mellitus 2 9. Acute blood loss anemia; Hb stable 8-9s 10. Recurrent polymorphic ventricular tachycardia, on amiodarone  Cont all 4K baths.  Cont fixed dose hep in CRRT circuilt. Replete Phos this AM.  Follow CRRT labs.   Pearson Grippe, MD [facility]

## 2019-05-04 NOTE — Progress Notes (Signed)
Patient ID: Brendan [name], male   DOB: 10-Oct-1955, 64 y.o.   MRN: [id number]     Advanced Heart Failure Rounding Note  PCP-Cardiologist: No primary care provider on file.   Subjective:    Events - Presented to [facility] with acute HF  EF 20-25% - Transferred to [facility] - Cath 3/30 with severe 3 vessel disease. EF 25% - Developed PMVT on milrinone -> milrinone stopped -> developed worsening shock -> IABP placed on 4/1 - Clinical deterioration 4/1-> intubated - 4/2 underwent Impella 5.5 placement earlier and bilateral chest tubes with 3L out from each side.  - Extubated 4/4 - 05/09/2019 CABG and MVR - Extubated 4/7 - Back to OR for Impella 5.5 extraction 4/9, extubated.  TEE with EF 35%, RV ok, trivial MR s/p MV repair.  - CVVH started 4/10 with oliguria, rising creatinine and CVP.  - Deteriorating respiratory status, intubated again early am 4/12.  Enterococcus faecalis in sputum.  - Extubated 4/14.  Developed respiratory distress and started on Bipap.  Had VT arrest with CPR/epinephrine, intubated again 4/15 early am.  - Tracheostomy 4/16 - Atrial fibrillation early am 4/17  On vent overnight. Back on TC today. Very weak but will follow commands. Moves all 4.   On NE 38 -> 17, vasopressin 0.03, milrinone 0.125. Co-ox 64%.  Remains on CVVHD. Running -50. CVP 5-6  Hgb down from 9.2 >7.9>>7.7.   He is now on unasyn with E faecalis in sputum. Had 2 doses of fluconazole with candida in urine - now off. Afebrile (Tmax 98.8), WBCs stable at 20K.   Echo (4/10): EF 25-30%, normal RV, no significant MR.  I do not think there is an apical thrombus (think trabeculation).    Objective:   Weight Range: 65.8 kg Body mass index is 20.23 kg/m.   Vital Signs:   Temp:  [96.3 F (35.7 C)-98.8 F (37.1 C)] 96.3 F (35.7 C) (04/18 1100) Pulse Rate:  [28-127] 37 (04/18 1100) Resp:  [14-38] 16 (04/18 1100) BP: (78-119)/(34-73) 99/47 (04/18 1100) SpO2:  [87 %-100 %] 98 % (04/18 1100) FiO2 (%):   [35 %-40 %] 35 % (04/18 0804) Weight:  [65.8 kg] 65.8 kg (04/18 0256) Last BM Date: 05/04/19  Weight change: Filed Weights   05/03/19 0300 05/04/19 0000 05/04/19 0256  Weight: 68.5 kg 65.8 kg 65.8 kg    Intake/Output:   Intake/Output Summary (Last 24 hours) at 05/04/2019 1118 Last data filed at 05/04/2019 1113 Gross per 24 hour  Intake 4218.08 ml  Output 5055 ml  Net -836.92 ml      Physical Exam   General:  Sitting up in bed. Cachetic and weak. Mittens on  HEENT: normal + cortrak Neck: supple. no JVD.  + Trach Carotids 2+ bilat; no bruits. No lymphadenopathy or thryomegaly appreciated. + HD cath Cor: Sternal incision ok PMI nondisplaced. Regular rate & rhythm. No rubs, gallops or murmurs. Lungs: coarse  Abdomen: soft, nontender, nondistended. No hepatosplenomegaly. No bruits or masses. Good bowel sounds. Extremities: no cyanosis, clubbing, rash, edema Neuro: alert. follows commands, cranial nerves grossly intact. moves all 4 extremities w/o difficulty.    Telemetry   NSR 70s with PAC/PVCss.  No further AF overnight Personally reviewed   Labs    CBC Recent Labs    05/03/19 0317 05/03/19 1356 05/04/19 0303 05/04/19 0422  WBC 20.0*  --  20.8*  --   HGB 7.7*   < > 8.0* 9.2*  HCT 24.0*   < > 24.8* 27.0*  MCV 86.3  --  88.3  --   PLT 258  --  266  --    < > = values in this interval not displayed.   Basic Metabolic Panel Recent Labs    05/03/19 0317 05/03/19 0317 05/03/19 1650 05/03/19 1650 05/04/19 0303 05/04/19 0422  NA 135   < > 134*   < > 136  134* 136  K 4.4   < > 4.6   < > 4.6  4.4 4.7  CL 102   < > 101  --  102  100  --   CO2 23   < > 23  --  24  24  --   GLUCOSE 147*   < > 241*  --  179*  198*  --   BUN 29*   < > 26*  --  22  21  --   CREATININE 1.54*   < > 1.27*  --  1.24  1.21  --   CALCIUM 7.1*   < > 6.9*  --  7.2*  7.0*  --   MG 2.4  --   --   --  2.4  --   PHOS 2.2*   < > 2.9  --  2.0*  --    < > = values in this interval not  displayed.   Liver Function Tests Recent Labs    05/03/19 0317 05/03/19 0317 05/03/19 1650 05/04/19 0303  AST 361*  --   --  286*  ALT 222*  --   --  239*  ALKPHOS 558*  --   --  529*  BILITOT 0.8  --   --  1.2  PROT 5.3*  --   --  5.4*  ALBUMIN 1.7*   < > 2.0* 2.1*  2.0*   < > = values in this interval not displayed.   No results for input(s): LIPASE, AMYLASE in the last 72 hours. Cardiac Enzymes No results for input(s): CKTOTAL, CKMB, CKMBINDEX, TROPONINI in the last 72 hours.  BNP: BNP (last 3 results) Recent Labs    04/07/2019 0113  BNP 655.7*    ProBNP (last 3 results) No results for input(s): PROBNP in the last 8760 hours.   D-Dimer No results for input(s): DDIMER in the last 72 hours. Hemoglobin A1C No results for input(s): HGBA1C in the last 72 hours. Fasting Lipid Panel No results for input(s): CHOL, HDL, LDLCALC, TRIG, CHOLHDL, LDLDIRECT in the last 72 hours. Thyroid Function Tests No results for input(s): TSH, T4TOTAL, T3FREE, THYROIDAB in the last 72 hours.  Invalid input(s): FREET3  Other results:   Imaging    DG Chest Port 1 View  Result Date: 05/04/2019 CLINICAL DATA:  Status post mitral valve replacement EXAM: PORTABLE CHEST 1 VIEW COMPARISON:  Yesterday FINDINGS: Tracheostomy tube in satisfactory position. Stable left subclavian double-lumen catheter with its tip at the superior cavoatrial junction. Stable right PICC with its tip at the superior cavoatrial junction. Feeding tube extending into the stomach. Stable post CABG changes and prosthetic heart valve. Mild increase in prominence of the interstitial markings and areas of patchy opacity in the right mid lung zone in both lower lung zones. No visible pleural fluid. Thoracic spine degenerative changes. IMPRESSION: Mild increase in interstitial pulmonary edema and patchy alveolar edema or pneumonia in the right mid lung zone and both lower lung zones. Electronically Signed   By: Claudie Revering M.D.    On: 05/04/2019 10:22     Medications:  Scheduled Medications: . amiodarone  200 mg Oral Daily  . aspirin EC  325 mg Oral Daily   Or  . aspirin  324 mg Per Tube Daily  . B-complex with vitamin C  1 tablet Per Tube Daily  . bisacodyl  10 mg Oral Daily   Or  . bisacodyl  10 mg Rectal Daily  . chlorhexidine gluconate (MEDLINE KIT)  15 mL Mouth Rinse BID  . Chlorhexidine Gluconate Cloth  6 each Topical Daily  . docusate  200 mg Per Tube Daily  . feeding supplement (PRO-STAT SUGAR FREE 64)  30 mL Per Tube BID  . gabapentin  300 mg Per Tube Q8H  . insulin aspart  0-24 Units Subcutaneous Q4H  . insulin aspart  4 Units Subcutaneous Q4H  . insulin glargine  10 Units Subcutaneous BID  . mouth rinse  15 mL Mouth Rinse 10 times per day  . midodrine  10 mg Per Tube TID WC  . pantoprazole sodium  40 mg Per Tube Daily  . pneumococcal 23 valent vaccine  0.5 mL Intramuscular Tomorrow-1000  . rosuvastatin  10 mg Per Tube q1800  . sodium chloride flush  10-40 mL Intracatheter Q12H    Infusions: .  prismasol BGK 4/2.5 400 mL/hr at 05/04/19 0056  .  prismasol BGK 4/2.5 200 mL/hr at 05/04/19 0426  . sodium chloride    . ampicillin-sulbactam (UNASYN) IV Stopped (05/04/19 0844)  . epinephrine Stopped (04/28/19 0138)  . feeding supplement (VITAL 1.5 CAL) 1,000 mL (05/04/19 0604)  . feeding supplement (VITAL AF 1.2 CAL) 1,000 mL (05/05/2019 1354)  . fentaNYL infusion INTRAVENOUS Stopped (04/30/19 1211)  . heparin 10,000 units/ 20 mL infusion syringe 1,000 Units/hr (05/04/19 1113)  . lactated ringers Stopped (04/23/2019 1640)  . lactated ringers Stopped (04/30/19 0930)  . milrinone 0.125 mcg/kg/min (05/04/19 1100)  . norepinephrine (LEVOPHED) Adult infusion 17 mcg/min (05/04/19 1100)  . prismasol BGK 4/2.5 1,800 mL/hr at 05/04/19 0910  . sodium phosphate  Dextrose 5% IVPB 43 mL/hr at 05/04/19 1100  . vasopressin (PITRESSIN) infusion - *FOR SHOCK* 0.03 Units/min (05/04/19 1100)    PRN  Medications: Place/Maintain arterial line **AND** sodium chloride, alteplase, fentaNYL, fentaNYL (SUBLIMAZE) injection, fentaNYL (SUBLIMAZE) injection, heparin, levalbuterol, metoprolol tartrate, ondansetron (ZOFRAN) IV, ondansetron (ZOFRAN) IV, sodium chloride, sodium chloride flush     Assessment/Plan    1.Acute systolic HF ->  Cardiogenic Shock  - Due to iCM. EF 20-25% - Impella 5.5 placed on 4/2.   - s/p CABG/MVRepair on 4/6  - Impella out 4/9.  - Echo 4/10 with EF 25-30%, normal RV, no MR.   - Now on CVVH with volume overload/progressive renal failure. CVP 5-6 today. Weight back to baseline. Change CVVHD to even  - Co-ox 64% on norepinephrine 38 -> 19 + vasopressin 0.03 + milrinone 0.125. MAP marginal today. Continue to wean NE and VP as tolerated. Continue midodrine  2. CAD - LHC with severe 3 vessel CAD  - s/p CABG x 3 MV Repair 4/6  - on ASA/Crestor  3. Acute hypoxic respiratory failure - Due to pulmonary edema and PNA - Extubated 4/9 re-intubated on 4/12. Failed re-extubation on 4/14. Now s/p tracheostomy - CVP now down with CVVH. CVP 5-6.  - Continue Unasyn for e. facaelis PNA - Now on TC trials. CCM following. Apprecaite their support   4. Mitral Regurgitation - Mod-severe on ECHO - s/p MV repair, TEE 4/9 with minimal MR s/p repair.   - Echo 4/10 with no significant MR.  5. Uncontrolled DM - Hgb A1C 9.  - On insulin and sliding scale.   6. AKI - due to shock - Became anuric and now on CVVH to control volume overload.   - Remains anuric post-op. Hopefully wil get some recovery Renal following.   7. Acute blood loss Anemia  - HGb 7.7 -> 8.1 after 1u RBCS on 4/17  8. Polymorphic VT - Recurrent VT during respiratory distress 4/14, - Now on po amio - no further VT on tele  - Keep K> 4.0 Mg > 2.0   9. 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 No sensation R and L foot and occasionaly in his hands. Requires assistance with ADLs.   10. Severe  Malnutrition - Prealbumin 8.9  - Nutrition on board - Continue TFs  11. ID - Enterococcus faecalis in sputum => Unasyn - Candida in urine => completed fluconazole course.   12. Elevated LFTs - Suspect shock liver - AST/ALT remain stably elevated.  13. Atrial fibrillation. paroxysmal - Now back in NSR. On po amio per TCTS. Can switch back to IV as needed  14. Debility, severe - will need SNF on d/c  CRITICAL CARE Performed by: Glori Bickers  Total critical care time: 40 minutes  Critical care time was exclusive of separately billable procedures and treating other patients.  Critical care was necessary to treat or prevent imminent or life-threatening deterioration.  Critical care was time spent personally by me on the following activities: development of treatment plan with patient and/or surrogate as well as nursing, discussions with consultants, evaluation of patient's response to treatment, examination of patient, obtaining history from patient or surrogate, ordering and performing treatments and interventions, ordering and review of laboratory studies, ordering and review of radiographic studies, pulse oximetry and re-evaluation of patient's condition.  Quillian Quince Amritha Yorke 05/04/2019 11:18 AM

## 2019-05-04 NOTE — Plan of Care (Signed)
  Problem: Education: Goal: Knowledge of General Education information will improve Description: Including pain rating scale, medication(s)/side effects and non-pharmacologic comfort measures Outcome: Progressing   Problem: Health Behavior/Discharge Planning: Goal: Ability to manage health-related needs will improve Outcome: Progressing   Problem: Clinical Measurements: Goal: Ability to maintain clinical measurements within normal limits will improve Outcome: Progressing Goal: Will remain free from infection Outcome: Progressing Goal: Diagnostic test results will improve Outcome: Progressing Goal: Respiratory complications will improve Outcome: Progressing Goal: Cardiovascular complication will be avoided Outcome: Progressing   Problem: Activity: Goal: Risk for activity intolerance will decrease Outcome: Progressing   Problem: Nutrition: Goal: Adequate nutrition will be maintained Outcome: Progressing   Problem: Coping: Goal: Level of anxiety will decrease Outcome: Progressing   Problem: Elimination: Goal: Will not experience complications related to bowel motility Outcome: Progressing Goal: Will not experience complications related to urinary retention Outcome: Progressing   Problem: Pain Managment: Goal: General experience of comfort will improve Outcome: Progressing   Problem: Safety: Goal: Ability to remain free from injury will improve Outcome: Progressing   Problem: Skin Integrity: Goal: Risk for impaired skin integrity will decrease Outcome: Progressing   Problem: Education: Goal: Will demonstrate proper wound care and an understanding of methods to prevent future damage Outcome: Progressing Goal: Knowledge of disease or condition will improve Outcome: Progressing Goal: Knowledge of the prescribed therapeutic regimen will improve Outcome: Progressing Goal: Individualized Educational Video(s) Outcome: Progressing   Problem: Activity: Goal: Risk for  activity intolerance will decrease Outcome: Progressing   Problem: Cardiac: Goal: Will achieve and/or maintain hemodynamic stability Outcome: Progressing   Problem: Clinical Measurements: Goal: Postoperative complications will be avoided or minimized Outcome: Progressing   Problem: Respiratory: Goal: Respiratory status will improve Outcome: Progressing   Problem: Skin Integrity: Goal: Wound healing without signs and symptoms of infection Outcome: Progressing Goal: Risk for impaired skin integrity will decrease Outcome: Progressing   Problem: Urinary Elimination: Goal: Ability to achieve and maintain adequate renal perfusion and functioning will improve Outcome: Progressing   Problem: Education: Goal: Knowledge about tracheostomy care/management will improve Outcome: Progressing   Problem: Activity: Goal: Ability to tolerate increased activity will improve Outcome: Progressing   Problem: Health Behavior/Discharge Planning: Goal: Ability to manage tracheostomy will improve Outcome: Progressing   Problem: Respiratory: Goal: Patent airway maintenance will improve Outcome: Progressing   Problem: Role Relationship: Goal: Ability to communicate will improve Outcome: Progressing

## 2019-05-04 NOTE — Progress Notes (Signed)
NAME:  Brendan [name], MRN:  [id number], DOB:  06-Jun-1955, LOS: 63 ADMISSION DATE:  03/26/2019, CONSULTATION DATE:  04/17/19 REFERRING MD:  Dr. Haroldine Laws, CHIEF COMPLAINT:  Respiratory distress   Brief History   30 yoM originally presented with SOB and fatigue at [facility] found to have new HFrEF, NSTEMI, and bilateral pleural effusions transferred to [facility] on 3/29 for further cardiac evaluation. 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 Started on lasix and milrinone gtts however developed NSVT. Was taken 4/1 for placement of IABP and swan for optimization prior to CABG. Was on precedex for agitation/ confusion, some concern for DTs. On the evening of 4/1, patient developed worsening respiratory distress and hypoxia, PCCM consulted for intubation and vent management.   He was successfully extubated on 4/4 and PCCM signed off. Impella was removed 4/11 and patient noted to have increased WOB throughout the day he was placed on Bipap, but mental status continued to worsen and PCCM consulted for possible re-intubation.  History of present illness   Brendan [name] is a 64 yo male withpoorly controlled diabetes complicated by neuropathy and hypertension who presented to an outside hospital on 3/26 with a chief complaint of general malaise and shortness of breath for the past 4 days. He is now transferred to [facility] for evaluation of troponin elevation, evaluation of newly depressed EF,and consideration for coronary angiography.  History obtained from patient report as well as discharge paperwork from outside hospital. Briefly, patient reportedly had nausea, vomiting, diarrhea approximately 1 week and is confined to his bed. He finally presented to the outside hospital ED he was found to have a blood sugar of 516, BUN of 53, creatinine 1.2, and anion gap of 17. Patient's BNP was elevated to 8900. Troponin was elevated to 13.1 with changes noted by the team of their lateral leads as well as V1 and V2.  Patient was started on aspirin and a heparin drip. Chest x-ray on admission was concerning for bilateral pulmonary opacities differential including infection and pulmonary edema. So he was started on empiric ceftriaxone and doxycycline, although his procalcitonin was negative. He had a chest wall echo that was performed that showed an EF of 25% was diuresed during his admission with improvement in his shortness of breath as well as radiographically. On discussion with patient he has been chest pain-free since his admission totheoutside hospital. In addition, he feels that his shortness of breath is much improved.the  Patient had left heart cath on 04/06/2019 showing severe three-vessel disease. 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 He then developed worsening cardiogenic shock and was intubated on 4/1 andplaced on a IABP on 4/02. He also had bilateral chest tubes placed with3 L removed from each side. He was then extubated on 4/4. On 4/6 patient had CABG/MVRperformed and was extubated on 4/7 with improved mentation with weaning of sedation and pressors. Patient had Impella removed on 4/9 and subsequent TEE showing an EF of 35%with trivial MR s/p MV repair. Remained on BiPAP at this time.On 4/10 nephrology was consulted due to anuric sinceCABG and started on CRRT.Developing worsening shortness of breath and increased work of breathing on BiPAP with hypoxia on ABG and subsequently reintubated on 4/12.  Patient was extubated on 4/14 and developed worsening hypoxia on BiPAP. Overnight he developed pulseless VT requiring 1 minute of CPR and epinephrine to achieve ROSC. Neurovascularly intact afterward. Subsequently had increased work of breathing and worsening respiratory distress requiring intubation.  Patient had tracheostomy performed on  05/01/2019.  Past Medical History  Tobacco abuse, HTN, poorly controlled diabetes, diabetic  neuropathy  Significant Hospital Events   3/30 Lt heart cath/TEE performed 4/1 Intubated, RHC/IABP/PA cath 4/2 Impella placement 4/3 febrile over evening and night. Blood cultures obtained. Vanc/cefepime started. Hematuria--heparin held. 4/4 Extubated,abx transitioned to rocephin. Heparin restarted 4/6 Re-intubated for CABG/MVR 4/7 Extubated from CABG to BiPAP 4/9 Impella removed  4/9 TEE 4/10 CVC inserted &CRRT initiated  4/12 PCCMre-consulted re-intubation 4/14 Extubated to BiPAP 4/15 Re-intubated due to respiratory distress  Consults:  TCTS PCCM HF  Procedures:  4/15ET tube - 4/15 4/9 Cortrak> 4/6 Rt IJ> 4/10 HD cath- 4/13 4/12 foley cath> 3/30 PICC> 4/13 HD cath> 4/16 Tracheostomy>  Significant Diagnostic Tests:  3/30 R/ LHC >>  Ost LM to Mid LM lesion is 35% stenosed.  Prox LAD lesion is 90% stenosed.  Prox LAD to Mid LAD lesion is 50% stenosed.  Mid Cx lesion is 100% stenosed.  Prox RCA to Mid RCA lesion is 90% stenosed.  RPDA lesion is 95% stenosed.  LV end diastolic pressure is severely elevated.  Hemodynamic findings consistent with moderate pulmonary hypertension. 1. Severe 3 vessel obstructive CAD 2. High LV filling pressures 3. Reduced cardiac output with index 2.3 4. Moderate pulmonary HTN.  3/30TTE >> 1. Left ventricular ejection fraction, by estimation, is 30 to 35%. The left ventricle has moderately decreased function. The left ventricle demonstrates regional wall motion abnormalities.There is mild left ventricular hypertrophy. Left ventricular diastolic function could not be evaluated. There is severe hypokinesis of the left ventricular, entire inferior wall, inferoseptal wall, apical segment and lateral wall.  2. Right ventricular systolic function is hyperdynamic. The right ventricular size is normal. There is moderately elevated pulmonary artery systolic pressure.  3. Decreased posterior leaflet motion due to ischemic tethering of  the mitral valve leaflets.  4. The mitral valve is abnormal. Moderate to severe mitral valve regurgitation.  5. The aortic valve is tricuspid. Aortic valve regurgitation is not visualized. Mild aortic valve sclerosis is present, with no evidence of aortic valve stenosis.  6. The inferior vena cava is normal in size with greater than 50% respiratory variability, suggesting right atrial pressure of 3 mmHg.   4/1 CT chest w/o >>Large bilateral pleural effusions with associated atelectasis.Moderate to severe bilateral ground-glass opacities, likely reflecting pneumonia and/or edema.  4/1 RHC >> RA = 18 RV = 60/20  PA = 63/29 (42) PCW = 33 Fick cardiac output/index = 3.7/1.9 PVR = 2.5 WU FA sat = 98% PA sat = 47%, 49% PaPi = 2.2  4/2 CXR>bilateral chest tubes present. Significantly improved since morning xray. Infiltrate vs atelectasis at right base.  04/12/21CXR>>vascular congestion and extensive bilateral airspace disease, probably reflecting edema  04/29/19 CXR>>1. New left subclavian dialysis catheter without complicating feature. 2. Interval removal of the left-sided chest tube. No pneumothorax. 3. Unchanged pulmonary edema.  05/01/2019 CXR>Radiographically appropriate position of endotracheal tube.  4/16 CXR> Stable support apparatus. Bilateral lung opacities are noted, right greater than left, concerning for worsening pneumonia or edema. No pneumothorax is noted.  Micro Data:  3/30 MRSA PCR >> neg 4/1 MRSA PCR >> neg 4/2 resp culture>>rare gram pos cocci, few candida albicans 4/3 Urine culture>>negative 4/4 blood cultures>>no growth  4/9 Surgical wound>> rare enterococcus faecalis 4/9 BCx2>>no gorwth 4/9 Urine Culture>> greater than 100k yeast  Antimicrobials:  PTA ceftriaxone/ doxycycline >> 3/29 Ceftriaxone 4/4-4/5 Cefepime 4/4>>4/11 vanc 4/4>>4/6 Rocephin 4/4>>4/5 Cefuroxime 4/6-4/7 Ampicillin 4/11 Vancomycin 4/12>>4/13 Cefepime  4/12>>4/13 Augmentin 4/15>  Interim history/subjective:   On vent this morning. Planning for transition to TCT again today   Objective   Blood pressure (!) 107/48, pulse (!) 39, temperature (!) 97 F (36.1 C), resp. rate 20, height _0  (1.803 m), weight 65.8 kg, SpO2 97 %. CVP:  [8 mmHg-12 mmHg] 8 mmHg  Vent Mode: PRVC FiO2 (%):  [35 %-40 %] 35 % Set Rate:  [23 bmp] 23 bmp Vt Set:  [450 mL] 450 mL PEEP:  [5 cmH20-8 cmH20] 8 cmH20 Pressure Support:  [5 cmH20] 5 cmH20 Plateau Pressure:  [24 cmH20] 24 cmH20   Intake/Output Summary (Last 24 hours) at 05/04/2019 3748 Last data filed at 05/04/2019 0908 Gross per 24 hour  Intake 4453.35 ml  Output 5277 ml  Net -823.65 ml   Filed Weights   05/03/19 0300 05/04/19 0000 05/04/19 0256  Weight: 68.5 kg 65.8 kg 65.8 kg    Examination: General appearance: 64 y.o., male, trach in place, on vent  Eyes: alert, tracking  HENT: tracheostomy in place  Lungs: BL vented breaths  CV: RRR, s1 s2, pacer wires still in place  Abdomen: soft, nt nd  Extremities: no edema  Neuro: alert, oriented, following commands     Resolved Hospital Problem list     Assessment & Plan:   Acute hypoxic respiratory failure, multifactorial - Tracheostomy yesterday 2/2 to failed SBT with hypoxemia, pulseless VT arrest, CXR pending  - Titrate Trach collar to maintain SPO2 greater than or equal to 90%,HOB elevated to 30 degrees - BAL grew rare enterococcus, on 7 day course of Augmentin.  - On day 4 of Augmentin, continue for 7 days for aspiration pneumonia. - again trial off vent today  - transition to TCT and likely back on vent tonight  - pulmonary will follow for vent management and trach care   HFrEF (20-25%), cardiogenic shock, NSTEMI with three-vessel disease, NSVT Polymorphic VT - amiodarone and milrinone per surgery service   Severe Malnutrition - continue TF   AKI possibly ATN in the setting of cardiogenic shock, anuric  Cr stable at 1.56 -  cvvhd per nephrology   Diabetes mellitus - lantus + SSI  - goal 140-180 CBGs   Elevated transaminase enzymes with cholestasis pattern - RUQ US shows previous cholecystectomy, without bile duct dilation. - related to above, will observe   Best practice:  Diet: Tube feeds Pain/Anxiety/Delirium protocol (if indicated):Fentanyl VAP protocol (if indicated):HOB 30 degrees,  DVT prophylaxis: heparin  GI prophylaxis: PPI Glucose control:SSI Mobility: BR Code Status: Full  Family Communication:per primary team  Disposition:ICU  Labs   CBC: Recent Labs  Lab 05/01/19 0449 05/01/19 1731 04/21/2019 0334 04/27/2019 0334 05/08/2019 1526 05/01/2019 1526 05/14/2019 1535 05/03/19 0317 05/03/19 1356 05/04/19 0303 05/04/19 0422  WBC 16.6*  --  16.6*  --  21.1*  --   --  20.0*  --  20.8*  --   HGB 9.3*   < > 7.9*   < > 8.4*   < > 9.5* 7.7* 8.1* 8.0* 9.2*  HCT 28.3*   < > 24.2*   < > 25.3*   < > 28.0* 24.0* 24.9* 24.8* 27.0*  MCV 87.1  --  87.1  --  86.6  --   --  86.3  --  88.3  --   PLT 212  --  187  --  227  --   --  258  --  266  --    < > = values in this interval not displayed.  Basic Metabolic Panel: Recent Labs  Lab 04/30/19 0436 04/30/19 1116 05/01/19 0449 05/01/19 1547 05/01/2019 0334 04/29/2019 0334 04/26/2019 1526 04/19/2019 1526 05/11/2019 1535 05/03/19 0317 05/03/19 1650 05/04/19 0303 05/04/19 0422  NA 136   < > 136   < > 135   < > 133*   < > 134* 135 134* 136  134* 136  K 4.9   < > 4.8   < > 4.0   < > 4.9   < > 4.6 4.4 4.6 4.6  4.4 4.7  CL 102   < > 102   < > 101  --  101  --   --  102 101 102  100  --   CO2 23   < > 24   < > 23  --  23  --   --  _0 --   GLUCOSE 134*   < > 135*   < > 188*  --  240*  --   --  147* 241* 179*  198*  --   BUN 28*   < > 29*   < > 26*  --  29*  --   --  29* 26* 22  21  --   CREATININE 1.54*   < > 1.58*   < > 1.28*  --  1.49*  --   --  1.54* 1.27* 1.24  1.21  --   CALCIUM 7.3*   < > 7.3*   < > 7.1*  --  7.0*  --   --  7.1*  6.9* 7.2*  7.0*  --   MG 2.5*  --  2.6*  --  2.7*  --   --   --   --  2.4  --  2.4  --   PHOS 1.5*   < > 2.1*   < > 1.3*  --  4.4  --   --  2.2* 2.9 2.0*  --    < > = values in this interval not displayed.   GFR: Estimated Creatinine Clearance: 59.7 mL/min (by C-G formula based on SCr of 1.21 mg/dL). Recent Labs  Lab 05/05/2019 0334 05/10/2019 1526 05/03/19 0317 05/04/19 0303  WBC 16.6* 21.1* 20.0* 20.8*    Liver Function Tests: Recent Labs  Lab 04/29/19 0444 04/29/19 1644 04/30/19 0436 04/30/19 1600 05/01/19 0449 05/01/19 1547 05/11/2019 0334 04/27/2019 1526 05/03/19 0317 05/03/19 1650 05/04/19 0303  AST 167*  --  180*  --  301*  --   --   --  361*  --  286*  ALT 242*  --  198*  --  231*  --   --   --  222*  --  239*  ALKPHOS 499*  --  488*  --  501*  --   --   --  558*  --  529*  BILITOT 1.0  --  1.0  --  0.8  --   --   --  0.8  --  1.2  PROT 5.3*  --  5.2*  --  5.1*  --   --   --  5.3*  --  5.4*  ALBUMIN 1.5*  1.5*   < > 1.3*  1.4*   < > 1.4*  1.4*   < > 1.4* 1.4* 1.7* 2.0* 2.1*  2.0*   < > = values in this interval not displayed.   No results for input(s): LIPASE, AMYLASE in the last 168 hours. No results for input(s): AMMONIA  in the last 168 hours.  ABG    Component Value Date/Time   PHART 7.492 (H) 05/04/2019 0422   PCO2ART 34.2 05/04/2019 0422   PO2ART 82.0 (L) 05/04/2019 0422   HCO3 26.3 05/04/2019 0422   TCO2 27 05/04/2019 0422   ACIDBASEDEF 3.0 (H) 04/28/2019 0735   O2SAT 97.0 05/04/2019 0422     Coagulation Profile: Recent Labs  Lab 05/01/19 0449  INR 1.6*    Cardiac Enzymes: No results for input(s): CKTOTAL, CKMB, CKMBINDEX, TROPONINI in the last 168 hours.  HbA1C: HbA1c, POC (controlled diabetic range)  Date/Time Value Ref Range Status  01/14/2019 02:02 PM 12.5 (A) 0.0 - 7.0 % Final  08/21/2017 02:03 PM 8.5 (A) 0.0 - 7.0 % Final   Hgb A1c MFr Bld  Date/Time Value Ref Range Status  03/17/2019 09:49 PM 9.0 (H) 4.8 - 5.6 % Final     Comment:    (NOTE) Pre diabetes:          5.7%-6.4% Diabetes:              >6.4% Glycemic control for   <7.0% adults with diabetes     CBG: Recent Labs  Lab 05/03/19 1540 05/03/19 2007 05/03/19 2341 05/04/19 0420 05/04/19 0811  GLUCAP 178* 201* 158* 170* 133*     Garner Nash, DO [facility] 05/04/2019 9:39 AM

## 2019-05-04 NOTE — Progress Notes (Signed)
2 Days Post-Op Procedure(s) (LRB): VIDEO BRONCHOSCOPY USING DISPOSABLE ANESTHESIA SCOPE (N/A) TRACHEOSTOMY (N/A) Subjective: Patient slowly progressing but remains weak because of poor preoperative condition with heart failure malnutrition and uncontrolled diabetes  Tolerating trach collar during the day is resting at night on pressure regulated volume control mode  Maintaining sinus rhythm on low-dose amiodarone Nutrition per core track-we will ask for speech therapy assessment, PM valve, swallow study, swallow rehab strategy  Objective: Vital signs in last 24 hours: Temp:  [96.1 F (35.6 C)-98.8 F (37.1 C)] 96.1 F (35.6 C) (04/18 1130) Pulse Rate:  [28-127] 34 (04/18 1130) Cardiac Rhythm: Normal sinus rhythm;Atrial fibrillation (04/18 1126) Resp:  [12-38] 15 (04/18 1130) BP: (78-119)/(34-73) 106/49 (04/18 1130) SpO2:  [87 %-100 %] 98 % (04/18 1130) FiO2 (%):  [35 %-40 %] 35 % (04/18 1130) Weight:  [65.8 kg] 65.8 kg (04/18 0256)  Hemodynamic parameters for last 24 hours: CVP:  [8 mmHg-12 mmHg] 8 mmHg  Intake/Output from previous day: 04/17 0701 - 04/18 0700 In: 4491.5 [I.V.:1471.9; Blood:315; FK/CL:2751; IV Piggyback:1059.6] Out: 5270  Intake/Output this shift: Total I/O In: 852.9 [I.V.:198; NG/GT:420; IV Piggyback:234.9] Out: 931 [Other:931]  Exam Scattered rhonchi Sternal incision healing well Abdomen soft Extremities warm No focal neuro deficit  Lab Results: Recent Labs    05/03/19 0317 05/03/19 1356 05/04/19 0303 05/04/19 0422  WBC 20.0*  --  20.8*  --   HGB 7.7*   < > 8.0* 9.2*  HCT 24.0*   < > 24.8* 27.0*  PLT 258  --  266  --    < > = values in this interval not displayed.   BMET:  Recent Labs    05/03/19 1650 05/03/19 1650 05/04/19 0303 05/04/19 0422  NA 134*   < > 136  134* 136  K 4.6   < > 4.6  4.4 4.7  CL 101  --  102  100  --   CO2 23  --  24  24  --   GLUCOSE 241*  --  179*  198*  --   BUN 26*  --  22  21  --   CREATININE  1.27*  --  1.24  1.21  --   CALCIUM 6.9*  --  7.2*  7.0*  --    < > = values in this interval not displayed.    PT/INR: No results for input(s): LABPROT, INR in the last 72 hours. ABG    Component Value Date/Time   PHART 7.492 (H) 05/04/2019 0422   HCO3 26.3 05/04/2019 0422   TCO2 27 05/04/2019 0422   ACIDBASEDEF 3.0 (H) 04/28/2019 0735   O2SAT 97.0 05/04/2019 0422   CBG (last 3)  Recent Labs    05/04/19 0420 05/04/19 0811 05/04/19 1109  GLUCAP 170* 133* 252*    Assessment/Plan: S/P Procedure(s) (LRB): VIDEO BRONCHOSCOPY USING DISPOSABLE ANESTHESIA SCOPE (N/A) TRACHEOSTOMY (N/A) Speech therapy consult Continue trach collar trials Wean norepinephrine as tolerated to maintain systolic blood pressure greater than 100,    LOS: 20 days    [name] 05/04/2019

## 2019-05-05 ENCOUNTER — Encounter: Payer: Self-pay | Admitting: *Deleted

## 2019-05-05 LAB — BLOOD GAS, ARTERIAL
Acid-Base Excess: 4 mmol/L — ABNORMAL HIGH (ref 0.0–2.0)
Bicarbonate: 28.6 mmol/L — ABNORMAL HIGH (ref 20.0–28.0)
O2 Saturation: 58.7 %
Patient temperature: 37
pCO2 arterial: 48.1 mmHg — ABNORMAL HIGH (ref 32.0–48.0)
pH, Arterial: 7.392 (ref 7.350–7.450)
pO2, Arterial: 31.9 mmHg — CL (ref 83.0–108.0)

## 2019-05-05 LAB — GLUCOSE, CAPILLARY
Glucose-Capillary: 135 mg/dL — ABNORMAL HIGH (ref 70–99)
Glucose-Capillary: 118 mg/dL — ABNORMAL HIGH (ref 70–99)
Glucose-Capillary: 137 mg/dL — ABNORMAL HIGH (ref 70–99)
Glucose-Capillary: 166 mg/dL — ABNORMAL HIGH (ref 70–99)
Glucose-Capillary: 121 mg/dL — ABNORMAL HIGH (ref 70–99)

## 2019-05-05 LAB — RENAL FUNCTION PANEL
Albumin: 1.7 g/dL — ABNORMAL LOW (ref 3.5–5.0)
Albumin: 1.8 g/dL — ABNORMAL LOW (ref 3.5–5.0)
Anion gap: 9 (ref 5–15)
Anion gap: 9 (ref 5–15)
BUN: 21 mg/dL (ref 8–23)
BUN: 22 mg/dL (ref 8–23)
CO2: 26 mmol/L (ref 22–32)
CO2: 27 mmol/L (ref 22–32)
Calcium: 7.4 mg/dL — ABNORMAL LOW (ref 8.9–10.3)
Calcium: 7.4 mg/dL — ABNORMAL LOW (ref 8.9–10.3)
Chloride: 103 mmol/L (ref 98–111)
Chloride: 102 mmol/L (ref 98–111)
Creatinine, Ser: 1.17 mg/dL (ref 0.61–1.24)
Creatinine, Ser: 1.16 mg/dL (ref 0.61–1.24)
GFR calc Af Amer: >60 mL/min (ref >60)
GFR calc Af Amer: >60 mL/min (ref >60)
GFR calc non Af Amer: >60 mL/min (ref >60)
GFR calc non Af Amer: >60 mL/min (ref >60)
Glucose, Bld: 133 mg/dL — ABNORMAL HIGH (ref 70–99)
Glucose, Bld: 180 mg/dL — ABNORMAL HIGH (ref 70–99)
Phosphorus: 2.1 mg/dL — ABNORMAL LOW (ref 2.5–4.6)
Phosphorus: 3.8 mg/dL (ref 2.5–4.6)
Potassium: 4.4 mmol/L (ref 3.5–5.1)
Potassium: 4.6 mmol/L (ref 3.5–5.1)
Sodium: 138 mmol/L (ref 135–145)
Sodium: 138 mmol/L (ref 135–145)

## 2019-05-05 LAB — POCT I-STAT 7, (LYTES, BLD GAS, ICA,H+H)
Acid-Base Excess: 4 mmol/L — ABNORMAL HIGH (ref 0.0–2.0)
Bicarbonate: 27.9 mmol/L (ref 20.0–28.0)
Calcium, Ion: 1.13 mmol/L — ABNORMAL LOW (ref 1.15–1.40)
HCT: 26 % — ABNORMAL LOW (ref 39.0–52.0)
Hemoglobin: 8.8 g/dL — ABNORMAL LOW (ref 13.0–17.0)
O2 Saturation: 92 %
Patient temperature: 36.6
Potassium: 4.4 mmol/L (ref 3.5–5.1)
Sodium: 139 mmol/L (ref 135–145)
TCO2: 29 mmol/L (ref 22–32)
pCO2 arterial: 38.2 mmHg (ref 32.0–48.0)
pH, Arterial: 7.47 — ABNORMAL HIGH (ref 7.350–7.450)
pO2, Arterial: 59 mmHg — ABNORMAL LOW (ref 83.0–108.0)

## 2019-05-05 LAB — CBC
HCT: 24.2 % — ABNORMAL LOW (ref 39.0–52.0)
Hemoglobin: 7.7 g/dL — ABNORMAL LOW (ref 13.0–17.0)
MCH: 28.3 pg (ref 26.0–34.0)
MCHC: 31.8 g/dL (ref 30.0–36.0)
MCV: 89 fL (ref 80.0–100.0)
Platelets: 296 10*3/uL (ref 150–400)
RBC: 2.72 MIL/uL — ABNORMAL LOW (ref 4.22–5.81)
RDW: 17.8 % — ABNORMAL HIGH (ref 11.5–15.5)
WBC: 17.8 10*3/uL — ABNORMAL HIGH (ref 4.0–10.5)
nRBC: 0.2 % (ref 0.0–0.2)

## 2019-05-05 LAB — COMPREHENSIVE METABOLIC PANEL
ALT: 255 U/L — ABNORMAL HIGH (ref 0–44)
AST: 327 U/L — ABNORMAL HIGH (ref 15–41)
Albumin: 1.8 g/dL — ABNORMAL LOW (ref 3.5–5.0)
Alkaline Phosphatase: 596 U/L — ABNORMAL HIGH (ref 38–126)
Anion gap: 8 (ref 5–15)
BUN: 21 mg/dL (ref 8–23)
CO2: 25 mmol/L (ref 22–32)
Calcium: 7.3 mg/dL — ABNORMAL LOW (ref 8.9–10.3)
Chloride: 104 mmol/L (ref 98–111)
Creatinine, Ser: 1.16 mg/dL (ref 0.61–1.24)
GFR calc Af Amer: >60 mL/min (ref >60)
GFR calc non Af Amer: >60 mL/min (ref >60)
Glucose, Bld: 139 mg/dL — ABNORMAL HIGH (ref 70–99)
Potassium: 4.5 mmol/L (ref 3.5–5.1)
Sodium: 137 mmol/L (ref 135–145)
Total Bilirubin: 0.8 mg/dL (ref 0.3–1.2)
Total Protein: 5.3 g/dL — ABNORMAL LOW (ref 6.5–8.1)

## 2019-05-05 LAB — COOXEMETRY PANEL
Carboxyhemoglobin: 1 % (ref 0.5–1.5)
Methemoglobin: 0.5 % (ref 0.0–1.5)
O2 Saturation: 57.4 %
Total hemoglobin: 8.7 g/dL — ABNORMAL LOW (ref 12.0–16.0)

## 2019-05-05 LAB — FUNGUS STAIN

## 2019-05-05 LAB — PREPARE RBC (CROSSMATCH)

## 2019-05-05 LAB — CULTURE, RESPIRATORY W GRAM STAIN: Culture: NO GROWTH

## 2019-05-05 LAB — MAGNESIUM: Magnesium: 2.5 mg/dL — ABNORMAL HIGH (ref 1.7–2.4)

## 2019-05-05 MED ORDER — PRO-STAT SUGAR FREE PO LIQD
60.0000 mL | Freq: Two times a day (BID) | ORAL | Status: DC
  Administered 2019-05-05 – 2019-05-08 (×6): 60 mL
  Filled 2019-05-05 (×6): qty 60

## 2019-05-05 MED ORDER — MIDODRINE HCL 5 MG PO TABS
15.0000 mg | ORAL_TABLET | Freq: Three times a day (TID) | ORAL | Status: DC
  Administered 2019-05-05 – 2019-05-13 (×24): 15 mg
  Filled 2019-05-05 (×24): qty 3

## 2019-05-05 MED ORDER — VITAL 1.5 CAL PO LIQD
1000.0000 mL | ORAL | Status: DC
  Administered 2019-05-05 – 2019-05-12 (×8): 1000 mL
  Filled 2019-05-05 (×10): qty 1000

## 2019-05-05 MED ORDER — SODIUM PHOSPHATES 45 MMOLE/15ML IV SOLN
30.0000 mmol | Freq: Once | INTRAVENOUS | Status: AC
  Administered 2019-05-05: 30 mmol via INTRAVENOUS
  Filled 2019-05-05: qty 10

## 2019-05-05 NOTE — Progress Notes (Addendum)
TCTS DAILY ICU PROGRESS NOTE                   [address]            [place] [address]          [phone]   3 Days Post-Op Procedure(s) (LRB): VIDEO BRONCHOSCOPY USING DISPOSABLE ANESTHESIA SCOPE (N/A) TRACHEOSTOMY (N/A)  Total Length of Stay:  LOS: 21 days   Subjective: On CVVHD and trach collar this am  Objective: Vital signs in last 24 hours: Temp:  [95.9 F (35.5 C)-97.9 F (36.6 C)] 97.7 F (36.5 C) (04/19 0730) Pulse Rate:  [25-117] 99 (04/19 0730) Cardiac Rhythm: Atrial fibrillation (04/19 0400) Resp:  [10-38] 14 (04/19 0730) BP: (83-112)/(34-91) 92/65 (04/19 0730) SpO2:  [93 %-100 %] 100 % (04/19 0730) FiO2 (%):  [35 %-40 %] 40 % (04/19 0400) Weight:  [61.9 kg] 61.9 kg (04/19 0143)  Filed Weights   05/04/19 0000 05/04/19 0256 05/05/19 0143  Weight: 65.8 kg 65.8 kg 61.9 kg    Weight change: -3.9 kg   Hemodynamic parameters for last 24 hours: CVP:  [6 mmHg-10 mmHg] 10 mmHg  Intake/Output from previous day: 04/18 0701 - 04/19 0700 In: 2756.5 [I.V.:785.9; NG/GT:1470; IV Piggyback:500.6] Out: 0865   Intake/Output this shift: Total I/O In: 40 [NG/GT:40] Out: -   Current Meds: Scheduled Meds: . amiodarone  200 mg Oral Daily  . aspirin EC  325 mg Oral Daily   Or  . aspirin  324 mg Per Tube Daily  . B-complex with vitamin C  1 tablet Per Tube Daily  . bisacodyl  10 mg Oral Daily   Or  . bisacodyl  10 mg Rectal Daily  . chlorhexidine gluconate (MEDLINE KIT)  15 mL Mouth Rinse BID  . Chlorhexidine Gluconate Cloth  6 each Topical Daily  . docusate  200 mg Per Tube Daily  . feeding supplement (PRO-STAT SUGAR FREE 64)  30 mL Per Tube BID  . gabapentin  300 mg Per Tube Q8H  . insulin aspart  0-24 Units Subcutaneous Q4H  . insulin aspart  4 Units Subcutaneous Q4H  . insulin glargine  10 Units Subcutaneous BID  . mouth rinse  15 mL Mouth Rinse 10 times per day  . midodrine  15 mg Per Tube TID WC  . pantoprazole sodium  40 mg Per Tube  Daily  . pneumococcal 23 valent vaccine  0.5 mL Intramuscular Tomorrow-1000  . rosuvastatin  10 mg Per Tube q1800  . sodium chloride flush  10-40 mL Intracatheter Q12H   Continuous Infusions: .  prismasol BGK 4/2.5 400 mL/hr at 05/05/19 0301  .  prismasol BGK 4/2.5 200 mL/hr at 05/05/19 0620  . sodium chloride    . ampicillin-sulbactam (UNASYN) IV 3 g (05/05/19 0724)  . epinephrine Stopped (04/28/19 0138)  . feeding supplement (VITAL 1.5 CAL) 50 mL/hr at 05/05/19 0500  . feeding supplement (VITAL AF 1.2 CAL) 1,000 mL (04/21/2019 1354)  . fentaNYL infusion INTRAVENOUS Stopped (04/30/19 1211)  . heparin 10,000 units/ 20 mL infusion syringe 1,000 Units/hr (05/05/19 0616)  . lactated ringers Stopped (04/27/2019 1640)  . lactated ringers Stopped (04/30/19 0930)  . milrinone 0.125 mcg/kg/min (05/05/19 0700)  . norepinephrine (LEVOPHED) Adult infusion 17 mcg/min (05/05/19 0703)  . prismasol BGK 4/2.5 1,800 mL/hr at 05/05/19 0711  . vasopressin (PITRESSIN) infusion - *FOR SHOCK* 0.03 Units/min (05/05/19 0712)   PRN Meds:.Place/Maintain arterial line **AND** sodium chloride, alteplase, fentaNYL, fentaNYL (SUBLIMAZE) injection, heparin,  levalbuterol, metoprolol tartrate, ondansetron (ZOFRAN) IV, ondansetron (ZOFRAN) IV, sodium chloride, sodium chloride flush  Heart: RRR Lungs: Slightly diminished basilar breath sounds but mostly clear Abdomen: Soft, non tender, rare bowel sounds this am Extremities: [employer], SCDs in place.  Wound: Sternal and right axillary wounds are clean and dry.   Lab Results: CBC: Recent Labs    05/04/19 0303 05/04/19 0422 05/05/19 0332 05/05/19 0441  WBC 20.8*  --  17.8*  --   HGB 8.0*   < > 7.7* 8.8*  HCT 24.8*   < > 24.2* 26.0*  PLT 266  --  296  --    < > = values in this interval not displayed.   BMET:  Recent Labs    05/04/19 1530 05/04/19 1530 05/05/19 0332 05/05/19 0441  NA 138   < > 137  138 139  K 4.4   < > 4.5  4.4 4.4  CL 102  --  104   103  --   CO2 25  --  25  26  --   GLUCOSE 123*  --  139*  133*  --   BUN 20  --  21  21  --   CREATININE 1.13  --  1.16  1.17  --   CALCIUM 7.4*  --  7.3*  7.4*  --    < > = values in this interval not displayed.    CMET: Lab Results  Component Value Date   WBC 17.8 (H) 05/05/2019   HGB 8.8 (L) 05/05/2019   HCT 26.0 (L) 05/05/2019   PLT 296 05/05/2019   GLUCOSE 133 (H) 05/05/2019   GLUCOSE 139 (H) 05/05/2019   CHOL 174 08/21/2017   TRIG 143 08/21/2017   HDL 57 08/21/2017   LDLCALC 88 08/21/2017   ALT 255 (H) 05/05/2019   AST 327 (H) 05/05/2019   NA 139 05/05/2019   K 4.4 05/05/2019   CL 103 05/05/2019   CL 104 05/05/2019   CREATININE 1.17 05/05/2019   CREATININE 1.16 05/05/2019   BUN 21 05/05/2019   BUN 21 05/05/2019   CO2 26 05/05/2019   CO2 25 05/05/2019   TSH 0.925 04/13/2019   INR 1.6 (H) 05/01/2019   HGBA1C 9.0 (H) 04/01/2019   MICROALBUR 30 08/21/2017    PT/INR:  No results for input(s): LABPROT, INR in the last 72 hours. Radiology: No results found.  Assessment/Plan: S/P Procedure(s) (LRB): VIDEO BRONCHOSCOPY USING DISPOSABLE ANESTHESIA SCOPE (N/A) TRACHEOSTOMY (N/A)  1. CV-S/p removal of Impella on 04/09.  S/p V tach arrest 04/15. Previous a fib. SR/PACs this am.  On Amiodarone 200 mg daily and Midodrine 15 mg tid. Also, on Milrinone 0.125 mcg/kg/min, Nor epinephrine 17 mcg/min, and Vasopressin .03 units/min drips. Weaning drips as able.  Co ox this am decreased to 57.4 2. Pulmonary-S/p trach 04/16. On trach collar during the day and ventilator at night. ABG results this am noted 3. Expected post op blood loss anemia-H and H this am decreased to 8.8 and 26 4. DM-CBGs 174/135/118. On Insulin. He was on Insulin and Metformin 1000 mg bid prior to surgery. Continue on Insulin for now as creatinine elevated and NPO. Pre op HGA1C 9. He will need close medical follow up after discharge 5. AKI-Creatinine decreased to 1.16 this am. Nephrology following and  arranging for CVVHD accordingly 6. Acute systolic heart failure-Previously on Lasix drip. CVP 10 this am 7. GI-severe malnutrition of chronic illness.  Cortrak, TFs. Await speech pathology evaluation today 10. Elevated  transaminases likely related to shock-AST 327, ALT 255, ALk phos 596. Will stop Crestor for now 11. ID-On Unasyn forEnterococcus Faecalis in sputum . Of note he completed Fluconazole for Candida in urine. Completed Vanco and Cefepime for Enterococcus Faecalis. 12. Extremely deconditioned-will need PT at some point, but with still being on drips, CVVHD sitting upright at times for now.  [name] PA-C 05/05/2019 7:58 AM   Cont trach collar during day CVVH - wt at preop - transition to HD when norepi is weaned down Transfuse for Hb < 8 with drop in coox CBGs controlled  10 days Unasyn for eneterococcus in bronchial washhing patient examined and medical record reviewed,agree with above note. [name] 05/05/2019

## 2019-05-05 NOTE — Procedures (Signed)
Admit: 04/03/2019 LOS: 21  30M dialysis dependent AKI; cardiogenic shock status post CABG + MV replacement 4/2  Current CRRT Prescription: Start Date: 04/26/19 Catheter: L [place] Temp HD Cath BFR: 200 Pre Blood Pump: 400 4K DFR: 1800 4K Replacement Rate: 200 4K Goal UF: net even Anticoagulation: 1027m/h hep fixed Clotting: infrequent ~1x/24h   S:  K 4.4, P 2.1  0.7L neg yesterday  NE, milrinone, VP gtt  O: 04/18 0701 - 04/19 0700 In: 2756.5 [I.V.:785.9; NG/GT:1470; IV Piggyback:500.6] Out: 3470   Filed Weights   05/04/19 0000 05/04/19 0256 05/05/19 0143  Weight: 65.8 kg 65.8 kg 61.9 kg    Recent Labs  Lab 05/04/19 0303 05/04/19 0422 05/04/19 1530 05/05/19 0332 05/05/19 0441  NA 136  134*   < > 138 137  138 139  K 4.6  4.4   < > 4.4 4.5  4.4 4.4  CL 102  100  --  102 104  103  --   CO2 24  24  --  _0 --   GLUCOSE 179*  198*  --  123* 139*  133*  --   BUN 22  21  --  _1 --   CREATININE 1.24  1.21  --  1.13 1.16  1.17  --   CALCIUM 7.2*  7.0*  --  7.4* 7.3*  7.4*  --   PHOS 2.0*  --  4.1 2.1*  --    < > = values in this interval not displayed.   Recent Labs  Lab 05/03/19 0317 05/03/19 1356 05/04/19 0303 05/04/19 0303 05/04/19 0422 05/05/19 0332 05/05/19 0441  WBC 20.0*  --  20.8*  --   --  17.8*  --   HGB 7.7*   < > 8.0*   < > 9.2* 7.7* 8.8*  HCT 24.0*   < > 24.8*   < > 27.0* 24.2* 26.0*  MCV 86.3  --  88.3  --   --  89.0  --   PLT 258  --  266  --   --  296  --    < > = values in this interval not displayed.    Scheduled Meds: . amiodarone  200 mg Oral Daily  . aspirin EC  325 mg Oral Daily   Or  . aspirin  324 mg Per Tube Daily  . B-complex with vitamin C  1 tablet Per Tube Daily  . bisacodyl  10 mg Oral Daily   Or  . bisacodyl  10 mg Rectal Daily  . chlorhexidine gluconate (MEDLINE KIT)  15 mL Mouth Rinse BID  . Chlorhexidine Gluconate Cloth  6 each Topical Daily  . docusate  200 mg Per Tube Daily  . feeding  supplement (PRO-STAT SUGAR FREE 64)  30 mL Per Tube BID  . gabapentin  300 mg Per Tube Q8H  . insulin aspart  0-24 Units Subcutaneous Q4H  . insulin aspart  4 Units Subcutaneous Q4H  . insulin glargine  10 Units Subcutaneous BID  . mouth rinse  15 mL Mouth Rinse 10 times per day  . midodrine  15 mg Per Tube TID WC  . pantoprazole sodium  40 mg Per Tube Daily  . pneumococcal 23 valent vaccine  0.5 mL Intramuscular Tomorrow-1000  . rosuvastatin  10 mg Per Tube q1800  . sodium chloride flush  10-40 mL Intracatheter Q12H   Continuous Infusions: .  prismasol BGK 4/2.5 400 mL/hr at 05/05/19 0301  .  prismasol BGK 4/2.5 200 mL/hr at 05/05/19 0620  . sodium chloride    . ampicillin-sulbactam (UNASYN) IV Stopped (05/05/19 0756)  . epinephrine Stopped (04/28/19 0138)  . feeding supplement (VITAL 1.5 CAL) 50 mL/hr at 05/05/19 0500  . feeding supplement (VITAL AF 1.2 CAL) 1,000 mL (05/16/2019 1354)  . fentaNYL infusion INTRAVENOUS Stopped (04/30/19 1211)  . heparin 10,000 units/ 20 mL infusion syringe 1,000 Units/hr (05/05/19 0616)  . lactated ringers Stopped (05/14/2019 1640)  . lactated ringers Stopped (04/30/19 0930)  . milrinone 0.125 mcg/kg/min (05/05/19 0800)  . norepinephrine (LEVOPHED) Adult infusion 17 mcg/min (05/05/19 0800)  . prismasol BGK 4/2.5 1,800 mL/hr at 05/05/19 0711  . vasopressin (PITRESSIN) infusion - *FOR SHOCK* 0.03 Units/min (05/05/19 0800)   PRN Meds:.Place/Maintain arterial line **AND** sodium chloride, alteplase, fentaNYL, fentaNYL (SUBLIMAZE) injection, heparin, levalbuterol, metoprolol tartrate, ondansetron (ZOFRAN) IV, ondansetron (ZOFRAN) IV, sodium chloride, sodium chloride flush  ABG    Component Value Date/Time   PHART 7.470 (H) 05/05/2019 0441   PCO2ART 38.2 05/05/2019 0441   PO2ART 59.0 (L) 05/05/2019 0441   HCO3 27.9 05/05/2019 0441   TCO2 29 05/05/2019 0441   ACIDBASEDEF 3.0 (H) 04/28/2019 0735   O2SAT 92.0 05/05/2019 0441    A/P  1. Dialysis  dependent AKI, anuric 2. Hypophosphatemia, from CRRT, req intermittent repletion 3. Enterococcus faecalis pneumonia; per primary / CCM 4. 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 Cardiogenic shock on inotrope 6. CAD status post CABG 7. Mitral regurgitation status post mitral valve repair 8. diabetes mellitus 2 9. Acute blood loss anemia; Hb stable 8-9s 10. Recurrent polymorphic ventricular tachycardia, on amiodarone  Cont all 4K baths.  Cont fixed dose hep in CRRT circuilt. Replete Phos this AM.  Follow CRRT labs.   Pearson Grippe, MD [facility]

## 2019-05-05 NOTE — Evaluation (Addendum)
[facility] Speaking Valve - Evaluation Patient Details  Name: Brendan [name] MRN: [id number] Date of Birth: 1955-07-01  Today's Date: 05/05/2019 Time: 1135-1205 SLP Time Calculation (min) (ACUTE ONLY): 30 min  Past Medical History:  Past Medical History:  Diagnosis Date  . Cholecystitis 03/2013  . Complication of anesthesia   . Diabetes mellitus without complication ([place])   . Erectile dysfunction 2010  . HTN (hypertension)   . NSTEMI (non-ST elevated myocardial infarction) ([place]) 04/09/2019  . PONV (postoperative nausea and vomiting)    Past Surgical History:  Past Surgical History:  Procedure Laterality Date  . APPENDECTOMY  1969  . BRONCHIAL WASHINGS N/A 05/13/2019   Procedure: Bronchial Washings;  Surgeon: Ivin Poot, MD;  Location: [place];  Service: Open Heart Surgery;  Laterality: N/A;  . CHOLECYSTECTOMY    . CHOLECYSTECTOMY N/A 04/02/2013   Procedure: LAPAROSCOPIC CHOLECYSTECTOMY WITH  INTRAOPERATIVE CHOLANGIOGRAM;  Surgeon: Joyice Faster. Cornett, MD;  Location: [place];  Service: General;  Laterality: N/A;  . CORONARY ARTERY BYPASS GRAFT N/A 05/05/2019   Procedure: CORONARY ARTERY BYPASS GRAFTING (CABG) x3, USING LEFT INTERNAL MAMMARY ARTERY AND LEFT LEG GREATER SAPHENOUS VEIN HARVESTED ENDOSCOPICALLY;  Surgeon: Ivin Poot, MD;  Location: [place];  Service: Open Heart Surgery;  Laterality: N/A;  Inhaled Nitric-Oxide  . ERCP N/A 04/02/2013   Procedure: ENDOSCOPIC RETROGRADE CHOLANGIOPANCREATOGRAPHY (ERCP);  Surgeon: Inda Castle, MD;  Location: [place];  Service: Endoscopy;  Laterality: N/A;  . 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   . IABP INSERTION N/A 04/20/2019   Procedure: IABP INSERTION;  Surgeon: Jolaine Artist, MD;  Location: [place] [facility];  Service: Cardiovascular;  Laterality: N/A;  . MITRAL VALVE REPLACEMENT N/A 05/09/2019   Procedure: MITRAL VALVE (MV) REPAIR USING PHYSIO II RING SIZE 26MM;  Surgeon: Prescott Gum, Collier Salina, MD;  Location: [place];  Service: Open Heart Surgery;  Laterality: N/A;  . PLACEMENT OF IMPELLA LEFT VENTRICULAR ASSIST DEVICE Right 05/12/2019   Procedure: Placement of Impella 5.5 Direct;  Surgeon: Ivin Poot, MD;  Location: [place];  Service: Open Heart Surgery;  Laterality: Right;  . REMOVAL OF IMPELLA LEFT VENTRICULAR ASSIST DEVICE N/A 05/14/2019   Procedure: REMOVAL OF IMPELLA 5.5 direct, LEFT VENTRICULAR ASSIST [place], TEE;  Surgeon: Ivin Poot, MD;  Location: [place];  Service: Open Heart Surgery;  Laterality: N/A;  . RIGHT HEART CATH N/A 04/26/2019   Procedure: RIGHT HEART CATH;  Surgeon: Jolaine Artist, MD;  Location: [place] [facility];  Service: Cardiovascular;  Laterality: N/A;  . RIGHT/LEFT HEART CATH AND CORONARY ANGIOGRAPHY N/A 04/02/2019   Procedure: RIGHT/LEFT HEART CATH AND CORONARY ANGIOGRAPHY;  Surgeon: Martinique, Peter M, MD;  Location: [place] [facility];  Service: Cardiovascular;  Laterality: N/A;  . TEE WITHOUT CARDIOVERSION N/A 04/28/2019   Procedure: TRANSESOPHAGEAL ECHOCARDIOGRAM (TEE);  Surgeon: Prescott Gum, Collier Salina, MD;  Location: [place];  Service: Open Heart Surgery;  Laterality: N/A;  . TEE WITHOUT CARDIOVERSION N/A 05/10/2019   Procedure: TRANSESOPHAGEAL ECHOCARDIOGRAM (TEE);  Surgeon: Prescott Gum, Collier Salina, MD;  Location: [place];  Service: Open Heart Surgery;  Laterality: N/A;  . TRACHEOSTOMY TUBE PLACEMENT N/A 05/01/2019   Procedure: TRACHEOSTOMY;  Surgeon: Ivin Poot, MD;  Location: [place];  Service: Thoracic;  Laterality: N/A;  . VIDEO BRONCHOSCOPY N/A 05/07/2019   Procedure: VIDEO BRONCHOSCOPY USING DISPOSABLE ANESTHESIA SCOPE;  Surgeon: Ivin Poot, MD;  Location: [place];  Service: Thoracic;  Laterality: N/A;   HPI:      Assessment / Plan / Recommendation Clinical Impression  PMV evaluation complete. Patient with excellent tolerance of PMV on first trial, able to wear for 25 minutes with no evidence of distress or air trapping upon  valve removal. All vital signs remaining WFL. Patient initially aphonic with vocal quality improving with SLP cueing to clear suspected secretions in laryngeal vestibule via cough. Voice then improving however continues to remain hoarse, breathy, and with low intensity. Given good tolerance of valve from a respiratory standpoint with clear evidence of intact upper airway patency, suspect that poor vocal quality is largely due to multiple intubations since admission. Spouse and daughter present for evaluation. All provided with education regarding valve placement particularly need for cuff deflation prior as well as recommendations which are noted below. Recommend PMV use with SLP, RN, and RT only at this time, cuff must be deflated prior to use. Valve removed following evaluation and cuff reinflated. MD, if ok, recommend cuff deflation at baseline to facilitate use of upper airway as patient tolerates. Plan to proceed with FEES next date to evaluate swallowing function.    SLP Visit Diagnosis: Aphonia (R49.1)    SLP Assessment  Patient needs continued Speech Lanaguage Pathology Services    Follow Up Recommendations  (TBD )    Frequency and Duration min 3x week  2 weeks    PMSV Trial PMSV was placed for: 25 minutes Able to redirect subglottic air through upper airway: Yes Able to Attain Phonation: Yes Voice Quality: Breathy;Hoarse;Low vocal intensity Able to Expectorate Secretions: Yes Level of Secretion Expectoration with PMSV: Oral Breath Support for Phonation: Moderately decreased Intelligibility: Intelligibility reduced Word: 0-24% accurate Phrase: 0-24% accurate Sentence: 0-24% accurate Conversation: 0-24% accurate Respirations During Trial: 17 SpO2 During Trial: 100 % Pulse During Trial: 80 Behavior: Alert;Controlled;Cooperative;Expresses self well   Tracheostomy Tube  Additional Tracheostomy Tube Assessment Fenestrated: No Trach Collar Period: vented at night Secretion  Description: moderate, clearing independently via trach Level of Secretion Expectoration: Tracheal    Vent Dependency  Vent Dependent: Yes Vent Mode: (nocturnal) FiO2 (%): 35 %    Cuff Deflation Trial   Demarian Epps MA, CCC-SLP   Tolerated Cuff Deflation: Yes Length of Time for Cuff Deflation Trial: 30 minutes Behavior: Alert;Controlled;Cooperative;Expresses self well        Laquia Rosano Meryl 05/05/2019, 12:15 PM

## 2019-05-05 NOTE — Progress Notes (Addendum)
NAME:  Brendan [name], MRN:  [id number], DOB:  20-Jul-1955, LOS: 24 ADMISSION DATE:  04/06/2019, CONSULTATION DATE:  04/17/19 REFERRING MD:  Dr. Haroldine Laws, CHIEF COMPLAINT:  Respiratory distress   Brief History   89 yoM originally presented with SOB and fatigue at [facility] found to have new HFrEF, NSTEMI, and bilateral pleural effusions transferred to [facility] on 3/29 for further cardiac evaluation. 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 Started on lasix and milrinone gtts however developed NSVT. Was taken 4/1 for placement of IABP and swan for optimization prior to CABG. Was on precedex for agitation/ confusion, some concern for DTs. On the evening of 4/1, patient developed worsening respiratory distress and hypoxia, PCCM consulted for intubation and vent management.   He was successfully extubated on 4/4 and PCCM signed off. Impella was removed 4/11 and patient noted to have increased WOB throughout the day he was placed on Bipap, but mental status continued to worsen and PCCM consulted for possible re-intubation  History of present illness   Brendan [name] is a 64 yo male withpoorly controlled diabetes complicated by neuropathy and hypertension who presented to an outside hospital on 3/26 with a chief complaint of general malaise and shortness of breath for the past 4 days. He is now transferred to [facility] for evaluation of troponin elevation, evaluation of newly depressed EF,and consideration for coronary angiography.  History obtained from patient report as well as discharge paperwork from outside hospital. Briefly, patient reportedly had nausea, vomiting, diarrhea approximately 1 week and is confined to his bed. He finally presented to the outside hospital ED he was found to have a blood sugar of 516, BUN of 53, creatinine 1.2, and anion gap of 17. Patient's BNP was elevated to 8900. Troponin was elevated to 13.1 with changes noted by the team of their lateral leads as well as V1 and V2.  Patient was started on aspirin and a heparin drip. Chest x-ray on admission was concerning for bilateral pulmonary opacities differential including infection and pulmonary edema. So he was started on empiric ceftriaxone and doxycycline, although his procalcitonin was negative. He had a chest wall echo that was performed that showed an EF of 25% was diuresed during his admission with improvement in his shortness of breath as well as radiographically. On discussion with patient he has been chest pain-free since his admission totheoutside hospital. In addition, he feels that his shortness of breath is much improved.the  Patient had left heart cath on 03/17/2019 showing severe three-vessel disease. 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 He then developed worsening cardiogenic shock and was intubated on 4/1 andplaced on a IABP on 4/02. He also had bilateral chest tubes placed with3 L removed from each side. He was then extubated on 4/4. On 4/6 patient had CABG/MVRperformed and was extubated on 4/7 with improved mentation with weaning of sedation and pressors. Patient had Impella removed on 4/9 and subsequent TEE showing an EF of 35%with trivial MR s/p MV repair. Remained on BiPAP at this time.On 4/10 nephrology was consulted due to anuric sinceCABG and started on CRRT.Developing worsening shortness of breath and increased work of breathing on BiPAP with hypoxia on ABG and subsequently reintubated on 4/12.  Patient was extubated on 4/14 and developed worsening hypoxia on BiPAP. Overnight he developed pulseless VT requiring 1 minute of CPR and epinephrine to achieve ROSC. Neurovascularly intact afterward. Subsequently had increased work of breathing and worsening respiratory distress requiring intubation.  Patient had tracheostomy performed on  05/11/2019. Trying to wean off ventilator.   Past Medical History  Tobacco abuse, HTN, poorly  controlled diabetes, diabetic neuropathy  Significant Hospital Events   3/30 Lt heart cath/TEE performed 4/1 Intubated, RHC/IABP/PA cath 4/2 Impella placement 4/3 febrile over evening and night. Blood cultures obtained. Vanc/cefepime started. Hematuria--heparin held. 4/4 Extubated,abx transitioned to rocephin. Heparin restarted 4/6 Re-intubated for CABG/MVR 4/7 Extubated from CABG to BiPAP 4/9 Impella removed  4/9 TEE 4/10 CVC inserted &CRRT initiated  4/12 PCCMre-consulted re-intubation 4/14 Extubated to BiPAP 4/15 Re-intubated due to respiratory distress  Consults:  TCTS PCCM HF  Procedures:  4/15ET tube - 4/15 4/9 Cortrak> 4/6 Rt IJ> 4/10 HD cath- 4/13 4/12 foley cath> 3/30 PICC> 4/13 HD cath> 4/16 Tracheostomy>  Significant Diagnostic Tests:  3/30 R/ LHC >>  Ost LM to Mid LM lesion is 35% stenosed.  Prox LAD lesion is 90% stenosed.  Prox LAD to Mid LAD lesion is 50% stenosed.  Mid Cx lesion is 100% stenosed.  Prox RCA to Mid RCA lesion is 90% stenosed.  RPDA lesion is 95% stenosed.  LV end diastolic pressure is severely elevated.  Hemodynamic findings consistent with moderate pulmonary hypertension. 1. Severe 3 vessel obstructive CAD 2. High LV filling pressures 3. Reduced cardiac output with index 2.3 4. Moderate pulmonary HTN.  3/30TTE >> 1. Left ventricular ejection fraction, by estimation, is 30 to 35%. The left ventricle has moderately decreased function. The left ventricle demonstrates regional wall motion abnormalities.There is mild left ventricular hypertrophy. Left ventricular diastolic function could not be evaluated. There is severe hypokinesis of the left ventricular, entire inferior wall, inferoseptal wall, apical segment and lateral wall.  2. Right ventricular systolic function is hyperdynamic. The right ventricular size is normal. There is moderately elevated pulmonary artery systolic pressure.  3. Decreased posterior leaflet  motion due to ischemic tethering of the mitral valve leaflets.  4. The mitral valve is abnormal. Moderate to severe mitral valve regurgitation.  5. The aortic valve is tricuspid. Aortic valve regurgitation is not visualized. Mild aortic valve sclerosis is present, with no evidence of aortic valve stenosis.  6. The inferior vena cava is normal in size with greater than 50% respiratory variability, suggesting right atrial pressure of 3 mmHg.   4/1 CT chest w/o >>Large bilateral pleural effusions with associated atelectasis.Moderate to severe bilateral ground-glass opacities, likely reflecting pneumonia and/or edema.  4/1 RHC >> RA = 18 RV = 60/20  PA = 63/29 (42) PCW = 33 Fick cardiac output/index = 3.7/1.9 PVR = 2.5 WU FA sat = 98% PA sat = 47%, 49% PaPi = 2.2  4/2 CXR>bilateral chest tubes present. Significantly improved since morning xray. Infiltrate vs atelectasis at right base.  04/12/21CXR>>vascular congestion and extensive bilateral airspace disease, probably reflecting edema  04/29/19 CXR>>1. New left subclavian dialysis catheter without complicating feature. 2. Interval removal of the left-sided chest tube. No pneumothorax. 3. Unchanged pulmonary edema.  05/01/2019 CXR>Radiographically appropriate position of endotracheal tube.  4/16 CXR> Stable support apparatus. Bilateral lung opacities are noted, right greater than left, concerning for worsening pneumonia or edema. No pneumothorax is noted.  Micro Data:  3/30 MRSA PCR >> neg 4/1 MRSA PCR >> neg 4/2 resp culture>>rare gram pos cocci, few candida albicans 4/3 Urine culture>>negative 4/4 blood cultures>>no growth  4/9 Surgical wound>> rare enterococcus faecalis 4/9 BCx2>>no gorwth 4/9 Urine Culture>> greater than 100k yeast  Antimicrobials:  PTA ceftriaxone/ doxycycline >> 3/29 Ceftriaxone 4/4-4/5 Cefepime 4/4>>4/11 vanc 4/4>>4/6 Rocephin 4/4>>4/5 Cefuroxime 4/6-4/7 Ampicillin 4/11 Vancomycin  4/12>>4/13 Cefepime 4/12>>4/13 Augmentin 4/15>  Interim history/subjective:  Patient resting comfortably in bed on ventilator. Able to move all four extremities spontaneously and denies any new symptoms today.  Objective   Blood pressure (!) 112/53, pulse (!) 28, temperature 97.7 F (36.5 C), resp. rate 15, height _0  (1.803 m), weight 61.9 kg, SpO2 100 %. CVP:  [6 mmHg-10 mmHg] 10 mmHg  Vent Mode: PRVC FiO2 (%):  [35 %-40 %] 40 % Set Rate:  [23 bmp] 23 bmp Vt Set:  [450 mL] 450 mL PEEP:  [5 cmH20] 5 cmH20 Plateau Pressure:  [16 cmH20] 16 cmH20   Intake/Output Summary (Last 24 hours) at 05/05/2019 0551 Last data filed at 05/05/2019 0500 Gross per 24 hour  Intake 2801.17 ml  Output 3673 ml  Net -871.83 ml   Filed Weights   05/04/19 0000 05/04/19 0256 05/05/19 0143  Weight: 65.8 kg 65.8 kg 61.9 kg    Examination: General appearance: 64 y.o., male, trach in place, on vent  Eyes: alert, tracking  HENT: tracheostomy in place  Lungs: BL vented breaths  CV: RRR, s1 s2, pacer wires still in place  Abdomen: soft, nt nd  Extremities: no edema  Neuro: alert, oriented, following commands   Resolved Hospital Problem list     Assessment & Plan:  Acute hypoxic respiratory failure, multifactorial - Titrate Trach collar to maintain SPO2 greater than or equal to 90%,HOB elevated to 30 degrees - BAL grew rare enterococcus, on 7 day course of Augmentin.  -On day 6 of Augmentin, continue for 7 days for aspiration pneumonia. - again trial off vent today  - Continue TCT as tolerated - Speech evaluation today. - pulmonary will follow for vent management and trach care   HFrEF (20-25%), cardiogenic shock, NSTEMI with three-vessel disease, NSVT Polymorphic VT - amiodarone and milrinone per surgery service   Severe Malnutrition - continue TF   AKI possibly ATN in the setting of cardiogenic shock, anuric  Cr stable at 1.56 - cvvhd per nephrology   Diabetes mellitus -  lantus + SSI  - goal 140-180 CBGs   Elevated transaminase enzymes with cholestasis pattern - RUQ US shows previous cholecystectomy, without bile duct dilation. - related to above, will observe  Best practice:  Diet: Tube feeds Pain/Anxiety/Delirium protocol (if indicated):Fentanyl VAP protocol (if indicated):HOB 30 degrees,  DVT prophylaxis: heparin  GI prophylaxis: PPI Glucose control:SSI Mobility: BR Code Status: Full  Family Communication:per primary team  Disposition:ICU  Labs   CBC: Recent Labs  Lab 05/06/2019 0334 04/21/2019 0334 04/29/2019 1526 04/23/2019 1535 05/03/19 0317 05/03/19 0317 05/03/19 1356 05/04/19 0303 05/04/19 0422 05/05/19 0332 05/05/19 0441  WBC 16.6*  --  21.1*  --  20.0*  --   --  20.8*  --  17.8*  --   HGB 7.9*   < > 8.4*   < > 7.7*   < > 8.1* 8.0* 9.2* 7.7* 8.8*  HCT 24.2*   < > 25.3*   < > 24.0*   < > 24.9* 24.8* 27.0* 24.2* 26.0*  MCV 87.1  --  86.6  --  86.3  --   --  88.3  --  89.0  --   PLT 187  --  227  --  258  --   --  266  --  296  --    < > = values in this interval not displayed.    Basic Metabolic Panel: Recent Labs  Lab 05/01/19 0449 05/01/19 1547 05/14/2019 0334 04/29/2019 1526  05/03/19 0317 05/03/19 0317 05/03/19 1650 05/03/19 1650 05/04/19 0303 05/04/19 0422 05/04/19 1530 05/05/19 0332 05/05/19 0441  NA 136   < > 135   < > 135   < > 134*   < > 136  134* 136 138 137  138 139  K 4.8   < > 4.0   < > 4.4   < > 4.6   < > 4.6  4.4 4.7 4.4 4.5  4.4 4.4  CL 102   < > 101   < > 102  --  101  --  102  100  --  102 104  103  --   CO2 24   < > 23   < > 23  --  23  --  24  24  --  _0 --   GLUCOSE 135*   < > 188*   < > 147*  --  241*  --  179*  198*  --  123* 139*  133*  --   BUN 29*   < > 26*   < > 29*  --  26*  --  22  21  --  _1 --   CREATININE 1.58*   < > 1.28*   < > 1.54*  --  1.27*  --  1.24  1.21  --  1.13 1.16  1.17  --   CALCIUM 7.3*   < > 7.1*   < > 7.1*  --  6.9*  --  7.2*  7.0*  --  7.4*  7.3*  7.4*  --   MG 2.6*  --  2.7*  --  2.4  --   --   --  2.4  --   --  2.5*  --   PHOS 2.1*   < > 1.3*   < > 2.2*  --  2.9  --  2.0*  --  4.1 2.1*  --    < > = values in this interval not displayed.   GFR: Estimated Creatinine Clearance: 58 mL/min (by C-G formula based on SCr of 1.17 mg/dL). Recent Labs  Lab 04/23/2019 1526 05/03/19 0317 05/04/19 0303 05/05/19 0332  WBC 21.1* 20.0* 20.8* 17.8*    Liver Function Tests: Recent Labs  Lab 04/30/19 0436 04/30/19 1600 05/01/19 0449 05/01/19 1547 05/03/19 0317 05/03/19 1650 05/04/19 0303 05/04/19 1530 05/05/19 0332  AST 180*  --  301*  --  361*  --  286*  --  327*  ALT 198*  --  231*  --  222*  --  239*  --  255*  ALKPHOS 488*  --  501*  --  558*  --  529*  --  596*  BILITOT 1.0  --  0.8  --  0.8  --  1.2  --  0.8  PROT 5.2*  --  5.1*  --  5.3*  --  5.4*  --  5.3*  ALBUMIN 1.3*  1.4*   < > 1.4*  1.4*   < > 1.7* 2.0* 2.1*  2.0* 1.9* 1.8*  1.7*   < > = values in this interval not displayed.   No results for input(s): LIPASE, AMYLASE in the last 168 hours. No results for input(s): AMMONIA in the last 168 hours.  ABG    Component Value Date/Time   PHART 7.470 (H) 05/05/2019 0441   PCO2ART 38.2 05/05/2019 0441   PO2ART 59.0 (L) 05/05/2019 0441   HCO3 27.9 05/05/2019  0441   TCO2 29 05/05/2019 0441   ACIDBASEDEF 3.0 (H) 04/28/2019 0735   O2SAT 92.0 05/05/2019 0441     Coagulation Profile: Recent Labs  Lab 05/01/19 0449  INR 1.6*    Cardiac Enzymes: No results for input(s): CKTOTAL, CKMB, CKMBINDEX, TROPONINI in the last 168 hours.  HbA1C: HbA1c, POC (controlled diabetic range)  Date/Time Value Ref Range Status  01/14/2019 02:02 PM 12.5 (A) 0.0 - 7.0 % Final  08/21/2017 02:03 PM 8.5 (A) 0.0 - 7.0 % Final   Hgb A1c MFr Bld  Date/Time Value Ref Range Status  03/21/2019 09:49 PM 9.0 (H) 4.8 - 5.6 % Final    Comment:    (NOTE) Pre diabetes:          5.7%-6.4% Diabetes:              >6.4% Glycemic control  for   <7.0% adults with diabetes     CBG: Recent Labs  Lab 05/04/19 1109 05/04/19 1534 05/04/19 1937 05/04/19 2348 05/05/19 0332  GLUCAP 252* 101* 147* 174* 135*    Review of Systems:   None except for what is noted in subjective.  Past Medical History  He,  has a past medical history of Cholecystitis (09/3233), Complication of anesthesia, Diabetes mellitus without complication ([place]), Erectile dysfunction (2010), HTN (hypertension), NSTEMI (non-ST elevated myocardial infarction) ([place]) (04/13/2019), and PONV (postoperative nausea and vomiting).   Surgical History    Past Surgical History:  Procedure Laterality Date  . APPENDECTOMY  1969  . BRONCHIAL WASHINGS N/A 05/06/2019   Procedure: Bronchial Washings;  Surgeon: Ivin Poot, MD;  Location: [place];  Service: Open Heart Surgery;  Laterality: N/A;  . CHOLECYSTECTOMY    . CHOLECYSTECTOMY N/A 04/02/2013   Procedure: LAPAROSCOPIC CHOLECYSTECTOMY WITH  INTRAOPERATIVE CHOLANGIOGRAM;  Surgeon: Joyice Faster. Cornett, MD;  Location: [place];  Service: General;  Laterality: N/A;  . CORONARY ARTERY BYPASS GRAFT N/A 05/03/2019   Procedure: CORONARY ARTERY BYPASS GRAFTING (CABG) x3, USING LEFT INTERNAL MAMMARY ARTERY AND LEFT LEG GREATER SAPHENOUS VEIN HARVESTED ENDOSCOPICALLY;  Surgeon: Ivin Poot, MD;  Location: [place];  Service: Open Heart Surgery;  Laterality: N/A;  Inhaled Nitric-Oxide  . ERCP N/A 04/02/2013   Procedure: ENDOSCOPIC RETROGRADE CHOLANGIOPANCREATOGRAPHY (ERCP);  Surgeon: Inda Castle, MD;  Location: [facility];  Service: Endoscopy;  Laterality: N/A;  . 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   . IABP INSERTION N/A 05/08/2019   Procedure: IABP INSERTION;  Surgeon: Jolaine Artist, MD;  Location: [place] [facility];  Service: Cardiovascular;  Laterality: N/A;  . MITRAL VALVE REPLACEMENT N/A 05/12/2019   Procedure: MITRAL VALVE (MV) REPAIR USING PHYSIO II RING SIZE 26MM;   Surgeon: Prescott Gum, Collier Salina, MD;  Location: [place];  Service: Open Heart Surgery;  Laterality: N/A;  . PLACEMENT OF IMPELLA LEFT VENTRICULAR ASSIST DEVICE Right 05/09/2019   Procedure: Placement of Impella 5.5 Direct;  Surgeon: Ivin Poot, MD;  Location: [place];  Service: Open Heart Surgery;  Laterality: Right;  . REMOVAL OF IMPELLA LEFT VENTRICULAR ASSIST DEVICE N/A 05/10/2019   Procedure: REMOVAL OF IMPELLA 5.5 direct, LEFT VENTRICULAR ASSIST [place], TEE;  Surgeon: Ivin Poot, MD;  Location: [place];  Service: Open Heart Surgery;  Laterality: N/A;  . RIGHT HEART CATH N/A 04/19/2019   Procedure: RIGHT HEART CATH;  Surgeon: Jolaine Artist, MD;  Location: [place] [facility];  Service: Cardiovascular;  Laterality: N/A;  .  RIGHT/LEFT HEART CATH AND CORONARY ANGIOGRAPHY N/A 03/26/2019   Procedure: RIGHT/LEFT HEART CATH AND CORONARY ANGIOGRAPHY;  Surgeon: Martinique, Peter M, MD;  Location: [place] [facility];  Service: Cardiovascular;  Laterality: N/A;  . TEE WITHOUT CARDIOVERSION N/A 05/07/2019   Procedure: TRANSESOPHAGEAL ECHOCARDIOGRAM (TEE);  Surgeon: Prescott Gum, Collier Salina, MD;  Location: [place];  Service: Open Heart Surgery;  Laterality: N/A;  . TEE WITHOUT CARDIOVERSION N/A 05/11/2019   Procedure: TRANSESOPHAGEAL ECHOCARDIOGRAM (TEE);  Surgeon: Prescott Gum, Collier Salina, MD;  Location: [place];  Service: Open Heart Surgery;  Laterality: N/A;     Social History   reports that he has never smoked. His smokeless tobacco use includes snuff and chew. He reports previous alcohol use. He reports that he does not use drugs.   Family History   His family history includes Colon cancer in his maternal aunt; Diabetes in his maternal grandmother; Stroke in his maternal grandmother.   Allergies Allergies  Allergen Reactions  . Acetaminophen Itching     Home Medications  Prior to Admission medications   Medication Sig Start Date End Date Taking? Authorizing Provider  Insulin Glargine (BASAGLAR KWIKPEN)  100 UNIT/ML Inject 28 Units into the skin at bedtime.   Yes [provider]  insulin lispro (HUMALOG) 100 UNIT/ML injection Inject 8 Units into the skin 2 (two) times daily before a meal.   Yes [provider]  metFORMIN (GLUCOPHAGE) 500 MG tablet Take 1 tablet (500 mg total) by mouth 2 (two) times daily with a meal. 06/07/17  Yes Hensel, Jamal Collin, MD  glucose blood (RELION GLUCOSE TEST STRIPS) test strip Use as instructed 02/24/19   [name], MD  Insulin Pen Needle (PEN NEEDLES) 32G X 4 MM MISC     [provider]  ReliOn Ultra Thin Lancets MISC Use to test blood sugar up to 4 times daily. 07/28/13   Coral Spikes, DO     Critical care time:      Marianna Payment, D.O. [facility], PGY-1 Pager: [phone], Phone: [phone] Date 05/05/2019 Time 6:40 AM  PCCM attending:  This is a 64 year old gentleman admitted for acute systolic heart failure, NSTEMI found to have three-vessel coronary disease taken for CABG Hospital course complicated by cardiogenic shock prolonged mechanical support requiring Impella as well as CVVHD.  Pulmonary was consulted for management after reintubation and decision was made for repeat attempt at extubation.  Unfortunately decision was made for prolonged mechanical support and ventilator we need including tracheostomy tube.  We have been following for management of ventilator support and trach needs.  At this time patient doing well on trach collar trial during the day and has required nocturnal ventilation.  BP (!) 93/45   Pulse (!) 37   Temp (!) 97.2 F (36.2 C)   Resp 12   Ht _0  (1.803 m) Comment: measured x 3  Wt 61.9 kg   SpO2 99%   BMI 19.03 kg/m   General: Elderly, cachectic male resting comfortably in bed alert oriented tracheostomy tube in place. HEENT: Trach in place Heart: Regular rhythm S1-S2 Lungs: Bilateral breaths, no crackles, no wheeze  Labs: Reviewed Chest x-ray: Mild increase  in pulmonary edema as compared to previous x-rays. The patient's images have been independently reviewed by me.    Assessment: Chronic hypoxemic respiratory failure requiring tracheostomy tube placement and nocturnal ventilation. Acute systolic heart failure, cardiogenic shock, NSTEMI with three-vessel coronary disease, improved. AKI in the setting of cardiogenic shock, CVVHD  Plan: Pulmonary  will follow as needed for any tracheostomy tube needs. At this time appears to be patient doing well with trach collar trial. Tracheostomy tube was placed by cardiothoracic surgery. If we can be of any help please let us know. We will continue to follow peripherally.  Please call with any questions or concerns.  Garner Nash, DO [facility] 05/05/2019 5:50 PM

## 2019-05-05 NOTE — Progress Notes (Addendum)
Patient ID: Brendan [name], male   DOB: 03-26-1955, 64 y.o.   MRN: [id number]     Advanced Heart Failure Rounding Note  PCP-Cardiologist: No primary care provider on file.   Subjective:    Events - Presented to [facility] with acute HF  EF 20-25% - Transferred [facility] - Cath 3/30 with severe 3 vessel disease. EF 25% - Developed PMVT on milrinone -> milrinone stopped -> developed worsening shock -> IABP placed on 4/1 - Clinical deterioration 4/1-> intubated - 4/2 underwent Impella 5.5 placement earlier and bilateral chest tubes with 3L out from each side.  - Extubated 4/4 - 05/01/2019 CABG and MVR - Extubated 4/7 - Back to OR for Impella 5.5 extraction 4/9, extubated.  TEE with EF 35%, RV ok, trivial MR s/p MV repair.  - CVVH started 4/10 with oliguria, rising creatinine and CVP.  - Deteriorating respiratory status, intubated again early am 4/12.  Enterococcus faecalis in sputum.  - Extubated 4/14.  Developed respiratory distress and started on Bipap.  Had VT arrest with CPR/epinephrine, intubated again 4/15 early am.  - Tracheostomy 4/16 - Atrial fibrillation early am 4/17  Remains on vent through TC. FiO2 40%.   On NE 17, vasopressin 0.03, milrinone 0.125. Co-ox 57%. MAPs marginal in mid 60s w/ NE wean.   Remains on CVVHD. CVP 11.    He is now on unasyn with E faecalis in sputum. Had 2 doses of fluconazole with candida in urine - now off. Afebrile (Tmax 97.5), WBCs trending down at 17K.   Echo (4/10): EF 25-30%, normal RV, no significant MR.  I do not think there is an apical thrombus (think trabeculation).    Objective:   Weight Range: 61.9 kg Body mass index is 19.03 kg/m.   Vital Signs:   Temp:  [95.9 F (35.5 C)-97.9 F (36.6 C)] 97.5 F (36.4 C) (04/19 0700) Pulse Rate:  [25-117] 38 (04/19 0700) Resp:  [10-38] 26 (04/19 0700) BP: (83-113)/(34-91) 102/51 (04/19 0700) SpO2:  [93 %-100 %] 100 % (04/19 0700) FiO2 (%):  [35 %-40 %] 40 % (04/19 0400) Weight:  [61.9 kg]  61.9 kg (04/19 0143) Last BM Date: 05/04/19  Weight change: Filed Weights   05/04/19 0000 05/04/19 0256 05/05/19 0143  Weight: 65.8 kg 65.8 kg 61.9 kg    Intake/Output:   Intake/Output Summary (Last 24 hours) at 05/05/2019 0714 Last data filed at 05/05/2019 0700 Gross per 24 hour  Intake 2756.5 ml  Output 3470 ml  Net -713.5 ml      Physical Exam   CVP 11 General:  Thin chronically ill appearing WM. Sitting up in bed.  HEENT: normal + NTG Neck: supple. no JVD.  + Trach collar, Carotids 2+ bilat; no bruits. No lymphadenopathy or thryomegaly appreciated. + HD cath Cor: PMI nondisplaced. Regular rate & rhythm. No rubs, gallops or murmurs. Sternotomy site stabe, + pacer wires  Lungs: on vent through trach, coarse BS bilaterally  Abdomen: soft, nontender, nondistended. No hepatosplenomegaly. No bruits or masses. Good bowel sounds. Extremities: no cyanosis, clubbing, rash, 1+ bilateral edema Neuro: alert. follows commands, cranial nerves grossly intact. moves all 4 extremities w/o difficulty.    Telemetry   NSR 70s with PACs.  5 beat run of NSVT overnight Personally reviewed   Labs    CBC Recent Labs    05/04/19 0303 05/04/19 0422 05/05/19 0332 05/05/19 0441  WBC 20.8*  --  17.8*  --   HGB 8.0*   < > 7.7* 8.8*  HCT 24.8*   < > 24.2* 26.0*  MCV 88.3  --  89.0  --   PLT 266  --  296  --    < > = values in this interval not displayed.   Basic Metabolic Panel Recent Labs    05/04/19 0303 05/04/19 0422 05/04/19 1530 05/04/19 1530 05/05/19 0332 05/05/19 0441  NA 136  134*   < > 138   < > 137  138 139  K 4.6  4.4   < > 4.4   < > 4.5  4.4 4.4  CL 102  100   < > 102  --  104  103  --   CO2 24  24   < > 25  --  25  26  --   GLUCOSE 179*  198*   < > 123*  --  139*  133*  --   BUN 22  21   < > 20  --  21  21  --   CREATININE 1.24  1.21   < > 1.13  --  1.16  1.17  --   CALCIUM 7.2*  7.0*   < > 7.4*  --  7.3*  7.4*  --   MG 2.4  --   --   --  2.5*  --    PHOS 2.0*   < > 4.1  --  2.1*  --    < > = values in this interval not displayed.   Liver Function Tests Recent Labs    05/04/19 0303 05/04/19 0303 05/04/19 1530 05/05/19 0332  AST 286*  --   --  327*  ALT 239*  --   --  255*  ALKPHOS 529*  --   --  596*  BILITOT 1.2  --   --  0.8  PROT 5.4*  --   --  5.3*  ALBUMIN 2.1*  2.0*   < > 1.9* 1.8*  1.7*   < > = values in this interval not displayed.   No results for input(s): LIPASE, AMYLASE in the last 72 hours. Cardiac Enzymes No results for input(s): CKTOTAL, CKMB, CKMBINDEX, TROPONINI in the last 72 hours.  BNP: BNP (last 3 results) Recent Labs    03/26/2019 0113  BNP 655.7*    ProBNP (last 3 results) No results for input(s): PROBNP in the last 8760 hours.   D-Dimer No results for input(s): DDIMER in the last 72 hours. Hemoglobin A1C No results for input(s): HGBA1C in the last 72 hours. Fasting Lipid Panel No results for input(s): CHOL, HDL, LDLCALC, TRIG, CHOLHDL, LDLDIRECT in the last 72 hours. Thyroid Function Tests No results for input(s): TSH, T4TOTAL, T3FREE, THYROIDAB in the last 72 hours.  Invalid input(s): FREET3  Other results:   Imaging    No results found.   Medications:     Scheduled Medications: . amiodarone  200 mg Oral Daily  . aspirin EC  325 mg Oral Daily   Or  . aspirin  324 mg Per Tube Daily  . B-complex with vitamin C  1 tablet Per Tube Daily  . bisacodyl  10 mg Oral Daily   Or  . bisacodyl  10 mg Rectal Daily  . chlorhexidine gluconate (MEDLINE KIT)  15 mL Mouth Rinse BID  . Chlorhexidine Gluconate Cloth  6 each Topical Daily  . docusate  200 mg Per Tube Daily  . feeding supplement (PRO-STAT SUGAR FREE 64)  30 mL Per Tube BID  . gabapentin  300  mg Per Tube Q8H  . insulin aspart  0-24 Units Subcutaneous Q4H  . insulin aspart  4 Units Subcutaneous Q4H  . insulin glargine  10 Units Subcutaneous BID  . mouth rinse  15 mL Mouth Rinse 10 times per day  . midodrine  10 mg Per  Tube TID WC  . pantoprazole sodium  40 mg Per Tube Daily  . pneumococcal 23 valent vaccine  0.5 mL Intramuscular Tomorrow-1000  . rosuvastatin  10 mg Per Tube q1800  . sodium chloride flush  10-40 mL Intracatheter Q12H    Infusions: .  prismasol BGK 4/2.5 400 mL/hr at 05/05/19 0301  .  prismasol BGK 4/2.5 200 mL/hr at 05/05/19 0620  . sodium chloride    . ampicillin-sulbactam (UNASYN) IV Stopped (05/05/19 0024)  . epinephrine Stopped (04/28/19 0138)  . feeding supplement (VITAL 1.5 CAL) 50 mL/hr at 05/05/19 0500  . feeding supplement (VITAL AF 1.2 CAL) 1,000 mL (04/27/2019 1354)  . fentaNYL infusion INTRAVENOUS Stopped (04/30/19 1211)  . heparin 10,000 units/ 20 mL infusion syringe 1,000 Units/hr (05/05/19 0616)  . lactated ringers Stopped (05/04/2019 1640)  . lactated ringers Stopped (04/30/19 0930)  . milrinone 0.125 mcg/kg/min (05/05/19 0700)  . norepinephrine (LEVOPHED) Adult infusion 17 mcg/min (05/05/19 0703)  . prismasol BGK 4/2.5 1,800 mL/hr at 05/05/19 0711  . vasopressin (PITRESSIN) infusion - *FOR SHOCK* 0.03 Units/min (05/05/19 0712)    PRN Medications: Place/Maintain arterial line **AND** sodium chloride, alteplase, fentaNYL, fentaNYL (SUBLIMAZE) injection, heparin, levalbuterol, metoprolol tartrate, ondansetron (ZOFRAN) IV, ondansetron (ZOFRAN) IV, sodium chloride, sodium chloride flush     Assessment/Plan    1. Acute systolic HF ->  Cardiogenic Shock  - Due to iCM. EF 20-25% - Impella 5.5 placed on 4/2.   - s/p CABG/MVRepair on 4/6  - Impella out 4/9.  - Echo 4/10 with EF 25-30%, normal RV, no MR.   - Now on CVVH with volume overload/progressive renal failure. CVP 11 today. Weight back below prior baseline. Continue CVVHD - Co-ox 57% on norepinephrine 17 + vasopressin 0.03 + milrinone 0.125. MAP marginal today. Will increase midodrine to 15 tid and continue to wean NE and VP as tolerated. Will need to follow co-ox closely.   2. CAD - LHC with severe 3 vessel CAD   - s/p CABG x 3 MV Repair 4/6  - on ASA/Crestor  3. Acute hypoxic respiratory failure - Due to pulmonary edema and PNA - Extubated 4/9 re-intubated on 4/12. Failed re-extubation on 4/14. Now s/p tracheostomy - CVP 11. Continue CVVH.  - Continue Unasyn for facaelis PNA - Now on TC trials. CCM following. Apprecaite their support   4. Mitral Regurgitation - Mod-severe on ECHO - s/p MV repair, TEE 4/9 with minimal MR s/p repair.   - Echo 4/10 with no significant MR.   5. Uncontrolled DM - Hgb A1C 9.  - On insulin and sliding scale.   6. AKI - due to shock - Became anuric and now on CVVH to control volume overload.   - Remains anuric post-op. Hopefully wil get some recovery Renal following.   7. Acute blood loss Anemia  - HGb 7.7 -> 8.8 after 1u RBCS on 4/17  8. Polymorphic VT - Recurrent VT during respiratory distress 4/14, - Now on po amio - no further VT on tele  - Keep K> 4.0 Mg > 2.0   9. 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 No sensation R and L foot and occasionaly in his hands. Requires assistance with  ADLs.   10. Severe Malnutrition - Prealbumin 8.9  - Nutrition on board - Continue TFs  11. ID - Enterococcus faecalis in sputum => Unasyn - Candida in urine => completed fluconazole course.   12. Elevated LFTs - Suspect shock liver - AST/ALT remain stably elevated.  13. Atrial fibrillation. paroxysmal - Now back in NSR. On po amio per TCTS. Can switch back to IV as needed  14. Debility, severe - will need SNF on d/c   Brittainy Ladoris Gene 05/05/2019 7:14 AM  Agree with above.   Was on TC for 12 hours yesterday now back on vent but comfortable. Awake and following commands.  On NE 17 + vasopressin 0.03 + milrinone 0.125. Co-ox 57% and MAPs marginal. Still on CVVHD. Keeping even. Denies CP.   General:  Weak appearing. On vent through trach HEENT: normal + cor-tral Neck: supple. JVP jaw. + Trach Carotids 2+ bilat; no bruits. No  lymphadenopathy or thryomegaly appreciated. + IJ cath Cor: PMI nondisplaced. Regular rate & rhythm. No rubs, gallops or murmurs. Incisions ok  Lungs: coarse Abdomen: soft, nontender, nondistended. No hepatosplenomegaly. No bruits or masses. Good bowel sounds. Extremities: no cyanosis, clubbing, rash, edema Neuro: alert & orientedx3, cranial nerves grossly intact. moves all 4 extremities w/o difficulty. Affect pleasant  Remains very tenuous. Still on triple pressors with marginal co-ox and MAPs. Would wean VP as tolerated first. Increase midodrine to 15 tid. Keep MAP > 70 to help promote renal recovery. Continue TC trials.   CRITICAL CARE Performed by: Glori Bickers  Total critical care time: 35 minutes  Critical care time was exclusive of separately billable procedures and treating other patients.  Critical care was necessary to treat or prevent imminent or life-threatening deterioration.  Critical care was time spent personally by me (independent of midlevel providers or residents) on the following activities: development of treatment plan with patient and/or surrogate as well as nursing, discussions with consultants, evaluation of patient's response to treatment, examination of patient, obtaining history from patient or surrogate, ordering and performing treatments and interventions, ordering and review of laboratory studies, ordering and review of radiographic studies, pulse oximetry and re-evaluation of patient's condition.  Glori Bickers, MD  8:37 AM

## 2019-05-05 NOTE — Plan of Care (Signed)
  Problem: Education: Goal: Knowledge of General Education information will improve Description: Including pain rating scale, medication(s)/side effects and non-pharmacologic comfort measures Outcome: Progressing   Problem: Health Behavior/Discharge Planning: Goal: Ability to manage health-related needs will improve Outcome: Progressing   Problem: Clinical Measurements: Goal: Ability to maintain clinical measurements within normal limits will improve Outcome: Progressing Goal: Will remain free from infection Outcome: Progressing Goal: Diagnostic test results will improve Outcome: Progressing Goal: Respiratory complications will improve Outcome: Progressing Goal: Cardiovascular complication will be avoided Outcome: Progressing   Problem: Activity: Goal: Risk for activity intolerance will decrease Outcome: Progressing   Problem: Nutrition: Goal: Adequate nutrition will be maintained Outcome: Progressing   Problem: Coping: Goal: Level of anxiety will decrease Outcome: Progressing   Problem: Elimination: Goal: Will not experience complications related to bowel motility Outcome: Progressing Goal: Will not experience complications related to urinary retention Outcome: Progressing   Problem: Pain Managment: Goal: General experience of comfort will improve Outcome: Progressing   Problem: Safety: Goal: Ability to remain free from injury will improve Outcome: Progressing   Problem: Skin Integrity: Goal: Risk for impaired skin integrity will decrease Outcome: Progressing   Problem: Education: Goal: Will demonstrate proper wound care and an understanding of methods to prevent future damage Outcome: Progressing Goal: Knowledge of disease or condition will improve Outcome: Progressing Goal: Knowledge of the prescribed therapeutic regimen will improve Outcome: Progressing Goal: Individualized Educational Video(s) Outcome: Progressing   Problem: Activity: Goal: Risk for  activity intolerance will decrease Outcome: Progressing   Problem: Cardiac: Goal: Will achieve and/or maintain hemodynamic stability Outcome: Progressing   Problem: Clinical Measurements: Goal: Postoperative complications will be avoided or minimized Outcome: Progressing   Problem: Respiratory: Goal: Respiratory status will improve Outcome: Progressing   Problem: Skin Integrity: Goal: Wound healing without signs and symptoms of infection Outcome: Progressing Goal: Risk for impaired skin integrity will decrease Outcome: Progressing   Problem: Urinary Elimination: Goal: Ability to achieve and maintain adequate renal perfusion and functioning will improve Outcome: Progressing   Problem: Education: Goal: Knowledge about tracheostomy care/management will improve Outcome: Progressing   Problem: Activity: Goal: Ability to tolerate increased activity will improve Outcome: Progressing   Problem: Health Behavior/Discharge Planning: Goal: Ability to manage tracheostomy will improve Outcome: Progressing   Problem: Respiratory: Goal: Patent airway maintenance will improve Outcome: Progressing   Problem: Role Relationship: Goal: Ability to communicate will improve Outcome: Progressing

## 2019-05-05 NOTE — Progress Notes (Signed)
Nutrition Follow-up  DOCUMENTATION CODES:   Severe malnutrition in context of chronic illness  INTERVENTION:   Tube Feeding:  -Vital 1.5 @ 45 ml/hr via Cortrak  -60 ml Prostat BID -Continue B complex with vitamin C  Provides: 2020 kcals, 133 grams protein, 825 ml free water. Meets 100% of needs   NUTRITION DIAGNOSIS:   Severe Malnutrition related to chronic illness as evidenced by severe muscle depletion, severe fat depletion.  Ongoing  GOAL:   Patient will meet greater than or equal to 90% of their needs  Addressed via TF  MONITOR:   Vent status, Diet advancement, Labs, Weight trends, TF tolerance  REASON FOR ASSESSMENT:   Consult Poor PO, Assessment of nutrition requirement/status  ASSESSMENT:   64 yo male admitted with acute systeolic CHF, CAD with 3 vessel disease with plan for CABG. PMH includes DM with HgbA1c 9 with neuropathy, HTN  3/30 Cardiac Cath with severe 3-vessel CAD 3/31 ECHO: EF 30-35% 4/01 IABP placed, Intubated 4/02 Impella placed, bilateral CT placed for pleural effusions 4/04 Extubated 4/06 CABG, MV repair, Intubated 4/07 Extubated 4/09 Impella removed, LVAD placed, Cortrak placed, TF initiated 4/10 CRRT initiated 4/12 Re-intubated 4/14 Extubated 4/15 Re-intubated 4/16 Trach, Bronchoscopy   Pt discussed during ICU rounds and with RN.   Remains on pressors. On trach collar this am (toletrated 12 hours yesterday). Able to wear PMV for 25 minutes today. Continues on CRRT- net even. Tolerating current tube feeding. Change to better meet needs.   Admission weight: 68.2 kg  Current weight: 61.9 kg   I/O: +586 ml since 4/5 CRRT: 3,470 ml x 24 hrs   Drips: milrinone, levophed, sodium phosphate, vasopressin Medications: b complex with Vit C, dulcolax, colace, SS novolog, lantus Labs: Phosphorus 2.1 (L) Mg 2.5 (H) LFTs elevated CBG 118-174  Diet Order:   Diet Order    None      EDUCATION NEEDS:   Education needs have been  addressed  Skin:  Skin Assessment: Skin Integrity Issues: Skin Integrity Issues:: Incisions Incisions: R axilla, chest, bilateral legs, neck  Last BM:  4/19  Height:   Ht Readings from Last 1 Encounters:  04/23/2019 5\' 11"  (1.803 m)    Weight:   Wt Readings from Last 1 Encounters:  05/05/19 61.9 kg   BMI:  Body mass index is 19.03 kg/m.  Estimated Nutritional Needs:   Kcal:  1900-2100 kcal  Protein:  120-135 grams  Fluid:  >/= 1.8 L/day   Mariana Single RD, LDN Clinical Nutrition Pager listed in [place]

## 2019-05-05 NOTE — Progress Notes (Signed)
Patient ID: Brendan [name], male   DOB: [birth year], 64 y.o.   MRN: [id number] EVENING ROUNDS NOTE :     [address]       [place] [address]             [phone]                 3 Days Post-Op Procedure(s) (LRB): VIDEO BRONCHOSCOPY USING DISPOSABLE ANESTHESIA SCOPE (N/A) TRACHEOSTOMY (N/A) OPERATIVE REPORT  DATE OF PROCEDURE:  04/29/2019  OPERATIONS: 1.  Coronary artery bypass grafting x3 (left internal mammary artery to left anterior descending, saphenous vein graft to posterior descending, saphenous vein graft to second diagonal). 2.  Mitral valve annuloplasty using a 26 mm Edwards Physio II annuloplasty ring, serial [id number]. 3.  Endoscopic harvest of left leg greater saphenous vein.  SURGEON:  Ivin Poot, MD   Total Length of Stay:  LOS: 21 days  BP (!) 93/45   Pulse (!) 37   Temp (!) 97.2 F (36.2 C)   Resp 12   Ht 5\' 11"  (1.803 m) Comment: measured x 3  Wt 61.9 kg   SpO2 99%   BMI 19.03 kg/m   .Intake/Output      04/18 0701 - 04/19 0700 04/19 0701 - 04/20 0700   I.V. (mL/kg) 785.9 (12.7) 374.7 (6.1)   Blood     Other  370   NG/GT 1470 705   IV Piggyback 500.6 459.9   Total Intake(mL/kg) 2756.5 (44.5) 1909.7 (30.9)   Emesis/NG output 0    Other 3470 1889   Total Output 3470 1889   Net -713.5 +20.7        Emesis Occurrence 1 x      .  prismasol BGK 4/2.5 400 mL/hr at 05/05/19 1603  .  prismasol BGK 4/2.5 200 mL/hr at 05/05/19 0620  . sodium chloride    . ampicillin-sulbactam (UNASYN) IV Stopped (05/05/19 1622)  . epinephrine Stopped (04/28/19 0138)  . feeding supplement (VITAL 1.5 CAL) 1,000 mL (05/05/19 1652)  . fentaNYL infusion INTRAVENOUS Stopped (04/30/19 1211)  . heparin 10,000 units/ 20 mL infusion syringe 1,000 Units/hr (05/05/19 1549)  . lactated ringers Stopped (04/21/2019 1640)  . lactated ringers Stopped (04/30/19 0930)  . milrinone 0.125 mcg/kg/min (05/05/19 1700)  . norepinephrine (LEVOPHED) Adult infusion 14  mcg/min (05/05/19 1700)  . prismasol BGK 4/2.5 1,800 mL/hr at 05/05/19 1455  . vasopressin (PITRESSIN) infusion - *FOR SHOCK* 0.02 Units/min (05/05/19 1700)     Lab Results  Component Value Date   WBC 17.8 (H) 05/05/2019   HGB 8.8 (L) 05/05/2019   HCT 26.0 (L) 05/05/2019   PLT 296 05/05/2019   GLUCOSE 180 (H) 05/05/2019   CHOL 174 08/21/2017   TRIG 143 08/21/2017   HDL 57 08/21/2017   LDLCALC 88 08/21/2017   ALT 255 (H) 05/05/2019   AST 327 (H) 05/05/2019   NA 138 05/05/2019   K 4.6 05/05/2019   CL 102 05/05/2019   CREATININE 1.16 05/05/2019   BUN 22 05/05/2019   CO2 27 05/05/2019   TSH 0.925 03/29/2019   INR 1.6 (H) 05/01/2019   HGBA1C 9.0 (H) 04/13/2019   MICROALBUR 30 08/21/2017   15  Days post op Neuro intact On trach collar now cvvh continues   Grace Isaac MD  Beeper [phone] Office [phone] 05/05/2019 5:29 PM

## 2019-05-06 ENCOUNTER — Inpatient Hospital Stay (HOSPITAL_COMMUNITY): Payer: Medicaid Other

## 2019-05-06 LAB — COMPREHENSIVE METABOLIC PANEL
ALT: 242 U/L — ABNORMAL HIGH (ref 0–44)
AST: 190 U/L — ABNORMAL HIGH (ref 15–41)
Albumin: 1.8 g/dL — ABNORMAL LOW (ref 3.5–5.0)
Alkaline Phosphatase: 621 U/L — ABNORMAL HIGH (ref 38–126)
Anion gap: 9 (ref 5–15)
BUN: 23 mg/dL (ref 8–23)
CO2: 25 mmol/L (ref 22–32)
Calcium: 7.6 mg/dL — ABNORMAL LOW (ref 8.9–10.3)
Chloride: 103 mmol/L (ref 98–111)
Creatinine, Ser: 1.16 mg/dL (ref 0.61–1.24)
GFR calc Af Amer: >60 mL/min (ref >60)
GFR calc non Af Amer: >60 mL/min (ref >60)
Glucose, Bld: 89 mg/dL (ref 70–99)
Potassium: 4.8 mmol/L (ref 3.5–5.1)
Sodium: 137 mmol/L (ref 135–145)
Total Bilirubin: 1.1 mg/dL (ref 0.3–1.2)
Total Protein: 6 g/dL — ABNORMAL LOW (ref 6.5–8.1)

## 2019-05-06 LAB — POCT I-STAT 7, (LYTES, BLD GAS, ICA,H+H)
Acid-Base Excess: 5 mmol/L — ABNORMAL HIGH (ref 0.0–2.0)
Bicarbonate: 28.7 mmol/L — ABNORMAL HIGH (ref 20.0–28.0)
Calcium, Ion: 1.14 mmol/L — ABNORMAL LOW (ref 1.15–1.40)
HCT: 29 % — ABNORMAL LOW (ref 39.0–52.0)
Hemoglobin: 9.9 g/dL — ABNORMAL LOW (ref 13.0–17.0)
O2 Saturation: 96 %
Potassium: 4.9 mmol/L (ref 3.5–5.1)
Sodium: 138 mmol/L (ref 135–145)
TCO2: 30 mmol/L (ref 22–32)
pCO2 arterial: 39.9 mmHg (ref 32.0–48.0)
pH, Arterial: 7.465 — ABNORMAL HIGH (ref 7.350–7.450)
pO2, Arterial: 77 mmHg — ABNORMAL LOW (ref 83.0–108.0)

## 2019-05-06 LAB — TYPE AND SCREEN
ABO/RH(D): O NEG
Antibody Screen: NEGATIVE
Unit division: 0

## 2019-05-06 LAB — RENAL FUNCTION PANEL
Albumin: 1.8 g/dL — ABNORMAL LOW (ref 3.5–5.0)
Anion gap: 6 (ref 5–15)
BUN: 25 mg/dL — ABNORMAL HIGH (ref 8–23)
CO2: 29 mmol/L (ref 22–32)
Calcium: 7.5 mg/dL — ABNORMAL LOW (ref 8.9–10.3)
Chloride: 103 mmol/L (ref 98–111)
Creatinine, Ser: 1.14 mg/dL (ref 0.61–1.24)
GFR calc Af Amer: >60 mL/min (ref >60)
GFR calc non Af Amer: >60 mL/min (ref >60)
Glucose, Bld: 236 mg/dL — ABNORMAL HIGH (ref 70–99)
Phosphorus: 2.4 mg/dL — ABNORMAL LOW (ref 2.5–4.6)
Potassium: 4.7 mmol/L (ref 3.5–5.1)
Sodium: 138 mmol/L (ref 135–145)

## 2019-05-06 LAB — GLUCOSE, CAPILLARY
Glucose-Capillary: 125 mg/dL — ABNORMAL HIGH (ref 70–99)
Glucose-Capillary: 81 mg/dL (ref 70–99)
Glucose-Capillary: 137 mg/dL — ABNORMAL HIGH (ref 70–99)
Glucose-Capillary: 88 mg/dL (ref 70–99)
Glucose-Capillary: 195 mg/dL — ABNORMAL HIGH (ref 70–99)
Glucose-Capillary: 145 mg/dL — ABNORMAL HIGH (ref 70–99)

## 2019-05-06 LAB — COOXEMETRY PANEL
Carboxyhemoglobin: 1.2 % (ref 0.5–1.5)
Methemoglobin: 0.8 % (ref 0.0–1.5)
O2 Saturation: 57.1 %
Total hemoglobin: 9.6 g/dL — ABNORMAL LOW (ref 12.0–16.0)

## 2019-05-06 LAB — CBC
HCT: 29.3 % — ABNORMAL LOW (ref 39.0–52.0)
Hemoglobin: 9.1 g/dL — ABNORMAL LOW (ref 13.0–17.0)
MCH: 28.3 pg (ref 26.0–34.0)
MCHC: 31.1 g/dL (ref 30.0–36.0)
MCV: 91.3 fL (ref 80.0–100.0)
Platelets: 327 10*3/uL (ref 150–400)
RBC: 3.21 MIL/uL — ABNORMAL LOW (ref 4.22–5.81)
RDW: 17.2 % — ABNORMAL HIGH (ref 11.5–15.5)
WBC: 20 10*3/uL — ABNORMAL HIGH (ref 4.0–10.5)
nRBC: 0.2 % (ref 0.0–0.2)

## 2019-05-06 LAB — BPAM RBC
Blood Product Expiration Date: 202105172359
ISSUE DATE / TIME: 202104190801
Unit Type and Rh: 9500

## 2019-05-06 LAB — MAGNESIUM: Magnesium: 2.5 mg/dL — ABNORMAL HIGH (ref 1.7–2.4)

## 2019-05-06 LAB — PHOSPHORUS: Phosphorus: 2.7 mg/dL (ref 2.5–4.6)

## 2019-05-06 MED ORDER — LINEZOLID 600 MG PO TABS
600.0000 mg | ORAL_TABLET | Freq: Two times a day (BID) | ORAL | Status: DC
  Administered 2019-05-06 – 2019-05-09 (×6): 600 mg
  Filled 2019-05-06 (×6): qty 1

## 2019-05-06 MED ORDER — ALBUMIN HUMAN 5 % IV SOLN
12.5000 g | Freq: Once | INTRAVENOUS | Status: AC
  Administered 2019-05-06: 12.5 g via INTRAVENOUS
  Filled 2019-05-06: qty 250

## 2019-05-06 MED ORDER — PIPERACILLIN-TAZOBACTAM 3.375 G IVPB
3.3750 g | Freq: Four times a day (QID) | INTRAVENOUS | Status: AC
  Administered 2019-05-06 – 2019-05-12 (×25): 3.375 g via INTRAVENOUS
  Filled 2019-05-06 (×26): qty 50

## 2019-05-06 MED ORDER — AMIODARONE HCL 200 MG PO TABS
200.0000 mg | ORAL_TABLET | Freq: Every day | ORAL | Status: DC
  Administered 2019-05-07 – 2019-05-11 (×5): 200 mg
  Filled 2019-05-06 (×5): qty 1

## 2019-05-06 MED ORDER — PIPERACILLIN-TAZOBACTAM 3.375 G IVPB: 3.3750 g | Freq: Three times a day (TID) | INTRAVENOUS | Status: DC

## 2019-05-06 MED ORDER — LINEZOLID 600 MG PO TABS
600.0000 mg | ORAL_TABLET | Freq: Two times a day (BID) | ORAL | Status: DC
  Administered 2019-05-06: 600 mg via ORAL
  Filled 2019-05-06 (×4): qty 1

## 2019-05-06 NOTE — Procedures (Signed)
{ Objective Swallowing Evaluation: Type of Study: FEES-Fiberoptic Endoscopic Evaluation of Swallow   Patient Details  Name: Brendan [name] MRN: [id number] Date of Birth: Mar 28, 1955  Today's Date: 05/06/2019 Time: SLP Start Time (ACUTE ONLY): 1002 -SLP Stop Time (ACUTE ONLY): 1040  SLP Time Calculation (min) (ACUTE ONLY): 38 min   Past Medical History:  Past Medical History:  Diagnosis Date  . Cholecystitis 03/2013  . Complication of anesthesia   . Diabetes mellitus without complication ([place])   . Erectile dysfunction 2010  . HTN (hypertension)   . NSTEMI (non-ST elevated myocardial infarction) ([place]) 03/21/2019  . PONV (postoperative nausea and vomiting)    Past Surgical History:  Past Surgical History:  Procedure Laterality Date  . APPENDECTOMY  1969  . BRONCHIAL WASHINGS N/A 05/10/2019   Procedure: Bronchial Washings;  Surgeon: Ivin Poot, MD;  Location: [facility];  Service: Open Heart Surgery;  Laterality: N/A;  . CHOLECYSTECTOMY    . CHOLECYSTECTOMY N/A 04/02/2013   Procedure: LAPAROSCOPIC CHOLECYSTECTOMY WITH  INTRAOPERATIVE CHOLANGIOGRAM;  Surgeon: Joyice Faster. Cornett, MD;  Location: [facility];  Service: General;  Laterality: N/A;  . CORONARY ARTERY BYPASS GRAFT N/A 05/11/2019   Procedure: CORONARY ARTERY BYPASS GRAFTING (CABG) x3, USING LEFT INTERNAL MAMMARY ARTERY AND LEFT LEG GREATER SAPHENOUS VEIN HARVESTED ENDOSCOPICALLY;  Surgeon: Ivin Poot, MD;  Location: [facility] [facility];  Service: Open Heart Surgery;  Laterality: N/A;  Inhaled Nitric-Oxide  . ERCP N/A 04/02/2013   Procedure: ENDOSCOPIC RETROGRADE CHOLANGIOPANCREATOGRAPHY (ERCP);  Surgeon: Inda Castle, MD;  Location: [place];  Service: Endoscopy;  Laterality: N/A;  . 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   . IABP INSERTION N/A 05/11/2019   Procedure: IABP INSERTION;  Surgeon: Jolaine Artist, MD;  Location: [facility];  Service: Cardiovascular;  Laterality: N/A;   . MITRAL VALVE REPLACEMENT N/A 05/12/2019   Procedure: MITRAL VALVE (MV) REPAIR USING PHYSIO II RING SIZE 26MM;  Surgeon: Prescott Gum, Collier Salina, MD;  Location: [facility];  Service: Open Heart Surgery;  Laterality: N/A;  . PLACEMENT OF IMPELLA LEFT VENTRICULAR ASSIST DEVICE Right 05/12/2019   Procedure: Placement of Impella 5.5 Direct;  Surgeon: Ivin Poot, MD;  Location: [facility];  Service: Open Heart Surgery;  Laterality: Right;  . REMOVAL OF IMPELLA LEFT VENTRICULAR ASSIST DEVICE N/A 04/26/2019   Procedure: REMOVAL OF IMPELLA 5.5 direct, LEFT VENTRICULAR ASSIST [place], TEE;  Surgeon: Ivin Poot, MD;  Location: [facility];  Service: Open Heart Surgery;  Laterality: N/A;  . RIGHT HEART CATH N/A 04/20/2019   Procedure: RIGHT HEART CATH;  Surgeon: Jolaine Artist, MD;  Location: [facility];  Service: Cardiovascular;  Laterality: N/A;  . RIGHT/LEFT HEART CATH AND CORONARY ANGIOGRAPHY N/A 03/22/2019   Procedure: RIGHT/LEFT HEART CATH AND CORONARY ANGIOGRAPHY;  Surgeon: Martinique, Peter M, MD;  Location: [facility];  Service: Cardiovascular;  Laterality: N/A;  . TEE WITHOUT CARDIOVERSION N/A 04/27/2019   Procedure: TRANSESOPHAGEAL ECHOCARDIOGRAM (TEE);  Surgeon: Prescott Gum, Collier Salina, MD;  Location: [facility];  Service: Open Heart Surgery;  Laterality: N/A;  . TEE WITHOUT CARDIOVERSION N/A 05/09/2019   Procedure: TRANSESOPHAGEAL ECHOCARDIOGRAM (TEE);  Surgeon: Prescott Gum, Collier Salina, MD;  Location: [facility];  Service: Open Heart Surgery;  Laterality: N/A;  . TRACHEOSTOMY TUBE PLACEMENT N/A 04/26/2019   Procedure: TRACHEOSTOMY;  Surgeon: Ivin Poot, MD;  Location: [facility];  Service: Thoracic;  Laterality: N/A;  . VIDEO BRONCHOSCOPY  N/A 05/15/2019   Procedure: VIDEO BRONCHOSCOPY USING DISPOSABLE ANESTHESIA SCOPE;  Surgeon: Prescott Gum, Collier Salina, MD;  Location: [facility];  Service: Thoracic;  Laterality: N/A;   HPI: 58 yoM originally presented with SOB and fatigue at [facility] found to have new HFrEF, NSTEMI, and  bilateral pleural effusions transferred to [facility] on 3/29 for further cardiac evaluation. 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 Started on lasix and milrinone gtts however developed NSVT. Was taken 4/1 for placement of IABP and swan for optimization prior to CABG. Was on precedex for agitation/ confusion, some concern for DTs. On the evening of 4/1, patient developed worsening respiratory distress and hypoxia, PCCM consulted for intubation and vent management. He was successfully extubated on 4/4 and PCCM signed off. Impella was removed 4/11 and patient noted to have increased WOB throughout the day he was placed on Bipap, but mental status continued to worsen requiring reintubation. Found pulseless 4/14 VT requiring 1 minute of CPR and epinephrine to achieve ROSC.  Eventual trach placement 4/16.    No data recorded   Assessment / Plan / Recommendation  CHL IP CLINICAL IMPRESSIONS 05/06/2019  Clinical Impression Patient presents with a severe oropharyngeal dysphagia with weak laryngeal and pharyngeal musculature and decreased glottal closure resulting in silent aspiration of all boluses provided (ice chips, pureed solids) despite use of chin tuck strategy to attempt to aid in improved laryngeal closure and pharyngeal clearance. Cueing for throat clearing and cough only mildly successful to clear aspirates and residuals post swallow continue to increase risk of post swallow aspiration. Aspiration risk high with all pos at this time. Discussed with MD. REcommend 3-5 ice chips under full staff supervision with PMV in place and only after thorough oral care. This will aid in hydration of mucosa, facilitate use of swallowing musculature, and increase quality of life for this patient. MD in agreement. Education complete with nurse, patient, and spouse. Will f/u.   SLP Visit Diagnosis Aphonia (R49.1);Dysphagia, oropharyngeal phase (R13.12)  Attention and concentration deficit following --  Frontal lobe and  executive function deficit following --  Impact on safety and function Severe aspiration risk      CHL IP TREATMENT RECOMMENDATION 05/06/2019  Treatment Recommendations Therapy as outlined in treatment plan below     Prognosis 05/06/2019  Prognosis for Safe Diet Advancement Good  Barriers to Reach Goals Severity of deficits  Barriers/Prognosis Comment --    CHL IP DIET RECOMMENDATION 05/06/2019  SLP Diet Recommendations NPO;Ice chips PRN after oral care  Liquid Administration via --  Medication Administration Via alternative means  Compensations --  Postural Changes --      CHL IP OTHER RECOMMENDATIONS 05/06/2019  Recommended Consults --  Oral Care Recommendations Oral care QID  Other Recommendations Have oral suction available      CHL IP FOLLOW UP RECOMMENDATIONS 05/06/2019  Follow up Recommendations Skilled Nursing facility      [facility] IP FREQUENCY AND DURATION 05/06/2019  Speech Therapy Frequency (ACUTE ONLY) min 3x week  Treatment Duration 2 weeks           CHL IP ORAL PHASE 05/06/2019  Oral Phase WFL  Oral - Pudding Teaspoon --  Oral - Pudding Cup --  Oral - Honey Teaspoon --  Oral - Honey Cup --  Oral - Nectar Teaspoon --  Oral - Nectar Cup --  Oral - Nectar Straw --  Oral - Thin Teaspoon --  Oral - Thin Cup --  Oral - Thin Straw --  Oral - Puree --  Oral - Mech Soft --  Oral - Regular --  Oral - Multi-Consistency --  Oral - Pill --  Oral Phase - Comment --    CHL IP PHARYNGEAL PHASE 05/06/2019  Pharyngeal Phase Impaired  Pharyngeal- Pudding Teaspoon --  Pharyngeal --  Pharyngeal- Pudding Cup --  Pharyngeal --  Pharyngeal- Honey Teaspoon --  Pharyngeal --  Pharyngeal- Honey Cup --  Pharyngeal --  Pharyngeal- Nectar Teaspoon --  Pharyngeal --  Pharyngeal- Nectar Cup --  Pharyngeal --  Pharyngeal- Nectar Straw --  Pharyngeal --  Pharyngeal- Thin Teaspoon Reduced airway/laryngeal closure;Reduced pharyngeal peristalsis;Reduced epiglottic  inversion;Reduced anterior laryngeal mobility;Reduced laryngeal elevation;Penetration/Aspiration during swallow;Moderate aspiration;Pharyngeal residue - pyriform;Pharyngeal residue - posterior pharnyx  Pharyngeal Material enters airway, passes BELOW cords without attempt by patient to eject out (silent aspiration)  Pharyngeal- Thin Cup --  Pharyngeal --  Pharyngeal- Thin Straw --  Pharyngeal --  Pharyngeal- Puree Reduced airway/laryngeal closure;Reduced pharyngeal peristalsis;Reduced epiglottic inversion;Reduced anterior laryngeal mobility;Reduced laryngeal elevation;Penetration/Aspiration during swallow;Moderate aspiration;Pharyngeal residue - pyriform;Pharyngeal residue - cp segment  Pharyngeal Material enters airway, passes BELOW cords without attempt by patient to eject out (silent aspiration)  Pharyngeal- Mechanical Soft --  Pharyngeal --  Pharyngeal- Regular --  Pharyngeal --  Pharyngeal- Multi-consistency --  Pharyngeal --  Pharyngeal- Pill --  Pharyngeal --  Pharyngeal Comment --     Gabriel Rainwater MA, CCC-SLP    Fantasia Jinkins Meryl 05/06/2019, 10:54 AM

## 2019-05-06 NOTE — Progress Notes (Addendum)
Pharmacy Antibiotic Note  Brendan [name] is a 64 y.o. male s/p CABG/MVR 4/6, on CRRT.  Pharmacy had been consulted for Unasyn dosing after VT arrest with aspiration event following Enterococcus found in BAL culture. Now with rising WBC and concern for clinical response to Unasyn. Pharmacy has been consulted to transition off Unasyn and dose Zosyn and vancomycin. After discussion with MD, vancomycin will be switched to linezolid due to a shortage in the lab to collect vancomycin levels to confirm appropriate dosing.  Plan: Linezolid 600 mg q12h  Zosyn 3.375 g IV q6h while on CRRT Monitor CRRT tolerance/interruptions, clinical progress, LOT, repeat cultures   Height: _0  (180.3 cm)(measured x 3) Weight: 61.6 kg (135 lb 12.9 oz) IBW/kg (Calculated) : 75.3  Temp (24hrs), Avg:97 F (36.1 C), Min:96.3 F (35.7 C), Max:98.5 F (36.9 C)  Recent Labs  Lab 04/24/2019 1526 05/03/2019 1526 05/03/19 0317 05/03/19 1650 05/04/19 0303 05/04/19 1530 05/05/19 0332 05/05/19 1606 05/06/19 0314  WBC 21.1*  --  20.0*  --  20.8*  --  17.8*  --  20.0*  CREATININE 1.49*   < > 1.54*   < > 1.24  1.21 1.13 1.16  1.17 1.16 1.16   < > = values in this interval not displayed.    Estimated Creatinine Clearance: 58.3 mL/min (by C-G formula based on SCr of 1.16 mg/dL).    Allergies  Allergen Reactions  . Acetaminophen Itching   Cefepime 4/4>4/14 Vanc 4/4>4/4; resume 4/12 > 4/15 Rocephin 4/4 > 4/6 Ampicillin 4/12>4/12 Unasyn 4/16>4/20 Linezolid 4/20> Zosyn 4/20>  4/2 R pleural fluid > neg 4/4 BCx x 2 > neg 4/9 BCx - negative 4/9 Wound Cx > rare enterococcus faecalis (pan-S) 4/9 UCx >  > 100K yeast 4/19 BAL - rare E.faecalis (pan sensitive) 4/12 TA > ngF 4/16 BAL > ngF  Vertis Kelch, PharmD, [facility] PGY2 Cardiology Pharmacy Resident Phone [phone] 05/06/2019       9:33 AM  Please check [email or web address] for unit-specific pharmacist phone numbers

## 2019-05-06 NOTE — Progress Notes (Signed)
Patient taken off of ventilator and placed on 35% ATC.  Currently tolerating well.  Will continue to monitor.

## 2019-05-06 NOTE — Progress Notes (Addendum)
EVENING ROUNDS NOTE :     [address]       [place] [address]             [phone]                 4 Days Post-Op Procedure(s) (LRB): VIDEO BRONCHOSCOPY USING DISPOSABLE ANESTHESIA SCOPE (N/A) TRACHEOSTOMY (N/A)  Total Length of Stay:  LOS: 22 days  BP (!) 101/52   Pulse 69   Temp (!) 97.5 F (36.4 C) (Oral)   Resp 16   Ht 5\' 11"  (1.803 m) Comment: measured x 3  Wt 61.6 kg   SpO2 93%   BMI 18.94 kg/m   .Intake/Output      04/20 0701 - 04/21 0700   I.V. (mL/kg) 292.4 (4.7)   Other    NG/GT 570   IV Piggyback 200.1   Total Intake(mL/kg) 1062.5 (17.2)   Emesis/NG output 0   Other 1140   Stool 200   Total Output 1340   Net -277.5       Stool Occurrence 402 x     .  prismasol BGK 4/2.5 400 mL/hr at 05/05/19 1603  .  prismasol BGK 4/2.5 200 mL/hr at 05/06/19 0816  . sodium chloride 10 mL/hr at 05/06/19 0600  . epinephrine Stopped (04/28/19 0138)  . feeding supplement (VITAL 1.5 CAL) 1,000 mL (05/06/19 1723)  . fentaNYL infusion INTRAVENOUS Stopped (04/30/19 1211)  . heparin 10,000 units/ 20 mL infusion syringe 1,000 Units/hr (05/06/19 1311)  . lactated ringers Stopped (04/19/2019 1640)  . lactated ringers Stopped (04/30/19 0930)  . milrinone 0.125 mcg/kg/min (05/06/19 1900)  . norepinephrine (LEVOPHED) Adult infusion 15 mcg/min (05/06/19 1900)  . piperacillin-tazobactam Stopped (05/06/19 1750)  . prismasol BGK 4/2.5 1,800 mL/hr at 05/06/19 0816  . vasopressin (PITRESSIN) infusion - *FOR SHOCK* 0.02 Units/min (05/06/19 1900)     Lab Results  Component Value Date   WBC 20.0 (H) 05/06/2019   HGB 9.9 (L) 05/06/2019   HCT 29.0 (L) 05/06/2019   PLT 327 05/06/2019   GLUCOSE 236 (H) 05/06/2019   CHOL 174 08/21/2017   TRIG 143 08/21/2017   HDL 57 08/21/2017   LDLCALC 88 08/21/2017   ALT 242 (H) 05/06/2019   AST 190 (H) 05/06/2019   NA 138 05/06/2019   K 4.7 05/06/2019   CL 103 05/06/2019   CREATININE 1.14 05/06/2019   BUN 25 (H)  05/06/2019   CO2 29 05/06/2019   TSH 0.925 03/29/2019   INR 1.6 (H) 05/01/2019   HGBA1C 9.0 (H) 03/29/2019   MICROALBUR 30 08/21/2017      1 stable hemodynamics on current inotropes/pressors.  2 conts CRRT   John Giovanni PA-C  I have seen and examined the patient and agree with the assessment and plan as outlined.  Rexene Alberts, MD 05/06/2019 10:22 PM

## 2019-05-06 NOTE — Progress Notes (Signed)
Patient ID: Brendan [name], male   DOB: 12/18/1955, 64 y.o.   MRN: [id number]     Advanced Heart Failure Rounding Note  PCP-Cardiologist: No primary care provider on file.   Subjective:    Events - Presented to [facility] with acute HF  EF 20-25% - [facility] - Cath 3/30 with severe 3 vessel disease. EF 25% - Developed PMVT on milrinone -> milrinone stopped -> developed worsening shock -> IABP placed on 4/1 - Clinical deterioration 4/1-> intubated - 4/2 underwent Impella 5.5 placement earlier and bilateral chest tubes with 3L out from each side.  - Extubated 4/4 - 04/27/2019 CABG and MVR - Extubated 4/7 - Back to OR for Impella 5.5 extraction 4/9, extubated.  TEE with EF 35%, RV ok, trivial MR s/p MV repair.  - CVVH started 4/10 with oliguria, rising creatinine and CVP.  - Deteriorating respiratory status, intubated again early am 4/12.  Enterococcus faecalis in sputum.  - Extubated 4/14.  Developed respiratory distress and started on Bipap.  Had VT arrest with CPR/epinephrine, intubated again 4/15 early am.  - Tracheostomy 4/16 - Atrial fibrillation early am 4/17  Remains on TC. Was on TC all day yesterday. Rested on vent overnight.   On NE 17 -> 15, vasopressin 0.03, milrinone 0.125. Co-ox 57%. MAPs 70s  Remains on CVVHD keeping even. CVP 7. Weight below baseline  Remains on unasyn. Afebrile  C/o back pain this. Wants to drink. FEES pending  Echo (4/10): EF 25-30%, normal RV, no significant MR.  I do not think there is an apical thrombus (think trabeculation).    Objective:   Weight Range: 61.6 kg Body mass index is 18.94 kg/m.   Vital Signs:   Temp:  [96.3 F (35.7 C)-98.5 F (36.9 C)] 97.7 F (36.5 C) (04/20 0756) Pulse Rate:  [26-122] 79 (04/20 0700) Resp:  [11-30] 17 (04/20 0700) BP: (78-112)/(34-74) 90/59 (04/20 0700) SpO2:  [96 %-100 %] 100 % (04/20 0700) FiO2 (%):  [35 %-40 %] 40 % (04/20 0400) Weight:  [61.6 kg] 61.6 kg (04/20 0400) Last BM Date:  05/05/19  Weight change: Filed Weights   05/04/19 0256 05/05/19 0143 05/06/19 0400  Weight: 65.8 kg 61.9 kg 61.6 kg    Intake/Output:   Intake/Output Summary (Last 24 hours) at 05/06/2019 0759 Last data filed at 05/06/2019 0700 Gross per 24 hour  Intake 2970.99 ml  Output 3457 ml  Net -486.01 ml      Physical Exam   CVP 7-8 General:  Thin chronically ill appearing Sitting up in bed. On TC HEENT: normal + [place] Neck: supple. no JVD. + trach collar Carotids 2+ bilat; no bruits. No lymphadenopathy or thryomegaly appreciated. + HD cath Cor: Incisions ok  PMI nondisplaced. Regular rate & rhythm. No rubs, gallops or murmurs. CTs ok.  Lungs: clear Abdomen: soft, nontender, nondistended. No hepatosplenomegaly. No bruits or masses. Good bowel sounds. Extremities: no cyanosis, clubbing, rash, tr edema Neuro: alert & orientedx3, cranial nerves grossly intact. moves all 4 extremities w/o difficulty. Affect pleasant   Telemetry   NSR 70-80s with PAC/PVCs.  Personally reviewed   Labs    CBC Recent Labs    05/05/19 0332 05/05/19 0441 05/06/19 0314 05/06/19 0421  WBC 17.8*  --  20.0*  --   HGB 7.7*   < > 9.1* 9.9*  HCT 24.2*   < > 29.3* 29.0*  MCV 89.0  --  91.3  --   PLT 296  --  327  --    < > =  values in this interval not displayed.   Basic Metabolic Panel Recent Labs    05/05/19 0332 05/05/19 0441 05/05/19 1606 05/05/19 1606 05/06/19 0314 05/06/19 0421  NA 137  138   < > 138   < > 137 138  K 4.5  4.4   < > 4.6   < > 4.8 4.9  CL 104  103   < > 102  --  103  --   CO2 25  26   < > 27  --  25  --   GLUCOSE 139*  133*   < > 180*  --  89  --   BUN 21  21   < > 22  --  23  --   CREATININE 1.16  1.17   < > 1.16  --  1.16  --   CALCIUM 7.3*  7.4*   < > 7.4*  --  7.6*  --   MG 2.5*  --   --   --  2.5*  --   PHOS 2.1*   < > 3.8  --  2.7  --    < > = values in this interval not displayed.   Liver Function Tests Recent Labs    05/05/19 0332 05/05/19 0332  05/05/19 1606 05/06/19 0314  AST 327*  --   --  190*  ALT 255*  --   --  242*  ALKPHOS 596*  --   --  621*  BILITOT 0.8  --   --  1.1  PROT 5.3*  --   --  6.0*  ALBUMIN 1.8*  1.7*   < > 1.8* 1.8*   < > = values in this interval not displayed.   No results for input(s): LIPASE, AMYLASE in the last 72 hours. Cardiac Enzymes No results for input(s): CKTOTAL, CKMB, CKMBINDEX, TROPONINI in the last 72 hours.  BNP: BNP (last 3 results) Recent Labs    03/29/2019 0113  BNP 655.7*    ProBNP (last 3 results) No results for input(s): PROBNP in the last 8760 hours.   D-Dimer No results for input(s): DDIMER in the last 72 hours. Hemoglobin A1C No results for input(s): HGBA1C in the last 72 hours. Fasting Lipid Panel No results for input(s): CHOL, HDL, LDLCALC, TRIG, CHOLHDL, LDLDIRECT in the last 72 hours. Thyroid Function Tests No results for input(s): TSH, T4TOTAL, T3FREE, THYROIDAB in the last 72 hours.  Invalid input(s): FREET3  Other results:   Imaging    No results found.   Medications:     Scheduled Medications: . amiodarone  200 mg Oral Daily  . aspirin EC  325 mg Oral Daily   Or  . aspirin  324 mg Per Tube Daily  . B-complex with vitamin C  1 tablet Per Tube Daily  . bisacodyl  10 mg Oral Daily   Or  . bisacodyl  10 mg Rectal Daily  . chlorhexidine gluconate (MEDLINE KIT)  15 mL Mouth Rinse BID  . Chlorhexidine Gluconate Cloth  6 each Topical Daily  . docusate  200 mg Per Tube Daily  . feeding supplement (PRO-STAT SUGAR FREE 64)  60 mL Per Tube BID  . gabapentin  300 mg Per Tube Q8H  . insulin aspart  0-24 Units Subcutaneous Q4H  . insulin aspart  4 Units Subcutaneous Q4H  . insulin glargine  10 Units Subcutaneous BID  . mouth rinse  15 mL Mouth Rinse 10 times per day  . midodrine  15 mg Per  Tube TID WC  . pantoprazole sodium  40 mg Per Tube Daily  . pneumococcal 23 valent vaccine  0.5 mL Intramuscular Tomorrow-1000  . sodium chloride flush  10-40 mL  Intracatheter Q12H    Infusions: .  prismasol BGK 4/2.5 400 mL/hr at 05/05/19 1603  .  prismasol BGK 4/2.5 200 mL/hr at 05/05/19 0620  . sodium chloride 10 mL/hr at 05/06/19 0600  . ampicillin-sulbactam (UNASYN) IV 3 g (05/06/19 0755)  . epinephrine Stopped (04/28/19 0138)  . feeding supplement (VITAL 1.5 CAL) 1,000 mL (05/05/19 1652)  . fentaNYL infusion INTRAVENOUS Stopped (04/30/19 1211)  . heparin 10,000 units/ 20 mL infusion syringe 1,000 Units/hr (05/06/19 0106)  . lactated ringers Stopped (05/13/2019 1640)  . lactated ringers Stopped (04/30/19 0930)  . milrinone 0.125 mcg/kg/min (05/06/19 0700)  . norepinephrine (LEVOPHED) Adult infusion 15 mcg/min (05/06/19 0700)  . prismasol BGK 4/2.5 1,800 mL/hr at 05/06/19 0229  . vasopressin (PITRESSIN) infusion - *FOR SHOCK* 0.02 Units/min (05/06/19 0700)    PRN Medications: Place/Maintain arterial line **AND** sodium chloride, alteplase, fentaNYL, fentaNYL (SUBLIMAZE) injection, heparin, levalbuterol, metoprolol tartrate, ondansetron (ZOFRAN) IV, ondansetron (ZOFRAN) IV, sodium chloride, sodium chloride flush     Assessment/Plan    1. Acute systolic HF ->  Cardiogenic Shock  - Due to iCM. EF 20-25% - Impella 5.5 placed on 4/2.   - s/p CABG/MVRepair on 4/6  - Impella out 4/9.  - Echo 4/10 with EF 25-30%, normal RV, no MR.   - Now on CVVH. Volume status looks good. Keeping even. Can likely hold soon.  - Co-ox 57% on norepinephrine 15 + vasopressin 0.03 + milrinone 0.125. On midodrine 15 tid. Wean NE and VP as tolerated.  2. CAD - LHC with severe 3 vessel CAD  - No s/s ischemia - s/p CABG x 3 MV Repair 4/6  - on ASA/Crestor  3. Acute hypoxic respiratory failure - Due to pulmonary edema and PNA - Extubated 4/9 re-intubated on 4/12. Failed re-extubation on 4/14. Now s/p tracheostomy - CVP 7-8. Continue CVVH.  - Continue Unasyn for facaelis PNA - Doing well on TC trials.  - CXR stable - CCM following.   4. Mitral  Regurgitation - Mod-severe on ECHO - s/p MV repair, TEE 4/9 with minimal MR s/p repair.   - Echo 4/10 with no significant MR.   5. Uncontrolled DM - Hgb A1C 9.  - On insulin and sliding scale.  - No change  6. AKI - due to shock - Became anuric and now on CVVH to control volume overload.   - Remains anuric post-op. Hopefully wil get some recovery Renal following.  - Keep even for nw  7. Acute blood loss Anemia  - HGb 9.1 today  8. Polymorphic VT - Recurrent VT during respiratory distress 4/14, - Now on po amio - no further VT on tele - Keep K> 4.0 Mg > 2.0   9. 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 No sensation R and L foot and occasionaly in his hands. Requires assistance with ADLs.   10. Severe Malnutrition - Prealbumin 8.9  - Nutrition on board - Continue TFs - For FEES today  11. ID - Enterococcus faecalis in sputum => Unasyn - Candida in urine => completed fluconazole course.  - AF. WBC climbing. 17.8 -> 20.0. Ned to follow closely. May need to reculture  12. Elevated LFTs - Suspect shock liver - AST/ALT remain stably elevated.  13. Atrial fibrillation. paroxysmal - Now back in NSR. On po  amio per TCTS. Can switch back to IV as needed  14. Debility, severe - will need SNF on d/c  CRITICAL CARE Performed by: Glori Bickers  Total critical care time: 35 minutes  Critical care time was exclusive of separately billable procedures and treating other patients.  Critical care was necessary to treat or prevent imminent or life-threatening deterioration.  Critical care was time spent personally by me (independent of midlevel providers or residents) on the following activities: development of treatment plan with patient and/or surrogate as well as nursing, discussions with consultants, evaluation of patient's response to treatment, examination of patient, obtaining history from patient or surrogate, ordering and performing treatments and  interventions, ordering and review of laboratory studies, ordering and review of radiographic studies, pulse oximetry and re-evaluation of patient's condition.    Glori Bickers MD 05/06/2019 7:59 AM

## 2019-05-06 NOTE — Procedures (Signed)
Admit: 04/07/2019 LOS: 13  24M dialysis dependent AKI; cardiogenic shock status post CABG + MV replacement 4/2  Current CRRT Prescription: Start Date: 04/26/19 Catheter: L [place] Temp HD Cath BFR: 200 Pre Blood Pump: 400 4K DFR: 1800 4K Replacement Rate: 200 4K Goal UF: net even Anticoagulation: 1061m/h hep fixed Clotting: infrequent ~1x/24h   S:  Stable overnight  K 4.9, P 2.7  No UOP  Net even  NE, milrinone, VP gtt  O: 04/19 0701 - 04/20 0700 In: 3011 [I.V.:696.1; NG/GT:1385; IV Piggyback:559.9] Out: 3457 [Stool:200]  Filed Weights   05/04/19 0256 05/05/19 0143 05/06/19 0400  Weight: 65.8 kg 61.9 kg 61.6 kg    Recent Labs  Lab 05/05/19 0332 05/05/19 0441 05/05/19 1606 05/06/19 0314 05/06/19 0421  NA 137  138   < > 138 137 138  K 4.5  4.4   < > 4.6 4.8 4.9  CL 104  103  --  102 103  --   CO2 25  26  --  27 25  --   GLUCOSE 139*  133*  --  180* 89  --   BUN 21  21  --  22 23  --   CREATININE 1.16  1.17  --  1.16 1.16  --   CALCIUM 7.3*  7.4*  --  7.4* 7.6*  --   PHOS 2.1*  --  3.8 2.7  --    < > = values in this interval not displayed.   Recent Labs  Lab 05/04/19 0303 05/04/19 0422 05/05/19 0332 05/05/19 0332 05/05/19 0441 05/06/19 0314 05/06/19 0421  WBC 20.8*  --  17.8*  --   --  20.0*  --   HGB 8.0*   < > 7.7*   < > 8.8* 9.1* 9.9*  HCT 24.8*   < > 24.2*   < > 26.0* 29.3* 29.0*  MCV 88.3  --  89.0  --   --  91.3  --   PLT 266  --  296  --   --  327  --    < > = values in this interval not displayed.    Scheduled Meds: . amiodarone  200 mg Oral Daily  . aspirin EC  325 mg Oral Daily   Or  . aspirin  324 mg Per Tube Daily  . B-complex with vitamin C  1 tablet Per Tube Daily  . bisacodyl  10 mg Oral Daily   Or  . bisacodyl  10 mg Rectal Daily  . chlorhexidine gluconate (MEDLINE KIT)  15 mL Mouth Rinse BID  . Chlorhexidine Gluconate Cloth  6 each Topical Daily  . docusate  200 mg Per Tube Daily  . feeding supplement (PRO-STAT  SUGAR FREE 64)  60 mL Per Tube BID  . gabapentin  300 mg Per Tube Q8H  . insulin aspart  0-24 Units Subcutaneous Q4H  . insulin aspart  4 Units Subcutaneous Q4H  . insulin glargine  10 Units Subcutaneous BID  . mouth rinse  15 mL Mouth Rinse 10 times per day  . midodrine  15 mg Per Tube TID WC  . pantoprazole sodium  40 mg Per Tube Daily  . pneumococcal 23 valent vaccine  0.5 mL Intramuscular Tomorrow-1000  . sodium chloride flush  10-40 mL Intracatheter Q12H   Continuous Infusions: .  prismasol BGK 4/2.5 400 mL/hr at 05/05/19 1603  .  prismasol BGK 4/2.5 200 mL/hr at 05/06/19 0816  . sodium chloride 10 mL/hr at 05/06/19 0600  .  ampicillin-sulbactam (UNASYN) IV 200 mL/hr at 05/06/19 0800  . epinephrine Stopped (04/28/19 0138)  . feeding supplement (VITAL 1.5 CAL) 1,000 mL (05/05/19 1652)  . fentaNYL infusion INTRAVENOUS Stopped (04/30/19 1211)  . heparin 10,000 units/ 20 mL infusion syringe 1,000 Units/hr (05/06/19 0106)  . lactated ringers Stopped (04/26/2019 1640)  . lactated ringers Stopped (04/30/19 0930)  . milrinone 0.125 mcg/kg/min (05/06/19 0800)  . norepinephrine (LEVOPHED) Adult infusion 15 mcg/min (05/06/19 0800)  . prismasol BGK 4/2.5 1,800 mL/hr at 05/06/19 0816  . vasopressin (PITRESSIN) infusion - *FOR SHOCK* 0.02 Units/min (05/06/19 0800)   PRN Meds:.Place/Maintain arterial line **AND** sodium chloride, alteplase, fentaNYL, fentaNYL (SUBLIMAZE) injection, heparin, levalbuterol, metoprolol tartrate, ondansetron (ZOFRAN) IV, ondansetron (ZOFRAN) IV, sodium chloride, sodium chloride flush  ABG    Component Value Date/Time   PHART 7.465 (H) 05/06/2019 0421   PCO2ART 39.9 05/06/2019 0421   PO2ART 77.0 (L) 05/06/2019 0421   HCO3 28.7 (H) 05/06/2019 0421   TCO2 30 05/06/2019 0421   ACIDBASEDEF 3.0 (H) 04/28/2019 0735   O2SAT 96.0 05/06/2019 0421    A/P  1. Dialysis dependent AKI, anuric 2. Hypophosphatemia, from CRRT, req intermittent repletion 3. Enterococcus  faecalis pneumonia; per primary / CCM 4. 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 Cardiogenic shock on inotrope 6. CAD status post CABG 7. Mitral regurgitation status post mitral valve repair 8. diabetes mellitus 2 9. Acute blood loss anemia; Hb stable 8-9s 10. Recurrent polymorphic ventricular tachycardia, on amiodarone  Cont all 4K baths.  Cont fixed dose hep in CRRT circuilt. Can't stop CRRT until off pressors or at least on much lower doses.    Pearson Grippe, MD [facility]

## 2019-05-06 NOTE — Progress Notes (Addendum)
TCTS DAILY ICU PROGRESS NOTE                   [address]            [place] [address]          [phone]   4 Days Post-Op Procedure(s) (LRB): VIDEO BRONCHOSCOPY USING DISPOSABLE ANESTHESIA SCOPE (N/A) TRACHEOSTOMY (N/A)  Total Length of Stay:  LOS: 22 days   Subjective: Onrach collar this am and he is awake and alert  Objective: Vital signs in last 24 hours: Temp:  [96.3 F (35.7 C)-98.5 F (36.9 C)] 98.5 F (36.9 C) (04/20 0330) Pulse Rate:  [26-122] 79 (04/20 0700) Cardiac Rhythm: Normal sinus rhythm (04/20 0400) Resp:  [11-30] 17 (04/20 0700) BP: (78-112)/(34-74) 90/59 (04/20 0700) SpO2:  [96 %-100 %] 100 % (04/20 0700) FiO2 (%):  [35 %-40 %] 40 % (04/20 0400) Weight:  [61.6 kg] 61.6 kg (04/20 0400)  Filed Weights   05/04/19 0256 05/05/19 0143 05/06/19 0400  Weight: 65.8 kg 61.9 kg 61.6 kg    Weight change: -0.3 kg   Hemodynamic parameters for last 24 hours: CVP:  [9 mmHg-10 mmHg] 9 mmHg  Intake/Output from previous day: 04/19 0701 - 04/20 0700 In: 3011 [I.V.:696.1; NG/GT:1385; IV Piggyback:559.9] Out: 4128 [Stool:200]  Intake/Output this shift: No intake/output data recorded.  Current Meds: Scheduled Meds: . amiodarone  200 mg Oral Daily  . aspirin EC  325 mg Oral Daily   Or  . aspirin  324 mg Per Tube Daily  . B-complex with vitamin C  1 tablet Per Tube Daily  . bisacodyl  10 mg Oral Daily   Or  . bisacodyl  10 mg Rectal Daily  . chlorhexidine gluconate (MEDLINE KIT)  15 mL Mouth Rinse BID  . Chlorhexidine Gluconate Cloth  6 each Topical Daily  . docusate  200 mg Per Tube Daily  . feeding supplement (PRO-STAT SUGAR FREE 64)  60 mL Per Tube BID  . gabapentin  300 mg Per Tube Q8H  . insulin aspart  0-24 Units Subcutaneous Q4H  . insulin aspart  4 Units Subcutaneous Q4H  . insulin glargine  10 Units Subcutaneous BID  . mouth rinse  15 mL Mouth Rinse 10 times per day  . midodrine  15 mg Per Tube TID WC  . pantoprazole sodium   40 mg Per Tube Daily  . pneumococcal 23 valent vaccine  0.5 mL Intramuscular Tomorrow-1000  . sodium chloride flush  10-40 mL Intracatheter Q12H   Continuous Infusions: .  prismasol BGK 4/2.5 400 mL/hr at 05/05/19 1603  .  prismasol BGK 4/2.5 200 mL/hr at 05/05/19 0620  . sodium chloride 10 mL/hr at 05/06/19 0600  . ampicillin-sulbactam (UNASYN) IV Stopped (05/06/19 0030)  . epinephrine Stopped (04/28/19 0138)  . feeding supplement (VITAL 1.5 CAL) 1,000 mL (05/05/19 1652)  . fentaNYL infusion INTRAVENOUS Stopped (04/30/19 1211)  . heparin 10,000 units/ 20 mL infusion syringe 1,000 Units/hr (05/06/19 0106)  . lactated ringers Stopped (04/20/2019 1640)  . lactated ringers Stopped (04/30/19 0930)  . milrinone 0.125 mcg/kg/min (05/06/19 0700)  . norepinephrine (LEVOPHED) Adult infusion 15 mcg/min (05/06/19 0700)  . prismasol BGK 4/2.5 1,800 mL/hr at 05/06/19 0229  . vasopressin (PITRESSIN) infusion - *FOR SHOCK* 0.02 Units/min (05/06/19 0700)   PRN Meds:.Place/Maintain arterial line **AND** sodium chloride, alteplase, fentaNYL, fentaNYL (SUBLIMAZE) injection, heparin, levalbuterol, metoprolol tartrate, ondansetron (ZOFRAN) IV, ondansetron (ZOFRAN) IV, sodium chloride, sodium chloride flush  Heart: RRR Lungs: Slightly diminished  basilar breath sounds but mostly clear Abdomen: Soft, non tender, slightly protuberant, sporadic bowel sounds this am Extremities: [employer], SCDs in place.  Wound: Sternal and right axillary wounds are clean and dry. The most inferior portion of sternal wound has superficial dehiscence with scant sero sanguinous drainage but no sign of infection  Lab Results: CBC: Recent Labs    05/04/19 0303 05/04/19 0422 05/05/19 0332 05/05/19 0332 05/05/19 0441 05/06/19 0421  WBC 20.8*  --  17.8*  --   --   --   HGB 8.0*   < > 7.7*   < > 8.8* 9.9*  HCT 24.8*   < > 24.2*   < > 26.0* 29.0*  PLT 266  --  296  --   --   --    < > = values in this interval not displayed.     BMET:  Recent Labs    05/05/19 1606 05/05/19 1606 05/06/19 0314 05/06/19 0421  NA 138   < > 137 138  K 4.6   < > 4.8 4.9  CL 102  --  103  --   CO2 27  --  25  --   GLUCOSE 180*  --  89  --   BUN 22  --  23  --   CREATININE 1.16  --  1.16  --   CALCIUM 7.4*  --  7.6*  --    < > = values in this interval not displayed.    CMET: Lab Results  Component Value Date   WBC 17.8 (H) 05/05/2019   HGB 9.9 (L) 05/06/2019   HCT 29.0 (L) 05/06/2019   PLT 296 05/05/2019   GLUCOSE 89 05/06/2019   CHOL 174 08/21/2017   TRIG 143 08/21/2017   HDL 57 08/21/2017   LDLCALC 88 08/21/2017   ALT 242 (H) 05/06/2019   AST 190 (H) 05/06/2019   NA 138 05/06/2019   K 4.9 05/06/2019   CL 103 05/06/2019   CREATININE 1.16 05/06/2019   BUN 23 05/06/2019   CO2 25 05/06/2019   TSH 0.925 04/05/2019   INR 1.6 (H) 05/01/2019   HGBA1C 9.0 (H) 03/22/2019   MICROALBUR 30 08/21/2017    PT/INR:  No results for input(s): LABPROT, INR in the last 72 hours. Radiology: No results found.  Assessment/Plan: S/P Procedure(s) (LRB): VIDEO BRONCHOSCOPY USING DISPOSABLE ANESTHESIA SCOPE (N/A) TRACHEOSTOMY (N/A)  1. CV-S/p removal of Impella on 04/09.  S/p V tach arrest 04/15. Previous a fib. SR this am.  On Amiodarone 200 mg daily and Midodrine 15 mg tid. Also, on Milrinone 0.125 mcg/kg/min, and Vasopressin .02 units/min drips. Weaning drips as able.  Co ox this am 57.1 2. Pulmonary-S/p trach 04/16. On trach collar during the day and ventilator at night. ABG results this am noted. CXR appears relatively stable (mild interstitial edema, opacity on right lung). 3. Expected post op blood loss anemia-H and H this am decreased to 9.1 and 29.3 4. DM-CBGs 121/125/81. On Insulin. He was on Insulin and Metformin 1000 mg bid prior to surgery. Continue on Insulin for now as creatinine elevated and NPO. Pre op HGA1C 9. He will need close medical follow up after discharge 5. AKI-Creatinine decreased to 1.16 this am.  Nephrology following and arranging for CVVHD accordingly 6. Acute systolic heart failure-Previously on Lasix drip. CVP 10 this am 7. GI-severe malnutrition of chronic illness.  Cortrak, TFs. Speech pathology evaluation done yesterday 10. Elevated transaminases and are decreasing  (likely related to shock)-AST 190,  ALT 242, ALk phos 621.  Crestor stopped yesterday 11. ID-On Unasyn forEnterococcus Faecalis in sputum . Of note he completed Fluconazole for Candida in urine. Completed Vanco and Cefepime for Enterococcus Faecalis. 12. Extremely deconditioned-will need PT as able  [name] PA-C 05/06/2019 7:36 AM   Rising WBC with bilat pulmonary infiltrate Will broaden antibiotic coverage  patient examined and medical record reviewed,agree with above note. [name] 05/06/2019

## 2019-05-06 NOTE — Progress Notes (Signed)
  Speech Language Pathology Treatment: Nada Boozer Speaking valve  Patient Details Name: Brendan [name] MRN: [id number] DOB: [birth year] Today's Date: 05/06/2019 Time: 1002-1040 SLP Time Calculation (min) (ACUTE ONLY): 38 min  Assessment / Plan / Recommendation Clinical Impression  Cuff deflated without incidence. Patient then able to wear PMV for 40 minutes without change In vital signs (all remaining WFL), evidence of distress, or evidence of air trapping with valve removal. Vocal quality remains hoarse however with improved intensity and overall intelligibility as compared to previous date. Now approximately 60% intelligible at the sentence level. Continued education complete with patient and wife. Given continued good tolerance, recommend allowing patient to utilize with full staff supervision, cuff must be deflated prior to use, as well as during ice chips provided after oral care per FEES recommendations. SLP will continue to f/u.    HPI HPI: 48 yoM originally presented with SOB and fatigue at [facility] found to have new HFrEF, NSTEMI, and bilateral pleural effusions transferred to [facility] on 3/29 for further cardiac evaluation. 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 Started on lasix and milrinone gtts however developed NSVT. Was taken 4/1 for placement of IABP and swan for optimization prior to CABG. Was on precedex for agitation/ confusion, some concern for DTs. On the evening of 4/1, patient developed worsening respiratory distress and hypoxia, PCCM consulted for intubation and vent management. He was successfully extubated on 4/4 and PCCM signed off. Impella was removed 4/11 and patient noted to have increased WOB throughout the day he was placed on Bipap, but mental status continued to worsen requiring reintubation. Found pulseless 4/14 VT requiring 1 minute of CPR and epinephrine to achieve ROSC.  Eventual trach placement 4/16.       SLP Plan  Continue with current plan of care        Recommendations         Patient may use Passy-Muir Speech Valve: Intermittently with supervision(with full staff supervision) PMSV Supervision: Full         Oral Care Recommendations: Oral care QID Follow up Recommendations: Skilled Nursing facility SLP Visit Diagnosis: Aphonia (R49.1) Plan: Continue with current plan of care       GO              Makayla Lanter MA, [place] 05/06/2019, 10:36 AM

## 2019-05-07 ENCOUNTER — Inpatient Hospital Stay (HOSPITAL_COMMUNITY): Payer: Medicaid Other

## 2019-05-07 LAB — POCT I-STAT 7, (LYTES, BLD GAS, ICA,H+H)
Acid-Base Excess: 4 mmol/L — ABNORMAL HIGH (ref 0.0–2.0)
Acid-Base Excess: 3 mmol/L — ABNORMAL HIGH (ref 0.0–2.0)
Acid-Base Excess: 3 mmol/L — ABNORMAL HIGH (ref 0.0–2.0)
Bicarbonate: 28.7 mmol/L — ABNORMAL HIGH (ref 20.0–28.0)
Bicarbonate: 27.5 mmol/L (ref 20.0–28.0)
Bicarbonate: 27.8 mmol/L (ref 20.0–28.0)
Calcium, Ion: 1.19 mmol/L (ref 1.15–1.40)
Calcium, Ion: 1.19 mmol/L (ref 1.15–1.40)
Calcium, Ion: 1.17 mmol/L (ref 1.15–1.40)
HCT: 30 % — ABNORMAL LOW (ref 39.0–52.0)
HCT: 30 % — ABNORMAL LOW (ref 39.0–52.0)
HCT: 30 % — ABNORMAL LOW (ref 39.0–52.0)
Hemoglobin: 10.2 g/dL — ABNORMAL LOW (ref 13.0–17.0)
Hemoglobin: 10.2 g/dL — ABNORMAL LOW (ref 13.0–17.0)
Hemoglobin: 10.2 g/dL — ABNORMAL LOW (ref 13.0–17.0)
O2 Saturation: 84 %
O2 Saturation: 83 %
O2 Saturation: 95 %
Patient temperature: 97.4
Patient temperature: 96.2
Potassium: 4.7 mmol/L (ref 3.5–5.1)
Potassium: 4.9 mmol/L (ref 3.5–5.1)
Potassium: 5.2 mmol/L — ABNORMAL HIGH (ref 3.5–5.1)
Sodium: 138 mmol/L (ref 135–145)
Sodium: 137 mmol/L (ref 135–145)
Sodium: 136 mmol/L (ref 135–145)
TCO2: 30 mmol/L (ref 22–32)
TCO2: 29 mmol/L (ref 22–32)
TCO2: 29 mmol/L (ref 22–32)
pCO2 arterial: 45 mmHg (ref 32.0–48.0)
pCO2 arterial: 41.7 mmHg (ref 32.0–48.0)
pCO2 arterial: 41.3 mmHg (ref 32.0–48.0)
pH, Arterial: 7.413 (ref 7.350–7.450)
pH, Arterial: 7.425 (ref 7.350–7.450)
pH, Arterial: 7.431 (ref 7.350–7.450)
pO2, Arterial: 48 mmHg — ABNORMAL LOW (ref 83.0–108.0)
pO2, Arterial: 45 mmHg — ABNORMAL LOW (ref 83.0–108.0)
pO2, Arterial: 67 mmHg — ABNORMAL LOW (ref 83.0–108.0)

## 2019-05-07 LAB — COMPREHENSIVE METABOLIC PANEL
ALT: 178 U/L — ABNORMAL HIGH (ref 0–44)
AST: 106 U/L — ABNORMAL HIGH (ref 15–41)
Albumin: 1.9 g/dL — ABNORMAL LOW (ref 3.5–5.0)
Alkaline Phosphatase: 544 U/L — ABNORMAL HIGH (ref 38–126)
Anion gap: 5 (ref 5–15)
BUN: 24 mg/dL — ABNORMAL HIGH (ref 8–23)
CO2: 27 mmol/L (ref 22–32)
Calcium: 7.7 mg/dL — ABNORMAL LOW (ref 8.9–10.3)
Chloride: 105 mmol/L (ref 98–111)
Creatinine, Ser: 1.09 mg/dL (ref 0.61–1.24)
GFR calc Af Amer: >60 mL/min (ref >60)
GFR calc non Af Amer: >60 mL/min (ref >60)
Glucose, Bld: 119 mg/dL — ABNORMAL HIGH (ref 70–99)
Potassium: 4.7 mmol/L (ref 3.5–5.1)
Sodium: 137 mmol/L (ref 135–145)
Total Bilirubin: 1.2 mg/dL (ref 0.3–1.2)
Total Protein: 6 g/dL — ABNORMAL LOW (ref 6.5–8.1)

## 2019-05-07 LAB — CBC
HCT: 29.2 % — ABNORMAL LOW (ref 39.0–52.0)
Hemoglobin: 8.9 g/dL — ABNORMAL LOW (ref 13.0–17.0)
MCH: 28.1 pg (ref 26.0–34.0)
MCHC: 30.5 g/dL (ref 30.0–36.0)
MCV: 92.1 fL (ref 80.0–100.0)
Platelets: 319 10*3/uL (ref 150–400)
RBC: 3.17 MIL/uL — ABNORMAL LOW (ref 4.22–5.81)
RDW: 16.9 % — ABNORMAL HIGH (ref 11.5–15.5)
WBC: 21 10*3/uL — ABNORMAL HIGH (ref 4.0–10.5)
nRBC: 0 % (ref 0.0–0.2)

## 2019-05-07 LAB — GLUCOSE, CAPILLARY
Glucose-Capillary: 106 mg/dL — ABNORMAL HIGH (ref 70–99)
Glucose-Capillary: 110 mg/dL — ABNORMAL HIGH (ref 70–99)
Glucose-Capillary: 116 mg/dL — ABNORMAL HIGH (ref 70–99)
Glucose-Capillary: 125 mg/dL — ABNORMAL HIGH (ref 70–99)
Glucose-Capillary: 199 mg/dL — ABNORMAL HIGH (ref 70–99)
Glucose-Capillary: 93 mg/dL (ref 70–99)
Glucose-Capillary: 97 mg/dL (ref 70–99)

## 2019-05-07 LAB — RENAL FUNCTION PANEL
Albumin: 1.8 g/dL — ABNORMAL LOW (ref 3.5–5.0)
Anion gap: 8 (ref 5–15)
BUN: 28 mg/dL — ABNORMAL HIGH (ref 8–23)
CO2: 26 mmol/L (ref 22–32)
Calcium: 7.7 mg/dL — ABNORMAL LOW (ref 8.9–10.3)
Chloride: 103 mmol/L (ref 98–111)
Creatinine, Ser: 1.18 mg/dL (ref 0.61–1.24)
GFR calc Af Amer: >60 mL/min (ref >60)
GFR calc non Af Amer: >60 mL/min (ref >60)
Glucose, Bld: 131 mg/dL — ABNORMAL HIGH (ref 70–99)
Phosphorus: 3 mg/dL (ref 2.5–4.6)
Potassium: 4.7 mmol/L (ref 3.5–5.1)
Sodium: 137 mmol/L (ref 135–145)

## 2019-05-07 LAB — PROCALCITONIN: Procalcitonin: 1.34 ng/mL

## 2019-05-07 LAB — COOXEMETRY PANEL
Carboxyhemoglobin: 1.7 % — ABNORMAL HIGH (ref 0.5–1.5)
Methemoglobin: 1 % (ref 0.0–1.5)
O2 Saturation: 49.2 %
Total hemoglobin: 10.2 g/dL — ABNORMAL LOW (ref 12.0–16.0)

## 2019-05-07 LAB — PHOSPHORUS: Phosphorus: 2.5 mg/dL (ref 2.5–4.6)

## 2019-05-07 LAB — AMYLASE: Amylase: 58 U/L (ref 28–100)

## 2019-05-07 LAB — SEDIMENTATION RATE: Sed Rate: 37 mm/hr — ABNORMAL HIGH (ref 0–16)

## 2019-05-07 LAB — MAGNESIUM: Magnesium: 2.4 mg/dL (ref 1.7–2.4)

## 2019-05-07 MED ORDER — METOCLOPRAMIDE HCL 5 MG/ML IJ SOLN
5.0000 mg | Freq: Three times a day (TID) | INTRAMUSCULAR | Status: DC
  Administered 2019-05-07 – 2019-05-08 (×3): 5 mg via INTRAVENOUS
  Filled 2019-05-07 (×3): qty 2

## 2019-05-07 NOTE — Progress Notes (Signed)
Nutrition Follow-up  DOCUMENTATION CODES:   Severe malnutrition in context of chronic illness  INTERVENTION:   Tube Feeding:  -Vital 1.5 @ 45 ml/hr via Cortrak  -60 ml Prostat BID -Continue B complex with vitamin C  Provides: 2020 kcals, 133 grams protein, 825 ml free water. Meets 100% of needs   Agree with reglan for delayed gastric emptying, continue zofran as needed   NUTRITION DIAGNOSIS:   Severe Malnutrition related to chronic illness as evidenced by severe muscle depletion, severe fat depletion.  Being addressed via TF   GOAL:   Patient will meet greater than or equal to 90% of their needs  Progressing  MONITOR:   Vent status, Diet advancement, Labs, Weight trends, TF tolerance  REASON FOR ASSESSMENT:   Consult Poor PO, Assessment of nutrition requirement/status  ASSESSMENT:   64 yo male admitted with acute systeolic CHF, CAD with 3 vessel disease with plan for CABG. PMH includes DM with HgbA1c 9 with neuropathy, HTN   3/30 Cardiac Cath with severe 3-vessel CAD 3/31 ECHO: EF 30-35% 4/01 IABP placed, Intubated 4/02 Impella placed, bilateral CT placed for pleural effusions 4/04 Extubated 4/06 CABG, MV repair, Intubated 4/07 Extubated 4/09 Impella removed, LVAD placed, Cortrak placed, TF initiated 4/10 CRRT initiated 4/12 Re-intubated 4/14 Extubated 4/15 Re-intubated 4/16 Trach, Bronchoscopy   Pt on and off vent support via trach  Pt remains on CRRT Requiring vasopressin, levophed, milrinone  SLP following, remains NPO  Per RN, pt complained of nausea this AM. No vomiting. Abd xray performed; normal bowel gas pattern. Abd xray with Cortrak tube tip in distal stomach vs proximal small bowel.  +BM, abdomen soft  Agree with anti-nausea medicine and addition of reglan for delayed gastric emptying.   Vital 1.5 at 45 ml/hr via Cortrak, Pro-Stat 60 mL BID  Labs: potassium 5.2, CBGs 116-199 Meds: B-complex with C, ss novolog, novolog q 4 hours,  lantus, reglan, midodrine  Diet Order:   Diet Order    None      EDUCATION NEEDS:   Education needs have been addressed  Skin:  Skin Assessment: Skin Integrity Issues: Skin Integrity Issues:: Incisions Incisions: R axilla, chest, bilateral legs, neck  Last BM:  4/21  Height:   Ht Readings from Last 1 Encounters:  04/29/2019 5\' 11"  (1.803 m)    Weight:   Wt Readings from Last 1 Encounters:  05/07/19 62.4 kg    BMI:  Body mass index is 19.19 kg/m.  Estimated Nutritional Needs:   Kcal:  1900-2100 kcal  Protein:  120-135 grams  Fluid:  >/= 1.8 L/day   Kerman Passey MS, RDN, LDN, CNSC RD Pager Number and Weekend/On-Call After Hours Pager Located in [place]

## 2019-05-07 NOTE — Progress Notes (Signed)
      [address]       [place] [address]             [phone]      S/p CABG, mitral ring  BP (!) 102/58   Pulse 84   Temp (!) 96.2 F (35.7 C)   Resp 18   Ht 5\' 11"  (1.803 m) Comment: measured x 3  Wt 62.4 kg   SpO2 100%   BMI 19.19 kg/m  TC 50%  Intake/Output Summary (Last 24 hours) at 05/07/2019 1817 Last data filed at 05/07/2019 1800 Gross per 24 hour  Intake 1666.5 ml  Output 2431 ml  Net -764.5 ml   Milrinone, vasopressin, norepi  K= 4.7, creatinine 1.18 Hct= 30  Continue current care  Braycen Burandt C. Roxan Hockey, MD [facility] [phone]

## 2019-05-07 NOTE — Procedures (Signed)
Admit: 03/29/2019 LOS: 23  89M dialysis dependent AKI; cardiogenic shock status post CABG + MV replacement 4/2  Current CRRT Prescription: Start Date: 04/26/19 Catheter: L [place] Temp HD Cath BFR: 200 Pre Blood Pump: 400 4K DFR: 1800 4K Replacement Rate: 200 4K Goal UF: 75-100/h net negative Anticoagulation: 1066m/h hep fixed Clotting: infrequent ~1x/24h   S:  Stable overnight  K 4.7, P 2.5  No UOP  NE, milrinone, VP gtt  O: 04/20 0701 - 04/21 0700 In: 1759.6 [I.V.:580.9; NG/GT:885; IV Piggyback:293.7] Out: 2161 [Stool:200]  Filed Weights   05/05/19 0143 05/06/19 0400 05/07/19 0447  Weight: 61.9 kg 61.6 kg 62.4 kg    Recent Labs  Lab 05/06/19 0314 05/06/19 0421 05/06/19 1557 05/07/19 0502 05/07/19 0540  NA 137   < > 138 137 138  K 4.8   < > 4.7 4.7 4.7  CL 103  --  103 105  --   CO2 25  --  29 27  --   GLUCOSE 89  --  236* 119*  --   BUN 23  --  25* 24*  --   CREATININE 1.16  --  1.14 1.09  --   CALCIUM 7.6*  --  7.5* 7.7*  --   PHOS 2.7  --  2.4* 2.5  --    < > = values in this interval not displayed.   Recent Labs  Lab 05/05/19 0332 05/05/19 0441 05/06/19 0314 05/06/19 0314 05/06/19 0421 05/07/19 0502 05/07/19 0540  WBC 17.8*  --  20.0*  --   --  21.0*  --   HGB 7.7*   < > 9.1*   < > 9.9* 8.9* 10.2*  HCT 24.2*   < > 29.3*   < > 29.0* 29.2* 30.0*  MCV 89.0  --  91.3  --   --  92.1  --   PLT 296  --  327  --   --  319  --    < > = values in this interval not displayed.    Scheduled Meds: . amiodarone  200 mg Per Tube Daily  . aspirin EC  325 mg Oral Daily   Or  . aspirin  324 mg Per Tube Daily  . B-complex with vitamin C  1 tablet Per Tube Daily  . bisacodyl  10 mg Oral Daily   Or  . bisacodyl  10 mg Rectal Daily  . chlorhexidine gluconate (MEDLINE KIT)  15 mL Mouth Rinse BID  . Chlorhexidine Gluconate Cloth  6 each Topical Daily  . docusate  200 mg Per Tube Daily  . feeding supplement (PRO-STAT SUGAR FREE 64)  60 mL Per Tube BID  .  gabapentin  300 mg Per Tube Q8H  . insulin aspart  0-24 Units Subcutaneous Q4H  . insulin aspart  4 Units Subcutaneous Q4H  . insulin glargine  10 Units Subcutaneous BID  . linezolid  600 mg Per Tube Q12H  . mouth rinse  15 mL Mouth Rinse 10 times per day  . midodrine  15 mg Per Tube TID WC  . pantoprazole sodium  40 mg Per Tube Daily  . pneumococcal 23 valent vaccine  0.5 mL Intramuscular Tomorrow-1000  . sodium chloride flush  10-40 mL Intracatheter Q12H   Continuous Infusions: .  prismasol BGK 4/2.5 400 mL/hr at 05/07/19 0654  .  prismasol BGK 4/2.5 200 mL/hr at 05/06/19 0816  . sodium chloride 10 mL/hr at 05/06/19 0600  . epinephrine Stopped (04/28/19 0138)  .  feeding supplement (VITAL 1.5 CAL) 1,000 mL (05/06/19 1723)  . fentaNYL infusion INTRAVENOUS Stopped (04/30/19 1211)  . heparin 10,000 units/ 20 mL infusion syringe 1,000 Units/hr (05/07/19 0100)  . lactated ringers Stopped (04/21/2019 1640)  . lactated ringers Stopped (04/30/19 0930)  . milrinone 0.125 mcg/kg/min (05/07/19 0700)  . norepinephrine (LEVOPHED) Adult infusion 14 mcg/min (05/07/19 0700)  . piperacillin-tazobactam 100 mL/hr at 05/07/19 0700  . prismasol BGK 4/2.5 1,800 mL/hr at 05/07/19 0422  . vasopressin (PITRESSIN) infusion - *FOR SHOCK* 0.02 Units/min (05/07/19 0700)   PRN Meds:.Place/Maintain arterial line **AND** sodium chloride, alteplase, fentaNYL, fentaNYL (SUBLIMAZE) injection, heparin, levalbuterol, metoprolol tartrate, ondansetron (ZOFRAN) IV, ondansetron (ZOFRAN) IV, sodium chloride, sodium chloride flush  ABG    Component Value Date/Time   PHART 7.413 05/07/2019 0540   PCO2ART 45.0 05/07/2019 0540   PO2ART 48.0 (L) 05/07/2019 0540   HCO3 28.7 (H) 05/07/2019 0540   TCO2 30 05/07/2019 0540   ACIDBASEDEF 3.0 (H) 04/28/2019 0735   O2SAT 84.0 05/07/2019 0540    A/P  1. Dialysis dependent AKI, anuric 2. Hypophosphatemia, from CRRT, req intermittent repletion if < 2.5 3. Enterococcus faecalis  pneumonia; per primary / CCM 4. 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 Cardiogenic shock on inotrope 6. CAD status post CABG 7. Mitral regurgitation status post mitral valve repair 8. diabetes mellitus 2 9. Acute blood loss anemia; Hb stable 8-9s 10. Recurrent polymorphic ventricular tachycardia, on amiodarone  Cont all 4K baths.  Cont fixed dose hep in CRRT circuilt. Can't stop CRRT until off pressors or at least on much lower doses.    Pearson Grippe, MD [facility]

## 2019-05-07 NOTE — Progress Notes (Signed)
Patient ID: Brendan [name], male   DOB: September 16, 1955, 64 y.o.   MRN: [id number]     Advanced Heart Failure Rounding Note  PCP-Cardiologist: No primary care provider on file.   Subjective:    Events - Presented to [facility] with acute HF  EF 20-25% - Transferred [facility] - Cath 3/30 with severe 3 vessel disease. EF 25% - Developed PMVT on milrinone -> milrinone stopped -> developed worsening shock -> IABP placed on 4/1 - Clinical deterioration 4/1-> intubated - 4/2 underwent Impella 5.5 placement earlier and bilateral chest tubes with 3L out from each side.  - Extubated 4/4 - 04/26/2019 CABG and MVR - Extubated 4/7 - Back to OR for Impella 5.5 extraction 4/9, extubated.  TEE with EF 35%, RV ok, trivial MR s/p MV repair.  - CVVH started 4/10 with oliguria, rising creatinine and CVP.  - Deteriorating respiratory status, intubated again early am 4/12.  Enterococcus faecalis in sputum.  - Extubated 4/14.  Developed respiratory distress and started on Bipap.  Had VT arrest with CPR/epinephrine, intubated again 4/15 early am.  - Tracheostomy 4/16 - Atrial fibrillation early am 4/17  On vent overnight resting. Now back on TC but pO2 in 40s on ABG. With worsening brown trach secretions per RT.   CXR with severe bilateral infiltrates.   On NE 14 vasopressin 0.03 -> 0.02, milrinone 0.125. Co-ox 49%  Remains on CVVHD keeping even. Weight up. CVP 10-11  Unasysn switched to zosyn/linezolid on 4/20. WBC 20k -> 21k  Echo (4/10): EF 25-30%, normal RV, no significant MR.  I do not think there is an apical thrombus (think trabeculation).    Objective:   Weight Range: 62.4 kg Body mass index is 19.19 kg/m.   Vital Signs:   Temp:  [97.4 F (36.3 C)-97.7 F (36.5 C)] 97.4 F (36.3 C) (04/21 0801) Pulse Rate:  [35-113] 69 (04/21 0930) Resp:  [10-28] 15 (04/21 0930) BP: (85-138)/(43-71) 101/62 (04/21 0930) SpO2:  [91 %-100 %] 91 % (04/21 0930) FiO2 (%):  [35 %-50 %] 40 % (04/21 0801) Weight:   [62.4 kg] 62.4 kg (04/21 0447) Last BM Date: 05/05/19  Weight change: Filed Weights   05/05/19 0143 05/06/19 0400 05/07/19 0447  Weight: 61.9 kg 61.6 kg 62.4 kg    Intake/Output:   Intake/Output Summary (Last 24 hours) at 05/07/2019 0948 Last data filed at 05/07/2019 0916 Gross per 24 hour  Intake 1692.24 ml  Output 2215 ml  Net -522.76 ml      Physical Exam   CVP 11 General:  Thin chronically ill appearing Sitting up in bed. On TC HEENT: normal + [place]-tra[place] Neck: supple. + trach with tannish secretions  JVP 10. Carotids 2+ bilat; no bruits. No lymphadenopathy or thryomegaly appreciated. Cor: Incisions ok PMI nondisplaced. Regular rate & rhythm. No rubs, gallops or murmurs. Lungs: coarse Abdomen: soft, nontender, nondistended. No hepatosplenomegaly. No bruits or masses. Good bowel sounds. Extremities: no cyanosis, clubbing, rash, edema Neuro: alert. Follows commands cranial nerves grossly intact. moves all 4 extremities w/o difficulty. Affect pleasant   Telemetry   NSR 60-70s with PAC/PVCs.  Personally reviewed   Labs    CBC Recent Labs    05/06/19 0314 05/06/19 0421 05/07/19 0502 05/07/19 0502 05/07/19 0540 05/07/19 0929  WBC 20.0*  --  21.0*  --   --   --   HGB 9.1*   < > 8.9*   < > 10.2* 10.2*  HCT 29.3*   < > 29.2*   < >  30.0* 30.0*  MCV 91.3  --  92.1  --   --   --   PLT 327  --  319  --   --   --    < > = values in this interval not displayed.   Basic Metabolic Panel Recent Labs    05/06/19 0314 05/06/19 0421 05/06/19 1557 05/06/19 1557 05/07/19 0502 05/07/19 0502 05/07/19 0540 05/07/19 0929  NA 137   < > 138   < > 137   < > 138 137  K 4.8   < > 4.7   < > 4.7   < > 4.7 4.9  CL 103   < > 103  --  105  --   --   --   CO2 25   < > 29  --  27  --   --   --   GLUCOSE 89   < > 236*  --  119*  --   --   --   BUN 23   < > 25*  --  24*  --   --   --   CREATININE 1.16   < > 1.14  --  1.09  --   --   --   CALCIUM 7.6*   < > 7.5*  --  7.7*  --   --   --    MG 2.5*  --   --   --  2.4  --   --   --   PHOS 2.7   < > 2.4*  --  2.5  --   --   --    < > = values in this interval not displayed.   Liver Function Tests Recent Labs    05/06/19 0314 05/06/19 0314 05/06/19 1557 05/07/19 0502  AST 190*  --   --  106*  ALT 242*  --   --  178*  ALKPHOS 621*  --   --  544*  BILITOT 1.1  --   --  1.2  PROT 6.0*  --   --  6.0*  ALBUMIN 1.8*   < > 1.8* 1.9*   < > = values in this interval not displayed.   No results for input(s): LIPASE, AMYLASE in the last 72 hours. Cardiac Enzymes No results for input(s): CKTOTAL, CKMB, CKMBINDEX, TROPONINI in the last 72 hours.  BNP: BNP (last 3 results) Recent Labs    03/17/2019 0113  BNP 655.7*    ProBNP (last 3 results) No results for input(s): PROBNP in the last 8760 hours.   D-Dimer No results for input(s): DDIMER in the last 72 hours. Hemoglobin A1C No results for input(s): HGBA1C in the last 72 hours. Fasting Lipid Panel No results for input(s): CHOL, HDL, LDLCALC, TRIG, CHOLHDL, LDLDIRECT in the last 72 hours. Thyroid Function Tests No results for input(s): TSH, T4TOTAL, T3FREE, THYROIDAB in the last 72 hours.  Invalid input(s): FREET3  Other results:   Imaging    DG Chest Port 1 View  Result Date: 05/07/2019 CLINICAL DATA:  Open heart surgery. EXAM: PORTABLE CHEST 1 VIEW COMPARISON:  05/06/2019 FINDINGS: The tracheostomy tube, feeding tube, right PICC line and left subclavian central venous catheters are stable. Persistent diffuse interstitial and airspace process in the lungs. Slight worsening aeration likely due to lower lung volumes. No definite pleural effusions or pneumothorax. IMPRESSION: 1. Stable support apparatus. 2. Persistent diffuse interstitial and airspace process. Electronically Signed   By: Marijo Sanes M.D.   On: 05/07/2019  08:50     Medications:     Scheduled Medications: . amiodarone  200 mg Per Tube Daily  . aspirin EC  325 mg Oral Daily   Or  . aspirin   324 mg Per Tube Daily  . B-complex with vitamin C  1 tablet Per Tube Daily  . bisacodyl  10 mg Oral Daily   Or  . bisacodyl  10 mg Rectal Daily  . chlorhexidine gluconate (MEDLINE KIT)  15 mL Mouth Rinse BID  . Chlorhexidine Gluconate Cloth  6 each Topical Daily  . docusate  200 mg Per Tube Daily  . feeding supplement (PRO-STAT SUGAR FREE 64)  60 mL Per Tube BID  . gabapentin  300 mg Per Tube Q8H  . insulin aspart  0-24 Units Subcutaneous Q4H  . insulin aspart  4 Units Subcutaneous Q4H  . insulin glargine  10 Units Subcutaneous BID  . linezolid  600 mg Per Tube Q12H  . mouth rinse  15 mL Mouth Rinse 10 times per day  . midodrine  15 mg Per Tube TID WC  . pantoprazole sodium  40 mg Per Tube Daily  . pneumococcal 23 valent vaccine  0.5 mL Intramuscular Tomorrow-1000  . sodium chloride flush  10-40 mL Intracatheter Q12H    Infusions: .  prismasol BGK 4/2.5 400 mL/hr at 05/07/19 0654  .  prismasol BGK 4/2.5 200 mL/hr at 05/06/19 0816  . sodium chloride 10 mL/hr at 05/06/19 0600  . epinephrine Stopped (04/28/19 0138)  . feeding supplement (VITAL 1.5 CAL) 1,000 mL (05/06/19 1723)  . fentaNYL infusion INTRAVENOUS Stopped (04/30/19 1211)  . heparin 10,000 units/ 20 mL infusion syringe 1,000 Units/hr (05/07/19 0916)  . lactated ringers Stopped (04/24/2019 1640)  . lactated ringers Stopped (04/30/19 0930)  . milrinone 0.125 mcg/kg/min (05/07/19 0900)  . norepinephrine (LEVOPHED) Adult infusion 14 mcg/min (05/07/19 0900)  . piperacillin-tazobactam Stopped (05/07/19 0722)  . prismasol BGK 4/2.5 1,800 mL/hr at 05/07/19 0422  . vasopressin (PITRESSIN) infusion - *FOR SHOCK* 0.02 Units/min (05/07/19 0900)    PRN Medications: Place/Maintain arterial line **AND** sodium chloride, alteplase, fentaNYL, fentaNYL (SUBLIMAZE) injection, heparin, levalbuterol, metoprolol tartrate, ondansetron (ZOFRAN) IV, ondansetron (ZOFRAN) IV, sodium chloride, sodium chloride flush     Assessment/Plan    1.  Acute systolic HF ->  Cardiogenic Shock  - Due to iCM. EF 20-25% - Impella 5.5 placed on 4/2.   - s/p CABG/MVRepair on 4/6  - Impella out 4/9.  - Echo 4/10 with EF 25-30%, normal RV, no MR.   - Now on CVVH. Have been keeping even. Weight up. CXR is somewhat worse. Will try to pull 50-75cc/hr - Co-ox low at 49% on norepinephrine 14 + vasopressin 0.02 + milrinone 0.125. On midodrine 15 tid. Will not wean aggressively today. Consider repeat limited echo. Dr. Prescott Gum has paced him today to help with CO  2. CAD - LHC with severe 3 vessel CAD  - No s/s - s/p CABG x 3 MV Repair 4/6  - on ASA/Crestor  3. Acute hypoxic respiratory failure - Due to pulmonary edema and PNA - Extubated 4/9 re-intubated on 4/12. Failed re-extubation on 4/14. Now s/p tracheostomy - CVP 10-11. Continue CVVH.  - Was on Unasyn for facaelis PNA - Was doing well with TC trials. Now with worsening hypoxia and increased secretions. Also likely some edema.  - Abx increased yesterday. Check TF residuals as I suspect he may be aspirating. Check sputum cx and trend PCT. If PCT rising may need  meropenem - increase CVVHD to -50-75 - CCM following.   4. Mitral Regurgitation - Mod-severe on ECHO - s/p MV repair, TEE 4/9 with minimal MR s/p repair.   - Echo 4/10 with no significant MR.   5. Uncontrolled DM - Hgb A1C 9.  - On insulin and sliding scale.  - No change  6. AKI - due to shock - Became anuric and now on CVVH to control volume overload.  Management as above  7. Acute blood loss Anemia  - HGb 8.9 today  8. Polymorphic VT - Recurrent VT during respiratory distress 4/14, - Now on po amio - no further VT on tele - Keep K> 4.0 Mg > 2.0  - no change today  9. 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 No sensation R and L foot and occasionaly in his hands. Requires assistance with ADLs.   10. Severe Malnutrition - Prealbumin 8.9  - Nutrition on board - Holding TFs for now. Check residual    11. ID - Enterococcus faecalis in sputum => Unasyn - Candida in urine => completed fluconazole course.  - AF. WBC climbing. 17.8 -> 20.0. Ned to follow closely. May need to reculture  12. Elevated LFTs - Suspect shock liver - AST/ALT remain stably elevated.  13. Atrial fibrillation. paroxysmal - Now back in NSR. On po amio  14. Debility, severe - will need SNF on d/c  Worse today with worsening respiratory status due to possible aspiration and edema. Remains on high-dose pressors.  Management as above. D/w Dr. Prescott Gum at bedside  CRITICAL CARE Performed by: Glori Bickers  Total critical care time: 45 minutes  Critical care time was exclusive of separately billable procedures and treating other patients.  Critical care was necessary to treat or prevent imminent or life-threatening deterioration.  Critical care was time spent personally by me (independent of midlevel providers or residents) on the following activities: development of treatment plan with patient and/or surrogate as well as nursing, discussions with consultants, evaluation of patient's response to treatment, examination of patient, obtaining history from patient or surrogate, ordering and performing treatments and interventions, ordering and review of laboratory studies, ordering and review of radiographic studies, pulse oximetry and re-evaluation of patient's condition.    Glori Bickers MD 05/07/2019 9:48 AM

## 2019-05-07 NOTE — Progress Notes (Addendum)
[facility] DAILY ICU PROGRESS NOTE                   [address]            [place] [address]          [phone]   5 Days Post-Op Procedure(s) (LRB): VIDEO BRONCHOSCOPY USING DISPOSABLE ANESTHESIA SCOPE (N/A) TRACHEOSTOMY (N/A)  Total Length of Stay:  LOS: 23 days   Subjective: He is to be put on trach collar this am (on vent at night). He has complaints of incisional pain this am.  Objective: Vital signs in last 24 hours: Temp:  [97.5 F (36.4 C)-97.7 F (36.5 C)] 97.7 F (36.5 C) (04/20 2358) Pulse Rate:  [34-113] 72 (04/21 0615) Cardiac Rhythm: Normal sinus rhythm (04/21 0400) Resp:  [10-28] 12 (04/21 0615) BP: (85-138)/(43-71) 100/56 (04/21 0615) SpO2:  [91 %-100 %] 98 % (04/21 0615) FiO2 (%):  [35 %-50 %] 50 % (04/21 0544) Weight:  [62.4 kg] 62.4 kg (04/21 0447)  Filed Weights   05/05/19 0143 05/06/19 0400 05/07/19 0447  Weight: 61.9 kg 61.6 kg 62.4 kg    Weight change: 0.8 kg   Hemodynamic parameters for last 24 hours: CVP:  [9 mmHg] 9 mmHg  Intake/Output from previous day: 04/20 0701 - 04/21 0700 In: 1759.6 [I.V.:580.9; NG/GT:885; IV Piggyback:293.7] Out: 2161 [Stool:200]  Intake/Output this shift: No intake/output data recorded.  Current Meds: Scheduled Meds: . amiodarone  200 mg Per Tube Daily  . aspirin EC  325 mg Oral Daily   Or  . aspirin  324 mg Per Tube Daily  . B-complex with vitamin C  1 tablet Per Tube Daily  . bisacodyl  10 mg Oral Daily   Or  . bisacodyl  10 mg Rectal Daily  . chlorhexidine gluconate (MEDLINE KIT)  15 mL Mouth Rinse BID  . Chlorhexidine Gluconate Cloth  6 each Topical Daily  . docusate  200 mg Per Tube Daily  . feeding supplement (PRO-STAT SUGAR FREE 64)  60 mL Per Tube BID  . gabapentin  300 mg Per Tube Q8H  . insulin aspart  0-24 Units Subcutaneous Q4H  . insulin aspart  4 Units Subcutaneous Q4H  . insulin glargine  10 Units Subcutaneous BID  . linezolid  600 mg Per Tube Q12H  . mouth rinse  15 mL  Mouth Rinse 10 times per day  . midodrine  15 mg Per Tube TID WC  . pantoprazole sodium  40 mg Per Tube Daily  . pneumococcal 23 valent vaccine  0.5 mL Intramuscular Tomorrow-1000  . sodium chloride flush  10-40 mL Intracatheter Q12H   Continuous Infusions: .  prismasol BGK 4/2.5 400 mL/hr at 05/07/19 0654  .  prismasol BGK 4/2.5 200 mL/hr at 05/06/19 0816  . sodium chloride 10 mL/hr at 05/06/19 0600  . epinephrine Stopped (04/28/19 0138)  . feeding supplement (VITAL 1.5 CAL) 1,000 mL (05/06/19 1723)  . fentaNYL infusion INTRAVENOUS Stopped (04/30/19 1211)  . heparin 10,000 units/ 20 mL infusion syringe 1,000 Units/hr (05/07/19 0100)  . lactated ringers Stopped (04/19/2019 1640)  . lactated ringers Stopped (04/30/19 0930)  . milrinone 0.125 mcg/kg/min (05/07/19 0700)  . norepinephrine (LEVOPHED) Adult infusion 14 mcg/min (05/07/19 0700)  . piperacillin-tazobactam 100 mL/hr at 05/07/19 0700  . prismasol BGK 4/2.5 1,800 mL/hr at 05/07/19 0422  . vasopressin (PITRESSIN) infusion - *FOR SHOCK* 0.02 Units/min (05/07/19 0700)   PRN Meds:.Place/Maintain arterial line **AND** sodium chloride, alteplase, fentaNYL, fentaNYL (SUBLIMAZE) injection,  heparin, levalbuterol, metoprolol tartrate, ondansetron (ZOFRAN) IV, ondansetron (ZOFRAN) IV, sodium chloride, sodium chloride flush  Heart: RRR Lungs: Slightly diminished basilar breath sounds but mostly clear Abdomen: Soft, non tender, sporadic bowel sounds this am Extremities: No LE edema but feet have some edema Wound: Sternal and right axillary wounds are clean and dry. The most inferior portion of sternal wound has superficial dehiscence with some sero sanguinous drainage but no sign of infection. He also had bloody like drainage from left chest tube wound.  Lab Results: CBC: Recent Labs    05/06/19 0314 05/06/19 0421 05/07/19 0502 05/07/19 0540  WBC 20.0*  --  21.0*  --   HGB 9.1*   < > 8.9* 10.2*  HCT 29.3*   < > 29.2* 30.0*  PLT 327  --   319  --    < > = values in this interval not displayed.   BMET:  Recent Labs    05/06/19 1557 05/06/19 1557 05/07/19 0502 05/07/19 0540  NA 138   < > 137 138  K 4.7   < > 4.7 4.7  CL 103  --  105  --   CO2 29  --  27  --   GLUCOSE 236*  --  119*  --   BUN 25*  --  24*  --   CREATININE 1.14  --  1.09  --   CALCIUM 7.5*  --  7.7*  --    < > = values in this interval not displayed.    CMET: Lab Results  Component Value Date   WBC 21.0 (H) 05/07/2019   HGB 10.2 (L) 05/07/2019   HCT 30.0 (L) 05/07/2019   PLT 319 05/07/2019   GLUCOSE 119 (H) 05/07/2019   CHOL 174 08/21/2017   TRIG 143 08/21/2017   HDL 57 08/21/2017   LDLCALC 88 08/21/2017   ALT 178 (H) 05/07/2019   AST 106 (H) 05/07/2019   NA 138 05/07/2019   K 4.7 05/07/2019   CL 105 05/07/2019   CREATININE 1.09 05/07/2019   BUN 24 (H) 05/07/2019   CO2 27 05/07/2019   TSH 0.925 04/06/2019   INR 1.6 (H) 05/01/2019   HGBA1C 9.0 (H) 03/21/2019   MICROALBUR 30 08/21/2017    PT/INR:  No results for input(s): LABPROT, INR in the last 72 hours. Radiology: No results found.  Assessment/Plan: S/P Procedure(s) (LRB): VIDEO BRONCHOSCOPY USING DISPOSABLE ANESTHESIA SCOPE (N/A) TRACHEOSTOMY (N/A)  1. CV-S/p removal of Impella on 04/09.  S/p V tach arrest 04/15. Previous a fib. SR this am.  On Amiodarone 200 mg daily and Midodrine 15 mg tid. Also, on Nor epinephrine drip at 14 mcg/min, Milrinone 0.125 mcg/kg/min, and Vasopressin .02 units/min drips. Weaning drips as able.  Co ox this am 57.1 2. Pulmonary-S/p trach 04/16. On trach collar during the day and ventilator at night. ABG results this am noted. CXR appears relatively stable (multifocal opacities right lung). 3. Expected post op blood loss anemia-H and H this am stable at 8.9 and 29.2 4. DM-CBGs 121/125/81. On Insulin. He was on Insulin and Metformin 1000 mg bid prior to surgery. Continue on Insulin for now as creatinine elevated and NPO. Pre op HGA1C 9. He will need  close medical follow up after discharge 5. AKI-Anuric. Creatinine decreased to 1.09 this am. Nephrology following and arranging for CVVHD accordingly 6. GI-severe malnutrition of chronic illness.  Cortrak, TFs. Speech pathology evaluation done yesterday and recommendations to be followed accordingly 7. Elevated transaminases and are decreasing  (  likely related to shock)-AST 106, ALT 178, ALk phos 544.   8. ID-Leukocytosis as WBC 21,000. On Zyvox and Zosyn for broader coverage. Of note, he completed Fluconazole for Candida in urine. Completed Vanco and Cefepime for Enterococcus Faecalis. Tracheal aspirate culture results pending 9. Acute systolic heart failure-CVVHD helping with volume status 10. Extremely deconditioned-will need PT as able  Nani Skillern PA-C 05/07/2019 7:40 AM    Patient with drop in oxygenation with chest x-ray showing new infiltrates.  Patient placed back on ventilator full support with trach Antibiotics have been escalated to vancomycin and Zosyn KUB shows feeding tube postpyloric in good position-continue tube feeds. Maintaining sinus rhythm, temporary wires configured for biventricular pacing to optimize cardiac output Continue amiodarone to maintain sinus rhythm-sed rate only minimally elevated at 37  patient examined and medical record reviewed,agree with above note. [name] 05/07/2019

## 2019-05-08 ENCOUNTER — Inpatient Hospital Stay (HOSPITAL_COMMUNITY): Payer: Medicaid Other

## 2019-05-08 DIAGNOSIS — D72829 Elevated white blood cell count, unspecified: Secondary | ICD-10-CM

## 2019-05-08 DIAGNOSIS — E118 Type 2 diabetes mellitus with unspecified complications: Secondary | ICD-10-CM

## 2019-05-08 DIAGNOSIS — F17220 Nicotine dependence, chewing tobacco, uncomplicated: Secondary | ICD-10-CM

## 2019-05-08 DIAGNOSIS — Z952 Presence of prosthetic heart valve: Secondary | ICD-10-CM

## 2019-05-08 DIAGNOSIS — J9621 Acute and chronic respiratory failure with hypoxia: Secondary | ICD-10-CM

## 2019-05-08 DIAGNOSIS — R7989 Other specified abnormal findings of blood chemistry: Secondary | ICD-10-CM

## 2019-05-08 DIAGNOSIS — Z888 Allergy status to other drugs, medicaments and biological substances status: Secondary | ICD-10-CM

## 2019-05-08 DIAGNOSIS — R509 Fever, unspecified: Secondary | ICD-10-CM

## 2019-05-08 DIAGNOSIS — J158 Pneumonia due to other specified bacteria: Secondary | ICD-10-CM

## 2019-05-08 DIAGNOSIS — B379 Candidiasis, unspecified: Secondary | ICD-10-CM

## 2019-05-08 LAB — CBC
HCT: 28 % — ABNORMAL LOW (ref 39.0–52.0)
Hemoglobin: 8.4 g/dL — ABNORMAL LOW (ref 13.0–17.0)
MCH: 28.1 pg (ref 26.0–34.0)
MCHC: 30 g/dL (ref 30.0–36.0)
MCV: 93.6 fL (ref 80.0–100.0)
Platelets: 318 10*3/uL (ref 150–400)
RBC: 2.99 MIL/uL — ABNORMAL LOW (ref 4.22–5.81)
RDW: 17.1 % — ABNORMAL HIGH (ref 11.5–15.5)
WBC: 18.4 10*3/uL — ABNORMAL HIGH (ref 4.0–10.5)
nRBC: 0 % (ref 0.0–0.2)

## 2019-05-08 LAB — POCT I-STAT 7, (LYTES, BLD GAS, ICA,H+H)
Acid-Base Excess: 3 mmol/L — ABNORMAL HIGH (ref 0.0–2.0)
Bicarbonate: 27.3 mmol/L (ref 20.0–28.0)
Calcium, Ion: 1.15 mmol/L (ref 1.15–1.40)
HCT: 29 % — ABNORMAL LOW (ref 39.0–52.0)
Hemoglobin: 9.9 g/dL — ABNORMAL LOW (ref 13.0–17.0)
O2 Saturation: 92 %
Patient temperature: 96.7
Potassium: 5.1 mmol/L (ref 3.5–5.1)
Sodium: 137 mmol/L (ref 135–145)
TCO2: 29 mmol/L (ref 22–32)
pCO2 arterial: 39.4 mmHg (ref 32.0–48.0)
pH, Arterial: 7.444 (ref 7.350–7.450)
pO2, Arterial: 59 mmHg — ABNORMAL LOW (ref 83.0–108.0)

## 2019-05-08 LAB — RENAL FUNCTION PANEL
Albumin: 1.9 g/dL — ABNORMAL LOW (ref 3.5–5.0)
Anion gap: 10 (ref 5–15)
BUN: 27 mg/dL — ABNORMAL HIGH (ref 8–23)
CO2: 26 mmol/L (ref 22–32)
Calcium: 8.1 mg/dL — ABNORMAL LOW (ref 8.9–10.3)
Chloride: 101 mmol/L (ref 98–111)
Creatinine, Ser: 1.06 mg/dL (ref 0.61–1.24)
GFR calc Af Amer: >60 mL/min (ref >60)
GFR calc non Af Amer: >60 mL/min (ref >60)
Glucose, Bld: 147 mg/dL — ABNORMAL HIGH (ref 70–99)
Phosphorus: 2.8 mg/dL (ref 2.5–4.6)
Potassium: 4.8 mmol/L (ref 3.5–5.1)
Sodium: 137 mmol/L (ref 135–145)

## 2019-05-08 LAB — COMPREHENSIVE METABOLIC PANEL
ALT: 125 U/L — ABNORMAL HIGH (ref 0–44)
AST: 61 U/L — ABNORMAL HIGH (ref 15–41)
Albumin: 1.8 g/dL — ABNORMAL LOW (ref 3.5–5.0)
Alkaline Phosphatase: 547 U/L — ABNORMAL HIGH (ref 38–126)
Anion gap: 8 (ref 5–15)
BUN: 29 mg/dL — ABNORMAL HIGH (ref 8–23)
CO2: 25 mmol/L (ref 22–32)
Calcium: 7.6 mg/dL — ABNORMAL LOW (ref 8.9–10.3)
Chloride: 103 mmol/L (ref 98–111)
Creatinine, Ser: 1.23 mg/dL (ref 0.61–1.24)
GFR calc Af Amer: >60 mL/min (ref >60)
GFR calc non Af Amer: >60 mL/min (ref >60)
Glucose, Bld: 151 mg/dL — ABNORMAL HIGH (ref 70–99)
Potassium: 4.9 mmol/L (ref 3.5–5.1)
Sodium: 136 mmol/L (ref 135–145)
Total Bilirubin: 1.5 mg/dL — ABNORMAL HIGH (ref 0.3–1.2)
Total Protein: 6.1 g/dL — ABNORMAL LOW (ref 6.5–8.1)

## 2019-05-08 LAB — COOXEMETRY PANEL
Carboxyhemoglobin: 1.8 % — ABNORMAL HIGH (ref 0.5–1.5)
Methemoglobin: 1.3 % (ref 0.0–1.5)
O2 Saturation: 53.3 %
Total hemoglobin: 8.8 g/dL — ABNORMAL LOW (ref 12.0–16.0)

## 2019-05-08 LAB — GLUCOSE, CAPILLARY
Glucose-Capillary: 126 mg/dL — ABNORMAL HIGH (ref 70–99)
Glucose-Capillary: 69 mg/dL — ABNORMAL LOW (ref 70–99)
Glucose-Capillary: 117 mg/dL — ABNORMAL HIGH (ref 70–99)
Glucose-Capillary: 119 mg/dL — ABNORMAL HIGH (ref 70–99)
Glucose-Capillary: 116 mg/dL — ABNORMAL HIGH (ref 70–99)
Glucose-Capillary: 145 mg/dL — ABNORMAL HIGH (ref 70–99)

## 2019-05-08 LAB — PROCALCITONIN: Procalcitonin: 1.28 ng/mL

## 2019-05-08 LAB — ECHOCARDIOGRAM LIMITED
Height: 71 in
Weight: 2232.82 oz

## 2019-05-08 LAB — PHOSPHORUS: Phosphorus: 3 mg/dL (ref 2.5–4.6)

## 2019-05-08 LAB — MAGNESIUM: Magnesium: 2.6 mg/dL — ABNORMAL HIGH (ref 1.7–2.4)

## 2019-05-08 MED ORDER — DEXTROSE 50 % IV SOLN
INTRAVENOUS | Status: AC
  Administered 2019-05-08: 25 mL
  Filled 2019-05-08: qty 50

## 2019-05-08 MED ORDER — PRO-STAT SUGAR FREE PO LIQD
60.0000 mL | Freq: Four times a day (QID) | ORAL | Status: DC
  Administered 2019-05-08 – 2019-05-13 (×20): 60 mL via ORAL
  Filled 2019-05-08 (×20): qty 60

## 2019-05-08 MED ORDER — METOCLOPRAMIDE HCL 5 MG/ML IJ SOLN
10.0000 mg | Freq: Three times a day (TID) | INTRAMUSCULAR | Status: DC
  Administered 2019-05-08 – 2019-05-16 (×24): 10 mg via INTRAVENOUS
  Filled 2019-05-08 (×24): qty 2

## 2019-05-08 NOTE — Progress Notes (Signed)
EVENING ROUNDS NOTE :     [address]       [place] [address]             [phone]                 6 Days Post-Op Procedure(s) (LRB): VIDEO BRONCHOSCOPY USING DISPOSABLE ANESTHESIA SCOPE (N/A) TRACHEOSTOMY (N/A)   Total Length of Stay:  LOS: 24 days  Events:  No events Tolerating CRRT Awake on vent    BP 95/79 (BP Location: Left Arm)   Pulse 84   Temp 97.7 F (36.5 C) (Oral)   Resp 13   Ht 5\' 11"  (1.803 m) Comment: measured x 3  Wt 63.3 kg   SpO2 100%   BMI 19.46 kg/m   CVP:  [8 mmHg-15 mmHg] 10 mmHg  Vent Mode: PSV;CPAP FiO2 (%):  [40 %-60 %] 60 % Set Rate:  [16 bmp] 16 bmp Vt Set:  [450 mL] 450 mL PEEP:  [5 cmH20] 5 cmH20 Pressure Support:  [7 cmH20] 7 cmH20 Plateau Pressure:  [12 cmH20-15 cmH20] 15 cmH20  .  prismasol BGK 4/2.5 400 mL/hr at 05/08/19 1027  .  prismasol BGK 4/2.5 200 mL/hr at 05/08/19 0653  . sodium chloride 10 mL/hr at 05/06/19 0600  . epinephrine Stopped (04/28/19 0138)  . feeding supplement (VITAL 1.5 CAL) Stopped (05/07/19 1730)  . fentaNYL infusion INTRAVENOUS Stopped (04/30/19 1211)  . heparin 10,000 units/ 20 mL infusion syringe 1,000 Units/hr (05/08/19 1527)  . lactated ringers Stopped (04/26/2019 1640)  . lactated ringers Stopped (04/30/19 0930)  . milrinone 0.125 mcg/kg/min (05/08/19 1600)  . norepinephrine (LEVOPHED) Adult infusion 16 mcg/min (05/08/19 1600)  . piperacillin-tazobactam Stopped (05/08/19 1207)  . prismasol BGK 4/2.5 1,800 mL/hr at 05/08/19 1437  . vasopressin (PITRESSIN) infusion - *FOR SHOCK* 0.02 Units/min (05/08/19 1600)    I/O last 3 completed shifts: In: 2278 [I.V.:892.9; NG/GT:1085; IV Piggyback:300] Out: 6384 [Emesis/NG output:71; Other:3220; Stool:200]   CBC Latest Ref Rng & Units 05/08/2019 05/08/2019 05/07/2019  WBC 4.0 - 10.5 K/uL - 18.4(H) -  Hemoglobin 13.0 - 17.0 g/dL 9.9(L) 8.4(L) 10.2(L)  Hematocrit 39.0 - 52.0 % 29.0(L) 28.0(L) 30.0(L)  Platelets 150 - 400 K/uL - 318 -     BMP Latest Ref Rng & Units 05/08/2019 05/08/2019 05/07/2019  Glucose 70 - 99 mg/dL - 151(H) 131(H)  BUN 8 - 23 mg/dL - 29(H) 28(H)  Creatinine 0.61 - 1.24 mg/dL - 1.23 1.18  Sodium 135 - 145 mmol/L 137 136 137  Potassium 3.5 - 5.1 mmol/L 5.1 4.9 4.7  Chloride 98 - 111 mmol/L - 103 103  CO2 22 - 32 mmol/L - 25 26  Calcium 8.9 - 10.3 mg/dL - 7.6(L) 7.7(L)    ABG    Component Value Date/Time   PHART 7.444 05/08/2019 0513   PCO2ART 39.4 05/08/2019 0513   PO2ART 59 (L) 05/08/2019 0513   HCO3 27.3 05/08/2019 0513   TCO2 29 05/08/2019 0513   ACIDBASEDEF 3.0 (H) 04/28/2019 0735   O2SAT 53.3 05/08/2019 0530       Melodie Bouillon, MD 05/08/2019 5:01 PM

## 2019-05-08 NOTE — Progress Notes (Addendum)
Patient ID: Brendan [name], male   DOB: May 09, 1955, 64 y.o.   MRN: [id number]     Advanced Heart Failure Rounding Note  PCP-Cardiologist: No primary care provider on file.   Subjective:    Events - Presented to [facility] with acute HF  EF 20-25% - [facility]red [facility] - Cath 3/30 with severe 3 vessel disease. EF 25% - Developed PMVT on milrinone -> milrinone stopped -> developed worsening shock -> IABP placed on 4/1 - Clinical deterioration 4/1-> intubated - 4/2 underwent Impella 5.5 placement earlier and bilateral chest tubes with 3L out from each side.  - Extubated 4/4 - 04/19/2019 CABG and MVR - Extubated 4/7 - Back to OR for Impella 5.5 extraction 4/9, extubated.  TEE with EF 35%, RV ok, trivial MR s/p MV repair.  - CVVH started 4/10 with oliguria, rising creatinine and CVP.  - Deteriorating respiratory status, intubated again early am 4/12.  Enterococcus faecalis in sputum.  - Extubated 4/14.  Developed respiratory distress and started on Bipap.  Had VT arrest with CPR/epinephrine, intubated again 4/15 early am.  - Tracheostomy 4/16 - Atrial fibrillation early am 4/17  Remains on vent through TC. FiO2 50%.   CXR with severe bilateral infiltrates/ edema. Unasysn switched to zosyn/linezolid on 4/20. WBC 20k -> 21k->18.4. PCT 6.93>> 1.34>>1.28. AF. Sputum cx with yeast (? Colonized)  Remains on CVVHD. Trying to pull 75 ml/hr, but limited by hypotension. CVP 13-14.   On NE 16, vasopressin 0.03 and milrinone 0.125. Co-ox 53%   Echo (4/10): EF 25-30%, normal RV, no significant MR.  I do not think there is an apical thrombus (think trabeculation).    Objective:   Weight Range: 62.4 kg Body mass index is 19.19 kg/m.   Vital Signs:   Temp:  [96.2 F (35.7 C)-97.6 F (36.4 C)] 97.6 F (36.4 C) (04/21 2351) Pulse Rate:  [60-85] 84 (04/22 0715) Resp:  [11-29] 14 (04/22 0715) BP: (67-125)/(42-103) 93/56 (04/22 0715) SpO2:  [91 %-100 %] 100 % (04/22 0715) FiO2 (%):  [40 %-50 %]  50 % (04/22 0517) Last BM Date: 05/07/19  Weight change: Filed Weights   05/05/19 0143 05/06/19 0400 05/07/19 0447  Weight: 61.9 kg 61.6 kg 62.4 kg    Intake/Output:   Intake/Output Summary (Last 24 hours) at 05/08/2019 0745 Last data filed at 05/08/2019 0700 Gross per 24 hour  Intake 1480.24 ml  Output 2670 ml  Net -1189.76 ml      Physical Exam    CVP 13 General:  Thin chronically ill appearing, thin/cachetic WM, Sitting up in bed. On vent through TC HEENT: normal + Cor-trak Neck: supple. + TC. JVD elevated to ear. Carotids 2+ bilat; no bruits. No lymphadenopathy or thryomegaly appreciated. Cor: PMI nondisplaced. Regular rate & rhythm. No rubs, gallops or murmurs. Sternotomy site stable  Lungs: coarse BS bilaterally  Abdomen: soft, nontender, nondistended. No hepatosplenomegaly. No bruits or masses. Good bowel sounds. Extremities: no cyanosis, clubbing, rash, 1+ bilateral ankle/pedal edema Neuro: alert. Follows commands cranial nerves grossly intact. moves all 4 extremities w/o difficulty. Affect pleasant   Telemetry   Paced rhythm 85.  Personally reviewed   Labs    CBC Recent Labs    05/07/19 0502 05/07/19 0540 05/08/19 0500 05/08/19 0513  WBC 21.0*  --  18.4*  --   HGB 8.9*   < > 8.4* 9.9*  HCT 29.2*   < > 28.0* 29.0*  MCV 92.1  --  93.6  --   PLT 319  --  318  --    < > = values in this interval not displayed.   Basic Metabolic Panel Recent Labs    05/07/19 0502 05/07/19 0540 05/07/19 1600 05/07/19 1600 05/08/19 0500 05/08/19 0513  NA 137   < > 137   < > 136 137  K 4.7   < > 4.7   < > 4.9 5.1  CL 105   < > 103  --  103  --   CO2 27   < > 26  --  25  --   GLUCOSE 119*   < > 131*  --  151*  --   BUN 24*   < > 28*  --  29*  --   CREATININE 1.09   < > 1.18  --  1.23  --   CALCIUM 7.7*   < > 7.7*  --  7.6*  --   MG 2.4  --   --   --  2.6*  --   PHOS 2.5   < > 3.0  --  3.0  --    < > = values in this interval not displayed.   Liver Function  Tests Recent Labs    05/07/19 0502 05/07/19 0502 05/07/19 1600 05/08/19 0500  AST 106*  --   --  61*  ALT 178*  --   --  125*  ALKPHOS 544*  --   --  547*  BILITOT 1.2  --   --  1.5*  PROT 6.0*  --   --  6.1*  ALBUMIN 1.9*   < > 1.8* 1.8*   < > = values in this interval not displayed.   Recent Labs    05/07/19 1817  AMYLASE 58   Cardiac Enzymes No results for input(s): CKTOTAL, CKMB, CKMBINDEX, TROPONINI in the last 72 hours.  BNP: BNP (last 3 results) Recent Labs    04/11/2019 0113  BNP 655.7*    ProBNP (last 3 results) No results for input(s): PROBNP in the last 8760 hours.   D-Dimer No results for input(s): DDIMER in the last 72 hours. Hemoglobin A1C No results for input(s): HGBA1C in the last 72 hours. Fasting Lipid Panel No results for input(s): CHOL, HDL, LDLCALC, TRIG, CHOLHDL, LDLDIRECT in the last 72 hours. Thyroid Function Tests No results for input(s): TSH, T4TOTAL, T3FREE, THYROIDAB in the last 72 hours.  Invalid input(s): FREET3  Other results:   Imaging    DG CHEST PORT 1 VIEW  Result Date: 05/08/2019 CLINICAL DATA:  Tracheostomy.  Pleural effusion. EXAM: PORTABLE CHEST 1 VIEW COMPARISON:  05/07/2019. FINDINGS: Tracheostomy tube, feeding tube, right PICC line, left subclavian line in stable position. Prior CABG and cardiac valve replacement. Heart size normal. Diffuse bilateral pulmonary infiltrates/edema. No pleural effusion or pneumothorax. Surgical staples right shoulder. IMPRESSION: 1.  Lines and tubes in stable position. 2. Prior CABG. Cardiac pacer in stable position. Heart size stable. 3. Diffuse bilateral pulmonary infiltrates/edema. Similar findings noted on prior exam. Electronically Signed   By: Alto   On: 05/08/2019 06:57   DG Abd Portable 1V  Result Date: 05/07/2019 CLINICAL DATA:  Feeding tube. EXAM: PORTABLE ABDOMEN - 1 VIEW COMPARISON:  None. FINDINGS: The bowel gas pattern is normal. Distal tip of feeding tube is seen  in expected position of distal stomach or proximal small bowel. No radio-opaque calculi or other significant radiographic abnormality are seen. IMPRESSION: Distal tip of feeding tube seen in expected position of distal stomach or proximal small bowel.  Electronically Signed   By: Marijo Conception M.D.   On: 05/07/2019 10:56     Medications:     Scheduled Medications: . amiodarone  200 mg Per Tube Daily  . aspirin EC  325 mg Oral Daily   Or  . aspirin  324 mg Per Tube Daily  . B-complex with vitamin C  1 tablet Per Tube Daily  . bisacodyl  10 mg Oral Daily   Or  . bisacodyl  10 mg Rectal Daily  . chlorhexidine gluconate (MEDLINE KIT)  15 mL Mouth Rinse BID  . Chlorhexidine Gluconate Cloth  6 each Topical Daily  . docusate  200 mg Per Tube Daily  . feeding supplement (PRO-STAT SUGAR FREE 64)  60 mL Per Tube BID  . gabapentin  300 mg Per Tube Q8H  . insulin aspart  0-24 Units Subcutaneous Q4H  . insulin aspart  4 Units Subcutaneous Q4H  . insulin glargine  10 Units Subcutaneous BID  . linezolid  600 mg Per Tube Q12H  . mouth rinse  15 mL Mouth Rinse 10 times per day  . metoCLOPramide (REGLAN) injection  5 mg Intravenous Q8H  . midodrine  15 mg Per Tube TID WC  . pantoprazole sodium  40 mg Per Tube Daily  . pneumococcal 23 valent vaccine  0.5 mL Intramuscular Tomorrow-1000  . sodium chloride flush  10-40 mL Intracatheter Q12H    Infusions: .  prismasol BGK 4/2.5 400 mL/hr at 05/07/19 2045  .  prismasol BGK 4/2.5 200 mL/hr at 05/08/19 0653  . sodium chloride 10 mL/hr at 05/06/19 0600  . epinephrine Stopped (04/28/19 0138)  . feeding supplement (VITAL 1.5 CAL) Stopped (05/07/19 1730)  . fentaNYL infusion INTRAVENOUS Stopped (04/30/19 1211)  . heparin 10,000 units/ 20 mL infusion syringe 1,000 Units/hr (05/08/19 0539)  . lactated ringers Stopped (05/10/2019 1640)  . lactated ringers Stopped (04/30/19 0930)  . milrinone 0.125 mcg/kg/min (05/08/19 0600)  . norepinephrine (LEVOPHED)  Adult infusion 16 mcg/min (05/08/19 0600)  . piperacillin-tazobactam 100 mL/hr at 05/08/19 0600  . prismasol BGK 4/2.5 1,800 mL/hr at 05/08/19 0251  . vasopressin (PITRESSIN) infusion - *FOR SHOCK* 0.02 Units/min (05/08/19 0600)    PRN Medications: Place/Maintain arterial line **AND** sodium chloride, alteplase, fentaNYL, fentaNYL (SUBLIMAZE) injection, heparin, levalbuterol, metoprolol tartrate, ondansetron (ZOFRAN) IV, ondansetron (ZOFRAN) IV, sodium chloride, sodium chloride flush     Assessment/Plan    1. Acute systolic HF ->  Cardiogenic Shock  - Due to iCM. EF 20-25% - Impella 5.5 placed on 4/2.   - s/p CABG/MVRepair on 4/6  - Impella out 4/9.  - Echo 4/10 with EF 25-30%, normal RV, no MR.   - Now on CVVH. Have been keeping even. Weight trending up. CXR is somewhat worse (bilateral infiltrates/edema). Will try to pull 50-75cc/hr. Continue pressor support - Co-ox low at 43% on norepinephrine 16 + vasopressin 0.02 + milrinone 0.125. On midodrine 15 tid. Will not wean aggressively today. Consider repeat limited echo to r/o pericardial effusion. Dr. Prescott Gum has paced him to help with CO  2. CAD - LHC with severe 3 vessel CAD  - No s/s - s/p CABG x 3 MV Repair 4/6  - on ASA/Crestor  3. Acute hypoxic respiratory failure - Due to pulmonary edema and PNA - Extubated 4/9 re-intubated on 4/12. Failed re-extubation on 4/14. Now s/p tracheostomy - CVP 13-14. Continue CVVH.  - Was on Unasyn for facaelis PNA - Was doing well with TC trials. Now with worsening hypoxia  and increased secretions. Also likely some edema.  - Abx increased 4/20. Per RN, no TF residuals. Sputum cx pending. PCT trending down. - continue w/ CVVHD. Goal fluid removal 50-75 - CCM following.   4. Mitral Regurgitation - Mod-severe on ECHO - s/p MV repair, TEE 4/9 with minimal MR s/p repair.   - Echo 4/10 with no significant MR.   5. Uncontrolled DM - Hgb A1C 9.  - On insulin and sliding scale.  - No  change  6. AKI - due to shock - Became anuric and now on CVVH to control volume overload.  Management as above  7. Acute blood loss Anemia  - HGb 8.4 today  8. Polymorphic VT - Recurrent VT during respiratory distress 4/14, - Now on po amio - no further VT on tele - Keep K> 4.0 Mg > 2.0  - no change today  9. 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 No sensation R and L foot and occasionaly in his hands. Requires assistance with ADLs.   10. Severe Malnutrition - Prealbumin 8.9  - Nutrition on board - Continue w/ TFs  11. ID - Enterococcus faecalis in sputum => Unasyn - Candida in urine => completed fluconazole course.  - AF. WBC climbing. 17.8 -> 20.0->18.4.  - PCT trending down, 6.93>>1.34>>1.28  - Sputum Cultures pending   12. Elevated LFTs - Suspect shock liver - AST/ALT remain stably elevated.  13. Atrial fibrillation. paroxysmal - Now back in NSR. On po amio  14. Debility, severe - will need SNF on d/c  [facility]   Agree with above  He remains very tenuous on multiple pressors and CVHHD. Respiratory status worse over past 2 days with increased secretions Sputum cx with yeast (? Colonized). WBC and PCT dropping on zosyn/linezolid. CVP up. On CVVHD trying to pull 75cc/hr   Chronically ill appearing. Cachetic On TC Neck: supple. +trach JVP to jaw Carotids 2+ bilat; no bruits. No lymphadenopathy or thryomegaly appreciated. Cor: PMI nondisplaced. Regular rate & rhythm. No rubs, gallops or murmurs. Lungs: coarse Abdomen: soft, nontender, nondistended. No hepatosplenomegaly. No bruits or masses. Good bowel sounds. Extremities: no cyanosis, clubbing, rash, tr edema Neuro: alert & orientedx3, cranial nerves grossly intact. moves all 4 extremities w/o difficulty. Affect pleasant  Remains very tenuous. Will continue broad spectrum abx and CVVHD at -75/hr if possible. Attempts at pressor wean have been limited by hypotension. Co-ox back in  shock range. Will repeat limited echo.   CRITICAL CARE Performed by: Glori Bickers  Total critical care time: 35 minutes  Critical care time was exclusive of separately billable procedures and treating other patients.  Critical care was necessary to treat or prevent imminent or life-threatening deterioration.  Critical care was time spent personally by me (independent of midlevel providers or residents) on the following activities: development of treatment plan with patient and/or surrogate as well as nursing, discussions with consultants, evaluation of patient's response to treatment, examination of patient, obtaining history from patient or surrogate, ordering and performing treatments and interventions, ordering and review of laboratory studies, ordering and review of radiographic studies, pulse oximetry and re-evaluation of patient's condition.  Glori Bickers, MD  9:40 AM

## 2019-05-08 NOTE — Progress Notes (Signed)
Nutrition Follow-up  DOCUMENTATION CODES:   Severe malnutrition in context of chronic illness  INTERVENTION:   If N/V persists, recommend advancing Cortrak tube further into good post-pyloric position  Tube Feeding:  -Continue Vital 1.5 @ 45 ml/hr via Cortrak  -Change to 30 mL Pro-Stat QID (spread out more evenly and should be timed to not be administered with meds) -Continue B complex with vitamin C  Provides: 2020 kcals, 133 grams protein, 825 ml free water. Meets 100% of needs   Agree with reglan for delayed gastric emptying, continue zofran as needed. Per discussion in rounds today, MD increasing reglan dose.    NUTRITION DIAGNOSIS:   Severe Malnutrition related to chronic illness as evidenced by severe muscle depletion, severe fat depletion.  Being addressed via TF   GOAL:   Patient will meet greater than or equal to 90% of their needs  Progressing  MONITOR:   Vent status, Diet advancement, Labs, Weight trends, TF tolerance  REASON FOR ASSESSMENT:   Consult Poor PO, Assessment of nutrition requirement/status  ASSESSMENT:   64 yo male admitted with acute systeolic CHF, CAD with 3 vessel disease with plan for CABG. PMH includes DM with HgbA1c 9 with neuropathy, HTN   3/30 Cardiac Cath with severe 3-vessel CAD 3/31 ECHO: EF 30-35% 4/01 IABP placed, Intubated 4/02 Impella placed, bilateral CT placed for pleural effusions 4/04 Extubated 4/06 CABG, MV repair, Intubated 4/07 Extubated 4/09 Impella removed, LVAD placed, Cortrak placed, TF initiated 4/10 CRRT initiated 4/12 Re-intubated 4/14 Extubated 4/15 Re-intubated 4/16 Trach, Bronchoscopy   Pt on and off vent support via trach  Pt remains on CRRT Requiring vasopressin, levophed, milrinone  SLP following, remains NPO  Pt continues to complain of nausea today.    Abd xray performed yesterday; normal bowel gas pattern. Abd xray with Cortrak tube tip in distal stomach vs proximal small bowel.  +BM,  abdomen soft  Agree with anti-nausea medicine and addition of reglan for delayed gastric emptying.   Vital 1.5 at 45 ml/hr via Cortrak, Pro-Stat 60 mL BID. Pt currently getting 2 packets of Pro-Stat at a time. Per RN, pt felt nauseous after receiving multiple meds and 2 packets of Pro-Stat this AM. Plan to space pro-stat administration out more evenly and not administer with multiple meds.   Current weight 63.2 kg; lowest weight 61.6 kg. Admit weight 68 kg.   Labs: potassium 5.2, CBGs 116-199 Meds: B-complex with C, ss novolog, novolog q 4 hours, lantus, reglan, midodrine  Diet Order:   Diet Order    None      EDUCATION NEEDS:   Education needs have been addressed  Skin:  Skin Assessment: Skin Integrity Issues: Skin Integrity Issues:: Incisions Incisions: R axilla, chest, bilateral legs, neck  Last BM:  4/21  Height:   Ht Readings from Last 1 Encounters:  04/19/2019 5\' 11"  (1.803 m)    Weight:   Wt Readings from Last 1 Encounters:  05/08/19 63.3 kg    BMI:  Body mass index is 19.46 kg/m.  Estimated Nutritional Needs:   Kcal:  1900-2100 kcal  Protein:  120-135 grams  Fluid:  >/= 1.8 L/day   Kerman Passey MS, RDN, LDN, CNSC RD Pager Number and Weekend/On-Call After Hours Pager Located in [place]

## 2019-05-08 NOTE — Progress Notes (Signed)
NAME:  Brendan [name], MRN:  [id number], DOB:  11/23/1955, LOS: 45 ADMISSION DATE:  04/01/2019, CONSULTATION DATE:  04/17/19 REFERRING MD:  Dr. Haroldine [name], CHIEF COMPLAINT:  Respiratory distress   Brief History   52 yoM originally presented with SOB and fatigue at [facility] found to have new HFrEF, NSTEMI, and bilateral pleural effusions transferred to [facility] on 3/29 for further cardiac evaluation. 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 Started on lasix and milrinone gtts however developed NSVT. Was taken 4/1 for placement of IABP and swan for optimization prior to CABG. Was on precedex for agitation/ confusion, some concern for DTs. On the evening of 4/1, patient developed worsening respiratory distress and hypoxia, PCCM consulted for intubation and vent management.   He was successfully extubated on 4/4 and PCCM signed off. Impella was removed 4/11 and patient noted to have increased WOB throughout the day he was placed on Bipap, but mental status continued to worsen and PCCM consulted for possible re-intubation  History of present illness   Brendan [name] is a 64 yo male withpoorly controlled diabetes complicated by neuropathy and hypertension who presented to an outside hospital on 3/26 with a chief complaint of general malaise and shortness of breath for the past 4 days. He is now transferred to [facility] for evaluation of troponin elevation, evaluation of newly depressed EF,and consideration for coronary angiography.  History obtained from patient report as well as discharge paperwork from outside hospital. Briefly, patient reportedly had nausea, vomiting, diarrhea approximately 1 week and is confined to his bed. He finally presented to the outside hospital ED he was found to have a blood sugar of 516, BUN of 53, creatinine 1.2, and anion gap of 17. Patient's BNP was elevated to 8900. Troponin was elevated to 13.1 with changes noted by the team of their lateral leads as well as V1 and V2.  Patient was started on aspirin and a heparin drip. Chest x-ray on admission was concerning for bilateral pulmonary opacities differential including infection and pulmonary edema. So he was started on empiric ceftriaxone and doxycycline, although his procalcitonin was negative. He had a chest wall echo that was performed that showed an EF of 25% was diuresed during his admission with improvement in his shortness of breath as well as radiographically. On discussion with patient he has been chest pain-free since his admission totheoutside hospital. In addition, he feels that his shortness of breath is much improved.the  Patient had left heart cath on 03/29/2019 showing severe three-vessel disease. 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 He then developed worsening cardiogenic shock and was intubated on 4/1 andplaced on a IABP on 4/02. He also had bilateral chest tubes placed with3 L removed from each side. He was then extubated on 4/4. On 4/6 patient had CABG/MVRperformed and was extubated on 4/7 with improved mentation with weaning of sedation and pressors. Patient had Impella removed on 4/9 and subsequent TEE showing an EF of 35%with trivial MR s/p MV repair. Remained on BiPAP at this time.On 4/10 nephrology was consulted due to anuric sinceCABG and started on CRRT.Developing worsening shortness of breath and increased work of breathing on BiPAP with hypoxia on ABG and subsequently reintubated on 4/12.  Patient was extubated on 4/14 and developed worsening hypoxia on BiPAP. Overnight he developed pulseless VT requiring 1 minute of CPR and epinephrine to achieve ROSC. Neurovascularly intact afterward. Subsequently had increased work of breathing and worsening respiratory distress requiring intubation.  Patient had tracheostomy performed on  05/14/2019. Trying to wean off ventilator.   Past Medical History  Tobacco abuse, HTN, poorly  controlled diabetes, diabetic neuropathy  Significant Hospital Events   3/30 Lt heart cath/TEE performed 4/1 Intubated, RHC/IABP/PA cath 4/2 Impella placement 4/3 febrile over evening and night. Blood cultures obtained. Vanc/cefepime started. Hematuria--heparin held. 4/4 Extubated,abx transitioned to rocephin. Heparin restarted 4/6 Re-intubated for CABG/MVR 4/7 Extubated from CABG to BiPAP 4/9 Impella removed  4/9 TEE 4/10 CVC inserted &CRRT initiated  4/12 PCCMre-consulted re-intubation 4/14 Extubated to BiPAP 4/15 Re-intubated due to respiratory distress  Consults:  TCTS PCCM HF  Procedures:  4/15ET tube - 4/15 4/9 Cortrak> 4/6 Rt IJ> 4/10 HD cath- 4/13 4/12 foley cath> 3/30 PICC> 4/13 HD cath> 4/16 Tracheostomy>  Significant Diagnostic Tests:  3/30 R/ LHC >>  Ost LM to Mid LM lesion is 35% stenosed.  Prox LAD lesion is 90% stenosed.  Prox LAD to Mid LAD lesion is 50% stenosed.  Mid Cx lesion is 100% stenosed.  Prox RCA to Mid RCA lesion is 90% stenosed.  RPDA lesion is 95% stenosed.  LV end diastolic pressure is severely elevated.  Hemodynamic findings consistent with moderate pulmonary hypertension. 1. Severe 3 vessel obstructive CAD 2. High LV filling pressures 3. Reduced cardiac output with index 2.3 4. Moderate pulmonary HTN.  3/30TTE >> 1. Left ventricular ejection fraction, by estimation, is 30 to 35%. The left ventricle has moderately decreased function. The left ventricle demonstrates regional wall motion abnormalities.There is mild left ventricular hypertrophy. Left ventricular diastolic function could not be evaluated. There is severe hypokinesis of the left ventricular, entire inferior wall, inferoseptal wall, apical segment and lateral wall.  2. Right ventricular systolic function is hyperdynamic. The right ventricular size is normal. There is moderately elevated pulmonary artery systolic pressure.  3. Decreased posterior leaflet  motion due to ischemic tethering of the mitral valve leaflets.  4. The mitral valve is abnormal. Moderate to severe mitral valve regurgitation.  5. The aortic valve is tricuspid. Aortic valve regurgitation is not visualized. Mild aortic valve sclerosis is present, with no evidence of aortic valve stenosis.  6. The inferior vena cava is normal in size with greater than 50% respiratory variability, suggesting right atrial pressure of 3 mmHg.   4/1 CT chest w/o >>Large bilateral pleural effusions with associated atelectasis.Moderate to severe bilateral ground-glass opacities, likely reflecting pneumonia and/or edema.  4/1 RHC >> RA = 18 RV = 60/20  PA = 63/29 (42) PCW = 33 Fick cardiac output/index = 3.7/1.9 PVR = 2.5 WU FA sat = 98% PA sat = 47%, 49% PaPi = 2.2   Micro Data:  3/30 MRSA PCR >> neg 4/1 MRSA PCR >> neg 4/2 resp culture>>rare gram pos cocci, few candida albicans 4/3 Urine culture>>negative 4/4 blood cultures>>no growth  4/9 Surgical wound>> rare enterococcus faecalis 4/9 BCx2>>no gorwth 4/9 Urine Culture>> greater than 100k yeast 4/12 Resp >. Negative 4/16 BAL >> negative (AFB, fungal negative) 4/20 Resp >> rare yeast >>  4/20 blood >>  4/21 Resp >> rare yeast >>    Antimicrobials:  PTA ceftriaxone/ doxycycline >> 3/29 Ceftriaxone 4/4-4/5 Cefepime 4/4>>4/11 vanc 4/4>>4/6 Rocephin 4/4>>4/5 Cefuroxime 4/6-4/7 Ampicillin 4/11 Vancomycin 4/12>>4/13 Cefepime 4/12>>4/13 Augmentin / unasyn 4/15> 4/20 Zosyn 4/20 >>  linezolid 4/20 >>    Interim history/subjective:  Has been doing some daily ATC, but fiO2 increased to 0.60 this am after paO2 59, currently PSV with 0.60 Remains on norepi 17, milrinone 0.125, vasopressin 0.02 Thick secretions reported. Some question aspiration 4/21  on suctioning > TF restarted, no residuals reported.   Objective   Blood pressure (!) 93/56, pulse 84, temperature 97.6 F (36.4 C), temperature source Oral, resp. rate  14, height _0  (1.803 m), weight 62.4 kg, SpO2 100 %. CVP:  [8 mmHg] 8 mmHg  Vent Mode: PRVC FiO2 (%):  [40 %-50 %] 50 % Set Rate:  [16 bmp] 16 bmp Vt Set:  [450 mL] 450 mL PEEP:  [5 cmH20] 5 cmH20 Plateau Pressure:  [12 cmH20-15 cmH20] 15 cmH20   Intake/Output Summary (Last 24 hours) at 05/08/2019 0740 Last data filed at 05/08/2019 0700 Gross per 24 hour  Intake 1480.24 ml  Output 2670 ml  Net -1189.76 ml   Filed Weights   05/05/19 0143 05/06/19 0400 05/07/19 0447  Weight: 61.9 kg 61.6 kg 62.4 kg    Examination: General appearance: Thin ill appearing man, NAD Eyes: open, tracks HENT: trach in good position, cuff up Lungs: distant w B insp crackles on PSV CV: RRR, s1 s2, pacer wires still in place  Abdomen: soft, nt nd  Extremities: no edema  Neuro: alert, oriented, following commands   CXR 4/22 reviewed by me, no significant change B alveolar / interstit infiltrates, trach OK  Resolved Hospital Problem list     Assessment & Plan:  Acute hypoxic respiratory failure, multifactorial ATC as he can tolerate, currently on PSV with fiO2 0.60. ? Whether we are getting mixed venous gasses > SpO2 = 100% Trach care, sutures to be removed 4/22 Only Resp cx data positive if yeast, likely contaminant /  Colonizer On empiric zosyn + vanco, following cx above to completion SLP, ? In-line speaking valve, although with concern for ongoing aspiration may be premature.   HFrEF (20-25%), cardiogenic shock, NSTEMI with three-vessel disease, NSVT Polymorphic VT Advanced HF service anad TCTS managing milrinone, NE, vaso as above Diuresis as he can tolerate Remains on amiodarone  Severe Malnutrition TF held 4/21 due to concern risk aspiration  AKI possibly ATN in the setting of cardiogenic shock, anuric  Tolerating CVVHD, no evidence UOP at this time. Cardiorenal syndrome, ? Whether he will be able to ever tolerate iHD - significant long range issue  Diabetes mellitus SSI  protocol + lantus  Elevated transaminase enzymes with cholestasis pattern Most consistent with hepatic congestion  Best practice:  Diet: Tube feeds Pain/Anxiety/Delirium protocol (if indicated):Fentanyl VAP protocol (if indicated):HOB 30 degrees,  DVT prophylaxis: heparin  GI prophylaxis: PPI Glucose control:SSI Mobility: BR Code Status: Full  Family Communication:per primary team  Disposition:ICU  Labs   CBC: Recent Labs  Lab 05/04/19 0303 05/04/19 0422 05/05/19 0332 05/05/19 0441 05/06/19 0314 05/06/19 0421 05/07/19 0502 05/07/19 0502 05/07/19 0540 05/07/19 0929 05/07/19 1234 05/08/19 0500 05/08/19 0513  WBC 20.8*  --  17.8*  --  20.0*  --  21.0*  --   --   --   --  18.4*  --   HGB 8.0*   < > 7.7*   < > 9.1*   < > 8.9*   < > 10.2* 10.2* 10.2* 8.4* 9.9*  HCT 24.8*   < > 24.2*   < > 29.3*   < > 29.2*   < > 30.0* 30.0* 30.0* 28.0* 29.0*  MCV 88.3  --  89.0  --  91.3  --  92.1  --   --   --   --  93.6  --   PLT 266  --  296  --  327  --  319  --   --   --   --  318  --    < > = values in this interval not displayed.    Basic Metabolic Panel: Recent Labs  Lab 05/04/19 0303 05/04/19 0422 05/05/19 0332 05/05/19 0441 05/06/19 0314 05/06/19 0421 05/06/19 1557 05/06/19 1557 05/07/19 0502 05/07/19 0540 05/07/19 0929 05/07/19 1234 05/07/19 1600 05/08/19 0500 05/08/19 0513  NA 136  134*   < > 137  138   < > 137   < > 138   < > 137   < > 137 136 137 136 137  K 4.6  4.4   < > 4.5  4.4   < > 4.8   < > 4.7   < > 4.7   < > 4.9 5.2* 4.7 4.9 5.1  CL 102  100   < > 104  103   < > 103  --  103  --  105  --   --   --  103 103  --   CO2 24  24   < > 25  26   < > 25  --  29  --  27  --   --   --  26 25  --   GLUCOSE 179*  198*   < > 139*  133*   < > 89  --  236*  --  119*  --   --   --  131* 151*  --   BUN 22  21   < > 21  21   < > 23  --  25*  --  24*  --   --   --  28* 29*  --   CREATININE 1.24  1.21   < > 1.16  1.17   < > 1.16  --  1.14  --  1.09  --    --   --  1.18 1.23  --   CALCIUM 7.2*  7.0*   < > 7.3*  7.4*   < > 7.6*  --  7.5*  --  7.7*  --   --   --  7.7* 7.6*  --   MG 2.4  --  2.5*  --  2.5*  --   --   --  2.4  --   --   --   --  2.6*  --   PHOS 2.0*   < > 2.1*   < > 2.7  --  2.4*  --  2.5  --   --   --  3.0 3.0  --    < > = values in this interval not displayed.   GFR: Estimated Creatinine Clearance: 55.7 mL/min (by C-G formula based on SCr of 1.23 mg/dL). Recent Labs  Lab 05/05/19 0332 05/06/19 0314 05/07/19 0502 05/07/19 0954 05/08/19 0500  PROCALCITON  --   --   --  1.34 1.28  WBC 17.8* 20.0* 21.0*  --  18.4*    Liver Function Tests: Recent Labs  Lab 05/04/19 0303 05/04/19 1530 05/05/19 0332 05/05/19 1606 05/06/19 0314 05/06/19 1557 05/07/19 0502 05/07/19 1600 05/08/19 0500  AST 286*  --  327*  --  190*  --  106*  --  61*  ALT 239*  --  255*  --  242*  --  178*  --  125*  ALKPHOS 529*  --  596*  --  621*  --  544*  --  547*  BILITOT 1.2  --  0.8  --  1.1  --  1.2  --  1.5*  PROT 5.4*  --  5.3*  --  6.0*  --  6.0*  --  6.1*  ALBUMIN 2.1*  2.0*   < > 1.8*  1.7*   < > 1.8* 1.8* 1.9* 1.8* 1.8*   < > = values in this interval not displayed.   Recent Labs  Lab 05/07/19 1817  AMYLASE 58   No results for input(s): AMMONIA in the last 168 hours.  ABG    Component Value Date/Time   PHART 7.444 05/08/2019 0513   PCO2ART 39.4 05/08/2019 0513   PO2ART 59 (L) 05/08/2019 0513   HCO3 27.3 05/08/2019 0513   TCO2 29 05/08/2019 0513   ACIDBASEDEF 3.0 (H) 04/28/2019 0735   O2SAT 53.3 05/08/2019 0530     Coagulation Profile: No results for input(s): INR, PROTIME in the last 168 hours.  Cardiac Enzymes: No results for input(s): CKTOTAL, CKMB, CKMBINDEX, TROPONINI in the last 168 hours.  HbA1C: HbA1c, POC (controlled diabetic range)  Date/Time Value Ref Range Status  01/14/2019 02:02 PM 12.5 (A) 0.0 - 7.0 % Final  08/21/2017 02:03 PM 8.5 (A) 0.0 - 7.0 % Final   Hgb A1c MFr Bld  Date/Time Value Ref  Range Status  03/21/2019 09:49 PM 9.0 (H) 4.8 - 5.6 % Final    Comment:    (NOTE) Pre diabetes:          5.7%-6.4% Diabetes:              >6.4% Glycemic control for   <7.0% adults with diabetes     CBG: Recent Labs  Lab 05/07/19 1157 05/07/19 1612 05/07/19 2015 05/07/19 2353 05/08/19 0531  GLUCAP 199* 116* 97 106* 126*    Critical care time: NA     Baltazar Apo, MD, PhD 05/08/2019, 8:05 AM [facility] [phone] or if no answer [phone]

## 2019-05-08 NOTE — Progress Notes (Addendum)
TCTS DAILY ICU PROGRESS NOTE                   [place].[address]            [place] [address]          [phone]   6 Days Post-Op Procedure(s) (LRB): VIDEO BRONCHOSCOPY USING DISPOSABLE ANESTHESIA SCOPE (N/A) TRACHEOSTOMY (N/A)  Total Length of Stay:  LOS: 24 days   Subjective: Pt on vent this am and sleeping this am  Objective: Vital signs in last 24 hours: Temp:  [96.2 F (35.7 C)-97.6 F (36.4 C)] 97.6 F (36.4 C) (04/21 2351) Pulse Rate:  [60-85] 84 (04/22 0715) Cardiac Rhythm: Normal sinus rhythm (04/22 0400) Resp:  [11-29] 14 (04/22 0715) BP: (67-125)/(42-103) 93/56 (04/22 0715) SpO2:  [91 %-100 %] 100 % (04/22 0715) FiO2 (%):  [40 %-50 %] 50 % (04/22 0517)  Filed Weights   05/05/19 0143 05/06/19 0400 05/07/19 0447  Weight: 61.9 kg 61.6 kg 62.4 kg    Weight change:    Hemodynamic parameters for last 24 hours: CVP:  [8 mmHg] 8 mmHg  Intake/Output from previous day: 04/21 0701 - 04/22 0700 In: 1480.2 [I.V.:579.5; NG/GT:740; IV Piggyback:160.7] Out: 2670 [Emesis/NG output:71; Stool:200]  Intake/Output this shift: No intake/output data recorded.  Current Meds: Scheduled Meds: . amiodarone  200 mg Per Tube Daily  . aspirin EC  325 mg Oral Daily   Or  . aspirin  324 mg Per Tube Daily  . B-complex with vitamin C  1 tablet Per Tube Daily  . bisacodyl  10 mg Oral Daily   Or  . bisacodyl  10 mg Rectal Daily  . chlorhexidine gluconate (MEDLINE KIT)  15 mL Mouth Rinse BID  . Chlorhexidine Gluconate Cloth  6 each Topical Daily  . docusate  200 mg Per Tube Daily  . feeding supplement (PRO-STAT SUGAR FREE 64)  60 mL Per Tube BID  . gabapentin  300 mg Per Tube Q8H  . insulin aspart  0-24 Units Subcutaneous Q4H  . insulin aspart  4 Units Subcutaneous Q4H  . insulin glargine  10 Units Subcutaneous BID  . linezolid  600 mg Per Tube Q12H  . mouth rinse  15 mL Mouth Rinse 10 times per day  . metoCLOPramide (REGLAN) injection  5 mg Intravenous Q8H    . midodrine  15 mg Per Tube TID WC  . pantoprazole sodium  40 mg Per Tube Daily  . pneumococcal 23 valent vaccine  0.5 mL Intramuscular Tomorrow-1000  . sodium chloride flush  10-40 mL Intracatheter Q12H   Continuous Infusions: .  prismasol BGK 4/2.5 400 mL/hr at 05/07/19 2045  .  prismasol BGK 4/2.5 200 mL/hr at 05/08/19 0653  . sodium chloride 10 mL/hr at 05/06/19 0600  . epinephrine Stopped (04/28/19 0138)  . feeding supplement (VITAL 1.5 CAL) Stopped (05/07/19 1730)  . fentaNYL infusion INTRAVENOUS Stopped (04/30/19 1211)  . heparin 10,000 units/ 20 mL infusion syringe 1,000 Units/hr (05/08/19 0539)  . lactated ringers Stopped (04/28/2019 1640)  . lactated ringers Stopped (04/30/19 0930)  . milrinone 0.125 mcg/kg/min (05/08/19 0600)  . norepinephrine (LEVOPHED) Adult infusion 16 mcg/min (05/08/19 0600)  . piperacillin-tazobactam 100 mL/hr at 05/08/19 0600  . prismasol BGK 4/2.5 1,800 mL/hr at 05/08/19 0251  . vasopressin (PITRESSIN) infusion - *FOR SHOCK* 0.02 Units/min (05/08/19 0600)   PRN Meds:.Place/Maintain arterial line **AND** sodium chloride, alteplase, fentaNYL, fentaNYL (SUBLIMAZE) injection, heparin, levalbuterol, metoprolol tartrate, ondansetron (ZOFRAN) IV, ondansetron (ZOFRAN) IV,  sodium chloride, sodium chloride flush  Heart: RRR Lungs: Rhonchi this am Abdomen: Soft, non tender,  bowel sounds present Extremities: Mild LE edema ( mid shin downward) and feet have are edematous Wound: Sternal dressing is clean and dry;right axillary wound is clean and dry. Of note, the most inferior portion of sternal wound has superficial dehiscence with some sero sanguinous drainage but no sign of infection. He also has had some bloody like drainage from left chest tube wound.  Lab Results: CBC: Recent Labs    05/07/19 0502 05/07/19 0540 05/08/19 0500 05/08/19 0513  WBC 21.0*  --  18.4*  --   HGB 8.9*   < > 8.4* 9.9*  HCT 29.2*   < > 28.0* 29.0*  PLT 319  --  318  --    < >  = values in this interval not displayed.   BMET:  Recent Labs    05/07/19 1600 05/07/19 1600 05/08/19 0500 05/08/19 0513  NA 137   < > 136 137  K 4.7   < > 4.9 5.1  CL 103  --  103  --   CO2 26  --  25  --   GLUCOSE 131*  --  151*  --   BUN 28*  --  29*  --   CREATININE 1.18  --  1.23  --   CALCIUM 7.7*  --  7.6*  --    < > = values in this interval not displayed.    CMET: Lab Results  Component Value Date   WBC 18.4 (H) 05/08/2019   HGB 9.9 (L) 05/08/2019   HCT 29.0 (L) 05/08/2019   PLT 318 05/08/2019   GLUCOSE 151 (H) 05/08/2019   CHOL 174 08/21/2017   TRIG 143 08/21/2017   HDL 57 08/21/2017   LDLCALC 88 08/21/2017   ALT 125 (H) 05/08/2019   AST 61 (H) 05/08/2019   NA 137 05/08/2019   K 5.1 05/08/2019   CL 103 05/08/2019   CREATININE 1.23 05/08/2019   BUN 29 (H) 05/08/2019   CO2 25 05/08/2019   TSH 0.925 04/10/2019   INR 1.6 (H) 05/01/2019   HGBA1C 9.0 (H) 04/09/2019   MICROALBUR 30 08/21/2017    PT/INR:  No results for input(s): LABPROT, INR in the last 72 hours. Radiology: DG CHEST PORT 1 VIEW  Result Date: 05/08/2019 CLINICAL DATA:  Tracheostomy.  Pleural effusion. EXAM: PORTABLE CHEST 1 VIEW COMPARISON:  05/07/2019. FINDINGS: Tracheostomy tube, feeding tube, right PICC line, left subclavian line in stable position. Prior CABG and cardiac valve replacement. Heart size normal. Diffuse bilateral pulmonary infiltrates/edema. No pleural effusion or pneumothorax. Surgical staples right shoulder. IMPRESSION: 1.  Lines and tubes in stable position. 2. Prior CABG. Cardiac pacer in stable position. Heart size stable. 3. Diffuse bilateral pulmonary infiltrates/edema. Similar findings noted on prior exam. Electronically Signed   By: [place]   On: 05/08/2019 06:57   DG Abd Portable 1V  Result Date: 05/07/2019 CLINICAL DATA:  Feeding tube. EXAM: PORTABLE ABDOMEN - 1 VIEW COMPARISON:  None. FINDINGS: The bowel gas pattern is normal. Distal tip of feeding tube is  seen in expected position of distal stomach or proximal small bowel. No radio-opaque calculi or other significant radiographic abnormality are seen. IMPRESSION: Distal tip of feeding tube seen in expected position of distal stomach or proximal small bowel. Electronically Signed   By: Marijo Conception M.D.   On: 05/07/2019 10:56    Assessment/Plan: S/P Procedure(s) (LRB): VIDEO BRONCHOSCOPY USING  DISPOSABLE ANESTHESIA SCOPE (N/A) TRACHEOSTOMY (N/A)  1. CV-S/p removal of Impella on 04/09.  S/p V tach arrest 04/15. Previous a fib. Paced this am (to help with CO) and SBP remains in the low 80's.  On Amiodarone 200 mg daily and Midodrine 15 mg tid. Also, on Nor epinephrine drip at 16 mcg/min, Milrinone 0.125 mcg/kg/min, and Vasopressin .02 units/min drips. Weaning drips as able.  Co ox this am decreased to 53.3 2. Pulmonary-S/p trach 04/16. ABG results this am noted.On trach collar during the day and ventilator at night but has been requiring more ventilator last 24 hours secondary to worsening hypoxia. FiO2% increased to 50 for better oxygenation as CXR yesterday showed more infiltrates.  CXR this am shows diffuse bilateral pulmonary infiltrates/edema but no pneumothorax.  3. Expected post op blood loss anemia-H and H this am slightly decreased to 8.4 and 28 4. DM-CBGs 97/106/126. On Insulin. He was on Insulin and Metformin 1000 mg bid prior to surgery. Continue on Insulin for now as creatinine elevated and NPO. Pre op HGA1C 9. He will need close medical follow up after discharge 5. AKI-Anuric. Creatinine 1.23 this am. Nephrology following and arranging for CVVHD accordingly 6. GI-severe malnutrition of chronic illness.  Cortrak, TFs. Speech pathology evaluation done yesterday and recommendations to be followed accordingly. Albumin is low at 1.8 7. Elevated transaminases and are decreasing  (likely related to shock)-AST 61, ALT 125, ALk phos 547.  T bili slightly elevated at 1.5 this am 8. ID-Leukocytosis  with slight decrease in WBC to 18,400 (likely related to aspiration). Pro calcitonin this am 1.28. On Zyvox and Zosyn for broader coverage. Of note, he completed Fluconazole for Candida in urine. Completed Vanco and Cefepime for Enterococcus Faecalis. Tracheal aspirate culture results pending 9. Acute systolic heart failure-CVVHD helping with volume status 10. Extremely deconditioned-will need PT as able  [name] PA-C 05/08/2019 7:19 AM   Pulmonary setback with patient requiring mechanical ventilation probably due to pneumonia, airway inflammation.  Budding yeast in airway concerning for [place] blastomycosis and may  need ID evaluation.  Patient in sinus rhythm being BiV paced with epicardial wires Continues low to medium dose requirement of norepinephrine milrinone and epinephrine.  Echocardiogram performed today shows EF remaining at 35-40% without MR.  No pericardial effusion.  Continue CVVH, ventilator support, antibiotics, tube feeds, and inotropes.  Patient alert responsive and family at bedside providing support

## 2019-05-08 NOTE — Progress Notes (Signed)
SLP Cancellation Note  Patient Details Name: ALADDIN KOLLMANN MRN: [id number] DOB: 01-19-1955   Cancelled treatment:       Reason Eval/Treat Not Completed: Medical issues which prohibited therapy(Pt is back on vent at this time. SLP will follow up on subsequent date. )  Zacary Bauer I. Hardin Negus, [place], [place] Office number [phone] Pager [place] 05/08/2019, 3:04 PM

## 2019-05-08 NOTE — Consult Note (Signed)
Brendan [name] for Infectious Disease    Date of Admission:  04/09/2019      Total days of antibiotics 21  Day                Reason for Consult: Budding yeast in sputum     Referring Provider: HF team  Primary Care Provider: [name], MD    Assessment: Brendan [name] is a 64 y.o. male with new onset heart failure now s/p CABG and MR with ongoing cardiogenic shock and multiorgan failure He has had ongoing leukocytosis and intermittent fevers with candia albicans in sputum, yeast in urine and now a budding yeast yet to be identified on recent sputum culture.  Would be less concerned about blastomycosis or other severe fungal pneumonia given no findings suggesting cavitary or nodular disease in the lungs.  With his vomiting this may be reflective of aspiration of gastric contents.  Will see what is growing from cultures to further guide antimicrobial therapy, but hold on empiric coverage at this time.   He has been afebrile since adding Unasyn to treat enterococcus pneumonia and possible anaerobic flora. Would continue with Piperacillin-Tazobactam given concern for aspiration; can continue Linezolid for now but with blood cultures all negative and no MRSA recovered likely can stop soon.    ?if unasyn contributed to CHF and acutely worsened respiratory status as this has a pretty high salt load.   Elevated LFTs - U/S revealed GB has been removed, no significant dilation noted of ducts but partially obscured views with abd bandages. Alk Phos worsening - Does have some diffuse abdominal pain and vomiting. ?congestive hepatopathy   Plan: 1. Continue current antimicrobials  2. Follow micro data      Active Problems:   Encounter for intubation   NSTEMI (non-ST elevated myocardial infarction) ([place])   Acute systolic congestive heart failure (HCC)   Protein-calorie malnutrition, severe   Pulmonary edema with congestive heart failure (HCC)   Severe mitral  regurgitation   3-vessel CAD   S/P MVR (mitral valve repair)   Chronic respiratory failure with hypoxia (HCC)   Aspiration pneumonia (HCC)   Endotracheal tube present   S/P CABG x 3   . amiodarone  200 mg Per Tube Daily  . aspirin EC  325 mg Oral Daily   Or  . aspirin  324 mg Per Tube Daily  . B-complex with vitamin C  1 tablet Per Tube Daily  . bisacodyl  10 mg Oral Daily   Or  . bisacodyl  10 mg Rectal Daily  . chlorhexidine gluconate (MEDLINE KIT)  15 mL Mouth Rinse BID  . Chlorhexidine Gluconate Cloth  6 each Topical Daily  . docusate  200 mg Per Tube Daily  . feeding supplement (PRO-STAT SUGAR FREE 64)  60 mL Oral Q6H  . gabapentin  300 mg Per Tube Q8H  . insulin aspart  0-24 Units Subcutaneous Q4H  . insulin aspart  4 Units Subcutaneous Q4H  . insulin glargine  10 Units Subcutaneous BID  . linezolid  600 mg Per Tube Q12H  . mouth rinse  15 mL Mouth Rinse 10 times per day  . metoCLOPramide (REGLAN) injection  10 mg Intravenous Q8H  . midodrine  15 mg Per Tube TID WC  . pantoprazole sodium  40 mg Per Tube Daily  . pneumococcal 23 valent vaccine  0.5 mL Intramuscular Tomorrow-1000  . sodium chloride flush  10-40 mL Intracatheter Q12H  HPI: Brendan [name] is a 64 y.o. male admitted from [facility] on 3/29 after presenting with generalized malaise and SOB x 4 days. He was transferred to [facility] for cardiology services with (+) troponin and newly depressed EF < 30%. He had nausea/vomiting/diarrhea for 1 week. Troponin 13.1. CXR with b/l pulmonary opacities --> started on empiric Ceftriaxone + Doxycycline (PCT negative at that time). These were stopped 3/30. He underwent R/L Heart Cath 3/30, Impella placement 4/02, CABG + MVR on 4/06, removal of impella 4/09, Video Bronchoscopy 4/16  CT chest on 4/01 revealed large b/l pleural effusions, moderate to severe b/l ground-glass opacities likely reflecting pna or edema   He has since failed extubation efforts and now has  tracheostomy placed. CRRT is ongoing and pressor dependent still with Levophed, Vasopressin and Milrinone. FiO2 is 60% today whereas he was previously doing better with trach trials off ventilator. Plans to continue with CRRT until pressors weaned off.    Previous cultures revealed: 4/02 - sputum with candida albicans  4/02 pleural fluid - no growth  4/04 blood - no growth  4/09 urine - yeast  4/09 blood - no growth  4/09 bronchial washing rare enterococus faecalis (S-amp) 4/12 trach aspirate - no growth  4/20 - trach aspirate rare yeast on stain with culture re-incubating  4/21 - trach aspirate rare budding yeast with culture re-incubating   Treated with Cefepime 4/07 - 4/13, Vancomycin 4/03 - 4/15. Unasyn 4/16 - 4/20; linezolid 4/20 (ongoing) with zosyn since 4/20 (ongoing). Fluconazole x 3 days 4/11 200 mg Q24h.   He is alert and states he has been vomiting - 3 episodes over the last few days but none today. He denies much of a cough but RN reports previous shift reported increased volume of sputum.  He states he has always lived in [place] and has not spent any significant time outside the [place] or in other states. No pets at home or unusual animal encounters. He does not smoke. He has poorly controlled diabetes.    Review of Systems: Review of Systems  Constitutional: Negative for chills and fever.  Respiratory: Negative for cough, sputum production and shortness of breath.   Cardiovascular: Negative for chest pain.  Gastrointestinal: Positive for abdominal pain, nausea and vomiting.  Genitourinary: Negative for dysuria.  Musculoskeletal:       Groin pain b/l   Skin: Negative for itching and rash.    Past Medical History:  Diagnosis Date  . Cholecystitis 03/2013  . Complication of anesthesia   . Diabetes mellitus without complication ([place])   . Erectile dysfunction 2010  . HTN (hypertension)   . NSTEMI (non-ST elevated myocardial infarction) ([place]) 03/22/2019  . PONV (postoperative  nausea and vomiting)     Social History   Tobacco Use  . Smoking status: Never Smoker  . Smokeless tobacco: Current User    Types: Snuff, Chew  Substance Use Topics  . Alcohol use: Not Currently    Comment: occ  . Drug use: No    Family History  Problem Relation Age of Onset  . Colon cancer Maternal Aunt   . Diabetes Maternal Grandmother   . Stroke Maternal Grandmother    Allergies  Allergen Reactions  . Acetaminophen Itching    OBJECTIVE: Blood pressure (!) 102/58, pulse 84, temperature 97.7 F (36.5 C), temperature source Oral, resp. rate 19, height '5\' 11"'$  (1.803 m), weight 63.3 kg, SpO2 100 %.  Physical Exam Constitutional:      General: He is not  in acute distress.    Appearance: He is ill-appearing.     Comments: Sitting upright in bed. Awake, communicative.   HENT:     Mouth/Throat:     Mouth: Mucous membranes are moist.     Pharynx: Oropharynx is clear. No oropharyngeal exudate.  Eyes:     General: No scleral icterus.    Pupils: Pupils are equal, round, and reactive to light.  Neck:     Comments: Trach in place, clean and dry dressing material. Some thin clear secretions w/o purulence.  Cardiovascular:     Rate and Rhythm: Normal rate and regular rhythm.     Pulses: Normal pulses.     Heart sounds: No murmur.  Pulmonary:     Effort: Pulmonary effort is normal.     Breath sounds: Rhonchi (R chest > L) present.     Comments: Overall diminished anteriorly and low inspiratory effort.  Abdominal:     General: There is distension.     Tenderness: There is abdominal tenderness (grimaces with moderate-deep palpation ).     Comments: Diarrhea noted in rectal tube  Genitourinary:    Comments: B/L groin puncture sites are well healed without drainage although a little tender to him.  Musculoskeletal:     Cervical back: Normal range of motion.  Skin:    General: Skin is warm and dry.     Capillary Refill: Capillary refill takes less than 2 seconds.      Comments: R chest surgical incision and staples clean and dry; sternotomy incision well healed and without signs of infections   Neurological:     Mental Status: He is alert and oriented to person, place, and time.  Psychiatric:        Behavior: Behavior normal.        Thought Content: Thought content normal.        Judgment: Judgment normal.     Lab Results Lab Results  Component Value Date   WBC 18.4 (H) 05/08/2019   HGB 9.9 (L) 05/08/2019   HCT 29.0 (L) 05/08/2019   MCV 93.6 05/08/2019   PLT 318 05/08/2019    Lab Results  Component Value Date   CREATININE 1.23 05/08/2019   BUN 29 (H) 05/08/2019   NA 137 05/08/2019   K 5.1 05/08/2019   CL 103 05/08/2019   CO2 25 05/08/2019    Lab Results  Component Value Date   ALT 125 (H) 05/08/2019   AST 61 (H) 05/08/2019   ALKPHOS 547 (H) 05/08/2019   BILITOT 1.5 (H) 05/08/2019     Microbiology: Recent Results (from the past 240 hour(s))  Culture, respiratory (non-expectorated)     Status: None   Collection Time: 04/28/19  7:40 PM   Specimen: Tracheal Aspirate; Respiratory  Result Value Ref Range Status   Specimen Description TRACHEAL ASPIRATE  Final   Special Requests NONE  Final   Gram Stain   Final    FEW WBC PRESENT, PREDOMINANTLY PMN NO ORGANISMS SEEN    Culture   Final    NO GROWTH 2 DAYS Performed at [facility], [address] [place], [place] [address]    Report Status 05/01/2019 FINAL  Final  Fungus Culture With Stain     Status: None (Preliminary result)   Collection Time: 05/05/2019 11:01 AM   Specimen: PATH Cytology Bronchial lavage; Respiratory  Result Value Ref Range Status   Fungus Stain Final report  Final    Comment: (NOTE) Performed At: [place], [place] [id number] Rush Farmer MD KA:[id number]    Fungus (Mycology) Culture PENDING  Incomplete   Fungal Source BRONCHIAL WASHINGS  Final  Culture, respiratory     Status: None   Collection Time: 05/07/2019  11:01 AM   Specimen: PATH Cytology Bronchial lavage; Respiratory  Result Value Ref Range Status   Specimen Description BRONCHIAL WASHINGS  Final   Special Requests NONE  Final   Gram Stain   Final    FEW WBC PRESENT,BOTH PMN AND MONONUCLEAR NO ORGANISMS SEEN    Culture   Final    NO GROWTH 2 DAYS Performed at [facility], [address] [place], [place] [address]    Report Status 05/05/2019 FINAL  Final  Fungus Stain     Status: None   Collection Time: 05/01/2019 11:01 AM   Specimen: PATH Cytology Bronchial lavage; Respiratory  Result Value Ref Range Status   FUNGUS STAIN REFERT  Final    Comment: (NOTE) Please refer to the following specimen for additional lab results.      Reference [phone] for [facility] results Performed At: [facility] [place], [place] [id number] Rush Farmer MD [id number]    Fungal Source BRONCHIAL WASHINGS  Final  Acid Fast Smear (AFB)     Status: None   Collection Time: 04/24/2019 11:01 AM   Specimen: PATH Cytology Bronchial lavage; Respiratory  Result Value Ref Range Status   AFB Specimen Processing Concentration  Final   Acid Fast Smear Negative  Final    Comment: (NOTE) Performed At: [facility] [place], [place] [id number] Rush Farmer MD UE:[id number]    Source (AFB) BRONCHIAL WASHINGS  Final  Fungus Culture Result     Status: None   Collection Time: 05/01/2019 11:01 AM  Result Value Ref Range Status   Result 1 Comment  Final    Comment: (NOTE) KOH/Calcofluor preparation:  no fungus observed. Performed At: [facility] [place], [place] [id number] Rush Farmer MD IA:[id number]   Culture, blood (Routine X 2) w Reflex to ID Panel     Status: None (Preliminary result)   Collection Time: 05/06/19 10:59 AM   Specimen: BLOOD  Result Value Ref Range Status   Specimen Description BLOOD LEFT ANTECUBITAL  Final   Special Requests   Final    BOTTLES DRAWN AEROBIC  AND ANAEROBIC Blood Culture adequate volume   Culture   Final    NO GROWTH 2 DAYS Performed at [facility], [place] [address], [place], [place] [address]    Report Status PENDING  Incomplete  Culture, blood (Routine X 2) w Reflex to ID Panel     Status: None (Preliminary result)   Collection Time: 05/06/19 11:08 AM   Specimen: BLOOD LEFT HAND  Result Value Ref Range Status   Specimen Description BLOOD LEFT HAND  Final   Special Requests   Final    BOTTLES DRAWN AEROBIC AND ANAEROBIC Blood Culture adequate volume   Culture   Final    NO GROWTH 2 DAYS Performed at [facility], [place] [address], [place], [place] [address]    Report Status PENDING  Incomplete  Culture, respiratory (non-expectorated)     Status: None (Preliminary result)   Collection Time: 05/06/19  3:50 PM   Specimen: Tracheal Aspirate; Respiratory  Result Value Ref Range Status   Specimen Description TRACHEAL ASPIRATE  Final   Special Requests Immunocompromised  Final   Gram Stain NO WBC  SEEN RARE YEAST   Final   Culture   Final    CULTURE REINCUBATED FOR BETTER GROWTH Performed at [facility], [place] [address], [place], [place] [address]    Report Status PENDING  Incomplete  Culture, respiratory (non-expectorated)     Status: None (Preliminary result)   Collection Time: 05/07/19 10:02 AM   Specimen: Tracheal Aspirate; Respiratory  Result Value Ref Range Status   Specimen Description TRACHEAL ASPIRATE  Final   Special Requests NONE  Final   Gram Stain   Final    RARE WBC PRESENT,BOTH PMN AND MONONUCLEAR RARE BUDDING YEAST SEEN    Culture   Final    CULTURE REINCUBATED FOR BETTER GROWTH Performed at [facility], [place] [address], [place], [place] [address]    Report Status PENDING  Incomplete    Janene Madeira, MSN, NP-C [place] [facility] [email or web address] Pager: [phone] Office: [phone] [place]:  [phone]

## 2019-05-08 NOTE — Procedures (Signed)
Admit: 03/30/2019 LOS: 24  21M dialysis dependent AKI; cardiogenic shock status post CABG + MV replacement 4/2  Current CRRT Prescription: Start Date: 04/26/19 Catheter: L  Temp HD Cath BFR: 200 Pre Blood Pump: 400 4K DFR: 1800 4K Replacement Rate: 200 4K Goal UF: 75-100/h net negative Anticoagulation: 106m/h hep fixed Clotting: infrequent ~1x/24h   S:  Back on Vent, ? Aspiration  K 5.1, P 3.0   No UOP, attempting UF  Stable req of NE, milrinone, VP gtt  O: 04/21 0701 - 04/22 0700 In: 1580.8 [I.V.:604.4; NG/GT:770; IV Piggyback:206.4] Out: 2670 [Emesis/NG output:71; Stool:200]  Filed Weights   05/06/19 0400 05/07/19 0447 05/08/19 0815  Weight: 61.6 kg 62.4 kg 63.3 kg    Recent Labs  Lab 05/07/19 0502 05/07/19 0540 05/07/19 1600 05/08/19 0500 05/08/19 0513  NA 137   < > 137 136 137  K 4.7   < > 4.7 4.9 5.1  CL 105  --  103 103  --   CO2 27  --  26 25  --   GLUCOSE 119*  --  131* 151*  --   BUN 24*  --  28* 29*  --   CREATININE 1.09  --  1.18 1.23  --   CALCIUM 7.7*  --  7.7* 7.6*  --   PHOS 2.5  --  3.0 3.0  --    < > = values in this interval not displayed.   Recent Labs  Lab 05/06/19 0314 05/06/19 0421 05/07/19 0502 05/07/19 0540 05/07/19 1234 05/08/19 0500 05/08/19 0513  WBC 20.0*  --  21.0*  --   --  18.4*  --   HGB 9.1*   < > 8.9*   < > 10.2* 8.4* 9.9*  HCT 29.3*   < > 29.2*   < > 30.0* 28.0* 29.0*  MCV 91.3  --  92.1  --   --  93.6  --   PLT 327  --  319  --   --  318  --    < > = values in this interval not displayed.    Scheduled Meds: . amiodarone  200 mg Per Tube Daily  . aspirin EC  325 mg Oral Daily   Or  . aspirin  324 mg Per Tube Daily  . B-complex with vitamin C  1 tablet Per Tube Daily  . bisacodyl  10 mg Oral Daily   Or  . bisacodyl  10 mg Rectal Daily  . chlorhexidine gluconate (MEDLINE KIT)  15 mL Mouth Rinse BID  . Chlorhexidine Gluconate Cloth  6 each Topical Daily  . docusate  200 mg Per Tube Daily  . feeding  supplement (PRO-STAT SUGAR FREE 64)  60 mL Per Tube BID  . gabapentin  300 mg Per Tube Q8H  . insulin aspart  0-24 Units Subcutaneous Q4H  . insulin aspart  4 Units Subcutaneous Q4H  . insulin glargine  10 Units Subcutaneous BID  . linezolid  600 mg Per Tube Q12H  . mouth rinse  15 mL Mouth Rinse 10 times per day  . metoCLOPramide (REGLAN) injection  5 mg Intravenous Q8H  . midodrine  15 mg Per Tube TID WC  . pantoprazole sodium  40 mg Per Tube Daily  . pneumococcal 23 valent vaccine  0.5 mL Intramuscular Tomorrow-1000  . sodium chloride flush  10-40 mL Intracatheter Q12H   Continuous Infusions: .  prismasol BGK 4/2.5 400 mL/hr at 05/07/19 2045  .  prismasol BGK 4/2.5 200  mL/hr at 05/08/19 0653  . sodium chloride 10 mL/hr at 05/06/19 0600  . epinephrine Stopped (04/28/19 0138)  . feeding supplement (VITAL 1.5 CAL) Stopped (05/07/19 1730)  . fentaNYL infusion INTRAVENOUS Stopped (04/30/19 1211)  . heparin 10,000 units/ 20 mL infusion syringe 1,000 Units/hr (05/08/19 0539)  . lactated ringers Stopped (05/09/2019 1640)  . lactated ringers Stopped (04/30/19 0930)  . milrinone 0.125 mcg/kg/min (05/08/19 0800)  . norepinephrine (LEVOPHED) Adult infusion 16 mcg/min (05/08/19 0800)  . piperacillin-tazobactam Stopped (05/08/19 9675)  . prismasol BGK 4/2.5 1,800 mL/hr at 05/08/19 0844  . vasopressin (PITRESSIN) infusion - *FOR SHOCK* 0.02 Units/min (05/08/19 0800)   PRN Meds:.Place/Maintain arterial line **AND** sodium chloride, alteplase, fentaNYL, fentaNYL (SUBLIMAZE) injection, heparin, levalbuterol, metoprolol tartrate, ondansetron (ZOFRAN) IV, ondansetron (ZOFRAN) IV, sodium chloride, sodium chloride flush  ABG    Component Value Date/Time   PHART 7.444 05/08/2019 0513   PCO2ART 39.4 05/08/2019 0513   PO2ART 59 (L) 05/08/2019 0513   HCO3 27.3 05/08/2019 0513   TCO2 29 05/08/2019 0513   ACIDBASEDEF 3.0 (H) 04/28/2019 0735   O2SAT 53.3 05/08/2019 0530    A/P  1. Dialysis dependent  AKI, anuric 2. Hypophosphatemia, from CRRT, req intermittent repletion if < 2.5 3. Enterococcus faecalis pneumonia; per primary / CCM 4. 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 Cardiogenic shock on inotrope 6. CAD status post CABG 7. Mitral regurgitation status post mitral valve repair 8. diabetes mellitus 2 9. Acute blood loss anemia; Hb stable 8-9s 10. Recurrent polymorphic ventricular tachycardia, on amiodarone  Cont all 4K baths.  Cont fixed dose hep in CRRT circuilt. Can't stop CRRT until off pressors or at least on much lower doses.    Pearson Grippe, MD [facility]

## 2019-05-08 NOTE — Progress Notes (Signed)
Hypoglycemic Event  CBG: 69  Treatment: D50 25 mL (12.5 gm)  Symptoms: None  Follow-up CBG: GBTD:[id number] CBG Result:117  Possible Reasons for Event: Other: TF paused briefly after vomiting; now resumed at lower rate  Comments/MD notified:protocol followed    Sherlyn Lees

## 2019-05-08 NOTE — Procedures (Signed)
Admit: 04/08/2019 LOS: 24  36M dialysis dependent AKI; cardiogenic shock status post CABG + MV replacement 4/2  Current CRRT Prescription: Start Date: 04/26/19 Catheter: L [place] Temp HD Cath BFR: 200 Pre Blood Pump: 400 4K DFR: 1800 4K Replacement Rate: 200 4K Goal UF: 75-100/h net negative Anticoagulation: 1030m/h hep fixed Clotting: infrequent ~1x/24h   S:  Back on Vent, ? Aspiration  K 5.1, P 3.0  No UOP  Stable req of NE, milrinone, VP gtt  O: 04/21 0701 - 04/22 0700 In: 1580.8 [I.V.:604.4; NG/GT:770; IV Piggyback:206.4] Out: 2670 [Emesis/NG output:71; Stool:200]  Filed Weights   05/05/19 0143 05/06/19 0400 05/07/19 0447  Weight: 61.9 kg 61.6 kg 62.4 kg    Recent Labs  Lab 05/07/19 0502 05/07/19 0540 05/07/19 1600 05/08/19 0500 05/08/19 0513  NA 137   < > 137 136 137  K 4.7   < > 4.7 4.9 5.1  CL 105  --  103 103  --   CO2 27  --  26 25  --   GLUCOSE 119*  --  131* 151*  --   BUN 24*  --  28* 29*  --   CREATININE 1.09  --  1.18 1.23  --   CALCIUM 7.7*  --  7.7* 7.6*  --   PHOS 2.5  --  3.0 3.0  --    < > = values in this interval not displayed.   Recent Labs  Lab 05/06/19 0314 05/06/19 0421 05/07/19 0502 05/07/19 0540 05/07/19 1234 05/08/19 0500 05/08/19 0513  WBC 20.0*  --  21.0*  --   --  18.4*  --   HGB 9.1*   < > 8.9*   < > 10.2* 8.4* 9.9*  HCT 29.3*   < > 29.2*   < > 30.0* 28.0* 29.0*  MCV 91.3  --  92.1  --   --  93.6  --   PLT 327  --  319  --   --  318  --    < > = values in this interval not displayed.    Scheduled Meds: . amiodarone  200 mg Per Tube Daily  . aspirin EC  325 mg Oral Daily   Or  . aspirin  324 mg Per Tube Daily  . B-complex with vitamin C  1 tablet Per Tube Daily  . bisacodyl  10 mg Oral Daily   Or  . bisacodyl  10 mg Rectal Daily  . chlorhexidine gluconate (MEDLINE KIT)  15 mL Mouth Rinse BID  . Chlorhexidine Gluconate Cloth  6 each Topical Daily  . docusate  200 mg Per Tube Daily  . feeding supplement  (PRO-STAT SUGAR FREE 64)  60 mL Per Tube BID  . gabapentin  300 mg Per Tube Q8H  . insulin aspart  0-24 Units Subcutaneous Q4H  . insulin aspart  4 Units Subcutaneous Q4H  . insulin glargine  10 Units Subcutaneous BID  . linezolid  600 mg Per Tube Q12H  . mouth rinse  15 mL Mouth Rinse 10 times per day  . metoCLOPramide (REGLAN) injection  5 mg Intravenous Q8H  . midodrine  15 mg Per Tube TID WC  . pantoprazole sodium  40 mg Per Tube Daily  . pneumococcal 23 valent vaccine  0.5 mL Intramuscular Tomorrow-1000  . sodium chloride flush  10-40 mL Intracatheter Q12H   Continuous Infusions: .  prismasol BGK 4/2.5 400 mL/hr at 05/07/19 2045  .  prismasol BGK 4/2.5 200 mL/hr at 05/08/19  4707  . sodium chloride 10 mL/hr at 05/06/19 0600  . epinephrine Stopped (04/28/19 0138)  . feeding supplement (VITAL 1.5 CAL) Stopped (05/07/19 1730)  . fentaNYL infusion INTRAVENOUS Stopped (04/30/19 1211)  . heparin 10,000 units/ 20 mL infusion syringe 1,000 Units/hr (05/08/19 0539)  . lactated ringers Stopped (04/24/2019 1640)  . lactated ringers Stopped (04/30/19 0930)  . milrinone 0.125 mcg/kg/min (05/08/19 0800)  . norepinephrine (LEVOPHED) Adult infusion 16 mcg/min (05/08/19 0800)  . piperacillin-tazobactam Stopped (05/08/19 6151)  . prismasol BGK 4/2.5 1,800 mL/hr at 05/08/19 0251  . vasopressin (PITRESSIN) infusion - *FOR SHOCK* 0.02 Units/min (05/08/19 0800)   PRN Meds:.Place/Maintain arterial line **AND** sodium chloride, alteplase, fentaNYL, fentaNYL (SUBLIMAZE) injection, heparin, levalbuterol, metoprolol tartrate, ondansetron (ZOFRAN) IV, ondansetron (ZOFRAN) IV, sodium chloride, sodium chloride flush  ABG    Component Value Date/Time   PHART 7.444 05/08/2019 0513   PCO2ART 39.4 05/08/2019 0513   PO2ART 59 (L) 05/08/2019 0513   HCO3 27.3 05/08/2019 0513   TCO2 29 05/08/2019 0513   ACIDBASEDEF 3.0 (H) 04/28/2019 0735   O2SAT 53.3 05/08/2019 0530    A/P  1. Dialysis dependent AKI,  anuric 2. Hypophosphatemia, from CRRT, req intermittent repletion if < 2.5 3. Enterococcus faecalis pneumonia; per primary / CCM 4. 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 Cardiogenic shock on inotrope 6. CAD status post CABG 7. Mitral regurgitation status post mitral valve repair 8. diabetes mellitus 2 9. Acute blood loss anemia; Hb stable 8-9s 10. Recurrent polymorphic ventricular tachycardia, on amiodarone  Cont all 4K baths.  Cont fixed dose hep in CRRT circuilt.   Pearson Grippe, MD [facility]

## 2019-05-08 NOTE — Progress Notes (Signed)
Echocardiogram 2D Echocardiogram has been performed.  Oneal Deputy Waldo Damian 05/08/2019, 10:27 AM

## 2019-05-09 ENCOUNTER — Inpatient Hospital Stay (HOSPITAL_COMMUNITY): Payer: Medicaid Other

## 2019-05-09 DIAGNOSIS — E43 Unspecified severe protein-calorie malnutrition: Secondary | ICD-10-CM

## 2019-05-09 LAB — COMPREHENSIVE METABOLIC PANEL
ALT: 123 U/L — ABNORMAL HIGH (ref 0–44)
AST: 81 U/L — ABNORMAL HIGH (ref 15–41)
Albumin: 1.8 g/dL — ABNORMAL LOW (ref 3.5–5.0)
Alkaline Phosphatase: 577 U/L — ABNORMAL HIGH (ref 38–126)
Anion gap: 7 (ref 5–15)
BUN: 29 mg/dL — ABNORMAL HIGH (ref 8–23)
CO2: 26 mmol/L (ref 22–32)
Calcium: 7.7 mg/dL — ABNORMAL LOW (ref 8.9–10.3)
Chloride: 102 mmol/L (ref 98–111)
Creatinine, Ser: 0.95 mg/dL (ref 0.61–1.24)
GFR calc Af Amer: >60 mL/min (ref >60)
GFR calc non Af Amer: >60 mL/min (ref >60)
Glucose, Bld: 101 mg/dL — ABNORMAL HIGH (ref 70–99)
Potassium: 4.6 mmol/L (ref 3.5–5.1)
Sodium: 135 mmol/L (ref 135–145)
Total Bilirubin: 1.2 mg/dL (ref 0.3–1.2)
Total Protein: 6.1 g/dL — ABNORMAL LOW (ref 6.5–8.1)

## 2019-05-09 LAB — RENAL FUNCTION PANEL
Albumin: 1.8 g/dL — ABNORMAL LOW (ref 3.5–5.0)
Anion gap: 7 (ref 5–15)
BUN: 29 mg/dL — ABNORMAL HIGH (ref 8–23)
CO2: 26 mmol/L (ref 22–32)
Calcium: 7.8 mg/dL — ABNORMAL LOW (ref 8.9–10.3)
Chloride: 103 mmol/L (ref 98–111)
Creatinine, Ser: 1.06 mg/dL (ref 0.61–1.24)
GFR calc Af Amer: >60 mL/min (ref >60)
GFR calc non Af Amer: >60 mL/min (ref >60)
Glucose, Bld: 224 mg/dL — ABNORMAL HIGH (ref 70–99)
Phosphorus: 2.8 mg/dL (ref 2.5–4.6)
Potassium: 4.3 mmol/L (ref 3.5–5.1)
Sodium: 136 mmol/L (ref 135–145)

## 2019-05-09 LAB — CBC
HCT: 27.6 % — ABNORMAL LOW (ref 39.0–52.0)
Hemoglobin: 8.1 g/dL — ABNORMAL LOW (ref 13.0–17.0)
MCH: 28.1 pg (ref 26.0–34.0)
MCHC: 29.3 g/dL — ABNORMAL LOW (ref 30.0–36.0)
MCV: 95.8 fL (ref 80.0–100.0)
Platelets: 339 10*3/uL (ref 150–400)
RBC: 2.88 MIL/uL — ABNORMAL LOW (ref 4.22–5.81)
RDW: 17 % — ABNORMAL HIGH (ref 11.5–15.5)
WBC: 15.8 10*3/uL — ABNORMAL HIGH (ref 4.0–10.5)
nRBC: 0 % (ref 0.0–0.2)

## 2019-05-09 LAB — POCT I-STAT 7, (LYTES, BLD GAS, ICA,H+H)
Acid-Base Excess: 2 mmol/L (ref 0.0–2.0)
Bicarbonate: 26.7 mmol/L (ref 20.0–28.0)
Calcium, Ion: 1.21 mmol/L (ref 1.15–1.40)
HCT: 29 % — ABNORMAL LOW (ref 39.0–52.0)
Hemoglobin: 9.9 g/dL — ABNORMAL LOW (ref 13.0–17.0)
O2 Saturation: 99 %
Patient temperature: 97.9
Potassium: 4.6 mmol/L (ref 3.5–5.1)
Sodium: 137 mmol/L (ref 135–145)
TCO2: 28 mmol/L (ref 22–32)
pCO2 arterial: 41.7 mmHg (ref 32.0–48.0)
pH, Arterial: 7.412 (ref 7.350–7.450)
pO2, Arterial: 142 mmHg — ABNORMAL HIGH (ref 83.0–108.0)

## 2019-05-09 LAB — GLUCOSE, CAPILLARY
Glucose-Capillary: 102 mg/dL — ABNORMAL HIGH (ref 70–99)
Glucose-Capillary: 105 mg/dL — ABNORMAL HIGH (ref 70–99)
Glucose-Capillary: 109 mg/dL — ABNORMAL HIGH (ref 70–99)
Glucose-Capillary: 116 mg/dL — ABNORMAL HIGH (ref 70–99)
Glucose-Capillary: 158 mg/dL — ABNORMAL HIGH (ref 70–99)
Glucose-Capillary: 193 mg/dL — ABNORMAL HIGH (ref 70–99)
Glucose-Capillary: 55 mg/dL — ABNORMAL LOW (ref 70–99)
Glucose-Capillary: 90 mg/dL (ref 70–99)

## 2019-05-09 LAB — CULTURE, RESPIRATORY W GRAM STAIN

## 2019-05-09 LAB — PROCALCITONIN: Procalcitonin: 1.69 ng/mL

## 2019-05-09 LAB — MAGNESIUM: Magnesium: 2.6 mg/dL — ABNORMAL HIGH (ref 1.7–2.4)

## 2019-05-09 LAB — PHOSPHORUS: Phosphorus: 2.9 mg/dL (ref 2.5–4.6)

## 2019-05-09 LAB — COOXEMETRY PANEL
Carboxyhemoglobin: 1.4 % (ref 0.5–1.5)
Methemoglobin: 1.2 % (ref 0.0–1.5)
O2 Saturation: 59 %
Total hemoglobin: 9.6 g/dL — ABNORMAL LOW (ref 12.0–16.0)

## 2019-05-09 MED ORDER — DEXTROSE 50 % IV SOLN: 12.5000 g | INTRAVENOUS | Status: AC

## 2019-05-09 MED ORDER — OXYCODONE HCL 20 MG/ML PO CONC
5.0000 mg | ORAL | Status: DC | PRN
  Administered 2019-05-09 – 2019-05-11 (×11): 5 mg via ORAL
  Filled 2019-05-09 (×11): qty 1

## 2019-05-09 NOTE — Progress Notes (Signed)
7 Days Post-Op Procedure(s) (LRB): VIDEO BRONCHOSCOPY USING DISPOSABLE ANESTHESIA SCOPE (N/A) TRACHEOSTOMY (N/A) Subjective: Patient alert and appears more comfortable Oxygenation improved with removal of fluid was CRT Chest x-ray aeration improved Appreciate ID evaluation of patient and recommendations Because significant improvement in oxygenation will attempt transition from pressure support to trach collar trials We will ask PT to see to increase mobility.  Objective: Vital signs in last 24 hours: Temp:  [97.7 F (36.5 C)-98 F (36.7 C)] 98 F (36.7 C) (04/23 0400) Pulse Rate:  [72-85] 77 (04/23 0800) Cardiac Rhythm: Normal sinus rhythm (04/23 0400) Resp:  [11-24] 13 (04/23 0800) BP: (77-124)/(41-99) 103/57 (04/23 0800) SpO2:  [98 %-100 %] 100 % (04/23 0800) FiO2 (%):  [40 %-60 %] 40 % (04/23 0500) Weight:  [57.7 kg] 57.7 kg (04/23 0318)  Hemodynamic parameters for last 24 hours: CVP:  [9 mmHg-12 mmHg] 11 mmHg  Intake/Output from previous day: 04/22 0701 - 04/23 0700 In: 1479.3 [I.V.:600; NG/GT:679.3; IV Piggyback:200] Out: 4113 [Stool:600] Intake/Output this shift: Total I/O In: 70 [I.V.:25; NG/GT:45] Out: 142 [Other:142]  Alert on vent through trach Scattered rhonchi No murmur, sinus rhythm off pacemaker Sternal incision with superficial skin breakdown but intact Extremities pink well perfused No focal neuro deficit  Lab Results: Recent Labs    05/08/19 0500 05/08/19 0513 05/09/19 0343 05/09/19 0529  WBC 18.4*  --  15.8*  --   HGB 8.4*   < > 8.1* 9.9*  HCT 28.0*   < > 27.6* 29.0*  PLT 318  --  339  --    < > = values in this interval not displayed.   BMET:  Recent Labs    05/08/19 1730 05/08/19 1730 05/09/19 0343 05/09/19 0529  NA 137   < > 135 137  K 4.8   < > 4.6 4.6  CL 101  --  102  --   CO2 26  --  26  --   GLUCOSE 147*  --  101*  --   BUN 27*  --  29*  --   CREATININE 1.06  --  0.95  --   CALCIUM 8.1*  --  7.7*  --    < > = values in  this interval not displayed.    PT/INR: No results for input(s): LABPROT, INR in the last 72 hours. ABG    Component Value Date/Time   PHART 7.412 05/09/2019 0529   HCO3 26.7 05/09/2019 0529   TCO2 28 05/09/2019 0529   ACIDBASEDEF 3.0 (H) 04/28/2019 0735   O2SAT 99.0 05/09/2019 0529   CBG (last 3)  Recent Labs    05/09/19 0008 05/09/19 0350 05/09/19 0811  GLUCAP 116* 90 158*    Assessment/Plan: S/P Procedure(s) (LRB): VIDEO BRONCHOSCOPY USING DISPOSABLE ANESTHESIA SCOPE (N/A) TRACHEOSTOMY (N/A) Temporary pacemaker to VVI backup Transition to trach collar trials today We will need PT input for mobilization, sit on side of bed and get to chair Continue antibiotics as recommended by ID Wean norepinephrine slowly to maintain systolic blood pressure greater than 100 mmHg  LOS: 25 days    [name] 05/09/2019

## 2019-05-09 NOTE — Procedures (Signed)
Admit: 03/30/2019 LOS: 25  60M dialysis dependent AKI; cardiogenic shock status post CABG + MV replacement 4/2  Current CRRT Prescription: Start Date: 04/26/19 Catheter: L Sheldon Temp HD Cath BFR: 200 Pre Blood Pump: 400 4K DFR: 1800 4K Replacement Rate: 200 4K Goal UF: 75-100/h net negative Anticoagulation: 106m/h hep fixed Clotting: infrequent ~1x/24h   S:  TC this AM, seems more awake/alert  K 4.6, P 2.9  No UOP, attempting UF  Stable req of NE, milrinone, VP gtt  O: 04/22 0701 - 04/23 0700 In: 1479.3 [I.V.:600; NG/GT:679.3; IV Piggyback:200] Out: 4113 [Stool:600]  Filed Weights   05/07/19 0447 05/08/19 0815 05/09/19 0318  Weight: 62.4 kg 63.3 kg 57.7 kg    Recent Labs  Lab 05/08/19 0500 05/08/19 0513 05/08/19 1730 05/09/19 0343 05/09/19 0529  NA 136   < > 137 135 137  K 4.9   < > 4.8 4.6 4.6  CL 103  --  101 102  --   CO2 25  --  26 26  --   GLUCOSE 151*  --  147* 101*  --   BUN 29*  --  27* 29*  --   CREATININE 1.23  --  1.06 0.95  --   CALCIUM 7.6*  --  8.1* 7.7*  --   PHOS 3.0  --  2.8 2.9  --    < > = values in this interval not displayed.   Recent Labs  Lab 05/07/19 0502 05/07/19 0540 05/08/19 0500 05/08/19 0500 05/08/19 0513 05/09/19 0343 05/09/19 0529  WBC 21.0*  --  18.4*  --   --  15.8*  --   HGB 8.9*   < > 8.4*   < > 9.9* 8.1* 9.9*  HCT 29.2*   < > 28.0*   < > 29.0* 27.6* 29.0*  MCV 92.1  --  93.6  --   --  95.8  --   PLT 319  --  318  --   --  339  --    < > = values in this interval not displayed.    Scheduled Meds: . amiodarone  200 mg Per Tube Daily  . aspirin EC  325 mg Oral Daily   Or  . aspirin  324 mg Per Tube Daily  . B-complex with vitamin C  1 tablet Per Tube Daily  . bisacodyl  10 mg Oral Daily   Or  . bisacodyl  10 mg Rectal Daily  . chlorhexidine gluconate (MEDLINE KIT)  15 mL Mouth Rinse BID  . Chlorhexidine Gluconate Cloth  6 each Topical Daily  . docusate  200 mg Per Tube Daily  . feeding supplement  (PRO-STAT SUGAR FREE 64)  60 mL Oral Q6H  . gabapentin  300 mg Per Tube Q8H  . insulin aspart  0-24 Units Subcutaneous Q4H  . insulin aspart  4 Units Subcutaneous Q4H  . insulin glargine  10 Units Subcutaneous BID  . mouth rinse  15 mL Mouth Rinse 10 times per day  . metoCLOPramide (REGLAN) injection  10 mg Intravenous Q8H  . midodrine  15 mg Per Tube TID WC  . pantoprazole sodium  40 mg Per Tube Daily  . pneumococcal 23 valent vaccine  0.5 mL Intramuscular Tomorrow-1000  . sodium chloride flush  10-40 mL Intracatheter Q12H   Continuous Infusions: .  prismasol BGK 4/2.5 400 mL/hr at 05/09/19 0151  .  prismasol BGK 4/2.5 200 mL/hr at 05/09/19 0934  . sodium chloride 10 mL/hr at 05/06/19  0600  . epinephrine Stopped (04/28/19 0138)  . feeding supplement (VITAL 1.5 CAL) 1,000 mL (05/09/19 0314)  . fentaNYL infusion INTRAVENOUS Stopped (04/30/19 1211)  . heparin 10,000 units/ 20 mL infusion syringe 1,000 Units/hr (05/09/19 0130)  . lactated ringers Stopped (04/26/2019 1640)  . lactated ringers Stopped (04/30/19 0930)  . milrinone 0.125 mcg/kg/min (05/09/19 1100)  . norepinephrine (LEVOPHED) Adult infusion 15 mcg/min (05/09/19 1100)  . piperacillin-tazobactam Stopped (05/09/19 0532)  . prismasol BGK 4/2.5 1,800 mL/hr at 05/09/19 1037  . vasopressin (PITRESSIN) infusion - *FOR SHOCK* 0.02 Units/min (05/09/19 1100)   PRN Meds:.Place/Maintain arterial line **AND** sodium chloride, alteplase, fentaNYL, fentaNYL (SUBLIMAZE) injection, heparin, levalbuterol, ondansetron (ZOFRAN) IV, ondansetron (ZOFRAN) IV, oxyCODONE, sodium chloride, sodium chloride flush  ABG    Component Value Date/Time   PHART 7.412 05/09/2019 0529   PCO2ART 41.7 05/09/2019 0529   PO2ART 142 (H) 05/09/2019 0529   HCO3 26.7 05/09/2019 0529   TCO2 28 05/09/2019 0529   ACIDBASEDEF 3.0 (H) 04/28/2019 0735   O2SAT 99.0 05/09/2019 0529    A/P  1. Dialysis dependent AKI, anuric 2. Hypophosphatemia, from CRRT, req  intermittent repletion if < 2.5 3. Enterococcus faecalis pneumonia; per primary / CCM / RCID 4. 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 Cardiogenic shock on inotrope 6. CAD status post CABG 7. Mitral regurgitation status post mitral valve repair 8. diabetes mellitus 2 9. Acute blood loss anemia; Hb stable 8-9s 10. Recurrent polymorphic ventricular tachycardia, on amiodarone  Cont all 4K baths.  Cont fixed dose hep in CRRT circuilt.   Pearson Grippe, MD [facility]

## 2019-05-09 NOTE — Progress Notes (Signed)
Pharmacy Antibiotic Note  Brendan [name] is a 64 y.o. male s/p CABG/MVR 4/6, on CRRT.  Pharmacy had been consulted for Zosyn for PNA and possible aspiration event after VT arrest. WBC improving to 15.8. Afebrile. ID recommends to not treat yeast in trach culture and stop linezolid.  Plan: Zosyn 3.375 g IV q6h while on CRRT Monitor CRRT tolerance/interruptions, clinical progress, LOT, repeat cultures   Height: _0  (180.3 cm)(measured x 3) Weight: 57.7 kg (127 lb 3.3 oz) IBW/kg (Calculated) : 75.3  Temp (24hrs), Avg:97.8 F (36.6 C), Min:97.6 F (36.4 C), Max:98.1 F (36.7 C)  Recent Labs  Lab 05/05/19 0332 05/05/19 1606 05/06/19 0314 05/06/19 1557 05/07/19 0502 05/07/19 1600 05/08/19 0500 05/08/19 1730 05/09/19 0343  WBC 17.8*  --  20.0*  --  21.0*  --  18.4*  --  15.8*  CREATININE 1.16  1.17   < > 1.16   < > 1.09 1.18 1.23 1.06 0.95   < > = values in this interval not displayed.    Estimated Creatinine Clearance: 66.6 mL/min (by C-G formula based on SCr of 0.95 mg/dL).    Allergies  Allergen Reactions  . Acetaminophen Itching   Cefepime 4/4>4/14 Vanc 4/4>4/4; resume 4/12 > 4/15 Rocephin 4/4 > 4/6 Ampicillin 4/12>4/12 Unasyn 4/16>4/20 Linezolid 4/20>4/23 Zosyn 4/20>  4/2 R pleural fluid > neg 4/4 BCx x 2 > neg 4/9 BCx - negative 4/9 Wound Cx > rare enterococcus faecalis (pan-S) 4/9 UCx >  > 100K yeast 4/19 BAL - rare E.faecalis (pan sensitive) 4/12 TA > ngF 4/16 BAL > ngF 4/20 TA > rare yeast 4/20 BCx > ngtd 4/21 TA > rare yeast  Vertis Kelch, PharmD, [facility] PGY2 Cardiology Pharmacy Resident Phone [phone] 05/09/2019       12:22 PM  Please check [email or web address] for unit-specific pharmacist phone numbers

## 2019-05-09 NOTE — Progress Notes (Signed)
[facility] DAILY ICU PROGRESS NOTE                   [name]            [place] [address]          [phone]   7 Days Post-Op Procedure(s) (LRB): VIDEO BRONCHOSCOPY USING DISPOSABLE ANESTHESIA SCOPE (N/A) TRACHEOSTOMY (N/A)  Total Length of Stay:  LOS: 25 days   Subjective: Pt awake and alert this am. He denies abdominal pain, nausea, or any vomiting. On CVVHD this am  Objective: Vital signs in last 24 hours: Temp:  [97.7 F (36.5 C)-98 F (36.7 C)] 98 F (36.7 C) (04/23 0400) Pulse Rate:  [72-85] 73 (04/23 0700) Cardiac Rhythm: Normal sinus rhythm (04/23 0400) Resp:  [11-24] 15 (04/23 0700) BP: (77-124)/(41-99) 100/72 (04/23 0700) SpO2:  [98 %-100 %] 100 % (04/23 0700) FiO2 (%):  [40 %-60 %] 40 % (04/23 0500) Weight:  [57.7 kg-63.3 kg] 57.7 kg (04/23 0318)  Filed Weights   05/07/19 0447 05/08/19 0815 05/09/19 0318  Weight: 62.4 kg 63.3 kg 57.7 kg    Weight change:    Hemodynamic parameters for last 24 hours: CVP:  [9 mmHg-15 mmHg] 11 mmHg  Intake/Output from previous day: 04/22 0701 - 04/23 0700 In: 1479.3 [I.V.:600; NG/GT:679.3; IV Piggyback:200] Out: 8546 [Stool:600]  Intake/Output this shift: No intake/output data recorded.  Current Meds: Scheduled Meds: . amiodarone  200 mg Per Tube Daily  . aspirin EC  325 mg Oral Daily   Or  . aspirin  324 mg Per Tube Daily  . B-complex with vitamin C  1 tablet Per Tube Daily  . bisacodyl  10 mg Oral Daily   Or  . bisacodyl  10 mg Rectal Daily  . chlorhexidine gluconate (MEDLINE KIT)  15 mL Mouth Rinse BID  . Chlorhexidine Gluconate Cloth  6 each Topical Daily  . docusate  200 mg Per Tube Daily  . feeding supplement (PRO-STAT SUGAR FREE 64)  60 mL Oral Q6H  . gabapentin  300 mg Per Tube Q8H  . insulin aspart  0-24 Units Subcutaneous Q4H  . insulin aspart  4 Units Subcutaneous Q4H  . insulin glargine  10 Units Subcutaneous BID  . linezolid  600 mg Per Tube Q12H  . mouth rinse  15 mL Mouth  Rinse 10 times per day  . metoCLOPramide (REGLAN) injection  10 mg Intravenous Q8H  . midodrine  15 mg Per Tube TID WC  . pantoprazole sodium  40 mg Per Tube Daily  . pneumococcal 23 valent vaccine  0.5 mL Intramuscular Tomorrow-1000  . sodium chloride flush  10-40 mL Intracatheter Q12H   Continuous Infusions: .  prismasol BGK 4/2.5 400 mL/hr at 05/09/19 0151  .  prismasol BGK 4/2.5 200 mL/hr at 05/08/19 0653  . sodium chloride 10 mL/hr at 05/06/19 0600  . epinephrine Stopped (04/28/19 0138)  . feeding supplement (VITAL 1.5 CAL) 1,000 mL (05/09/19 0314)  . fentaNYL infusion INTRAVENOUS Stopped (04/30/19 1211)  . heparin 10,000 units/ 20 mL infusion syringe 1,000 Units/hr (05/09/19 0130)  . lactated ringers Stopped (04/19/2019 1640)  . lactated ringers Stopped (04/30/19 0930)  . milrinone 0.125 mcg/kg/min (05/09/19 0700)  . norepinephrine (LEVOPHED) Adult infusion 16 mcg/min (05/09/19 0700)  . piperacillin-tazobactam Stopped (05/09/19 0532)  . prismasol BGK 4/2.5 1,800 mL/hr at 05/09/19 0456  . vasopressin (PITRESSIN) infusion - *FOR SHOCK* 0.02 Units/min (05/09/19 0700)   PRN Meds:.Place/Maintain arterial line **AND** sodium chloride, alteplase,  fentaNYL, fentaNYL (SUBLIMAZE) injection, heparin, levalbuterol, ondansetron (ZOFRAN) IV, ondansetron (ZOFRAN) IV, sodium chloride, sodium chloride flush  Heart: RRR Lungs: Slightly diminished bibasilar breath sounds;clearer than yesterday's exam Abdomen: Soft, non tender,  bowel sounds present Extremities: Mild LE edema ( mid shin downward) and feet have are edematous Wound: Sternal dressing is clean and dry;right axillary wound is clean and dry. Of note, the most inferior portion of sternal wound has superficial dehiscence with some sero sanguinous drainage but no sign of infection. He also has had some bloody like drainage from left chest tube wound.  Lab Results: CBC: Recent Labs    05/08/19 0500 05/08/19 0513 05/09/19 0343  05/09/19 0529  WBC 18.4*  --  15.8*  --   HGB 8.4*   < > 8.1* 9.9*  HCT 28.0*   < > 27.6* 29.0*  PLT 318  --  339  --    < > = values in this interval not displayed.   BMET:  Recent Labs    05/08/19 1730 05/08/19 1730 05/09/19 0343 05/09/19 0529  NA 137   < > 135 137  K 4.8   < > 4.6 4.6  CL 101  --  102  --   CO2 26  --  26  --   GLUCOSE 147*  --  101*  --   BUN 27*  --  29*  --   CREATININE 1.06  --  0.95  --   CALCIUM 8.1*  --  7.7*  --    < > = values in this interval not displayed.    CMET: Lab Results  Component Value Date   WBC 15.8 (H) 05/09/2019   HGB 9.9 (L) 05/09/2019   HCT 29.0 (L) 05/09/2019   PLT 339 05/09/2019   GLUCOSE 101 (H) 05/09/2019   CHOL 174 08/21/2017   TRIG 143 08/21/2017   HDL 57 08/21/2017   LDLCALC 88 08/21/2017   ALT 123 (H) 05/09/2019   AST 81 (H) 05/09/2019   NA 137 05/09/2019   K 4.6 05/09/2019   CL 102 05/09/2019   CREATININE 0.95 05/09/2019   BUN 29 (H) 05/09/2019   CO2 26 05/09/2019   TSH 0.925 04/02/2019   INR 1.6 (H) 05/01/2019   HGBA1C 9.0 (H) 04/08/2019   MICROALBUR 30 08/21/2017    PT/INR:  No results for input(s): LABPROT, INR in the last 72 hours. Radiology: ECHOCARDIOGRAM LIMITED  Result Date: 05/08/2019    ECHOCARDIOGRAM LIMITED REPORT   Patient Name:   Brendan [name] Date of Exam: 05/08/2019 Medical Rec #:  [id number]        Height:       71.0 in Accession #:    [id number]       Weight:       139.6 lb Date of Birth:  1955/02/28        BSA:          1.810 m Patient Age:    64 years         BP:           90/62 mmHg Patient Gender: M                HR:           53 bpm. Exam Location:  Inpatient Procedure: Limited Echo, Color Doppler and Cardiac Doppler Indications:    Hypotension  History:        Patient has prior history of Echocardiogram examinations, most  recent 04/26/2019. CHF, NSTEMI, Prior CABG; Risk                 Factors:Diabetes. CABG and MV repair on 04/23/2019, Impella in place                  from 4/2-4/9.  Sonographer:    Raquel Sarna Senior RDCS Referring Phys: Agency  1. Left ventricular ejection fraction, by estimation, is 30 to 35%. The left ventricle has moderately decreased function. The left ventricle demonstrates regional wall motion abnormalities, akinesis of the basal to mid inferoseptal, inferior, and inferolateral walls. No LV thrombus noted. Left ventricular diastolic function could not be evaluated.  2. Right ventricular systolic function is mildly reduced. The right ventricular size is normal. Tricuspid regurgitation signal is inadequate for assessing PA pressure.  3. Status post mitral valve repair. No significant regurgitation or stenosis.  4. The aortic valve is tricuspid. Mild aortic valve sclerosis is present, with no evidence of aortic valve stenosis.  5. The inferior vena cava is normal in size with <50% respiratory variability, suggesting right atrial pressure of 8 mmHg. FINDINGS  Left Ventricle: Left ventricular ejection fraction, by estimation, is 30 to 35%. The left ventricle has moderately decreased function. The left ventricle demonstrates regional wall motion abnormalities. The left ventricular internal cavity size was small. There is no left ventricular hypertrophy. Right Ventricle: The right ventricular size is normal. No increase in right ventricular wall thickness. Right ventricular systolic function is mildly reduced. Tricuspid regurgitation signal is inadequate for assessing PA pressure. Mitral Valve: Status post mitral valve repair. No significant regurgitation or stenosis. MV peak gradient, 8.4 mmHg. The mean mitral valve gradient is 3.0 mmHg. Aortic Valve: The aortic valve is tricuspid. Mild aortic valve sclerosis is present, with no evidence of aortic valve stenosis. Venous: The inferior vena cava is normal in size with less than 50% respiratory variability, suggesting right atrial pressure of 8 mmHg.   LV Volumes (MOD) LV vol d, MOD A2C:  142.0 ml LV vol d, MOD A4C: 145.0 ml LV vol s, MOD A2C: 95.1 ml LV vol s, MOD A4C: 95.6 ml LV SV MOD A2C:     46.9 ml LV SV MOD A4C:     145.0 ml LV SV MOD BP:      51.3 ml RIGHT VENTRICLE RV S prime:     5.10 cm/s TAPSE (M-mode): 0.9 cm AORTIC VALVE LVOT Vmax:   88.45 cm/s LVOT Vmean:  58.900 cm/s LVOT VTI:    0.160 m MITRAL VALVE MV Peak grad: 8.4 mmHg  SHUNTS MV Mean grad: 3.0 mmHg  Systemic VTI: 0.16 m MV Vmax:      1.45 m/s MV Vmean:     69.9 cm/s Loralie Champagne MD Electronically signed by Loralie Champagne MD Signature Date/Time: 05/08/2019/3:09:26 PM    Final     Assessment/Plan: S/P Procedure(s) (LRB): VIDEO BRONCHOSCOPY USING DISPOSABLE ANESTHESIA SCOPE (N/A) TRACHEOSTOMY (N/A)  1. CV-S/p removal of Impella on 04/09.  S/p V tach arrest 04/15. Previous a fib. SR with HR in the 70's this am.  He was paced yesterday (to help with CO but unable to pace after 9 pm-? If wires detached so will discuss if able to remove EPW with surgeon). On Amiodarone 200 mg daily and Midodrine 15 mg tid. Also, on Nor epinephrine drip at 16 mcg/min, Milrinone 0.125 mcg/kg/min, and Vasopressin .02 units/min drips. Weaning drips as able.  Co ox this am decreased to 53.3 2. Pulmonary-S/p trach  04/16. ABG results this am noted.On trach collar during the day and ventilator at night but has been requiring more ventilator last 24 hours secondary to worsening hypoxia. FiO2% 40. CXR this am appears stable (diffuse bilateral pulmonary infiltrates/edema but no pneumothorax.) 3. Expected post op blood loss anemia-H and H this am decreased to 8.1 and 27.6 4. DM-CBGs 145/116/90. On Insulin. He was on Insulin and Metformin 1000 mg bid prior to surgery. Continue on Insulin for now as creatinine elevated and NPO. Pre op HGA1C 9. He will need close medical follow up after discharge 5. AKI-Anuric. Creatinine 0.95 this am. Nephrology following and arranging for CVVHD accordingly 6. GI-severe malnutrition of chronic illness.  Cortrak, TFs.  Speech pathology evaluation done yesterday and recommendations to be followed accordingly. Albumin remains 1.8 7. Elevated transaminases (likely related to shock)-AST 81, ALT 123, ALk phos 577.  T bili decreased to 1.2 this am 8. ID-Leukocytosis decreased to WBC to 15,800 (likely related to aspiration). Pro calcitonin this am 1.69.Yeast now in sputum. On Zyvox and Zosyn for broader coverage. Infectious disease consulted yesterday. Of note, he completed Fluconazole for Candida in urine. Completed Vanco and Cefepime for Enterococcus Faecalis.  9. Acute systolic heart failure-CVVHD helping with volume status 10. Extremely deconditioned-will need PT as able  Nani Skillern PA-C 05/09/2019 7:14 AM

## 2019-05-09 NOTE — Progress Notes (Signed)
Patient ID: [name], male   DOB: [birth year], 64 y.o.   MRN: [id number] [facility] Evening Rounds:  Hemodynamically stable but on Milrinone 0.125, NE 15, Vaso 0.02  Trach collar today.  CRRT  BMET    Component Value Date/Time   NA 136 05/09/2019 1620   K 4.3 05/09/2019 1620   CL 103 05/09/2019 1620   CO2 26 05/09/2019 1620   GLUCOSE 224 (H) 05/09/2019 1620   BUN 29 (H) 05/09/2019 1620   CREATININE 1.06 05/09/2019 1620   CALCIUM 7.8 (L) 05/09/2019 1620   GFRNONAA >60 05/09/2019 1620   GFRAA >60 05/09/2019 1620

## 2019-05-09 NOTE — Progress Notes (Signed)
Patient ID: Brendan [name], male   DOB: Jan 28, 1955, 64 y.o.   MRN: [id number]     Advanced Heart Failure Rounding Note  PCP-Cardiologist: No primary care provider on file.   Subjective:    Events - Presented to [facility] with acute HF  EF 20-25% - [facility]red [facility] - Cath 3/30 with severe 3 vessel disease. EF 25% - Developed PMVT on milrinone -> milrinone stopped -> developed worsening shock -> IABP placed on 4/1 - Clinical deterioration 4/1-> intubated - 4/2 underwent Impella 5.5 placement earlier and bilateral chest tubes with 3L out from each side.  - Extubated 4/4 - 05/01/2019 CABG and MVR - Extubated 4/7 - Back to OR for Impella 5.5 extraction 4/9, extubated.  TEE with EF 35%, RV ok, trivial MR s/p MV repair.  - CVVH started 4/10 with oliguria, rising creatinine and CVP.  - Deteriorating respiratory status, intubated again early am 4/12.  Enterococcus faecalis in sputum.  - Extubated 4/14.  Developed respiratory distress and started on Bipap.  Had VT arrest with CPR/epinephrine, intubated again 4/15 early am.  - Tracheostomy 4/16 - Atrial fibrillation early am 4/17  Remains on vent through trach. Respiratory status improved this am. CXR slightly better. Secretions decreasing. Placed back on TC.  Asking for IV pain meds frequently.   Remains on zosyn/linezolid. ID has seen and recommended not treating yeast in sputum at this point. Tm 98.0 WBC 18 -> 15k.   Remains on CVVHD. Pulling 75 ml/hr, but limited by hypotension. CVP down to 5 this am   Remains on NE 16, vasopressin 0.03 and milrinone 0.125. Co-ox 59%  Echo 4/22: EF 30-35% RV mildly reduced. MVR looks good.   Echo (4/10): EF 25-30%, normal RV, no significant MR.  Objective:   Weight Range: 57.7 kg Body mass index is 17.74 kg/m.   Vital Signs:   Temp:  [97.7 F (36.5 C)-98 F (36.7 C)] 98 F (36.7 C) (04/23 0400) Pulse Rate:  [72-85] 76 (04/23 0824) Resp:  [11-24] 18 (04/23 0824) BP: (77-124)/(41-99) 105/58  (04/23 0824) SpO2:  [98 %-100 %] 99 % (04/23 0824) FiO2 (%):  [40 %-60 %] 40 % (04/23 0824) Weight:  [57.7 kg] 57.7 kg (04/23 0318) Last BM Date: 05/08/19  Weight change: Filed Weights   05/07/19 0447 05/08/19 0815 05/09/19 0318  Weight: 62.4 kg 63.3 kg 57.7 kg    Intake/Output:   Intake/Output Summary (Last 24 hours) at 05/09/2019 0828 Last data filed at 05/09/2019 0800 Gross per 24 hour  Intake 1474.13 ml  Output 4088 ml  Net -2613.87 ml      Physical Exam    CVP 5 General:  Thin chronically ill appearing, thin/cachetic WM, Sitting up in bed. On vent through TC HEENT: normal + [place] Neck: supple. no JVD.  +trach Carotids 2+ bilat; no bruits. No lymphadenopathy or thryomegaly appreciated. Cor: PMI nondisplaced. Regular rate & rhythm. No rubs, gallops or murmurs.Incisions and CT ok. [place] trialysis Lungs: mildly coarse but improved Abdomen: soft, nontender, nondistended. No hepatosplenomegaly. No bruits or masses. Good bowel sounds. Extremities: no cyanosis, clubbing, rash, edema Neuro: alert & orientedx3, cranial nerves grossly intact. moves all 4 extremities w/o difficulty. Affect pleasant   Telemetry   Paced rhythm 80.  Personally reviewed   Labs    CBC Recent Labs    05/08/19 0500 05/08/19 0513 05/09/19 0343 05/09/19 0529  WBC 18.4*  --  15.8*  --   HGB 8.4*   < > 8.1* 9.9*  HCT 28.0*   < >  27.6* 29.0*  MCV 93.6  --  95.8  --   PLT 318  --  339  --    < > = values in this interval not displayed.   Basic Metabolic Panel Recent Labs    05/08/19 0500 05/08/19 0513 05/08/19 1730 05/08/19 1730 05/09/19 0343 05/09/19 0529  NA 136   < > 137   < > 135 137  K 4.9   < > 4.8   < > 4.6 4.6  CL 103   < > 101  --  102  --   CO2 25   < > 26  --  26  --   GLUCOSE 151*   < > 147*  --  101*  --   BUN 29*   < > 27*  --  29*  --   CREATININE 1.23   < > 1.06  --  0.95  --   CALCIUM 7.6*   < > 8.1*  --  7.7*  --   MG 2.6*  --   --   --  2.6*  --   PHOS 3.0   <  > 2.8  --  2.9  --    < > = values in this interval not displayed.   Liver Function Tests Recent Labs    05/08/19 0500 05/08/19 0500 05/08/19 1730 05/09/19 0343  AST 61*  --   --  81*  ALT 125*  --   --  123*  ALKPHOS 547*  --   --  577*  BILITOT 1.5*  --   --  1.2  PROT 6.1*  --   --  6.1*  ALBUMIN 1.8*   < > 1.9* 1.8*   < > = values in this interval not displayed.   Recent Labs    05/07/19 1817  AMYLASE 58   Cardiac Enzymes No results for input(s): CKTOTAL, CKMB, CKMBINDEX, TROPONINI in the last 72 hours.  BNP: BNP (last 3 results) Recent Labs    03/26/2019 0113  BNP 655.7*    ProBNP (last 3 results) No results for input(s): PROBNP in the last 8760 hours.   D-Dimer No results for input(s): DDIMER in the last 72 hours. Hemoglobin A1C No results for input(s): HGBA1C in the last 72 hours. Fasting Lipid Panel No results for input(s): CHOL, HDL, LDLCALC, TRIG, CHOLHDL, LDLDIRECT in the last 72 hours. Thyroid Function Tests No results for input(s): TSH, T4TOTAL, T3FREE, THYROIDAB in the last 72 hours.  Invalid input(s): FREET3  Other results:   Imaging    ECHOCARDIOGRAM LIMITED  Result Date: 05/08/2019    ECHOCARDIOGRAM LIMITED REPORT   Patient Name:   BILL YOHN Date of Exam: 05/08/2019 Medical Rec #:  [id number]        Height:       71.0 in Accession #:    [id number]       Weight:       139.6 lb Date of Birth:  March 11, 1955        BSA:          1.810 m Patient Age:    76 years         BP:           90/62 mmHg Patient Gender: M                HR:           53 bpm. Exam Location:  Inpatient Procedure: Limited Echo, Color Doppler and Cardiac Doppler  Indications:    Hypotension  History:        Patient has prior history of Echocardiogram examinations, most                 recent 04/26/2019. CHF, NSTEMI, Prior CABG; Risk                 Factors:Diabetes. CABG and MV repair on 04/23/2019, Impella in place                 from 4/2-4/9.  Sonographer:    Raquel Sarna Senior RDCS  Referring Phys: [place]  1. Left ventricular ejection fraction, by estimation, is 30 to 35%. The left ventricle has moderately decreased function. The left ventricle demonstrates regional wall motion abnormalities, akinesis of the basal to mid inferoseptal, inferior, and inferolateral walls. No LV thrombus noted. Left ventricular diastolic function could not be evaluated.  2. Right ventricular systolic function is mildly reduced. The right ventricular size is normal. Tricuspid regurgitation signal is inadequate for assessing PA pressure.  3. Status post mitral valve repair. No significant regurgitation or stenosis.  4. The aortic valve is tricuspid. Mild aortic valve sclerosis is present, with no evidence of aortic valve stenosis.  5. The inferior vena cava is normal in size with <50% respiratory variability, suggesting right atrial pressure of 8 mmHg. FINDINGS  Left Ventricle: Left ventricular ejection fraction, by estimation, is 30 to 35%. The left ventricle has moderately decreased function. The left ventricle demonstrates regional wall motion abnormalities. The left ventricular internal cavity size was small. There is no left ventricular hypertrophy. Right Ventricle: The right ventricular size is normal. No increase in right ventricular wall thickness. Right ventricular systolic function is mildly reduced. Tricuspid regurgitation signal is inadequate for assessing PA pressure. Mitral Valve: Status post mitral valve repair. No significant regurgitation or stenosis. MV peak gradient, 8.4 mmHg. The mean mitral valve gradient is 3.0 mmHg. Aortic Valve: The aortic valve is tricuspid. Mild aortic valve sclerosis is present, with no evidence of aortic valve stenosis. Venous: The inferior vena cava is normal in size with less than 50% respiratory variability, suggesting right atrial pressure of 8 mmHg.   LV Volumes (MOD) LV vol d, MOD A2C: 142.0 ml LV vol d, MOD A4C: 145.0 ml LV vol s, MOD  A2C: 95.1 ml LV vol s, MOD A4C: 95.6 ml LV SV MOD A2C:     46.9 ml LV SV MOD A4C:     145.0 ml LV SV MOD BP:      51.3 ml RIGHT VENTRICLE RV S prime:     5.10 cm/s TAPSE (M-mode): 0.9 cm AORTIC VALVE LVOT Vmax:   88.45 cm/s LVOT Vmean:  58.900 cm/s LVOT VTI:    0.160 m MITRAL VALVE MV Peak grad: 8.4 mmHg  SHUNTS MV Mean grad: 3.0 mmHg  Systemic VTI: 0.16 m MV Vmax:      1.45 m/s MV Vmean:     69.9 cm/s Loralie Champagne MD Electronically signed by Loralie Champagne MD Signature Date/Time: 05/08/2019/3:09:26 PM    Final      Medications:     Scheduled Medications: . amiodarone  200 mg Per Tube Daily  . aspirin EC  325 mg Oral Daily   Or  . aspirin  324 mg Per Tube Daily  . B-complex with vitamin C  1 tablet Per Tube Daily  . bisacodyl  10 mg Oral Daily   Or  . bisacodyl  10 mg Rectal Daily  .  chlorhexidine gluconate (MEDLINE KIT)  15 mL Mouth Rinse BID  . Chlorhexidine Gluconate Cloth  6 each Topical Daily  . docusate  200 mg Per Tube Daily  . feeding supplement (PRO-STAT SUGAR FREE 64)  60 mL Oral Q6H  . gabapentin  300 mg Per Tube Q8H  . insulin aspart  0-24 Units Subcutaneous Q4H  . insulin aspart  4 Units Subcutaneous Q4H  . insulin glargine  10 Units Subcutaneous BID  . linezolid  600 mg Per Tube Q12H  . mouth rinse  15 mL Mouth Rinse 10 times per day  . metoCLOPramide (REGLAN) injection  10 mg Intravenous Q8H  . midodrine  15 mg Per Tube TID WC  . pantoprazole sodium  40 mg Per Tube Daily  . pneumococcal 23 valent vaccine  0.5 mL Intramuscular Tomorrow-1000  . sodium chloride flush  10-40 mL Intracatheter Q12H    Infusions: .  prismasol BGK 4/2.5 400 mL/hr at 05/09/19 0151  .  prismasol BGK 4/2.5 200 mL/hr at 05/08/19 0653  . sodium chloride 10 mL/hr at 05/06/19 0600  . epinephrine Stopped (04/28/19 0138)  . feeding supplement (VITAL 1.5 CAL) 1,000 mL (05/09/19 0314)  . fentaNYL infusion INTRAVENOUS Stopped (04/30/19 1211)  . heparin 10,000 units/ 20 mL infusion syringe 1,000  Units/hr (05/09/19 0130)  . lactated ringers Stopped (05/12/2019 1640)  . lactated ringers Stopped (04/30/19 0930)  . milrinone 0.125 mcg/kg/min (05/09/19 0800)  . norepinephrine (LEVOPHED) Adult infusion 16 mcg/min (05/09/19 0800)  . piperacillin-tazobactam Stopped (05/09/19 0532)  . prismasol BGK 4/2.5 1,800 mL/hr at 05/09/19 0749  . vasopressin (PITRESSIN) infusion - *FOR SHOCK* 0.02 Units/min (05/09/19 0800)    PRN Medications: Place/Maintain arterial line **AND** sodium chloride, alteplase, fentaNYL, fentaNYL (SUBLIMAZE) injection, heparin, levalbuterol, ondansetron (ZOFRAN) IV, ondansetron (ZOFRAN) IV, oxyCODONE, sodium chloride, sodium chloride flush     Assessment/Plan    1. Acute systolic HF ->  Cardiogenic Shock  - Due to iCM. EF 20-25% - Impella 5.5 placed on 4/2.   - s/p CABG/MVRepair on 4/6  - Impella out 4/9.  - Echo 4/10 with EF 25-30%, normal RV, no MR.  - Echo 4/22 EF 30-35% mildly reduced RV MVR ok   - Now on CVVH. CVP 5. Keep even today - Remains on norepinephrine 16 + vasopressin 0.02 + milrinone 0.125. On midodrine 15 tid. Co-ox improved to 59% Mean NE as tolerated. May need to drop milrinone  2. CAD - LHC with severe 3 vessel CAD  - No s/s angina - s/p CABG x 3 MV Repair 4/6. Echo stable - on ASA/Crestor  3. Acute hypoxic respiratory failure - Due to pulmonary edema and PNA - Extubated 4/9 re-intubated on 4/12. Failed re-extubation on 4/14. Now s/p tracheostomy - CVP 5. Continue CVVH. Keep even today - Was on Unasyn for facaelis PNA, Now on zosyn/linezolid - CXR improving. Secretions decreasing. Will reattempt TC trials - ID recommends not treating yeast on sputum cx just yet (suspect colonization) - PCT up slightly  - CCM following.   4. Mitral Regurgitation - Mod-severe on ECHO - s/p MV repair, TEE 4/9 with minimal MR s/p repair.   - Echo 4/10 with no significant MR.  - ech 4/22 MVR stable  5. Uncontrolled DM - Hgb A1C 9.  - On insulin and  sliding scale.  - No change  6. AKI - due to shock - Became anuric and now on CVVH to control volume overload.  Management as above  7. Acute blood loss Anemia  -  HGb 8.1 today  8. Polymorphic VT - Recurrent VT during respiratory distress 4/14, - Now on po amio - no further VT on tele - Keep K> 4.0 Mg > 2.0  - no change today  9. 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 No sensation R and L foot and occasionaly in his hands. Requires assistance with ADLs.   10. Severe Malnutrition - Prealbumin 8.9  - Nutrition on board - Continue w/ TFs  11. Elevated LFTs - Suspect shock liver - AST/ALT remain stably elevated.  12. Atrial fibrillation. paroxysmal - Now back in NSR. On po amio  13. Debility, severe - will need SNF on d/c  14. Chronic pain issues - d/w CCM - will try to limit IV fentanyl push and get enteral pain meds on board  CRITICAL CARE Performed by: Glori Bickers  Total critical care time: 35 minutes  Critical care time was exclusive of separately billable procedures and treating other patients.  Critical care was necessary to treat or prevent imminent or life-threatening deterioration.  Critical care was time spent personally by me (independent of midlevel providers or residents) on the following activities: development of treatment plan with patient and/or surrogate as well as nursing, discussions with consultants, evaluation of patient's response to treatment, examination of patient, obtaining history from patient or surrogate, ordering and performing treatments and interventions, ordering and review of laboratory studies, ordering and review of radiographic studies, pulse oximetry and re-evaluation of patient's condition.  Glori Bickers, MD  8:28 AM

## 2019-05-09 NOTE — Progress Notes (Signed)
Hypoglycemic Event  CBG: 55   Treatment: D50 25 mL (12.5 gm)  Symptoms: None  Follow-up CBG: Time:1150 CBG Result:109  Possible Reasons for Event: Unknown  Comments/MD notified: will discuss patient's insulin orders with MD during rounds or if another hypoglycemia event occurs.     Brendan [name]

## 2019-05-09 NOTE — Progress Notes (Signed)
Inpatient Rehab Admissions Coordinator Note:   Per PT recommendations, pt was screened for CIR candidacy by Shann Medal, PT, DPT.  At this time note pt on CRRT.  Will continue to follow from a distance.  Please contact me with questions.   Shann Medal, PT, DPT [phone] 05/09/19 4:56 PM

## 2019-05-09 NOTE — Progress Notes (Signed)
SLP Cancellation Note  Patient Details Name: Brendan [name] MRN: [id number] DOB: Oct 12, 1955   Cancelled treatment:       Reason Eval/Treat Not Completed: Patient at procedure or test/unavailable(Pt with physical therapy at this time. SLP will f/u)  Tobie Poet I. Hardin Negus, [place], [place] Office number [phone] Pager [place] 05/09/2019, 10:28 AM

## 2019-05-09 NOTE — Progress Notes (Signed)
Patient removed from Vent and placed on 40% ATC. Vitals stable at this time. Patient is tolerating well. RN at bedside. RT will continue to monitor.

## 2019-05-09 NOTE — Evaluation (Signed)
Physical Therapy Evaluation Patient Details Name: Brendan [name] MRN: [id number] DOB: 1955/10/15 Today's Date: 05/09/2019   History of Present Illness  Pt presented with SOB and fatigue at [facility] found to have new HFrEF,  NSTEMI, and bilateral pleural effusions transferred to [facility] on 3/29 for further cardiac evaluation.  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  On the evening of 4/1, patient developed worsening respiratory distress and hypoxia, head R/L heart cath on 3/30 & R heart cath on 4/1 pt intubated on 4/1, extubated 4/4, had R & L chest tubes placed 4/2 due to bilateral pleural effusions, removed 4/5 and impella device placed 4/2.  CABG and MVR on 4/6. Extubated 4/7. Removal of impella 4/9. CVVH started 4/10. Respiratory distress and reintubated 4/12. Extubated 4/14. Reintubated 4/15. Trach 4/16.  PMHx of Tobacco abuse, HTN, poorly controlled DM, diabetic neuropathy  Clinical Impression  Pt re-evaluated by PT after multiple medical complications. Pt presents with significant decline in mobility but is motivated to work toward incr independence. Recommend CIR before return home.     Follow Up Recommendations CIR;Supervision/Assistance - 24 hour    Equipment Recommendations  Other (comment)(To be determined)    Recommendations for Other Services       Precautions / Restrictions Precautions Precautions: Fall;Sternal(CRRT, trach)      Mobility  Bed Mobility Overal bed mobility: Needs Assistance Bed Mobility: Supine to Sit;Sit to Supine     Supine to sit: +2 for physical assistance;Mod assist;HOB elevated Sit to supine: +2 for physical assistance;Mod assist   General bed mobility comments: Assist to bring legs off of bed, elevate trunk into sitting, and bring hips to EOB. Assist to bring legs back into bed and lower trunk returning to supine  Transfers                 General transfer comment: did not attempt. Pt lightheaded and fatigued sitting EOB  Ambulation/Gait                Stairs            Wheelchair Mobility    Modified Rankin (Stroke Patients Only)       Balance Overall balance assessment: Needs assistance Sitting-balance support: Bilateral upper extremity supported;Feet supported Sitting balance-Leahy Scale: Poor Sitting balance - Comments: Sat EOB x 10 minutes with min to min guard assist Postural control: Posterior lean                                   Pertinent Vitals/Pain Pain Assessment: Faces Faces Pain Scale: No hurt    Home Living Family/patient expects to be discharged to:: Private residence Living Arrangements: Spouse/significant other Available Help at Discharge: Family Type of Home: Apartment Home Access: Level entry     Home Layout: One level Home Equipment: Environmental consultant - 2 wheels;Cane - single point      Prior Function Level of Independence: Independent         Comments: wife did all IADL's, except pt was able to drive     Hand Dominance   Dominant Hand: Right    Extremity/Trunk Assessment   Upper Extremity Assessment Upper Extremity Assessment: Defer to OT evaluation    Lower Extremity Assessment Lower Extremity Assessment: RLE deficits/detail;LLE deficits/detail RLE Deficits / Details: grossly 3-/5 RLE Sensation: history of peripheral neuropathy LLE Deficits / Details: grossly 3-/5 LLE Sensation: history of peripheral neuropathy    Cervical / Trunk Assessment  Cervical / Trunk Assessment: Kyphotic  Communication   Communication: Tracheostomy  Cognition Arousal/Alertness: Awake/alert Behavior During Therapy: WFL for tasks assessed/performed Overall Cognitive Status: Difficult to assess                                 General Comments: Following 1 step commands and nodding, shaking head appropiately      General Comments General comments (skin integrity, edema, etc.): Pt on trach collar and CRRT. VSS.     Exercises     Assessment/Plan    PT  Assessment Patient needs continued PT services  PT Problem List Decreased strength;Decreased activity tolerance;Decreased balance;Decreased mobility       PT Treatment Interventions DME instruction;Gait training;Functional mobility training;Therapeutic activities;Therapeutic exercise;Balance training;Patient/family education    PT Goals (Current goals can be found in the Care Plan section)  Acute Rehab PT Goals Patient Stated Goal: not stated PT Goal Formulation: With patient Time For Goal Achievement: 05/23/19 Potential to Achieve Goals: Good    Frequency Min 3X/week   Barriers to discharge        Co-evaluation               AM-PAC PT "6 Clicks" Mobility  Outcome Measure Help needed turning from your back to your side while in a flat bed without using bedrails?: A Lot Help needed moving from lying on your back to sitting on the side of a flat bed without using bedrails?: A Lot Help needed moving to and from a bed to a chair (including a wheelchair)?: Total Help needed standing up from a chair using your arms (e.g., wheelchair or bedside chair)?: Total Help needed to walk in hospital room?: Total Help needed climbing 3-5 steps with a railing? : Total 6 Click Score: 8    End of Session   Activity Tolerance: Patient limited by fatigue Patient left: in bed;with call bell/phone within reach;with nursing/sitter in room Nurse Communication: Mobility status(nurse present for mobility) PT Visit Diagnosis: Other abnormalities of gait and mobility (R26.89);Muscle weakness (generalized) (M62.81)    Time: 3428-7681 PT Time Calculation (min) (ACUTE ONLY): 23 min   Charges:   PT Evaluation $PT Eval High Complexity: 1 High PT Treatments $Therapeutic Activity: 8-22 mins        [place] Pager [phone] Office [place] 05/09/2019, 4:34 PM

## 2019-05-09 NOTE — Progress Notes (Signed)
[facility]   Reason for visit: Follow up on yeast in trach aspirate  Interval History: trach aspirate with Candida albicans, WBC down to 15.8.  Remains afebrile.  Currently on trach collar.     Physical Exam: Constitutional:  Vitals:   05/09/19 1245 05/09/19 1300  BP: (!) 102/56 (!) 94/48  Pulse: 71 70  Resp: 18 15  Temp:    SpO2: 96% 96%   patient appears in NAD HENT: + trach Respiratory: Normal respiratory effort; CTA B Cardiovascular: RRR GI: soft, nt, nd  Review of Systems: Constitutional: negative for fevers and chills Gastrointestinal: negative for nausea and diarrhea  Lab Results  Component Value Date   WBC 15.8 (H) 05/09/2019   HGB 9.9 (L) 05/09/2019   HCT 29.0 (L) 05/09/2019   MCV 95.8 05/09/2019   PLT 339 05/09/2019    Lab Results  Component Value Date   CREATININE 0.95 05/09/2019   BUN 29 (H) 05/09/2019   NA 137 05/09/2019   K 4.6 05/09/2019   CL 102 05/09/2019   CO2 26 05/09/2019    Lab Results  Component Value Date   ALT 123 (H) 05/09/2019   AST 81 (H) 05/09/2019   ALKPHOS 577 (H) 05/09/2019     Microbiology: Recent Results (from the past 240 hour(s))  Fungus Culture With Stain     Status: None (Preliminary result)   Collection Time: 04/27/2019 11:01 AM   Specimen: PATH Cytology Bronchial lavage; Respiratory  Result Value Ref Range Status   Fungus Stain Final report  Final    Comment: (NOTE) Performed At: [facility] [place], [place] [id number] Rush Farmer MD YY:[id number]    Fungus (Mycology) Culture PENDING  Incomplete   Fungal Source BRONCHIAL WASHINGS  Final  Culture, respiratory     Status: None   Collection Time: 04/24/2019 11:01 AM   Specimen: PATH Cytology Bronchial lavage; Respiratory  Result Value Ref Range Status   Specimen Description BRONCHIAL WASHINGS  Final   Special Requests NONE  Final   Gram Stain   Final    FEW WBC PRESENT,BOTH PMN AND MONONUCLEAR NO  ORGANISMS SEEN    Culture   Final    NO GROWTH 2 DAYS Performed at [facility], [address], [place], [place] [address]    Report Status 05/05/2019 FINAL  Final  Fungus Stain     Status: None   Collection Time: 05/16/2019 11:01 AM   Specimen: PATH Cytology Bronchial lavage; Respiratory  Result Value Ref Range Status   FUNGUS STAIN REFERT  Final    Comment: (NOTE) Please refer to the following specimen for additional lab results.      Reference [phone] for [facility] results Performed At: [facility] [place], [place] [id number] Rush Farmer MD ZL:[id number]    Fungal Source BRONCHIAL WASHINGS  Final  Acid Fast Smear (AFB)     Status: None   Collection Time: 04/24/2019 11:01 AM   Specimen: PATH Cytology Bronchial lavage; Respiratory  Result Value Ref Range Status   AFB Specimen Processing Concentration  Final   Acid Fast Smear Negative  Final    Comment: (NOTE) Performed At: [facility] [place], [place] [id number] Rush Farmer MD AQ:[id number]    Source (AFB) BRONCHIAL WASHINGS  Final  Fungus Culture Result     Status: None   Collection Time: 04/28/2019 11:01 AM  Result Value Ref Range Status   Result 1 Comment  Final  Comment: (NOTE) KOH/Calcofluor preparation:  no fungus observed. Performed At: [facility] [place], [place] [id number] Rush Farmer MD VZ:[id number]   Culture, blood (Routine X 2) w Reflex to ID Panel     Status: None (Preliminary result)   Collection Time: 05/06/19 10:59 AM   Specimen: BLOOD  Result Value Ref Range Status   Specimen Description BLOOD LEFT ANTECUBITAL  Final   Special Requests   Final    BOTTLES DRAWN AEROBIC AND ANAEROBIC Blood Culture adequate volume   Culture   Final    NO GROWTH 2 DAYS Performed at [facility], [place] [address] [place], [place] [address]    Report Status PENDING  Incomplete  Culture, blood (Routine X 2) w Reflex to ID Panel      Status: None (Preliminary result)   Collection Time: 05/06/19 11:08 AM   Specimen: BLOOD LEFT HAND  Result Value Ref Range Status   Specimen Description BLOOD LEFT HAND  Final   Special Requests   Final    BOTTLES DRAWN AEROBIC AND ANAEROBIC Blood Culture adequate volume   Culture   Final    NO GROWTH 2 DAYS Performed at [facility], [place] [address], [place], [place] [address]    Report Status PENDING  Incomplete  Culture, respiratory (non-expectorated)     Status: None (Preliminary result)   Collection Time: 05/06/19  3:50 PM   Specimen: Tracheal Aspirate; Respiratory  Result Value Ref Range Status   Specimen Description TRACHEAL ASPIRATE  Final   Special Requests Immunocompromised  Final   Gram Stain NO WBC SEEN RARE YEAST   Final   Culture   Final    CULTURE REINCUBATED FOR BETTER GROWTH Performed at [facility], [place] [address] [place], [place] [address]    Report Status PENDING  Incomplete  Culture, respiratory (non-expectorated)     Status: None   Collection Time: 05/07/19 10:02 AM   Specimen: Tracheal Aspirate; Respiratory  Result Value Ref Range Status   Specimen Description TRACHEAL ASPIRATE  Final   Special Requests NONE  Final   Gram Stain   Final    RARE WBC PRESENT,BOTH PMN AND MONONUCLEAR RARE BUDDING YEAST SEEN Performed at [facility], [place] [address], [place], [place] [address]    Culture FEW CANDIDA ALBICANS  Final   Report Status 05/09/2019 FINAL  Final    Impression/Plan:  1. Yeast in trach aspirate - growing Candida albicans as is typical and no indication for treatment.    2.  Leukocytosis - improving and the patient is clinically improved as well.  Negative MRSA in the nasal swab and has had a prolonged treatment for the Enterococcus previous found. I will stop the linezolid. Continue with Zosyn for 7 days for pneumonia.  CXR stable and patient clinically responding.   I will sign off, call with any questions.

## 2019-05-09 NOTE — Progress Notes (Signed)
  Speech Language Pathology Treatment: Nada Boozer Speaking valve;Dysphagia  Patient Details Name: Brendan [name] MRN: [id number] DOB: 02-26-1955 Today's Date: 05/09/2019 Time: 5456-2563 SLP Time Calculation (min) (ACUTE ONLY): 28 min  Assessment / Plan / Recommendation Clinical Impression   Pt was alert and coopertive throughout the session and his family was present. Vitals were RR 18, SpO2 92, and HR 73 at baseline and RR 18, SpO2 95, and HR 73 following cuff deflation. He tolerated cuff deflation well and was able to expectorate secretion orally and via trach. He tolerated PMSV for 25 minutes with vitals range RR 15-18, SpO2 94-100, and HR 72-73 during this period with some report of dyspnea but no evidence of air trapping. Vocal quality was intermittently wet but improved with coughing and vocal quality was mildly breathy. Vocal intensity was reduced and pt required verbal prompts to increase vocal intensity. Pt tolerated ice chips without overt s/sx of aspiration but exhibited coughing with thin liquids via tsp, suggesting possible aspiration. Silent aspiration was noted during the FEES so the possibility of coughing being unrelated or pt's laryngeal sensation being improved are both considered. Pt's case was discussed with Lujean Rave, RN and it was agreed that the pt's cuff would be left down at the end of the session due to the stability of his vitals and that she would continue to monitor pt. It is recommended that the pt's NPO status be maintained but pt may have ice chips following oral care once PMSV is in place. SLP will continue to follow.    HPI HPI: 89 yoM originally presented with SOB and fatigue at [facility] found to have new HFrEF, NSTEMI, and bilateral pleural effusions transferred to [facility] on 3/29 for further cardiac evaluation. 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 Started on lasix and milrinone gtts however developed NSVT. Was taken 4/1 for placement of IABP and swan for optimization prior  to CABG. Was on precedex for agitation/ confusion, some concern for DTs. On the evening of 4/1, patient developed worsening respiratory distress and hypoxia, PCCM consulted for intubation and vent management. He was successfully extubated on 4/4 and PCCM signed off. Impella was removed 4/11 and patient noted to have increased WOB throughout the day he was placed on Bipap, but mental status continued to worsen requiring reintubation. Found pulseless 4/14 VT requiring 1 minute of CPR and epinephrine to achieve ROSC.  Eventual trach placement 4/16.       SLP Plan  Continue with current plan of care       Recommendations  Diet recommendations: NPO Medication Administration: Via alternative means      Patient may use Passy-Muir Speech Valve: Intermittently with supervision(with full staff supervision) PMSV Supervision: Full         Oral Care Recommendations: Oral care QID Follow up Recommendations: Skilled Nursing facility SLP Visit Diagnosis: Aphonia (R49.1);Dysphagia, oropharyngeal phase (R13.12) Plan: Continue with current plan of care       Mattias Walmsley I. Hardin Negus, [place], [place] Office number [phone] Pager [place] 05/09/2019, 5:48 PM

## 2019-05-10 ENCOUNTER — Inpatient Hospital Stay (HOSPITAL_COMMUNITY): Payer: Medicaid Other

## 2019-05-10 LAB — CBC
HCT: 26.9 % — ABNORMAL LOW (ref 39.0–52.0)
Hemoglobin: 7.9 g/dL — ABNORMAL LOW (ref 13.0–17.0)
MCH: 28.7 pg (ref 26.0–34.0)
MCHC: 29.4 g/dL — ABNORMAL LOW (ref 30.0–36.0)
MCV: 97.8 fL (ref 80.0–100.0)
Platelets: 350 10*3/uL (ref 150–400)
RBC: 2.75 MIL/uL — ABNORMAL LOW (ref 4.22–5.81)
RDW: 17.2 % — ABNORMAL HIGH (ref 11.5–15.5)
WBC: 15.6 10*3/uL — ABNORMAL HIGH (ref 4.0–10.5)
nRBC: 0 % (ref 0.0–0.2)

## 2019-05-10 LAB — POCT I-STAT 7, (LYTES, BLD GAS, ICA,H+H)
Acid-Base Excess: 2 mmol/L (ref 0.0–2.0)
Bicarbonate: 27 mmol/L (ref 20.0–28.0)
Calcium, Ion: 1.22 mmol/L (ref 1.15–1.40)
HCT: 27 % — ABNORMAL LOW (ref 39.0–52.0)
Hemoglobin: 9.2 g/dL — ABNORMAL LOW (ref 13.0–17.0)
O2 Saturation: 96 %
Patient temperature: 97.9
Potassium: 4.5 mmol/L (ref 3.5–5.1)
Sodium: 136 mmol/L (ref 135–145)
TCO2: 28 mmol/L (ref 22–32)
pCO2 arterial: 43.4 mmHg (ref 32.0–48.0)
pH, Arterial: 7.4 (ref 7.350–7.450)
pO2, Arterial: 84 mmHg (ref 83.0–108.0)

## 2019-05-10 LAB — COMPREHENSIVE METABOLIC PANEL
ALT: 100 U/L — ABNORMAL HIGH (ref 0–44)
AST: 64 U/L — ABNORMAL HIGH (ref 15–41)
Albumin: 1.8 g/dL — ABNORMAL LOW (ref 3.5–5.0)
Alkaline Phosphatase: 584 U/L — ABNORMAL HIGH (ref 38–126)
Anion gap: 9 (ref 5–15)
BUN: 29 mg/dL — ABNORMAL HIGH (ref 8–23)
CO2: 24 mmol/L (ref 22–32)
Calcium: 7.7 mg/dL — ABNORMAL LOW (ref 8.9–10.3)
Chloride: 102 mmol/L (ref 98–111)
Creatinine, Ser: 1 mg/dL (ref 0.61–1.24)
GFR calc Af Amer: >60 mL/min (ref >60)
GFR calc non Af Amer: >60 mL/min (ref >60)
Glucose, Bld: 158 mg/dL — ABNORMAL HIGH (ref 70–99)
Potassium: 4.4 mmol/L (ref 3.5–5.1)
Sodium: 135 mmol/L (ref 135–145)
Total Bilirubin: 1.3 mg/dL — ABNORMAL HIGH (ref 0.3–1.2)
Total Protein: 6.2 g/dL — ABNORMAL LOW (ref 6.5–8.1)

## 2019-05-10 LAB — GLUCOSE, CAPILLARY
Glucose-Capillary: 144 mg/dL — ABNORMAL HIGH (ref 70–99)
Glucose-Capillary: 103 mg/dL — ABNORMAL HIGH (ref 70–99)
Glucose-Capillary: 106 mg/dL — ABNORMAL HIGH (ref 70–99)
Glucose-Capillary: 101 mg/dL — ABNORMAL HIGH (ref 70–99)
Glucose-Capillary: 161 mg/dL — ABNORMAL HIGH (ref 70–99)
Glucose-Capillary: 195 mg/dL — ABNORMAL HIGH (ref 70–99)

## 2019-05-10 LAB — CULTURE, RESPIRATORY W GRAM STAIN: Gram Stain: NONE SEEN

## 2019-05-10 LAB — RENAL FUNCTION PANEL
Albumin: 1.7 g/dL — ABNORMAL LOW (ref 3.5–5.0)
Anion gap: 7 (ref 5–15)
BUN: 31 mg/dL — ABNORMAL HIGH (ref 8–23)
CO2: 25 mmol/L (ref 22–32)
Calcium: 7.8 mg/dL — ABNORMAL LOW (ref 8.9–10.3)
Chloride: 102 mmol/L (ref 98–111)
Creatinine, Ser: 0.97 mg/dL (ref 0.61–1.24)
GFR calc Af Amer: >60 mL/min (ref >60)
GFR calc non Af Amer: >60 mL/min (ref >60)
Glucose, Bld: 150 mg/dL — ABNORMAL HIGH (ref 70–99)
Phosphorus: 2.9 mg/dL (ref 2.5–4.6)
Potassium: 3.8 mmol/L (ref 3.5–5.1)
Sodium: 134 mmol/L — ABNORMAL LOW (ref 135–145)

## 2019-05-10 LAB — COOXEMETRY PANEL
Carboxyhemoglobin: 1.4 % (ref 0.5–1.5)
Methemoglobin: 0.7 % (ref 0.0–1.5)
O2 Saturation: 55.9 %
Total hemoglobin: 13.8 g/dL (ref 12.0–16.0)

## 2019-05-10 LAB — MAGNESIUM: Magnesium: 2.5 mg/dL — ABNORMAL HIGH (ref 1.7–2.4)

## 2019-05-10 LAB — PHOSPHORUS: Phosphorus: 2.8 mg/dL (ref 2.5–4.6)

## 2019-05-10 NOTE — Progress Notes (Signed)
Patient ID: ROSIE GOLSON, male   DOB: Jan 24, 1955, 64 y.o.   MRN: [id number]     Advanced Heart Failure Rounding Note  PCP-Cardiologist: No primary care provider on file.   Subjective:    Events - Presented to [facility] with acute HF  EF 20-25% - [facility] - Cath 3/30 with severe 3 vessel disease. EF 25% - Developed PMVT on milrinone -> milrinone stopped -> developed worsening shock -> IABP placed on 4/1 - Clinical deterioration 4/1-> intubated - 4/2 underwent Impella 5.5 placement earlier and bilateral chest tubes with 3L out from each side.  - Extubated 4/4 - 04/19/2019 CABG and MVR - Extubated 4/7 - Back to OR for Impella 5.5 extraction 4/9, extubated.  TEE with EF 35%, RV ok, trivial MR s/p MV repair.  - CVVH started 4/10 with oliguria, rising creatinine and CVP.  - Deteriorating respiratory status, intubated again early am 4/12.  Enterococcus faecalis in sputum.  - Extubated 4/14.  Developed respiratory distress and started on Bipap.  Had VT arrest with CPR/epinephrine, intubated again 4/15 early am.  - Tracheostomy 4/16 - Atrial fibrillation early am 4/17   Secretions improved. Back on TC. Breathing ok. CXR still with diffuse infiltrates.   Remains on zosyn/linezolid. ID has seen and recommended not treating yeast in sputum at this point. Tm 97.9 WBC stable at 15k.   Remains on CVVHD. Now even. CVP 10-11  Remains on NE 15, vasopressin 0.03 and milrinone 0.125. Co-ox 56%  Echo 4/22: EF 30-35% RV mildly reduced. MVR looks good.   Echo (4/10): EF 25-30%, normal RV, no significant MR.  Objective:   Weight Range: 57.4 kg Body mass index is 17.65 kg/m.   Vital Signs:   Temp:  [96.1 F (35.6 C)-97.9 F (36.6 C)] 97.6 F (36.4 C) (04/24 1200) Pulse Rate:  [68-146] 70 (04/24 1200) Resp:  [10-23] 11 (04/24 1200) BP: (87-115)/(44-68) 110/62 (04/24 1200) SpO2:  [92 %-100 %] 96 % (04/24 1200) FiO2 (%):  [40 %] 40 % (04/24 1201) Weight:  [57.4 kg] 57.4 kg (04/24  0415) Last BM Date: 05/08/19  Weight change: Filed Weights   05/09/19 0318 05/10/19 0045 05/10/19 0415  Weight: 57.7 kg 57.4 kg 57.4 kg    Intake/Output:   Intake/Output Summary (Last 24 hours) at 05/10/2019 1245 Last data filed at 05/10/2019 1200 Gross per 24 hour  Intake 2333.96 ml  Output 2422 ml  Net -88.04 ml      Physical Exam    CVP 10-11 General:  Thin chronically ill appearing, thin/cachetic WM. On TC HEENT: normal + Cor-trak Neck: supple. JVP 10 + trach Carotids 2+ bilat; no bruits. No lymphadenopathy or thryomegaly appreciated. Cor: PMI nondisplaced. Regular rate & rhythm. No rubs, gallops or murmurs. Incisions ok Lungs: coarse Abdomen: soft, nontender, nondistended. No hepatosplenomegaly. No bruits or masses. Good bowel sounds. Extremities: no cyanosis, clubbing, rash, tr edema  cachetic Neuro: alert & orientedx3, cranial nerves grossly intact. moves all 4 extremities w/o difficulty. Affect pleasant   Telemetry   Sinus 60-70s  Personally reviewed   Labs    CBC Recent Labs    05/09/19 0343 05/09/19 0529 05/10/19 0329 05/10/19 0411  WBC 15.8*  --  15.6*  --   HGB 8.1*   < > 7.9* 9.2*  HCT 27.6*   < > 26.9* 27.0*  MCV 95.8  --  97.8  --   PLT 339  --  350  --    < > = values in this  interval not displayed.   Basic Metabolic Panel Recent Labs    05/09/19 0343 05/09/19 0529 05/09/19 1620 05/09/19 1620 05/10/19 0329 05/10/19 0411  NA 135   < > 136   < > 135 136  K 4.6   < > 4.3   < > 4.4 4.5  CL 102   < > 103  --  102  --   CO2 26   < > 26  --  24  --   GLUCOSE 101*   < > 224*  --  158*  --   BUN 29*   < > 29*  --  29*  --   CREATININE 0.95   < > 1.06  --  1.00  --   CALCIUM 7.7*   < > 7.8*  --  7.7*  --   MG 2.6*  --   --   --  2.5*  --   PHOS 2.9   < > 2.8  --  2.8  --    < > = values in this interval not displayed.   Liver Function Tests Recent Labs    05/09/19 0343 05/09/19 0343 05/09/19 1620 05/10/19 0329  AST 81*  --   --   64*  ALT 123*  --   --  100*  ALKPHOS 577*  --   --  584*  BILITOT 1.2  --   --  1.3*  PROT 6.1*  --   --  6.2*  ALBUMIN 1.8*   < > 1.8* 1.8*   < > = values in this interval not displayed.   Recent Labs    05/07/19 1817  AMYLASE 58   Cardiac Enzymes No results for input(s): CKTOTAL, CKMB, CKMBINDEX, TROPONINI in the last 72 hours.  BNP: BNP (last 3 results) Recent Labs    03/25/2019 0113  BNP 655.7*    ProBNP (last 3 results) No results for input(s): PROBNP in the last 8760 hours.   D-Dimer No results for input(s): DDIMER in the last 72 hours. Hemoglobin A1C No results for input(s): HGBA1C in the last 72 hours. Fasting Lipid Panel No results for input(s): CHOL, HDL, LDLCALC, TRIG, CHOLHDL, LDLDIRECT in the last 72 hours. Thyroid Function Tests No results for input(s): TSH, T4TOTAL, T3FREE, THYROIDAB in the last 72 hours.  Invalid input(s): FREET3  Other results:   Imaging    DG CHEST PORT 1 VIEW  Result Date: 05/10/2019 CLINICAL DATA:  Pleural effusion and pneumonia. EXAM: PORTABLE CHEST 1 VIEW COMPARISON:  May 09, 2019 FINDINGS: Stable left central line. The feeding tube terminates below today's film. Stable right PICC line. Tracheostomy tube remains in place. No pneumothorax. Diffuse bilateral pulmonary infiltrates remain, similar in the interval. Stable cardiomediastinal silhouette. IMPRESSION: 1. Support apparatus as above. 2. Diffuse bilateral pulmonary infiltrates are centrally stable. Electronically Signed   By: Dorise Bullion III M.D   On: 05/10/2019 11:58     Medications:     Scheduled Medications: . amiodarone  200 mg Per Tube Daily  . aspirin EC  325 mg Oral Daily   Or  . aspirin  324 mg Per Tube Daily  . B-complex with vitamin C  1 tablet Per Tube Daily  . bisacodyl  10 mg Oral Daily   Or  . bisacodyl  10 mg Rectal Daily  . chlorhexidine gluconate (MEDLINE KIT)  15 mL Mouth Rinse BID  . Chlorhexidine Gluconate Cloth  6 each Topical Daily    . docusate  200 mg Per  Tube Daily  . feeding supplement (PRO-STAT SUGAR FREE 64)  60 mL Oral Q6H  . gabapentin  300 mg Per Tube Q8H  . insulin aspart  0-24 Units Subcutaneous Q4H  . insulin aspart  4 Units Subcutaneous Q4H  . insulin glargine  10 Units Subcutaneous BID  . mouth rinse  15 mL Mouth Rinse 10 times per day  . metoCLOPramide (REGLAN) injection  10 mg Intravenous Q8H  . midodrine  15 mg Per Tube TID WC  . pantoprazole sodium  40 mg Per Tube Daily  . pneumococcal 23 valent vaccine  0.5 mL Intramuscular Tomorrow-1000  . sodium chloride flush  10-40 mL Intracatheter Q12H    Infusions: .  prismasol BGK 4/2.5 400 mL/hr at 05/10/19 0108  .  prismasol BGK 4/2.5 200 mL/hr at 05/10/19 0821  . sodium chloride 10 mL/hr at 05/06/19 0600  . epinephrine Stopped (04/28/19 0138)  . feeding supplement (VITAL 1.5 CAL) 1,000 mL (05/10/19 0222)  . fentaNYL infusion INTRAVENOUS Stopped (04/30/19 1211)  . heparin 10,000 units/ 20 mL infusion syringe 1,000 Units/hr (05/10/19 0814)  . lactated ringers Stopped (04/29/2019 1640)  . lactated ringers Stopped (04/30/19 0930)  . milrinone 0.125 mcg/kg/min (05/10/19 1200)  . norepinephrine (LEVOPHED) Adult infusion 15 mcg/min (05/10/19 1200)  . piperacillin-tazobactam 100 mL/hr at 05/10/19 1200  . prismasol BGK 4/2.5 1,800 mL/hr at 05/10/19 0329  . vasopressin (PITRESSIN) infusion - *FOR SHOCK* 0.02 Units/min (05/10/19 1200)    PRN Medications: Place/Maintain arterial line **AND** sodium chloride, alteplase, fentaNYL, fentaNYL (SUBLIMAZE) injection, heparin, levalbuterol, ondansetron (ZOFRAN) IV, ondansetron (ZOFRAN) IV, oxyCODONE, sodium chloride, sodium chloride flush     Assessment/Plan    1. Acute systolic HF ->  Cardiogenic Shock  - Due to iCM. EF 20-25% - Impella 5.5 placed on 4/2.   - s/p CABG/MVRepair on 4/6  - Impella out 4/9.  - Echo 4/10 with EF 25-30%, normal RV, no MR.  - Echo 4/22 EF 30-35% mildly reduced RV MVR ok   - Now on  CVVH. CVP 5. Keep even today - Remains on norepinephrine 15 + vasopressin 0.02 + milrinone 0.125. On midodrine 15 tid. Co-ox marginal at 56%. We seem to be stuck.Will try to wean VP today. Continue NE and milrinone. Goal MAP > 65  2. CAD - LHC with severe 3 vessel CAD  - No s/s angina - s/p CABG x 3 MV Repair 4/6. Echo stable - on ASA/Crestor  3. Acute hypoxic respiratory failure - Due to pulmonary edema and PNA - Extubated 4/9 re-intubated on 4/12. Failed re-extubation on 4/14. Now s/p tracheostomy - Was on Unasyn for facaelis PNA, Now on zosyn/linezolid - Secretions decreasing. Back on TCT. CXR still with diffuse infiltrates  - ID recommends not treating yeast on sputum cx just yet (suspect colonization) - CVP 10-11. CVVHD even today. Likely need to start pulling soon to keep CVP < 10   4. Mitral Regurgitation - Mod-severe on ECHO - s/p MV repair, TEE 4/9 with minimal MR s/p repair.   - Echo 4/10 with no significant MR.  - echo 4/22 MVR stable  5. Uncontrolled DM - Hgb A1C 9.  - On insulin and sliding scale.  - No change  6. AKI - due to shock - Became anuric and now on CVVH to control volume overload.   - Remains anuric. Management as above  7. Acute blood loss Anemia  - HGb 7.9 today. No obvious bleeding. Get T&S in am   8. Polymorphic VT -  Recurrent VT during respiratory distress 4/14, - Now on po amio - no further VT on tele - Keep K> 4.0 Mg > 2.0  - no change  9. 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 No sensation R and L foot and occasionaly in his hands. Requires assistance with ADLs.   10. Severe Malnutrition - Prealbumin 8.9  - Nutrition on board - Continue w/ TFs  11. Elevated LFTs - Suspect shock liver - AST/ALT remain stably elevated.  12. Atrial fibrillation. paroxysmal - Now back in NSR. On po amio. No change  13. Debility, severe - will need SNF on d/c  14. Chronic pain issues - d/w CCM - will try to limit IV fentanyl push  and get enteral pain meds on board  CRITICAL CARE Performed by: Glori Bickers  Total critical care time: 35 minutes  Critical care time was exclusive of separately billable procedures and treating other patients.  Critical care was necessary to treat or prevent imminent or life-threatening deterioration.  Critical care was time spent personally by me (independent of midlevel providers or residents) on the following activities: development of treatment plan with patient and/or surrogate as well as nursing, discussions with consultants, evaluation of patient's response to treatment, examination of patient, obtaining history from patient or surrogate, ordering and performing treatments and interventions, ordering and review of laboratory studies, ordering and review of radiographic studies, pulse oximetry and re-evaluation of patient's condition.  Glori Bickers, MD  12:45 PM

## 2019-05-10 NOTE — Progress Notes (Signed)
8 Days Post-Op Procedure(s) (LRB): VIDEO BRONCHOSCOPY USING DISPOSABLE ANESTHESIA SCOPE (N/A) TRACHEOSTOMY (N/A) Subjective: No specific complaints but has been getting frequent pain meds per nurse.  Objective: Vital signs in last 24 hours: Temp:  [96.1 F (35.6 C)-97.9 F (36.6 C)] 97.6 F (36.4 C) (04/24 1400) Pulse Rate:  [63-146] 63 (04/24 1500) Cardiac Rhythm: Normal sinus rhythm (04/24 1400) Resp:  [10-23] 13 (04/24 1500) BP: (87-115)/(45-68) 103/54 (04/24 1500) SpO2:  [91 %-100 %] 91 % (04/24 1500) FiO2 (%):  [40 %] 40 % (04/24 1400) Weight:  [57.4 kg] 57.4 kg (04/24 0415)  Hemodynamic parameters for last 24 hours: CVP:  [7 mmHg-11 mmHg] 11 mmHg  Intake/Output from previous day: 04/23 0701 - 04/24 0700 In: 2578.4 [P.O.:100; I.V.:578.4; NG/GT:1700; IV Piggyback:200] Out: 2612  Intake/Output this shift: Total I/O In: 593.1 [I.V.:183.2; NG/GT:360; IV Piggyback:49.9] Out: 625 [Other:525; Stool:100]  General appearance: alert and cooperative Neurologic: intact Heart: regular rate and rhythm, S1, S2 normal, no murmur Lungs: clear to auscultation bilaterally Abdomen: soft, non-tender; bowel sounds normal Extremities: edema mild in ankles and feet Wound: incisions intact  Lab Results: Recent Labs    05/09/19 0343 05/09/19 0529 05/10/19 0329 05/10/19 0411  WBC 15.8*  --  15.6*  --   HGB 8.1*   < > 7.9* 9.2*  HCT 27.6*   < > 26.9* 27.0*  PLT 339  --  350  --    < > = values in this interval not displayed.   BMET:  Recent Labs    05/09/19 1620 05/09/19 1620 05/10/19 0329 05/10/19 0411  NA 136   < > 135 136  K 4.3   < > 4.4 4.5  CL 103  --  102  --   CO2 26  --  24  --   GLUCOSE 224*  --  158*  --   BUN 29*  --  29*  --   CREATININE 1.06  --  1.00  --   CALCIUM 7.8*  --  7.7*  --    < > = values in this interval not displayed.    PT/INR: No results for input(s): LABPROT, INR in the last 72 hours. ABG    Component Value Date/Time   PHART 7.400  05/10/2019 0411   HCO3 27.0 05/10/2019 0411   TCO2 28 05/10/2019 0411   ACIDBASEDEF 3.0 (H) 04/28/2019 0735   O2SAT 96.0 05/10/2019 0411   CBG (last 3)  Recent Labs    05/10/19 0336 05/10/19 0749 05/10/19 1139  GLUCAP 144* 103* 106*   CLINICAL DATA:  Pleural effusion and pneumonia.  EXAM: PORTABLE CHEST 1 VIEW  COMPARISON:  May 09, 2019  FINDINGS: Stable left central line. The feeding tube terminates below today's film. Stable right PICC line. Tracheostomy tube remains in place. No pneumothorax. Diffuse bilateral pulmonary infiltrates remain, similar in the interval. Stable cardiomediastinal silhouette.  IMPRESSION: 1. Support apparatus as above. 2. Diffuse bilateral pulmonary infiltrates are centrally stable.   Electronically Signed   By: Dorise Bullion III M.D   On: 05/10/2019 11:58 Assessment/Plan:  Remains on milrinone 0.125, NE 15, Vaso 0.01, midodrine for BP support. Co-ox is 56%. Trying to wean Vaso today.  Continuing trach collar trials. He looks comfortable on TC most of today so far.  ARF on CRRT: keeping even.  Zosyn and linezolid for PNA.  CXR still has diffuse bilateral infiltrates.  Tube feeds for nutrition.    LOS: 26 days    Gaye Pollack 05/10/2019

## 2019-05-10 NOTE — Procedures (Signed)
Admit: 03/29/2019 LOS: 26  45M dialysis dependent AKI; cardiogenic shock status post CABG + MV replacement 4/2  Current CRRT Prescription: Start Date: 04/26/19 Catheter: L Hauser Temp HD Cath BFR: 200 Pre Blood Pump: 400 4K DFR: 1800 4K Replacement Rate: 200 4K Goal UF: net even Anticoagulation: 108m/h hep fixed Clotting: infrequent ~1x/24h   S:  TC this AM,  K 4.4, P 2.8  No UOP, net even  Stable req of NE, milrinone, VP gtt  O: 04/23 0701 - 04/24 0700 In: 2578.4 [P.O.:100; I.V.:578.4; NG/GT:1700; IV Piggyback:200] Out: 2612   Filed Weights   05/09/19 0318 05/10/19 0045 05/10/19 0415  Weight: 57.7 kg 57.4 kg 57.4 kg    Recent Labs  Lab 05/09/19 0343 05/09/19 0529 05/09/19 1620 05/10/19 0329 05/10/19 0411  NA 135   < > 136 135 136  K 4.6   < > 4.3 4.4 4.5  CL 102  --  103 102  --   CO2 26  --  26 24  --   GLUCOSE 101*  --  224* 158*  --   BUN 29*  --  29* 29*  --   CREATININE 0.95  --  1.06 1.00  --   CALCIUM 7.7*  --  7.8* 7.7*  --   PHOS 2.9  --  2.8 2.8  --    < > = values in this interval not displayed.   Recent Labs  Lab 05/08/19 0500 05/08/19 0513 05/09/19 0343 05/09/19 0343 05/09/19 0529 05/10/19 0329 05/10/19 0411  WBC 18.4*  --  15.8*  --   --  15.6*  --   HGB 8.4*   < > 8.1*   < > 9.9* 7.9* 9.2*  HCT 28.0*   < > 27.6*   < > 29.0* 26.9* 27.0*  MCV 93.6  --  95.8  --   --  97.8  --   PLT 318  --  339  --   --  350  --    < > = values in this interval not displayed.    Scheduled Meds: . amiodarone  200 mg Per Tube Daily  . aspirin EC  325 mg Oral Daily   Or  . aspirin  324 mg Per Tube Daily  . B-complex with vitamin C  1 tablet Per Tube Daily  . bisacodyl  10 mg Oral Daily   Or  . bisacodyl  10 mg Rectal Daily  . chlorhexidine gluconate (MEDLINE KIT)  15 mL Mouth Rinse BID  . Chlorhexidine Gluconate Cloth  6 each Topical Daily  . docusate  200 mg Per Tube Daily  . feeding supplement (PRO-STAT SUGAR FREE 64)  60 mL Oral Q6H  .  gabapentin  300 mg Per Tube Q8H  . insulin aspart  0-24 Units Subcutaneous Q4H  . insulin aspart  4 Units Subcutaneous Q4H  . insulin glargine  10 Units Subcutaneous BID  . mouth rinse  15 mL Mouth Rinse 10 times per day  . metoCLOPramide (REGLAN) injection  10 mg Intravenous Q8H  . midodrine  15 mg Per Tube TID WC  . pantoprazole sodium  40 mg Per Tube Daily  . pneumococcal 23 valent vaccine  0.5 mL Intramuscular Tomorrow-1000  . sodium chloride flush  10-40 mL Intracatheter Q12H   Continuous Infusions: .  prismasol BGK 4/2.5 400 mL/hr at 05/10/19 0108  .  prismasol BGK 4/2.5 200 mL/hr at 05/10/19 0821  . sodium chloride 10 mL/hr at 05/06/19 0600  .  epinephrine Stopped (04/28/19 0138)  . feeding supplement (VITAL 1.5 CAL) 1,000 mL (05/10/19 0222)  . fentaNYL infusion INTRAVENOUS Stopped (04/30/19 1211)  . heparin 10,000 units/ 20 mL infusion syringe 1,000 Units/hr (05/10/19 0814)  . lactated ringers Stopped (05/06/2019 1640)  . lactated ringers Stopped (04/30/19 0930)  . milrinone 0.125 mcg/kg/min (05/10/19 0732)  . norepinephrine (LEVOPHED) Adult infusion 15 mcg/min (05/10/19 0700)  . piperacillin-tazobactam Stopped (05/10/19 0542)  . prismasol BGK 4/2.5 1,800 mL/hr at 05/10/19 0329  . vasopressin (PITRESSIN) infusion - *FOR SHOCK* 0.02 Units/min (05/10/19 0700)   PRN Meds:.Place/Maintain arterial line **AND** sodium chloride, alteplase, fentaNYL, fentaNYL (SUBLIMAZE) injection, heparin, levalbuterol, ondansetron (ZOFRAN) IV, ondansetron (ZOFRAN) IV, oxyCODONE, sodium chloride, sodium chloride flush  ABG    Component Value Date/Time   PHART 7.400 05/10/2019 0411   PCO2ART 43.4 05/10/2019 0411   PO2ART 84 05/10/2019 0411   HCO3 27.0 05/10/2019 0411   TCO2 28 05/10/2019 0411   ACIDBASEDEF 3.0 (H) 04/28/2019 0735   O2SAT 96.0 05/10/2019 0411    A/P  1. Dialysis dependent AKI, anuric 2. Hypophosphatemia, from CRRT, req intermittent repletion if < 2.5 3. Enterococcus faecalis  pneumonia; per primary / CCM / RCID 4. 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 Cardiogenic shock on inotrope 6. CAD status post CABG 7. Mitral regurgitation status post mitral valve repair 8. diabetes mellitus 2 9. Acute blood loss anemia; Hb stable 8-9s 10. Recurrent polymorphic ventricular tachycardia, on amiodarone  Cont all 4K baths.  Cont fixed dose hep in CRRT circuilt.   Pearson Grippe, MD [facility]

## 2019-05-10 NOTE — Progress Notes (Signed)
Patient placed on 40% ATC. No complications. Vital signs stable at this time. RT will continue to monitor.

## 2019-05-10 NOTE — Progress Notes (Signed)
OT Cancellation Note  Patient Details Name: Brendan [name] MRN: [id number] DOB: 1955-11-16   Cancelled Treatment:    Reason Eval/Treat Not Completed: Other (comment);Fatigue/lethargy limiting ability to participate(Pt irritable with family and not able to arouse for OT.)   Pt unable to keep eyes open for more than a few seconds. Per RN, pt was just given pain medicine recently. OT to continue to follow for re-eval.  Jefferey Pica, OTR/L Acute Rehabilitation Services Pager: [phone] Office: [phone]   Korben Carcione C 05/10/2019, 1:39 PM

## 2019-05-11 LAB — COMPREHENSIVE METABOLIC PANEL
ALT: 79 U/L — ABNORMAL HIGH (ref 0–44)
AST: 46 U/L — ABNORMAL HIGH (ref 15–41)
Albumin: 1.7 g/dL — ABNORMAL LOW (ref 3.5–5.0)
Alkaline Phosphatase: 531 U/L — ABNORMAL HIGH (ref 38–126)
Anion gap: 5 (ref 5–15)
BUN: 34 mg/dL — ABNORMAL HIGH (ref 8–23)
CO2: 27 mmol/L (ref 22–32)
Calcium: 7.8 mg/dL — ABNORMAL LOW (ref 8.9–10.3)
Chloride: 104 mmol/L (ref 98–111)
Creatinine, Ser: 1.03 mg/dL (ref 0.61–1.24)
GFR calc Af Amer: >60 mL/min (ref >60)
GFR calc non Af Amer: >60 mL/min (ref >60)
Glucose, Bld: 127 mg/dL — ABNORMAL HIGH (ref 70–99)
Potassium: 4.4 mmol/L (ref 3.5–5.1)
Sodium: 136 mmol/L (ref 135–145)
Total Bilirubin: 0.9 mg/dL (ref 0.3–1.2)
Total Protein: 6 g/dL — ABNORMAL LOW (ref 6.5–8.1)

## 2019-05-11 LAB — RENAL FUNCTION PANEL
Albumin: 1.6 g/dL — ABNORMAL LOW (ref 3.5–5.0)
Albumin: 1.6 g/dL — ABNORMAL LOW (ref 3.5–5.0)
Anion gap: 6 (ref 5–15)
Anion gap: 6 (ref 5–15)
BUN: 34 mg/dL — ABNORMAL HIGH (ref 8–23)
BUN: 33 mg/dL — ABNORMAL HIGH (ref 8–23)
CO2: 26 mmol/L (ref 22–32)
CO2: 26 mmol/L (ref 22–32)
Calcium: 7.7 mg/dL — ABNORMAL LOW (ref 8.9–10.3)
Calcium: 7.7 mg/dL — ABNORMAL LOW (ref 8.9–10.3)
Chloride: 103 mmol/L (ref 98–111)
Chloride: 103 mmol/L (ref 98–111)
Creatinine, Ser: 1.04 mg/dL (ref 0.61–1.24)
Creatinine, Ser: 1.04 mg/dL (ref 0.61–1.24)
GFR calc Af Amer: >60 mL/min (ref >60)
GFR calc Af Amer: >60 mL/min (ref >60)
GFR calc non Af Amer: >60 mL/min (ref >60)
GFR calc non Af Amer: >60 mL/min (ref >60)
Glucose, Bld: 128 mg/dL — ABNORMAL HIGH (ref 70–99)
Glucose, Bld: 142 mg/dL — ABNORMAL HIGH (ref 70–99)
Phosphorus: 2.9 mg/dL (ref 2.5–4.6)
Phosphorus: 3 mg/dL (ref 2.5–4.6)
Potassium: 4.4 mmol/L (ref 3.5–5.1)
Potassium: 4.4 mmol/L (ref 3.5–5.1)
Sodium: 135 mmol/L (ref 135–145)
Sodium: 135 mmol/L (ref 135–145)

## 2019-05-11 LAB — CULTURE, BLOOD (ROUTINE X 2)
Culture: NO GROWTH
Culture: NO GROWTH
Special Requests: ADEQUATE
Special Requests: ADEQUATE

## 2019-05-11 LAB — CBC
HCT: 26.2 % — ABNORMAL LOW (ref 39.0–52.0)
HCT: 33.3 % — ABNORMAL LOW (ref 39.0–52.0)
Hemoglobin: 7.7 g/dL — ABNORMAL LOW (ref 13.0–17.0)
Hemoglobin: 10.3 g/dL — ABNORMAL LOW (ref 13.0–17.0)
MCH: 28.7 pg (ref 26.0–34.0)
MCH: 29.8 pg (ref 26.0–34.0)
MCHC: 29.4 g/dL — ABNORMAL LOW (ref 30.0–36.0)
MCHC: 30.9 g/dL (ref 30.0–36.0)
MCV: 97.8 fL (ref 80.0–100.0)
MCV: 96.2 fL (ref 80.0–100.0)
Platelets: 324 10*3/uL (ref 150–400)
Platelets: 276 10*3/uL (ref 150–400)
RBC: 2.68 MIL/uL — ABNORMAL LOW (ref 4.22–5.81)
RBC: 3.46 MIL/uL — ABNORMAL LOW (ref 4.22–5.81)
RDW: 17.2 % — ABNORMAL HIGH (ref 11.5–15.5)
RDW: 17.3 % — ABNORMAL HIGH (ref 11.5–15.5)
WBC: 15.7 10*3/uL — ABNORMAL HIGH (ref 4.0–10.5)
WBC: 17 10*3/uL — ABNORMAL HIGH (ref 4.0–10.5)
nRBC: 0.1 % (ref 0.0–0.2)
nRBC: 0.2 % (ref 0.0–0.2)

## 2019-05-11 LAB — PREPARE RBC (CROSSMATCH)

## 2019-05-11 LAB — MAGNESIUM: Magnesium: 2.6 mg/dL — ABNORMAL HIGH (ref 1.7–2.4)

## 2019-05-11 LAB — GLUCOSE, CAPILLARY
Glucose-Capillary: 115 mg/dL — ABNORMAL HIGH (ref 70–99)
Glucose-Capillary: 142 mg/dL — ABNORMAL HIGH (ref 70–99)
Glucose-Capillary: 90 mg/dL (ref 70–99)
Glucose-Capillary: 122 mg/dL — ABNORMAL HIGH (ref 70–99)
Glucose-Capillary: 137 mg/dL — ABNORMAL HIGH (ref 70–99)

## 2019-05-11 LAB — COOXEMETRY PANEL
Carboxyhemoglobin: 2.1 % — ABNORMAL HIGH (ref 0.5–1.5)
Methemoglobin: 1.4 % (ref 0.0–1.5)
O2 Saturation: 58.8 %
Total hemoglobin: 8.1 g/dL — ABNORMAL LOW (ref 12.0–16.0)

## 2019-05-11 MED ORDER — GERHARDT'S BUTT CREAM
1.0000 "application " | TOPICAL_CREAM | CUTANEOUS | Status: DC | PRN
  Administered 2019-05-12 – 2019-06-13 (×8): 1 via TOPICAL
  Filled 2019-05-11 (×3): qty 1

## 2019-05-11 MED ORDER — OXYCODONE HCL 20 MG/ML PO CONC
5.0000 mg | ORAL | Status: DC | PRN
  Administered 2019-05-11 – 2019-05-12 (×3): 5 mg
  Filled 2019-05-11 (×3): qty 1

## 2019-05-11 MED ORDER — SODIUM CHLORIDE 0.9% IV SOLUTION
Freq: Once | INTRAVENOUS | Status: AC
  Administered 2019-05-11: 10 mL/h via INTRAVENOUS

## 2019-05-11 NOTE — Progress Notes (Signed)
Patient ID: Brendan [name], male   DOB: [birth year], 64 y.o.   MRN: [id number] TCTS Evening Rounds:  Remains on same pressors for BP support. Transfusion in progress. Rhythm is sinus 70 with some bigeminy. Neither atrial or ventricular wires will capture at 20 mv.  CVP 8.  CRRT in progress. Not pulling any fluid now.  Looks comfortable on TC with sats 92%

## 2019-05-11 NOTE — Progress Notes (Signed)
9 Days Post-Op Procedure(s) (LRB): VIDEO BRONCHOSCOPY USING DISPOSABLE ANESTHESIA SCOPE (N/A) TRACHEOSTOMY (N/A) Subjective: On trach collar. Says he is comfortable.  Objective: Vital signs in last 24 hours: Temp:  [97.4 F (36.3 C)-97.6 F (36.4 C)] 97.4 F (36.3 C) (04/25 1127) Pulse Rate:  [56-77] 74 (04/25 1300) Cardiac Rhythm: Normal sinus rhythm;Ventricular paced (04/25 1200) Resp:  [9-19] 12 (04/25 1300) BP: (78-114)/(45-85) 94/45 (04/25 1300) SpO2:  [91 %-100 %] 95 % (04/25 1300) FiO2 (%):  [40 %] 40 % (04/25 1200) Weight:  [57.9 kg] 57.9 kg (04/25 0500)  Hemodynamic parameters for last 24 hours: CVP:  [0 mmHg-10 mmHg] 9 mmHg  Intake/Output from previous day: 04/24 0701 - 04/25 0700 In: 2043.1 [I.V.:526.6; UQ/JF:3545; IV Piggyback:251.5] Out: 2137 [Stool:350] Intake/Output this shift: Total I/O In: 433.7 [I.V.:113.7; NG/GT:270; IV Piggyback:50] Out: 625 [Other:447; Stool:200]  General appearance: alert and cooperative Neurologic: intact Heart: regular rate and rhythm, S1, S2 normal, no murmur Lungs: clear to auscultation bilaterally Abdomen: soft, non-tender; bowel sounds normal Extremities: no edema Wound: incision ok   Lab Results: Recent Labs    05/10/19 0329 05/10/19 0329 05/10/19 0411 05/11/19 0328  WBC 15.6*  --   --  15.7*  HGB 7.9*   < > 9.2* 7.7*  HCT 26.9*   < > 27.0* 26.2*  PLT 350  --   --  324   < > = values in this interval not displayed.   BMET:  Recent Labs    05/10/19 1606 05/11/19 0328  NA 134* 136  135  K 3.8 4.4  4.4  CL 102 104  103  CO2 25 27  26   GLUCOSE 150* 127*  128*  BUN 31* 34*  34*  CREATININE 0.97 1.03  1.04  CALCIUM 7.8* 7.8*  7.7*    PT/INR: No results for input(s): LABPROT, INR in the last 72 hours. ABG    Component Value Date/Time   PHART 7.400 05/10/2019 0411   HCO3 27.0 05/10/2019 0411   TCO2 28 05/10/2019 0411   ACIDBASEDEF 3.0 (H) 04/28/2019 0735   O2SAT 58.8 05/11/2019 0330   CBG (last  3)  Recent Labs    05/11/19 0323 05/11/19 0737 05/11/19 1124  GLUCAP 115* 142* 90    Assessment/Plan: S/P Procedure(s) (LRB): VIDEO BRONCHOSCOPY USING DISPOSABLE ANESTHESIA SCOPE (N/A) TRACHEOSTOMY (N/A)  Remains on NE 16 and milrinone 0.125 with Co-ox 59 and CVP 9. He is anemic and lost some more blood with clotted CRRT filter this am. Will transfuse 2 units PRBC's.  VDRF: continue TC during the day.  Continue Zosyn for possible pneumonia.  Tube feeds for nutrition.   LOS: 27 days    Gaye Pollack 05/11/2019

## 2019-05-11 NOTE — Procedures (Signed)
Admit: 03/29/2019 LOS: 37  57M dialysis dependent AKI; cardiogenic shock status post CABG + MV replacement 4/2  Current CRRT Prescription: Start Date: 04/26/19 Catheter: L Jermyn Temp HD Cath BFR: 200 Pre Blood Pump: 400 4K DFR: 1800 4K Replacement Rate: 200 4K Goal UF: net even Anticoagulation: 1065m/h hep fixed Clotting: infrequent ~1x/24h   S:  TC this AM,  K 4.4 P 2.9  No UOP, net even  Stable req of NE, milrinone, VP gtt  O: 04/24 0701 - 04/25 0700 In: 2043.1 [I.V.:526.6; NG/GT:1265; IV Piggyback:251.5] Out: 2137 [Stool:350]  Filed Weights   05/10/19 0045 05/10/19 0415 05/11/19 0500  Weight: 57.4 kg 57.4 kg 57.9 kg    Recent Labs  Lab 05/10/19 0329 05/10/19 0329 05/10/19 0411 05/10/19 1606 05/11/19 0328  NA 135   < > 136 134* 136  135  K 4.4   < > 4.5 3.8 4.4  4.4  CL 102  --   --  102 104  103  CO2 24  --   --  25 27  26   GLUCOSE 158*  --   --  150* 127*  128*  BUN 29*  --   --  31* 34*  34*  CREATININE 1.00  --   --  0.97 1.03  1.04  CALCIUM 7.7*  --   --  7.8* 7.8*  7.7*  PHOS 2.8  --   --  2.9 2.9   < > = values in this interval not displayed.   Recent Labs  Lab 05/09/19 0343 05/09/19 0529 05/10/19 0329 05/10/19 0411 05/11/19 0328  WBC 15.8*  --  15.6*  --  15.7*  HGB 8.1*   < > 7.9* 9.2* 7.7*  HCT 27.6*   < > 26.9* 27.0* 26.2*  MCV 95.8  --  97.8  --  97.8  PLT 339  --  350  --  324   < > = values in this interval not displayed.    Scheduled Meds: . amiodarone  200 mg Per Tube Daily  . aspirin EC  325 mg Oral Daily   Or  . aspirin  324 mg Per Tube Daily  . B-complex with vitamin C  1 tablet Per Tube Daily  . chlorhexidine gluconate (MEDLINE KIT)  15 mL Mouth Rinse BID  . Chlorhexidine Gluconate Cloth  6 each Topical Daily  . docusate  200 mg Per Tube Daily  . feeding supplement (PRO-STAT SUGAR FREE 64)  60 mL Oral Q6H  . gabapentin  300 mg Per Tube Q8H  . insulin aspart  0-24 Units Subcutaneous Q4H  . insulin aspart  4  Units Subcutaneous Q4H  . insulin glargine  10 Units Subcutaneous BID  . mouth rinse  15 mL Mouth Rinse 10 times per day  . metoCLOPramide (REGLAN) injection  10 mg Intravenous Q8H  . midodrine  15 mg Per Tube TID WC  . pantoprazole sodium  40 mg Per Tube Daily  . pneumococcal 23 valent vaccine  0.5 mL Intramuscular Tomorrow-1000  . sodium chloride flush  10-40 mL Intracatheter Q12H   Continuous Infusions: .  prismasol BGK 4/2.5 400 mL/hr at 05/11/19 0303  .  prismasol BGK 4/2.5 200 mL/hr at 05/11/19 0819  . sodium chloride 10 mL/hr at 05/06/19 0600  . epinephrine Stopped (04/28/19 0138)  . feeding supplement (VITAL 1.5 CAL) 1,000 mL (05/10/19 2159)  . fentaNYL infusion INTRAVENOUS Stopped (04/30/19 1211)  . heparin 10,000 units/ 20 mL infusion syringe 1,000 Units/hr (05/11/19 0535)  .  lactated ringers Stopped (04/24/2019 1640)  . lactated ringers Stopped (04/30/19 0930)  . milrinone 0.125 mcg/kg/min (05/11/19 0700)  . norepinephrine (LEVOPHED) Adult infusion 15 mcg/min (05/11/19 0700)  . piperacillin-tazobactam Stopped (05/11/19 0550)  . prismasol BGK 4/2.5 1,800 mL/hr at 05/11/19 0820  . vasopressin (PITRESSIN) infusion - *FOR SHOCK* 0.01 Units/min (05/11/19 0829)   PRN Meds:.Place/Maintain arterial line **AND** sodium chloride, alteplase, fentaNYL, fentaNYL (SUBLIMAZE) injection, heparin, levalbuterol, ondansetron (ZOFRAN) IV, ondansetron (ZOFRAN) IV, oxyCODONE, sodium chloride, sodium chloride flush  ABG    Component Value Date/Time   PHART 7.400 05/10/2019 0411   PCO2ART 43.4 05/10/2019 0411   PO2ART 84 05/10/2019 0411   HCO3 27.0 05/10/2019 0411   TCO2 28 05/10/2019 0411   ACIDBASEDEF 3.0 (H) 04/28/2019 0735   O2SAT 58.8 05/11/2019 0330    A/P  1. Dialysis dependent AKI, anuric 2. Hypophosphatemia, from CRRT, req intermittent repletion if < 2.5 3. Enterococcus faecalis pneumonia; per primary / CCM / RCID 4. 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 Cardiogenic shock on  inotrope/pressors 6. CAD status post CABG 7. Mitral regurgitation status post mitral valve repair 8. diabetes mellitus 2 9. Acute blood loss anemia; Hb stable 8-9s 10. Recurrent polymorphic ventricular tachycardia, on amiodarone  Cont all 4K baths.  Cont fixed dose hep in CRRT circuilt.   Pearson Grippe, MD [facility]

## 2019-05-11 NOTE — Progress Notes (Signed)
Patient ID: Brendan [name], male   DOB: 1955-04-25, 64 y.o.   MRN: [id number]     Advanced Heart Failure Rounding Note  PCP-Cardiologist: No primary care provider on file.   Subjective:    Events - Presented to [facility] with acute HF  EF 20-25% - Transferred to [facility] - Cath 3/30 with severe 3 vessel disease. EF 25% - Developed PMVT on milrinone -> milrinone stopped -> developed worsening shock -> IABP placed on 4/1 - Clinical deterioration 4/1-> intubated - 4/2 underwent Impella 5.5 placement earlier and bilateral chest tubes with 3L out from each side.  - Extubated 4/4 - 05/15/2019 CABG and MVR - Extubated 4/7 - Back to OR for Impella 5.5 extraction 4/9, extubated.  TEE with EF 35%, RV ok, trivial MR s/p MV repair.  - CVVH started 4/10 with oliguria, rising creatinine and CVP.  - Deteriorating respiratory status, intubated again early am 4/12.  Enterococcus faecalis in sputum.  - Extubated 4/14.  Developed respiratory distress and started on Bipap.  Had VT arrest with CPR/epinephrine, intubated again 4/15 early am.  - Tracheostomy 4/16 - Atrial fibrillation early am 4/17   Did 6.5 hr of TC trials yesterday. Rested on vent overnight. Now back on TC.  Remains on CVVHD. Keeping even. CVP 9  Weight stable 126-127 (pre-op 144)  Remains on NE 15, vasopressin off and milrinone 0.125. Co-ox 59%  Denies SOB, CP, orthopnea or PND. Says his pain is controlled   Echo 4/22: EF 30-35% RV mildly reduced. MVR looks good.   Echo (4/10): EF 25-30%, normal RV, no significant MR.  Objective:   Weight Range: 57.9 kg Body mass index is 17.8 kg/m.   Vital Signs:   Temp:  [97.4 F (36.3 C)-97.6 F (36.4 C)] 97.4 F (36.3 C) (04/25 1127) Pulse Rate:  [56-77] 74 (04/25 1200) Resp:  [9-19] 11 (04/25 1200) BP: (78-114)/(46-85) 96/51 (04/25 1200) SpO2:  [91 %-100 %] 96 % (04/25 1200) FiO2 (%):  [40 %] 40 % (04/25 1200) Weight:  [57.9 kg] 57.9 kg (04/25 0500) Last BM Date: 05/11/19  Weight  change: Filed Weights   05/10/19 0045 05/10/19 0415 05/11/19 0500  Weight: 57.4 kg 57.4 kg 57.9 kg    Intake/Output:   Intake/Output Summary (Last 24 hours) at 05/11/2019 1245 Last data filed at 05/11/2019 1200 Gross per 24 hour  Intake 2024.28 ml  Output 2176 ml  Net -151.72 ml      Physical Exam   CVP 9 General:  Thin/cachetic chronically ill appearing on TC HEENT: normal + Cor-trak Neck: supple. JVP 9 + trach Carotids 2+ bilat; no bruits. No lymphadenopathy or thryomegaly appreciated. Cor: PMI nondisplaced. Regular rate & rhythm. No rubs, gallops or murmurs. Incisions ok left Brandon HD cath Lungs: coarse Abdomen: soft, nontender, nondistended. No hepatosplenomegaly. No bruits or masses. Good bowel sounds. Extremities: no cyanosis, clubbing, rash, edema Neuro: alert & orientedx3, cranial nerves grossly intact. moves all 4 extremities w/o difficulty. Affect pleasant   Telemetry   Sinus 70s  Personally reviewed   Labs    CBC Recent Labs    05/10/19 0329 05/10/19 0329 05/10/19 0411 05/11/19 0328  WBC 15.6*  --   --  15.7*  HGB 7.9*   < > 9.2* 7.7*  HCT 26.9*   < > 27.0* 26.2*  MCV 97.8  --   --  97.8  PLT 350  --   --  324   < > = values in this interval not displayed.  Basic Metabolic Panel Recent Labs    05/10/19 0329 05/10/19 0411 05/10/19 1606 05/11/19 0328  NA 135   < > 134* 136  135  K 4.4   < > 3.8 4.4  4.4  CL 102   < > 102 104  103  CO2 24   < > 25 27  26   GLUCOSE 158*   < > 150* 127*  128*  BUN 29*   < > 31* 34*  34*  CREATININE 1.00   < > 0.97 1.03  1.04  CALCIUM 7.7*   < > 7.8* 7.8*  7.7*  MG 2.5*  --   --  2.6*  PHOS 2.8   < > 2.9 2.9   < > = values in this interval not displayed.   Liver Function Tests Recent Labs    05/10/19 0329 05/10/19 0329 05/10/19 1606 05/11/19 0328  AST 64*  --   --  46*  ALT 100*  --   --  79*  ALKPHOS 584*  --   --  531*  BILITOT 1.3*  --   --  0.9  PROT 6.2*  --   --  6.0*  ALBUMIN 1.8*   < >  1.7* 1.7*  1.6*   < > = values in this interval not displayed.   No results for input(s): LIPASE, AMYLASE in the last 72 hours. Cardiac Enzymes No results for input(s): CKTOTAL, CKMB, CKMBINDEX, TROPONINI in the last 72 hours.  BNP: BNP (last 3 results) Recent Labs    03/21/2019 0113  BNP 655.7*    ProBNP (last 3 results) No results for input(s): PROBNP in the last 8760 hours.   D-Dimer No results for input(s): DDIMER in the last 72 hours. Hemoglobin A1C No results for input(s): HGBA1C in the last 72 hours. Fasting Lipid Panel No results for input(s): CHOL, HDL, LDLCALC, TRIG, CHOLHDL, LDLDIRECT in the last 72 hours. Thyroid Function Tests No results for input(s): TSH, T4TOTAL, T3FREE, THYROIDAB in the last 72 hours.  Invalid input(s): FREET3  Other results:   Imaging    No results found.   Medications:     Scheduled Medications: . amiodarone  200 mg Per Tube Daily  . aspirin EC  325 mg Oral Daily   Or  . aspirin  324 mg Per Tube Daily  . B-complex with vitamin C  1 tablet Per Tube Daily  . chlorhexidine gluconate (MEDLINE KIT)  15 mL Mouth Rinse BID  . Chlorhexidine Gluconate Cloth  6 each Topical Daily  . docusate  200 mg Per Tube Daily  . feeding supplement (PRO-STAT SUGAR FREE 64)  60 mL Oral Q6H  . gabapentin  300 mg Per Tube Q8H  . insulin aspart  0-24 Units Subcutaneous Q4H  . insulin aspart  4 Units Subcutaneous Q4H  . insulin glargine  10 Units Subcutaneous BID  . mouth rinse  15 mL Mouth Rinse 10 times per day  . metoCLOPramide (REGLAN) injection  10 mg Intravenous Q8H  . midodrine  15 mg Per Tube TID WC  . pantoprazole sodium  40 mg Per Tube Daily  . pneumococcal 23 valent vaccine  0.5 mL Intramuscular Tomorrow-1000  . sodium chloride flush  10-40 mL Intracatheter Q12H    Infusions: .  prismasol BGK 4/2.5 400 mL/hr at 05/11/19 0303  .  prismasol BGK 4/2.5 200 mL/hr at 05/11/19 0819  . sodium chloride 10 mL/hr at 05/06/19 0600  .  epinephrine Stopped (04/28/19 0138)  . feeding supplement (  VITAL 1.5 CAL) 1,000 mL (05/10/19 2159)  . fentaNYL infusion INTRAVENOUS Stopped (04/30/19 1211)  . heparin 10,000 units/ 20 mL infusion syringe 1,000 Units/hr (05/11/19 0535)  . lactated ringers Stopped (04/26/2019 1640)  . lactated ringers Stopped (04/30/19 0930)  . milrinone 0.125 mcg/kg/min (05/11/19 1200)  . norepinephrine (LEVOPHED) Adult infusion 15 mcg/min (05/11/19 1200)  . piperacillin-tazobactam 100 mL/hr at 05/11/19 1200  . prismasol BGK 4/2.5 1,800 mL/hr at 05/11/19 1206  . vasopressin (PITRESSIN) infusion - *FOR SHOCK* Stopped (05/11/19 1004)    PRN Medications: Place/Maintain arterial line **AND** sodium chloride, alteplase, fentaNYL, fentaNYL (SUBLIMAZE) injection, heparin, levalbuterol, ondansetron (ZOFRAN) IV, ondansetron (ZOFRAN) IV, oxyCODONE, sodium chloride, sodium chloride flush     Assessment/Plan    1. Acute systolic HF ->  Cardiogenic Shock  - Due to iCM. EF 20-25% - Impella 5.5 placed on 4/2.   - s/p CABG/MVRepair on 4/6  - Impella out 4/9.  - Echo 4/10 with EF 25-30%, normal RV, no MR.  - Echo 4/22 EF 30-35% mildly reduced RV MVR ok   - Now on CVVH. CVP 9. Weight well below baseline. Continue to keep even.  - Remains on norepinephrine 15 + milrinone 0.125. On midodrine 15 tid. Co-ox 59%. We seem to be stuck. Continue NE and milrinone. Goal MAP > 65 - May have to let CVP drift up a bit to help wean pressors if respiratory status can tolerate  2. CAD - LHC with severe 3 vessel CAD  - No s/s angina - s/p CABG x 3 MV Repair 4/6. Echo stable - on ASA/Crestor  3. Acute hypoxic respiratory failure - Due to pulmonary edema and PNA - Extubated 4/9 re-intubated on 4/12. Failed re-extubation on 4/14. Now s/p tracheostomy - Was on Unasyn for facaelis PNA, Now on zosyn/linezolid - Secretions decreasing. Back on TCT - tolerating well. CXR still with diffuse infiltrates. Repeat in am - ID recommends not  treating yeast on sputum cx just yet (suspect colonization) - CVP 9-10. Keeping even on CVVHD  4. Mitral Regurgitation - Mod-severe on ECHO - s/p MV repair, TEE 4/9 with minimal MR s/p repair.   - Echo 4/10 with no significant MR.  - echo 4/22 MVR stable  5. Uncontrolled DM - Hgb A1C 9.  - On insulin and sliding scale.  - No change  6. AKI - due to shock - Became anuric and now on CVVH to control volume overload.   - Remains anuric. Renal following  7. Acute blood loss Anemia  - HGb 7.7 today. No obvious bleeding.  - transfuse hgb < 7.5   8. Polymorphic VT - Recurrent VT during respiratory distress 4/14, - Now on po amio - no further VT on tele - Keep K> 4.0 Mg > 2.0  - no change  9. 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 No sensation R and L foot and occasionaly in his hands. Requires assistance with ADLs.   10. Severe Malnutrition - Prealbumin 8.9  - Nutrition on board - Continue w/ TFs  11. Elevated LFTs - Suspect shock liver - AST/ALT remain stably elevated.  12. Atrial fibrillation. paroxysmal -  Remains in NSR. On po amio. No change  13. Debility, severe - will need SNF on d/c  14. Chronic pain issues - pain well controlled on current regimen  CRITICAL CARE Performed by: Glori Bickers  Total critical care time: 35 minutes  Critical care time was exclusive of separately billable procedures and treating other patients.  Critical care  was necessary to treat or prevent imminent or life-threatening deterioration.  Critical care was time spent personally by me (independent of midlevel providers or residents) on the following activities: development of treatment plan with patient and/or surrogate as well as nursing, discussions with consultants, evaluation of patient's response to treatment, examination of patient, obtaining history from patient or surrogate, ordering and performing treatments and interventions, ordering and review of  laboratory studies, ordering and review of radiographic studies, pulse oximetry and re-evaluation of patient's condition.  Glori Bickers, MD  12:45 PM

## 2019-05-12 ENCOUNTER — Inpatient Hospital Stay (HOSPITAL_COMMUNITY): Payer: Medicaid Other

## 2019-05-12 DIAGNOSIS — L8990 Pressure ulcer of unspecified site, unspecified stage: Secondary | ICD-10-CM | POA: Insufficient documentation

## 2019-05-12 DIAGNOSIS — J9602 Acute respiratory failure with hypercapnia: Secondary | ICD-10-CM

## 2019-05-12 LAB — COMPREHENSIVE METABOLIC PANEL
ALT: 66 U/L — ABNORMAL HIGH (ref 0–44)
AST: 43 U/L — ABNORMAL HIGH (ref 15–41)
Albumin: 1.7 g/dL — ABNORMAL LOW (ref 3.5–5.0)
Alkaline Phosphatase: 504 U/L — ABNORMAL HIGH (ref 38–126)
Anion gap: 8 (ref 5–15)
BUN: 30 mg/dL — ABNORMAL HIGH (ref 8–23)
CO2: 25 mmol/L (ref 22–32)
Calcium: 7.7 mg/dL — ABNORMAL LOW (ref 8.9–10.3)
Chloride: 101 mmol/L (ref 98–111)
Creatinine, Ser: 0.93 mg/dL (ref 0.61–1.24)
GFR calc Af Amer: >60 mL/min (ref >60)
GFR calc non Af Amer: >60 mL/min (ref >60)
Glucose, Bld: 199 mg/dL — ABNORMAL HIGH (ref 70–99)
Potassium: 4.1 mmol/L (ref 3.5–5.1)
Sodium: 134 mmol/L — ABNORMAL LOW (ref 135–145)
Total Bilirubin: 1.1 mg/dL (ref 0.3–1.2)
Total Protein: 6.4 g/dL — ABNORMAL LOW (ref 6.5–8.1)

## 2019-05-12 LAB — TYPE AND SCREEN
ABO/RH(D): O NEG
Antibody Screen: NEGATIVE
Unit division: 0
Unit division: 0

## 2019-05-12 LAB — POCT I-STAT 7, (LYTES, BLD GAS, ICA,H+H)
Acid-Base Excess: 1 mmol/L (ref 0.0–2.0)
Bicarbonate: 28.5 mmol/L — ABNORMAL HIGH (ref 20.0–28.0)
Calcium, Ion: 1.25 mmol/L (ref 1.15–1.40)
HCT: 33 % — ABNORMAL LOW (ref 39.0–52.0)
Hemoglobin: 11.2 g/dL — ABNORMAL LOW (ref 13.0–17.0)
O2 Saturation: 90 %
Patient temperature: 98.6
Potassium: 4.5 mmol/L (ref 3.5–5.1)
Sodium: 135 mmol/L (ref 135–145)
TCO2: 30 mmol/L (ref 22–32)
pCO2 arterial: 57.7 mmHg — ABNORMAL HIGH (ref 32.0–48.0)
pH, Arterial: 7.303 — ABNORMAL LOW (ref 7.350–7.450)
pO2, Arterial: 67 mmHg — ABNORMAL LOW (ref 83.0–108.0)

## 2019-05-12 LAB — CBC
HCT: 34.1 % — ABNORMAL LOW (ref 39.0–52.0)
Hemoglobin: 10.3 g/dL — ABNORMAL LOW (ref 13.0–17.0)
MCH: 29.3 pg (ref 26.0–34.0)
MCHC: 30.2 g/dL (ref 30.0–36.0)
MCV: 96.9 fL (ref 80.0–100.0)
Platelets: 303 10*3/uL (ref 150–400)
RBC: 3.52 MIL/uL — ABNORMAL LOW (ref 4.22–5.81)
RDW: 18 % — ABNORMAL HIGH (ref 11.5–15.5)
WBC: 17.8 10*3/uL — ABNORMAL HIGH (ref 4.0–10.5)
nRBC: 0.1 % (ref 0.0–0.2)

## 2019-05-12 LAB — RENAL FUNCTION PANEL
Albumin: 1.7 g/dL — ABNORMAL LOW (ref 3.5–5.0)
Anion gap: 8 (ref 5–15)
BUN: 31 mg/dL — ABNORMAL HIGH (ref 8–23)
CO2: 25 mmol/L (ref 22–32)
Calcium: 7.8 mg/dL — ABNORMAL LOW (ref 8.9–10.3)
Chloride: 100 mmol/L (ref 98–111)
Creatinine, Ser: 0.95 mg/dL (ref 0.61–1.24)
GFR calc Af Amer: >60 mL/min (ref >60)
GFR calc non Af Amer: >60 mL/min (ref >60)
Glucose, Bld: 132 mg/dL — ABNORMAL HIGH (ref 70–99)
Phosphorus: 2.8 mg/dL (ref 2.5–4.6)
Potassium: 4.7 mmol/L (ref 3.5–5.1)
Sodium: 133 mmol/L — ABNORMAL LOW (ref 135–145)

## 2019-05-12 LAB — BPAM RBC
Blood Product Expiration Date: 202105012359
Blood Product Expiration Date: 202105012359
ISSUE DATE / TIME: 202104251726
ISSUE DATE / TIME: 202104251815
Unit Type and Rh: 9500
Unit Type and Rh: 9500

## 2019-05-12 LAB — PHOSPHORUS: Phosphorus: 2.9 mg/dL (ref 2.5–4.6)

## 2019-05-12 LAB — GLUCOSE, CAPILLARY
Glucose-Capillary: 154 mg/dL — ABNORMAL HIGH (ref 70–99)
Glucose-Capillary: 176 mg/dL — ABNORMAL HIGH (ref 70–99)
Glucose-Capillary: 179 mg/dL — ABNORMAL HIGH (ref 70–99)
Glucose-Capillary: 74 mg/dL (ref 70–99)
Glucose-Capillary: 90 mg/dL (ref 70–99)
Glucose-Capillary: 96 mg/dL (ref 70–99)

## 2019-05-12 LAB — SEDIMENTATION RATE: Sed Rate: 55 mm/hr — ABNORMAL HIGH (ref 0–16)

## 2019-05-12 LAB — COOXEMETRY PANEL
Carboxyhemoglobin: 2.5 % — ABNORMAL HIGH (ref 0.5–1.5)
Methemoglobin: 1.2 % (ref 0.0–1.5)
O2 Saturation: 62.8 %
Total hemoglobin: 11.5 g/dL — ABNORMAL LOW (ref 12.0–16.0)

## 2019-05-12 LAB — MAGNESIUM: Magnesium: 2.5 mg/dL — ABNORMAL HIGH (ref 1.7–2.4)

## 2019-05-12 MED ORDER — INSULIN ASPART 100 UNIT/ML ~~LOC~~ SOLN
2.0000 [IU] | SUBCUTANEOUS | Status: DC
  Administered 2019-05-12 – 2019-05-21 (×50): 2 [IU] via SUBCUTANEOUS

## 2019-05-12 MED ORDER — OXYCODONE HCL 20 MG/ML PO CONC
10.0000 mg | ORAL | Status: DC | PRN
  Administered 2019-05-12 – 2019-05-26 (×40): 10 mg
  Filled 2019-05-12 (×41): qty 1

## 2019-05-12 MED ORDER — AMIODARONE HCL 100 MG PO TABS
100.0000 mg | ORAL_TABLET | Freq: Every day | ORAL | Status: DC
  Administered 2019-05-12: 100 mg
  Filled 2019-05-12: qty 1

## 2019-05-12 NOTE — Progress Notes (Signed)
Admit: 04/01/2019 LOS: 54  33M dialysis dependent AKI; cardiogenic shock status post CABG + MV replacement 4/2  S: Seen and examined on CRRT and procedure supervised.  Has had 1.3 liters UF over 4/25 with CRRT thus far per charting.  Goal has been keep even but they have pulled a bit overnight due to oxygenation.  BF running at 180 on my exam.  Has been on levo at 14 and vaso and milrinone.  O: 04/25 0701 - 04/26 0700 In: 2153.1 [I.V.:548.1; Blood:315; NG/GT:1190; IV Piggyback:99.9] Out: 2133 [Stool:810]  Filed Weights   05/10/19 0045 05/10/19 0415 05/11/19 0500  Weight: 57.4 kg 57.4 kg 57.9 kg    Physical exam:  General adult male in bed critically ill  HEENT normocephalic atraumatic extraocular movements intact sclera anicteric Lungs coarse mechanical breath sounds   Heart S1S1 on pressors Abdomen soft nontender nondistended Extremities 2+ pedal and LE edema  Neuro - alert and interactive; follows commands Psych no anxiety or agitation Access: left chest catheter - nontunneled    Recent Labs  Lab 05/11/19 0328 05/11/19 0328 05/11/19 1502 05/12/19 0232 05/12/19 0330  NA 136  135   < > 135 134* 135  K 4.4  4.4   < > 4.4 4.1 4.5  CL 104  103  --  103 101  --   CO2 27  26  --  26 25  --   GLUCOSE 127*  128*  --  142* 199*  --   BUN 34*  34*  --  33* 30*  --   CREATININE 1.03  1.04  --  1.04 0.93  --   CALCIUM 7.8*  7.7*  --  7.7* 7.7*  --   PHOS 2.9  --  3.0 2.9  --    < > = values in this interval not displayed.   Recent Labs  Lab 05/11/19 0328 05/11/19 0328 05/11/19 2325 05/12/19 0232 05/12/19 0330  WBC 15.7*  --  17.0* 17.8*  --   HGB 7.7*   < > 10.3* 10.3* 11.2*  HCT 26.2*   < > 33.3* 34.1* 33.0*  MCV 97.8  --  96.2 96.9  --   PLT 324  --  276 303  --    < > = values in this interval not displayed.    Scheduled Meds: . amiodarone  200 mg Per Tube Daily  . aspirin EC  325 mg Oral Daily   Or  . aspirin  324 mg Per Tube Daily  . B-complex with  vitamin C  1 tablet Per Tube Daily  . chlorhexidine gluconate (MEDLINE KIT)  15 mL Mouth Rinse BID  . Chlorhexidine Gluconate Cloth  6 each Topical Daily  . docusate  200 mg Per Tube Daily  . feeding supplement (PRO-STAT SUGAR FREE 64)  60 mL Oral Q6H  . gabapentin  300 mg Per Tube Q8H  . insulin aspart  0-24 Units Subcutaneous Q4H  . insulin aspart  4 Units Subcutaneous Q4H  . insulin glargine  10 Units Subcutaneous BID  . mouth rinse  15 mL Mouth Rinse 10 times per day  . metoCLOPramide (REGLAN) injection  10 mg Intravenous Q8H  . midodrine  15 mg Per Tube TID WC  . pantoprazole sodium  40 mg Per Tube Daily  . pneumococcal 23 valent vaccine  0.5 mL Intramuscular Tomorrow-1000  . sodium chloride flush  10-40 mL Intracatheter Q12H   Continuous Infusions: .  prismasol BGK 4/2.5 400 mL/hr at 05/12/19  0212  .  prismasol BGK 4/2.5 200 mL/hr at 05/11/19 2219  . sodium chloride 10 mL/hr at 05/06/19 0600  . epinephrine Stopped (04/28/19 0138)  . feeding supplement (VITAL 1.5 CAL) 1,000 mL (05/11/19 2154)  . fentaNYL infusion INTRAVENOUS Stopped (04/30/19 1211)  . heparin 10,000 units/ 20 mL infusion syringe 1,000 Units/hr (05/12/19 0120)  . lactated ringers Stopped (05/01/2019 1640)  . lactated ringers Stopped (04/30/19 0930)  . milrinone 0.125 mcg/kg/min (05/12/19 0500)  . norepinephrine (LEVOPHED) Adult infusion 13 mcg/min (05/12/19 0500)  . piperacillin-tazobactam 3.375 g (05/12/19 0533)  . prismasol BGK 4/2.5 1,800 mL/hr at 05/12/19 0329  . vasopressin (PITRESSIN) infusion - *FOR SHOCK* 0.02 Units/min (05/12/19 0500)   PRN Meds:.Place/Maintain arterial line **AND** sodium chloride, alteplase, fentaNYL, fentaNYL (SUBLIMAZE) injection, Gerhardt's butt cream, heparin, levalbuterol, ondansetron (ZOFRAN) IV, ondansetron (ZOFRAN) IV, oxyCODONE, sodium chloride, sodium chloride flush  ABG    Component Value Date/Time   PHART 7.303 (L) 05/12/2019 0330   PCO2ART 57.7 (H) 05/12/2019 0330    PO2ART 67 (L) 05/12/2019 0330   HCO3 28.5 (H) 05/12/2019 0330   TCO2 30 05/12/2019 0330   ACIDBASEDEF 3.0 (H) 04/28/2019 0735   O2SAT 90.0 05/12/2019 0330    A/P  1. Dialysis dependent AKI, anuric.  CRRT started 4/10.  On all 4K baths.  UF goal net neg 25 to 50 an hour for now  has been on fixed dose heparin in CRRT circuit. Increase BF to 250 - spoke with nursing. 2. Hypophosphatemia, from CRRT, req intermittent repletion if < 2.5 3. Enterococcus faecalis pneumonia; per primary and CCM 4. 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 Cardiogenic shock on inotrope/pressors 6. CAD status post CABG 7. Mitral regurgitation status post mitral valve repair 8. diabetes mellitus 2 - per primary  9. Acute blood loss anemia - improved  10. Recurrent polymorphic ventricular tachycardia, on amiodarone  Claudia Desanctis 05/12/2019  6:05 AM

## 2019-05-12 NOTE — Progress Notes (Signed)
TCTS Evening Rounds  Tolerating CVVHD this pm on vasoactive meds  Intake/Output Summary (Last 24 hours) at 05/12/2019 1834 Last data filed at 05/12/2019 1800 Gross per 24 hour  Intake 3374.64 ml  Output 3426 ml  Net -51.36 ml    BP (!) 94/53   Pulse 72   Temp 98.7 F (37.1 C) (Oral)   Resp 14   Ht 5\' 11"  (1.803 m) Comment: measured x 3  Wt 57.8 kg   SpO2 93%   BMI 17.77 kg/m    Alert/oriented Exam unremarkable  A/P: continue present course. Pandora Mccrackin Z. Orvan Seen, [place]

## 2019-05-12 NOTE — Progress Notes (Signed)
Physical Therapy Treatment Patient Details Name: Brendan [name] MRN: [id number] DOB: 1955/05/27 Today's Date: 05/12/2019    History of Present Illness Pt presented with SOB and fatigue at [facility] found to have new HFrEF,  NSTEMI, and bilateral pleural effusions transferred to [facility] on 3/29 for further cardiac evaluation.  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  On the evening of 4/1, patient developed worsening respiratory distress and hypoxia, head R/L heart cath on 3/30 & R heart cath on 4/1 pt intubated on 4/1, extubated 4/4, had R & L chest tubes placed 4/2 due to bilateral pleural effusions, removed 4/5 and impella device placed 4/2.  CABG and MVR on 4/6. Extubated 4/7. Removal of impella 4/9. CVVH started 4/10. Respiratory distress and reintubated 4/12. Extubated 4/14. Reintubated 4/15. Trach 4/16.  PMHx of Tobacco abuse, HTN, poorly controlled DM, diabetic neuropathy    PT Comments    Patient progressing slowly but able to stand this session while on CRRT and on vent for PS/CPAP with trach.  Feel with improvement of medical issues should be able to tolerate CIR level rehab at d/c.  PT to follow.    Follow Up Recommendations  CIR;Supervision/Assistance - 24 hour     Equipment Recommendations  Other (comment)(TBA)    Recommendations for Other Services       Precautions / Restrictions Precautions Precautions: Fall;Sternal Precaution Comments: CRRT, flexiseal, trach on PS/CPAP 50% FiO2 PEEP 5    Mobility  Bed Mobility Overal bed mobility: Needs Assistance             General bed mobility comments: used bed egress mode with assist for scooting out to edge of seat/bed, then return to semi-reclined  Transfers Overall transfer level: Needs assistance Equipment used: Rolling walker (2 wheeled);2 person hand held assist Transfers: Sit to/from Stand Sit to Stand: Max assist;+2 physical assistance;From elevated surface         General transfer comment: assist to scoot to edge of  bed in egress mode then +2 support with pad under his hips to stand to RW, but lasted only few seconds then returned to sit due to knees buckling; second stand without RW and A with +2 for lifting and stabilizing knees/hips to allow RN to do linen change and perineal hygiene.  Ambulation/Gait                 Stairs             Wheelchair Mobility    Modified Rankin (Stroke Patients Only)       Balance Overall balance assessment: Needs assistance   Sitting balance-Leahy Scale: Poor Sitting balance - Comments: edge of seat needing min support for balance   Standing balance support: Bilateral upper extremity supported Standing balance-Leahy Scale: Poor Standing balance comment: LE's not supporting balance long without support +2 A                            Cognition Arousal/Alertness: Awake/alert Behavior During Therapy: Flat affect Overall Cognitive Status: Difficult to assess                                 General Comments: follows 1 step commands with increased time      Exercises General Exercises - Lower Extremity Long Arc Quad: AROM;Seated;Other (comment)(8 reps)    General Comments General comments (skin integrity, edema, etc.): VSS with activity  Pertinent Vitals/Pain Pain Assessment: Faces Faces Pain Scale: No hurt    Home Living                      Prior Function            PT Goals (current goals can now be found in the care plan section) Progress towards PT goals: Progressing toward goals    Frequency    Min 3X/week      PT Plan Current plan remains appropriate    Co-evaluation PT/OT/SLP Co-Evaluation/Treatment: Yes Reason for Co-Treatment: Complexity of the patient's impairments (multi-system involvement);For patient/therapist safety;To address functional/ADL transfers PT goals addressed during session: Mobility/safety with mobility;Strengthening/ROM        AM-PAC PT "6 Clicks"  Mobility   Outcome Measure  Help needed turning from your back to your side while in a flat bed without using bedrails?: A Lot Help needed moving from lying on your back to sitting on the side of a flat bed without using bedrails?: A Lot Help needed moving to and from a bed to a chair (including a wheelchair)?: Total Help needed standing up from a chair using your arms (e.g., wheelchair or bedside chair)?: Total Help needed to walk in hospital room?: Total Help needed climbing 3-5 steps with a railing? : Total 6 Click Score: 8    End of Session Equipment Utilized During Treatment: Other (comment)(vent) Activity Tolerance: Patient limited by fatigue Patient left: in bed;with call bell/phone within reach;with nursing/sitter in room   PT Visit Diagnosis: Other abnormalities of gait and mobility (R26.89);Muscle weakness (generalized) (M62.81)     Time: 0300-9233 PT Time Calculation (min) (ACUTE ONLY): 23 min  Charges:  $Therapeutic Activity: 8-22 mins                     Brendan [name], [place] [facility] [phone] 05/12/2019    Brendan [name] 05/12/2019, 12:58 PM

## 2019-05-12 NOTE — Progress Notes (Signed)
Inpatient Diabetes Program Recommendations  AACE/ADA: New Consensus Statement on Inpatient Glycemic Control (2015)  Target Ranges:  Prepandial:   less than 140 mg/dL      Peak postprandial:   less than 180 mg/dL (1-2 hours)      Critically ill patients:  140 - 180 mg/dL   Lab Results  Component Value Date   GLUCAP 74 05/12/2019   HGBA1C 9.0 (H) 04/12/2019    Review of Glycemic Control Results for BETTY, BROOKS (MRN [id number]) as of 05/12/2019 11:43  Ref. Range 05/11/2019 20:01 05/12/2019 00:22 05/12/2019 04:03 05/12/2019 07:47 05/12/2019 11:27  Glucose-Capillary Latest Ref Range: 70 - 99 mg/dL 137 (H) 179 (H) 154 (H) 90 74   Diabetes history: DM 2 Outpatient Diabetes medications: Metformin 500 mg bid, Humalog 8 units bid with meals, Basaglar 28 units q HS Current orders for Inpatient glycemic control:  Lantus 10 units bid TCTS q 4 hours Novolog 4 units q 4 hours Vital 45 cc/ hr  Inpatient Diabetes Program Recommendations:    Consider reducing Novolog tube feed coverage to 2 units q 4 hours.   Thanks,  Adah Perl, RN, BC-ADM Inpatient Diabetes Coordinator Pager [phone] (8a-5p)

## 2019-05-12 NOTE — Progress Notes (Signed)
NAME:  Brendan [name], MRN:  [id number], DOB:  May 27, 1955, LOS: 92 ADMISSION DATE:  03/21/2019, CONSULTATION DATE:  04/17/19 REFERRING MD:  Dr. Haroldine Laws, CHIEF COMPLAINT:  Respiratory distress   Brief History   58 yoM originally presented with SOB and fatigue at [facility] found to have new HFrEF, NSTEMI, and bilateral pleural effusions transferred to [facility] on 3/29 for further cardiac evaluation. 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 Started on lasix and milrinone gtts however developed NSVT. Was taken 4/1 for placement of IABP and swan for optimization prior to CABG. Was on precedex for agitation/ confusion, some concern for DTs. On the evening of 4/1, patient developed worsening respiratory distress and hypoxia, PCCM consulted for intubation and vent management.   He was successfully extubated on 4/4 and PCCM signed off. Impella was removed 4/11 and patient noted to have increased WOB throughout the day he was placed on Bipap, but mental status continued to worsen and PCCM consulted for possible re-intubation  History of present illness   Brendan [name] is a 63 yo male withpoorly controlled diabetes complicated by neuropathy and hypertension who presented to an outside hospital on 3/26 with a chief complaint of general malaise and shortness of breath for the past 4 days. He is now transferred to [facility] for evaluation of troponin elevation, evaluation of newly depressed EF,and consideration for coronary angiography.  History obtained from patient report as well as discharge paperwork from outside hospital. Briefly, patient reportedly had nausea, vomiting, diarrhea approximately 1 week and is confined to his bed. He finally presented to the outside hospital ED he was found to have a blood sugar of 516, BUN of 53, creatinine 1.2, and anion gap of 17. Patient's BNP was elevated to 8900. Troponin was elevated to 13.1 with changes noted by the team of their lateral leads as well as V1 and V2.  Patient was started on aspirin and a heparin drip. Chest x-ray on admission was concerning for bilateral pulmonary opacities differential including infection and pulmonary edema. So he was started on empiric ceftriaxone and doxycycline, although his procalcitonin was negative. He had a chest wall echo that was performed that showed an EF of 25% was diuresed during his admission with improvement in his shortness of breath as well as radiographically. On discussion with patient he has been chest pain-free since his admission totheoutside hospital. In addition, he feels that his shortness of breath is much improved.the  Patient had left heart cath on 04/04/2019 showing severe three-vessel disease. 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 He then developed worsening cardiogenic shock and was intubated on 4/1 andplaced on a IABP on 4/02. He also had bilateral chest tubes placed with3 L removed from each side. He was then extubated on 4/4. On 4/6 patient had CABG/MVRperformed and was extubated on 4/7 with improved mentation with weaning of sedation and pressors. Patient had Impella removed on 4/9 and subsequent TEE showing an EF of 35%with trivial MR s/p MV repair. Remained on BiPAP at this time.On 4/10 nephrology was consulted due to anuric sinceCABG and started on CRRT.Developing worsening shortness of breath and increased work of breathing on BiPAP with hypoxia on ABG and subsequently reintubated on 4/12.  Patient was extubated on 4/14 and developed worsening hypoxia on BiPAP. Overnight he developed pulseless VT requiring 1 minute of CPR and epinephrine to achieve ROSC. Neurovascularly intact afterward. Subsequently had increased work of breathing and worsening respiratory distress requiring intubation.  Patient had tracheostomy performed on  05/05/2019. Trying to wean off ventilator.   Past Medical History  Tobacco abuse, HTN, poorly  controlled diabetes, diabetic neuropathy  Significant Hospital Events   3/30 Lt heart cath/TEE performed 4/1 Intubated, RHC/IABP/PA cath 4/2 Impella placement 4/3 febrile over evening and night. Blood cultures obtained. Vanc/cefepime started. Hematuria--heparin held. 4/4 Extubated,abx transitioned to rocephin. Heparin restarted 4/6 Re-intubated for CABG/MVR 4/7 Extubated from CABG to BiPAP 4/9 Impella removed  4/9 TEE 4/10 CVC inserted &CRRT initiated  4/12 PCCMre-consulted re-intubation 4/14 Extubated to BiPAP 4/15 Re-intubated due to respiratory distress  Consults:  TCTS PCCM HF  Procedures:  4/15ET tube - 4/15 4/9 Cortrak> 4/6 Rt IJ> 4/10 HD cath- 4/13 4/12 foley cath> 3/30 PICC> 4/13 HD cath> 4/16 Tracheostomy>  Significant Diagnostic Tests:  3/30 R/ LHC >>  Ost LM to Mid LM lesion is 35% stenosed.  Prox LAD lesion is 90% stenosed.  Prox LAD to Mid LAD lesion is 50% stenosed.  Mid Cx lesion is 100% stenosed.  Prox RCA to Mid RCA lesion is 90% stenosed.  RPDA lesion is 95% stenosed.  LV end diastolic pressure is severely elevated.  Hemodynamic findings consistent with moderate pulmonary hypertension. 1. Severe 3 vessel obstructive CAD 2. High LV filling pressures 3. Reduced cardiac output with index 2.3 4. Moderate pulmonary HTN.  3/30TTE >> 1. Left ventricular ejection fraction, by estimation, is 30 to 35%. The left ventricle has moderately decreased function. The left ventricle demonstrates regional wall motion abnormalities.There is mild left ventricular hypertrophy. Left ventricular diastolic function could not be evaluated. There is severe hypokinesis of the left ventricular, entire inferior wall, inferoseptal wall, apical segment and lateral wall.  2. Right ventricular systolic function is hyperdynamic. The right ventricular size is normal. There is moderately elevated pulmonary artery systolic pressure.  3. Decreased posterior leaflet  motion due to ischemic tethering of the mitral valve leaflets.  4. The mitral valve is abnormal. Moderate to severe mitral valve regurgitation.  5. The aortic valve is tricuspid. Aortic valve regurgitation is not visualized. Mild aortic valve sclerosis is present, with no evidence of aortic valve stenosis.  6. The inferior vena cava is normal in size with greater than 50% respiratory variability, suggesting right atrial pressure of 3 mmHg.   4/1 CT chest w/o >>Large bilateral pleural effusions with associated atelectasis.Moderate to severe bilateral ground-glass opacities, likely reflecting pneumonia and/or edema.  4/1 RHC >> RA = 18 RV = 60/20  PA = 63/29 (42) PCW = 33 Fick cardiac output/index = 3.7/1.9 PVR = 2.5 WU FA sat = 98% PA sat = 47%, 49% PaPi = 2.2   Micro Data:  3/30 MRSA PCR >> neg 4/1 MRSA PCR >> neg 4/2 resp culture>>rare gram pos cocci, few candida albicans 4/3 Urine culture>>negative 4/4 blood cultures>>no growth  4/9 Surgical wound>> rare enterococcus faecalis 4/9 BCx2>>no gorwth 4/9 Urine Culture>> greater than 100k yeast 4/12 Resp >. Negative 4/16 BAL >> negative (AFB, fungal negative) 4/20 Resp >> rare yeast >>  4/20 blood >>  4/21 Resp >> rare yeast >>    Antimicrobials:  PTA ceftriaxone/ doxycycline >> 3/29 Ceftriaxone 4/4-4/5 Cefepime 4/4>>4/11 vanc 4/4>>4/6 Rocephin 4/4>>4/5 Cefuroxime 4/6-4/7 Ampicillin 4/11 Vancomycin 4/12>>4/13 Cefepime 4/12>>4/13 Augmentin / unasyn 4/15> 4/20 Zosyn 4/20 >>  linezolid 4/20 >>    Interim history/subjective:  Has been doing some daily ATC, but fiO2 increased to 0.60 this am after paO2 59, currently PSV with 0.60 Remains on norepi 17, milrinone 0.125, vasopressin 0.02 Thick secretions reported. Some question aspiration 4/21  on suctioning > TF restarted, no residuals reported.   Objective   Blood pressure (!) 105/55, pulse 75, temperature 97.8 F (36.6 C), resp. rate 18, height _0  (1.803  m), weight 57.8 kg, SpO2 (!) 88 %. CVP:  [5 mmHg-9 mmHg] 9 mmHg  Vent Mode: CPAP;PSV FiO2 (%):  [40 %-70 %] 60 % Set Rate:  [16 bmp] 16 bmp Vt Set:  [450 mL] 450 mL PEEP:  [5 cmH20] 5 cmH20 Pressure Support:  [5 cmH20-8 cmH20] 5 cmH20 Plateau Pressure:  [19 cmH20] 19 cmH20   Intake/Output Summary (Last 24 hours) at 05/12/2019 1303 Last data filed at 05/12/2019 1300 Gross per 24 hour  Intake 3144.88 ml  Output 3232 ml  Net -87.12 ml   Filed Weights   05/10/19 0415 05/11/19 0500 05/12/19 0415  Weight: 57.4 kg 57.9 kg 57.8 kg    Examination:  General appearance:  Thin frail appearing man sitting up in bed on the vent Eyes:  Anicteric, tracking around the room HENT:  [place]/AT, eyes anicteric Neck: Trach in place, minimal clear secretions around trach Lungs:  Wheezing on the right, rales bilaterally.  Breathing comfortably on 5/5, 50% on the vent CV:  Regular rate and rhythm, no murmurs Abdomen:  Thin, soft, nontender, nondistended Extremities:  No peripheral edema, no clubbing or cyanosis Neuro:  Alert and oriented, answering questions appropriately  CXR 4/26 personally reviewed-batwing pulmonary infiltrates  Resolved Hospital Problem list     Assessment & Plan:  Acute hypoxic respiratory failure, multifactorial.  Appears to be in worse pulmonary edema today, unable to tolerate trach collar today. -Continue CPAP plus pressure support -Goal is to get back to trach collar daily.  Previously tolerating 6 hours/day.   Lurline Idol care per protocol -Repeat trach aspirate today -Xopenex as needed -Agree that Candida is likely colonizer rather than true pathogen -VAP prevention protocol  HFrEF (20-25%), cardiogenic shock, NSTEMI with three-vessel disease, NSVT Polymorphic VT -Management per primary and heart failure service  -Volume removal with CRRT -Working on titrating off amiodarone.  Elevated ESR; leukocytosis- unclear if this could be a marker of amiodarone induced lung  toxicity versus acute infection.  Leukocytosis would be less consistent with amiodarone induced lung toxicity -Agree with titrating off amiodarone -Repeat sputum culture  Severe Malnutrition -Continue enteral feeding  AKI, possibly ATN in the setting of cardiogenic shock, anuric  -Continue CRRT per nephrology.  I agree with previous questioning of  whether he will be able to ever tolerate iHD - significant long range issue.  Diabetes mellitus -insulin per primary  Elevated transaminase enzymes with cholestasis pattern, concern for congestive hepatopathy -Most consistent with hepatic congestion; con't volume removal.  Best practice:  Diet: Tube feeds Pain/Anxiety/Delirium protocol (if indicated):Fentanyl VAP protocol (if indicated):HOB 30 degrees,  DVT prophylaxis: heparin  GI prophylaxis: PPI Glucose control:SSI Mobility: BR Code Status: Full  Family Communication:per primary team  Disposition:ICU  Labs   CBC: Recent Labs  Lab 05/09/19 0343 05/09/19 0529 05/10/19 0329 05/10/19 0329 05/10/19 0411 05/11/19 0328 05/11/19 2325 05/12/19 0232 05/12/19 0330  WBC 15.8*  --  15.6*  --   --  15.7* 17.0* 17.8*  --   HGB 8.1*   < > 7.9*   < > 9.2* 7.7* 10.3* 10.3* 11.2*  HCT 27.6*   < > 26.9*   < > 27.0* 26.2* 33.3* 34.1* 33.0*  MCV 95.8  --  97.8  --   --  97.8 96.2 96.9  --   PLT 339  --  350  --   --  324 276 303  --    < > = values in this interval not displayed.    Basic Metabolic Panel: Recent Labs  Lab 05/08/19 0500 05/08/19 0513 05/09/19 0343 05/09/19 0529 05/10/19 0329 05/10/19 0411 05/10/19 1606 05/11/19 0328 05/11/19 1502 05/12/19 0232 05/12/19 0330  NA 136   < > 135   < > 135   < > 134* 136  135 135 134* 135  K 4.9   < > 4.6   < > 4.4   < > 3.8 4.4  4.4 4.4 4.1 4.5  CL 103   < > 102   < > 102  --  102 104  103 103 101  --   CO2 25   < > 26   < > 24  --  _0 --   GLUCOSE 151*   < > 101*   < > 158*  --  150* 127*  128* 142*  199*  --   BUN 29*   < > 29*   < > 29*  --  31* 34*  34* 33* 30*  --   CREATININE 1.23   < > 0.95   < > 1.00  --  0.97 1.03  1.04 1.04 0.93  --   CALCIUM 7.6*   < > 7.7*   < > 7.7*  --  7.8* 7.8*  7.7* 7.7* 7.7*  --   MG 2.6*  --  2.6*  --  2.5*  --   --  2.6*  --  2.5*  --   PHOS 3.0   < > 2.9   < > 2.8  --  2.9 2.9 3.0 2.9  --    < > = values in this interval not displayed.   GFR: Estimated Creatinine Clearance: 68.2 mL/min (by C-G formula based on SCr of 0.93 mg/dL). Recent Labs  Lab 05/07/19 0502 05/07/19 0954 05/08/19 0500 05/08/19 0500 05/09/19 0343 05/09/19 0343 05/10/19 0329 05/11/19 0328 05/11/19 2325 05/12/19 0232  PROCALCITON  --  1.34 1.28  --  1.69  --   --   --   --   --   WBC   < >  --  18.4*   < > 15.8*   < > 15.6* 15.7* 17.0* 17.8*   < > = values in this interval not displayed.    Liver Function Tests: Recent Labs  Lab 05/08/19 0500 05/08/19 1730 05/09/19 0343 05/09/19 1620 05/10/19 0329 05/10/19 1606 05/11/19 0328 05/11/19 1502 05/12/19 0232  AST 61*  --  81*  --  64*  --  46*  --  43*  ALT 125*  --  123*  --  100*  --  79*  --  66*  ALKPHOS 547*  --  577*  --  584*  --  531*  --  504*  BILITOT 1.5*  --  1.2  --  1.3*  --  0.9  --  1.1  PROT 6.1*  --  6.1*  --  6.2*  --  6.0*  --  6.4*  ALBUMIN 1.8*   < > 1.8*   < > 1.8* 1.7* 1.7*  1.6* 1.6* 1.7*   < > = values in this interval not displayed.   Recent Labs  Lab 05/07/19 1817  AMYLASE 58   No results for input(s): AMMONIA in the last 168 hours.  ABG    Component Value Date/Time  PHART 7.303 (L) 05/12/2019 0330   PCO2ART 57.7 (H) 05/12/2019 0330   PO2ART 67 (L) 05/12/2019 0330   HCO3 28.5 (H) 05/12/2019 0330   TCO2 30 05/12/2019 0330   ACIDBASEDEF 3.0 (H) 04/28/2019 0735   O2SAT 90.0 05/12/2019 0330     Coagulation Profile: No results for input(s): INR, PROTIME in the last 168 hours.  Cardiac Enzymes: No results for input(s): CKTOTAL, CKMB, CKMBINDEX, TROPONINI in the last 168  hours.  HbA1C: HbA1c, POC (controlled diabetic range)  Date/Time Value Ref Range Status  01/14/2019 02:02 PM 12.5 (A) 0.0 - 7.0 % Final  08/21/2017 02:03 PM 8.5 (A) 0.0 - 7.0 % Final   Hgb A1c MFr Bld  Date/Time Value Ref Range Status  04/12/2019 09:49 PM 9.0 (H) 4.8 - 5.6 % Final    Comment:    (NOTE) Pre diabetes:          5.7%-6.4% Diabetes:              >6.4% Glycemic control for   <7.0% adults with diabetes     CBG: Recent Labs  Lab 05/11/19 2001 05/12/19 0022 05/12/19 0403 05/12/19 0747 05/12/19 [place] Teela Narducci, DO 05/12/19 1:03 PM [facility]

## 2019-05-12 NOTE — Progress Notes (Signed)
Patient ID: Brendan [name], male   DOB: [birth year], 64 y.o.   MRN: [id number]     Advanced Heart Failure Rounding Note  PCP-Cardiologist: No primary care provider on file.   Subjective:    Events - Presented to [facility] with acute HF  EF 20-25% - [facility] - Cath 3/30 with severe 3 vessel disease. EF 25% - Developed PMVT on milrinone -> milrinone stopped -> developed worsening shock -> IABP placed on 4/1 - Clinical deterioration 4/1-> intubated - 4/2 underwent Impella 5.5 placement earlier and bilateral chest tubes with 3L out from each side.  - Extubated 4/4 - 05/14/2019 CABG and MVR - Extubated 4/7 - Back to OR for Impella 5.5 extraction 4/9, extubated.  TEE with EF 35%, RV ok, trivial MR s/p MV repair.  - CVVH started 4/10 with oliguria, rising creatinine and CVP.  - Deteriorating respiratory status, intubated again early am 4/12.  Enterococcus faecalis in sputum.  - Extubated 4/14.  Developed respiratory distress and started on Bipap.  Had VT arrest with CPR/epinephrine, intubated again 4/15 early am.  - Tracheostomy 4/16 - Atrial fibrillation early am 4/17  Have been keeping even on CVVHD. Respiratory status worse this am. CXR worse. CVP 9.   Remains on NE 12 , VP 0.02. Milrinone 0.125  Echo 4/22: EF 30-35% RV mildly reduced. MVR looks good.   Echo (4/10): EF 25-30%, normal RV, no significant MR.  Objective:   Weight Range: 57.8 kg Body mass index is 17.77 kg/m.   Vital Signs:   Temp:  [97.3 F (36.3 C)-97.8 F (36.6 C)] 97.5 F (36.4 C) (04/26 0800) Pulse Rate:  [34-105] 70 (04/26 0800) Resp:  [9-19] 14 (04/26 0800) BP: (91-127)/(45-75) 91/50 (04/26 0800) SpO2:  [86 %-100 %] 100 % (04/26 0800) FiO2 (%):  [40 %-70 %] 60 % (04/26 0800) Weight:  [57.8 kg] 57.8 kg (04/26 0415) Last BM Date: 05/12/19  Weight change: Filed Weights   05/10/19 0415 05/11/19 0500 05/12/19 0415  Weight: 57.4 kg 57.9 kg 57.8 kg    Intake/Output:   Intake/Output Summary  (Last 24 hours) at 05/12/2019 0829 Last data filed at 05/12/2019 0800 Gross per 24 hour  Intake 2579.79 ml  Output 2360 ml  Net 219.79 ml      Physical Exam   CVP 9 General:  Thin/cachetic chronically ill appearing on TC HEENT: normal + Cor-trak Neck: supple. JVP to jaw  + trach Carotids 2+ bilat; no bruits. No lymphadenopathy or thryomegaly appreciated. Cor: PMI nondisplaced. Regular rate & rhythm. No rubs, gallops or murmurs. Lungs: + fine crackles  Abdomen: soft, nontender, nondistended. No hepatosplenomegaly. No bruits or masses. Good bowel sounds. Extremities: no cyanosis, clubbing, rash, edema Neuro: alert & orientedx3, cranial nerves grossly intact. moves all 4 extremities w/o difficulty. Affect pleasant   Telemetry   Sinus 70s Personally reviewed   Labs    CBC Recent Labs    05/11/19 2325 05/11/19 2325 05/12/19 0232 05/12/19 0330  WBC 17.0*  --  17.8*  --   HGB 10.3*   < > 10.3* 11.2*  HCT 33.3*   < > 34.1* 33.0*  MCV 96.2  --  96.9  --   PLT 276  --  303  --    < > = values in this interval not displayed.   Basic Metabolic Panel Recent Labs    05/11/19 0328 05/11/19 0328 05/11/19 1502 05/11/19 1502 05/12/19 0232 05/12/19 0330  NA 136  135   < > 135   < >  134* 135  K 4.4  4.4   < > 4.4   < > 4.1 4.5  CL 104  103   < > 103  --  101  --   CO2 27  26   < > 26  --  25  --   GLUCOSE 127*  128*   < > 142*  --  199*  --   BUN 34*  34*   < > 33*  --  30*  --   CREATININE 1.03  1.04   < > 1.04  --  0.93  --   CALCIUM 7.8*  7.7*   < > 7.7*  --  7.7*  --   MG 2.6*  --   --   --  2.5*  --   PHOS 2.9   < > 3.0  --  2.9  --    < > = values in this interval not displayed.   Liver Function Tests Recent Labs    05/11/19 0328 05/11/19 0328 05/11/19 1502 05/12/19 0232  AST 46*  --   --  43*  ALT 79*  --   --  66*  ALKPHOS 531*  --   --  504*  BILITOT 0.9  --   --  1.1  PROT 6.0*  --   --  6.4*  ALBUMIN 1.7*  1.6*   < > 1.6* 1.7*   < > = values  in this interval not displayed.   No results for input(s): LIPASE, AMYLASE in the last 72 hours. Cardiac Enzymes No results for input(s): CKTOTAL, CKMB, CKMBINDEX, TROPONINI in the last 72 hours.  BNP: BNP (last 3 results) Recent Labs    04/13/2019 0113  BNP 655.7*    ProBNP (last 3 results) No results for input(s): PROBNP in the last 8760 hours.   D-Dimer No results for input(s): DDIMER in the last 72 hours. Hemoglobin A1C No results for input(s): HGBA1C in the last 72 hours. Fasting Lipid Panel No results for input(s): CHOL, HDL, LDLCALC, TRIG, CHOLHDL, LDLDIRECT in the last 72 hours. Thyroid Function Tests No results for input(s): TSH, T4TOTAL, T3FREE, THYROIDAB in the last 72 hours.  Invalid input(s): FREET3  Other results:   Imaging    DG CHEST PORT 1 VIEW  Result Date: 05/12/2019 CLINICAL DATA:  BILATERAL pleural effusions, pneumonia, hypertension, type II diabetes mellitus, history CHF, NSTEMI, coronary artery disease post CABG EXAM: PORTABLE CHEST 1 VIEW COMPARISON:  Portable exam 0521 hours compared to 05/10/2019 FINDINGS: Tracheostomy tube and LEFT jugular dual-lumen central venous catheter unchanged. Tip of RIGHT arm PICC line projects over RIGHT atrium. Epicardial pacing wires noted. Stable heart size post CABG and MVR. Diffuse BILATERAL pulmonary infiltrates which could represent pulmonary edema and/or multifocal pneumonia, slightly increased. No pleural effusion or pneumothorax. IMPRESSION: BILATERAL pulmonary infiltrates question pulmonary edema and/or multifocal pneumonia, slightly increased from previous exam. Electronically Signed   By: Lavonia Dana M.D.   On: 05/12/2019 08:15     Medications:     Scheduled Medications: . amiodarone  100 mg Per Tube Daily  . aspirin EC  325 mg Oral Daily   Or  . aspirin  324 mg Per Tube Daily  . B-complex with vitamin C  1 tablet Per Tube Daily  . chlorhexidine gluconate (MEDLINE KIT)  15 mL Mouth Rinse BID  .  Chlorhexidine Gluconate Cloth  6 each Topical Daily  . docusate  200 mg Per Tube Daily  . feeding supplement (PRO-STAT SUGAR FREE  64)  60 mL Oral Q6H  . gabapentin  300 mg Per Tube Q8H  . insulin aspart  0-24 Units Subcutaneous Q4H  . insulin aspart  4 Units Subcutaneous Q4H  . insulin glargine  10 Units Subcutaneous BID  . mouth rinse  15 mL Mouth Rinse 10 times per day  . metoCLOPramide (REGLAN) injection  10 mg Intravenous Q8H  . midodrine  15 mg Per Tube TID WC  . pantoprazole sodium  40 mg Per Tube Daily  . pneumococcal 23 valent vaccine  0.5 mL Intramuscular Tomorrow-1000  . sodium chloride flush  10-40 mL Intracatheter Q12H    Infusions: .  prismasol BGK 4/2.5 400 mL/hr at 05/12/19 0212  .  prismasol BGK 4/2.5 200 mL/hr at 05/11/19 2219  . sodium chloride 10 mL/hr at 05/06/19 0600  . epinephrine Stopped (04/28/19 0138)  . feeding supplement (VITAL 1.5 CAL) 1,000 mL (05/11/19 2154)  . fentaNYL infusion INTRAVENOUS Stopped (04/30/19 1211)  . heparin 10,000 units/ 20 mL infusion syringe 1,000 Units/hr (05/12/19 0120)  . lactated ringers Stopped (05/09/2019 1640)  . lactated ringers Stopped (04/30/19 0930)  . milrinone 0.125 mcg/kg/min (05/12/19 0800)  . norepinephrine (LEVOPHED) Adult infusion 12 mcg/min (05/12/19 0800)  . piperacillin-tazobactam Stopped (05/12/19 7371)  . prismasol BGK 4/2.5 1,800 mL/hr at 05/12/19 0608  . vasopressin (PITRESSIN) infusion - *FOR SHOCK* 0.02 Units/min (05/12/19 0800)    PRN Medications: Place/Maintain arterial line **AND** sodium chloride, alteplase, fentaNYL, fentaNYL (SUBLIMAZE) injection, Gerhardt's butt cream, heparin, levalbuterol, ondansetron (ZOFRAN) IV, ondansetron (ZOFRAN) IV, oxyCODONE, sodium chloride, sodium chloride flush     Assessment/Plan    1. Acute systolic HF ->  Cardiogenic Shock  - Due to iCM. EF 20-25% - Impella 5.5 placed on 4/2.   - s/p CABG/MVRepair on 4/6  - Impella out 4/9.  - Echo 4/10 with EF 25-30%, normal  RV, no MR.  - Echo 4/22 EF 30-35% mildly reduced RV MVR ok   - Remains on CVVH. CVP 9. Weight well below baseline but CXR worse. Will start to pull with CVVHD again.  - Remains on norepinephrine 12 + milrinone 0.125. VP restarted overnight On midodrine 15 tid. Co-ox 62%. We seem to be stuck.  - May have to let CVP drift up a bit to help wean pressors but respiratory status hasn't tolerated  2. CAD - LHC with severe 3 vessel CAD  - No s/s angina - s/p CABG x 3 MV Repair 4/6. Echo stable - on ASA/Crestor  3. Acute hypoxic respiratory failure - Due to pulmonary edema and PNA - Extubated 4/9 re-intubated on 4/12. Failed re-extubation on 4/14. Now s/p tracheostomy - Remains on zosyn/linezolid - CXR worse again. Will resume pulling fluid with CVVHD.  - ID recommends not treating yeast on sputum cx just yet (suspect colonization) - Dr. Prescott Gum checking ESR to exclude pneumonitis/amio toxicity - ? Need for bronch - May need LTAC  4. Mitral Regurgitation - Mod-severe on ECHO - s/p MV repair, TEE 4/9 with minimal MR s/p repair.   - Echo 4/10 with no significant MR.  - echo 4/22 MVR stable  5. Uncontrolled DM - Hgb A1C 9.  - On insulin and sliding scale.  - No change  6. AKI - due to shock - Became anuric and now on CVVH to control volume overload.   - Remains anuric. Renal following  7. Acute blood loss Anemia  - HGb 7.7 today. No obvious bleeding.  - transfuse hgb < 7.5  8. Polymorphic VT - Recurrent VT during respiratory distress 4/14, - Will stop amio  - no further VT on tele - Keep K> 4.0 Mg > 2.0  - no change  9. 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 No sensation R and L foot and occasionaly in his hands. Requires assistance with ADLs.   10. Severe Malnutrition - Prealbumin 8.9  - Nutrition on board - Continue w/ TFs  11. Elevated LFTs - Suspect shock liver - AST/ALT remain stably elevated.  12. Atrial fibrillation. paroxysmal -  Remains in  NSR. Will stop amio given lung disease  13. Debility, severe - will need SNF on d/c  14. Chronic pain issues - pain well controlled on current regimen  CRITICAL CARE Performed by: Glori Bickers  Total critical care time: 45 minutes  Critical care time was exclusive of separately billable procedures and treating other patients.  Critical care was necessary to treat or prevent imminent or life-threatening deterioration.  Critical care was time spent personally by me (independent of midlevel providers or residents) on the following activities: development of treatment plan with patient and/or surrogate as well as nursing, discussions with consultants, evaluation of patient's response to treatment, examination of patient, obtaining history from patient or surrogate, ordering and performing treatments and interventions, ordering and review of laboratory studies, ordering and review of radiographic studies, pulse oximetry and re-evaluation of patient's condition.  Glori Bickers, MD  8:29 AM

## 2019-05-12 NOTE — Progress Notes (Signed)
10 Days Post-Op Procedure(s) (LRB): VIDEO BRONCHOSCOPY USING DISPOSABLE ANESTHESIA SCOPE (N/A) TRACHEOSTOMY (N/A) On CPAP due to low O2 sat, worse pulmonary infiltrates CVP low Complains total body pain- on sarboxone preop Objective: Vital signs in last 24 hours: Temp:  [97.3 F (36.3 C)-97.8 F (36.6 C)] 97.6 F (36.4 C) (04/26 0500) Pulse Rate:  [34-105] 69 (04/26 0700) Cardiac Rhythm: Normal sinus rhythm (04/26 0400) Resp:  [9-19] 13 (04/26 0755) BP: (91-127)/(45-75) 97/60 (04/26 0755) SpO2:  [86 %-100 %] 92 % (04/26 0755) FiO2 (%):  [40 %-70 %] 60 % (04/26 0755) Weight:  [57.8 kg] 57.8 kg (04/26 0415)  Hemodynamic parameters for last 24 hours: CVP:  [5 mmHg-9 mmHg] 9 mmHg  Intake/Output from previous day: 04/25 0701 - 04/26 0700 In: 2448.9 [I.V.:562.1; Blood:315; NG/GT:1410; IV Piggyback:161.8] Out: 2330 [Stool:810] Intake/Output this shift: No intake/output data recorded.       Exam    General- alert and comfortable on trach    Neck- no JVD, no cervical adenopathy palpable, no carotid bruit   Lungs- faint dryrales,  No wheezes   Cor- regular rate and rhythm, no murmur , gallop   Abdomen- soft, non-tender   Extremities - warm, non-tender, minimal edema   Neuro- oriented, appropriate, no focal weakness   Lab Results: Recent Labs    05/11/19 2325 05/11/19 2325 05/12/19 0232 05/12/19 0330  WBC 17.0*  --  17.8*  --   HGB 10.3*   < > 10.3* 11.2*  HCT 33.3*   < > 34.1* 33.0*  PLT 276  --  303  --    < > = values in this interval not displayed.   BMET:  Recent Labs    05/11/19 1502 05/11/19 1502 05/12/19 0232 05/12/19 0330  NA 135   < > 134* 135  K 4.4   < > 4.1 4.5  CL 103  --  101  --   CO2 26  --  25  --   GLUCOSE 142*  --  199*  --   BUN 33*  --  30*  --   CREATININE 1.04  --  0.93  --   CALCIUM 7.7*  --  7.7*  --    < > = values in this interval not displayed.    PT/INR: No results for input(s): LABPROT, INR in the last 72 hours. ABG     Component Value Date/Time   PHART 7.303 (L) 05/12/2019 0330   HCO3 28.5 (H) 05/12/2019 0330   TCO2 30 05/12/2019 0330   ACIDBASEDEF 3.0 (H) 04/28/2019 0735   O2SAT 90.0 05/12/2019 0330   CBG (last 3)  Recent Labs    05/12/19 0022 05/12/19 0403 05/12/19 0747  GLUCAP 179* 154* 90    Assessment/Plan: S/P Procedure(s) (LRB): VIDEO BRONCHOSCOPY USING DISPOSABLE ANESTHESIA SCOPE (N/A) TRACHEOSTOMY (N/A) Cont current support- increase pain meds Try to reduce amiodarone Check ESR for inflammatory infiltrates prior cortisol level ok  LOS: 28 days    [name] 05/12/2019

## 2019-05-12 NOTE — Progress Notes (Signed)
Tried to transfer patient onto the trach collar 10L/ FiO2- 60%. Patients O2 dropped to mid 70s. Patient placed back on PS/CPAP of 5/5 AND 50% FiO2. o2 Saturation went back up to mid 90s.

## 2019-05-12 NOTE — Progress Notes (Signed)
Pharmacy Antibiotic Note  Brendan [name] is a 64 y.o. male s/p CABG/MVR 4/6, on CRRT.  Pharmacy had been consulted for Zosyn for PNA and possible aspiration event after VT arrest.   Remains on CRRT. WBC at 17.8. Afebrile. ID recommends to not treat yeast in trach culture and stop linezolid.  Plan: Zosyn 3.375 g IV q6h while on CRRT - antibiotics stopped after today Monitor CRRT tolerance/interruptions, clinical progress, LOT, repeat cultures   Height: _0  (180.3 cm)(measured x 3) Weight: 57.8 kg (127 lb 6.8 oz) IBW/kg (Calculated) : 75.3  Temp (24hrs), Avg:97.4 F (36.3 C), Min:97.3 F (36.3 C), Max:97.8 F (36.6 C)  Recent Labs  Lab 05/09/19 0343 05/09/19 1620 05/10/19 0329 05/10/19 1606 05/11/19 0328 05/11/19 1502 05/11/19 2325 05/12/19 0232  WBC 15.8*  --  15.6*  --  15.7*  --  17.0* 17.8*  CREATININE 0.95   < > 1.00 0.97 1.03  1.04 1.04  --  0.93   < > = values in this interval not displayed.    Estimated Creatinine Clearance: 68.2 mL/min (by C-G formula based on SCr of 0.93 mg/dL).    Allergies  Allergen Reactions  . Acetaminophen Itching   Cefepime 4/4>4/14 Vanc 4/4>4/4; resume 4/12 > 4/15 Rocephin 4/4 > 4/6 Ampicillin 4/12>4/12 Unasyn 4/16>4/20 Linezolid 4/20>4/23 Zosyn 4/20>  4/2 R pleural fluid > neg 4/4 BCx x 2 > neg 4/9 BCx - negative 4/9 Wound Cx > rare enterococcus faecalis (pan-S) 4/9 UCx >  > 100K yeast 4/19 BAL - rare E.faecalis (pan sensitive) 4/12 TA > ngF 4/16 BAL > ngF 4/20 TA > rare yeast 4/20 BCx > ngtd 4/21 TA > rare yeast  Antonietta Jewel, PharmD, BCCCP Clinical Pharmacist  Phone: [phone] 05/12/2019 3:19 PM  Please check AMION for all [place] phone numbers After 10:00 PM, call [place] [phone]

## 2019-05-12 NOTE — Evaluation (Signed)
Occupational Therapy Re-Evaluation Patient Details Name: Brendan [name] MRN: [id number] DOB: Dec 03, 1955 Today's Date: 05/12/2019    History of Present Illness Pt presented with SOB and fatigue at [facility] found to have new HFrEF,  NSTEMI, and bilateral pleural effusions transferred to [facility] on 3/29 for further cardiac evaluation.  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  On the evening of 4/1, patient developed worsening respiratory distress and hypoxia, head R/L heart cath on 3/30 & R heart cath on 4/1 pt intubated on 4/1, extubated 4/4, had R & L chest tubes placed 4/2 due to bilateral pleural effusions, removed 4/5 and impella device placed 4/2.  CABG and MVR on 4/6. Extubated 4/7. Removal of impella 4/9. CVVH started 4/10. Respiratory distress and reintubated 4/12. Extubated 4/14. Reintubated 4/15. Trach 4/16.  PMHx of Tobacco abuse, HTN, poorly controlled DM, diabetic neuropathy   Clinical Impression   Pt re-eval s/p medical changes. Pt now on CRRT. Pt agreeable to egress bed for sit to stands. Pt maxA+2 with b/l knees blocked for sit to stands; pt tolerating ~1 min of standing for RN to perform pericare. Pt performing BUE exercises, AROM 10-20 reps. VSS throughout. Pt maxA+2 for bed mobility and transfers. Pt to try stedy next sessions to perform multiple transfers. Pt maxA to totalA overall for ADL. Pt decreased ability to care for self, decreased strength, and decreased activity tolerance for ADL. Pt would greatly benefit from continued OT skilled services. Goals updated and POC updated. OT following acutely.     Follow Up Recommendations  CIR;Supervision/Assistance - 24 hour    Equipment Recommendations  3 in 1 bedside commode    Recommendations for Other Services       Precautions / Restrictions Precautions Precautions: Fall;Sternal Precaution Comments: CRRT, flexiseal, trach on PS/CPAP 60% FiO2 PEEP 5 Restrictions Weight Bearing Restrictions: Yes      Mobility Bed Mobility Overal  bed mobility: Needs Assistance             General bed mobility comments: used bed egress mode with assist for scooting out to edge of seat/bed, then return to semi-reclined  Transfers Overall transfer level: Needs assistance Equipment used: Rolling walker (2 wheeled);2 person hand held assist Transfers: Sit to/from Stand Sit to Stand: Max assist;+2 physical assistance;From elevated surface         General transfer comment: b/l knees blocked for HHA to stand at egressed bed.    Balance Overall balance assessment: Needs assistance   Sitting balance-Leahy Scale: Poor Sitting balance - Comments: edge of seat needing min support for balance   Standing balance support: Bilateral upper extremity supported Standing balance-Leahy Scale: Poor Standing balance comment: x1 sit to stand from bed with RW, but unable to stand upright with knees buckling. Pt sit to stand x1 with b/l knees blocked and hand heldA on either side with better results >1 min of standing for peri care by RN.                           ADL either performed or assessed with clinical judgement   ADL Overall ADL's : Needs assistance/impaired                             Toileting- Clothing Manipulation and Hygiene: Maximal assistance;+2 for physical assistance;+2 for safety/equipment;Sit to/from stand Toileting - Clothing Manipulation Details (indicate cue type and reason): stood x1 min for pericare  Functional mobility during ADLs: Maximal assistance;+2 for physical assistance;+2 for safety/equipment;Cueing for safety;Cueing for sequencing General ADL Comments: Pt with decreased ability to care for self, decreased strength, decreased mobility and decreased activity tolerance.     Vision Baseline Vision/History: Wears glasses Wears Glasses: Reading only       Perception     Praxis      Pertinent Vitals/Pain Pain Assessment: Faces Faces Pain Scale: No hurt Pain Intervention(s):  Monitored during session     Hand Dominance Right   Extremity/Trunk Assessment Upper Extremity Assessment Upper Extremity Assessment: Generalized weakness RUE Deficits / Details: FF to 90* nothing beyond due to impella; elbow through digits, WFLs. LUE Deficits / Details: WNLs, full ROM   Lower Extremity Assessment Lower Extremity Assessment: Generalized weakness       Communication Communication Communication: Tracheostomy   Cognition Arousal/Alertness: Awake/alert Behavior During Therapy: Flat affect Overall Cognitive Status: Difficult to assess                                 General Comments: follows 1 step commands with increased time   General Comments  VSS wtih activity >90% O2 60% on 5 PEEP.    Exercises Exercises: Other exercises General Exercises - Lower Extremity Long Arc Quad: AROM;Seated;Other (comment)(8 reps) Other Exercises Other Exercises: shoulder FF, b/l punches, elbow flex/ext and hand squeezes x10 reps   Shoulder Instructions      Home Living Family/patient expects to be discharged to:: Private residence Living Arrangements: Spouse/significant other Available Help at Discharge: Family Type of Home: Apartment Home Access: Level entry     Home Layout: One level     Bathroom Shower/Tub: Teacher, early years/pre: Standard     Home Equipment: Environmental consultant - 2 wheels;Cane - single point          Prior Functioning/Environment Level of Independence: Independent                 OT Problem List: Decreased strength;Decreased activity tolerance;Impaired balance (sitting and/or standing);Decreased cognition;Decreased safety awareness;Decreased knowledge of precautions;Pain;Increased edema;Decreased knowledge of use of DME or AE      OT Treatment/Interventions: Self-care/ADL training;Therapeutic exercise;Neuromuscular education;Energy conservation;DME and/or AE instruction;Therapeutic activities;Patient/family  education;Visual/perceptual remediation/compensation;Other (comment);Cognitive remediation/compensation    OT Goals(Current goals can be found in the care plan section) Acute Rehab OT Goals Patient Stated Goal: not stated OT Goal Formulation: With patient Time For Goal Achievement: 05/26/19 Potential to Achieve Goals: Good ADL Goals Pt Will Perform Grooming: with supervision;sitting Pt Will Perform Lower Body Dressing: with min assist;with adaptive equipment;sitting/lateral leans;sit to/from stand Pt/caregiver will Perform Home Exercise Program: Increased strength;Both right and left upper extremity;With Supervision Additional ADL Goal #1: Pt  will verbalize 3 strategies to conserve energy during functional ADL and mobility tasks with no verbal cues Additional ADL Goal #2: Pt will increase to minA overall for bed mobility as precursor for OOB ADL.  OT Frequency: Min 2X/week   Barriers to D/C:            Co-evaluation PT/OT/SLP Co-Evaluation/Treatment: Yes Reason for Co-Treatment: Complexity of the patient's impairments (multi-system involvement);For patient/therapist safety PT goals addressed during session: Mobility/safety with mobility;Strengthening/ROM OT goals addressed during session: Strengthening/ROM      AM-PAC OT "6 Clicks" Daily Activity     Outcome Measure Help from another person eating meals?: A Little Help from another person taking care of personal grooming?: A Lot Help from another person toileting,  which includes using toliet, bedpan, or urinal?: Total Help from another person bathing (including washing, rinsing, drying)?: Total Help from another person to put on and taking off regular upper body clothing?: A Lot Help from another person to put on and taking off regular lower body clothing?: Total 6 Click Score: 10   End of Session Equipment Utilized During Treatment: Gait belt;Oxygen Nurse Communication: Mobility status  Activity Tolerance: Patient tolerated  treatment well;Patient limited by fatigue Patient left: in bed;with call bell/phone within reach;with nursing/sitter in room  OT Visit Diagnosis: Unsteadiness on feet (R26.81);Muscle weakness (generalized) (M62.81)                Time: 1188-6773 OT Time Calculation (min): 23 min Charges:  OT General Charges $OT Visit: 1 Visit OT Evaluation $OT Re-eval: 1 Re-eval  Jefferey Pica, OTR/L Acute Rehabilitation Services Pager: [phone] Office: [phone]   Brendan [name] C 05/12/2019, 2:14 PM

## 2019-05-13 ENCOUNTER — Inpatient Hospital Stay (HOSPITAL_COMMUNITY): Payer: Medicaid Other

## 2019-05-13 LAB — COOXEMETRY PANEL
Carboxyhemoglobin: 2.2 % — ABNORMAL HIGH (ref 0.5–1.5)
Methemoglobin: 1.2 % (ref 0.0–1.5)
O2 Saturation: 67.7 %
Total hemoglobin: 10.6 g/dL — ABNORMAL LOW (ref 12.0–16.0)

## 2019-05-13 LAB — CBC
HCT: 33.9 % — ABNORMAL LOW (ref 39.0–52.0)
Hemoglobin: 10.4 g/dL — ABNORMAL LOW (ref 13.0–17.0)
MCH: 30 pg (ref 26.0–34.0)
MCHC: 30.7 g/dL (ref 30.0–36.0)
MCV: 97.7 fL (ref 80.0–100.0)
Platelets: 253 10*3/uL (ref 150–400)
RBC: 3.47 MIL/uL — ABNORMAL LOW (ref 4.22–5.81)
RDW: 18.5 % — ABNORMAL HIGH (ref 11.5–15.5)
WBC: 16.3 10*3/uL — ABNORMAL HIGH (ref 4.0–10.5)
nRBC: 0 % (ref 0.0–0.2)

## 2019-05-13 LAB — POCT I-STAT 7, (LYTES, BLD GAS, ICA,H+H)
Acid-Base Excess: 2 mmol/L (ref 0.0–2.0)
Bicarbonate: 28.2 mmol/L — ABNORMAL HIGH (ref 20.0–28.0)
Calcium, Ion: 1.25 mmol/L (ref 1.15–1.40)
HCT: 34 % — ABNORMAL LOW (ref 39.0–52.0)
Hemoglobin: 11.6 g/dL — ABNORMAL LOW (ref 13.0–17.0)
O2 Saturation: 92 %
Potassium: 4.3 mmol/L (ref 3.5–5.1)
Sodium: 136 mmol/L (ref 135–145)
TCO2: 30 mmol/L (ref 22–32)
pCO2 arterial: 52.5 mmHg — ABNORMAL HIGH (ref 32.0–48.0)
pH, Arterial: 7.338 — ABNORMAL LOW (ref 7.350–7.450)
pO2, Arterial: 70 mmHg — ABNORMAL LOW (ref 83.0–108.0)

## 2019-05-13 LAB — RENAL FUNCTION PANEL
Albumin: 1.7 g/dL — ABNORMAL LOW (ref 3.5–5.0)
Albumin: 2.6 g/dL — ABNORMAL LOW (ref 3.5–5.0)
Anion gap: 6 (ref 5–15)
Anion gap: 8 (ref 5–15)
BUN: 32 mg/dL — ABNORMAL HIGH (ref 8–23)
BUN: 32 mg/dL — ABNORMAL HIGH (ref 8–23)
CO2: 27 mmol/L (ref 22–32)
CO2: 25 mmol/L (ref 22–32)
Calcium: 7.9 mg/dL — ABNORMAL LOW (ref 8.9–10.3)
Calcium: 8.3 mg/dL — ABNORMAL LOW (ref 8.9–10.3)
Chloride: 100 mmol/L (ref 98–111)
Chloride: 100 mmol/L (ref 98–111)
Creatinine, Ser: 0.88 mg/dL (ref 0.61–1.24)
Creatinine, Ser: 0.87 mg/dL (ref 0.61–1.24)
GFR calc Af Amer: >60 mL/min (ref >60)
GFR calc Af Amer: >60 mL/min (ref >60)
GFR calc non Af Amer: >60 mL/min (ref >60)
GFR calc non Af Amer: >60 mL/min (ref >60)
Glucose, Bld: 239 mg/dL — ABNORMAL HIGH (ref 70–99)
Glucose, Bld: 171 mg/dL — ABNORMAL HIGH (ref 70–99)
Phosphorus: 2.8 mg/dL (ref 2.5–4.6)
Phosphorus: 3.3 mg/dL (ref 2.5–4.6)
Potassium: 4.1 mmol/L (ref 3.5–5.1)
Potassium: 4.7 mmol/L (ref 3.5–5.1)
Sodium: 133 mmol/L — ABNORMAL LOW (ref 135–145)
Sodium: 133 mmol/L — ABNORMAL LOW (ref 135–145)

## 2019-05-13 LAB — HEPATIC FUNCTION PANEL
ALT: 64 U/L — ABNORMAL HIGH (ref 0–44)
AST: 53 U/L — ABNORMAL HIGH (ref 15–41)
Albumin: 1.7 g/dL — ABNORMAL LOW (ref 3.5–5.0)
Alkaline Phosphatase: 525 U/L — ABNORMAL HIGH (ref 38–126)
Bilirubin, Direct: 0.3 mg/dL — ABNORMAL HIGH (ref 0.0–0.2)
Indirect Bilirubin: 0.5 mg/dL (ref 0.3–0.9)
Total Bilirubin: 0.8 mg/dL (ref 0.3–1.2)
Total Protein: 6.6 g/dL (ref 6.5–8.1)

## 2019-05-13 LAB — GLUCOSE, CAPILLARY
Glucose-Capillary: 102 mg/dL — ABNORMAL HIGH (ref 70–99)
Glucose-Capillary: 135 mg/dL — ABNORMAL HIGH (ref 70–99)
Glucose-Capillary: 137 mg/dL — ABNORMAL HIGH (ref 70–99)
Glucose-Capillary: 137 mg/dL — ABNORMAL HIGH (ref 70–99)
Glucose-Capillary: 206 mg/dL — ABNORMAL HIGH (ref 70–99)
Glucose-Capillary: 210 mg/dL — ABNORMAL HIGH (ref 70–99)
Glucose-Capillary: 81 mg/dL (ref 70–99)

## 2019-05-13 LAB — MAGNESIUM: Magnesium: 2.5 mg/dL — ABNORMAL HIGH (ref 1.7–2.4)

## 2019-05-13 MED ORDER — OXYCODONE HCL 5 MG PO TABS
10.0000 mg | ORAL_TABLET | Freq: Four times a day (QID) | ORAL | Status: DC
  Administered 2019-05-13 – 2019-05-20 (×28): 10 mg
  Filled 2019-05-13 (×28): qty 2

## 2019-05-13 MED ORDER — PANTOPRAZOLE SODIUM 40 MG PO PACK
40.0000 mg | PACK | Freq: Two times a day (BID) | ORAL | Status: DC
  Administered 2019-05-13 – 2019-06-20 (×78): 40 mg
  Filled 2019-05-13 (×79): qty 20

## 2019-05-13 MED ORDER — MIDODRINE HCL 5 MG PO TABS
15.0000 mg | ORAL_TABLET | Freq: Three times a day (TID) | ORAL | Status: DC
  Administered 2019-05-13 – 2019-05-24 (×33): 15 mg
  Filled 2019-05-13 (×33): qty 3

## 2019-05-13 MED ORDER — NEOMYCIN-POLYMYXIN-HC 3.5-10000-1 OT SUSP
3.0000 [drp] | Freq: Two times a day (BID) | OTIC | Status: AC
  Administered 2019-05-13 – 2019-05-22 (×19): 3 [drp] via OTIC
  Filled 2019-05-13 (×2): qty 10

## 2019-05-13 MED ORDER — ALBUMIN HUMAN 25 % IV SOLN
12.5000 g | Freq: Four times a day (QID) | INTRAVENOUS | Status: AC
  Administered 2019-05-13 (×3): 12.5 g via INTRAVENOUS
  Filled 2019-05-13 (×2): qty 50

## 2019-05-13 MED ORDER — MIDAZOLAM HCL 2 MG/2ML IJ SOLN
INTRAMUSCULAR | Status: AC
  Administered 2019-05-13: 1 mg
  Filled 2019-05-13: qty 2

## 2019-05-13 MED ORDER — FENTANYL CITRATE (PF) 100 MCG/2ML IJ SOLN: 100.0000 ug | Freq: Once | INTRAMUSCULAR | Status: AC

## 2019-05-13 MED ORDER — OXYCODONE HCL 5 MG PO TABS: 5.0000 mg | ORAL_TABLET | Freq: Four times a day (QID) | ORAL | Status: DC

## 2019-05-13 MED ORDER — FENTANYL CITRATE (PF) 100 MCG/2ML IJ SOLN
INTRAMUSCULAR | Status: AC
  Administered 2019-05-13: 100 ug
  Filled 2019-05-13: qty 2

## 2019-05-13 MED ORDER — VITAL 1.5 CAL PO LIQD
1000.0000 mL | ORAL | Status: DC
  Administered 2019-05-13 – 2019-05-18 (×6): 1000 mL
  Filled 2019-05-13 (×8): qty 1000

## 2019-05-13 MED ORDER — FENTANYL CITRATE (PF) 100 MCG/2ML IJ SOLN
INTRAMUSCULAR | Status: AC
  Administered 2019-05-13: 100 ug via INTRAVENOUS
  Filled 2019-05-13: qty 2

## 2019-05-13 MED ORDER — METHYLPREDNISOLONE SODIUM SUCC 125 MG IJ SOLR
80.0000 mg | Freq: Two times a day (BID) | INTRAMUSCULAR | Status: AC
  Administered 2019-05-13 – 2019-05-14 (×4): 80 mg via INTRAVENOUS
  Filled 2019-05-13 (×4): qty 2

## 2019-05-13 MED ORDER — PRO-STAT SUGAR FREE PO LIQD
30.0000 mL | Freq: Three times a day (TID) | ORAL | Status: DC
  Administered 2019-05-13 – 2019-05-19 (×18): 30 mL via ORAL
  Filled 2019-05-13 (×19): qty 30

## 2019-05-13 MED ORDER — MIDAZOLAM HCL 2 MG/2ML IJ SOLN: 2.0000 mg | Freq: Once | INTRAMUSCULAR | Status: AC

## 2019-05-13 MED ORDER — MIDAZOLAM HCL 2 MG/2ML IJ SOLN
INTRAMUSCULAR | Status: AC
  Filled 2019-05-13: qty 4

## 2019-05-13 NOTE — Progress Notes (Addendum)
Patient ID: Brendan [name], male   DOB: Jul 22, 1955, 64 y.o.   MRN: [id number]     Advanced Heart Failure Rounding Note  PCP-Cardiologist: No primary care provider on file.   Subjective:    Events - Presented to [facility] with acute HF  EF 20-25% - Transferred [facility] - Cath 3/30 with severe 3 vessel disease. EF 25% - Developed PMVT on milrinone -> milrinone stopped -> developed worsening shock -> IABP placed on 4/1 - Clinical deterioration 4/1-> intubated - 4/2 underwent Impella 5.5 placement earlier and bilateral chest tubes with 3L out from each side.  - Extubated 4/4 - 04/23/2019 CABG and MVR - Extubated 4/7 - Back to OR for Impella 5.5 extraction 4/9, extubated.  TEE with EF 35%, RV ok, trivial MR s/p MV repair.  - CVVH started 4/10 with oliguria, rising creatinine and CVP.  - Deteriorating respiratory status, intubated again early am 4/12.  Enterococcus faecalis in sputum.  - Extubated 4/14.  Developed respiratory distress and started on Bipap.  Had VT arrest with CPR/epinephrine, intubated again 4/15 early am.  - Tracheostomy 4/16 - Atrial fibrillation early am 4/17 -Echo 4/22: EF 30-35% RV mildly reduced. MVR looks good.   Have been keeping even on CVVHD.   Remains on NE 13 , VP 0.02. Milrinone 0.125. CO-OX 68%.   Complaining of pain. Requiring pain medication every 2 hours. Yesterday he had 14 doses of prn fentanyl.     Objective:   Weight Range: 57.8 kg Body mass index is 17.77 kg/m.   Vital Signs:   Temp:  [97.5 F (36.4 C)-98.7 F (37.1 C)] 97.7 F (36.5 C) (04/27 0000) Pulse Rate:  [32-92] 72 (04/27 0600) Resp:  [8-36] 11 (04/27 0600) BP: (81-122)/(43-103) 109/56 (04/27 0600) SpO2:  [81 %-100 %] 93 % (04/27 0600) FiO2 (%):  [50 %-60 %] 50 % (04/27 0356) Last BM Date: 05/12/19  Weight change: Filed Weights   05/10/19 0415 05/11/19 0500 05/12/19 0415  Weight: 57.4 kg 57.9 kg 57.8 kg    Intake/Output:   Intake/Output Summary (Last 24 hours) at  05/13/2019 0708 Last data filed at 05/13/2019 0600 Gross per 24 hour  Intake 2528.02 ml  Output 4034 ml  Net -1505.98 ml      Physical Exam   General:  Thin. Trach/Vent R subclavian HD catheter HEENT: normal Neck: Trach, supple. No JVD. Carotids 2+ bilat; no bruits. No lymphadenopathy or thryomegaly appreciated. Cor: PMI nondisplaced. Regular rate & rhythm. No rubs, gallops or murmurs.  Lungs: Coarse throughout  Abdomen: soft, nontender, nondistended. No hepatosplenomegaly. No bruits or masses. Good bowel sounds. Extremities: no cyanosis, clubbing, rash, R and LLE compression wraps. RUE PICC Neuro: alert  MAE x4.     Telemetry   Sinus Rhythm 60-70s with occasional PVCs.    Labs    CBC Recent Labs    05/12/19 0232 05/12/19 0232 05/12/19 0330 05/13/19 0555  WBC 17.8*  --   --  16.3*  HGB 10.3*   < > 11.2* 10.4*  HCT 34.1*   < > 33.0* 33.9*  MCV 96.9  --   --  97.7  PLT 303  --   --  253   < > = values in this interval not displayed.   Basic Metabolic Panel Recent Labs    05/11/19 0328 05/11/19 1502 05/12/19 0232 05/12/19 0232 05/12/19 0330 05/12/19 1600  NA 136  135   < > 134*   < > 135 133*  K 4.4  4.4   < >  4.1   < > 4.5 4.7  CL 104  103   < > 101  --   --  100  CO2 27  26   < > 25  --   --  25  GLUCOSE 127*  128*   < > 199*  --   --  132*  BUN 34*  34*   < > 30*  --   --  31*  CREATININE 1.03  1.04   < > 0.93  --   --  0.95  CALCIUM 7.8*  7.7*   < > 7.7*  --   --  7.8*  MG 2.6*  --  2.5*  --   --   --   PHOS 2.9   < > 2.9  --   --  2.8   < > = values in this interval not displayed.   Liver Function Tests Recent Labs    05/11/19 0328 05/11/19 1502 05/12/19 0232 05/12/19 1600  AST 46*  --  43*  --   ALT 79*  --  66*  --   ALKPHOS 531*  --  504*  --   BILITOT 0.9  --  1.1  --   PROT 6.0*  --  6.4*  --   ALBUMIN 1.7*  1.6*   < > 1.7* 1.7*   < > = values in this interval not displayed.   No results for input(s): LIPASE, AMYLASE in the  last 72 hours. Cardiac Enzymes No results for input(s): CKTOTAL, CKMB, CKMBINDEX, TROPONINI in the last 72 hours.  BNP: BNP (last 3 results) Recent Labs    03/23/2019 0113  BNP 655.7*    ProBNP (last 3 results) No results for input(s): PROBNP in the last 8760 hours.   D-Dimer No results for input(s): DDIMER in the last 72 hours. Hemoglobin A1C No results for input(s): HGBA1C in the last 72 hours. Fasting Lipid Panel No results for input(s): CHOL, HDL, LDLCALC, TRIG, CHOLHDL, LDLDIRECT in the last 72 hours. Thyroid Function Tests No results for input(s): TSH, T4TOTAL, T3FREE, THYROIDAB in the last 72 hours.  Invalid input(s): FREET3  Other results:   Imaging    No results found.   Medications:     Scheduled Medications: . amiodarone  100 mg Per Tube Daily  . aspirin EC  325 mg Oral Daily   Or  . aspirin  324 mg Per Tube Daily  . B-complex with vitamin C  1 tablet Per Tube Daily  . chlorhexidine gluconate (MEDLINE KIT)  15 mL Mouth Rinse BID  . Chlorhexidine Gluconate Cloth  6 each Topical Daily  . docusate  200 mg Per Tube Daily  . feeding supplement (PRO-STAT SUGAR FREE 64)  60 mL Oral Q6H  . gabapentin  300 mg Per Tube Q8H  . insulin aspart  0-24 Units Subcutaneous Q4H  . insulin aspart  2 Units Subcutaneous Q4H  . insulin glargine  10 Units Subcutaneous BID  . mouth rinse  15 mL Mouth Rinse 10 times per day  . metoCLOPramide (REGLAN) injection  10 mg Intravenous Q8H  . midodrine  15 mg Per Tube TID WC  . pantoprazole sodium  40 mg Per Tube Daily  . pneumococcal 23 valent vaccine  0.5 mL Intramuscular Tomorrow-1000  . sodium chloride flush  10-40 mL Intracatheter Q12H    Infusions: .  prismasol BGK 4/2.5 400 mL/hr at 05/13/19 0316  .  prismasol BGK 4/2.5 200 mL/hr at 05/12/19 1049  . sodium  chloride 10 mL/hr at 05/06/19 0600  . epinephrine Stopped (04/28/19 0138)  . feeding supplement (VITAL 1.5 CAL) 1,000 mL (05/12/19 1753)  . fentaNYL infusion  INTRAVENOUS Stopped (04/30/19 1211)  . heparin 10,000 units/ 20 mL infusion syringe 1,000 Units/hr (05/13/19 0610)  . lactated ringers Stopped (05/07/2019 1640)  . lactated ringers Stopped (04/30/19 0930)  . milrinone 0.125 mcg/kg/min (05/13/19 0600)  . norepinephrine (LEVOPHED) Adult infusion 13 mcg/min (05/13/19 0600)  . prismasol BGK 4/2.5 1,800 mL/hr (05/13/19 0459)  . vasopressin (PITRESSIN) infusion - *FOR SHOCK* 0.02 Units/min (05/13/19 0600)    PRN Medications: Place/Maintain arterial line **AND** sodium chloride, alteplase, fentaNYL, fentaNYL (SUBLIMAZE) injection, Gerhardt's butt cream, heparin, levalbuterol, ondansetron (ZOFRAN) IV, ondansetron (ZOFRAN) IV, oxyCODONE, sodium chloride, sodium chloride flush     Assessment/Plan    1. Acute systolic HF ->  Cardiogenic Shock  - Due to iCM. EF 20-25% - Impella 5.5 placed on 4/2.   - s/p CABG/MVRepair on 4/6  - Impella out 4/9.  - Echo 4/10 with EF 25-30%, normal RV, no MR.  - Echo 4/22 EF 30-35% mildly reduced RV MVR ok   - Remains on CVVH.   - CO-OX 68%.  - Remains on norepinephrine 13 mcg + milrinone 0.125+ vaso 0.02 units.  -  On midodrine 15 tid.  - Using IV fentanyl multiple time during the day ~14. Add OxyContin 10 mg twice a day.  - Stop prn fentanyl  2. CAD - LHC with severe 3 vessel CAD  - No s/s angina - s/p CABG x 3 MV Repair 4/6. Echo stable - on ASA/Crestor  3. Acute hypoxic respiratory failure - Due to pulmonary edema and PNA - Extubated 4/9 re-intubated on 4/12. Failed re-extubation on 4/14. Now s/p tracheostomy -Completed antibiotic course.  -Will resume pulling fluid with CVVHD.  - ID recommends not treating yeast on sputum cx just yet (suspect colonization) - ESR 55 - May need LTAC  4. Mitral Regurgitation - Mod-severe on ECHO - s/p MV repair, TEE 4/9 with minimal MR s/p repair.   - Echo 4/10 with no significant MR.  - echo 4/22 MVR stable  5. Uncontrolled DM - Hgb A1C 9.  - On insulin  and sliding scale.  - No change  6. AKI - due to shock - Became anuric and now on CVVH to control volume overload.   - Remains anuric.  - Nephrology following.   7. Acute blood loss Anemia  - Hgb stable today 10.4  -  No obvious bleeding.  - transfuse hgb < 7.5   8. Polymorphic VT - Recurrent VT during respiratory distress 4/14, - Will stop amio  - no further VT on tele - Keep K> 4.0 Mg > 2.0  - no change  9. 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 No sensation R and L foot and occasionaly in his hands. Requires assistance with ADLs.   10. Severe Malnutrition - Prealbumin 8.9  - Nutrition on board - Continue w/ TFs  11. Elevated LFTs - Suspect shock liver - AST/ALT remain stably elevated.  12. Atrial fibrillation. paroxysmal -  Remains in NSR.  - Amiodarone was stopped.   35. Debility, severe - will need SNF on d/c  14. Chronic pain issues - Pain is not controlled. Using IV fentanyl  ~14 x a day.  - Add OxyContin.    Darrick Grinder, NP  7:08 AM   Agree with above   Remains very tenuous. On NE, VP and milrinone to support  BP. Co-ox ok. Remains anuric on CVVHD. CXR with ongoing diffuse infiltrates. Still getting a lot of pain meds  General:  Ill and weak appearing. On TC HEENT: normal Neck: supple. + TC Carotids 2+ bilat; no bruits. No lymphadenopathy or thryomegaly appreciated. Cor: PMI nondisplaced. Regular rate & rhythm. No rubs, gallops or murmurs. Elysian trialysis Lungs: coarse Abdomen: soft, nontender, nondistended. No hepatosplenomegaly. No bruits or masses. Good bowel sounds. Extremities: no cyanosis, clubbing, rash, edema Neuro: alert & orientedx3, cranial nerves grossly intact. moves all 4 extremities w/o difficulty. Affect pleasant  Still on triple pressors + midodrine to support BP and CO. Co-ox ok today. Will stop milrinone to see if this helps with BP. CXR still with diffuse infiltrates which have not responded much to fluid removal.  Suspect ARDS. Sed rate 55. Now off amio. CCM following. Resp cx from 4/26 unimpressive. ? Need for bronch. Continue CVVHD.   Will wean down IV fentanyl to oral pain meds.   CRITICAL CARE Performed by: Glori Bickers  Total critical care time: 35 minutes  Critical care time was exclusive of separately billable procedures and treating other patients.  Critical care was necessary to treat or prevent imminent or life-threatening deterioration.  Critical care was time spent personally by me (independent of midlevel providers or residents) on the following activities: development of treatment plan with patient and/or surrogate as well as nursing, discussions with consultants, evaluation of patient's response to treatment, examination of patient, obtaining history from patient or surrogate, ordering and performing treatments and interventions, ordering and review of laboratory studies, ordering and review of radiographic studies, pulse oximetry and re-evaluation of patient's condition.  Glori Bickers, MD  8:21 AM

## 2019-05-13 NOTE — Progress Notes (Addendum)
11 Days Post-Op Procedure(s) (LRB): VIDEO BRONCHOSCOPY USING DISPOSABLE ANESTHESIA SCOPE (N/A) TRACHEOSTOMY (N/A) Subjective: Hemodynamics stable, norepinephrine weaned to lower dose Normal sinus rhythm on amiodarone 100 mg daily Chest x-ray continues to significantly worsen, he has been on broad-spectrum antibiotics with several negative cultures and mild elevation white count.  ESR has increased-we will give patient short course of IV Solu-Medrol. Patient's pain medication schedule revised due to his chronic pain syndrome present preoperatively Objective: Vital signs in last 24 hours: Temp:  [97.7 F (36.5 C)-98.7 F (37.1 C)] 97.7 F (36.5 C) (04/27 0000) Pulse Rate:  [33-124] 63 (04/27 0830) Cardiac Rhythm: Normal sinus rhythm (04/27 0800) Resp:  [8-36] 15 (04/27 0830) BP: (81-122)/(43-103) 91/51 (04/27 0830) SpO2:  [81 %-100 %] 100 % (04/27 0830) FiO2 (%):  [40 %-60 %] 40 % (04/27 0800)  Hemodynamic parameters for last 24 hours: CVP:  [9 mmHg] 9 mmHg  Intake/Output from previous day: 04/26 0701 - 04/27 0700 In: 2528 [I.V.:488.1; NG/GT:1930; IV Piggyback:109.9] Out: 4172  Intake/Output this shift: Total I/O In: 221.5 [I.V.:41.5; NG/GT:180] Out: 150 [Other:150]  Exam Resting on ventilator through tracheostomy Dry rales bilaterally Surgical incisions clear Extremities pink Abdomen nondistended  Lab Results: Recent Labs    05/12/19 0232 05/12/19 0232 05/12/19 0330 05/13/19 0555  WBC 17.8*  --   --  16.3*  HGB 10.3*   < > 11.2* 10.4*  HCT 34.1*   < > 33.0* 33.9*  PLT 303  --   --  253   < > = values in this interval not displayed.   BMET:  Recent Labs    05/12/19 1600 05/13/19 0555  NA 133* 133*  K 4.7 4.1  CL 100 100  CO2 25 27  GLUCOSE 132* 239*  BUN 31* 32*  CREATININE 0.95 0.88  CALCIUM 7.8* 7.9*    PT/INR: No results for input(s): LABPROT, INR in the last 72 hours. ABG    Component Value Date/Time   PHART 7.303 (L) 05/12/2019 0330   HCO3  28.5 (H) 05/12/2019 0330   TCO2 30 05/12/2019 0330   ACIDBASEDEF 3.0 (H) 04/28/2019 0735   O2SAT 67.7 05/13/2019 0555   CBG (last 3)  Recent Labs    05/12/19 2012 05/13/19 0011 05/13/19 0526  GLUCAP 176* 137* 210*    Assessment/Plan: S/P Procedure(s) (LRB): VIDEO BRONCHOSCOPY USING DISPOSABLE ANESTHESIA SCOPE (N/A) TRACHEOSTOMY (N/A) Continue, nutrition, antibiotics and CVVH Lungs continue to worsen with dense infiltrates bilaterally,  Solu-Medrol 4 doses and bronchoscopy per CCM Leave epicardial pacing wires until off CVVH  LOS: 29 days    [name] 05/13/2019

## 2019-05-13 NOTE — Progress Notes (Signed)
Assisted Dr. Tamala Julian with bedside Bronchoscopy. Sample obtained and sent to lab. RT will continue to monitor.

## 2019-05-13 NOTE — Progress Notes (Signed)
[facility] Progress Note:   17M dialysis dependent AKI; cardiogenic shock status post CABG + MV replacement 4/2  S: Seen and examined on CRRT and procedure supervised.  Has had 3.5 liters UF over 4/26 with CRRT thus far per charting.  Goal has been 50-75 as tolerated.  AM labs are being drawn.  Levo is at 13 mcg/min and had been as low as 10 per nursing; on milrinone and vaso.    Review of systems:  Unable to obtain 2/2 sleepy, mech vent - just got pain meds   O: 04/26 0701 - 04/27 0700 In: 2460.8 [I.V.:465.8; NG/GT:1885; IV Piggyback:109.9] Out: 3458   Filed Weights   05/10/19 0415 05/11/19 0500 05/12/19 0415  Weight: 57.4 kg 57.9 kg 57.8 kg    Physical exam:    General adult male in bed critically ill  HEENT normocephalic atraumatic extraocular movements intact sclera anicteric Lungs coarse mechanical breath sounds   Heart S1S2 on pressors Abdomen soft nontender nondistended Extremities 1-2+ pedal edema  Neuro - sleepy s/p pain meds Psych no anxiety or agitation Access: left chest catheter - nontunneled    Recent Labs  Lab 05/11/19 1502 05/11/19 1502 05/12/19 0232 05/12/19 0330 05/12/19 1600  NA 135   < > 134* 135 133*  K 4.4   < > 4.1 4.5 4.7  CL 103  --  101  --  100  CO2 26  --  25  --  25  GLUCOSE 142*  --  199*  --  132*  BUN 33*  --  30*  --  31*  CREATININE 1.04  --  0.93  --  0.95  CALCIUM 7.7*  --  7.7*  --  7.8*  PHOS 3.0  --  2.9  --  2.8   < > = values in this interval not displayed.   Recent Labs  Lab 05/11/19 0328 05/11/19 0328 05/11/19 2325 05/12/19 0232 05/12/19 0330  WBC 15.7*  --  17.0* 17.8*  --   HGB 7.7*   < > 10.3* 10.3* 11.2*  HCT 26.2*   < > 33.3* 34.1* 33.0*  MCV 97.8  --  96.2 96.9  --   PLT 324  --  276 303  --    < > = values in this interval not displayed.    Scheduled Meds: . amiodarone  100 mg Per Tube Daily  . aspirin EC  325 mg Oral Daily   Or  . aspirin  324 mg Per Tube Daily  . B-complex with vitamin C  1  tablet Per Tube Daily  . chlorhexidine gluconate (MEDLINE KIT)  15 mL Mouth Rinse BID  . Chlorhexidine Gluconate Cloth  6 each Topical Daily  . docusate  200 mg Per Tube Daily  . feeding supplement (PRO-STAT SUGAR FREE 64)  60 mL Oral Q6H  . gabapentin  300 mg Per Tube Q8H  . insulin aspart  0-24 Units Subcutaneous Q4H  . insulin aspart  2 Units Subcutaneous Q4H  . insulin glargine  10 Units Subcutaneous BID  . mouth rinse  15 mL Mouth Rinse 10 times per day  . metoCLOPramide (REGLAN) injection  10 mg Intravenous Q8H  . midodrine  15 mg Per Tube TID WC  . pantoprazole sodium  40 mg Per Tube Daily  . pneumococcal 23 valent vaccine  0.5 mL Intramuscular Tomorrow-1000  . sodium chloride flush  10-40 mL Intracatheter Q12H   Continuous Infusions: .  prismasol BGK 4/2.5 400 mL/hr at 05/13/19  22  .  prismasol BGK 4/2.5 200 mL/hr at 05/12/19 1049  . sodium chloride 10 mL/hr at 05/06/19 0600  . epinephrine Stopped (04/28/19 0138)  . feeding supplement (VITAL 1.5 CAL) 1,000 mL (05/12/19 1753)  . fentaNYL infusion INTRAVENOUS Stopped (04/30/19 1211)  . heparin 10,000 units/ 20 mL infusion syringe 1,000 Units/hr (05/12/19 2029)  . lactated ringers Stopped (04/19/2019 1640)  . lactated ringers Stopped (04/30/19 0930)  . milrinone 0.125 mcg/kg/min (05/13/19 0500)  . norepinephrine (LEVOPHED) Adult infusion 13 mcg/min (05/13/19 0500)  . prismasol BGK 4/2.5 1,800 mL/hr (05/13/19 0459)  . vasopressin (PITRESSIN) infusion - *FOR SHOCK* 0.02 Units/min (05/13/19 0500)   PRN Meds:.Place/Maintain arterial line **AND** sodium chloride, alteplase, fentaNYL, fentaNYL (SUBLIMAZE) injection, Gerhardt's butt cream, heparin, levalbuterol, ondansetron (ZOFRAN) IV, ondansetron (ZOFRAN) IV, oxyCODONE, sodium chloride, sodium chloride flush  ABG    Component Value Date/Time   PHART 7.303 (L) 05/12/2019 0330   PCO2ART 57.7 (H) 05/12/2019 0330   PO2ART 67 (L) 05/12/2019 0330   HCO3 28.5 (H) 05/12/2019 0330    TCO2 30 05/12/2019 0330   ACIDBASEDEF 3.0 (H) 04/28/2019 0735   O2SAT 90.0 05/12/2019 0330    A/P  1. Dialysis dependent AKI, anuric.  CRRT started 4/10.  On all 4K baths.  UF goal net neg 50 to 75 an hour - backing down to the 50 an hour for now. Please page nephrology for any CRRT changes.  on fixed dose heparin in CRRT circuit.  Will follow-up labs 2. Hypophosphatemia, from CRRT, req intermittent repletion if < 2.5  3. Enterococcus faecalis pneumonia; per primary and CCM 4. 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 Cardiogenic shock on inotrope/pressors   6. CAD status post CABG 7. Mitral regurgitation status post mitral valve repair 8. diabetes mellitus 2 - per primary  9. Acute blood loss anemia - improved   10. Recurrent polymorphic ventricular tachycardia, on amiodarone    Claudia Desanctis 05/13/2019  5:45 AM

## 2019-05-13 NOTE — Progress Notes (Signed)
ABG results given to Dr. Prescott Gum. Verbal order received to proceed with putting pt to trach collar and vent QHS for rest. RT will continue to monitor. RN notified of orders as well.

## 2019-05-13 NOTE — Progress Notes (Signed)
Nutrition Follow-up  DOCUMENTATION CODES:   Severe malnutrition in context of chronic illness, Underweight  INTERVENTION:   Tube Feeding: Increase Vital 1.5 to 55 ml/hr Pro-Stat 30 mL TID Provides 2280 kcals, 134 g of protein and 1003 mL of free water Meets 100% of re-estimated calorie and protein needs  NUTRITION DIAGNOSIS:   Severe Malnutrition related to chronic illness as evidenced by severe muscle depletion, severe fat depletion.  Being addressed via TF   GOAL:   Patient will meet greater than or equal to 90% of their needs  Progressing  MONITOR:   Vent status, Diet advancement, Labs, Weight trends, TF tolerance  REASON FOR ASSESSMENT:   Consult Poor PO, Assessment of nutrition requirement/status  ASSESSMENT:   64 yo male admitted with acute systeolic CHF, CAD with 3 vessel disease with plan for CABG. PMH includes DM with HgbA1c 9 with neuropathy, HTN  3/30 Cardiac Cath with severe 3-vessel CAD 3/31 ECHO: EF 30-35% 4/01 IABP placed, Intubated 4/02 Impella placed, bilateral CT placed for pleural effusions 4/04 Extubated 4/06 CABG, MV repair, Intubated 4/07 Extubated 4/09 Impella removed, LVAD placed, Cortrak placed, TF initiated 4/10 CRRT initiated 4/12 Re-intubated 4/14 Extubated 4/15 Re-intubated 4/16 Trach, Bronchoscopy 4/27 Bronch  Pt alert, sitting up in chair position in bed. Smiling and waving.   Trach collar today, vent at night. Remains on CRRT UF 50-75 ml/hr Remains on vasopressin and levophed, on milrinone.  Nausea has resolved. Tolerating Vital 1.5 at 45 ml/hr with Pro-Stat via Cortrak tube  Current weight 57.8 kg; admit weight 68.2 kg. Net negative 9 L. Most of patient's weight loss can be accounted for via fluid losses; however some concern that pt has lost "true" weight. Plan to adjust TF today  Labs:  Phosphorus wdl, sodium wdl Meds: B-complex with C, ss novolog, lantus, novolog q 4 hours, reglan, solumedrol  Diet Order:   Diet  Order            Diet NPO time specified Except for: Ice Chips  Diet effective now              EDUCATION NEEDS:   Education needs have been addressed  Skin:  Skin Assessment: Skin Integrity Issues: Skin Integrity Issues:: Stage II Stage II: perineum Incisions: R axilla, chest, bilateral legs, neck  Last BM:  4/27 rectal tube  Height:   Ht Readings from Last 1 Encounters:  05/08/2019 5\' 11"  (1.803 m)    Weight:   Wt Readings from Last 1 Encounters:  05/12/19 57.8 kg     BMI:  Body mass index is 17.77 kg/m.  Estimated Nutritional Needs:   Kcal:  2100-2300 kcals  Protein:  120-135 g  Fluid:  >/= 1.8 L/day   Kerman Passey MS, RDN, LDN, CNSC RD Pager Number and RD On-Call Pager Number Located in [place]

## 2019-05-13 NOTE — Procedures (Signed)
Bronch repeated due to specimen spill on first collection. No complications.

## 2019-05-13 NOTE — Procedures (Signed)
Bronchoscopy  Indication: worsening x-ray, concern for amio toxicity vs opportunistic infection  Consent: signed in chart  Anesthesia: versed 2 mg, fentanyl 170mg  Procedure - Timeout performed - Bronchoscope advanced through tracheostomy - Airways examined down to subsegmental level - Following airway examination, BAL performed in RML with return of white cloudy material  Findings - Plaques on carina c/w candida - Trach in good position - Mild diffuse bronchitic changes - No endobronchial obstruction down to subsegmental level  Specimen(s): BAL RML  Complications: None immediate

## 2019-05-13 NOTE — Progress Notes (Signed)
[facility] BRIEF SICU PROGRESS NOTE  11 Days Post-Op  S/P Procedure(s) (LRB): VIDEO BRONCHOSCOPY USING DISPOSABLE ANESTHESIA SCOPE (N/A) TRACHEOSTOMY (N/A)   Stable day  Plan: Continue current plan  Rexene Alberts, MD 05/13/2019 6:38 PM

## 2019-05-14 ENCOUNTER — Inpatient Hospital Stay (HOSPITAL_COMMUNITY): Payer: Medicaid Other

## 2019-05-14 DIAGNOSIS — R7401 Elevation of levels of liver transaminase levels: Secondary | ICD-10-CM

## 2019-05-14 LAB — RENAL FUNCTION PANEL
Albumin: 2.4 g/dL — ABNORMAL LOW (ref 3.5–5.0)
Albumin: 2.1 g/dL — ABNORMAL LOW (ref 3.5–5.0)
Anion gap: 12 (ref 5–15)
Anion gap: 6 (ref 5–15)
BUN: 33 mg/dL — ABNORMAL HIGH (ref 8–23)
BUN: 35 mg/dL — ABNORMAL HIGH (ref 8–23)
CO2: 21 mmol/L — ABNORMAL LOW (ref 22–32)
CO2: 26 mmol/L (ref 22–32)
Calcium: 8.5 mg/dL — ABNORMAL LOW (ref 8.9–10.3)
Calcium: 8.2 mg/dL — ABNORMAL LOW (ref 8.9–10.3)
Chloride: 100 mmol/L (ref 98–111)
Chloride: 102 mmol/L (ref 98–111)
Creatinine, Ser: 0.92 mg/dL (ref 0.61–1.24)
Creatinine, Ser: 0.93 mg/dL (ref 0.61–1.24)
GFR calc Af Amer: >60 mL/min (ref >60)
GFR calc Af Amer: >60 mL/min (ref >60)
GFR calc non Af Amer: >60 mL/min (ref >60)
GFR calc non Af Amer: >60 mL/min (ref >60)
Glucose, Bld: 223 mg/dL — ABNORMAL HIGH (ref 70–99)
Glucose, Bld: 182 mg/dL — ABNORMAL HIGH (ref 70–99)
Phosphorus: 3.6 mg/dL (ref 2.5–4.6)
Phosphorus: 3.2 mg/dL (ref 2.5–4.6)
Potassium: 5.2 mmol/L — ABNORMAL HIGH (ref 3.5–5.1)
Potassium: 4.7 mmol/L (ref 3.5–5.1)
Sodium: 133 mmol/L — ABNORMAL LOW (ref 135–145)
Sodium: 134 mmol/L — ABNORMAL LOW (ref 135–145)

## 2019-05-14 LAB — CBC
HCT: 34.1 % — ABNORMAL LOW (ref 39.0–52.0)
Hemoglobin: 10.4 g/dL — ABNORMAL LOW (ref 13.0–17.0)
MCH: 30.1 pg (ref 26.0–34.0)
MCHC: 30.5 g/dL (ref 30.0–36.0)
MCV: 98.8 fL (ref 80.0–100.0)
Platelets: 241 10*3/uL (ref 150–400)
RBC: 3.45 MIL/uL — ABNORMAL LOW (ref 4.22–5.81)
RDW: 18.8 % — ABNORMAL HIGH (ref 11.5–15.5)
WBC: 11.7 10*3/uL — ABNORMAL HIGH (ref 4.0–10.5)
nRBC: 0 % (ref 0.0–0.2)

## 2019-05-14 LAB — HEPATIC FUNCTION PANEL
ALT: 54 U/L — ABNORMAL HIGH (ref 0–44)
AST: 34 U/L (ref 15–41)
Albumin: 2.3 g/dL — ABNORMAL LOW (ref 3.5–5.0)
Alkaline Phosphatase: 482 U/L — ABNORMAL HIGH (ref 38–126)
Bilirubin, Direct: 0.2 mg/dL (ref 0.0–0.2)
Indirect Bilirubin: 0.8 mg/dL (ref 0.3–0.9)
Total Bilirubin: 1 mg/dL (ref 0.3–1.2)
Total Protein: 7.2 g/dL (ref 6.5–8.1)

## 2019-05-14 LAB — POCT I-STAT 7, (LYTES, BLD GAS, ICA,H+H)
Acid-Base Excess: 1 mmol/L (ref 0.0–2.0)
Bicarbonate: 26.8 mmol/L (ref 20.0–28.0)
Calcium, Ion: 1.27 mmol/L (ref 1.15–1.40)
HCT: 34 % — ABNORMAL LOW (ref 39.0–52.0)
Hemoglobin: 11.6 g/dL — ABNORMAL LOW (ref 13.0–17.0)
O2 Saturation: 96 %
Patient temperature: 98.6
Potassium: 4.9 mmol/L (ref 3.5–5.1)
Sodium: 135 mmol/L (ref 135–145)
TCO2: 28 mmol/L (ref 22–32)
pCO2 arterial: 45 mmHg (ref 32.0–48.0)
pH, Arterial: 7.383 (ref 7.350–7.450)
pO2, Arterial: 85 mmHg (ref 83.0–108.0)

## 2019-05-14 LAB — BODY FLUID CELL COUNT WITH DIFFERENTIAL
Eos, Fluid: 0 %
Lymphs, Fluid: 5 %
Monocyte-Macrophage-Serous Fluid: 13 % — ABNORMAL LOW (ref 50–90)
Neutrophil Count, Fluid: 82 % — ABNORMAL HIGH (ref 0–25)
Total Nucleated Cell Count, Fluid: 730 cu mm (ref 0–1000)

## 2019-05-14 LAB — COOXEMETRY PANEL
Carboxyhemoglobin: 2 % — ABNORMAL HIGH (ref 0.5–1.5)
Methemoglobin: 1.3 % (ref 0.0–1.5)
O2 Saturation: 61.2 %
Total hemoglobin: 10.7 g/dL — ABNORMAL LOW (ref 12.0–16.0)

## 2019-05-14 LAB — ACID FAST SMEAR (AFB, MYCOBACTERIA): Acid Fast Smear: NEGATIVE

## 2019-05-14 LAB — GLUCOSE, CAPILLARY
Glucose-Capillary: 120 mg/dL — ABNORMAL HIGH (ref 70–99)
Glucose-Capillary: 136 mg/dL — ABNORMAL HIGH (ref 70–99)
Glucose-Capillary: 161 mg/dL — ABNORMAL HIGH (ref 70–99)
Glucose-Capillary: 189 mg/dL — ABNORMAL HIGH (ref 70–99)
Glucose-Capillary: 193 mg/dL — ABNORMAL HIGH (ref 70–99)
Glucose-Capillary: 203 mg/dL — ABNORMAL HIGH (ref 70–99)
Glucose-Capillary: 234 mg/dL — ABNORMAL HIGH (ref 70–99)

## 2019-05-14 LAB — MAGNESIUM: Magnesium: 2.7 mg/dL — ABNORMAL HIGH (ref 1.7–2.4)

## 2019-05-14 MED ORDER — FLUCONAZOLE 200 MG PO TABS
400.0000 mg | ORAL_TABLET | Freq: Once | ORAL | Status: AC
  Administered 2019-05-14: 400 mg
  Filled 2019-05-14: qty 2

## 2019-05-14 MED ORDER — FLUCONAZOLE 200 MG PO TABS
200.0000 mg | ORAL_TABLET | Freq: Every day | ORAL | Status: AC
  Administered 2019-05-15 – 2019-05-20 (×6): 200 mg
  Filled 2019-05-14 (×7): qty 1

## 2019-05-14 MED ORDER — INSULIN GLARGINE 100 UNIT/ML ~~LOC~~ SOLN
14.0000 [IU] | Freq: Two times a day (BID) | SUBCUTANEOUS | Status: DC
  Administered 2019-05-14 – 2019-05-18 (×9): 14 [IU] via SUBCUTANEOUS
  Filled 2019-05-14 (×11): qty 0.14

## 2019-05-14 NOTE — Progress Notes (Signed)
Total rectal tube output for the shift

## 2019-05-14 NOTE — Progress Notes (Signed)
Patient ID: Brendan [name], male   DOB: 08-Sep-1955, 64 y.o.   MRN: [id number]  [facility] Evening Rounds:  Hemodynamically stable on NE 1-2, vaso 0.02  CRRT removing 40-50/hr.   Has been on TC all day. Sats 95%.  BMET    Component Value Date/Time   NA 134 (L) 05/14/2019 1625   K 4.7 05/14/2019 1625   CL 102 05/14/2019 1625   CO2 26 05/14/2019 1625   GLUCOSE 182 (H) 05/14/2019 1625   BUN 35 (H) 05/14/2019 1625   CREATININE 0.93 05/14/2019 1625   CALCIUM 8.2 (L) 05/14/2019 1625   GFRNONAA >60 05/14/2019 1625   GFRAA >60 05/14/2019 1625

## 2019-05-14 NOTE — Progress Notes (Signed)
Physical Therapy Treatment Patient Details Name: Brendan [name] MRN: [id number] DOB: [birth year] Today's Date: 05/14/2019    History of Present Illness Pt presented with SOB and fatigue at [facility] found to have new HFrEF,  NSTEMI, and bilateral pleural effusions transferred to [facility] on 3/29 for further cardiac evaluation.  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  On the evening of 4/1, patient developed worsening respiratory distress and hypoxia, head R/L heart cath on 3/30 & R heart cath on 4/1 pt intubated on 4/1, extubated 4/4, had R & L chest tubes placed 4/2 due to bilateral pleural effusions, removed 4/5 and impella device placed 4/2.  CABG and MVR on 4/6. Extubated 4/7. Removal of impella 4/9. CVVH started 4/10. Respiratory distress and reintubated 4/12. Extubated 4/14. Reintubated 4/15. Trach 4/16.  PMHx of Tobacco abuse, HTN, poorly controlled DM, diabetic neuropathy    PT Comments    Patient progressing as able to tolerate some activity today while on trach collar.  He stood best second trial today with more trunk extension than he's had.  Eager for LE therex as fatigues quickly with standing attempting to communicate he preferred in bed leg exercises.  Patient will continue to benefit from skilled PT in the acute setting.  Feel as he is still needing vent at night, he may be appropriate for LTACH prior to CIR.  PT to follow.   Follow Up Recommendations  LTACH     Equipment Recommendations  Other (comment)(TBA)    Recommendations for Other Services       Precautions / Restrictions Precautions Precautions: Fall;Sternal Precaution Comments: CRRT, flexiseal, 35% trach collar    Mobility  Bed Mobility Overal bed mobility: Needs Assistance       Supine to sit: Min assist;HOB elevated;+2 for physical assistance     General bed mobility comments: used bed egress mode while on CRRT for scooting out to EOB using rails on the bed  Transfers Overall transfer level: Needs  assistance Equipment used: 2 person hand held assist Transfers: Sit to/from Stand Sit to Stand: Max assist;+2 safety/equipment;From elevated surface         General transfer comment: sit<>stand x 2 lifting assist with knees blocked, improved upright standing second trial  Ambulation/Gait             General Gait Details: unable   Stairs             Wheelchair Mobility    Modified Rankin (Stroke Patients Only)       Balance Overall balance assessment: Needs assistance Sitting-balance support: Bilateral upper extremity supported Sitting balance-Leahy Scale: Poor Sitting balance - Comments: UE support for sitting EOB prior to sit to stand or leaning back on bed   Standing balance support: Bilateral upper extremity supported Standing balance-Leahy Scale: Poor                              Cognition Arousal/Alertness: Awake/alert Behavior During Therapy: WFL for tasks assessed/performed Overall Cognitive Status: Difficult to assess                                 General Comments: follows commands well, attempting to communicate mouthing words      Exercises General Exercises - Lower Extremity Ankle Circles/Pumps: AROM;10 reps;Supine Long Arc Quad: AROM;Seated;10 reps;15 reps(15 on R) Heel Slides: Strengthening;10 reps;Supine(resistance to extension) Hip ABduction/ADduction: AROM;10 reps;Supine  General Comments General comments (skin integrity, edema, etc.): VSS on trach collar 35% FiO2, BP 86/52 (improved from supine)      Pertinent Vitals/Pain Pain Assessment: Faces Faces Pain Scale: Hurts little more Pain Location: gneralized with mobility Pain Descriptors / Indicators: Grimacing Pain Intervention(s): Monitored during session;Repositioned    Home Living                      Prior Function            PT Goals (current goals can now be found in the care plan section) Progress towards PT goals: Progressing  toward goals    Frequency    Min 3X/week      PT Plan Discharge plan needs to be updated    Co-evaluation              AM-PAC PT "6 Clicks" Mobility   Outcome Measure  Help needed turning from your back to your side while in a flat bed without using bedrails?: A Lot Help needed moving from lying on your back to sitting on the side of a flat bed without using bedrails?: A Lot Help needed moving to and from a bed to a chair (including a wheelchair)?: Total Help needed standing up from a chair using your arms (e.g., wheelchair or bedside chair)?: Total Help needed to walk in hospital room?: Total Help needed climbing 3-5 steps with a railing? : Total 6 Click Score: 8    End of Session Equipment Utilized During Treatment: Gait belt;Oxygen Activity Tolerance: Patient limited by fatigue Patient left: in bed;with call bell/phone within reach;with bed alarm set(bed in chair position RN in the room)   PT Visit Diagnosis: Other abnormalities of gait and mobility (R26.89);Muscle weakness (generalized) (M62.81)     Time: 5456-2563 PT Time Calculation (min) (ACUTE ONLY): 27 min  Charges:  $Therapeutic Exercise: 8-22 mins $Therapeutic Activity: 8-22 mins                     Magda Kiel, [place] [facility] [phone] 05/14/2019    [name] 05/14/2019, 4:32 PM

## 2019-05-14 NOTE — Progress Notes (Signed)
Placed back on vent for rest tonight

## 2019-05-14 NOTE — Progress Notes (Addendum)
[facility] DAILY ICU PROGRESS NOTE                   [address]            [place] [address]          [phone]   12 Days Post-Op Procedure(s) (LRB): VIDEO BRONCHOSCOPY USING DISPOSABLE ANESTHESIA SCOPE (N/A) TRACHEOSTOMY (N/A)  Total Length of Stay:  LOS: 30 days   Subjective: Pt awake and alert this am and on CVVHD this am. Nurses at bedside changing pad and placing new dressing to prevent decub  Objective: Vital signs in last 24 hours: Temp:  [97.4 F (36.3 C)-97.6 F (36.4 C)] 97.6 F (36.4 C) (04/28 0700) Pulse Rate:  [25-121] 62 (04/28 1000) Cardiac Rhythm: Normal sinus rhythm (04/28 0730) Resp:  [7-22] 9 (04/28 1000) BP: (82-121)/(43-73) 95/51 (04/28 1000) SpO2:  [53 %-100 %] 98 % (04/28 1000) FiO2 (%):  [35 %-40 %] 40 % (04/28 0730) Weight:  [53.4 kg] 53.4 kg (04/28 0500)  Filed Weights   05/11/19 0500 05/12/19 0415 05/14/19 0500  Weight: 57.9 kg 57.8 kg 53.4 kg    Weight change:    Hemodynamic parameters for last 24 hours: CVP:  [2 mmHg-8 mmHg] 2 mmHg  Intake/Output from previous day: 04/27 0701 - 04/28 0700 In: 2401.5 [I.V.:340.6; HQ/PR:9163.8; IV Piggyback:76.4] Out: 6699 [Stool:725]  Intake/Output this shift: Total I/O In: 193.1 [I.V.:28.1; NG/GT:165] Out: 331 [Other:331]  Current Meds: Scheduled Meds: . aspirin EC  325 mg Oral Daily   Or  . aspirin  324 mg Per Tube Daily  . B-complex with vitamin C  1 tablet Per Tube Daily  . chlorhexidine gluconate (MEDLINE KIT)  15 mL Mouth Rinse BID  . Chlorhexidine Gluconate Cloth  6 each Topical Daily  . docusate  200 mg Per Tube Daily  . feeding supplement (PRO-STAT SUGAR FREE 64)  30 mL Oral TID  . gabapentin  300 mg Per Tube Q8H  . insulin aspart  0-24 Units Subcutaneous Q4H  . insulin aspart  2 Units Subcutaneous Q4H  . insulin glargine  10 Units Subcutaneous BID  . mouth rinse  15 mL Mouth Rinse 10 times per day  . methylPREDNISolone (SOLU-MEDROL) injection  80 mg Intravenous BID   . metoCLOPramide (REGLAN) injection  10 mg Intravenous Q8H  . midodrine  15 mg Per Tube Q8H  . neomycin-polymyxin-hydrocortisone  3 drop Left EAR Q12H  . oxyCODONE  10 mg Per Tube Q6H  . pantoprazole sodium  40 mg Per Tube BID  . pneumococcal 23 valent vaccine  0.5 mL Intramuscular Tomorrow-1000  . sodium chloride flush  10-40 mL Intracatheter Q12H   Continuous Infusions: .  prismasol BGK 4/2.5 400 mL/hr at 05/14/19 0616  .  prismasol BGK 4/2.5 200 mL/hr at 05/13/19 1210  . sodium chloride 10 mL/hr at 05/06/19 0600  . epinephrine Stopped (04/28/19 0138)  . feeding supplement (VITAL 1.5 CAL) 55 mL/hr at 05/14/19 1000  . heparin 10,000 units/ 20 mL infusion syringe 1,000 Units/hr (05/14/19 0138)  . lactated ringers Stopped (05/07/2019 1640)  . lactated ringers Stopped (04/30/19 0930)  . norepinephrine (LEVOPHED) Adult infusion 2 mcg/min (05/14/19 1000)  . prismasol BGK 4/2.5 1,800 mL/hr at 05/14/19 0848  . vasopressin (PITRESSIN) infusion - *FOR SHOCK* 0.02 Units/min (05/14/19 1000)   PRN Meds:.Place/Maintain arterial line **AND** sodium chloride, alteplase, Gerhardt's butt cream, heparin, levalbuterol, ondansetron (ZOFRAN) IV, ondansetron (ZOFRAN) IV, oxyCODONE, sodium chloride, sodium chloride flush  Heart: RRR Lungs:  Mostly clear Abdomen: Soft, non tender,  bowel sounds present Extremities: Mild LE edema ( mid shin downward) and feet have are edematous Wound: Sternal dressing is clean and dry;right axillary wound is clean and dry. Of note, the most inferior portion of sternal wound has superficial dehiscence with some sero sanguinous drainage but no sign of infection. He also has had some bloody like drainage from left chest tube wound.  Lab Results: CBC: Recent Labs    05/13/19 0555 05/13/19 1029 05/14/19 0325 05/14/19 0505  WBC 16.3*  --   --  11.7*  HGB 10.4*   < > 11.6* 10.4*  HCT 33.9*   < > 34.0* 34.1*  PLT 253  --   --  241   < > = values in this interval not  displayed.   BMET:  Recent Labs    05/13/19 1600 05/13/19 1600 05/14/19 0325 05/14/19 0505  NA 133*   < > 135 133*  K 4.7   < > 4.9 5.2*  CL 100  --   --  100  CO2 25  --   --  21*  GLUCOSE 171*  --   --  223*  BUN 32*  --   --  33*  CREATININE 0.87  --   --  0.92  CALCIUM 8.3*  --   --  8.5*   < > = values in this interval not displayed.    CMET: Lab Results  Component Value Date   WBC 11.7 (H) 05/14/2019   HGB 10.4 (L) 05/14/2019   HCT 34.1 (L) 05/14/2019   PLT 241 05/14/2019   GLUCOSE 223 (H) 05/14/2019   CHOL 174 08/21/2017   TRIG 143 08/21/2017   HDL 57 08/21/2017   LDLCALC 88 08/21/2017   ALT 54 (H) 05/14/2019   AST 34 05/14/2019   NA 133 (L) 05/14/2019   K 5.2 (H) 05/14/2019   CL 100 05/14/2019   CREATININE 0.92 05/14/2019   BUN 33 (H) 05/14/2019   CO2 21 (L) 05/14/2019   TSH 0.925 03/25/2019   INR 1.6 (H) 05/01/2019   HGBA1C 9.0 (H) 04/16/2019   MICROALBUR 30 08/21/2017    PT/INR:  No results for input(s): LABPROT, INR in the last 72 hours. Radiology: [facility] Chest Port 1 View  Result Date: 05/14/2019 CLINICAL DATA:  Postop from CABG. EXAM: PORTABLE CHEST 1 VIEW COMPARISON:  05/13/2019 FINDINGS: Support lines and tubes in appropriate position. No pneumothorax visualized. Bilateral diffuse pulmonary airspace disease shows interval improvement since previous study. Heart size is stable. IMPRESSION: Decreased bilateral diffuse pulmonary airspace disease since prior exam. Electronically Signed   By: Marlaine Hind M.D.   On: 05/14/2019 09:16    Assessment/Plan: S/P Procedure(s) (LRB): VIDEO BRONCHOSCOPY USING DISPOSABLE ANESTHESIA SCOPE (N/A) TRACHEOSTOMY (N/A)  1. CV-S/p removal of Impella on 04/09.  S/p V tach arrest 04/15. Previous a fib. SR with HR in the 70's this am.  He was paced yesterday (to help with CO but unable to pace after 9 pm-? If wires detached so will discuss if able to remove EPW with surgeon). On  Midodrine 15 mg tid. Also, on Nor epinephrine  drip at 2 mcg/min and Vasopressin .02 units/min drips. Weaning drips as able.  Co ox this am decreased to 61.2 (off Milrinone) 2. Pulmonary-S/p trach 04/16. ABG results this am noted.On trach collar during the day and ventilator at night but has been requiring more ventilator last 24 hours secondary to worsening hypoxia. FiO2% 40. CXR this am  appears stable (diffuse bilateral pulmonary infiltrates/edema but no pneumothorax.) 3. Expected post op blood loss anemia-H and H this am decreased to 8.1 and 27.6 4. DM-CBGs 234/189/161. On Insulin. He is on Insulin and this was increased for better glucose control. He was on Metformin 1000 mg bid prior to surgery, but will continue on Insulin for now as NPO. Pre op HGA1C 9. He will need close medical follow up after discharge 5. AKI-Anuric. Creatinine 0.92 this am. Nephrology following and arranging for CVVHD accordingly 6. GI-severe malnutrition of chronic illness.  Cortrak, TFs. Speech pathology evaluation done yesterday and recommendations to be followed accordingly. Albumin increased to 2.4 7. Elevated transaminases (likely related to shock)-AST 34, ALT 54, ALk phos 482 and all are decreasing.  T bili decreased to 1 this am 8. ID-Leukocytosis decreased to WBC to 11,700 (likely related to aspiration). Previous yeast in sputum and was on Zyvox and Zosyn for broader coverage. Of note, he completed Fluconazole for Candida in urine. Completed Vanco and Cefepime for Enterococcus Faecalis. ESR was elevated so has been given Solu Medrol, which may increase WBC 9. Acute systolic heart failure-CVVHD helping with volume status 10. Extremely deconditioned-will need PT as able  Nani Skillern PA-C 05/14/2019 10:25 AM     Chart reviewed, patient examined, agree with above. It is a fine line keeping him as dry as possible without having to increase vasopressor requirement.

## 2019-05-14 NOTE — Progress Notes (Addendum)
Patient ID: Brendan [name], male   DOB: 1955-12-09, 64 y.o.   MRN: [id number]     Advanced Heart Failure Rounding Note  PCP-Cardiologist: No primary care provider on file.   Subjective:    Events - Presented to [facility] with acute HF  EF 20-25% - [facility]red [facility] - Cath 3/30 with severe 3 vessel disease. EF 25% - Developed PMVT on milrinone -> milrinone stopped -> developed worsening shock -> IABP placed on 4/1 - Clinical deterioration 4/1-> intubated - 4/2 underwent Impella 5.5 placement earlier and bilateral chest tubes with 3L out from each side.  - Extubated 4/4 - 05/12/2019 CABG and MVR - Extubated 4/7 - Back to OR for Impella 5.5 extraction 4/9, extubated.  TEE with EF 35%, RV ok, trivial MR s/p MV repair.  - CVVH started 4/10 with oliguria, rising creatinine and CVP.  - Deteriorating respiratory status, intubated again early am 4/12.  Enterococcus faecalis in sputum.  - Extubated 4/14.  Developed respiratory distress and started on Bipap.  Had VT arrest with CPR/epinephrine, intubated again 4/15 early am.  - Tracheostomy 4/16 - Atrial fibrillation early am 4/17 -Echo 4/22: EF 30-35% RV mildly reduced. MVR looks good. - S/P Bronchoscopy 4/27    Have been running CVVHD at -100. Remains intubated. FiO2 40%  Remains on NE 2 mcg and  VP 0.02.   Drowsy this morning. Denies pain.   Objective:   Weight Range: 53.4 kg Body mass index is 16.42 kg/m.   Vital Signs:   Temp:  [97.4 F (36.3 C)] 97.4 F (36.3 C) (04/27 2000) Pulse Rate:  [25-124] 65 (04/28 0600) Resp:  [8-22] 12 (04/28 0600) BP: (82-121)/(43-73) 95/57 (04/28 0600) SpO2:  [53 %-100 %] 95 % (04/28 0600) FiO2 (%):  [35 %-40 %] 40 % (04/28 0323) Weight:  [53.4 kg] 53.4 kg (04/28 0500) Last BM Date: 05/13/19  Weight change: Filed Weights   05/11/19 0500 05/12/19 0415 05/14/19 0500  Weight: 57.9 kg 57.8 kg 53.4 kg    Intake/Output:   Intake/Output Summary (Last 24 hours) at 05/14/2019 0709 Last data  filed at 05/14/2019 0600 Gross per 24 hour  Intake 2336.85 ml  Output 6563 ml  Net -4226.15 ml      Physical Exam  CVP ~6   General: Thin. Trach/Vent L subclavian HD cath HEENT: normal Neck: Trach. Supple. no JVD. Carotids 2+ bilat; no bruits. No lymphadenopathy or thryomegaly appreciated. Cor: PMI nondisplaced. Regular rate & rhythm. No rubs, gallops or murmurs.  Lungs: clear Abdomen: soft, nontender, nondistended. No hepatosplenomegaly. No bruits or masses. Good bowel sounds. Extremities: no cyanosis, clubbing, rash, edema. RUE PICC  Neuro: alert , on vent, follows commands. Moves all  4 extremities w/o difficulty.    Telemetry   SR 60-70s personally reviewed.    Labs    CBC Recent Labs    05/13/19 0555 05/13/19 1029 05/14/19 0325 05/14/19 0505  WBC 16.3*  --   --  11.7*  HGB 10.4*   < > 11.6* 10.4*  HCT 33.9*   < > 34.0* 34.1*  MCV 97.7  --   --  98.8  PLT 253  --   --  241   < > = values in this interval not displayed.   Basic Metabolic Panel Recent Labs    05/13/19 0555 05/13/19 1029 05/13/19 1600 05/13/19 1600 05/14/19 0325 05/14/19 0505  NA 133*   < > 133*   < > 135 133*  K 4.1   < >  4.7   < > 4.9 5.2*  CL 100  --  100  --   --  100  CO2 27  --  25  --   --  21*  GLUCOSE 239*  --  171*  --   --  223*  BUN 32*  --  32*  --   --  33*  CREATININE 0.88  --  0.87  --   --  0.92  CALCIUM 7.9*  --  8.3*  --   --  8.5*  MG 2.5*  --   --   --   --  2.7*  PHOS 2.8  --  3.3  --   --  3.6   < > = values in this interval not displayed.   Liver Function Tests Recent Labs    05/13/19 0555 05/13/19 0555 05/13/19 1600 05/14/19 0505  AST 53*  --   --  34  ALT 64*  --   --  54*  ALKPHOS 525*  --   --  482*  BILITOT 0.8  --   --  1.0  PROT 6.6  --   --  7.2  ALBUMIN 1.7*  1.7*   < > 2.6* 2.3*  2.4*   < > = values in this interval not displayed.   No results for input(s): LIPASE, AMYLASE in the last 72 hours. Cardiac Enzymes No results for input(s):  CKTOTAL, CKMB, CKMBINDEX, TROPONINI in the last 72 hours.  BNP: BNP (last 3 results) Recent Labs    03/26/2019 0113  BNP 655.7*    ProBNP (last 3 results) No results for input(s): PROBNP in the last 8760 hours.   D-Dimer No results for input(s): DDIMER in the last 72 hours. Hemoglobin A1C No results for input(s): HGBA1C in the last 72 hours. Fasting Lipid Panel No results for input(s): CHOL, HDL, LDLCALC, TRIG, CHOLHDL, LDLDIRECT in the last 72 hours. Thyroid Function Tests No results for input(s): TSH, T4TOTAL, T3FREE, THYROIDAB in the last 72 hours.  Invalid input(s): FREET3  Other results:   Imaging    No results found.   Medications:     Scheduled Medications: . aspirin EC  325 mg Oral Daily   Or  . aspirin  324 mg Per Tube Daily  . B-complex with vitamin C  1 tablet Per Tube Daily  . chlorhexidine gluconate (MEDLINE KIT)  15 mL Mouth Rinse BID  . Chlorhexidine Gluconate Cloth  6 each Topical Daily  . docusate  200 mg Per Tube Daily  . feeding supplement (PRO-STAT SUGAR FREE 64)  30 mL Oral TID  . gabapentin  300 mg Per Tube Q8H  . insulin aspart  0-24 Units Subcutaneous Q4H  . insulin aspart  2 Units Subcutaneous Q4H  . insulin glargine  10 Units Subcutaneous BID  . mouth rinse  15 mL Mouth Rinse 10 times per day  . methylPREDNISolone (SOLU-MEDROL) injection  80 mg Intravenous BID  . metoCLOPramide (REGLAN) injection  10 mg Intravenous Q8H  . midodrine  15 mg Per Tube Q8H  . neomycin-polymyxin-hydrocortisone  3 drop Left EAR Q12H  . oxyCODONE  10 mg Per Tube Q6H  . pantoprazole sodium  40 mg Per Tube BID  . pneumococcal 23 valent vaccine  0.5 mL Intramuscular Tomorrow-1000  . sodium chloride flush  10-40 mL Intracatheter Q12H    Infusions: .  prismasol BGK 4/2.5 400 mL/hr at 05/14/19 0616  .  prismasol BGK 4/2.5 200 mL/hr at 05/13/19 1210  .  sodium chloride 10 mL/hr at 05/06/19 0600  . epinephrine Stopped (04/28/19 0138)  . feeding supplement  (VITAL 1.5 CAL) 1,000 mL (05/13/19 1811)  . heparin 10,000 units/ 20 mL infusion syringe 1,000 Units/hr (05/14/19 0138)  . lactated ringers Stopped (05/14/2019 1640)  . lactated ringers Stopped (04/30/19 0930)  . norepinephrine (LEVOPHED) Adult infusion 3 mcg/min (05/14/19 0600)  . prismasol BGK 4/2.5 1,800 mL/hr at 05/14/19 0615  . vasopressin (PITRESSIN) infusion - *FOR SHOCK* 0.02 Units/min (05/14/19 0600)    PRN Medications: Place/Maintain arterial line **AND** sodium chloride, alteplase, Gerhardt's butt cream, heparin, levalbuterol, ondansetron (ZOFRAN) IV, ondansetron (ZOFRAN) IV, oxyCODONE, sodium chloride, sodium chloride flush     Assessment/Plan    1. Acute systolic HF ->  Cardiogenic Shock  - Due to iCM. EF 20-25% - Impella 5.5 placed on 4/2.   - s/p CABG/MVRepair on 4/6  - Impella out 4/9.  - Echo 4/10 with EF 25-30%, normal RV, no MR.  - Echo 4/22 EF 30-35% mildly reduced RV MVR ok   - Remains on CVVH.   - CO-OX 61%.  - Remains on norepinephrine 2 mcg +vaso 0.02 units. Maybe able to stop vasopressin.  -  On midodrine 15 tid.  -Continue OxyContin 10 mg twice a day.   2. CAD - LHC with severe 3 vessel CAD  - No s/s angina - s/p CABG x 3 MV Repair 4/6. Echo stable - on ASA/Crestor  3. Acute hypoxic respiratory failure - Due to pulmonary edema and PNA - Extubated 4/9 re-intubated on 4/12. Failed re-extubation on 4/14. Now s/p tracheostomy -Completed antibiotic course.  - S/P Bronch 4/27 - cultures pending  - Remains vented through trach.  - ESR 55 - May need LTAC  4. Mitral Regurgitation - Mod-severe on ECHO - s/p MV repair, TEE 4/9 with minimal MR s/p repair.   - Echo 4/10 with no significant MR.  - echo 4/22 MVR stable  5. Uncontrolled DM - Hgb A1C 9.  - On insulin and sliding scale.  - No change  6. AKI - due to shock - Became anuric and now on CVVH to control volume overload.   - Remains anuric.  - Nephrology following.   7. Acute blood loss  Anemia  - Hgb stable today  -  No obvious bleeding.  - transfuse hgb < 7.5   8. Polymorphic VT - Recurrent VT during respiratory distress 4/14, - Off amio  - No further VT.  - Keep K> 4.0 Mg > 2.0  - no change  9. 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 No sensation R and L foot and occasionaly in his hands. Requires assistance with ADLs.   10. Severe Malnutrition - Prealbumin 8.9  - Nutrition on board - Continue w/ TFs  11. Elevated LFTs - Suspect shock liver - AST/ALT remain stably elevated.  12. Atrial fibrillation. paroxysmal -  Remains in NSR.  - Amiodarone was stopped.   2. Debility, severe - will need SNF on d/c  14. Chronic pain issues -4/27  Pain was not controlled. Using IV fentanyl  ~14 x a day. Switched to oxycontin 10 mg q 6 hours.     Darrick Grinder, NP  7:09 AM  Agree with above.   Remains on vent through TC this am.  CVVHD at -100 overnight. Weight down 10 pounds. Bronch yesterday with mild candida plaques on the carina. Otherwise ok  GS with yeast. Also getting steroids.  CXR improved. NE down to 2. On VP  0.02. Off milrinone. Continues to ask for pain meds. CVP 6  General:  Cachectic weak appearing. On vent through trach HEENT: normal Neck: supple. no JVD.  + trach Carotids 2+ bilat; no bruits. No lymphadenopathy or thryomegaly appreciated. Cor: PMI nondisplaced. Regular rate & rhythm. No rubs, gallops or murmurs. L subclav renal line Lungs: coarse Abdomen: soft, nontender, nondistended. No hepatosplenomegaly. No bruits or masses. Good bowel sounds. Extremities: no cyanosis, clubbing, rash, edema Neuro: alert & orientedx3, cranial nerves grossly intact. moves all 4 extremities w/o difficulty. Affect pleasant  CXR much improved today with steroids and pulling more fluid. Vasopressor requirement also down. Will continue to keep negative with CVVHD today. Continue midodrine. Wean pressors as tolerated. Will hold off on treating candida at this  time as likely colonization.  CRITICAL CARE Performed by: Glori Bickers  Total critical care time: 45 minutes  Critical care time was exclusive of separately billable procedures and treating other patients.  Critical care was necessary to treat or prevent imminent or life-threatening deterioration.  Critical care was time spent personally by me (independent of midlevel providers or residents) on the following activities: development of treatment plan with patient and/or surrogate as well as nursing, discussions with consultants, evaluation of patient's response to treatment, examination of patient, obtaining history from patient or surrogate, ordering and performing treatments and interventions, ordering and review of laboratory studies, ordering and review of radiographic studies, pulse oximetry and re-evaluation of patient's condition.  Glori Bickers, MD  8:41 AM

## 2019-05-14 NOTE — Progress Notes (Signed)
NAME:  Brendan [name], MRN:  [id number], DOB:  20-Jul-1955, LOS: 24 ADMISSION DATE:  04/06/2019, CONSULTATION DATE:  04/17/19 REFERRING MD:  Dr. Haroldine Laws, CHIEF COMPLAINT:  Respiratory distress   Brief History   89 yoM originally presented with SOB and fatigue at [facility] found to have new HFrEF, NSTEMI, and bilateral pleural effusions transferred to [facility] on 3/29 for further cardiac evaluation. 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 Started on lasix and milrinone gtts however developed NSVT. Was taken 4/1 for placement of IABP and swan for optimization prior to CABG. Was on precedex for agitation/ confusion, some concern for DTs. On the evening of 4/1, patient developed worsening respiratory distress and hypoxia, PCCM consulted for intubation and vent management.   He was successfully extubated on 4/4 and PCCM signed off. Impella was removed 4/11 and patient noted to have increased WOB throughout the day he was placed on Bipap, but mental status continued to worsen and PCCM consulted for possible re-intubation  History of present illness   Mr. Brendan [name] is a 64 yo male withpoorly controlled diabetes complicated by neuropathy and hypertension who presented to an outside hospital on 3/26 with a chief complaint of general malaise and shortness of breath for the past 4 days. He is now transferred to [facility] for evaluation of troponin elevation, evaluation of newly depressed EF,and consideration for coronary angiography.  History obtained from patient report as well as discharge paperwork from outside hospital. Briefly, patient reportedly had nausea, vomiting, diarrhea approximately 1 week and is confined to his bed. He finally presented to the outside hospital ED he was found to have a blood sugar of 516, BUN of 53, creatinine 1.2, and anion gap of 17. Patient's BNP was elevated to 8900. Troponin was elevated to 13.1 with changes noted by the team of their lateral leads as well as V1 and V2.  Patient was started on aspirin and a heparin drip. Chest x-ray on admission was concerning for bilateral pulmonary opacities differential including infection and pulmonary edema. So he was started on empiric ceftriaxone and doxycycline, although his procalcitonin was negative. He had a chest wall echo that was performed that showed an EF of 25% was diuresed during his admission with improvement in his shortness of breath as well as radiographically. On discussion with patient he has been chest pain-free since his admission totheoutside hospital. In addition, he feels that his shortness of breath is much improved.the  Patient had left heart cath on 03/17/2019 showing severe three-vessel disease. 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 He then developed worsening cardiogenic shock and was intubated on 4/1 andplaced on a IABP on 4/02. He also had bilateral chest tubes placed with3 L removed from each side. He was then extubated on 4/4. On 4/6 patient had CABG/MVRperformed and was extubated on 4/7 with improved mentation with weaning of sedation and pressors. Patient had Impella removed on 4/9 and subsequent TEE showing an EF of 35%with trivial MR s/p MV repair. Remained on BiPAP at this time.On 4/10 nephrology was consulted due to anuric sinceCABG and started on CRRT.Developing worsening shortness of breath and increased work of breathing on BiPAP with hypoxia on ABG and subsequently reintubated on 4/12.  Patient was extubated on 4/14 and developed worsening hypoxia on BiPAP. Overnight he developed pulseless VT requiring 1 minute of CPR and epinephrine to achieve ROSC. Neurovascularly intact afterward. Subsequently had increased work of breathing and worsening respiratory distress requiring intubation.  Patient had tracheostomy performed on  05/16/2019. Trying to wean off ventilator.   Past Medical History  Tobacco abuse, HTN, poorly  controlled diabetes, diabetic neuropathy  Significant Hospital Events   3/30 Lt heart cath/TEE performed 4/1 Intubated, RHC/IABP/PA cath 4/2 Impella placement 4/3 febrile over evening and night. Blood cultures obtained. Vanc/cefepime started. Hematuria--heparin held. 4/4 Extubated,abx transitioned to rocephin. Heparin restarted 4/6 Re-intubated for CABG/MVR 4/7 Extubated from CABG to BiPAP 4/9 Impella removed  4/9 TEE 4/10 CVC inserted &CRRT initiated  4/12 PCCMre-consulted re-intubation 4/14 Extubated to BiPAP 4/15 Re-intubated due to respiratory distress  Consults:  TCTS PCCM HF  Procedures:  4/15ET tube - 4/15 4/9 Cortrak> 4/6 Rt IJ> 4/10 HD cath- 4/13 4/12 foley cath> 3/30 PICC> 4/13 HD cath> 4/16 Tracheostomy>  Significant Diagnostic Tests:  3/30 R/ LHC >>  Ost LM to Mid LM lesion is 35% stenosed.  Prox LAD lesion is 90% stenosed.  Prox LAD to Mid LAD lesion is 50% stenosed.  Mid Cx lesion is 100% stenosed.  Prox RCA to Mid RCA lesion is 90% stenosed.  RPDA lesion is 95% stenosed.  LV end diastolic pressure is severely elevated.  Hemodynamic findings consistent with moderate pulmonary hypertension. 1. Severe 3 vessel obstructive CAD 2. High LV filling pressures 3. Reduced cardiac output with index 2.3 4. Moderate pulmonary HTN.  3/30TTE >> 1. Left ventricular ejection fraction, by estimation, is 30 to 35%. The left ventricle has moderately decreased function. The left ventricle demonstrates regional wall motion abnormalities.There is mild left ventricular hypertrophy. Left ventricular diastolic function could not be evaluated. There is severe hypokinesis of the left ventricular, entire inferior wall, inferoseptal wall, apical segment and lateral wall.  2. Right ventricular systolic function is hyperdynamic. The right ventricular size is normal. There is moderately elevated pulmonary artery systolic pressure.  3. Decreased posterior leaflet  motion due to ischemic tethering of the mitral valve leaflets.  4. The mitral valve is abnormal. Moderate to severe mitral valve regurgitation.  5. The aortic valve is tricuspid. Aortic valve regurgitation is not visualized. Mild aortic valve sclerosis is present, with no evidence of aortic valve stenosis.  6. The inferior vena cava is normal in size with greater than 50% respiratory variability, suggesting right atrial pressure of 3 mmHg.   4/1 CT chest w/o >>Large bilateral pleural effusions with associated atelectasis.Moderate to severe bilateral ground-glass opacities, likely reflecting pneumonia and/or edema.  4/1 RHC >> RA = 18 RV = 60/20  PA = 63/29 (42) PCW = 33 Fick cardiac output/index = 3.7/1.9 PVR = 2.5 WU FA sat = 98% PA sat = 47%, 49% PaPi = 2.2   Micro Data:  3/30 MRSA PCR >> neg 4/1 MRSA PCR >> neg 4/2 resp culture>>rare gram pos cocci, few candida albicans 4/3 Urine culture>>negative 4/4 blood cultures>>no growth  4/9 Surgical wound>> rare enterococcus faecalis 4/9 BCx2>>no gorwth 4/9 Urine Culture>> greater than 100k yeast 4/12 Resp >. Negative 4/16 BAL >> negative (AFB, fungal negative) 4/20 Resp >> rare yeast >>  4/20 blood >>  4/21 Resp >> rare yeast >>  4/27 BAL fungus> yeast 4/27 BAL resp>> 4/27 BAL AFB>> 4/27 PJP smear>>  Antimicrobials:  PTA ceftriaxone/ doxycycline >> 3/29 Ceftriaxone 4/4-4/5 Cefepime 4/4>>4/11 vanc 4/4>>4/6 Rocephin 4/4>>4/5 Cefuroxime 4/6-4/7 Ampicillin 4/11 Vancomycin 4/12>>4/13 Cefepime 4/12>>4/13 Augmentin / unasyn 4/15> 4/20 Zosyn 4/20 >>  linezolid 4/20 >>    Interim history/subjective:  BAL yesterday. Tolerated more volume off with CRRT, neg 4.3L over past 24 hrs and -10#. Decreasing vasopressors.  Objective   Blood  pressure (!) 95/51, pulse 62, temperature 97.6 F (36.4 C), temperature source Oral, resp. rate (!) 9, height _0  (1.803 m), weight 53.4 kg, SpO2 98 %. CVP:  [2 mmHg-8 mmHg] 2 mmHg    Vent Mode: PSV;CPAP FiO2 (%):  [35 %-40 %] 40 % PEEP:  [5 cmH20] 5 cmH20 Pressure Support:  [8 cmH20] 8 cmH20   Intake/Output Summary (Last 24 hours) at 05/14/2019 1102 Last data filed at 05/14/2019 1000 Gross per 24 hour  Intake 1905.67 ml  Output 6162 ml  Net -4256.33 ml   Filed Weights   05/11/19 0500 05/12/19 0415 05/14/19 0500  Weight: 57.9 kg 57.8 kg 53.4 kg    Examination:   General appearance:  Frail, cachectic elderly man lying in bed in no acute distress Eyes:  Anicteric, tracking HENT:  [place]/AT, NGT Neck: Trach in place, no significant bleeding or secretions Lungs:  Clear to auscultation bilaterally.  Breathing comfortably on trach collar. CV:  Regular rate and rhythm Abdomen:  Soft, minimally tender to palpation, no guarding Extremities:  No clubbing, cyanosis, or edema  neuro:  Awake and alert, answering questions appropriately.  Globally weak.  CXR 4/28 personally reviewed- improving bilateral airspace disease, trach in place  Resolved Hospital Problem list     Assessment & Plan:  Acute hypoxic respiratory failure, multifactorial.  Appears to be in worse pulmonary edema today, unable to tolerate trach collar today. -Continue nocturnal ventilation.  Daily trach collar trials. -Continue to follow BAL cultures -Xopenex as needed -Discussed BAL and previous cultures showing Candida.  Not unreasonable to treat with antifungals, although this may be a colonizer. -VAP prevention protocol  HFrEF (20-25%), cardiogenic shock, NSTEMI with three-vessel disease, NSVT Polymorphic VT -Management per primary and heart failure service. Remains on midodrine, norepinephrine, vasopressin -Maintain euvolemia on CRRT -Off amiodarone per cardiology.  Elevated ESR; leukocytosis-improving -Continue to follow respiratory cultures  Severe Malnutrition -Continue enteral nutrition  AKI, possibly ATN in the setting of cardiogenic shock, anuric  -Appreciate nephrology  assistance.  Continue CRRT.  Likely will require HD long-term, which may be a limitation with his blood pressures.  Diabetes mellitus  -Insulin dosing per pharmacy  Elevated transaminase enzymes with cholestasis pattern, concern for congestive hepatopathy -Continue to monitor  Best practice:  Diet: Tube feeds Pain/Anxiety/Delirium protocol (if indicated):Fentanyl VAP protocol (if indicated):HOB 30 degrees,  DVT prophylaxis: heparin  GI prophylaxis: PPI Glucose control:SSI Mobility: BR Code Status: Full  Family Communication:per primary team; wife updated at bedside during exam Disposition:ICU  Labs   CBC: Recent Labs  Lab 05/11/19 0328 05/11/19 0328 05/11/19 2325 05/11/19 2325 05/12/19 0232 05/12/19 0232 05/12/19 0330 05/13/19 0555 05/13/19 1029 05/14/19 0325 05/14/19 0505  WBC 15.7*  --  17.0*  --  17.8*  --   --  16.3*  --   --  11.7*  HGB 7.7*   < > 10.3*   < > 10.3*   < > 11.2* 10.4* 11.6* 11.6* 10.4*  HCT 26.2*   < > 33.3*   < > 34.1*   < > 33.0* 33.9* 34.0* 34.0* 34.1*  MCV 97.8  --  96.2  --  96.9  --   --  97.7  --   --  98.8  PLT 324  --  276  --  303  --   --  253  --   --  241   < > = values in this interval not displayed.    Basic Metabolic Panel: Recent Labs  Lab 05/10/19  7867 05/10/19 0411 05/11/19 0328 05/11/19 1502 05/12/19 0232 05/12/19 0330 05/12/19 1600 05/12/19 1600 05/13/19 0555 05/13/19 1029 05/13/19 1600 05/14/19 0325 05/14/19 0505  NA 135   < > 136  135   < > 134*   < > 133*   < > 133* 136 133* 135 133*  K 4.4   < > 4.4  4.4   < > 4.1   < > 4.7   < > 4.1 4.3 4.7 4.9 5.2*  CL 102   < > 104  103   < > 101  --  100  --  100  --  100  --  100  CO2 24   < > 27  26   < > 25  --  25  --  27  --  25  --  21*  GLUCOSE 158*   < > 127*  128*   < > 199*  --  132*  --  239*  --  171*  --  223*  BUN 29*   < > 34*  34*   < > 30*  --  31*  --  32*  --  32*  --  33*  CREATININE 1.00   < > 1.03  1.04   < > 0.93  --  0.95  --  0.88   --  0.87  --  0.92  CALCIUM 7.7*   < > 7.8*  7.7*   < > 7.7*  --  7.8*  --  7.9*  --  8.3*  --  8.5*  MG 2.5*  --  2.6*  --  2.5*  --   --   --  2.5*  --   --   --  2.7*  PHOS 2.8   < > 2.9   < > 2.9  --  2.8  --  2.8  --  3.3  --  3.6   < > = values in this interval not displayed.   GFR: Estimated Creatinine Clearance: 63.7 mL/min (by C-G formula based on SCr of 0.92 mg/dL). Recent Labs  Lab 05/08/19 0500 05/08/19 0500 05/09/19 0343 05/10/19 0329 05/11/19 2325 05/12/19 0232 05/13/19 0555 05/14/19 0505  PROCALCITON 1.28  --  1.69  --   --   --   --   --   WBC 18.4*   < > 15.8*   < > 17.0* 17.8* 16.3* 11.7*   < > = values in this interval not displayed.    Liver Function Tests: Recent Labs  Lab 05/10/19 0329 05/10/19 1606 05/11/19 0328 05/11/19 1502 05/12/19 0232 05/12/19 1600 05/13/19 0555 05/13/19 1600 05/14/19 0505  AST 64*  --  46*  --  43*  --  53*  --  34  ALT 100*  --  79*  --  66*  --  64*  --  54*  ALKPHOS 584*  --  531*  --  504*  --  525*  --  482*  BILITOT 1.3*  --  0.9  --  1.1  --  0.8  --  1.0  PROT 6.2*  --  6.0*  --  6.4*  --  6.6  --  7.2  ALBUMIN 1.8*   < > 1.7*  1.6*   < > 1.7* 1.7* 1.7*  1.7* 2.6* 2.3*  2.4*   < > = values in this interval not displayed.   Recent Labs  Lab 05/07/19 1817  AMYLASE 58   No results for  input(s): AMMONIA in the last 168 hours.  ABG    Component Value Date/Time   PHART 7.383 05/14/2019 0325   PCO2ART 45.0 05/14/2019 0325   PO2ART 85 05/14/2019 0325   HCO3 26.8 05/14/2019 0325   TCO2 28 05/14/2019 0325   ACIDBASEDEF 3.0 (H) 04/28/2019 0735   O2SAT 61.2 05/14/2019 0505     Coagulation Profile: No results for input(s): INR, PROTIME in the last 168 hours.  Cardiac Enzymes: No results for input(s): CKTOTAL, CKMB, CKMBINDEX, TROPONINI in the last 168 hours.  HbA1C: HbA1c, POC (controlled diabetic range)  Date/Time Value Ref Range Status  01/14/2019 02:02 PM 12.5 (A) 0.0 - 7.0 % Final  08/21/2017  02:03 PM 8.5 (A) 0.0 - 7.0 % Final   Hgb A1c MFr Bld  Date/Time Value Ref Range Status  03/19/2019 09:49 PM 9.0 (H) 4.8 - 5.6 % Final    Comment:    (NOTE) Pre diabetes:          5.7%-6.4% Diabetes:              >6.4% Glycemic control for   <7.0% adults with diabetes     CBG: Recent Labs  Lab 05/13/19 1534 05/13/19 2017 05/14/19 0017 05/14/19 0501 05/14/19 0729  GLUCAP 135* 206* 234* 189* Jennings Zayneb Baucum, DO 05/14/19 11:49 AM [facility]

## 2019-05-14 NOTE — Progress Notes (Signed)
SLP Cancellation Note  Patient Details Name: Brendan [name] MRN: [id number] DOB: 1955-11-22   Cancelled treatment:       Reason Eval/Treat Not Completed: Medical issues which prohibited therapy. RT indicates pt not tolerating cuff deflation at this time - desats quickly. Will defer PMSV and PO trial at this time, and continue efforts as pt status improves.  Seanmichael Salmons B. Quentin Ore, [facility], [place] Speech Language Pathologist Office: [phone] Pager: [phone]  Shonna Chock 05/14/2019, 2:51 PM

## 2019-05-14 NOTE — Progress Notes (Signed)
[facility] Progress Note:   68M dialysis dependent AKI; cardiogenic shock status post CABG + MV replacement 4/2  S: Seen and examined on CRRT and procedure supervised.  Has had 5.5 liters UF over 4/27's 24 hour period with CRRT thus far per charting.  Nursing reports that he had been pulling 100 cc/hr during day shift.  Levo is at 3 mcg/min and vaso.  milrinone off.  Review of systems:   Denies air hunger Generalized pain but better  Denies n/v   O: 04/27 0701 - 04/28 0700 In: 2206.2 [I.V.:310.3; NG/GT:1819.5; IV Piggyback:76.4] Out: 6264 [Stool:725]  Filed Weights   05/10/19 0415 05/11/19 0500 05/12/19 0415  Weight: 57.4 kg 57.9 kg 57.8 kg    Physical exam:    General adult male in bed critically ill  HEENT normocephalic atraumatic extraocular movements intact sclera anicteric Lungs coarse mechanical breath sounds   Heart S1S2 on pressors Abdomen soft nontender nondistended Extremities 1+ pedal edema  Neuro - awake and interactive  Psych no anxiety or agitation Access: left chest catheter - nontunneled    Recent Labs  Lab 05/12/19 1600 05/12/19 1600 05/13/19 0555 05/13/19 0555 05/13/19 1029 05/13/19 1600 05/14/19 0325  NA 133*   < > 133*   < > 136 133* 135  K 4.7   < > 4.1   < > 4.3 4.7 4.9  CL 100  --  100  --   --  100  --   CO2 25  --  27  --   --  25  --   GLUCOSE 132*  --  239*  --   --  171*  --   BUN 31*  --  32*  --   --  32*  --   CREATININE 0.95  --  0.88  --   --  0.87  --   CALCIUM 7.8*  --  7.9*  --   --  8.3*  --   PHOS 2.8  --  2.8  --   --  3.3  --    < > = values in this interval not displayed.   Recent Labs  Lab 05/12/19 0232 05/12/19 0330 05/13/19 0555 05/13/19 0555 05/13/19 1029 05/14/19 0325 05/14/19 0505  WBC 17.8*  --  16.3*  --   --   --  11.7*  HGB 10.3*   < > 10.4*   < > 11.6* 11.6* 10.4*  HCT 34.1*   < > 33.9*   < > 34.0* 34.0* 34.1*  MCV 96.9  --  97.7  --   --   --  98.8  PLT 303  --  253  --   --   --  241   < > =  values in this interval not displayed.    Scheduled Meds: . aspirin EC  325 mg Oral Daily   Or  . aspirin  324 mg Per Tube Daily  . B-complex with vitamin C  1 tablet Per Tube Daily  . chlorhexidine gluconate (MEDLINE KIT)  15 mL Mouth Rinse BID  . Chlorhexidine Gluconate Cloth  6 each Topical Daily  . docusate  200 mg Per Tube Daily  . feeding supplement (PRO-STAT SUGAR FREE 64)  30 mL Oral TID  . gabapentin  300 mg Per Tube Q8H  . insulin aspart  0-24 Units Subcutaneous Q4H  . insulin aspart  2 Units Subcutaneous Q4H  . insulin glargine  10 Units Subcutaneous BID  . mouth rinse  15  mL Mouth Rinse 10 times per day  . methylPREDNISolone (SOLU-MEDROL) injection  80 mg Intravenous BID  . metoCLOPramide (REGLAN) injection  10 mg Intravenous Q8H  . midodrine  15 mg Per Tube Q8H  . neomycin-polymyxin-hydrocortisone  3 drop Left EAR Q12H  . oxyCODONE  10 mg Per Tube Q6H  . pantoprazole sodium  40 mg Per Tube BID  . pneumococcal 23 valent vaccine  0.5 mL Intramuscular Tomorrow-1000  . sodium chloride flush  10-40 mL Intracatheter Q12H   Continuous Infusions: .  prismasol BGK 4/2.5 400 mL/hr at 05/13/19 1600  .  prismasol BGK 4/2.5 200 mL/hr at 05/13/19 1210  . sodium chloride 10 mL/hr at 05/06/19 0600  . epinephrine Stopped (04/28/19 0138)  . feeding supplement (VITAL 1.5 CAL) 1,000 mL (05/13/19 1811)  . heparin 10,000 units/ 20 mL infusion syringe 1,000 Units/hr (05/14/19 0138)  . lactated ringers Stopped (05/01/2019 1640)  . lactated ringers Stopped (04/30/19 0930)  . norepinephrine (LEVOPHED) Adult infusion 3 mcg/min (05/14/19 0400)  . prismasol BGK 4/2.5 1,800 mL/hr at 05/14/19 0310  . vasopressin (PITRESSIN) infusion - *FOR SHOCK* 0.02 Units/min (05/14/19 0400)   PRN Meds:.Place/Maintain arterial line **AND** sodium chloride, alteplase, Gerhardt's butt cream, heparin, levalbuterol, ondansetron (ZOFRAN) IV, ondansetron (ZOFRAN) IV, oxyCODONE, sodium chloride, sodium chloride  flush  ABG    Component Value Date/Time   PHART 7.383 05/14/2019 0325   PCO2ART 45.0 05/14/2019 0325   PO2ART 85 05/14/2019 0325   HCO3 26.8 05/14/2019 0325   TCO2 28 05/14/2019 0325   ACIDBASEDEF 3.0 (H) 04/28/2019 0735   O2SAT 96.0 05/14/2019 0325    A/P  1. Dialysis dependent AKI, anuric.  CRRT started 4/10.  On all 4K baths.  Decrease UF goal to neg 50 an hour.  Volume overload improving. Please page nephrology for any CRRT changes.  on fixed dose heparin in CRRT circuit.  Will follow AM labs 2. Hypophosphatemia, from CRRT, req intermittent repletion if < 2.5  3. Enterococcus faecalis pneumonia; per primary and CCM 4. Acute hypoxic respiratory failure s/p trach 4/16 5. Cardiogenic shock on inotrope/pressors   6. CAD status post CABG 7. Mitral regurgitation status post mitral valve repair 8. diabetes mellitus 2 - per primary  9. Acute blood loss anemia - improved and stable  10. Recurrent polymorphic ventricular tachycardia, s/p amiodarone    Claudia Desanctis 05/14/2019  5:42 AM

## 2019-05-15 DIAGNOSIS — J810 Acute pulmonary edema: Secondary | ICD-10-CM

## 2019-05-15 DIAGNOSIS — R570 Cardiogenic shock: Secondary | ICD-10-CM

## 2019-05-15 DIAGNOSIS — J9601 Acute respiratory failure with hypoxia: Secondary | ICD-10-CM

## 2019-05-15 LAB — CBC
HCT: 33.6 % — ABNORMAL LOW (ref 39.0–52.0)
Hemoglobin: 10.1 g/dL — ABNORMAL LOW (ref 13.0–17.0)
MCH: 29.7 pg (ref 26.0–34.0)
MCHC: 30.1 g/dL (ref 30.0–36.0)
MCV: 98.8 fL (ref 80.0–100.0)
Platelets: 261 10*3/uL (ref 150–400)
RBC: 3.4 MIL/uL — ABNORMAL LOW (ref 4.22–5.81)
RDW: 19 % — ABNORMAL HIGH (ref 11.5–15.5)
WBC: 14.6 10*3/uL — ABNORMAL HIGH (ref 4.0–10.5)
nRBC: 0 % (ref 0.0–0.2)

## 2019-05-15 LAB — CULTURE, RESPIRATORY W GRAM STAIN

## 2019-05-15 LAB — POCT I-STAT 7, (LYTES, BLD GAS, ICA,H+H)
Acid-Base Excess: 1 mmol/L (ref 0.0–2.0)
Bicarbonate: 27.3 mmol/L (ref 20.0–28.0)
Calcium, Ion: 1.24 mmol/L (ref 1.15–1.40)
HCT: 33 % — ABNORMAL LOW (ref 39.0–52.0)
Hemoglobin: 11.2 g/dL — ABNORMAL LOW (ref 13.0–17.0)
O2 Saturation: 93 %
Patient temperature: 98
Potassium: 5.1 mmol/L (ref 3.5–5.1)
Sodium: 133 mmol/L — ABNORMAL LOW (ref 135–145)
TCO2: 29 mmol/L (ref 22–32)
pCO2 arterial: 47.1 mmHg (ref 32.0–48.0)
pH, Arterial: 7.369 (ref 7.350–7.450)
pO2, Arterial: 69 mmHg — ABNORMAL LOW (ref 83.0–108.0)

## 2019-05-15 LAB — CULTURE, BAL-QUANTITATIVE W GRAM STAIN: Culture: 60000 — AB

## 2019-05-15 LAB — GLUCOSE, CAPILLARY
Glucose-Capillary: 136 mg/dL — ABNORMAL HIGH (ref 70–99)
Glucose-Capillary: 181 mg/dL — ABNORMAL HIGH (ref 70–99)
Glucose-Capillary: 186 mg/dL — ABNORMAL HIGH (ref 70–99)
Glucose-Capillary: 194 mg/dL — ABNORMAL HIGH (ref 70–99)
Glucose-Capillary: 207 mg/dL — ABNORMAL HIGH (ref 70–99)
Glucose-Capillary: 215 mg/dL — ABNORMAL HIGH (ref 70–99)

## 2019-05-15 LAB — HEPATIC FUNCTION PANEL
ALT: 56 U/L — ABNORMAL HIGH (ref 0–44)
AST: 44 U/L — ABNORMAL HIGH (ref 15–41)
Albumin: 2.2 g/dL — ABNORMAL LOW (ref 3.5–5.0)
Alkaline Phosphatase: 469 U/L — ABNORMAL HIGH (ref 38–126)
Bilirubin, Direct: 0.1 mg/dL (ref 0.0–0.2)
Indirect Bilirubin: 0.7 mg/dL (ref 0.3–0.9)
Total Bilirubin: 0.8 mg/dL (ref 0.3–1.2)
Total Protein: 7 g/dL (ref 6.5–8.1)

## 2019-05-15 LAB — MAGNESIUM: Magnesium: 2.7 mg/dL — ABNORMAL HIGH (ref 1.7–2.4)

## 2019-05-15 LAB — RENAL FUNCTION PANEL
Albumin: 2.2 g/dL — ABNORMAL LOW (ref 3.5–5.0)
Anion gap: 7 (ref 5–15)
BUN: 34 mg/dL — ABNORMAL HIGH (ref 8–23)
CO2: 27 mmol/L (ref 22–32)
Calcium: 8.4 mg/dL — ABNORMAL LOW (ref 8.9–10.3)
Chloride: 101 mmol/L (ref 98–111)
Creatinine, Ser: 0.91 mg/dL (ref 0.61–1.24)
GFR calc Af Amer: >60 mL/min (ref >60)
GFR calc non Af Amer: >60 mL/min (ref >60)
Glucose, Bld: 226 mg/dL — ABNORMAL HIGH (ref 70–99)
Phosphorus: 3.4 mg/dL (ref 2.5–4.6)
Potassium: 5 mmol/L (ref 3.5–5.1)
Sodium: 135 mmol/L (ref 135–145)

## 2019-05-15 LAB — COOXEMETRY PANEL
Carboxyhemoglobin: 1.6 % — ABNORMAL HIGH (ref 0.5–1.5)
Methemoglobin: 1.2 % (ref 0.0–1.5)
O2 Saturation: 52.4 %
Total hemoglobin: 10.5 g/dL — ABNORMAL LOW (ref 12.0–16.0)

## 2019-05-15 LAB — CYTOLOGY - NON PAP

## 2019-05-15 MED ORDER — LABETALOL HCL 5 MG/ML IV SOLN: 10.0000 mg | INTRAVENOUS | Status: DC | PRN

## 2019-05-15 MED ORDER — HEPARIN SODIUM (PORCINE) 1000 UNIT/ML DIALYSIS
1000.0000 [IU] | INTRAMUSCULAR | Status: DC | PRN
  Administered 2019-05-15: 3200 [IU] via INTRAVENOUS_CENTRAL
  Filled 2019-05-15 (×2): qty 6
  Filled 2019-05-15: qty 3
  Filled 2019-05-15: qty 6
  Filled 2019-05-15: qty 1
  Filled 2019-05-15: qty 6

## 2019-05-15 MED ORDER — SODIUM ZIRCONIUM CYCLOSILICATE 10 G PO PACK
10.0000 g | PACK | Freq: Once | ORAL | Status: AC
  Administered 2019-05-15: 10 g via ORAL
  Filled 2019-05-15: qty 1

## 2019-05-15 NOTE — Progress Notes (Addendum)
Occupational Therapy Treatment Patient Details Name: Brendan [name] MRN: [id number] DOB: April 07, 1955 Today's Date: 05/15/2019    History of present illness Pt presented with SOB and fatigue at [facility] found to have new HFrEF,  NSTEMI, and bilateral pleural effusions transferred to [facility] on 3/29 for further cardiac evaluation.  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  On the evening of 4/1, patient developed worsening respiratory distress and hypoxia, head R/L heart cath on 3/30 & R heart cath on 4/1 pt intubated on 4/1, extubated 4/4, had R & L chest tubes placed 4/2 due to bilateral pleural effusions, removed 4/5 and impella device placed 4/2.  CABG and MVR on 4/6. Extubated 4/7. Removal of impella 4/9. CVVH started 4/10. Respiratory distress and reintubated 4/12. Extubated 4/14. Reintubated 4/15. Trach 4/16.  PMHx of Tobacco abuse, HTN, poorly controlled DM, diabetic neuropathy   OT comments  Pt progressing well toward stated goals. Session focused on BADL mobility progression. Bed placed in egress position. Pt reporting dizziness with position change, improved with time. BP stable. Pt then assisted to scoot hips to edge of egress position. Sit <> stand then completed with mod A +2. Pt able to power up well, but has poor trunk control and hip extension to maintain stable standing. Pt reports increased dizziness with standing, again with BP stable. Pt on trach collar at 8L and 35% FiO2 with SpO2 stable. Suction performed with yaunker x1 and deep suction by RN x1 in session. Updated d/c recs to [facility] to reflect progress. Will continue to follow.   Follow Up Recommendations  LTACH    Equipment Recommendations  None recommended by OT    Recommendations for Other Services      Precautions / Restrictions Precautions Precautions: Fall;Sternal Precaution Comments: CRRT (off this session), flexiseal, 35% trach collar Restrictions Weight Bearing Restrictions: No       Mobility Bed Mobility                General bed mobility comments: used bed egress mode; pt requires assist to scoot hips with mattress deflated, able to actively use rails to assist with bed mobility  Transfers Overall transfer level: Needs assistance Equipment used: 2 person hand held assist Transfers: Sit to/from Stand Sit to Stand: Mod assist;+2 physical assistance;+2 safety/equipment         General transfer comment: assist to power up into standing with knees blocked; multimodal cues for upright posture and assist for hip extension and maintaining standing     Balance Overall balance assessment: Needs assistance Sitting-balance support: Bilateral upper extremity supported Sitting balance-Leahy Scale: Poor Sitting balance - Comments: UE support for sitting EOB prior to sit to stand or leaning back on bed. able to maintain ~1-2 minutes with trunk off of bed   Standing balance support: Bilateral upper extremity supported Standing balance-Leahy Scale: Poor Standing balance comment: mod A +2 to maintain standing, difficulty extending hips and maintaining upright posture                           ADL either performed or assessed with clinical judgement   ADL Overall ADL's : Needs assistance/impaired     Grooming: Minimal assistance;Sitting               Lower Body Dressing: Moderate assistance;Bed level Lower Body Dressing Details (indicate cue type and reason): while supine, OT started socks over toes, pt able to pull up over heels while OT helped hold figure  4 position             Functional mobility during ADLs: Moderate assistance;+2 for physical assistance;+2 for safety/equipment(sit <> stand only)       Vision Baseline Vision/History: Wears glasses Wears Glasses: Reading only Patient Visual Report: No change from baseline     Perception     Praxis      Cognition Arousal/Alertness: Awake/alert Behavior During Therapy: WFL for tasks assessed/performed Overall  Cognitive Status: Difficult to assess                                 General Comments: appropriately following all commands this date; mouthing words to communicate appropriately to questions        Exercises     Shoulder Instructions       General Comments pt with c/o dizziness with postural changes; BP in sitting 103/61 (73) and just after standing 91/62 (72)    Pertinent Vitals/ Pain       Pain Assessment: Faces Faces Pain Scale: Hurts little more Pain Location: gneralized with mobility Pain Descriptors / Indicators: Grimacing Pain Intervention(s): Monitored during session;Repositioned  Home Living                                          Prior Functioning/Environment              Frequency  Min 2X/week        Progress Toward Goals  OT Goals(current goals can now be found in the care plan section)  Progress towards OT goals: Progressing toward goals  Acute Rehab OT Goals OT Goal Formulation: Patient unable to participate in goal setting Time For Goal Achievement: 05/26/19 Potential to Achieve Goals: Good  Plan Discharge plan needs to be updated    Co-evaluation    PT/OT/SLP Co-Evaluation/Treatment: Yes Reason for Co-Treatment: For patient/therapist safety;Complexity of the patient's impairments (multi-system involvement);To address functional/ADL transfers PT goals addressed during session: Mobility/safety with mobility OT goals addressed during session: ADL's and self-care;Strengthening/ROM      AM-PAC OT "6 Clicks" Daily Activity     Outcome Measure   Help from another person eating meals?: A Little Help from another person taking care of personal grooming?: A Little Help from another person toileting, which includes using toliet, bedpan, or urinal?: A Lot Help from another person bathing (including washing, rinsing, drying)?: A Lot Help from another person to put on and taking off regular upper body clothing?: A  Lot Help from another person to put on and taking off regular lower body clothing?: A Lot 6 Click Score: 14    End of Session    OT Visit Diagnosis: Unsteadiness on feet (R26.81);Muscle weakness (generalized) (M62.81)   Activity Tolerance Patient tolerated treatment well   Patient Left in bed;with call bell/phone within reach;with family/visitor present   Nurse Communication Mobility status        Time: 6213-0865 OT Time Calculation (min): 35 min  Charges: OT General Charges $OT Visit: 1 Visit OT Treatments $Self Care/Home Management : 8-22 mins  Zenovia Jarred, MSOT, OTR/L [place] [facility] Office Number: [phone] Pager: [phone]  Zenovia Jarred 05/15/2019, 5:30 PM

## 2019-05-15 NOTE — Progress Notes (Signed)
SLP Cancellation Note  Patient Details Name: Brendan [name] MRN: [id number] DOB: Nov 08, 1955   Cancelled treatment:        RN reported pt is desating and is would not be appropriate for PMV tx or swallow assessment today. Will continue efforts.    Houston Siren 05/15/2019, 1:26 PM  Orbie Pyo Colvin Caroli.Ed Risk analyst [phone] Office [phone]

## 2019-05-15 NOTE — Progress Notes (Signed)
Dr. Royce Macadamia was notified that we are unable to change the Effluent Dose to 60ml/kg/hr on the CRRT without altering the entire CRRT prescription. Per Dr. Royce Macadamia keep current CRRT prescription

## 2019-05-15 NOTE — Progress Notes (Addendum)
TCTS DAILY ICU PROGRESS NOTE                   [address]            [place] [address]          [phone]   13 Days Post-Op Procedure(s) (LRB): VIDEO BRONCHOSCOPY USING DISPOSABLE ANESTHESIA SCOPE (N/A) TRACHEOSTOMY (N/A)  Total Length of Stay:  LOS: 31 days   Subjective: Pt awake and alert this am. He states he has some discomfort in his belly and occasional nausea (but no emesis).  Objective: Vital signs in last 24 hours: Temp:  [97.6 F (36.4 C)-98.8 F (37.1 C)] 98.8 F (37.1 C) (04/29 0000) Pulse Rate:  [50-68] 68 (04/29 1030) Cardiac Rhythm: Sinus bradycardia (04/29 0800) Resp:  [8-24] 15 (04/29 1030) BP: (78-107)/(45-77) 92/57 (04/29 1030) SpO2:  [82 %-100 %] 100 % (04/29 1030) FiO2 (%):  [35 %-60 %] 60 % (04/29 0838) Weight:  [52 kg] 52 kg (04/29 0500)  Filed Weights   05/12/19 0415 05/14/19 0500 05/15/19 0500  Weight: 57.8 kg 53.4 kg 52 kg    Weight change: -1.4 kg   Hemodynamic parameters for last 24 hours: CVP:  [2 mmHg-8 mmHg] 4 mmHg  Intake/Output from previous day: 04/28 0701 - 04/29 0700 In: 1537.8 [I.V.:217.8; NG/GT:1320] Out: 2911 [Stool:400]  Intake/Output this shift: Total I/O In: 193.1 [I.V.:28.1; NG/GT:165] Out: 199 [Other:199]  Current Meds: Scheduled Meds: . aspirin EC  325 mg Oral Daily   Or  . aspirin  324 mg Per Tube Daily  . B-complex with vitamin C  1 tablet Per Tube Daily  . chlorhexidine gluconate (MEDLINE KIT)  15 mL Mouth Rinse BID  . Chlorhexidine Gluconate Cloth  6 each Topical Daily  . docusate  200 mg Per Tube Daily  . feeding supplement (PRO-STAT SUGAR FREE 64)  30 mL Oral TID  . fluconazole  200 mg Per Tube Daily  . gabapentin  300 mg Per Tube Q8H  . insulin aspart  0-24 Units Subcutaneous Q4H  . insulin aspart  2 Units Subcutaneous Q4H  . insulin glargine  14 Units Subcutaneous BID  . mouth rinse  15 mL Mouth Rinse 10 times per day  . metoCLOPramide (REGLAN) injection  10 mg Intravenous Q8H   . midodrine  15 mg Per Tube Q8H  . neomycin-polymyxin-hydrocortisone  3 drop Left EAR Q12H  . oxyCODONE  10 mg Per Tube Q6H  . pantoprazole sodium  40 mg Per Tube BID  . pneumococcal 23 valent vaccine  0.5 mL Intramuscular Tomorrow-1000  . sodium chloride flush  10-40 mL Intracatheter Q12H   Continuous Infusions: .  prismasol BGK 4/2.5 400 mL/hr at 05/15/19 0132  .  prismasol BGK 4/2.5 200 mL/hr at 05/15/19 0132  . epinephrine Stopped (04/28/19 0138)  . feeding supplement (VITAL 1.5 CAL) 55 mL/hr at 05/14/19 1900  . heparin 10,000 units/ 20 mL infusion syringe 1,000 Units/hr (05/15/19 0624)  . lactated ringers Stopped (05/08/2019 1640)  . lactated ringers Stopped (04/30/19 0930)  . norepinephrine (LEVOPHED) Adult infusion 2 mcg/min (05/15/19 1000)  . prismasol BGK 4/2.5 1,500 mL/hr at 05/15/19 0839  . vasopressin (PITRESSIN) infusion - *FOR SHOCK* 0.02 Units/min (05/15/19 1000)   PRN Meds:.alteplase, Gerhardt's butt cream, heparin, levalbuterol, ondansetron (ZOFRAN) IV, ondansetron (ZOFRAN) IV, oxyCODONE, sodium chloride, sodium chloride flush  Heart: RRR Lungs: Mostly clear Abdomen: Soft, non tender,  bowel sounds present Extremities: Kerlex and ACE wraps in place Wound: Sternal dressing  is clean and dry;right axillary wound is clean and dry.   Lab Results: CBC: Recent Labs    05/14/19 0505 05/14/19 0505 05/15/19 0302 05/15/19 0443  WBC 11.7*  --  14.6*  --   HGB 10.4*   < > 10.1* 11.2*  HCT 34.1*   < > 33.6* 33.0*  PLT 241  --  261  --    < > = values in this interval not displayed.   BMET:  Recent Labs    05/14/19 1625 05/14/19 1625 05/15/19 0302 05/15/19 0443  NA 134*   < > 135 133*  K 4.7   < > 5.0 5.1  CL 102  --  101  --   CO2 26  --  27  --   GLUCOSE 182*  --  226*  --   BUN 35*  --  34*  --   CREATININE 0.93  --  0.91  --   CALCIUM 8.2*  --  8.4*  --    < > = values in this interval not displayed.    CMET: Lab Results  Component Value Date   WBC  14.6 (H) 05/15/2019   HGB 11.2 (L) 05/15/2019   HCT 33.0 (L) 05/15/2019   PLT 261 05/15/2019   GLUCOSE 226 (H) 05/15/2019   CHOL 174 08/21/2017   TRIG 143 08/21/2017   HDL 57 08/21/2017   LDLCALC 88 08/21/2017   ALT 56 (H) 05/15/2019   AST 44 (H) 05/15/2019   NA 133 (L) 05/15/2019   K 5.1 05/15/2019   CL 101 05/15/2019   CREATININE 0.91 05/15/2019   BUN 34 (H) 05/15/2019   CO2 27 05/15/2019   TSH 0.925 03/25/2019   INR 1.6 (H) 05/01/2019   HGBA1C 9.0 (H) 04/02/2019   MICROALBUR 30 08/21/2017    PT/INR:  No results for input(s): LABPROT, INR in the last 72 hours. Radiology: No results found.  Assessment/Plan: S/P Procedure(s) (LRB): VIDEO BRONCHOSCOPY USING DISPOSABLE ANESTHESIA SCOPE (N/A) TRACHEOSTOMY (N/A)  1. CV-S/p removal of Impella on 04/09.  S/p V tach arrest 04/15. Previous a fib. SR with HR in the 70's this am.  He was paced yesterday (to help with CO but unable to pace after 9 pm-? If wires detached so will discuss if able to remove EPW with surgeon). On  Midodrine 15 mg tid. Also, on Nor epinephrine drip at 2 mcg/min and Vasopressin .02 units/min drips. Weaning drips as able.  Co ox this am decreased to 52.4 (off Milrinone) 2. Pulmonary-S/p trach 04/16. ABG results this am noted.On trach collar most of the day and ventilator at night.  3. Expected post op blood loss anemia-H and H this am stable at 10.1 and 33.6 4. DM-CBGs 120/215/207. On Insulin. He is on Insulin and this was increased for better glucose control. He was on Metformin 1000 mg bid prior to surgery, but will continue on Insulin for now as NPO. Pre op HGA1C 9. He will need close medical follow up after discharge 5. AKI-Anuric. Creatinine stable at 0.91 this am. Nephrology following and arranging for CVVHD accordingly 6. GI-severe malnutrition of chronic illness.  Cortrak, TFs. Speech pathology evaluation done yesterday and recommendations to be followed accordingly. Albumin increased to 2.4 7. Elevated  transaminases (likely related to shock)-AST 44, ALT 56, ALk phos 469 and are stable   8. ID-Leukocytosis decreased to WBC to 11,700 (likely related to aspiration). Previous yeast in sputum and was on Zyvox and Zosyn for broader coverage. Of note, he  completed Fluconazole for Candida in urine. Completed Vanco and Cefepime for Enterococcus Faecalis. ESR was elevated so has been given Solu Medrol, which may increase WBC 9. Acute systolic heart failure-CVVHD helping with volume status 11. Extremely deconditioned-will need PT as able. May need LTAC once ready for discharge 12. Remove right axillar staples. Will discuss with surgeon if ok to remove EPW  [name] PA-C 05/15/2019 10:35 AM    I have seen and examined the patient and agree with the assessment and plan as outlined.  Rexene Alberts, MD 05/15/2019

## 2019-05-15 NOTE — Progress Notes (Addendum)
[facility] Progress Note:   11M dialysis dependent AKI; cardiogenic shock status post CABG + MV replacement 4/2  S: Seen and examined on CRRT and procedure supervised.  Has had 2.1 liters UF over 4/28's 24 hour period with CRRT thus far per charting.   Levo is at 1 mcg/min and on vaso.   Review of systems:    Denies air hunger Generalized pain - a little better  Nausea no vomiting    O: 04/28 0701 - 04/29 0700 In: 1410.5 [I.V.:200.5; NG/GT:1210] Out: 2307 [Stool:200]  Filed Weights   05/11/19 0500 05/12/19 0415 05/14/19 0500  Weight: 57.9 kg 57.8 kg 53.4 kg    Physical exam:   General adult male in bed critically ill  HEENT normocephalic atraumatic extraocular movements intact sclera anicteric Lungs coarse mechanical breath sounds   Heart S1S2 on pressors Abdomen soft nontender nondistended Extremities 1+ pedal edema  Neuro - awake and interactive  Psych no anxiety or agitation Access: left subclavian temporary HD catheter   Recent Labs  Lab 05/13/19 1600 05/14/19 0325 05/14/19 0505 05/14/19 1625 05/15/19 0443  NA 133*   < > 133* 134* 133*  K 4.7   < > 5.2* 4.7 5.1  CL 100  --  100 102  --   CO2 25  --  21* 26  --   GLUCOSE 171*  --  223* 182*  --   BUN 32*  --  33* 35*  --   CREATININE 0.87  --  0.92 0.93  --   CALCIUM 8.3*  --  8.5* 8.2*  --   PHOS 3.3  --  3.6 3.2  --    < > = values in this interval not displayed.   Recent Labs  Lab 05/13/19 0555 05/13/19 1029 05/14/19 0505 05/15/19 0302 05/15/19 0443  WBC 16.3*  --  11.7* 14.6*  --   HGB 10.4*   < > 10.4* 10.1* 11.2*  HCT 33.9*   < > 34.1* 33.6* 33.0*  MCV 97.7  --  98.8 98.8  --   PLT 253  --  241 261  --    < > = values in this interval not displayed.    Scheduled Meds: . aspirin EC  325 mg Oral Daily   Or  . aspirin  324 mg Per Tube Daily  . B-complex with vitamin C  1 tablet Per Tube Daily  . chlorhexidine gluconate (MEDLINE KIT)  15 mL Mouth Rinse BID  . Chlorhexidine Gluconate  Cloth  6 each Topical Daily  . docusate  200 mg Per Tube Daily  . feeding supplement (PRO-STAT SUGAR FREE 64)  30 mL Oral TID  . fluconazole  200 mg Per Tube Daily  . gabapentin  300 mg Per Tube Q8H  . insulin aspart  0-24 Units Subcutaneous Q4H  . insulin aspart  2 Units Subcutaneous Q4H  . insulin glargine  14 Units Subcutaneous BID  . mouth rinse  15 mL Mouth Rinse 10 times per day  . metoCLOPramide (REGLAN) injection  10 mg Intravenous Q8H  . midodrine  15 mg Per Tube Q8H  . neomycin-polymyxin-hydrocortisone  3 drop Left EAR Q12H  . oxyCODONE  10 mg Per Tube Q6H  . pantoprazole sodium  40 mg Per Tube BID  . pneumococcal 23 valent vaccine  0.5 mL Intramuscular Tomorrow-1000  . sodium chloride flush  10-40 mL Intracatheter Q12H   Continuous Infusions: .  prismasol BGK 4/2.5 400 mL/hr at 05/15/19 0132  .  prismasol BGK 4/2.5 200 mL/hr at 05/15/19 0132  . sodium chloride 10 mL/hr at 05/06/19 0600  . epinephrine Stopped (04/28/19 0138)  . feeding supplement (VITAL 1.5 CAL) 55 mL/hr at 05/14/19 1900  . heparin 10,000 units/ 20 mL infusion syringe 1,000 Units/hr (05/14/19 2041)  . lactated ringers Stopped (05/08/2019 1640)  . lactated ringers Stopped (04/30/19 0930)  . norepinephrine (LEVOPHED) Adult infusion 1 mcg/min (05/15/19 0500)  . prismasol BGK 4/2.5 1,800 mL/hr at 05/15/19 0302  . vasopressin (PITRESSIN) infusion - *FOR SHOCK* 0.02 Units/min (05/15/19 0500)   PRN Meds:.Place/Maintain arterial line **AND** sodium chloride, alteplase, Gerhardt's butt cream, heparin, levalbuterol, ondansetron (ZOFRAN) IV, ondansetron (ZOFRAN) IV, oxyCODONE, sodium chloride, sodium chloride flush  ABG    Component Value Date/Time   PHART 7.369 05/15/2019 0443   PCO2ART 47.1 05/15/2019 0443   PO2ART 69 (L) 05/15/2019 0443   HCO3 27.3 05/15/2019 0443   TCO2 29 05/15/2019 0443   ACIDBASEDEF 3.0 (H) 04/28/2019 0735   O2SAT 93.0 05/15/2019 0443    A/P  1. Dialysis dependent AKI, anuric.  CRRT  started 4/10.  Left subclavian temp HD catheter on 4/13 with CTS.  Reduce dialysate to 1.5.  On all 4K baths.  Decrease UF goal to keep even.  Volume overload improved. Goal to transition to HD soon.  Please page nephrology for any CRRT changes.  on fixed dose heparin in CRRT circuit.    2. Hypophosphatemia, from CRRT, req intermittent repletion if < 2.5  3. Enterococcus faecalis pneumonia; per primary and CCM 4. Acute hypoxic respiratory failure s/p trach 4/16 5. Cardiogenic shock on pressors and midodrine per primary   6. CAD status post CABG 7. Mitral regurgitation status post mitral valve repair 8. diabetes mellitus 2 - per primary  9. Acute blood loss anemia - stable  10. Recurrent polymorphic ventricular tachycardia, s/p amiodarone    Claudia Desanctis 05/15/2019  5:45 AM   Discussed with Dr. Haroldine Laws.  Clearance and volume status good.  Will stop CRRT today.  lokelma once now.  Reassess volume status closely watch for HD needs.   Claudia Desanctis, MD 05/15/2019  1:30 PM

## 2019-05-15 NOTE — Progress Notes (Signed)
TCTS BRIEF SICU PROGRESS NOTE  13 Days Post-Op  S/P Procedure(s) (LRB): VIDEO BRONCHOSCOPY USING DISPOSABLE ANESTHESIA SCOPE (N/A) TRACHEOSTOMY (N/A)   Stable on T-collar all day CRRT now on hold BP marginal but stable off vasopressin, still on levophed  Plan: Continue current plan  Rexene Alberts, MD 05/15/2019 6:55 PM

## 2019-05-15 NOTE — Progress Notes (Signed)
Physical Therapy Treatment Patient Details Name: Brendan [name] MRN: [id number] DOB: Apr 11, 1955 Today's Date: 05/15/2019    History of Present Illness Pt presented with SOB and fatigue at [facility] found to have new HFrEF,  NSTEMI, and bilateral pleural effusions transferred to [facility] on 3/29 for further cardiac evaluation.  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  On the evening of 4/1, patient developed worsening respiratory distress and hypoxia, head R/L heart cath on 3/30 & R heart cath on 4/1 pt intubated on 4/1, extubated 4/4, had R & L chest tubes placed 4/2 due to bilateral pleural effusions, removed 4/5 and impella device placed 4/2.  CABG and MVR on 4/6. Extubated 4/7. Removal of impella 4/9. CVVH started 4/10. Respiratory distress and reintubated 4/12. Extubated 4/14. Reintubated 4/15. Trach 4/16.  PMHx of Tobacco abuse, HTN, poorly controlled DM, diabetic neuropathy    PT Comments    Patient seen for mobility progression and tolerated mobility well with VSS.  Pt not on CRRT this session. Pt is able to stand from bed in egress with mod A +2. Pt follows commands consistently and mouthing words to communicate. Wife present and supportive. PT will continue to follow acutely and progress as tolerated.    Follow Up Recommendations  LTACH     Equipment Recommendations  Other (comment)(TBA)    Recommendations for Other Services       Precautions / Restrictions Precautions Precautions: Fall;Sternal Precaution Comments: CRRT (off this session), flexiseal, 35% trach collar Restrictions Weight Bearing Restrictions: No    Mobility  Bed Mobility               General bed mobility comments: used bed egress mode; pt requires assist to scoot hips with mattress deflated; pt using rails to assist with bed mobility    Transfers Overall transfer level: Needs assistance Equipment used: 2 person hand held assist Transfers: Sit to/from Stand Sit to Stand: +2 safety/equipment;Mod assist;+2  physical assistance         General transfer comment: assist to power up into standing with knees blocked; multimodal cues for upright posture and assist for hip extension and maintaining standing   Ambulation/Gait             General Gait Details: unable   Stairs             Wheelchair Mobility    Modified Rankin (Stroke Patients Only)       Balance Overall balance assessment: Needs assistance Sitting-balance support: Bilateral upper extremity supported Sitting balance-Leahy Scale: Poor     Standing balance support: Bilateral upper extremity supported Standing balance-Leahy Scale: Poor                              Cognition Arousal/Alertness: Awake/alert Behavior During Therapy: WFL for tasks assessed/performed Overall Cognitive Status: Difficult to assess                                 General Comments: follows commands well; mouthing words to communicate; asking to sit up and wants therapy to come back tomorrow      Exercises      General Comments General comments (skin integrity, edema, etc.): pt with c/o dizziness with postural changes; BP in sitting 103/61 (73) and just after standing 91/62 (72)      Pertinent Vitals/Pain Pain Assessment: Faces Faces Pain Scale: Hurts little more Pain  Location: gneralized with mobility Pain Descriptors / Indicators: Grimacing Pain Intervention(s): Monitored during session;Limited activity within patient's tolerance;Repositioned    Home Living                      Prior Function            PT Goals (current goals can now be found in the care plan section) Progress towards PT goals: Progressing toward goals    Frequency    Min 3X/week      PT Plan Current plan remains appropriate    Co-evaluation PT/OT/SLP Co-Evaluation/Treatment: Yes Reason for Co-Treatment: Complexity of the patient's impairments (multi-system involvement);For patient/therapist safety;To  address functional/ADL transfers PT goals addressed during session: Mobility/safety with mobility        AM-PAC PT "6 Clicks" Mobility   Outcome Measure  Help needed turning from your back to your side while in a flat bed without using bedrails?: A Lot Help needed moving from lying on your back to sitting on the side of a flat bed without using bedrails?: A Lot Help needed moving to and from a bed to a chair (including a wheelchair)?: Total Help needed standing up from a chair using your arms (e.g., wheelchair or bedside chair)?: A Lot Help needed to walk in hospital room?: Total Help needed climbing 3-5 steps with a railing? : Total 6 Click Score: 9    End of Session Equipment Utilized During Treatment: Oxygen Activity Tolerance: Patient tolerated treatment well Patient left: in bed;with call bell/phone within reach;with bed alarm set;with family/visitor present(bed in chair position ) Nurse Communication: Mobility status PT Visit Diagnosis: Other abnormalities of gait and mobility (R26.89);Muscle weakness (generalized) (M62.81)     Time: 6073-7106 PT Time Calculation (min) (ACUTE ONLY): 36 min  Charges:  $Therapeutic Activity: 8-22 mins                     Earney Navy, PTA Acute Rehabilitation Services Pager: [phone] Office: [phone]     Darliss Cheney 05/15/2019, 5:04 PM

## 2019-05-15 NOTE — Progress Notes (Signed)
NAME:  Brendan [name], MRN:  [id number], DOB:  20-Jul-1955, LOS: 24 ADMISSION DATE:  04/06/2019, CONSULTATION DATE:  04/17/19 REFERRING MD:  Dr. Haroldine [name], CHIEF COMPLAINT:  Respiratory distress   Brief History   89 yoM originally presented with SOB and fatigue at [facility] found to have new HFrEF, NSTEMI, and bilateral pleural effusions transferred to [facility] on 3/29 for further cardiac evaluation. 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 Started on lasix and milrinone gtts however developed NSVT. Was taken 4/1 for placement of IABP and swan for optimization prior to CABG. Was on precedex for agitation/ confusion, some concern for DTs. On the evening of 4/1, patient developed worsening respiratory distress and hypoxia, PCCM consulted for intubation and vent management.   He was successfully extubated on 4/4 and PCCM signed off. Impella was removed 4/11 and patient noted to have increased WOB throughout the day he was placed on Bipap, but mental status continued to worsen and PCCM consulted for possible re-intubation  History of present illness   Mr. Brendan [name] is a 64 yo male withpoorly controlled diabetes complicated by neuropathy and hypertension who presented to an outside hospital on 3/26 with a chief complaint of general malaise and shortness of breath for the past 4 days. He is now transferred to [facility] for evaluation of troponin elevation, evaluation of newly depressed EF,and consideration for coronary angiography.  History obtained from patient report as well as discharge paperwork from outside hospital. Briefly, patient reportedly had nausea, vomiting, diarrhea approximately 1 week and is confined to his bed. He finally presented to the outside hospital ED he was found to have a blood sugar of 516, BUN of 53, creatinine 1.2, and anion gap of 17. Patient's BNP was elevated to 8900. Troponin was elevated to 13.1 with changes noted by the team of their lateral leads as well as V1 and V2.  Patient was started on aspirin and a heparin drip. Chest x-ray on admission was concerning for bilateral pulmonary opacities differential including infection and pulmonary edema. So he was started on empiric ceftriaxone and doxycycline, although his procalcitonin was negative. He had a chest wall echo that was performed that showed an EF of 25% was diuresed during his admission with improvement in his shortness of breath as well as radiographically. On discussion with patient he has been chest pain-free since his admission totheoutside hospital. In addition, he feels that his shortness of breath is much improved.the  Patient had left heart cath on 03/17/2019 showing severe three-vessel disease. 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 He then developed worsening cardiogenic shock and was intubated on 4/1 andplaced on a IABP on 4/02. He also had bilateral chest tubes placed with3 L removed from each side. He was then extubated on 4/4. On 4/6 patient had CABG/MVRperformed and was extubated on 4/7 with improved mentation with weaning of sedation and pressors. Patient had Impella removed on 4/9 and subsequent TEE showing an EF of 35%with trivial MR s/p MV repair. Remained on BiPAP at this time.On 4/10 nephrology was consulted due to anuric sinceCABG and started on CRRT.Developing worsening shortness of breath and increased work of breathing on BiPAP with hypoxia on ABG and subsequently reintubated on 4/12.  Patient was extubated on 4/14 and developed worsening hypoxia on BiPAP. Overnight he developed pulseless VT requiring 1 minute of CPR and epinephrine to achieve ROSC. Neurovascularly intact afterward. Subsequently had increased work of breathing and worsening respiratory distress requiring intubation.  Patient had tracheostomy performed on  05/01/2019. Trying to wean off ventilator.   Past Medical History  Tobacco abuse, HTN, poorly  controlled diabetes, diabetic neuropathy  Significant Hospital Events   3/30 Lt heart cath/TEE performed 4/1 Intubated, RHC/IABP/PA cath 4/2 Impella placement 4/3 febrile over evening and night. Blood cultures obtained. Vanc/cefepime started. Hematuria--heparin held. 4/4 Extubated,abx transitioned to rocephin. Heparin restarted 4/6 Re-intubated for CABG/MVR 4/7 Extubated from CABG to BiPAP 4/9 Impella removed  4/9 TEE 4/10 CVC inserted &CRRT initiated  4/12 PCCMre-consulted re-intubation 4/14 Extubated to BiPAP 4/15 Re-intubated due to respiratory distress 4/16 Tracheostomy 4/27 bronch with BAL  Consults:  TCTS PCCM HF Nephrology  Procedures:  4/15ET tube - 4/15 4/9 Cortrak> 4/6 Rt IJ> 4/10 HD cath- 4/13 4/12 foley cath> 3/30 PICC> 4/13 HD cath> 4/16 Tracheostomy>  Significant Diagnostic Tests:  3/30 R/ LHC >>  Ost LM to Mid LM lesion is 35% stenosed.  Prox LAD lesion is 90% stenosed.  Prox LAD to Mid LAD lesion is 50% stenosed.  Mid Cx lesion is 100% stenosed.  Prox RCA to Mid RCA lesion is 90% stenosed.  RPDA lesion is 95% stenosed.  LV end diastolic pressure is severely elevated.  Hemodynamic findings consistent with moderate pulmonary hypertension. 1. Severe 3 vessel obstructive CAD 2. High LV filling pressures 3. Reduced cardiac output with index 2.3 4. Moderate pulmonary HTN.  3/30TTE >> 1. Left ventricular ejection fraction, by estimation, is 30 to 35%. The left ventricle has moderately decreased function. The left ventricle demonstrates regional wall motion abnormalities.There is mild left ventricular hypertrophy. Left ventricular diastolic function could not be evaluated. There is severe hypokinesis of the left ventricular, entire inferior wall, inferoseptal wall, apical segment and lateral wall.  2. Right ventricular systolic function is hyperdynamic. The right ventricular size is normal. There is moderately elevated pulmonary artery  systolic pressure.  3. Decreased posterior leaflet motion due to ischemic tethering of the mitral valve leaflets.  4. The mitral valve is abnormal. Moderate to severe mitral valve regurgitation.  5. The aortic valve is tricuspid. Aortic valve regurgitation is not visualized. Mild aortic valve sclerosis is present, with no evidence of aortic valve stenosis.  6. The inferior vena cava is normal in size with greater than 50% respiratory variability, suggesting right atrial pressure of 3 mmHg.   4/1 CT chest w/o >>Large bilateral pleural effusions with associated atelectasis.Moderate to severe bilateral ground-glass opacities, likely reflecting pneumonia and/or edema.  4/1 RHC >> RA = 18 RV = 60/20  PA = 63/29 (42) PCW = 33 Fick cardiac output/index = 3.7/1.9 PVR = 2.5 WU FA sat = 98% PA sat = 47%, 49% PaPi = 2.2   Micro Data:  3/30 MRSA PCR >> neg 4/1 MRSA PCR >> neg 4/2 resp culture>>rare gram pos cocci, few candida albicans 4/3 Urine culture>>negative 4/4 blood cultures>>no growth  4/9 Surgical wound>> rare enterococcus faecalis 4/9 BCx2>>no gorwth 4/9 Urine Culture>> greater than 100k yeast 4/12 Resp >. Negative 4/16 BAL >> negative (AFB, fungal negative) 4/20 Resp >> rare yeast >>  4/20 blood >>  4/21 Resp >> rare yeast >>  4/26 trach aspirate>> few candida albicans 4/27 BAL fungus> yeast 4/27 BAL resp>> 4/27 BAL AFB>> 4/27 PJP smear>>   Antimicrobials:  PTA ceftriaxone/ doxycycline >> 3/29 Ceftriaxone 4/4-4/5 Cefepime 4/4>>4/11 vanc 4/4>>4/6 Rocephin 4/4>>4/5 Cefuroxime 4/6-4/7 Ampicillin 4/11 Vancomycin 4/12>>4/13 Cefepime 4/12>>4/13 Augmentin / unasyn 4/15> 4/20 Zosyn 4/20 >>  linezolid 4/20 >>    Interim history/subjective:  Had an episode of desaturation with rolling today and  had significant secretions suctioned from his ET tube.  He feels that his breathing is more labored today.  Objective   Blood pressure (!) 92/45, pulse (!) 58,  temperature 98.8 F (37.1 C), temperature source Oral, resp. rate (!) 8, height _0  (1.803 m), weight 52 kg, SpO2 98 %. CVP:  [2 mmHg-8 mmHg] 4 mmHg  Vent Mode: PRVC FiO2 (%):  [35 %-60 %] 60 % Set Rate:  [16 bmp] 16 bmp Vt Set:  [450 mL] 450 mL PEEP:  [5 cmH20] 5 cmH20 Plateau Pressure:  [16 cmH20] 16 cmH20   Intake/Output Summary (Last 24 hours) at 05/15/2019 1006 Last data filed at 05/15/2019 0900 Gross per 24 hour  Intake 1409.03 ml  Output 2712 ml  Net -1302.97 ml   Filed Weights   05/12/19 0415 05/14/19 0500 05/15/19 0500  Weight: 57.8 kg 53.4 kg 52 kg    Examination:    General appearance:  Frail cachectic man sitting up in bed in no acute distress Eyes:  Anicteric, extraocular motion grossly intact HENT:  [place]/AT, temporal wasting Neck: Trach in place Lungs:  Breathing comfortably on trach collar satting 100%.  No rhonchi, rales, or wheezing.  No accessory muscle use. CV:  Regular rate and rhythm, paced Abdomen:  Soft, minimally tender to palpation-unchanged.  No guarding. Extremities:  No clubbing or cyanosis.  Distal lower extremities wrapped with mild edema in feet neuro:  Awake and alert, answering questions appropriately, moving all extremities but globally weak.   Resolved Hospital Problem list     Assessment & Plan:  Acute hypoxic respiratory failure, multifactorial.  Pulmonary edema improved with more aggressive UF, but increased sputum production. Possibly candida pneumonia vs colonization.  -Continue trach collar trials daily with nocturnal ventilation. -Repeat trach aspirate today.  At risk for post- bronchoscopy pneumonia. -Continue fluconazole -Continue to follow BAL cultures -Xopenex as needed -VAP prevention protocol -CXR pending today  HFrEF (20-25%), cardiogenic shock, NSTEMI with three-vessel disease, NSVT Polymorphic VT -Ongoing management per primary and advanced heart failure service.  Remains on vasopressin, norepinephrine, midodrine.   -Maintain euvolemia -Off amiodarone per cardiology's recommendations -Previously on rosuvastatin, unsure why it was discontinued  Worsening leukocytosis -Continue to follow respiratory cultures from BAL -Repeat trach aspirate today -Consider discontinuing steroids and empirically starting antibiotics if febrile  Severe Malnutrition -Continue enteral nutrition  AKI, possibly ATN in the setting of cardiogenic shock, anuric  -Continue CRRT per nephrology  Diabetes mellitus with hyperglycemia worsened with steroids -Insulin dosing per pharmacy  Elevated transaminase enzymes with cholestasis pattern, concern for congestive hepatopathy. Now on azole. -Will con't to monitor.  Best practice:  Diet: Tube feeds Pain/Anxiety/Delirium protocol (if indicated):Fentanyl VAP protocol (if indicated):HOB 30 degrees,  DVT prophylaxis: heparin  GI prophylaxis: PPI Glucose control:SSI Mobility: BR Code Status: Full  Family Communication:per primary team; wife updated at bedside during exam Disposition:ICU  Labs   CBC: Recent Labs  Lab 05/11/19 2325 05/11/19 2325 05/12/19 0232 05/12/19 0330 05/13/19 0555 05/13/19 0555 05/13/19 1029 05/14/19 0325 05/14/19 0505 05/15/19 0302 05/15/19 0443  WBC 17.0*  --  17.8*  --  16.3*  --   --   --  11.7* 14.6*  --   HGB 10.3*   < > 10.3*   < > 10.4*   < > 11.6* 11.6* 10.4* 10.1* 11.2*  HCT 33.3*   < > 34.1*   < > 33.9*   < > 34.0* 34.0* 34.1* 33.6* 33.0*  MCV 96.2  --  96.9  --  97.7  --   --   --  98.8 98.8  --   PLT 276  --  303  --  253  --   --   --  241 261  --    < > = values in this interval not displayed.    Basic Metabolic Panel: Recent Labs  Lab 05/11/19 0328 05/11/19 1502 05/12/19 0232 05/12/19 0330 05/13/19 0555 05/13/19 1029 05/13/19 1600 05/13/19 1600 05/14/19 0325 05/14/19 0505 05/14/19 1625 05/15/19 0302 05/15/19 0443  NA 136  135   < > 134*   < > 133*   < > 133*   < > 135 133* 134* 135 133*  K 4.4   4.4   < > 4.1   < > 4.1   < > 4.7   < > 4.9 5.2* 4.7 5.0 5.1  CL 104  103   < > 101   < > 100  --  100  --   --  100 102 101  --   CO2 27  26   < > 25   < > 27  --  25  --   --  21* 26 27  --   GLUCOSE 127*  128*   < > 199*   < > 239*  --  171*  --   --  223* 182* 226*  --   BUN 34*  34*   < > 30*   < > 32*  --  32*  --   --  33* 35* 34*  --   CREATININE 1.03  1.04   < > 0.93   < > 0.88  --  0.87  --   --  0.92 0.93 0.91  --   CALCIUM 7.8*  7.7*   < > 7.7*   < > 7.9*  --  8.3*  --   --  8.5* 8.2* 8.4*  --   MG 2.6*  --  2.5*  --  2.5*  --   --   --   --  2.7*  --  2.7*  --   PHOS 2.9   < > 2.9   < > 2.8  --  3.3  --   --  3.6 3.2 3.4  --    < > = values in this interval not displayed.   GFR: Estimated Creatinine Clearance: 62.7 mL/min (by C-G formula based on SCr of 0.91 mg/dL). Recent Labs  Lab 05/09/19 0343 05/10/19 0329 05/12/19 0232 05/13/19 0555 05/14/19 0505 05/15/19 0302  PROCALCITON 1.69  --   --   --   --   --   WBC 15.8*   < > 17.8* 16.3* 11.7* 14.6*   < > = values in this interval not displayed.    Liver Function Tests: Recent Labs  Lab 05/11/19 0328 05/11/19 1502 05/12/19 0232 05/12/19 1600 05/13/19 0555 05/13/19 1600 05/14/19 0505 05/14/19 1625 05/15/19 0302  AST 46*  --  43*  --  53*  --  34  --  44*  ALT 79*  --  66*  --  64*  --  54*  --  56*  ALKPHOS 531*  --  504*  --  525*  --  482*  --  469*  BILITOT 0.9  --  1.1  --  0.8  --  1.0  --  0.8  PROT 6.0*  --  6.4*  --  6.6  --  7.2  --  7.0  ALBUMIN 1.7*  1.6*   < > 1.7*   < > 1.7*  1.7* 2.6* 2.3*  2.4* 2.1* 2.2*  2.2*   < > = values in this interval not displayed.   No results for input(s): LIPASE, AMYLASE in the last 168 hours. No results for input(s): AMMONIA in the last 168 hours.  ABG    Component Value Date/Time   PHART 7.369 05/15/2019 0443   PCO2ART 47.1 05/15/2019 0443   PO2ART 69 (L) 05/15/2019 0443   HCO3 27.3 05/15/2019 0443   TCO2 29 05/15/2019 0443   ACIDBASEDEF 3.0 (H)  04/28/2019 0735   O2SAT 93.0 05/15/2019 0443     Coagulation Profile: No results for input(s): INR, PROTIME in the last 168 hours.  Cardiac Enzymes: No results for input(s): CKTOTAL, CKMB, CKMBINDEX, TROPONINI in the last 168 hours.  HbA1C: HbA1c, POC (controlled diabetic range)  Date/Time Value Ref Range Status  01/14/2019 02:02 PM 12.5 (A) 0.0 - 7.0 % Final  08/21/2017 02:03 PM 8.5 (A) 0.0 - 7.0 % Final   Hgb A1c MFr Bld  Date/Time Value Ref Range Status  04/08/2019 09:49 PM 9.0 (H) 4.8 - 5.6 % Final    Comment:    (NOTE) Pre diabetes:          5.7%-6.4% Diabetes:              >6.4% Glycemic control for   <7.0% adults with diabetes     CBG: Recent Labs  Lab 05/14/19 1531 05/14/19 1924 05/14/19 2346 05/15/19 0340 05/15/19 0754  GLUCAP 136* 203* 120* 215* [place] Nasiir Monts, DO 05/15/19 11:14 AM [facility]

## 2019-05-15 NOTE — Progress Notes (Signed)
Patient ID: Brendan [name], male   DOB: 1955-09-09, 64 y.o.   MRN: [id number]     Advanced Heart Failure Rounding Note  PCP-Cardiologist: No primary care provider on file.   Subjective:    Events - Presented to [facility] with acute HF  EF 20-25% - Transferred [facility] - Cath 3/30 with severe 3 vessel disease. EF 25% - Developed PMVT on milrinone -> milrinone stopped -> developed worsening shock -> IABP placed on 4/1 - Clinical deterioration 4/1-> intubated - 4/2 underwent Impella 5.5 placement earlier and bilateral chest tubes with 3L out from each side.  - Extubated 4/4 - 04/20/2019 CABG and MVR - Extubated 4/7 - Back to OR for Impella 5.5 extraction 4/9, extubated.  TEE with EF 35%, RV ok, trivial MR s/p MV repair.  - CVVH started 4/10 with oliguria, rising creatinine and CVP.  - Deteriorating respiratory status, intubated again early am 4/12.  Enterococcus faecalis in sputum.  - Extubated 4/14.  Developed respiratory distress and started on Bipap.  Had VT arrest with CPR/epinephrine, intubated again 4/15 early am.  - Tracheostomy 4/16 - Atrial fibrillation early am 4/17 -Echo 4/22: EF 30-35% RV mildly reduced. MVR looks good. - S/P Bronchoscopy 4/27    Remains on CVVHD. Milrinone off. NE weaned down to 2. VP at 0.02. able to tolerate TC during day. Rests on vent at night. Co-ox low at 51%. Still with some generalized pain (chronic)  Objective:   Weight Range: 52 kg Body mass index is 15.99 kg/m.   Vital Signs:   Temp:  [97.6 F (36.4 C)-98.8 F (37.1 C)] 98.8 F (37.1 C) (04/29 0000) Pulse Rate:  [50-67] 57 (04/29 0900) Resp:  [8-24] 8 (04/29 0900) BP: (80-107)/(48-77) 91/51 (04/29 0900) SpO2:  [82 %-100 %] 100 % (04/29 0900) FiO2 (%):  [35 %-60 %] 60 % (04/29 0838) Weight:  [52 kg] 52 kg (04/29 0500) Last BM Date: 05/14/19  Weight change: Filed Weights   05/12/19 0415 05/14/19 0500 05/15/19 0500  Weight: 57.8 kg 53.4 kg 52 kg    Intake/Output:   Intake/Output  Summary (Last 24 hours) at 05/15/2019 0909 Last data filed at 05/15/2019 0900 Gross per 24 hour  Intake 1473.4 ml  Output 2821 ml  Net -1347.6 ml      Physical Exam   General: Cachetic chronically ill appearing. Trach/Vent HEENT: normal Neck: supple.+ trach Carotids 2+ bilat; no bruits. No lymphadenopathy or thryomegaly appreciated. Cor: PMI nondisplaced. Regular rate & rhythm. No rubs, gallops or murmurs. L [place] trialysis Lungs: coarse Abdomen: soft, nontender, nondistended. No hepatosplenomegaly. No bruits or masses. Good bowel sounds. Extremities: no cyanosis, clubbing, rash, edema Neuro: alert & orientedx3, cranial nerves grossly intact. moves all 4 extremities w/o difficulty. Affect pleasant  Telemetry   SR 50-60s Personally reviewed   Labs    CBC Recent Labs    05/14/19 0505 05/14/19 0505 05/15/19 0302 05/15/19 0443  WBC 11.7*  --  14.6*  --   HGB 10.4*   < > 10.1* 11.2*  HCT 34.1*   < > 33.6* 33.0*  MCV 98.8  --  98.8  --   PLT 241  --  261  --    < > = values in this interval not displayed.   Basic Metabolic Panel Recent Labs    05/14/19 0505 05/14/19 0505 05/14/19 1625 05/14/19 1625 05/15/19 0302 05/15/19 0443  NA 133*   < > 134*   < > 135 133*  K 5.2*   < >  4.7   < > 5.0 5.1  CL 100   < > 102  --  101  --   CO2 21*   < > 26  --  27  --   GLUCOSE 223*   < > 182*  --  226*  --   BUN 33*   < > 35*  --  34*  --   CREATININE 0.92   < > 0.93  --  0.91  --   CALCIUM 8.5*   < > 8.2*  --  8.4*  --   MG 2.7*  --   --   --  2.7*  --   PHOS 3.6   < > 3.2  --  3.4  --    < > = values in this interval not displayed.   Liver Function Tests Recent Labs    05/14/19 0505 05/14/19 0505 05/14/19 1625 05/15/19 0302  AST 34  --   --  44*  ALT 54*  --   --  56*  ALKPHOS 482*  --   --  469*  BILITOT 1.0  --   --  0.8  PROT 7.2  --   --  7.0  ALBUMIN 2.3*  2.4*   < > 2.1* 2.2*  2.2*   < > = values in this interval not displayed.   No results for input(s):  LIPASE, AMYLASE in the last 72 hours. Cardiac Enzymes No results for input(s): CKTOTAL, CKMB, CKMBINDEX, TROPONINI in the last 72 hours.  BNP: BNP (last 3 results) Recent Labs    04/13/2019 0113  BNP 655.7*    ProBNP (last 3 results) No results for input(s): PROBNP in the last 8760 hours.   D-Dimer No results for input(s): DDIMER in the last 72 hours. Hemoglobin A1C No results for input(s): HGBA1C in the last 72 hours. Fasting Lipid Panel No results for input(s): CHOL, HDL, LDLCALC, TRIG, CHOLHDL, LDLDIRECT in the last 72 hours. Thyroid Function Tests No results for input(s): TSH, T4TOTAL, T3FREE, THYROIDAB in the last 72 hours.  Invalid input(s): FREET3  Other results:   Imaging    No results found.   Medications:     Scheduled Medications: . aspirin EC  325 mg Oral Daily   Or  . aspirin  324 mg Per Tube Daily  . B-complex with vitamin C  1 tablet Per Tube Daily  . chlorhexidine gluconate (MEDLINE KIT)  15 mL Mouth Rinse BID  . Chlorhexidine Gluconate Cloth  6 each Topical Daily  . docusate  200 mg Per Tube Daily  . feeding supplement (PRO-STAT SUGAR FREE 64)  30 mL Oral TID  . fluconazole  200 mg Per Tube Daily  . gabapentin  300 mg Per Tube Q8H  . insulin aspart  0-24 Units Subcutaneous Q4H  . insulin aspart  2 Units Subcutaneous Q4H  . insulin glargine  14 Units Subcutaneous BID  . mouth rinse  15 mL Mouth Rinse 10 times per day  . metoCLOPramide (REGLAN) injection  10 mg Intravenous Q8H  . midodrine  15 mg Per Tube Q8H  . neomycin-polymyxin-hydrocortisone  3 drop Left EAR Q12H  . oxyCODONE  10 mg Per Tube Q6H  . pantoprazole sodium  40 mg Per Tube BID  . pneumococcal 23 valent vaccine  0.5 mL Intramuscular Tomorrow-1000  . sodium chloride flush  10-40 mL Intracatheter Q12H    Infusions: .  prismasol BGK 4/2.5 400 mL/hr at 05/15/19 0132  .  prismasol BGK 4/2.5 200  mL/hr at 05/15/19 0132  . epinephrine Stopped (04/28/19 0138)  . feeding supplement  (VITAL 1.5 CAL) 55 mL/hr at 05/14/19 1900  . heparin 10,000 units/ 20 mL infusion syringe 1,000 Units/hr (05/15/19 0624)  . lactated ringers Stopped (05/12/2019 1640)  . lactated ringers Stopped (04/30/19 0930)  . norepinephrine (LEVOPHED) Adult infusion 2 mcg/min (05/15/19 0800)  . prismasol BGK 4/2.5 1,500 mL/hr at 05/15/19 0839  . vasopressin (PITRESSIN) infusion - *FOR SHOCK* 0.02 Units/min (05/15/19 0800)    PRN Medications: alteplase, Gerhardt's butt cream, heparin, levalbuterol, ondansetron (ZOFRAN) IV, ondansetron (ZOFRAN) IV, oxyCODONE, sodium chloride, sodium chloride flush     Assessment/Plan    1. Acute systolic HF ->  Cardiogenic Shock  - Due to iCM. EF 20-25% - Impella 5.5 placed on 4/2.   - s/p CABG/MVRepair on 4/6  - Impella out 4/9.  - Echo 4/10 with EF 25-30%, normal RV, no MR.  - Echo 4/22 EF 30-35% mildly reduced RV MVR ok   - Remains on CVVH.   - Still/pressor/inotrope dependent. Milrinone off. NE down to 2 and on vasopressin 0.02  -  Co-ox now low at 52%.  -  On midodrine 15 tid.  - Wean VP as tolerated. Follow co-ox closely.  - Volume status looks good. Dont think we can pull much more. Keep even on CRT.   2. CAD - LHC with severe 3 vessel CAD  - No s/s angina - s/p CABG x 3 MV Repair 4/6. Echo stable - on ASA/Crestor  3. Acute hypoxic respiratory failure - Due to pulmonary edema and PNA - Extubated 4/9 re-intubated on 4/12. Failed re-extubation on 4/14. Now s/p tracheostomy -Completed antibiotic course.  - S/P Bronch 4/27 - 60K yeast. -> ? Colonization.  - Remains vented through trach.  - respiratory status improving with volume removal. Now back on TCT during day. Resting on vent at night. CCM following - May need LTAC  4. Mitral Regurgitation - Mod-severe on ECHO - s/p MV repair, TEE 4/9 with minimal MR s/p repair.   - Echo 4/10 with no significant MR.  - echo 4/22 MVR stable  5. Uncontrolled DM - Hgb A1C 9.  - On insulin and sliding  scale.  - No change  6. AKI - due to shock - Became anuric and now on CVVH to control volume overload.   - Remains anuric. Volume status looks good. Keep even. Switch to iHD as BP tolerates. (not ready yet) - Nephrology following.   7. Acute blood loss Anemia  - Hgb 10.1 -  No obvious bleeding.  - transfuse hgb < 7.5   8. Polymorphic VT - Recurrent VT during respiratory distress 4/14, - Off amio  - No further VT.  - Keep K> 4.0 Mg > 2.0  - no change  9. 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 No sensation R and L foot and occasionaly in his hands. Requires assistance with ADLs.   10. Severe Malnutrition - Prealbumin 8.9  - Nutrition on board - Continue w/ TFs  11. Elevated LFTs - Suspect shock liver - AST/ALT remain stably elevated.  12. Atrial fibrillation. paroxysmal -  Remains in NSR.  - Amiodarone was stopped.   58. Debility, severe - will need SNF on d/c  14. Chronic pain issues -4/27  Pain was not controlled. Using IV fentanyl  ~14 x a day. Switched to oxycontin 10 mg q 6 hours. Improved today   CRITICAL CARE Performed by: Glori Bickers  Total critical care  time: 35 minutes  Critical care time was exclusive of separately billable procedures and treating other patients.  Critical care was necessary to treat or prevent imminent or life-threatening deterioration.  Critical care was time spent personally by me (independent of midlevel providers or residents) on the following activities: development of treatment plan with patient and/or surrogate as well as nursing, discussions with consultants, evaluation of patient's response to treatment, examination of patient, obtaining history from patient or surrogate, ordering and performing treatments and interventions, ordering and review of laboratory studies, ordering and review of radiographic studies, pulse oximetry and re-evaluation of patient's condition.   Glori Bickers, MD  9:09  AM

## 2019-05-15 NOTE — Progress Notes (Signed)
Inpatient Diabetes Program Recommendations  AACE/ADA: New Consensus Statement on Inpatient Glycemic Control (2015)  Target Ranges:  Prepandial:   less than 140 mg/dL      Peak postprandial:   less than 180 mg/dL (1-2 hours)      Critically ill patients:  140 - 180 mg/dL   Lab Results  Component Value Date   GLUCAP 181 (H) 05/15/2019   HGBA1C 9.0 (H) 04/11/2019    Review of Glycemic Control Results for Brendan [name], Brendan [name] (MRN [id number]) as of 05/15/2019 12:32  Ref. Range 05/14/2019 19:24 05/14/2019 23:46 05/15/2019 03:40 05/15/2019 07:54 05/15/2019 11:39  Glucose-Capillary Latest Ref Range: 70 - 99 mg/dL 203 (H) 120 (H) 215 (H) 207 (H) 181 (H)   Diabetes history: DM 2 Outpatient Diabetes medications:  Basaglar 28 units q HS, Humalog 8 units bid, Metformin 500 mg bid Current orders for Inpatient glycemic control:  Lantus 14 units bid Novolog 0-24 units q 4 hours Novolog 2 units q 4 hours  Inpatient Diabetes Program Recommendations:    Please consider increasing Lantus to 16 units bid.   Thanks,  Adah Perl, RN, BC-ADM Inpatient Diabetes Coordinator Pager [phone] (8a-5p)

## 2019-05-16 ENCOUNTER — Inpatient Hospital Stay (HOSPITAL_COMMUNITY): Payer: Medicaid Other

## 2019-05-16 DIAGNOSIS — R5381 Other malaise: Secondary | ICD-10-CM

## 2019-05-16 LAB — MAGNESIUM: Magnesium: 2.8 mg/dL — ABNORMAL HIGH (ref 1.7–2.4)

## 2019-05-16 LAB — COOXEMETRY PANEL
Carboxyhemoglobin: 1.5 % (ref 0.5–1.5)
Methemoglobin: 0.8 % (ref 0.0–1.5)
O2 Saturation: 68.5 %
Total hemoglobin: 10.7 g/dL — ABNORMAL LOW (ref 12.0–16.0)

## 2019-05-16 LAB — CBC
HCT: 35.1 % — ABNORMAL LOW (ref 39.0–52.0)
Hemoglobin: 10.7 g/dL — ABNORMAL LOW (ref 13.0–17.0)
MCH: 30.8 pg (ref 26.0–34.0)
MCHC: 30.5 g/dL (ref 30.0–36.0)
MCV: 101.2 fL — ABNORMAL HIGH (ref 80.0–100.0)
Platelets: 272 10*3/uL (ref 150–400)
RBC: 3.47 MIL/uL — ABNORMAL LOW (ref 4.22–5.81)
RDW: 19.1 % — ABNORMAL HIGH (ref 11.5–15.5)
WBC: 15.4 10*3/uL — ABNORMAL HIGH (ref 4.0–10.5)
nRBC: 0 % (ref 0.0–0.2)

## 2019-05-16 LAB — GLUCOSE, CAPILLARY
Glucose-Capillary: 127 mg/dL — ABNORMAL HIGH (ref 70–99)
Glucose-Capillary: 148 mg/dL — ABNORMAL HIGH (ref 70–99)
Glucose-Capillary: 138 mg/dL — ABNORMAL HIGH (ref 70–99)
Glucose-Capillary: 128 mg/dL — ABNORMAL HIGH (ref 70–99)
Glucose-Capillary: 156 mg/dL — ABNORMAL HIGH (ref 70–99)

## 2019-05-16 LAB — RENAL FUNCTION PANEL
Albumin: 2 g/dL — ABNORMAL LOW (ref 3.5–5.0)
Anion gap: 9 (ref 5–15)
BUN: 69 mg/dL — ABNORMAL HIGH (ref 8–23)
CO2: 24 mmol/L (ref 22–32)
Calcium: 8.4 mg/dL — ABNORMAL LOW (ref 8.9–10.3)
Chloride: 101 mmol/L (ref 98–111)
Creatinine, Ser: 1.88 mg/dL — ABNORMAL HIGH (ref 0.61–1.24)
GFR calc Af Amer: 44 mL/min — ABNORMAL LOW (ref >60)
GFR calc non Af Amer: 38 mL/min — ABNORMAL LOW (ref >60)
Glucose, Bld: 153 mg/dL — ABNORMAL HIGH (ref 70–99)
Phosphorus: 4.8 mg/dL — ABNORMAL HIGH (ref 2.5–4.6)
Potassium: 4.4 mmol/L (ref 3.5–5.1)
Sodium: 134 mmol/L — ABNORMAL LOW (ref 135–145)

## 2019-05-16 MED ORDER — METOCLOPRAMIDE HCL 5 MG/ML IJ SOLN
5.0000 mg | Freq: Three times a day (TID) | INTRAMUSCULAR | Status: DC
  Administered 2019-05-16 – 2019-05-23 (×22): 5 mg via INTRAVENOUS
  Filled 2019-05-16 (×22): qty 2

## 2019-05-16 NOTE — Progress Notes (Addendum)
Patient's lower legs wrapped with compression wraps, not Unna Boots.  Compression wraps removed to allow for skin assessment.   Will discuss ongoing need for compression wraps with MD.   1100 - Spoke with MD Bensimhon, MD advised to keep compression wraps off and to place TED hose.

## 2019-05-16 NOTE — Progress Notes (Addendum)
[facility] Progress Note:   50M dialysis dependent AKI; cardiogenic shock status post CABG + MV replacement 4/2  S: Pt anuric. He had 478 mL UF over 4/29 with CRRT (was keep even then stopped late AM/early afternoon).  Levo is at 4 mcg/min and he is off of vasopressin.  Has been anuric.   Review of systems:    Denies air hunger Generalized pain is present but better  Denies n/v    O: 04/29 0701 - 04/30 0700 In: 727.9 [I.V.:122.9; NG/GT:605] Out: 478   Filed Weights   05/14/19 0500 05/15/19 0500 05/16/19 0457  Weight: 53.4 kg 52 kg 53.1 kg    Physical exam:   General adult male in bed critically ill  HEENT normocephalic atraumatic extraocular movements intact sclera anicteric Lungs coarse mechanical breath sounds   Heart S1S2 on levo  Abdomen soft nontender nondistended Extremities 1-2+ pedal edema  Neuro - awake and interactive follows commands Psych no anxiety or agitation Access: left subclavian temporary HD catheter   Recent Labs  Lab 05/14/19 1625 05/14/19 1625 05/15/19 0302 05/15/19 0443 05/16/19 0356  NA 134*   < > 135 133* 134*  K 4.7   < > 5.0 5.1 4.4  CL 102  --  101  --  101  CO2 26  --  27  --  24  GLUCOSE 182*  --  226*  --  153*  BUN 35*  --  34*  --  69*  CREATININE 0.93  --  0.91  --  1.88*  CALCIUM 8.2*  --  8.4*  --  8.4*  PHOS 3.2  --  3.4  --  4.8*   < > = values in this interval not displayed.   Recent Labs  Lab 05/14/19 0505 05/14/19 0505 05/15/19 0302 05/15/19 0443 05/16/19 0356  WBC 11.7*  --  14.6*  --  15.4*  HGB 10.4*   < > 10.1* 11.2* 10.7*  HCT 34.1*   < > 33.6* 33.0* 35.1*  MCV 98.8  --  98.8  --  101.2*  PLT 241  --  261  --  272   < > = values in this interval not displayed.    Scheduled Meds: . aspirin EC  325 mg Oral Daily   Or  . aspirin  324 mg Per Tube Daily  . B-complex with vitamin C  1 tablet Per Tube Daily  . chlorhexidine gluconate (MEDLINE KIT)  15 mL Mouth Rinse BID  . Chlorhexidine Gluconate  Cloth  6 each Topical Daily  . docusate  200 mg Per Tube Daily  . feeding supplement (PRO-STAT SUGAR FREE 64)  30 mL Oral TID  . fluconazole  200 mg Per Tube Daily  . gabapentin  300 mg Per Tube Q8H  . insulin aspart  0-24 Units Subcutaneous Q4H  . insulin aspart  2 Units Subcutaneous Q4H  . insulin glargine  14 Units Subcutaneous BID  . mouth rinse  15 mL Mouth Rinse 10 times per day  . metoCLOPramide (REGLAN) injection  10 mg Intravenous Q8H  . midodrine  15 mg Per Tube Q8H  . neomycin-polymyxin-hydrocortisone  3 drop Left EAR Q12H  . oxyCODONE  10 mg Per Tube Q6H  . pantoprazole sodium  40 mg Per Tube BID  . pneumococcal 23 valent vaccine  0.5 mL Intramuscular Tomorrow-1000  . sodium chloride flush  10-40 mL Intracatheter Q12H   Continuous Infusions: . epinephrine Stopped (04/28/19 0138)  . feeding supplement (VITAL 1.5  CAL) 55 mL/hr at 05/15/19 2200  . heparin 10,000 units/ 20 mL infusion syringe 1,000 Units/hr (05/15/19 0624)  . lactated ringers Stopped (04/28/2019 1640)  . lactated ringers Stopped (04/30/19 0930)  . norepinephrine (LEVOPHED) Adult infusion 4 mcg/min (05/16/19 0300)  . vasopressin (PITRESSIN) infusion - *FOR SHOCK* Stopped (05/15/19 1725)   PRN Meds:.Gerhardt's butt cream, heparin, levalbuterol, ondansetron (ZOFRAN) IV, ondansetron (ZOFRAN) IV, oxyCODONE, sodium chloride flush  ABG    Component Value Date/Time   PHART 7.369 05/15/2019 0443   PCO2ART 47.1 05/15/2019 0443   PO2ART 69 (L) 05/15/2019 0443   HCO3 27.3 05/15/2019 0443   TCO2 29 05/15/2019 0443   ACIDBASEDEF 3.0 (H) 04/28/2019 0735   O2SAT 93.0 05/15/2019 0443    A/P  1. Dialysis dependent AKI, anuric.  CRRT 4/10 - 4/29.  Left subclavian temp HD catheter on 4/13 with CTS.  1. Follow volume status and clearance closely daily for renal replacement therapy needs.  Goal to transition to HD soon as needed.  Anticipate gentle HD on 5/1 for clearance if stable for same. Will also reassess for need to  resume CRRT 2. Please reduce gabapentin to 300 mg daily given his AKI  2. Hypophosphatemia, from CRRT - resolved  3. Enterococcus faecalis pneumonia; per primary and CCM 4. Acute hypoxic respiratory failure s/p trach 4/16 5. Cardiogenic shock on pressors and midodrine per primary   6. CAD status post CABG 7. Mitral regurgitation status post mitral valve repair 8. diabetes mellitus 2 - per primary  9. Acute blood loss anemia - stable   10. Recurrent polymorphic ventricular tachycardia, s/p amiodarone    Claudia Desanctis 05/16/2019  5:45 AM

## 2019-05-16 NOTE — Progress Notes (Signed)
Patient ID: Brendan [name], male   DOB: 1956-01-11, 64 y.o.   MRN: [id number]     Advanced Heart Failure Rounding Note  PCP-Cardiologist: No primary care provider on file.   Subjective:    Events - Presented to [facility] with acute HF  EF 20-25% - Transferred [facility] - Cath 3/30 with severe 3 vessel disease. EF 25% - Developed PMVT on milrinone -> milrinone stopped -> developed worsening shock -> IABP placed on 4/1 - Clinical deterioration 4/1-> intubated - 4/2 underwent Impella 5.5 placement earlier and bilateral chest tubes with 3L out from each side.  - Extubated 4/4 - 05/01/2019 CABG and MVR - Extubated 4/7 - Back to OR for Impella 5.5 extraction 4/9, extubated.  TEE with EF 35%, RV ok, trivial MR s/p MV repair.  - CVVH started 4/10 with oliguria, rising creatinine and CVP.  - Deteriorating respiratory status, intubated again early am 4/12.  Enterococcus faecalis in sputum.  - Extubated 4/14.  Developed respiratory distress and started on Bipap.  Had VT arrest with CPR/epinephrine, intubated again 4/15 early am.  - Tracheostomy 4/16 - Atrial fibrillation early am 4/17 -Echo 4/22: EF 30-35% RV mildly reduced. MVR looks good. - S/P Bronchoscopy 4/27    CVVHD stopped yesterday. Volume status looks good. Remains anuric. On NE 4. VP and milrinone off. Co-ox 69%. Was on vent overnight now back on TC.   CXR with bilateral infiltrates (improved) Personally reviewed    Objective:   Weight Range: 53.1 kg Body mass index is 16.33 kg/m.   Vital Signs:   Temp:  [96.8 F (36 C)-98.3 F (36.8 C)] 97.3 F (36.3 C) (04/30 0743) Pulse Rate:  [36-92] 70 (04/30 0845) Resp:  [7-19] 9 (04/30 0845) BP: (78-110)/(35-72) 103/58 (04/30 0845) SpO2:  [90 %-100 %] 98 % (04/30 0845) FiO2 (%):  [35 %-40 %] 35 % (04/30 0800) Weight:  [53.1 kg] 53.1 kg (04/30 0457) Last BM Date: 05/15/19  Weight change: Filed Weights   05/14/19 0500 05/15/19 0500 05/16/19 0457  Weight: 53.4 kg 52 kg 53.1 kg     Intake/Output:   Intake/Output Summary (Last 24 hours) at 05/16/2019 0905 Last data filed at 05/16/2019 0900 Gross per 24 hour  Intake 1445.65 ml  Output 1046 ml  Net 399.65 ml      Physical Exam   General: Cachetic chronically ill appearing. Trach/Vent HEENT: normal Neck: supple. no JVD. +trach Carotids 2+ bilat; no bruits. No lymphadenopathy or thryomegaly appreciated. Cor: PMI nondisplaced. Regular rate & rhythm. No rubs, gallops or murmurs. L Evangeline trialysis cath Lungs: coarse Abdomen: soft, nontender, nondistended. No hepatosplenomegaly. No bruits or masses. Good bowel sounds. Extremities: no cyanosis, clubbing, rash, edema Neuro: alert & orientedx3, cranial nerves grossly intact. moves all 4 extremities w/o difficulty. Affect pleasant  Telemetry    SR 60-70s Personally reviewed   Labs    CBC Recent Labs    05/15/19 0302 05/15/19 0302 05/15/19 0443 05/16/19 0356  WBC 14.6*  --   --  15.4*  HGB 10.1*   < > 11.2* 10.7*  HCT 33.6*   < > 33.0* 35.1*  MCV 98.8  --   --  101.2*  PLT 261  --   --  272   < > = values in this interval not displayed.   Basic Metabolic Panel Recent Labs    05/15/19 0302 05/15/19 0302 05/15/19 0443 05/16/19 0356  NA 135   < > 133* 134*  K 5.0   < > 5.1  4.4  CL 101  --   --  101  CO2 27  --   --  24  GLUCOSE 226*  --   --  153*  BUN 34*  --   --  69*  CREATININE 0.91  --   --  1.88*  CALCIUM 8.4*  --   --  8.4*  MG 2.7*  --   --  2.8*  PHOS 3.4  --   --  4.8*   < > = values in this interval not displayed.   Liver Function Tests Recent Labs    05/14/19 0505 05/14/19 1625 05/15/19 0302 05/16/19 0356  AST 34  --  44*  --   ALT 54*  --  56*  --   ALKPHOS 482*  --  469*  --   BILITOT 1.0  --  0.8  --   PROT 7.2  --  7.0  --   ALBUMIN 2.3*  2.4*   < > 2.2*  2.2* 2.0*   < > = values in this interval not displayed.   No results for input(s): LIPASE, AMYLASE in the last 72 hours. Cardiac Enzymes No results for input(s):  CKTOTAL, CKMB, CKMBINDEX, TROPONINI in the last 72 hours.  BNP: BNP (last 3 results) Recent Labs    03/21/2019 0113  BNP 655.7*    ProBNP (last 3 results) No results for input(s): PROBNP in the last 8760 hours.   D-Dimer No results for input(s): DDIMER in the last 72 hours. Hemoglobin A1C No results for input(s): HGBA1C in the last 72 hours. Fasting Lipid Panel No results for input(s): CHOL, HDL, LDLCALC, TRIG, CHOLHDL, LDLDIRECT in the last 72 hours. Thyroid Function Tests No results for input(s): TSH, T4TOTAL, T3FREE, THYROIDAB in the last 72 hours.  Invalid input(s): FREET3  Other results:   Imaging    DG CHEST PORT 1 VIEW  Result Date: 05/16/2019 CLINICAL DATA:  Respiratory distress EXAM: PORTABLE CHEST 1 VIEW COMPARISON:  May 14, 2019 and multiple recent prior studies FINDINGS: Tracheostomy tip is 5.6 cm above the carina. Left central catheter tip is in the superior vena cava. Right central catheter tip is at the cavoatrial junction. Feeding tube tip and side port are below the diaphragm. No pneumothorax. There is widespread alveolar opacity with suspected degree of ARDS. There may be a degree of pulmonary edema and/or pneumonia superimposed. Heart is upper normal in size with pulmonary vascularity normal. Postoperative changes are stable. There is aortic atherosclerosis. No bone lesions. IMPRESSION: Tube and catheter positions as described without pneumothorax. Suspect underlying ARDS. A degree of edema and/or pneumonia cannot be excluded in this circumstance. Stable cardiac silhouette and postoperative changes. Aortic Atherosclerosis (ICD10-I70.0). Electronically Signed   By: Lowella Grip III M.D.   On: 05/16/2019 09:00     Medications:     Scheduled Medications: . aspirin EC  325 mg Oral Daily   Or  . aspirin  324 mg Per Tube Daily  . B-complex with vitamin C  1 tablet Per Tube Daily  . chlorhexidine gluconate (MEDLINE KIT)  15 mL Mouth Rinse BID  .  Chlorhexidine Gluconate Cloth  6 each Topical Daily  . docusate  200 mg Per Tube Daily  . feeding supplement (PRO-STAT SUGAR FREE 64)  30 mL Oral TID  . fluconazole  200 mg Per Tube Daily  . gabapentin  300 mg Per Tube Q8H  . insulin aspart  0-24 Units Subcutaneous Q4H  . insulin aspart  2 Units Subcutaneous  Q4H  . insulin glargine  14 Units Subcutaneous BID  . mouth rinse  15 mL Mouth Rinse 10 times per day  . metoCLOPramide (REGLAN) injection  10 mg Intravenous Q8H  . midodrine  15 mg Per Tube Q8H  . neomycin-polymyxin-hydrocortisone  3 drop Left EAR Q12H  . oxyCODONE  10 mg Per Tube Q6H  . pantoprazole sodium  40 mg Per Tube BID  . pneumococcal 23 valent vaccine  0.5 mL Intramuscular Tomorrow-1000  . sodium chloride flush  10-40 mL Intracatheter Q12H    Infusions: . epinephrine Stopped (04/28/19 0138)  . feeding supplement (VITAL 1.5 CAL) 55 mL/hr at 05/16/19 0600  . heparin 10,000 units/ 20 mL infusion syringe 1,000 Units/hr (05/15/19 0624)  . lactated ringers Stopped (05/16/2019 1640)  . lactated ringers Stopped (04/30/19 0930)  . norepinephrine (LEVOPHED) Adult infusion 4 mcg/min (05/16/19 0900)  . vasopressin (PITRESSIN) infusion - *FOR SHOCK* Stopped (05/15/19 1725)    PRN Medications: Gerhardt's butt cream, heparin, levalbuterol, ondansetron (ZOFRAN) IV, ondansetron (ZOFRAN) IV, oxyCODONE, sodium chloride flush     Assessment/Plan   1. Acute systolic HF ->  Cardiogenic Shock  - Due to iCM. EF 20-25% - Impella 5.5 placed on 4/2.   - s/p CABG/MVRepair on 4/6  - Impella out 4/9.  - Echo 4/10 with EF 25-30%, normal RV, no MR.  - Echo 4/22 EF 30-35% mildly reduced RV MVR ok    - Still/pressor/inotrope dependent. Milrinone off. NE down to 4. VP off. Wean NE as tolerated. Continue midodrine 15 tid -  Co-ox improved to 69% - Volume status looks good. Off CVVHD as of 4/29. Remains anuric Discussed with Nephrology. Will watch for any return of renal function. Can restart  CVVHD or switch to iHD as needed.   2. CAD - LHC with severe 3 vessel CAD  - No s/s angina - s/p CABG x 3 MV Repair 4/6. Echo stable - on ASA/Crestor  3. Acute hypoxic respiratory failure - Due to pulmonary edema and PNA - Extubated 4/9 re-intubated on 4/12. Failed re-extubation on 4/14. Now s/p tracheostomy - Completed antibiotic course.  - S/P Bronch 4/27 - 60K yeast. -> ? Colonization.  - Remains vented through trach. On TCT during the day. - respiratory status improved with volume removal. CXR improved. - May need LTAC  4. Mitral Regurgitation - Mod-severe on ECHO - s/p MV repair, TEE 4/9 with minimal MR s/p repair.   - Echo 4/10 with no significant MR.  - echo 4/22 MVR stable  5. Uncontrolled DM - Hgb A1C 9.  - On insulin and sliding scale.  - No change  6. AKI - due to shock - Remains anuric post-op - Volume status looks good. Off CVVHD. Discussed with Nephrology. Will watch for any return of renal function. Can restart CVVHD or switch to iHD as needed.   7. Acute blood loss Anemia  - Hgb 10.7 - No obvious bleeding.  - transfuse hgb < 7.5   8. Polymorphic VT - Recurrent VT during respiratory distress 4/14, - Off amio  - No further VT.  - Keep K> 4.0 Mg > 2.0  - no change  9. Severe Malnutrition - Prealbumin 8.9  - Nutrition on board - Continue w/ TFs  10. Atrial fibrillation. paroxysmal -  Remains in NSR.  - Amiodarone was stopped.   11. Debility, severe - will need SNF on d/c  12. Chronic pain issues -4/27  Pain was not controlled. Using IV fentanyl  ~14 x  a day. Switched to oxycontin 10 mg q 6 hours. Improved   CRITICAL CARE Performed by: Glori Bickers  Total critical care time: 35 minutes  Critical care time was exclusive of separately billable procedures and treating other patients.  Critical care was necessary to treat or prevent imminent or life-threatening deterioration.  Critical care was time spent personally by me  (independent of midlevel providers or residents) on the following activities: development of treatment plan with patient and/or surrogate as well as nursing, discussions with consultants, evaluation of patient's response to treatment, examination of patient, obtaining history from patient or surrogate, ordering and performing treatments and interventions, ordering and review of laboratory studies, ordering and review of radiographic studies, pulse oximetry and re-evaluation of patient's condition.   Glori Bickers, MD  9:05 AM

## 2019-05-16 NOTE — Progress Notes (Addendum)
[facility] DAILY ICU PROGRESS NOTE                   [address]            [place] [address]          [phone]   14 Days Post-Op Procedure(s) (LRB): VIDEO BRONCHOSCOPY USING DISPOSABLE ANESTHESIA SCOPE (N/A) TRACHEOSTOMY (N/A)  Total Length of Stay:  LOS: 32 days   Subjective: Patient sleeping and briefly awakened  Objective: Vital signs in last 24 hours: Temp:  [96.8 F (36 C)-98.3 F (36.8 C)] 98 F (36.7 C) (04/30 0400) Pulse Rate:  [36-92] 69 (04/30 0743) Cardiac Rhythm: Normal sinus rhythm;Sinus bradycardia (04/30 0400) Resp:  [7-24] 12 (04/30 0743) BP: (78-110)/(35-72) 110/58 (04/30 0743) SpO2:  [82 %-100 %] 98 % (04/30 0743) FiO2 (%):  [35 %-60 %] 35 % (04/30 0743) Weight:  [53.1 kg] 53.1 kg (04/30 0457)  Filed Weights   05/14/19 0500 05/15/19 0500 05/16/19 0457  Weight: 53.4 kg 52 kg 53.1 kg    Weight change: 1.1 kg   Hemodynamic parameters for last 24 hours: CVP:  [3 mmHg] 3 mmHg  Intake/Output from previous day: 04/29 0701 - 04/30 0700 In: 1402.8 [I.V.:137.8; NG/GT:1265] Out: 1178 [Stool:700]  Intake/Output this shift: No intake/output data recorded.  Current Meds: Scheduled Meds: . aspirin EC  325 mg Oral Daily   Or  . aspirin  324 mg Per Tube Daily  . B-complex with vitamin C  1 tablet Per Tube Daily  . chlorhexidine gluconate (MEDLINE KIT)  15 mL Mouth Rinse BID  . Chlorhexidine Gluconate Cloth  6 each Topical Daily  . docusate  200 mg Per Tube Daily  . feeding supplement (PRO-STAT SUGAR FREE 64)  30 mL Oral TID  . fluconazole  200 mg Per Tube Daily  . gabapentin  300 mg Per Tube Q8H  . insulin aspart  0-24 Units Subcutaneous Q4H  . insulin aspart  2 Units Subcutaneous Q4H  . insulin glargine  14 Units Subcutaneous BID  . mouth rinse  15 mL Mouth Rinse 10 times per day  . metoCLOPramide (REGLAN) injection  10 mg Intravenous Q8H  . midodrine  15 mg Per Tube Q8H  . neomycin-polymyxin-hydrocortisone  3 drop Left EAR Q12H    . oxyCODONE  10 mg Per Tube Q6H  . pantoprazole sodium  40 mg Per Tube BID  . pneumococcal 23 valent vaccine  0.5 mL Intramuscular Tomorrow-1000  . sodium chloride flush  10-40 mL Intracatheter Q12H   Continuous Infusions: . epinephrine Stopped (04/28/19 0138)  . feeding supplement (VITAL 1.5 CAL) 55 mL/hr at 05/16/19 0600  . heparin 10,000 units/ 20 mL infusion syringe 1,000 Units/hr (05/15/19 0624)  . lactated ringers Stopped (05/14/2019 1640)  . lactated ringers Stopped (04/30/19 0930)  . norepinephrine (LEVOPHED) Adult infusion 4 mcg/min (05/16/19 0700)  . vasopressin (PITRESSIN) infusion - *FOR SHOCK* Stopped (05/15/19 1725)   PRN Meds:.Gerhardt's butt cream, heparin, levalbuterol, ondansetron (ZOFRAN) IV, ondansetron (ZOFRAN) IV, oxyCODONE, sodium chloride flush  Heart: RRR Lungs: Mostly clear Abdomen: Soft, non tender,  bowel sounds present Extremities: Kerlex and ACE wraps in place Wound: Sternal dressing is clean and dry;right axillary wound is clean and dry (staples removed 04/29)  Lab Results: CBC: Recent Labs    05/15/19 0302 05/15/19 0302 05/15/19 0443 05/16/19 0356  WBC 14.6*  --   --  15.4*  HGB 10.1*   < > 11.2* 10.7*  HCT 33.6*   < >  33.0* 35.1*  PLT 261  --   --  272   < > = values in this interval not displayed.   BMET:  Recent Labs    05/15/19 0302 05/15/19 0302 05/15/19 0443 05/16/19 0356  NA 135   < > 133* 134*  K 5.0   < > 5.1 4.4  CL 101  --   --  101  CO2 27  --   --  24  GLUCOSE 226*  --   --  153*  BUN 34*  --   --  69*  CREATININE 0.91  --   --  1.88*  CALCIUM 8.4*  --   --  8.4*   < > = values in this interval not displayed.    CMET: Lab Results  Component Value Date   WBC 15.4 (H) 05/16/2019   HGB 10.7 (L) 05/16/2019   HCT 35.1 (L) 05/16/2019   PLT 272 05/16/2019   GLUCOSE 153 (H) 05/16/2019   CHOL 174 08/21/2017   TRIG 143 08/21/2017   HDL 57 08/21/2017   LDLCALC 88 08/21/2017   ALT 56 (H) 05/15/2019   AST 44 (H)  05/15/2019   NA 134 (L) 05/16/2019   K 4.4 05/16/2019   CL 101 05/16/2019   CREATININE 1.88 (H) 05/16/2019   BUN 69 (H) 05/16/2019   CO2 24 05/16/2019   TSH 0.925 03/28/2019   INR 1.6 (H) 05/01/2019   HGBA1C 9.0 (H) 04/08/2019   MICROALBUR 30 08/21/2017    PT/INR:  No results for input(s): LABPROT, INR in the last 72 hours. Radiology: No results found.  Assessment/Plan: S/P Procedure(s) (LRB): VIDEO BRONCHOSCOPY USING DISPOSABLE ANESTHESIA SCOPE (N/A) TRACHEOSTOMY (N/A)  1. CV-S/p removal of Impella on 04/09.  S/p V tach arrest 04/15. Previous a fib. SR with HR in the 70's this am.  On  Midodrine 15 mg tid. Also, on Nor epinephrine drip at 4 mcg/min. Have been weaning drips as able.  Co ox this am decreased to 68.5 (off Milrinone) 2. Pulmonary-S/p trach 04/16. .On trach collar most of the day and ventilator at night (FiO2% 35). CXR this am shows no pneumothorax, ?ARDS, pulmonary edema and/or pneumonia. 3. Expected post op blood loss anemia-H and H this am stable at 10.7 and 35.1 4. DM-CBGs 136/127/148. On Insulin. He is on Insulin and this has been increased for better glucose control over the last 2 days. He was on Metformin 1000 mg bid prior to surgery, but will continue on Insulin for now as NPO. Pre op HGA1C 9. He will need close medical follow up after discharge 5. AKI-Anuric. Creatinine increased to 1.88 this am. Nephrology following and arranging for CVVHD accordingly 6. GI-severe malnutrition of chronic illness.  Cortrak, TFs. Speech pathology evaluation done yesterday and recommendations to be followed accordingly. Albumin increased to 2.4 7. Elevated transaminases (likely related to shock)-Yesterday AST 44, ALT 56, ALk phos 469 and have been stable   8. ID-Leukocytosis increased to WBC to 15,400 (likely related to aspiration). Previous yeast in sputum and was on Zyvox and Zosyn for broader coverage. Of note, he completed Fluconazole for Candida in urine. Completed Vanco and  Cefepime for Enterococcus Faecalis. ESR was elevated so has been given Solu Medrol, which may increase WBC 9. Acute systolic heart failure-CVVHD helping with volume status 11. Extremely deconditioned-will need PT as able. May need LTAC once ready for discharge 12.  Will discuss with surgeon when ok to remove EPW  [name] PA-C 05/16/2019 8:03 AM  Chart reviewed, patient examined, agree with above. Remains on NE 4 mcg and midodrine 15 tid.  BP dropped this am getting up to chair and then recovered. He has been on Neurontin 300 every 8 hrs which I guess was started for his chronic pain but he was not on that at home and with his renal failure the dose is too high and may be contributing to his hypotension. It does not seem to have helped his chronic pain. Will stop it may cause more harm than good. It will take a while to get out of his system with renal failure. Will have to decide about HD vs CRRT tomorrow depending on BP.

## 2019-05-16 NOTE — Progress Notes (Signed)
NAME:  Brendan [name], MRN:  [id number], DOB:  June 28, 1955, LOS: 26 ADMISSION DATE:  03/22/2019, CONSULTATION DATE:  04/17/19 REFERRING MD:  Dr. Haroldine Laws, CHIEF COMPLAINT:  Respiratory distress   Brief History   32 yoM originally presented with SOB and fatigue at [facility] found to have new HFrEF, NSTEMI, and bilateral pleural effusions transferred to [facility] on 3/29 for further cardiac evaluation. 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 Started on lasix and milrinone gtts however developed NSVT. Was taken 4/1 for placement of IABP and swan for optimization prior to CABG. Was on precedex for agitation/ confusion, some concern for DTs. On the evening of 4/1, patient developed worsening respiratory distress and hypoxia, PCCM consulted for intubation and vent management.  Patient was extubated on 4/14 and developed worsening hypoxia on BiPAP. Overnight he developed pulseless VT requiring 1 minute of CPR and epinephrine to achieve ROSC. Neurovascularly intact afterward. Subsequently had increased work of breathing and worsening respiratory distress requiring intubation. Patient had tracheostomy performed on 04/24/2019. Trying to wean off ventilator.   Past Medical History  Tobacco abuse, HTN, poorly controlled diabetes, diabetic neuropathy  Significant Hospital Events   3/30 Lt heart cath/TEE performed 4/1 Intubated, RHC/IABP/PA cath 4/2 Impella placement 4/3 febrile over evening and night. Blood cultures obtained. Vanc/cefepime started. Hematuria--heparin held. 4/4 Extubated,abx transitioned to rocephin. Heparin restarted 4/6 Re-intubated for CABG/MVR 4/7 Extubated from CABG to BiPAP 4/9 Impella removed  4/9 TEE 4/10 CVC inserted &CRRT initiated  4/12 PCCMre-consulted re-intubation 4/14 Extubated to BiPAP 4/15 Re-intubated due to respiratory distress 4/16 Tracheostomy 4/27 bronch with BAL  Consults:  TCTS PCCM HF Nephrology  Procedures:  4/15ET tube - 4/15 4/9 Cortrak> 4/6 Rt  IJ> 4/10 HD cath- 4/13 4/12 foley cath> 3/30 PICC> 4/13 HD cath> 4/16 Tracheostomy>  Significant Diagnostic Tests:  3/30 R/ LHC >>  Ost LM to Mid LM lesion is 35% stenosed.  Prox LAD lesion is 90% stenosed.  Prox LAD to Mid LAD lesion is 50% stenosed.  Mid Cx lesion is 100% stenosed.  Prox RCA to Mid RCA lesion is 90% stenosed.  RPDA lesion is 95% stenosed.  LV end diastolic pressure is severely elevated.  Hemodynamic findings consistent with moderate pulmonary hypertension. 1. Severe 3 vessel obstructive CAD 2. High LV filling pressures 3. Reduced cardiac output with index 2.3 4. Moderate pulmonary HTN.  3/30TTE >> 1. Left ventricular ejection fraction, by estimation, is 30 to 35%. The left ventricle has moderately decreased function. The left ventricle demonstrates regional wall motion abnormalities.There is mild left ventricular hypertrophy. Left ventricular diastolic function could not be evaluated. There is severe hypokinesis of the left ventricular, entire inferior wall, inferoseptal wall, apical segment and lateral wall.  2. Right ventricular systolic function is hyperdynamic. The right ventricular size is normal. There is moderately elevated pulmonary artery systolic pressure.  3. Decreased posterior leaflet motion due to ischemic tethering of the mitral valve leaflets.  4. The mitral valve is abnormal. Moderate to severe mitral valve regurgitation.  5. The aortic valve is tricuspid. Aortic valve regurgitation is not visualized. Mild aortic valve sclerosis is present, with no evidence of aortic valve stenosis.  6. The inferior vena cava is normal in size with greater than 50% respiratory variability, suggesting right atrial pressure of 3 mmHg.   4/1 CT chest w/o >>Large bilateral pleural effusions with associated atelectasis.Moderate to severe bilateral ground-glass opacities, likely reflecting pneumonia and/or edema.  4/1 RHC >> RA = 18 RV = 60/20  PA  = 63/29 (  35) PCW = 33 Fick cardiac output/index = 3.7/1.9 PVR = 2.5 WU FA sat = 98% PA sat = 47%, 49% PaPi = 2.2   Micro Data:  3/30 MRSA PCR >> neg 4/1 MRSA PCR >> neg 4/2 resp culture>>rare gram pos cocci, few candida albicans 4/3 Urine culture>>negative 4/4 blood cultures>>no growth  4/9 Surgical wound>> rare enterococcus faecalis 4/9 BCx2>>no gorwth 4/9 Urine Culture>> greater than 100k yeast 4/12 Resp >. Negative 4/16 BAL >> negative (AFB, fungal negative) 4/20 Resp >> rare yeast >>  4/20 blood >>  4/21 Resp >> rare yeast >>  4/26 trach aspirate>> few candida albicans 4/27 BAL fungus> yeast 4/27 BAL resp>> 4/27 BAL AFB>> 4/27 PJP smear>>   Antimicrobials:  PTA ceftriaxone/ doxycycline >> 3/29 Ceftriaxone 4/4-4/5 Cefepime 4/4>>4/11 vanc 4/4>>4/6 Rocephin 4/4>>4/5 Cefuroxime 4/6-4/7 Ampicillin 4/11 Vancomycin 4/12>>4/13 Cefepime 4/12>>4/13 Augmentin / unasyn 4/15> 4/20 Zosyn 4/20 >>  linezolid 4/20 >>    Interim history/subjective:  Up in chair. No distress.   Objective   Blood pressure (Abnormal) 95/56, pulse 74, temperature (Abnormal) 97.3 F (36.3 C), temperature source Oral, resp. rate (Abnormal) 21, height _0  (1.803 m), weight 53.1 kg, SpO2 100 %. CVP:  [3 mmHg] 3 mmHg  Vent Mode: PRVC FiO2 (%):  [35 %-40 %] 35 % Set Rate:  [16 bmp] 16 bmp Vt Set:  [450 mL] 450 mL PEEP:  [5 cmH20] 5 cmH20 Plateau Pressure:  [16 cmH20-17 cmH20] 16 cmH20   Intake/Output Summary (Last 24 hours) at 05/16/2019 1120 Last data filed at 05/16/2019 1100 Gross per 24 hour  Intake 1625.03 ml  Output 910 ml  Net 715.03 ml   Filed Weights   05/14/19 0500 05/15/19 0500 05/16/19 0457  Weight: 53.4 kg 52 kg 53.1 kg    Examination:    General: 64 year old white male currently sitting up in chair he is in no acute distress this morning HEENT normocephalic atraumatic tracheostomy which is cuffed, currently with aerosol trach collar, no drainage or discharge Pulmonary:  Clear, diminished bases no accessory use currently 35% aerosol trach collar Cardiac: Regular rate and rhythm without murmur rub or gallop Abdomen: Soft nontender no organomegaly Extremities warm dry brisk cap refill Neuro awake following commands   Resolved Hospital Problem list     Assessment & Plan:  Acute hypoxic respiratory failure, multifactorial.  Pulmonary edema improved with more aggressive UF, but increased sputum production. Possibly candida pneumonia vs colonization.  -pcxr w/ improved bilateral airspace disease/decreased edema Plan Cont volume removal w/ HD Cont nocturnal vent for now.  Mobilize  Routine trach care   HFrEF (20-25%), cardiogenic shock, NSTEMI with three-vessel disease, NSVT Polymorphic VT Plan Per HF team   Worsening leukocytosis No fever spike. 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 Now on azole. Plan Monitor   Best practice:  Diet: Tube feeds Pain/Anxiety/Delirium protocol (if indicated):Fentanyl VAP protocol (if indicated):HOB 30 degrees,  DVT prophylaxis: heparin  GI prophylaxis: PPI Glucose control:SSI Mobility: BR Code Status: Full  Family Communication:per primary team; wife updated at bedside during exam [place]ram ACNP-BC [place] Pager # [phone] OR # [phone] if no answer

## 2019-05-16 NOTE — Progress Notes (Signed)
Physical Therapy Treatment Patient Details Name: Brendan [name] MRN: [id number] DOB: July 25, 1955 Today's Date: 05/16/2019    History of Present Illness Pt presented with SOB and fatigue at OHS found to have new HFrEF,  NSTEMI, and bilateral pleural effusions transferred to [facility] on 3/29 for further cardiac evaluation.  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  On the evening of 4/1, patient developed worsening respiratory distress and hypoxia, head R/L heart cath on 3/30 & R heart cath on 4/1 pt intubated on 4/1, extubated 4/4, had R & L chest tubes placed 4/2 due to bilateral pleural effusions, removed 4/5 and impella device placed 4/2.  CABG and MVR on 4/6. Extubated 4/7. Removal of impella 4/9. CVVH started 4/10 and off 4/30. Respiratory distress and reintubated 4/12. Extubated 4/14. Reintubated 4/15. Trach 4/16.  PMHx of Tobacco abuse, HTN, poorly controlled DM, diabetic neuropathy    PT Comments    Pt making slow, steady progress with mobility. Able to get pt bed to chair with Stedy but pt very orthostatic and with decr responsiveness. BP 64/42 while transitioning to chair on Stedy. BP 75/54 then 86/51 then 102/50 after sitting in recliner with feet up and back of chair almost flat. Pt responsive once chair and pressure rising. Nursing aware. Recommend performing lateral slide from flat recliner with arm dropped over to bed when return to bed.   Follow Up Recommendations  LTACH     Equipment Recommendations  Other (comment)(TBA)    Recommendations for Other Services       Precautions / Restrictions Precautions Precautions: Fall;Sternal Precaution Comments: CRRT (off this session), flexiseal, 35% trach collar Restrictions Weight Bearing Restrictions: No    Mobility  Bed Mobility Overal bed mobility: Needs Assistance Bed Mobility: Supine to Sit     Supine to sit: +2 for physical assistance;Mod assist     General bed mobility comments: Assist to bring legs off of bed, elevate trunk  into sitting and bring hips to EOB.   Transfers Overall transfer level: Needs assistance Equipment used: Ambulation equipment used Transfers: Sit to/from Stand Sit to Stand: +2 physical assistance;Max assist         General transfer comment: Assist to bring hips up and for balance. Pt unable to fully extend hips and trunk. Used Stedy for bed to recliner with decr responsiveness. See comments for BP.   Ambulation/Gait             General Gait Details: unable   Stairs             Wheelchair Mobility    Modified Rankin (Stroke Patients Only)       Balance Overall balance assessment: Needs assistance Sitting-balance support: Bilateral upper extremity supported Sitting balance-Leahy Scale: Poor Sitting balance - Comments: Pt sat EOB x 5 minutes with min to mod assist. Postural control: Left lateral lean Standing balance support: Bilateral upper extremity supported Standing balance-Leahy Scale: Zero Standing balance comment: +2 max assist for static standing in flexed position.                             Cognition Arousal/Alertness: Awake/alert Behavior During Therapy: WFL for tasks assessed/performed Overall Cognitive Status: Difficult to assess                                 General Comments: follows one step commands      Exercises  General Comments        Pertinent Vitals/Pain Pain Assessment: Faces Faces Pain Scale: Hurts a little bit Pain Location: abdominal Pain Descriptors / Indicators: Grimacing Pain Intervention(s): Monitored during session;Limited activity within patient's tolerance    Home Living                      Prior Function            PT Goals (current goals can now be found in the care plan section) Progress towards PT goals: Progressing toward goals    Frequency    Min 3X/week      PT Plan Current plan remains appropriate    Co-evaluation              AM-PAC PT  "6 Clicks" Mobility   Outcome Measure  Help needed turning from your back to your side while in a flat bed without using bedrails?: A Lot Help needed moving from lying on your back to sitting on the side of a flat bed without using bedrails?: A Lot Help needed moving to and from a bed to a chair (including a wheelchair)?: Total Help needed standing up from a chair using your arms (e.g., wheelchair or bedside chair)?: Total Help needed to walk in hospital room?: Total Help needed climbing 3-5 steps with a railing? : Total 6 Click Score: 8    End of Session Equipment Utilized During Treatment: Oxygen Activity Tolerance: Treatment limited secondary to medical complications (Comment)(orthostatic) Patient left: with call bell/phone within reach;in chair Nurse Communication: Mobility status;Other (comment)(recommend lateral slide in supine from recliner back to bed) PT Visit Diagnosis: Other abnormalities of gait and mobility (R26.89);Muscle weakness (generalized) (M62.81)     Time: 1610-9604 PT Time Calculation (min) (ACUTE ONLY): 32 min  Charges:  $Therapeutic Activity: 23-37 mins                     [place] Pager [phone] Office [place] 05/16/2019, 11:02 AM

## 2019-05-17 LAB — BASIC METABOLIC PANEL
Anion gap: 16 — ABNORMAL HIGH (ref 5–15)
Anion gap: 11 (ref 5–15)
BUN: 127 mg/dL — ABNORMAL HIGH (ref 8–23)
BUN: 41 mg/dL — ABNORMAL HIGH (ref 8–23)
CO2: 23 mmol/L (ref 22–32)
CO2: 27 mmol/L (ref 22–32)
Calcium: 8.7 mg/dL — ABNORMAL LOW (ref 8.9–10.3)
Calcium: 8.1 mg/dL — ABNORMAL LOW (ref 8.9–10.3)
Chloride: 95 mmol/L — ABNORMAL LOW (ref 98–111)
Chloride: 98 mmol/L (ref 98–111)
Creatinine, Ser: 3.49 mg/dL — ABNORMAL HIGH (ref 0.61–1.24)
Creatinine, Ser: 1.72 mg/dL — ABNORMAL HIGH (ref 0.61–1.24)
GFR calc Af Amer: 21 mL/min — ABNORMAL LOW (ref >60)
GFR calc Af Amer: 49 mL/min — ABNORMAL LOW (ref >60)
GFR calc non Af Amer: 18 mL/min — ABNORMAL LOW (ref >60)
GFR calc non Af Amer: 42 mL/min — ABNORMAL LOW (ref >60)
Glucose, Bld: 159 mg/dL — ABNORMAL HIGH (ref 70–99)
Glucose, Bld: 199 mg/dL — ABNORMAL HIGH (ref 70–99)
Potassium: 5.9 mmol/L — ABNORMAL HIGH (ref 3.5–5.1)
Potassium: 4.2 mmol/L (ref 3.5–5.1)
Sodium: 134 mmol/L — ABNORMAL LOW (ref 135–145)
Sodium: 136 mmol/L (ref 135–145)

## 2019-05-17 LAB — COOXEMETRY PANEL
Carboxyhemoglobin: 1.8 % — ABNORMAL HIGH (ref 0.5–1.5)
Methemoglobin: 0.7 % (ref 0.0–1.5)
O2 Saturation: 64.1 %
Total hemoglobin: 10.5 g/dL — ABNORMAL LOW (ref 12.0–16.0)

## 2019-05-17 LAB — RENAL FUNCTION PANEL
Albumin: 1.9 g/dL — ABNORMAL LOW (ref 3.5–5.0)
Anion gap: 13 (ref 5–15)
BUN: 119 mg/dL — ABNORMAL HIGH (ref 8–23)
CO2: 23 mmol/L (ref 22–32)
Calcium: 8.5 mg/dL — ABNORMAL LOW (ref 8.9–10.3)
Chloride: 98 mmol/L (ref 98–111)
Creatinine, Ser: 3.36 mg/dL — ABNORMAL HIGH (ref 0.61–1.24)
GFR calc Af Amer: 22 mL/min — ABNORMAL LOW (ref >60)
GFR calc non Af Amer: 19 mL/min — ABNORMAL LOW (ref >60)
Glucose, Bld: 160 mg/dL — ABNORMAL HIGH (ref 70–99)
Phosphorus: 8.2 mg/dL — ABNORMAL HIGH (ref 2.5–4.6)
Potassium: 6.2 mmol/L — ABNORMAL HIGH (ref 3.5–5.1)
Sodium: 134 mmol/L — ABNORMAL LOW (ref 135–145)

## 2019-05-17 LAB — CBC
HCT: 35.2 % — ABNORMAL LOW (ref 39.0–52.0)
HCT: 34.3 % — ABNORMAL LOW (ref 39.0–52.0)
Hemoglobin: 11 g/dL — ABNORMAL LOW (ref 13.0–17.0)
Hemoglobin: 10.5 g/dL — ABNORMAL LOW (ref 13.0–17.0)
MCH: 30.9 pg (ref 26.0–34.0)
MCH: 30 pg (ref 26.0–34.0)
MCHC: 31.3 g/dL (ref 30.0–36.0)
MCHC: 30.6 g/dL (ref 30.0–36.0)
MCV: 98.9 fL (ref 80.0–100.0)
MCV: 98 fL (ref 80.0–100.0)
Platelets: 212 10*3/uL (ref 150–400)
Platelets: 219 10*3/uL (ref 150–400)
RBC: 3.56 MIL/uL — ABNORMAL LOW (ref 4.22–5.81)
RBC: 3.5 MIL/uL — ABNORMAL LOW (ref 4.22–5.81)
RDW: 18.2 % — ABNORMAL HIGH (ref 11.5–15.5)
RDW: 18.3 % — ABNORMAL HIGH (ref 11.5–15.5)
WBC: 17.6 10*3/uL — ABNORMAL HIGH (ref 4.0–10.5)
WBC: 18.4 10*3/uL — ABNORMAL HIGH (ref 4.0–10.5)
nRBC: 0 % (ref 0.0–0.2)
nRBC: 0 % (ref 0.0–0.2)

## 2019-05-17 LAB — GLUCOSE, CAPILLARY
Glucose-Capillary: 141 mg/dL — ABNORMAL HIGH (ref 70–99)
Glucose-Capillary: 144 mg/dL — ABNORMAL HIGH (ref 70–99)
Glucose-Capillary: 153 mg/dL — ABNORMAL HIGH (ref 70–99)
Glucose-Capillary: 165 mg/dL — ABNORMAL HIGH (ref 70–99)
Glucose-Capillary: 177 mg/dL — ABNORMAL HIGH (ref 70–99)
Glucose-Capillary: 180 mg/dL — ABNORMAL HIGH (ref 70–99)
Glucose-Capillary: 91 mg/dL (ref 70–99)

## 2019-05-17 LAB — HEPATITIS B SURFACE ANTIGEN: Hepatitis B Surface Ag: NONREACTIVE

## 2019-05-17 LAB — HEPATITIS B CORE ANTIBODY, TOTAL: Hep B Core Total Ab: NONREACTIVE

## 2019-05-17 LAB — MAGNESIUM: Magnesium: 3.1 mg/dL — ABNORMAL HIGH (ref 1.7–2.4)

## 2019-05-17 MED ORDER — CALCIUM GLUCONATE-NACL 1-0.675 GM/50ML-% IV SOLN
1.0000 g | Freq: Once | INTRAVENOUS | Status: AC
  Administered 2019-05-17: 1000 mg via INTRAVENOUS
  Filled 2019-05-17: qty 50

## 2019-05-17 MED ORDER — ALBUMIN HUMAN 25 % IV SOLN: 25.0000 g | Freq: Once | INTRAVENOUS | Status: AC

## 2019-05-17 MED ORDER — CHLORHEXIDINE GLUCONATE CLOTH 2 % EX PADS
6.0000 | MEDICATED_PAD | Freq: Every day | CUTANEOUS | Status: DC
  Administered 2019-05-17 – 2019-05-18 (×2): 6 via TOPICAL

## 2019-05-17 MED ORDER — HEPARIN SODIUM (PORCINE) 1000 UNIT/ML IJ SOLN
INTRAMUSCULAR | Status: AC
  Administered 2019-05-17: 1000 [IU] via INTRAVENOUS_CENTRAL
  Filled 2019-05-17: qty 4

## 2019-05-17 MED ORDER — SODIUM POLYSTYRENE SULFONATE 15 GM/60ML PO SUSP
30.0000 g | Freq: Once | ORAL | Status: AC
  Administered 2019-05-17: 30 g
  Filled 2019-05-17: qty 120

## 2019-05-17 MED ORDER — ALBUMIN HUMAN 25 % IV SOLN
INTRAVENOUS | Status: AC
  Administered 2019-05-17: 25 g via INTRAVENOUS
  Filled 2019-05-17: qty 100

## 2019-05-17 NOTE — Progress Notes (Signed)
      [address]       [place] [address]             [phone]                 15 Days Post-Op Procedure(s) (LRB): VIDEO BRONCHOSCOPY USING DISPOSABLE ANESTHESIA SCOPE (N/A) TRACHEOSTOMY (N/A)   Events: Tolerated TC overnight Labile BP _______________________________________________________________ Vitals: BP (!) 97/51   Pulse (!) 114   Temp 98.6 F (37 C) (Core)   Resp 19   Ht 5\' 11"  (1.803 m) Comment: measured x 3  Wt 56.1 kg   SpO2 99%   BMI 17.25 kg/m   - Neuro: alert NAD  - Cardiovascular: sinus  Drips: levo.   CVP:  [2 mmHg] 2 mmHg  - Pulm: on TC.  Coarse BS FiO2 (%):  [35 %] 35 %  ABG    Component Value Date/Time   PHART 7.369 05/15/2019 0443   PCO2ART 47.1 05/15/2019 0443   PO2ART 69 (L) 05/15/2019 0443   HCO3 27.3 05/15/2019 0443   TCO2 29 05/15/2019 0443   ACIDBASEDEF 3.0 (H) 04/28/2019 0735   O2SAT 64.1 05/17/2019 0329      .Intake/Output      04/30 0701 - 05/01 0700 05/01 0701 - 05/02 0700   I.V. (mL/kg) 106.6 (2) 6.5 (0.1)   NG/GT 1825 55   IV Piggyback 35.7 14.2   Total Intake(mL/kg) 1967.3 (37.3) 75.7 (1.3)   Other     Stool     Total Output     Net +1967.3 +75.7           _______________________________________________________________ Labs: CBC Latest Ref Rng & Units 05/17/2019 05/17/2019 05/16/2019  WBC 4.0 - 10.5 K/uL 18.4(H) 17.6(H) 15.4(H)  Hemoglobin 13.0 - 17.0 g/dL 10.5(L) 11.0(L) 10.7(L)  Hematocrit 39.0 - 52.0 % 34.3(L) 35.2(L) 35.1(L)  Platelets 150 - 400 K/uL 219 212 272   CMP Latest Ref Rng & Units 05/17/2019 05/17/2019 05/16/2019  Glucose 70 - 99 mg/dL 159(H) 160(H) 153(H)  BUN 8 - 23 mg/dL 127(H) 119(H) 69(H)  Creatinine 0.61 - 1.24 mg/dL 3.49(H) 3.36(H) 1.88(H)  Sodium 135 - 145 mmol/L 134(L) 134(L) 134(L)  Potassium 3.5 - 5.1 mmol/L 5.9(H) 6.2(H) 4.4  Chloride 98 - 111 mmol/L 95(L) 98 101  CO2 22 - 32 mmol/L 23 23 24   Calcium 8.9 - 10.3 mg/dL 8.7(L) 8.5(L) 8.4(L)  Total Protein 6.5 - 8.1  g/dL - - -  Total Bilirubin 0.3 - 1.2 mg/dL - - -  Alkaline Phos 38 - 126 U/L - - -  AST 15 - 41 U/L - - -  ALT 0 - 44 U/L - - -    CXR: pending  _______________________________________________________________  Assessment and Plan:  s/p CABG, MVR, impella placement on 4/6, impella removed 4/9.  Tracheostomy 4/16  Neuro: pain better controlled CV: labile BP, but tolerating HD Pulm: on TC overnight, and this am, will continue Renal: on HD.   GI: on TF Heme: stable ID: [email or web address], on fluconazole Dispo: continue ICU care  Melodie Bouillon, MD 05/17/2019 10:04 AM

## 2019-05-17 NOTE — Progress Notes (Signed)
[facility] Progress Note:   34M dialysis dependent AKI; cardiogenic shock status post CABG + MV replacement 4/2  S: Pt anuric.  Last had RRT with 478 mL UF over 4/29 with CRRT (was keep even then stopped late AM/early afternoon) that day.  Levo is at 5 mcg/min.  Has had more trouble with breathing overnight.   Review of systems:    Does report shortness of breath  Generalized pain is present but better  Denies n/v    O: 04/30 0701 - 05/01 0700 In: 1812.2 [I.V.:97.2; NG/GT:1715] Out: -   Filed Weights   05/15/19 0500 05/16/19 0457 05/17/19 0500  Weight: 52 kg 53.1 kg 52.7 kg    Physical exam:   General adult male in bed critically ill   HEENT normocephalic atraumatic extraocular movements intact sclera anicteric Lungs coarse mechanical breath sounds   Heart S1S2 on levo  Abdomen soft nontender nondistended Extremities 1-2+ pedal edema  Neuro - awake and interactive follows commands Psych no anxiety or agitation Access: left subclavian temporary HD catheter   Recent Labs  Lab 05/15/19 0302 05/15/19 0302 05/15/19 0443 05/16/19 0356 05/17/19 0329  NA 135   < > 133* 134* 134*  K 5.0   < > 5.1 4.4 6.2*  CL 101  --   --  101 98  CO2 27  --   --  24 23  GLUCOSE 226*  --   --  153* 160*  BUN 34*  --   --  69* 119*  CREATININE 0.91  --   --  1.88* 3.36*  CALCIUM 8.4*  --   --  8.4* 8.5*  PHOS 3.4  --   --  4.8* 8.2*   < > = values in this interval not displayed.   Recent Labs  Lab 05/14/19 0505 05/14/19 0505 05/15/19 0302 05/15/19 0443 05/16/19 0356  WBC 11.7*  --  14.6*  --  15.4*  HGB 10.4*   < > 10.1* 11.2* 10.7*  HCT 34.1*   < > 33.6* 33.0* 35.1*  MCV 98.8  --  98.8  --  101.2*  PLT 241  --  261  --  272   < > = values in this interval not displayed.    Scheduled Meds: . aspirin EC  325 mg Oral Daily   Or  . aspirin  324 mg Per Tube Daily  . B-complex with vitamin C  1 tablet Per Tube Daily  . chlorhexidine gluconate (MEDLINE KIT)  15 mL Mouth  Rinse BID  . Chlorhexidine Gluconate Cloth  6 each Topical Daily  . docusate  200 mg Per Tube Daily  . feeding supplement (PRO-STAT SUGAR FREE 64)  30 mL Oral TID  . fluconazole  200 mg Per Tube Daily  . insulin aspart  0-24 Units Subcutaneous Q4H  . insulin aspart  2 Units Subcutaneous Q4H  . insulin glargine  14 Units Subcutaneous BID  . mouth rinse  15 mL Mouth Rinse 10 times per day  . metoCLOPramide (REGLAN) injection  5 mg Intravenous Q8H  . midodrine  15 mg Per Tube Q8H  . neomycin-polymyxin-hydrocortisone  3 drop Left EAR Q12H  . oxyCODONE  10 mg Per Tube Q6H  . pantoprazole sodium  40 mg Per Tube BID  . pneumococcal 23 valent vaccine  0.5 mL Intramuscular Tomorrow-1000  . sodium chloride flush  10-40 mL Intracatheter Q12H   Continuous Infusions: . feeding supplement (VITAL 1.5 CAL) 1,000 mL (05/17/19 0230)  . heparin  10,000 units/ 20 mL infusion syringe 1,000 Units/hr (05/15/19 0624)  . lactated ringers Stopped (04/24/2019 1640)  . lactated ringers Stopped (04/30/19 0930)  . norepinephrine (LEVOPHED) Adult infusion 5 mcg/min (05/17/19 0500)   PRN Meds:.Gerhardt's butt cream, heparin, levalbuterol, ondansetron (ZOFRAN) IV, ondansetron (ZOFRAN) IV, oxyCODONE, sodium chloride flush  ABG    Component Value Date/Time   PHART 7.369 05/15/2019 0443   PCO2ART 47.1 05/15/2019 0443   PO2ART 69 (L) 05/15/2019 0443   HCO3 27.3 05/15/2019 0443   TCO2 29 05/15/2019 0443   ACIDBASEDEF 3.0 (H) 04/28/2019 0735   O2SAT 64.1 05/17/2019 0329    A/P  1. Dialysis dependent AKI, anuric.  CRRT 4/10 - 4/29.  Left subclavian temp HD catheter on 4/13 with CTS.  1. Will attempt HD today - if does not tolerate or if improvement in volume status not sufficient for resp status will transition to CRRT  2. Follow volume status and clearance closely for renal replacement therapy needs.  3. Hyperkalemia - note order for kayexalate; gave Ca; HD today note repeat BMP is ordered 2. Hypophosphatemia, from  CRRT - resolved now hyperphosphatemia 3. Enterococcus faecalis pneumonia; per primary and CCM 4. Acute hypoxic respiratory failure s/p trach 4/16 5. Cardiogenic shock on pressors and midodrine per primary   6. CAD status post CABG 7. Mitral regurgitation status post mitral valve repair 8. diabetes mellitus 2 - per primary  9. Acute blood loss anemia - stable last check 10. Recurrent polymorphic ventricular tachycardia, s/p amiodarone    Claudia Desanctis 05/17/2019  5:31 AM

## 2019-05-17 NOTE — Progress Notes (Addendum)
[facility] Progress Note Patient Name: OCTAVIO MATHENEY DOB: [birth year] MRN: [id number]   Date of Service  05/17/2019  HPI/Events of Note  K+ 6.2  eICU Interventions  Kayexalate 30 gm via NG tube x 1, BMET at 10 a.m.        Kerry Kass Mikaele Stecher 05/17/2019, 5:18 AM

## 2019-05-17 DEATH — deceased

## 2019-05-18 ENCOUNTER — Inpatient Hospital Stay (HOSPITAL_COMMUNITY): Payer: Medicaid Other

## 2019-05-18 LAB — GLUCOSE, CAPILLARY
Glucose-Capillary: 142 mg/dL — ABNORMAL HIGH (ref 70–99)
Glucose-Capillary: 189 mg/dL — ABNORMAL HIGH (ref 70–99)
Glucose-Capillary: 242 mg/dL — ABNORMAL HIGH (ref 70–99)
Glucose-Capillary: 187 mg/dL — ABNORMAL HIGH (ref 70–99)
Glucose-Capillary: 88 mg/dL (ref 70–99)
Glucose-Capillary: 162 mg/dL — ABNORMAL HIGH (ref 70–99)

## 2019-05-18 LAB — CBC
HCT: 31.5 % — ABNORMAL LOW (ref 39.0–52.0)
Hemoglobin: 9.6 g/dL — ABNORMAL LOW (ref 13.0–17.0)
MCH: 30.1 pg (ref 26.0–34.0)
MCHC: 30.5 g/dL (ref 30.0–36.0)
MCV: 98.7 fL (ref 80.0–100.0)
Platelets: 151 10*3/uL (ref 150–400)
RBC: 3.19 MIL/uL — ABNORMAL LOW (ref 4.22–5.81)
RDW: 18.2 % — ABNORMAL HIGH (ref 11.5–15.5)
WBC: 11.5 10*3/uL — ABNORMAL HIGH (ref 4.0–10.5)
nRBC: 0 % (ref 0.0–0.2)

## 2019-05-18 LAB — RENAL FUNCTION PANEL
Albumin: 2.1 g/dL — ABNORMAL LOW (ref 3.5–5.0)
Anion gap: 12 (ref 5–15)
BUN: 75 mg/dL — ABNORMAL HIGH (ref 8–23)
CO2: 28 mmol/L (ref 22–32)
Calcium: 8.1 mg/dL — ABNORMAL LOW (ref 8.9–10.3)
Chloride: 95 mmol/L — ABNORMAL LOW (ref 98–111)
Creatinine, Ser: 2.64 mg/dL — ABNORMAL HIGH (ref 0.61–1.24)
GFR calc Af Amer: 29 mL/min — ABNORMAL LOW (ref >60)
GFR calc non Af Amer: 25 mL/min — ABNORMAL LOW (ref >60)
Glucose, Bld: 161 mg/dL — ABNORMAL HIGH (ref 70–99)
Phosphorus: 8.7 mg/dL — ABNORMAL HIGH (ref 2.5–4.6)
Potassium: 4.6 mmol/L (ref 3.5–5.1)
Sodium: 135 mmol/L (ref 135–145)

## 2019-05-18 LAB — CULTURE, RESPIRATORY W GRAM STAIN: Culture: NORMAL

## 2019-05-18 LAB — COOXEMETRY PANEL
Carboxyhemoglobin: 2.2 % — ABNORMAL HIGH (ref 0.5–1.5)
Methemoglobin: 0.7 % (ref 0.0–1.5)
O2 Saturation: 68.3 %
Total hemoglobin: 10.8 g/dL — ABNORMAL LOW (ref 12.0–16.0)

## 2019-05-18 LAB — MAGNESIUM: Magnesium: 2.4 mg/dL (ref 1.7–2.4)

## 2019-05-18 MED ORDER — SEVELAMER CARBONATE 800 MG PO TABS
800.0000 mg | ORAL_TABLET | Freq: Three times a day (TID) | ORAL | Status: DC
  Administered 2019-05-18 – 2019-05-19 (×6): 800 mg via ORAL
  Filled 2019-05-18 (×6): qty 1

## 2019-05-18 MED ORDER — CHLORHEXIDINE GLUCONATE CLOTH 2 % EX PADS
6.0000 | MEDICATED_PAD | Freq: Every day | CUTANEOUS | Status: DC
  Administered 2019-05-19 – 2019-06-09 (×22): 6 via TOPICAL

## 2019-05-18 NOTE — Progress Notes (Signed)
      [address]       [place] [address]             [phone]                 16 Days Post-Op Procedure(s) (LRB): VIDEO BRONCHOSCOPY USING DISPOSABLE ANESTHESIA SCOPE (N/A) TRACHEOSTOMY (N/A)   Events: Tolerated TC 48hrs Remains on levo Tolerated HD _______________________________________________________________ Vitals: BP (!) 100/55 (BP Location: Left Arm)   Pulse 67   Temp (!) 96.9 F (36.1 C) (Axillary)   Resp 16   Ht 5\' 11"  (1.803 m) Comment: measured x 3  Wt 55.6 kg   SpO2 98%   BMI 17.10 kg/m   - Neuro: alert NAD  - Cardiovascular: sinus  Drips: levo.  2 CVP:  [0 mmHg-13 mmHg] 13 mmHg  - Pulm: on TC.  Coarse BS FiO2 (%):  [35 %] 35 %  ABG    Component Value Date/Time   PHART 7.369 05/15/2019 0443   PCO2ART 47.1 05/15/2019 0443   PO2ART 69 (L) 05/15/2019 0443   HCO3 27.3 05/15/2019 0443   TCO2 29 05/15/2019 0443   ACIDBASEDEF 3.0 (H) 04/28/2019 0735   O2SAT 68.3 05/18/2019 0336      .Intake/Output      05/01 0701 - 05/02 0700 05/02 0701 - 05/03 0700   I.V. (mL/kg) 118.7 (2.1) 2.8 (0.1)   NG/GT 1260 55   IV Piggyback 14.2    Total Intake(mL/kg) 1392.9 (25.1) 57.8 (1)   Other 987    Total Output 987    Net +405.9 +57.8           _______________________________________________________________ Labs: CBC Latest Ref Rng & Units 05/18/2019 05/17/2019 05/17/2019  WBC 4.0 - 10.5 K/uL 11.5(H) 18.4(H) 17.6(H)  Hemoglobin 13.0 - 17.0 g/dL 9.6(L) 10.5(L) 11.0(L)  Hematocrit 39.0 - 52.0 % 31.5(L) 34.3(L) 35.2(L)  Platelets 150 - 400 K/uL 151 219 212   CMP Latest Ref Rng & Units 05/18/2019 05/17/2019 05/17/2019  Glucose 70 - 99 mg/dL 161(H) 199(H) 159(H)  BUN 8 - 23 mg/dL 75(H) 41(H) 127(H)  Creatinine 0.61 - 1.24 mg/dL 2.64(H) 1.72(H) 3.49(H)  Sodium 135 - 145 mmol/L 135 136 134(L)  Potassium 3.5 - 5.1 mmol/L 4.6 4.2 5.9(H)  Chloride 98 - 111 mmol/L 95(L) 98 95(L)  CO2 22 - 32 mmol/L 28 27 23   Calcium 8.9 - 10.3 mg/dL 8.1(L) 8.1(L)  8.7(L)  Total Protein 6.5 - 8.1 g/dL - - -  Total Bilirubin 0.3 - 1.2 mg/dL - - -  Alkaline Phos 38 - 126 U/L - - -  AST 15 - 41 U/L - - -  ALT 0 - 44 U/L - - -    CXR: stable  _______________________________________________________________  Assessment and Plan:  s/p CABG, MVR, impella placement on 4/6, impella removed 4/9.  Tracheostomy 4/16  Neuro: pain better controlled CV: labile BP, but tolerated HD Pulm: on TC 48hrs will continue Renal: HD yesterday, removed 1L.  Remains on levo GI: on TF Heme: stable ID: afebrile, on fluconazole Dispo: continue ICU care  Melodie Bouillon, MD 05/18/2019 10:09 AM

## 2019-05-18 NOTE — Progress Notes (Signed)
     [address]       ,[place] [address]             [phone]       No events Remains on TC Remains on low dose levo  Arihant Pennings O Nichol Ator

## 2019-05-18 NOTE — Progress Notes (Signed)
[facility] Progress Note:   55M dialysis dependent AKI; cardiogenic shock status post CABG + MV replacement 4/2  S: Had HD on 5/1 with 1 kg UF.  Levo is still on - down to 4 mcg/min.  Spoke with patient's family yesterday at bedside on afternoon rounds.  Did well on trach collar overnight.  Still needs suctioning but less secretions than the night before per nursing.  Review of systems:    States breathing doing ok   Generalized pain is present but better  Mild nausea    O: 05/01 0701 - 05/02 0700 In: 1276.2 [I.V.:112; NG/GT:1150; IV Piggyback:14.2] Out: 987   Filed Weights   05/17/19 0715 05/17/19 1100 05/18/19 0500  Weight: 56.1 kg 55.5 kg 55.6 kg    Physical exam:   General adult male in bed critically ill; sleeping on arrival and wakes to voice HEENT normocephalic atraumatic extraocular movements intact sclera anicteric Lungs coarse mechanical breath sounds   Heart S1S2 on levo  Abdomen soft nontender nondistended Extremities 1+ pedal edema  Neuro - awake and interactive follows commands Psych no anxiety or agitation Access: left subclavian temporary HD catheter   Recent Labs  Lab 05/16/19 0356 05/16/19 0356 05/17/19 0329 05/17/19 0329 05/17/19 0721 05/17/19 1320 05/18/19 0336  NA 134*   < > 134*   < > 134* 136 135  K 4.4   < > 6.2*   < > 5.9* 4.2 4.6  CL 101   < > 98   < > 95* 98 95*  CO2 24   < > 23   < > _0 GLUCOSE 153*   < > 160*   < > 159* 199* 161*  BUN 69*   < > 119*   < > 127* 41* 75*  CREATININE 1.88*   < > 3.36*   < > 3.49* 1.72* 2.64*  CALCIUM 8.4*   < > 8.5*   < > 8.7* 8.1* 8.1*  PHOS 4.8*  --  8.2*  --   --   --  8.7*   < > = values in this interval not displayed.   Recent Labs  Lab 05/17/19 0653 05/17/19 0721 05/18/19 0336  WBC 17.6* 18.4* 11.5*  HGB 11.0* 10.5* 9.6*  HCT 35.2* 34.3* 31.5*  MCV 98.9 98.0 98.7  PLT 212 219 151    Scheduled Meds: . aspirin EC  325 mg Oral Daily   Or  . aspirin  324 mg Per Tube Daily  .  B-complex with vitamin C  1 tablet Per Tube Daily  . chlorhexidine gluconate (MEDLINE KIT)  15 mL Mouth Rinse BID  . Chlorhexidine Gluconate Cloth  6 each Topical Daily  . Chlorhexidine Gluconate Cloth  6 each Topical Q0600  . docusate  200 mg Per Tube Daily  . feeding supplement (PRO-STAT SUGAR FREE 64)  30 mL Oral TID  . fluconazole  200 mg Per Tube Daily  . insulin aspart  0-24 Units Subcutaneous Q4H  . insulin aspart  2 Units Subcutaneous Q4H  . insulin glargine  14 Units Subcutaneous BID  . mouth rinse  15 mL Mouth Rinse 10 times per day  . metoCLOPramide (REGLAN) injection  5 mg Intravenous Q8H  . midodrine  15 mg Per Tube Q8H  . neomycin-polymyxin-hydrocortisone  3 drop Left EAR Q12H  . oxyCODONE  10 mg Per Tube Q6H  . pantoprazole sodium  40 mg Per Tube BID  . pneumococcal 23 valent vaccine  0.5 mL Intramuscular  Tomorrow-1000  . sodium chloride flush  10-40 mL Intracatheter Q12H   Continuous Infusions: . feeding supplement (VITAL 1.5 CAL) 1,000 mL (05/17/19 2215)  . heparin 10,000 units/ 20 mL infusion syringe 1,000 Units/hr (05/15/19 0624)  . lactated ringers Stopped (05/14/2019 1640)  . lactated ringers Stopped (04/30/19 0930)  . norepinephrine (LEVOPHED) Adult infusion 4 mcg/min (05/18/19 0500)   PRN Meds:.Gerhardt's butt cream, heparin, levalbuterol, ondansetron (ZOFRAN) IV, ondansetron (ZOFRAN) IV, oxyCODONE, sodium chloride flush  ABG    Component Value Date/Time   PHART 7.369 05/15/2019 0443   PCO2ART 47.1 05/15/2019 0443   PO2ART 69 (L) 05/15/2019 0443   HCO3 27.3 05/15/2019 0443   TCO2 29 05/15/2019 0443   ACIDBASEDEF 3.0 (H) 04/28/2019 0735   O2SAT 68.3 05/18/2019 0336    A/P  1. Dialysis dependent AKI, anuric.  CRRT 4/10 - 4/29.  Left subclavian temp HD catheter on 4/13 with CTS.  First HD on 5/1 1. Plan for next HD on 5/3.   2. Continue to assess need for transition back to CRRT - does have a low levo requirement still   3. Hopefully for now is easier to  mobilize pt off of CRRT 2. Enterococcus faecalis pneumonia; per primary and CCM 3. Acute hypoxic respiratory failure s/p trach 4/16 4. Cardiogenic shock on levo and midodrine per primary   5. CAD status post CABG 6. Mitral regurgitation status post mitral valve repair 7. diabetes mellitus 2 - per primary  8. Acute blood loss anemia - no acute indication for PRBC's 9. Hyperphosphatemia - start renvela and anticipate need to titrate  Please transition to nepro feeds.  Hx hypophos with CRRT previously 10. Recurrent polymorphic ventricular tachycardia, s/p amiodarone     C  05/18/2019  5:40 AM   

## 2019-05-19 ENCOUNTER — Encounter (HOSPITAL_COMMUNITY): Payer: Self-pay | Admitting: Cardiothoracic Surgery

## 2019-05-19 ENCOUNTER — Inpatient Hospital Stay (HOSPITAL_COMMUNITY): Payer: Medicaid Other

## 2019-05-19 HISTORY — PX: IR US GUIDE VASC ACCESS LEFT: IMG2389

## 2019-05-19 HISTORY — PX: IR FLUORO GUIDE CV LINE LEFT: IMG2282

## 2019-05-19 LAB — CBC
HCT: 31.7 % — ABNORMAL LOW (ref 39.0–52.0)
Hemoglobin: 9.7 g/dL — ABNORMAL LOW (ref 13.0–17.0)
MCH: 30.7 pg (ref 26.0–34.0)
MCHC: 30.6 g/dL (ref 30.0–36.0)
MCV: 100.3 fL — ABNORMAL HIGH (ref 80.0–100.0)
Platelets: 138 10*3/uL — ABNORMAL LOW (ref 150–400)
RBC: 3.16 MIL/uL — ABNORMAL LOW (ref 4.22–5.81)
RDW: 17.9 % — ABNORMAL HIGH (ref 11.5–15.5)
WBC: 14.1 10*3/uL — ABNORMAL HIGH (ref 4.0–10.5)
nRBC: 0 % (ref 0.0–0.2)

## 2019-05-19 LAB — RENAL FUNCTION PANEL
Albumin: 2.1 g/dL — ABNORMAL LOW (ref 3.5–5.0)
Anion gap: 16 — ABNORMAL HIGH (ref 5–15)
BUN: 117 mg/dL — ABNORMAL HIGH (ref 8–23)
CO2: 26 mmol/L (ref 22–32)
Calcium: 8.2 mg/dL — ABNORMAL LOW (ref 8.9–10.3)
Chloride: 90 mmol/L — ABNORMAL LOW (ref 98–111)
Creatinine, Ser: 3.77 mg/dL — ABNORMAL HIGH (ref 0.61–1.24)
GFR calc Af Amer: 19 mL/min — ABNORMAL LOW (ref >60)
GFR calc non Af Amer: 16 mL/min — ABNORMAL LOW (ref >60)
Glucose, Bld: 250 mg/dL — ABNORMAL HIGH (ref 70–99)
Phosphorus: >30 mg/dL — ABNORMAL HIGH (ref 2.5–4.6)
Potassium: 6.4 mmol/L (ref 3.5–5.1)
Sodium: 132 mmol/L — ABNORMAL LOW (ref 135–145)

## 2019-05-19 LAB — HEPATITIS B E ANTIBODY: Hep B E Ab: NEGATIVE

## 2019-05-19 LAB — POCT I-STAT, CHEM 8
BUN: 35 mg/dL — ABNORMAL HIGH (ref 8–23)
Calcium, Ion: 1.13 mmol/L — ABNORMAL LOW (ref 1.15–1.40)
Chloride: 95 mmol/L — ABNORMAL LOW (ref 98–111)
Creatinine, Ser: 1.4 mg/dL — ABNORMAL HIGH (ref 0.61–1.24)
Glucose, Bld: 150 mg/dL — ABNORMAL HIGH (ref 70–99)
HCT: 31 % — ABNORMAL LOW (ref 39.0–52.0)
Hemoglobin: 10.5 g/dL — ABNORMAL LOW (ref 13.0–17.0)
Potassium: 3.9 mmol/L (ref 3.5–5.1)
Sodium: 134 mmol/L — ABNORMAL LOW (ref 135–145)
TCO2: 32 mmol/L (ref 22–32)

## 2019-05-19 LAB — GLUCOSE, CAPILLARY
Glucose-Capillary: 225 mg/dL — ABNORMAL HIGH (ref 70–99)
Glucose-Capillary: 212 mg/dL — ABNORMAL HIGH (ref 70–99)
Glucose-Capillary: 153 mg/dL — ABNORMAL HIGH (ref 70–99)
Glucose-Capillary: 58 mg/dL — ABNORMAL LOW (ref 70–99)
Glucose-Capillary: 72 mg/dL (ref 70–99)
Glucose-Capillary: 85 mg/dL (ref 70–99)
Glucose-Capillary: 152 mg/dL — ABNORMAL HIGH (ref 70–99)

## 2019-05-19 LAB — COOXEMETRY PANEL
Carboxyhemoglobin: 2.1 % — ABNORMAL HIGH (ref 0.5–1.5)
Methemoglobin: 0.6 % (ref 0.0–1.5)
O2 Saturation: 64.5 %
Total hemoglobin: 9.5 g/dL — ABNORMAL LOW (ref 12.0–16.0)

## 2019-05-19 LAB — MAGNESIUM: Magnesium: 2.7 mg/dL — ABNORMAL HIGH (ref 1.7–2.4)

## 2019-05-19 MED ORDER — DEXTROSE 50 % IV SOLN
1.0000 | Freq: Once | INTRAVENOUS | Status: AC
  Administered 2019-05-19: 50 mL via INTRAVENOUS
  Filled 2019-05-19: qty 50

## 2019-05-19 MED ORDER — HEPARIN SODIUM (PORCINE) 1000 UNIT/ML IJ SOLN
INTRAMUSCULAR | Status: AC
  Administered 2019-05-19: 1000 [IU] via INTRAVENOUS_CENTRAL
  Filled 2019-05-19: qty 4

## 2019-05-19 MED ORDER — PRO-STAT SUGAR FREE PO LIQD
30.0000 mL | Freq: Two times a day (BID) | ORAL | Status: DC
  Administered 2019-05-19 – 2019-05-24 (×11): 30 mL via ORAL
  Filled 2019-05-19 (×12): qty 30

## 2019-05-19 MED ORDER — LIDOCAINE HCL (PF) 1 % IJ SOLN
INTRAMUSCULAR | Status: AC | PRN
  Administered 2019-05-19: 5 mL

## 2019-05-19 MED ORDER — LIDOCAINE HCL 1 % IJ SOLN
INTRAMUSCULAR | Status: AC
  Filled 2019-05-19: qty 20

## 2019-05-19 MED ORDER — INSULIN ASPART 100 UNIT/ML ~~LOC~~ SOLN
5.0000 [IU] | Freq: Once | SUBCUTANEOUS | Status: AC
  Administered 2019-05-19: 5 [IU] via INTRAVENOUS

## 2019-05-19 MED ORDER — CEFAZOLIN SODIUM-DEXTROSE 2-4 GM/100ML-% IV SOLN
INTRAVENOUS | Status: AC
  Filled 2019-05-19: qty 100

## 2019-05-19 MED ORDER — NEPRO/CARBSTEADY PO LIQD
1000.0000 mL | ORAL | Status: DC
  Administered 2019-05-19 – 2019-05-21 (×3): 1000 mL

## 2019-05-19 MED ORDER — CHLORHEXIDINE GLUCONATE 4 % EX LIQD
CUTANEOUS | Status: AC
  Filled 2019-05-19: qty 15

## 2019-05-19 MED ORDER — DEXTROSE 50 % IV SOLN
INTRAVENOUS | Status: AC
  Administered 2019-05-19: 25 mL
  Filled 2019-05-19: qty 50

## 2019-05-19 MED ORDER — NEPRO/CARBSTEADY PO LIQD: 1000.0000 mL | ORAL | Status: DC

## 2019-05-19 MED ORDER — MIDAZOLAM HCL 2 MG/2ML IJ SOLN
INTRAMUSCULAR | Status: AC | PRN
  Administered 2019-05-19: 0.5 mg via INTRAVENOUS

## 2019-05-19 MED ORDER — CEFAZOLIN SODIUM-DEXTROSE 2-4 GM/100ML-% IV SOLN
2.0000 g | Freq: Once | INTRAVENOUS | Status: AC
  Administered 2019-05-19: 2 g via INTRAVENOUS
  Filled 2019-05-19: qty 100

## 2019-05-19 MED ORDER — DIPHENHYDRAMINE HCL 12.5 MG/5ML PO ELIX
12.5000 mg | ORAL_SOLUTION | Freq: Four times a day (QID) | ORAL | Status: DC | PRN
  Administered 2019-05-19 – 2019-05-26 (×4): 12.5 mg
  Filled 2019-05-19 (×4): qty 5

## 2019-05-19 MED ORDER — FENTANYL CITRATE (PF) 100 MCG/2ML IJ SOLN
INTRAMUSCULAR | Status: AC
  Filled 2019-05-19: qty 2

## 2019-05-19 MED ORDER — INSULIN GLARGINE 100 UNIT/ML ~~LOC~~ SOLN
16.0000 [IU] | Freq: Two times a day (BID) | SUBCUTANEOUS | Status: DC
  Administered 2019-05-19 – 2019-05-21 (×6): 16 [IU] via SUBCUTANEOUS
  Filled 2019-05-19 (×8): qty 0.16

## 2019-05-19 MED ORDER — FENTANYL CITRATE (PF) 100 MCG/2ML IJ SOLN
INTRAMUSCULAR | Status: AC | PRN
  Administered 2019-05-19: 25 ug via INTRAVENOUS

## 2019-05-19 MED ORDER — ALBUMIN HUMAN 25 % IV SOLN
INTRAVENOUS | Status: AC
  Administered 2019-05-19: 25 g
  Filled 2019-05-19: qty 100

## 2019-05-19 MED ORDER — HEPARIN SODIUM (PORCINE) 1000 UNIT/ML IJ SOLN
INTRAMUSCULAR | Status: AC
  Filled 2019-05-19: qty 1

## 2019-05-19 MED ORDER — SODIUM ZIRCONIUM CYCLOSILICATE 10 G PO PACK
10.0000 g | PACK | Freq: Once | ORAL | Status: AC
  Administered 2019-05-19: 10 g
  Filled 2019-05-19: qty 1

## 2019-05-19 MED ORDER — MIDAZOLAM HCL 2 MG/2ML IJ SOLN
INTRAMUSCULAR | Status: AC
  Filled 2019-05-19: qty 2

## 2019-05-19 NOTE — Progress Notes (Signed)
  Speech Language Pathology Treatment: Brendan [name] Speaking valve  Patient Details Name: Brendan [name] MRN: [id number] DOB: May 11, 1955 Today's Date: 05/19/2019 Time: 0102-7253 SLP Time Calculation (min) (ACUTE ONLY): 14 min  Assessment / Plan / Recommendation Clinical Impression  Pt is NPO for HD cath today, only addressed PMSV usage. Pt was alert, but not following commands and minimally attentive to therapist today. He was mostly gently picking at his lines, seemed to have poorly focused attention, severe cognitive impairment. When cuff was deflated pt had only slight expectoration of secretions, otherwise no change. With PMSV off, pt was observed to have long apneic periods between breaths, especially when he was distracted. His eye became glass during these moments. Counted respiratory and pt breathing 12 times a minute though sensor reading 16 and O2 sats 100%. With PMSV in place, pattern continued with slight increase in effort. Pt did not initiate phonation or any communication other than one head nod to a Y/N question. PT not really making progress today. His secretions have improved, but his mentation is altered and respirations very slow. Will f/u for further efforts.   HPI        SLP Plan  Continue with current plan of care       Recommendations  Diet recommendations: NPO      Patient may use Passy-Muir Speech Valve: Intermittently with supervision PMSV Supervision: Full         Follow up Recommendations: Skilled Nursing facility Plan: Continue with current plan of care       GO               Herbie Baltimore, MA [place] Pager [phone] Office [phone]  Lynann Beaver 05/19/2019, 12:55 PM

## 2019-05-19 NOTE — Procedures (Signed)
  Procedure: L IJ tunneled HD catheter placement Palindrome 23 EBL:   minimal Complications:  none immediate  See full dictation in [facility].  Dillard Cannon MD Main # [phone] Pager  [phone]

## 2019-05-19 NOTE — Consult Note (Signed)
Chief Complaint: Patient was seen in consultation today for renal failure  Referring Physician(s): Dr. Royce Macadamia  Supervising Physician: Arne Cleveland  Patient Status: [facility] - In-pt  History of Present Illness: Brendan [name] is a 64 y.o. male with past medical history of HTN, poorly controlled DM, diabetic neuropathy, found to have HFrEF, NSTEMI transferred to [facility] from OSH for further cardiac work-up.  He was found to have 3-vessel CAD complicated by worsening hypoxia s/p cardiac cath 4/1.  He subsequently required Impella device placement 4/2, underwent CABG and MVR 4/6.  The Impella has since been removed.  He has continued with respiratory distress reintubated 4/15, s/p trach 4/16. He has also required dialysis due to acute kidney injury, now dialysis dependent, currently undergoing HD via left subclavian temp cath.   Past Medical History:  Diagnosis Date  . Cholecystitis 03/2013  . Complication of anesthesia   . Diabetes mellitus without complication ([place])   . Erectile dysfunction 2010  . HTN (hypertension)   . NSTEMI (non-ST elevated myocardial infarction) ([place]) 04/04/2019  . PONV (postoperative nausea and vomiting)     Past Surgical History:  Procedure Laterality Date  . APPENDECTOMY  1969  . BRONCHIAL WASHINGS N/A 05/08/2019   Procedure: Bronchial Washings;  Surgeon: Ivin Poot, MD;  Location: [place];  Service: Open Heart Surgery;  Laterality: N/A;  . CHOLECYSTECTOMY    . CHOLECYSTECTOMY N/A 04/02/2013   Procedure: LAPAROSCOPIC CHOLECYSTECTOMY WITH  INTRAOPERATIVE CHOLANGIOGRAM;  Surgeon: Joyice Faster. Cornett, MD;  Location: [place];  Service: General;  Laterality: N/A;  . CORONARY ARTERY BYPASS GRAFT N/A 05/14/2019   Procedure: CORONARY ARTERY BYPASS GRAFTING (CABG) x3, USING LEFT INTERNAL MAMMARY ARTERY AND LEFT LEG GREATER SAPHENOUS VEIN HARVESTED ENDOSCOPICALLY;  Surgeon: Ivin Poot, MD;  Location: [place];  Service: Open Heart Surgery;  Laterality: N/A;  Inhaled  Nitric-Oxide  . ERCP N/A 04/02/2013   Procedure: ENDOSCOPIC RETROGRADE CHOLANGIOPANCREATOGRAPHY (ERCP);  Surgeon: Inda Castle, MD;  Location: [place];  Service: Endoscopy;  Laterality: N/A;  . 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   . IABP INSERTION N/A 05/16/2019   Procedure: IABP INSERTION;  Surgeon: Jolaine Artist, MD;  Location: [place] [facility];  Service: Cardiovascular;  Laterality: N/A;  . MITRAL VALVE REPLACEMENT N/A 04/21/2019   Procedure: MITRAL VALVE (MV) REPAIR USING PHYSIO II RING SIZE 26MM;  Surgeon: Prescott Gum, Collier Salina, MD;  Location: [place];  Service: Open Heart Surgery;  Laterality: N/A;  . PLACEMENT OF IMPELLA LEFT VENTRICULAR ASSIST DEVICE Right 04/26/2019   Procedure: Placement of Impella 5.5 Direct;  Surgeon: Ivin Poot, MD;  Location: [place];  Service: Open Heart Surgery;  Laterality: Right;  . REMOVAL OF IMPELLA LEFT VENTRICULAR ASSIST DEVICE N/A 05/03/2019   Procedure: REMOVAL OF IMPELLA 5.5 direct, LEFT VENTRICULAR ASSIST [place], TEE;  Surgeon: Ivin Poot, MD;  Location: [place];  Service: Open Heart Surgery;  Laterality: N/A;  . RIGHT HEART CATH N/A 05/14/2019   Procedure: RIGHT HEART CATH;  Surgeon: Jolaine Artist, MD;  Location: [place] [facility];  Service: Cardiovascular;  Laterality: N/A;  . RIGHT/LEFT HEART CATH AND CORONARY ANGIOGRAPHY N/A 03/21/2019   Procedure: RIGHT/LEFT HEART CATH AND CORONARY ANGIOGRAPHY;  Surgeon: Martinique, Peter M, MD;  Location: [place] [facility];  Service: Cardiovascular;  Laterality: N/A;  . TEE WITHOUT CARDIOVERSION N/A 05/10/2019   Procedure: TRANSESOPHAGEAL ECHOCARDIOGRAM (TEE);  Surgeon: Prescott Gum, Collier Salina,  MD;  Location: [facility];  Service: Open Heart Surgery;  Laterality: N/A;  . TEE WITHOUT CARDIOVERSION N/A 05/15/2019   Procedure: TRANSESOPHAGEAL ECHOCARDIOGRAM (TEE);  Surgeon: Prescott Gum, Collier Salina, MD;  Location: [place];  Service: Open Heart Surgery;  Laterality:  N/A;  . TRACHEOSTOMY TUBE PLACEMENT N/A 04/28/2019   Procedure: TRACHEOSTOMY;  Surgeon: Ivin Poot, MD;  Location: [place];  Service: Thoracic;  Laterality: N/A;  . VIDEO BRONCHOSCOPY N/A 05/14/2019   Procedure: VIDEO BRONCHOSCOPY USING DISPOSABLE ANESTHESIA SCOPE;  Surgeon: Ivin Poot, MD;  Location: [place];  Service: Thoracic;  Laterality: N/A;    Allergies: Acetaminophen  Medications: Prior to Admission medications   Medication Sig Start Date End Date Taking? Authorizing Provider  Insulin Glargine (BASAGLAR KWIKPEN) 100 UNIT/ML Inject 28 Units into the skin at bedtime.   Yes [provider]  insulin lispro (HUMALOG) 100 UNIT/ML injection Inject 8 Units into the skin 2 (two) times daily before a meal.   Yes [provider]  metFORMIN (GLUCOPHAGE) 500 MG tablet Take 1 tablet (500 mg total) by mouth 2 (two) times daily with a meal. 06/07/17  Yes Hensel, Jamal Collin, MD  glucose blood (RELION GLUCOSE TEST STRIPS) test strip Use as instructed 02/24/19   [name], MD  Insulin Pen Needle (PEN NEEDLES) 32G X 4 MM MISC     [provider]  ReliOn Ultra Thin Lancets MISC Use to test blood sugar up to 4 times daily. 07/28/13   Coral Spikes, DO     Family History  Problem Relation Age of Onset  . Colon cancer Maternal Aunt   . Diabetes Maternal Grandmother   . Stroke Maternal Grandmother     Social History   Socioeconomic History  . Marital status: Married    Spouse name: Cecille Rubin  . Number of children: Not on file  . Years of education: Not on file  . Highest education level: Not on file  Occupational History  . Not on file  Tobacco Use  . Smoking status: Never Smoker  . Smokeless tobacco: Current User    Types: Snuff, Chew  Substance and Sexual Activity  . Alcohol use: Not Currently    Comment: occ  . Drug use: No  . Sexual activity: Yes  Other Topics Concern  . Not on file  Social History Narrative  . Not on file   Social Determinants of  Health   Financial Resource Strain:   . Difficulty of Paying Living Expenses:   Food Insecurity:   . Worried About Charity fundraiser in the Last Year:   . Arboriculturist in the Last Year:   Transportation Needs:   . Film/video editor (Medical):   Marland Kitchen Lack of Transportation (Non-Medical):   Physical Activity:   . Days of Exercise per Week:   . Minutes of Exercise per Session:   Stress:   . Feeling of Stress :   Social Connections:   . Frequency of Communication with Friends and Family:   . Frequency of Social Gatherings with Friends and Family:   . Attends Religious Services:   . Active Member of Clubs or Organizations:   . Attends Archivist Meetings:   Marland Kitchen Marital Status:      Review of Systems: A 12 point ROS discussed and pertinent positives are indicated in the HPI above.  All other systems are negative.  Review of Systems  Constitutional: Negative for fatigue and fever.  Respiratory: Negative for  cough and shortness of breath.   Cardiovascular: Negative for chest pain.  Gastrointestinal: Negative for abdominal pain.  Musculoskeletal: Negative for back pain.  Psychiatric/Behavioral: Negative for behavioral problems and confusion.    Vital Signs: BP 118/67   Pulse 73   Temp 98.6 F (37 C)   Resp 12   Ht 5\' 11"  (1.803 m) Comment: measured x 3  Wt 126 lb 5.2 oz (57.3 kg)   SpO2 99%   BMI 17.62 kg/m   Physical Exam Vitals and nursing note reviewed.  Constitutional:      General: He is not in acute distress.    Appearance: He is not ill-appearing.  HENT:     Mouth/Throat:     Mouth: Mucous membranes are moist.     Pharynx: Oropharynx is clear.  Cardiovascular:     Rate and Rhythm: Brendan rate and regular rhythm.  Pulmonary:     Effort: Pulmonary effort is Brendan. No respiratory distress.     Breath sounds: Brendan breath sounds.  Skin:    General: Skin is warm and dry.  Neurological:     General: No focal deficit present.     Mental  Status: He is alert and oriented to person, place, and time. Mental status is at baseline.  Psychiatric:        Mood and Affect: Mood Brendan.        Behavior: Behavior Brendan.        Thought Content: Thought content Brendan.        Judgment: Judgment Brendan.      MD Evaluation Airway: WNL Heart: Other (comments) Heart  comments: EF 25% Abdomen: WNL Chest/ Lungs: WNL ASA  Classification: 3 Mallampati/Airway Score: Two   Imaging: US RENAL  Result Date: 04/27/2019 CLINICAL DATA:  Acute renal insufficiency. EXAM: RENAL / URINARY TRACT ULTRASOUND COMPLETE COMPARISON:  None. FINDINGS: Right Kidney: Renal measurements: 12.1 x 5.9 x 7.2 cm = volume: 269 mL . Echogenicity within Brendan limits. No mass or hydronephrosis visualized. Left Kidney: Renal measurements: 12.6 x 6.2 x 6.2 cm = volume: 253 mL. Echogenicity within Brendan limits. No mass or hydronephrosis visualized. Bladder: Foley catheter present within a decompressed bladder. Other: Trace fluid adjacent the spleen. IMPRESSION: Brendan size kidneys without hydronephrosis. Electronically Signed   By: Marin Olp M.D.   On: 04/27/2019 15:45   DG Chest Port 1 View  Result Date: 05/18/2019 CLINICAL DATA:  Mitral valve replacement. EXAM: PORTABLE CHEST 1 VIEW COMPARISON:  05/16/2019 FINDINGS: Tracheostomy tube in adequate position. Enteric tube courses into the stomach and off the film as tip is not visualized. Left subclavian central venous catheter unchanged with tip at the level of the cavoatrial junction. Right-sided PICC line present with tip at the level of the cavoatrial junction. Lungs are adequately inflated demonstrating persistent moderate hazy airspace opacification most prominent over the central/perihilar regions. Findings may be due to moderate interstitial edema or infection. No definite effusion or pneumothorax. Cardiomediastinal silhouette and remainder of the exam is unchanged. IMPRESSION: 1. Stable bilateral hazy airspace  process most prominent over the central lungs/perihilar regions as findings may be due to interstitial edema or infection. 2.  Tubes and lines as described. Electronically Signed   By: Marin Olp M.D.   On: 05/18/2019 09:02   DG CHEST PORT 1 VIEW  Result Date: 05/16/2019 CLINICAL DATA:  Respiratory distress EXAM: PORTABLE CHEST 1 VIEW COMPARISON:  May 14, 2019 and multiple recent prior studies FINDINGS: Tracheostomy tip is 5.6 cm above the carina.  Left central catheter tip is in the superior vena cava. Right central catheter tip is at the cavoatrial junction. Feeding tube tip and side port are below the diaphragm. No pneumothorax. There is widespread alveolar opacity with suspected degree of ARDS. There may be a degree of pulmonary edema and/or pneumonia superimposed. Heart is upper Brendan in size with pulmonary vascularity Brendan. Postoperative changes are stable. There is aortic atherosclerosis. No bone lesions. IMPRESSION: Tube and catheter positions as described without pneumothorax. Suspect underlying ARDS. A degree of edema and/or pneumonia cannot be excluded in this circumstance. Stable cardiac silhouette and postoperative changes. Aortic Atherosclerosis (ICD10-I70.0). Electronically Signed   By: Lowella Grip III M.D.   On: 05/16/2019 09:00   DG Chest Port 1 View  Result Date: 05/14/2019 CLINICAL DATA:  Postop from CABG. EXAM: PORTABLE CHEST 1 VIEW COMPARISON:  05/13/2019 FINDINGS: Support lines and tubes in appropriate position. No pneumothorax visualized. Bilateral diffuse pulmonary airspace disease shows interval improvement since previous study. Heart size is stable. IMPRESSION: Decreased bilateral diffuse pulmonary airspace disease since prior exam. Electronically Signed   By: Marlaine Hind M.D.   On: 05/14/2019 09:16   DG Chest Port 1 View  Result Date: 05/13/2019 CLINICAL DATA:  Weakness. EXAM: PORTABLE CHEST 1 VIEW COMPARISON:  Yesterday FINDINGS: Tracheostomy tube in place. The  feeding tube at least reaches the stomach. Right central line and left-sided dialysis catheter in stable position. Extensive interstitial and airspace opacity is unchanged. CABG and mitral valve replacement. Stable heart size. No visible pneumothorax IMPRESSION: Stable hardware positioning and severe airspace disease. Electronically Signed   By: Monte Fantasia M.D.   On: 05/13/2019 08:45   DG CHEST PORT 1 VIEW  Result Date: 05/12/2019 CLINICAL DATA:  BILATERAL pleural effusions, pneumonia, hypertension, type II diabetes mellitus, history CHF, NSTEMI, coronary artery disease post CABG EXAM: PORTABLE CHEST 1 VIEW COMPARISON:  Portable exam 0521 hours compared to 05/10/2019 FINDINGS: Tracheostomy tube and LEFT jugular dual-lumen central venous catheter unchanged. Tip of RIGHT arm PICC line projects over RIGHT atrium. Epicardial pacing wires noted. Stable heart size post CABG and MVR. Diffuse BILATERAL pulmonary infiltrates which could represent pulmonary edema and/or multifocal pneumonia, slightly increased. No pleural effusion or pneumothorax. IMPRESSION: BILATERAL pulmonary infiltrates question pulmonary edema and/or multifocal pneumonia, slightly increased from previous exam. Electronically Signed   By: Lavonia Dana M.D.   On: 05/12/2019 08:15   DG CHEST PORT 1 VIEW  Result Date: 05/10/2019 CLINICAL DATA:  Pleural effusion and pneumonia. EXAM: PORTABLE CHEST 1 VIEW COMPARISON:  May 09, 2019 FINDINGS: Stable left central line. The feeding tube terminates below today's film. Stable right PICC line. Tracheostomy tube remains in place. No pneumothorax. Diffuse bilateral pulmonary infiltrates remain, similar in the interval. Stable cardiomediastinal silhouette. IMPRESSION: 1. Support apparatus as above. 2. Diffuse bilateral pulmonary infiltrates are centrally stable. Electronically Signed   By: Dorise Bullion III M.D   On: 05/10/2019 11:58   DG CHEST PORT 1 VIEW  Result Date: 05/09/2019 CLINICAL DATA:   Pneumonia. EXAM: PORTABLE CHEST 1 VIEW COMPARISON:  05/08/2019 FINDINGS: The tracheostomy tube, feeding tube and left subclavian catheters are stable. The right PICC line is stable. Persistent diffuse interstitial and airspace process in the lungs. No significant change. No pneumothorax or pleural effusion. IMPRESSION: Persistent diffuse interstitial and airspace process. Stable support apparatus. Electronically Signed   By: Marijo Sanes M.D.   On: 05/09/2019 08:32   DG CHEST PORT 1 VIEW  Result Date: 05/08/2019 CLINICAL DATA:  Tracheostomy.  Pleural  effusion. EXAM: PORTABLE CHEST 1 VIEW COMPARISON:  05/07/2019. FINDINGS: Tracheostomy tube, feeding tube, right PICC line, left subclavian line in stable position. Prior CABG and cardiac valve replacement. Heart size Brendan. Diffuse bilateral pulmonary infiltrates/edema. No pleural effusion or pneumothorax. Surgical staples right shoulder. IMPRESSION: 1.  Lines and tubes in stable position. 2. Prior CABG. Cardiac pacer in stable position. Heart size stable. 3. Diffuse bilateral pulmonary infiltrates/edema. Similar findings noted on prior exam. Electronically Signed   By: Cas[place]   On: 05/08/2019 06:57   DG Chest Port 1 View  Result Date: 05/07/2019 CLINICAL DATA:  Open heart surgery. EXAM: PORTABLE CHEST 1 VIEW COMPARISON:  05/06/2019 FINDINGS: The tracheostomy tube, feeding tube, right PICC line and left subclavian central venous catheters are stable. Persistent diffuse interstitial and airspace process in the lungs. Slight worsening aeration likely due to lower lung volumes. No definite pleural effusions or pneumothorax. IMPRESSION: 1. Stable support apparatus. 2. Persistent diffuse interstitial and airspace process. Electronically Signed   By: Marijo Sanes M.D.   On: 05/07/2019 08:50   DG CHEST PORT 1 VIEW  Result Date: 05/06/2019 CLINICAL DATA:  Shortness of breath, pleural effusion, trach EXAM: PORTABLE CHEST 1 VIEW COMPARISON:  05/04/2019  FINDINGS: Multifocal patchy opacities, right lung predominant, favoring multifocal pneumonia over interstitial edema. This is grossly unchanged from the prior. No definite pleural effusions. No pneumothorax. Tracheostomy in satisfactory position. The heart is top-Brendan in size. Prosthetic aortic valve. Postsurgical changes related to prior CABG. Left IJ dual lumen catheter terminating at the cavoatrial junction. Enteric tube coursing into the stomach. IMPRESSION: Suspected multifocal pneumonia, grossly unchanged. Stable support apparatus as above. Electronically Signed   By: Julian Hy M.D.   On: 05/06/2019 09:19   DG Chest Port 1 View  Result Date: 05/04/2019 CLINICAL DATA:  Status post mitral valve replacement EXAM: PORTABLE CHEST 1 VIEW COMPARISON:  Yesterday FINDINGS: Tracheostomy tube in satisfactory position. Stable left subclavian double-lumen catheter with its tip at the superior cavoatrial junction. Stable right PICC with its tip at the superior cavoatrial junction. Feeding tube extending into the stomach. Stable post CABG changes and prosthetic heart valve. Mild increase in prominence of the interstitial markings and areas of patchy opacity in the right mid lung zone in both lower lung zones. No visible pleural fluid. Thoracic spine degenerative changes. IMPRESSION: Mild increase in interstitial pulmonary edema and patchy alveolar edema or pneumonia in the right mid lung zone and both lower lung zones. Electronically Signed   By: Claudie Revering M.D.   On: 05/04/2019 10:22   DG Chest Port 1 View  Result Date: 05/03/2019 CLINICAL DATA:  64 year old male with tracheostomy EXAM: PORTABLE CHEST 1 VIEW COMPARISON:  Multiple prior most recent 05/15/2019 FINDINGS: None cardiomediastinal silhouette unchanged in size and contour. Surgical changes of median sternotomy, CABG, mitral valve repair. Tracheostomy unchanged. Enteric feeding tube traverses the mediastinum and terminates out of the field of  view. Left subclavian temporary hemodialysis catheter unchanged. Right upper extremity PICC unchanged. Pacing leads of the upper abdomen/lower chest. Improving aeration of the lungs, with persistent mixed interstitial and airspace opacities. No pneumothorax. No large pleural effusion. Surgical staples of the right upper chest wall IMPRESSION: Persisting bilateral mixed interstitial and airspace opacities, though improved from the comparison chest x-ray, potentially related to inspiratory volume. Surgical changes of median sternotomy, CABG, mitral valve annuloplasty. Unchanged tracheostomy, enteric tube, left subclavian temporary HD catheter, and right upper extremity PICC. Electronically Signed   By: Corrie Mckusick D.O.  On: 05/03/2019 10:13   DG Chest Portable 1 View  Result Date: 05/09/2019 CLINICAL DATA:  Status post bronchoscopy. EXAM: PORTABLE CHEST 1 VIEW COMPARISON:  May 02, 2019. FINDINGS: The heart size and mediastinal contours are within Brendan limits. Tracheostomy tube is in good position. Feeding tube is seen entering stomach. Left subclavian dialysis catheter is unchanged in position. No pneumothorax is noted. No significant pleural effusion is noted. Bilateral lung opacities are noted, right greater than left, concerning for worsening pneumonia or edema. The visualized skeletal structures are unremarkable. IMPRESSION: Stable support apparatus. Bilateral lung opacities are noted, right greater than left, concerning for worsening pneumonia or edema. No pneumothorax is noted. Electronically Signed   By: Marijo Conception M.D.   On: 05/13/2019 13:45   DG CHEST PORT 1 VIEW  Result Date: 04/21/2019 CLINICAL DATA:  Endotracheal tube placement.  Pleural effusion. EXAM: PORTABLE CHEST 1 VIEW COMPARISON:  05/01/2019 FINDINGS: Endotracheal tube has tip approximately 5 cm above the carina. Enteric tube courses through the region of the distal stomach in the right upper quadrant as tip is not definitely  visualized. Right-sided PICC line and left subclavian central venous catheter unchanged. Lungs are adequately inflated and demonstrate hazy bilateral perihilar opacification without significant change as well as mild opacification in the right base. Findings may be due to interstitial edema versus infection. No evidence of effusion. Remainder of the exam is unchanged. IMPRESSION: 1. Continued hazy opacification over the perihilar region and right base which may be due to interstitial edema versus infection. 2.  Tubes and lines as described without significant change. Electronically Signed   By: Marin Olp M.D.   On: 05/15/2019 08:58   DG CHEST PORT 1 VIEW  Result Date: 05/01/2019 CLINICAL DATA:  Intubation EXAM: PORTABLE CHEST 1 VIEW COMPARISON:  04/30/2019 FINDINGS: Support Apparatus: --Endotracheal tube: Tip at the level of the clavicular heads. --Enteric tube:Tip below the field of view --Catheter(s):Left subclavian catheter tip at the cavoatrial junction. Right PICC line tip also at the cavoatrial junction. --Other: Right chest tube has been removed. Right IJ catheter has been removed. Bibasilar opacities. No pleural effusion or pneumothorax visible. IMPRESSION: Radiographically appropriate position of endotracheal tube. Electronically Signed   By: Ulyses Jarred M.D.   On: 05/01/2019 01:01   DG Chest Port 1 View  Result Date: 04/30/2019 CLINICAL DATA:  Chest tube EXAM: PORTABLE CHEST 1 VIEW COMPARISON:  Yesterday FINDINGS: Endotracheal tube tip is just below the clavicular heads. 3 central lines with tips at the SVC or upper cavoatrial junction. There has been valve repair and CABG. Improving interstitial opacity. Stable right chest tube positioning with no visible pneumothorax. IMPRESSION: 1. Stable hardware positioning. 2. No visible pneumothorax. Electronically Signed   By: Monte Fantasia M.D.   On: 04/30/2019 09:01   DG CHEST PORT 1 VIEW  Result Date: 04/29/2019 CLINICAL DATA:  Dialysis  catheter placement. EXAM: PORTABLE CHEST 1 VIEW COMPARISON:  Chest x-ray from same day at 0516 hours. FINDINGS: New left subclavian dialysis catheter with tip in the proximal right atrium. Interval removal of the Swan-Ganz catheter. The right internal jugular sheath remains in place. Interval removal of the left chest tube. Unchanged endotracheal and feeding tubes. Unchanged right upper extremity PICC line. Unchanged right chest tube. Stable cardiomediastinal silhouette with Brendan heart size status post CABG and mitral valve replacement. Mild diffuse interstitial thickening with patchy airspace disease is similar to prior study. No pleural effusion or pneumothorax. No acute osseous abnormality. IMPRESSION: 1. New left subclavian  dialysis catheter without complicating feature. 2. Interval removal of the left-sided chest tube.  No pneumothorax. 3. Unchanged pulmonary edema. Electronically Signed   By: Titus Dubin M.D.   On: 04/29/2019 11:25   DG Chest Port 1 View  Result Date: 04/29/2019 CLINICAL DATA:  ETT / chest tuber present  Post open heart surg EXAM: PORTABLE CHEST - 1 VIEW COMPARISON:  the previous day's study FINDINGS: Endotracheal tube, feeding tube, right IJ Swan-Ganz, bilateral chest tubes remain in place. Changes of median sternotomy, CABG, valve surgery. No pneumothorax or effusion. Heart size and mediastinal contour Brendan. Interval decrease in the bilateral interstitial edema or infiltrates with mild residual. IMPRESSION: Improving bilateral interstitial edema/infiltrates. Electronically Signed   By: Lucrezia Europe M.D.   On: 04/29/2019 08:53   DG CHEST PORT 1 VIEW  Result Date: 04/28/2019 CLINICAL DATA:  ETT placement EXAM: PORTABLE CHEST 1 VIEW COMPARISON:  04/27/2019, 04/26/2019, 05/06/2019 FINDINGS: Interval intubation, tip of the endotracheal tube is about 2.5 cm superior to the carina. Right IJ Swan-Ganz catheter tip over the pulmonary outflow tract. Esophageal tube tip is below the  diaphragm. Right upper extremity central venous catheter tip over the cavoatrial region. Bilateral chest tubes remain in place. Post sternotomy changes and valve prosthesis. Cardiomegaly with vascular congestion. Continued extensive bilateral interstitial and alveolar disease with some improved aeration in the upper lung zones. Aortic atherosclerosis. No definitive pneumothorax is seen. IMPRESSION: 1. Endotracheal tube tip about 2.5 cm superior to the carina. 2. Cardiomegaly with continued vascular congestion and extensive interstitial and alveolar disease with some improvement in aeration in the upper lung zones. Electronically Signed   By: Donavan Foil M.D.   On: 04/28/2019 02:43   DG Chest Port 1 View  Result Date: 04/28/2019 CLINICAL DATA:  Respiratory distress EXAM: PORTABLE CHEST 1 VIEW COMPARISON:  04/27/2019, 04/26/2019, 05/06/2019, CT chest 04/27/2019 FINDINGS: Esophageal tube tip is below the diaphragm but incompletely visualized. Post sternotomy changes. Right IJ Swan-Ganz catheter tip projects over the pulmonary outflow tract. Valve prosthesis. Right upper extremity central venous catheter tip over the cavoatrial region. Similar positioning of bilateral chest tubes. Slightly enlarged cardiomediastinal silhouette with vascular congestion. Little change in extensive bilateral interstitial and alveolar disease. Probable small pleural effusions. No pneumothorax. IMPRESSION: 1. Support lines and tubes as above. 2. Little interval change in cardiomegaly, vascular congestion and extensive bilateral airspace disease, probably reflecting edema, since yesterday's examination. Electronically Signed   By: Donavan Foil M.D.   On: 04/28/2019 01:36   DG Chest Port 1 View  Result Date: 04/27/2019 CLINICAL DATA:  Chronic systolic congestive heart failure. EXAM: PORTABLE CHEST 1 VIEW COMPARISON:  04/26/2019 FINDINGS: Enteric tube courses into the stomach and off the film as tip is not visualized. Right-sided  PICC line has tip just above the cavoatrial junction. Right IJ Swan-Ganz catheter, bilateral chest tubes and catheter from below along the inferior heart border unchanged. Lungs are adequately inflated demonstrate persistent hazy bilateral central airspace process with slight worsening over the left perihilar region as findings may be due to interstitial edema and less likely infection. No effusion. No pneumothorax. Cardiomediastinal silhouette and remainder of the exam is unchanged. IMPRESSION: 1. Persistent moderate bilateral hazy central airspace process with slight interval worsening over the left perihilar region. Findings are likely due to moderate interstitial edema and less likely infection. 2.  Tubes and lines as described. Electronically Signed   By: Marin Olp M.D.   On: 04/27/2019 08:26   DG Chest [facility]  Result Date: 04/26/2019 CLINICAL DATA:  Chest tube evaluation. EXAM: PORTABLE CHEST 1 VIEW COMPARISON:  04/27/2019 FINDINGS: Interval placement of enteric tube which courses into the region of the stomach and off the film as tip is not visualized. Bilateral chest tubes as well as right IJ Swan-Ganz catheter, mediastinal drain and catheter along the inferior heart border all unchanged. Right-sided PICC line with tip at the cavoatrial junction unchanged. Lungs are adequately inflated demonstrate stable hazy bilateral airspace opacification right worse than left which may be due to multifocal pneumonia versus asymmetric edema. No effusion. No evidence of pneumothorax. Cardiomediastinal silhouette and remainder the exam is unchanged. IMPRESSION: 1. Stable hazy bilateral airspace process right worse than left which may be due to asymmetric edema versus infection. 2. Tubes and lines as described. Bilateral chest tubes unchanged. No evidence of pneumothorax. Electronically Signed   By: Marin Olp M.D.   On: 04/26/2019 09:20   DG CHEST PORT 1 VIEW  Result Date: 05/06/2019 CLINICAL DATA:  Chest  tube present. Pleural effusion. EXAM: PORTABLE CHEST 1 VIEW COMPARISON:  One-view chest x-ray 04/24/2019 FINDINGS: Heart is mildly enlarged. Bilateral chest tubes remain in place. Diffuse interstitial pattern has increased. Bilateral effusions are noted. No significant pneumothorax is present. IMPRESSION: 1. Increasing interstitial pattern compatible with congestive heart failure. 2. Bilateral chest tubes without significant pneumothorax. 3. Bilateral pleural effusions. Electronically Signed   By: San Morelle M.D.   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 EXAM: PORTABLE CHEST 1 VIEW COMPARISON:  04/23/2019 FINDINGS: Stable surgical changes. The endotracheal tube and NG tubes have been removed. Bilateral chest tubes are stable. No pneumothorax. Impella device remains in good position without complicating features. Right IJ Swan-Ganz catheter in good position with its tip in the main pulmonary outflow tract. Right PICC line is stable. Mediastinal drain tubes are stable. Slight worsening lung aeration post extubation with slight increase in pulmonary edema and atelectasis. Persistent small right effusion. IMPRESSION: 1. Removal of endotracheal tube and NG tube. Other support apparatus is stable as detailed above. 2. Slight worsening lung aeration post extubation with slight increase in pulmonary edema and atelectasis. Electronically Signed   By: Marijo Sanes M.D.   On: 04/24/2019 08:57   DG Chest Port 1 View  Result Date: 04/23/2019 CLINICAL DATA:  Status post open heart surgery. EXAM: PORTABLE CHEST 1 VIEW COMPARISON:  April 22, 2019. FINDINGS: Stable cardiomediastinal silhouette. Endotracheal and nasogastric tubes are unchanged in position. Left ventricular assist device is unchanged in position. Right internal jugular Swan-Ganz catheter is noted with distal tip in expected position of main  pulmonary artery. Bilateral chest tubes are noted without pneumothorax. Stable bibasilar atelectasis is noted, right greater than left. Bony thorax is unremarkable. IMPRESSION: Stable support apparatus. Stable bibasilar atelectasis, right greater than left. No pneumothorax is noted. Electronically Signed   By: Marijo Conception M.D.   On: 04/23/2019 09:07   DG Chest Portable 1 View  Result Date: 05/12/2019 CLINICAL DATA:  64 year old male status post open heart surgery. Line placement. EXAM: PORTABLE CHEST 1 VIEW COMPARISON:  Earlier radiograph dated 05/11/2019. FINDINGS: There has been interval advancement of the Swan-Ganz catheter with tip projecting over the left ventricle likely in the left lower lobe pulmonary artery. Endotracheal tube remains above the carina. Bilateral chest tubes and mediastinal drain noted. Right subclavian Impella device as seen on the earlier radiograph. Right-sided PICC is  also in similar position. There has been interval placement of an additional support apparatus with tip projecting over the mediastinum. No significant interval change in the appearance of the lungs or cardiomediastinal silhouette. Median sternotomy wires, CABG vascular clips, and cardiac valve repair. IMPRESSION: 1. Interval advancement of the Swan-Ganz catheter with tip projecting over the left ventricle likely in the left lower lobe pulmonary artery. Clinical correlation is recommended. 2. Additional support apparatus as above. No other interval change in the appearance of the lungs or cardiomediastinal silhouette since the earlier radiograph. Electronically Signed   By: Anner Crete M.D.   On: 05/01/2019 16:57   DG Chest Portable 1 View  Result Date: 05/04/2019 CLINICAL DATA:  Swan-Ganz adjustment status post cardiac surgery. EXAM: PORTABLE CHEST 1 VIEW COMPARISON:  Radiograph of same day. FINDINGS: Stable cardiomediastinal silhouette. Endotracheal tube is in grossly good position. Stable position of  Impella device. Bilateral chest tubes are noted without pneumothorax. Left lung is clear. Mild right basilar subsegmental atelectasis is noted. Right internal jugular Swan-Ganz catheter is noted with tip in expected position of proximal left pulmonary artery; it has been withdrawn partially since prior exam. Bony thorax is unremarkable. IMPRESSION: Stable position of Impella device. Bilateral chest tubes are noted without pneumothorax. Right internal jugular Swan-Ganz catheter has been withdrawn partially since prior exam with tip in expected position of proximal left pulmonary artery. Stable right basilar subsegmental atelectasis. Electronically Signed   By: Marijo Conception M.D.   On: 04/30/2019 16:49   DG Chest Port 1 View  Result Date: 04/21/2019 CLINICAL DATA:  Chest tubes present.  Patient extubated. EXAM: PORTABLE CHEST 1 VIEW COMPARISON:  April 19, 2019 FINDINGS: Endotracheal tube and nasogastric tube have been removed. There is a chest tube on each side. Swan-Ganz catheter tip is in the right main pulmonary artery. Balloon pump catheter tip is in the right ventricle. There is a small right apical pneumothorax. There is a minimal pneumothorax on the left. There is atelectatic change in the right base. Lungs elsewhere clear. Heart size and pulmonary vascularity are Brendan. No adenopathy. There is aortic atherosclerosis. No bone lesions. IMPRESSION: Chest tube on each side with rather minimal pneumothorax on each side. Atelectatic change right base. Lungs elsewhere clear. Other tubes and catheters as described. Stable cardiac silhouette. Aortic Atherosclerosis (ICD10-I70.0). Electronically Signed   By: Lowella Grip III M.D.   On: 04/21/2019 07:53   DG Abd Portable 1V  Result Date: 05/07/2019 CLINICAL DATA:  Feeding tube. EXAM: PORTABLE ABDOMEN - 1 VIEW COMPARISON:  None. FINDINGS: The bowel gas pattern is Brendan. Distal tip of feeding tube is seen in expected position of distal stomach or proximal  small bowel. No radio-opaque calculi or other significant radiographic abnormality are seen. IMPRESSION: Distal tip of feeding tube seen in expected position of distal stomach or proximal small bowel. Electronically Signed   By: Marijo Conception M.D.   On: 05/07/2019 10:56   ECHO INTRAOPERATIVE TEE  Result Date: 05/16/2019  *INTRAOPERATIVE TRANSESOPHAGEAL REPORT *  Patient Name:   Brendan [name] Date of Exam: 04/17/2019 Medical Rec #:  [id number]        Height:       71.0 in Accession #:    [id number]       Weight:       161.1 lb Date of Birth:  Jun 04, 1955        BSA:          1.92 m Patient Age:  63 years         BP:           112/50 mmHg Patient Gender: M                HR:           90 bpm. Exam Location:  Inpatient Transesophogeal exam was perform intraoperatively during procedure to remove impella and assess cardiac function following impella removal. Patient was closely monitored under general anesthesia during the entirety of examination. Indications:     Removal of impella Performing Phys: [place] TRIGT Diagnosing Phys: Roberts Gaudy MD PRE-OP FINDINGS  Left Ventricle: Following impella removal, the LV cavity was makedly enlarged and measured 6.1 cm at end-diastole at the mid-papillary level. The ejection fraction was estimated at 35% by visual inspection with akinesis of the inferior wall and Brendan appearing contractility of the anterior wall and anterior septum. Right Ventricle: The RV cavity was Brendan in size. RV systolic function appeared Brendan.  Pericardium: There is no evidence of pericardial effusion. Mitral Valve: There was an annuloplasty ring in the mitral position. The anterior leaflet was freely mobile and posterior leaflet was restricted in motion. There was no mitral regurgitation seen on color Doppler. The mean trans-mitral gradient was 3 mmhg. Tricuspid Valve: There was trace tricuspid regurgitation. Aortic Valve: There was an impella cannula through the aortic valve at the  beginning of the procedure. Following removal of the impella, the aortic valve leaflets opened normally and there was no aortic insufficiency. +--------------+-------++ LEFT VENTRICLE        +--------------+-------++ PLAX 2D               +--------------+-------++ LVIDd:        6.08 cm +--------------+-------++ LVIDs:        4.99 cm +--------------+-------++ LV SV:        68 ml   +--------------+-------++ LV SV Index:  35.47   +--------------+-------++                       +--------------+-------++ +-------------+---------++ MITRAL VALVE           +-------------+---------++ MV Peak grad:10.1 mmHg +-------------+---------++ MV Mean grad:3.0 mmHg  +-------------+---------++ MV Vmax:     1.59 m/s  +-------------+---------++ MV Vmean:    75.3 cm/s +-------------+---------++ MV VTI:      0.28 m    +-------------+---------++  Roberts Gaudy MD Electronically signed by Roberts Gaudy MD Signature Date/Time: 05/07/2019/5:52:04 PM    Final    ECHO INTRAOPERATIVE TEE  Result Date: 04/23/2019  *INTRAOPERATIVE TRANSESOPHAGEAL REPORT *  Patient Name:   Brendan [name] Date of Exam: 05/11/2019 Medical Rec #:  [id number]        Height:       71.0 in Accession #:    [id number]       Weight:       140.0 lb Date of Birth:  1955/01/31        BSA:          1.81 m Patient Age:    23 years         BP:           97/70 mmHg Patient Gender: M                HR:           74 bpm. Exam Location:  Anesthesiology Transesophogeal exam was perform intraoperatively during surgical procedure. Patient was closely monitored under  general anesthesia during the entirety of examination. Indications:     Coronary artery disease Performing Phys: [place] TRIGT Diagnosing Phys: Duane Boston MD Complications: No known complications during this procedure. POST-OP IMPRESSIONS - Left Ventricle: Post bypass, the impella distal tip was no longer in the apex. Surgeon attempted to reposition the tip, but the  device would not remain in the apex despite multiple attempts. - Mitral Valve: Mitral Ring inseterted with excellent results. No resulting stenosis or regurgitation. PRE-OP FINDINGS  Left Ventricle: The left ventricle has moderately reduced systolic function, with an ejection fraction of 35-40%. The cavity size was moderately dilated. There is no increase in left ventricular wall thickness. Left ventrical global hypokinesis without regional wall motion abnormalities. Right Ventricle: The right ventricle has Brendan systolic function. The cavity was Brendan. There is no increase in right ventricular wall thickness. Left Atrium: Left atrial size was Brendan in size. Right Atrium: Right atrial size was Brendan in size. Right atrial pressure is estimated at 10 mmHg. Interatrial Septum: No atrial level shunt detected by color flow Doppler. Pericardium: There is no evidence of pericardial effusion. Mitral Valve: The mitral valve is dilated. Mild thickening of the mitral valve leaflet. Mild calcification of the mitral valve leaflet. Mitral valve regurgitation is moderate by color flow Doppler. The MR jet is centrally-directed. Tricuspid Valve: The tricuspid valve was Brendan in structure. Tricuspid valve regurgitation is mild by color flow Doppler. The jet is directed centrally. The tricuspid valve is mildly thickened. The tricuspid valve is mildly calcified. Aortic Valve: The aortic valve is tricuspid There is mild thickening of the aortic valve and There is mild calcification of the aortic valve Aortic valve regurgitation is mild by color flow Doppler. There is no evidence of aortic valve stenosis. Impella visualized crossing the Aortic valve in good position. Pulmonic Valve: The pulmonic valve was Brendan in structure. Pulmonic valve regurgitation is trivial by color flow Doppler. Aorta: The aortic root is Brendan in size and structure. The ascending aorta was not well visualized. There is evidence of layered plaque in the  descending aorta; Grade II, measuring 2-86mm in size. +--------------+-------++ LEFT VENTRICLE        +--------------+-------++ PLAX 2D               +--------------+-------++ +------------+-------------++ LVIDd:        5.89 cm 3D Volume EF              +--------------+-------++ +------------+-------------++ LVIDs:        5.04 cm LV 3D EF:   49.80 %       +--------------+-------++ +------------+-------------++ LV PW:        0.78 cm LV 3D EDV:  128800.00 mm +--------------+-------++ +------------+-------------++ LV IVS:       0.78 cm LV 3D ESV:  64700.00 mm  +--------------+-------++ +------------+-------------++ LV SV:        52 ml   LV 3D SV:   64100.00 mm  +--------------+-------++ +------------+-------------++                       +--------------+-------++ +-------------+---------++ MITRAL VALVE           +-------------+---------++ MV Mean grad:1.0 mmHg  +-------------+---------++ MV Vmax:     1.03 m/s  +-------------+---------++ MV Vmean:    55.1 cm/s +-------------+---------++ +-------------+-----------++ MR Mean grad:52.0 mmHg   +-------------+-----------++ MR Vmax:     446.00 cm/s +-------------+-----------++ MR Vmean:    329.0 cm/s  +-------------+-----------++  Duane Boston MD Electronically  signed by Duane Boston MD Signature Date/Time: 04/23/2019/9:15:18 AM    Final    ECHOCARDIOGRAM LIMITED  Result Date: 05/08/2019    ECHOCARDIOGRAM LIMITED REPORT   Patient Name:   Brendan [name] Date of Exam: 05/08/2019 Medical Rec #:  [id number]        Height:       71.0 in Accession #:    [id number]       Weight:       139.6 lb Date of Birth:  09-09-1955        BSA:          1.810 m Patient Age:    89 years         BP:           90/62 mmHg Patient Gender: M                HR:           53 bpm. Exam Location:  Inpatient Procedure: Limited Echo, Color Doppler and Cardiac Doppler Indications:    Hypotension  History:        Patient has  prior history of Echocardiogram examinations, most                 recent 04/26/2019. CHF, NSTEMI, Prior CABG; Risk                 Factors:Diabetes. CABG and MV repair on 04/24/2019, Impella in place                 from 4/2-4/9.  Sonographer:    Raquel Sarna Senior RDCS Referring Phys: [place]  1. Left ventricular ejection fraction, by estimation, is 30 to 35%. The left ventricle has moderately decreased function. The left ventricle demonstrates regional wall motion abnormalities, akinesis of the basal to mid inferoseptal, inferior, and inferolateral walls. No LV thrombus noted. Left ventricular diastolic function could not be evaluated.  2. Right ventricular systolic function is mildly reduced. The right ventricular size is Brendan. Tricuspid regurgitation signal is inadequate for assessing PA pressure.  3. Status post mitral valve repair. No significant regurgitation or stenosis.  4. The aortic valve is tricuspid. Mild aortic valve sclerosis is present, with no evidence of aortic valve stenosis.  5. The inferior vena cava is Brendan in size with <50% respiratory variability, suggesting right atrial pressure of 8 mmHg. FINDINGS  Left Ventricle: Left ventricular ejection fraction, by estimation, is 30 to 35%. The left ventricle has moderately decreased function. The left ventricle demonstrates regional wall motion abnormalities. The left ventricular internal cavity size was small. There is no left ventricular hypertrophy. Right Ventricle: The right ventricular size is Brendan. No increase in right ventricular wall thickness. Right ventricular systolic function is mildly reduced. Tricuspid regurgitation signal is inadequate for assessing PA pressure. Mitral Valve: Status post mitral valve repair. No significant regurgitation or stenosis. MV peak gradient, 8.4 mmHg. The mean mitral valve gradient is 3.0 mmHg. Aortic Valve: The aortic valve is tricuspid. Mild aortic valve sclerosis is present, with no  evidence of aortic valve stenosis. Venous: The inferior vena cava is Brendan in size with less than 50% respiratory variability, suggesting right atrial pressure of 8 mmHg.   LV Volumes (MOD) LV vol d, MOD A2C: 142.0 ml LV vol d, MOD A4C: 145.0 ml LV vol s, MOD A2C: 95.1 ml LV vol s, MOD A4C: 95.6 ml LV SV MOD A2C:     46.9 ml LV SV MOD A4C:  145.0 ml LV SV MOD BP:      51.3 ml RIGHT VENTRICLE RV S prime:     5.10 cm/s TAPSE (M-mode): 0.9 cm AORTIC VALVE LVOT Vmax:   88.45 cm/s LVOT Vmean:  58.900 cm/s LVOT VTI:    0.160 m MITRAL VALVE MV Peak grad: 8.4 mmHg  SHUNTS MV Mean grad: 3.0 mmHg  Systemic VTI: 0.16 m MV Vmax:      1.45 m/s MV Vmean:     69.9 cm/s Loralie Champagne MD Electronically signed by Loralie Champagne MD Signature Date/Time: 05/08/2019/3:09:26 PM    Final    ECHOCARDIOGRAM LIMITED  Result Date: 04/26/2019    ECHOCARDIOGRAM LIMITED REPORT   Patient Name:   Brendan [name] Date of Exam: 04/26/2019 Medical Rec #:  [id number]        Height:       71.0 in Accession #:    [id number]       Weight:       172.2 lb Date of Birth:  1955-03-14        BSA:          1.979 m Patient Age:    39 years         BP:           115/59 mmHg Patient Gender: M                HR:           111 bpm. Exam Location:  Inpatient Procedure: Limited Echo Indications:    CHF  History:        Patient has prior history of Echocardiogram examinations, most                 recent 04/27/2019. CHF, CAD, Prior CABG and Impella, MV repair;                 Risk Factors:Diabetes.  Sonographer:    Dustin Flock Referring Phys: [id number] SWFUXNA Z ATKINS  Sonographer Comments: S/p CABG. IMPRESSIONS  1. Very poor image quality limits accurate assessment of wall motion. There appears to be akinesis of the inferior and inferolateral walls. Possible false tendon in the LV apex but cannot rule out LV thrombus. Left ventricular ejection fraction, by estimation, is 25 to 30%. The left ventricle has severely decreased function. The left ventricle has no  regional wall motion abnormalities.  2. Right ventricular systolic function is Brendan. The right ventricular size is Brendan.  3. The mitral valve is Brendan in structure. No evidence of mitral valve regurgitation. No evidence of mitral stenosis.  4. The aortic valve was not well visualized. Aortic valve regurgitation is not visualized. Mild aortic valve sclerosis is present, with no evidence of aortic valve stenosis.  5. The inferior vena cava is Brendan in size with greater than 50% respiratory variability, suggesting right atrial pressure of 3 mmHg.  6. Recommend repeat limited echo with definity contrast FINDINGS  Left Ventricle: Very poor image quality limits accurate assessment of wall motion. There appears to be akinesis of the inferior and inferolateral walls. Possible false tendon in the LV apex but cannot rule out LV thrombus. Left ventricular ejection fraction, by estimation, is 25 to 30%. The left ventricle has severely decreased function. The left ventricle has no regional wall motion abnormalities. The left ventricular internal cavity size was Brendan in size. There is mild  concentric left ventricular hypertrophy. Right Ventricle: The right ventricular size is Brendan. No increase in right ventricular wall thickness.  Right ventricular systolic function is Brendan. Left Atrium: Left atrial size was Brendan in size. Right Atrium: Right atrial size was Brendan in size. Pericardium: There is no evidence of pericardial effusion. Mitral Valve: The mitral valve is Brendan in structure. Brendan mobility of the mitral valve leaflets. No evidence of mitral valve stenosis. Tricuspid Valve: The tricuspid valve is Brendan in structure. Tricuspid valve regurgitation is not demonstrated. No evidence of tricuspid stenosis. Aortic Valve: The aortic valve was not well visualized. Aortic valve regurgitation is not visualized. Mild aortic valve sclerosis is present, with no evidence of aortic valve stenosis. Pulmonic Valve: The  pulmonic valve was Brendan in structure. Pulmonic valve regurgitation is not visualized. No evidence of pulmonic stenosis. Aorta: The aortic root is Brendan in size and structure. Venous: The inferior vena cava is Brendan in size with greater than 50% respiratory variability, suggesting right atrial pressure of 3 mmHg. IAS/Shunts: No atrial level shunt detected by color flow Doppler.  LEFT VENTRICLE PLAX 2D LVIDd:         5.17 cm LVIDs:         4.64 cm LV PW:         1.18 cm LV IVS:        1.24 cm  Fransico Him MD Electronically signed by Fransico Him MD Signature Date/Time: 04/26/2019/2:33:13 PM    Final    US Abdomen Limited RUQ  Result Date: 05/03/2019 CLINICAL DATA:  Cholestasis.  Cholecystectomy. EXAM: ULTRASOUND ABDOMEN LIMITED RIGHT UPPER QUADRANT COMPARISON:  Renal ultrasound 04/27/2019 FINDINGS: Gallbladder: Surgically absent Common bile duct: Diameter: Brendan, 7 mm. Liver: Mildly obscured by abdominal bandages and patient immobility. Grossly within Brendan limits. Portal vein is patent on color Doppler imaging with Brendan direction of blood flow towards the liver. Other: Trace right pleural fluid. IMPRESSION: Cholecystectomy without biliary duct dilatation. Trace right pleural fluid. Electronically Signed   By: Abigail Miyamoto M.D.   On: 04/22/2019 09:37    Labs:  CBC: Recent Labs    05/17/19 0653 05/17/19 0653 05/17/19 0721 05/18/19 0336 05/19/19 0340 05/19/19 1130  WBC 17.6*  --  18.4* 11.5* 14.1*  --   HGB 11.0*   < > 10.5* 9.6* 9.7* 10.5*  HCT 35.2*   < > 34.3* 31.5* 31.7* 31.0*  PLT 212  --  219 151 138*  --    < > = values in this interval not displayed.    COAGS: Recent Labs    03/28/2019 2149 04/19/19 0313 04/20/2019 1642 04/23/19 1033 04/29/19 0444 04/30/19 0436 05/01/19 0449 05/13/2019 0334  INR 1.3*  --  1.6*  --   --   --  1.6*  --   APTT 65*   < > 41*   < > 42* 39* 42* 59*   < > = values in this interval not displayed.    BMP: Recent Labs    05/17/19 0721  05/17/19 0721 05/17/19 1320 05/18/19 0336 05/19/19 0340 05/19/19 1130  NA 134*   < > 136 135 132* 134*  K 5.9*   < > 4.2 4.6 6.4* 3.9  CL 95*   < > 98 95* 90* 95*  CO2 23  --  27 28 26   --   GLUCOSE 159*   < > 199* 161* 250* 150*  BUN 127*   < > 41* 75* 117* 35*  CALCIUM 8.7*  --  8.1* 8.1* 8.2*  --   CREATININE 3.49*   < > 1.72* 2.64* 3.77* 1.40*  GFRNONAA 18*  --  42* 25* 16*  --   GFRAA 21*  --  49* 29* 19*  --    < > = values in this interval not displayed.    LIVER FUNCTION TESTS: Recent Labs    05/12/19 0232 05/12/19 1600 05/13/19 0555 05/13/19 1600 05/14/19 0505 05/14/19 1625 05/15/19 0302 05/15/19 0302 05/16/19 0356 05/17/19 0329 05/18/19 0336 05/19/19 0340  BILITOT 1.1  --  0.8  --  1.0  --  0.8  --   --   --   --   --   AST 43*  --  53*  --  34  --  44*  --   --   --   --   --   ALT 66*  --  64*  --  54*  --  56*  --   --   --   --   --   ALKPHOS 504*  --  525*  --  482*  --  469*  --   --   --   --   --   PROT 6.4*  --  6.6  --  7.2  --  7.0  --   --   --   --   --   ALBUMIN 1.7*   < > 1.7*  1.7*   < > 2.3*  2.4*   < > 2.2*  2.2*   < > 2.0* 1.9* 2.1* 2.1*   < > = values in this interval not displayed.    TUMOR MARKERS: No results for input(s): AFPTM, CEA, CA199, CHROMGRNA in the last 8760 hours.  Assessment and Plan: Renal failure Patient with new HFrEF s/p CABG with respiratory failure, renal failure requiring intermittent HD via L subclavian line.  His L subclavian line has been in place for several weeks.  In need of exchange.  Due to poor renal recovery and expected long-term dialysis need IR consulted for tunneled HD catheter placement.  TFs held this AM.  Note several healing incisions on the right chest.  Currently on trach collar. Requiring intermittent pressors.  To contact family for consent.   Thank you for this interesting consult.  I greatly enjoyed meeting VIREN LEBEAU and look forward to participating in their care.  A copy of  this report was sent to the requesting provider on this date.  Electronically Signed: Docia Barrier, PA 05/19/2019, 12:49 PM   I spent a total of 20 Minutes    in face to face in clinical consultation, greater than 50% of which was counseling/coordinating care for renal failure.

## 2019-05-19 NOTE — Progress Notes (Signed)
Patient ID: Brendan [name], male   DOB: [birth year], 64 y.o.   MRN: [id number] S: Seen on HD and has had a drop in BP.  Given IV albumin O:BP 97/64   Pulse 79   Temp 97.8 F (36.6 C) (Oral)   Resp 16   Ht _0  (1.803 m) Comment: measured x 3  Wt 58.9 kg   SpO2 (!) 89%   BMI 18.11 kg/m   Intake/Output Summary (Last 24 hours) at 05/19/2019 0934 Last data filed at 05/19/2019 0800 Gross per 24 hour  Intake 1387.65 ml  Output --  Net 1387.65 ml   Intake/Output: I/O last 3 completed shifts: In: 1992.9 [I.V.:132.9; NG/GT:1860] Out: -   Intake/Output this shift:  Total I/O In: 163.9 [I.V.:8.9; NG/GT:155] Out: -  Weight change: 0.8 kg Gen: frail, cachectic WM on trach collar CVS: no rub Resp: occ rhonchi Abd: benign Ext: no edema  Recent Labs  Lab 05/13/19 0555 05/13/19 1029 05/14/19 0505 05/14/19 0505 05/14/19 1625 05/14/19 1625 05/15/19 0302 05/15/19 0302 05/15/19 0443 05/16/19 0356 05/17/19 0329 05/17/19 0721 05/17/19 1320 05/18/19 0336 05/19/19 0340  NA 133*   < > 133*   < > 134*   < > 135   < > 133* 134* 134* 134* 136 135 132*  K 4.1   < > 5.2*   < > 4.7   < > 5.0   < > 5.1 4.4 6.2* 5.9* 4.2 4.6 6.4*  CL 100   < > 100   < > 102   < > 101  --   --  101 98 95* 98 95* 90*  CO2 27   < > 21*   < > 26   < > 27  --   --  _1 GLUCOSE 239*   < > 223*   < > 182*   < > 226*  --   --  153* 160* 159* 199* 161* 250*  BUN 32*   < > 33*   < > 35*   < > 34*  --   --  69* 119* 127* 41* 75* 117*  CREATININE 0.88   < > 0.92   < > 0.93   < > 0.91  --   --  1.88* 3.36* 3.49* 1.72* 2.64* 3.77*  ALBUMIN 1.7*  1.7*   < > 2.3*  2.4*  --  2.1*  --  2.2*  2.2*  --   --  2.0* 1.9*  --   --  2.1* 2.1*  CALCIUM 7.9*   < > 8.5*   < > 8.2*   < > 8.4*  --   --  8.4* 8.5* 8.7* 8.1* 8.1* 8.2*  PHOS 2.8   < > 3.6  --  3.2  --  3.4  --   --  4.8* 8.2*  --   --  8.7* >30.0*  AST 53*  --  34  --   --   --  44*  --   --   --   --   --   --   --   --   ALT 64*  --  54*  --   --   --   56*  --   --   --   --   --   --   --   --    < > = values in this interval not displayed.   Liver Function Tests: Recent Labs  Lab 05/13/19 0555 05/13/19 1600 05/14/19 0505 05/14/19 1625 05/15/19 0302 05/16/19 0356 05/17/19 0329 05/18/19 0336 05/19/19 0340  AST 53*  --  34  --  44*  --   --   --   --   ALT 64*  --  54*  --  56*  --   --   --   --   ALKPHOS 525*  --  482*  --  469*  --   --   --   --   BILITOT 0.8  --  1.0  --  0.8  --   --   --   --   PROT 6.6  --  7.2  --  7.0  --   --   --   --   ALBUMIN 1.7*  1.7*   < > 2.3*  2.4*   < > 2.2*  2.2*   < > 1.9* 2.1* 2.1*   < > = values in this interval not displayed.   No results for input(s): LIPASE, AMYLASE in the last 168 hours. No results for input(s): AMMONIA in the last 168 hours. CBC: Recent Labs  Lab 05/16/19 0356 05/16/19 0356 05/17/19 0653 05/17/19 0653 05/17/19 0721 05/18/19 0336 05/19/19 0340  WBC 15.4*   < > 17.6*   < > 18.4* 11.5* 14.1*  HGB 10.7*   < > 11.0*   < > 10.5* 9.6* 9.7*  HCT 35.1*   < > 35.2*   < > 34.3* 31.5* 31.7*  MCV 101.2*  --  98.9  --  98.0 98.7 100.3*  PLT 272   < > 212   < > 219 151 138*   < > = values in this interval not displayed.   Cardiac Enzymes: No results for input(s): CKTOTAL, CKMB, CKMBINDEX, TROPONINI in the last 168 hours. CBG: Recent Labs  Lab 05/18/19 1555 05/18/19 1929 05/18/19 2329 05/19/19 0335 05/19/19 0738  GLUCAP 187* 88 162* 225* 212*    Iron Studies: No results for input(s): IRON, TIBC, TRANSFERRIN, FERRITIN in the last 72 hours. Studies/Results: DG Chest Port 1 View  Result Date: 05/18/2019 CLINICAL DATA:  Mitral valve replacement. EXAM: PORTABLE CHEST 1 VIEW COMPARISON:  05/16/2019 FINDINGS: Tracheostomy tube in adequate position. Enteric tube courses into the stomach and off the film as tip is not visualized. Left subclavian central venous catheter unchanged with tip at the level of the cavoatrial junction. Right-sided PICC line present with tip  at the level of the cavoatrial junction. Lungs are adequately inflated demonstrating persistent moderate hazy airspace opacification most prominent over the central/perihilar regions. Findings may be due to moderate interstitial edema or infection. No definite effusion or pneumothorax. Cardiomediastinal silhouette and remainder of the exam is unchanged. IMPRESSION: 1. Stable bilateral hazy airspace process most prominent over the central lungs/perihilar regions as findings may be due to interstitial edema or infection. 2.  Tubes and lines as described. Electronically Signed   By: Marin Olp M.D.   On: 05/18/2019 09:02   . aspirin EC  325 mg Oral Daily   Or  . aspirin  324 mg Per Tube Daily  . B-complex with vitamin C  1 tablet Per Tube Daily  . chlorhexidine gluconate (MEDLINE KIT)  15 mL Mouth Rinse BID  . Chlorhexidine Gluconate Cloth  6 each Topical Q0600  . docusate  200 mg Per Tube Daily  . feeding supplement (PRO-STAT SUGAR FREE 64)  30 mL Oral TID  . fluconazole  200 mg Per Tube Daily  .  heparin sodium (porcine)      . insulin aspart  0-24 Units Subcutaneous Q4H  . insulin aspart  2 Units Subcutaneous Q4H  . insulin glargine  16 Units Subcutaneous BID  . mouth rinse  15 mL Mouth Rinse 10 times per day  . metoCLOPramide (REGLAN) injection  5 mg Intravenous Q8H  . midodrine  15 mg Per Tube Q8H  . neomycin-polymyxin-hydrocortisone  3 drop Left EAR Q12H  . oxyCODONE  10 mg Per Tube Q6H  . pantoprazole sodium  40 mg Per Tube BID  . pneumococcal 23 valent vaccine  0.5 mL Intramuscular Tomorrow-1000  . sevelamer carbonate  800 mg Oral TID WC  . sodium chloride flush  10-40 mL Intracatheter Q12H    BMET    Component Value Date/Time   NA 132 (L) 05/19/2019 0340   K 6.4 (HH) 05/19/2019 0340   CL 90 (L) 05/19/2019 0340   CO2 26 05/19/2019 0340   GLUCOSE 250 (H) 05/19/2019 0340   BUN 117 (H) 05/19/2019 0340   CREATININE 3.77 (H) 05/19/2019 0340   CALCIUM 8.2 (L) 05/19/2019 0340    GFRNONAA 16 (L) 05/19/2019 0340   GFRAA 19 (L) 05/19/2019 0340   CBC    Component Value Date/Time   WBC 14.1 (H) 05/19/2019 0340   RBC 3.16 (L) 05/19/2019 0340   HGB 9.7 (L) 05/19/2019 0340   HCT 31.7 (L) 05/19/2019 0340   PLT 138 (L) 05/19/2019 0340   MCV 100.3 (H) 05/19/2019 0340   MCH 30.7 05/19/2019 0340   MCHC 30.6 05/19/2019 0340   RDW 17.9 (H) 05/19/2019 0340   LYMPHSABS 1.1 05/07/2019 0402   MONOABS 1.1 (H) 04/30/2019 0402   EOSABS 0.2 05/07/2019 0402   BASOSABS 0.0 05/01/2019 0402   Brief HPI: admitted to outside hospital on 3/26 with SOB, new low EF and NSTEMI txd to [facility] 3/29: 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  Admission Scr was 0.99.  Assessment/Plan:  1. Oliguric/anuric AKI- CRRT started on 4/10 and taken off 4/29.  Transitioned to IHD on 05/17/19 and today is on his second session but with low bp and requiring increase in levophed. 2. Vascular access- left subclavian temp HD catheter needs to be removed as it has been in for over 2 weeks.  Will consult IR for tunneled HD catheter and pull temp HD catheter. 3. CAD- 3 vessel with EF 25% s/p CABG and MVR 7/0/48 complicated by cardiogenic shock 4. Acute systolic CHF and cardiogenic shock- still on pressors and inotrope dependent.  Volume improved with CRRT/IHD 5. Acute hypoxic respiratory failure- s/p trach 6. DM- per primary 7. MR s/p MVR 8. Polymorphic VT- off amio 9. Severe debility- will need [place], MD [facility] [phone]

## 2019-05-19 NOTE — Progress Notes (Signed)
Hypoglycemic Event  CBG: 58  Treatment: 25 mL D50  Symptoms: none  Follow-up CBG: Time: 1537 CBG Result: 72  Possible Reasons for Event: tube feeds on hold, insulin increased  Comments/MD notified: N/A    Brendan [name]

## 2019-05-19 NOTE — Progress Notes (Addendum)
CT surgery  HD today- nsr norepi down to 2 Up to chair  O2 sat 95% on trach collar Remove EPWs tomorrow

## 2019-05-19 NOTE — Progress Notes (Addendum)
Patient ID: Brendan [name], male   DOB: 10/15/1955, 64 y.o.   MRN: [id number]     Advanced Heart Failure Rounding Note  PCP-Cardiologist: No primary care provider on file.   Subjective:    Events - Presented to [facility] with acute HF  EF 20-25% - Transferred [facility] - Cath 3/30 with severe 3 vessel disease. EF 25% - Developed PMVT on milrinone -> milrinone stopped -> developed worsening shock -> IABP placed on 4/1 - Clinical deterioration 4/1-> intubated - 4/2 underwent Impella 5.5 placement earlier and bilateral chest tubes with 3L out from each side.  - Extubated 4/4 - 04/23/2019 CABG and MVR - Extubated 4/7 - Back to OR for Impella 5.5 extraction 4/9, extubated.  TEE with EF 35%, RV ok, trivial MR s/p MV repair.  - CVVH started 4/10 with oliguria, rising creatinine and CVP.  - Deteriorating respiratory status, intubated again early am 4/12.  Enterococcus faecalis in sputum.  - Extubated 4/14.  Developed respiratory distress and started on Bipap.  Had VT arrest with CPR/epinephrine, intubated again 4/15 early am.  - Tracheostomy 4/16 - Atrial fibrillation early am 4/17 -Echo 4/22: EF 30-35% RV mildly reduced. MVR looks good. - S/P Bronchoscopy 4/27   -CVVHD stopped 4/29. Had first HD on 5/1.  Remains anuric. K 6.4. Wt up 3 lb. Currently getting HD.   Remains on NE, 9 mcg. Cuff MAP 65.  Co-ox stable at 65%.   No complaints today. Appears to be tolerating iHD ok.    Objective:   Weight Range: 56.9 kg Body mass index is 17.5 kg/m.   Vital Signs:   Temp:  [96.9 F (36.1 C)-98.5 F (36.9 C)] 97.9 F (36.6 C) (05/03 0400) Pulse Rate:  [60-83] 60 (05/03 0715) Resp:  [8-24] 14 (05/03 0715) BP: (83-113)/(42-64) 101/49 (05/03 0715) SpO2:  [79 %-100 %] 81 % (05/03 0715) FiO2 (%):  [35 %] 35 % (05/03 0412) Weight:  [56.9 kg] 56.9 kg (05/03 0500) Last BM Date: 05/18/19  Weight change: Filed Weights   05/17/19 1100 05/18/19 0500 05/19/19 0500  Weight: 55.5 kg 55.6 kg 56.9 kg     Intake/Output:   Intake/Output Summary (Last 24 hours) at 05/19/2019 0732 Last data filed at 05/19/2019 0700 Gross per 24 hour  Intake 1281.53 ml  Output --  Net 1281.53 ml      Physical Exam    PHYSICAL EXAM: General:  Frail and cachetic appearing WM. No respiratory difficulty HEENT: + TC, + NGT  Neck: supple. no JVD. Carotids 2+ bilat; no bruits. No lymphadenopathy or thyromegaly appreciated. Cor: PMI nondisplaced. Regular rate & rhythm. No rubs, gallops or murmurs. Lungs: course BS bilaterally, no wheezing  Abdomen: soft, nontender, nondistended. No hepatosplenomegaly. No bruits or masses. Good bowel sounds. Extremities: no cyanosis, clubbing, rash, thin extremities no edema Neuro: alert & oriented x 3, cranial nerves grossly intact. moves all 4 extremities w/o difficulty. Affect pleasant.   Telemetry    NSR 70s. No recurrent VT  Personally reviewed   Labs    CBC Recent Labs    05/18/19 0336 05/19/19 0340  WBC 11.5* 14.1*  HGB 9.6* 9.7*  HCT 31.5* 31.7*  MCV 98.7 100.3*  PLT 151 330*   Basic Metabolic Panel Recent Labs    05/18/19 0336 05/19/19 0340  NA 135 132*  K 4.6 6.4*  CL 95* 90*  CO2 28 26  GLUCOSE 161* 250*  BUN 75* 117*  CREATININE 2.64* 3.77*  CALCIUM 8.1* 8.2*  MG 2.4  2.7*  PHOS 8.7* >30.0*   Liver Function Tests Recent Labs    05/18/19 0336 05/19/19 0340  ALBUMIN 2.1* 2.1*   No results for input(s): LIPASE, AMYLASE in the last 72 hours. Cardiac Enzymes No results for input(s): CKTOTAL, CKMB, CKMBINDEX, TROPONINI in the last 72 hours.  BNP: BNP (last 3 results) Recent Labs    03/26/2019 0113  BNP 655.7*    ProBNP (last 3 results) No results for input(s): PROBNP in the last 8760 hours.   D-Dimer No results for input(s): DDIMER in the last 72 hours. Hemoglobin A1C No results for input(s): HGBA1C in the last 72 hours. Fasting Lipid Panel No results for input(s): CHOL, HDL, LDLCALC, TRIG, CHOLHDL, LDLDIRECT in the last  72 hours. Thyroid Function Tests No results for input(s): TSH, T4TOTAL, T3FREE, THYROIDAB in the last 72 hours.  Invalid input(s): FREET3  Other results:   Imaging    No results found.   Medications:     Scheduled Medications: . aspirin EC  325 mg Oral Daily   Or  . aspirin  324 mg Per Tube Daily  . B-complex with vitamin C  1 tablet Per Tube Daily  . chlorhexidine gluconate (MEDLINE KIT)  15 mL Mouth Rinse BID  . Chlorhexidine Gluconate Cloth  6 each Topical Q0600  . docusate  200 mg Per Tube Daily  . feeding supplement (PRO-STAT SUGAR FREE 64)  30 mL Oral TID  . fluconazole  200 mg Per Tube Daily  . heparin sodium (porcine)      . insulin aspart  0-24 Units Subcutaneous Q4H  . insulin aspart  2 Units Subcutaneous Q4H  . insulin glargine  14 Units Subcutaneous BID  . mouth rinse  15 mL Mouth Rinse 10 times per day  . metoCLOPramide (REGLAN) injection  5 mg Intravenous Q8H  . midodrine  15 mg Per Tube Q8H  . neomycin-polymyxin-hydrocortisone  3 drop Left EAR Q12H  . oxyCODONE  10 mg Per Tube Q6H  . pantoprazole sodium  40 mg Per Tube BID  . pneumococcal 23 valent vaccine  0.5 mL Intramuscular Tomorrow-1000  . sevelamer carbonate  800 mg Oral TID WC  . sodium chloride flush  10-40 mL Intracatheter Q12H    Infusions: . feeding supplement (VITAL 1.5 CAL) 1,000 mL (05/18/19 1835)  . heparin 10,000 units/ 20 mL infusion syringe 1,000 Units/hr (05/15/19 0624)  . lactated ringers Stopped (04/30/2019 1640)  . lactated ringers Stopped (04/30/19 0930)  . norepinephrine (LEVOPHED) Adult infusion 10 mcg/min (05/19/19 0700)    PRN Medications: Gerhardt's butt cream, heparin, levalbuterol, ondansetron (ZOFRAN) IV, ondansetron (ZOFRAN) IV, oxyCODONE, sodium chloride flush     Assessment/Plan   1. Acute systolic HF ->  Cardiogenic Shock  - Due to iCM. EF 20-25% - Impella 5.5 placed on 4/2.   - s/p CABG/MVRepair on 4/6  - Impella out 4/9.  - Echo 4/10 with EF 25-30%,  normal RV, no MR.  - Echo 4/22 EF 30-35% mildly reduced RV MVR ok    - Still/pressor/inotrope dependent. Milrinone off. On NE 9 + midodrine 15 tid. Cuff MAPs mid 60s. Co-ox 65%. Try to wean NE as tolerated.  - Volume status looks good/ managed through HD. Remains anuric. Continue HD per nephrology.    2. CAD - LHC with severe 3 vessel CAD  - No s/s angina - s/p CABG x 3 MV Repair 4/6. Echo stable - on ASA/Crestor  3. Acute hypoxic respiratory failure - Due to pulmonary edema and PNA -  Extubated 4/9 re-intubated on 4/12. Failed re-extubation on 4/14. Now s/p tracheostomy - Completed antibiotic course.  - S/P Bronch 4/27 - 60K yeast. -> ? Colonization.  - Remains vented through trach. On TCT during the day. - respiratory status improved with volume removal. CXR improved. - Follow WBC trend  - May need LTAC  4. Mitral Regurgitation - Mod-severe on ECHO - s/p MV repair, TEE 4/9 with minimal MR s/p repair.   - Echo 4/10 with no significant MR.  - echo 4/22 MVR stable  5. Uncontrolled DM - Hgb A1C 9.  - On insulin and sliding scale.  - No change  6. AKI - due to shock - Remains anuric post-op - Volume status looks good. Off CVVHD. Now iHD. - Appreciate nephrology's assistance    7. Acute blood loss Anemia  - Hgb 9.7 - No obvious bleeding.  - transfuse hgb < 7.5   8. Polymorphic VT - Recurrent VT during respiratory distress 4/14, - Off amio  - No further VT.  - Keep K> 4.0 Mg > 2.0  - no change  9. Severe Malnutrition - Prealbumin 8.9  - Nutrition on board - Continue w/ TFs  10. Atrial fibrillation. paroxysmal -  Remains in NSR.  - Amiodarone was stopped.   76. Debility, severe - will need SNF vs LTAC on d/c  12. Chronic pain issues -4/27  Pain was not controlled. Using IV fentanyl  ~14 x a day. Switched to oxycontin 10 mg q 6 hours. Improved   Lyda Jester, PA-C  05/19/2019 7:53 AM    Agree with above.   He remains on trach collar. Has been of  vent for several days. Remains anuric. NE dose up this am in setting of HD.Remains very weak. Requires full assistance to get up.  CXR stable  General:  In bed. Cachetic and weak appearing. No resp difficulty HEENT: normal Neck: supple. + trach  no JVD. Carotids 2+ bilat; no bruits. No lymphadenopathy or thryomegaly appreciated. Cor: PMI nondisplaced. Regular rate & rhythm. No rubs, gallops or murmurs. L [place] cath Lungs: coarse Abdomen: soft, nontender, nondistended. No hepatosplenomegaly. No bruits or masses. Good bowel sounds. Extremities: no cyanosis, clubbing, rash, edema Neuro: alert & orientedx3, cranial nerves grossly intact. moves all 4 extremities w/o difficulty. Affect pleasant  He remains very tenuous. Still pressor dependent. Anuric on HD. Will continue to try to wean NE and have him stable on iHD. Will need LTAC. Recheck prealbumin.   CRITICAL CARE Performed by: Glori Bickers  Total critical care time: 35 minutes  Critical care time was exclusive of separately billable procedures and treating other patients.  Critical care was necessary to treat or prevent imminent or life-threatening deterioration.  Critical care was time spent personally by me (independent of midlevel providers or residents) on the following activities: development of treatment plan with patient and/or surrogate as well as nursing, discussions with consultants, evaluation of patient's response to treatment, examination of patient, obtaining history from patient or surrogate, ordering and performing treatments and interventions, ordering and review of laboratory studies, ordering and review of radiographic studies, pulse oximetry and re-evaluation of patient's condition.  Glori Bickers, MD  5:13 PM

## 2019-05-19 NOTE — Progress Notes (Signed)
Nutrition Follow-up  DOCUMENTATION CODES:   Severe malnutrition in context of chronic illness, Underweight  INTERVENTION:   Tube Feeding via Cortrak:  Nepro at 50 ml/hr Pro-Stat 30 mL BID Provides 127 g of protein, 2360 kcals and 876 mL of free water Meets 100% estimated calorie and protein needs  NUTRITION DIAGNOSIS:   Severe Malnutrition related to chronic illness as evidenced by severe muscle depletion, severe fat depletion.  Being addressed via TF   GOAL:   Patient will meet greater than or equal to 90% of their needs  Progressing  MONITOR:   Vent status, Diet advancement, Labs, Weight trends, TF tolerance  REASON FOR ASSESSMENT:   Consult Poor PO, Assessment of nutrition requirement/status  ASSESSMENT:   64 yo male admitted with acute systeolic CHF, CAD with 3 vessel disease with plan for CABG. PMH includes DM with HgbA1c 9 with neuropathy, HTN  3/30 Cardiac Cath with severe 3-vessel CAD 3/31 ECHO: EF 30-35% 4/01 IABP placed, Intubated 4/02 Impella placed, bilateral CT placed for pleural effusions 4/04 Extubated 4/06 CABG, MV repair, Intubated 4/07 Extubated 4/09 Impella removed, LVAD placed, Cortrak placed, TF initiated 4/10 CRRT initiated 4/12 Re-intubated 4/14 Extubated 4/15 Re-intubated 4/16 Trach, Bronchoscopy 4/27 Bronch 4/29 CRRT discontinued 4/30 off vent, TC x 24 hours 5/01 1st iHD   Off vent since 4/30, Tolerating trach collar x 5 days. Pt remains very weak  HD on 5/1 with 1 L UF, HD again today. Pt on CRRT from 4/10 to 4/29  Remains NPO, SLP following but pt remains inappropriate for diet advancement TF changed to Nepro at 40 ml/hr today per MD.   Phosphorus very high; previously low while on CRRT. Started on Renvela today  Current weight 57.3 kg post HD, pre-HD 58.9 kg.   Labs: phosphorus >30 (H), potassium 6.4 (H), BUN 117, Creatinine 3.77, CBGs 88-242, sodium 132 (L) Meds: B-complex with C, ss novolog, novolog q 4 hours,  lantus, reglan, renvela  Diet Order:   Diet Order            Diet NPO time specified Except for: Ice Chips  Diet effective now              EDUCATION NEEDS:   Education needs have been addressed  Skin:  Skin Assessment: Skin Integrity Issues: Skin Integrity Issues:: Stage II, DTI DTI: Coccyx Stage II: perineum Incisions: R axilla, chest, bilateral legs, neck  Last BM:  5/3 rectal tube  Height:   Ht Readings from Last 1 Encounters:  04/30/2019 5\' 11"  (1.803 m)    Weight:   Wt Readings from Last 1 Encounters:  05/19/19 58.9 kg    BMI:  Body mass index is 18.11 kg/m.  Estimated Nutritional Needs:   Kcal:  2100-2300 kcals  Protein:  120-135 g  Fluid:  >/= 1.8 L/day   Kerman Passey MS, RDN, LDN, CNSC RD Pager Number and RD On-Call Pager Number Located in [place]

## 2019-05-19 NOTE — Progress Notes (Signed)
17 Days Post-Op Procedure(s) (LRB): VIDEO BRONCHOSCOPY USING DISPOSABLE ANESTHESIA SCOPE (N/A) TRACHEOSTOMY (N/A) Subjective: On HD for second time- norepinephrine requirement has been increased NSR Co-ox > 60% On trach collar 5th day On diflucan Objective: Vital signs in last 24 hours: Temp:  [97.7 F (36.5 C)-98.5 F (36.9 C)] 97.8 F (36.6 C) (05/03 0715) Pulse Rate:  [46-83] 75 (05/03 0830) Cardiac Rhythm: Normal sinus rhythm (05/03 0800) Resp:  [8-24] 16 (05/03 0830) BP: (83-113)/(42-64) 89/47 (05/03 0830) SpO2:  [79 %-100 %] 96 % (05/03 0830) FiO2 (%):  [35 %] 35 % (05/03 0809) Weight:  [56.9 kg-58.9 kg] 58.9 kg (05/03 0700)  Hemodynamic parameters for last 24 hours:  stable  Intake/Output from previous day: 05/02 0701 - 05/03 0700 In: 1281.5 [I.V.:81.5; NG/GT:1200] Out: -  Intake/Output this shift: Total I/O In: 163.9 [I.V.:8.9; NG/GT:155] Out: -       Exam    General- alert and comfortable, breathing through trach collar    Neck- no JVD, no cervical adenopathy palpable, no carotid bruit   Lungs- clear without rales, wheezes   Cor- regular rate and rhythm, no murmur , gallop. Sternal incision clean   Abdomen- soft, non-tender   Extremities - warm, non-tender, minimal edema   Neuro- oriented, appropriate, no focal weakness   Lab Results: Recent Labs    05/18/19 0336 05/19/19 0340  WBC 11.5* 14.1*  HGB 9.6* 9.7*  HCT 31.5* 31.7*  PLT 151 138*   BMET:  Recent Labs    05/18/19 0336 05/19/19 0340  NA 135 132*  K 4.6 6.4*  CL 95* 90*  CO2 28 26  GLUCOSE 161* 250*  BUN 75* 117*  CREATININE 2.64* 3.77*  CALCIUM 8.1* 8.2*    PT/INR: No results for input(s): LABPROT, INR in the last 72 hours. ABG    Component Value Date/Time   PHART 7.369 05/15/2019 0443   HCO3 27.3 05/15/2019 0443   TCO2 29 05/15/2019 0443   ACIDBASEDEF 3.0 (H) 04/28/2019 0735   O2SAT 64.5 05/19/2019 0340   CBG (last 3)  Recent Labs    05/18/19 2329 05/19/19 0335  05/19/19 0738  GLUCAP 162* 225* 212*    Assessment/Plan: S/P Procedure(s) (LRB): VIDEO BRONCHOSCOPY USING DISPOSABLE ANESTHESIA SCOPE (N/A) TRACHEOSTOMY (N/A) Remains very weak , req assist x2 to sit up Pain appears bettrer controlled with oxycontin Now a month postop CABG/Impella with renal and pulmonary failure- because of expected further long term acute care needs may be candidate for LTAC. Will need to DC epicardial wires and get tunneled  Diatek catheter placed first.   LOS: 35 days    [name] 05/19/2019

## 2019-05-19 NOTE — Progress Notes (Signed)
NAME:  Brendan [name], MRN:  [id number], DOB:  03/26/1955, LOS: 30 ADMISSION DATE:  04/02/2019, CONSULTATION DATE:  04/17/19 REFERRING MD:  Dr. Haroldine Laws, CHIEF COMPLAINT:  Respiratory distress   Brief History   19 yoM originally presented with SOB and fatigue at [facility] found to have new HFrEF, NSTEMI, and bilateral pleural effusions transferred to [facility] on 3/29 for further cardiac evaluation. 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 Started on lasix and milrinone gtts however developed NSVT. Was taken 4/1 for placement of IABP and swan for optimization prior to CABG. Was on precedex for agitation/ confusion, some concern for DTs. On the evening of 4/1, patient developed worsening respiratory distress and hypoxia, PCCM consulted for intubation and vent management.  Patient was extubated on 4/14 and developed worsening hypoxia on BiPAP. Overnight he developed pulseless VT requiring 1 minute of CPR and epinephrine to achieve ROSC. Neurovascularly intact afterward. Subsequently had increased work of breathing and worsening respiratory distress requiring intubation. Patient had tracheostomy performed on 05/04/2019. Trying to wean off ventilator.   Past Medical History  Tobacco abuse, HTN, poorly controlled diabetes, diabetic neuropathy  Significant Hospital Events   3/30 Lt heart cath/TEE performed 4/1 Intubated, RHC/IABP/PA cath 4/2 Impella placement 4/3 febrile over evening and night. Blood cultures obtained. Vanc/cefepime started. Hematuria--heparin held. 4/4 Extubated,abx transitioned to rocephin. Heparin restarted 4/6 Re-intubated for CABG/MVR 4/7 Extubated from CABG to BiPAP 4/9 Impella removed  4/9 TEE 4/10 CVC inserted &CRRT initiated  4/12 PCCMre-consulted re-intubation 4/14 Extubated to BiPAP 4/15 Re-intubated due to respiratory distress 4/16 Tracheostomy 4/27 bronch with BAL  Consults:  TCTS PCCM HF Nephrology  Procedures:  4/15ET tube - 4/15 4/9 Cortrak> 4/6 Rt  IJ> 4/10 HD cath- 4/13 4/12 foley cath> 3/30 PICC> 4/13 HD cath> 4/16 Tracheostomy>  Significant Diagnostic Tests:  3/30 R/ LHC >>  Ost LM to Mid LM lesion is 35% stenosed.  Prox LAD lesion is 90% stenosed.  Prox LAD to Mid LAD lesion is 50% stenosed.  Mid Cx lesion is 100% stenosed.  Prox RCA to Mid RCA lesion is 90% stenosed.  RPDA lesion is 95% stenosed.  LV end diastolic pressure is severely elevated.  Hemodynamic findings consistent with moderate pulmonary hypertension. 1. Severe 3 vessel obstructive CAD 2. High LV filling pressures 3. Reduced cardiac output with index 2.3 4. Moderate pulmonary HTN.  3/30TTE >> 1. Left ventricular ejection fraction, by estimation, is 30 to 35%. The left ventricle has moderately decreased function. The left ventricle demonstrates regional wall motion abnormalities.There is mild left ventricular hypertrophy. Left ventricular diastolic function could not be evaluated. There is severe hypokinesis of the left ventricular, entire inferior wall, inferoseptal wall, apical segment and lateral wall.  2. Right ventricular systolic function is hyperdynamic. The right ventricular size is normal. There is moderately elevated pulmonary artery systolic pressure.  3. Decreased posterior leaflet motion due to ischemic tethering of the mitral valve leaflets.  4. The mitral valve is abnormal. Moderate to severe mitral valve regurgitation.  5. The aortic valve is tricuspid. Aortic valve regurgitation is not visualized. Mild aortic valve sclerosis is present, with no evidence of aortic valve stenosis.  6. The inferior vena cava is normal in size with greater than 50% respiratory variability, suggesting right atrial pressure of 3 mmHg.   4/1 CT chest w/o >>Large bilateral pleural effusions with associated atelectasis.Moderate to severe bilateral ground-glass opacities, likely reflecting pneumonia and/or edema.  4/1 RHC >> RA = 18 RV = 60/20  PA  = 63/29 (  45) PCW = 33 Fick cardiac output/index = 3.7/1.9 PVR = 2.5 WU FA sat = 98% PA sat = 47%, 49% PaPi = 2.2   Micro Data:  3/30 MRSA PCR >> neg 4/1 MRSA PCR >> neg 4/2 resp culture>>rare gram pos cocci, few candida albicans 4/3 Urine culture>>negative 4/4 blood cultures>>no growth  4/9 Surgical wound>> rare enterococcus faecalis 4/9 BCx2>>no gorwth 4/9 Urine Culture>> greater than 100k yeast 4/12 Resp >. Negative 4/16 BAL >> negative (AFB, fungal negative) 4/20 Resp >> rare yeast >>  4/20 blood >>  4/21 Resp >> rare yeast >>  4/26 trach aspirate>> few candida albicans 4/27 BAL fungus> yeast 4/27 BAL resp>>60K colonies yeast 4/27 BAL AFB>> negative 4/27 AFB culture >> 4/27 PJP smear>>   Antimicrobials:  PTA ceftriaxone/ doxycycline >> 3/29 Ceftriaxone 4/4-4/5 Cefepime 4/4>>4/11 vanc 4/4>>4/6 Rocephin 4/4>>4/5 Cefuroxime 4/6-4/7 Ampicillin 4/11 Vancomycin 4/12>>4/13 Cefepime 4/12>>4/13 Augmentin / unasyn 4/15> 4/20 Zosyn 4/20 >> 4/26 linezolid 4/20 >> 4/23 Fluconazole 4/28 >  Interim history/subjective:  Up in chair. No distress.   Objective   Blood pressure (!) 104/57, pulse 79, temperature 98 F (36.7 C), temperature source Oral, resp. rate 16, height _0  (1.803 m), weight 57.3 kg, SpO2 98 %.    FiO2 (%):  [35 %-60 %] 60 %   Intake/Output Summary (Last 24 hours) at 05/19/2019 1120 Last data filed at 05/19/2019 1100 Gross per 24 hour  Intake 1715.36 ml  Output --  Net 1715.36 ml   Filed Weights   05/19/19 0500 05/19/19 0700 05/19/19 1045  Weight: 56.9 kg 58.9 kg 57.3 kg    Examination:    General:  Frail gentleman who appears older than stated age.  Neuro:  Awake, alert, flat affect. Responds intermittently.  HEENT:  [place]/AT, No JVD noted, PERRL Cardiovascular:  RRR, no MRG Lungs:  Clear. Moderate trach secretions.  Abdomen:  Soft, non-distended Musculoskeletal:  No acute deformity Skin:  Intact, MMM    Resolved Hospital Problem list      Assessment & Plan:   Acute hypoxic respiratory failure, multifactorial.  Pulmonary edema improved with more aggressive UF, but increased sputum production. Possibly candida pneumonia vs colonization.   - off vent since 4/30, seems tenuous, but has been maintaining well. Moderate secretions.   Plan Continue trach collar as tolerated. Not a candidate for trach downsize yet.   Mobilize as able. Routine trach care Primary team considering LTAC   HFrEF (20-25%), cardiogenic shock, NSTEMI with three-vessel disease, NSVT Polymorphic VT Plan Per HF team   Severe Malnutrition -Continue enteral nutrition  AKI, possibly ATN in the setting of cardiogenic shock, anuric  Plan Per nephro For tunneled HD today.   Diabetes mellitus with hyperglycemia worsened with steroids Plan SSI  Elevated transaminase enzymes with cholestasis pattern, concern for congestive hepatopathy. Now on azole. Plan Monitor   Best practice:  Diet: Tube feeds Pain/Anxiety/Delirium protocol (if indicated):Fentanyl VAP protocol (if indicated):HOB 30 degrees,  DVT prophylaxis: heparin  GI prophylaxis: PPI Glucose control:SSI Mobility: BR Code Status: Full  Family Communication:per primary team Disposition:ICU   Georgann Housekeeper, AGACNP-BC [place] for personal pager PCCM on call pager [phone]  05/19/2019 11:26 AM

## 2019-05-20 LAB — RENAL FUNCTION PANEL
Albumin: 2.4 g/dL — ABNORMAL LOW (ref 3.5–5.0)
Anion gap: 13 (ref 5–15)
BUN: 61 mg/dL — ABNORMAL HIGH (ref 8–23)
CO2: 26 mmol/L (ref 22–32)
Calcium: 8.4 mg/dL — ABNORMAL LOW (ref 8.9–10.3)
Chloride: 92 mmol/L — ABNORMAL LOW (ref 98–111)
Creatinine, Ser: 2.57 mg/dL — ABNORMAL HIGH (ref 0.61–1.24)
GFR calc Af Amer: 30 mL/min — ABNORMAL LOW (ref >60)
GFR calc non Af Amer: 26 mL/min — ABNORMAL LOW (ref >60)
Glucose, Bld: 177 mg/dL — ABNORMAL HIGH (ref 70–99)
Phosphorus: 6.7 mg/dL — ABNORMAL HIGH (ref 2.5–4.6)
Potassium: 4.7 mmol/L (ref 3.5–5.1)
Sodium: 131 mmol/L — ABNORMAL LOW (ref 135–145)

## 2019-05-20 LAB — CBC
HCT: 30 % — ABNORMAL LOW (ref 39.0–52.0)
Hemoglobin: 9.3 g/dL — ABNORMAL LOW (ref 13.0–17.0)
MCH: 31.1 pg (ref 26.0–34.0)
MCHC: 31 g/dL (ref 30.0–36.0)
MCV: 100.3 fL — ABNORMAL HIGH (ref 80.0–100.0)
Platelets: 125 10*3/uL — ABNORMAL LOW (ref 150–400)
RBC: 2.99 MIL/uL — ABNORMAL LOW (ref 4.22–5.81)
RDW: 17.6 % — ABNORMAL HIGH (ref 11.5–15.5)
WBC: 9.6 10*3/uL (ref 4.0–10.5)
nRBC: 0 % (ref 0.0–0.2)

## 2019-05-20 LAB — COOXEMETRY PANEL
Carboxyhemoglobin: 2.2 % — ABNORMAL HIGH (ref 0.5–1.5)
Methemoglobin: 0.7 % (ref 0.0–1.5)
O2 Saturation: 68.3 %
Total hemoglobin: 9.2 g/dL — ABNORMAL LOW (ref 12.0–16.0)

## 2019-05-20 LAB — GLUCOSE, CAPILLARY
Glucose-Capillary: 147 mg/dL — ABNORMAL HIGH (ref 70–99)
Glucose-Capillary: 126 mg/dL — ABNORMAL HIGH (ref 70–99)
Glucose-Capillary: 146 mg/dL — ABNORMAL HIGH (ref 70–99)
Glucose-Capillary: 136 mg/dL — ABNORMAL HIGH (ref 70–99)
Glucose-Capillary: 116 mg/dL — ABNORMAL HIGH (ref 70–99)
Glucose-Capillary: 154 mg/dL — ABNORMAL HIGH (ref 70–99)

## 2019-05-20 LAB — MAGNESIUM: Magnesium: 2.4 mg/dL (ref 1.7–2.4)

## 2019-05-20 LAB — PREALBUMIN: Prealbumin: 18.7 mg/dL (ref 18–38)

## 2019-05-20 MED ORDER — ALUM & MAG HYDROXIDE-SIMETH 200-200-20 MG/5ML PO SUSP: 15.0000 mL | Freq: Two times a day (BID) | ORAL | Status: DC | PRN

## 2019-05-20 MED ORDER — PRO-STAT SUGAR FREE PO LIQD: 30.0000 mL | Freq: Two times a day (BID) | ORAL | 0 refills | Status: AC

## 2019-05-20 MED ORDER — MIDODRINE HCL 5 MG PO TABS: 15.0000 mg | ORAL_TABLET | Freq: Three times a day (TID) | ORAL | Status: AC

## 2019-05-20 MED ORDER — SEVELAMER CARBONATE 800 MG PO TABS: 800.0000 mg | ORAL_TABLET | Freq: Three times a day (TID) | ORAL | Status: DC

## 2019-05-20 MED ORDER — LEVALBUTEROL HCL 0.63 MG/3ML IN NEBU: 0.6300 mg | INHALATION_SOLUTION | Freq: Four times a day (QID) | RESPIRATORY_TRACT | 12 refills | Status: AC | PRN

## 2019-05-20 MED ORDER — B COMPLEX-C PO TABS: 1.0000 | ORAL_TABLET | Freq: Every day | ORAL | Status: AC

## 2019-05-20 MED ORDER — ASPIRIN 81 MG PO CHEW: 324.0000 mg | CHEWABLE_TABLET | Freq: Every day | ORAL | Status: AC

## 2019-05-20 MED ORDER — CHLORHEXIDINE GLUCONATE 0.12 % MT SOLN
OROMUCOSAL | Status: AC
  Filled 2019-05-20: qty 15

## 2019-05-20 MED ORDER — NITROGLYCERIN IN D5W 200-5 MCG/ML-% IV SOLN: 2.0000 ug/min | INTRAVENOUS | Status: DC

## 2019-05-20 MED ORDER — SEVELAMER CARBONATE 0.8 G PO PACK
0.8000 g | PACK | Freq: Three times a day (TID) | ORAL | Status: DC
  Administered 2019-05-20 – 2019-05-27 (×21): 0.8 g
  Filled 2019-05-20 (×24): qty 1

## 2019-05-20 MED ORDER — NEPRO/CARBSTEADY PO LIQD: 1000.0000 mL | ORAL | 0 refills | Status: AC

## 2019-05-20 MED ORDER — PANTOPRAZOLE SODIUM 40 MG PO PACK: 40.0000 mg | PACK | Freq: Two times a day (BID) | ORAL | Status: AC

## 2019-05-20 MED ORDER — INSULIN ASPART 100 UNIT/ML ~~LOC~~ SOLN: 2.0000 [IU] | SUBCUTANEOUS | 11 refills | Status: DC

## 2019-05-20 MED ORDER — INSULIN GLARGINE 100 UNIT/ML ~~LOC~~ SOLN: 16.0000 [IU] | Freq: Two times a day (BID) | SUBCUTANEOUS | 11 refills | Status: AC

## 2019-05-20 MED ORDER — PNEUMOCOCCAL VAC POLYVALENT 25 MCG/0.5ML IJ INJ: 0.5000 mL | INJECTION | INTRAMUSCULAR | Status: DC | PRN

## 2019-05-20 MED ORDER — ORAL CARE MOUTH RINSE: 15.0000 mL | Freq: Three times a day (TID) | OROMUCOSAL | 0 refills | Status: AC

## 2019-05-20 MED ORDER — SODIUM CHLORIDE 0.9 % IV SOLN: 1000.0000 [IU]/h | INTRAVENOUS | Status: AC

## 2019-05-20 NOTE — Progress Notes (Signed)
Patient ID: Brendan [name], male   DOB: 07-Jul-1955, 64 y.o.   MRN: [id number] S:Tolerated IHD yesterday with 1.5 L UF.  BP remains soft.   O:BP (!) 100/56   Pulse 66   Temp 97.7 F (36.5 C) (Oral)   Resp 17   Ht _0  (1.803 m) Comment: measured x 3  Wt 57.6 kg   SpO2 (!) 85%   BMI 17.71 kg/m   Intake/Output Summary (Last 24 hours) at 05/20/2019 0947 Last data filed at 05/20/2019 0800 Gross per 24 hour  Intake 1204.47 ml  Output 1502 ml  Net -297.53 ml   Intake/Output: I/O last 3 completed shifts: In: 2136.2 [I.V.:136.2; NG/GT:2000] Out: 1502 [Other:1502]  Intake/Output this shift:  Total I/O In: 5.1 [I.V.:5.1] Out: -  Weight change: 0.4 kg YYQ:MGNOIBBCW WM on trach collar in NAD CVS: no rub, RRR Resp: CTA Abd: +BS, soft, NT/ND Ext: no edema  Recent Labs  Lab 05/14/19 0505 05/14/19 0505 05/14/19 1625 05/14/19 1625 05/15/19 0302 05/15/19 0443 05/16/19 0356 05/16/19 0356 05/17/19 0329 05/17/19 0721 05/17/19 1320 05/18/19 0336 05/19/19 0340 05/19/19 1130 05/20/19 0322  NA 133*   < > 134*   < > 135   < > 134*   < > 134* 134* 136 135 132* 134* 131*  K 5.2*   < > 4.7   < > 5.0   < > 4.4   < > 6.2* 5.9* 4.2 4.6 6.4* 3.9 4.7  CL 100   < > 102   < > 101   < > 101   < > 98 95* 98 95* 90* 95* 92*  CO2 21*   < > 26   < > 27   < > 24  --  _1 --  26  GLUCOSE 223*   < > 182*   < > 226*   < > 153*   < > 160* 159* 199* 161* 250* 150* 177*  BUN 33*   < > 35*   < > 34*   < > 69*   < > 119* 127* 41* 75* 117* 35* 61*  CREATININE 0.92   < > 0.93   < > 0.91   < > 1.88*   < > 3.36* 3.49* 1.72* 2.64* 3.77* 1.40* 2.57*  ALBUMIN 2.3*  2.4*   < > 2.1*  --  2.2*  2.2*  --  2.0*  --  1.9*  --   --  2.1* 2.1*  --  2.4*  CALCIUM 8.5*   < > 8.2*   < > 8.4*   < > 8.4*  --  8.5* 8.7* 8.1* 8.1* 8.2*  --  8.4*  PHOS 3.6   < > 3.2  --  3.4  --  4.8*  --  8.2*  --   --  8.7* >30.0*  --  6.7*  AST 34  --   --   --  44*  --   --   --   --   --   --   --   --   --   --   ALT 54*  --    --   --  56*  --   --   --   --   --   --   --   --   --   --    < > = values in this interval not displayed.   Liver Function Tests: Recent Labs  Lab 05/14/19 0505 05/14/19 1625 05/15/19 0302 05/16/19 0356 05/18/19 0336 05/19/19 0340 05/20/19 0322  AST 34  --  44*  --   --   --   --   ALT 54*  --  56*  --   --   --   --   ALKPHOS 482*  --  469*  --   --   --   --   BILITOT 1.0  --  0.8  --   --   --   --   PROT 7.2  --  7.0  --   --   --   --   ALBUMIN 2.3*  2.4*   < > 2.2*  2.2*   < > 2.1* 2.1* 2.4*   < > = values in this interval not displayed.   No results for input(s): LIPASE, AMYLASE in the last 168 hours. No results for input(s): AMMONIA in the last 168 hours. CBC: Recent Labs  Lab 05/17/19 0653 05/17/19 0653 05/17/19 0721 05/17/19 0721 05/18/19 0336 05/18/19 0336 05/19/19 0340 05/19/19 1130 05/20/19 0322  WBC 17.6*   < > 18.4*   < > 11.5*  --  14.1*  --  9.6  HGB 11.0*   < > 10.5*   < > 9.6*   < > 9.7* 10.5* 9.3*  HCT 35.2*   < > 34.3*   < > 31.5*   < > 31.7* 31.0* 30.0*  MCV 98.9  --  98.0  --  98.7  --  100.3*  --  100.3*  PLT 212   < > 219   < > 151  --  138*  --  125*   < > = values in this interval not displayed.   Cardiac Enzymes: No results for input(s): CKTOTAL, CKMB, CKMBINDEX, TROPONINI in the last 168 hours. CBG: Recent Labs  Lab 05/19/19 1537 05/19/19 1931 05/19/19 2323 05/20/19 0339 05/20/19 0755  GLUCAP 72 85 152* 147* 126*    Iron Studies: No results for input(s): IRON, TIBC, TRANSFERRIN, FERRITIN in the last 72 hours. Studies/Results: No results found. Marland Kitchen aspirin EC  325 mg Oral Daily   Or  . aspirin  324 mg Per Tube Daily  . B-complex with vitamin C  1 tablet Per Tube Daily  . chlorhexidine gluconate (MEDLINE KIT)  15 mL Mouth Rinse BID  . Chlorhexidine Gluconate Cloth  6 each Topical Q0600  . docusate  200 mg Per Tube Daily  . feeding supplement (PRO-STAT SUGAR FREE 64)  30 mL Oral BID  . fluconazole  200 mg Per Tube Daily   . insulin aspart  0-24 Units Subcutaneous Q4H  . insulin aspart  2 Units Subcutaneous Q4H  . insulin glargine  16 Units Subcutaneous BID  . mouth rinse  15 mL Mouth Rinse 10 times per day  . metoCLOPramide (REGLAN) injection  5 mg Intravenous Q8H  . midodrine  15 mg Per Tube Q8H  . neomycin-polymyxin-hydrocortisone  3 drop Left EAR Q12H  . pantoprazole sodium  40 mg Per Tube BID  . sevelamer carbonate  0.8 g Per Tube TID WC  . sodium chloride flush  10-40 mL Intracatheter Q12H    BMET    Component Value Date/Time   NA 131 (L) 05/20/2019 0322   K 4.7 05/20/2019 0322   CL 92 (L) 05/20/2019 0322   CO2 26 05/20/2019 0322   GLUCOSE 177 (H) 05/20/2019 0322   BUN 61 (H) 05/20/2019 0322   CREATININE 2.57 (H) 05/20/2019  0141   CALCIUM 8.4 (L) 05/20/2019 0322   GFRNONAA 26 (L) 05/20/2019 0322   GFRAA 30 (L) 05/20/2019 0322   CBC    Component Value Date/Time   WBC 9.6 05/20/2019 0322   RBC 2.99 (L) 05/20/2019 0322   HGB 9.3 (L) 05/20/2019 0322   HCT 30.0 (L) 05/20/2019 0322   PLT 125 (L) 05/20/2019 0322   MCV 100.3 (H) 05/20/2019 0322   MCH 31.1 05/20/2019 0322   MCHC 31.0 05/20/2019 0322   RDW 17.6 (H) 05/20/2019 0322   LYMPHSABS 1.1 05/12/2019 0402   MONOABS 1.1 (H) 05/16/2019 0402   EOSABS 0.2 05/09/2019 0402   BASOSABS 0.0 05/05/2019 0402    Brief HPI: admitted to outside hospital on 3/26 with SOB, new low EF and NSTEMI txd to [facility] 3/29: 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  Admission Scr was 0.99.  Assessment/Plan:  1. Oliguric/anuric AKI- CRRT started on 4/10 and taken off 4/29.  Transitioned to IHD on 05/17/19 and today is on his second session but with low bp and requiring increase in levophed. 1. Remains oliguric/anuric. 2. Wean levophed as able 3. Plan for HD tomorrow as tolerated. 2. Vascular access- left subclavian temp HD catheter needs to be  removed as it has been in for over 2 weeks.   1. Appreciate IR assistance with tunneled HD catheter placement today and remove temp HD catheter. 3. CAD- 3 vessel with EF 25% s/p CABG and MVR 05/25/71 complicated by cardiogenic shock 4. Acute systolic CHF and cardiogenic shock- still on pressors and inotrope dependent.  Volume improved with CRRT/IHD 5. Acute hypoxic respiratory failure- s/p trach 6. DM- per primary 7. MR s/p MVR 8. Polymorphic VT- off amio 9. Severe debility- will need [place], MD [facility] [phone]

## 2019-05-20 NOTE — Progress Notes (Addendum)
Physical Therapy Treatment Patient Details Name: Brendan [name] MRN: [id number] DOB: 01-20-1955 Today's Date: 05/20/2019    History of Present Illness Pt presented with SOB and fatigue at [facility] found to have new HFrEF,  NSTEMI, and bilateral pleural effusions transferred to [facility] on 3/29 for further cardiac evaluation.  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  On the evening of 4/1, patient developed worsening respiratory distress and hypoxia, head R/L heart cath on 3/30 & R heart cath on 4/1 pt intubated on 4/1, extubated 4/4, had R & L chest tubes placed 4/2 due to bilateral pleural effusions, removed 4/5 and impella device placed 4/2.  CABG and MVR on 4/6. Extubated 4/7. Removal of impella 4/9. CVVH started 4/10 and off 4/30. Respiratory distress and reintubated 4/12. Extubated 4/14. Reintubated 4/15. Trach 4/16.  PMHx of Tobacco abuse, HTN, poorly controlled DM, diabetic neuropathy.  Stopped CRRT 4/29 and began IHD 5/1.  On trach collar 35% regularly as of 5/2.    PT Comments    Patient participating today with encouragement in EOB activity and in bed therex.  He was intent to communicate that the kidney doctor was going to have him undergo dialysis today and so he knew he needed to remain in bed.  Encouraged for EOB at least and pt was symptomatic more due to weakness and SOB with 35% trach collar SpO2 down to 81%, back up to 92% in supine.  Noted orthostatic readings more stable this session.  Feel patient remains appropriate for LTACH at d/c.  PT will continue to follow acutely.    Follow Up Recommendations  LTACH     Equipment Recommendations  Other (comment)(TBA)    Recommendations for Other Services       Precautions / Restrictions Precautions Precautions: Fall;Sternal Precaution Comments: flexiseal, 35% trach collar    Mobility  Bed Mobility Overal bed mobility: Needs Assistance Bed Mobility: Supine to Sit     Supine to sit: Mod assist;+2 for physical assistance Sit to supine:  Mod assist;+2 for physical assistance   General bed mobility comments: bringing legs off bed and assist for trunk and scooting to EOB,  to supine assist for legs and positioning trunk  Transfers                 General transfer comment: deferred sit to stand today pt symptomatic with sats dropping at EOB on 35% trach collar,  Ambulation/Gait                 Stairs             Wheelchair Mobility    Modified Rankin (Stroke Patients Only)       Balance Overall balance assessment: Needs assistance Sitting-balance support: Bilateral upper extremity supported;Feet supported Sitting balance-Leahy Scale: Poor Sitting balance - Comments: rounded spine at EOB unable to lift trunk with UE support so towel at back to bring into anterior pelvic tilt with improved upright posture while supported,       Standing balance comment: unable to tolerate standing trial this session                            Cognition Arousal/Alertness: Awake/alert Behavior During Therapy: WFL for tasks assessed/performed Overall Cognitive Status: Difficult to assess                                 General  Comments: follows one step commands, communicating via writing this session that Kidney doctor wanted him to have dialysis today so needed to return to bed.      Exercises General Exercises - Upper Extremity Shoulder Flexion: AROM;Both;10 reps;Supine Elbow Flexion: AROM;Both;10 reps;Supine Elbow Extension: AROM;5 reps;10 reps;Supine Wrist Flexion: AROM;10 reps;Both;Supine Wrist Extension: AROM;Both;10 reps;Supine Digit Composite Flexion: AROM;Both;10 reps;Supine Composite Extension: AROM;Both;Supine Other Exercises Other Exercises: supine bridge x 10  3 sec hold; low trunk rotation x 10 AAROM    General Comments General comments (skin integrity, edema, etc.): BP supine 92/52, sitting 95/54, sitting after few minutes 98/58 but pt symptomatic with feeling  dizzy and general faigue after sitting few minutes attempting UE HEP      Pertinent Vitals/Pain Pain Assessment: Faces Faces Pain Scale: Hurts a little bit Pain Location: generalized Pain Descriptors / Indicators: Grimacing Pain Intervention(s): Monitored during session    Home Living                      Prior Function            PT Goals (current goals can now be found in the care plan section) Acute Rehab PT Goals Patient Stated Goal: not stated Progress towards PT goals: Progressing toward goals    Frequency    Min 3X/week      PT Plan Current plan remains appropriate    Co-evaluation PT/OT/SLP Co-Evaluation/Treatment: Yes Reason for Co-Treatment: For patient/therapist safety;To address functional/ADL transfers PT goals addressed during session: Mobility/safety with mobility;Balance;Strengthening/ROM OT goals addressed during session: Strengthening/ROM      AM-PAC PT "6 Clicks" Mobility   Outcome Measure  Help needed turning from your back to your side while in a flat bed without using bedrails?: A Lot Help needed moving from lying on your back to sitting on the side of a flat bed without using bedrails?: A Lot Help needed moving to and from a bed to a chair (including a wheelchair)?: Total Help needed standing up from a chair using your arms (e.g., wheelchair or bedside chair)?: Total Help needed to walk in hospital room?: Total Help needed climbing 3-5 steps with a railing? : Total 6 Click Score: 8    End of Session Equipment Utilized During Treatment: Oxygen Activity Tolerance: Patient limited by fatigue Patient left: in bed;with call bell/phone within reach Nurse Communication: Mobility status PT Visit Diagnosis: Other abnormalities of gait and mobility (R26.89);Muscle weakness (generalized) (M62.81)     Time: 6073-7106 PT Time Calculation (min) (ACUTE ONLY): 28 min  Charges:  $Therapeutic Exercise: 8-22 mins                     Magda Kiel, PT Acute Rehabilitation Services [phone] 05/20/2019    [name] 05/20/2019, 4:02 PM

## 2019-05-20 NOTE — Progress Notes (Addendum)
[facility] DAILY ICU PROGRESS NOTE                   [address]            [place] [address]          [phone]   18 Days Post-Op Procedure(s) (LRB): VIDEO BRONCHOSCOPY USING DISPOSABLE ANESTHESIA SCOPE (N/A) TRACHEOSTOMY (N/A)  Total Length of Stay:  LOS: 36 days   Subjective: Patient trying to converse with his wife at bedside.  Objective: Vital signs in last 24 hours: Temp:  [97 F (36.1 C)-98.6 F (37 C)] 97.7 F (36.5 C) (05/04 0340) Pulse Rate:  [37-103] 67 (05/04 0700) Cardiac Rhythm: Normal sinus rhythm (05/04 0400) Resp:  [9-23] 12 (05/04 0700) BP: (85-129)/(43-71) 97/56 (05/04 0700) SpO2:  [73 %-100 %] 92 % (05/04 0700) FiO2 (%):  [35 %-60 %] 35 % (05/04 0307) Weight:  [57.3 kg-57.6 kg] 57.6 kg (05/04 0500)  Filed Weights   05/19/19 0700 05/19/19 1045 05/20/19 0500  Weight: 58.9 kg 57.3 kg 57.6 kg    Weight change: 0.4 kg      Intake/Output from previous day: 05/03 0701 - 05/04 0700 In: 1425.5 [I.V.:85.5; NG/GT:1340] Out: 1502   Intake/Output this shift: No intake/output data recorded.  Current Meds: Scheduled Meds: . aspirin EC  325 mg Oral Daily   Or  . aspirin  324 mg Per Tube Daily  . B-complex with vitamin C  1 tablet Per Tube Daily  . chlorhexidine gluconate (MEDLINE KIT)  15 mL Mouth Rinse BID  . Chlorhexidine Gluconate Cloth  6 each Topical Q0600  . docusate  200 mg Per Tube Daily  . feeding supplement (PRO-STAT SUGAR FREE 64)  30 mL Oral BID  . fluconazole  200 mg Per Tube Daily  . insulin aspart  0-24 Units Subcutaneous Q4H  . insulin aspart  2 Units Subcutaneous Q4H  . insulin glargine  16 Units Subcutaneous BID  . mouth rinse  15 mL Mouth Rinse 10 times per day  . metoCLOPramide (REGLAN) injection  5 mg Intravenous Q8H  . midodrine  15 mg Per Tube Q8H  . neomycin-polymyxin-hydrocortisone  3 drop Left EAR Q12H  . oxyCODONE  10 mg Per Tube Q6H  . pantoprazole sodium  40 mg Per Tube BID  . pneumococcal 23 valent  vaccine  0.5 mL Intramuscular Tomorrow-1000  . sevelamer carbonate  800 mg Oral TID WC  . sodium chloride flush  10-40 mL Intracatheter Q12H   Continuous Infusions: . feeding supplement (NEPRO CARB STEADY) 1,000 mL (05/19/19 1824)  . heparin 10,000 units/ 20 mL infusion syringe 1,000 Units/hr (05/15/19 0624)  . lactated ringers Stopped (05/03/2019 1640)  . lactated ringers Stopped (04/30/19 0930)  . norepinephrine (LEVOPHED) Adult infusion 4 mcg/min (05/20/19 0600)   PRN Meds:.diphenhydrAMINE, Gerhardt's butt cream, heparin, levalbuterol, ondansetron (ZOFRAN) IV, oxyCODONE, sodium chloride flush  Heart: RRR Lungs: Mostly clear Abdomen: Soft, non tender,  bowel sounds present Extremities: Ted hose in place Wound: Sternal dressing is clean and dry;right axillary wound is clean and dry (staples removed 04/29)  Lab Results: CBC: Recent Labs    05/19/19 0340 05/19/19 0340 05/19/19 1130 05/20/19 0322  WBC 14.1*  --   --  9.6  HGB 9.7*   < > 10.5* 9.3*  HCT 31.7*   < > 31.0* 30.0*  PLT 138*  --   --  125*   < > = values in this interval not displayed.   BMET:  Recent Labs    05/19/19 0340 05/19/19 0340 05/19/19 1130 05/20/19 0322  NA 132*   < > 134* 131*  K 6.4*   < > 3.9 4.7  CL 90*   < > 95* 92*  CO2 26  --   --  26  GLUCOSE 250*   < > 150* 177*  BUN 117*   < > 35* 61*  CREATININE 3.77*   < > 1.40* 2.57*  CALCIUM 8.2*  --   --  8.4*   < > = values in this interval not displayed.    CMET: Lab Results  Component Value Date   WBC 9.6 05/20/2019   HGB 9.3 (L) 05/20/2019   HCT 30.0 (L) 05/20/2019   PLT 125 (L) 05/20/2019   GLUCOSE 177 (H) 05/20/2019   CHOL 174 08/21/2017   TRIG 143 08/21/2017   HDL 57 08/21/2017   LDLCALC 88 08/21/2017   ALT 56 (H) 05/15/2019   AST 44 (H) 05/15/2019   NA 131 (L) 05/20/2019   K 4.7 05/20/2019   CL 92 (L) 05/20/2019   CREATININE 2.57 (H) 05/20/2019   BUN 61 (H) 05/20/2019   CO2 26 05/20/2019   TSH 0.925 04/08/2019   INR 1.6  (H) 05/01/2019   HGBA1C 9.0 (H) 03/28/2019   MICROALBUR 30 08/21/2017    PT/INR:  No results for input(s): LABPROT, INR in the last 72 hours. Radiology: No results found.  Assessment/Plan: S/P Procedure(s) (LRB): VIDEO BRONCHOSCOPY USING DISPOSABLE ANESTHESIA SCOPE (N/A) TRACHEOSTOMY (N/A)  1. CV-S/p removal of Impella on 04/09.  S/p V tach arrest 04/15. Previous a fib. SR with HR in the 70's this am.  On  Midodrine 15 mg tid. Also, on Nor epinephrine drip at 3 mcg/min. Receiving intermittent CVVHD but remains pressor dependent.   Co ox this am decreased to 68.3 (off Milrinone) 2. Pulmonary-S/p trach 04/16. .On trach collar most of the day and ventilator at night (FiO2% 35). C 3. Expected post op blood loss anemia-H and H this am decreased to 9.3 and 30 4. DM-CBGs 85/152/147. On Insulin. He is on Insulin and this has been increased for better glucose control over the last 2 days. He was on Metformin 1000 mg bid prior to surgery, but will continue on Insulin for now as NPO. Pre op HGA1C 9. He will need close medical follow up after discharge 5. AKI-Anuric. Creatinine increased to 2.57 this am. Nephrology following and arranging for CVVHD accordingly;he had CVVHD yesterday. 6. GI-severe malnutrition of chronic illness.  Cortrak, TFs. Speech pathology evaluation done yesterday and recommendations to be followed accordingly. Albumin increased to 2.4 7. Elevated transaminases (likely related to shock)-Last  AST 44, ALT 56, ALk phos 469 and have been stable   8. ID-Leukocytosis increased to WBC to 15,400 (likely related to aspiration). Previous yeast in sputum and was on Zyvox and Zosyn for broader coverage. Of note, he completed Fluconazole for Candida in urine. Completed Vanco and Cefepime for Enterococcus Faecalis. ESR was elevated so has been given Solu Medrol, which may increase WBC 9. Acute systolic heart failure-CVVHD helping with volume status 11. Extremely deconditioned-will need PT as  able. May need LTAC once ready for discharge 12. Remove EPW and right anterior sutures 13. Hopefully, to LTAC soon, once off pressor  [name] PA-C 05/20/2019 7:45 AM   patient examined and medical record reviewed,agree with above note.  The patient has shown improvement but is expected to have a significant further need for critical care -  case management consulted for LTAC evaluation  EPWs removed Sternal incision clean, dry OOB to chair with assistance  P Prescott Gum MD

## 2019-05-20 NOTE — Progress Notes (Signed)
      [address]       [place] [address]             [phone]      BP 106/60   Pulse 67   Temp 97.7 F (36.5 C) (Oral)   Resp 13   Ht 5\' 11"  (1.803 m) Comment: measured x 3  Wt 57.6 kg   SpO2 98%   BMI 17.71 kg/m    Intake/Output Summary (Last 24 hours) at 05/20/2019 1838 Last data filed at 05/20/2019 1800 Gross per 24 hour  Intake 1408.9 ml  Output -  Net 1408.9 ml   For HD tomorrow  Remo Lipps C. Roxan Hockey, MD [facility] [phone]

## 2019-05-20 NOTE — Progress Notes (Signed)
Occupational Therapy Treatment Patient Details Name: Brendan [name] MRN: [id number] DOB: 01-12-1956 Today's Date: 05/20/2019    History of present illness Pt presented with SOB and fatigue at [facility] found to have new HFrEF,  NSTEMI, and bilateral pleural effusions transferred to [facility] on 3/29 for further cardiac evaluation.  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  On the evening of 4/1, patient developed worsening respiratory distress and hypoxia, head R/L heart cath on 3/30 & R heart cath on 4/1 pt intubated on 4/1, extubated 4/4, had R & L chest tubes placed 4/2 due to bilateral pleural effusions, removed 4/5 and impella device placed 4/2.  CABG and MVR on 4/6. Extubated 4/7. Removal of impella 4/9. CVVH started 4/10 and off 4/30. Respiratory distress and reintubated 4/12. Extubated 4/14. Reintubated 4/15. Trach 4/16.  PMHx of Tobacco abuse, HTN, poorly controlled DM, diabetic neuropathy   OT comments  Pt with soft blood pressures today and sitting EOB with unsupported and supported sitting tasks; pt tolerating ~8 mins at EOB. Pt continues to have decreased activity tolerance. Pt performing BUE HEP at EOB minimally as pt was using BUEs to sit upright. Pt performing the full BUE HEP lying supine with HOB raised. Trach collar on 35% at rest; O2 sats remained 83-88% O2 with exertion and pt dyspneic and RN increasing to 60% on trach collar >94%. Pt would greatly benefit from continued OT skilled services for ADL and energy conservation tasks.     Follow Up Recommendations  LTACH    Equipment Recommendations  None recommended by OT    Recommendations for Other Services      Precautions / Restrictions Precautions Precautions: Fall;Sternal Precaution Comments: flexiseal, 35% trach collarincreased to 60% during session       Mobility Bed Mobility Overal bed mobility: Needs Assistance Bed Mobility: Supine to Sit     Supine to sit: Mod assist;+2 for physical assistance Sit to supine: Mod assist;+2  for physical assistance   General bed mobility comments: bringing legs off bed and assist for trunk and scooting to EOB,  to supine assist for legs and positioning trunk  Transfers                 General transfer comment: deferred sit to stand today pt symptomatic with sats dropping at EOB on 35% trach collar,    Balance Overall balance assessment: Needs assistance Sitting-balance support: Bilateral upper extremity supported;Feet supported Sitting balance-Leahy Scale: Poor Sitting balance - Comments: rounded shoulders; exercises performed to arch back and extend spine       Standing balance comment: unable to tolerate standing trial this session                           ADL either performed or assessed with clinical judgement   ADL Overall ADL's : Needs assistance/impaired                                     Functional mobility during ADLs: Moderate assistance;+2 for physical assistance General ADL Comments: Pt performing BUE HEP at EOB minimally as pt was using BUEs to sit upright. Pt with decreased ability to care for self, decreased strength, decreased mobility and decreased activity tolerance.     Vision   Vision Assessment?: No apparent visual deficits   Perception     Praxis      Cognition Arousal/Alertness: Awake/alert  Behavior During Therapy: WFL for tasks assessed/performed Overall Cognitive Status: Difficult to assess                                 General Comments: follows one step commands, communicating via writing this session that Kidney doctor wanted him to have dialysis today so needed to return to bed.        Exercises Exercises: Other exercises;General Upper Extremity General Exercises - Upper Extremity Shoulder Flexion: AROM;Both;10 reps;Supine Elbow Flexion: AROM;Both;10 reps;Supine Elbow Extension: AROM;5 reps;10 reps;Supine Wrist Flexion: AROM;10 reps;Both;Supine Wrist Extension: AROM;Both;10  reps;Supine Digit Composite Flexion: AROM;Both;10 reps;Supine Composite Extension: AROM;Both;Supine Other Exercises Other Exercises: trunk supported and unsupported sitting   Shoulder Instructions       General Comments BP supine 92/52, sitting 95/54, sitting after few minutes 98/58 but pt symptomatic with feeling dizzy and general faigue after sitting few minutes attempting UE HEP    Pertinent Vitals/ Pain       Pain Assessment: Faces Faces Pain Scale: Hurts a little bit Pain Location: generalized Pain Descriptors / Indicators: Grimacing Pain Intervention(s): Monitored during session  Home Living                                          Prior Functioning/Environment              Frequency  Min 2X/week        Progress Toward Goals  OT Goals(current goals can now be found in the care plan section)  Progress towards OT goals: Progressing toward goals  Acute Rehab OT Goals Patient Stated Goal: not stated OT Goal Formulation: Patient unable to participate in goal setting Time For Goal Achievement: 05/26/19 Potential to Achieve Goals: Good ADL Goals Pt Will Perform Grooming: with supervision;sitting Pt Will Perform Lower Body Dressing: with min assist;with adaptive equipment;sitting/lateral leans;sit to/from stand Pt/caregiver will Perform Home Exercise Program: Increased strength;Both right and left upper extremity;With Supervision Additional ADL Goal #1: Pt  will verbalize 3 strategies to conserve energy during functional ADL and mobility tasks with no verbal cues Additional ADL Goal #2: Pt will increase to minA overall for bed mobility as precursor for OOB ADL.  Plan Discharge plan remains appropriate    Co-evaluation    PT/OT/SLP Co-Evaluation/Treatment: Yes Reason for Co-Treatment: For patient/therapist safety PT goals addressed during session: Mobility/safety with mobility;Balance;Strengthening/ROM OT goals addressed during session:  Strengthening/ROM      AM-PAC OT "6 Clicks" Daily Activity     Outcome Measure   Help from another person eating meals?: A Little Help from another person taking care of personal grooming?: A Little Help from another person toileting, which includes using toliet, bedpan, or urinal?: A Lot Help from another person bathing (including washing, rinsing, drying)?: A Lot Help from another person to put on and taking off regular upper body clothing?: A Lot Help from another person to put on and taking off regular lower body clothing?: A Lot 6 Click Score: 14    End of Session Equipment Utilized During Treatment: Gait belt;Oxygen  OT Visit Diagnosis: Unsteadiness on feet (R26.81);Muscle weakness (generalized) (M62.81);Pain Pain - part of body: ("all over")   Activity Tolerance Patient tolerated treatment well   Patient Left in bed;with call bell/phone within reach;with bed alarm set   Nurse Communication Mobility status  Time: 1020-1100 OT Time Calculation (min): 40 min  Charges: OT General Charges $OT Visit: 1 Visit OT Treatments $Therapeutic Exercise: 8-22 mins  Jefferey Pica, OTR/L Acute Rehabilitation Services Pager: [phone] Office: [phone]   Jae Skeet C 05/20/2019, 2:24 PM

## 2019-05-20 NOTE — Progress Notes (Addendum)
Patient ID: Brendan [name], male   DOB: 01/29/1955, 64 y.o.   MRN: [id number]     Advanced Heart Failure Rounding Note  PCP-Cardiologist: No primary care provider on file.   Subjective:    Events - Presented to [facility] with acute HF  EF 20-25% - Transferred [facility] - Cath 3/30 with severe 3 vessel disease. EF 25% - Developed PMVT on milrinone -> milrinone stopped -> developed worsening shock -> IABP placed on 4/1 - Clinical deterioration 4/1-> intubated - 4/2 underwent Impella 5.5 placement earlier and bilateral chest tubes with 3L out from each side.  - Extubated 4/4 - 05/10/2019 CABG and MVR - Extubated 4/7 - Back to OR for Impella 5.5 extraction 4/9, extubated.  TEE with EF 35%, RV ok, trivial MR s/p MV repair.  - CVVH started 4/10 with oliguria, rising creatinine and CVP.  - Deteriorating respiratory status, intubated again early am 4/12.  Enterococcus faecalis in sputum.  - Extubated 4/14.  Developed respiratory distress and started on Bipap.  Had VT arrest with CPR/epinephrine, intubated again 4/15 early am.  - Tracheostomy 4/16 - Atrial fibrillation early am 4/17 -Echo 4/22: EF 30-35% RV mildly reduced. MVR looks good. - S/P Bronchoscopy 4/27   -CVVHD stopped 4/29. Had first HD on 5/1.  Remains anuric. Continues iHD but remains pressor dependent. Required increased dose of NE w/ HD session yesterday. Currently on NE 2 + midodrine 15 mg tid.  Co-ox 68%. Cuff MAPs in the 70s.    Volume status ok. CVP 9.   WBC now WNL. AF  No complaints. Remains very weak.   Prealbumin much improved, up from 8.9>>18.7.     Objective:   Weight Range: 57.6 kg Body mass index is 17.71 kg/m.   Vital Signs:   Temp:  [97 F (36.1 C)-98.6 F (37 C)] 97.7 F (36.5 C) (05/04 0340) Pulse Rate:  [37-103] 66 (05/04 0751) Resp:  [9-23] 16 (05/04 0751) BP: (85-129)/(43-71) 104/58 (05/04 0751) SpO2:  [73 %-100 %] 96 % (05/04 0751) FiO2 (%):  [35 %-60 %] 35 % (05/04 0751) Weight:  [57.3  kg-57.6 kg] 57.6 kg (05/04 0500) Last BM Date: 05/19/19  Weight change: Filed Weights   05/19/19 0700 05/19/19 1045 05/20/19 0500  Weight: 58.9 kg 57.3 kg 57.6 kg    Intake/Output:   Intake/Output Summary (Last 24 hours) at 05/20/2019 0805 Last data filed at 05/20/2019 0600 Gross per 24 hour  Intake 1261.58 ml  Output 1502 ml  Net -240.42 ml      Physical Exam   CVP 9  General:  cachetic appearing WM. Looks a bit more alert today, less fatigue looking. No respiratory difficulty HEENT: + TC, + NGT  Neck: supple. no JVD. Carotids 2+ bilat; no bruits. No lymphadenopathy or thyromegaly appreciated. Cor: PMI nondisplaced. Regular rate & rhythm. No rubs, gallops or murmurs. Lungs: clear, no wheezing  Abdomen: soft, nontender, nondistended. No hepatosplenomegaly. No bruits or masses. Good bowel sounds. Extremities: no cyanosis, clubbing, rash, thin extremities no edema Neuro: alert & oriented x 3, cranial nerves grossly intact. moves all 4 extremities w/o difficulty. Affect pleasant.   Telemetry    NSR 70s-80s  Labs    CBC Recent Labs    05/19/19 0340 05/19/19 0340 05/19/19 1130 05/20/19 0322  WBC 14.1*  --   --  9.6  HGB 9.7*   < > 10.5* 9.3*  HCT 31.7*   < > 31.0* 30.0*  MCV 100.3*  --   --  100.3*  PLT 138*  --   --  125*   < > = values in this interval not displayed.   Basic Metabolic Panel Recent Labs    05/19/19 0340 05/19/19 0340 05/19/19 1130 05/20/19 0322  NA 132*   < > 134* 131*  K 6.4*   < > 3.9 4.7  CL 90*   < > 95* 92*  CO2 26  --   --  26  GLUCOSE 250*   < > 150* 177*  BUN 117*   < > 35* 61*  CREATININE 3.77*   < > 1.40* 2.57*  CALCIUM 8.2*  --   --  8.4*  MG 2.7*  --   --  2.4  PHOS >30.0*  --   --  6.7*   < > = values in this interval not displayed.   Liver Function Tests Recent Labs    05/19/19 0340 05/20/19 0322  ALBUMIN 2.1* 2.4*   No results for input(s): LIPASE, AMYLASE in the last 72 hours. Cardiac Enzymes No results for  input(s): CKTOTAL, CKMB, CKMBINDEX, TROPONINI in the last 72 hours.  BNP: BNP (last 3 results) Recent Labs    04/13/2019 0113  BNP 655.7*    ProBNP (last 3 results) No results for input(s): PROBNP in the last 8760 hours.   D-Dimer No results for input(s): DDIMER in the last 72 hours. Hemoglobin A1C No results for input(s): HGBA1C in the last 72 hours. Fasting Lipid Panel No results for input(s): CHOL, HDL, LDLCALC, TRIG, CHOLHDL, LDLDIRECT in the last 72 hours. Thyroid Function Tests No results for input(s): TSH, T4TOTAL, T3FREE, THYROIDAB in the last 72 hours.  Invalid input(s): FREET3  Other results:   Imaging    No results found.   Medications:     Scheduled Medications: . aspirin EC  325 mg Oral Daily   Or  . aspirin  324 mg Per Tube Daily  . B-complex with vitamin C  1 tablet Per Tube Daily  . chlorhexidine gluconate (MEDLINE KIT)  15 mL Mouth Rinse BID  . Chlorhexidine Gluconate Cloth  6 each Topical Q0600  . docusate  200 mg Per Tube Daily  . feeding supplement (PRO-STAT SUGAR FREE 64)  30 mL Oral BID  . fluconazole  200 mg Per Tube Daily  . insulin aspart  0-24 Units Subcutaneous Q4H  . insulin aspart  2 Units Subcutaneous Q4H  . insulin glargine  16 Units Subcutaneous BID  . mouth rinse  15 mL Mouth Rinse 10 times per day  . metoCLOPramide (REGLAN) injection  5 mg Intravenous Q8H  . midodrine  15 mg Per Tube Q8H  . neomycin-polymyxin-hydrocortisone  3 drop Left EAR Q12H  . pantoprazole sodium  40 mg Per Tube BID  . pneumococcal 23 valent vaccine  0.5 mL Intramuscular Tomorrow-1000  . sevelamer carbonate  800 mg Oral TID WC  . sodium chloride flush  10-40 mL Intracatheter Q12H    Infusions: . feeding supplement (NEPRO CARB STEADY) 1,000 mL (05/19/19 1824)  . heparin 10,000 units/ 20 mL infusion syringe 1,000 Units/hr (05/15/19 0624)  . lactated ringers Stopped (05/05/2019 1640)  . norepinephrine (LEVOPHED) Adult infusion 4 mcg/min (05/20/19 0600)     PRN Medications: alum & mag hydroxide-simeth, diphenhydrAMINE, Gerhardt's butt cream, heparin, levalbuterol, ondansetron (ZOFRAN) IV, oxyCODONE, sodium chloride flush     Assessment/Plan   1. Acute systolic HF ->  Cardiogenic Shock  - Due to iCM. EF 20-25% - Impella 5.5 placed on 4/2.   - s/p  CABG/MVRepair on 4/6  - Impella out 4/9.  - Echo 4/10 with EF 25-30%, normal RV, no MR.  - Echo 4/22 EF 30-35% mildly reduced RV MVR ok    - Still pressor/ionotrope dependent. Milrinone off. Weaning down NE, now at 2 + midodrine 15 tid. Cuff MAPs 70s. Co-ox 68%. Continue to wean NE as tolerated.  - Volume status looks good/ managed through HD. CVP 9. Remains anuric. Continue HD per nephrology.    2. CAD - LHC with severe 3 vessel CAD  - No s/s angina - s/p CABG x 3 MV Repair 4/6. Echo stable - on ASA/Crestor  3. Acute hypoxic respiratory failure - Due to pulmonary edema and PNA - Extubated 4/9 re-intubated on 4/12. Failed re-extubation on 4/14. Now s/p tracheostomy - Completed antibiotic course.  - S/P Bronch 4/27 - 60K yeast. -> ? Colonization.  - Remains vented through trach. On TCT during the day. - respiratory status improved with volume removal. CXR improved. - WBC WNL - May need LTAC  4. Mitral Regurgitation - Mod-severe on ECHO - s/p MV repair, TEE 4/9 with minimal MR s/p repair.   - Echo 4/10 with no significant MR.  - echo 4/22 MVR stable  5. Uncontrolled DM - Hgb A1C 9.  - On insulin and sliding scale.  - No change  6. AKI - due to shock - Remains anuric post-op - Volume status looks good. Off CVVHD. Now iHD. - Appreciate nephrology's assistance    7. Acute blood loss Anemia  - Hgb 9.3 - No obvious bleeding.  - transfuse hgb < 7.5   8. Polymorphic VT - Recurrent VT during respiratory distress 4/14, - Off amio  - No further VT.  - Keep K> 4.0 Mg > 2.0  - no change  9. Severe Malnutrition - Prealbumin much improved, up from 8.9>>18.7 - Nutrition  on board - Continue w/ TFs  10. Atrial fibrillation. paroxysmal -  Remains in NSR.  - Amiodarone was stopped.   39. Debility, severe - will likely need LTAC on d/c  12. Chronic pain issues -4/27  Pain was not controlled. Using IV fentanyl  ~14 x a day. Switched to oxycontin 10 mg q 6 hours. Improved   Lyda Jester, PA-C  05/20/2019 8:05 AM     Agree with above.   Remains very weak. Still on NE at 4. Had to increase pressor support yesterday during iHD. Now weaning again. Remains on trach collar. Volume status ok. Co-ox  68%. Pre-albumin up to 19.   General:  Cachetic male on TC  HEENT: normal + cor-trak Neck: supple. LIJ tunneled cath. Carotids 2+ bilat; no bruits. No lymphadenopathy or thryomegaly appreciated. Cor: Wounds ok. PMI nondisplaced. Regular rate & rhythm. No rubs, gallops or murmurs. Lungs: coarse Abdomen: soft, nontender, nondistended. No hepatosplenomegaly. No bruits or masses. Good bowel sounds. Extremities: no cyanosis, clubbing, rash, edema Neuro: alert & orientedx3, cranial nerves grossly intact. moves all 4 extremities w/o difficulty. Affect pleasant  Remains pressor dependent. Weaning NE as able. Hopefully can wean off but needed higher does on iHD yesterday. No HD today. Now has tunneled cath. Tolerating TC well. Continue PT/OT. Will likely need LTAC. Continue TFs.   CRITICAL CARE Performed by: Glori Bickers  Total critical care time: 35 minutes  Critical care time was exclusive of separately billable procedures and treating other patients.  Critical care was necessary to treat or prevent imminent or life-threatening deterioration.  Critical care was time spent personally by me (independent  of midlevel providers or residents) on the following activities: development of treatment plan with patient and/or surrogate as well as nursing, discussions with consultants, evaluation of patient's response to treatment, examination of patient, obtaining  history from patient or surrogate, ordering and performing treatments and interventions, ordering and review of laboratory studies, ordering and review of radiographic studies, pulse oximetry and re-evaluation of patient's condition.  Glori Bickers, MD  12:12 PM

## 2019-05-20 NOTE — Progress Notes (Signed)
Pacer wires removed as ordered. Pt tolerated well. No s/s of distress at this time.

## 2019-05-21 ENCOUNTER — Inpatient Hospital Stay (HOSPITAL_COMMUNITY): Payer: Medicaid Other

## 2019-05-21 LAB — RENAL FUNCTION PANEL
Albumin: 2.2 g/dL — ABNORMAL LOW (ref 3.5–5.0)
Anion gap: 16 — ABNORMAL HIGH (ref 5–15)
BUN: 100 mg/dL — ABNORMAL HIGH (ref 8–23)
CO2: 26 mmol/L (ref 22–32)
Calcium: 8.7 mg/dL — ABNORMAL LOW (ref 8.9–10.3)
Chloride: 88 mmol/L — ABNORMAL LOW (ref 98–111)
Creatinine, Ser: 3.54 mg/dL — ABNORMAL HIGH (ref 0.61–1.24)
GFR calc Af Amer: 20 mL/min — ABNORMAL LOW (ref >60)
GFR calc non Af Amer: 18 mL/min — ABNORMAL LOW (ref >60)
Glucose, Bld: 227 mg/dL — ABNORMAL HIGH (ref 70–99)
Phosphorus: 7.4 mg/dL — ABNORMAL HIGH (ref 2.5–4.6)
Potassium: 5.4 mmol/L — ABNORMAL HIGH (ref 3.5–5.1)
Sodium: 130 mmol/L — ABNORMAL LOW (ref 135–145)

## 2019-05-21 LAB — MAGNESIUM: Magnesium: 2.9 mg/dL — ABNORMAL HIGH (ref 1.7–2.4)

## 2019-05-21 LAB — CBC
HCT: 29.1 % — ABNORMAL LOW (ref 39.0–52.0)
Hemoglobin: 9 g/dL — ABNORMAL LOW (ref 13.0–17.0)
MCH: 30.6 pg (ref 26.0–34.0)
MCHC: 30.9 g/dL (ref 30.0–36.0)
MCV: 99 fL (ref 80.0–100.0)
Platelets: 142 10*3/uL — ABNORMAL LOW (ref 150–400)
RBC: 2.94 MIL/uL — ABNORMAL LOW (ref 4.22–5.81)
RDW: 17.5 % — ABNORMAL HIGH (ref 11.5–15.5)
WBC: 9.6 10*3/uL (ref 4.0–10.5)
nRBC: 0 % (ref 0.0–0.2)

## 2019-05-21 LAB — COOXEMETRY PANEL
Carboxyhemoglobin: 2.1 % — ABNORMAL HIGH (ref 0.5–1.5)
Methemoglobin: 0.7 % (ref 0.0–1.5)
O2 Saturation: 57.6 %
Total hemoglobin: 8.7 g/dL — ABNORMAL LOW (ref 12.0–16.0)

## 2019-05-21 LAB — GLUCOSE, CAPILLARY
Glucose-Capillary: 215 mg/dL — ABNORMAL HIGH (ref 70–99)
Glucose-Capillary: 209 mg/dL — ABNORMAL HIGH (ref 70–99)
Glucose-Capillary: 242 mg/dL — ABNORMAL HIGH (ref 70–99)
Glucose-Capillary: 112 mg/dL — ABNORMAL HIGH (ref 70–99)
Glucose-Capillary: 53 mg/dL — ABNORMAL LOW (ref 70–99)
Glucose-Capillary: 177 mg/dL — ABNORMAL HIGH (ref 70–99)

## 2019-05-21 MED ORDER — NOREPINEPHRINE 16 MG/250ML-% IV SOLN
0.0000 ug/min | INTRAVENOUS | Status: DC
  Administered 2019-05-21: 2 ug/min via INTRAVENOUS
  Administered 2019-05-24: 8 ug/min via INTRAVENOUS
  Administered 2019-05-25: 10 ug/min via INTRAVENOUS
  Administered 2019-05-27 – 2019-05-30 (×2): 8 ug/min via INTRAVENOUS
  Administered 2019-05-31: 5 ug/min via INTRAVENOUS
  Administered 2019-06-02: 6 ug/min via INTRAVENOUS
  Administered 2019-06-05: 2 ug/min via INTRAVENOUS
  Administered 2019-06-09: 4 ug/min via INTRAVENOUS
  Administered 2019-06-12: 5 ug/min via INTRAVENOUS
  Administered 2019-06-13: 7 ug/min via INTRAVENOUS
  Administered 2019-06-15 – 2019-06-17 (×2): 5 ug/min via INTRAVENOUS
  Administered 2019-06-20: 3 ug/min via INTRAVENOUS
  Filled 2019-05-21 (×16): qty 250

## 2019-05-21 MED ORDER — HEPARIN SODIUM (PORCINE) 1000 UNIT/ML IJ SOLN
INTRAMUSCULAR | Status: AC
  Administered 2019-05-21: 3800 [IU]
  Filled 2019-05-21: qty 4

## 2019-05-21 MED ORDER — HEPARIN SODIUM (PORCINE) 1000 UNIT/ML DIALYSIS: 20.0000 [IU]/kg | INTRAMUSCULAR | Status: DC | PRN

## 2019-05-21 MED ORDER — CITALOPRAM HYDROBROMIDE 10 MG/5ML PO SOLN
10.0000 mg | Freq: Every day | ORAL | Status: DC
  Administered 2019-05-21 – 2019-06-20 (×31): 10 mg
  Filled 2019-05-21 (×33): qty 10

## 2019-05-21 MED ORDER — ALBUMIN HUMAN 25 % IV SOLN: 25.0000 g | Freq: Once | INTRAVENOUS | Status: AC

## 2019-05-21 MED ORDER — INSULIN ASPART 100 UNIT/ML ~~LOC~~ SOLN: SUBCUTANEOUS | 11 refills | Status: AC

## 2019-05-21 MED ORDER — DEXTROSE 50 % IV SOLN
INTRAVENOUS | Status: AC
  Filled 2019-05-21: qty 50

## 2019-05-21 MED ORDER — DEXTROSE 50 % IV SOLN
25.0000 g | INTRAVENOUS | Status: AC
  Administered 2019-05-21: 25 g via INTRAVENOUS

## 2019-05-21 MED ORDER — CLONAZEPAM 0.5 MG PO TBDP
0.5000 mg | ORAL_TABLET | Freq: Every day | ORAL | Status: DC
  Administered 2019-05-21 – 2019-05-22 (×2): 0.5 mg via ORAL
  Filled 2019-05-21 (×2): qty 1

## 2019-05-21 MED ORDER — INSULIN ASPART 100 UNIT/ML ~~LOC~~ SOLN
4.0000 [IU] | SUBCUTANEOUS | Status: DC
  Administered 2019-05-21 – 2019-05-23 (×8): 4 [IU] via SUBCUTANEOUS

## 2019-05-21 MED ORDER — SEVELAMER CARBONATE 0.8 G PO PACK: 0.8000 g | PACK | Freq: Three times a day (TID) | ORAL | Status: AC

## 2019-05-21 MED ORDER — FENTANYL CITRATE (PF) 100 MCG/2ML IJ SOLN: 50.0000 ug | INTRAMUSCULAR | Status: DC | PRN

## 2019-05-21 MED ORDER — ALBUMIN HUMAN 25 % IV SOLN
INTRAVENOUS | Status: AC
  Administered 2019-05-21: 25 g via INTRAVENOUS
  Filled 2019-05-21: qty 100

## 2019-05-21 NOTE — Progress Notes (Signed)
  Speech Language Pathology Treatment: Dysphagia  Patient Details Name: Brendan [name] MRN: [id number] DOB: October 30, 1955 Today's Date: 05/21/2019 Time: [id number] SLP Time Calculation (min) (ACUTE ONLY): 10 min  Assessment / Plan / Recommendation Clinical Impression  Pt requested that PMSV use be deferred today due to fatigue. He was educated regarding the purpose and demonstration of dysphagia exercises and he verbalized understanding as well as agreement. He completed lingual retraction and effortful swallows with verbal and visual prompts. Pt demonstrated a single period of dyspnea with O2 WNL but recovered quickly thereafter. The session was ultimately terminated per the pt's request due to fatigue. SLP will continue to follow pt.    HPI HPI: 49 yoM originally presented with SOB and fatigue at OHS found to have new HFrEF, NSTEMI, and bilateral pleural effusions transferred to [facility] on 3/29 for further cardiac evaluation. 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 Started on lasix and milrinone gtts however developed NSVT. Was taken 4/1 for placement of IABP and swan for optimization prior to CABG. Was on precedex for agitation/ confusion, some concern for DTs. On the evening of 4/1, patient developed worsening respiratory distress and hypoxia, PCCM consulted for intubation and vent management. He was successfully extubated on 4/4 and PCCM signed off. Impella was removed 4/11 and patient noted to have increased WOB throughout the day he was placed on Bipap, but mental status continued to worsen requiring reintubation. Found pulseless 4/14 VT requiring 1 minute of CPR and epinephrine to achieve ROSC.  Eventual trach placement 4/16.       SLP Plan  Continue with current plan of care       Recommendations  Diet recommendations: NPO Medication Administration: Via alternative means                Oral Care Recommendations: Oral care QID Follow up Recommendations: LTACH SLP Visit Diagnosis:  Dysphagia, oropharyngeal phase (R13.12) Plan: Continue with current plan of care       Derrian Poli I. Hardin Negus, [place], [place] Office number [phone] Pager [place] 05/21/2019, 10:05 AM

## 2019-05-21 NOTE — Progress Notes (Signed)
TCTS Evening Round  Stable day Resting comfortably with family at bedside Brendan [name], Brendan [name]

## 2019-05-21 NOTE — Social Work (Signed)
Pt currently uninsured, therefore from my understanding will not be LTAC eligible. Pt likely to need SNF placement under LOG contract from hospital. PASRR is [id number] [id number]. Pt will need to be 20mm cuffless trach for SNF placement. This may be a difficult placement when pt medically stable.   Westley Hummer, MSW, [place] Work

## 2019-05-21 NOTE — Procedures (Signed)
Tracheostomy Change Note  Patient Details:   Name: Brendan [name] DOB: 02/15/1955 MRN: [id number]    Airway Documentation:     Evaluation  O2 sats: stable throughout Complications: No apparent complications Patient did tolerate procedure well. Bilateral Breath Sounds: Rhonchi   Trach was changed to a #6 cuffless Shiley with 2 RT's without any complications. Lurline Idol was secured with trach ties, positive ETCO2.     Tymar Polyak, [place] 05/21/2019, 3:55 PM

## 2019-05-21 NOTE — Progress Notes (Signed)
Patient ID: NUMA SCHROETER, male   DOB: [birth year], 64 y.o.   MRN: [id number] S: Off of pressors, no events overnight. O:BP (!) 88/47   Pulse 60   Temp 97.6 F (36.4 C) (Oral)   Resp 11   Ht _0  (1.803 m) Comment: measured x 3  Wt 57.9 kg   SpO2 94%   BMI 17.80 kg/m   Intake/Output Summary (Last 24 hours) at 05/21/2019 0855 Last data filed at 05/21/2019 0000 Gross per 24 hour  Intake 980.49 ml  Output --  Net 980.49 ml   Intake/Output: I/O last 3 completed shifts: In: 1618.3 [I.V.:78.3; NG/GT:1540] Out: -   Intake/Output this shift:  No intake/output data recorded. Weight change: 0.6 kg Gen: cachectic WM lying in bed in NAD on trach collar CVS: no rub Resp: cta Abd: +BS, soft, NT/nd Ext: no edema Dialysis access:  LI[facility] TDC placed 05/19/19 Recent Labs  Lab 05/15/19 0302 05/15/19 0443 05/16/19 0356 05/16/19 0356 05/17/19 0329 05/17/19 0329 05/17/19 0721 05/17/19 1320 05/18/19 0336 05/19/19 0340 05/19/19 1130 05/20/19 0322 05/21/19 0359  NA 135   < > 134*   < > 134*   < > 134* 136 135 132* 134* 131* 130*  K 5.0   < > 4.4   < > 6.2*   < > 5.9* 4.2 4.6 6.4* 3.9 4.7 5.4*  CL 101   < > 101   < > 98   < > 95* 98 95* 90* 95* 92* 88*  CO2 27   < > 24   < > 23  --  _1 --  26 26  GLUCOSE 226*   < > 153*   < > 160*   < > 159* 199* 161* 250* 150* 177* 227*  BUN 34*   < > 69*   < > 119*   < > 127* 41* 75* 117* 35* 61* 100*  CREATININE 0.91   < > 1.88*   < > 3.36*   < > 3.49* 1.72* 2.64* 3.77* 1.40* 2.57* 3.54*  ALBUMIN 2.2*  2.2*  --  2.0*  --  1.9*  --   --   --  2.1* 2.1*  --  2.4* 2.2*  CALCIUM 8.4*   < > 8.4*   < > 8.5*  --  8.7* 8.1* 8.1* 8.2*  --  8.4* 8.7*  PHOS 3.4  --  4.8*  --  8.2*  --   --   --  8.7* >30.0*  --  6.7* 7.4*  AST 44*  --   --   --   --   --   --   --   --   --   --   --   --   ALT 56*  --   --   --   --   --   --   --   --   --   --   --   --    < > = values in this interval not displayed.   Liver Function Tests: Recent Labs  Lab  05/15/19 0302 05/16/19 0356 05/19/19 0340 05/20/19 0322 05/21/19 0359  AST 44*  --   --   --   --   ALT 56*  --   --   --   --   ALKPHOS 469*  --   --   --   --   BILITOT 0.8  --   --   --   --  PROT 7.0  --   --   --   --   ALBUMIN 2.2*  2.2*   < > 2.1* 2.4* 2.2*   < > = values in this interval not displayed.   No results for input(s): LIPASE, AMYLASE in the last 168 hours. No results for input(s): AMMONIA in the last 168 hours. CBC: Recent Labs  Lab 05/17/19 0721 05/17/19 0721 05/18/19 0336 05/18/19 0336 05/19/19 0340 05/19/19 0340 05/19/19 1130 05/20/19 0322 05/21/19 0359  WBC 18.4*   < > 11.5*   < > 14.1*  --   --  9.6 9.6  HGB 10.5*   < > 9.6*   < > 9.7*   < > 10.5* 9.3* 9.0*  HCT 34.3*   < > 31.5*   < > 31.7*   < > 31.0* 30.0* 29.1*  MCV 98.0  --  98.7  --  100.3*  --   --  100.3* 99.0  PLT 219   < > 151   < > 138*  --   --  125* 142*   < > = values in this interval not displayed.   Cardiac Enzymes: No results for input(s): CKTOTAL, CKMB, CKMBINDEX, TROPONINI in the last 168 hours. CBG: Recent Labs  Lab 05/20/19 1535 05/20/19 2017 05/20/19 2311 05/21/19 0442 05/21/19 0814  GLUCAP 136* 116* 154* 215* 209*    Iron Studies: No results for input(s): IRON, TIBC, TRANSFERRIN, FERRITIN in the last 72 hours. Studies/Results: IR Fluoro Guide CV Line Left  Result Date: 05/20/2019 CLINICAL DATA:  Renal failure, needs durable venous access for planned hemodialysis EXAM: TUNNELED HEMODIALYSIS CATHETER PLACEMENT WITH ULTRASOUND AND FLUOROSCOPIC GUIDANCE TECHNIQUE: The procedure, risks, benefits, and alternatives were explained to the family. Questions regarding the procedure were encouraged and answered. They understands and consents to the procedure. Patency of the left IJ vein was confirmed with ultrasound with image documentation. An appropriate skin site was determined. Region was prepped using maximum barrier technique including cap and mask, sterile gown, sterile  gloves, large sterile sheet, and Chlorhexidine as cutaneous antisepsis. The region was infiltrated locally with 1% lidocaine. Intravenous Fentanyl 22mg and Versed 0.521mwere administered as conscious sedation during continuous monitoring of the patient's level of consciousness and physiological / cardiorespiratory status by the radiology RN, with a total moderate sedation time of 13 minutes. Under real-time ultrasound guidance, the left IJ vein was accessed with a 21 gauge micropuncture needle; the needle tip within the vein was confirmed with ultrasound image documentation. Needle exchanged over the 018 guidewire for transitional dilator, which allowed advancement of a Benson wire into the IVC. Over this, an MPA catheter was advanced. A Palindrome 23 hemodialysis catheter was tunneled from the left anterior chest wall approach to the left IJ dermatotomy site. The MPA catheter was exchanged over an Amplatz wire for serial vascular dilators which allow placement of a peel-away sheath, through which the catheter was advanced under intermittent fluoroscopy, positioned with its tips in the proximal and midright atrium. Spot chest radiograph confirms good catheter position. No pneumothorax. Catheter was flushed and primed per protocol. Catheter secured externally with O Prolene sutures. The left IJ dermatotomy site was closed with Dermabond. COMPLICATIONS: COMPLICATIONS None immediate FLUOROSCOPY TIME:  0.3 minute; 17 uGym2 DAP COMPARISON:  None IMPRESSION: 1. Technically successful placement of tunneled left IJ hemodialysis catheter with ultrasound and fluoroscopic guidance. Ready for routine use. ACCESS: Remains approachable for percutaneous intervention as needed. Electronically Signed   By: D Eden Emms.  On: 05/20/2019 16:19   IR US Guide Vasc Access Left  Result Date: 05/20/2019 CLINICAL DATA:  Renal failure, needs durable venous access for planned hemodialysis EXAM: TUNNELED HEMODIALYSIS CATHETER PLACEMENT  WITH ULTRASOUND AND FLUOROSCOPIC GUIDANCE TECHNIQUE: The procedure, risks, benefits, and alternatives were explained to the family. Questions regarding the procedure were encouraged and answered. They understands and consents to the procedure. Patency of the left IJ vein was confirmed with ultrasound with image documentation. An appropriate skin site was determined. Region was prepped using maximum barrier technique including cap and mask, sterile gown, sterile gloves, large sterile sheet, and Chlorhexidine as cutaneous antisepsis. The region was infiltrated locally with 1% lidocaine. Intravenous Fentanyl 45mg and Versed 0.53mwere administered as conscious sedation during continuous monitoring of the patient's level of consciousness and physiological / cardiorespiratory status by the radiology RN, with a total moderate sedation time of 13 minutes. Under real-time ultrasound guidance, the left IJ vein was accessed with a 21 gauge micropuncture needle; the needle tip within the vein was confirmed with ultrasound image documentation. Needle exchanged over the 018 guidewire for transitional dilator, which allowed advancement of a Benson wire into the IVC. Over this, an MPA catheter was advanced. A Palindrome 23 hemodialysis catheter was tunneled from the left anterior chest wall approach to the left IJ dermatotomy site. The MPA catheter was exchanged over an Amplatz wire for serial vascular dilators which allow placement of a peel-away sheath, through which the catheter was advanced under intermittent fluoroscopy, positioned with its tips in the proximal and midright atrium. Spot chest radiograph confirms good catheter position. No pneumothorax. Catheter was flushed and primed per protocol. Catheter secured externally with O Prolene sutures. The left IJ dermatotomy site was closed with Dermabond. COMPLICATIONS: COMPLICATIONS None immediate FLUOROSCOPY TIME:  0.3 minute; 17 uGym2 DAP COMPARISON:  None IMPRESSION: 1.  Technically successful placement of tunneled left IJ hemodialysis catheter with ultrasound and fluoroscopic guidance. Ready for routine use. ACCESS: Remains approachable for percutaneous intervention as needed. Electronically Signed   By: D Lucrezia Europe.D.   On: 05/20/2019 16:19   . aspirin EC  325 mg Oral Daily   Or  . aspirin  324 mg Per Tube Daily  . B-complex with vitamin C  1 tablet Per Tube Daily  . chlorhexidine gluconate (MEDLINE KIT)  15 mL Mouth Rinse BID  . Chlorhexidine Gluconate Cloth  6 each Topical Q0600  . docusate  200 mg Per Tube Daily  . feeding supplement (PRO-STAT SUGAR FREE 64)  30 mL Oral BID  . insulin aspart  2 Units Subcutaneous Q4H  . insulin glargine  16 Units Subcutaneous BID  . mouth rinse  15 mL Mouth Rinse 10 times per day  . metoCLOPramide (REGLAN) injection  5 mg Intravenous Q8H  . midodrine  15 mg Per Tube Q8H  . neomycin-polymyxin-hydrocortisone  3 drop Left EAR Q12H  . pantoprazole sodium  40 mg Per Tube BID  . sevelamer carbonate  0.8 g Per Tube TID WC  . sodium chloride flush  10-40 mL Intracatheter Q12H    BMET    Component Value Date/Time   NA 130 (L) 05/21/2019 0359   K 5.4 (H) 05/21/2019 0359   CL 88 (L) 05/21/2019 0359   CO2 26 05/21/2019 0359   GLUCOSE 227 (H) 05/21/2019 0359   BUN 100 (H) 05/21/2019 0359   CREATININE 3.54 (H) 05/21/2019 0359   CALCIUM 8.7 (L) 05/21/2019 0359   GFRNONAA 18 (L) 05/21/2019 0359   GFRAA 20 (  L) 05/21/2019 0359   CBC    Component Value Date/Time   WBC 9.6 05/21/2019 0359   RBC 2.94 (L) 05/21/2019 0359   HGB 9.0 (L) 05/21/2019 0359   HCT 29.1 (L) 05/21/2019 0359   PLT 142 (L) 05/21/2019 0359   MCV 99.0 05/21/2019 0359   MCH 30.6 05/21/2019 0359   MCHC 30.9 05/21/2019 0359   RDW 17.5 (H) 05/21/2019 0359   LYMPHSABS 1.1 05/16/2019 0402   MONOABS 1.1 (H) 04/21/2019 0402   EOSABS 0.2 04/19/2019 0402   BASOSABS 0.0 05/14/2019 0402     Brief HPI: admitted to outside hospital on 3/26 with SOB, new  low EF and [facility]STEMI 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 Admission Scr was 0.99.  Assessment/Plan:  1. Oliguric/anuricAKI- CRRT started on 4/10 and taken off 4/29. Transitioned to IHD on 05/17/19 and today is on his second session but with low bp and requiring increase in levophed. 1. Remains oliguric/anuric. 2. Wean levophed as able 3. Plan for HD today and hopefully he can tolerate it without resuming pressors 2. Vascular access- left IJ tunneled HD catheter placed by IR on 05/19/19 3. CAD- 3 vessel with EF 25% s/p CABG and MVR 09/20/60 complicated by cardiogenic shock 4. Acute systolic CHF and cardiogenic shock- still on pressors and inotrope dependent. Volume improved with CRRT/IHD 5. Acute hypoxic respiratory failure- s/p trach 6. DM- per primary 7. MR s/p MVR 8. Polymorphic VT- off amio 9. Severe debility- will need LTAC but needs to remain off of pressors.  Donetta Potts, MD [employer] [phone]

## 2019-05-21 NOTE — Progress Notes (Signed)
PT Cancellation Note  Patient Details Name: Brendan [name] MRN: [id number] DOB: June 20, 1955   Cancelled Treatment:    Reason Eval/Treat Not Completed: Patient declined, no reason specified; patient on HD and RN approved in bed exercises, but pt refused despite encouragement.  RN reports pt refused OOB x 3 attempts today as well prior to HD.  PT to follow up.    [name] 05/21/2019, 4:17 PM  Magda Kiel, [place] [phone] 05/21/2019

## 2019-05-21 NOTE — Progress Notes (Signed)
Inpatient Diabetes Program Recommendations  AACE/ADA: New Consensus Statement on Inpatient Glycemic Control (2015)  Target Ranges:  Prepandial:   less than 140 mg/dL      Peak postprandial:   less than 180 mg/dL (1-2 hours)      Critically ill patients:  140 - 180 mg/dL   Lab Results  Component Value Date   GLUCAP 242 (H) 05/21/2019   HGBA1C 9.0 (H) 03/20/2019    Review of Glycemic Control  Results for TALTON, DELPRIORE (MRN [id number]) as of 05/21/2019 12:59  Ref. Range 05/20/2019 20:17 05/20/2019 23:11 05/21/2019 04:42 05/21/2019 08:14 05/21/2019 11:46  Glucose-Capillary Latest Ref Range: 70 - 99 mg/dL 116 (H) 154 (H) 215 (H) 209 (H) 242 (H)   Diabetes history: DM 2 Outpatient Diabetes medications:  Metformin 500 mg bid, Humalog 8 units bid, Basaglar 28 unit q HS Current orders for Inpatient glycemic control:  Lantus 16 units bid, Novolog 2 units q 4 hours  Inpatient Diabetes Program Recommendations:    Please consider increasing Novolog tube feed coverage to 4 units q 4 hours (hold if patient eats less than 50%).   Thanks,  Adah Perl, RN, BC-ADM Inpatient Diabetes Coordinator Pager [phone] (8a-5p)

## 2019-05-21 NOTE — Progress Notes (Addendum)
TCTS DAILY ICU PROGRESS NOTE                   [address]            [place] [address]          [phone]   19 Days Post-Op Procedure(s) (LRB): VIDEO BRONCHOSCOPY USING DISPOSABLE ANESTHESIA SCOPE (N/A) TRACHEOSTOMY (N/A)  Total Length of Stay:  LOS: 37 days   Subjective: Patient sleeping and briefly awakened. When asked if he has abdominal discomfort he nodded yes and when I asked if it hurts in one area he said no just kind of all over.  Objective: Vital signs in last 24 hours: Temp:  [97.6 F (36.4 C)-97.8 F (36.6 C)] 97.6 F (36.4 C) (05/05 0400) Pulse Rate:  [62-73] 67 (05/05 0400) Cardiac Rhythm: Normal sinus rhythm (05/05 0000) Resp:  [10-23] 16 (05/05 0400) BP: (81-114)/(36-81) 107/59 (05/05 0400) SpO2:  [80 %-99 %] 95 % (05/05 0400) FiO2 (%):  [35 %] 35 % (05/05 0300) Weight:  [57.9 kg] 57.9 kg (05/05 0434)  Filed Weights   05/19/19 1045 05/20/19 0500 05/21/19 0434  Weight: 57.3 kg 57.6 kg 57.9 kg    Weight change: 0.6 kg      Intake/Output from previous day: 05/04 0701 - 05/05 0700 In: 1035.6 [I.V.:45.6; NG/GT:990] Out: -   Intake/Output this shift: No intake/output data recorded.  Current Meds: Scheduled Meds: . aspirin EC  325 mg Oral Daily   Or  . aspirin  324 mg Per Tube Daily  . B-complex with vitamin C  1 tablet Per Tube Daily  . chlorhexidine gluconate (MEDLINE KIT)  15 mL Mouth Rinse BID  . Chlorhexidine Gluconate Cloth  6 each Topical Q0600  . docusate  200 mg Per Tube Daily  . feeding supplement (PRO-STAT SUGAR FREE 64)  30 mL Oral BID  . insulin aspart  2 Units Subcutaneous Q4H  . insulin glargine  16 Units Subcutaneous BID  . mouth rinse  15 mL Mouth Rinse 10 times per day  . metoCLOPramide (REGLAN) injection  5 mg Intravenous Q8H  . midodrine  15 mg Per Tube Q8H  . neomycin-polymyxin-hydrocortisone  3 drop Left EAR Q12H  . pantoprazole sodium  40 mg Per Tube BID  . sevelamer carbonate  0.8 g Per Tube TID WC   . sodium chloride flush  10-40 mL Intracatheter Q12H   Continuous Infusions: . feeding supplement (NEPRO CARB STEADY) 1,000 mL (05/20/19 1635)  . heparin 10,000 units/ 20 mL infusion syringe 1,000 Units/hr (05/15/19 0624)  . lactated ringers Stopped (05/09/2019 1640)  . norepinephrine (LEVOPHED) Adult infusion 2 mcg/min (05/21/19 0000)   PRN Meds:.alum & mag hydroxide-simeth, diphenhydrAMINE, Gerhardt's butt cream, levalbuterol, ondansetron (ZOFRAN) IV, oxyCODONE, [START ON 05/26/2019] pneumococcal 23 valent vaccine, sodium chloride flush  Heart: RRR Lungs: Mostly clear Abdomen: Soft, did not grimace with light palpation,  bowel sounds present Extremities: No LE edema Wound: Clean and dry. Has eschar lower half of sternal wound  Lab Results: CBC: Recent Labs    05/20/19 0322 05/21/19 0359  WBC 9.6 9.6  HGB 9.3* 9.0*  HCT 30.0* 29.1*  PLT 125* 142*   BMET:  Recent Labs    05/20/19 0322 05/21/19 0359  NA 131* 130*  K 4.7 5.4*  CL 92* 88*  CO2 26 26  GLUCOSE 177* 227*  BUN 61* 100*  CREATININE 2.57* 3.54*  CALCIUM 8.4* 8.7*    CMET: Lab Results  Component Value Date  WBC 9.6 05/21/2019   HGB 9.0 (L) 05/21/2019   HCT 29.1 (L) 05/21/2019   PLT 142 (L) 05/21/2019   GLUCOSE 227 (H) 05/21/2019   CHOL 174 08/21/2017   TRIG 143 08/21/2017   HDL 57 08/21/2017   LDLCALC 88 08/21/2017   ALT 56 (H) 05/15/2019   AST 44 (H) 05/15/2019   NA 130 (L) 05/21/2019   K 5.4 (H) 05/21/2019   CL 88 (L) 05/21/2019   CREATININE 3.54 (H) 05/21/2019   BUN 100 (H) 05/21/2019   CO2 26 05/21/2019   TSH 0.925 04/03/2019   INR 1.6 (H) 05/01/2019   HGBA1C 9.0 (H) 03/21/2019   MICROALBUR 30 08/21/2017    PT/INR:  No results for input(s): LABPROT, INR in the last 72 hours. Radiology: No results found.  Assessment/Plan: S/P Procedure(s) (LRB): VIDEO BRONCHOSCOPY USING DISPOSABLE ANESTHESIA SCOPE (N/A) TRACHEOSTOMY (N/A)  1. CV-S/p removal of Impella on 04/09.  S/p V tach arrest  04/15. Previous a fib. SR with HR in the 70's this am.  On  Midodrine 15 mg tid;off Levophed. Receiving intermittent CVVHD but remains pressor dependent.   Co ox this am decreased to 57.6 (has been off Milrinone) 2. Pulmonary-S/p trach 04/16. .On trach collar most of the day and ventilator at night (FiO2% 35).  3. Expected post op blood loss anemia-H and H this am stable at 9 and 29.1 4. DM-CBGs 116/154/215. On Insulin. He is on Insulin and this has been increased for better glucose control over the last 2 days. He was on Metformin 1000 mg bid prior to surgery, but will continue on Insulin for now as NPO. Pre op HGA1C 9. He will need close medical follow up after discharge 5. AKI-Anuric. Creatinine increased to 3.54 this am and potassium 5.4. Nephrology following and arranging for CVVHD accordingly;he had CVVHD yesterday. 6. GI-severe malnutrition of chronic illness.  Cortrak, TFs. Speech pathology evaluation done yesterday and recommendations to be followed accordingly. Albumin remains 2.2 7. Elevated transaminases (likely related to shock)-Last  AST 44, ALT 56, ALk phos 469 and have been stable   9. Acute systolic heart failure-CVVHD helping with volume status 11. Extremely deconditioned-will need PT as able. 12. Likely to LTAC soon  Nani Skillern PA-C 05/21/2019 7:30 AM   HD today Cont on trach collar with O2 sat 93% consult placed for LTAC which I have d/w patients wife Use norepi to keep systolic 459 mm Hg  patient examined and medical record reviewed,agree with above note. [name] 05/21/2019

## 2019-05-21 NOTE — Progress Notes (Signed)
Patient ID: Brendan [name], male   DOB: Aug 21, 1955, 64 y.o.   MRN: [id number]     Advanced Heart Failure Rounding Note  PCP-Cardiologist: No primary care provider on file.   Subjective:    Resting in bed. Will awaken to voice and follow commands.  Remains on TC. NE off this am.  Co-ox 58%. CXR this am still with diffuse infiltrates. (Personally reviewed)   Plan for iHD today.   Objective:   Weight Range: 57.9 kg Body mass index is 17.8 kg/m.   Vital Signs:   Temp:  [97.6 F (36.4 C)-97.8 F (36.6 C)] 97.6 F (36.4 C) (05/05 0400) Pulse Rate:  [62-73] 62 (05/05 0700) Resp:  [10-23] 19 (05/05 0700) BP: (81-114)/(36-81) 97/55 (05/05 0700) SpO2:  [80 %-99 %] 91 % (05/05 0700) FiO2 (%):  [35 %] 35 % (05/05 0400) Weight:  [57.9 kg] 57.9 kg (05/05 0434) Last BM Date: 05/19/19  Weight change: Filed Weights   05/19/19 1045 05/20/19 0500 05/21/19 0434  Weight: 57.3 kg 57.6 kg 57.9 kg    Intake/Output:   Intake/Output Summary (Last 24 hours) at 05/21/2019 0755 Last data filed at 05/21/2019 0000 Gross per 24 hour  Intake 1035.62 ml  Output --  Net 1035.62 ml      Physical Exam   General: Sitting up in bed. Weak. Cachetic HEENT: normal Neck: supple. + trach no JVD. Carotids 2+ bilat; no bruits. No lymphadenopathy or thryomegaly appreciated. LIJ tunneled cath in place Cor: PMI nondisplaced. Regular rate & rhythm. No rubs, gallops or murmurs. Lungs: coarse Abdomen: soft, nontender, nondistended. No hepatosplenomegaly. No bruits or masses. Good bowel sounds. Extremities: no cyanosis, clubbing, rash, edema Neuro: alert & orientedx3, cranial nerves grossly intact. moves all 4 extremities w/o difficulty. Affect pleasant   Telemetry    NSR 60s Personally reviewed  Labs    CBC Recent Labs    05/20/19 0322 05/21/19 0359  WBC 9.6 9.6  HGB 9.3* 9.0*  HCT 30.0* 29.1*  MCV 100.3* 99.0  PLT 125* 500*   Basic Metabolic Panel Recent Labs    05/20/19 0322  05/21/19 0359  NA 131* 130*  K 4.7 5.4*  CL 92* 88*  CO2 26 26  GLUCOSE 177* 227*  BUN 61* 100*  CREATININE 2.57* 3.54*  CALCIUM 8.4* 8.7*  MG 2.4 2.9*  PHOS 6.7* 7.4*   Liver Function Tests Recent Labs    05/20/19 0322 05/21/19 0359  ALBUMIN 2.4* 2.2*   No results for input(s): LIPASE, AMYLASE in the last 72 hours. Cardiac Enzymes No results for input(s): CKTOTAL, CKMB, CKMBINDEX, TROPONINI in the last 72 hours.  BNP: BNP (last 3 results) Recent Labs    04/04/2019 0113  BNP 655.7*    ProBNP (last 3 results) No results for input(s): PROBNP in the last 8760 hours.   D-Dimer No results for input(s): DDIMER in the last 72 hours. Hemoglobin A1C No results for input(s): HGBA1C in the last 72 hours. Fasting Lipid Panel No results for input(s): CHOL, HDL, LDLCALC, TRIG, CHOLHDL, LDLDIRECT in the last 72 hours. Thyroid Function Tests No results for input(s): TSH, T4TOTAL, T3FREE, THYROIDAB in the last 72 hours.  Invalid input(s): FREET3  Other results:   Imaging    No results found.   Medications:     Scheduled Medications: . aspirin EC  325 mg Oral Daily   Or  . aspirin  324 mg Per Tube Daily  . B-complex with vitamin C  1 tablet Per Tube Daily  .  chlorhexidine gluconate (MEDLINE KIT)  15 mL Mouth Rinse BID  . Chlorhexidine Gluconate Cloth  6 each Topical Q0600  . docusate  200 mg Per Tube Daily  . feeding supplement (PRO-STAT SUGAR FREE 64)  30 mL Oral BID  . insulin aspart  2 Units Subcutaneous Q4H  . insulin glargine  16 Units Subcutaneous BID  . mouth rinse  15 mL Mouth Rinse 10 times per day  . metoCLOPramide (REGLAN) injection  5 mg Intravenous Q8H  . midodrine  15 mg Per Tube Q8H  . neomycin-polymyxin-hydrocortisone  3 drop Left EAR Q12H  . pantoprazole sodium  40 mg Per Tube BID  . sevelamer carbonate  0.8 g Per Tube TID WC  . sodium chloride flush  10-40 mL Intracatheter Q12H    Infusions: . feeding supplement (NEPRO CARB STEADY) 1,000  mL (05/20/19 1635)  . heparin 10,000 units/ 20 mL infusion syringe 1,000 Units/hr (05/15/19 0624)  . lactated ringers Stopped (05/15/2019 1640)    PRN Medications: alum & mag hydroxide-simeth, diphenhydrAMINE, Gerhardt's butt cream, levalbuterol, ondansetron (ZOFRAN) IV, oxyCODONE, [START ON 05/26/2019] pneumococcal 23 valent vaccine, sodium chloride flush     Assessment/Plan   1. Acute systolic HF ->  Cardiogenic Shock  - Due to iCM. EF 20-25% - Impella 5.5 placed on 4/2.   - s/p CABG/MVRepair on 4/6  - Impella out 4/9.  - Echo 4/10 with EF 25-30%, normal RV, no MR.  - Echo 4/22 EF 30-35% mildly reduced RV MVR ok    - Off NE this am. Co-ox 58%. Volume status managed by iHD. Will have iHD and hopefully can tolerate without need to restart NE.  - If can tolerate iHD without pressors likely ready for LTCA soon. Continue midodrine 15 tid  2. 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 Echo stable - No s/s angina. On ASA/Crestor  3. Acute hypoxic respiratory failure - Due to pulmonary edema and PNA - Extubated 4/9 re-intubated on 4/12. Failed re-extubation on 4/14. Now s/p tracheostomy - Completed antibiotic course.  - S/P Bronch 4/27 - 60K yeast. -> ? Colonization.  - Doing well with TC. Still with moderate secretion but no infectious symptoms. Otherwise. Pulmonary following.  - CXR still with diffuse infiltrates which have not cleared completely with volume removal. For HD today  4. Mitral Regurgitation - Mod-severe on ECHO - s/p MV repair, TEE 4/9 with minimal MR s/p repair.   - Echo 4/10 with no significant MR.  - echo 4/22 MVR stable  5. Uncontrolled DM - Hgb A1C 9.  - On insulin and sliding scale.  - No change  6. AKI - due to shock - Remains anuric post-op - Plan as above. For iHD today   7. Acute blood loss Anemia  - Hgb 9.0 - No obvious bleeding.  - transfuse hgb < 7.5   8. Polymorphic VT - Recurrent VT during respiratory distress  4/14, - Off amio  - No further VT.  - Keep K> 4.0 Mg > 2.0  - no change  9. Severe Malnutrition - Prealbumin improved, up from 8.9>>18.7 - Nutrition on board - Continue w/ TFs  10. Atrial fibrillation. paroxysmal -  Remains in NSR.  - Amiodarone was stopped.   36. Debility, severe - as above will likely need LTAC on d/c  12. Chronic pain issues - pain meds.being tapered  Glori Bickers, MD  05/21/2019 7:55 AM

## 2019-05-22 ENCOUNTER — Inpatient Hospital Stay (HOSPITAL_COMMUNITY): Payer: Medicaid Other

## 2019-05-22 LAB — POCT I-STAT 7, (LYTES, BLD GAS, ICA,H+H)
Acid-Base Excess: 3 mmol/L — ABNORMAL HIGH (ref 0.0–2.0)
Bicarbonate: 30.1 mmol/L — ABNORMAL HIGH (ref 20.0–28.0)
Calcium, Ion: 1.23 mmol/L (ref 1.15–1.40)
HCT: 29 % — ABNORMAL LOW (ref 39.0–52.0)
Hemoglobin: 9.9 g/dL — ABNORMAL LOW (ref 13.0–17.0)
O2 Saturation: 96 %
Potassium: 4.1 mmol/L (ref 3.5–5.1)
Sodium: 131 mmol/L — ABNORMAL LOW (ref 135–145)
TCO2: 32 mmol/L (ref 22–32)
pCO2 arterial: 55.8 mmHg — ABNORMAL HIGH (ref 32.0–48.0)
pH, Arterial: 7.34 — ABNORMAL LOW (ref 7.350–7.450)
pO2, Arterial: 86 mmHg (ref 83.0–108.0)

## 2019-05-22 LAB — RENAL FUNCTION PANEL
Albumin: 2.3 g/dL — ABNORMAL LOW (ref 3.5–5.0)
Anion gap: 12 (ref 5–15)
BUN: 50 mg/dL — ABNORMAL HIGH (ref 8–23)
CO2: 27 mmol/L (ref 22–32)
Calcium: 8.6 mg/dL — ABNORMAL LOW (ref 8.9–10.3)
Chloride: 93 mmol/L — ABNORMAL LOW (ref 98–111)
Creatinine, Ser: 2.29 mg/dL — ABNORMAL HIGH (ref 0.61–1.24)
GFR calc Af Amer: 34 mL/min — ABNORMAL LOW (ref >60)
GFR calc non Af Amer: 30 mL/min — ABNORMAL LOW (ref >60)
Glucose, Bld: 134 mg/dL — ABNORMAL HIGH (ref 70–99)
Phosphorus: 5.1 mg/dL — ABNORMAL HIGH (ref 2.5–4.6)
Potassium: 4.2 mmol/L (ref 3.5–5.1)
Sodium: 132 mmol/L — ABNORMAL LOW (ref 135–145)

## 2019-05-22 LAB — CBC
HCT: 28.3 % — ABNORMAL LOW (ref 39.0–52.0)
Hemoglobin: 8.6 g/dL — ABNORMAL LOW (ref 13.0–17.0)
MCH: 30.6 pg (ref 26.0–34.0)
MCHC: 30.4 g/dL (ref 30.0–36.0)
MCV: 100.7 fL — ABNORMAL HIGH (ref 80.0–100.0)
Platelets: 144 10*3/uL — ABNORMAL LOW (ref 150–400)
RBC: 2.81 MIL/uL — ABNORMAL LOW (ref 4.22–5.81)
RDW: 17.3 % — ABNORMAL HIGH (ref 11.5–15.5)
WBC: 10 10*3/uL (ref 4.0–10.5)
nRBC: 0 % (ref 0.0–0.2)

## 2019-05-22 LAB — GLUCOSE, CAPILLARY
Glucose-Capillary: 127 mg/dL — ABNORMAL HIGH (ref 70–99)
Glucose-Capillary: 129 mg/dL — ABNORMAL HIGH (ref 70–99)
Glucose-Capillary: 144 mg/dL — ABNORMAL HIGH (ref 70–99)
Glucose-Capillary: 144 mg/dL — ABNORMAL HIGH (ref 70–99)
Glucose-Capillary: 147 mg/dL — ABNORMAL HIGH (ref 70–99)
Glucose-Capillary: 163 mg/dL — ABNORMAL HIGH (ref 70–99)
Glucose-Capillary: 88 mg/dL (ref 70–99)

## 2019-05-22 LAB — COOXEMETRY PANEL
Carboxyhemoglobin: 1.9 % — ABNORMAL HIGH (ref 0.5–1.5)
Methemoglobin: 0.7 % (ref 0.0–1.5)
O2 Saturation: 64.4 %
Total hemoglobin: 9.4 g/dL — ABNORMAL LOW (ref 12.0–16.0)

## 2019-05-22 LAB — MAGNESIUM: Magnesium: 2.3 mg/dL (ref 1.7–2.4)

## 2019-05-22 MED ORDER — INSULIN GLARGINE 100 UNIT/ML ~~LOC~~ SOLN
14.0000 [IU] | Freq: Two times a day (BID) | SUBCUTANEOUS | Status: DC
  Administered 2019-05-22 – 2019-05-24 (×6): 14 [IU] via SUBCUTANEOUS
  Filled 2019-05-22 (×9): qty 0.14

## 2019-05-22 MED ORDER — SODIUM CHLORIDE 0.9 % IV SOLN
1.0000 g | Freq: Two times a day (BID) | INTRAVENOUS | Status: DC
  Administered 2019-05-22 – 2019-05-23 (×3): 1 g via INTRAVENOUS
  Filled 2019-05-22 (×5): qty 1

## 2019-05-22 NOTE — Progress Notes (Signed)
Hypoglycemic Event  CBG: 53  Treatment: D50 25g x1  Symptoms: None  Follow-up CBG: Time:2037 CBG Result:177   Possible Reasons for Event: Insulin dosage needs adjusted  Comments/MD notified: n/a    Brendan [name]

## 2019-05-22 NOTE — TOC Progression Note (Signed)
Transition of Care [facility]) - Progression Note    Patient Details  Name: Brendan [name] MRN: [id number] Date of Birth: January 23, 1955  [facility] CM/SW [place], [place] Phone Number: 05/22/2019, 4:29 PM  Clinical Narrative:     CSW is following patient. CSW looking into patient being a [facility] SNF.   CSW will continue to follow.  Expected Discharge Plan: Skilled Nursing Facility Barriers to Discharge: SNF Pending Medicaid, Continued Medical Work up, SNF Pending payor source - LOG  Expected Discharge Plan and Services Expected Discharge Plan: [place]   Discharge Planning Services: CM Consult Post Acute Care Choice: [place]                                         Social Determinants of Health (SDOH) Interventions    Readmission Risk Interventions No flowsheet data found.

## 2019-05-22 NOTE — Progress Notes (Signed)
Chaplain offered prayer over Brendan [name] with his consent.    Chaplain will follow-up.

## 2019-05-22 NOTE — Progress Notes (Addendum)
Patient ID: GAELAN GLENNON, male   DOB: 04-22-1955, 64 y.o.   MRN: [id number]     Advanced Heart Failure Rounding Note  PCP-Cardiologist: No primary care provider on file.   Subjective:    Continues on TC and iHD.   NE restarted overnight now on 3 mcg. MAPs in the upper 60s. Co-ox 64%   In chair working w/ PT.   No cardiac complaints.   Objective:   Weight Range: 55.4 kg Body mass index is 17.03 kg/m.   Vital Signs:   Temp:  [96.8 F (36 C)-97.9 F (36.6 C)] 97.7 F (36.5 C) (05/06 0300) Pulse Rate:  [58-77] 70 (05/06 0825) Resp:  [10-26] 16 (05/06 0825) BP: (83-145)/(45-113) 100/56 (05/06 0645) SpO2:  [86 %-98 %] 94 % (05/06 0645) FiO2 (%):  [35 %-80 %] 80 % (05/06 0825) Weight:  [55.4 kg-57.9 kg] 55.4 kg (05/06 0500) Last BM Date: 05/21/19  Weight change: Filed Weights   05/21/19 0434 05/21/19 1300 05/22/19 0500  Weight: 57.9 kg 57.9 kg 55.4 kg    Intake/Output:   Intake/Output Summary (Last 24 hours) at 05/22/2019 0855 Last data filed at 05/22/2019 0800 Gross per 24 hour  Intake 741.85 ml  Output 2000 ml  Net -1258.15 ml      Physical Exam   General: frail/ cachetic WM, witting up in chair  HEENT: normal Neck: supple.+ TC. no JVD. Carotids 2+ bilat; no bruits. No lymphadenopathy or thryomegaly appreciated. LIJ tunneled cath in place Cor: PMI nondisplaced. Regular rate & rhythm. No rubs, gallops or murmurs. Lungs: coarse, no wheezing  Abdomen: soft, nontender, nondistended. No hepatosplenomegaly. No bruits or masses. Good bowel sounds. Extremities: no cyanosis, clubbing, rash, thin extremities, no edema Neuro: alert & orientedx3, cranial nerves grossly intact. moves all 4 extremities w/o difficulty. Affect pleasant   Telemetry    NSR 60s Personally reviewed  Labs    CBC Recent Labs    05/21/19 0359 05/21/19 0359 05/22/19 0402 05/22/19 0827  WBC 9.6  --  10.0  --   HGB 9.0*   < > 8.6* 9.9*  HCT 29.1*   < > 28.3* 29.0*  MCV 99.0  --   100.7*  --   PLT 142*  --  144*  --    < > = values in this interval not displayed.   Basic Metabolic Panel Recent Labs    05/21/19 0359 05/21/19 0359 05/22/19 0402 05/22/19 0827  NA 130*   < > 132* 131*  K 5.4*   < > 4.2 4.1  CL 88*  --  93*  --   CO2 26  --  27  --   GLUCOSE 227*  --  134*  --   BUN 100*  --  50*  --   CREATININE 3.54*  --  2.29*  --   CALCIUM 8.7*  --  8.6*  --   MG 2.9*  --  2.3  --   PHOS 7.4*  --  5.1*  --    < > = values in this interval not displayed.   Liver Function Tests Recent Labs    05/21/19 0359 05/22/19 0402  ALBUMIN 2.2* 2.3*   No results for input(s): LIPASE, AMYLASE in the last 72 hours. Cardiac Enzymes No results for input(s): CKTOTAL, CKMB, CKMBINDEX, TROPONINI in the last 72 hours.  BNP: BNP (last 3 results) Recent Labs    03/21/2019 0113  BNP 655.7*    ProBNP (last 3 results) No results for input(s): PROBNP  in the last 8760 hours.   D-Dimer No results for input(s): DDIMER in the last 72 hours. Hemoglobin A1C No results for input(s): HGBA1C in the last 72 hours. Fasting Lipid Panel No results for input(s): CHOL, HDL, LDLCALC, TRIG, CHOLHDL, LDLDIRECT in the last 72 hours. Thyroid Function Tests No results for input(s): TSH, T4TOTAL, T3FREE, THYROIDAB in the last 72 hours.  Invalid input(s): FREET3  Other results:   Imaging    DG Chest Port 1 View  Result Date: 05/22/2019 CLINICAL DATA:  Tracheostomy, respiratory failure, MVR, diabetes mellitus, hypertension, history NSTEMI EXAM: PORTABLE CHEST 1 VIEW COMPARISON:  Portable exam 0552 hours compared to 05/21/2019 FINDINGS: Tracheostomy tube projects over tracheal air column. Feeding tube extends into stomach. RIGHT arm PICC line tip projects over RIGHT atrium. LEFT jugular dual-lumen central venous catheter with tip projecting over RIGHT atrium. Normal heart size post MVR and CABG. Diffuse BILATERAL pulmonary infiltrates favor pulmonary edema though multifocal infection  is not excluded with this appearance. No pleural effusion or pneumothorax. IMPRESSION: Severe diffuse BILATERAL airspace infiltrates favor pulmonary edema, slightly increased from prior study. Electronically Signed   By: Lavonia Dana M.D.   On: 05/22/2019 08:08     Medications:     Scheduled Medications: . aspirin EC  325 mg Oral Daily   Or  . aspirin  324 mg Per Tube Daily  . B-complex with vitamin C  1 tablet Per Tube Daily  . chlorhexidine gluconate (MEDLINE KIT)  15 mL Mouth Rinse BID  . Chlorhexidine Gluconate Cloth  6 each Topical Q0600  . citalopram  10 mg Per Tube Daily  . clonazePAM  0.5 mg Oral QHS  . docusate  200 mg Per Tube Daily  . feeding supplement (PRO-STAT SUGAR FREE 64)  30 mL Oral BID  . insulin aspart  4 Units Subcutaneous Q4H  . insulin glargine  14 Units Subcutaneous BID  . mouth rinse  15 mL Mouth Rinse 10 times per day  . metoCLOPramide (REGLAN) injection  5 mg Intravenous Q8H  . midodrine  15 mg Per Tube Q8H  . neomycin-polymyxin-hydrocortisone  3 drop Left EAR Q12H  . pantoprazole sodium  40 mg Per Tube BID  . sevelamer carbonate  0.8 g Per Tube TID WC  . sodium chloride flush  10-40 mL Intracatheter Q12H    Infusions: . feeding supplement (NEPRO CARB STEADY) 1,000 mL (05/21/19 1800)  . heparin 10,000 units/ 20 mL infusion syringe 1,000 Units/hr (05/15/19 0624)  . lactated ringers Stopped (04/19/2019 1640)  . norepinephrine (LEVOPHED) Adult infusion 2 mcg/min (05/22/19 0800)    PRN Medications: alum & mag hydroxide-simeth, diphenhydrAMINE, Gerhardt's butt cream, heparin, levalbuterol, ondansetron (ZOFRAN) IV, oxyCODONE, [START ON 05/26/2019] pneumococcal 23 valent vaccine, sodium chloride flush     Assessment/Plan   1. Acute systolic HF ->  Cardiogenic Shock  - Due to iCM. EF 20-25% - Impella 5.5 placed on 4/2.   - s/p CABG/MVRepair on 4/6  - Impella out 4/9.  - Echo 4/10 with EF 25-30%, normal RV, no MR.  - Echo 4/22 EF 30-35% mildly reduced  RV MVR ok   - Continues on low dose NE, 3 mcg. Co-ox 64%. Volume status managed by iHD.  - Will try to wean NE today  - If can tolerate iHD without pressors likely ready for LTCA soon. Continue midodrine. Consider increasing to maximum dose of 17.5 tid to help wean off NE  2. CAD - LHC with severe 3 vessel CAD  - s/p CABG  x 3 MV Repair 4/6. Echo stable - No s/s angina. On ASA/Crestor  3. Acute hypoxic respiratory failure - Due to pulmonary edema and PNA - Extubated 4/9 re-intubated on 4/12. Failed re-extubation on 4/14. Now s/p tracheostomy - Completed antibiotic course.  - S/P Bronch 4/27 - 60K yeast. -> ? Colonization.  - Doing well with TC. Still with moderate secretion but no infectious symptoms. Otherwise. Pulmonary following.  - CXR still with diffuse infiltrates which have not cleared completely with volume removal.  - Continue volume control through HD  4. Mitral Regurgitation - Mod-severe on ECHO - s/p MV repair, TEE 4/9 with minimal MR s/p repair.   - Echo 4/10 with no significant MR.  - echo 4/22 MVR stable  5. Uncontrolled DM - Hgb A1C 9.  - On insulin and sliding scale.  - No change  6. AKI - due to shock - Remains anuric post-op - Continue iHD per nephrology    7. Acute blood loss Anemia  - Hgb 9.9 - No obvious bleeding.  - transfuse hgb < 7.5   8. Polymorphic VT - Recurrent VT during respiratory distress 4/14, - Off amio  - No further VT.  - Keep K> 4.0 Mg > 2.0  - no change  9. Severe Malnutrition - Prealbumin improved, up from 8.9>>18.7 - Nutrition on board - Continue w/ TFs  10. Atrial fibrillation. paroxysmal -  Remains in NSR.  - Amiodarone was stopped.   94. Debility, severe - as above will likely need LTAC on d/c  12. Chronic pain issues - pain meds being tapered  Lyda Jester, PA-C  05/22/2019 8:55 AM    Agree with above  Back on NE at 3 overnight due to hypotension. Remains on TC. Still with moderate secretions but no  infectious symptoms. Remains weak and anuric. Now has tunneled cath in place. Cardiac output stable. Unable to stand  General:  Cachetic weak HEENT: normal + cor-trak Neck: supple. no JVD. On TC with mild secretions  Carotids 2+ bilat; no bruits. No lymphadenopathy or thryomegaly appreciated. + tunneled cath Cor: Wound ok PMI nondisplaced. Regular rate & rhythm. No rubs, gallops or murmurs. Lungs: coarse Abdomen: soft, nontender, nondistended. No hepatosplenomegaly. No bruits or masses. Good bowel sounds. Extremities: no cyanosis, clubbing, rash, edema Neuro: alert & orientedx3, cranial nerves grossly intact. moves all 4 extremities w/o difficulty. Affect pleasant  Remains very tenuous. Now back on NE at 3 despite full dose midodrine. Anuric. Volume status ok on iHD. On TC but still with secretions.   Will try to wean NE again today. Hopefully we can liberate from pressors so he can tolerate HD and we can get him to [facility] for long-term recovery.    CRITICAL CARE Performed by: Glori Bickers  Total critical care time: 35 minutes  Critical care time was exclusive of separately billable procedures and treating other patients.  Critical care was necessary to treat or prevent imminent or life-threatening deterioration.  Critical care was time spent personally by me (independent of midlevel providers or residents) on the following activities: development of treatment plan with patient and/or surrogate as well as nursing, discussions with consultants, evaluation of patient's response to treatment, examination of patient, obtaining history from patient or surrogate, ordering and performing treatments and interventions, ordering and review of laboratory studies, ordering and review of radiographic studies, pulse oximetry and re-evaluation of patient's condition.  Glori Bickers, MD  9:35 AM

## 2019-05-22 NOTE — Progress Notes (Addendum)
[facility] DAILY ICU PROGRESS NOTE                   [address]            [place] [address]          [id number]   20 Days Post-Op Procedure(s) (LRB): VIDEO BRONCHOSCOPY USING DISPOSABLE ANESTHESIA SCOPE (N/A) TRACHEOSTOMY (N/A)  Total Length of Stay:  LOS: 38 days   Subjective: Patient sleeping and not awakend  Objective: Vital signs in last 24 hours: Temp:  [96.8 F (36 C)-97.9 F (36.6 C)] 97.7 F (36.5 C) (05/06 0300) Pulse Rate:  [58-77] 72 (05/06 0645) Cardiac Rhythm: Normal sinus rhythm (05/06 0400) Resp:  [10-26] 20 (05/06 0645) BP: (83-145)/(45-113) 100/56 (05/06 0645) SpO2:  [86 %-98 %] 94 % (05/06 0645) FiO2 (%):  [35 %-80 %] 80 % (05/06 0400) Weight:  [55.4 kg-57.9 kg] 55.4 kg (05/06 0500)  Filed Weights   05/21/19 0434 05/21/19 1300 05/22/19 0500  Weight: 57.9 kg 57.9 kg 55.4 kg    Weight change: 0 kg      Intake/Output from previous day: 05/05 0701 - 05/06 0700 In: 827 [I.V.:47; NG/GT:780] Out: 2000   Intake/Output this shift: No intake/output data recorded.  Current Meds: Scheduled Meds: . aspirin EC  325 mg Oral Daily   Or  . aspirin  324 mg Per Tube Daily  . B-complex with vitamin C  1 tablet Per Tube Daily  . chlorhexidine gluconate (MEDLINE KIT)  15 mL Mouth Rinse BID  . Chlorhexidine Gluconate Cloth  6 each Topical Q0600  . citalopram  10 mg Per Tube Daily  . clonazePAM  0.5 mg Oral QHS  . dextrose      . docusate  200 mg Per Tube Daily  . feeding supplement (PRO-STAT SUGAR FREE 64)  30 mL Oral BID  . insulin aspart  4 Units Subcutaneous Q4H  . insulin glargine  16 Units Subcutaneous BID  . mouth rinse  15 mL Mouth Rinse 10 times per day  . metoCLOPramide (REGLAN) injection  5 mg Intravenous Q8H  . midodrine  15 mg Per Tube Q8H  . neomycin-polymyxin-hydrocortisone  3 drop Left EAR Q12H  . pantoprazole sodium  40 mg Per Tube BID  . sevelamer carbonate  0.8 g Per Tube TID WC  . sodium chloride flush  10-40 mL  Intracatheter Q12H   Continuous Infusions: . feeding supplement (NEPRO CARB STEADY) 1,000 mL (05/21/19 1800)  . heparin 10,000 units/ 20 mL infusion syringe 1,000 Units/hr (05/15/19 0624)  . lactated ringers Stopped (04/20/2019 1640)  . norepinephrine (LEVOPHED) Adult infusion 2 mcg/min (05/22/19 0600)   PRN Meds:.alum & mag hydroxide-simeth, diphenhydrAMINE, Gerhardt's butt cream, heparin, levalbuterol, ondansetron (ZOFRAN) IV, oxyCODONE, [START ON 05/26/2019] pneumococcal 23 valent vaccine, sodium chloride flush  Heart: RRR Lungs: Rhonchi Abdomen: Soft,  bowel sounds present Extremities: No LE edema Wound: Clean and dry. Has eschar lower half of sternal wound  Lab Results: CBC: Recent Labs    05/21/19 0359 05/22/19 0402  WBC 9.6 10.0  HGB 9.0* 8.6*  HCT 29.1* 28.3*  PLT 142* 144*   BMET:  Recent Labs    05/21/19 0359 05/22/19 0402  NA 130* 132*  K 5.4* 4.2  CL 88* 93*  CO2 26 27  GLUCOSE 227* 134*  BUN 100* 50*  CREATININE 3.54* 2.29*  CALCIUM 8.7* 8.6*    CMET: Lab Results  Component Value Date   WBC 10.0 05/22/2019   HGB  8.6 (L) 05/22/2019   HCT 28.3 (L) 05/22/2019   PLT 144 (L) 05/22/2019   GLUCOSE 134 (H) 05/22/2019   CHOL 174 08/21/2017   TRIG 143 08/21/2017   HDL 57 08/21/2017   LDLCALC 88 08/21/2017   ALT 56 (H) 05/15/2019   AST 44 (H) 05/15/2019   NA 132 (L) 05/22/2019   K 4.2 05/22/2019   CL 93 (L) 05/22/2019   CREATININE 2.29 (H) 05/22/2019   BUN 50 (H) 05/22/2019   CO2 27 05/22/2019   TSH 0.925 03/24/2019   INR 1.6 (H) 05/01/2019   HGBA1C 9.0 (H) 04/09/2019   MICROALBUR 30 08/21/2017    PT/INR:  No results for input(s): LABPROT, INR in the last 72 hours. Radiology: No results found.  Assessment/Plan: S/P Procedure(s) (LRB): VIDEO BRONCHOSCOPY USING DISPOSABLE ANESTHESIA SCOPE (N/A) TRACHEOSTOMY (N/A)  1. CV-S/p removal of Impella on 04/09.  S/p V tach arrest 04/15. Previous a fib. SR with HR in the 70's this am.  On  Midodrine 15  mg tid and Levophed 2 mcg/min.Marland Kitchen Receiving intermittent CVVHD and remains pressor dependent.   Co ox this am decreased to 64.4 (has been off Milrinone). Per Dr. Prescott Gum, Nor epi to be used to keep SBP 100 or > 2. Pulmonary-S/p trach 04/16. Trach changed yesterday to #6 (cuffless). CXR this am appears to show diffuse bilateral interstitial and alveolar airspace disease, no pneumothorax, small right pleural effusion. 3. Expected post op blood loss anemia-H and H this am slightly decreased to 8.6 and 28.3 4. DM-CBGs 177/163/127. On Insulin. He is on Insulin and this has been increased for better glucose control over the last 2 days. He was on Metformin 1000 mg bid prior to surgery, but will continue on Insulin for now as NPO. Pre op HGA1C 9. He will need close medical follow up after discharge 5. AKI-Anuric. Creatinine decreased to 2.29 this am and potassium normal at 4.2. Nephrology following and arranging for CVVHD accordingly 6. GI-severe malnutrition of chronic illness.  Cortrak, TFs. Speech pathology evaluation done yesterday and recommendations to be followed accordingly. Albumin remains 2.2 7. Elevated transaminases (likely related to shock)-Last  AST 44, ALT 56, ALk phos 469 and have been stable   9. Acute systolic heart failure-CVVHD helping with volume status 11. Extremely deconditioned-will need PT as able. He did not want to participate with PT yesterday but did do approved bed exercises   [name] PA-C 05/22/2019 7:20 AM   Agree with above assessment and plan Secretions, CXR worse  -   ABG ok but Fio2 uo to 80% Aggressive airway suctioning, physio therapy and repeat sputum cultures - low threshold for antibiotics Will need SNF for trach since LTAC not an option due to insurance P [employer]

## 2019-05-22 NOTE — Progress Notes (Addendum)
NAME:  Brendan [name], MRN:  [id number], DOB:  June 28, 1955, LOS: 26 ADMISSION DATE:  03/22/2019, CONSULTATION DATE:  04/17/19 REFERRING MD:  Dr. Haroldine Laws, CHIEF COMPLAINT:  Respiratory distress   Brief History   32 yoM originally presented with SOB and fatigue at OHS found to have new HFrEF, NSTEMI, and bilateral pleural effusions transferred to [facility] on 3/29 for further cardiac evaluation. 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 Started on lasix and milrinone gtts however developed NSVT. Was taken 4/1 for placement of IABP and swan for optimization prior to CABG. Was on precedex for agitation/ confusion, some concern for DTs. On the evening of 4/1, patient developed worsening respiratory distress and hypoxia, PCCM consulted for intubation and vent management.  Patient was extubated on 4/14 and developed worsening hypoxia on BiPAP. Overnight he developed pulseless VT requiring 1 minute of CPR and epinephrine to achieve ROSC. Neurovascularly intact afterward. Subsequently had increased work of breathing and worsening respiratory distress requiring intubation. Patient had tracheostomy performed on 04/24/2019. Trying to wean off ventilator.   Past Medical History  Tobacco abuse, HTN, poorly controlled diabetes, diabetic neuropathy  Significant Hospital Events   3/30 Lt heart cath/TEE performed 4/1 Intubated, RHC/IABP/PA cath 4/2 Impella placement 4/3 febrile over evening and night. Blood cultures obtained. Vanc/cefepime started. Hematuria--heparin held. 4/4 Extubated,abx transitioned to rocephin. Heparin restarted 4/6 Re-intubated for CABG/MVR 4/7 Extubated from CABG to BiPAP 4/9 Impella removed  4/9 TEE 4/10 CVC inserted &CRRT initiated  4/12 PCCMre-consulted re-intubation 4/14 Extubated to BiPAP 4/15 Re-intubated due to respiratory distress 4/16 Tracheostomy 4/27 bronch with BAL  Consults:  TCTS PCCM HF Nephrology  Procedures:  4/15ET tube - 4/15 4/9 Cortrak> 4/6 Rt  IJ> 4/10 HD cath- 4/13 4/12 foley cath> 3/30 PICC> 4/13 HD cath> 4/16 Tracheostomy>  Significant Diagnostic Tests:  3/30 R/ LHC >>  Ost LM to Mid LM lesion is 35% stenosed.  Prox LAD lesion is 90% stenosed.  Prox LAD to Mid LAD lesion is 50% stenosed.  Mid Cx lesion is 100% stenosed.  Prox RCA to Mid RCA lesion is 90% stenosed.  RPDA lesion is 95% stenosed.  LV end diastolic pressure is severely elevated.  Hemodynamic findings consistent with moderate pulmonary hypertension. 1. Severe 3 vessel obstructive CAD 2. High LV filling pressures 3. Reduced cardiac output with index 2.3 4. Moderate pulmonary HTN.  3/30TTE >> 1. Left ventricular ejection fraction, by estimation, is 30 to 35%. The left ventricle has moderately decreased function. The left ventricle demonstrates regional wall motion abnormalities.There is mild left ventricular hypertrophy. Left ventricular diastolic function could not be evaluated. There is severe hypokinesis of the left ventricular, entire inferior wall, inferoseptal wall, apical segment and lateral wall.  2. Right ventricular systolic function is hyperdynamic. The right ventricular size is normal. There is moderately elevated pulmonary artery systolic pressure.  3. Decreased posterior leaflet motion due to ischemic tethering of the mitral valve leaflets.  4. The mitral valve is abnormal. Moderate to severe mitral valve regurgitation.  5. The aortic valve is tricuspid. Aortic valve regurgitation is not visualized. Mild aortic valve sclerosis is present, with no evidence of aortic valve stenosis.  6. The inferior vena cava is normal in size with greater than 50% respiratory variability, suggesting right atrial pressure of 3 mmHg.   4/1 CT chest w/o >>Large bilateral pleural effusions with associated atelectasis.Moderate to severe bilateral ground-glass opacities, likely reflecting pneumonia and/or edema.  4/1 RHC >> RA = 18 RV = 60/20  PA  = 63/29 (  14) PCW = 33 Fick cardiac output/index = 3.7/1.9 PVR = 2.5 WU FA sat = 98% PA sat = 47%, 49% PaPi = 2.2   Micro Data:  3/30 MRSA PCR >> neg 4/1 MRSA PCR >> neg 4/2 resp culture>>rare gram pos cocci, few candida albicans 4/3 Urine culture>>negative 4/4 blood cultures>>no growth  4/9 Surgical wound>> rare enterococcus faecalis 4/9 BCx2>>no gorwth 4/9 Urine Culture>> greater than 100k yeast 4/12 Resp >. Negative 4/16 BAL >> negative (AFB, fungal negative) 4/20 Resp >> rare yeast >>  4/20 blood >>  4/21 Resp >> rare yeast >>  4/26 trach aspirate>> few candida albicans 4/27 BAL fungus> yeast 4/27 BAL resp>>60K colonies yeast 4/27 BAL AFB>> negative 4/27 AFB culture >> 4/27 PJP smear>>   Antimicrobials:  PTA ceftriaxone/ doxycycline >> 3/29 Ceftriaxone 4/4-4/5 Cefepime 4/4>>4/11 vanc 4/4>>4/6 Rocephin 4/4>>4/5 Cefuroxime 4/6-4/7 Ampicillin 4/11 Vancomycin 4/12>>4/13 Cefepime 4/12>>4/13 Augmentin / unasyn 4/15> 4/20 Zosyn 4/20 >> 4/26 linezolid 4/20 >> 4/23 Fluconazole 4/28 >  Interim history/subjective:  No acute events.  No distress.  Objective   Blood pressure (!) 100/56, pulse 72, temperature 97.7 F (36.5 C), temperature source Oral, resp. rate 20, height _0  (1.803 m), weight 55.4 kg, SpO2 94 %. CVP:  [10 mmHg] 10 mmHg  FiO2 (%):  [35 %-80 %] 80 %   Intake/Output Summary (Last 24 hours) at 05/22/2019 0829 Last data filed at 05/22/2019 0800 Gross per 24 hour  Intake 741.85 ml  Output 2000 ml  Net -1258.15 ml   Filed Weights   05/21/19 0434 05/21/19 1300 05/22/19 0500  Weight: 57.9 kg 57.9 kg 55.4 kg    Examination:    General:  Frail gentleman who appears older than stated age, resting comfortably  Neuro:  Awake, alert, flat affect. Responds intermittently and follows basic commands HEENT:  [place]/AT, No JVD noted, PERRL Cardiovascular:  RRR, no MRG Lungs:  Clear. Moderate trach secretions.  Abdomen:  Soft, non-distended Musculoskeletal:  No  acute deformity Skin:  Intact, MMM   Assessment & Plan:   Acute hypoxic respiratory failure, multifactorial.  Pulmonary edema improved with more aggressive UF, but increased sputum production. Possibly candida pneumonia vs colonization.  - off vent since 4/30, seems tenuous, but has been maintaining well. Moderate secretions here and there. Plan Continue trach collar as tolerated and per TCTS PT / OT and mobilize as able Routine trach care  HFrEF (20-25%), cardiogenic shock, NSTEMI with three-vessel disease, NSVT Polymorphic VT Plan Per HF team   Severe Malnutrition Plan Continue enteral nutrition  AKI, possibly ATN in the setting of cardiogenic shock, anuric  Plan CRRT per nephro  Diabetes mellitus with hyperglycemia worsened with steroids Plan SSI  Elevated transaminase enzymes with cholestasis pattern, concern for congestive hepatopathy. Now on azole. Plan Monitor   Continue trach care per TCTS.  Nothing further to add.  PCCM will sign off.  Please do not hesitate to call us back if we can be of any further assistance.   Best practice:  Diet: Tube feeds Pain/Anxiety/Delirium protocol (if indicated):Fentanyl VAP protocol (if indicated):HOB 30 degrees  DVT prophylaxis: SCDs GI prophylaxis: PPI Glucose control:SSI Mobility: BR Code Status: Full  Family Communication:per primary team Disposition:ICU   Montey Hora, [facility] 05/22/2019, 8:35 AM

## 2019-05-22 NOTE — TOC Initial Note (Signed)
Transition of Care [facility]) - Initial/Assessment Note    Patient Details  Name: Brendan [name] MRN: [id number] Date of Birth: March 17, 1955  [facility] CM/SW Contact:    Curlene Labrum, RN Phone Number: 05/22/2019, 9:16 AM  Clinical Narrative:                 Case Management noted Cardiology Physician note for need for LTAC placement.  Financial Counseling contacted for possible emergency medicaid and Pete Pelt, CSW aware.  Lewayne Bunting, RNCM with admissions at [facility] at [phone] to inquire about possible acceptance of patient for admission if the facility will accept an Letter of Guarantee for payment pending emergency medicaid acceptance.  Will continue to follow and speak with the facility regarding this matter.  I will give the patient's spouse information today regarding the facility in [place]. [place], [place] for possible acceptance.  Expected Discharge Plan: Skilled Nursing Facility Barriers to Discharge: SNF Pending Medicaid, Continued Medical Work up, SNF Pending payor source - LOG   Patient Goals and CMS Choice Patient states their goals for this hospitalization and ongoing recovery are:: Patient in need of qualifying payor source for LTAC/SNF placement for vent/trach capability. CMS Medicare.gov Compare Post Acute Care list provided to:: Patient Represenative (must comment) Choice offered to / list presented to : Spouse  Expected Discharge Plan and Services Expected Discharge Plan: [place]   Discharge Planning Services: CM Consult Post Acute Care Choice: [place]                                        Prior Living Arrangements/Services   Lives with:: Spouse Patient language and need for interpreter reviewed:: Yes        Need for Family Participation in Patient Care: Yes (Comment) Care giver support system in place?: Yes (comment)   Criminal Activity/Legal Involvement Pertinent to  Current Situation/Hospitalization: No - Comment as needed  Activities of Daily Living Home Assistive Devices/Equipment: None ADL Screening (condition at time of admission) Patient's cognitive ability adequate to safely complete daily activities?: Yes Is the patient deaf or have difficulty hearing?: No Does the patient have difficulty seeing, even when wearing glasses/contacts?: No Does the patient have difficulty concentrating, remembering, or making decisions?: No Patient able to express need for assistance with ADLs?: Yes Does the patient have difficulty dressing or bathing?: Yes Independently performs ADLs?: Yes (appropriate for developmental age) Does the patient have difficulty walking or climbing stairs?: Yes Weakness of Legs: None Weakness of Arms/Hands: None  Permission Sought/Granted         Permission granted to share info w AGENCY: Left message with Nira Conn at [facility] for possible placement with LOG from [facility]  Permission granted to share info w Relationship: spouse     Emotional Assessment Appearance:: Appears older than stated age Attitude/Demeanor/Rapport: Gracious Affect (typically observed): Accepting Orientation: : Oriented to Self, Oriented to Place, Oriented to  Time Alcohol / Substance Use: Not Applicable Psych Involvement: No (comment)  Admission diagnosis:  NSTEMI (non-ST elevated myocardial infarction) ([place]) [I21.4] S/P MVR (mitral valve repair) [Z98.890] Patient Active Problem List   Diagnosis Date Noted  . Cardiogenic shock ([place]) 05/15/2019  . Acute respiratory failure with hypoxia ([place]) 05/15/2019  . Pressure injury of skin 05/12/2019  . Endotracheal tube present   . S/P CABG x 3   . Aspiration pneumonia ([place])   .  Chronic respiratory failure with hypoxia ([place])   . S/P MVR (mitral valve repair) 05/03/2019  . Pulmonary edema with congestive heart failure ([place])   . Severe mitral regurgitation   . 3-vessel CAD   . Protein-calorie  malnutrition, severe 04/16/2019  . Acute systolic congestive heart failure ([place])   . NSTEMI (non-ST elevated myocardial infarction) ([place]) 04/16/2019  . Uncontrolled type 2 diabetes mellitus with hyperglycemia ([place]) 08/22/2017  . Frequent falls 05/29/2017  . Adjustment disorder with depressed mood 05/29/2017  . Peripheral neuropathy 05/29/2017  . Lower extremity pain 05/19/2013  . Encounter for intubation 03/11/2012  . Elevated blood pressure 03/11/2012  . Type II diabetes mellitus with complication, uncontrolled ([place]) 03/15/2006   PCP:  [name], MD Pharmacy:   [facility], [address] [place] [address] Phone: [phone] Fax: [phone]     Social Determinants of Health (SDOH) Interventions    Readmission Risk Interventions No flowsheet data found.

## 2019-05-22 NOTE — Progress Notes (Signed)
ABG results given to Dr. Prescott Gum. No new orders at this time. RT will continue to monitor.

## 2019-05-22 NOTE — Progress Notes (Signed)
Physical Therapy Treatment Patient Details Name: Brendan [name] MRN: [id number] DOB: 08-Nov-1955 Today's Date: 05/22/2019    History of Present Illness Pt presented with SOB and fatigue at OHS found to have new HFrEF,  NSTEMI, and bilateral pleural effusions transferred to [facility] on 3/29 for further cardiac evaluation.  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  On the evening of 4/1, patient developed worsening respiratory distress and hypoxia, head R/L heart cath on 3/30 & R heart cath on 4/1 pt intubated on 4/1, extubated 4/4, had R & L chest tubes placed 4/2 due to bilateral pleural effusions, removed 4/5 and impella device placed 4/2.  CABG and MVR on 4/6. Extubated 4/7. Removal of impella 4/9. CVVH started 4/10 and off 4/30. Respiratory distress and reintubated 4/12. Extubated 4/14. Reintubated 4/15. Trach 4/16.  PMHx of Tobacco abuse, HTN, poorly controlled DM, diabetic neuropathy.  Stopped CRRT 4/29 and began IHD 5/1.  On trach collar 35% regularly as of 5/2.    PT Comments    Pt motivated to get OOB to chair during session. Pt reeducated on sternal precautions and indicated understanding of precautions with functional mobility. Sat pt up via foot egress and Pt able to tolerate LE therex with no desat. Pt able to sit to stand mod(A) +2, but had to sit back down due to desat (see below). Pt remains appropriate for physical therapy while in acute care setting to address decreased functional mobility and generalized weakness until d/c to next level of care.   Spo2 during LE therex 91-93% 10L O2 trach collar Spo2 sit to stand 80%  12L O2 trach collar Spo2 sitting 83-85% 25L O2 trach collar Spo2 supine 94% 12 L trach collar   Follow Up Recommendations  LTACH     Equipment Recommendations  Other (comment)(TBD)    Recommendations for Other Services       Precautions / Restrictions Precautions Precautions: Fall;Sternal Precaution Comments: flexiseal, trach collar    Mobility  Bed  Mobility Overal bed mobility: Needs Assistance Bed Mobility: Supine to Sit;Rolling Rolling: Min assist         General bed mobility comments: Used foot egress to sit in bed  Transfers Overall transfer level: Needs assistance   Transfers: Sit to/from Stand Sit to Stand: Mod assist;+2 physical assistance         General transfer comment: Attempted to sit to stand, sat back down due to Spo2 drop 80%. Titrated O2 25L trach collar, Pt O2 remained between 83-85% for several minutes. Layed back in bed Spo2 94% back on 80% FIO2, 12LO2 from wall.  Ambulation/Gait             General Gait Details: unable   Stairs             Wheelchair Mobility    Modified Rankin (Stroke Patients Only)       Balance Overall balance assessment: Needs assistance Sitting-balance support: Bilateral upper extremity supported;Feet supported Sitting balance-Leahy Scale: Poor     Standing balance support: Bilateral upper extremity supported;During functional activity Standing balance-Leahy Scale: Zero Standing balance comment: Pt able to stand x1 with mod(A)+2, desat and had to sit back down.                            Cognition Arousal/Alertness: Awake/alert Behavior During Therapy: WFL for tasks assessed/performed Overall Cognitive Status: Difficult to assess  General Comments: Pt able to follow one step commands, oriented to self, Pt indicated he wanted to get out of bed and to the chair today.      Exercises General Exercises - Lower Extremity Ankle Circles/Pumps: AROM;10 reps;Both;Seated Long Arc Quad: AROM;Seated;10 reps;Both    General Comments General comments (skin integrity, edema, etc.): Pt Spo2 91-93% 12L O2 trach collar with LE therex.      Pertinent Vitals/Pain Pain Assessment: No/denies pain    Home Living                      Prior Function            PT Goals (current goals can now be  found in the care plan section) Acute Rehab PT Goals Patient Stated Goal: not stated PT Goal Formulation: With patient Time For Goal Achievement: 06/05/19 Potential to Achieve Goals: Good Progress towards PT goals: Progressing toward goals    Frequency    Min 3X/week      PT Plan Current plan remains appropriate    Co-evaluation              AM-PAC PT "6 Clicks" Mobility   Outcome Measure  Help needed turning from your back to your side while in a flat bed without using bedrails?: A Lot Help needed moving from lying on your back to sitting on the side of a flat bed without using bedrails?: A Lot Help needed moving to and from a bed to a chair (including a wheelchair)?: Total Help needed standing up from a chair using your arms (e.g., wheelchair or bedside chair)?: A Lot Help needed to walk in hospital room?: Total Help needed climbing 3-5 steps with a railing? : Total 6 Click Score: 9    End of Session Equipment Utilized During Treatment: Gait belt;Oxygen Activity Tolerance: Patient limited by fatigue;Other (comment)(Desat during functional activity, unable to normalize in sitting) Patient left: in bed;with call bell/phone within reach Nurse Communication: Mobility status PT Visit Diagnosis: Other abnormalities of gait and mobility (R26.89);Muscle weakness (generalized) (M62.81)     Time: 7017-7939 PT Time Calculation (min) (ACUTE ONLY): 37 min  Charges:  $Therapeutic Exercise: 8-22 mins $Therapeutic Activity: 8-22 mins                     [employer] SPT 05/22/2019    Rolland Porter 05/22/2019, 1:40 PM

## 2019-05-22 NOTE — Progress Notes (Signed)
Patient ID: Brendan [name], male   DOB: 1955-12-04, 64 y.o.   MRN: [id number] S: Remains on levophed.  Became hypotensive and agitated with HD yesterday. O:BP (!) 100/56   Pulse 70   Temp 97.7 F (36.5 C) (Oral)   Resp 16   Ht _0  (1.803 m) Comment: measured x 3  Wt 55.4 kg   SpO2 94%   BMI 17.03 kg/m   Intake/Output Summary (Last 24 hours) at 05/22/2019 0851 Last data filed at 05/22/2019 0800 Gross per 24 hour  Intake 741.85 ml  Output 2000 ml  Net -1258.15 ml   Intake/Output: I/O last 3 completed shifts: In: 966.3 [I.V.:66.3; NG/GT:900] Out: 2000 [Other:2000]  Intake/Output this shift:  Total I/O In: 3.8 [I.V.:3.8] Out: -  Weight change: 0 kg Gen: cachectic, fatigued WM lying in bed in NAD on trach collar CVS: RRR no rub Resp: cta Abd: +BS, soft, NT/ND Ext: no edema  Recent Labs  Lab 05/16/19 0356 05/16/19 0356 05/17/19 0329 05/17/19 0329 05/17/19 0721 05/17/19 0721 05/17/19 1320 05/17/19 1320 05/18/19 0336 05/19/19 0340 05/19/19 1130 05/20/19 0322 05/21/19 0359 05/22/19 0402 05/22/19 0827  NA 134*   < > 134*   < > 134*   < > 136   < > 135 132* 134* 131* 130* 132* 131*  K 4.4   < > 6.2*   < > 5.9*   < > 4.2   < > 4.6 6.4* 3.9 4.7 5.4* 4.2 4.1  CL 101   < > 98   < > 95*   < > 98  --  95* 90* 95* 92* 88* 93*  --   CO2 24   < > 23   < > 23  --  27  --  28 26  --  _1 --   GLUCOSE 153*   < > 160*   < > 159*   < > 199*  --  161* 250* 150* 177* 227* 134*  --   BUN 69*   < > 119*   < > 127*   < > 41*  --  75* 117* 35* 61* 100* 50*  --   CREATININE 1.88*   < > 3.36*   < > 3.49*   < > 1.72*  --  2.64* 3.77* 1.40* 2.57* 3.54* 2.29*  --   ALBUMIN 2.0*  --  1.9*  --   --   --   --   --  2.1* 2.1*  --  2.4* 2.2* 2.3*  --   CALCIUM 8.4*   < > 8.5*   < > 8.7*  --  8.1*  --  8.1* 8.2*  --  8.4* 8.7* 8.6*  --   PHOS 4.8*  --  8.2*  --   --   --   --   --  8.7* >30.0*  --  6.7* 7.4* 5.1*  --    < > = values in this interval not displayed.   Liver Function  Tests: Recent Labs  Lab 05/20/19 0322 05/21/19 0359 05/22/19 0402  ALBUMIN 2.4* 2.2* 2.3*   No results for input(s): LIPASE, AMYLASE in the last 168 hours. No results for input(s): AMMONIA in the last 168 hours. CBC: Recent Labs  Lab 05/18/19 0336 05/18/19 0336 05/19/19 0340 05/19/19 1130 05/20/19 0322 05/20/19 0322 05/21/19 0359 05/22/19 0402 05/22/19 0827  WBC 11.5*   < > 14.1*  --  9.6  --  9.6 10.0  --  HGB 9.6*   < > 9.7*   < > 9.3*   < > 9.0* 8.6* 9.9*  HCT 31.5*   < > 31.7*   < > 30.0*   < > 29.1* 28.3* 29.0*  MCV 98.7  --  100.3*  --  100.3*  --  99.0 100.7*  --   PLT 151   < > 138*  --  125*  --  142* 144*  --    < > = values in this interval not displayed.   Cardiac Enzymes: No results for input(s): CKTOTAL, CKMB, CKMBINDEX, TROPONINI in the last 168 hours. CBG: Recent Labs  Lab 05/21/19 2010 05/21/19 2037 05/22/19 0005 05/22/19 0358 05/22/19 0833  GLUCAP 53* 177* 163* 127* 144*    Iron Studies: No results for input(s): IRON, TIBC, TRANSFERRIN, FERRITIN in the last 72 hours. Studies/Results: DG Chest Port 1 View  Result Date: 05/22/2019 CLINICAL DATA:  Tracheostomy, respiratory failure, MVR, diabetes mellitus, hypertension, history NSTEMI EXAM: PORTABLE CHEST 1 VIEW COMPARISON:  Portable exam 0552 hours compared to 05/21/2019 FINDINGS: Tracheostomy tube projects over tracheal air column. Feeding tube extends into stomach. RIGHT arm PICC line tip projects over RIGHT atrium. LEFT jugular dual-lumen central venous catheter with tip projecting over RIGHT atrium. Normal heart size post MVR and CABG. Diffuse BILATERAL pulmonary infiltrates favor pulmonary edema though multifocal infection is not excluded with this appearance. No pleural effusion or pneumothorax. IMPRESSION: Severe diffuse BILATERAL airspace infiltrates favor pulmonary edema, slightly increased from prior study. Electronically Signed   By: Lavonia Dana M.D.   On: 05/22/2019 08:08   DG Chest Port 1  View  Result Date: 05/21/2019 CLINICAL DATA:  Recent mitral valve replacement EXAM: PORTABLE CHEST 1 VIEW COMPARISON:  05/18/2019 FINDINGS: Tracheostomy tube in satisfactory position. Right-sided PICC line with the tip projecting over the SVC. Left jugular large-bore central venous catheter with the tip projecting over the right atrium. Nasogastric tube coursing below the diaphragm. Diffuse bilateral interstitial and alveolar airspace opacities most severe in a perihilar distribution. Possible trace right pleural effusion. No pneumothorax. Stable cardiomediastinal silhouette. Prior CABG. Prior mitral valve replacement. No acute osseous abnormality. IMPRESSION: Support lines and tubing in satisfactory position. Florid pulmonary edema. Electronically Signed   By: Kathreen Devoid   On: 05/21/2019 09:31   . aspirin EC  325 mg Oral Daily   Or  . aspirin  324 mg Per Tube Daily  . B-complex with vitamin C  1 tablet Per Tube Daily  . chlorhexidine gluconate (MEDLINE KIT)  15 mL Mouth Rinse BID  . Chlorhexidine Gluconate Cloth  6 each Topical Q0600  . citalopram  10 mg Per Tube Daily  . clonazePAM  0.5 mg Oral QHS  . docusate  200 mg Per Tube Daily  . feeding supplement (PRO-STAT SUGAR FREE 64)  30 mL Oral BID  . insulin aspart  4 Units Subcutaneous Q4H  . insulin glargine  14 Units Subcutaneous BID  . mouth rinse  15 mL Mouth Rinse 10 times per day  . metoCLOPramide (REGLAN) injection  5 mg Intravenous Q8H  . midodrine  15 mg Per Tube Q8H  . neomycin-polymyxin-hydrocortisone  3 drop Left EAR Q12H  . pantoprazole sodium  40 mg Per Tube BID  . sevelamer carbonate  0.8 g Per Tube TID WC  . sodium chloride flush  10-40 mL Intracatheter Q12H    BMET    Component Value Date/Time   NA 131 (L) 05/22/2019 0827   K 4.1 05/22/2019 0827  CL 93 (L) 05/22/2019 0402   CO2 27 05/22/2019 0402   GLUCOSE 134 (H) 05/22/2019 0402   BUN 50 (H) 05/22/2019 0402   CREATININE 2.29 (H) 05/22/2019 0402   CALCIUM 8.6 (L)  05/22/2019 0402   GFRNONAA 30 (L) 05/22/2019 0402   GFRAA 34 (L) 05/22/2019 0402   CBC    Component Value Date/Time   WBC 10.0 05/22/2019 0402   RBC 2.81 (L) 05/22/2019 0402   HGB 9.9 (L) 05/22/2019 0827   HCT 29.0 (L) 05/22/2019 0827   PLT 144 (L) 05/22/2019 0402   MCV 100.7 (H) 05/22/2019 0402   MCH 30.6 05/22/2019 0402   MCHC 30.4 05/22/2019 0402   RDW 17.3 (H) 05/22/2019 0402   LYMPHSABS 1.1 05/13/2019 0402   MONOABS 1.1 (H) 04/28/2019 0402   EOSABS 0.2 04/28/2019 0402   BASOSABS 0.0 05/03/2019 0402     Brief HPI: admitted to outside hospital on 3/26 with SOB, new low EF and NSTEMI [facility]: 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 Admission Scr was 0.99.  Assessment/Plan:  1. Oliguric/anuricAKI- CRRT started on 4/10 and taken off 4/29. Transitioned to IHD on 05/17/19 and today is on his second session but with low bp and requiring increase in levophed. 1. Remains oliguric/anuric. 2. Wean levophed as able.  Unable to wean off due to ongoing hypotension 3. Plan for HD again tomorrow.  Able to UF 2 liters 05/21/19 but dropped pressure at the end of treatment and became agitated.  Plan to decrease UF goal for tomorrow. 2. Vascular access- left IJ tunneled HD catheter placed by IR on 05/19/19 3. CAD- 3 vessel with EF 25% s/p CABG and MVR 01/19/42 complicated by cardiogenic shock 4. Acute systolic CHF and cardiogenic shock- still on pressors and inotrope dependent. Volume improved with CRRT/IHD 5. Acute hypoxic respiratory failure- s/p trach 6. DM- per primary 7. MR s/p MVR 8. Polymorphic VT- off amio 9. Severe debility- will need LTAC but needs to remain off of pressors.  Donetta Potts, MD [employer] [phone]

## 2019-05-23 ENCOUNTER — Inpatient Hospital Stay (HOSPITAL_COMMUNITY): Payer: Medicaid Other

## 2019-05-23 LAB — GRAM STAIN

## 2019-05-23 LAB — RENAL FUNCTION PANEL
Albumin: 2.1 g/dL — ABNORMAL LOW (ref 3.5–5.0)
Albumin: 1.9 g/dL — ABNORMAL LOW (ref 3.5–5.0)
Anion gap: 10 (ref 5–15)
Anion gap: 15 (ref 5–15)
BUN: 74 mg/dL — ABNORMAL HIGH (ref 8–23)
BUN: 91 mg/dL — ABNORMAL HIGH (ref 8–23)
CO2: 26 mmol/L (ref 22–32)
CO2: 23 mmol/L (ref 22–32)
Calcium: 8.7 mg/dL — ABNORMAL LOW (ref 8.9–10.3)
Calcium: 8.9 mg/dL (ref 8.9–10.3)
Chloride: 95 mmol/L — ABNORMAL LOW (ref 98–111)
Chloride: 93 mmol/L — ABNORMAL LOW (ref 98–111)
Creatinine, Ser: 3.44 mg/dL — ABNORMAL HIGH (ref 0.61–1.24)
Creatinine, Ser: 4.06 mg/dL — ABNORMAL HIGH (ref 0.61–1.24)
GFR calc Af Amer: 21 mL/min — ABNORMAL LOW (ref >60)
GFR calc Af Amer: 17 mL/min — ABNORMAL LOW (ref >60)
GFR calc non Af Amer: 18 mL/min — ABNORMAL LOW (ref >60)
GFR calc non Af Amer: 15 mL/min — ABNORMAL LOW (ref >60)
Glucose, Bld: 165 mg/dL — ABNORMAL HIGH (ref 70–99)
Glucose, Bld: 114 mg/dL — ABNORMAL HIGH (ref 70–99)
Phosphorus: 5.6 mg/dL — ABNORMAL HIGH (ref 2.5–4.6)
Phosphorus: 3.7 mg/dL (ref 2.5–4.6)
Potassium: 4.2 mmol/L (ref 3.5–5.1)
Potassium: 4.9 mmol/L (ref 3.5–5.1)
Sodium: 131 mmol/L — ABNORMAL LOW (ref 135–145)
Sodium: 131 mmol/L — ABNORMAL LOW (ref 135–145)

## 2019-05-23 LAB — COOXEMETRY PANEL
Carboxyhemoglobin: 2 % — ABNORMAL HIGH (ref 0.5–1.5)
Methemoglobin: 0.6 % (ref 0.0–1.5)
O2 Saturation: 72.6 %
Total hemoglobin: 8.9 g/dL — ABNORMAL LOW (ref 12.0–16.0)

## 2019-05-23 LAB — POCT I-STAT 7, (LYTES, BLD GAS, ICA,H+H)
Acid-Base Excess: 0 mmol/L (ref 0.0–2.0)
Acid-Base Excess: 2 mmol/L (ref 0.0–2.0)
Acid-Base Excess: 2 mmol/L (ref 0.0–2.0)
Bicarbonate: 29.9 mmol/L — ABNORMAL HIGH (ref 20.0–28.0)
Bicarbonate: 29.2 mmol/L — ABNORMAL HIGH (ref 20.0–28.0)
Bicarbonate: 25.8 mmol/L (ref 20.0–28.0)
Calcium, Ion: 1.33 mmol/L (ref 1.15–1.40)
Calcium, Ion: 1.33 mmol/L (ref 1.15–1.40)
Calcium, Ion: 1.21 mmol/L (ref 1.15–1.40)
HCT: 31 % — ABNORMAL LOW (ref 39.0–52.0)
HCT: 30 % — ABNORMAL LOW (ref 39.0–52.0)
HCT: 25 % — ABNORMAL LOW (ref 39.0–52.0)
Hemoglobin: 10.5 g/dL — ABNORMAL LOW (ref 13.0–17.0)
Hemoglobin: 10.2 g/dL — ABNORMAL LOW (ref 13.0–17.0)
Hemoglobin: 8.5 g/dL — ABNORMAL LOW (ref 13.0–17.0)
O2 Saturation: 63 %
O2 Saturation: 99 %
O2 Saturation: 96 %
Patient temperature: 97.5
Patient temperature: 98.2
Potassium: 4.2 mmol/L (ref 3.5–5.1)
Potassium: 4.8 mmol/L (ref 3.5–5.1)
Potassium: 4.6 mmol/L (ref 3.5–5.1)
Sodium: 130 mmol/L — ABNORMAL LOW (ref 135–145)
Sodium: 130 mmol/L — ABNORMAL LOW (ref 135–145)
Sodium: 130 mmol/L — ABNORMAL LOW (ref 135–145)
TCO2: 32 mmol/L (ref 22–32)
TCO2: 31 mmol/L (ref 22–32)
TCO2: 27 mmol/L (ref 22–32)
pCO2 arterial: 80.3 mmHg (ref 32.0–48.0)
pCO2 arterial: 58.4 mmHg — ABNORMAL HIGH (ref 32.0–48.0)
pCO2 arterial: 35.2 mmHg (ref 32.0–48.0)
pH, Arterial: 7.175 — CL (ref 7.350–7.450)
pH, Arterial: 7.307 — ABNORMAL LOW (ref 7.350–7.450)
pH, Arterial: 7.473 — ABNORMAL HIGH (ref 7.350–7.450)
pO2, Arterial: 41 mmHg — ABNORMAL LOW (ref 83.0–108.0)
pO2, Arterial: 141 mmHg — ABNORMAL HIGH (ref 83.0–108.0)
pO2, Arterial: 76 mmHg — ABNORMAL LOW (ref 83.0–108.0)

## 2019-05-23 LAB — GLUCOSE, CAPILLARY
Glucose-Capillary: 148 mg/dL — ABNORMAL HIGH (ref 70–99)
Glucose-Capillary: 122 mg/dL — ABNORMAL HIGH (ref 70–99)
Glucose-Capillary: 89 mg/dL (ref 70–99)
Glucose-Capillary: 86 mg/dL (ref 70–99)
Glucose-Capillary: 101 mg/dL — ABNORMAL HIGH (ref 70–99)
Glucose-Capillary: 127 mg/dL — ABNORMAL HIGH (ref 70–99)

## 2019-05-23 LAB — MAGNESIUM: Magnesium: 2.5 mg/dL — ABNORMAL HIGH (ref 1.7–2.4)

## 2019-05-23 MED ORDER — VANCOMYCIN HCL 750 MG/150ML IV SOLN
750.0000 mg | INTRAVENOUS | Status: DC
  Administered 2019-05-24 – 2019-05-25 (×2): 750 mg via INTRAVENOUS
  Filled 2019-05-23 (×2): qty 150

## 2019-05-23 MED ORDER — VANCOMYCIN VARIABLE DOSE PER UNSTABLE RENAL FUNCTION (PHARMACIST DOSING): Status: DC

## 2019-05-23 MED ORDER — PRISMASOL BGK 4/2.5 32-4-2.5 MEQ/L IV SOLN
INTRAVENOUS | Status: DC
  Administered 2019-06-04: 1 mL via INTRAVENOUS_CENTRAL

## 2019-05-23 MED ORDER — FENTANYL CITRATE (PF) 100 MCG/2ML IJ SOLN
12.5000 ug | INTRAMUSCULAR | Status: DC | PRN
  Administered 2019-05-24 (×3): 12.5 ug via INTRAVENOUS
  Filled 2019-05-23 (×3): qty 2

## 2019-05-23 MED ORDER — LORAZEPAM 2 MG/ML IJ SOLN: 1.0000 mg | Freq: Once | INTRAMUSCULAR | Status: AC

## 2019-05-23 MED ORDER — HEPARIN SODIUM (PORCINE) 1000 UNIT/ML DIALYSIS
1000.0000 [IU] | INTRAMUSCULAR | Status: DC | PRN
  Administered 2019-05-26 – 2019-06-16 (×4): 4000 [IU] via INTRAVENOUS_CENTRAL
  Filled 2019-05-23: qty 3
  Filled 2019-05-23: qty 6
  Filled 2019-05-23: qty 3
  Filled 2019-05-23 (×2): qty 1
  Filled 2019-05-23 (×2): qty 6
  Filled 2019-05-23: qty 2
  Filled 2019-05-23 (×3): qty 4
  Filled 2019-05-23 (×2): qty 6

## 2019-05-23 MED ORDER — MIDAZOLAM HCL 2 MG/2ML IJ SOLN
INTRAMUSCULAR | Status: AC
  Administered 2019-05-23: 1 mg
  Filled 2019-05-23: qty 2

## 2019-05-23 MED ORDER — METOCLOPRAMIDE HCL 5 MG/ML IJ SOLN
5.0000 mg | Freq: Four times a day (QID) | INTRAMUSCULAR | Status: DC
  Administered 2019-05-23 – 2019-05-24 (×5): 5 mg via INTRAVENOUS
  Filled 2019-05-23 (×5): qty 2

## 2019-05-23 MED ORDER — FLUCONAZOLE 100MG IVPB: 100.0000 mg | INTRAVENOUS | Status: DC

## 2019-05-23 MED ORDER — PRISMASOL BGK 4/2.5 32-4-2.5 MEQ/L REPLACEMENT SOLN
Status: DC
  Administered 2019-06-04: 1 via INTRAVENOUS_CENTRAL

## 2019-05-23 MED ORDER — NEOMYCIN-POLYMYXIN-HC 3.5-10000-1 OT SUSP
3.0000 [drp] | Freq: Two times a day (BID) | OTIC | Status: AC
  Administered 2019-05-23 – 2019-06-01 (×19): 3 [drp] via OTIC

## 2019-05-23 MED ORDER — HEPARIN (PORCINE) 2000 UNITS/L FOR CRRT
INTRAVENOUS_CENTRAL | Status: DC | PRN
  Administered 2019-05-26 – 2019-05-31 (×3): 2000 mL via INTRAVENOUS_CENTRAL
  Filled 2019-05-23 (×7): qty 1000

## 2019-05-23 MED ORDER — FENTANYL CITRATE (PF) 100 MCG/2ML IJ SOLN: 25.0000 ug | Freq: Once | INTRAMUSCULAR | Status: AC

## 2019-05-23 MED ORDER — FENTANYL CITRATE (PF) 100 MCG/2ML IJ SOLN
INTRAMUSCULAR | Status: AC
  Administered 2019-05-23: 25 ug
  Filled 2019-05-23: qty 2

## 2019-05-23 MED ORDER — INSULIN ASPART 100 UNIT/ML ~~LOC~~ SOLN
1.0000 [IU] | SUBCUTANEOUS | Status: DC
  Administered 2019-05-23 – 2019-05-28 (×19): 1 [IU] via SUBCUTANEOUS

## 2019-05-23 MED ORDER — VITAL 1.5 CAL PO LIQD
1000.0000 mL | ORAL | Status: DC
  Administered 2019-05-23 – 2019-05-28 (×7): 1000 mL
  Filled 2019-05-23 (×10): qty 1000

## 2019-05-23 MED ORDER — VANCOMYCIN HCL 1250 MG/250ML IV SOLN
1250.0000 mg | Freq: Once | INTRAVENOUS | Status: AC
  Administered 2019-05-23: 17:00:00 1250 mg via INTRAVENOUS
  Filled 2019-05-23: qty 250

## 2019-05-23 MED ORDER — SODIUM CHLORIDE 0.9 % IV SOLN: INTRAVENOUS | Status: DC | PRN

## 2019-05-23 MED ORDER — FENTANYL CITRATE (PF) 100 MCG/2ML IJ SOLN
INTRAMUSCULAR | Status: AC
  Administered 2019-05-23: 13:00:00 25 ug via INTRAVENOUS
  Filled 2019-05-23: qty 2

## 2019-05-23 MED ORDER — LIDOCAINE HCL (PF) 1 % IJ SOLN
INTRAMUSCULAR | Status: AC
  Administered 2019-05-23: 16:00:00 5 mL
  Filled 2019-05-23: qty 5

## 2019-05-23 MED ORDER — SODIUM CHLORIDE 0.9 % IV SOLN
2.0000 g | Freq: Two times a day (BID) | INTRAVENOUS | Status: AC
  Administered 2019-05-23 – 2019-05-30 (×14): 2 g via INTRAVENOUS
  Filled 2019-05-23 (×14): qty 2

## 2019-05-23 MED ORDER — LORAZEPAM 2 MG/ML IJ SOLN
INTRAMUSCULAR | Status: AC
  Administered 2019-05-23: 1 mg via INTRAVENOUS
  Filled 2019-05-23: qty 1

## 2019-05-23 MED ORDER — CHLORHEXIDINE GLUCONATE 0.12 % MT SOLN
OROMUCOSAL | Status: AC
  Filled 2019-05-23: qty 15

## 2019-05-23 NOTE — Progress Notes (Signed)
RT NOTE: RT transported patient on ventilator from room 2H07 to CT and back to room 0V57 with no complications. Vitals are stable. RT will continue to monitor.

## 2019-05-23 NOTE — Progress Notes (Addendum)
[facility] DAILY ICU PROGRESS NOTE                   [address]            [place] [address]          [phone]   21 Days Post-Op Procedure(s) (LRB): VIDEO BRONCHOSCOPY USING DISPOSABLE ANESTHESIA SCOPE (N/A) TRACHEOSTOMY (N/A)  Total Length of Stay:  LOS: 39 days   Subjective: Patient became lethargic,difficult to arouse, and desaturation earlier this am. ABG showed Ph=7.1 and PCO2=82.5 (respiratory acidosis). Patient placed back on vent. CCM changed trach tube from cuffless to cuffed. Patient is not happy about soft restraints but because he was pulling at things, placed for his safety  Objective: Vital signs in last 24 hours: Temp:  [97.5 F (36.4 C)-98.2 F (36.8 C)] 97.5 F (36.4 C) (05/07 0300) Pulse Rate:  [70-82] 82 (05/07 0645) Cardiac Rhythm: Normal sinus rhythm (05/07 0400) Resp:  [9-33] 28 (05/07 0630) BP: (76-121)/(41-85) 102/78 (05/07 0645) SpO2:  [79 %-100 %] 100 % (05/07 0645) FiO2 (%):  [60 %-80 %] 80 % (05/07 0623) Weight:  [59.2 kg] 59.2 kg (05/07 0500)  Filed Weights   05/21/19 1300 05/22/19 0500 05/23/19 0500  Weight: 57.9 kg 55.4 kg 59.2 kg    Weight change: 1.3 kg   CVP:  [8 mmHg-20 mmHg] 9 mmHg  Intake/Output from previous day: 05/06 0701 - 05/07 0700 In: 942.4 [I.V.:92.4; NG/GT:650; IV Piggyback:200] Out: -   Intake/Output this shift: No intake/output data recorded.  Current Meds: Scheduled Meds: . aspirin EC  325 mg Oral Daily   Or  . aspirin  324 mg Per Tube Daily  . B-complex with vitamin C  1 tablet Per Tube Daily  . chlorhexidine gluconate (MEDLINE KIT)  15 mL Mouth Rinse BID  . Chlorhexidine Gluconate Cloth  6 each Topical Q0600  . citalopram  10 mg Per Tube Daily  . clonazePAM  0.5 mg Oral QHS  . docusate  200 mg Per Tube Daily  . feeding supplement (PRO-STAT SUGAR FREE 64)  30 mL Oral BID  . insulin aspart  4 Units Subcutaneous Q4H  . insulin glargine  14 Units Subcutaneous BID  . mouth rinse  15 mL Mouth  Rinse 10 times per day  . metoCLOPramide (REGLAN) injection  5 mg Intravenous Q8H  . midodrine  15 mg Per Tube Q8H  . neomycin-polymyxin-hydrocortisone  3 drop Left EAR Q12H  . pantoprazole sodium  40 mg Per Tube BID  . sevelamer carbonate  0.8 g Per Tube TID WC  . sodium chloride flush  10-40 mL Intracatheter Q12H   Continuous Infusions: . ceFEPime (MAXIPIME) IV Stopped (05/22/19 2245)  . feeding supplement (NEPRO CARB STEADY) 1,000 mL (05/21/19 1800)  . lactated ringers Stopped (04/30/2019 1640)  . norepinephrine (LEVOPHED) Adult infusion 7 mcg/min (05/23/19 0600)   PRN Meds:.alum & mag hydroxide-simeth, diphenhydrAMINE, Gerhardt's butt cream, heparin, levalbuterol, ondansetron (ZOFRAN) IV, oxyCODONE, [START ON 05/26/2019] pneumococcal 23 valent vaccine, sodium chloride flush  Heart: RRR Lungs: Rhonchi Abdomen: Soft,  bowel sounds present Extremities: No LE edema Wound: Clean and dry.   Lab Results: CBC: Recent Labs    05/21/19 0359 05/21/19 0359 05/22/19 0402 05/22/19 0402 05/22/19 0827 05/23/19 0534  WBC 9.6  --  10.0  --   --   --   HGB 9.0*   < > 8.6*   < > 9.9* 10.5*  HCT 29.1*   < > 28.3*   < >  29.0* 31.0*  PLT 142*  --  144*  --   --   --    < > = values in this interval not displayed.   BMET:  Recent Labs    05/22/19 0402 05/22/19 0827 05/23/19 0333 05/23/19 0534  NA 132*   < > 131* 130*  K 4.2   < > 4.2 4.2  CL 93*  --  95*  --   CO2 27  --  26  --   GLUCOSE 134*  --  165*  --   BUN 50*  --  74*  --   CREATININE 2.29*  --  3.44*  --   CALCIUM 8.6*  --  8.7*  --    < > = values in this interval not displayed.    CMET: Lab Results  Component Value Date   WBC 10.0 05/22/2019   HGB 10.5 (L) 05/23/2019   HCT 31.0 (L) 05/23/2019   PLT 144 (L) 05/22/2019   GLUCOSE 165 (H) 05/23/2019   CHOL 174 08/21/2017   TRIG 143 08/21/2017   HDL 57 08/21/2017   LDLCALC 88 08/21/2017   ALT 56 (H) 05/15/2019   AST 44 (H) 05/15/2019   NA 130 (L) 05/23/2019   K  4.2 05/23/2019   CL 95 (L) 05/23/2019   CREATININE 3.44 (H) 05/23/2019   BUN 74 (H) 05/23/2019   CO2 26 05/23/2019   TSH 0.925 03/24/2019   INR 1.6 (H) 05/01/2019   HGBA1C 9.0 (H) 04/09/2019   MICROALBUR 30 08/21/2017    PT/INR:  No results for input(s): LABPROT, INR in the last 72 hours. Radiology: Portable Chest x-ray  Result Date: 05/23/2019 CLINICAL DATA:  Changed tracheostomy tube. EXAM: PORTABLE CHEST 1 VIEW COMPARISON:  Prior study same day.  05/22/2019. FINDINGS: Tracheostomy tube noted good anatomic position. Feeding tube tip below left hemidiaphragm. Large caliber left central line in unchanged position with tip over right atrium. Right PICC line noted with tip in unchanged position at cavoatrial junction position. Prior CABG and cardiac valve replacement. Cardiomegaly again noted. Diffuse severe bilateral pulmonary infiltrates/edema unchanged. No prominent pleural effusion. No pneumothorax. Surgical clips right chest. IMPRESSION: 1. Tracheostomy tube appears to be in good anatomic position. Remaining lines and tubes in unchanged position. 2. Prior CABG. Prior cardiac valve replacement. Cardiomegaly again noted. 3. Diffuse severe bilateral pulmonary infiltrates/edema again noted without interim change. Electronically Signed   By: Marcello Moores  Register   On: 05/23/2019 07:15    Assessment/Plan: S/P Procedure(s) (LRB): VIDEO BRONCHOSCOPY USING DISPOSABLE ANESTHESIA SCOPE (N/A) TRACHEOSTOMY (N/A)  1. CV-S/p removal of Impella on 04/09.  S/p V tach arrest 04/15. Previous a fib. SR with HR in the 80's this am.  On  Midodrine 15 mg tid and Levophed 7 mcg/min.Marland Kitchen Receiving intermittent CVVHD and remains pressor dependent.   Co ox this am decreased to 72.6 (has been off Milrinone). Per Dr. Prescott Gum, Nor epi to be used to keep SBP 100 or > 2. Pulmonary-S/p trach 04/16. Trach changed this am to #6 (cuffed). CXR this am appears to show diffuse bilateral interstitial and alveolar airspace disease, no  pneumothorax, small right pleural effusion. 3. Expected post op blood loss anemia-H and H yesterday slightly decreased to 8.6 and 28.3 4. DM-CBGs 147/88/148. On Insulin. He is on Insulin and this has been increased for better glucose control over the last 2 days. He was on Metformin 1000 mg bid prior to surgery, but will continue on Insulin for now as NPO. Pre op  HGA1C 9. He will need close medical follow up after discharge 5. AKI-Anuric. Creatinine decreased to 3.44 this am and potassium normal at 4.2. Nephrology following and arranging for CVVHD accordingly 6. GI-severe malnutrition of chronic illness.  Cortrak, TFs. Speech pathology evaluation done yesterday and recommendations to be followed accordingly. Albumin 2.1 7. Elevated transaminases (likely related to shock)-Last  AST 44, ALT 56, ALk phos 469 and have been stable   9. Acute systolic heart failure-CVVHD helping with volume status 11. Extremely deconditioned-will need PT as able. He did not want to participate with PT yesterday but did do approved bed exercises 12. Because of patient's lack of insurance, LTAC is not an option. He will need SNF  [name] PA-C 05/23/2019 7:19 AM   Back on vent for hypercarbia - PCO2 80 with lethargy Chest CT repeated- dense bilat infiltrates c/w ARDS   Also with R>L pleural effusions Will place R chest tube, add Vanco to antibiotic coverage until cultures back New L brachial a-line placed today - sterile technique Downturn in patients condition d/w daughter Lawerance Bach on phone call- she understands going back on vent is a set back but unless it appears that chance of recovery is futile she agrees with continued support. She agreed to R chest tube placement  patient examined and medical record reviewed,agree with above note. [name] 05/23/2019  20 F R chest tube placed with drainage of 1 L pleural fluid- clear sent for culture [employer]

## 2019-05-23 NOTE — Progress Notes (Signed)
EVENING ROUNDS NOTE :     [address]       ,[place] [address]             [phone]                 21 Days Post-Op Procedure(s) (LRB): VIDEO BRONCHOSCOPY USING DISPOSABLE ANESTHESIA SCOPE (N/A) TRACHEOSTOMY (N/A)  Total Length of Stay:  LOS: 39 days  BP (!) 120/49   Pulse 83   Temp 98.7 F (37.1 C) (Oral)   Resp (!) 21   Ht 5\' 11"  (1.803 m) Comment: measured x 3  Wt 59.2 kg   SpO2 100%   BMI 18.20 kg/m   .Intake/Output      05/07 0701 - 05/08 0700   I.V. (mL/kg) 78.4 (1.3)   NG/GT 576   IV Piggyback 350.1   Total Intake(mL/kg) 1004.5 (17)   Other 406   Chest Tube 910   Total Output 1316   Net -311.6         .  prismasol BGK 4/2.5 500 mL/hr at 05/23/19 1625  .  prismasol BGK 4/2.5 300 mL/hr at 05/23/19 1625  . sodium chloride 10 mL/hr at 05/23/19 1704  . ceFEPime (MAXIPIME) IV Stopped (05/23/19 1052)  . feeding supplement (VITAL 1.5 CAL) 1,000 mL (05/23/19 1424)  . lactated ringers Stopped (05/01/2019 1640)  . norepinephrine (LEVOPHED) Adult infusion 4 mcg/min (05/23/19 1200)  . prismasol BGK 4/2.5 1,500 mL/hr at 05/23/19 1957  . [START ON 05/24/2019] vancomycin       Lab Results  Component Value Date   WBC 10.0 05/22/2019   HGB 8.5 (L) 05/23/2019   HCT 25.0 (L) 05/23/2019   PLT 144 (L) 05/22/2019   GLUCOSE 114 (H) 05/23/2019   CHOL 174 08/21/2017   TRIG 143 08/21/2017   HDL 57 08/21/2017   LDLCALC 88 08/21/2017   ALT 56 (H) 05/15/2019   AST 44 (H) 05/15/2019   NA 130 (L) 05/23/2019   K 4.6 05/23/2019   CL 93 (L) 05/23/2019   CREATININE 4.06 (H) 05/23/2019   BUN 91 (H) 05/23/2019   CO2 23 05/23/2019   TSH 0.925 04/09/2019   INR 1.6 (H) 05/01/2019   HGBA1C 9.0 (H) 04/16/2019   MICROALBUR 30 08/21/2017   Back on vent due to pco2 retention 1000 out of chect tube since placed this am  Grace Isaac MD  Beeper [phone] Office [phone] 05/23/2019 8:10 PM

## 2019-05-23 NOTE — Progress Notes (Signed)
Patient ID: Brendan [name], male   DOB: 03-22-1955, 64 y.o.   MRN: [id number] S: Pt with hypercarbic, hypoxic respiratory arrest this am.  Back on vent and had to increase levophed.  CXR revealed worsening airspace opacities as well as pleural effusions. O:BP 125/62   Pulse 85   Temp 98 F (36.7 C) (Oral)   Resp 13   Ht 5' 11" (1.803 m) Comment: measured x 3  Wt 59.2 kg   SpO2 100%   BMI 18.20 kg/m   Intake/Output Summary (Last 24 hours) at 05/23/2019 0903 Last data filed at 05/23/2019 0800 Gross per 24 hour  Intake 1000.81 ml  Output -  Net 1000.81 ml   Intake/Output: I/O last 3 completed shifts: In: 1513.1 [I.V.:113.1; NG/GT:1200; IV Piggyback:200] Out: -   Intake/Output this shift:  Total I/O In: 115 [I.V.:15; NG/GT:100] Out: -  Weight change: 1.3 kg Gen: cachectic WM on vent via trach CVS: RRR Resp: scattered wheezes and rhonchi  Abd: +BS, soft, NT/ND Ext: no edema  Recent Labs  Lab 05/17/19 0329 05/17/19 0721 05/17/19 1320 05/17/19 1320 05/18/19 0336 05/18/19 0336 05/19/19 0340 05/19/19 0340 05/19/19 1130 05/20/19 0322 05/21/19 0359 05/22/19 0402 05/22/19 0827 05/23/19 0333 05/23/19 0534  NA 134*   < > 136   < > 135   < > 132*   < > 134* 131* 130* 132* 131* 131* 130*  K 6.2*   < > 4.2   < > 4.6   < > 6.4*   < > 3.9 4.7 5.4* 4.2 4.1 4.2 4.2  CL 98   < > 98   < > 95*  --  90*  --  95* 92* 88* 93*  --  95*  --   CO2 23   < > 27  --  28  --  26  --   --  _0 --  26  --   GLUCOSE 160*   < > 199*   < > 161*  --  250*  --  150* 177* 227* 134*  --  165*  --   BUN 119*   < > 41*   < > 75*  --  117*  --  35* 61* 100* 50*  --  74*  --   CREATININE 3.36*   < > 1.72*   < > 2.64*  --  3.77*  --  1.40* 2.57* 3.54* 2.29*  --  3.44*  --   ALBUMIN 1.9*  --   --   --  2.1*  --  2.1*  --   --  2.4* 2.2* 2.3*  --  2.1*  --   CALCIUM 8.5*   < > 8.1*  --  8.1*  --  8.2*  --   --  8.4* 8.7* 8.6*  --  8.7*  --   PHOS 8.2*  --   --   --  8.7*  --  >30.0*  --   --  6.7* 7.4*  5.1*  --  5.6*  --    < > = values in this interval not displayed.   Liver Function Tests: Recent Labs  Lab 05/21/19 0359 05/22/19 0402 05/23/19 0333  ALBUMIN 2.2* 2.3* 2.1*   No results for input(s): LIPASE, AMYLASE in the last 168 hours. No results for input(s): AMMONIA in the last 168 hours. CBC: Recent Labs  Lab 05/18/19 9622 05/18/19 2979 05/19/19 0340 05/19/19 1130 05/20/19 8921 05/20/19 1941 05/21/19 7408 05/21/19 0359 05/22/19 0402  05/22/19 0827 05/23/19 0534  WBC 11.5*   < > 14.1*  --  9.6  --  9.6  --  10.0  --   --   HGB 9.6*   < > 9.7*   < > 9.3*   < > 9.0*   < > 8.6* 9.9* 10.5*  HCT 31.5*   < > 31.7*   < > 30.0*   < > 29.1*   < > 28.3* 29.0* 31.0*  MCV 98.7  --  100.3*  --  100.3*  --  99.0  --  100.7*  --   --   PLT 151   < > 138*  --  125*  --  142*  --  144*  --   --    < > = values in this interval not displayed.   Cardiac Enzymes: No results for input(s): CKTOTAL, CKMB, CKMBINDEX, TROPONINI in the last 168 hours. CBG: Recent Labs  Lab 05/22/19 1534 05/22/19 2001 05/22/19 2328 05/23/19 0349 05/23/19 0750  GLUCAP 129* 147* 88 148* 122*    Iron Studies: No results for input(s): IRON, TIBC, TRANSFERRIN, FERRITIN in the last 72 hours. Studies/Results: DG Chest Port 1 View  Result Date: 05/23/2019 CLINICAL DATA:  Shortness of breath EXAM: PORTABLE CHEST 1 VIEW COMPARISON:  May 22, 2019. FINDINGS: Tracheostomy catheter tip is 5.0 cm above the carina. Left central catheter tip is in the right atrium, slightly beyond the cavoatrial junction. Right central catheter tip is at the cavoatrial junction. No pneumothorax. There is widespread interstitial and alveolar edema, similar to 1 day prior. There is cardiomegaly with pulmonary venous hypertension. Patient is status post coronary artery bypass grafting and mitral valve replacement. No adenopathy evident. There are surgical clips in the right axillary region. IMPRESSION: Tube and catheter positions as described  without pneumothorax. Widespread interstitial and alveolar edema. A degree of superimposed pneumonia cannot be excluded radiographically. Stable cardiac silhouette with evidence of underlying pulmonary venous hypertension. Electronically Signed   By: Lowella Grip III M.D.   On: 05/23/2019 08:21   Portable Chest x-ray  Result Date: 05/23/2019 CLINICAL DATA:  Changed tracheostomy tube. EXAM: PORTABLE CHEST 1 VIEW COMPARISON:  Prior study same day.  05/22/2019. FINDINGS: Tracheostomy tube noted good anatomic position. Feeding tube tip below left hemidiaphragm. Large caliber left central line in unchanged position with tip over right atrium. Right PICC line noted with tip in unchanged position at cavoatrial junction position. Prior CABG and cardiac valve replacement. Cardiomegaly again noted. Diffuse severe bilateral pulmonary infiltrates/edema unchanged. No prominent pleural effusion. No pneumothorax. Surgical clips right chest. IMPRESSION: 1. Tracheostomy tube appears to be in good anatomic position. Remaining lines and tubes in unchanged position. 2. Prior CABG. Prior cardiac valve replacement. Cardiomegaly again noted. 3. Diffuse severe bilateral pulmonary infiltrates/edema again noted without interim change. Electronically Signed   By: Marcello Moores  Register   On: 05/23/2019 07:15   DG Chest Port 1 View  Result Date: 05/22/2019 CLINICAL DATA:  Tracheostomy, respiratory failure, MVR, diabetes mellitus, hypertension, history NSTEMI EXAM: PORTABLE CHEST 1 VIEW COMPARISON:  Portable exam 0552 hours compared to 05/21/2019 FINDINGS: Tracheostomy tube projects over tracheal air column. Feeding tube extends into stomach. RIGHT arm PICC line tip projects over RIGHT atrium. LEFT jugular dual-lumen central venous catheter with tip projecting over RIGHT atrium. Normal heart size post MVR and CABG. Diffuse BILATERAL pulmonary infiltrates favor pulmonary edema though multifocal infection is not excluded with this  appearance. No pleural effusion or pneumothorax. IMPRESSION: Severe diffuse BILATERAL  airspace infiltrates favor pulmonary edema, slightly increased from prior study. Electronically Signed   By: Lavonia Dana M.D.   On: 05/22/2019 08:08   . aspirin EC  325 mg Oral Daily   Or  . aspirin  324 mg Per Tube Daily  . B-complex with vitamin C  1 tablet Per Tube Daily  . chlorhexidine gluconate (MEDLINE KIT)  15 mL Mouth Rinse BID  . Chlorhexidine Gluconate Cloth  6 each Topical Q0600  . citalopram  10 mg Per Tube Daily  . docusate  200 mg Per Tube Daily  . feeding supplement (PRO-STAT SUGAR FREE 64)  30 mL Oral BID  . insulin aspart  4 Units Subcutaneous Q4H  . insulin glargine  14 Units Subcutaneous BID  . mouth rinse  15 mL Mouth Rinse 10 times per day  . metoCLOPramide (REGLAN) injection  5 mg Intravenous Q8H  . midodrine  15 mg Per Tube Q8H  . neomycin-polymyxin-hydrocortisone  3 drop Left EAR Q12H  . neomycin-polymyxin-hydrocortisone  3 drop Left EAR Q12H  . pantoprazole sodium  40 mg Per Tube BID  . sevelamer carbonate  0.8 g Per Tube TID WC  . sodium chloride flush  10-40 mL Intracatheter Q12H    BMET    Component Value Date/Time   NA 130 (L) 05/23/2019 0534   K 4.2 05/23/2019 0534   CL 95 (L) 05/23/2019 0333   CO2 26 05/23/2019 0333   GLUCOSE 165 (H) 05/23/2019 0333   BUN 74 (H) 05/23/2019 0333   CREATININE 3.44 (H) 05/23/2019 0333   CALCIUM 8.7 (L) 05/23/2019 0333   GFRNONAA 18 (L) 05/23/2019 0333   GFRAA 21 (L) 05/23/2019 0333   CBC    Component Value Date/Time   WBC 10.0 05/22/2019 0402   RBC 2.81 (L) 05/22/2019 0402   HGB 10.5 (L) 05/23/2019 0534   HCT 31.0 (L) 05/23/2019 0534   PLT 144 (L) 05/22/2019 0402   MCV 100.7 (H) 05/22/2019 0402   MCH 30.6 05/22/2019 0402   MCHC 30.4 05/22/2019 0402   RDW 17.3 (H) 05/22/2019 0402   LYMPHSABS 1.1 05/10/2019 0402   MONOABS 1.1 (H) 05/12/2019 0402   EOSABS 0.2 05/13/2019 0402   BASOSABS 0.0 04/19/2019 0402    Brief HPI:  admitted to outside hospital on 3/26 with SOB, new low EF and NSTEMI txd to [facility] 3/29: 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 Admission Scr was 0.99.  Assessment/Plan:  1. Oliguric/anuricAKI- CRRT started on 4/10 and taken off 4/29. Transitioned to IHD on 05/17/19 and today is on his second session but with low bp and requiring increase in levophed. 1. Remains oliguric/anuric. 2. Acutely worse overnight with respiratory failure, CXR with progressive infiltrates, and will not be able to tolerate IHD.  Will transition back to CRRT and await Pulmonology evaluation of worsening CXR (I don't think this is related to CHF as he has been negative with HD and may be a pulmonary process or infection).  Will set UF goal of 0-50 ml/hr until we get a better handle on volume and pulmonary issues.  He appears dry on exam.  Pleural effusions may be due to hypoalbuminemia. 2. Vascular access- leftIJ tunneled HD catheter placed by IR on 05/19/19 3. CAD- 3 vessel with EF 25% s/p CABG and MVR 01/21/15 complicated by cardiogenic shock 4. Acute systolic CHF and cardiogenic shock- still on pressors with escalating requirements. Volume  improved with CRRT/IHD 5. Acute hypoxic respiratory failure- s/p trach back on vent.  Pulmonary infiltrates worsening on CXR.  Discussed with PCCM and plan for CT scan of chest and likely repeat bronch. 6. DM- per primary 7. MR s/p MVR 8. Polymorphic VT- off amio 9. Severe debility- will need LTACbut needs to remain off of pressors. Donetta Potts, MD [employer] [phone]

## 2019-05-23 NOTE — Procedures (Signed)
Tracheostomy Change Brendan [name] PCCM  Called around 0600 for Brendan [name] who had been lethargic and desaturating progressively throughout the night. ABG consistent with profound respiratory acidosis with pH 7.1 and pCO2 89.  Tracheostomy tube changed from #6 shiley cuffless to #6 shiley cuffed without difficult using tube exchanger. After exchange completed, capnography device utilized to confirm placement. Bilateral breath sounds ascultated.    Georgann Housekeeper, AGACNP-BC [place]  See Amion for personal pager PCCM on call pager [phone]  05/23/2019 6:37 AM

## 2019-05-23 NOTE — Progress Notes (Signed)
OT Cancellation Note  Patient Details Name: Brendan [name] MRN: [id number] DOB: Dec 19, 1955   Cancelled Treatment:    Reason Eval/Treat Not Completed: (Per RN pt going for chest CT, was placed back on vent and not medically ready for OT session this date. Will continue to follow)  Zenovia Jarred, MSOT, OTR/L [facility] Office Number: [phone] Pager: [place] 05/23/2019, 3:45 PM

## 2019-05-23 NOTE — Progress Notes (Signed)
NAME:  Brendan [name], MRN:  [id number], DOB:  14-Nov-1955, LOS: 90 ADMISSION DATE:  04/07/2019, CONSULTATION DATE:  04/17/19 REFERRING MD:  Dr. Haroldine Laws, CHIEF COMPLAINT:  Respiratory distress   Brief History   89 yoM originally presented with SOB and fatigue at [facility] found to have new HFrEF, NSTEMI, and bilateral pleural effusions transferred to [facility] on 3/29 for further cardiac evaluation. 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 Started on lasix and milrinone gtts however developed NSVT. Was taken 4/1 for placement of IABP and swan for optimization prior to CABG. Was on precedex for agitation/ confusion, some concern for DTs. On the evening of 4/1, patient developed worsening respiratory distress and hypoxia, PCCM consulted for intubation and vent management.  Patient was extubated on 4/14 and developed worsening hypoxia on BiPAP. Overnight he developed pulseless VT requiring 1 minute of CPR and epinephrine to achieve ROSC. Neurovascularly intact afterward. Subsequently had increased work of breathing and worsening respiratory distress requiring intubation. Patient had tracheostomy performed on 04/19/2019. Trying to wean off ventilator.   Past Medical History  Tobacco abuse, HTN, poorly controlled diabetes, diabetic neuropathy  Significant Hospital Events   3/30 Lt heart cath/TEE performed 4/1 Intubated, RHC/IABP/PA cath 4/2 Impella placement 4/3 febrile over evening and night. Blood cultures obtained. Vanc/cefepime started. Hematuria--heparin held. 4/4 Extubated,abx transitioned to rocephin. Heparin restarted 4/6 Re-intubated for CABG/MVR 4/7 Extubated from CABG to BiPAP 4/9 Impella removed  4/9 TEE 4/10 CVC inserted &CRRT initiated  4/12 PCCMre-consulted re-intubation 4/14 Extubated to BiPAP 4/15 Re-intubated due to respiratory distress 4/16 Tracheostomy 4/27 bronch with BAL 5/7 called back for hypercarbic failure. #6 cuffed placed.   Consults:   TCTS PCCM HF Nephrology  Procedures:  4/15ET tube - 4/15 4/9 Cortrak> 4/6 Rt IJ> 4/10 HD cath- 4/13 4/12 foley cath> 3/30 PICC> 4/13 HD cath> 4/16 Tracheostomy>  Significant Diagnostic Tests:  3/30 R/ LHC >>  Ost LM to Mid LM lesion is 35% stenosed.  Prox LAD lesion is 90% stenosed.  Prox LAD to Mid LAD lesion is 50% stenosed.  Mid Cx lesion is 100% stenosed.  Prox RCA to Mid RCA lesion is 90% stenosed.  RPDA lesion is 95% stenosed.  LV end diastolic pressure is severely elevated.  Hemodynamic findings consistent with moderate pulmonary hypertension. 1. Severe 3 vessel obstructive CAD 2. High LV filling pressures 3. Reduced cardiac output with index 2.3 4. Moderate pulmonary HTN.  3/30TTE >> 1. Left ventricular ejection fraction, by estimation, is 30 to 35%. The left ventricle has moderately decreased function. The left ventricle demonstrates regional wall motion abnormalities.There is mild left ventricular hypertrophy. Left ventricular diastolic function could not be evaluated. There is severe hypokinesis of the left ventricular, entire inferior wall, inferoseptal wall, apical segment and lateral wall.  2. Right ventricular systolic function is hyperdynamic. The right ventricular size is normal. There is moderately elevated pulmonary artery systolic pressure.  3. Decreased posterior leaflet motion due to ischemic tethering of the mitral valve leaflets.  4. The mitral valve is abnormal. Moderate to severe mitral valve regurgitation.  5. The aortic valve is tricuspid. Aortic valve regurgitation is not visualized. Mild aortic valve sclerosis is present, with no evidence of aortic valve stenosis.  6. The inferior vena cava is normal in size with greater than 50% respiratory variability, suggesting right atrial pressure of 3 mmHg.   4/1 CT chest w/o >>Large bilateral pleural effusions with associated atelectasis.Moderate to severe bilateral ground-glass  opacities, likely reflecting pneumonia and/or edema.  4/1 RHC >>  RA = 18 RV = 60/20  PA = 63/29 (42) PCW = 33 Fick cardiac output/index = 3.7/1.9 PVR = 2.5 WU FA sat = 98% PA sat = 47%, 49% PaPi = 2.2   Micro Data:  3/30 MRSA PCR >> neg 4/1 MRSA PCR >> neg 4/2 resp culture>>rare gram pos cocci, few candida albicans 4/3 Urine culture>>negative 4/4 blood cultures>>no growth  4/9 Surgical wound>> rare enterococcus faecalis 4/9 BCx2>>no gorwth 4/9 Urine Culture>> greater than 100k yeast 4/12 Resp >. Negative 4/16 BAL >> negative (AFB, fungal negative) 4/20 Resp >> rare yeast >>  4/20 blood >>  4/21 Resp >> rare yeast >>  4/26 trach aspirate>> few candida albicans 4/27 BAL fungus> yeast 4/27 BAL resp>>60K colonies yeast 4/27 BAL AFB>> negative 4/27 AFB culture >> 4/27 PJP smear>>   Antimicrobials:  PTA ceftriaxone/ doxycycline >> 3/29 Ceftriaxone 4/4-4/5 Cefepime 4/4>>4/11 vanc 4/4>>4/6 Rocephin 4/4>>4/5 Cefuroxime 4/6-4/7 Ampicillin 4/11 Vancomycin 4/12>>4/13 Cefepime 4/12>>4/13 Augmentin / unasyn 4/15> 4/20 Zosyn 4/20 >> 4/26 linezolid 4/20 >> 4/23 Fluconazole 4/28 >  Interim history/subjective:  Patient has had multiple episodes of hypoxemia throughout the night and has become progressively lethargic. ABG done at 5am showed respiratory acidosis with pH 7.1. PCCM re-consulted.   Objective   Blood pressure (!) 102/59, pulse 77, temperature (!) 97.5 F (36.4 C), temperature source Oral, resp. rate (!) 25, height _0  (1.803 m), weight 59.2 kg, SpO2 92 %. CVP:  [8 mmHg-20 mmHg] 9 mmHg  FiO2 (%):  [60 %-80 %] 80 %   Intake/Output Summary (Last 24 hours) at 05/23/2019 0624 Last data filed at 05/23/2019 0600 Gross per 24 hour  Intake 942.38 ml  Output --  Net 942.38 ml   Filed Weights   05/21/19 1300 05/22/19 0500 05/23/19 0500  Weight: 57.9 kg 55.4 kg 59.2 kg    Examination:   General:  Frail elderly male in NAD Neuro:  Lethargic. Arouses only to  pain.  HEENT:  [place]/AT, No JVD noted, PERRL Cardiovascular:  RRR, no MRG Lungs:  Clear, shallow respirations Abdomen: soft, non-distended, normoactive Musculoskeletal:  No acute deformity or ROM limitation.  Skin:  Intact, MMM   Assessment & Plan:   Acute hypoxic respiratory failure, multifactorial.  Pulmonary edema improved with more aggressive UF, but increased sputum production. Possibly candida pneumonia vs colonization.  - off vent since 4/30, seems tenuous, but has been maintaining well. Moderate secretions here and there. - Needed trach changed back to cuff for vent support in AM hours of 5/7. 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 Now on azole. Plan Monitor    Best practice:  Diet: Tube feeds Pain/Anxiety/Delirium protocol (if indicated):Fentanyl VAP protocol (if indicated):HOB 30 degrees  DVT prophylaxis: SCDs GI prophylaxis: PPI Glucose control:SSI Mobility: BR Code Status: Full  Family Communication:per primary team Disposition:ICU   Georgann Housekeeper, AGACNP-BC [place] for personal pager PCCM on call pager [phone]  05/23/2019 6:32 AM

## 2019-05-23 NOTE — Progress Notes (Addendum)
Pharmacy Antibiotic Note  Brendan [name] is a 64 y.o. male admitted on 04/13/2019 with severe 3 vessel disease s/p CABG on 4/6 who was re-intubated 4/12 and trached 4/14 has completed a course of antibiotics this stay and now with concern for a new pneumonia.  Pharmacy has been consulted for Vancomycin dosing. Cefepime was started 5/6. Last vancomycin dose this stay was on 4/15.   WBC is within normal limits and patient is afebrile. Patient is requiring levophed at 4 mcg/min. Patient went back on the ventilator this AM with current FiO2 40%.    Patient has been off CRRT since 4/29 and on HD. Last HD was on 5/5. Plan to switch back to CRRT today.   Addendum: Patient was able to resume CRRT at 1630 PM tonight.   Plan: Vancomycin 1250 mg IV x 1 now then follow-up plans for CRRT restart. Once CRRT resumes, will start Vancomycin 752m IV every 24 hours.  Monitor renal plans, culture results, and clinical status.  F/up repeat MRSA pcr  Height: _0  (180.3 cm)(measured x 3) Weight: 59.2 kg (130 lb 8.2 oz) IBW/kg (Calculated) : 75.3  Temp (24hrs), Avg:98.1 F (36.7 C), Min:97.5 F (36.4 C), Max:99.1 F (37.3 C)  Recent Labs  Lab 05/18/19 0336 05/18/19 0336 05/19/19 0340 05/19/19 0340 05/19/19 1130 05/20/19 0322 05/21/19 0359 05/22/19 0402 05/23/19 0333  WBC 11.5*  --  14.1*  --   --  9.6 9.6 10.0  --   CREATININE 2.64*   < > 3.77*   < > 1.40* 2.57* 3.54* 2.29* 3.44*   < > = values in this interval not displayed.    Estimated Creatinine Clearance: 18.9 mL/min (A) (by C-G formula based on SCr of 3.44 mg/dL (H)).    Allergies  Allergen Reactions  . Acetaminophen Itching    Antimicrobials this admission: Cefepime 4/4>4/13 Vanc 4/4>4/4; resume 4/12 > 4/15; 5/7 >> Rocephin 4/4 > 4/6 Ampicillin 4/12>4/12 Unasyn 4/16 >>4/20 Zosyn 4/20>>4/26 Linezolid 4/20>>4/23 Fluconazole 4/28>>5/4  Dose adjustments this admission:   Microbiology results: 4/2 R pleural fluid >  neg 4/4 BCx x 2 > neg 4/9 BCx - negative 4/9 Wound Cx > rare enterococcus faecalis (pan-S) 4/9 UCx >  > 100K yeast 4/19 BAL - rare E.faecalis (pan sensitive) 4/12 TA > ngF 4/16 BAL > ngF 4/20 TA > rare candida - not treating per ID 4/20 BCx > ngF 4/21 TA > rare candida - not treating per ID 4/27 BAL > 60k yeast 4/29 Trach asp > nf 5/6 Resp Cx > few GNR- pending 5/7 BCx >> 5/7 Pleural Fluid >> 5/7 MRSA PCR >>   Thank you for allowing pharmacy to be a part of this patient's care.  JSloan Leiter PharmD, BCPS, BCCCP Clinical Pharmacist Please refer to AGulfshore Endoscopy Incfor MNorris Citynumbers 05/23/2019 3:55 PM

## 2019-05-23 NOTE — Progress Notes (Addendum)
Patient ID: Brendan [name], male   DOB: 1955-01-19, 64 y.o.   MRN: [id number]     Advanced Heart Failure Rounding Note  PCP-Cardiologist: No primary care provider on file.   Subjective:    Continues on iHD.   This morning he was placed back on on the vent. Fio2 80%  Norepi was increased to 8 mcg.   Drowsy. Follows commands.   Objective:   Weight Range: 59.2 kg Body mass index is 18.2 kg/m.   Vital Signs:   Temp:  [97.5 F (36.4 C)-98.2 F (36.8 C)] 97.5 F (36.4 C) (05/07 0300) Pulse Rate:  [70-82] 82 (05/07 0645) Resp:  [9-33] 28 (05/07 0630) BP: (76-121)/(41-85) 102/78 (05/07 0645) SpO2:  [79 %-100 %] 100 % (05/07 0645) FiO2 (%):  [60 %-80 %] 80 % (05/07 0623) Weight:  [59.2 kg] 59.2 kg (05/07 0500) Last BM Date: 05/22/19  Weight change: Filed Weights   05/21/19 1300 05/22/19 0500 05/23/19 0500  Weight: 57.9 kg 55.4 kg 59.2 kg    Intake/Output:   Intake/Output Summary (Last 24 hours) at 05/23/2019 0651 Last data filed at 05/23/2019 0600 Gross per 24 hour  Intake 942.38 ml  Output --  Net 942.38 ml      Physical Exam  General:  Thin  No resp difficulty HEENT: normal Neck: trach , supple. JVP 9-10 . Carotids 2+ bilat; no bruits. No lymphadenopathy or thryomegaly appreciated. LIJ HD catheter Cor: PMI nondisplaced. Regular rate & rhythm. No rubs, gallops or murmurs. Lungs: coarse throughout.  Abdomen: soft, nontender, nondistended. No hepatosplenomegaly. No bruits or masses. Good bowel sounds. Extremities: no cyanosis, clubbing, rash, R and LLE trace-1+ edema Neuro: Drowsy on vent. Follows commands.    Telemetry  NSR 60-70s personally reviewed.   Labs    CBC Recent Labs    05/21/19 0359 05/21/19 0359 05/22/19 0402 05/22/19 0402 05/22/19 0827 05/23/19 0534  WBC 9.6  --  10.0  --   --   --   HGB 9.0*   < > 8.6*   < > 9.9* 10.5*  HCT 29.1*   < > 28.3*   < > 29.0* 31.0*  MCV 99.0  --  100.7*  --   --   --   PLT 142*  --  144*  --   --   --     < > = values in this interval not displayed.   Basic Metabolic Panel Recent Labs    05/22/19 0402 05/22/19 0827 05/23/19 0333 05/23/19 0534  NA 132*   < > 131* 130*  K 4.2   < > 4.2 4.2  CL 93*  --  95*  --   CO2 27  --  26  --   GLUCOSE 134*  --  165*  --   BUN 50*  --  74*  --   CREATININE 2.29*  --  3.44*  --   CALCIUM 8.6*  --  8.7*  --   MG 2.3  --  2.5*  --   PHOS 5.1*  --  5.6*  --    < > = values in this interval not displayed.   Liver Function Tests Recent Labs    05/22/19 0402 05/23/19 0333  ALBUMIN 2.3* 2.1*   No results for input(s): LIPASE, AMYLASE in the last 72 hours. Cardiac Enzymes No results for input(s): CKTOTAL, CKMB, CKMBINDEX, TROPONINI in the last 72 hours.  BNP: BNP (last 3 results) Recent Labs    04/07/2019 0113  BNP  655.7*    ProBNP (last 3 results) No results for input(s): PROBNP in the last 8760 hours.   D-Dimer No results for input(s): DDIMER in the last 72 hours. Hemoglobin A1C No results for input(s): HGBA1C in the last 72 hours. Fasting Lipid Panel No results for input(s): CHOL, HDL, LDLCALC, TRIG, CHOLHDL, LDLDIRECT in the last 72 hours. Thyroid Function Tests No results for input(s): TSH, T4TOTAL, T3FREE, THYROIDAB in the last 72 hours.  Invalid input(s): FREET3  Other results:   Imaging    No results found.   Medications:     Scheduled Medications: . aspirin EC  325 mg Oral Daily   Or  . aspirin  324 mg Per Tube Daily  . B-complex with vitamin C  1 tablet Per Tube Daily  . chlorhexidine gluconate (MEDLINE KIT)  15 mL Mouth Rinse BID  . Chlorhexidine Gluconate Cloth  6 each Topical Q0600  . citalopram  10 mg Per Tube Daily  . clonazePAM  0.5 mg Oral QHS  . docusate  200 mg Per Tube Daily  . feeding supplement (PRO-STAT SUGAR FREE 64)  30 mL Oral BID  . insulin aspart  4 Units Subcutaneous Q4H  . insulin glargine  14 Units Subcutaneous BID  . mouth rinse  15 mL Mouth Rinse 10 times per day  .  metoCLOPramide (REGLAN) injection  5 mg Intravenous Q8H  . midodrine  15 mg Per Tube Q8H  . neomycin-polymyxin-hydrocortisone  3 drop Left EAR Q12H  . pantoprazole sodium  40 mg Per Tube BID  . sevelamer carbonate  0.8 g Per Tube TID WC  . sodium chloride flush  10-40 mL Intracatheter Q12H    Infusions: . ceFEPime (MAXIPIME) IV Stopped (05/22/19 2245)  . feeding supplement (NEPRO CARB STEADY) 1,000 mL (05/21/19 1800)  . lactated ringers Stopped (04/20/2019 1640)  . norepinephrine (LEVOPHED) Adult infusion 7 mcg/min (05/23/19 0600)    PRN Medications: alum & mag hydroxide-simeth, diphenhydrAMINE, Gerhardt's butt cream, heparin, levalbuterol, ondansetron (ZOFRAN) IV, oxyCODONE, [START ON 05/26/2019] pneumococcal 23 valent vaccine, sodium chloride flush     Assessment/Plan   1. Acute systolic HF ->  Cardiogenic Shock  - Due to iCM. EF 20-25% - Impella 5.5 placed on 4/2.   - s/p CABG/MVRepair on 4/6  - Impella out 4/9.  - Echo 4/10 with EF 25-30%, normal RV, no MR.  - Echo 4/22 EF 30-35% mildly reduced RV MVR ok   -Norepi increased to 8 mcg this morning.  - CO-OX stable.  - Continue midodrine 15 mg tid.   2. 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 Echo stable - No chest pain.  - On ASA/Crestor  3. Acute hypoxic respiratory failure - Due to pulmonary edema and PNA - Extubated 4/9 re-intubated on 4/12. Failed re-extubation on 4/14. Now s/p tracheostomy - Completed antibiotic course.  - S/P Bronch 4/27 - 60K yeast. -> ? Colonization.  - Back on the vent through TC this morning.  -  Continue volume control through HD  4. Mitral Regurgitation - Mod-severe on ECHO - s/p MV repair, TEE 4/9 with minimal MR s/p repair.   - Echo 4/10 with no significant MR.  - echo 4/22 MVR stable  5. Uncontrolled DM - Hgb A1C 9.  - On insulin and sliding scale.  - No change  6. AKI - due to shock - Remains anuric post-op - Continue iHD per nephrology   7.  Acute blood  loss Anemia  - Hgb stable.  - No obvious bleeding.  - transfuse hgb < 7.5   8. Polymorphic VT - Recurrent VT during respiratory distress 4/14, - Off amio  - No further VT.  - Keep K> 4.0 Mg > 2.0  - no change  9. Severe Malnutrition - Prealbumin improved, up from 8.9>>18.7 - Nutrition on board - Continue w/ TFs  10. Atrial fibrillation. paroxysmal - Remain in NSR  - Amiodarone was stopped.   31. Debility, severe - as above will likely need LTAC on d/c  12. Chronic pain issues - pain meds being tapered  13. Hyponatremia  Darrick Grinder, NP  05/23/2019 6:51 AM    Agree with above.  Much worse overnight with severe respiratory acidosis and hypercarbia. CXR with worsening infiltrates. Placed back on vent. Trach changed. Now on NE 8.   General:  Cachetic. Chronically-ill appearing.  HEENT: normal + cor trak Neck: supple. + trach. Moderate secretions  Carotids 2+ bilat; no bruits. No lymphadenopathy or thryomegaly appreciated. Cor: PMI nondisplaced. Regular rate & rhythm. No rubs, gallops or murmurs. Lungs: diffuse rhonchi  Abdomen: soft, nontender, nondistended. No hepatosplenomegaly. No bruits or masses. Good bowel sounds. Extremities: no cyanosis, clubbing, rash, edema cachetic Neuro: alert & orientedx3, cranial nerves grossly intact. moves all 4 extremities w/o difficulty.   He is critically ill now with worsening respiratory status and vent dependence. Will need to push volume removal today (may need to restart CVVHD). Would consider repeat bronch to assess as well. Sputum cx from 5/6 in progress.   I am very concerned about his trajectory.   CRITICAL CARE Performed by: Glori Bickers  Total critical care time: 35 minutes  Critical care time was exclusive of separately billable procedures and treating other patients.  Critical care was necessary to treat or prevent imminent or life-threatening deterioration.  Critical care was time spent personally by  me (independent of midlevel providers or residents) on the following activities: development of treatment plan with patient and/or surrogate as well as nursing, discussions with consultants, evaluation of patient's response to treatment, examination of patient, obtaining history from patient or surrogate, ordering and performing treatments and interventions, ordering and review of laboratory studies, ordering and review of radiographic studies, pulse oximetry and re-evaluation of patient's condition.    Glori Bickers, MD  7:58 AM

## 2019-05-23 NOTE — Progress Notes (Signed)
Pt became lethargic and difficult to arouse while on trach collar 10L 80%. RT at bedside to collect ABG.  pH: 7.1  CO2: 82.5  Bicarb: 29.9  Dr. Cyndia Bent paged with results. He would like pt to go back on ventilator and CCM to be notified.  [place] notified. Waiting for CCM MD to camera into room.

## 2019-05-23 NOTE — Progress Notes (Signed)
Spoke with Judson Roch NP with CCM. Asked if they would place arterial line as pt is a difficult stick. Sarah NP to ask Fuller Song or Dr. Lynetta Mare to place. RN notified as well. RT will continue to monitor.

## 2019-05-23 NOTE — Progress Notes (Signed)
Nutrition Follow-up  DOCUMENTATION CODES:   Severe malnutrition in context of chronic illness, Underweight  INTERVENTION:   Recommend considering PEG tube placement for long term nutrition support. However, given nationwide shortage of PEG tubes, it may be a while before pt could have one placed.   Tube Feeding via Cortrak:  Change to Vital 1.5 at 60 ml/hr Pro-Stat 30 mL BID Provides 127 g of protein, 2360 kcals, 1094 mL of free water Meets 100% estimated calorie and protein needs  Continue B-complex with C   NUTRITION DIAGNOSIS:   Severe Malnutrition related to chronic illness as evidenced by severe muscle depletion, severe fat depletion.  Being addressed via TF   GOAL:   Patient will meet greater than or equal to 90% of their needs  Met  MONITOR:   Vent status, Diet advancement, Labs, Weight trends, TF tolerance  REASON FOR ASSESSMENT:   Consult Poor PO, Assessment of nutrition requirement/status  ASSESSMENT:   64 yo male admitted with acute systeolic CHF, CAD with 3 vessel disease with plan for CABG. PMH includes DM with HgbA1c 9 with neuropathy, HTN   3/30 Cardiac Cath with severe 3-vessel CAD 3/31 ECHO: EF 30-35% 4/01 IABP placed, Intubated 4/02 Impella placed, bilateral CT placed for pleural effusions 4/04 Extubated 4/06 CABG, MV repair, Intubated 4/07 Extubated 4/09 Impella removed, LVAD placed, Cortrak placed, TF initiated 4/10 CRRT initiated 4/12 Re-intubated 4/14 Extubated 4/15 Re-intubated 4/16 Trach, Bronchoscopy 4/27 Bronch 4/29 CRRT discontinued 4/30 off vent, TC x 24 hours 5/01 1st iHD 5/07 back on CRRT and vent support  Pt with worsening respiratory status overnight, back on vent support via trach. Transitionig back to CRRT, requiring pressors  Nepro at 50 ml/hr, Pro-Stat 30 mL BID. Due to change in status, plan to change TF formula  Pt did complain of nausea yesterday which has been an ongoing issue for patient  Labs: sodium 130  (L) Meds: B-complex with C, novolog q 4 hours, lantus, reglan, renvela  Diet Order:   Diet Order            Diet NPO time specified Except for: Ice Chips  Diet effective now              EDUCATION NEEDS:   Education needs have been addressed  Skin:  Skin Assessment: Skin Integrity Issues: Skin Integrity Issues:: Stage II, DTI DTI: Coccyx Stage II: perineum Incisions: R axilla, chest, bilateral legs, neck  Last BM:  5/6 rectal tube  Height:   Ht Readings from Last 1 Encounters:  05/10/2019 5' 11"  (1.803 m)    Weight:   Wt Readings from Last 1 Encounters:  05/23/19 59.2 kg    BMI:  Body mass index is 18.2 kg/m.  Estimated Nutritional Needs:   Kcal:  2100-2300 kcals  Protein:  120-135 g  Fluid:  >/= 1.8 L/day   Kerman Passey MS, RDN, LDN, CNSC RD Pager Number and RD On-Call Pager Number Located in [place]

## 2019-05-24 ENCOUNTER — Inpatient Hospital Stay (HOSPITAL_COMMUNITY): Payer: Medicaid Other

## 2019-05-24 LAB — CBC
HCT: 26.1 % — ABNORMAL LOW (ref 39.0–52.0)
Hemoglobin: 8.1 g/dL — ABNORMAL LOW (ref 13.0–17.0)
MCH: 30.8 pg (ref 26.0–34.0)
MCHC: 31 g/dL (ref 30.0–36.0)
MCV: 99.2 fL (ref 80.0–100.0)
Platelets: 162 10*3/uL (ref 150–400)
RBC: 2.63 MIL/uL — ABNORMAL LOW (ref 4.22–5.81)
RDW: 17 % — ABNORMAL HIGH (ref 11.5–15.5)
WBC: 9.8 10*3/uL (ref 4.0–10.5)
nRBC: 0 % (ref 0.0–0.2)

## 2019-05-24 LAB — RENAL FUNCTION PANEL
Albumin: 1.9 g/dL — ABNORMAL LOW (ref 3.5–5.0)
Albumin: 2.6 g/dL — ABNORMAL LOW (ref 3.5–5.0)
Anion gap: 12 (ref 5–15)
Anion gap: 11 (ref 5–15)
BUN: 53 mg/dL — ABNORMAL HIGH (ref 8–23)
BUN: 39 mg/dL — ABNORMAL HIGH (ref 8–23)
CO2: 24 mmol/L (ref 22–32)
CO2: 23 mmol/L (ref 22–32)
Calcium: 8.4 mg/dL — ABNORMAL LOW (ref 8.9–10.3)
Calcium: 8.3 mg/dL — ABNORMAL LOW (ref 8.9–10.3)
Chloride: 98 mmol/L (ref 98–111)
Chloride: 98 mmol/L (ref 98–111)
Creatinine, Ser: 2.3 mg/dL — ABNORMAL HIGH (ref 0.61–1.24)
Creatinine, Ser: 1.57 mg/dL — ABNORMAL HIGH (ref 0.61–1.24)
GFR calc Af Amer: 34 mL/min — ABNORMAL LOW (ref >60)
GFR calc Af Amer: 54 mL/min — ABNORMAL LOW (ref >60)
GFR calc non Af Amer: 30 mL/min — ABNORMAL LOW (ref >60)
GFR calc non Af Amer: 47 mL/min — ABNORMAL LOW (ref >60)
Glucose, Bld: 95 mg/dL (ref 70–99)
Glucose, Bld: 171 mg/dL — ABNORMAL HIGH (ref 70–99)
Phosphorus: 3.1 mg/dL (ref 2.5–4.6)
Phosphorus: 3.5 mg/dL (ref 2.5–4.6)
Potassium: 5.2 mmol/L — ABNORMAL HIGH (ref 3.5–5.1)
Potassium: 5.5 mmol/L — ABNORMAL HIGH (ref 3.5–5.1)
Sodium: 134 mmol/L — ABNORMAL LOW (ref 135–145)
Sodium: 132 mmol/L — ABNORMAL LOW (ref 135–145)

## 2019-05-24 LAB — COOXEMETRY PANEL
Carboxyhemoglobin: 1.5 % (ref 0.5–1.5)
Methemoglobin: 0.6 % (ref 0.0–1.5)
O2 Saturation: 61.9 %
Total hemoglobin: 8.7 g/dL — ABNORMAL LOW (ref 12.0–16.0)

## 2019-05-24 LAB — GLUCOSE, CAPILLARY
Glucose-Capillary: 108 mg/dL — ABNORMAL HIGH (ref 70–99)
Glucose-Capillary: 122 mg/dL — ABNORMAL HIGH (ref 70–99)
Glucose-Capillary: 123 mg/dL — ABNORMAL HIGH (ref 70–99)
Glucose-Capillary: 151 mg/dL — ABNORMAL HIGH (ref 70–99)
Glucose-Capillary: 158 mg/dL — ABNORMAL HIGH (ref 70–99)
Glucose-Capillary: 227 mg/dL — ABNORMAL HIGH (ref 70–99)
Glucose-Capillary: 51 mg/dL — ABNORMAL LOW (ref 70–99)
Glucose-Capillary: 59 mg/dL — ABNORMAL LOW (ref 70–99)
Glucose-Capillary: 90 mg/dL (ref 70–99)

## 2019-05-24 LAB — POCT I-STAT 7, (LYTES, BLD GAS, ICA,H+H)
Acid-Base Excess: 1 mmol/L (ref 0.0–2.0)
Acid-Base Excess: 0 mmol/L (ref 0.0–2.0)
Bicarbonate: 25.9 mmol/L (ref 20.0–28.0)
Bicarbonate: 25.4 mmol/L (ref 20.0–28.0)
Calcium, Ion: 1.21 mmol/L (ref 1.15–1.40)
Calcium, Ion: 1.18 mmol/L (ref 1.15–1.40)
HCT: 26 % — ABNORMAL LOW (ref 39.0–52.0)
HCT: 25 % — ABNORMAL LOW (ref 39.0–52.0)
Hemoglobin: 8.8 g/dL — ABNORMAL LOW (ref 13.0–17.0)
Hemoglobin: 8.5 g/dL — ABNORMAL LOW (ref 13.0–17.0)
O2 Saturation: 98 %
O2 Saturation: 96 %
Patient temperature: 97.4
Potassium: 5.1 mmol/L (ref 3.5–5.1)
Potassium: 5.1 mmol/L (ref 3.5–5.1)
Sodium: 133 mmol/L — ABNORMAL LOW (ref 135–145)
Sodium: 135 mmol/L (ref 135–145)
TCO2: 27 mmol/L (ref 22–32)
TCO2: 27 mmol/L (ref 22–32)
pCO2 arterial: 40 mmHg (ref 32.0–48.0)
pCO2 arterial: 41.4 mmHg (ref 32.0–48.0)
pH, Arterial: 7.419 (ref 7.350–7.450)
pH, Arterial: 7.392 (ref 7.350–7.450)
pO2, Arterial: 95 mmHg (ref 83.0–108.0)
pO2, Arterial: 81 mmHg — ABNORMAL LOW (ref 83.0–108.0)

## 2019-05-24 LAB — CULTURE, RESPIRATORY W GRAM STAIN

## 2019-05-24 LAB — CORTISOL: Cortisol, Plasma: 54.3 ug/dL

## 2019-05-24 LAB — MAGNESIUM: Magnesium: 2.5 mg/dL — ABNORMAL HIGH (ref 1.7–2.4)

## 2019-05-24 LAB — PROCALCITONIN: Procalcitonin: 1.35 ng/mL

## 2019-05-24 MED ORDER — DEXTROSE 50 % IV SOLN
12.5000 g | INTRAVENOUS | Status: DC | PRN
  Administered 2019-05-29 – 2019-05-30 (×3): 25 g via INTRAVENOUS
  Administered 2019-05-30: 12.5 g via INTRAVENOUS
  Administered 2019-05-30: 25 g via INTRAVENOUS
  Administered 2019-06-05 – 2019-06-06 (×4): 12.5 g via INTRAVENOUS
  Filled 2019-05-24 (×12): qty 50

## 2019-05-24 MED ORDER — MIDODRINE HCL 5 MG PO TABS
20.0000 mg | ORAL_TABLET | Freq: Three times a day (TID) | ORAL | Status: DC
  Administered 2019-05-24 – 2019-06-21 (×84): 20 mg
  Filled 2019-05-24 (×85): qty 4

## 2019-05-24 MED ORDER — DEXTROSE 50 % IV SOLN
25.0000 g | INTRAVENOUS | Status: AC
  Administered 2019-05-24: 25 g via INTRAVENOUS

## 2019-05-24 MED ORDER — SODIUM CHLORIDE 0.9 % IV SOLN
INTRAVENOUS | Status: DC | PRN
  Administered 2019-06-01 – 2019-06-02 (×2): 1000 mL via INTRAVENOUS
  Administered 2019-06-10: 250 mL via INTRAVENOUS

## 2019-05-24 MED ORDER — HYDROCORTISONE NA SUCCINATE PF 100 MG IJ SOLR
50.0000 mg | Freq: Four times a day (QID) | INTRAMUSCULAR | Status: DC
  Administered 2019-05-24 – 2019-05-25 (×4): 50 mg via INTRAVENOUS
  Filled 2019-05-24 (×4): qty 2

## 2019-05-24 MED ORDER — DEXTROSE 50 % IV SOLN
0.0000 mL | INTRAVENOUS | Status: DC | PRN
  Filled 2019-05-24: qty 50

## 2019-05-24 MED ORDER — ALBUMIN HUMAN 25 % IV SOLN
25.0000 g | Freq: Four times a day (QID) | INTRAVENOUS | Status: AC
  Administered 2019-05-24 – 2019-05-26 (×8): 25 g via INTRAVENOUS
  Filled 2019-05-24 (×8): qty 100

## 2019-05-24 NOTE — Progress Notes (Signed)
NAME:  Brendan [name], MRN:  [id number], DOB:  1955/10/25, LOS: 77 ADMISSION DATE:  04/01/2019, CONSULTATION DATE:  04/17/19 REFERRING MD:  Dr. Haroldine Laws, CHIEF COMPLAINT:  Respiratory distress   Brief History   23 yoM originally presented with SOB and fatigue at [facility] found to have new HFrEF, NSTEMI, and bilateral pleural effusions transferred to [facility] on 3/29 for further cardiac evaluation. 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 Started on lasix and milrinone gtts however developed NSVT. Was taken 4/1 for placement of IABP and swan for optimization prior to CABG. Was on precedex for agitation/ confusion, some concern for DTs. On the evening of 4/1, patient developed worsening respiratory distress and hypoxia, PCCM consulted for intubation and vent management.  Patient was extubated on 4/14 and developed worsening hypoxia on BiPAP. Overnight he developed pulseless VT requiring 1 minute of CPR and epinephrine to achieve ROSC. Neurovascularly intact afterward. Subsequently had increased work of breathing and worsening respiratory distress requiring intubation. Patient had tracheostomy performed on 05/01/2019. Trying to wean off ventilator.   Past Medical History  Tobacco abuse, HTN, poorly controlled diabetes, diabetic neuropathy  Significant Hospital Events   3/30 Lt heart cath/TEE performed 4/1 Intubated, RHC/IABP/PA cath 4/2 Impella placement 4/3 febrile over evening and night. Blood cultures obtained. Vanc/cefepime started. Hematuria--heparin held. 4/4 Extubated,abx transitioned to rocephin. Heparin restarted 4/6 Re-intubated for CABG/MVR 4/7 Extubated from CABG to BiPAP 4/9 Impella removed  4/9 TEE 4/10 CVC inserted &CRRT initiated  4/12 PCCMre-consulted re-intubation 4/14 Extubated to BiPAP 4/15 Re-intubated due to respiratory distress 4/16 Tracheostomy 4/27 bronch with BAL 5/7 called back for hypercarbic failure. #6 cuffed placed. Right chest tube placed for enlarging  effusion.  Consults:  TCTS PCCM HF Nephrology  Procedures:  R cephalic double lumen PICC LIJ tunneled HD catheter L brachial arterial line 6-0 cuffed shiley  Significant Diagnostic Tests:  3/30 R/ LHC >>  Ost LM to Mid LM lesion is 35% stenosed.  Prox LAD lesion is 90% stenosed.  Prox LAD to Mid LAD lesion is 50% stenosed.  Mid Cx lesion is 100% stenosed.  Prox RCA to Mid RCA lesion is 90% stenosed.  RPDA lesion is 95% stenosed.  LV end diastolic pressure is severely elevated.  Hemodynamic findings consistent with moderate pulmonary hypertension. 1. Severe 3 vessel obstructive CAD 2. High LV filling pressures 3. Reduced cardiac output with index 2.3 4. Moderate pulmonary HTN.  3/30TTE >> 1. Left ventricular ejection fraction, by estimation, is 30 to 35%. The left ventricle has moderately decreased function. The left ventricle demonstrates regional wall motion abnormalities.There is mild left ventricular hypertrophy. Left ventricular diastolic function could not be evaluated. There is severe hypokinesis of the left ventricular, entire inferior wall, inferoseptal wall, apical segment and lateral wall.  2. Right ventricular systolic function is hyperdynamic. The right ventricular size is normal. There is moderately elevated pulmonary artery systolic pressure.  3. Decreased posterior leaflet motion due to ischemic tethering of the mitral valve leaflets.  4. The mitral valve is abnormal. Moderate to severe mitral valve regurgitation.  5. The aortic valve is tricuspid. Aortic valve regurgitation is not visualized. Mild aortic valve sclerosis is present, with no evidence of aortic valve stenosis.  6. The inferior vena cava is normal in size with greater than 50% respiratory variability, suggesting right atrial pressure of 3 mmHg.   4/1 CT chest w/o >>Large bilateral pleural effusions with associated atelectasis.Moderate to severe bilateral ground-glass opacities,  likely reflecting pneumonia and/or edema.  4/1 RHC >> RA =  18 RV = 60/20  PA = 63/29 (42) PCW = 33 Fick cardiac output/index = 3.7/1.9 PVR = 2.5 WU FA sat = 98% PA sat = 47%, 49% PaPi = 2.2   Micro Data:  5/7 R pleural fluid neg 5/7 blood neg to date 5/6 tracheal aspirate pending  Antimicrobials:    Interim history/subjective:  No events, more awake this AM. Chest tube with 300cc out after 900 initially. Following commands but only intermittently.  Objective   Blood pressure (!) 120/49, pulse 90, temperature 98.9 F (37.2 C), temperature source Oral, resp. rate (!) 30, height _0  (1.803 m), weight 58.2 kg, SpO2 98 %. CVP:  [6 mmHg-11 mmHg] 11 mmHg  Vent Mode: PRVC FiO2 (%):  [40 %] 40 % Set Rate:  [30 bmp] 30 bmp Vt Set:  [560 mL] 560 mL PEEP:  [5 cmH20] 5 cmH20 Plateau Pressure:  [20 cmH20-26 cmH20] 22 cmH20   Intake/Output Summary (Last 24 hours) at 05/24/2019 2440 Last data filed at 05/24/2019 0700 Gross per 24 hour  Intake 1993.28 ml  Output 2913 ml  Net -919.72 ml   Filed Weights   05/22/19 0500 05/23/19 0500 05/24/19 0500  Weight: 55.4 kg 59.2 kg 58.2 kg    Examination:   GEN: Frail elderly man on vent HEENT: Tracheostomy in place, small mucoid secretions CV: Heart sounds regular, extremities are warm PULM: He has severe scattered rhonchi bilaterally, there is a right-sided chest tube with no titling or air leak noted, fluid appears serous GI: Soft, hypoactive bowel sounds, NG tube in place EXT: Profound muscle wasting with superimposed anasarca NEURO: He will move all 4 extremities, intermittently to command PSYCH: RASS +2 SKIN: He has multiple areas of bruising and skin breakdown  CBC benign Chemistries remarkable for albumin 1.9 Cultures negative to date   Assessment & Plan:    Recurrent hypoxemic and hypercarbic respiratory failure after CABG now s/p trach.  Multifactorial.  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   There are superimposed infiltrates for which patient's been started on broad-spectrum antibiotics.  Cultures are pending.  On top of this parenchymal lung change, he has severe protein calorie malnutrition and low oncotic pressure leading to significant third spacing manifested by refractory normal filling pressure pulmonary edema and pleural effusions.  His profound deconditioning and inability to expectorate also limit secretion clearance and lead to recurrent atelectasis.  - PS trials during day, vent at night - Chest tube management per primary, can place pigtail in left pleural space PRN  Shock-again multifactorial.    Distributive related to significant protein calorie malnutrition.  Some component of cardiogenic postop.  Septic less likely but cannot be ruled out, he is appropriately antibiotics for this.  Critical illness vasoplegia is now also starting to play a role.  With low sugars we are also obligated to think about adrenal insufficiency.  - Add on cortisol and Pct - f/u culture data - Standing 25% albumin x 2 days - Hydrocortisone - Increase midodrine  Best practice:  Diet: Tube feeds Pain/Anxiety/Delirium protocol (if indicated): per primary VAP protocol (if indicated):HOB 30 degrees  DVT prophylaxis: SCDs for now resumption of pharmacologic per primary GI prophylaxis: PPI Glucose control:SSI Mobility: BR Code Status: Full  Family Communication:per primary team Disposition:ICU  The patient is critically ill with multiple organ systems failure and requires high complexity decision making for assessment and support, frequent evaluation and titration of therapies, application of advanced monitoring technologies and extensive interpretation of  multiple databases. Critical Care Time devoted to patient care services described in this note independent of APP/resident time (if applicable)  is 43 minutes.   Erskine Emery MD [facility] 05/24/2019 8:48  AM Personal pager: [phone] If unanswered, please page CCM On-call: [phone]

## 2019-05-24 NOTE — Progress Notes (Signed)
Patient ID: Brendan [name], male   DOB: [birth year], 64 y.o.   MRN: [id number]     Advanced Heart Failure Rounding Note  PCP-Cardiologist: No primary care provider on file.   Subjective:    Chest CT yesterday with diffuse bilateral infiltrates c/w ARDS and large bilateral effusions R>L. R chest tube placed with > 1L out  Back on vent and broad spectrum abx. Getting albumin. NE up to 10. Remains on CRRT keeping even.   Awake and alert today. Following commands. Asking for pain meds.    Objective:   Weight Range: 58.2 kg Body mass index is 17.9 kg/m.   Vital Signs:   Temp:  [94.5 F (34.7 C)-98.9 F (37.2 C)] 98.9 F (37.2 C) (05/08 0812) Pulse Rate:  [73-300] 90 (05/08 0700) Resp:  [11-30] 30 (05/08 0748) BP: (80-120)/(49-77) 120/49 (05/07 1517) SpO2:  [86 %-100 %] 99 % (05/08 1127) Arterial Line BP: (89-128)/(37-57) 107/51 (05/08 0700) FiO2 (%):  [40 %] 40 % (05/08 1127) Weight:  [58.2 kg] 58.2 kg (05/08 0500) Last BM Date: 05/24/19  Weight change: Filed Weights   05/22/19 0500 05/23/19 0500 05/24/19 0500  Weight: 55.4 kg 59.2 kg 58.2 kg    Intake/Output:   Intake/Output Summary (Last 24 hours) at 05/24/2019 1140 Last data filed at 05/24/2019 0700 Gross per 24 hour  Intake 1772.58 ml  Output 2913 ml  Net -1140.42 ml      Physical Exam   General:  Cachetic. Ill-appearing. No resp difficulty HEENT: normal + cor trak Neck: supple. JVP 7-8. + trach Carotids 2+ bilat; no bruits. No lymphadenopathy or thryomegaly appreciated. Cor: PMI nondisplaced. Regular rate & rhythm. No rubs, gallops or murmurs. Lungs rhonchi left chest tunneled cath  Abdomen: soft, nontender, nondistended. No hepatosplenomegaly. No bruits or masses. Good bowel sounds. Extremities: no cyanosis, clubbing, rash, 1+ edema Neuro: alert & orientedx3, cranial nerves grossly intact. moves all 4 extremities w/o difficulty.   Telemetry  NSR 80-90s personally reviewed.   Labs    CBC Recent Labs      05/22/19 0402 05/22/19 0827 05/24/19 0529 05/24/19 0539  WBC 10.0  --   --  9.8  HGB 8.6*   < > 8.8* 8.1*  HCT 28.3*   < > 26.0* 26.1*  MCV 100.7*  --   --  99.2  PLT 144*  --   --  162   < > = values in this interval not displayed.   Basic Metabolic Panel Recent Labs    05/23/19 0333 05/23/19 0534 05/23/19 1624 05/23/19 1658 05/24/19 0529 05/24/19 0539  NA 131*   < > 131*   < > 133* 134*  K 4.2   < > 4.9   < > 5.1 5.2*  CL 95*   < > 93*  --   --  98  CO2 26   < > 23  --   --  24  GLUCOSE 165*   < > 114*  --   --  95  BUN 74*   < > 91*  --   --  53*  CREATININE 3.44*   < > 4.06*  --   --  2.30*  CALCIUM 8.7*   < > 8.9  --   --  8.4*  MG 2.5*  --   --   --   --  2.5*  PHOS 5.6*   < > 3.7  --   --  3.1   < > = values in this interval  not displayed.   Liver Function Tests Recent Labs    05/23/19 1624 05/24/19 0539  ALBUMIN 1.9* 1.9*   No results for input(s): LIPASE, AMYLASE in the last 72 hours. Cardiac Enzymes No results for input(s): CKTOTAL, CKMB, CKMBINDEX, TROPONINI in the last 72 hours.  BNP: BNP (last 3 results) Recent Labs    03/26/2019 0113  BNP 655.7*    ProBNP (last 3 results) No results for input(s): PROBNP in the last 8760 hours.   D-Dimer No results for input(s): DDIMER in the last 72 hours. Hemoglobin A1C No results for input(s): HGBA1C in the last 72 hours. Fasting Lipid Panel No results for input(s): CHOL, HDL, LDLCALC, TRIG, CHOLHDL, LDLDIRECT in the last 72 hours. Thyroid Function Tests No results for input(s): TSH, T4TOTAL, T3FREE, THYROIDAB in the last 72 hours.  Invalid input(s): FREET3  Other results:   Imaging    DG Abd 1 View  Result Date: 05/24/2019 CLINICAL DATA:  Enteric tube placement EXAM: ABDOMEN - 1 VIEW COMPARISON:  None. FINDINGS: Enteric tube tip is in the stomach. There is moderate stool in the colon. There is no bowel dilatation or air-fluid level to suggest bowel obstruction. No free air. There are  surgical clips in the right upper quadrant. IMPRESSION: Enteric tube tip in stomach. No bowel obstruction or free air. Moderate stool in colon. Electronically Signed   By: Lowella Grip III M.D.   On: 05/24/2019 08:13   CT CHEST WO CONTRAST  Result Date: 05/23/2019 CLINICAL DATA:  Respiratory failure. EXAM: CT CHEST WITHOUT CONTRAST TECHNIQUE: Multidetector CT imaging of the chest was performed following the standard protocol without IV contrast. COMPARISON:  04/19/2019 chest CT and chest x-ray 05/23/2019 FINDINGS: Cardiovascular: The heart is normal in size. Small pericardial effusion. Stable age advanced three-vessel coronary artery calcifications. The right PICC line and left subclavian central venous catheters are stable. There is an NG tube coursing down the esophagus and into the stomach. The tracheostomy tube is in good position. Mediastinum/Nodes: Small scattered mediastinal and hilar lymph nodes are stable and likely inflammatory/reactive. Lungs/Pleura: Large bilateral pleural effusions with overlying atelectasis. Extensive bilateral infiltrates in both lungs. No obvious pulmonary lesions. Upper Abdomen: No significant upper abdominal findings. Musculoskeletal: No chest wall mass, supraclavicular or axillary adenopathy. Stable surgical changes in the right supraclavicular region laterally. IMPRESSION: 1. Large bilateral pleural effusions with overlying atelectasis. 2. Extensive bilateral infiltrates in both lungs. 3. Stable age advanced three-vessel coronary artery calcifications. 4. Support apparatus in good position without complicating features. 5. Aortic atherosclerosis. Aortic Atherosclerosis (ICD10-I70.0). Aortic Atherosclerosis (ICD10-I70.0). Electronically Signed   By: Marijo Sanes M.D.   On: 05/23/2019 13:26   DG CHEST PORT 1 VIEW  Result Date: 05/24/2019 CLINICAL DATA:  Respiratory failure/hypoxia EXAM: PORTABLE CHEST 1 VIEW COMPARISON:  May 23, 2019 FINDINGS: Tracheostomy catheter tip  is 6.9 cm above the carina. Left central catheter tip is in the right atrium. Right central catheter tip is in the superior vena cava. Feeding tube tip is below the diaphragm. There is a chest tube on the right. There is no demonstrable pneumothorax. Areas of interstitial and alveolar opacity, presumably edema, remain without appreciable change from 1 day prior. Cardiomegaly with pulmonary venous hypertension appear stable. Status post coronary artery bypass grafting and mitral valve replacement. No evident adenopathy. Surgical clips in the right axillary region remain. IMPRESSION: Essentially stable appearance compared to 1 day prior. Tube and catheter positions as described without evident pneumothorax. Interstitial and alveolar opacity present, likely representing edema. A  degree of superimposed pneumonia cannot be excluded. Stable cardiac prominence. Electronically Signed   By: Lowella Grip III M.D.   On: 05/24/2019 08:12   DG Chest Port 1 View  Result Date: 05/23/2019 CLINICAL DATA:  Chest tube placement. EXAM: PORTABLE CHEST 1 VIEW COMPARISON:  05/23/2019 FINDINGS: Tracheostomy tube in satisfactory position. Feeding tube coursing below the diaphragm. Dual lumen left-sided central venous catheter with the tip projecting over the cavoatrial junction. Right-sided chest tube directed towards the apex. No pneumothorax. Trace bilateral pleural effusions. Bilateral diffuse interstitial and alveolar airspace opacities somewhat improved compared with x-ray performed earlier same day. Stable cardiac mediastinal silhouette. Prior CABG. No acute osseous abnormality. IMPRESSION: 1. Right-sided chest tube directed towards the apex. No pneumothorax. 2. Bilateral diffuse interstitial and alveolar airspace opacities somewhat improved compared with x-ray performed earlier same day. Differential considerations include pulmonary edema versus less likely multilobar pneumonia given the interval improvement within the same  day. Electronically Signed   By: Kathreen Devoid   On: 05/23/2019 17:01     Medications:     Scheduled Medications: . aspirin EC  325 mg Oral Daily   Or  . aspirin  324 mg Per Tube Daily  . B-complex with vitamin C  1 tablet Per Tube Daily  . chlorhexidine gluconate (MEDLINE KIT)  15 mL Mouth Rinse BID  . Chlorhexidine Gluconate Cloth  6 each Topical Q0600  . citalopram  10 mg Per Tube Daily  . docusate  200 mg Per Tube Daily  . feeding supplement (PRO-STAT SUGAR FREE 64)  30 mL Oral BID  . hydrocortisone sod succinate (SOLU-CORTEF) inj  50 mg Intravenous Q6H  . insulin aspart  1 Units Subcutaneous Q4H  . insulin glargine  14 Units Subcutaneous BID  . mouth rinse  15 mL Mouth Rinse 10 times per day  . metoCLOPramide (REGLAN) injection  5 mg Intravenous Q6H  . midodrine  20 mg Per Tube Q8H  . neomycin-polymyxin-hydrocortisone  3 drop Left EAR Q12H  . pantoprazole sodium  40 mg Per Tube BID  . sevelamer carbonate  0.8 g Per Tube TID WC  . sodium chloride flush  10-40 mL Intracatheter Q12H    Infusions: .  prismasol BGK 4/2.5 500 mL/hr at 05/24/19 1043  .  prismasol BGK 4/2.5 300 mL/hr at 05/23/19 1625  . sodium chloride 10 mL/hr at 05/24/19 0700  . albumin human 25 g (05/24/19 1038)  . ceFEPime (MAXIPIME) IV 2 g (05/23/19 2209)  . feeding supplement (VITAL 1.5 CAL) 1,000 mL (05/23/19 1424)  . lactated ringers Stopped (04/21/2019 1640)  . norepinephrine (LEVOPHED) Adult infusion 10 mcg/min (05/24/19 0700)  . prismasol BGK 4/2.5 1,500 mL/hr at 05/24/19 0804  . vancomycin      PRN Medications: Place/Maintain arterial line **AND** sodium chloride, alum & mag hydroxide-simeth, dextrose, diphenhydrAMINE, fentaNYL (SUBLIMAZE) injection, Gerhardt's butt cream, heparin, heparin, levalbuterol, ondansetron (ZOFRAN) IV, oxyCODONE, [START ON 05/26/2019] pneumococcal 23 valent vaccine, sodium chloride flush     Assessment/Plan   1. Acute systolic HF ->  Cardiogenic Shock  - Due to iCM.  EF 20-25% - Impella 5.5 placed on 4/2.   - s/p CABG/MVRepair on 4/6  - Impella out 4/9.  - Echo 4/10 with EF 25-30%, normal RV, no MR.  - Echo 4/22 EF 30-35% mildly reduced RV MVR ok   - Norepi increased to 10 mcg this morning.  - CO-OX stable at 62% - Continue midodrine 20 mg tid.   2. CAD - LHC with severe  3 vessel CAD  - s/p CABG x 3 MV Repair 4/6. Echo stable - No s/s ischemia - On ASA/Crestor  3. Acute hypoxic respiratory failure - Due to pulmonary edema and PNA - Extubated 4/9 re-intubated on 4/12. Failed re-extubation on 4/14. Now s/p tracheostomy - Completed antibiotic course.  - S/P Bronch 4/27 - 60K yeast. -> ? Colonization.  - CT scan 5/7 with diffuse bilateral infiltrates c/w ARDS and large bilateral effusions R>L. R chest tube placed with > 1L out - He is very weak. Vent dependent -  Continue volume control through HD. Keeping even today - D/w CCM. Can tapl left effusion as needed  4. Mitral Regurgitation - Mod-severe on ECHO - s/p MV repair, TEE 4/9 with minimal MR s/p repair.   - Echo 4/10 with no significant MR.  - echo 4/22 MVR stable  5. Uncontrolled DM - Hgb A1C 9.  - On insulin and sliding scale.  - No change  6. AKI - due to shock - Remains anuric post-op - Continue CVVHD per Nephrology  7. Acute blood loss Anemia  - Hgb stable 8.1 - No obvious bleeding.  - transfuse hgb < 7.5   8. Polymorphic VT - Recurrent VT during respiratory distress 4/14, - Off amio  - No further VT.  - Keep K> 4.0 Mg > 2.0  - no change  9. Severe Malnutrition - Prealbumin improved, up from 8.9>>18.7 - Nutrition on board - Continue w/ TFs - Wil likely need PEG  10. Atrial fibrillation. paroxysmal - Remain in NSR  - Amiodarone was stopped.   29. Debility, severe - as above will likely need LTAC on d/c  12. Chronic pain issues - pain meds being tapered  13. Hyponatremia - Na 134 otday  He remains very weak and critically ill. Unable to liberate from  vent or pressors. Can only tolerate CVVHD. Will need to discuss [place] with him and family.   CRITICAL CARE Performed by: Glori Bickers  Total critical care time: 35 minutes  Critical care time was exclusive of separately billable procedures and treating other patients.  Critical care was necessary to treat or prevent imminent or life-threatening deterioration.  Critical care was time spent personally by me (independent of midlevel providers or residents) on the following activities: development of treatment plan with patient and/or surrogate as well as nursing, discussions with consultants, evaluation of patient's response to treatment, examination of patient, obtaining history from patient or surrogate, ordering and performing treatments and interventions, ordering and review of laboratory studies, ordering and review of radiographic studies, pulse oximetry and re-evaluation of patient's condition.    Glori Bickers, MD  05/24/2019 11:40 AM

## 2019-05-24 NOTE — Plan of Care (Signed)
  Problem: Clinical Measurements: Goal: Ability to maintain clinical measurements within normal limits will improve Outcome: Progressing Goal: Will remain free from infection Outcome: Progressing Goal: Diagnostic test results will improve Outcome: Progressing Goal: Respiratory complications will improve Outcome: Not Progressing Goal: Cardiovascular complication will be avoided Outcome: Progressing   Problem: Activity: Goal: Risk for activity intolerance will decrease Outcome: Not Progressing   Problem: Nutrition: Goal: Adequate nutrition will be maintained Outcome: Progressing   Problem: Coping: Goal: Level of anxiety will decrease Outcome: Not Progressing   Problem: Elimination: Goal: Will not experience complications related to bowel motility Outcome: Not Progressing Goal: Will not experience complications related to urinary retention Outcome: Not Applicable   Problem: Pain Managment: Goal: General experience of comfort will improve Outcome: Progressing   Problem: Safety: Goal: Ability to remain free from injury will improve Outcome: Progressing   Problem: Skin Integrity: Goal: Risk for impaired skin integrity will decrease Outcome: Progressing   Problem: Activity: Goal: Risk for activity intolerance will decrease Outcome: Not Progressing   Problem: Cardiac: Goal: Will achieve and/or maintain hemodynamic stability Outcome: Progressing   Problem: Clinical Measurements: Goal: Postoperative complications will be avoided or minimized Outcome: Progressing   Problem: Respiratory: Goal: Respiratory status will improve Outcome: Not Progressing   Problem: Skin Integrity: Goal: Wound healing without signs and symptoms of infection Outcome: Progressing Goal: Risk for impaired skin integrity will decrease Outcome: Progressing   Problem: Urinary Elimination: Goal: Ability to achieve and maintain adequate renal perfusion and functioning will improve Outcome: Not  Applicable

## 2019-05-24 NOTE — Progress Notes (Signed)
Patient ID: Brendan [name], male   DOB: Sep 28, 1955, 64 y.o.   MRN: [id number] S: CT scan revealed worsening bilateral pulmonary infiltrates, bilateral pleural effusions R>L s/p chest tube with 1 liter drainage overnight.  More awake and alert today. O:BP (!) 120/49   Pulse 90   Temp 98.9 F (37.2 C) (Oral)   Resp (!) 30   Ht 5' 11"  (1.803 m) Comment: measured x 3  Wt 58.2 kg   SpO2 98%   BMI 17.90 kg/m   Intake/Output Summary (Last 24 hours) at 05/24/2019 1010 Last data filed at 05/24/2019 0700 Gross per 24 hour  Intake 1878.52 ml  Output 2913 ml  Net -1034.48 ml   Intake/Output: I/O last 3 completed shifts: In: 2811.1 [I.V.:315.1; VO/ZD:6644; IV Piggyback:550.1] Out: 2913 [Other:1703; Chest Tube:1210]  Intake/Output this shift:  No intake/output data recorded. Weight change: -1 kg Gen: Cachectic WM on ventilator via trach CVS: RRR Resp: decreased BS at bases Abd: +BS, soft, NT/ND Ext: 1+ ankle edema bilaterally  Recent Labs  Lab 05/19/19 0340 05/19/19 0340 05/19/19 1130 05/19/19 1130 05/20/19 0322 05/20/19 0322 05/21/19 0359 05/21/19 0359 05/22/19 0402 05/22/19 0827 05/23/19 0347 05/23/19 0534 05/23/19 0750 05/23/19 1624 05/23/19 1658 05/24/19 0529 05/24/19 0539  NA 132*   < > 134*   < > 131*   < > 130*   < > 132*   < > 131* 130* 130* 131* 130* 133* 134*  K 6.4*   < > 3.9   < > 4.7   < > 5.4*   < > 4.2   < > 4.2 4.2 4.8 4.9 4.6 5.1 5.2*  CL 90*   < > 95*  --  92*  --  88*  --  93*  --  95*  --   --  93*  --   --  98  CO2 26  --   --   --  26  --  26  --  27  --  26  --   --  23  --   --  24  GLUCOSE 250*   < > 150*  --  177*  --  227*  --  134*  --  165*  --   --  114*  --   --  95  BUN 117*   < > 35*  --  61*  --  100*  --  50*  --  74*  --   --  91*  --   --  53*  CREATININE 3.77*   < > 1.40*  --  2.57*  --  3.54*  --  2.29*  --  3.44*  --   --  4.06*  --   --  2.30*  ALBUMIN 2.1*  --   --   --  2.4*  --  2.2*  --  2.3*  --  2.1*  --   --  1.9*  --   --  1.9*   CALCIUM 8.2*  --   --   --  8.4*  --  8.7*  --  8.6*  --  8.7*  --   --  8.9  --   --  8.4*  PHOS >30.0*  --   --   --  6.7*  --  7.4*  --  5.1*  --  5.6*  --   --  3.7  --   --  3.1   < > = values in this interval not displayed.  Liver Function Tests: Recent Labs  Lab 05/23/19 0333 05/23/19 1624 05/24/19 0539  ALBUMIN 2.1* 1.9* 1.9*   No results for input(s): LIPASE, AMYLASE in the last 168 hours. No results for input(s): AMMONIA in the last 168 hours. CBC: Recent Labs  Lab 05/19/19 0340 05/19/19 1130 05/20/19 0322 05/20/19 0322 05/21/19 0359 05/21/19 0359 05/22/19 0402 05/22/19 0827 05/23/19 1658 05/24/19 0529 05/24/19 0539  WBC 14.1*  --  9.6   < > 9.6  --  10.0  --   --   --  9.8  HGB 9.7*   < > 9.3*   < > 9.0*   < > 8.6*   < > 8.5* 8.8* 8.1*  HCT 31.7*   < > 30.0*   < > 29.1*   < > 28.3*   < > 25.0* 26.0* 26.1*  MCV 100.3*  --  100.3*  --  99.0  --  100.7*  --   --   --  99.2  PLT 138*  --  125*   < > 142*  --  144*  --   --   --  162   < > = values in this interval not displayed.   Cardiac Enzymes: No results for input(s): CKTOTAL, CKMB, CKMBINDEX, TROPONINI in the last 168 hours. CBG: Recent Labs  Lab 05/24/19 0007 05/24/19 0406 05/24/19 0814 05/24/19 0817 05/24/19 0907  GLUCAP 123* 108* 51* 59* 122*    Iron Studies: No results for input(s): IRON, TIBC, TRANSFERRIN, FERRITIN in the last 72 hours. Studies/Results: DG Abd 1 View  Result Date: 05/24/2019 CLINICAL DATA:  Enteric tube placement EXAM: ABDOMEN - 1 VIEW COMPARISON:  None. FINDINGS: Enteric tube tip is in the stomach. There is moderate stool in the colon. There is no bowel dilatation or air-fluid level to suggest bowel obstruction. No free air. There are surgical clips in the right upper quadrant. IMPRESSION: Enteric tube tip in stomach. No bowel obstruction or free air. Moderate stool in colon. Electronically Signed   By: Lowella Grip III M.D.   On: 05/24/2019 08:13   CT CHEST WO  CONTRAST  Result Date: 05/23/2019 CLINICAL DATA:  Respiratory failure. EXAM: CT CHEST WITHOUT CONTRAST TECHNIQUE: Multidetector CT imaging of the chest was performed following the standard protocol without IV contrast. COMPARISON:  04/24/2019 chest CT and chest x-ray 05/23/2019 FINDINGS: Cardiovascular: The heart is normal in size. Small pericardial effusion. Stable age advanced three-vessel coronary artery calcifications. The right PICC line and left subclavian central venous catheters are stable. There is an NG tube coursing down the esophagus and into the stomach. The tracheostomy tube is in good position. Mediastinum/Nodes: Small scattered mediastinal and hilar lymph nodes are stable and likely inflammatory/reactive. Lungs/Pleura: Large bilateral pleural effusions with overlying atelectasis. Extensive bilateral infiltrates in both lungs. No obvious pulmonary lesions. Upper Abdomen: No significant upper abdominal findings. Musculoskeletal: No chest wall mass, supraclavicular or axillary adenopathy. Stable surgical changes in the right supraclavicular region laterally. IMPRESSION: 1. Large bilateral pleural effusions with overlying atelectasis. 2. Extensive bilateral infiltrates in both lungs. 3. Stable age advanced three-vessel coronary artery calcifications. 4. Support apparatus in good position without complicating features. 5. Aortic atherosclerosis. Aortic Atherosclerosis (ICD10-I70.0). Aortic Atherosclerosis (ICD10-I70.0). Electronically Signed   By: Marijo Sanes M.D.   On: 05/23/2019 13:26   DG CHEST PORT 1 VIEW  Result Date: 05/24/2019 CLINICAL DATA:  Respiratory failure/hypoxia EXAM: PORTABLE CHEST 1 VIEW COMPARISON:  May 23, 2019 FINDINGS: Tracheostomy catheter tip is 6.9 cm above the carina.  Left central catheter tip is in the right atrium. Right central catheter tip is in the superior vena cava. Feeding tube tip is below the diaphragm. There is a chest tube on the right. There is no demonstrable  pneumothorax. Areas of interstitial and alveolar opacity, presumably edema, remain without appreciable change from 1 day prior. Cardiomegaly with pulmonary venous hypertension appear stable. Status post coronary artery bypass grafting and mitral valve replacement. No evident adenopathy. Surgical clips in the right axillary region remain. IMPRESSION: Essentially stable appearance compared to 1 day prior. Tube and catheter positions as described without evident pneumothorax. Interstitial and alveolar opacity present, likely representing edema. A degree of superimposed pneumonia cannot be excluded. Stable cardiac prominence. Electronically Signed   By: Lowella Grip III M.D.   On: 05/24/2019 08:12   DG Chest Port 1 View  Result Date: 05/23/2019 CLINICAL DATA:  Chest tube placement. EXAM: PORTABLE CHEST 1 VIEW COMPARISON:  05/23/2019 FINDINGS: Tracheostomy tube in satisfactory position. Feeding tube coursing below the diaphragm. Dual lumen left-sided central venous catheter with the tip projecting over the cavoatrial junction. Right-sided chest tube directed towards the apex. No pneumothorax. Trace bilateral pleural effusions. Bilateral diffuse interstitial and alveolar airspace opacities somewhat improved compared with x-ray performed earlier same day. Stable cardiac mediastinal silhouette. Prior CABG. No acute osseous abnormality. IMPRESSION: 1. Right-sided chest tube directed towards the apex. No pneumothorax. 2. Bilateral diffuse interstitial and alveolar airspace opacities somewhat improved compared with x-ray performed earlier same day. Differential considerations include pulmonary edema versus less likely multilobar pneumonia given the interval improvement within the same day. Electronically Signed   By: Kathreen Devoid   On: 05/23/2019 17:01   DG Chest Port 1 View  Result Date: 05/23/2019 CLINICAL DATA:  Shortness of breath EXAM: PORTABLE CHEST 1 VIEW COMPARISON:  May 22, 2019. FINDINGS: Tracheostomy  catheter tip is 5.0 cm above the carina. Left central catheter tip is in the right atrium, slightly beyond the cavoatrial junction. Right central catheter tip is at the cavoatrial junction. No pneumothorax. There is widespread interstitial and alveolar edema, similar to 1 day prior. There is cardiomegaly with pulmonary venous hypertension. Patient is status post coronary artery bypass grafting and mitral valve replacement. No adenopathy evident. There are surgical clips in the right axillary region. IMPRESSION: Tube and catheter positions as described without pneumothorax. Widespread interstitial and alveolar edema. A degree of superimposed pneumonia cannot be excluded radiographically. Stable cardiac silhouette with evidence of underlying pulmonary venous hypertension. Electronically Signed   By: Lowella Grip III M.D.   On: 05/23/2019 08:21   Portable Chest x-ray  Result Date: 05/23/2019 CLINICAL DATA:  Changed tracheostomy tube. EXAM: PORTABLE CHEST 1 VIEW COMPARISON:  Prior study same day.  05/22/2019. FINDINGS: Tracheostomy tube noted good anatomic position. Feeding tube tip below left hemidiaphragm. Large caliber left central line in unchanged position with tip over right atrium. Right PICC line noted with tip in unchanged position at cavoatrial junction position. Prior CABG and cardiac valve replacement. Cardiomegaly again noted. Diffuse severe bilateral pulmonary infiltrates/edema unchanged. No prominent pleural effusion. No pneumothorax. Surgical clips right chest. IMPRESSION: 1. Tracheostomy tube appears to be in good anatomic position. Remaining lines and tubes in unchanged position. 2. Prior CABG. Prior cardiac valve replacement. Cardiomegaly again noted. 3. Diffuse severe bilateral pulmonary infiltrates/edema again noted without interim change. Electronically Signed   By: Marcello Moores  Register   On: 05/23/2019 07:15   . aspirin EC  325 mg Oral Daily   Or  . aspirin  324 mg Per Tube Daily  .  B-complex with vitamin C  1 tablet Per Tube Daily  . chlorhexidine gluconate (MEDLINE KIT)  15 mL Mouth Rinse BID  . Chlorhexidine Gluconate Cloth  6 each Topical Q0600  . citalopram  10 mg Per Tube Daily  . docusate  200 mg Per Tube Daily  . feeding supplement (PRO-STAT SUGAR FREE 64)  30 mL Oral BID  . hydrocortisone sod succinate (SOLU-CORTEF) inj  50 mg Intravenous Q6H  . insulin aspart  1 Units Subcutaneous Q4H  . insulin glargine  14 Units Subcutaneous BID  . mouth rinse  15 mL Mouth Rinse 10 times per day  . metoCLOPramide (REGLAN) injection  5 mg Intravenous Q6H  . midodrine  20 mg Per Tube Q8H  . neomycin-polymyxin-hydrocortisone  3 drop Left EAR Q12H  . pantoprazole sodium  40 mg Per Tube BID  . sevelamer carbonate  0.8 g Per Tube TID WC  . sodium chloride flush  10-40 mL Intracatheter Q12H    BMET    Component Value Date/Time   NA 134 (L) 05/24/2019 0539   K 5.2 (H) 05/24/2019 0539   CL 98 05/24/2019 0539   CO2 24 05/24/2019 0539   GLUCOSE 95 05/24/2019 0539   BUN 53 (H) 05/24/2019 0539   CREATININE 2.30 (H) 05/24/2019 0539   CALCIUM 8.4 (L) 05/24/2019 0539   GFRNONAA 30 (L) 05/24/2019 0539   GFRAA 34 (L) 05/24/2019 0539   CBC    Component Value Date/Time   WBC 9.8 05/24/2019 0539   RBC 2.63 (L) 05/24/2019 0539   HGB 8.1 (L) 05/24/2019 0539   HCT 26.1 (L) 05/24/2019 0539   PLT 162 05/24/2019 0539   MCV 99.2 05/24/2019 0539   MCH 30.8 05/24/2019 0539   MCHC 31.0 05/24/2019 0539   RDW 17.0 (H) 05/24/2019 0539   LYMPHSABS 1.1 05/14/2019 0402   MONOABS 1.1 (H) 04/24/2019 0402   EOSABS 0.2 05/03/2019 0402   BASOSABS 0.0 04/19/2019 0402     Brief HPI: admitted to outside hospital on 3/26 with SOB, new low EF and NSTEMI txd to [facility] 3/29: 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 Admission Scr was 0.99.  Assessment/Plan:  1. Acute hypoxic  respiratory failure on 05/23/19- worsening infiltrates on CT and bilateral pleural effusions.  S/p right chest tube with over 1 liter of drainage.   2. Oliguric/anuricAKI- CRRT started on 4/10 and taken off 4/29. Transitioned to IHD on 05/17/19 and today is on his second session but with low bp and requiring increase in levophed. 1. Remains oliguric/anuric. 2. Resumed CRRT on 05/23/19 following acute worsening of his oxygenation and hypotension.   3. Will keep even in light of increased pressor support and chest tube with drainage of pleural effusion on the right.  Still with pleural effusion on the left.  Pleural effusions likely due to hypoalbuminemia. 4. Dialysate 4K/2.5 Ca for replacement fluids and dialysate.  Will continue with these for now 5. Potassium elevated likely due to being off of CRRT for CT scan 6. Seen on CRRT and instructed RN to keep even for now but if he improves can pull 50 ml/hr later today. 3. Vascular access- leftIJ tunneled HD catheter placed by IR on 05/19/19 4. CAD- 3 vessel with EF 25% s/p CABG and MVR 08/23/48 complicated by cardiogenic shock 5. Acute systolic CHF and cardiogenic shock- still on pressors with  escalating requirements. Volume improved with CRRT/IHD 6. Acute hypoxic respiratory failure- s/p trach back on vent.  Pulmonary infiltrates worsening on CXR.  Discussed with PCCM and plan for CT scan of chest and likely repeat bronch. 7. DM- per primary 8. MR s/p MVR 9. Polymorphic VT- off amio 10. Severe debility- will need LTACbut needs to remain off of pressors.  Donetta Potts, MD [employer] [phone]

## 2019-05-24 NOTE — Progress Notes (Signed)
Patient ID: Brendan [name], male   DOB: 04/03/1955, 64 y.o.   MRN: [id number] TCTS DAILY ICU PROGRESS NOTE                   [address]            [place] [address]          [phone]   22 Days Post-Op Procedure(s) (LRB): VIDEO BRONCHOSCOPY USING DISPOSABLE ANESTHESIA SCOPE (N/A) TRACHEOSTOMY (N/A)  DATE OF PROCEDURE:  04/30/2019 OPERATIONS: 1.  Coronary artery bypass grafting x3 (left internal mammary artery to left anterior descending, saphenous vein graft to posterior descending, saphenous vein graft to second diagonal). 2.  Mitral valve annuloplasty using a 26 mm Edwards Physio II annuloplasty ring, serial [id number]. 3.  Endoscopic harvest of left leg greater saphenous vein.    Total Length of Stay:  LOS: 40 days     Subjective: Patient opens eyes moves all extremities intermittently to command, now back on ventilator and increased doses of Levophed  Objective: Vital signs in last 24 hours: Temp:  [94.5 F (34.7 C)-99.1 F (37.3 C)] 98.4 F (36.9 C) (05/08 0400) Pulse Rate:  [73-300] 90 (05/08 0700) Cardiac Rhythm: Normal sinus rhythm (05/08 0400) Resp:  [11-32] 30 (05/08 0748) BP: (80-125)/(49-77) 120/49 (05/07 1517) SpO2:  [86 %-100 %] 98 % (05/08 0748) Arterial Line BP: (89-128)/(37-57) 107/51 (05/08 0700) FiO2 (%):  [40 %] 40 % (05/08 0748) Weight:  [58.2 kg] 58.2 kg (05/08 0500)  Filed Weights   05/22/19 0500 05/23/19 0500 05/24/19 0500  Weight: 55.4 kg 59.2 kg 58.2 kg    Weight change: -1 kg   Hemodynamic parameters for last 24 hours: CVP:  [6 mmHg-11 mmHg] 11 mmHg  Intake/Output from previous day: 05/07 0701 - 05/08 0700 In: 2108.2 [I.V.:262.2; JJ/KK:9381; IV Piggyback:450.1] Out: 2913 [Chest Tube:1210]  Intake/Output this shift: No intake/output data recorded.  Current Meds: Scheduled Meds: . aspirin EC  325 mg Oral Daily   Or  . aspirin  324 mg Per Tube Daily  . B-complex with vitamin C  1 tablet Per Tube Daily  .  chlorhexidine      . chlorhexidine gluconate (MEDLINE KIT)  15 mL Mouth Rinse BID  . Chlorhexidine Gluconate Cloth  6 each Topical Q0600  . citalopram  10 mg Per Tube Daily  . docusate  200 mg Per Tube Daily  . feeding supplement (PRO-STAT SUGAR FREE 64)  30 mL Oral BID  . insulin aspart  1 Units Subcutaneous Q4H  . insulin glargine  14 Units Subcutaneous BID  . mouth rinse  15 mL Mouth Rinse 10 times per day  . metoCLOPramide (REGLAN) injection  5 mg Intravenous Q6H  . midodrine  15 mg Per Tube Q8H  . neomycin-polymyxin-hydrocortisone  3 drop Left EAR Q12H  . pantoprazole sodium  40 mg Per Tube BID  . sevelamer carbonate  0.8 g Per Tube TID WC  . sodium chloride flush  10-40 mL Intracatheter Q12H   Continuous Infusions: .  prismasol BGK 4/2.5 500 mL/hr at 05/24/19 0241  .  prismasol BGK 4/2.5 300 mL/hr at 05/23/19 1625  . sodium chloride 10 mL/hr at 05/24/19 0700  . ceFEPime (MAXIPIME) IV 2 g (05/23/19 2209)  . feeding supplement (VITAL 1.5 CAL) 1,000 mL (05/23/19 1424)  . lactated ringers Stopped (04/29/2019 1640)  . norepinephrine (LEVOPHED) Adult infusion 10 mcg/min (05/24/19 0700)  . prismasol BGK 4/2.5 1,500 mL/hr at 05/24/19 0804  . vancomycin  PRN Meds:.Place/Maintain arterial line **AND** sodium chloride, alum & mag hydroxide-simeth, diphenhydrAMINE, fentaNYL (SUBLIMAZE) injection, Brianny Soulliere's butt cream, heparin, heparin, levalbuterol, ondansetron (ZOFRAN) IV, oxyCODONE, [START ON 05/26/2019] pneumococcal 23 valent vaccine, sodium chloride flush  General appearance: alert, cachectic and delirious Neurologic: Confused, not consistently following commands Lungs: diminished breath sounds bibasilar Extremities: extremities normal, atraumatic, no cyanosis or edema Wound: Sternum intact  Lab Results: CBC: Recent Labs    05/22/19 0402 05/22/19 0827 05/24/19 0529 05/24/19 0539  WBC 10.0  --   --  9.8  HGB 8.6*   < > 8.8* 8.1*  HCT 28.3*   < > 26.0* 26.1*  PLT 144*  --    --  162   < > = values in this interval not displayed.   BMET:  Recent Labs    05/23/19 1624 05/23/19 1658 05/24/19 0529 05/24/19 0539  NA 131*   < > 133* 134*  K 4.9   < > 5.1 5.2*  CL 93*  --   --  98  CO2 23  --   --  24  GLUCOSE 114*  --   --  95  BUN 91*  --   --  53*  CREATININE 4.06*  --   --  2.30*  CALCIUM 8.9  --   --  8.4*   < > = values in this interval not displayed.    CMET: Lab Results  Component Value Date   WBC 9.8 05/24/2019   HGB 8.1 (L) 05/24/2019   HCT 26.1 (L) 05/24/2019   PLT 162 05/24/2019   GLUCOSE 95 05/24/2019   CHOL 174 08/21/2017   TRIG 143 08/21/2017   HDL 57 08/21/2017   LDLCALC 88 08/21/2017   ALT 56 (H) 05/15/2019   AST 44 (H) 05/15/2019   NA 134 (L) 05/24/2019   K 5.2 (H) 05/24/2019   CL 98 05/24/2019   CREATININE 2.30 (H) 05/24/2019   BUN 53 (H) 05/24/2019   CO2 24 05/24/2019   TSH 0.925 03/30/2019   INR 1.6 (H) 05/01/2019   HGBA1C 9.0 (H) 03/31/2019   MICROALBUR 30 08/21/2017      PT/INR: No results for input(s): LABPROT, INR in the last 72 hours. Radiology: CT CHEST WO CONTRAST  Result Date: 05/23/2019 CLINICAL DATA:  Respiratory failure. EXAM: CT CHEST WITHOUT CONTRAST TECHNIQUE: Multidetector CT imaging of the chest was performed following the standard protocol without IV contrast. COMPARISON:  04/20/2019 chest CT and chest x-ray 05/23/2019 FINDINGS: Cardiovascular: The heart is normal in size. Small pericardial effusion. Stable age advanced three-vessel coronary artery calcifications. The right PICC line and left subclavian central venous catheters are stable. There is an NG tube coursing down the esophagus and into the stomach. The tracheostomy tube is in good position. Mediastinum/Nodes: Small scattered mediastinal and hilar lymph nodes are stable and likely inflammatory/reactive. Lungs/Pleura: Large bilateral pleural effusions with overlying atelectasis. Extensive bilateral infiltrates in both lungs. No obvious pulmonary  lesions. Upper Abdomen: No significant upper abdominal findings. Musculoskeletal: No chest wall mass, supraclavicular or axillary adenopathy. Stable surgical changes in the right supraclavicular region laterally. IMPRESSION: 1. Large bilateral pleural effusions with overlying atelectasis. 2. Extensive bilateral infiltrates in both lungs. 3. Stable age advanced three-vessel coronary artery calcifications. 4. Support apparatus in good position without complicating features. 5. Aortic atherosclerosis. Aortic Atherosclerosis (ICD10-I70.0). Aortic Atherosclerosis (ICD10-I70.0). Electronically Signed   By: Marijo Sanes M.D.   On: 05/23/2019 13:26   DG Chest Port 1 View  Result Date: 05/23/2019 CLINICAL DATA:  Chest tube placement. EXAM: PORTABLE CHEST 1 VIEW COMPARISON:  05/23/2019 FINDINGS: Tracheostomy tube in satisfactory position. Feeding tube coursing below the diaphragm. Dual lumen left-sided central venous catheter with the tip projecting over the cavoatrial junction. Right-sided chest tube directed towards the apex. No pneumothorax. Trace bilateral pleural effusions. Bilateral diffuse interstitial and alveolar airspace opacities somewhat improved compared with x-ray performed earlier same day. Stable cardiac mediastinal silhouette. Prior CABG. No acute osseous abnormality. IMPRESSION: 1. Right-sided chest tube directed towards the apex. No pneumothorax. 2. Bilateral diffuse interstitial and alveolar airspace opacities somewhat improved compared with x-ray performed earlier same day. Differential considerations include pulmonary edema versus less likely multilobar pneumonia given the interval improvement within the same day. Electronically Signed   By: Kathreen Devoid   On: 05/23/2019 17:01     Assessment/Plan: S/P Procedure(s) (LRB): VIDEO BRONCHOSCOPY USING DISPOSABLE ANESTHESIA SCOPE (N/A) TRACHEOSTOMY (N/A) Patient now 1 month postop, now vent dependent, pressor dependent, on cvvh Continue current  supportive care    Grace Isaac 05/24/2019 8:08 AM

## 2019-05-24 NOTE — Progress Notes (Signed)
[facility] Progress Note Patient Name: Brendan [name] DOB: 30-Jan-1955 MRN: [id number]   Date of Service  05/24/2019  HPI/Events of Note  Asked to review KUB for OG tube  eICU Interventions  Ok to use      Intervention Category Intermediate Interventions: Other:  Margaretmary Lombard 05/24/2019, 6:10 AM

## 2019-05-25 LAB — CBC
HCT: 23.6 % — ABNORMAL LOW (ref 39.0–52.0)
Hemoglobin: 7.1 g/dL — ABNORMAL LOW (ref 13.0–17.0)
MCH: 30.1 pg (ref 26.0–34.0)
MCHC: 30.1 g/dL (ref 30.0–36.0)
MCV: 100 fL (ref 80.0–100.0)
Platelets: 157 10*3/uL (ref 150–400)
RBC: 2.36 MIL/uL — ABNORMAL LOW (ref 4.22–5.81)
RDW: 17 % — ABNORMAL HIGH (ref 11.5–15.5)
WBC: 8.5 10*3/uL (ref 4.0–10.5)
nRBC: 0 % (ref 0.0–0.2)

## 2019-05-25 LAB — RENAL FUNCTION PANEL
Albumin: 3.2 g/dL — ABNORMAL LOW (ref 3.5–5.0)
Albumin: 3.3 g/dL — ABNORMAL LOW (ref 3.5–5.0)
Anion gap: 11 (ref 5–15)
Anion gap: 10 (ref 5–15)
BUN: 35 mg/dL — ABNORMAL HIGH (ref 8–23)
BUN: 30 mg/dL — ABNORMAL HIGH (ref 8–23)
CO2: 25 mmol/L (ref 22–32)
CO2: 25 mmol/L (ref 22–32)
Calcium: 8.9 mg/dL (ref 8.9–10.3)
Calcium: 8.9 mg/dL (ref 8.9–10.3)
Chloride: 99 mmol/L (ref 98–111)
Chloride: 101 mmol/L (ref 98–111)
Creatinine, Ser: 1.36 mg/dL — ABNORMAL HIGH (ref 0.61–1.24)
Creatinine, Ser: 1.2 mg/dL (ref 0.61–1.24)
GFR calc Af Amer: >60 mL/min (ref >60)
GFR calc Af Amer: >60 mL/min (ref >60)
GFR calc non Af Amer: 56 mL/min — ABNORMAL LOW (ref >60)
GFR calc non Af Amer: >60 mL/min (ref >60)
Glucose, Bld: 213 mg/dL — ABNORMAL HIGH (ref 70–99)
Glucose, Bld: 180 mg/dL — ABNORMAL HIGH (ref 70–99)
Phosphorus: 3.6 mg/dL (ref 2.5–4.6)
Phosphorus: 2.8 mg/dL (ref 2.5–4.6)
Potassium: 5.3 mmol/L — ABNORMAL HIGH (ref 3.5–5.1)
Potassium: 5 mmol/L (ref 3.5–5.1)
Sodium: 135 mmol/L (ref 135–145)
Sodium: 136 mmol/L (ref 135–145)

## 2019-05-25 LAB — GLUCOSE, CAPILLARY
Glucose-Capillary: 202 mg/dL — ABNORMAL HIGH (ref 70–99)
Glucose-Capillary: 166 mg/dL — ABNORMAL HIGH (ref 70–99)
Glucose-Capillary: 189 mg/dL — ABNORMAL HIGH (ref 70–99)
Glucose-Capillary: 163 mg/dL — ABNORMAL HIGH (ref 70–99)
Glucose-Capillary: 89 mg/dL (ref 70–99)

## 2019-05-25 LAB — POCT I-STAT 7, (LYTES, BLD GAS, ICA,H+H)
Acid-Base Excess: 1 mmol/L (ref 0.0–2.0)
Acid-Base Excess: 2 mmol/L (ref 0.0–2.0)
Bicarbonate: 26 mmol/L (ref 20.0–28.0)
Bicarbonate: 26 mmol/L (ref 20.0–28.0)
Calcium, Ion: 1.2 mmol/L (ref 1.15–1.40)
Calcium, Ion: 1.22 mmol/L (ref 1.15–1.40)
HCT: 23 % — ABNORMAL LOW (ref 39.0–52.0)
HCT: 28 % — ABNORMAL LOW (ref 39.0–52.0)
Hemoglobin: 7.8 g/dL — ABNORMAL LOW (ref 13.0–17.0)
Hemoglobin: 9.5 g/dL — ABNORMAL LOW (ref 13.0–17.0)
O2 Saturation: 96 %
O2 Saturation: 94 %
Patient temperature: 97
Patient temperature: 97.8
Potassium: 5.3 mmol/L — ABNORMAL HIGH (ref 3.5–5.1)
Potassium: 4.9 mmol/L (ref 3.5–5.1)
Sodium: 135 mmol/L (ref 135–145)
Sodium: 136 mmol/L (ref 135–145)
TCO2: 27 mmol/L (ref 22–32)
TCO2: 27 mmol/L (ref 22–32)
pCO2 arterial: 41.2 mmHg (ref 32.0–48.0)
pCO2 arterial: 38.3 mmHg (ref 32.0–48.0)
pH, Arterial: 7.405 (ref 7.350–7.450)
pH, Arterial: 7.437 (ref 7.350–7.450)
pO2, Arterial: 75 mmHg — ABNORMAL LOW (ref 83.0–108.0)
pO2, Arterial: 66 mmHg — ABNORMAL LOW (ref 83.0–108.0)

## 2019-05-25 LAB — COOXEMETRY PANEL
Carboxyhemoglobin: 0.9 % (ref 0.5–1.5)
Carboxyhemoglobin: 1.2 % (ref 0.5–1.5)
Carboxyhemoglobin: 1.5 % (ref 0.5–1.5)
Carboxyhemoglobin: 2.1 % — ABNORMAL HIGH (ref 0.5–1.5)
Methemoglobin: 0.6 % (ref 0.0–1.5)
Methemoglobin: 1.2 % (ref 0.0–1.5)
Methemoglobin: 0.9 % (ref 0.0–1.5)
Methemoglobin: 1.2 % (ref 0.0–1.5)
O2 Saturation: 32.4 %
O2 Saturation: 29.7 %
O2 Saturation: 29 %
O2 Saturation: 51.7 %
Total hemoglobin: 11 g/dL — ABNORMAL LOW (ref 12.0–16.0)
Total hemoglobin: 10.9 g/dL — ABNORMAL LOW (ref 12.0–16.0)
Total hemoglobin: 8.7 g/dL — ABNORMAL LOW (ref 12.0–16.0)
Total hemoglobin: 8.4 g/dL — ABNORMAL LOW (ref 12.0–16.0)

## 2019-05-25 LAB — MAGNESIUM: Magnesium: 2.7 mg/dL — ABNORMAL HIGH (ref 1.7–2.4)

## 2019-05-25 LAB — PREPARE RBC (CROSSMATCH)

## 2019-05-25 MED ORDER — MELATONIN 3 MG PO TABS: 3.0000 mg | ORAL_TABLET | Freq: Every day | ORAL | Status: DC

## 2019-05-25 MED ORDER — SODIUM CHLORIDE 0.9% IV SOLUTION: Freq: Once | INTRAVENOUS | Status: AC

## 2019-05-25 MED ORDER — INSULIN GLARGINE 100 UNIT/ML ~~LOC~~ SOLN
12.0000 [IU] | Freq: Two times a day (BID) | SUBCUTANEOUS | Status: DC
  Administered 2019-05-25 (×2): 12 [IU] via SUBCUTANEOUS
  Filled 2019-05-25 (×4): qty 0.12

## 2019-05-25 MED ORDER — SORBITOL 70 % SOLN
30.0000 mL | Freq: Every day | Status: DC | PRN
  Administered 2019-05-31 – 2019-06-04 (×2): 30 mL
  Filled 2019-05-25 (×2): qty 30

## 2019-05-25 MED ORDER — SORBITOL 70 % SOLN
30.0000 mL | Freq: Once | Status: AC
  Administered 2019-05-25: 30 mL via ORAL
  Filled 2019-05-25: qty 30

## 2019-05-25 MED ORDER — QUETIAPINE FUMARATE 25 MG PO TABS
25.0000 mg | ORAL_TABLET | Freq: Two times a day (BID) | ORAL | Status: DC
  Administered 2019-05-25: 25 mg via ORAL

## 2019-05-25 MED ORDER — INSULIN GLARGINE 100 UNIT/ML ~~LOC~~ SOLN
18.0000 [IU] | Freq: Two times a day (BID) | SUBCUTANEOUS | Status: DC
  Filled 2019-05-25 (×2): qty 0.18

## 2019-05-25 MED ORDER — GABAPENTIN 250 MG/5ML PO SOLN
100.0000 mg | Freq: Three times a day (TID) | ORAL | Status: DC
  Administered 2019-05-25 – 2019-06-05 (×33): 100 mg via ORAL
  Filled 2019-05-25 (×35): qty 2

## 2019-05-25 MED ORDER — PRO-STAT SUGAR FREE PO LIQD
30.0000 mL | Freq: Two times a day (BID) | ORAL | Status: DC
  Administered 2019-05-25 – 2019-06-20 (×53): 30 mL
  Filled 2019-05-25 (×53): qty 30

## 2019-05-25 MED ORDER — MELATONIN 3 MG PO TABS
3.0000 mg | ORAL_TABLET | Freq: Every day | ORAL | Status: DC
  Administered 2019-05-25 – 2019-06-20 (×27): 3 mg
  Filled 2019-05-25 (×27): qty 1

## 2019-05-25 MED ORDER — QUETIAPINE FUMARATE 25 MG PO TABS
25.0000 mg | ORAL_TABLET | Freq: Two times a day (BID) | ORAL | Status: DC
  Administered 2019-05-25: 25 mg
  Filled 2019-05-25 (×2): qty 1

## 2019-05-25 NOTE — Progress Notes (Signed)
Patient ID: Brendan [name], male   DOB: 10-Oct-1955, 64 y.o.   MRN: [id number] TCTS DAILY ICU PROGRESS NOTE                   [address]            [place] [address]          [phone]   23 Days Post-Op Procedure(s) (LRB): VIDEO BRONCHOSCOPY USING DISPOSABLE ANESTHESIA SCOPE (N/A) TRACHEOSTOMY (N/A)  DATE OF PROCEDURE:  05/09/2019 OPERATIONS: 1.  Coronary artery bypass grafting x3 (left internal mammary artery to left anterior descending, saphenous vein graft to posterior descending, saphenous vein graft to second diagonal). 2.  Mitral valve annuloplasty using a 26 mm Edwards Physio II annuloplasty ring, serial [id number]. 3.  Endoscopic harvest of left leg greater saphenous vein.    Total Length of Stay:  LOS: 41 days     Subjective: reamins on vent , neuro intact    Objective: Vital signs in last 24 hours: Temp:  [96.3 F (35.7 C)-97.6 F (36.4 C)] 96.3 F (35.7 C) (05/09 0815) Pulse Rate:  [66-134] 71 (05/09 0730) Cardiac Rhythm: Normal sinus rhythm (05/08 2000) Resp:  [9-27] 18 (05/09 0730) BP: (105-122)/(44-67) 110/61 (05/09 0000) SpO2:  [76 %-100 %] 97 % (05/09 0742) Arterial Line BP: (91-143)/(39-64) 124/49 (05/09 0730) FiO2 (%):  [40 %] 40 % (05/09 0742) Weight:  [60.1 kg] 60.1 kg (05/09 0545)  Filed Weights   05/23/19 0500 05/24/19 0500 05/25/19 0545  Weight: 59.2 kg 58.2 kg 60.1 kg    Weight change: 1.9 kg   Hemodynamic parameters for last 24 hours: CVP:  [8 mmHg-22 mmHg] 13 mmHg  Intake/Output from previous day: 05/08 0701 - 05/09 0700 In: 2629.4 [I.V.:214.8; NG/GT:1680; IV Piggyback:734.6] Out: 2993 [Chest Tube:490]  Intake/Output this shift: No intake/output data recorded.  Current Meds: Scheduled Meds: . sodium chloride   Intravenous Once  . aspirin EC  325 mg Oral Daily   Or  . aspirin  324 mg Per Tube Daily  . B-complex with vitamin C  1 tablet Per Tube Daily  . chlorhexidine gluconate (MEDLINE KIT)  15 mL Mouth  Rinse BID  . Chlorhexidine Gluconate Cloth  6 each Topical Q0600  . citalopram  10 mg Per Tube Daily  . docusate  200 mg Per Tube Daily  . feeding supplement (PRO-STAT SUGAR FREE 64)  30 mL Oral BID  . insulin aspart  1 Units Subcutaneous Q4H  . insulin glargine  12 Units Subcutaneous BID  . mouth rinse  15 mL Mouth Rinse 10 times per day  . melatonin  3 mg Oral QHS  . midodrine  20 mg Per Tube Q8H  . neomycin-polymyxin-hydrocortisone  3 drop Left EAR Q12H  . pantoprazole sodium  40 mg Per Tube BID  . QUEtiapine  25 mg Oral BID  . sevelamer carbonate  0.8 g Per Tube TID WC  . sodium chloride flush  10-40 mL Intracatheter Q12H   Continuous Infusions: .  prismasol BGK 4/2.5 500 mL/hr at 05/25/19 0707  .  prismasol BGK 4/2.5 300 mL/hr at 05/24/19 2046  . sodium chloride Stopped (05/24/19 2324)  . sodium chloride    . albumin human Stopped (05/25/19 0440)  . ceFEPime (MAXIPIME) IV Stopped (05/24/19 2230)  . feeding supplement (VITAL 1.5 CAL) 1,000 mL (05/25/19 0636)  . norepinephrine (LEVOPHED) Adult infusion 4 mcg/min (05/25/19 0700)  . prismasol BGK 4/2.5 1,500 mL/hr at 05/25/19 0204  . vancomycin Stopped (  05/24/19 1733)   PRN Meds:.Place/Maintain arterial line **AND** sodium chloride, sodium chloride, alum & mag hydroxide-simeth, dextrose, diphenhydrAMINE, fentaNYL (SUBLIMAZE) injection, Lavante Toso's butt cream, heparin, heparin, levalbuterol, ondansetron (ZOFRAN) IV, oxyCODONE, [START ON 05/26/2019] pneumococcal 23 valent vaccine, sodium chloride flush, sorbitol  General appearance: alert, cachectic and delirious Neurologic: Confused, not consistently following commands Lungs: diminished breath sounds bibasilar Extremities: extremities normal, atraumatic, no cyanosis or edema Wound: Sternum intact  Lab Results: CBC: Recent Labs    05/24/19 0539 05/24/19 1849 05/25/19 0400 05/25/19 0437  WBC 9.8  --  8.5  --   HGB 8.1*   < > 7.1* 7.8*  HCT 26.1*   < > 23.6* 23.0*  PLT 162   --  157  --    < > = values in this interval not displayed.   BMET:  Recent Labs    05/24/19 1957 05/24/19 1957 05/25/19 0400 05/25/19 0437  NA 132*   < > 135 135  K 5.5*   < > 5.3* 5.3*  CL 98  --  99  --   CO2 23  --  25  --   GLUCOSE 171*  --  213*  --   BUN 39*  --  35*  --   CREATININE 1.57*  --  1.36*  --   CALCIUM 8.3*  --  8.9  --    < > = values in this interval not displayed.    CMET: Lab Results  Component Value Date   WBC 8.5 05/25/2019   HGB 7.8 (L) 05/25/2019   HCT 23.0 (L) 05/25/2019   PLT 157 05/25/2019   GLUCOSE 213 (H) 05/25/2019   CHOL 174 08/21/2017   TRIG 143 08/21/2017   HDL 57 08/21/2017   LDLCALC 88 08/21/2017   ALT 56 (H) 05/15/2019   AST 44 (H) 05/15/2019   NA 135 05/25/2019   K 5.3 (H) 05/25/2019   CL 99 05/25/2019   CREATININE 1.36 (H) 05/25/2019   BUN 35 (H) 05/25/2019   CO2 25 05/25/2019   TSH 0.925 04/01/2019   INR 1.6 (H) 05/01/2019   HGBA1C 9.0 (H) 03/19/2019   MICROALBUR 30 08/21/2017      PT/INR: No results for input(s): LABPROT, INR in the last 72 hours. Radiology: No results found.   Assessment/Plan: S/P Procedure(s) (LRB): VIDEO BRONCHOSCOPY USING DISPOSABLE ANESTHESIA SCOPE (N/A) TRACHEOSTOMY (N/A) Patient now 1 month postop, now vent dependent, pressor dependent, on cvvh Continue current supportive care To get blood this am     Grace Isaac 05/25/2019 8:45 AMPatient ID: Sunday Corn, male   DOB: December 26, 1955, 64 y.o.   MRN: [id number]

## 2019-05-25 NOTE — Progress Notes (Signed)
Patient ID: Brendan [name], male   DOB: 23-Jun-1955, 64 y.o.   MRN: [id number] S: Hgb dropping and is to receive PRBC's this morning. O:BP (!) 124/57   Pulse 98   Temp (!) 97.3 F (36.3 C) (Oral)   Resp (!) 26   Ht 5' 11"  (1.803 m) Comment: measured x 3  Wt 60.1 kg   SpO2 94%   BMI 18.48 kg/m   Intake/Output Summary (Last 24 hours) at 05/25/2019 1022 Last data filed at 05/25/2019 0906 Gross per 24 hour  Intake 2621.89 ml  Output 3018 ml  Net -396.11 ml   Intake/Output: I/O last 3 completed shifts: In: 3733.2 [I.V.:398.6; NG/GT:2500; IV Piggyback:834.6] Out: 3009 [Other:3800; Chest Tube:790]  Intake/Output this shift:  Total I/O In: 209.7 [I.V.:7.5; NG/GT:190; IV Piggyback:12.2] Out: 288 [Other:238; Chest Tube:50] Weight change: 1.9 kg Gen: frail, cachectic WM on vent via trach CVS: RRR Resp: decreased BS at bases Abd: +BS, soft, NT/ND Ext: 1+ ankle edema  Recent Labs  Lab 05/21/19 0359 05/21/19 0359 05/22/19 0402 05/22/19 0827 05/23/19 0333 05/23/19 0534 05/23/19 1624 05/23/19 1624 05/23/19 1658 05/24/19 0529 05/24/19 0539 05/24/19 1849 05/24/19 1957 05/25/19 0400 05/25/19 0437  NA 130*   < > 132*   < > 131*   < > 131*   < > 130* 133* 134* 135 132* 135 135  K 5.4*   < > 4.2   < > 4.2   < > 4.9   < > 4.6 5.1 5.2* 5.1 5.5* 5.3* 5.3*  CL 88*  --  93*  --  95*  --  93*  --   --   --  98  --  98 99  --   CO2 26  --  27  --  26  --  23  --   --   --  24  --  23 25  --   GLUCOSE 227*  --  134*  --  165*  --  114*  --   --   --  95  --  171* 213*  --   BUN 100*  --  50*  --  74*  --  91*  --   --   --  53*  --  39* 35*  --   CREATININE 3.54*  --  2.29*  --  3.44*  --  4.06*  --   --   --  2.30*  --  1.57* 1.36*  --   ALBUMIN 2.2*  --  2.3*  --  2.1*  --  1.9*  --   --   --  1.9*  --  2.6* 3.2*  --   CALCIUM 8.7*  --  8.6*  --  8.7*  --  8.9  --   --   --  8.4*  --  8.3* 8.9  --   PHOS 7.4*  --  5.1*  --  5.6*  --  3.7  --   --   --  3.1  --  3.5 3.6  --    < > =  values in this interval not displayed.   Liver Function Tests: Recent Labs  Lab 05/24/19 0539 05/24/19 1957 05/25/19 0400  ALBUMIN 1.9* 2.6* 3.2*   No results for input(s): LIPASE, AMYLASE in the last 168 hours. No results for input(s): AMMONIA in the last 168 hours. CBC: Recent Labs  Lab 05/20/19 0322 05/20/19 2330 05/21/19 0762 05/21/19 2633 05/22/19 0402 05/22/19 0827 05/24/19 3545 05/24/19 6256  05/24/19 1849 05/25/19 0400 05/25/19 0437  WBC 9.6   < > 9.6   < > 10.0  --  9.8  --   --  8.5  --   HGB 9.3*   < > 9.0*   < > 8.6*   < > 8.1*   < > 8.5* 7.1* 7.8*  HCT 30.0*   < > 29.1*   < > 28.3*   < > 26.1*   < > 25.0* 23.6* 23.0*  MCV 100.3*  --  99.0  --  100.7*  --  99.2  --   --  100.0  --   PLT 125*   < > 142*   < > 144*  --  162  --   --  157  --    < > = values in this interval not displayed.   Cardiac Enzymes: No results for input(s): CKTOTAL, CKMB, CKMBINDEX, TROPONINI in the last 168 hours. CBG: Recent Labs  Lab 05/24/19 1606 05/24/19 2007 05/24/19 2345 05/25/19 0404 05/25/19 0819  GLUCAP 151* 158* 227* 202* 166*    Iron Studies: No results for input(s): IRON, TIBC, TRANSFERRIN, FERRITIN in the last 72 hours. Studies/Results: DG Abd 1 View  Result Date: 05/24/2019 CLINICAL DATA:  Enteric tube placement EXAM: ABDOMEN - 1 VIEW COMPARISON:  None. FINDINGS: Enteric tube tip is in the stomach. There is moderate stool in the colon. There is no bowel dilatation or air-fluid level to suggest bowel obstruction. No free air. There are surgical clips in the right upper quadrant. IMPRESSION: Enteric tube tip in stomach. No bowel obstruction or free air. Moderate stool in colon. Electronically Signed   By: Lowella Grip III M.D.   On: 05/24/2019 08:13   CT CHEST WO CONTRAST  Result Date: 05/23/2019 CLINICAL DATA:  Respiratory failure. EXAM: CT CHEST WITHOUT CONTRAST TECHNIQUE: Multidetector CT imaging of the chest was performed following the standard protocol  without IV contrast. COMPARISON:  04/20/2019 chest CT and chest x-ray 05/23/2019 FINDINGS: Cardiovascular: The heart is normal in size. Small pericardial effusion. Stable age advanced three-vessel coronary artery calcifications. The right PICC line and left subclavian central venous catheters are stable. There is an NG tube coursing down the esophagus and into the stomach. The tracheostomy tube is in good position. Mediastinum/Nodes: Small scattered mediastinal and hilar lymph nodes are stable and likely inflammatory/reactive. Lungs/Pleura: Large bilateral pleural effusions with overlying atelectasis. Extensive bilateral infiltrates in both lungs. No obvious pulmonary lesions. Upper Abdomen: No significant upper abdominal findings. Musculoskeletal: No chest wall mass, supraclavicular or axillary adenopathy. Stable surgical changes in the right supraclavicular region laterally. IMPRESSION: 1. Large bilateral pleural effusions with overlying atelectasis. 2. Extensive bilateral infiltrates in both lungs. 3. Stable age advanced three-vessel coronary artery calcifications. 4. Support apparatus in good position without complicating features. 5. Aortic atherosclerosis. Aortic Atherosclerosis (ICD10-I70.0). Aortic Atherosclerosis (ICD10-I70.0). Electronically Signed   By: Marijo Sanes M.D.   On: 05/23/2019 13:26   DG CHEST PORT 1 VIEW  Result Date: 05/24/2019 CLINICAL DATA:  Respiratory failure/hypoxia EXAM: PORTABLE CHEST 1 VIEW COMPARISON:  May 23, 2019 FINDINGS: Tracheostomy catheter tip is 6.9 cm above the carina. Left central catheter tip is in the right atrium. Right central catheter tip is in the superior vena cava. Feeding tube tip is below the diaphragm. There is a chest tube on the right. There is no demonstrable pneumothorax. Areas of interstitial and alveolar opacity, presumably edema, remain without appreciable change from 1 day prior. Cardiomegaly with pulmonary venous hypertension  appear stable. Status  post coronary artery bypass grafting and mitral valve replacement. No evident adenopathy. Surgical clips in the right axillary region remain. IMPRESSION: Essentially stable appearance compared to 1 day prior. Tube and catheter positions as described without evident pneumothorax. Interstitial and alveolar opacity present, likely representing edema. A degree of superimposed pneumonia cannot be excluded. Stable cardiac prominence. Electronically Signed   By: Lowella Grip III M.D.   On: 05/24/2019 08:12   DG Chest Port 1 View  Result Date: 05/23/2019 CLINICAL DATA:  Chest tube placement. EXAM: PORTABLE CHEST 1 VIEW COMPARISON:  05/23/2019 FINDINGS: Tracheostomy tube in satisfactory position. Feeding tube coursing below the diaphragm. Dual lumen left-sided central venous catheter with the tip projecting over the cavoatrial junction. Right-sided chest tube directed towards the apex. No pneumothorax. Trace bilateral pleural effusions. Bilateral diffuse interstitial and alveolar airspace opacities somewhat improved compared with x-ray performed earlier same day. Stable cardiac mediastinal silhouette. Prior CABG. No acute osseous abnormality. IMPRESSION: 1. Right-sided chest tube directed towards the apex. No pneumothorax. 2. Bilateral diffuse interstitial and alveolar airspace opacities somewhat improved compared with x-ray performed earlier same day. Differential considerations include pulmonary edema versus less likely multilobar pneumonia given the interval improvement within the same day. Electronically Signed   By: Kathreen Devoid   On: 05/23/2019 17:01   . sodium chloride   Intravenous Once  . aspirin EC  325 mg Oral Daily   Or  . aspirin  324 mg Per Tube Daily  . B-complex with vitamin C  1 tablet Per Tube Daily  . chlorhexidine gluconate (MEDLINE KIT)  15 mL Mouth Rinse BID  . Chlorhexidine Gluconate Cloth  6 each Topical Q0600  . citalopram  10 mg Per Tube Daily  . docusate  200 mg Per Tube Daily  .  feeding supplement (PRO-STAT SUGAR FREE 64)  30 mL Oral BID  . insulin aspart  1 Units Subcutaneous Q4H  . insulin glargine  12 Units Subcutaneous BID  . mouth rinse  15 mL Mouth Rinse 10 times per day  . melatonin  3 mg Oral QHS  . midodrine  20 mg Per Tube Q8H  . neomycin-polymyxin-hydrocortisone  3 drop Left EAR Q12H  . pantoprazole sodium  40 mg Per Tube BID  . QUEtiapine  25 mg Oral BID  . sevelamer carbonate  0.8 g Per Tube TID WC  . sodium chloride flush  10-40 mL Intracatheter Q12H    BMET    Component Value Date/Time   NA 135 05/25/2019 0437   K 5.3 (H) 05/25/2019 0437   CL 99 05/25/2019 0400   CO2 25 05/25/2019 0400   GLUCOSE 213 (H) 05/25/2019 0400   BUN 35 (H) 05/25/2019 0400   CREATININE 1.36 (H) 05/25/2019 0400   CALCIUM 8.9 05/25/2019 0400   GFRNONAA 56 (L) 05/25/2019 0400   GFRAA >60 05/25/2019 0400   CBC    Component Value Date/Time   WBC 8.5 05/25/2019 0400   RBC 2.36 (L) 05/25/2019 0400   HGB 7.8 (L) 05/25/2019 0437   HCT 23.0 (L) 05/25/2019 0437   PLT 157 05/25/2019 0400   MCV 100.0 05/25/2019 0400   MCH 30.1 05/25/2019 0400   MCHC 30.1 05/25/2019 0400   RDW 17.0 (H) 05/25/2019 0400   LYMPHSABS 1.1 04/30/2019 0402   MONOABS 1.1 (H) 04/21/2019 0402   EOSABS 0.2 05/08/2019 0402   BASOSABS 0.0 04/20/2019 0402    Brief HPI: admitted to outside hospital on 3/26 with SOB, new low EF  and NSTEMI txd to [facility] 3/29: 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 Admission Scr was 0.99.  Assessment/Plan:  1. Acute hypoxic respiratory failure on 05/23/19- worsening infiltrates on CT and bilateral pleural effusions.  S/p right chest tube with over 1 liter of drainage.   2. Oliguric/anuricAKI- CRRT started on 4/10 and taken off 4/29. Transitioned to IHD on 05/17/19 and today is on his second session but with low bp and requiring increase in  levophed. 1. Remains oliguric/anuric. 2. Resumed CRRT on 05/23/19 following acute worsening of his oxygenation and hypotension.   3. Will keep even in light of increased pressor support and chest tube with drainage of pleural effusion on the right.  Still with pleural effusion on the left.  Pleural effusions likely due to hypoalbuminemia. 4. Dialysate 4K/2.5 Ca for replacement fluids and dialysate.  Will continue with these for now 5. Potassium elevated likely due to being off of CRRT for CT scan.  If still elevated will need to change replacement fluids to 2K/2.5Ca 3. Seen on CRRT and instructed RN to keep even for now but if he improves can pull 50 ml/hr later today.  4. Vascular access- leftIJ tunneled HD catheter placed by IR on 05/19/19 5. CAD- 3 vessel with EF 25% s/p CABG and MVR 09/22/86 complicated by cardiogenic shock 6. Acute systolic CHF and cardiogenic shock- still on pressorswith escalating requirements. Volume improved with CRRT/IHD 7. Acute hypoxic respiratory failure- s/p trachback on vent. Pulmonary infiltrates worsening on CXR. Discussed with PCCM and plan for CT scan of chest and likely repeat bronch. 8. DM- per primary 9. 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 Severe debility- will need LTACbut needs to remain off of pressors.  Donetta Potts, MD [employer] [phone]

## 2019-05-25 NOTE — Progress Notes (Signed)
NAME:  Brendan [name], MRN:  [id number], DOB:  March 24, 1955, LOS: 53 ADMISSION DATE:  04/04/2019, CONSULTATION DATE:  04/17/19 REFERRING MD:  Dr. Haroldine Laws, CHIEF COMPLAINT:  Respiratory distress   Brief History   84 yoM originally presented with SOB and fatigue at [facility] found to have new HFrEF, NSTEMI, and bilateral pleural effusions transferred to [facility] on 3/29 for further cardiac evaluation. 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 Started on lasix and milrinone gtts however developed NSVT. Was taken 4/1 for placement of IABP and swan for optimization prior to CABG. Was on precedex for agitation/ confusion, some concern for DTs. On the evening of 4/1, patient developed worsening respiratory distress and hypoxia, PCCM consulted for intubation and vent management.  Patient was extubated on 4/14 and developed worsening hypoxia on BiPAP. Overnight he developed pulseless VT requiring 1 minute of CPR and epinephrine to achieve ROSC. Neurovascularly intact afterward. Subsequently had increased work of breathing and worsening respiratory distress requiring intubation. Patient had tracheostomy performed on 05/12/2019. Trying to wean off ventilator.   Past Medical History  Tobacco abuse, HTN, poorly controlled diabetes, diabetic neuropathy  Significant Hospital Events   3/30 Lt heart cath/TEE performed 4/1 Intubated, RHC/IABP/PA cath 4/2 Impella placement 4/3 febrile over evening and night. Blood cultures obtained. Vanc/cefepime started. Hematuria--heparin held. 4/4 Extubated,abx transitioned to rocephin. Heparin restarted 4/6 Re-intubated for CABG/MVR 4/7 Extubated from CABG to BiPAP 4/9 Impella removed  4/9 TEE 4/10 CVC inserted &CRRT initiated  4/12 PCCMre-consulted re-intubation 4/14 Extubated to BiPAP 4/15 Re-intubated due to respiratory distress 4/16 Tracheostomy 4/27 bronch with BAL 5/7 called back for hypercarbic failure. #6 cuffed placed. Right chest tube placed for enlarging  effusion.  Consults:  TCTS PCCM HF Nephrology  Procedures:  R cephalic double lumen PICC LIJ tunneled HD catheter L brachial arterial line 6-0 cuffed shiley  Significant Diagnostic Tests:  3/30 R/ LHC >>  Ost LM to Mid LM lesion is 35% stenosed.  Prox LAD lesion is 90% stenosed.  Prox LAD to Mid LAD lesion is 50% stenosed.  Mid Cx lesion is 100% stenosed.  Prox RCA to Mid RCA lesion is 90% stenosed.  RPDA lesion is 95% stenosed.  LV end diastolic pressure is severely elevated.  Hemodynamic findings consistent with moderate pulmonary hypertension. 1. Severe 3 vessel obstructive CAD 2. High LV filling pressures 3. Reduced cardiac output with index 2.3 4. Moderate pulmonary HTN.  3/30TTE >> 1. Left ventricular ejection fraction, by estimation, is 30 to 35%. The left ventricle has moderately decreased function. The left ventricle demonstrates regional wall motion abnormalities.There is mild left ventricular hypertrophy. Left ventricular diastolic function could not be evaluated. There is severe hypokinesis of the left ventricular, entire inferior wall, inferoseptal wall, apical segment and lateral wall.  2. Right ventricular systolic function is hyperdynamic. The right ventricular size is normal. There is moderately elevated pulmonary artery systolic pressure.  3. Decreased posterior leaflet motion due to ischemic tethering of the mitral valve leaflets.  4. The mitral valve is abnormal. Moderate to severe mitral valve regurgitation.  5. The aortic valve is tricuspid. Aortic valve regurgitation is not visualized. Mild aortic valve sclerosis is present, with no evidence of aortic valve stenosis.  6. The inferior vena cava is normal in size with greater than 50% respiratory variability, suggesting right atrial pressure of 3 mmHg.   4/1 CT chest w/o >>Large bilateral pleural effusions with associated atelectasis.Moderate to severe bilateral ground-glass opacities,  likely reflecting pneumonia and/or edema.  4/1 RHC >> RA =  18 RV = 60/20  PA = 63/29 (42) PCW = 33 Fick cardiac output/index = 3.7/1.9 PVR = 2.5 WU FA sat = 98% PA sat = 47%, 49% PaPi = 2.2   Micro Data:  5/7 R pleural fluid neg 5/7 blood neg to date 5/6 tracheal aspirate pending  Antimicrobials:    Interim history/subjective:  Remains agitated, did not sleep well. Co-ox this AM lower, 1 unit pRBC ordered. 490 out of chest tube  Objective   Blood pressure 110/61, pulse 71, temperature (!) 97 F (36.1 C), temperature source Oral, resp. rate 18, height _0  (1.803 m), weight 60.1 kg, SpO2 97 %. CVP:  [8 mmHg-22 mmHg] 22 mmHg  Vent Mode: PRVC FiO2 (%):  [40 %] 40 % Set Rate:  [30 bmp] 30 bmp Vt Set:  [560 mL] 560 mL PEEP:  [5 cmH20] 5 cmH20 Pressure Support:  [10 cmH20-12 cmH20] 12 cmH20 Plateau Pressure:  [16 cmH20-22 cmH20] 16 cmH20   Intake/Output Summary (Last 24 hours) at 05/25/2019 0739 Last data filed at 05/25/2019 0700 Gross per 24 hour  Intake 2629.36 ml  Output 2993 ml  Net -363.64 ml   Filed Weights   05/23/19 0500 05/24/19 0500 05/25/19 0545  Weight: 59.2 kg 58.2 kg 60.1 kg    Examination:   GEN: Frail elderly man on vent HEENT: Tracheostomy in place, small mucoid secretions CV: Heart sounds regular, extremities are warm PULM: He has severe scattered rhonchi bilaterally, chest tube in place with serous fluid, no air leak GI: Soft, hyperactive bowel sounds, NG tube and rectal in place EXT: Profound muscle wasting with superimposed anasarca NEURO: He will move all 4 extremities, intermittently to command PSYCH: RASS +2, hard to redirect SKIN: He has multiple areas of bruising and skin breakdown  Hgb a bit lower Sugars up   Assessment & Plan:    Recurrent hypoxemic and hypercarbic respiratory failure after CABG now s/p trach.  Multifactorial.  See discussion in 5/8 note.  Enterobacter in sputum so should treat.  - PS trials during day,  vent at night - Chest tube management per primary, can place pigtail in left pleural space PRN - consider stopping vanc and putting stop date on cefepime of 7 days  Shock-again multifactorial.  See discussion in 5/8 note. Improved.  Adrenal insufficiency effectively ruled out with high random cortisol. - Standing 25% albumin x 1 more day - Continue increased midodrine - Stop stress steroids  Agitated delirium-  - start seroquel standing and qHS melatonin - soft wrist restraints to prevent pulling out tubes - redirect as able, work on day/night cycles  Hyperactive bowel sounds with constipation? - Hold reglan, start sorbitol  Hyperglycemia- should settle out off stress steroids  Profound muscular deconditioning- biggest barrier to recovery at this point  Best practice:  Diet: Tube feeds Pain/Anxiety/Delirium protocol (if indicated): see above VAP protocol (if indicated):HOB 30 degrees  DVT prophylaxis: SCDs for now resumption of pharmacologic per primary GI prophylaxis: PPI Glucose control:SSI Mobility: BR Code Status: Full  Family Communication:per primary team Disposition:ICU  The patient is critically ill with multiple organ systems failure and requires high complexity decision making for assessment and support, frequent evaluation and titration of therapies, application of advanced monitoring technologies and extensive interpretation of multiple databases. Critical Care Time devoted to patient care services described in this note independent of APP/resident time (if applicable)  is 37 minutes.   Erskine Emery MD [facility] 05/25/2019 7:39 AM Personal pager: [phone] If unanswered, please  page CCM On-call: [phone]

## 2019-05-25 NOTE — Progress Notes (Signed)
RT note-Placed patient back to full support post ABG and RN concern that he was tired.

## 2019-05-25 NOTE — Progress Notes (Signed)
Hypoglycemic Event  CBG: 51 @ 0814  Treatment: Dextrose 50% 25g  Symptoms: None  Follow-up CBG: Time: 907 CBG Result: 122   Possible Reasons for Event: standing ordered insulin needs adjusted      Brendan [name]

## 2019-05-25 NOTE — Progress Notes (Signed)
Patient ID: Brendan [name], male   DOB: Oct 27, 1955, 64 y.o.   MRN: [id number]     Advanced Heart Failure Rounding Note  PCP-Cardiologist: No primary care provider on file.   Subjective:    Continues to deteriorate. Back on vent and CRRT (pullin -50)  Co-ox overnight 32%, 29% (? Accurate)  Getting 1uRBC now for hgb 7.1.   On NE 4-5.   Remains very weak. Lethargic.   Objective:   Weight Range: 60.1 kg Body mass index is 18.48 kg/m.   Vital Signs:   Temp:  [96.3 F (35.7 C)-97.6 F (36.4 C)] 97.4 F (36.3 C) (05/09 1025) Pulse Rate:  [66-134] 70 (05/09 1115) Resp:  [9-27] 22 (05/09 1115) BP: (105-127)/(44-67) 127/54 (05/09 1025) SpO2:  [76 %-100 %] 97 % (05/09 1115) Arterial Line BP: (91-143)/(39-64) 115/43 (05/09 1115) FiO2 (%):  [40 %] 40 % (05/09 1111) Weight:  [60.1 kg] 60.1 kg (05/09 0545) Last BM Date: 05/24/19  Weight change: Filed Weights   05/23/19 0500 05/24/19 0500 05/25/19 0545  Weight: 59.2 kg 58.2 kg 60.1 kg    Intake/Output:   Intake/Output Summary (Last 24 hours) at 05/25/2019 1156 Last data filed at 05/25/2019 1100 Gross per 24 hour  Intake 2818.7 ml  Output 3178 ml  Net -359.3 ml      Physical Exam   General:  Cachetic. Ill-appearing. On vent HEENT: normal + cor-trak Neck: supple. JVP 7-8. Carotids 2+ bilat; no bruits. No lymphadenopathy or thryomegaly appreciated. Cor: PMI nondisplaced. Regular rate & rhythm. + tunneled cath Lungs: diffuse rhonchi Abdomen: soft, nontender, nondistended. No hepatosplenomegaly. No bruits or masses. Good bowel sounds. Extremities: no cyanosis, clubbing, rash, 1+ edema Neuro: awake on vent. Weak. Moves all 4   Telemetry   NSR 70s personally reviewed.   Labs    CBC Recent Labs    05/24/19 0539 05/24/19 1849 05/25/19 0400 05/25/19 0437  WBC 9.8  --  8.5  --   HGB 8.1*   < > 7.1* 7.8*  HCT 26.1*   < > 23.6* 23.0*  MCV 99.2  --  100.0  --   PLT 162  --  157  --    < > = values in this interval  not displayed.   Basic Metabolic Panel Recent Labs    05/24/19 0539 05/24/19 1849 05/24/19 1957 05/24/19 1957 05/25/19 0400 05/25/19 0437  NA 134*   < > 132*   < > 135 135  K 5.2*   < > 5.5*   < > 5.3* 5.3*  CL 98  --  98  --  99  --   CO2 24  --  23  --  25  --   GLUCOSE 95  --  171*  --  213*  --   BUN 53*  --  39*  --  35*  --   CREATININE 2.30*  --  1.57*  --  1.36*  --   CALCIUM 8.4*  --  8.3*  --  8.9  --   MG 2.5*  --   --   --  2.7*  --   PHOS 3.1  --  3.5  --  3.6  --    < > = values in this interval not displayed.   Liver Function Tests Recent Labs    05/24/19 1957 05/25/19 0400  ALBUMIN 2.6* 3.2*   No results for input(s): LIPASE, AMYLASE in the last 72 hours. Cardiac Enzymes No results for input(s): CKTOTAL, CKMB, CKMBINDEX,  TROPONINI in the last 72 hours.  BNP: BNP (last 3 results) Recent Labs    04/13/2019 0113  BNP 655.7*    ProBNP (last 3 results) No results for input(s): PROBNP in the last 8760 hours.   D-Dimer No results for input(s): DDIMER in the last 72 hours. Hemoglobin A1C No results for input(s): HGBA1C in the last 72 hours. Fasting Lipid Panel No results for input(s): CHOL, HDL, LDLCALC, TRIG, CHOLHDL, LDLDIRECT in the last 72 hours. Thyroid Function Tests No results for input(s): TSH, T4TOTAL, T3FREE, THYROIDAB in the last 72 hours.  Invalid input(s): FREET3  Other results:   Imaging    No results found.   Medications:     Scheduled Medications: . aspirin EC  325 mg Oral Daily   Or  . aspirin  324 mg Per Tube Daily  . B-complex with vitamin C  1 tablet Per Tube Daily  . chlorhexidine gluconate (MEDLINE KIT)  15 mL Mouth Rinse BID  . Chlorhexidine Gluconate Cloth  6 each Topical Q0600  . citalopram  10 mg Per Tube Daily  . docusate  200 mg Per Tube Daily  . feeding supplement (PRO-STAT SUGAR FREE 64)  30 mL Per Tube BID  . insulin aspart  1 Units Subcutaneous Q4H  . insulin glargine  12 Units Subcutaneous BID  .  mouth rinse  15 mL Mouth Rinse 10 times per day  . melatonin  3 mg Per Tube QHS  . midodrine  20 mg Per Tube Q8H  . neomycin-polymyxin-hydrocortisone  3 drop Left EAR Q12H  . pantoprazole sodium  40 mg Per Tube BID  . QUEtiapine  25 mg Per Tube BID  . sevelamer carbonate  0.8 g Per Tube TID WC  . sodium chloride flush  10-40 mL Intracatheter Q12H    Infusions: .  prismasol BGK 4/2.5 500 mL/hr at 05/25/19 0707  .  prismasol BGK 4/2.5 300 mL/hr at 05/24/19 2046  . sodium chloride Stopped (05/25/19 1041)  . sodium chloride    . albumin human Stopped (05/25/19 1011)  . ceFEPime (MAXIPIME) IV Stopped (05/24/19 2230)  . feeding supplement (VITAL 1.5 CAL) 1,000 mL (05/25/19 0636)  . norepinephrine (LEVOPHED) Adult infusion 4 mcg/min (05/25/19 1100)  . prismasol BGK 4/2.5 1,500 mL/hr at 05/25/19 0907  . vancomycin Stopped (05/24/19 1733)    PRN Medications: Place/Maintain arterial line **AND** sodium chloride, sodium chloride, alum & mag hydroxide-simeth, dextrose, diphenhydrAMINE, fentaNYL (SUBLIMAZE) injection, Gerhardt's butt cream, heparin, heparin, levalbuterol, ondansetron (ZOFRAN) IV, oxyCODONE, [START ON 05/26/2019] pneumococcal 23 valent vaccine, sodium chloride flush, sorbitol     Assessment/Plan   1. Acute systolic HF ->  Cardiogenic Shock  - Due to iCM. EF 20-25% - Impella 5.5 placed on 4/2.   - s/p CABG/MVRepair on 4/6  - Impella out 4/9.  - Echo 4/10 with EF 25-30%, normal RV, no MR.  - Echo 4/22 EF 30-35% mildly reduced RV MVR ok   - Co-ox dropped precipitously overnight. Unclear if accurate. Will repeat after RBC transfusion - Continue NE for now to support BP and CO  - Continue midodrine 20 mg tid.   2. 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 Echo stable - No s/s ischemia - On ASA/Crestor  3. Acute hypoxic respiratory failure - Due to pulmonary edema and PNA - Extubated 4/9 re-intubated on 4/12. Failed re-extubation on 4/14. Now s/p  tracheostom - S/P Bronch 4/27 - 60K yeast. -> ?  Colonization.  - CT scan 5/7 with diffuse bilateral infiltrates c/w ARDS and large bilateral effusions R>L.  - R chest tube placed with > 1L out. Can tap left effusion as needed - He is very weak. Vent dependent. No change - Volume status managed with CVVHd. Weight up about 15 pounds from nadir. Pulling -50. May need to go to -100  4. Mitral Regurgitation - Mod-severe on ECHO - s/p MV repair, TEE 4/9 with minimal MR s/p repair.   - Echo 4/10 with no significant MR.  - echo 4/22 MVR stable  5. Uncontrolled DM - Hgb A1C 9.  - On insulin and sliding scale.  - No change  6. AKI - due to shock - Remains anuric post-op - Continue CVVHD per Nephrology. Management as above  7. Acute blood loss Anemia  - Hgb down to 7.1 - No obvious bleeding.  - transfuse one unit RBC this am  8. Polymorphic VT - Recurrent VT during respiratory distress 4/14, - Off amio  - No further VT.  - Keep K> 4.0 Mg > 2.0  - no change  9. Severe Malnutrition - Prealbumin improved, up from 8.9>>18.7 - Nutrition on board - Continue w/ TFs - Wil likely need PEG  10. Atrial fibrillation. paroxysmal - Remain in NSR  - Amiodarone was stopped.   57. Debility, severe - as above will likely need LTAC on d/c  12. Chronic pain issues - pain meds being tapered   He remains very weak and critically ill. Unable to liberate from vent or pressors. Can only tolerate CVVHD. Will need to discuss [place] with him and family.   CRITICAL CARE Performed by: Glori Bickers  Total critical care time: 35 minutes  Critical care time was exclusive of separately billable procedures and treating other patients.  Critical care was necessary to treat or prevent imminent or life-threatening deterioration.  Critical care was time spent personally by me (independent of midlevel providers or residents) on the following activities: development of treatment plan with patient  and/or surrogate as well as nursing, discussions with consultants, evaluation of patient's response to treatment, examination of patient, obtaining history from patient or surrogate, ordering and performing treatments and interventions, ordering and review of laboratory studies, ordering and review of radiographic studies, pulse oximetry and re-evaluation of patient's condition.    Glori Bickers, MD  05/25/2019 11:56 AM

## 2019-05-26 ENCOUNTER — Inpatient Hospital Stay (HOSPITAL_COMMUNITY): Payer: Medicaid Other

## 2019-05-26 LAB — BPAM RBC
Blood Product Expiration Date: 202105242359
ISSUE DATE / TIME: 202105090953
Unit Type and Rh: 9500

## 2019-05-26 LAB — RENAL FUNCTION PANEL
Albumin: 3.9 g/dL (ref 3.5–5.0)
Albumin: 3.4 g/dL — ABNORMAL LOW (ref 3.5–5.0)
Anion gap: 11 (ref 5–15)
Anion gap: 9 (ref 5–15)
BUN: 28 mg/dL — ABNORMAL HIGH (ref 8–23)
BUN: 26 mg/dL — ABNORMAL HIGH (ref 8–23)
CO2: 25 mmol/L (ref 22–32)
CO2: 27 mmol/L (ref 22–32)
Calcium: 9.1 mg/dL (ref 8.9–10.3)
Calcium: 8.8 mg/dL — ABNORMAL LOW (ref 8.9–10.3)
Chloride: 102 mmol/L (ref 98–111)
Chloride: 101 mmol/L (ref 98–111)
Creatinine, Ser: 1.03 mg/dL (ref 0.61–1.24)
Creatinine, Ser: 1.05 mg/dL (ref 0.61–1.24)
GFR calc Af Amer: >60 mL/min (ref >60)
GFR calc Af Amer: >60 mL/min (ref >60)
GFR calc non Af Amer: >60 mL/min (ref >60)
GFR calc non Af Amer: >60 mL/min (ref >60)
Glucose, Bld: 78 mg/dL (ref 70–99)
Glucose, Bld: 165 mg/dL — ABNORMAL HIGH (ref 70–99)
Phosphorus: 2.1 mg/dL — ABNORMAL LOW (ref 2.5–4.6)
Phosphorus: 2 mg/dL — ABNORMAL LOW (ref 2.5–4.6)
Potassium: 4.6 mmol/L (ref 3.5–5.1)
Potassium: 5 mmol/L (ref 3.5–5.1)
Sodium: 138 mmol/L (ref 135–145)
Sodium: 137 mmol/L (ref 135–145)

## 2019-05-26 LAB — GLUCOSE, CAPILLARY
Glucose-Capillary: 105 mg/dL — ABNORMAL HIGH (ref 70–99)
Glucose-Capillary: 139 mg/dL — ABNORMAL HIGH (ref 70–99)
Glucose-Capillary: 153 mg/dL — ABNORMAL HIGH (ref 70–99)
Glucose-Capillary: 75 mg/dL (ref 70–99)
Glucose-Capillary: 85 mg/dL (ref 70–99)
Glucose-Capillary: 98 mg/dL (ref 70–99)

## 2019-05-26 LAB — TYPE AND SCREEN
ABO/RH(D): O NEG
Antibody Screen: NEGATIVE
Unit division: 0

## 2019-05-26 LAB — POCT I-STAT 7, (LYTES, BLD GAS, ICA,H+H)
Acid-Base Excess: 1 mmol/L (ref 0.0–2.0)
Bicarbonate: 26.2 mmol/L (ref 20.0–28.0)
Calcium, Ion: 1.24 mmol/L (ref 1.15–1.40)
HCT: 28 % — ABNORMAL LOW (ref 39.0–52.0)
Hemoglobin: 9.5 g/dL — ABNORMAL LOW (ref 13.0–17.0)
O2 Saturation: 96 %
Patient temperature: 94.3
Potassium: 4.5 mmol/L (ref 3.5–5.1)
Sodium: 138 mmol/L (ref 135–145)
TCO2: 27 mmol/L (ref 22–32)
pCO2 arterial: 38.2 mmHg (ref 32.0–48.0)
pH, Arterial: 7.434 (ref 7.350–7.450)
pO2, Arterial: 71 mmHg — ABNORMAL LOW (ref 83.0–108.0)

## 2019-05-26 LAB — CBC
HCT: 26.2 % — ABNORMAL LOW (ref 39.0–52.0)
Hemoglobin: 8.1 g/dL — ABNORMAL LOW (ref 13.0–17.0)
MCH: 29.9 pg (ref 26.0–34.0)
MCHC: 30.9 g/dL (ref 30.0–36.0)
MCV: 96.7 fL (ref 80.0–100.0)
Platelets: 193 10*3/uL (ref 150–400)
RBC: 2.71 MIL/uL — ABNORMAL LOW (ref 4.22–5.81)
RDW: 18 % — ABNORMAL HIGH (ref 11.5–15.5)
WBC: 9.8 10*3/uL (ref 4.0–10.5)
nRBC: 0 % (ref 0.0–0.2)

## 2019-05-26 LAB — COOXEMETRY PANEL
Carboxyhemoglobin: 1.9 % — ABNORMAL HIGH (ref 0.5–1.5)
Methemoglobin: 1.1 % (ref 0.0–1.5)
O2 Saturation: 53.1 %
Total hemoglobin: 8.8 g/dL — ABNORMAL LOW (ref 12.0–16.0)

## 2019-05-26 LAB — PNEUMOCYSTIS JIROVECI SMEAR BY DFA: Pneumocystis jiroveci Ag: NEGATIVE

## 2019-05-26 LAB — MAGNESIUM: Magnesium: 2.8 mg/dL — ABNORMAL HIGH (ref 1.7–2.4)

## 2019-05-26 MED ORDER — INSULIN GLARGINE 100 UNIT/ML ~~LOC~~ SOLN
8.0000 [IU] | Freq: Two times a day (BID) | SUBCUTANEOUS | Status: DC
  Administered 2019-05-26 – 2019-05-29 (×7): 8 [IU] via SUBCUTANEOUS
  Filled 2019-05-26 (×8): qty 0.08

## 2019-05-26 MED ORDER — ROPINIROLE HCL 0.5 MG PO TABS
0.5000 mg | ORAL_TABLET | Freq: Every day | ORAL | Status: DC
  Administered 2019-05-26 – 2019-06-20 (×26): 0.5 mg via ORAL
  Filled 2019-05-26 (×27): qty 1

## 2019-05-26 MED ORDER — SENNOSIDES 8.8 MG/5ML PO SYRP
5.0000 mL | ORAL_SOLUTION | Freq: Two times a day (BID) | ORAL | Status: DC
  Administered 2019-05-26 – 2019-06-10 (×23): 5 mL
  Filled 2019-05-26 (×25): qty 5

## 2019-05-26 MED ORDER — DIPHENHYDRAMINE HCL 12.5 MG/5ML PO ELIX
12.5000 mg | ORAL_SOLUTION | Freq: Two times a day (BID) | ORAL | Status: DC | PRN
  Administered 2019-05-27 – 2019-05-29 (×3): 12.5 mg
  Filled 2019-05-26 (×3): qty 5

## 2019-05-26 MED ORDER — OXYCODONE HCL 20 MG/ML PO CONC
5.0000 mg | ORAL | Status: DC | PRN
  Administered 2019-05-27 – 2019-06-01 (×20): 5 mg
  Filled 2019-05-26 (×20): qty 1

## 2019-05-26 MED ORDER — SORBITOL 70 % SOLN
30.0000 mL | Freq: Once | Status: AC
  Administered 2019-05-26: 30 mL via ORAL
  Filled 2019-05-26: qty 30

## 2019-05-26 MED ORDER — BISACODYL 10 MG RE SUPP
10.0000 mg | Freq: Once | RECTAL | Status: AC
  Administered 2019-05-26: 10 mg via RECTAL
  Filled 2019-05-26: qty 1

## 2019-05-26 MED ORDER — QUETIAPINE FUMARATE 25 MG PO TABS
25.0000 mg | ORAL_TABLET | Freq: Every day | ORAL | Status: DC
  Administered 2019-05-26 – 2019-06-20 (×26): 25 mg via ORAL
  Filled 2019-05-26 (×26): qty 1

## 2019-05-26 NOTE — Progress Notes (Signed)
SLP Cancellation Note  Patient Details Name: Brendan [name] MRN: [id number] DOB: April 12, 1955   Cancelled treatment:       Reason Eval/Treat Not Completed: Patient not medically ready   Janann Boeve, Katherene Ponto 05/26/2019, 8:31 AM

## 2019-05-26 NOTE — Progress Notes (Signed)
Physical Therapy Treatment Patient Details Name: Brendan [name] MRN: [id number] DOB: August 29, 1955 Today's Date: 05/26/2019    History of Present Illness Pt presented with SOB and fatigue at [facility] found to have new HFrEF,  NSTEMI, and bilateral pleural effusions transferred to [facility] on 3/29 for further cardiac evaluation.  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  On the evening of 4/1, patient developed worsening respiratory distress and hypoxia, head R/L heart cath on 3/30 & R heart cath on 4/1 pt intubated on 4/1, extubated 4/4, had R & L chest tubes placed 4/2 due to bilateral pleural effusions, removed 4/5 and impella device placed 4/2.  CABG and MVR on 4/6. Extubated 4/7. Removal of impella 4/9. CVVH started 4/10 and off 4/30. Respiratory distress and reintubated 4/12. Extubated 4/14. Reintubated 4/15. Trach 4/16.  CRRT off 4/29 then restarted 5/7. Trach collar 5/2 with return to vent 5/7. PMHx: Tobacco abuse, HTN, poorly controlled DM, diabetic neuropathy.    PT Comments    Pt returned to vent and CRRT since last session with pt limited by cognition and medical complexities. Pt with CRRT alarming low flow with transition to standing and required return to supine. Attempted to return to sitting but alarm returned and unable to attempt further sitting or standing. Alarms ceased with HOB<45 degrees. Pt limited by cognition and responsiveness to commands this session. Pt restless with arms reaching to head and subclavian during session with bil wrist restraints reapplied. Will continue to follow as medical stability allows.  Pt on vent, cpap with SpO2 100% Hr 81    Follow Up Recommendations  LTACH     Equipment Recommendations  None recommended by PT    Recommendations for Other Services       Precautions / Restrictions Precautions Precautions: Fall;Sternal Precaution Comments: flexiseal, trach on vent, CRRT, bil wrist restraints    Mobility  Bed Mobility Overal bed mobility: Needs  Assistance Bed Mobility: Supine to Sit;Sit to Supine;Rolling Rolling: Mod assist         General bed mobility comments: Used foot egress to transition to sitting and back to bed x 2 trials. cues and physical assist to roll  Transfers Overall transfer level: Needs assistance   Transfers: Sit to/from Stand Sit to Stand: Max assist;From elevated surface;+2 physical assistance         General transfer comment: max +2 assist to stand with knees blocked and pad used to elevate sacrum with pt able to maintain standing 45 sec with good trunk control.  Ambulation/Gait             General Gait Details: unable   Stairs             Wheelchair Mobility    Modified Rankin (Stroke Patients Only)       Balance Overall balance assessment: Needs assistance Sitting-balance support: Feet supported Sitting balance-Leahy Scale: Poor     Standing balance support: No upper extremity supported;During functional activity Standing balance-Leahy Scale: Poor Standing balance comment: physical assist at knees and pelvis for standing with bil UE on therapist arms                            Cognition Arousal/Alertness: Awake/alert Behavior During Therapy: Flat affect Overall Cognitive Status: Impaired/Different from baseline Area of Impairment: Attention;Following commands                   Current Attention Level: Focused   Following Commands: Follows  one step commands inconsistently       General Comments: pt with flat affect without significant command following this session.      Exercises General Exercises - Lower Extremity Long Arc Quad: AAROM;Both;10 reps;Seated Heel Slides: PROM;Both;Supine;10 reps    General Comments        Pertinent Vitals/Pain Pain Assessment: No/denies pain    Home Living                      Prior Function            PT Goals (current goals can now be found in the care plan section) Progress towards PT  goals: Not progressing toward goals - comment    Frequency    Min 2X/week      PT Plan Current plan remains appropriate;Frequency needs to be updated    Co-evaluation              AM-PAC PT "6 Clicks" Mobility   Outcome Measure  Help needed turning from your back to your side while in a flat bed without using bedrails?: A Lot Help needed moving from lying on your back to sitting on the side of a flat bed without using bedrails?: A Lot Help needed moving to and from a bed to a chair (including a wheelchair)?: Total Help needed standing up from a chair using your arms (e.g., wheelchair or bedside chair)?: Total Help needed to walk in hospital room?: Total Help needed climbing 3-5 steps with a railing? : Total 6 Click Score: 8    End of Session   Activity Tolerance: Treatment limited secondary to medical complications (Comment) Patient left: in bed;with call bell/phone within reach;with bed alarm set;with nursing/sitter in room Nurse Communication: Mobility status PT Visit Diagnosis: Other abnormalities of gait and mobility (R26.89);Muscle weakness (generalized) (M62.81)     Time: 9604-5409 PT Time Calculation (min) (ACUTE ONLY): 32 min  Charges:  $Therapeutic Activity: 8-22 mins                     Kobee Medlen P, PT Acute Rehabilitation Services Pager: [phone] Office: [place] Erinn Mendosa 05/26/2019, 12:58 PM

## 2019-05-26 NOTE — Progress Notes (Signed)
Occupational Therapy Treatment Patient Details Name: Brendan [name] MRN: [id number] DOB: 04/30/1955 Today's Date: 05/26/2019    History of present illness Pt presented with SOB and fatigue at [facility] found to have new HFrEF,  NSTEMI, and bilateral pleural effusions transferred to [facility] on 3/29 for further cardiac evaluation.  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  On the evening of 4/1, patient developed worsening respiratory distress and hypoxia, head R/L heart cath on 3/30 & R heart cath on 4/1 pt intubated on 4/1, extubated 4/4, had R & L chest tubes placed 4/2 due to bilateral pleural effusions, removed 4/5 and impella device placed 4/2.  CABG and MVR on 4/6. Extubated 4/7. Removal of impella 4/9. CVVH started 4/10 and off 4/30. Respiratory distress and reintubated 4/12. Extubated 4/14. Reintubated 4/15. Trach 4/16.  CRRT off 4/29 then restarted 5/7. Trach collar 5/2 with return to vent 5/7. PMHx: Tobacco abuse, HTN, poorly controlled DM, diabetic neuropathy.   OT comments  Pt performing BUE HEP in supported supine with HOB raised. Pt with decreased clarity of cognition today and not following commands as easily. MaxA+2 for sit to stand ~45 secs; minA +2 for bed mobility. Pt on 40% at 5 PEEP, HR 81 BPM. Pt's CRRT machine malfunctioning and unable to progress to x2 sit to stands today. Pt would benefit from continued OT skilled services for ADL, mobility and energy conservation. OT following. Goals updated.    Follow Up Recommendations  LTACH    Equipment Recommendations  None recommended by OT    Recommendations for Other Services      Precautions / Restrictions Precautions Precautions: Fall;Sternal Precaution Comments: trach on vent, CRRT, bil wrist restraints       Mobility Bed Mobility Overal bed mobility: Needs Assistance Bed Mobility: Supine to Sit;Sit to Supine;Rolling Rolling: Mod assist         General bed mobility comments: Used foot egress to transition to sitting and back  to bed x 2 trials. cues and physical assist to roll  Transfers Overall transfer level: Needs assistance   Transfers: Sit to/from Stand Sit to Stand: Max assist;From elevated surface;+2 physical assistance         General transfer comment: max +2 assist to stand with knees blocked and pad used to elevate sacrum with pt able to maintain standing 45 sec with good trunk control.    Balance Overall balance assessment: Needs assistance Sitting-balance support: Feet supported Sitting balance-Leahy Scale: Poor     Standing balance support: No upper extremity supported;During functional activity Standing balance-Leahy Scale: Poor Standing balance comment: physical assist for knee blocking and pelvis support for standing with bil UE on therapist arms                           ADL either performed or assessed with clinical judgement   ADL Overall ADL's : Needs assistance/impaired                                     Functional mobility during ADLs: Maximal assistance;+2 for physical assistance;Cueing for safety General ADL Comments: Pt performing BUE HEP in supported supine with HOB raised. Pt with decreased clarity of cognition today and not following commands as easily.     Vision       Perception     Praxis      Cognition Arousal/Alertness: Awake/alert Behavior During  Therapy: Flat affect Overall Cognitive Status: Impaired/Different from baseline Area of Impairment: Attention;Following commands                   Current Attention Level: Focused   Following Commands: Follows one step commands inconsistently       General Comments: pt with flat affect without significant command following this session.        Exercises Exercises: Other exercises;General Upper Extremity General Exercises - Upper Extremity Shoulder Flexion: AROM;Both;10 reps;Supine Elbow Flexion: AROM;Both;10 reps;Supine Elbow Extension: AROM;10 reps;Supine Wrist  Flexion: AROM;10 reps;Both;Supine Wrist Extension: AROM;Both;10 reps;Supine Digit Composite Flexion: AROM;Both;10 reps;Supine Composite Extension: AROM;Both;Supine General Exercises - Lower Extremity Long Arc Quad: AAROM;Both;10 reps;Seated Heel Slides: PROM;Both;Supine;10 reps Other Exercises Other Exercises: hand squeezes   Shoulder Instructions       General Comments Pt on 40%at 5 PEEP, HR 81 BPM. Pt's CRRT machine malfunctioning and unable to progress to x2 sit to stands today.    Pertinent Vitals/ Pain       Pain Assessment: No/denies pain  Home Living                                          Prior Functioning/Environment              Frequency  Min 2X/week        Progress Toward Goals  OT Goals(current goals can now be found in the care plan section)  Progress towards OT goals: Progressing toward goals;Goals drowngraded-see care plan  Acute Rehab OT Goals Patient Stated Goal: not stated OT Goal Formulation: Patient unable to participate in goal setting Time For Goal Achievement: 06/09/19 Potential to Achieve Goals: Good ADL Goals Pt Will Perform Grooming: with supervision;sitting Pt Will Perform Lower Body Dressing: with mod assist;with adaptive equipment;sitting/lateral leans;sit to/from stand Pt/caregiver will Perform Home Exercise Program: Increased strength;Both right and left upper extremity;With Supervision Additional ADL Goal #1: Pt  will verbalize 3 strategies to conserve energy during functional ADL and mobility tasks with no verbal cues Additional ADL Goal #2: Pt will increase to minA overall for bed mobility as precursor for OOB ADL. Additional ADL Goal #3: Pt will be able to verbalize/abide by sternal precautions with minimal cues required throughout session.  Plan Discharge plan remains appropriate    Co-evaluation    PT/OT/SLP Co-Evaluation/Treatment: Yes Reason for Co-Treatment: To address functional/ADL transfers;For  patient/therapist safety   OT goals addressed during session: Strengthening/ROM      AM-PAC OT "6 Clicks" Daily Activity     Outcome Measure   Help from another person eating meals?: A Little Help from another person taking care of personal grooming?: A Lot Help from another person toileting, which includes using toliet, bedpan, or urinal?: Total Help from another person bathing (including washing, rinsing, drying)?: A Lot Help from another person to put on and taking off regular upper body clothing?: A Lot Help from another person to put on and taking off regular lower body clothing?: Total 6 Click Score: 11    End of Session Equipment Utilized During Treatment: Oxygen  OT Visit Diagnosis: Unsteadiness on feet (R26.81);Muscle weakness (generalized) (M62.81);Other symptoms and signs involving cognitive function   Activity Tolerance Treatment limited secondary to medical complications (Comment);Patient limited by fatigue;Patient limited by lethargy   Patient Left in bed;with call bell/phone within reach;with bed alarm set;with nursing/sitter in room   Nurse Communication Mobility  status        Time: 7116-5790 OT Time Calculation (min): 27 min  Charges: OT General Charges $OT Visit: 1 Visit OT Treatments $Therapeutic Activity: 8-22 mins  Jefferey Pica, OTR/L Acute Rehabilitation Services Pager: [phone] Office: [phone]    Brendan [name] 05/26/2019, 4:32 PM

## 2019-05-26 NOTE — Progress Notes (Signed)
EVENING ROUNDS NOTE :     [address]       [place] [address]             [phone]                 24 Days Post-Op Procedure(s) (LRB): VIDEO BRONCHOSCOPY USING DISPOSABLE ANESTHESIA SCOPE (N/A) TRACHEOSTOMY (N/A)   Total Length of Stay:  LOS: 42 days  Events:   No events Visiting with family     BP (!) 128/56 (BP Location: Other (Comment)) Comment (BP Location): aline  Pulse 72   Temp 97.6 F (36.4 C) (Oral)   Resp 14   Ht 5\' 11"  (1.803 m) Comment: measured x 3  Wt 55.3 kg Comment: O2 tank found on bed- taken off. Weight with one pillow  SpO2 100%   BMI 17.00 kg/m   CVP:  [4 mmHg-18 mmHg] 6 mmHg  Vent Mode: PSV;CPAP FiO2 (%):  [40 %] 40 % Set Rate:  [30 bmp] 30 bmp Vt Set:  [560 mL] 560 mL PEEP:  [5 cmH20] 5 cmH20 Pressure Support:  [10 cmH20] 10 cmH20 Plateau Pressure:  [14 cmH20-28 cmH20] 28 cmH20  .  prismasol BGK 4/2.5 500 mL/hr at 05/26/19 1448  .  prismasol BGK 4/2.5 300 mL/hr at 05/26/19 0804  . sodium chloride Stopped (05/26/19 1219)  . sodium chloride    . ceFEPime (MAXIPIME) IV Stopped (05/26/19 1025)  . feeding supplement (VITAL 1.5 CAL) 1,000 mL (05/26/19 0043)  . norepinephrine (LEVOPHED) Adult infusion 8 mcg/min (05/26/19 1500)  . prismasol BGK 4/2.5 1,500 mL/hr at 05/26/19 1315    I/O last 3 completed shifts: In: 4108.7 [I.V.:308.9; Blood:315; NG/GT:2450; IV Piggyback:1034.8] Out: 2440 [Other:4926; Chest Tube:710]   CBC Latest Ref Rng & Units 05/26/2019 05/26/2019 05/25/2019  WBC 4.0 - 10.5 K/uL 9.8 - -  Hemoglobin 13.0 - 17.0 g/dL 8.1(L) 9.5(L) 9.5(L)  Hematocrit 39.0 - 52.0 % 26.2(L) 28.0(L) 28.0(L)  Platelets 150 - 400 K/uL 193 - -    BMP Latest Ref Rng & Units 05/26/2019 05/26/2019 05/25/2019  Glucose 70 - 99 mg/dL 78 - -  BUN 8 - 23 mg/dL 28(H) - -  Creatinine 0.61 - 1.24 mg/dL 1.03 - -  Sodium 135 - 145 mmol/L 138 138 136  Potassium 3.5 - 5.1 mmol/L 4.6 4.5 4.9  Chloride 98 - 111 mmol/L 102 - -  CO2 22 - 32  mmol/L 25 - -  Calcium 8.9 - 10.3 mg/dL 9.1 - -    ABG    Component Value Date/Time   PHART 7.434 05/26/2019 0444   PCO2ART 38.2 05/26/2019 0444   PO2ART 71 (L) 05/26/2019 0444   HCO3 26.2 05/26/2019 0444   TCO2 27 05/26/2019 0444   ACIDBASEDEF 3.0 (H) 04/28/2019 0735   O2SAT 53.1 05/26/2019 0445       Melodie Bouillon, MD 05/26/2019 3:56 PM

## 2019-05-26 NOTE — Progress Notes (Addendum)
NAME:  Brendan [name], MRN:  [id number], DOB:  March 24, 1955, LOS: 53 ADMISSION DATE:  04/04/2019, CONSULTATION DATE:  04/17/19 REFERRING MD:  Dr. Haroldine Laws, CHIEF COMPLAINT:  Respiratory distress   Brief History   84 yoM originally presented with SOB and fatigue at [facility] found to have new HFrEF, NSTEMI, and bilateral pleural effusions transferred to [facility] on 3/29 for further cardiac evaluation. 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 Started on lasix and milrinone gtts however developed NSVT. Was taken 4/1 for placement of IABP and swan for optimization prior to CABG. Was on precedex for agitation/ confusion, some concern for DTs. On the evening of 4/1, patient developed worsening respiratory distress and hypoxia, PCCM consulted for intubation and vent management.  Patient was extubated on 4/14 and developed worsening hypoxia on BiPAP. Overnight he developed pulseless VT requiring 1 minute of CPR and epinephrine to achieve ROSC. Neurovascularly intact afterward. Subsequently had increased work of breathing and worsening respiratory distress requiring intubation. Patient had tracheostomy performed on 05/12/2019. Trying to wean off ventilator.   Past Medical History  Tobacco abuse, HTN, poorly controlled diabetes, diabetic neuropathy  Significant Hospital Events   3/30 Lt heart cath/TEE performed 4/1 Intubated, RHC/IABP/PA cath 4/2 Impella placement 4/3 febrile over evening and night. Blood cultures obtained. Vanc/cefepime started. Hematuria--heparin held. 4/4 Extubated,abx transitioned to rocephin. Heparin restarted 4/6 Re-intubated for CABG/MVR 4/7 Extubated from CABG to BiPAP 4/9 Impella removed  4/9 TEE 4/10 CVC inserted &CRRT initiated  4/12 PCCMre-consulted re-intubation 4/14 Extubated to BiPAP 4/15 Re-intubated due to respiratory distress 4/16 Tracheostomy 4/27 bronch with BAL 5/7 called back for hypercarbic failure. #6 cuffed placed. Right chest tube placed for enlarging  effusion.  Consults:  TCTS PCCM HF Nephrology  Procedures:  R cephalic double lumen PICC LIJ tunneled HD catheter L brachial arterial line 6-0 cuffed shiley  Significant Diagnostic Tests:  3/30 R/ LHC >>  Ost LM to Mid LM lesion is 35% stenosed.  Prox LAD lesion is 90% stenosed.  Prox LAD to Mid LAD lesion is 50% stenosed.  Mid Cx lesion is 100% stenosed.  Prox RCA to Mid RCA lesion is 90% stenosed.  RPDA lesion is 95% stenosed.  LV end diastolic pressure is severely elevated.  Hemodynamic findings consistent with moderate pulmonary hypertension. 1. Severe 3 vessel obstructive CAD 2. High LV filling pressures 3. Reduced cardiac output with index 2.3 4. Moderate pulmonary HTN.  3/30TTE >> 1. Left ventricular ejection fraction, by estimation, is 30 to 35%. The left ventricle has moderately decreased function. The left ventricle demonstrates regional wall motion abnormalities.There is mild left ventricular hypertrophy. Left ventricular diastolic function could not be evaluated. There is severe hypokinesis of the left ventricular, entire inferior wall, inferoseptal wall, apical segment and lateral wall.  2. Right ventricular systolic function is hyperdynamic. The right ventricular size is normal. There is moderately elevated pulmonary artery systolic pressure.  3. Decreased posterior leaflet motion due to ischemic tethering of the mitral valve leaflets.  4. The mitral valve is abnormal. Moderate to severe mitral valve regurgitation.  5. The aortic valve is tricuspid. Aortic valve regurgitation is not visualized. Mild aortic valve sclerosis is present, with no evidence of aortic valve stenosis.  6. The inferior vena cava is normal in size with greater than 50% respiratory variability, suggesting right atrial pressure of 3 mmHg.   4/1 CT chest w/o >>Large bilateral pleural effusions with associated atelectasis.Moderate to severe bilateral ground-glass opacities,  likely reflecting pneumonia and/or edema.  4/1 RHC >> RA =  18 RV = 60/20  PA = 63/29 (42) PCW = 33 Fick cardiac output/index = 3.7/1.9 PVR = 2.5 WU FA sat = 98% PA sat = 47%, 49% PaPi = 2.2   Micro Data:  5/7 R pleural fluid neg 5/7 blood neg to date 5/6 tracheal aspirate pending  Antimicrobials:    Interim history/subjective:  Slept better last night, more oriented this AM. Oxycodone seems to have knocked him out. Remains on full vent support, CRRT, and pressors.  Objective   Blood pressure (!) 128/56, pulse 78, temperature (!) 94.3 F (34.6 C), temperature source Oral, resp. rate 18, height _0  (1.803 m), weight 55.3 kg, SpO2 100 %. CVP:  [6 mmHg-18 mmHg] 6 mmHg  Vent Mode: PRVC FiO2 (%):  [40 %] 40 % Set Rate:  [30 bmp] 30 bmp Vt Set:  [560 mL] 560 mL PEEP:  [5 cmH20] 5 cmH20 Pressure Support:  [10 cmH20] 10 cmH20 Plateau Pressure:  [14 cmH20-28 cmH20] 28 cmH20   Intake/Output Summary (Last 24 hours) at 05/26/2019 0718 Last data filed at 05/26/2019 0700 Gross per 24 hour  Intake 2949.09 ml  Output 3951 ml  Net -1001.91 ml   Filed Weights   05/24/19 0500 05/25/19 0545 05/26/19 0500  Weight: 58.2 kg 60.1 kg 55.3 kg    Examination:   GEN: Frail elderly man on vent HEENT: Tracheostomy in place, small mucoid secretions CV: Heart sounds regular, extremities are warm PULM: less rhonci, chest tube in place with serous fluid, no air leak GI: Soft, hyperactive bowel sounds, NG tube and rectal in place EXT: Profound muscle wasting with superimposed anasarca NEURO: He will move all 4 extremities, intermittently to command PSYCH: RASS -3 this AM SKIN: He has multiple areas of bruising and skin breakdown    CXR continued bilateral infiltrates, worse on R today Chemistries benign CBC looks okay  Assessment & Plan:    Recurrent hypoxemic and hypercarbic respiratory failure after CABG now s/p trach.  Multifactorial.  See discussion in 5/8 note.   Enterobacter in sputum so should treat.  - PS trials during day, vent at night - Chest tube management per primary, can place pigtail in left pleural space PRN - dc vanc, cefepime x 7 days  Shock-again multifactorial.  See discussion in 5/8 note. I think at this point it is just cardiogenic and renal vasoplegic. - Continue pressors and high dose midodrine, not sure what else to do here  Agitated delirium-  - change seroquel to qHS, continue melatonin - drop oxycodone in half  Constipation - Sorbitol - DC rectal tube in case it is blocking things - Add dulcolax suppository  Hyperglycemia- now a little too low off of steroids, drop lantus a bit  Profound muscular deconditioning- biggest barrier to recovery at this point  Worsened CXR- clinically looks better, wonder if reaction to transfusion, check AM CXR and continue fluid removal as able (-1L 5/10)  Goals of care- will have palliative establish relationship with patient given prolonged hospital stay with multiple complications  Best practice:  Diet: Tube feeds Pain/Anxiety/Delirium protocol (if indicated): see above VAP protocol (if indicated):HOB 30 degrees  DVT prophylaxis: SCDs for now resumption of pharmacologic per primary (h/h keeps dropping) GI prophylaxis: PPI Glucose control:SSI Mobility: BR Code Status: Full  Family Communication:per primary team Disposition:ICU pending CRRT and vent liberation  The patient is critically ill with multiple organ systems failure and requires high complexity decision making for assessment and support, frequent evaluation and titration of therapies, application of  advanced monitoring technologies and extensive interpretation of multiple databases. Critical Care Time devoted to patient care services described in this note independent of APP/resident time (if applicable)  is 32 minutes.   Erskine Emery MD [facility] 05/26/2019 7:18 AM Personal pager: [phone] If  unanswered, please page CCM On-call: [phone]

## 2019-05-26 NOTE — Progress Notes (Addendum)
Patient ID: Brendan [name], male   DOB: 07-14-1955, 64 y.o.   MRN: [id number]     Advanced Heart Failure Rounding Note  PCP-Cardiologist: No primary care provider on file.   Subjective:    Remains on vent and CRRT   On 5/9 received 1UPRBCs. 7.1>8.1   Currently on Norepi 10 mcg. CO-OX 53%   Drowsy. Received 10 mg oxycontinue this morning.     Objective:   Weight Range: 55.3 kg Body mass index is 17 kg/m.   Vital Signs:   Temp:  [94.3 F (34.6 C)-97.9 F (36.6 C)] 94.3 F (34.6 C) (05/10 0447) Pulse Rate:  [70-111] 78 (05/10 0700) Resp:  [13-30] 18 (05/10 0700) BP: (121-141)/(46-57) 128/56 (05/10 0400) SpO2:  [93 %-100 %] 100 % (05/10 0700) Arterial Line BP: (111-153)/(43-65) 142/55 (05/10 0700) FiO2 (%):  [40 %] 40 % (05/10 0447) Weight:  [55.3 kg] 55.3 kg (05/10 0500) Last BM Date: 05/24/19  Weight change: Filed Weights   05/24/19 0500 05/25/19 0545 05/26/19 0500  Weight: 58.2 kg 60.1 kg 55.3 kg    Intake/Output:   Intake/Output Summary (Last 24 hours) at 05/26/2019 0711 Last data filed at 05/26/2019 0700 Gross per 24 hour  Intake 2949.09 ml  Output 3951 ml  Net -1001.91 ml      Physical Exam  General:  Thin, cachetic. On vent/trach HEENT: normal Neck: trach,supple. JVD 9-10 . Carotids 2+ bilat; no bruits. No lymphadenopathy or thryomegaly appreciated. Cor: PMI nondisplaced. Regular rate & rhythm. No rubs, gallops or murmurs. + Tunneled cath left upper chest  Lungs: Rhonchi throughout Abdomen: soft, nontender, nondistended. No hepatosplenomegaly. No bruits or masses. Good bowel sounds. Extremities: no cyanosis, clubbing, rash, R and LLE 1+edema Neuro:Drowsy on vent   Telemetry   NSR 70s   Labs    CBC Recent Labs    05/25/19 0400 05/25/19 0437 05/26/19 0444 05/26/19 0445  WBC 8.5  --   --  9.8  HGB 7.1*   < > 9.5* 8.1*  HCT 23.6*   < > 28.0* 26.2*  MCV 100.0  --   --  96.7  PLT 157  --   --  193   < > = values in this interval not  displayed.   Basic Metabolic Panel Recent Labs    05/25/19 0400 05/25/19 0437 05/25/19 1605 05/25/19 1614 05/26/19 0444 05/26/19 0445  NA 135   < > 136   < > 138 138  K 5.3*   < > 5.0   < > 4.5 4.6  CL 99   < > 101  --   --  102  CO2 25   < > 25  --   --  25  GLUCOSE 213*   < > 180*  --   --  78  BUN 35*   < > 30*  --   --  28*  CREATININE 1.36*   < > 1.20  --   --  1.03  CALCIUM 8.9   < > 8.9  --   --  9.1  MG 2.7*  --   --   --   --  2.8*  PHOS 3.6   < > 2.8  --   --  2.1*   < > = values in this interval not displayed.   Liver Function Tests Recent Labs    05/25/19 1605 05/26/19 0445  ALBUMIN 3.3* 3.9   No results for input(s): LIPASE, AMYLASE in the last 72 hours. Cardiac Enzymes No  results for input(s): CKTOTAL, CKMB, CKMBINDEX, TROPONINI in the last 72 hours.  BNP: BNP (last 3 results) Recent Labs    03/24/2019 0113  BNP 655.7*    ProBNP (last 3 results) No results for input(s): PROBNP in the last 8760 hours.   D-Dimer No results for input(s): DDIMER in the last 72 hours. Hemoglobin A1C No results for input(s): HGBA1C in the last 72 hours. Fasting Lipid Panel No results for input(s): CHOL, HDL, LDLCALC, TRIG, CHOLHDL, LDLDIRECT in the last 72 hours. Thyroid Function Tests No results for input(s): TSH, T4TOTAL, T3FREE, THYROIDAB in the last 72 hours.  Invalid input(s): FREET3  Other results:   Imaging    No results found.   Medications:     Scheduled Medications: . aspirin EC  325 mg Oral Daily   Or  . aspirin  324 mg Per Tube Daily  . B-complex with vitamin C  1 tablet Per Tube Daily  . chlorhexidine gluconate (MEDLINE KIT)  15 mL Mouth Rinse BID  . Chlorhexidine Gluconate Cloth  6 each Topical Q0600  . citalopram  10 mg Per Tube Daily  . docusate  200 mg Per Tube Daily  . feeding supplement (PRO-STAT SUGAR FREE 64)  30 mL Per Tube BID  . gabapentin  100 mg Oral Q8H  . insulin aspart  1 Units Subcutaneous Q4H  . insulin glargine  12  Units Subcutaneous BID  . mouth rinse  15 mL Mouth Rinse 10 times per day  . melatonin  3 mg Per Tube QHS  . midodrine  20 mg Per Tube Q8H  . neomycin-polymyxin-hydrocortisone  3 drop Left EAR Q12H  . pantoprazole sodium  40 mg Per Tube BID  . QUEtiapine  25 mg Per Tube BID  . sevelamer carbonate  0.8 g Per Tube TID WC  . sodium chloride flush  10-40 mL Intracatheter Q12H    Infusions: .  prismasol BGK 4/2.5 500 mL/hr at 05/26/19 0406  .  prismasol BGK 4/2.5 300 mL/hr at 05/25/19 1559  . sodium chloride Stopped (05/25/19 2332)  . sodium chloride    . ceFEPime (MAXIPIME) IV Stopped (05/25/19 2142)  . feeding supplement (VITAL 1.5 CAL) 1,000 mL (05/26/19 0043)  . norepinephrine (LEVOPHED) Adult infusion 10 mcg/min (05/26/19 0700)  . prismasol BGK 4/2.5 1,500 mL/hr at 05/26/19 0537  . vancomycin Stopped (05/25/19 1724)    PRN Medications: Place/Maintain arterial line **AND** sodium chloride, sodium chloride, alum & mag hydroxide-simeth, dextrose, diphenhydrAMINE, fentaNYL (SUBLIMAZE) injection, Gerhardt's butt cream, heparin, heparin, levalbuterol, ondansetron (ZOFRAN) IV, oxyCODONE, pneumococcal 23 valent vaccine, sodium chloride flush, sorbitol     Assessment/Plan   1. Acute systolic HF ->  Cardiogenic Shock  - Due to iCM. EF 20-25% - Impella 5.5 placed on 4/2.   - s/p CABG/MVRepair on 4/6  - Impella out 4/9.  - Echo 4/10 with EF 25-30%, normal RV, no MR.  - Echo 4/22 EF 30-35% mildly reduced RV MVR ok   - CO-OX 53% on Norepi 10 mcg + midodrine 20 mg tid.   2. 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 Echo stable - No s/s ischemia - On ASA/Crestor  3. Acute hypoxic respiratory failure - Due to pulmonary edema and PNA - Extubated 4/9 re-intubated on 4/12. Failed re-extubation on 4/14. Now s/p tracheostom - S/P Bronch 4/27 - 60K yeast. -> ? Colonization.  - CT scan 5/7 with diffuse bilateral infiltrates c/w ARDS and large bilateral  effusions  R>L.  - R chest tube placed with > 1L out. Can tap left effusion as needed - Remains on vent FiO2 40% - 4. Mitral Regurgitation - Mod-severe on ECHO - s/p MV repair, TEE 4/9 with minimal MR s/p repair.   - Echo 4/10 with no significant MR.  - echo 4/22 MVR stable  5. Uncontrolled DM - Hgb A1C 9.  - On insulin and sliding scale.  - No change  6. AKI - due to shock - Remains anuric post-op - Continue CVVHD per Nephrology. Management as above  7. Acute blood loss Anemia  - Transfused 1UPRBCs 05/25/19 Hgb 7.1>8.1.  - No obvious bleeding.   8. Polymorphic VT - Recurrent VT during respiratory distress 4/14, - Off amio  - No further VT.  - Keep K> 4.0 Mg > 2.0  - no change  9. Severe Malnutrition - Prealbumin improved, up from 8.9>>18.7 - Nutrition on board - Continue w/ TFs - Wil likely need PEG  10. Atrial fibrillation. paroxysmal - Remain in NSR  - Amiodarone was stopped.   72. Debility, severe - as above will likely need LTAC on d/c  12. Chronic pain issues - pain meds being tapered  Amy Clegg NP-C  7:11 AM   Agree with above.  Remains critically ill. Now back on vent and CRRT. Co-ox dropped into 20-30s over the weekend. Now in low 50s on NE 10. Weak and lethargic.   General: cachetic. Ill appearing HEENT: normal Neck: supple. no JVD. + trach  Carotids 2+ bilat; no bruits. No lymphadenopathy or thryomegaly appreciated. Cor: PMI nondisplaced. Regular rate & rhythm. No rubs, gallops or murmurs. + tunneled cath Lungs: coarse. Dull at basese Abdomen: soft, nontender, nondistended. No hepatosplenomegaly. No bruits or masses. Good bowel sounds. Extremities: no cyanosis, clubbing, rash, tr-1+ edema Neuro: lethargic. Will arouse  moves all 4 extremities w/o difficulty. Affect pleasant  He remains critically ill with MSOF. Co-ox dropped precipitously over the weekend. Now back up some on NE. Will repeat limited echo today. I worry we may be nearing a Hospice  situation if there is not some progress soon.   CRITICAL CARE Performed by: Glori Bickers  Total critical care time: 35 minutes  Critical care time was exclusive of separately billable procedures and treating other patients.  Critical care was necessary to treat or prevent imminent or life-threatening deterioration.  Critical care was time spent personally by me (independent of midlevel providers or residents) on the following activities: development of treatment plan with patient and/or surrogate as well as nursing, discussions with consultants, evaluation of patient's response to treatment, examination of patient, obtaining history from patient or surrogate, ordering and performing treatments and interventions, ordering and review of laboratory studies, ordering and review of radiographic studies, pulse oximetry and re-evaluation of patient's condition.  Glori Bickers, MD  10:57 AM

## 2019-05-26 NOTE — Progress Notes (Signed)
24 Days Post-Op Procedure(s) (LRB): VIDEO BRONCHOSCOPY USING DISPOSABLE ANESTHESIA SCOPE (N/A) TRACHEOSTOMY (N/A) Subjective: More alert- worked with PT Tolerating PS vent trials nsr CXR with dense bilat infiltrates but no sig effusions  Objective: Vital signs in last 24 hours: Temp:  [94.3 F (34.6 C)-97.9 F (36.6 C)] 94.3 F (34.6 C) (05/10 0447) Pulse Rate:  [70-111] 84 (05/10 1000) Cardiac Rhythm: Normal sinus rhythm (05/10 0800) Resp:  [14-30] 24 (05/10 1000) BP: (121-141)/(46-57) 128/56 (05/10 0400) SpO2:  [94 %-100 %] 100 % (05/10 1000) Arterial Line BP: (107-153)/(40-65) 128/50 (05/10 1000) FiO2 (%):  [40 %] 40 % (05/10 0746) Weight:  [55.3 kg] 55.3 kg (05/10 0500)  Hemodynamic parameters for last 24 hours: CVP:  [4 mmHg-18 mmHg] 8 mmHg  Intake/Output from previous day: 05/09 0701 - 05/10 0700 In: 2949.1 [I.V.:230.2; Blood:315; NG/GT:1670; IV Piggyback:733.9] Out: 3951 [Chest Tube:510] Intake/Output this shift: Total I/O In: 222.8 [I.V.:28.8; NG/GT:180; IV Piggyback:14.1] Out: 349 [Other:349]  EXAM bilat coarse breath sounds Sternal incision clean , dry extrem pink warm  Lab Results: Recent Labs    05/25/19 0400 05/25/19 0437 05/26/19 0444 05/26/19 0445  WBC 8.5  --   --  9.8  HGB 7.1*   < > 9.5* 8.1*  HCT 23.6*   < > 28.0* 26.2*  PLT 157  --   --  193   < > = values in this interval not displayed.   BMET:  Recent Labs    05/25/19 1605 05/25/19 1614 05/26/19 0444 05/26/19 0445  NA 136   < > 138 138  K 5.0   < > 4.5 4.6  CL 101  --   --  102  CO2 25  --   --  25  GLUCOSE 180*  --   --  78  BUN 30*  --   --  28*  CREATININE 1.20  --   --  1.03  CALCIUM 8.9  --   --  9.1   < > = values in this interval not displayed.    PT/INR: No results for input(s): LABPROT, INR in the last 72 hours. ABG    Component Value Date/Time   PHART 7.434 05/26/2019 0444   HCO3 26.2 05/26/2019 0444   TCO2 27 05/26/2019 0444   ACIDBASEDEF 3.0 (H) 04/28/2019  0735   O2SAT 53.1 05/26/2019 0445   CBG (last 3)  Recent Labs    05/25/19 2359 05/26/19 0442 05/26/19 0746  GLUCAP 98 75 85    Assessment/Plan: S/P Procedure(s) (LRB): VIDEO BRONCHOSCOPY USING DISPOSABLE ANESTHESIA SCOPE (N/A) TRACHEOSTOMY (N/A) Finish coarse of Maxepime for enterobacter in tracheal washing PS vent weaning Wean norepi to keep MAP 60   LOS: 42 days    [name] 05/26/2019

## 2019-05-26 NOTE — Progress Notes (Addendum)
T[facility] DAILY ICU PROGRESS NOTE                   [address]            ,[place] [address]          [phone]   24 Days Post-Op Procedure(s) (LRB): VIDEO BRONCHOSCOPY USING DISPOSABLE ANESTHESIA SCOPE (N/A) TRACHEOSTOMY (N/A)  Total Length of Stay:  LOS: 42 days   Subjective: Patient appears drowsy/lethargic like this am  Objective: Vital signs in last 24 hours: Temp:  [94.3 F (34.6 C)-97.9 F (36.6 C)] 94.3 F (34.6 C) (05/10 0447) Pulse Rate:  [70-111] 78 (05/10 0700) Cardiac Rhythm: Normal sinus rhythm (05/10 0400) Resp:  [13-30] 18 (05/10 0700) BP: (121-141)/(46-57) 128/56 (05/10 0400) SpO2:  [93 %-100 %] 100 % (05/10 0700) Arterial Line BP: (111-153)/(43-65) 142/55 (05/10 0700) FiO2 (%):  [40 %] 40 % (05/10 0447) Weight:  [55.3 kg] 55.3 kg (05/10 0500)  Filed Weights   05/24/19 0500 05/25/19 0545 05/26/19 0500  Weight: 58.2 kg 60.1 kg 55.3 kg    Weight change: -4.8 kg   CVP:  [6 mmHg-18 mmHg] 6 mmHg  Intake/Output from previous day: 05/09 0701 - 05/10 0700 In: 2949.1 [I.V.:230.2; Blood:315; NG/GT:1670; IV Piggyback:733.9] Out: 9983 [Chest Tube:510]  Intake/Output this shift: No intake/output data recorded.  Current Meds: Scheduled Meds: . aspirin EC  325 mg Oral Daily   Or  . aspirin  324 mg Per Tube Daily  . B-complex with vitamin C  1 tablet Per Tube Daily  . bisacodyl  10 mg Rectal Once  . chlorhexidine gluconate (MEDLINE KIT)  15 mL Mouth Rinse BID  . Chlorhexidine Gluconate Cloth  6 each Topical Q0600  . citalopram  10 mg Per Tube Daily  . docusate  200 mg Per Tube Daily  . feeding supplement (PRO-STAT SUGAR FREE 64)  30 mL Per Tube BID  . gabapentin  100 mg Oral Q8H  . insulin aspart  1 Units Subcutaneous Q4H  . insulin glargine  8 Units Subcutaneous BID  . mouth rinse  15 mL Mouth Rinse 10 times per day  . melatonin  3 mg Per Tube QHS  . midodrine  20 mg Per Tube Q8H  . neomycin-polymyxin-hydrocortisone  3 drop Left EAR  Q12H  . pantoprazole sodium  40 mg Per Tube BID  . QUEtiapine  25 mg Oral QHS  . sevelamer carbonate  0.8 g Per Tube TID WC  . sodium chloride flush  10-40 mL Intracatheter Q12H   Continuous Infusions: .  prismasol BGK 4/2.5 500 mL/hr at 05/26/19 0406  .  prismasol BGK 4/2.5 300 mL/hr at 05/25/19 1559  . sodium chloride Stopped (05/25/19 2332)  . sodium chloride    . ceFEPime (MAXIPIME) IV Stopped (05/25/19 2142)  . feeding supplement (VITAL 1.5 CAL) 1,000 mL (05/26/19 0043)  . norepinephrine (LEVOPHED) Adult infusion 10 mcg/min (05/26/19 0700)  . prismasol BGK 4/2.5 1,500 mL/hr at 05/26/19 0537   PRN Meds:.Place/Maintain arterial line **AND** sodium chloride, sodium chloride, alum & mag hydroxide-simeth, dextrose, diphenhydrAMINE, fentaNYL (SUBLIMAZE) injection, Gerhardt's butt cream, heparin, heparin, levalbuterol, ondansetron (ZOFRAN) IV, oxyCODONE, pneumococcal 23 valent vaccine, sodium chloride flush, sorbitol  Heart: RRR Lungs: Less rhonchus than previously Abdomen: Soft,  bowel sounds present Extremities: No LE edema Wound: Clean and dry.  Right chest tube: Yellow pleural fluid draining  Lab Results: CBC: Recent Labs    05/25/19 0400 05/25/19 0437 05/26/19 0444 05/26/19 0445  WBC 8.5  --   --  9.8  HGB 7.1*   < > 9.5* 8.1*  HCT 23.6*   < > 28.0* 26.2*  PLT 157  --   --  193   < > = values in this interval not displayed.   BMET:  Recent Labs    05/25/19 1605 05/25/19 1614 05/26/19 0444 05/26/19 0445  NA 136   < > 138 138  K 5.0   < > 4.5 4.6  CL 101  --   --  102  CO2 25  --   --  25  GLUCOSE 180*  --   --  78  BUN 30*  --   --  28*  CREATININE 1.20  --   --  1.03  CALCIUM 8.9  --   --  9.1   < > = values in this interval not displayed.    CMET: Lab Results  Component Value Date   WBC 9.8 05/26/2019   HGB 8.1 (L) 05/26/2019   HCT 26.2 (L) 05/26/2019   PLT 193 05/26/2019   GLUCOSE 78 05/26/2019   CHOL 174 08/21/2017   TRIG 143 08/21/2017   HDL 57  08/21/2017   LDLCALC 88 08/21/2017   ALT 56 (H) 05/15/2019   AST 44 (H) 05/15/2019   NA 138 05/26/2019   K 4.6 05/26/2019   CL 102 05/26/2019   CREATININE 1.03 05/26/2019   BUN 28 (H) 05/26/2019   CO2 25 05/26/2019   TSH 0.925 03/21/2019   INR 1.6 (H) 05/01/2019   HGBA1C 9.0 (H) 04/13/2019   MICROALBUR 30 08/21/2017    PT/INR:  No results for input(s): LABPROT, INR in the last 72 hours. Radiology: No results found.  Assessment/Plan: S/P Procedure(s) (LRB): VIDEO BRONCHOSCOPY USING DISPOSABLE ANESTHESIA SCOPE (N/A) TRACHEOSTOMY (N/A)  1. CV-S/p removal of Impella on 04/09.  S/p V tach arrest 04/15. Previous a fib. SR with HR in the 80's this am.  On  Midodrine 20 mg tid and Nor epinephrine 10 mcg/min.Marland Kitchen Receiving intermittent CVVHD and remains pressor dependent.   Co ox this am slightly increased to 53.1 (has been off Milrinone).  2. Pulmonary-S/p trach 04/16. ABG results noted. Right chest tube with 510 last 24 hours. CXR this am appears to show patient rotated to the left, diffuse bilateral interstitial and alveolar airspace disease, no pneumothorax, small right pleural effusion. 3. Expected post op blood loss anemia-H and H yesterday slightly increased to 8.1 and 26.2 4. DM-CBGs 89/98/75. On Insulin. He is on Insulin and this has been increased for better glucose control over the last 2 days. He was on Metformin 1000 mg bid prior to surgery, but will continue on Insulin for now as NPO. Pre op HGA1C 9. He will need close medical follow up after discharge 5. Oliguric/anuric AKI-Creatinine decreased to 1.03 this am. Nephrology following and arranging for CVVHD accordingly 6. GI-severe malnutrition of chronic illness.  Cortrak, TFs. Speech pathology evaluation done yesterday and recommendations to be followed accordingly. Albumin 3.9 7. Acute systolic heart failure-CVVHD helping with volume status 8. Extremely deconditioned-will need PT as able. He did not want to participate with PT  yesterday but did do approved bed exercises  [name] PA-C 05/26/2019 7:30 AM   patient examined and medical record reviewed,agree with above note. [name] 05/26/2019

## 2019-05-26 NOTE — Progress Notes (Signed)
Pharmacy Antibiotic Note  Brendan [name] is a 64 y.o. male admitted on 04/11/2019 with severe 3 vessel disease s/p CABG on 4/6 who was re-intubated 4/12 and trached 4/14 has completed a course of antibiotics this stay and now with concern for a new pneumonia.  Pharmacy has been consulted for Vancomycin dosing. Cefepime was started 5/6. Vancomycin stopped by CCM 5/10.   WBC is within normal limits and patient is afebrile. Patient is requiring levophed at 10 mcg/min + Midodrine.  Patient has been off CRRT since 4/29 and on HD. Last HD was on 5/5, then back to CRRT.   Plan: Continue Cefepime 2g q 12hr Monitor renal plans, culture results, and clinical status.   Height: _0  (180.3 cm)(measured x 3) Weight: 55.3 kg (121 lb 14.6 oz)(O2 tank found on bed- taken off. Weight with one pillow) IBW/kg (Calculated) : 75.3  Temp (24hrs), Avg:96.7 F (35.9 C), Min:94.3 F (34.6 C), Max:97.9 F (36.6 C)  Recent Labs  Lab 05/21/19 0359 05/21/19 0359 05/22/19 0402 05/23/19 0333 05/24/19 0539 05/24/19 1957 05/25/19 0400 05/25/19 1605 05/26/19 0445  WBC 9.6  --  10.0  --  9.8  --  8.5  --  9.8  CREATININE 3.54*   < > 2.29*   < > 2.30* 1.57* 1.36* 1.20 1.03   < > = values in this interval not displayed.    Estimated Creatinine Clearance: 58.9 mL/min (by C-G formula based on SCr of 1.03 mg/dL).    Allergies  Allergen Reactions  . Acetaminophen Itching    Antimicrobials this admission: Cefepime 4/4>4/13 Vanc 4/4>4/4; resume 4/12 > 4/15; 5/7 >> Rocephin 4/4 > 4/6 Ampicillin 4/12>4/12 Unasyn 4/16 >>4/20 Zosyn 4/20>>4/26 Linezolid 4/20>>4/23 Fluconazole 4/28>>5/4  Dose adjustments this admission:   Microbiology results: 4/2 R pleural fluid > neg 4/4 BCx x 2 > neg 4/9 BCx - negative 4/9 Wound Cx > rare enterococcus faecalis (pan-S) 4/9 UCx >  > 100K yeast 4/19 BAL - rare E.faecalis (pan sensitive) 4/12 TA > ngF 4/16 BAL > ngF 4/20 TA > rare candida - not treating per  ID 4/20 BCx > ngF 4/21 TA > rare candida - not treating per ID 4/27 BAL > 60k yeast 4/29 Trach asp > nf 5/6 Resp Cx > few GNR- enterobacter cloacae - R ancef, S cefepime 5/7 pleural - ngtd 5/7 bldx2 - ngtd   Thank you for allowing pharmacy to be a part of this patient's care.   Marguerite Olea, [facility] Clinical Pharmacist Phone [phone]  05/26/2019 7:35 AM

## 2019-05-26 NOTE — Progress Notes (Signed)
Patient ID: Brendan [name], male   DOB: [birth year], 64 y.o.   MRN: [id number] S: No significant events overnight- remains on pressors- no UOP- pulled 898 with CRRT   O:BP (!) 128/56 (BP Location: Other (Comment)) Comment (BP Location): aline  Pulse 76   Temp (!) 94.3 F (34.6 C) (Oral) Comment: bear hugger applied  Resp (!) 27   Ht '5\' 11"'$  (1.803 m) Comment: measured x 3  Wt 55.3 kg Comment: O2 tank found on bed- taken off. Weight with one pillow  SpO2 100%   BMI 17.00 kg/m   Intake/Output Summary (Last 24 hours) at 05/26/2019 0539 Last data filed at 05/26/2019 0600 Gross per 24 hour  Intake 2942.9 ml  Output 3930 ml  Net -987.1 ml   Intake/Output: I/O last 3 completed shifts: In: 4184 [I.V.:294.2; Blood:315; NG/GT:2410; IV Piggyback:1164.7] Out: 7673 [ALPFX:9024; Chest Tube:740]  Intake/Output this shift:  Total I/O In: 1325.1 [I.V.:141.4; NG/GT:880; IV Piggyback:303.7] Out: 1655 [Other:1395; Chest Tube:260] Weight change: -4.8 kg Gen: frail, cachectic WM on vent via trach- left IJ vascath placed 5/3 CVS: RRR Resp: decreased BS at bases Abd: +BS, soft, NT/ND Ext: no edema- atrophied legs   Recent Labs  Lab 05/23/19 0333 05/23/19 0534 05/23/19 1624 05/23/19 1658 05/24/19 0539 05/24/19 1849 05/24/19 1957 05/25/19 0400 05/25/19 0437 05/25/19 1605 05/25/19 1614 05/26/19 0444 05/26/19 0445  NA 131*   < > 131*   < > 134*   < > 132* 135 135 136 136 138 138  K 4.2   < > 4.9   < > 5.2*   < > 5.5* 5.3* 5.3* 5.0 4.9 4.5 4.6  CL 95*  --  93*  --  98  --  98 99  --  101  --   --  102  CO2 26  --  23  --  24  --  23 25  --  25  --   --  25  GLUCOSE 165*  --  114*  --  95  --  171* 213*  --  180*  --   --  78  BUN 74*  --  91*  --  53*  --  39* 35*  --  30*  --   --  28*  CREATININE 3.44*  --  4.06*  --  2.30*  --  1.57* 1.36*  --  1.20  --   --  1.03  ALBUMIN 2.1*  --  1.9*  --  1.9*  --  2.6* 3.2*  --  3.3*  --   --  3.9  CALCIUM 8.7*  --  8.9  --  8.4*  --  8.3* 8.9  --   8.9  --   --  9.1  PHOS 5.6*  --  3.7  --  3.1  --  3.5 3.6  --  2.8  --   --  2.1*   < > = values in this interval not displayed.   Liver Function Tests: Recent Labs  Lab 05/25/19 0400 05/25/19 1605 05/26/19 0445  ALBUMIN 3.2* 3.3* 3.9   No results for input(s): LIPASE, AMYLASE in the last 168 hours. No results for input(s): AMMONIA in the last 168 hours. CBC: Recent Labs  Lab 05/21/19 0359 05/21/19 0359 05/22/19 0402 05/22/19 0827 05/24/19 0539 05/24/19 1849 05/25/19 0400 05/25/19 0437 05/25/19 1614 05/26/19 0444 05/26/19 0445  WBC 9.6   < > 10.0  --  9.8  --  8.5  --   --   --  9.8  HGB 9.0*   < > 8.6*   < > 8.1*   < > 7.1*   < > 9.5* 9.5* 8.1*  HCT 29.1*   < > 28.3*   < > 26.1*   < > 23.6*   < > 28.0* 28.0* 26.2*  MCV 99.0  --  100.7*  --  99.2  --  100.0  --   --   --  96.7  PLT 142*   < > 144*  --  162  --  157  --   --   --  193   < > = values in this interval not displayed.   Cardiac Enzymes: No results for input(s): CKTOTAL, CKMB, CKMBINDEX, TROPONINI in the last 168 hours. CBG: Recent Labs  Lab 05/25/19 1235 05/25/19 1604 05/25/19 1957 05/25/19 2359 05/26/19 0442  GLUCAP 189* 163* 89 98 75    Iron Studies: No results for input(s): IRON, TIBC, TRANSFERRIN, FERRITIN in the last 72 hours. Studies/Results: No results found. Marland Kitchen aspirin EC  325 mg Oral Daily   Or  . aspirin  324 mg Per Tube Daily  . B-complex with vitamin C  1 tablet Per Tube Daily  . chlorhexidine gluconate (MEDLINE KIT)  15 mL Mouth Rinse BID  . Chlorhexidine Gluconate Cloth  6 each Topical Q0600  . citalopram  10 mg Per Tube Daily  . docusate  200 mg Per Tube Daily  . feeding supplement (PRO-STAT SUGAR FREE 64)  30 mL Per Tube BID  . gabapentin  100 mg Oral Q8H  . insulin aspart  1 Units Subcutaneous Q4H  . insulin glargine  12 Units Subcutaneous BID  . mouth rinse  15 mL Mouth Rinse 10 times per day  . melatonin  3 mg Per Tube QHS  . midodrine  20 mg Per Tube Q8H  .  neomycin-polymyxin-hydrocortisone  3 drop Left EAR Q12H  . pantoprazole sodium  40 mg Per Tube BID  . QUEtiapine  25 mg Per Tube BID  . sevelamer carbonate  0.8 g Per Tube TID WC  . sodium chloride flush  10-40 mL Intracatheter Q12H    BMET    Component Value Date/Time   NA 138 05/26/2019 0445   K 4.6 05/26/2019 0445   CL 102 05/26/2019 0445   CO2 25 05/26/2019 0445   GLUCOSE 78 05/26/2019 0445   BUN 28 (H) 05/26/2019 0445   CREATININE 1.03 05/26/2019 0445   CALCIUM 9.1 05/26/2019 0445   GFRNONAA >60 05/26/2019 0445   GFRAA >60 05/26/2019 0445   CBC    Component Value Date/Time   WBC 9.8 05/26/2019 0445   RBC 2.71 (L) 05/26/2019 0445   HGB 8.1 (L) 05/26/2019 0445   HCT 26.2 (L) 05/26/2019 0445   PLT 193 05/26/2019 0445   MCV 96.7 05/26/2019 0445   MCH 29.9 05/26/2019 0445   MCHC 30.9 05/26/2019 0445   RDW 18.0 (H) 05/26/2019 0445   LYMPHSABS 1.1 05/14/2019 0402   MONOABS 1.1 (H) 05/13/2019 0402   EOSABS 0.2 04/30/2019 0402   BASOSABS 0.0 04/20/2019 0402    Brief HPI: admitted to outside hospital on 3/26 with SOB, new low EF and NSTEMI txd to [facility] 3/29: 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 Admission Scr was 0.99.  Assessment/Plan:  1. Acute hypoxic respiratory failure on 05/23/19- worsening infiltrates on CT and bilateral pleural effusions.  S/p  right chest tube    2. Oliguric/anuricAKI- CRRT started on 4/10 and taken off 4/29. Transitioned to IHD on 05/17/19 but failed and now back on CRRT 1. Remains oliguric/anuric. 2. Resumed CRRT on 05/23/19 following acute worsening of his oxygenation and hypotension.   3. Will keep even in light of pressor support and chest tube with drainage of pleural effusion on the right.  Still with pleural effusion on the left.  Pleural effusions likely due to hypoalbuminemia. 4. Dialysate 4K/2.5 Ca for replacement fluids  and dialysate.  no heparin.  UF of 50 per hour.  No changes to CRRT today    3. Vascular access- leftIJ tunneled HD catheter placed by IR on 05/19/19 4. CAD- 3 vessel with EF 25% s/p CABG and MVR 01/19/58 complicated by cardiogenic shock 5. Acute systolic CHF and cardiogenic shock- still on pressorswith escalating requirements. Volume improved with CRRT/IHD.  On pretty max midodrine as well  6. Acute hypoxic respiratory failure- s/p trachback on vent. Pulmonary infiltrates worsening on CXR. Discussed with PCCM and plan for CT scan of chest and likely repeat bronch. 7. DM- per primary 8. MR s/p MVR 9. Polymorphic VT- off amio 10. Severe debility- will need LTACbut needs to remain off of pressors. [place] meeting being discussed   Brendan [name]  [employer] [phone]

## 2019-05-27 ENCOUNTER — Inpatient Hospital Stay (HOSPITAL_COMMUNITY): Payer: Medicaid Other

## 2019-05-27 LAB — RENAL FUNCTION PANEL
Albumin: 3.2 g/dL — ABNORMAL LOW (ref 3.5–5.0)
Albumin: 3 g/dL — ABNORMAL LOW (ref 3.5–5.0)
Anion gap: 11 (ref 5–15)
Anion gap: 11 (ref 5–15)
BUN: 28 mg/dL — ABNORMAL HIGH (ref 8–23)
BUN: 29 mg/dL — ABNORMAL HIGH (ref 8–23)
CO2: 24 mmol/L (ref 22–32)
CO2: 25 mmol/L (ref 22–32)
Calcium: 9.1 mg/dL (ref 8.9–10.3)
Calcium: 8.5 mg/dL — ABNORMAL LOW (ref 8.9–10.3)
Chloride: 102 mmol/L (ref 98–111)
Chloride: 100 mmol/L (ref 98–111)
Creatinine, Ser: 1.15 mg/dL (ref 0.61–1.24)
Creatinine, Ser: 1.07 mg/dL (ref 0.61–1.24)
GFR calc Af Amer: >60 mL/min (ref >60)
GFR calc Af Amer: >60 mL/min (ref >60)
GFR calc non Af Amer: >60 mL/min (ref >60)
GFR calc non Af Amer: >60 mL/min (ref >60)
Glucose, Bld: 130 mg/dL — ABNORMAL HIGH (ref 70–99)
Glucose, Bld: 197 mg/dL — ABNORMAL HIGH (ref 70–99)
Phosphorus: 1.3 mg/dL — ABNORMAL LOW (ref 2.5–4.6)
Phosphorus: 3.7 mg/dL (ref 2.5–4.6)
Potassium: 4.5 mmol/L (ref 3.5–5.1)
Potassium: 4.9 mmol/L (ref 3.5–5.1)
Sodium: 137 mmol/L (ref 135–145)
Sodium: 136 mmol/L (ref 135–145)

## 2019-05-27 LAB — POCT I-STAT 7, (LYTES, BLD GAS, ICA,H+H)
Acid-Base Excess: 1 mmol/L (ref 0.0–2.0)
Bicarbonate: 26.7 mmol/L (ref 20.0–28.0)
Calcium, Ion: 1.24 mmol/L (ref 1.15–1.40)
HCT: 30 % — ABNORMAL LOW (ref 39.0–52.0)
Hemoglobin: 10.2 g/dL — ABNORMAL LOW (ref 13.0–17.0)
O2 Saturation: 99 %
Potassium: 4.8 mmol/L (ref 3.5–5.1)
Sodium: 137 mmol/L (ref 135–145)
TCO2: 28 mmol/L (ref 22–32)
pCO2 arterial: 44.8 mmHg (ref 32.0–48.0)
pH, Arterial: 7.384 (ref 7.350–7.450)
pO2, Arterial: 150 mmHg — ABNORMAL HIGH (ref 83.0–108.0)

## 2019-05-27 LAB — CBC
HCT: 29.5 % — ABNORMAL LOW (ref 39.0–52.0)
Hemoglobin: 9 g/dL — ABNORMAL LOW (ref 13.0–17.0)
MCH: 29.7 pg (ref 26.0–34.0)
MCHC: 30.5 g/dL (ref 30.0–36.0)
MCV: 97.4 fL (ref 80.0–100.0)
Platelets: 205 10*3/uL (ref 150–400)
RBC: 3.03 MIL/uL — ABNORMAL LOW (ref 4.22–5.81)
RDW: 17.4 % — ABNORMAL HIGH (ref 11.5–15.5)
WBC: 8.7 10*3/uL (ref 4.0–10.5)
nRBC: 0 % (ref 0.0–0.2)

## 2019-05-27 LAB — COOXEMETRY PANEL
Carboxyhemoglobin: 2.2 % — ABNORMAL HIGH (ref 0.5–1.5)
Carboxyhemoglobin: 1.1 % (ref 0.5–1.5)
Methemoglobin: 1.3 % (ref 0.0–1.5)
Methemoglobin: 0.5 % (ref 0.0–1.5)
O2 Saturation: 99.6 %
O2 Saturation: 58.2 %
Total hemoglobin: 9.7 g/dL — ABNORMAL LOW (ref 12.0–16.0)
Total hemoglobin: 17.5 g/dL — ABNORMAL HIGH (ref 12.0–16.0)

## 2019-05-27 LAB — HEPATIC FUNCTION PANEL
ALT: 15 U/L (ref 0–44)
AST: 29 U/L (ref 15–41)
Albumin: 3.2 g/dL — ABNORMAL LOW (ref 3.5–5.0)
Alkaline Phosphatase: 294 U/L — ABNORMAL HIGH (ref 38–126)
Bilirubin, Direct: 0.1 mg/dL (ref 0.0–0.2)
Indirect Bilirubin: 0.8 mg/dL (ref 0.3–0.9)
Total Bilirubin: 0.9 mg/dL (ref 0.3–1.2)
Total Protein: 7.1 g/dL (ref 6.5–8.1)

## 2019-05-27 LAB — GLUCOSE, CAPILLARY
Glucose-Capillary: 120 mg/dL — ABNORMAL HIGH (ref 70–99)
Glucose-Capillary: 134 mg/dL — ABNORMAL HIGH (ref 70–99)
Glucose-Capillary: 149 mg/dL — ABNORMAL HIGH (ref 70–99)
Glucose-Capillary: 181 mg/dL — ABNORMAL HIGH (ref 70–99)
Glucose-Capillary: 203 mg/dL — ABNORMAL HIGH (ref 70–99)
Glucose-Capillary: 88 mg/dL (ref 70–99)

## 2019-05-27 LAB — MAGNESIUM: Magnesium: 2.9 mg/dL — ABNORMAL HIGH (ref 1.7–2.4)

## 2019-05-27 MED ORDER — DARBEPOETIN ALFA 100 MCG/0.5ML IJ SOSY
100.0000 ug | PREFILLED_SYRINGE | INTRAMUSCULAR | Status: DC
  Administered 2019-05-27 – 2019-06-10 (×2): 100 ug via SUBCUTANEOUS
  Filled 2019-05-27 (×4): qty 0.5

## 2019-05-27 MED ORDER — ALBUMIN HUMAN 25 % IV SOLN
12.5000 g | Freq: Four times a day (QID) | INTRAVENOUS | Status: AC
  Administered 2019-05-27 (×2): 12.5 g via INTRAVENOUS
  Filled 2019-05-27 (×2): qty 50

## 2019-05-27 MED ORDER — SODIUM PHOSPHATES 45 MMOLE/15ML IV SOLN
30.0000 mmol | Freq: Once | INTRAVENOUS | Status: AC
  Administered 2019-05-27: 08:00:00 30 mmol via INTRAVENOUS
  Filled 2019-05-27: qty 10

## 2019-05-27 MED ORDER — SORBITOL 70 % SOLN
30.0000 mL | Freq: Once | Status: AC
  Administered 2019-05-27: 30 mL via ORAL
  Filled 2019-05-27: qty 30

## 2019-05-27 MED ORDER — POLYETHYLENE GLYCOL 3350 17 G PO PACK
17.0000 g | PACK | Freq: Every day | ORAL | Status: DC
  Administered 2019-05-28 – 2019-06-10 (×9): 17 g via ORAL
  Filled 2019-05-27 (×13): qty 1

## 2019-05-27 MED ORDER — BISACODYL 10 MG RE SUPP
10.0000 mg | Freq: Once | RECTAL | Status: AC
  Administered 2019-05-27: 10 mg via RECTAL
  Filled 2019-05-27: qty 1

## 2019-05-27 NOTE — Progress Notes (Signed)
Moshe Cipro MD notified of Prescott Gum MD wanting to keep patient even on CRRT and to give small bottle of albumin to hopefully facilitate weaning levophed gtt.

## 2019-05-27 NOTE — Progress Notes (Signed)
Patient ID: Brendan [name], male   DOB: 1955-11-29, 64 y.o.   MRN: [id number] S: No significant events overnight- hypothermic, remains on pressors- no UOP- pulled 1720 with CRRT   O:BP (!) 128/56 (BP Location: Other (Comment)) Comment (BP Location): aline  Pulse 87   Temp (!) 96.7 F (35.9 C) (Axillary)   Resp (!) 30   Ht 5' 11"  (1.803 m) Comment: measured x 3  Wt 55.8 kg   SpO2 100%   BMI 17.16 kg/m   Intake/Output Summary (Last 24 hours) at 05/27/2019 0635 Last data filed at 05/27/2019 0500 Gross per 24 hour  Intake 1182.43 ml  Output 3006 ml  Net -1823.57 ml   Intake/Output: I/O last 3 completed shifts: In: 3882 [I.V.:343; Blood:315; NG/GT:2390; IV Piggyback:833.9] Out: 7654 [YTKPT:4656; Chest Tube:630]  Intake/Output this shift:  Total I/O In: 180.2 [I.V.:80.2; IV Piggyback:100] Out: 1168 [Other:1168] Weight change: 0.5 kg Gen: frail, cachectic WM on vent via trach- left IJ vascath placed 5/3 CVS: RRR Resp: decreased BS at bases Abd: +BS, soft, NT/ND Ext: no edema- atrophied legs   Recent Labs  Lab 05/24/19 0539 05/24/19 1849 05/24/19 1957 05/24/19 1957 05/25/19 0400 05/25/19 0400 05/25/19 0437 05/25/19 1605 05/25/19 1614 05/26/19 0444 05/26/19 0445 05/26/19 1638 05/27/19 0530  NA 134*   < > 132*   < > 135   < > 135 136 136 138 138 137 137  K 5.2*   < > 5.5*   < > 5.3*   < > 5.3* 5.0 4.9 4.5 4.6 5.0 4.5  CL 98  --  98  --  99  --   --  101  --   --  102 101 102  CO2 24  --  23  --  25  --   --  25  --   --  25 27 24   GLUCOSE 95  --  171*  --  213*  --   --  180*  --   --  78 165* 130*  BUN 53*  --  39*  --  35*  --   --  30*  --   --  28* 26* 28*  CREATININE 2.30*  --  1.57*  --  1.36*  --   --  1.20  --   --  1.03 1.05 1.15  ALBUMIN 1.9*  --  2.6*  --  3.2*  --   --  3.3*  --   --  3.9 3.4* 3.2*  CALCIUM 8.4*  --  8.3*  --  8.9  --   --  8.9  --   --  9.1 8.8* 9.1  PHOS 3.1  --  3.5  --  3.6  --   --  2.8  --   --  2.1* 2.0* 1.3*   < > = values in this  interval not displayed.   Liver Function Tests: Recent Labs  Lab 05/26/19 0445 05/26/19 1638 05/27/19 0530  ALBUMIN 3.9 3.4* 3.2*   No results for input(s): LIPASE, AMYLASE in the last 168 hours. No results for input(s): AMMONIA in the last 168 hours. CBC: Recent Labs  Lab 05/22/19 0402 05/22/19 0827 05/24/19 0539 05/24/19 1849 05/25/19 0400 05/25/19 0437 05/26/19 0444 05/26/19 0445 05/27/19 0530  WBC 10.0  --  9.8  --  8.5  --   --  9.8 8.7  HGB 8.6*   < > 8.1*   < > 7.1*   < > 9.5* 8.1* 9.0*  HCT  28.3*   < > 26.1*   < > 23.6*   < > 28.0* 26.2* 29.5*  MCV 100.7*  --  99.2  --  100.0  --   --  96.7 97.4  PLT 144*  --  162  --  157  --   --  193 205   < > = values in this interval not displayed.   Cardiac Enzymes: No results for input(s): CKTOTAL, CKMB, CKMBINDEX, TROPONINI in the last 168 hours. CBG: Recent Labs  Lab 05/26/19 1130 05/26/19 1547 05/26/19 2014 05/27/19 0039 05/27/19 0422  GLUCAP 153* 139* 105* 120* 134*    Iron Studies: No results for input(s): IRON, TIBC, TRANSFERRIN, FERRITIN in the last 72 hours. Studies/Results: DG Chest Port 1 View  Result Date: 05/26/2019 CLINICAL DATA:  Cardiogenic shock EXAM: PORTABLE CHEST 1 VIEW COMPARISON:  May 24, 2019. FINDINGS: Tracheostomy catheter tip is 6.5 cm above the carina. Central catheter tip is at the cavoatrial junction. There is a feeding tube with tip below the diaphragm. Chest tube present on the right, unchanged in position. No evident pneumothorax. Widespread interstitial and alveolar opacities are again noted with increase in consolidation throughout the right mid and lower lung zones. Left lung appears essentially stable. There is cardiomegaly with pulmonary venous hypertension. Patient is status post coronary artery bypass grafting and mitral valve replacement. No adenopathy evident. No bone lesions. IMPRESSION: Tube and catheter positions as described without pneumothorax. Extensive interstitial and  airspace opacity bilaterally with increase in airspace opacity/consolidation in the right mid and lower lung zones compared to 2 days prior. Left lung unchanged. Suspect widespread pulmonary edema with potential developing pneumonia on the right. Stable cardiac prominence. Postoperative changes noted. Electronically Signed   By: Lowella Grip III M.D.   On: 05/26/2019 08:10   . aspirin EC  325 mg Oral Daily   Or  . aspirin  324 mg Per Tube Daily  . B-complex with vitamin C  1 tablet Per Tube Daily  . chlorhexidine gluconate (MEDLINE KIT)  15 mL Mouth Rinse BID  . Chlorhexidine Gluconate Cloth  6 each Topical Q0600  . citalopram  10 mg Per Tube Daily  . docusate  200 mg Per Tube Daily  . feeding supplement (PRO-STAT SUGAR FREE 64)  30 mL Per Tube BID  . gabapentin  100 mg Oral Q8H  . insulin aspart  1 Units Subcutaneous Q4H  . insulin glargine  8 Units Subcutaneous BID  . mouth rinse  15 mL Mouth Rinse 10 times per day  . melatonin  3 mg Per Tube QHS  . midodrine  20 mg Per Tube Q8H  . neomycin-polymyxin-hydrocortisone  3 drop Left EAR Q12H  . pantoprazole sodium  40 mg Per Tube BID  . QUEtiapine  25 mg Oral QHS  . rOPINIRole  0.5 mg Oral QHS  . sennosides  5 mL Per Tube BID  . sevelamer carbonate  0.8 g Per Tube TID WC  . sodium chloride flush  10-40 mL Intracatheter Q12H    BMET    Component Value Date/Time   NA 137 05/27/2019 0530   K 4.5 05/27/2019 0530   CL 102 05/27/2019 0530   CO2 24 05/27/2019 0530   GLUCOSE 130 (H) 05/27/2019 0530   BUN 28 (H) 05/27/2019 0530   CREATININE 1.15 05/27/2019 0530   CALCIUM 9.1 05/27/2019 0530   GFRNONAA >60 05/27/2019 0530   GFRAA >60 05/27/2019 0530   CBC    Component Value Date/Time  WBC 8.7 05/27/2019 0530   RBC 3.03 (L) 05/27/2019 0530   HGB 9.0 (L) 05/27/2019 0530   HCT 29.5 (L) 05/27/2019 0530   PLT 205 05/27/2019 0530   MCV 97.4 05/27/2019 0530   MCH 29.7 05/27/2019 0530   MCHC 30.5 05/27/2019 0530   RDW 17.4 (H)  05/27/2019 0530   LYMPHSABS 1.1 04/30/2019 0402   MONOABS 1.1 (H) 05/01/2019 0402   EOSABS 0.2 05/14/2019 0402   BASOSABS 0.0 04/27/2019 0402    Brief HPI: admitted to outside hospital on 3/26 with SOB, new low EF and NSTEMI txd to [facility] 3/29: 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 Admission Scr was 0.99.  Assessment/Plan:  1. Acute hypoxic respiratory failure on 05/23/19- worsening infiltrates on CT and bilateral pleural effusions.  S/p right chest tube    2. Oliguric/anuricAKI- CRRT started on 4/10 and stopped 4/29. Transitioned to IHD on 05/17/19 but failed and now back on CRRT 1. Remains oliguric/anuric. 2. Resumed CRRT on 05/23/19 following acute worsening of his oxygenation and hypotension.    3. Dialysate 4K/2.5 Ca for replacement fluids and dialysate.  no heparin.  UF of 50 per hour.  No changes to CRRT today    3. Vascular access- leftIJ tunneled HD catheter placed by IR on 05/19/19- now 56 days old 4. Anemia- pretty stable after one unit on 5/9- will add ESA  5. CAD- 3 vessel with EF 25% s/p CABG and MVR 07/24/01 complicated by cardiogenic shock 6. Acute systolic CHF and cardiogenic shock- still on pressors. Volume improved with CRRT/IHD.  On pretty max midodrine as well  7. [place]-  Needs phos repleted -  K is OK   8. DM- per primary 9. 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 Severe debility- will need LTAC if survives. [place] meeting possibly in the works  [place] [phone]

## 2019-05-27 NOTE — Progress Notes (Addendum)
PALLIATIVE NOTE:  Consult received from CCM for [place] and establishment of rapport with patient/family. Chart reviewed. Patient prolonged hospitalization (LOS 42 days). Updates received from [place], [place].   Request made for Dr. Nils Pyle to attend [place] meeting.   I was able to call and speak with patient's wife Cecille Rubin) and daughter Lawerance Bach). I introduced myself and Palliative's role in Mr. Olden care while hospitalized. Wife verbalized understanding and expressed appreciation.   Mrs. Budai reports both she and her daughter will be available and here to met at 2pm today. RN made aware of time with request to communicate with Dr. Nils Pyle. Secured message also sent with meeting time. Time confirmed with family and they are aware I will meet them at the bedside.   Detailed note and recommendations to follow.   Thank you for your referral and allowing Palliative to assist in Mr. Graffam care.   Alda Lea, AGPCNP-BC Palliative Medicine Team  Phone: [phone] Pager: [phone] Amion: N. Cousar   No CHARGE

## 2019-05-27 NOTE — Progress Notes (Signed)
Notified Prescott Gum MD of palliative meeting at 1400 with the family.

## 2019-05-27 NOTE — Progress Notes (Signed)
Nutrition Follow-up  DOCUMENTATION CODES:   Severe malnutrition in context of chronic illness, Underweight  INTERVENTION:   Recommend considering PEG tube placement for long term nutrition support. However, given nationwide shortage of PEG tubes, it may be a while before pt could have one placed.   Tube Feeding via Cortrak:  Change to Vital 1.5 at 60 ml/hr Pro-Stat 30 mL BID Provides 127 g of protein, 2360 kcals, 1094 mL of free water Meets 100% estimated calorie and protein needs  Continue B-complex with C  Recommend holding Renvela given hypophosphatemia (phosphorus 1.3 today and receiving supplementation)   NUTRITION DIAGNOSIS:   Severe Malnutrition related to chronic illness as evidenced by severe muscle depletion, severe fat depletion.  Being addressed via TF   GOAL:   Patient will meet greater than or equal to 90% of their needs  Progressing  MONITOR:   Vent status, Diet advancement, Labs, Weight trends, TF tolerance  REASON FOR ASSESSMENT:   Consult Poor PO, Assessment of nutrition requirement/status  ASSESSMENT:   64 yo male admitted with acute systeolic CHF, CAD with 3 vessel disease with plan for CABG. PMH includes DM with HgbA1c 9 with neuropathy, HTN   3/30 Cardiac Cath with severe 3-vessel CAD 3/31 ECHO: EF 30-35% 4/01 IABP placed, Intubated 4/02 Impella placed, bilateral CT placed for pleural effusions 4/04 Extubated 4/06 CABG, MV repair, Intubated 4/07 Extubated 4/09 Impella removed, LVAD placed, Cortrak placed, TF initiated 4/10 CRRT initiated 4/12 Re-intubated 4/14 Extubated 4/15 Re-intubated 4/16 Trach, Bronchoscopy 4/27 Bronch 4/29 CRRT discontinued 4/30 off vent, TC x 24 hours 5/01 1st iHD 5/07 back on CRRT and vent support  Noted palliative care has been consulted with plans for family meeting at 39 today  Pt awake on vent support via trach. Remains on CRRT. Requiring levophed  Vital 1.5 at 60 ml/hr via Cortrak  tube  Noted phosphorus <2.0, supplemented today. Pt also on phosphorus binder and has been receiving per tube. Recommend discontinuing at this time; discussed with Nephrologist  Noted pt not currently on sliding scale insulin; receiving 1 unit of novolog q 4 hours, lantus BID. Plan to reach out to DM Coordinator given CBG >200 today to discuss insulin regimen  Labs: phosphorus 1.3 (L), CBGs 85-203 Meds: sodium phosphate, renvela with meals, novolog 1 unit q 4 hours, lantus BID  Diet Order:   Diet Order    None      EDUCATION NEEDS:   Education needs have been addressed  Skin:  Skin Assessment: Skin Integrity Issues: Skin Integrity Issues:: Stage II, DTI DTI: Coccyx Stage II: perineum Incisions: R axilla, chest, bilateral legs, neck  Last BM:  5/11 (rectal tube has been removed)  Height:   Ht Readings from Last 1 Encounters:  05/08/2019 5\' 11"  (1.803 m)    Weight:   Wt Readings from Last 1 Encounters:  05/27/19 55.8 kg    BMI:  Body mass index is 17.16 kg/m.  Estimated Nutritional Needs:   Kcal:  2100-2300 kcals  Protein:  120-135 g  Fluid:  >/= 1.8 L/day    Kerman Passey MS, RDN, LDN, CNSC RD Pager Number and RD On-Call Pager Number Located in [place]
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
## 2019-05-27 NOTE — TOC Progression Note (Addendum)
Transition of Care [facility]) - Progression Note    Patient Details  Name: Brendan [name] MRN: [id number] Date of Birth: [birth year]  Transition of [facility] CM/SW [place], [place] Phone Number: 05/27/2019, 1:06 PM  Clinical Narrative:   Update: Spoke with patients wife Brendan [name] and she confirmed with CSW that she does have a hearing for medicaid schedule for the middle of May. CSW will continue to follow.  CSW called financial counseling to follow up with them in regards to Medicaid status for patient. Kenda from financial counseling verified that patients wife has hearing with medicaid scheduled for middle of May. CSW will follow up with patients spouse for any other needs at this time.  CSW will continue to follow.  Expected Discharge Plan: Skilled Nursing Facility Barriers to Discharge: Vent Bed not available  Expected Discharge Plan and Services Expected Discharge Plan: [place]   Discharge Planning Services: CM Consult Post Acute Care Choice: Skilled Nursing Facility                                         Social Determinants of Health (SDOH) Interventions    Readmission Risk Interventions No flowsheet data found.

## 2019-05-27 NOTE — Progress Notes (Addendum)
TCTS DAILY ICU PROGRESS NOTE                   [address]            [employer] [address]          [phone]   25 Days Post-Op Procedure(s) (LRB): VIDEO BRONCHOSCOPY USING DISPOSABLE ANESTHESIA SCOPE (N/A) TRACHEOSTOMY (N/A)  Total Length of Stay:  LOS: 43 days   Subjective: Patient more alert this am. He is on vent and receiving CRRT this am  Objective: Vital signs in last 24 hours: Temp:  [96.4 F (35.8 C)-97.9 F (36.6 C)] 96.7 F (35.9 C) (05/11 0426) Pulse Rate:  [68-93] 87 (05/11 0700) Cardiac Rhythm: Normal sinus rhythm (05/10 2000) Resp:  [11-30] 30 (05/11 0700) SpO2:  [99 %-100 %] 100 % (05/11 0700) Arterial Line BP: (85-141)/(38-59) 112/47 (05/11 0700) FiO2 (%):  [40 %] 40 % (05/11 0426) Weight:  [55.8 kg] 55.8 kg (05/11 0500)  Filed Weights   05/25/19 0545 05/26/19 0500 05/27/19 0500  Weight: 60.1 kg 55.3 kg 55.8 kg    Weight change: 0.5 kg   CVP:  [0 mmHg-9 mmHg] 9 mmHg  Intake/Output from previous day: 05/10 0701 - 05/11 0700 In: 1130.5 [I.V.:210.4; NG/GT:720; IV Piggyback:200.1] Out: 2973 [Chest Tube:170]  Intake/Output this shift: No intake/output data recorded.  Current Meds: Scheduled Meds: . aspirin EC  325 mg Oral Daily   Or  . aspirin  324 mg Per Tube Daily  . B-complex with vitamin C  1 tablet Per Tube Daily  . bisacodyl  10 mg Rectal Once  . chlorhexidine gluconate (MEDLINE KIT)  15 mL Mouth Rinse BID  . Chlorhexidine Gluconate Cloth  6 each Topical Q0600  . citalopram  10 mg Per Tube Daily  . darbepoetin (ARANESP) injection - NON-DIALYSIS  100 mcg Subcutaneous Q Tue-1800  . docusate  200 mg Per Tube Daily  . feeding supplement (PRO-STAT SUGAR FREE 64)  30 mL Per Tube BID  . gabapentin  100 mg Oral Q8H  . insulin aspart  1 Units Subcutaneous Q4H  . insulin glargine  8 Units Subcutaneous BID  . mouth rinse  15 mL Mouth Rinse 10 times per day  . melatonin  3 mg Per Tube QHS  . midodrine  20 mg Per Tube Q8H  .  neomycin-polymyxin-hydrocortisone  3 drop Left EAR Q12H  . pantoprazole sodium  40 mg Per Tube BID  . polyethylene glycol  17 g Oral Daily  . QUEtiapine  25 mg Oral QHS  . rOPINIRole  0.5 mg Oral QHS  . sennosides  5 mL Per Tube BID  . sevelamer carbonate  0.8 g Per Tube TID WC  . sodium chloride flush  10-40 mL Intracatheter Q12H  . sorbitol  30 mL Oral Once   Continuous Infusions: .  prismasol BGK 4/2.5 500 mL/hr at 05/27/19 0115  .  prismasol BGK 4/2.5 300 mL/hr at 05/27/19 0254  . sodium chloride Stopped (05/26/19 2208)  . sodium chloride    . ceFEPime (MAXIPIME) IV Stopped (05/26/19 2158)  . feeding supplement (VITAL 1.5 CAL) 1,000 mL (05/26/19 2130)  . norepinephrine (LEVOPHED) Adult infusion 10 mcg/min (05/27/19 0700)  . prismasol BGK 4/2.5 1,500 mL/hr at 05/26/19 2318  . sodium phosphate  Dextrose 5% IVPB     PRN Meds:.Place/Maintain arterial line **AND** sodium chloride, sodium chloride, alum & mag hydroxide-simeth, dextrose, diphenhydrAMINE, fentaNYL (SUBLIMAZE) injection, Gerhardt's butt cream, heparin, heparin, levalbuterol, ondansetron (ZOFRAN) IV,  oxyCODONE, pneumococcal 23 valent vaccine, sodium chloride flush, sorbitol  Heart: RRR Lungs: Less rhonchus than previously Abdomen: Soft,  bowel sounds present Extremities: No LE edema Wound: Clean and dry.  Right chest tube: Yellow pleural fluid draining  Lab Results: CBC: Recent Labs    05/26/19 0445 05/27/19 0530  WBC 9.8 8.7  HGB 8.1* 9.0*  HCT 26.2* 29.5*  PLT 193 205   BMET:  Recent Labs    05/26/19 1638 05/27/19 0530  NA 137 137  K 5.0 4.5  CL 101 102  CO2 27 24  GLUCOSE 165* 130*  BUN 26* 28*  CREATININE 1.05 1.15  CALCIUM 8.8* 9.1    CMET: Lab Results  Component Value Date   WBC 8.7 05/27/2019   HGB 9.0 (L) 05/27/2019   HCT 29.5 (L) 05/27/2019   PLT 205 05/27/2019   GLUCOSE 130 (H) 05/27/2019   CHOL 174 08/21/2017   TRIG 143 08/21/2017   HDL 57 08/21/2017   LDLCALC 88 08/21/2017    ALT 15 05/27/2019   AST 29 05/27/2019   NA 137 05/27/2019   K 4.5 05/27/2019   CL 102 05/27/2019   CREATININE 1.15 05/27/2019   BUN 28 (H) 05/27/2019   CO2 24 05/27/2019   TSH 0.925 04/13/2019   INR 1.6 (H) 05/01/2019   HGBA1C 9.0 (H) 03/17/2019   MICROALBUR 30 08/21/2017    PT/INR:  No results for input(s): LABPROT, INR in the last 72 hours. Radiology: [facility] Chest Port 1 View  Result Date: 05/27/2019 CLINICAL DATA:  Mitral valve replacement EXAM: PORTABLE CHEST 1 VIEW COMPARISON:  Yesterday FINDINGS: Tracheostomy tube in place. Feeding tube that at least reaches the stomach. Right central line and left-sided dialysis catheter. Right-sided PICC. This hardware is in stable unremarkable position. CABG and mitral valve repair. Diffuse interstitial opacity with improvement suggesting edema. Small pleural effusions may be present. No visible pneumothorax IMPRESSION: 1. Stable hardware positioning. 2. Improved edema. Electronically Signed   By: Monte Fantasia M.D.   On: 05/27/2019 07:04    Assessment/Plan: S/P Procedure(s) (LRB): VIDEO BRONCHOSCOPY USING DISPOSABLE ANESTHESIA SCOPE (N/A) TRACHEOSTOMY (N/A)  1. CV-S/p removal of Impella on 04/09.  S/p V tach arrest 04/15. Previous a fib. SR with HR in the 80's this am.  On  Midodrine 20 mg tid and Nor epinephrine 10 mcg/min.Marland Kitchen Receiving intermittent CVVHD and remains pressor dependent.   Co ox this am slightly increased to 99.6 (likely not accurate) 2. Pulmonary-S/p trach 04/16. ABG results noted. Right chest tube with 170 last 24 hours. CXR this am appears to show patient rotated to the left, diffuse bilateral interstitial and alveolar airspace disease, no pneumothorax, small right pleural effusion. Hope to remove chest tube soon. 3. Expected post op blood loss anemia-H and H yesterday slightly increased to 9 and 29.5. Continue Aranesp 4. DM-CBGs 89/98/75. On Insulin. He is on Insulin and this has been decreased to avoid hypoglycemia. He was on  Metformin 1000 mg bid prior to surgery, but will continue on Insulin for now as NPO. Pre op HGA1C 9. He will need close medical follow up after discharge 5. Oliguric/anuric AKI-Creatinine 1.15 this am. Nephrology following and arranging for CRRT accordingly 6. GI-severe malnutrition of chronic illness.  Cortrak, TFs. Speech pathology evaluation done yesterday and recommendations to be followed accordingly. Albumin 3.2 7. Acute systolic heart failure-CVVHD helping with volume status 8. Previous elevated transaminases, AST 29, ALT 15 and alk phos down to 294. 9. Extremely deconditioned-will need PT as able. He did  not want to participate with PT yesterday but did do approved bed exercises  [name] PA-C 05/27/2019 7:43 AM   Patient showing improvement in pulmonary status, overall strength and engagement.  BP is improved with decreasing norepi requirement tHis CXR is improved, tolerating PS trials , and able to stand with assist  Short term goals to transition back to iHD now that BP better, and to progress back to trach collar. Will ask for CIR consult when he can walk a few steps, is back on trach collar and iHD  patient examined and medical record reviewed,agree with above note. [name] 05/27/2019

## 2019-05-27 NOTE — Progress Notes (Signed)
TCTS BRIEF SICU PROGRESS NOTE  25 Days Post-Op  S/P Procedure(s) (LRB): VIDEO BRONCHOSCOPY USING DISPOSABLE ANESTHESIA SCOPE (N/A) TRACHEOSTOMY (N/A)   Stable day  Plan: Continue current plan  Rexene Alberts, MD 05/27/2019 7:13 PM

## 2019-05-27 NOTE — Progress Notes (Signed)
NAME:  Brendan [name], MRN:  [id number], DOB:  March 24, 1955, LOS: 53 ADMISSION DATE:  04/04/2019, CONSULTATION DATE:  04/17/19 REFERRING MD:  Dr. Haroldine Laws, CHIEF COMPLAINT:  Respiratory distress   Brief History   84 yoM originally presented with SOB and fatigue at [facility] found to have new HFrEF, NSTEMI, and bilateral pleural effusions transferred to [facility] on 3/29 for further cardiac evaluation. 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 Started on lasix and milrinone gtts however developed NSVT. Was taken 4/1 for placement of IABP and swan for optimization prior to CABG. Was on precedex for agitation/ confusion, some concern for DTs. On the evening of 4/1, patient developed worsening respiratory distress and hypoxia, PCCM consulted for intubation and vent management.  Patient was extubated on 4/14 and developed worsening hypoxia on BiPAP. Overnight he developed pulseless VT requiring 1 minute of CPR and epinephrine to achieve ROSC. Neurovascularly intact afterward. Subsequently had increased work of breathing and worsening respiratory distress requiring intubation. Patient had tracheostomy performed on 05/12/2019. Trying to wean off ventilator.   Past Medical History  Tobacco abuse, HTN, poorly controlled diabetes, diabetic neuropathy  Significant Hospital Events   3/30 Lt heart cath/TEE performed 4/1 Intubated, RHC/IABP/PA cath 4/2 Impella placement 4/3 febrile over evening and night. Blood cultures obtained. Vanc/cefepime started. Hematuria--heparin held. 4/4 Extubated,abx transitioned to rocephin. Heparin restarted 4/6 Re-intubated for CABG/MVR 4/7 Extubated from CABG to BiPAP 4/9 Impella removed  4/9 TEE 4/10 CVC inserted &CRRT initiated  4/12 PCCMre-consulted re-intubation 4/14 Extubated to BiPAP 4/15 Re-intubated due to respiratory distress 4/16 Tracheostomy 4/27 bronch with BAL 5/7 called back for hypercarbic failure. #6 cuffed placed. Right chest tube placed for enlarging  effusion.  Consults:  TCTS PCCM HF Nephrology  Procedures:  R cephalic double lumen PICC LIJ tunneled HD catheter L brachial arterial line 6-0 cuffed shiley  Significant Diagnostic Tests:  3/30 R/ LHC >>  Ost LM to Mid LM lesion is 35% stenosed.  Prox LAD lesion is 90% stenosed.  Prox LAD to Mid LAD lesion is 50% stenosed.  Mid Cx lesion is 100% stenosed.  Prox RCA to Mid RCA lesion is 90% stenosed.  RPDA lesion is 95% stenosed.  LV end diastolic pressure is severely elevated.  Hemodynamic findings consistent with moderate pulmonary hypertension. 1. Severe 3 vessel obstructive CAD 2. High LV filling pressures 3. Reduced cardiac output with index 2.3 4. Moderate pulmonary HTN.  3/30TTE >> 1. Left ventricular ejection fraction, by estimation, is 30 to 35%. The left ventricle has moderately decreased function. The left ventricle demonstrates regional wall motion abnormalities.There is mild left ventricular hypertrophy. Left ventricular diastolic function could not be evaluated. There is severe hypokinesis of the left ventricular, entire inferior wall, inferoseptal wall, apical segment and lateral wall.  2. Right ventricular systolic function is hyperdynamic. The right ventricular size is normal. There is moderately elevated pulmonary artery systolic pressure.  3. Decreased posterior leaflet motion due to ischemic tethering of the mitral valve leaflets.  4. The mitral valve is abnormal. Moderate to severe mitral valve regurgitation.  5. The aortic valve is tricuspid. Aortic valve regurgitation is not visualized. Mild aortic valve sclerosis is present, with no evidence of aortic valve stenosis.  6. The inferior vena cava is normal in size with greater than 50% respiratory variability, suggesting right atrial pressure of 3 mmHg.   4/1 CT chest w/o >>Large bilateral pleural effusions with associated atelectasis.Moderate to severe bilateral ground-glass opacities,  likely reflecting pneumonia and/or edema.  4/1 RHC >> RA =  18 RV = 60/20  PA = 63/29 (42) PCW = 33 Fick cardiac output/index = 3.7/1.9 PVR = 2.5 WU FA sat = 98% PA sat = 47%, 49% PaPi = 2.2   Micro Data:  5/7 R pleural fluid neg 5/7 blood neg to date 5/6 tracheal aspirate pending  Antimicrobials:    Interim history/subjective:  Awake alert, following commands, denies pain. On levophed 9mg/min  Objective   Blood pressure (!) 128/56, pulse 87, temperature (!) 96.7 F (35.9 C), temperature source Axillary, resp. rate (!) 30, height _0  (1.803 m), weight 55.8 kg, SpO2 100 %. CVP:  [0 mmHg-9 mmHg] 9 mmHg  Vent Mode: PRVC FiO2 (%):  [40 %] 40 % Set Rate:  [30 bmp] 30 bmp Vt Set:  [560 mL] 560 mL PEEP:  [5 cmH20] 5 cmH20 Pressure Support:  [10 cmH20] 10 cmH20 Plateau Pressure:  [20 cmH20-27 cmH20] 23 cmH20   Intake/Output Summary (Last 24 hours) at 05/27/2019 0730 Last data filed at 05/27/2019 0700 Gross per 24 hour  Intake 1130.48 ml  Output 2973 ml  Net -1842.52 ml   Filed Weights   05/25/19 0545 05/26/19 0500 05/27/19 0500  Weight: 60.1 kg 55.3 kg 55.8 kg    Examination:   GEN: Frail elderly man on vent HEENT: Tracheostomy in place, small mucoid secretions CV: Heart sounds regular, extremities are warm PULM: clear today, chest tube in place with serous fluid, no air leak GI: Soft, hyperactive bowel sounds, NG tube and rectal in place EXT: Profound muscle wasting with superimposed anasarca NEURO: He will move all 4 extremities briskly to command PSYCH: RASS 0 this AM SKIN: He has multiple areas of bruising and skin breakdown  Phos a little low Cr slightly higher Phos low ABG yesterday looks good CXR showing BL infiltrates but improved   Assessment & Plan:   Recurrent hypoxemic and hypercarbic respiratory failure after CABG now s/p trach.  Multifactorial.  See discussion in 5/8 note.  Enterobacter in sputum being treated.  - PS trials during  day, vent at night - Chest tube management per primary, can place pigtail in left pleural space PRN - cefepime x 7 days  Shock-again multifactorial.  See discussion in 5/8 note. I think at this point it is just cardiogenic and renal vasoplegic. - Continue pressors and high dose midodrine, not sure what else to do here, goal MAP 60  Agitated delirium- improved - seroquel qHS, continue melatonin - continue PRN oxycodone - re-orient, encourage day/night cycles  Constipation - Discussed with PharmD, she will adjust medications  Sugars- improved  Profound muscular deconditioning- biggest barrier to recovery at this point  Goals of care- awaiting palliative to establish relationship with patient given prolonged hospital stay with multiple complications  Best practice:  Diet: Tube feeds Pain/Anxiety/Delirium protocol (if indicated): see above VAP protocol (if indicated):HOB 30 degrees  DVT prophylaxis: SCDs for now resumption of pharmacologic per primary (h/h keeps dropping) GI prophylaxis: PPI Glucose control:SSI Mobility: BR Code Status: Full  Family Communication:per primary team Disposition:ICU pending CRRT and vent liberation  The patient is critically ill with multiple organ systems failure and requires high complexity decision making for assessment and support, frequent evaluation and titration of therapies, application of advanced monitoring technologies and extensive interpretation of multiple databases. Critical Care Time devoted to patient care services described in this note independent of APP/resident time (if applicable)  is 34 minutes.   DErskine EmeryMD LHawthornePulmonary Critical Care 05/27/2019 7:30 AM Personal pager: #[phone] unanswered,  please page CCM On-call: [phone]

## 2019-05-27 NOTE — NC FL2 (Signed)
[place] LEVEL OF CARE SCREENING TOOL     IDENTIFICATION  Patient Name: Brendan [name] Birthdate: 01/23/1955 Sex: male Admission Date (Current Location): 03/27/2019  [facility] and [place] Number:  Herbalist and Address:  [facility], [place] [address] [place], [place] [address]      Provider Number: [id number]  Attending Physician Name and Address:  Ivin Poot, MD  Relative Name and Phone Number:       Current Level of Care: Hospital Recommended Level of Care: [place] Prior Approval Number:    Date Approved/Denied: 05/21/19 PASRR Number: [id number]  Discharge Plan:      Current Diagnoses: Patient Active Problem List   Diagnosis Date Noted  . Cardiogenic shock ([place]) 05/15/2019  . Acute respiratory failure with hypoxia ([place]) 05/15/2019  . Pressure injury of skin 05/12/2019  . Endotracheal tube present   . S/P CABG x 3   . Aspiration pneumonia ([place])   . Chronic respiratory failure with hypoxia ([place])   . S/P MVR (mitral valve repair) 04/28/2019  . Pulmonary edema with congestive heart failure ([place])   . Severe mitral regurgitation   . 3-vessel CAD   . Protein-calorie malnutrition, severe 04/16/2019  . Acute systolic congestive heart failure ([place])   . NSTEMI (non-ST elevated myocardial infarction) ([place]) 03/31/2019  . Uncontrolled type 2 diabetes mellitus with hyperglycemia ([place]) 08/22/2017  . Frequent falls 05/29/2017  . Adjustment disorder with depressed mood 05/29/2017  . Peripheral neuropathy 05/29/2017  . Lower extremity pain 05/19/2013  . Encounter for intubation 03/11/2012  . Elevated blood pressure 03/11/2012  . Type II diabetes mellitus with complication, uncontrolled ([place]) 03/15/2006    Orientation RESPIRATION BLADDER Height & Weight     (Intubated, trach)  Tracheostomy, Vent(40%) Continent Weight: 123 lb 0.3 oz (55.8 kg) Height:  '5\' 11"'$  (180.3 cm)(measured x 3)  BEHAVIORAL  SYMPTOMS/MOOD NEUROLOGICAL BOWEL NUTRITION STATUS      Incontinent Diet(See Discharge Summary)  AMBULATORY STATUS COMMUNICATION OF NEEDS Skin   Total Care (intubated.trached) Skin abrasions, Other (Comment)(Abras cathentryexit site kneeleg sacrumchest abdombilat.skin tear abdom.buttocks bilat.press.injuryperin.medialstage 2 press.injury coccyxmedialmild deep tiss. press. injury incis. closed axilla rt inci.closdchest incisclosed leg left incis.closed leg rt)                       Personal Care Assistance Level of Assistance  Bathing, Feeding, Dressing Bathing Assistance: Maximum assistance Feeding assistance: Maximum assistance(Tube Feeding) Dressing Assistance: Maximum assistance     Functional Limitations Info  Sight, Hearing, Speech Sight Info: Adequate Hearing Info: Adequate Speech Info: Impaired(intubated/trach)    SPECIAL CARE FACTORS FREQUENCY  PT (By licensed PT), OT (By licensed OT)     PT Frequency: 5x min weekly OT Frequency: 5x min weekly            Contractures Contractures Info: Not present    Additional Factors Info  Code Status, Allergies Code Status Info: FULL Allergies Info: Acetaminophen           Current Medications (05/27/2019):  This is the current hospital active medication list Current Facility-Administered Medications  Medication Dose Route Frequency Provider Last Rate Last Admin  .  prismasol BGK 4/2.5 infusion   CRRT Continuous Donato Heinz, MD 500 mL/hr at 05/27/19 0115 New Bag at 05/27/19 0115  .  prismasol BGK 4/2.5 infusion   CRRT Continuous Donato Heinz, MD 300 mL/hr at 05/27/19 0254 New Bag at 05/27/19 0254  . 0.9 %  sodium  chloride infusion   Intra-arterial PRN Ivin Poot, MD   Stopped at 05/27/19 [phone]  . 0.9 %  sodium chloride infusion   Intravenous PRN Prescott Gum, Collier [place]ina, MD      . alum & mag hydroxide-simeth (MAALOX/MYLANTA) 200-200-20 MG/5ML suspension 15 mL  15 mL Oral BID PRN Ivin Poot, MD      .  aspirin EC tablet 325 mg  325 mg Oral Daily Prescott Gum, Collier Salina, MD   325 mg at 04/24/19 1013   Or  . aspirin chewable tablet 324 mg  324 mg Per Tube Daily Ivin Poot, MD   324 mg at 05/26/19 0941  . B-complex with vitamin C tablet 1 tablet  1 tablet Per Tube Daily Ivin Poot, MD   1 tablet at 05/26/19 0941  . ceFEPIme (MAXIPIME) 2 g in sodium chloride 0.9 % 100 mL IVPB  2 g Intravenous Q12H [name], MD   Stopped at 05/26/19 2158  . chlorhexidine gluconate (MEDLINE KIT) (PERIDEX) 0.12 % solution 15 mL  15 mL Mouth Rinse BID Prescott Gum, Collier Salina, MD   15 mL at 05/27/19 0807  . Chlorhexidine Gluconate Cloth 2 % PADS 6 each  6 each Topical Q0600 Claudia Desanctis, MD   6 each at 05/27/19 [phone]  . citalopram (CELEXA) 10 MG/5ML suspension 10 mg  10 mg Per Tube Daily Ivin Poot, MD   10 mg at 05/26/19 0942  . Darbepoetin Alfa (ARANESP) injection 100 mcg  100 mcg Subcutaneous Q Tue-1800 Corliss Parish, MD      . dextrose 50 % solution 12.5-50 g  12.5-50 g Intravenous PRN Prescott Gum, Collier Salina, MD      . diphenhydrAMINE (BENADRYL) 12.5 MG/5ML elixir 12.5 mg  12.5 mg Per Tube BID PRN Prescott Gum, Collier Salina, MD      . docusate (COLACE) 50 MG/5ML liquid 200 mg  200 mg Per Tube Daily Prescott Gum, Collier Salina, MD   200 mg at 05/26/19 0941  . feeding supplement (PRO-STAT SUGAR FREE 64) liquid 30 mL  30 mL Per Tube BID Ivin Poot, MD   30 mL at 05/26/19 2111  . feeding supplement (VITAL 1.5 CAL) liquid 1,000 mL  1,000 mL Per Tube Continuous Ivin Poot, MD 60 mL/hr at 05/26/19 2130 1,000 mL at 05/26/19 2130  . fentaNYL (SUBLIMAZE) injection 12.5 mcg  12.5 mcg Intravenous Q2H PRN Ivin Poot, MD   12.5 mcg at 05/24/19 0606  . gabapentin (NEURONTIN) 250 MG/5ML solution 100 mg  100 mg Oral Q8H Bensimhon, Shaune Pascal, MD   100 mg at 05/27/19 0626  . Gerhardt's butt cream 1 application  1 application Topical PRN Gaye Pollack, MD   1 application at 47/09/62 0300  . heparin injection 1,000-6,000 Units   1,000-6,000 Units CRRT PRN Donato Heinz, MD   4,000 Units at 05/26/19 0022  . heparinized saline (2000 units/L) primer fluid for CRRT   CRRT PRN Donato Heinz, MD   2,000 mL at 05/26/19 0043  . insulin aspart (novoLOG) injection 1 Units  1 Units Subcutaneous Q4H Nani Skillern, PA-C   1 Units at 05/27/19 0442  . insulin glargine (LANTUS) injection 8 Units  8 Units Subcutaneous BID [name], MD   8 Units at 05/26/19 2111  . levalbuterol (XOPENEX) nebulizer solution 0.63 mg  0.63 mg Nebulization Q6H PRN Ivin Poot, MD   0.63 mg at 04/28/19 1938  . MEDLINE mouth rinse  15 mL Mouth Rinse  10 times per day Prescott Gum, Collier Salina, MD   15 mL at 05/27/19 2706  . melatonin tablet 3 mg  3 mg Per Tube QHS Prescott Gum, Collier Salina, MD   3 mg at 05/26/19 2111  . midodrine (PROAMATINE) tablet 20 mg  20 mg Per Tube Q8H [name], MD   20 mg at 05/27/19 2376  . neomycin-polymyxin-hydrocortisone (CORTISPORIN) OTIC (EAR) suspension 3 drop  3 drop Left EAR Q12H Ivin Poot, MD   3 drop at 05/26/19 2125  . norepinephrine (LEVOPHED) 16 mg in 274m premix infusion  0-15 mcg/min Intravenous Titrated VPrescott Gum PCollier Salina MD 7.5 mL/hr at 05/27/19 0800 8 mcg/min at 05/27/19 0800  . ondansetron (ZOFRAN) injection 4 mg  4 mg Intravenous Q6H PRN VIvin Poot MD   4 mg at 05/20/19 2046  . oxyCODONE (ROXICODONE INTENSOL) 20 MG/ML concentrated solution 5 mg  5 mg Per Tube Q3H PRN SCandee Furbish MD      . pantoprazole sodium (PROTONIX) 40 mg/20 mL oral suspension 40 mg  40 mg Per Tube BID VIvin Poot MD   40 mg at 05/26/19 2113  . pneumococcal 23 valent vaccine (PNEUMOVAX-23) injection 0.5 mL  0.5 mL Intramuscular Prior to discharge VPrescott Gum PCollier Salina MD      . polyethylene glycol ([facility]/ GFloria Raveling packet 17 g  17 g Oral Daily SCandee Furbish MD      . prismasol BGK 4/2.5 infusion   CRRT Continuous CDonato Heinz MD 1,500 mL/hr at 05/26/19 2318 New Bag at 05/26/19 2318  . QUEtiapine  (SEROQUEL) tablet 25 mg  25 mg Oral QHS SCandee Furbish MD   25 mg at 05/26/19 2111  . rOPINIRole (REQUIP) tablet 0.5 mg  0.5 mg Oral QHS VPrescott Gum PCollier Salina MD   0.5 mg at 05/26/19 2112  . sennosides (SENOKOT) 8.8 MG/5ML syrup 5 mL  5 mL Per Tube BID SCandee Furbish MD   5 mL at 05/26/19 2111  . sevelamer carbonate (RENVELA) packet 0.8 g  0.8 g Per Tube TID WC VPrescott Gum PCollier Salina MD   0.8 g at 05/26/19 1632  . sodium chloride flush (NS) 0.9 % injection 10-40 mL  10-40 mL Intracatheter Q12H VIvin Poot MD   10 mL at 05/26/19 2128  . sodium chloride flush (NS) 0.9 % injection 10-40 mL  10-40 mL Intracatheter PRN VPrescott Gum PCollier Salina MD      . sodium phosphate 30 mmol in dextrose 5 % 250 mL infusion  30 mmol Intravenous Once GCorliss Parish MD 43 mL/hr at 05/27/19 0800 Rate Verify at 05/27/19 0800  . sorbitol 70 % solution 30 mL  30 mL Per Tube Daily PRN SCandee Furbish MD         Discharge Medications: Please see discharge summary for a list of discharge medications.  Relevant Imaging Results:  Relevant Lab Results:   Additional Information [id number]  ATrula Ore LCSWA

## 2019-05-27 NOTE — Progress Notes (Signed)
SLP Cancellation Note  Patient Details Name: Brendan [name] MRN: [id number] DOB: Feb 04, 1955   Cancelled treatment:       Reason Eval/Treat Not Completed: Patient not medically ready  Magaby Rumberger B. Quentin Ore, [facility], [place] Speech Language Pathologist Office: [phone] Pager: [phone]  Shonna Chock 05/27/2019, 11:07 AM

## 2019-05-27 NOTE — TOC Initial Note (Signed)
Transition of Care [facility]) - Initial/Assessment Note    Patient Details  Name: Brendan [name] MRN: [id number] Date of Birth: 11/19/1955  Transition of Care [facility]) CM/SW Contact:    Trula Ore, [place] Phone Number: 05/27/2019, 8:20 AM  Clinical Narrative:   Update: [facility] cannot accept this patient. They do not accept HD patients. CSW will follow up with the other facility's to see if patient meets their criteria.   CSW faxed out initial referral for Vent SNFs. Will follow up with facility's to see who can accept patient for SNF placement.  Pending bed offers, Pending Medicaid  CSW will continue to follow.               Expected Discharge Plan: Skilled Nursing Facility Barriers to Discharge: Vent Bed not available   Patient Goals and CMS Choice Patient states their goals for this hospitalization and ongoing recovery are:: Patient in need of qualifying payor source for LTAC/SNF placement for vent/trach capability. CMS Medicare.gov Compare Post Acute Care list provided to:: Patient Represenative (must comment) Choice offered to / list presented to : Spouse  Expected Discharge Plan and Services Expected Discharge Plan: [place]   Discharge Planning Services: CM Consult Post Acute Care Choice: [place]                                        Prior Living Arrangements/Services   Lives with:: Spouse Patient language and need for interpreter reviewed:: Yes Do you feel safe going back to the place where you live?: No   Vent SNF  Need for Family Participation in Patient Care: Yes (Comment) Care giver support system in place?: Yes (comment)   Criminal Activity/Legal Involvement Pertinent to Current Situation/Hospitalization: No - Comment as needed  Activities of Daily Living Home Assistive Devices/Equipment: None ADL Screening (condition at time of admission) Patient's cognitive ability adequate to safely complete daily  activities?: Yes Is the patient deaf or have difficulty hearing?: No Does the patient have difficulty seeing, even when wearing glasses/contacts?: No Does the patient have difficulty concentrating, remembering, or making decisions?: No Patient able to express need for assistance with ADLs?: Yes Does the patient have difficulty dressing or bathing?: Yes Independently performs ADLs?: Yes (appropriate for developmental age) Does the patient have difficulty walking or climbing stairs?: Yes Weakness of Legs: None Weakness of Arms/Hands: None  Permission Sought/Granted Permission sought to share information with : Case Manager, Family Supports, Investment banker, corporate granted to share info w AGENCY: Left message with Water quality scientist at [facility] for possible placement with LOG from [employer]  Permission granted to share info w Relationship: spouse     Emotional Assessment Appearance:: Appears older than stated age Attitude/Demeanor/Rapport: Gracious Affect (typically observed): Accepting Orientation: : (Intubated, Trach) Alcohol / Substance Use: Not Applicable Psych Involvement: No (comment)  Admission diagnosis:  NSTEMI (non-ST elevated myocardial infarction) ([place]) [I21.4] S/P MVR (mitral valve repair) [Z98.890] Patient Active Problem List   Diagnosis Date Noted  . Cardiogenic shock ([place]) 05/15/2019  . Acute respiratory failure with hypoxia ([place]) 05/15/2019  . Pressure injury of skin 05/12/2019  . Endotracheal tube present   . S/P CABG x 3   . Aspiration pneumonia ([place])   . Chronic respiratory failure with hypoxia ([place])   . S/P MVR (mitral valve repair) 05/01/2019  . Pulmonary edema with congestive  heart failure ([place])   . Severe mitral regurgitation   . 3-vessel CAD   . Protein-calorie malnutrition, severe 04/16/2019  . Acute systolic congestive heart failure ([place])   . NSTEMI (non-ST elevated myocardial infarction) ([place]) 03/30/2019  . Uncontrolled type  2 diabetes mellitus with hyperglycemia ([place]) 08/22/2017  . Frequent falls 05/29/2017  . Adjustment disorder with depressed mood 05/29/2017  . Peripheral neuropathy 05/29/2017  . Lower extremity pain 05/19/2013  . Encounter for intubation 03/11/2012  . Elevated blood pressure 03/11/2012  . Type II diabetes mellitus with complication, uncontrolled ([place]) 03/15/2006   PCP:  [name], MD Pharmacy:   [place] [facility] [address] Phone: [phone] Fax: [phone]     Social Determinants of Health (SDOH) Interventions    Readmission Risk Interventions No flowsheet data found.

## 2019-05-27 NOTE — Progress Notes (Addendum)
Patient ID: Brendan [name], male   DOB: [birth year], 64 y.o.   MRN: [id number]     Advanced Heart Failure Rounding Note  PCP-Cardiologist: No primary care provider on file.   Subjective:    Remains on vent FiO2 40% and CRRT   On 5/9 received 1UPRBCs. 7.1>8.1 >9   Currently on Norepi  8 mcg. CO-OX 58%   Awake on the vent. Denies pain.   Objective:   Weight Range: 55.8 kg Body mass index is 17.16 kg/m.   Vital Signs:   Temp:  [96.4 F (35.8 C)-97.9 F (36.6 C)] 96.7 F (35.9 C) (05/11 0426) Pulse Rate:  [68-93] 87 (05/11 0700) Resp:  [11-30] 30 (05/11 0700) SpO2:  [99 %-100 %] 100 % (05/11 0801) Arterial Line BP: (85-141)/(38-59) 112/47 (05/11 0700) FiO2 (%):  [40 %] 40 % (05/11 0801) Weight:  [55.8 kg] 55.8 kg (05/11 0500) Last BM Date: 05/24/19  Weight change: Filed Weights   05/25/19 0545 05/26/19 0500 05/27/19 0500  Weight: 60.1 kg 55.3 kg 55.8 kg    Intake/Output:   Intake/Output Summary (Last 24 hours) at 05/27/2019 0825 Last data filed at 05/27/2019 0800 Gross per 24 hour  Intake 1075.12 ml  Output 2911 ml  Net -1835.88 ml      Physical Exam  CVP 4-5  General:  Thin cachetic. On vent through trach  HEENT: normal Neck: Trach, supple. JVP 9-10   Carotids 2+ bilat; no bruits. No lymphadenopathy or thryomegaly appreciated. Cor: PMI nondisplaced. Regular rate & rhythm. No rubs, gallops or murmurs. Lungs: clear Abdomen: soft, nontender, nondistended. No hepatosplenomegaly. No bruits or masses. Good bowel sounds. Extremities: no cyanosis, clubbing, rash, edema. RUE PICC  Neuro: Awake on the vent MAE x4.   Telemetry  NSR 70-80s    Labs    CBC Recent Labs    05/26/19 0445 05/26/19 0445 05/27/19 0530 05/27/19 0814  WBC 9.8  --  8.7  --   HGB 8.1*   < > 9.0* 10.2*  HCT 26.2*   < > 29.5* 30.0*  MCV 96.7  --  97.4  --   PLT 193  --  205  --    < > = values in this interval not displayed.   Basic Metabolic Panel Recent Labs    05/26/19 0445  05/26/19 0445 05/26/19 1638 05/26/19 1638 05/27/19 0530 05/27/19 0814  NA 138   < > 137   < > 137 137  K 4.6   < > 5.0   < > 4.5 4.8  CL 102   < > 101  --  102  --   CO2 25   < > 27  --  24  --   GLUCOSE 78   < > 165*  --  130*  --   BUN 28*   < > 26*  --  28*  --   CREATININE 1.03   < > 1.05  --  1.15  --   CALCIUM 9.1   < > 8.8*  --  9.1  --   MG 2.8*  --   --   --  2.9*  --   PHOS 2.1*   < > 2.0*  --  1.3*  --    < > = values in this interval not displayed.   Liver Function Tests Recent Labs    05/26/19 1638 05/27/19 0530  AST  --  29  ALT  --  15  ALKPHOS  --  294*  BILITOT  --  0.9  PROT  --  7.1  ALBUMIN 3.4* 3.2*  3.2*   No results for input(s): LIPASE, AMYLASE in the last 72 hours. Cardiac Enzymes No results for input(s): CKTOTAL, CKMB, CKMBINDEX, TROPONINI in the last 72 hours.  BNP: BNP (last 3 results) Recent Labs    03/27/2019 0113  BNP 655.7*    ProBNP (last 3 results) No results for input(s): PROBNP in the last 8760 hours.   D-Dimer No results for input(s): DDIMER in the last 72 hours. Hemoglobin A1C No results for input(s): HGBA1C in the last 72 hours. Fasting Lipid Panel No results for input(s): CHOL, HDL, LDLCALC, TRIG, CHOLHDL, LDLDIRECT in the last 72 hours. Thyroid Function Tests No results for input(s): TSH, T4TOTAL, T3FREE, THYROIDAB in the last 72 hours.  Invalid input(s): FREET3  Other results:   Imaging    DG Chest Port 1 View  Result Date: 05/27/2019 CLINICAL DATA:  Mitral valve replacement EXAM: PORTABLE CHEST 1 VIEW COMPARISON:  Yesterday FINDINGS: Tracheostomy tube in place. Feeding tube that at least reaches the stomach. Right central line and left-sided dialysis catheter. Right-sided PICC. This hardware is in stable unremarkable position. CABG and mitral valve repair. Diffuse interstitial opacity with improvement suggesting edema. Small pleural effusions may be present. No visible pneumothorax IMPRESSION: 1. Stable  hardware positioning. 2. Improved edema. Electronically Signed   By: Monte Fantasia M.D.   On: 05/27/2019 07:04     Medications:     Scheduled Medications: . aspirin EC  325 mg Oral Daily   Or  . aspirin  324 mg Per Tube Daily  . B-complex with vitamin C  1 tablet Per Tube Daily  . chlorhexidine gluconate (MEDLINE KIT)  15 mL Mouth Rinse BID  . Chlorhexidine Gluconate Cloth  6 each Topical Q0600  . citalopram  10 mg Per Tube Daily  . darbepoetin (ARANESP) injection - NON-DIALYSIS  100 mcg Subcutaneous Q Tue-1800  . docusate  200 mg Per Tube Daily  . feeding supplement (PRO-STAT SUGAR FREE 64)  30 mL Per Tube BID  . gabapentin  100 mg Oral Q8H  . insulin aspart  1 Units Subcutaneous Q4H  . insulin glargine  8 Units Subcutaneous BID  . mouth rinse  15 mL Mouth Rinse 10 times per day  . melatonin  3 mg Per Tube QHS  . midodrine  20 mg Per Tube Q8H  . neomycin-polymyxin-hydrocortisone  3 drop Left EAR Q12H  . pantoprazole sodium  40 mg Per Tube BID  . polyethylene glycol  17 g Oral Daily  . QUEtiapine  25 mg Oral QHS  . rOPINIRole  0.5 mg Oral QHS  . sennosides  5 mL Per Tube BID  . sevelamer carbonate  0.8 g Per Tube TID WC  . sodium chloride flush  10-40 mL Intracatheter Q12H    Infusions: .  prismasol BGK 4/2.5 500 mL/hr at 05/27/19 0115  .  prismasol BGK 4/2.5 300 mL/hr at 05/27/19 0254  . sodium chloride Stopped (05/27/19 0753)  . sodium chloride    . ceFEPime (MAXIPIME) IV Stopped (05/26/19 2158)  . feeding supplement (VITAL 1.5 CAL) 1,000 mL (05/26/19 2130)  . norepinephrine (LEVOPHED) Adult infusion 8 mcg/min (05/27/19 0800)  . prismasol BGK 4/2.5 1,500 mL/hr at 05/26/19 2318  . sodium phosphate  Dextrose 5% IVPB 43 mL/hr at 05/27/19 0800    PRN Medications: Place/Maintain arterial line **AND** sodium chloride, sodium chloride, alum & mag hydroxide-simeth, dextrose, diphenhydrAMINE, fentaNYL (SUBLIMAZE) injection, Gerhardt's butt cream, heparin, heparin,  levalbuterol, ondansetron (ZOFRAN) IV, oxyCODONE, pneumococcal 23 valent vaccine, sodium chloride flush, sorbitol     Assessment/Plan   1. Acute systolic HF ->  Cardiogenic Shock  - Due to iCM. EF 20-25% - Impella 5.5 placed on 4/2.   - s/p CABG/MVRepair on 4/6  - Impella out 4/9.  - Echo 4/10 with EF 25-30%, normal RV, no MR.  - Echo 4/22 EF 30-35% mildly reduced RV MVR ok   - CO-OX 58% on Norepi 8 mcg + midodrine 20 mg tid.   2. 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 Echo stable -No s/s ischemia - On ASA/Crestor  3. Acute hypoxic respiratory failure - Due to pulmonary edema and PNA - Extubated 4/9 re-intubated on 4/12. Failed re-extubation on 4/14. Now s/p tracheostom - S/P Bronch 4/27 - 60K yeast. -> ? Colonization.  - CT scan 5/7 with diffuse bilateral infiltrates c/w ARDS and large bilateral effusions R>L.  - R chest tube placed with > 1L out. Can tap left effusion as needed - Remains on vent FiO2 40% - 4. Mitral Regurgitation - Mod-severe on ECHO - s/p MV repair, TEE 4/9 with minimal MR s/p repair.   - Echo 4/10 with no significant MR.  - echo 4/22 MVR stable  5. Uncontrolled DM - Hgb A1C 9.  - On insulin and sliding scale.  - No change  6. AKI - due to shock - Remains anuric post-op - Continue CVVHD per Nephrology. Management as above  7. Acute blood loss Anemia  - Transfused 1UPRBCs 05/25/19 Hgb 7.1>8.1>10.2  - No obvious bleeding.   8. Polymorphic VT - Recurrent VT during respiratory distress 4/14, - Off amio  - No further VT.  - Keep K> 4.0 Mg > 2.0  - no change  9. Severe Malnutrition - Prealbumin improved, up from 8.9>>18.7 - Nutrition on board - Continue w/ TFs  10. Atrial fibrillation. paroxysmal - Remains in NSR.  - Amiodarone was stopped.   2. Debility, severe - as above will likely need LTAC on d/c  12. Chronic pain issues - pain meds being tapered  Amy Clegg NP-C  8:25 AM   .Agree with  above  He is more awake and alert today. Remains on vent through trach. Back on CVVHD pulling 50.   NE decreased from 10 to 8. Co-ox 58% Very weak.   General:  Cachetic weak appearing.  HEENT: normal Neck: supple. no JVD. + trach Carotids 2+ bilat; no bruits. No lymphadenopathy or thryomegaly appreciated. Cor: PMI nondisplaced. Regular rate & rhythm. No rubs, gallops or murmurs. Lungs: clear Abdomen: soft, nontender, nondistended. No hepatosplenomegaly. No bruits or masses. Good bowel sounds. Extremities: no cyanosis, clubbing, rash + trace edema +wounds Neuro: alert & orientedx3, cranial nerves grossly intact. moves all 4 extremities w/o difficulty. Affect pleasant  He has MSOF post high-risk CABG. No much progress. Continue to wean NE slowly as tolerated. If we can get him off pressors will need LTAC placement however he failed NE wean last weak with severely depressed co-ox. Will try again slowly.   Palliative Care team consulted for [place].   CRITICAL CARE Performed by: Glori Bickers  Total critical care time: 35 minutes  Critical care time was exclusive of separately billable procedures and treating other patients.  Critical care was necessary to treat or prevent imminent or life-threatening deterioration.  Critical care was time spent personally by me (independent of midlevel providers or residents) on the  following activities: development of treatment plan with patient and/or surrogate as well as nursing, discussions with consultants, evaluation of patient's response to treatment, examination of patient, obtaining history from patient or surrogate, ordering and performing treatments and interventions, ordering and review of laboratory studies, ordering and review of radiographic studies, pulse oximetry and re-evaluation of patient's condition.  Glori Bickers, MD  9:11 AM

## 2019-05-28 ENCOUNTER — Inpatient Hospital Stay (HOSPITAL_COMMUNITY): Payer: Medicaid Other

## 2019-05-28 DIAGNOSIS — Z9689 Presence of other specified functional implants: Secondary | ICD-10-CM

## 2019-05-28 DIAGNOSIS — Z515 Encounter for palliative care: Secondary | ICD-10-CM

## 2019-05-28 DIAGNOSIS — J189 Pneumonia, unspecified organism: Secondary | ICD-10-CM

## 2019-05-28 DIAGNOSIS — Z7189 Other specified counseling: Secondary | ICD-10-CM

## 2019-05-28 DIAGNOSIS — Z9289 Personal history of other medical treatment: Secondary | ICD-10-CM

## 2019-05-28 LAB — CBC
HCT: 28.7 % — ABNORMAL LOW (ref 39.0–52.0)
Hemoglobin: 8.9 g/dL — ABNORMAL LOW (ref 13.0–17.0)
MCH: 30.4 pg (ref 26.0–34.0)
MCHC: 31 g/dL (ref 30.0–36.0)
MCV: 98 fL (ref 80.0–100.0)
Platelets: 170 10*3/uL (ref 150–400)
RBC: 2.93 MIL/uL — ABNORMAL LOW (ref 4.22–5.81)
RDW: 17 % — ABNORMAL HIGH (ref 11.5–15.5)
WBC: 6.8 10*3/uL (ref 4.0–10.5)
nRBC: 0 % (ref 0.0–0.2)

## 2019-05-28 LAB — COOXEMETRY PANEL
Carboxyhemoglobin: 1.1 % (ref 0.5–1.5)
Methemoglobin: 0.5 % (ref 0.0–1.5)
O2 Saturation: 53.3 %
Total hemoglobin: 8.3 g/dL — ABNORMAL LOW (ref 12.0–16.0)

## 2019-05-28 LAB — RENAL FUNCTION PANEL
Albumin: 3.1 g/dL — ABNORMAL LOW (ref 3.5–5.0)
Albumin: 3.4 g/dL — ABNORMAL LOW (ref 3.5–5.0)
Anion gap: 10 (ref 5–15)
Anion gap: 9 (ref 5–15)
BUN: 25 mg/dL — ABNORMAL HIGH (ref 8–23)
BUN: 26 mg/dL — ABNORMAL HIGH (ref 8–23)
CO2: 24 mmol/L (ref 22–32)
CO2: 26 mmol/L (ref 22–32)
Calcium: 8.7 mg/dL — ABNORMAL LOW (ref 8.9–10.3)
Calcium: 8.6 mg/dL — ABNORMAL LOW (ref 8.9–10.3)
Chloride: 102 mmol/L (ref 98–111)
Chloride: 102 mmol/L (ref 98–111)
Creatinine, Ser: 0.9 mg/dL (ref 0.61–1.24)
Creatinine, Ser: 1.04 mg/dL (ref 0.61–1.24)
GFR calc Af Amer: >60 mL/min (ref >60)
GFR calc Af Amer: >60 mL/min (ref >60)
GFR calc non Af Amer: >60 mL/min (ref >60)
GFR calc non Af Amer: >60 mL/min (ref >60)
Glucose, Bld: 98 mg/dL (ref 70–99)
Glucose, Bld: 99 mg/dL (ref 70–99)
Phosphorus: 2.5 mg/dL (ref 2.5–4.6)
Phosphorus: 3 mg/dL (ref 2.5–4.6)
Potassium: 4.9 mmol/L (ref 3.5–5.1)
Potassium: 4.8 mmol/L (ref 3.5–5.1)
Sodium: 136 mmol/L (ref 135–145)
Sodium: 137 mmol/L (ref 135–145)

## 2019-05-28 LAB — CULTURE, BLOOD (ROUTINE X 2)
Culture: NO GROWTH
Culture: NO GROWTH
Special Requests: ADEQUATE
Special Requests: ADEQUATE

## 2019-05-28 LAB — POCT I-STAT 7, (LYTES, BLD GAS, ICA,H+H)
Acid-Base Excess: 2 mmol/L (ref 0.0–2.0)
Bicarbonate: 27.6 mmol/L (ref 20.0–28.0)
Calcium, Ion: 1.21 mmol/L (ref 1.15–1.40)
HCT: 29 % — ABNORMAL LOW (ref 39.0–52.0)
Hemoglobin: 9.9 g/dL — ABNORMAL LOW (ref 13.0–17.0)
O2 Saturation: 99 %
Potassium: 4.9 mmol/L (ref 3.5–5.1)
Sodium: 136 mmol/L (ref 135–145)
TCO2: 29 mmol/L (ref 22–32)
pCO2 arterial: 44.3 mmHg (ref 32.0–48.0)
pH, Arterial: 7.402 (ref 7.350–7.450)
pO2, Arterial: 136 mmHg — ABNORMAL HIGH (ref 83.0–108.0)

## 2019-05-28 LAB — GLUCOSE, CAPILLARY
Glucose-Capillary: 109 mg/dL — ABNORMAL HIGH (ref 70–99)
Glucose-Capillary: 129 mg/dL — ABNORMAL HIGH (ref 70–99)
Glucose-Capillary: 139 mg/dL — ABNORMAL HIGH (ref 70–99)
Glucose-Capillary: 85 mg/dL (ref 70–99)
Glucose-Capillary: 86 mg/dL (ref 70–99)
Glucose-Capillary: 87 mg/dL (ref 70–99)

## 2019-05-28 LAB — MAGNESIUM: Magnesium: 2.8 mg/dL — ABNORMAL HIGH (ref 1.7–2.4)

## 2019-05-28 LAB — CULTURE, BODY FLUID W GRAM STAIN -BOTTLE: Culture: NO GROWTH

## 2019-05-28 MED ORDER — SODIUM CHLORIDE 0.9 % IV SOLN: INTRAVENOUS | Status: AC

## 2019-05-28 MED ORDER — ALBUMIN HUMAN 25 % IV SOLN
25.0000 g | Freq: Four times a day (QID) | INTRAVENOUS | Status: AC
  Administered 2019-05-28 (×3): 25 g via INTRAVENOUS
  Filled 2019-05-28 (×3): qty 100

## 2019-05-28 NOTE — Progress Notes (Signed)
Patient ID: Brendan [name], male   DOB: December 06, 1955, 64 y.o.   MRN: [id number] EVENING ROUNDS NOTE :     [address]       [place] [address]             [phone]                 26 Days Post-Op Procedure(s) (LRB): VIDEO BRONCHOSCOPY USING DISPOSABLE ANESTHESIA SCOPE (N/A) TRACHEOSTOMY (N/A)  Total Length of Stay:  LOS: 44 days  BP (!) 93/40   Pulse 74   Temp (!) 95.2 F (35.1 C) (Rectal)   Resp 17   Ht 5\' 11"  (1.803 m) Comment: measured x 3  Wt 54.8 kg   SpO2 100%   BMI 16.85 kg/m   .Intake/Output      05/11 0701 - 05/12 0700 05/12 0701 - 05/13 0700   I.V. (mL/kg) 327.6 (6) 1199.8 (21.9)   NG/GT 1780 940   IV Piggyback 537.8 267.8   Total Intake(mL/kg) 2645.4 (48.3) 2407.6 (43.9)   Other 2643 1085   Stool 0    Chest Tube 20 20   Total Output 2663 1105   Net -17.6 +1302.6        Stool Occurrence 1 x      .  prismasol BGK 4/2.5 500 mL/hr at 05/28/19 0830  .  prismasol BGK 4/2.5 300 mL/hr at 05/28/19 1246  . sodium chloride 10 mL/hr at 05/28/19 1700  . sodium chloride    . albumin human Stopped (05/28/19 1628)  . ceFEPime (MAXIPIME) IV Stopped (05/28/19 1101)  . feeding supplement (VITAL 1.5 CAL) 1,000 mL (05/28/19 1500)  . norepinephrine (LEVOPHED) Adult infusion 5 mcg/min (05/28/19 1700)  . prismasol BGK 4/2.5 1,500 mL/hr at 05/28/19 1322     Lab Results  Component Value Date   WBC 6.8 05/28/2019   HGB 8.9 (L) 05/28/2019   HCT 28.7 (L) 05/28/2019   PLT 170 05/28/2019   GLUCOSE 99 05/28/2019   CHOL 174 08/21/2017   TRIG 143 08/21/2017   HDL 57 08/21/2017   LDLCALC 88 08/21/2017   ALT 15 05/27/2019   AST 29 05/27/2019   NA 137 05/28/2019   K 4.8 05/28/2019   CL 102 05/28/2019   CREATININE 1.04 05/28/2019   BUN 26 (H) 05/28/2019   CO2 26 05/28/2019   TSH 0.925 04/02/2019   INR 1.6 (H) 05/01/2019   HGBA1C 9.0 (H) 03/30/2019   MICROALBUR 30 08/21/2017   Unchanged today On vent   Grace Isaac MD  Beeper  [phone] Office [phone] 05/28/2019 5:48 PM

## 2019-05-28 NOTE — Progress Notes (Signed)
CRRT filter clotted at approximately 1600. Patient disconnected and filter changed, however filter continued to have pressure issues (negative access pressure). Patient disconnected and HD cath ports instilled w/ heparin (1.9 cc per port). New filter and new CRRT machine obtained. Heparin removed from HD cath ports. Patient reconnected and CRRT restarted at [place]. Patient remained stable while off CRRT.   [employer] RN

## 2019-05-28 NOTE — Progress Notes (Signed)
NAME:  Brendan [name], MRN:  [id number], DOB:  11-Jun-1955, LOS: 65 ADMISSION DATE:  03/25/2019, CONSULTATION DATE:  04/17/19 REFERRING MD:  Dr. Haroldine Laws, CHIEF COMPLAINT:  Respiratory distress   Brief History   63 yoM originally presented with SOB and fatigue at OHS found to have new HFrEF, NSTEMI, and bilateral pleural effusions transferred to [facility] on 3/29 for further cardiac evaluation. 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 Started on lasix and milrinone gtts however developed NSVT. Was taken 4/1 for placement of IABP and swan for optimization prior to CABG. Was on precedex for agitation/ confusion, some concern for DTs. On the evening of 4/1, patient developed worsening respiratory distress and hypoxia, PCCM consulted for intubation and vent management.  Patient was extubated on 4/14 and developed worsening hypoxia on BiPAP. Overnight he developed pulseless VT requiring 1 minute of CPR and epinephrine to achieve ROSC. Neurovascularly intact afterward. Subsequently had increased work of breathing and worsening respiratory distress requiring intubation. Patient had tracheostomy performed on 04/23/2019. Trying to wean off ventilator.   Past Medical History  Tobacco abuse, HTN, poorly controlled diabetes, diabetic neuropathy  Significant Hospital Events   3/30 Lt heart cath/TEE performed 4/1 Intubated, RHC/IABP/PA cath 4/2 Impella placement 4/3 febrile over evening and night. Blood cultures obtained. Vanc/cefepime started. Hematuria--heparin held. 4/4 Extubated,abx transitioned to rocephin. Heparin restarted 4/6 Re-intubated for CABG/MVR 4/7 Extubated from CABG to BiPAP 4/9 Impella removed  4/9 TEE 4/10 CVC inserted &CRRT initiated  4/12 PCCMre-consulted re-intubation 4/14 Extubated to BiPAP 4/15 Re-intubated due to respiratory distress 4/16 Tracheostomy 4/27 bronch with BAL 5/7 called back for hypercarbic failure. #6 cuffed placed. Right chest tube placed for enlarging  effusion.  Consults:  TCTS PCCM HF Nephrology  Procedures:  R cephalic double lumen PICC LIJ tunneled HD catheter L brachial arterial line 6-0 cuffed shiley  Significant Diagnostic Tests:  3/30 R/ LHC >>  Ost LM to Mid LM lesion is 35% stenosed.  Prox LAD lesion is 90% stenosed.  Prox LAD to Mid LAD lesion is 50% stenosed.  Mid Cx lesion is 100% stenosed.  Prox RCA to Mid RCA lesion is 90% stenosed.  RPDA lesion is 95% stenosed.  LV end diastolic pressure is severely elevated.  Hemodynamic findings consistent with moderate pulmonary hypertension. 1. Severe 3 vessel obstructive CAD 2. High LV filling pressures 3. Reduced cardiac output with index 2.3 4. Moderate pulmonary HTN.  3/30TTE >> 1. Left ventricular ejection fraction, by estimation, is 30 to 35%. The left ventricle has moderately decreased function. The left ventricle demonstrates regional wall motion abnormalities.There is mild left ventricular hypertrophy. Left ventricular diastolic function could not be evaluated. There is severe hypokinesis of the left ventricular, entire inferior wall, inferoseptal wall, apical segment and lateral wall.  2. Right ventricular systolic function is hyperdynamic. The right ventricular size is normal. There is moderately elevated pulmonary artery systolic pressure.  3. Decreased posterior leaflet motion due to ischemic tethering of the mitral valve leaflets.  4. The mitral valve is abnormal. Moderate to severe mitral valve regurgitation.  5. The aortic valve is tricuspid. Aortic valve regurgitation is not visualized. Mild aortic valve sclerosis is present, with no evidence of aortic valve stenosis.  6. The inferior vena cava is normal in size with greater than 50% respiratory variability, suggesting right atrial pressure of 3 mmHg.   4/1 CT chest w/o >>Large bilateral pleural effusions with associated atelectasis.Moderate to severe bilateral ground-glass opacities,  likely reflecting pneumonia and/or edema.  4/1 RHC >> RA =  18 RV = 60/20  PA = 63/29 (42) PCW = 33 Fick cardiac output/index = 3.7/1.9 PVR = 2.5 WU FA sat = 98% PA sat = 47%, 49% PaPi = 2.2   Micro Data:  5/7 R pleural fluid neg 5/7 blood neg to date 5/6 tracheal aspirate pending  Antimicrobials:    Interim history/subjective:  Awake alert, following commands, has some chest discomfort. One small stool yesterday.  Objective   Blood pressure (!) 128/56, pulse 75, temperature (!) 97.5 F (36.4 C), temperature source Axillary, resp. rate (!) 27, height _0  (1.803 m), weight 54.8 kg, SpO2 100 %. CVP:  [1 mmHg-11 mmHg] 4 mmHg  Vent Mode: PRVC FiO2 (%):  [40 %] 40 % Set Rate:  [30 bmp] 30 bmp Vt Set:  [560 mL] 560 mL PEEP:  [5 cmH20] 5 cmH20 Pressure Support:  [10 cmH20] 10 cmH20 Plateau Pressure:  [23 cmH20-27 cmH20] 23 cmH20   Intake/Output Summary (Last 24 hours) at 05/28/2019 7782 Last data filed at 05/28/2019 0600 Gross per 24 hour  Intake 2492.28 ml  Output 2683 ml  Net -190.72 ml   Filed Weights   05/26/19 0500 05/27/19 0500 05/28/19 0410  Weight: 55.3 kg 55.8 kg 54.8 kg    Examination:   GEN: Frail elderly man on vent HEENT: Tracheostomy in place, secretions a bit bloodier today CV: Heart sounds regular, extremities are warm PULM: transmitted upper airway sounds today, chest tube in place with serous fluid, no air leak GI: Soft, hyperactive bowel sounds, NG tube and rectal in place EXT: Profound muscle wasting with improving superimposed anasarca NEURO: He will move all 4 extremities briskly to command PSYCH: RASS 0 this AM SKIN: He has multiple areas of bruising and skin breakdown  Labs benign CXR stable  Resolved Problems   Agitated delirium- improved - seroquel qHS, continue melatonin - continue PRN oxycodone - re-orient, encourage day/night cycles  Assessment & Plan:   Recurrent hypoxemic and hypercarbic respiratory failure after  CABG now s/p trach.  Multifactorial.  See discussion in 5/8 note.  Enterobacter in sputum being treated.  - PS trials during day, vent at night - Chest tube management per primary, can place pigtail in left pleural space PRN - cefepime x 7 days  Shock-again multifactorial.  See discussion in 5/8 note. I think at this point it is just cardiogenic and renal vasoplegic. - Continue pressors and high dose midodrine, not sure what else to do here, goal MAP 60, improving slowly  ESRD- continue CRRT, eventual goal to transition  Constipation - Had small BM today, continue to monitor on current regimen  Dysphagia- continue NGT+ TF  Blood sugars- improved  Profound muscular deconditioning- biggest barrier to recovery at this point  Goals of care- continue current level of care with goal of CIR with iHD, palliative has established baseline   Best practice:  Diet: Tube feeds Pain/Anxiety/Delirium protocol (if indicated): see above VAP protocol (if indicated):HOB 30 degrees  DVT prophylaxis: ASA 355m GI prophylaxis: PPI Glucose control:SSI Mobility: BR Code Status: Full  Family Communication:per primary team Disposition:ICU pending CRRT and vent liberation  DErskine EmeryMD LWinonaPulmonary Critical Care 05/28/2019 7:14 AM Personal pager: [phone] unanswered, please page CCM On-call: #[phone]

## 2019-05-28 NOTE — Progress Notes (Signed)
Patient ID: Brendan [name], male   DOB: 03/05/1955, 64 y.o.   MRN: [id number]     Advanced Heart Failure Rounding Note  PCP-Cardiologist: No primary care provider on file.   Subjective:    Remains on vent and CRRT   NE at 7. Co-ox down to 53%  More alert today. Sitting up in bed following commands.   Seen by Palliative Care team. Remains full code  Objective:   Weight Range: 54.8 kg Body mass index is 16.85 kg/m.   Vital Signs:   Temp:  [96.2 F (35.7 C)-97.5 F (36.4 C)] 96.2 F (35.7 C) (05/12 0730) Pulse Rate:  [68-79] 68 (05/12 1330) Resp:  [9-30] 9 (05/12 1330) BP: (93-111)/(40-46) 93/40 (05/12 1133) SpO2:  [95 %-100 %] 100 % (05/12 1330) Arterial Line BP: (79-149)/(37-60) 99/44 (05/12 1330) FiO2 (%):  [40 %] 40 % (05/12 1133) Weight:  [54.8 kg] 54.8 kg (05/12 0410) Last BM Date: 05/27/19  Weight change: Filed Weights   05/26/19 0500 05/27/19 0500 05/28/19 0410  Weight: 55.3 kg 55.8 kg 54.8 kg    Intake/Output:   Intake/Output Summary (Last 24 hours) at 05/28/2019 1408 Last data filed at 05/28/2019 1300 Gross per 24 hour  Intake 3622.9 ml  Output 2503 ml  Net 1119.9 ml      Physical Exam   General:  Thin cachetic. On vent through trach  HEENT: normal Neck: supple. no JVD. + trach with mild secretions Carotids 2+ bilat; no bruits. No lymphadenopathy or thryomegaly appreciated. Cor: sternal wound ok PMI nondisplaced. Regular rate & rhythm. No rubs, gallops or murmurs. + tunneled cath  Lungs: Coarse  Abdomen: soft, nontender, nondistended. No hepatosplenomegaly. No bruits or masses. Good bowel sounds. Extremities: no cyanosis, clubbing, rash, tr edema Neuro: alert & orientedx3, cranial nerves grossly intact. moves all 4 extremities w/o difficulty. Affect pleasant  Telemetry   NSR 60-70s Personally reviewed   Labs    CBC Recent Labs    05/27/19 0530 05/27/19 0814 05/28/19 0408 05/28/19 0417  WBC 8.7  --   --  6.8  HGB 9.0*   < > 9.9*  8.9*  HCT 29.5*   < > 29.0* 28.7*  MCV 97.4  --   --  98.0  PLT 205  --   --  170   < > = values in this interval not displayed.   Basic Metabolic Panel Recent Labs    05/27/19 0530 05/27/19 0814 05/27/19 1550 05/27/19 1550 05/28/19 0408 05/28/19 0417  NA 137   < > 136   < > 136 136  K 4.5   < > 4.9   < > 4.9 4.9  CL 102   < > 100  --   --  102  CO2 24   < > 25  --   --  24  GLUCOSE 130*   < > 197*  --   --  98  BUN 28*   < > 29*  --   --  25*  CREATININE 1.15   < > 1.07  --   --  0.90  CALCIUM 9.1   < > 8.5*  --   --  8.7*  MG 2.9*  --   --   --   --  2.8*  PHOS 1.3*   < > 3.7  --   --  2.5   < > = values in this interval not displayed.   Liver Function Tests Recent Labs    05/27/19 0530 05/27/19  0530 05/27/19 1550 05/28/19 0417  AST 29  --   --   --   ALT 15  --   --   --   ALKPHOS 294*  --   --   --   BILITOT 0.9  --   --   --   PROT 7.1  --   --   --   ALBUMIN 3.2*  3.2*   < > 3.0* 3.1*   < > = values in this interval not displayed.   No results for input(s): LIPASE, AMYLASE in the last 72 hours. Cardiac Enzymes No results for input(s): CKTOTAL, CKMB, CKMBINDEX, TROPONINI in the last 72 hours.  BNP: BNP (last 3 results) Recent Labs    04/11/2019 0113  BNP 655.7*    ProBNP (last 3 results) No results for input(s): PROBNP in the last 8760 hours.   D-Dimer No results for input(s): DDIMER in the last 72 hours. Hemoglobin A1C No results for input(s): HGBA1C in the last 72 hours. Fasting Lipid Panel No results for input(s): CHOL, HDL, LDLCALC, TRIG, CHOLHDL, LDLDIRECT in the last 72 hours. Thyroid Function Tests No results for input(s): TSH, T4TOTAL, T3FREE, THYROIDAB in the last 72 hours.  Invalid input(s): FREET3  Other results:   Imaging    DG Chest Port 1 View  Result Date: 05/28/2019 CLINICAL DATA:  CABG. EXAM: PORTABLE CHEST 1 VIEW COMPARISON:  Fist x-ray from yesterday. FINDINGS: Unchanged tracheostomy and feeding tubes. Unchanged  tunneled left internal jugular dialysis catheter and right upper extremity PICC line. Unchanged right chest tube. Stable cardiomediastinal silhouette status post CABG and mitral valve repair. Diffuse interstitial and scattered patchy airspace opacities are similar to prior study. No pneumothorax or large pleural effusion. No acute osseous abnormality. IMPRESSION: 1. Unchanged bilateral interstitial and airspace disease. Electronically Signed   By: Titus Dubin M.D.   On: 05/28/2019 07:47     Medications:     Scheduled Medications: . aspirin EC  325 mg Oral Daily   Or  . aspirin  324 mg Per Tube Daily  . B-complex with vitamin C  1 tablet Per Tube Daily  . chlorhexidine gluconate (MEDLINE KIT)  15 mL Mouth Rinse BID  . Chlorhexidine Gluconate Cloth  6 each Topical Q0600  . citalopram  10 mg Per Tube Daily  . darbepoetin (ARANESP) injection - NON-DIALYSIS  100 mcg Subcutaneous Q Tue-1800  . docusate  200 mg Per Tube Daily  . feeding supplement (PRO-STAT SUGAR FREE 64)  30 mL Per Tube BID  . gabapentin  100 mg Oral Q8H  . insulin aspart  1 Units Subcutaneous Q4H  . insulin glargine  8 Units Subcutaneous BID  . mouth rinse  15 mL Mouth Rinse 10 times per day  . melatonin  3 mg Per Tube QHS  . midodrine  20 mg Per Tube Q8H  . neomycin-polymyxin-hydrocortisone  3 drop Left EAR Q12H  . pantoprazole sodium  40 mg Per Tube BID  . polyethylene glycol  17 g Oral Daily  . QUEtiapine  25 mg Oral QHS  . rOPINIRole  0.5 mg Oral QHS  . sennosides  5 mL Per Tube BID  . sodium chloride flush  10-40 mL Intracatheter Q12H    Infusions: .  prismasol BGK 4/2.5 500 mL/hr at 05/28/19 0830  .  prismasol BGK 4/2.5 300 mL/hr at 05/28/19 1246  . sodium chloride 10 mL/hr at 05/28/19 1300  . sodium chloride    . albumin human Stopped (05/28/19 1011)  .  ceFEPime (MAXIPIME) IV Stopped (05/28/19 1101)  . feeding supplement (VITAL 1.5 CAL) Stopped (05/28/19 1200)  . norepinephrine (LEVOPHED) Adult infusion  5 mcg/min (05/28/19 1300)  . prismasol BGK 4/2.5 1,500 mL/hr at 05/28/19 1322    PRN Medications: Place/Maintain arterial line **AND** sodium chloride, sodium chloride, alum & mag hydroxide-simeth, dextrose, diphenhydrAMINE, fentaNYL (SUBLIMAZE) injection, Gerhardt's butt cream, heparin, heparin, levalbuterol, ondansetron (ZOFRAN) IV, oxyCODONE, pneumococcal 23 valent vaccine, sodium chloride flush, sorbitol     Assessment/Plan   1. Acute systolic HF ->  Cardiogenic Shock  - Due to iCM. EF 20-25% - Impella 5.5 placed on 4/2.   - s/p CABG/MVRepair on 4/6  - Impella out 4/9.  - Echo 4/10 with EF 25-30%, normal RV, no MR.  - Echo 4/22 EF 30-35% mildly reduced RV MVR ok   - CO-OX 53% on Norepi 7 mcg + midodrine 20 mg tid. Will try to wean NE slowly but when we did this over the w/e co-ox dropped into 20-30% range. Follow closely   2. 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 Echo stable - No s/s ischemia - On ASA/Crestor  3. Acute hypoxic respiratory failure - Due to pulmonary edema and PNA - Extubated 4/9 re-intubated on 4/12. Failed re-extubation on 4/14. Now s/p tracheostom - S/P Bronch 4/27 - 60K yeast. -> ? Colonization.  - CT scan 5/7 with diffuse bilateral infiltrates c/w ARDS and large bilateral effusions R>L.  - R chest tube placed with > 1L out. - Remains on vent FiO2 40% - CCM following. Now weaning to PS during day and full vent at ngiht   4. Mitral Regurgitation - Mod-severe on ECHO - s/p MV repair, TEE 4/9 with minimal MR s/p repair.   - Echo 4/10 with no significant MR.  - echo 4/22 MVR stable - No change  5. Uncontrolled DM - Hgb A1C 9.  - On insulin and sliding scale.  - No change  6. AKI - due to shock - Remains anuric post-op - Continue CVVHD per Nephrology. Pulling - 50.   7. Acute blood loss Anemia  - Transfused 1UPRBCs 05/25/19 Hgb 7.1>8.1>10.2  - No obvious bleeding.   8. Polymorphic VT - Recurrent VT during  respiratory distress 4/14, - Off amio  - No further VT.  - Keep K> 4.0 Mg > 2.0  - Rhythm stable.   9. Severe Malnutrition - Prealbumin improved, up from 8.9>>18.7 - Nutrition on board - Continue w/ TFs  10. Atrial fibrillation. paroxysmal - Remains in NSR.  - Amiodarone was stopped.   11. Debility, severe - as above will likely need LTAC on d/c  12. Chronic pain issues - pain meds being tapered   Remains very tenuous. Palliative care has seen. Remains full go. I am worried he may be inotrope dependent.    CRITICAL CARE Performed by: Glori Bickers  Total critical care time: 35 minutes  Critical care time was exclusive of separately billable procedures and treating other patients.  Critical care was necessary to treat or prevent imminent or life-threatening deterioration.  Critical care was time spent personally by me (independent of midlevel providers or residents) on the following activities: development of treatment plan with patient and/or surrogate as well as nursing, discussions with consultants, evaluation of patient's response to treatment, examination of patient, obtaining history from patient or surrogate, ordering and performing treatments and interventions, ordering and review of laboratory studies, ordering and review of radiographic studies,  pulse oximetry and re-evaluation of patient's condition.     Glori Bickers, MD  2:12 PM

## 2019-05-28 NOTE — Progress Notes (Addendum)
[facility] DAILY ICU PROGRESS NOTE                   [address]            [place] [id number]          [phone]   26 Days Post-Op Procedure(s) (LRB): VIDEO BRONCHOSCOPY USING DISPOSABLE ANESTHESIA SCOPE (N/A) TRACHEOSTOMY (N/A)  Total Length of Stay:  LOS: 44 days   Subjective: Patient more alert this am. He is on vent and receiving CRRT this am  Objective: Vital signs in last 24 hours: Temp:  [97.5 F (36.4 C)-97.6 F (36.4 C)] 97.5 F (36.4 C) (05/12 0000) Pulse Rate:  [73-86] 75 (05/12 0600) Cardiac Rhythm: Normal sinus rhythm (05/11 2013) Resp:  [11-30] 27 (05/12 0600) SpO2:  [98 %-100 %] 100 % (05/12 0600) Arterial Line BP: (79-149)/(36-60) 107/47 (05/12 0600) FiO2 (%):  [40 %] 40 % (05/12 0402) Weight:  [54.8 kg] 54.8 kg (05/12 0410)  Filed Weights   05/26/19 0500 05/27/19 0500 05/28/19 0410  Weight: 55.3 kg 55.8 kg 54.8 kg    Weight change: -1 kg   CVP:  [1 mmHg-11 mmHg] 4 mmHg  Intake/Output from previous day: 05/11 0701 - 05/12 0700 In: 2525.4 [I.V.:327.6; NG/GT:1660; IV Piggyback:537.8] Out: 2663 [Chest Tube:20]  Intake/Output this shift: No intake/output data recorded.  Current Meds: Scheduled Meds: . aspirin EC  325 mg Oral Daily   Or  . aspirin  324 mg Per Tube Daily  . B-complex with vitamin C  1 tablet Per Tube Daily  . chlorhexidine gluconate (MEDLINE KIT)  15 mL Mouth Rinse BID  . Chlorhexidine Gluconate Cloth  6 each Topical Q0600  . citalopram  10 mg Per Tube Daily  . darbepoetin (ARANESP) injection - NON-DIALYSIS  100 mcg Subcutaneous Q Tue-1800  . docusate  200 mg Per Tube Daily  . feeding supplement (PRO-STAT SUGAR FREE 64)  30 mL Per Tube BID  . gabapentin  100 mg Oral Q8H  . insulin aspart  1 Units Subcutaneous Q4H  . insulin glargine  8 Units Subcutaneous BID  . mouth rinse  15 mL Mouth Rinse 10 times per day  . melatonin  3 mg Per Tube QHS  . midodrine  20 mg Per Tube Q8H  . neomycin-polymyxin-hydrocortisone   3 drop Left EAR Q12H  . pantoprazole sodium  40 mg Per Tube BID  . polyethylene glycol  17 g Oral Daily  . QUEtiapine  25 mg Oral QHS  . rOPINIRole  0.5 mg Oral QHS  . sennosides  5 mL Per Tube BID  . sodium chloride flush  10-40 mL Intracatheter Q12H   Continuous Infusions: .  prismasol BGK 4/2.5 500 mL/hr at 05/27/19 2207  .  prismasol BGK 4/2.5 300 mL/hr at 05/27/19 0254  . sodium chloride 10 mL/hr at 05/28/19 0700  . sodium chloride    . sodium chloride    . ceFEPime (MAXIPIME) IV Stopped (05/27/19 2136)  . feeding supplement (VITAL 1.5 CAL) 1,000 mL (05/27/19 1637)  . norepinephrine (LEVOPHED) Adult infusion 7 mcg/min (05/28/19 0700)  . prismasol BGK 4/2.5 1,500 mL/hr at 05/28/19 0623   PRN Meds:.Place/Maintain arterial line **AND** sodium chloride, sodium chloride, alum & mag hydroxide-simeth, dextrose, diphenhydrAMINE, fentaNYL (SUBLIMAZE) injection, Gerhardt's butt cream, heparin, heparin, levalbuterol, ondansetron (ZOFRAN) IV, oxyCODONE, pneumococcal 23 valent vaccine, sodium chloride flush, sorbitol  Heart: RRR Lungs: Less rhonchus than previously Abdomen: Soft,  bowel sounds present Extremities: No LE edema Wound: Clean  and dry.  Right chest tube: Yellow pleural fluid draining  Lab Results: CBC: Recent Labs    05/27/19 0530 05/27/19 0814 05/28/19 0408 05/28/19 0417  WBC 8.7  --   --  6.8  HGB 9.0*   < > 9.9* 8.9*  HCT 29.5*   < > 29.0* 28.7*  PLT 205  --   --  170   < > = values in this interval not displayed.   BMET:  Recent Labs    05/27/19 1550 05/27/19 1550 05/28/19 0408 05/28/19 0417  NA 136   < > 136 136  K 4.9   < > 4.9 4.9  CL 100  --   --  102  CO2 25  --   --  24  GLUCOSE 197*  --   --  98  BUN 29*  --   --  25*  CREATININE 1.07  --   --  0.90  CALCIUM 8.5*  --   --  8.7*   < > = values in this interval not displayed.    CMET: Lab Results  Component Value Date   WBC 6.8 05/28/2019   HGB 8.9 (L) 05/28/2019   HCT 28.7 (L) 05/28/2019     PLT 170 05/28/2019   GLUCOSE 98 05/28/2019   CHOL 174 08/21/2017   TRIG 143 08/21/2017   HDL 57 08/21/2017   LDLCALC 88 08/21/2017   ALT 15 05/27/2019   AST 29 05/27/2019   NA 136 05/28/2019   K 4.9 05/28/2019   CL 102 05/28/2019   CREATININE 0.90 05/28/2019   BUN 25 (H) 05/28/2019   CO2 24 05/28/2019   TSH 0.925 04/07/2019   INR 1.6 (H) 05/01/2019   HGBA1C 9.0 (H) 03/31/2019   MICROALBUR 30 08/21/2017    PT/INR:  No results for input(s): LABPROT, INR in the last 72 hours. Radiology: No results found.  Assessment/Plan: S/P Procedure(s) (LRB): VIDEO BRONCHOSCOPY USING DISPOSABLE ANESTHESIA SCOPE (N/A) TRACHEOSTOMY (N/A)  1. CV-S/p removal of Impella on 04/09.  S/p V tach arrest 04/15. Previous a fib. SR with HR in the 80's this am.  On  Midodrine 20 mg tid and Nor epinephrine 7 mcg/min.Marland Kitchen Receiving intermittent CVVHD and remains pressor dependent.   2. Pulmonary-S/p trach 04/16.  Right chest tube with 20 last 24 hours. CXR this am appears stable.  Hope to remove chest tube. 3. Expected post op blood loss anemia-H and H yesterday slightly increased to 8.9 and 28.7. Continue Aranesp 4. DM-CBGs 149/129/86. On Insulin. He is on Insulin and this has been decreased to avoid hypoglycemia. He was on Metformin 1000 mg bid prior to surgery, but will continue on Insulin for now as NPO. Pre op HGA1C 9. He will need close medical follow up after discharge 5. Oliguric/anuric AKI-Creatinine 0.90 this am. Nephrology following and arranging for CRRT accordingly. Per nephrology, continue CRRT as long as no UOP and needs pressors 6. GI-severe malnutrition of chronic illness.  Cortrak, TFs. Speech pathology evaluation  Yesterday and recommended PEG. Albumin 3.1 7. Acute systolic heart failure-CVVHD helping with volume status 8. Previous elevated transaminases, AST 29, ALT 15 and alk phos down to 294. 9. Extremely deconditioned-will need PT as able.  10. Await results of palliative care meeting  with family  Nani Skillern [facility] 05/28/2019 7:23 AM   Patient had a stable night, chest x-ray this a.m. looks improved and he is breathing comfortably.  Hopefully can transition to trach collar today and then rest tonight.  Continue  CRRT until patient is off norepinephrine.  We will slowly wean and support volume with albumin.  Decrease norepinephrine 1 mcg every 12 hours.  patient examined and medical record reviewed,agree with above note. [name] 05/28/2019

## 2019-05-28 NOTE — Progress Notes (Signed)
Patient ID: Brendan [name], male   DOB: 1955/08/04, 64 y.o.   MRN: [id number]    S: No significant events overnight- hypothermic, remains on low dose pressors- no UOP- essentially ran even with CRRT - appropriate given low CVP  O:BP (!) 128/56 (BP Location: Other (Comment)) Comment (BP Location): aline  Pulse 75   Temp (!) 97.5 F (36.4 C) (Axillary)   Resp (!) 27   Ht 5' 11"  (1.803 m) Comment: measured x 3  Wt 54.8 kg   SpO2 100%   BMI 16.85 kg/m   Intake/Output Summary (Last 24 hours) at 05/28/2019 0659 Last data filed at 05/28/2019 0600 Gross per 24 hour  Intake 2501.27 ml  Output 2773 ml  Net -271.73 ml   Intake/Output: I/O last 3 completed shifts: In: 2718.8 [I.V.:337.5; NG/GT:1780; IV Piggyback:601.3] Out: 1443 [Other:5060; Chest Tube:190]  Intake/Output this shift:  Total I/O In: 904 [I.V.:167.4; NG/GT:600; IV Piggyback:136.5] Out: 406 [Other:406] Weight change: -1 kg Gen: frail, cachectic WM on vent via trach- left IJ vascath placed 5/3 CVS: RRR Resp: decreased BS at bases Abd: +BS, soft, NT/ND Ext: no edema- atrophied legs   Recent Labs  Lab 05/25/19 0400 05/25/19 0437 05/25/19 1605 05/25/19 1614 05/26/19 0445 05/26/19 1638 05/27/19 0530 05/27/19 0814 05/27/19 1550 05/28/19 0408 05/28/19 0417  NA 135   < > 136   < > 138 137 137 137 136 136 136  K 5.3*   < > 5.0   < > 4.6 5.0 4.5 4.8 4.9 4.9 4.9  CL 99  --  101  --  102 101 102  --  100  --  102  CO2 25  --  25  --  25 27 24   --  25  --  24  GLUCOSE 213*  --  180*  --  78 165* 130*  --  197*  --  98  BUN 35*  --  30*  --  28* 26* 28*  --  29*  --  25*  CREATININE 1.36*  --  1.20  --  1.03 1.05 1.15  --  1.07  --  0.90  ALBUMIN 3.2*  --  3.3*  --  3.9 3.4* 3.2*  3.2*  --  3.0*  --  3.1*  CALCIUM 8.9  --  8.9  --  9.1 8.8* 9.1  --  8.5*  --  8.7*  PHOS 3.6  --  2.8  --  2.1* 2.0* 1.3*  --  3.7  --  2.5  AST  --   --   --   --   --   --  29  --   --   --   --   ALT  --   --   --   --   --   --  15   --   --   --   --    < > = values in this interval not displayed.   Liver Function Tests: Recent Labs  Lab 05/27/19 0530 05/27/19 1550 05/28/19 0417  AST 29  --   --   ALT 15  --   --   ALKPHOS 294*  --   --   BILITOT 0.9  --   --   PROT 7.1  --   --   ALBUMIN 3.2*  3.2* 3.0* 3.1*   No results for input(s): LIPASE, AMYLASE in the last 168 hours. No results for input(s): AMMONIA in the last 168 hours. CBC:  Recent Labs  Lab 05/24/19 0539 05/24/19 1849 05/25/19 0400 05/25/19 0437 05/26/19 0445 05/26/19 0445 05/27/19 0530 05/27/19 0530 05/27/19 0814 05/28/19 0408 05/28/19 0417  WBC 9.8  --  8.5   < > 9.8  --  8.7  --   --   --  6.8  HGB 8.1*   < > 7.1*   < > 8.1*   < > 9.0*   < > 10.2* 9.9* 8.9*  HCT 26.1*   < > 23.6*   < > 26.2*   < > 29.5*   < > 30.0* 29.0* 28.7*  MCV 99.2  --  100.0  --  96.7  --  97.4  --   --   --  98.0  PLT 162  --  157   < > 193  --  205  --   --   --  170   < > = values in this interval not displayed.   Cardiac Enzymes: No results for input(s): CKTOTAL, CKMB, CKMBINDEX, TROPONINI in the last 168 hours. CBG: Recent Labs  Lab 05/27/19 1123 05/27/19 1544 05/27/19 2008 05/28/19 0013 05/28/19 0403  GLUCAP 203* 181* 149* 129* 86    Iron Studies: No results for input(s): IRON, TIBC, TRANSFERRIN, FERRITIN in the last 72 hours. Studies/Results: DG Chest Port 1 View  Result Date: 05/27/2019 CLINICAL DATA:  Mitral valve replacement EXAM: PORTABLE CHEST 1 VIEW COMPARISON:  Yesterday FINDINGS: Tracheostomy tube in place. Feeding tube that at least reaches the stomach. Right central line and left-sided dialysis catheter. Right-sided PICC. This hardware is in stable unremarkable position. CABG and mitral valve repair. Diffuse interstitial opacity with improvement suggesting edema. Small pleural effusions may be present. No visible pneumothorax IMPRESSION: 1. Stable hardware positioning. 2. Improved edema. Electronically Signed   By: Monte Fantasia M.D.    On: 05/27/2019 07:04   . aspirin EC  325 mg Oral Daily   Or  . aspirin  324 mg Per Tube Daily  . B-complex with vitamin C  1 tablet Per Tube Daily  . chlorhexidine gluconate (MEDLINE KIT)  15 mL Mouth Rinse BID  . Chlorhexidine Gluconate Cloth  6 each Topical Q0600  . citalopram  10 mg Per Tube Daily  . darbepoetin (ARANESP) injection - NON-DIALYSIS  100 mcg Subcutaneous Q Tue-1800  . docusate  200 mg Per Tube Daily  . feeding supplement (PRO-STAT SUGAR FREE 64)  30 mL Per Tube BID  . gabapentin  100 mg Oral Q8H  . insulin aspart  1 Units Subcutaneous Q4H  . insulin glargine  8 Units Subcutaneous BID  . mouth rinse  15 mL Mouth Rinse 10 times per day  . melatonin  3 mg Per Tube QHS  . midodrine  20 mg Per Tube Q8H  . neomycin-polymyxin-hydrocortisone  3 drop Left EAR Q12H  . pantoprazole sodium  40 mg Per Tube BID  . polyethylene glycol  17 g Oral Daily  . QUEtiapine  25 mg Oral QHS  . rOPINIRole  0.5 mg Oral QHS  . sennosides  5 mL Per Tube BID  . sodium chloride flush  10-40 mL Intracatheter Q12H    BMET    Component Value Date/Time   NA 136 05/28/2019 0417   K 4.9 05/28/2019 0417   CL 102 05/28/2019 0417   CO2 24 05/28/2019 0417   GLUCOSE 98 05/28/2019 0417   BUN 25 (H) 05/28/2019 0417   CREATININE 0.90 05/28/2019 0417   CALCIUM 8.7 (L) 05/28/2019 0034  GFRNONAA >60 05/28/2019 0417   GFRAA >60 05/28/2019 0417   CBC    Component Value Date/Time   WBC 6.8 05/28/2019 0417   RBC 2.93 (L) 05/28/2019 0417   HGB 8.9 (L) 05/28/2019 0417   HCT 28.7 (L) 05/28/2019 0417   PLT 170 05/28/2019 0417   MCV 98.0 05/28/2019 0417   MCH 30.4 05/28/2019 0417   MCHC 31.0 05/28/2019 0417   RDW 17.0 (H) 05/28/2019 0417   LYMPHSABS 1.1 04/30/2019 0402   MONOABS 1.1 (H) 04/24/2019 0402   EOSABS 0.2 04/26/2019 0402   BASOSABS 0.0 05/10/2019 0402    Brief HPI: admitted to outside hospital on 3/26 with SOB, new low EF and NSTEMI txd to [facility] 3/29: lhc 3 vessel CAD s/p CABG and MVR  complicated by cardiogenic shock, IABP, Impella, and worsening volume status/respiratory status and started on CRRT 04/26/19. Admission Scr was 0.99.  Assessment/Plan:  1. Acute hypoxic respiratory failure on 05/23/19- worsening infiltrates on CT and bilateral pleural effusions.  S/p right chest tube    2. Oliguric/anuricAKI- CRRT started on 4/10 and stopped 4/29. Transitioned to IHD on 05/17/19 but failed and now back on CRRT 1. Remains oliguric/anuric. 2. Resumed CRRT on 05/23/19 following acute worsening of his oxygenation and hypotension.    3. Dialysate 4K/2.5 Ca for replacement fluids and dialysate.  no heparin.  Running even.  Will cont to run even and also will bolus outside of machine 4. Will keep on CRRT as long as no UOP and req pressors   3. Vascular access- leftIJ tunneled HD catheter placed by IR on 05/19/19- now 97 days old 4. Anemia- pretty stable after one unit on 5/9- added ESA -  Dropped a little today  5. CAD- 3 vessel with EF 25% s/p CABG and MVR 06/17/01 complicated by cardiogenic shock 6. Acute systolic CHF and cardiogenic shock- still on pressors. Volume improved with CRRT/IHD.  On pretty max midodrine as well.  Am going to give a liter today outside of the machine in attempt to get off pressors 7. [place]-  Phos OK -  K is OK- discovered he was on binder !   Has been stopped    8. DM- per primary 9. 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 Severe debility- will need LTAC if survives. [place] meeting possibly in the works  [place] [phone]

## 2019-05-29 ENCOUNTER — Inpatient Hospital Stay (HOSPITAL_COMMUNITY): Payer: Medicaid Other

## 2019-05-29 DIAGNOSIS — I255 Ischemic cardiomyopathy: Secondary | ICD-10-CM

## 2019-05-29 LAB — POCT I-STAT 7, (LYTES, BLD GAS, ICA,H+H)
Acid-Base Excess: 3 mmol/L — ABNORMAL HIGH (ref 0.0–2.0)
Bicarbonate: 29.1 mmol/L — ABNORMAL HIGH (ref 20.0–28.0)
Calcium, Ion: 1.23 mmol/L (ref 1.15–1.40)
HCT: 25 % — ABNORMAL LOW (ref 39.0–52.0)
Hemoglobin: 8.5 g/dL — ABNORMAL LOW (ref 13.0–17.0)
O2 Saturation: 99 %
Potassium: 5.3 mmol/L — ABNORMAL HIGH (ref 3.5–5.1)
Sodium: 136 mmol/L (ref 135–145)
TCO2: 31 mmol/L (ref 22–32)
pCO2 arterial: 50.6 mmHg — ABNORMAL HIGH (ref 32.0–48.0)
pH, Arterial: 7.367 (ref 7.350–7.450)
pO2, Arterial: 131 mmHg — ABNORMAL HIGH (ref 83.0–108.0)

## 2019-05-29 LAB — RENAL FUNCTION PANEL
Albumin: 3.3 g/dL — ABNORMAL LOW (ref 3.5–5.0)
Albumin: 3.2 g/dL — ABNORMAL LOW (ref 3.5–5.0)
Anion gap: 8 (ref 5–15)
Anion gap: 8 (ref 5–15)
BUN: 20 mg/dL (ref 8–23)
BUN: 17 mg/dL (ref 8–23)
CO2: 27 mmol/L (ref 22–32)
CO2: 27 mmol/L (ref 22–32)
Calcium: 8.5 mg/dL — ABNORMAL LOW (ref 8.9–10.3)
Calcium: 8.5 mg/dL — ABNORMAL LOW (ref 8.9–10.3)
Chloride: 102 mmol/L (ref 98–111)
Chloride: 105 mmol/L (ref 98–111)
Creatinine, Ser: 0.99 mg/dL (ref 0.61–1.24)
Creatinine, Ser: 0.93 mg/dL (ref 0.61–1.24)
GFR calc Af Amer: >60 mL/min (ref >60)
GFR calc Af Amer: >60 mL/min (ref >60)
GFR calc non Af Amer: >60 mL/min (ref >60)
GFR calc non Af Amer: >60 mL/min (ref >60)
Glucose, Bld: 88 mg/dL (ref 70–99)
Glucose, Bld: 153 mg/dL — ABNORMAL HIGH (ref 70–99)
Phosphorus: 2.5 mg/dL (ref 2.5–4.6)
Phosphorus: 2.2 mg/dL — ABNORMAL LOW (ref 2.5–4.6)
Potassium: 5.1 mmol/L (ref 3.5–5.1)
Potassium: 4.8 mmol/L (ref 3.5–5.1)
Sodium: 137 mmol/L (ref 135–145)
Sodium: 140 mmol/L (ref 135–145)

## 2019-05-29 LAB — CBC
HCT: 25 % — ABNORMAL LOW (ref 39.0–52.0)
Hemoglobin: 7.6 g/dL — ABNORMAL LOW (ref 13.0–17.0)
MCH: 30.6 pg (ref 26.0–34.0)
MCHC: 30.4 g/dL (ref 30.0–36.0)
MCV: 100.8 fL — ABNORMAL HIGH (ref 80.0–100.0)
Platelets: 132 10*3/uL — ABNORMAL LOW (ref 150–400)
RBC: 2.48 MIL/uL — ABNORMAL LOW (ref 4.22–5.81)
RDW: 16.7 % — ABNORMAL HIGH (ref 11.5–15.5)
WBC: 9.6 10*3/uL (ref 4.0–10.5)
nRBC: 0 % (ref 0.0–0.2)

## 2019-05-29 LAB — GLUCOSE, CAPILLARY
Glucose-Capillary: 107 mg/dL — ABNORMAL HIGH (ref 70–99)
Glucose-Capillary: 140 mg/dL — ABNORMAL HIGH (ref 70–99)
Glucose-Capillary: 153 mg/dL — ABNORMAL HIGH (ref 70–99)
Glucose-Capillary: 24 mg/dL — CL (ref 70–99)
Glucose-Capillary: 34 mg/dL — CL (ref 70–99)
Glucose-Capillary: 48 mg/dL — ABNORMAL LOW (ref 70–99)
Glucose-Capillary: 64 mg/dL — ABNORMAL LOW (ref 70–99)
Glucose-Capillary: 97 mg/dL (ref 70–99)
Glucose-Capillary: 98 mg/dL (ref 70–99)

## 2019-05-29 LAB — COOXEMETRY PANEL
Carboxyhemoglobin: 1.5 % (ref 0.5–1.5)
Methemoglobin: 1.3 % (ref 0.0–1.5)
O2 Saturation: 49 %
Total hemoglobin: 8.7 g/dL — ABNORMAL LOW (ref 12.0–16.0)

## 2019-05-29 LAB — ECHOCARDIOGRAM LIMITED
Height: 71 in
Weight: 2003.54 oz

## 2019-05-29 LAB — MAGNESIUM: Magnesium: 2.5 mg/dL — ABNORMAL HIGH (ref 1.7–2.4)

## 2019-05-29 MED ORDER — METHYLNALTREXONE BROMIDE 12 MG/0.6ML ~~LOC~~ SOLN
12.0000 mg | Freq: Once | SUBCUTANEOUS | Status: AC
  Administered 2019-05-29: 12 mg via SUBCUTANEOUS
  Filled 2019-05-29: qty 0.6

## 2019-05-29 MED ORDER — INSULIN ASPART 100 UNIT/ML ~~LOC~~ SOLN
0.0000 [IU] | SUBCUTANEOUS | Status: DC
  Administered 2019-05-29 – 2019-06-09 (×10): 1 [IU] via SUBCUTANEOUS
  Administered 2019-06-10: 2 [IU] via SUBCUTANEOUS
  Administered 2019-06-10 – 2019-06-18 (×25): 1 [IU] via SUBCUTANEOUS
  Administered 2019-06-18: 2 [IU] via SUBCUTANEOUS
  Administered 2019-06-19: 1 [IU] via SUBCUTANEOUS
  Administered 2019-06-19 (×2): 2 [IU] via SUBCUTANEOUS
  Administered 2019-06-20 (×2): 1 [IU] via SUBCUTANEOUS
  Administered 2019-06-20: 2 [IU] via SUBCUTANEOUS
  Administered 2019-06-21: 5 [IU] via SUBCUTANEOUS

## 2019-05-29 MED ORDER — SODIUM CHLORIDE 0.9 % IV SOLN: INTRAVENOUS | Status: DC

## 2019-05-29 MED ORDER — DEXTROSE 50 % IV SOLN
25.0000 mL | Freq: Once | INTRAVENOUS | Status: AC
  Administered 2019-05-29: 25 mL via INTRAVENOUS

## 2019-05-29 MED ORDER — DEXTROSE-NACL 5-0.9 % IV SOLN: INTRAVENOUS | Status: DC

## 2019-05-29 MED ORDER — ALBUMIN HUMAN 25 % IV SOLN
12.5000 g | Freq: Four times a day (QID) | INTRAVENOUS | Status: AC
  Administered 2019-05-29 – 2019-05-30 (×6): 12.5 g via INTRAVENOUS
  Filled 2019-05-29 (×6): qty 50

## 2019-05-29 MED ORDER — METOCLOPRAMIDE HCL 5 MG/ML IJ SOLN
10.0000 mg | Freq: Four times a day (QID) | INTRAMUSCULAR | Status: DC
  Administered 2019-05-29 – 2019-06-04 (×26): 10 mg via INTRAVENOUS
  Filled 2019-05-29 (×26): qty 2

## 2019-05-29 MED ORDER — DEXTROSE 50 % IV SOLN
50.0000 mL | Freq: Once | INTRAVENOUS | Status: AC
  Administered 2019-05-29: 50 mL via INTRAVENOUS

## 2019-05-29 MED ORDER — FENTANYL CITRATE (PF) 100 MCG/2ML IJ SOLN
12.5000 ug | INTRAMUSCULAR | Status: DC | PRN
  Administered 2019-05-29 – 2019-06-21 (×32): 12.5 ug via INTRAVENOUS
  Filled 2019-05-29 (×32): qty 2

## 2019-05-29 MED ORDER — VITAL 1.5 CAL PO LIQD
1000.0000 mL | ORAL | Status: DC
  Administered 2019-05-30 – 2019-05-31 (×2): 1000 mL
  Filled 2019-05-29 (×2): qty 1000

## 2019-05-29 NOTE — Progress Notes (Signed)
Patient ID: Brendan [name], male   DOB: 1956-01-10, 64 y.o.   MRN: [id number]    S:  Was off of CRRT for a few hours last evening-  Ended up 1400 positive on purpose-  CVP still low - still on a little dose of pressor- no UOP  O:BP (!) 93/40   Pulse 80   Temp (!) 97.5 F (36.4 C) (Axillary)   Resp (!) 23   Ht 5' 11"  (1.803 m) Comment: measured x 3  Wt 56.8 kg   SpO2 100%   BMI 17.46 kg/m   Intake/Output Summary (Last 24 hours) at 05/29/2019 0637 Last data filed at 05/29/2019 0500 Gross per 24 hour  Intake 3062.93 ml  Output 1468 ml  Net 1594.93 ml   Intake/Output: I/O last 3 completed shifts: In: 2549 [I.V.:1557.5; NG/GT:2890; IV Piggyback:805.6] Out: 3768 [Other:3728; Chest Tube:40]  Intake/Output this shift:  Total I/O In: 302.2 [I.V.:167; IV Piggyback:135.2] Out: 383 [Other:383] Weight change: 2 kg Gen: frail, cachectic but alert WM on vent via trach- left IJ vascath placed 5/3 CVS: RRR Resp: decreased BS at bases Abd: +BS, soft, NT/ND Ext: no edema- atrophied legs   Recent Labs  Lab 05/25/19 1605 05/25/19 1614 05/26/19 0445 05/26/19 0445 05/26/19 1638 05/26/19 1638 05/27/19 0530 05/27/19 0814 05/27/19 1550 05/28/19 0408 05/28/19 0417 05/28/19 1656 05/29/19 0501  NA 136   < > 138   < > 137   < > 137 137 136 136 136 137 136  K 5.0   < > 4.6   < > 5.0   < > 4.5 4.8 4.9 4.9 4.9 4.8 5.3*  CL 101  --  102  --  101  --  102  --  100  --  102 102  --   CO2 25  --  25  --  27  --  24  --  25  --  24 26  --   GLUCOSE 180*  --  78  --  165*  --  130*  --  197*  --  98 99  --   BUN 30*  --  28*  --  26*  --  28*  --  29*  --  25* 26*  --   CREATININE 1.20  --  1.03  --  1.05  --  1.15  --  1.07  --  0.90 1.04  --   ALBUMIN 3.3*  --  3.9  --  3.4*  --  3.2*  3.2*  --  3.0*  --  3.1* 3.4*  --   CALCIUM 8.9  --  9.1  --  8.8*  --  9.1  --  8.5*  --  8.7* 8.6*  --   PHOS 2.8  --  2.1*  --  2.0*  --  1.3*  --  3.7  --  2.5 3.0  --   AST  --   --   --   --   --   --   29  --   --   --   --   --   --   ALT  --   --   --   --   --   --  15  --   --   --   --   --   --    < > = values in this interval not displayed.   Liver Function Tests: Recent Labs  Lab 05/27/19 0530 05/27/19 0530  05/27/19 1550 05/28/19 0417 05/28/19 1656  AST 29  --   --   --   --   ALT 15  --   --   --   --   ALKPHOS 294*  --   --   --   --   BILITOT 0.9  --   --   --   --   PROT 7.1  --   --   --   --   ALBUMIN 3.2*  3.2*   < > 3.0* 3.1* 3.4*   < > = values in this interval not displayed.   No results for input(s): LIPASE, AMYLASE in the last 168 hours. No results for input(s): AMMONIA in the last 168 hours. CBC: Recent Labs  Lab 05/24/19 0539 05/24/19 1849 05/25/19 0400 05/25/19 0437 05/26/19 0445 05/26/19 0445 05/27/19 0530 05/27/19 0814 05/28/19 0408 05/28/19 0417 05/29/19 0501  WBC 9.8  --  8.5   < > 9.8  --  8.7  --   --  6.8  --   HGB 8.1*   < > 7.1*   < > 8.1*   < > 9.0*   < > 9.9* 8.9* 8.5*  HCT 26.1*   < > 23.6*   < > 26.2*   < > 29.5*   < > 29.0* 28.7* 25.0*  MCV 99.2  --  100.0  --  96.7  --  97.4  --   --  98.0  --   PLT 162  --  157   < > 193  --  205  --   --  170  --    < > = values in this interval not displayed.   Cardiac Enzymes: No results for input(s): CKTOTAL, CKMB, CKMBINDEX, TROPONINI in the last 168 hours. CBG: Recent Labs  Lab 05/28/19 1126 05/28/19 1652 05/28/19 2025 05/29/19 0035 05/29/19 0406  GLUCAP 139* 87 85 34* 98    Iron Studies: No results for input(s): IRON, TIBC, TRANSFERRIN, FERRITIN in the last 72 hours. Studies/Results: DG Chest Port 1 View  Result Date: 05/28/2019 CLINICAL DATA:  CABG. EXAM: PORTABLE CHEST 1 VIEW COMPARISON:  Fist x-ray from yesterday. FINDINGS: Unchanged tracheostomy and feeding tubes. Unchanged tunneled left internal jugular dialysis catheter and right upper extremity PICC line. Unchanged right chest tube. Stable cardiomediastinal silhouette status post CABG and mitral valve repair. Diffuse  interstitial and scattered patchy airspace opacities are similar to prior study. No pneumothorax or large pleural effusion. No acute osseous abnormality. IMPRESSION: 1. Unchanged bilateral interstitial and airspace disease. Electronically Signed   By: Titus Dubin M.D.   On: 05/28/2019 07:47   . aspirin EC  325 mg Oral Daily   Or  . aspirin  324 mg Per Tube Daily  . B-complex with vitamin C  1 tablet Per Tube Daily  . chlorhexidine gluconate (MEDLINE KIT)  15 mL Mouth Rinse BID  . Chlorhexidine Gluconate Cloth  6 each Topical Q0600  . citalopram  10 mg Per Tube Daily  . darbepoetin (ARANESP) injection - NON-DIALYSIS  100 mcg Subcutaneous Q Tue-1800  . docusate  200 mg Per Tube Daily  . feeding supplement (PRO-STAT SUGAR FREE 64)  30 mL Per Tube BID  . gabapentin  100 mg Oral Q8H  . insulin aspart  1 Units Subcutaneous Q4H  . insulin glargine  8 Units Subcutaneous BID  . mouth rinse  15 mL Mouth Rinse 10 times per day  . melatonin  3 mg Per Tube  QHS  . midodrine  20 mg Per Tube Q8H  . neomycin-polymyxin-hydrocortisone  3 drop Left EAR Q12H  . pantoprazole sodium  40 mg Per Tube BID  . polyethylene glycol  17 g Oral Daily  . QUEtiapine  25 mg Oral QHS  . rOPINIRole  0.5 mg Oral QHS  . sennosides  5 mL Per Tube BID  . sodium chloride flush  10-40 mL Intracatheter Q12H    BMET    Component Value Date/Time   NA 136 05/29/2019 0501   K 5.3 (H) 05/29/2019 0501   CL 102 05/28/2019 1656   CO2 26 05/28/2019 1656   GLUCOSE 99 05/28/2019 1656   BUN 26 (H) 05/28/2019 1656   CREATININE 1.04 05/28/2019 1656   CALCIUM 8.6 (L) 05/28/2019 1656   GFRNONAA >60 05/28/2019 1656   GFRAA >60 05/28/2019 1656   CBC    Component Value Date/Time   WBC 6.8 05/28/2019 0417   RBC 2.93 (L) 05/28/2019 0417   HGB 8.5 (L) 05/29/2019 0501   HCT 25.0 (L) 05/29/2019 0501   PLT 170 05/28/2019 0417   MCV 98.0 05/28/2019 0417   MCH 30.4 05/28/2019 0417   MCHC 31.0 05/28/2019 0417   RDW 17.0 (H)  05/28/2019 0417   LYMPHSABS 1.1 04/30/2019 0402   MONOABS 1.1 (H) 05/01/2019 0402   EOSABS 0.2 04/29/2019 0402   BASOSABS 0.0 04/24/2019 0402    Brief HPI: admitted to outside hospital on 3/26 with SOB, new low EF and NSTEMI txd to [facility] 3/29: 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 Admission Scr was 0.99.  Assessment/Plan:  1. Acute hypoxic respiratory failure on 05/23/19- on vent via trach.  S/p right chest tube    2. Oliguric/anuricAKI- CRRT started on 4/10 and stopped 4/29. Transitioned to IHD on 05/17/19 but failed and now back on CRRT 1. Remains anuric. 2. Resumed CRRT on 05/23/19 following acute worsening of his oxygenation and hypotension.    3. Dialysate 4K/2.5 Ca for replacement fluids and dialysate.  no heparin.  Running even to positive.  Will cont to run even and also will bolus outside of machine 4. Will keep on CRRT as long as no UOP and req pressors   3. Vascular access- leftIJ tunneled HD catheter placed by IR on 05/19/19- now 52 days old- will need to change line out soon- maybe try to get tunneled cath early next week ?  4. Anemia- pretty stable after one unit on 5/9- added darbe 100 -  Dropped a little more today  5. CAD- 3 vessel with EF 25% s/p CABG and MVR 06/24/35 complicated by cardiogenic shock 6. Acute systolic CHF and cardiogenic shock- still on pressors. Volume improved with CRRT/IHD.  On pretty max midodrine as well.  Am going to continue to run positive in attempt to get off pressors 7. [place]-  Phos OK -  K is OK- discovered he was on binder !   Has been stopped     8. MR s/p MVR  9. Severe debility- will likely need LTAC if survives. [place] meeting possibly in the works  [place] [phone]

## 2019-05-29 NOTE — Progress Notes (Signed)
Physical Therapy Treatment Patient Details Name: Brendan [name] MRN: [id number] DOB: [birth year] Today's Date: 05/29/2019    History of Present Illness Pt presented with SOB and fatigue at [facility] found to have new HFrEF,  NSTEMI, and bilateral pleural effusions transferred to [facility] on 3/29 for further cardiac evaluation.  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  On the evening of 4/1, patient developed worsening respiratory distress and hypoxia, head R/L heart cath on 3/30 & R heart cath on 4/1 pt intubated on 4/1, extubated 4/4, had R & L chest tubes placed 4/2 due to bilateral pleural effusions, removed 4/5 and impella device placed 4/2.  CABG and MVR on 4/6. Extubated 4/7. Removal of impella 4/9. CVVH started 4/10 and off 4/30. Respiratory distress and reintubated 4/12. Extubated 4/14. Reintubated 4/15. Trach 4/16.  CRRT off 4/29 then restarted 5/7. Trach collar 5/2 with return to vent 5/7. PMHx: Tobacco abuse, HTN, poorly controlled DM, diabetic neuropathy.    PT Comments    Pt awake, alert on vent with CRRT. Pt able to follow commands and mouth words in response to questions. Pt able to tolerate brief standing and HEP this session limited by drop to 86/32 (50) in sitting after standing. In bed with HOB elevated 102/36 (59).  HR 77 SpO2 100%     Follow Up Recommendations  LTACH     Equipment Recommendations  None recommended by PT    Recommendations for Other Services       Precautions / Restrictions Precautions Precautions: Fall;Sternal Precaution Comments: trach on vent, CRRT    Mobility  Bed Mobility Overal bed mobility: Needs Assistance Bed Mobility: Supine to Sit;Sit to Supine;Rolling Rolling: Mod assist   Supine to sit: Total assist     General bed mobility comments: Used foot egress to transition to sitting and back to bed. cues and assist to roll for pad placement  Transfers Overall transfer level: Needs assistance   Transfers: Sit to/from Stand Sit to Stand: Mod  assist         General transfer comment: mod assist to rise and stand at foot of bed grossly 20 sec limited by fatigue. Unable to perform additional trials due to orthostatic hypotension even after HEP in sitting and returned to supine with HOB elevated  Ambulation/Gait             General Gait Details: unable   Stairs             Wheelchair Mobility    Modified Rankin (Stroke Patients Only)       Balance Overall balance assessment: Needs assistance Sitting-balance support: Feet supported Sitting balance-Leahy Scale: Poor Sitting balance - Comments: rounded shoulders with assist to sit without trunk support, fatigues quickly   Standing balance support: Bilateral upper extremity supported Standing balance-Leahy Scale: Poor Standing balance comment: physical assist for knee blocking and pelvis support for standing with bil UE on therapist arms                            Cognition Arousal/Alertness: Awake/alert Behavior During Therapy: Flat affect Overall Cognitive Status: Difficult to assess                                 General Comments: pt able to state name and day.      Exercises General Exercises - Upper Extremity Shoulder Flexion: AROM;Both;10 reps;Seated General Exercises -  Lower Extremity Long Arc Quad: Both;Seated;20 reps;AROM Hip Flexion/Marching: AAROM;Both;Seated;10 reps    General Comments        Pertinent Vitals/Pain Pain Assessment: No/denies pain    Home Living                      Prior Function            PT Goals (current goals can now be found in the care plan section) Progress towards PT goals: Progressing toward goals    Frequency    Min 2X/week      PT Plan Current plan remains appropriate    Co-evaluation              AM-PAC PT "6 Clicks" Mobility   Outcome Measure  Help needed turning from your back to your side while in a flat bed without using bedrails?: A  Lot Help needed moving from lying on your back to sitting on the side of a flat bed without using bedrails?: A Lot Help needed moving to and from a bed to a chair (including a wheelchair)?: Total Help needed standing up from a chair using your arms (e.g., wheelchair or bedside chair)?: Total Help needed to walk in hospital room?: Total Help needed climbing 3-5 steps with a railing? : Total 6 Click Score: 8    End of Session   Activity Tolerance: Treatment limited secondary to medical complications (Comment) Patient left: in bed;with call bell/phone within reach;with nursing/sitter in room Nurse Communication: Mobility status PT Visit Diagnosis: Other abnormalities of gait and mobility (R26.89);Muscle weakness (generalized) (M62.81)     Time: 1224-8250 PT Time Calculation (min) (ACUTE ONLY): 26 min  Charges:  $Therapeutic Exercise: 8-22 mins $Therapeutic Activity: 8-22 mins                     Mordecai Tindol P, PT Acute Rehabilitation Services Pager: [phone] Office: [place] Monya Kozakiewicz 05/29/2019, 1:33 PM

## 2019-05-29 NOTE — Progress Notes (Signed)
Inpatient Diabetes Program Recommendations  AACE/ADA: New Consensus Statement on Inpatient Glycemic Control   Target Ranges:  Prepandial:   less than 140 mg/dL      Peak postprandial:   less than 180 mg/dL (1-2 hours)      Critically ill patients:  140 - 180 mg/dL   Results for SENDER, RUEB (MRN [id number]) as of 05/29/2019 11:21  Ref. Range 05/28/2019 07:32 05/28/2019 11:26 05/28/2019 16:52 05/28/2019 20:25 05/29/2019 00:35 05/29/2019 04:06 05/29/2019 07:56  Glucose-Capillary Latest Ref Range: 70 - 99 mg/dL 109 (H)     Lantus 8 units 139 (H)  Novolog 1 unit 87   85  Novolog 1 unit  Lantus 8 units 34 (LL) 98 97     Lantus 8 units   Review of Glycemic Control  Current orders for Inpatient glycemic control: Lantus 8 units BID, Novolog 1 unit Q4H for Tube Feeding coverage  Inpatient Diabetes Program Recommendations:    Insulin-Please decrease Lantus to 5 units BID, please discontinue Novolog 1 units Q4H and order Novolog 0-6 units Q4H (very sensitive scale).   Thanks, Barnie Alderman, RN, MSN, CDE Diabetes Coordinator Inpatient Diabetes Program [phone] (Team Pager from 8am to 5pm)

## 2019-05-29 NOTE — Progress Notes (Signed)
  Echocardiogram 2D Echocardiogram has been performed.  Brendan [name] 05/29/2019, 10:59 AM

## 2019-05-29 NOTE — Progress Notes (Signed)
Patient ID: Brendan [name], male   DOB: 1955-09-13, 64 y.o.   MRN: [id number]     Advanced Heart Failure Rounding Note  PCP-Cardiologist: No primary care provider on file.   Subjective:    Remains on vent and CRRT   CVP was low yesterday and received 1L of IVF. CVP 5-6 this am (checked personally with RN)  CRRT running even. NE dropped to 6 overnight but BP dropped and Co-ox down to 49% so turned back to 7.   Was on PS on vent yesterday. Rested on full vent support overnight.   Objective:   Weight Range: 56.8 kg Body mass index is 17.46 kg/m.   Vital Signs:   Temp:  [95.2 F (35.1 C)-97.9 F (36.6 C)] 97.5 F (36.4 C) (05/13 0300) Pulse Rate:  [68-88] 74 (05/13 0700) Resp:  [9-27] 11 (05/13 0700) BP: (93-111)/(40-46) 93/40 (05/12 1524) SpO2:  [95 %-100 %] 100 % (05/13 0700) Arterial Line BP: (58-123)/(28-49) 121/49 (05/13 0700) FiO2 (%):  [40 %] 40 % (05/13 0400) Weight:  [56.8 kg] 56.8 kg (05/13 0500) Last BM Date: 05/27/19  Weight change: Filed Weights   05/27/19 0500 05/28/19 0410 05/29/19 0500  Weight: 55.8 kg 54.8 kg 56.8 kg    Intake/Output:   Intake/Output Summary (Last 24 hours) at 05/29/2019 0805 Last data filed at 05/29/2019 0745 Gross per 24 hour  Intake 2807.99 ml  Output 1566 ml  Net 1241.99 ml      Physical Exam   General:  Thin cachetic. On vent through trach  HEENT: normal Neck: supple. no JVD. + trach Carotids 2+ bilat; no bruits. No lymphadenopathy or thryomegaly appreciated. Cor: PMI nondisplaced. Regular rate & rhythm. No rubs, gallops or murmurs. The tunneled cath Lungs: coarse + right CT with 20cc/hr drainage Abdomen: soft, nontender, nondistended. No hepatosplenomegaly. No bruits or masses. Good bowel sounds. Extremities: no cyanosis, clubbing, rash, edema Neuro: alert & orientedx3, cranial nerves grossly intact. moves all 4 extremities w/o difficulty. Affect pleasant   Telemetry   NSR 70s Personally reviewed   Labs    CBC  Recent Labs    05/27/19 0530 05/27/19 0814 05/28/19 0417 05/29/19 0501  WBC 8.7  --  6.8  --   HGB 9.0*   < > 8.9* 8.5*  HCT 29.5*   < > 28.7* 25.0*  MCV 97.4  --  98.0  --   PLT 205  --  170  --    < > = values in this interval not displayed.   Basic Metabolic Panel Recent Labs    05/27/19 0530 05/27/19 0814 05/28/19 0417 05/28/19 0417 05/28/19 1656 05/29/19 0501  NA 137   < > 136   < > 137 136  K 4.5   < > 4.9   < > 4.8 5.3*  CL 102   < > 102  --  102  --   CO2 24   < > 24  --  26  --   GLUCOSE 130*   < > 98  --  99  --   BUN 28*   < > 25*  --  26*  --   CREATININE 1.15   < > 0.90  --  1.04  --   CALCIUM 9.1   < > 8.7*  --  8.6*  --   MG 2.9*  --  2.8*  --   --   --   PHOS 1.3*   < > 2.5  --  3.0  --    < > =  values in this interval not displayed.   Liver Function Tests Recent Labs    05/27/19 0530 05/27/19 1550 05/28/19 0417 05/28/19 1656  AST 29  --   --   --   ALT 15  --   --   --   ALKPHOS 294*  --   --   --   BILITOT 0.9  --   --   --   PROT 7.1  --   --   --   ALBUMIN 3.2*  3.2*   < > 3.1* 3.4*   < > = values in this interval not displayed.   No results for input(s): LIPASE, AMYLASE in the last 72 hours. Cardiac Enzymes No results for input(s): CKTOTAL, CKMB, CKMBINDEX, TROPONINI in the last 72 hours.  BNP: BNP (last 3 results) Recent Labs    03/29/2019 0113  BNP 655.7*    ProBNP (last 3 results) No results for input(s): PROBNP in the last 8760 hours.   D-Dimer No results for input(s): DDIMER in the last 72 hours. Hemoglobin A1C No results for input(s): HGBA1C in the last 72 hours. Fasting Lipid Panel No results for input(s): CHOL, HDL, LDLCALC, TRIG, CHOLHDL, LDLDIRECT in the last 72 hours. Thyroid Function Tests No results for input(s): TSH, T4TOTAL, T3FREE, THYROIDAB in the last 72 hours.  Invalid input(s): FREET3  Other results:   Imaging    No results found.   Medications:     Scheduled Medications: . aspirin EC   325 mg Oral Daily   Or  . aspirin  324 mg Per Tube Daily  . B-complex with vitamin C  1 tablet Per Tube Daily  . chlorhexidine gluconate (MEDLINE KIT)  15 mL Mouth Rinse BID  . Chlorhexidine Gluconate Cloth  6 each Topical Q0600  . citalopram  10 mg Per Tube Daily  . darbepoetin (ARANESP) injection - NON-DIALYSIS  100 mcg Subcutaneous Q Tue-1800  . docusate  200 mg Per Tube Daily  . feeding supplement (PRO-STAT SUGAR FREE 64)  30 mL Per Tube BID  . gabapentin  100 mg Oral Q8H  . insulin aspart  1 Units Subcutaneous Q4H  . insulin glargine  8 Units Subcutaneous BID  . mouth rinse  15 mL Mouth Rinse 10 times per day  . melatonin  3 mg Per Tube QHS  . methylnaltrexone  12 mg Subcutaneous Once  . metoCLOPramide (REGLAN) injection  10 mg Intravenous Q6H  . midodrine  20 mg Per Tube Q8H  . neomycin-polymyxin-hydrocortisone  3 drop Left EAR Q12H  . pantoprazole sodium  40 mg Per Tube BID  . polyethylene glycol  17 g Oral Daily  . QUEtiapine  25 mg Oral QHS  . rOPINIRole  0.5 mg Oral QHS  . sennosides  5 mL Per Tube BID  . sodium chloride flush  10-40 mL Intracatheter Q12H    Infusions: .  prismasol BGK 4/2.5 500 mL/hr at 05/29/19 0805  .  prismasol BGK 4/2.5 300 mL/hr at 05/28/19 2341  . sodium chloride 10 mL/hr at 05/29/19 0700  . sodium chloride    . sodium chloride    . ceFEPime (MAXIPIME) IV Stopped (05/28/19 2203)  . feeding supplement (VITAL 1.5 CAL) 1,000 mL (05/28/19 1500)  . norepinephrine (LEVOPHED) Adult infusion 8 mcg/min (05/29/19 0749)  . prismasol BGK 4/2.5 1,500 mL/hr at 05/29/19 0501    PRN Medications: Place/Maintain arterial line **AND** sodium chloride, sodium chloride, alum & mag hydroxide-simeth, dextrose, diphenhydrAMINE, fentaNYL (SUBLIMAZE) injection, Gerhardt's butt cream, heparin,  heparin, levalbuterol, ondansetron (ZOFRAN) IV, oxyCODONE, pneumococcal 23 valent vaccine, sodium chloride flush, sorbitol     Assessment/Plan   1. Acute systolic HF ->   Cardiogenic Shock  - Due to iCM. EF 20-25% - Impella 5.5 placed on 4/2.   - s/p CABG/MVRepair on 4/6  - Impella out 4/9.  - Echo 4/10 with EF 25-30%, normal RV, no MR.  - Echo 4/22 EF 30-35% mildly reduced RV MVR ok   - NE turned down overnight but now back up due to low BP and falling co-ox - CO-OX now 49% on Norepi 7 mcg + midodrine 20 mg tid. Will try to wean NE slowly but when we did this over the w/e co-ox dropped into 20-30% range. 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 Echo stable - No s/s ischemia - On ASA/Crestor  3. Acute hypoxic respiratory failure - Due to pulmonary edema and PNA - Extubated 4/9 re-intubated on 4/12. Failed re-extubation on 4/14. Now s/p tracheostom - S/P Bronch 4/27 - 60K yeast. -> ? Colonization.  - CT scan 5/7 with diffuse bilateral infiltrates c/w ARDS and large bilateral effusions R>L.  - R chest tube placed with > 1L out. Continues to drain ~ 20/hr - Remains on vent FiO2 40% - CCM following. Now weaning to PS during day and full vent at night. No change  4. Mitral Regurgitation - Mod-severe on ECHO - s/p MV repair, TEE 4/9 with minimal MR s/p repair.   - Echo 4/10 with no significant MR.  - echo 4/22 MVR stable - No change  5. Uncontrolled DM - Hgb A1C 9.  - On insulin and sliding scale.  - No change  6. AKI - due to shock - Remains anuric post-op - Continue CVVHD per Nephrology. Volume status low  7. Acute blood loss Anemia  - Transfused 1UPRBCs 05/25/19 - hgb 8.7 today - No obvious bleeding.   8. Polymorphic VT - Recurrent VT during respiratory distress 4/14, - Off amio  - No further VT.  - Keep K> 4.0 Mg > 2.0  - Rhythm stable.   9. Severe Malnutrition - Prealbumin improved, up from 8.9>>18.7 - Nutrition on board - Continue w/ TFs  10. Atrial fibrillation. paroxysmal - Remains in NSR.  - Amiodarone was stopped.   34. Debility,  severe - as above will likely need LTAC on d/c  12. Chronic pain issues - pain meds being tapered   Remains very tenuous. Palliative care has seen. Remains full go. 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  Critical care was necessary to treat or prevent imminent or life-threatening deterioration.  Critical care was time spent personally by me (independent of midlevel providers or residents) on the following activities: development of treatment plan with patient and/or surrogate as well as nursing, discussions with consultants, evaluation of patient's response to treatment, examination of patient, obtaining history from patient or surrogate, ordering and performing treatments and interventions, ordering and review of laboratory studies, ordering and review of radiographic studies, pulse oximetry and re-evaluation of patient's condition.     Glori Bickers, MD  8:05 AM

## 2019-05-29 NOTE — Progress Notes (Signed)
TCTS DAILY ICU PROGRESS NOTE                   [address]            ,[place] [address]          [phone]   27 Days Post-Op Procedure(s) (LRB): VIDEO BRONCHOSCOPY USING DISPOSABLE ANESTHESIA SCOPE (N/A) TRACHEOSTOMY (N/A)  Total Length of Stay:  LOS: 45 days   Subjective: Patient on trach collar and sleeping. Wife and daughter at bedside.  Objective: Vital signs in last 24 hours: Temp:  [95.2 F (35.1 C)-97.9 F (36.6 C)] 97.5 F (36.4 C) (05/13 0300) Pulse Rate:  [68-88] 74 (05/13 0700) Cardiac Rhythm: Normal sinus rhythm (05/13 0400) Resp:  [9-27] 11 (05/13 0700) BP: (93-111)/(40-46) 93/40 (05/12 1524) SpO2:  [95 %-100 %] 100 % (05/13 0700) Arterial Line BP: (58-123)/(28-49) 121/49 (05/13 0700) FiO2 (%):  [40 %] 40 % (05/13 0400) Weight:  [56.8 kg] 56.8 kg (05/13 0500)  Filed Weights   05/27/19 0500 05/28/19 0410 05/29/19 0500  Weight: 55.8 kg 54.8 kg 56.8 kg    Weight change: 2 kg   CVP:  [1 mmHg-2 mmHg] 2 mmHg  Intake/Output from previous day: 05/12 0701 - 05/13 0700 In: 2944.6 [I.V.:1431.7; NG/GT:1110; IV Piggyback:402.9] Out: 7371 [Chest Tube:20]  Intake/Output this shift: No intake/output data recorded.  Current Meds: Scheduled Meds: . aspirin EC  325 mg Oral Daily   Or  . aspirin  324 mg Per Tube Daily  . B-complex with vitamin C  1 tablet Per Tube Daily  . chlorhexidine gluconate (MEDLINE KIT)  15 mL Mouth Rinse BID  . Chlorhexidine Gluconate Cloth  6 each Topical Q0600  . citalopram  10 mg Per Tube Daily  . darbepoetin (ARANESP) injection - NON-DIALYSIS  100 mcg Subcutaneous Q Tue-1800  . docusate  200 mg Per Tube Daily  . feeding supplement (PRO-STAT SUGAR FREE 64)  30 mL Per Tube BID  . gabapentin  100 mg Oral Q8H  . insulin aspart  1 Units Subcutaneous Q4H  . insulin glargine  8 Units Subcutaneous BID  . mouth rinse  15 mL Mouth Rinse 10 times per day  . melatonin  3 mg Per Tube QHS  . methylnaltrexone  12 mg  Subcutaneous Once  . metoCLOPramide (REGLAN) injection  10 mg Intravenous Q6H  . midodrine  20 mg Per Tube Q8H  . neomycin-polymyxin-hydrocortisone  3 drop Left EAR Q12H  . pantoprazole sodium  40 mg Per Tube BID  . polyethylene glycol  17 g Oral Daily  . QUEtiapine  25 mg Oral QHS  . rOPINIRole  0.5 mg Oral QHS  . sennosides  5 mL Per Tube BID  . sodium chloride flush  10-40 mL Intracatheter Q12H   Continuous Infusions: .  prismasol BGK 4/2.5 500 mL/hr at 05/29/19 0501  .  prismasol BGK 4/2.5 300 mL/hr at 05/28/19 2341  . sodium chloride 10 mL/hr at 05/29/19 0700  . sodium chloride    . sodium chloride    . ceFEPime (MAXIPIME) IV Stopped (05/28/19 2203)  . feeding supplement (VITAL 1.5 CAL) 1,000 mL (05/28/19 1500)  . norepinephrine (LEVOPHED) Adult infusion 8 mcg/min (05/29/19 0700)  . prismasol BGK 4/2.5 1,500 mL/hr at 05/29/19 0501   PRN Meds:.Place/Maintain arterial line **AND** sodium chloride, sodium chloride, alum & mag hydroxide-simeth, dextrose, diphenhydrAMINE, fentaNYL (SUBLIMAZE) injection, Gerhardt's butt cream, heparin, heparin, levalbuterol, ondansetron (ZOFRAN) IV, oxyCODONE, pneumococcal 23 valent vaccine, sodium chloride flush, sorbitol  Heart: RRR Lungs: Rhonchi Abdomen: Soft, bowel sounds present Extremities: No LE edema Wound: Clean and dry.  Right chest tube: Yellow pleural fluid draining  Lab Results: CBC: Recent Labs    05/27/19 0530 05/27/19 0814 05/28/19 0417 05/29/19 0501  WBC 8.7  --  6.8  --   HGB 9.0*   < > 8.9* 8.5*  HCT 29.5*   < > 28.7* 25.0*  PLT 205  --  170  --    < > = values in this interval not displayed.   BMET:  Recent Labs    05/28/19 0417 05/28/19 0417 05/28/19 1656 05/29/19 0501  NA 136   < > 137 136  K 4.9   < > 4.8 5.3*  CL 102  --  102  --   CO2 24  --  26  --   GLUCOSE 98  --  99  --   BUN 25*  --  26*  --   CREATININE 0.90  --  1.04  --   CALCIUM 8.7*  --  8.6*  --    < > = values in this interval not  displayed.    CMET: Lab Results  Component Value Date   WBC 6.8 05/28/2019   HGB 8.5 (L) 05/29/2019   HCT 25.0 (L) 05/29/2019   PLT 170 05/28/2019   GLUCOSE 99 05/28/2019   CHOL 174 08/21/2017   TRIG 143 08/21/2017   HDL 57 08/21/2017   LDLCALC 88 08/21/2017   ALT 15 05/27/2019   AST 29 05/27/2019   NA 136 05/29/2019   K 5.3 (H) 05/29/2019   CL 102 05/28/2019   CREATININE 1.04 05/28/2019   BUN 26 (H) 05/28/2019   CO2 26 05/28/2019   TSH 0.925 03/31/2019   INR 1.6 (H) 05/01/2019   HGBA1C 9.0 (H) 03/28/2019   MICROALBUR 30 08/21/2017    PT/INR:  No results for input(s): LABPROT, INR in the last 72 hours. Radiology: No results found.  Assessment/Plan: S/P Procedure(s) (LRB): VIDEO BRONCHOSCOPY USING DISPOSABLE ANESTHESIA SCOPE (N/A) TRACHEOSTOMY (N/A)  1. CV-S/p removal of Impella on 04/09.  S/p V tach arrest 04/15. Previous a fib. SR with HR in the 80's this am.  On  Midodrine 20 mg tid and Nor epinephrine 8 mcg/min.Marland Kitchen Receiving intermittent CVVHD and remains pressor dependent.   2. Pulmonary-S/p trach 04/16.  Right chest tube with 70 last 12 hours. CXR this am appears stable.  Hope to remove chest tube soon if output continues to decrease. On trach collar at time of exam. 3. Expected post op blood loss anemia-H and H yesterday slightly increased to 7.6 and 25. Continue Aranesp 4. DM-CBGs 149/129/86. On Insulin. He is on Insulin and this has been decreased to avoid hypoglycemia. He was on Metformin 1000 mg bid prior to surgery, but will continue on Insulin for now as NPO. Pre op HGA1C 9. He will need close medical follow up after discharge 5. Oliguric/anuric AKI-Creatinine 0.99 this am. Nephrology following and arranging for CRRT accordingly. Per nephrology, continue CRRT as long as no UOP and needs pressors 6. GI-severe malnutrition of chronic illness.  Cortrak, TFs. Speech pathology evaluation  Yesterday and recommended PEG. Albumin 3.1 7. Acute systolic heart failure-CVVHD  helping with volume status 8. Previous elevated transaminases, AST 29, ALT 15 and alk phos down to 294. 9. Extremely deconditioned-will need PT as able.  10. ID-on Maxipime  [name] PA-C 05/29/2019 7:45 AM   Patient on trach collar today, breathing comfortably Episode  of emesis - prob from diabetic gastroparesis and narcotic induced constipation. Both being treated  Norepi reduced to 4 mcg Will place back on vent to rest this pm  patient examined and medical record reviewed,agree with above note. [name] 05/29/2019

## 2019-05-29 NOTE — Progress Notes (Signed)
   Daily Progress Note   Patient Name: Brendan [name]       Date: 05/29/2019 DOB: [birth year]  Age: 64 y.o. MRN#: [id number] Attending Physician: Ivin Poot, MD Primary Care Physician: [name], MD Admit Date: 03/30/2019  Reason for Consultation/Follow-up: Establishing goals of care  Subjective: Patient awake, alert on trach collar. Continues to rest during the night on full vent support.  Medical team continues to attempt to wean levo. Per RN was able to titrate down some yesterday however required increase due to hypotension and decreased co-ox. Will continue to slowly wean today. Patient denied pain. No family at the bedside.   Spoke with wife via phone and provided updates and support. She verbalized understanding and appreciation. She and daughter continue to express wishes for full code/full scope care. They remain hopeful for some improvement and stability. Aware of challenges and current plan of care. Daughter is planning to return home tomorrow for about a week and return for continued support to her mom and dad.   Questions answered and support given.   Length of Stay: 45 days  Vital Signs: BP (!) 107/37   Pulse 76   Temp (!) 97.5 F (36.4 C) (Axillary)   Resp 13   Ht 5\' 11"  (1.803 m) Comment: measured x 3  Wt 56.8 kg   SpO2 100%   BMI 17.46 kg/m  SpO2: SpO2: 100 % O2 Device: O2 Device: Tracheostomy Collar O2 Flow Rate: O2 Flow Rate (L/min): 10 L/min  Intake/output summary:   Intake/Output Summary (Last 24 hours) at 05/29/2019 1024 Last data filed at 05/29/2019 1000 Gross per 24 hour  Intake 3203.12 ml  Output 1219 ml  Net 1984.12 ml   LBM:   Baseline Weight: Weight: 68.2 kg Most recent weight: Weight: 56.8 kg  Physical Exam: -awake, alert, ill-appearing, cachectic - RRR -tolerating trach collar, congested cough -follows commands, mood appropriate             Palliative Care Assessment & Plan    Code Status:  Full code Goals of  Care:  Wife and daughter remains hopeful for stability/improvement with awareness of current condition/challenges and guarded prognosis.   PMT will continue to support. I will be off service after today. My colleagues will provide ongoing support as needed over the weekend. Please call PMT line for urgent needs.   Prognosis:Guarded to Poor   Thank you for allowing the Palliative Medicine Team to assist in the care of this patient.  Time Total: 35 min.   Visit consisted of counseling and education dealing with the complex and emotionally intense issues of symptom management and palliative care in the setting of serious and potentially life-threatening illness.Greater than 50%  of this time was spent counseling and coordinating care related to the above assessment and plan.  Alda Lea, AGPCNP-BC  Palliative Medicine Team [phone]

## 2019-05-29 NOTE — Progress Notes (Signed)
Pt placed on 40% TC per Dr. Prescott Gum request. Pt is tolerating well. RN at bedside. RT to monitor.

## 2019-05-29 NOTE — Progress Notes (Signed)
Pharmacy Antibiotic Note  Brendan [name] is a 64 y.o. male admitted on 03/26/2019 with severe 3 vessel disease s/p CABG on 4/6 who was re-intubated 4/12 and trached 4/14 has completed a course of antibiotics this stay and now with concern for a new pneumonia.  Pharmacy has been consulted for Vancomycin dosing. Cefepime was started 5/6. Vancomycin stopped by CCM 5/10.   WBC is within normal limits and patient is afebrile. Patient is requiring levophed at 10 mcg/min + Midodrine.  Patient has been off CRRT since 4/29 and on HD. Last HD was on 5/5, then back to CRRT. Cefepime ends tomorrow 5/14.  Plan: Continue Cefepime 2g q 12hr Monitor renal plans, culture results, and clinical status.   Height: _0  (180.3 cm)(measured x 3) Weight: 56.8 kg (125 lb 3.5 oz) IBW/kg (Calculated) : 75.3  Temp (24hrs), Avg:97.1 F (36.2 C), Min:95.2 F (35.1 C), Max:97.9 F (36.6 C)  Recent Labs  Lab 05/24/19 0539 05/24/19 1957 05/25/19 0400 05/25/19 1605 05/26/19 0445 05/26/19 0445 05/26/19 1638 05/27/19 0530 05/27/19 1550 05/28/19 0417 05/28/19 1656  WBC 9.8  --  8.5  --  9.8  --   --  8.7  --  6.8  --   CREATININE 2.30*   < > 1.36*   < > 1.03   < > 1.05 1.15 1.07 0.90 1.04   < > = values in this interval not displayed.    Estimated Creatinine Clearance: 59.9 mL/min (by C-G formula based on SCr of 1.04 mg/dL).    Allergies  Allergen Reactions  . Acetaminophen Itching    Antimicrobials this admission: Cefepime 4/4>4/13 Vanc 4/4>4/4; resume 4/12 > 4/15; 5/7 >> Rocephin 4/4 > 4/6 Ampicillin 4/12>4/12 Unasyn 4/16 >>4/20 Zosyn 4/20>>4/26 Linezolid 4/20>>4/23 Fluconazole 4/28>>5/4  Dose adjustments this admission:   Microbiology results: 4/2 R pleural fluid > neg 4/4 BCx x 2 > neg 4/9 BCx - negative 4/9 Wound Cx > rare enterococcus faecalis (pan-S) 4/9 UCx >  > 100K yeast 4/19 BAL - rare E.faecalis (pan sensitive) 4/12 TA > ngF 4/16 BAL > ngF 4/20 TA > rare candida - not  treating per ID 4/20 BCx > ngF 4/21 TA > rare candida - not treating per ID 4/27 BAL > 60k yeast 4/29 Trach asp > nf 5/6 Resp Cx > few GNR- enterobacter cloacae - R ancef, S cefepime 5/7 pleural - ngtd 5/7 bldx2 - ngtd   Thank you for allowing pharmacy to be a part of this patient's care.   Arrie Senate, PharmD, BCPS Clinical Pharmacist [phone] Please check [employer]ION for all [employer] numbers 05/29/2019

## 2019-05-29 NOTE — Progress Notes (Signed)
CT surgery p.m. Rounds  Patient had episode of emesis now resolved Maintained trach collar all day We will place back on ventilator rest mode this p.m. Resume tube feeds in a.m.

## 2019-05-29 NOTE — Progress Notes (Signed)
CPT held due to pt working with physical therapy.

## 2019-05-29 NOTE — Progress Notes (Signed)
Hypoglycemic Event  CBG: 48  Treatment: 25g D50 per hypoglycemia standing orders Symptoms: None  Follow-up CBG: Time: 1315 CBG Result: 140  Possible Reasons for Event: Tube feed rate decreased this AM from 60 to 20 mL/hr d/t emesis overnight    [employer]

## 2019-05-29 NOTE — Progress Notes (Signed)
  Speech Language Pathology Treatment: Dysphagia  Patient Details Name: Brendan [name] MRN: [id number] DOB: 1955-11-19 Today's Date: 05/29/2019 Time: 9798-9211 SLP Time Calculation (min) (ACUTE ONLY): 14 min  Assessment / Plan / Recommendation Clinical Impression  Pt was seen for treatment and appeared more motivated than during the last session with this SLP. Pt has on been on trach collar today and RN indicated that the pt has been tolerating it with normal vitals. Pt's cuff was inflated upon SLP's entry and with vitals RR 14, SpO2 98 and HR 78. Vitals were RR 10 SpO2 98 HR 78 immediately following cuff deflation. Pt then demonstrated a rapid and progressive decline in RR without any overt signs of distress. Pt's cuff was immediately re-inflated and RR improved to 14-20 for the duration of the session. PMSV placement was deferred. Pt demonstrated lingual retraction exercises with verbal prompts for maximum lingual retraction. However, he reported that exercises were causing him to feel more nauseated and therefore requested that additional exercises be deferred. SLP will continue to follow pt.    HPI HPI: 37 yoM originally presented with SOB and fatigue at [facility] found to have new HFrEF, NSTEMI, and bilateral pleural effusions transferred to [facility] on 3/29 for further cardiac evaluation. 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 Started on lasix and milrinone gtts however developed NSVT. Was taken 4/1 for placement of IABP and swan for optimization prior to CABG. Was on precedex for agitation/ confusion, some concern for DTs. On the evening of 4/1, patient developed worsening respiratory distress and hypoxia, PCCM consulted for intubation and vent management. He was successfully extubated on 4/4 and PCCM signed off. Impella was removed 4/11 and patient noted to have increased WOB throughout the day he was placed on Bipap, but mental status continued to worsen requiring reintubation. Found pulseless 4/14 VT  requiring 1 minute of CPR and epinephrine to achieve ROSC.  Eventual trach placement 4/16.       SLP Plan  Continue with current plan of care       Recommendations  Diet recommendations: NPO Medication Administration: Via alternative means      Patient may use Passy-Muir Speech Valve: with SLP only PMSV Supervision: Full         Oral Care Recommendations: Oral care QID Follow up Recommendations: LTACH SLP Visit Diagnosis: Dysphagia, oropharyngeal phase (R13.12) Plan: Continue with current plan of care       Destine Zirkle I. Hardin Negus, [place], [place] Office number [phone] Pager [place] 05/29/2019, 5:25 PM

## 2019-05-30 ENCOUNTER — Inpatient Hospital Stay (HOSPITAL_COMMUNITY): Payer: Medicaid Other

## 2019-05-30 ENCOUNTER — Encounter: Payer: Self-pay | Admitting: Family Medicine

## 2019-05-30 LAB — CBC
HCT: 22.1 % — ABNORMAL LOW (ref 39.0–52.0)
HCT: 24.7 % — ABNORMAL LOW (ref 39.0–52.0)
Hemoglobin: 6.9 g/dL — CL (ref 13.0–17.0)
Hemoglobin: 7.7 g/dL — ABNORMAL LOW (ref 13.0–17.0)
MCH: 31.1 pg (ref 26.0–34.0)
MCH: 31 pg (ref 26.0–34.0)
MCHC: 31.2 g/dL (ref 30.0–36.0)
MCHC: 31.2 g/dL (ref 30.0–36.0)
MCV: 99.5 fL (ref 80.0–100.0)
MCV: 99.6 fL (ref 80.0–100.0)
Platelets: UNDETERMINED 10*3/uL (ref 150–400)
Platelets: UNDETERMINED 10*3/uL (ref 150–400)
RBC: 2.22 MIL/uL — ABNORMAL LOW (ref 4.22–5.81)
RBC: 2.48 MIL/uL — ABNORMAL LOW (ref 4.22–5.81)
RDW: 16.6 % — ABNORMAL HIGH (ref 11.5–15.5)
RDW: 17.3 % — ABNORMAL HIGH (ref 11.5–15.5)
WBC: 8.1 10*3/uL (ref 4.0–10.5)
WBC: 6.3 10*3/uL (ref 4.0–10.5)
nRBC: 0 % (ref 0.0–0.2)
nRBC: 0 % (ref 0.0–0.2)

## 2019-05-30 LAB — POCT I-STAT 7, (LYTES, BLD GAS, ICA,H+H)
Acid-Base Excess: 3 mmol/L — ABNORMAL HIGH (ref 0.0–2.0)
Bicarbonate: 27.6 mmol/L (ref 20.0–28.0)
Calcium, Ion: 1.22 mmol/L (ref 1.15–1.40)
HCT: 20 % — ABNORMAL LOW (ref 39.0–52.0)
Hemoglobin: 6.8 g/dL — CL (ref 13.0–17.0)
O2 Saturation: 99 %
Potassium: 4.6 mmol/L (ref 3.5–5.1)
Sodium: 137 mmol/L (ref 135–145)
TCO2: 29 mmol/L (ref 22–32)
pCO2 arterial: 41.4 mmHg (ref 32.0–48.0)
pH, Arterial: 7.432 (ref 7.350–7.450)
pO2, Arterial: 123 mmHg — ABNORMAL HIGH (ref 83.0–108.0)

## 2019-05-30 LAB — RENAL FUNCTION PANEL
Albumin: 3.1 g/dL — ABNORMAL LOW (ref 3.5–5.0)
Albumin: 3.1 g/dL — ABNORMAL LOW (ref 3.5–5.0)
Anion gap: 8 (ref 5–15)
Anion gap: 9 (ref 5–15)
BUN: 12 mg/dL (ref 8–23)
BUN: 12 mg/dL (ref 8–23)
CO2: 24 mmol/L (ref 22–32)
CO2: 25 mmol/L (ref 22–32)
Calcium: 8.3 mg/dL — ABNORMAL LOW (ref 8.9–10.3)
Calcium: 8 mg/dL — ABNORMAL LOW (ref 8.9–10.3)
Chloride: 105 mmol/L (ref 98–111)
Chloride: 103 mmol/L (ref 98–111)
Creatinine, Ser: 0.77 mg/dL (ref 0.61–1.24)
Creatinine, Ser: 0.75 mg/dL (ref 0.61–1.24)
GFR calc Af Amer: >60 mL/min (ref >60)
GFR calc Af Amer: >60 mL/min (ref >60)
GFR calc non Af Amer: >60 mL/min (ref >60)
GFR calc non Af Amer: >60 mL/min (ref >60)
Glucose, Bld: 49 mg/dL — ABNORMAL LOW (ref 70–99)
Glucose, Bld: 160 mg/dL — ABNORMAL HIGH (ref 70–99)
Phosphorus: 1.5 mg/dL — ABNORMAL LOW (ref 2.5–4.6)
Phosphorus: 6.5 mg/dL — ABNORMAL HIGH (ref 2.5–4.6)
Potassium: 4.6 mmol/L (ref 3.5–5.1)
Potassium: 4.2 mmol/L (ref 3.5–5.1)
Sodium: 137 mmol/L (ref 135–145)
Sodium: 137 mmol/L (ref 135–145)

## 2019-05-30 LAB — GLUCOSE, CAPILLARY
Glucose-Capillary: 109 mg/dL — ABNORMAL HIGH (ref 70–99)
Glucose-Capillary: 132 mg/dL — ABNORMAL HIGH (ref 70–99)
Glucose-Capillary: 43 mg/dL — CL (ref 70–99)
Glucose-Capillary: 47 mg/dL — ABNORMAL LOW (ref 70–99)
Glucose-Capillary: 64 mg/dL — ABNORMAL LOW (ref 70–99)
Glucose-Capillary: 70 mg/dL (ref 70–99)
Glucose-Capillary: 93 mg/dL (ref 70–99)

## 2019-05-30 LAB — PREPARE RBC (CROSSMATCH)

## 2019-05-30 LAB — MAGNESIUM: Magnesium: 2.2 mg/dL (ref 1.7–2.4)

## 2019-05-30 LAB — COOXEMETRY PANEL
Carboxyhemoglobin: 1.4 % (ref 0.5–1.5)
Methemoglobin: 1.2 % (ref 0.0–1.5)
O2 Saturation: 39 %
Total hemoglobin: 8.5 g/dL — ABNORMAL LOW (ref 12.0–16.0)

## 2019-05-30 MED ORDER — SORBITOL 70 % SOLN
960.0000 mL | TOPICAL_OIL | Freq: Once | ORAL | Status: AC
  Administered 2019-05-30: 960 mL via RECTAL
  Filled 2019-05-30: qty 473

## 2019-05-30 MED ORDER — NEOSTIGMINE METHYLSULFATE 10 MG/10ML IV SOLN
0.2500 mg | Freq: Four times a day (QID) | INTRAVENOUS | Status: DC
  Administered 2019-05-30 – 2019-06-04 (×21): 0.25 mg via SUBCUTANEOUS
  Filled 2019-05-30 (×27): qty 0.25

## 2019-05-30 MED ORDER — EPINEPHRINE 1 MG/10ML IJ SOSY
PREFILLED_SYRINGE | INTRAMUSCULAR | Status: AC
  Filled 2019-05-30: qty 10

## 2019-05-30 MED ORDER — ATROPINE SULFATE 1 MG/10ML IJ SOSY
PREFILLED_SYRINGE | INTRAMUSCULAR | Status: AC
  Filled 2019-05-30: qty 10

## 2019-05-30 MED ORDER — SODIUM PHOSPHATES 45 MMOLE/15ML IV SOLN
30.0000 mmol | Freq: Once | INTRAVENOUS | Status: AC
  Administered 2019-05-30: 30 mmol via INTRAVENOUS
  Filled 2019-05-30: qty 10

## 2019-05-30 MED ORDER — SODIUM CHLORIDE 0.9% IV SOLUTION: Freq: Once | INTRAVENOUS | Status: AC

## 2019-05-30 MED ORDER — DOBUTAMINE IN D5W 4-5 MG/ML-% IV SOLN
2.5000 ug/kg/min | INTRAVENOUS | Status: DC
  Administered 2019-05-30 – 2019-06-04 (×3): 2.5 ug/kg/min via INTRAVENOUS
  Filled 2019-05-30 (×2): qty 250

## 2019-05-30 NOTE — Progress Notes (Signed)
NAME:  Brendan [name], MRN:  [id number], DOB:  [birth year], LOS: 29 ADMISSION DATE:  04/13/2019, CONSULTATION DATE:  04/17/19 REFERRING MD:  Dr. Haroldine Laws, CHIEF COMPLAINT:  Respiratory distress   Brief History   54 yoM originally presented with SOB and fatigue at [facility] found to have new HFrEF, NSTEMI, and bilateral pleural effusions transferred to [facility] on 3/29 for further cardiac evaluation. 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 Started on lasix and milrinone gtts however developed NSVT. Was taken 4/1 for placement of IABP and swan for optimization prior to CABG. Was on precedex for agitation/ confusion, some concern for DTs. On the evening of 4/1, patient developed worsening respiratory distress and hypoxia, PCCM consulted for intubation and vent management.  Patient was extubated on 4/14 and developed worsening hypoxia on BiPAP. Overnight he developed pulseless VT requiring 1 minute of CPR and epinephrine to achieve ROSC. Neurovascularly intact afterward. Subsequently had increased work of breathing and worsening respiratory distress requiring intubation. Patient had tracheostomy performed on 04/29/2019. Trying to wean off ventilator.   Past Medical History  Tobacco abuse, HTN, poorly controlled diabetes, diabetic neuropathy  Significant Hospital Events   3/30 Lt heart cath/TEE performed 4/1 Intubated, RHC/IABP/PA cath 4/2 Impella placement 4/3 febrile over evening and night. Blood cultures obtained. Vanc/cefepime started. Hematuria--heparin held. 4/4 Extubated,abx transitioned to rocephin. Heparin restarted 4/6 Re-intubated for CABG/MVR 4/7 Extubated from CABG to BiPAP 4/9 Impella removed  4/9 TEE 4/10 CVC inserted &CRRT initiated  4/12 PCCMre-consulted re-intubation 4/14 Extubated to BiPAP 4/15 Re-intubated due to respiratory distress 4/16 Tracheostomy 4/27 bronch with BAL 5/7 called back for hypercarbic failure. #6 cuffed placed. Right chest tube placed for enlarging effusion.   Consults:  TCTS PCCM HF Nephrology  Procedures:  R cephalic double lumen PICC LIJ tunneled HD catheter L brachial arterial line 6-0 cuffed shiley  Significant Diagnostic Tests:  3/30 R/ LHC >>  Ost LM to Mid LM lesion is 35% stenosed.  Prox LAD lesion is 90% stenosed.  Prox LAD to Mid LAD lesion is 50% stenosed.  Mid Cx lesion is 100% stenosed.  Prox RCA to Mid RCA lesion is 90% stenosed.  RPDA lesion is 95% stenosed.  LV end diastolic pressure is severely elevated.  Hemodynamic findings consistent with moderate pulmonary hypertension. 1. Severe 3 vessel obstructive CAD 2. High LV filling pressures 3. Reduced cardiac output with index 2.3 4. Moderate pulmonary HTN.  3/30TTE >> 1. Left ventricular ejection fraction, by estimation, is 30 to 35%. The left ventricle has moderately decreased function. The left ventricle demonstrates regional wall motion abnormalities.There is mild left ventricular hypertrophy. Left ventricular diastolic function could not be evaluated. There is severe hypokinesis of the left ventricular, entire inferior wall, inferoseptal wall, apical segment and lateral wall.  2. Right ventricular systolic function is hyperdynamic. The right ventricular size is normal. There is moderately elevated pulmonary artery systolic pressure.  3. Decreased posterior leaflet motion due to ischemic tethering of the mitral valve leaflets.  4. The mitral valve is abnormal. Moderate to severe mitral valve regurgitation.  5. The aortic valve is tricuspid. Aortic valve regurgitation is not visualized. Mild aortic valve sclerosis is present, with no evidence of aortic valve stenosis.  6. The inferior vena cava is normal in size with greater than 50% respiratory variability, suggesting right atrial pressure of 3 mmHg.   4/1 CT chest w/o >>Large bilateral pleural effusions with associated atelectasis.Moderate to severe bilateral ground-glass opacities, likely  reflecting pneumonia and/or edema.  4/1 RHC >> RA =  18 RV = 60/20  PA = 63/29 (42) PCW = 33 Fick cardiac output/index = 3.7/1.9 PVR = 2.5 WU FA sat = 98% PA sat = 47%, 49% PaPi = 2.2   Micro Data:  5/7 R pleural fluid neg 5/7 blood neg to date 5/6 tracheal aspirate enterobacter  Antimicrobials:    Interim history/subjective:  Did not sleep well. Had some emesis yesterday, minimal stool.  Objective   Blood pressure (!) 106/44, pulse 71, temperature (!) 93 F (33.9 C), temperature source Axillary, resp. rate 15, height _0  (1.803 m), weight 58.2 kg, SpO2 100 %. CVP:  [3 mmHg-10 mmHg] 7 mmHg  Vent Mode: PRVC FiO2 (%):  [40 %] 40 % Set Rate:  [30 bmp] 30 bmp Vt Set:  [560 mL] 560 mL Plateau Pressure:  [27 cmH20-30 cmH20] 30 cmH20   Intake/Output Summary (Last 24 hours) at 05/30/2019 1122 Last data filed at 05/30/2019 1103 Gross per 24 hour  Intake 4956.88 ml  Output 993 ml  Net 3963.88 ml   Filed Weights   05/28/19 0410 05/29/19 0500 05/30/19 0500  Weight: 54.8 kg 56.8 kg 58.2 kg    Examination:   GEN: Frail elderly man on vent HEENT: Tracheostomy in place, mild mucoid secretions CV: Heart sounds regular, extremities are warm PULM: transmitted upper airway sounds today, chest tube in place with serous fluid, no air leak GI: Soft, hyperactive bowel sounds, NG tube in place EXT: Profound muscle wasting with improving superimposed anasarca NEURO: He will move all 4 extremities briskly to command PSYCH: RASS 0 this AM SKIN: He has multiple areas of bruising and skin breakdown  Labs with slight H/H drop, transfusing 1 unit CXR stable  Resolved Problems   Agitated delirium- improved - seroquel qHS, continue melatonin - continue PRN oxycodone - re-orient, encourage day/night cycles  Assessment & Plan:   Recurrent hypoxemic and hypercarbic respiratory failure after CABG now s/p trach.  Multifactorial.  See discussion in 5/8 note.  Enterobacter in  sputum being treated.  - PS trials during day, vent at night - Chest tube management per primary, can place pigtail in left pleural space PRN - cefepime x 7 days  Shock-again multifactorial.  See discussion in 5/8 note. I think at this point it is just cardiogenic and renal vasoplegic. - Continue pressors and high dose midodrine, not sure what else to do here, goal MAP 60, improving slowly  ESRD- continue CRRT, eventual goal to transition to iHD  Constipation/colonic ileus - Received relistor 5/13, already on aggressive bowel regimen, add neostigmine and smog enema - Careful with TF  Dysphagia- continue NGT+ TF, needs PEG at some point  Blood sugars- improved  Profound muscular deconditioning- biggest barrier to recovery at this point  Goals of care- continue current level of care with goal of CIR with iHD, palliative has established baseline   Best practice:  Diet: Tube feeds Pain/Anxiety/Delirium protocol (if indicated): see above VAP protocol (if indicated):HOB 30 degrees  DVT prophylaxis: ASA 325m GI prophylaxis: PPI Glucose control:SSI Mobility: BR Code Status: Full  Family Communication:per primary team Disposition:ICU pending CRRT and vent liberation  The patient is critically ill with multiple organ systems failure and requires high complexity decision making for assessment and support, frequent evaluation and titration of therapies, application of advanced monitoring technologies and extensive interpretation of multiple databases. Critical Care Time devoted to patient care services described in this note independent of APP/resident time (if applicable)  is 32 minutes.   DErskine EmeryMD [place] [facility]  Critical Care 05/30/2019 11:25 AM Personal pager: #[phone] If unanswered, please page CCM On-call: [phone]

## 2019-05-30 NOTE — Progress Notes (Signed)
Patient ID: Brendan [name], male   DOB: 06-21-1955, 64 y.o.   MRN: [id number]     Advanced Heart Failure Rounding Note  PCP-Cardiologist: No primary care provider on file.   Subjective:    Remains on vent and CRRT - running mildly positive. NE increased back to 9 this am for hypotension and co-ox 39%. CVP 7  Sleepy this am. Struggling with severe constipation.   Hgb 6.9. Getting 1u RBCs   Objective:   Weight Range: 58.2 kg Body mass index is 17.9 kg/m.   Vital Signs:   Temp:  [92.5 F (33.6 C)-93 F (33.9 C)] 92.5 F (33.6 C) (05/14 1200) Pulse Rate:  [67-83] 73 (05/14 1300) Resp:  [10-30] 13 (05/14 1300) BP: (81-115)/(37-65) 104/65 (05/14 1300) SpO2:  [89 %-100 %] 100 % (05/14 1300) Arterial Line BP: (75-133)/(34-56) 121/50 (05/14 1300) FiO2 (%):  [40 %] 40 % (05/14 1157) Weight:  [58.2 kg] 58.2 kg (05/14 0500) Last BM Date: 05/29/19  Weight change: Filed Weights   05/28/19 0410 05/29/19 0500 05/30/19 0500  Weight: 54.8 kg 56.8 kg 58.2 kg    Intake/Output:   Intake/Output Summary (Last 24 hours) at 05/30/2019 1323 Last data filed at 05/30/2019 1300 Gross per 24 hour  Intake 5107.17 ml  Output 1166 ml  Net 3941.17 ml      Physical Exam   General:  Thin cachetic. On vent through trach Sleepy y HEENT: normal + temporal wasting Neck: supple. no JVD. + trach Carotids 2+ bilat; no bruits. No lymphadenopathy or thryomegaly appreciated. Cor: PMI nondisplaced. Regular rate & rhythm. No rubs, gallops or murmurs. Lungs: coarse Abdomen: soft, nontender, nondistended. No hepatosplenomegaly. No bruits or masses. Good bowel sounds. Extremities: no cyanosis, clubbing, rash, trace edema Neuro: alert & orientedx3, cranial nerves grossly intact. moves all 4 extremities w/o difficulty. Affect pleasant   Telemetry   NSR 70s Personally reviewed   Labs    CBC Recent Labs    05/29/19 0855 05/29/19 0855 05/30/19 0445 05/30/19 0451  WBC 9.6  --   --  8.1  HGB 7.6*    < > 6.8* 6.9*  HCT 25.0*   < > 20.0* 22.1*  MCV 100.8*  --   --  99.5  PLT 132*  --   --  PLATELET CLUMPS NOTED ON SMEAR, UNABLE TO ESTIMATE   < > = values in this interval not displayed.   Basic Metabolic Panel Recent Labs    05/29/19 0855 05/29/19 0855 05/29/19 1612 05/29/19 1612 05/30/19 0445 05/30/19 0451  NA 137   < > 140   < > 137 137  K 5.1   < > 4.8   < > 4.6 4.6  CL 102   < > 105  --   --  105  CO2 27   < > 27  --   --  24  GLUCOSE 88   < > 153*  --   --  49*  BUN 20   < > 17  --   --  12  CREATININE 0.99   < > 0.93  --   --  0.77  CALCIUM 8.5*   < > 8.5*  --   --  8.3*  MG 2.5*  --   --   --   --  2.2  PHOS 2.5   < > 2.2*  --   --  1.5*   < > = values in this interval not displayed.   Liver Function Tests Recent Labs  05/29/19 1612 05/30/19 0451  ALBUMIN 3.2* 3.1*   No results for input(s): LIPASE, AMYLASE in the last 72 hours. Cardiac Enzymes No results for input(s): CKTOTAL, CKMB, CKMBINDEX, TROPONINI in the last 72 hours.  BNP: BNP (last 3 results) Recent Labs    04/01/2019 0113  BNP 655.7*    ProBNP (last 3 results) No results for input(s): PROBNP in the last 8760 hours.   D-Dimer No results for input(s): DDIMER in the last 72 hours. Hemoglobin A1C No results for input(s): HGBA1C in the last 72 hours. Fasting Lipid Panel No results for input(s): CHOL, HDL, LDLCALC, TRIG, CHOLHDL, LDLDIRECT in the last 72 hours. Thyroid Function Tests No results for input(s): TSH, T4TOTAL, T3FREE, THYROIDAB in the last 72 hours.  Invalid input(s): FREET3  Other results:   Imaging    DG Chest Port 1 View  Result Date: 05/30/2019 CLINICAL DATA:  Status post cardiac surgery. EXAM: PORTABLE CHEST 1 VIEW COMPARISON:  Chest x-ray from yesterday. FINDINGS: Unchanged tracheostomy and feeding tubes. Unchanged tunneled left internal jugular dialysis catheter. Unchanged right upper extremity PICC line and right chest tube. Stable cardiomegaly status post CABG  and mitral valve repair. Diffuse interstitial and patchy airspace opacities have mildly worsened in the left upper lobe and right lung base. No pneumothorax or pleural effusion. No acute osseous abnormality. IMPRESSION: 1. Extensive bilateral interstitial and airspace disease, worsened in the left upper lobe and right lung base. Electronically Signed   By: Titus Dubin M.D.   On: 05/30/2019 07:48   DG Abd Portable 1V  Result Date: 05/30/2019 CLINICAL DATA:  Constipation. EXAM: PORTABLE ABDOMEN - 1 VIEW COMPARISON:  Abdominal x-ray dated May 24, 2019. FINDINGS: Feeding tube tip near the pylorus. Progressive moderate to large colonic stool burden with new mild distention of the transverse colon. Prominent nondilated air-filled loops of small bowel in the left lower quadrant suggestive of ileus. No radio-opaque calculi or other significant radiographic abnormality are seen. Prior cholecystectomy. No acute osseous abnormality. Unchanged bibasilar airspace disease. IMPRESSION: Progressive moderate to large colonic stool burden with increased mild distention of the transverse colon. Electronically Signed   By: Titus Dubin M.D.   On: 05/30/2019 10:17     Medications:     Scheduled Medications: . aspirin EC  325 mg Oral Daily   Or  . aspirin  324 mg Per Tube Daily  . atropine      . B-complex with vitamin C  1 tablet Per Tube Daily  . chlorhexidine gluconate (MEDLINE KIT)  15 mL Mouth Rinse BID  . Chlorhexidine Gluconate Cloth  6 each Topical Q0600  . citalopram  10 mg Per Tube Daily  . darbepoetin (ARANESP) injection - NON-DIALYSIS  100 mcg Subcutaneous Q Tue-1800  . docusate  200 mg Per Tube Daily  . EPINEPHrine      . feeding supplement (PRO-STAT SUGAR FREE 64)  30 mL Per Tube BID  . gabapentin  100 mg Oral Q8H  . insulin aspart  0-6 Units Subcutaneous Q4H  . mouth rinse  15 mL Mouth Rinse 10 times per day  . melatonin  3 mg Per Tube QHS  . metoCLOPramide (REGLAN) injection  10 mg  Intravenous Q6H  . midodrine  20 mg Per Tube Q8H  . neomycin-polymyxin-hydrocortisone  3 drop Left EAR Q12H  . neostigmine  0.25 mg Subcutaneous Q6H  . pantoprazole sodium  40 mg Per Tube BID  . polyethylene glycol  17 g Oral Daily  . QUEtiapine  25 mg  Oral QHS  . rOPINIRole  0.5 mg Oral QHS  . sennosides  5 mL Per Tube BID  . sodium chloride flush  10-40 mL Intracatheter Q12H  . sorbitol, milk of mag, mineral oil, glycerin (SMOG) enema  960 mL Rectal Once    Infusions: .  prismasol BGK 4/2.5 500 mL/hr at 05/30/19 0505  .  prismasol BGK 4/2.5 300 mL/hr at 05/30/19 1026  . sodium chloride Stopped (05/29/19 1600)  . sodium chloride    . sodium chloride 150 mL/hr at 05/30/19 1300  . albumin human Stopped (05/30/19 1054)  . dextrose 5 % and 0.9% NaCl 40 mL/hr at 05/30/19 1300  . feeding supplement (VITAL 1.5 CAL) 1,000 mL (05/30/19 1100)  . norepinephrine (LEVOPHED) Adult infusion 9 mcg/min (05/30/19 1300)  . prismasol BGK 4/2.5 1,500 mL/hr at 05/30/19 1243  . sodium phosphate  Dextrose 5% IVPB 43 mL/hr at 05/30/19 1300    PRN Medications: Place/Maintain arterial line **AND** sodium chloride, sodium chloride, alum & mag hydroxide-simeth, dextrose, diphenhydrAMINE, fentaNYL (SUBLIMAZE) injection, Gerhardt's butt cream, heparin, heparin, levalbuterol, ondansetron (ZOFRAN) IV, oxyCODONE, pneumococcal 23 valent vaccine, sodium chloride flush, sorbitol     Assessment/Plan   1. Acute systolic HF ->  Cardiogenic Shock  - Due to iCM. EF 20-25% - Impella 5.5 placed on 4/2.   - s/p CABG/MVRepair on 4/6  - Impella out 4/9.  - Echo 4/10 with EF 25-30%, normal RV, no MR.  - Echo 4/22 EF 30-35% mildly reduced RV MVR ok   - NE turned down overnight but now back up due to low BP and falling co-ox - CO-OX now 39%. NE increased back to 9 + midodrine 20 mg tid.  - Repeat echo 05/29/19. EF 35-40% RV ok.  - Degree of cardiogenic shock seems out of proportion to LV/RV function. Unfortunately every  time we try to wean NE his co-ox drops markedly and he has clearly become inotrope dependent. Unclear what next steps are. Will add low dose dobutamine and try to wean NE. Unfortunately with need for HD, not candidate for home or SNF inotropes.   2. 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 Echo stable - No s/s ischemia  - On ASA/Crestor  3. Acute hypoxic respiratory failure - Due to pulmonary edema and PNA - Extubated 4/9 re-intubated on 4/12. Failed re-extubation on 4/14. Now s/p tracheostom - S/P Bronch 4/27 - 60K yeast. -> ? Colonization.  - CT scan 5/7 with diffuse bilateral infiltrates c/w ARDS and large bilateral effusions R>L.  - R chest tube placed with > 1L out. Continues to drain ~ 20/hr - Remains on vent FiO2 40% - CCM following. Now weaning to PS during day and full vent at night. Unchanged  4. Mitral Regurgitation - Mod-severe on ECHO - s/p MV repair, TEE 4/9 with minimal MR s/p repair.   - Echo 4/10 with no significant MR.  - echo 4/22 MVR stable - Repeat echo 05/29/19. EF 35-40% RV ok.  MVR ok   5. Uncontrolled DM - Hgb A1C 9.  - On insulin and sliding scale.  - No change  6. AKI - due to shock - Remains anuric post-op - Continue CVVHD per Nephrology. Keeping slightly positive today to help with low BP/co-ox  7. Acute blood loss Anemia  - Transfused 1UPRBCs 05/25/19 - hgb 6.8 today. Transfuse 1uRBC - No obvious site of bleeding.   8. Polymorphic VT - Recurrent VT during respiratory  distress 4/14, - Off amio  - No further VT.  - Keep K> 4.0 Mg > 2.0  - Rhythm stable   9. Severe Malnutrition - Prealbumin improved, up from 8.9>>18.7 - Nutrition on board - Continue w/ TFs  10. Atrial fibrillation. paroxysmal - Remains in NSR.  - Amiodarone was stopped.   47. Debility, severe - as above will likely need LTAC on d/c  12. Chronic pain issues - appears comfortable - having severe constipation. CCM managing   Remains very  tenuous. I am worried that he doesn't have a clear way out of this, I will see again Monday. Call over weekend for questions.   CRITICAL CARE Performed by: Glori Bickers  Total critical care time: 35 minutes  Critical care time was exclusive of separately billable procedures and treating other patients.  Critical care was necessary to treat or prevent imminent or life-threatening deterioration.  Critical care was time spent personally by me (independent of midlevel providers or residents) on the following activities: development of treatment plan with patient and/or surrogate as well as nursing, discussions with consultants, evaluation of patient's response to treatment, examination of patient, obtaining history from patient or surrogate, ordering and performing treatments and interventions, ordering and review of laboratory studies, ordering and review of radiographic studies, pulse oximetry and re-evaluation of patient's condition.     Glori Bickers, MD  1:23 PM

## 2019-05-30 NOTE — Progress Notes (Signed)
On change of shift assessment with outgoing RN, mid coccyx wound which has been charted as deep tissue injury is now a stage 3 pressure wound. Patient advised to keep to  turned side until staff repositions him. Patient is not compliant with q2 turns. He is able to get himself supine after he has been turned by staff

## 2019-05-30 NOTE — Progress Notes (Signed)
[facility] BRIEF SICU PROGRESS NOTE  28 Days Post-Op  S/P Procedure(s) (LRB): VIDEO BRONCHOSCOPY USING DISPOSABLE ANESTHESIA SCOPE (N/A) TRACHEOSTOMY (N/A)   Stable day  Plan: Continue current plan  Rexene Alberts, MD 05/30/2019 6:46 PM

## 2019-05-30 NOTE — Progress Notes (Signed)
Patient ID: Brendan [name], male   DOB: 10/14/1955, 64 y.o.   MRN: [id number]    S:  Remains on CRRT. Positive over last 24hrs as planned. Continue on pressors. No UOP. Does not express concerns  O:BP (!) 106/44   Pulse 68   Temp (!) 93.4 F (34.1 C) (Oral)   Resp 13   Ht _0  (1.803 m) Comment: measured x 3  Wt 58.2 kg   SpO2 100%   BMI 17.90 kg/m   Intake/Output Summary (Last 24 hours) at 05/30/2019 0932 Last data filed at 05/30/2019 0800 Gross per 24 hour  Intake 4683.68 ml  Output 1346 ml  Net 3337.68 ml   Intake/Output: I/O last 3 completed shifts: In: 5796.9 [I.V.:4088.6; NG/GT:1255; IV Piggyback:453.3] Out: 1763 [Other:1513; Chest Tube:250]  Intake/Output this shift:  Total I/O In: 173.8 [I.V.:173.8] Out: 40 [Other:40] Weight change: 1.4 kg Gen: frail, thin, cachetic, alert Neck: lef IJ vascath (placed 5/3) CVS: normal rate Resp: decreased BS at bases, ventilated/trached Abd: +BS, soft, NT/ND Ext: no edema- atrophied legs   Recent Labs  Lab 05/27/19 0530 05/27/19 0814 05/27/19 1550 05/28/19 0408 05/28/19 0417 05/28/19 1656 05/29/19 0501 05/29/19 0855 05/29/19 1612 05/30/19 0445 05/30/19 0451  NA 137   < > 136   < > 136 137 136 137 140 137 137  K 4.5   < > 4.9   < > 4.9 4.8 5.3* 5.1 4.8 4.6 4.6  CL 102  --  100  --  102 102  --  102 105  --  105  CO2 24  --  25  --  24 26  --  27 27  --  24  GLUCOSE 130*  --  197*  --  98 99  --  88 153*  --  49*  BUN 28*  --  29*  --  25* 26*  --  20 17  --  12  CREATININE 1.15  --  1.07  --  0.90 1.04  --  0.99 0.93  --  0.77  ALBUMIN 3.2*  3.2*  --  3.0*  --  3.1* 3.4*  --  3.3* 3.2*  --  3.1*  CALCIUM 9.1  --  8.5*  --  8.7* 8.6*  --  8.5* 8.5*  --  8.3*  PHOS 1.3*  --  3.7  --  2.5 3.0  --  2.5 2.2*  --  1.5*  AST 29  --   --   --   --   --   --   --   --   --   --   ALT 15  --   --   --   --   --   --   --   --   --   --    < > = values in this interval not displayed.   Liver Function Tests: Recent Labs   Lab 05/27/19 0530 05/27/19 1550 05/29/19 0855 05/29/19 1612 05/30/19 0451  AST 29  --   --   --   --   ALT 15  --   --   --   --   ALKPHOS 294*  --   --   --   --   BILITOT 0.9  --   --   --   --   PROT 7.1  --   --   --   --   ALBUMIN 3.2*  3.2*   < > 3.3* 3.2*  3.1*   < > = values in this interval not displayed.   No results for input(s): LIPASE, AMYLASE in the last 168 hours. No results for input(s): AMMONIA in the last 168 hours. CBC: Recent Labs  Lab 05/26/19 0445 05/26/19 0445 05/27/19 0530 05/27/19 0814 05/28/19 0417 05/29/19 0501 05/29/19 0855 05/30/19 0445 05/30/19 0451  WBC 9.8   < > 8.7   < > 6.8  --  9.6  --  8.1  HGB 8.1*   < > 9.0*   < > 8.9*   < > 7.6* 6.8* 6.9*  HCT 26.2*   < > 29.5*   < > 28.7*   < > 25.0* 20.0* 22.1*  MCV 96.7  --  97.4  --  98.0  --  100.8*  --  99.5  PLT 193   < > 205   < > 170  --  132*  --  PLATELET CLUMPS NOTED ON SMEAR, UNABLE TO ESTIMATE   < > = values in this interval not displayed.   Cardiac Enzymes: No results for input(s): CKTOTAL, CKMB, CKMBINDEX, TROPONINI in the last 168 hours. CBG: Recent Labs  Lab 05/29/19 2117 05/30/19 0001 05/30/19 0442 05/30/19 0724 05/30/19 0800  GLUCAP 107* 47* 43* 64* 93    Iron Studies: No results for input(s): IRON, TIBC, TRANSFERRIN, FERRITIN in the last 72 hours. Studies/Results: DG Chest Port 1 View  Result Date: 05/30/2019 CLINICAL DATA:  Status post cardiac surgery. EXAM: PORTABLE CHEST 1 VIEW COMPARISON:  Chest x-ray from yesterday. FINDINGS: Unchanged tracheostomy and feeding tubes. Unchanged tunneled left internal jugular dialysis catheter. Unchanged right upper extremity PICC line and right chest tube. Stable cardiomegaly status post CABG and mitral valve repair. Diffuse interstitial and patchy airspace opacities have mildly worsened in the left upper lobe and right lung base. No pneumothorax or pleural effusion. No acute osseous abnormality. IMPRESSION: 1. Extensive bilateral  interstitial and airspace disease, worsened in the left upper lobe and right lung base. Electronically Signed   By: Titus Dubin M.D.   On: 05/30/2019 07:48   DG Chest Port 1 View  Result Date: 05/29/2019 CLINICAL DATA:  Recent CABG. EXAM: PORTABLE CHEST 1 VIEW COMPARISON:  Chest x-ray from yesterday. FINDINGS: Unchanged tracheostomy and feeding tubes. Unchanged tunneled left internal jugular dialysis catheter. Unchanged right upper extremity PICC line and right chest tube. Stable cardiomediastinal silhouette status post CABG and mitral valve repair. Diffuse interstitial and scattered patchy airspace opacities are not significantly changed. No pneumothorax or pleural effusion. No acute osseous abnormality. IMPRESSION: 1. Unchanged bilateral interstitial and airspace disease. Electronically Signed   By: Titus Dubin M.D.   On: 05/29/2019 08:29   ECHOCARDIOGRAM LIMITED  Result Date: 05/29/2019    ECHOCARDIOGRAM LIMITED REPORT   Patient Name:   Brendan [name] Date of Exam: 05/29/2019 Medical Rec #:  [id number]        Height:       71.0 in Accession #:    [id number]       Weight:       125.2 lb Date of Birth:  [birth year]        BSA:          1.728 m Patient Age:    41 years         BP:           101/40 mmHg Patient Gender: M                HR:  73 bpm. Exam Location:  Inpatient Procedure: Limited Echo, Limited Color Doppler and Cardiac Doppler Indications:    ischemic cardiomyopathy 414.8  History:        Patient has prior history of Echocardiogram examinations, most                 recent 05/08/2019. Prior CABG.                  Mitral Valve: valve is present in the mitral position.  Sonographer:    Johny Chess Referring Phys: [place]  1. Left ventricular ejection fraction, by estimation, is 35 to 40%. The left ventricle has moderately decreased function. The left ventricle demonstrates regional wall motion abnormalities (see scoring diagram/findings for  description). Left ventricular  diastolic function could not be evaluated.  2. The right ventricular size is normal.  3. The mitral valve has been repaired/replaced. No evidence of mitral valve regurgitation. No evidence of mitral stenosis. There is a present in the mitral position.  4. The aortic valve is tricuspid. Aortic valve regurgitation is not visualized. Mild aortic valve sclerosis is present, with no evidence of aortic valve stenosis.  5. The inferior vena cava is normal in size with <50% respiratory variability, suggesting right atrial pressure of 8 mmHg. FINDINGS  Left Ventricle: Left ventricular ejection fraction, by estimation, is 35 to 40%. The left ventricle has moderately decreased function. The left ventricle demonstrates regional wall motion abnormalities. There is no left ventricular hypertrophy. Right Ventricle: The right ventricular size is normal. Left Atrium: Left atrial size was not well visualized. Right Atrium: Right atrial size was not well visualized. Pericardium: Trivial pericardial effusion is present. Mitral Valve: The mitral valve has been repaired/replaced. There is moderate thickening of the mitral valve leaflet(s). There is moderate calcification of the mitral valve leaflet(s). There is a present in the mitral position. No evidence of mitral valve  stenosis. Tricuspid Valve: The tricuspid valve is normal in structure. Tricuspid valve regurgitation is trivial. No evidence of tricuspid stenosis. Aortic Valve: The aortic valve is tricuspid. . There is mild thickening and mild calcification of the aortic valve. Aortic valve regurgitation is not visualized. Mild aortic valve sclerosis is present, with no evidence of aortic valve stenosis. There is mild thickening of the aortic valve. There is mild calcification of the aortic valve. Pulmonic Valve: The pulmonic valve was grossly normal. Pulmonic valve regurgitation is trivial. No evidence of pulmonic stenosis. Aorta: The aortic root was  not well visualized. Venous: The inferior vena cava is normal in size with less than 50% respiratory variability, suggesting right atrial pressure of 8 mmHg. IAS/Shunts: The interatrial septum was not assessed.  LEFT VENTRICLE PLAX 2D LVIDd:         5.50 cm LVIDs:         4.50 cm LV PW:         1.00 cm LV IVS:        1.00 cm LVOT diam:     1.80 cm LV SV:         45 LV SV Index:   26 LVOT Area:     2.54 cm  LV Volumes (MOD) LV vol d, MOD A2C: 72.8 ml LV vol s, MOD A2C: 46.3 ml LV SV MOD A2C:     26.5 ml LEFT ATRIUM         Index LA diam:    3.90 cm 2.26 cm/m  AORTIC VALVE LVOT Vmax:   85.50 cm/s  LVOT Vmean:  53.500 cm/s LVOT VTI:    0.178 m  AORTA Ao Root diam: 3.30 cm MITRAL VALVE MV Area (PHT): 3.65 cm     SHUNTS MV Decel Time: 208 msec     Systemic VTI:  0.18 m MV E velocity: 110.00 cm/s  Systemic Diam: 1.80 cm MV A velocity: 81.20 cm/s MV E/A ratio:  1.35 Buford Dresser MD Electronically signed by Buford Dresser MD Signature Date/Time: 05/29/2019/5:23:42 PM    Final    . sodium chloride   Intravenous Once  . aspirin EC  325 mg Oral Daily   Or  . aspirin  324 mg Per Tube Daily  . B-complex with vitamin C  1 tablet Per Tube Daily  . chlorhexidine gluconate (MEDLINE KIT)  15 mL Mouth Rinse BID  . Chlorhexidine Gluconate Cloth  6 each Topical Q0600  . citalopram  10 mg Per Tube Daily  . darbepoetin (ARANESP) injection - NON-DIALYSIS  100 mcg Subcutaneous Q Tue-1800  . docusate  200 mg Per Tube Daily  . feeding supplement (PRO-STAT SUGAR FREE 64)  30 mL Per Tube BID  . gabapentin  100 mg Oral Q8H  . insulin aspart  0-6 Units Subcutaneous Q4H  . mouth rinse  15 mL Mouth Rinse 10 times per day  . melatonin  3 mg Per Tube QHS  . metoCLOPramide (REGLAN) injection  10 mg Intravenous Q6H  . midodrine  20 mg Per Tube Q8H  . neomycin-polymyxin-hydrocortisone  3 drop Left EAR Q12H  . pantoprazole sodium  40 mg Per Tube BID  . polyethylene glycol  17 g Oral Daily  . QUEtiapine  25 mg Oral  QHS  . rOPINIRole  0.5 mg Oral QHS  . sennosides  5 mL Per Tube BID  . sodium chloride flush  10-40 mL Intracatheter Q12H    BMET    Component Value Date/Time   NA 137 05/30/2019 0451   K 4.6 05/30/2019 0451   CL 105 05/30/2019 0451   CO2 24 05/30/2019 0451   GLUCOSE 49 (L) 05/30/2019 0451   BUN 12 05/30/2019 0451   CREATININE 0.77 05/30/2019 0451   CALCIUM 8.3 (L) 05/30/2019 0451   GFRNONAA >60 05/30/2019 0451   GFRAA >60 05/30/2019 0451   CBC    Component Value Date/Time   WBC 8.1 05/30/2019 0451   RBC 2.22 (L) 05/30/2019 0451   HGB 6.9 (LL) 05/30/2019 0451   HCT 22.1 (L) 05/30/2019 0451   PLT PLATELET CLUMPS NOTED ON SMEAR, UNABLE TO ESTIMATE 05/30/2019 0451   MCV 99.5 05/30/2019 0451   MCH 31.1 05/30/2019 0451   MCHC 31.2 05/30/2019 0451   RDW 16.6 (H) 05/30/2019 0451   LYMPHSABS 1.1 05/05/2019 0402   MONOABS 1.1 (H) 04/26/2019 0402   EOSABS 0.2 04/28/2019 0402   BASOSABS 0.0 05/04/2019 0402    Brief HPI: admitted to outside hospital on 3/26 with SOB, new low EF and NSTEMI txd to [facility] 3/29: 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 Admission Scr was 0.99.  Assessment/Plan:  1. Acute hypoxic respiratory failure on 05/23/19- on vent via trach.  S/p right chest tube. Ventilation status unchanged. 2. Oliguric/anuricAKI- CRRT started on 4/10 and stopped 4/29 w/ attempted transition to IHD on 05/17/19 but failed and now back on CRRT 1. Remains anuric. 2. Resumed CRRT on 05/23/19 following acute worsening of his oxygenation and hypotension.  3. Dialysate 4K/2.5  Ca for replacement fluids and dialysate.  no heparin.  Running even to positive.  Will cont to run even and also will bolus outside of machine 4. Will keep on CRRT as long as no UOP and req pressors 3. Vascular access- leftIJ tunneled HD catheter placed by IR on 05/19/19- now 53 days old- hopefully  replace to tunneled cath next week; ideally would have off pressors 4. Anemia of CKD, multifactorial- dropped today below 7. Likely needs transfusion per primary team today. Darbo 150mg started 5/11. CTM and consider uptitrating as needed 5. CAD- 3 vessel with EF 25% s/p CABG and MVR 45/2/71complicated by cardiogenic shock as below 6. Acute systolic CHF and cardiogenic shock- still on pressors. Volume improved with CRRT/IHD.  On pretty max midodrine as well, unlike to benefit from higher dose. 7. Elytes- Hypophosphatemia today and will replace with Naphos 343ml today. Monitor phos daily. K acceptable today.  8. MR s/p MVR 9. Severe debility- will likely need LTAC if survives. [place] possibly in the [place]  [phone]

## 2019-05-30 NOTE — Progress Notes (Signed)
Hypoglycemic Event  CBG: 64  Treatment: 12.5g D50 IVP per hypoglycemia standing orders  Symptoms: None  Follow-up CBG: Time: 0800 CBG Result: 93  Possible Reasons for Event: Tube feeds off, D5NS running - will discuss w/ MD during rounds    [employer]

## 2019-05-30 NOTE — Progress Notes (Addendum)
TCTS DAILY ICU PROGRESS NOTE                   [address]            [place] [address]          [phone]   28 Days Post-Op Procedure(s) (LRB): VIDEO BRONCHOSCOPY USING DISPOSABLE ANESTHESIA SCOPE (N/A) TRACHEOSTOMY (N/A)  Total Length of Stay:  LOS: 46 days   Subjective: Patient on trach collar and sleeping. Wife and daughter at bedside.  Objective: Vital signs in last 24 hours: Temp:  [93.4 F (34.1 C)] 93.4 F (34.1 C) (05/13 1200) Pulse Rate:  [67-83] 69 (05/14 0800) Cardiac Rhythm: Normal sinus rhythm (05/13 2000) Resp:  [10-30] 24 (05/14 0800) BP: (92-115)/(37-50) 98/37 (05/14 0346) SpO2:  [94 %-100 %] 100 % (05/14 0800) Arterial Line BP: (81-133)/(35-55) 117/48 (05/14 0800) FiO2 (%):  [40 %] 40 % (05/14 0346) Weight:  [58.2 kg] 58.2 kg (05/14 0500)  Filed Weights   05/28/19 0410 05/29/19 0500 05/30/19 0500  Weight: 54.8 kg 56.8 kg 58.2 kg    Weight change: 1.4 kg   CVP:  [3 mmHg-10 mmHg] 7 mmHg  Intake/Output from previous day: 05/13 0701 - 05/14 0700 In: 5459.9 [I.V.:3886.7; NG/GT:1255; IV Piggyback:318.2] Out: 1289 [Chest Tube:250]  Intake/Output this shift: No intake/output data recorded.  Current Meds: Scheduled Meds: . aspirin EC  325 mg Oral Daily   Or  . aspirin  324 mg Per Tube Daily  . B-complex with vitamin C  1 tablet Per Tube Daily  . chlorhexidine gluconate (MEDLINE KIT)  15 mL Mouth Rinse BID  . Chlorhexidine Gluconate Cloth  6 each Topical Q0600  . citalopram  10 mg Per Tube Daily  . darbepoetin (ARANESP) injection - NON-DIALYSIS  100 mcg Subcutaneous Q Tue-1800  . docusate  200 mg Per Tube Daily  . feeding supplement (PRO-STAT SUGAR FREE 64)  30 mL Per Tube BID  . gabapentin  100 mg Oral Q8H  . insulin aspart  0-6 Units Subcutaneous Q4H  . mouth rinse  15 mL Mouth Rinse 10 times per day  . melatonin  3 mg Per Tube QHS  . metoCLOPramide (REGLAN) injection  10 mg Intravenous Q6H  . midodrine  20 mg Per Tube  Q8H  . neomycin-polymyxin-hydrocortisone  3 drop Left EAR Q12H  . pantoprazole sodium  40 mg Per Tube BID  . polyethylene glycol  17 g Oral Daily  . QUEtiapine  25 mg Oral QHS  . rOPINIRole  0.5 mg Oral QHS  . sennosides  5 mL Per Tube BID  . sodium chloride flush  10-40 mL Intracatheter Q12H   Continuous Infusions: .  prismasol BGK 4/2.5 500 mL/hr at 05/30/19 0505  .  prismasol BGK 4/2.5 300 mL/hr at 05/30/19 0504  . sodium chloride Stopped (05/29/19 1600)  . sodium chloride    . sodium chloride 150 mL/hr at 05/30/19 0700  . albumin human Stopped (05/30/19 0521)  . ceFEPime (MAXIPIME) IV Stopped (05/29/19 2235)  . dextrose 5 % and 0.9% NaCl 40 mL/hr at 05/30/19 0700  . feeding supplement (VITAL 1.5 CAL) Stopped (05/29/19 1500)  . norepinephrine (LEVOPHED) Adult infusion 7 mcg/min (05/30/19 0700)  . prismasol BGK 4/2.5 1,500 mL/hr at 05/30/19 0504   PRN Meds:.Place/Maintain arterial line **AND** sodium chloride, sodium chloride, alum & mag hydroxide-simeth, dextrose, diphenhydrAMINE, fentaNYL (SUBLIMAZE) injection, Gerhardt's butt cream, heparin, heparin, levalbuterol, ondansetron (ZOFRAN) IV, oxyCODONE, pneumococcal 23 valent vaccine, sodium chloride flush, sorbitol  Heart: RRR Lungs: Rhonchi Abdomen: Soft, bowel sounds present Extremities: No LE edema Wound: Clean and dry.  Right chest tube: Yellow pleural fluid draining  Lab Results: CBC: Recent Labs    05/29/19 0855 05/29/19 0855 05/30/19 0445 05/30/19 0451  WBC 9.6  --   --  8.1  HGB 7.6*   < > 6.8* 6.9*  HCT 25.0*   < > 20.0* 22.1*  PLT 132*  --   --  PLATELET CLUMPS NOTED ON SMEAR, UNABLE TO ESTIMATE   < > = values in this interval not displayed.   BMET:  Recent Labs    05/29/19 1612 05/29/19 1612 05/30/19 0445 05/30/19 0451  NA 140   < > 137 137  K 4.8   < > 4.6 4.6  CL 105  --   --  105  CO2 27  --   --  24  GLUCOSE 153*  --   --  49*  BUN 17  --   --  12  CREATININE 0.93  --   --  0.77  CALCIUM  8.5*  --   --  8.3*   < > = values in this interval not displayed.    CMET: Lab Results  Component Value Date   WBC 8.1 05/30/2019   HGB 6.9 (LL) 05/30/2019   HCT 22.1 (L) 05/30/2019   PLT PLATELET CLUMPS NOTED ON SMEAR, UNABLE TO ESTIMATE 05/30/2019   GLUCOSE 49 (L) 05/30/2019   CHOL 174 08/21/2017   TRIG 143 08/21/2017   HDL 57 08/21/2017   LDLCALC 88 08/21/2017   ALT 15 05/27/2019   AST 29 05/27/2019   NA 137 05/30/2019   K 4.6 05/30/2019   CL 105 05/30/2019   CREATININE 0.77 05/30/2019   BUN 12 05/30/2019   CO2 24 05/30/2019   TSH 0.925 04/13/2019   INR 1.6 (H) 05/01/2019   HGBA1C 9.0 (H) 04/08/2019   MICROALBUR 30 08/21/2017    PT/INR:  No results for input(s): LABPROT, INR in the last 72 hours. Radiology: [facility] Chest Port 1 View  Result Date: 05/30/2019 CLINICAL DATA:  Status post cardiac surgery. EXAM: PORTABLE CHEST 1 VIEW COMPARISON:  Chest x-ray from yesterday. FINDINGS: Unchanged tracheostomy and feeding tubes. Unchanged tunneled left internal jugular dialysis catheter. Unchanged right upper extremity PICC line and right chest tube. Stable cardiomegaly status post CABG and mitral valve repair. Diffuse interstitial and patchy airspace opacities have mildly worsened in the left upper lobe and right lung base. No pneumothorax or pleural effusion. No acute osseous abnormality. IMPRESSION: 1. Extensive bilateral interstitial and airspace disease, worsened in the left upper lobe and right lung base. Electronically Signed   By: Titus Dubin M.D.   On: 05/30/2019 07:48   ECHOCARDIOGRAM LIMITED  Result Date: 05/29/2019    ECHOCARDIOGRAM LIMITED REPORT   Patient Name:   Brendan [name] Date of Exam: 05/29/2019 Medical Rec #:  [id number]        Height:       71.0 in Accession #:    [id number]       Weight:       125.2 lb Date of Birth:  November 22, 1955        BSA:          1.728 m Patient Age:    64 years         BP:           101/40 mmHg Patient Gender: M  HR:            73 bpm. Exam Location:  Inpatient Procedure: Limited Echo, Limited Color Doppler and Cardiac Doppler Indications:    ischemic cardiomyopathy 414.8  History:        Patient has prior history of Echocardiogram examinations, most                 recent 05/08/2019. Prior CABG.                  Mitral Valve: valve is present in the mitral position.  Sonographer:    Johny Chess Referring Phys: [place]  1. Left ventricular ejection fraction, by estimation, is 35 to 40%. The left ventricle has moderately decreased function. The left ventricle demonstrates regional wall motion abnormalities (see scoring diagram/findings for description). Left ventricular  diastolic function could not be evaluated.  2. The right ventricular size is normal.  3. The mitral valve has been repaired/replaced. No evidence of mitral valve regurgitation. No evidence of mitral stenosis. There is a present in the mitral position.  4. The aortic valve is tricuspid. Aortic valve regurgitation is not visualized. Mild aortic valve sclerosis is present, with no evidence of aortic valve stenosis.  5. The inferior vena cava is normal in size with <50% respiratory variability, suggesting right atrial pressure of 8 mmHg. FINDINGS  Left Ventricle: Left ventricular ejection fraction, by estimation, is 35 to 40%. The left ventricle has moderately decreased function. The left ventricle demonstrates regional wall motion abnormalities. There is no left ventricular hypertrophy. Right Ventricle: The right ventricular size is normal. Left Atrium: Left atrial size was not well visualized. Right Atrium: Right atrial size was not well visualized. Pericardium: Trivial pericardial effusion is present. Mitral Valve: The mitral valve has been repaired/replaced. There is moderate thickening of the mitral valve leaflet(s). There is moderate calcification of the mitral valve leaflet(s). There is a present in the mitral position. No evidence of mitral  valve  stenosis. Tricuspid Valve: The tricuspid valve is normal in structure. Tricuspid valve regurgitation is trivial. No evidence of tricuspid stenosis. Aortic Valve: The aortic valve is tricuspid. . There is mild thickening and mild calcification of the aortic valve. Aortic valve regurgitation is not visualized. Mild aortic valve sclerosis is present, with no evidence of aortic valve stenosis. There is mild thickening of the aortic valve. There is mild calcification of the aortic valve. Pulmonic Valve: The pulmonic valve was grossly normal. Pulmonic valve regurgitation is trivial. No evidence of pulmonic stenosis. Aorta: The aortic root was not well visualized. Venous: The inferior vena cava is normal in size with less than 50% respiratory variability, suggesting right atrial pressure of 8 mmHg. IAS/Shunts: The interatrial septum was not assessed.  LEFT VENTRICLE PLAX 2D LVIDd:         5.50 cm LVIDs:         4.50 cm LV PW:         1.00 cm LV IVS:        1.00 cm LVOT diam:     1.80 cm LV SV:         45 LV SV Index:   26 LVOT Area:     2.54 cm  LV Volumes (MOD) LV vol d, MOD A2C: 72.8 ml LV vol s, MOD A2C: 46.3 ml LV SV MOD A2C:     26.5 ml LEFT ATRIUM         Index LA diam:    3.90 cm  2.26 cm/m  AORTIC VALVE LVOT Vmax:   85.50 cm/s LVOT Vmean:  53.500 cm/s LVOT VTI:    0.178 m  AORTA Ao Root diam: 3.30 cm MITRAL VALVE MV Area (PHT): 3.65 cm     SHUNTS MV Decel Time: 208 msec     Systemic VTI:  0.18 m MV E velocity: 110.00 cm/s  Systemic Diam: 1.80 cm MV A velocity: 81.20 cm/s MV E/A ratio:  1.35 Buford Dresser MD Electronically signed by Buford Dresser MD Signature Date/Time: 05/29/2019/5:23:42 PM    Final     Assessment/Plan: S/P Procedure(s) (LRB): VIDEO BRONCHOSCOPY USING DISPOSABLE ANESTHESIA SCOPE (N/A) TRACHEOSTOMY (N/A)  1. CV-S/p removal of Impella on 04/09.  S/p V tach arrest 04/15. Previous a fib. SR with HR in the 80's this am.  On  Midodrine 20 mg tid and Nor epinephrine 8  mcg/min.Marland Kitchen Receiving intermittent CVVHD and remains pressor dependent. Co ox this am 39 (has been off Milrinone for awhile). 2. Pulmonary-S/p trach 04/16.  Right chest tube with 250 last 24 hours. CXR this am appears stable.  On trach collar yesterday and today. 3. Expected post op blood loss anemia-H and H yesterday slightly increased to 6.9 and 22.1. Being transfused. Continue Aranesp 4. DM-CBGs 43/64/93. On Dextrose to help with hypoglycemia. He was on Metformin 1000 mg bid prior to surgery, but will continue on Insulin for now as NPO. Pre op HGA1C 9. He will need close medical follow up after discharge. 5. Oliguric/anuric AKI-Creatinine 0.77 this am. Nephrology following and arranging for CRRT accordingly. Per nephrology, continue CRRT as long as no UOP and needs pressors 6. GI-severe malnutrition of chronic illness.  Cortrak, TFs. Speech pathology evaluation recommended PEG. Had vomiting yesterday so TFs held. KUB this am showed moderate to large stool within colon and some distention of transverse colon. Enema to be given and TFs started slowly at 20 ml/hr. 7. Acute systolic heart failure-CVVHD helping with volume status 8. Previous elevated transaminases, AST 29, ALT 15 and alk phos down to 294. 9. Extremely deconditioned-will need PT as able.  10. ID-on Maxipime  [name] PA-C 05/30/2019 8:05 AM   received 2 U PRBC today for Hb 6.8- will try to wean norepi after transfusion Receiving enemas for constipation Repeat echo shows stable improvement in DVEF .35-40 with good RV, maintaining nsr Tol trach collar during day  patient examined and medical record reviewed,agree with above note. [name] 05/30/2019

## 2019-05-31 DIAGNOSIS — Z7189 Other specified counseling: Secondary | ICD-10-CM

## 2019-05-31 DIAGNOSIS — Z515 Encounter for palliative care: Secondary | ICD-10-CM

## 2019-05-31 LAB — GLUCOSE, CAPILLARY
Glucose-Capillary: 102 mg/dL — ABNORMAL HIGH (ref 70–99)
Glucose-Capillary: 103 mg/dL — ABNORMAL HIGH (ref 70–99)
Glucose-Capillary: 111 mg/dL — ABNORMAL HIGH (ref 70–99)
Glucose-Capillary: 114 mg/dL — ABNORMAL HIGH (ref 70–99)
Glucose-Capillary: 123 mg/dL — ABNORMAL HIGH (ref 70–99)
Glucose-Capillary: 85 mg/dL (ref 70–99)
Glucose-Capillary: 92 mg/dL (ref 70–99)

## 2019-05-31 LAB — CBC
HCT: 26.8 % — ABNORMAL LOW (ref 39.0–52.0)
Hemoglobin: 8.4 g/dL — ABNORMAL LOW (ref 13.0–17.0)
MCH: 29.6 pg (ref 26.0–34.0)
MCHC: 31.3 g/dL (ref 30.0–36.0)
MCV: 94.4 fL (ref 80.0–100.0)
Platelets: 104 10*3/uL — ABNORMAL LOW (ref 150–400)
RBC: 2.84 MIL/uL — ABNORMAL LOW (ref 4.22–5.81)
RDW: 18.8 % — ABNORMAL HIGH (ref 11.5–15.5)
WBC: 8.2 10*3/uL (ref 4.0–10.5)
nRBC: 0 % (ref 0.0–0.2)

## 2019-05-31 LAB — POCT I-STAT 7, (LYTES, BLD GAS, ICA,H+H)
Acid-Base Excess: 1 mmol/L (ref 0.0–2.0)
Bicarbonate: 25.6 mmol/L (ref 20.0–28.0)
Calcium, Ion: 1.22 mmol/L (ref 1.15–1.40)
HCT: 26 % — ABNORMAL LOW (ref 39.0–52.0)
Hemoglobin: 8.8 g/dL — ABNORMAL LOW (ref 13.0–17.0)
O2 Saturation: 99 %
Patient temperature: 97.8
Potassium: 4.6 mmol/L (ref 3.5–5.1)
Sodium: 139 mmol/L (ref 135–145)
TCO2: 27 mmol/L (ref 22–32)
pCO2 arterial: 37.7 mmHg (ref 32.0–48.0)
pH, Arterial: 7.438 (ref 7.350–7.450)
pO2, Arterial: 110 mmHg — ABNORMAL HIGH (ref 83.0–108.0)

## 2019-05-31 LAB — RENAL FUNCTION PANEL
Albumin: 2.9 g/dL — ABNORMAL LOW (ref 3.5–5.0)
Albumin: 2.8 g/dL — ABNORMAL LOW (ref 3.5–5.0)
Anion gap: 9 (ref 5–15)
Anion gap: 11 (ref 5–15)
BUN: 12 mg/dL (ref 8–23)
BUN: 13 mg/dL (ref 8–23)
CO2: 24 mmol/L (ref 22–32)
CO2: 23 mmol/L (ref 22–32)
Calcium: 8.2 mg/dL — ABNORMAL LOW (ref 8.9–10.3)
Calcium: 8.1 mg/dL — ABNORMAL LOW (ref 8.9–10.3)
Chloride: 104 mmol/L (ref 98–111)
Chloride: 105 mmol/L (ref 98–111)
Creatinine, Ser: 0.81 mg/dL (ref 0.61–1.24)
Creatinine, Ser: 1.14 mg/dL (ref 0.61–1.24)
GFR calc Af Amer: >60 mL/min (ref >60)
GFR calc Af Amer: >60 mL/min (ref >60)
GFR calc non Af Amer: >60 mL/min (ref >60)
GFR calc non Af Amer: >60 mL/min (ref >60)
Glucose, Bld: 91 mg/dL (ref 70–99)
Glucose, Bld: 134 mg/dL — ABNORMAL HIGH (ref 70–99)
Phosphorus: 2 mg/dL — ABNORMAL LOW (ref 2.5–4.6)
Phosphorus: 3.3 mg/dL (ref 2.5–4.6)
Potassium: 4.8 mmol/L (ref 3.5–5.1)
Potassium: 4.7 mmol/L (ref 3.5–5.1)
Sodium: 137 mmol/L (ref 135–145)
Sodium: 139 mmol/L (ref 135–145)

## 2019-05-31 LAB — COOXEMETRY PANEL
Carboxyhemoglobin: 2.9 % — ABNORMAL HIGH (ref 0.5–1.5)
Methemoglobin: 1.5 % (ref 0.0–1.5)
O2 Saturation: 77.8 %
Total hemoglobin: 9.3 g/dL — ABNORMAL LOW (ref 12.0–16.0)

## 2019-05-31 LAB — MAGNESIUM: Magnesium: 2.2 mg/dL (ref 1.7–2.4)

## 2019-05-31 MED ORDER — VITAL 1.5 CAL PO LIQD
1000.0000 mL | ORAL | Status: DC
  Filled 2019-05-31: qty 1000

## 2019-05-31 MED ORDER — SODIUM PHOSPHATES 45 MMOLE/15ML IV SOLN
20.0000 mmol | Freq: Once | INTRAVENOUS | Status: AC
  Administered 2019-05-31: 20 mmol via INTRAVENOUS
  Filled 2019-05-31: qty 6.67

## 2019-05-31 MED ORDER — VITAL 1.5 CAL PO LIQD
1000.0000 mL | ORAL | Status: DC
  Administered 2019-06-01: 1000 mL
  Filled 2019-05-31: qty 1000

## 2019-05-31 NOTE — Progress Notes (Signed)
Patient ID: Brendan [name], male   DOB: 06-Dec-1955, 64 y.o.   MRN: [id number]     Advanced Heart Failure Rounding Note  PCP-Cardiologist: No primary care provider on file.   Subjective:    Remains on vent and CRRT  Yesterday dobutamine added for low co-ox 39% and inability to wean NE.   Co-ox 78% this am. NE down 9 -> 4. SBP 120s.   Sleepy after receiving pain meds but will arouse.   CVP 5. CRRT keeping even to -50.   Objective:   Weight Range: 62.2 kg Body mass index is 19.13 kg/m.   Vital Signs:   Temp:  [92.5 F (33.6 C)-97.8 F (36.6 C)] 97.2 F (36.2 C) (05/15 0800) Pulse Rate:  [68-102] 84 (05/15 0800) Resp:  [12-30] 20 (05/15 0800) BP: (81-120)/(38-67) 114/66 (05/15 0800) SpO2:  [86 %-100 %] 100 % (05/15 0806) Arterial Line BP: (63-156)/(33-87) 127/56 (05/15 0800) FiO2 (%):  [40 %] 40 % (05/15 0806) Weight:  [62.2 kg] 62.2 kg (05/15 0600) Last BM Date: 05/29/19  Weight change: Filed Weights   05/29/19 0500 05/30/19 0500 05/31/19 0600  Weight: 56.8 kg 58.2 kg 62.2 kg    Intake/Output:   Intake/Output Summary (Last 24 hours) at 05/31/2019 0940 Last data filed at 05/31/2019 0800 Gross per 24 hour  Intake 6482.31 ml  Output 3136 ml  Net 3346.31 ml      Physical Exam   General:  Thin cachetic. On vent through trach Lethargic but arouses HEENT: normal + temporal wasting Neck: supple. no JVD. Carotids 2+ bilat; no bruits. No lymphadenopathy or thryomegaly appreciated. Cor: PMI nondisplaced. Regular rate & rhythm. No rubs, gallops or murmurs. + left [place] tunneled cath Lungs: coarse Abdomen: soft, nontender, nondistended. No hepatosplenomegaly. No bruits or masses. Good bowel sounds. Extremities: no cyanosis, clubbing, rash, edema Neuro: alert & orientedx3, cranial nerves grossly intact. moves all 4 extremities w/o difficulty. Affect pleasant   Telemetry   NSR 80s Personally reviewed   Labs    CBC Recent Labs    05/30/19 1606 05/30/19 1606  05/31/19 0439 05/31/19 0453  WBC 6.3  --   --  8.2  HGB 7.7*   < > 8.8* 8.4*  HCT 24.7*   < > 26.0* 26.8*  MCV 99.6  --   --  94.4  PLT PLATELET CLUMPS NOTED ON SMEAR, UNABLE TO ESTIMATE  --   --  104*   < > = values in this interval not displayed.   Basic Metabolic Panel Recent Labs    05/30/19 0451 05/30/19 0451 05/30/19 1606 05/30/19 1606 05/31/19 0439 05/31/19 0453  NA 137   < > 137   < > 139 137  K 4.6   < > 4.2   < > 4.6 4.8  CL 105   < > 103  --   --  104  CO2 24   < > 25  --   --  24  GLUCOSE 49*   < > 160*  --   --  91  BUN 12   < > 12  --   --  12  CREATININE 0.77   < > 0.75  --   --  0.81  CALCIUM 8.3*   < > 8.0*  --   --  8.2*  MG 2.2  --   --   --   --  2.2  PHOS 1.5*   < > 6.5*  --   --  2.0*   < > =  values in this interval not displayed.   Liver Function Tests Recent Labs    05/30/19 1606 05/31/19 0453  ALBUMIN 3.1* 2.9*   No results for input(s): LIPASE, AMYLASE in the last 72 hours. Cardiac Enzymes No results for input(s): CKTOTAL, CKMB, CKMBINDEX, TROPONINI in the last 72 hours.  BNP: BNP (last 3 results) Recent Labs    04/08/2019 0113  BNP 655.7*    ProBNP (last 3 results) No results for input(s): PROBNP in the last 8760 hours.   D-Dimer No results for input(s): DDIMER in the last 72 hours. Hemoglobin A1C No results for input(s): HGBA1C in the last 72 hours. Fasting Lipid Panel No results for input(s): CHOL, HDL, LDLCALC, TRIG, CHOLHDL, LDLDIRECT in the last 72 hours. Thyroid Function Tests No results for input(s): TSH, T4TOTAL, T3FREE, THYROIDAB in the last 72 hours.  Invalid input(s): FREET3  Other results:   Imaging    DG Abd Portable 1V  Result Date: 05/30/2019 CLINICAL DATA:  Constipation. EXAM: PORTABLE ABDOMEN - 1 VIEW COMPARISON:  Abdominal x-ray dated May 24, 2019. FINDINGS: Feeding tube tip near the pylorus. Progressive moderate to large colonic stool burden with new mild distention of the transverse colon. Prominent  nondilated air-filled loops of small bowel in the left lower quadrant suggestive of ileus. No radio-opaque calculi or other significant radiographic abnormality are seen. Prior cholecystectomy. No acute osseous abnormality. Unchanged bibasilar airspace disease. IMPRESSION: Progressive moderate to large colonic stool burden with increased mild distention of the transverse colon. Electronically Signed   By: Titus Dubin M.D.   On: 05/30/2019 10:17     Medications:     Scheduled Medications:  aspirin EC  325 mg Oral Daily   Or   aspirin  324 mg Per Tube Daily   B-complex with vitamin C  1 tablet Per Tube Daily   chlorhexidine gluconate (MEDLINE KIT)  15 mL Mouth Rinse BID   Chlorhexidine Gluconate Cloth  6 each Topical Q0600   citalopram  10 mg Per Tube Daily   darbepoetin (ARANESP) injection - NON-DIALYSIS  100 mcg Subcutaneous Q Tue-1800   docusate  200 mg Per Tube Daily   feeding supplement (PRO-STAT SUGAR FREE 64)  30 mL Per Tube BID   gabapentin  100 mg Oral Q8H   insulin aspart  0-6 Units Subcutaneous Q4H   mouth rinse  15 mL Mouth Rinse 10 times per day   melatonin  3 mg Per Tube QHS   metoCLOPramide (REGLAN) injection  10 mg Intravenous Q6H   midodrine  20 mg Per Tube Q8H   neomycin-polymyxin-hydrocortisone  3 drop Left EAR Q12H   neostigmine  0.25 mg Subcutaneous Q6H   pantoprazole sodium  40 mg Per Tube BID   polyethylene glycol  17 g Oral Daily   QUEtiapine  25 mg Oral QHS   rOPINIRole  0.5 mg Oral QHS   sennosides  5 mL Per Tube BID   sodium chloride flush  10-40 mL Intracatheter Q12H    Infusions:   prismasol BGK 4/2.5 500 mL/hr at 05/31/19 0145    prismasol BGK 4/2.5 300 mL/hr at 05/31/19 0239   sodium chloride Stopped (05/29/19 1600)   sodium chloride     sodium chloride 150 mL/hr at 05/31/19 0800   dextrose 5 % and 0.9% NaCl 40 mL/hr at 05/31/19 0800   DOBUTamine 2.5 mcg/kg/min (05/31/19 0800)   feeding supplement (VITAL 1.5  CAL) 1,000 mL (05/31/19 0930)   norepinephrine (LEVOPHED) Adult infusion 8 mcg/min (05/31/19 0800)  prismasol BGK 4/2.5 1,000 mL/hr at 05/31/19 0758   sodium phosphate  Dextrose 5% IVPB 43 mL/hr at 05/31/19 0800    PRN Medications: Place/Maintain arterial line **AND** sodium chloride, sodium chloride, alum & mag hydroxide-simeth, dextrose, diphenhydrAMINE, fentaNYL (SUBLIMAZE) injection, Gerhardt's butt cream, heparin, heparin, levalbuterol, ondansetron (ZOFRAN) IV, oxyCODONE, pneumococcal 23 valent vaccine, sodium chloride flush, sorbitol     Assessment/Plan   1. Acute systolic HF ->  Cardiogenic Shock  - Due to iCM. EF 20-25% - Impella 5.5 placed on 4/2.   - s/p CABG/MVRepair on 4/6  - Impella out 4/9.  - Echo 4/10 with EF 25-30%, normal RV, no MR.  - Echo 4/22 EF 30-35% mildly reduced RV MVR ok   - Dobutamine added 5/14 for low co-ox 39% and inability to wean NE.  - Has responded well to dobutamine 2.5. Wean NE today. Follw co-ox  - Repeat echo 05/29/19. EF 35-40% RV ok.  - Degree of cardiogenic shock seems out of proportion to LV/RV function. Unfortunately every time we try to wean NE his co-ox drops markedly and he has clearly become inotrope dependent. Now seems to be responding to dobutamine. Will continue to wean NE and assess response.   2. 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 Echo stable - No s/s ischemia - On ASA/Crestor  3. Acute hypoxic respiratory failure - Due to pulmonary edema and PNA - Extubated 4/9 re-intubated on 4/12. Failed re-extubation on 4/14. Now s/p tracheostomy - S/P Bronch 4/27 - 60K yeast. -> ? Colonization.  - CT scan 5/7 with diffuse bilateral infiltrates c/w ARDS and large bilateral effusions R>L.  - s/p R chest tube - Remains on vent FiO2 40% - CCM following. Once more awake today will try to resume PS trials   4. Mitral Regurgitation - Mod-severe on ECHO - s/p MV repair, TEE 4/9 with minimal MR s/p repair.    - Echo 4/10 with no significant MR.  - echo 4/22 MVR stable - Repeat echo 05/29/19. EF 35-40% RV ok.  MVR ok   5. Uncontrolled DM - Hgb A1C 9.  - On insulin and sliding scale.  - No change  6. AKI - due to shock - Remains anuric post-op - Continue CVVHD per Nephrology. Keep even to -68 today  7. Acute blood loss Anemia  - Transfused 1UPRBCs 5/9/21and 5/14 - hgb 8.4 today - No obvious site of bleeding.   8. Polymorphic VT - Recurrent VT during respiratory distress 4/14, - Off amio  - No further VT.  - Keep K> 4.0 Mg > 2.0  - Rhythm stable   9. Severe Malnutrition - Prealbumin improved, up from 8.9>>18.7 - Nutrition on board - Continue w/ TFs  10. Atrial fibrillation. paroxysmal - Remains in NSR.  - Amiodarone was stopped.   37. Debility, severe - as above will likely need LTAC on d/c  12. Chronic pain issues - appears comfortable - having severe constipation. CCM managing   Remains very tenuous. Plan as above. Wean NE. Continue dobutamine.   CRITICAL CARE Performed by: Glori Bickers  Total critical care time: 35 minutes  Critical care time was exclusive of separately billable procedures and treating other patients.  Critical care was necessary to treat or prevent imminent or life-threatening deterioration.  Critical care was time spent personally by me (independent of midlevel providers or residents) on the following activities: development of treatment plan with patient and/or surrogate as well as  nursing, discussions with consultants, evaluation of patient's response to treatment, examination of patient, obtaining history from patient or surrogate, ordering and performing treatments and interventions, ordering and review of laboratory studies, ordering and review of radiographic studies, pulse oximetry and re-evaluation of patient's condition.    Glori Bickers, MD  9:40 AM

## 2019-05-31 NOTE — Progress Notes (Signed)
      [address]       [place] [address]             [phone]        CARDIOTHORACIC SURGERY PROGRESS NOTE   R29 Days Post-Op Procedure(s) (LRB): VIDEO BRONCHOSCOPY USING DISPOSABLE ANESTHESIA SCOPE (N/A) TRACHEOSTOMY (N/A)  Subjective: Wakes up.  Very weak and non-communicative.  Objective: Vital signs: BP Readings from Last 1 Encounters:  05/31/19 109/63   Pulse Readings from Last 1 Encounters:  05/31/19 87   Resp Readings from Last 1 Encounters:  05/31/19 (!) 30   Temp Readings from Last 1 Encounters:  05/31/19 (!) 97.2 F (36.2 C) (Axillary)    Hemodynamics: CVP:  [7 mmHg-9 mmHg] 7 mmHg  Mixed venous co-ox 78%   Physical Exam:  Rhythm:   NSR  Breath sounds: shallow  Heart sounds:  RRR  Incisions:  Clean and dry  Abdomen:  Soft, non-distended, non-tender, tolerating tube feeds  Extremities:  Cool, adequately-perfused    Intake/Output from previous day: 05/14 0701 - 05/15 0700 In: 6045.4 [I.V.:4681; Blood:630; NG/GT:840; IV Piggyback:492.2] Out: 3135 [Chest Tube:580] Intake/Output this shift: Total I/O In: 693.7 [I.V.:565.6; NG/GT:40; IV Piggyback:88.1] Out: 81 [Other:81]  Lab Results:  CBC: Recent Labs    05/30/19 1606 05/30/19 1606 05/31/19 0439 05/31/19 0453  WBC 6.3  --   --  8.2  HGB 7.7*   < > 8.8* 8.4*  HCT 24.7*   < > 26.0* 26.8*  PLT PLATELET CLUMPS NOTED ON SMEAR, UNABLE TO ESTIMATE  --   --  104*   < > = values in this interval not displayed.    BMET:  Recent Labs    05/30/19 1606 05/30/19 1606 05/31/19 0439 05/31/19 0453  NA 137   < > 139 137  K 4.2   < > 4.6 4.8  CL 103  --   --  104  CO2 25  --   --  24  GLUCOSE 160*  --   --  91  BUN 12  --   --  12  CREATININE 0.75  --   --  0.81  CALCIUM 8.0*  --   --  8.2*   < > = values in this interval not displayed.     PT/INR:  No results for input(s): LABPROT, INR in the last 72 hours.  CBG (last 3)  Recent Labs    05/31/19 0003 05/31/19 0435  05/31/19 0802  GLUCAP 114* 92 85    ABG    Component Value Date/Time   PHART 7.438 05/31/2019 0439   PCO2ART 37.7 05/31/2019 0439   PO2ART 110 (H) 05/31/2019 0439   HCO3 25.6 05/31/2019 0439   TCO2 27 05/31/2019 0439   ACIDBASEDEF 3.0 (H) 04/28/2019 0735   O2SAT 77.8 05/31/2019 0455    CXR: n/a  Assessment/Plan: S/P Procedure(s) (LRB): VIDEO BRONCHOSCOPY USING DISPOSABLE ANESTHESIA SCOPE (N/A) TRACHEOSTOMY (N/A)  Essentially stable but still on IV pressors, co-ox up on dobutamine, CVP low Currently maintaining NSR VDRF s/p trach Right pleural effusion s/p chest tube - still draining serous fluid ARF tolerating CRRT, keeping volume even Anemia stable Severely deconditioned and malnourished   Continue current plan  Rexene Alberts, MD 05/31/2019 10:22 AM

## 2019-05-31 NOTE — Progress Notes (Signed)
NAME:  Brendan [name], MRN:  [id number], DOB:  [birth year], LOS: 23 ADMISSION DATE:  04/13/2019, CONSULTATION DATE:  04/17/19 REFERRING MD:  Dr. Haroldine Laws, CHIEF COMPLAINT:  Respiratory distress   Brief History   72 yoM originally presented with SOB and fatigue at [facility] found to have new HFrEF, NSTEMI, and bilateral pleural effusions transferred to [facility] on 3/29 for further cardiac evaluation. 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 Started on lasix and milrinone gtts however developed NSVT. Was taken 4/1 for placement of IABP and swan for optimization prior to CABG. Was on precedex for agitation/ confusion, some concern for DTs. On the evening of 4/1, patient developed worsening respiratory distress and hypoxia, PCCM consulted for intubation and vent management.  Patient was extubated on 4/14 and developed worsening hypoxia on BiPAP. Overnight he developed pulseless VT requiring 1 minute of CPR and epinephrine to achieve ROSC. Neurovascularly intact afterward. Subsequently had increased work of breathing and worsening respiratory distress requiring intubation. Patient had tracheostomy performed on 05/05/2019. Trying to wean off ventilator.   Past Medical History  Tobacco abuse, HTN, poorly controlled diabetes, diabetic neuropathy  Significant Hospital Events   3/30 Lt heart cath/TEE performed 4/1 Intubated, RHC/IABP/PA cath 4/2 Impella placement 4/3 febrile over evening and night. Blood cultures obtained. Vanc/cefepime started. Hematuria--heparin held. 4/4 Extubated,abx transitioned to rocephin. Heparin restarted 4/6 Re-intubated for CABG/MVR 4/7 Extubated from CABG to BiPAP 4/9 Impella removed  4/9 TEE 4/10 CVC inserted &CRRT initiated  4/12 PCCMre-consulted re-intubation 4/14 Extubated to BiPAP 4/15 Re-intubated due to respiratory distress 4/16 Tracheostomy 4/27 bronch with BAL 5/7 called back for hypercarbic failure. #6 cuffed placed. Right chest tube placed for enlarging  effusion. 5/15 remains on vent, CVVHD  Consults:  TCTS PCCM HF Nephrology  Procedures:  R cephalic double lumen PICC LIJ tunneled HD catheter L brachial arterial line 6-0 cuffed shiley  Significant Diagnostic Tests:  3/30 R/ LHC >>  Ost LM to Mid LM lesion is 35% stenosed.  Prox LAD lesion is 90% stenosed.  Prox LAD to Mid LAD lesion is 50% stenosed.  Mid Cx lesion is 100% stenosed.  Prox RCA to Mid RCA lesion is 90% stenosed.  RPDA lesion is 95% stenosed.  LV end diastolic pressure is severely elevated.  Hemodynamic findings consistent with moderate pulmonary hypertension. 1. Severe 3 vessel obstructive CAD 2. High LV filling pressures 3. Reduced cardiac output with index 2.3 4. Moderate pulmonary HTN.  3/30TTE >> 1. Left ventricular ejection fraction, by estimation, is 30 to 35%. The left ventricle has moderately decreased function. The left ventricle demonstrates regional wall motion abnormalities.There is mild left ventricular hypertrophy. Left ventricular diastolic function could not be evaluated. There is severe hypokinesis of the left ventricular, entire inferior wall, inferoseptal wall, apical segment and lateral wall.  2. Right ventricular systolic function is hyperdynamic. The right ventricular size is normal. There is moderately elevated pulmonary artery systolic pressure.  3. Decreased posterior leaflet motion due to ischemic tethering of the mitral valve leaflets.  4. The mitral valve is abnormal. Moderate to severe mitral valve regurgitation.  5. The aortic valve is tricuspid. Aortic valve regurgitation is not visualized. Mild aortic valve sclerosis is present, with no evidence of aortic valve stenosis.  6. The inferior vena cava is normal in size with greater than 50% respiratory variability, suggesting right atrial pressure of 3 mmHg.   4/1 CT chest w/o >>Large bilateral pleural effusions with associated atelectasis.Moderate to severe bilateral  ground-glass opacities, likely reflecting pneumonia and/or edema.  4/1 RHC >> RA = 18 RV = 60/20  PA = 63/29 (42) PCW = 33 Fick cardiac output/index = 3.7/1.9 PVR = 2.5 WU FA sat = 98% PA sat = 47%, 49% PaPi = 2.2   Micro Data:  5/7 R pleural fluid neg 5/7 blood neg to date 5/6 tracheal aspirate enterobacter  Antimicrobials:    Interim history/subjective:   No issues overnight.  Attempting trach collar trial today.  Still remains on ventilator at this time.  Still pulling negative with CVVHD  Objective   Blood pressure 109/63, pulse 91, temperature (!) 97.2 F (36.2 C), temperature source Axillary, resp. rate (!) 30, height _0  (1.803 m), weight 62.2 kg, SpO2 100 %. CVP:  [7 mmHg-9 mmHg] 7 mmHg  Vent Mode: PRVC FiO2 (%):  [40 %] 40 % Set Rate:  [30 bmp] 30 bmp Vt Set:  [560 mL] 560 mL PEEP:  [5 cmH20] 5 cmH20 Plateau Pressure:  [28 cmH20-30 cmH20] 30 cmH20   Intake/Output Summary (Last 24 hours) at 05/31/2019 1113 Last data filed at 05/31/2019 1100 Gross per 24 hour  Intake 6392.95 ml  Output 3348 ml  Net 3044.95 ml   Filed Weights   05/29/19 0500 05/30/19 0500 05/31/19 0600  Weight: 56.8 kg 58.2 kg 62.2 kg    Examination:   GEN: Elderly frail male tracheostomy tube in place on mechanical support HEENT: Tracheostomy tube in place CV: Regular rate rhythm S1-S2 PULM: Clear to auscultation, diminished in bases no crackles no wheeze GI: Soft, nontender nondistended EXT: Muscle wasting present NEURO: All 4 extremities on command PSYCH: RASS 0 SKIN: Multiple areas of bruising and skin breakdown  Glucose 134, Serum creatinine 1.14  Resolved Problems   Agitated delirium- improved - seroquel qHS, continue melatonin - continue PRN oxycodone - re-orient, encourage day/night cycles  Assessment & Plan:   Acute on chronic hypoxemic and hypercarbic respiratory failure after CABG now s/p trach. Multifactorial etiology.  Treated Enterobacter pneumonia  status post antibiotics -Pressure support trials during the day, trach collar trial today, vent at night -Chest tube management per primary pigtail in pleural space -Complete cefepime x7 days.  Shock-again multifactorial.  See discussion in 5/8 note. I think at this point it is just cardiogenic and renal vasoplegic. -Mixed etiology for shock -On and off pressors, midodrine at this time -Also undergoing CVVHD  ESRD  -CVVHD per nephrology  Constipation/colonic ileus - Received relistor 5/13, already on aggressive bowel regimen, add neostigmine and smog enema -Tube feeds restarted trickle at 20, increased to 25 today -We will get recs from RD  Dysphagia- -Likely need PEG tube at some point  Hypoglycemia, resolved -Restart of tube feeds we will continue to monitor with CBGs -Stop D5  Profound muscular deconditioning -We will need continued PT OT  Goals of care- continue current level of care with goal of CIR with iHD, palliative has established baseline   Best practice:  Diet: Tube feeds Pain/Anxiety/Delirium protocol (if indicated): see above VAP protocol (if indicated):HOB 30 degrees  DVT prophylaxis: ASA 376m GI prophylaxis: PPI Glucose control:SSI Mobility: BR Code Status: Full  Family Communication:per primary team Disposition:ICU pending CRRT and vent liberation  This patient is critically ill with multiple organ system failure; which, requires frequent high complexity decision making, assessment, support, evaluation, and titration of therapies. This was completed through the application of advanced monitoring technologies and extensive interpretation of multiple databases. During this encounter critical care time was devoted to patient care services described in this note for 32  minutes.  Garner Nash, DO [facility] 05/31/2019 11:14 AM

## 2019-05-31 NOTE — Progress Notes (Addendum)
   Daily Progress Note   Patient Name: Brendan [name]       Date: 05/31/2019 DOB: 08/25/1955  Age: 64 y.o. MRN#: [id number] Attending Physician: Ivin Poot, MD Primary Care Physician: [name], MD Admit Date: 04/13/2019  Reason for Consultation/Follow-up: Establishing goals of care  Subjective: Patient is non-vocal upon morning assessment. Per his bedside RN, Brendan [name] there have been no significant changes r/t chest tube, CRRT, or pressor therapy. It appears that dobutamine was added yesterday. Patient underwent a bronchoscopy this morning.   Patient wife, Brendan [name] called.. She vocalized feeling hopeful and up to date on the present health situation.   Questions answered and support given.   Length of Stay: 47 days  Vital Signs: BP 109/63   Pulse 89   Temp 98.8 F (37.1 C) (Oral)   Resp 20   Ht 5\' 11"  (1.803 m) Comment: measured x 3  Wt 62.2 kg   SpO2 100%   BMI 19.13 kg/m  SpO2: SpO2: 100 % O2 Device: O2 Device: Ventilator O2 Flow Rate: O2 Flow Rate (L/min): 10 L/min  Intake/output summary:   Intake/Output Summary (Last 24 hours) at 05/31/2019 1217 Last data filed at 05/31/2019 1200 Gross per 24 hour  Intake 6249.37 ml  Output 3117 ml  Net 3132.37 ml   LBM:   Baseline Weight: Weight: 68.2 kg Most recent weight: Weight: 62.2 kg  Physical Exam: -Alseep, ill-appearing, cachectic -RRR -tolerating trach collar, congested cough -follows commands, mood appropriate             Palliative Care Assessment & Plan  Code Status:  Full code Goals of Care:  Patients wife remains hopeful for stability/improvement with awareness of current condition/challenges and guarded prognosis.   PMT will continue to support patients family incrementally.   Prognosis: Guarded to Poor   Thank you for allowing the Palliative Medicine Team to assist in the care of this patient.  Time Total: 15 min.   Visit consisted of counseling and education dealing with the complex and  emotionally intense issues of symptom management and palliative care in the setting of serious and potentially life-threatening illness.Greater than 50%  of this time was spent counseling and coordinating care related to the above assessment and plan.  Tacey Ruiz, NP Palliative Medicine Team [phone]

## 2019-05-31 NOTE — Progress Notes (Addendum)
Patient ID: Brendan [name], male   DOB: [birth year], 64 y.o.   MRN: [id number]    S:  Remains on CRRT as well as pressors including dobutamine and norepinephrine. Dobutamine added yesterday. Continues to be positive daily. Nonconversant today  O:BP 114/66   Pulse 84   Temp (!) 97.2 F (36.2 C) (Axillary)   Resp 20   Ht 5' 11" (1.803 m) Comment: measured x 3  Wt 62.2 kg   SpO2 100%   BMI 19.13 kg/m   Intake/Output Summary (Last 24 hours) at 05/31/2019 1000 Last data filed at 05/31/2019 0800 Gross per 24 hour  Intake 6180.76 ml  Output 3164 ml  Net 3016.76 ml   Intake/Output: I/O last 3 completed shifts: In: 8994.7 [I.V.:6891; Blood:630; NG/GT:840; IV Piggyback:633.7] Out: 5638 [VFIEP:3295; Chest Tube:760]  Intake/Output this shift:  Total I/O In: 202 [I.V.:179.5; NG/GT:20; IV Piggyback:2.5] Out: 81 [Other:81] Weight change: 4 kg Gen: frail, thin, cachetic, alert Neck: lef IJ vascath (placed 5/3) CVS: normal rate Resp: decreased BS at bases, ventilated/trached Abd: +BS, soft, NT/ND Ext: Trace bilateral lower extremity edema at the ankle- atrophied legs   Recent Labs  Lab 05/27/19 0530 05/27/19 0814 05/28/19 0417 05/28/19 0417 05/28/19 1656 05/29/19 0501 05/29/19 0855 05/29/19 1612 05/30/19 0445 05/30/19 0451 05/30/19 1606 05/31/19 0439 05/31/19 0453  NA 137   < > 136   < > 137   < > 137 140 137 137 137 139 137  K 4.5   < > 4.9   < > 4.8   < > 5.1 4.8 4.6 4.6 4.2 4.6 4.8  CL 102   < > 102  --  102  --  102 105  --  105 103  --  104  CO2 24   < > 24  --  26  --  27 27  --  24 25  --  24  GLUCOSE 130*   < > 98  --  99  --  88 153*  --  49* 160*  --  91  BUN 28*   < > 25*  --  26*  --  20 17  --  12 12  --  12  CREATININE 1.15   < > 0.90  --  1.04  --  0.99 0.93  --  0.77 0.75  --  0.81  ALBUMIN 3.2*  3.2*   < > 3.1*  --  3.4*  --  3.3* 3.2*  --  3.1* 3.1*  --  2.9*  CALCIUM 9.1   < > 8.7*  --  8.6*  --  8.5* 8.5*  --  8.3* 8.0*  --  8.2*  PHOS 1.3*   < > 2.5   --  3.0  --  2.5 2.2*  --  1.5* 6.5*  --  2.0*  AST 29  --   --   --   --   --   --   --   --   --   --   --   --   ALT 15  --   --   --   --   --   --   --   --   --   --   --   --    < > = values in this interval not displayed.   Liver Function Tests: Recent Labs  Lab 05/27/19 0530 05/27/19 1550 05/30/19 0451 05/30/19 1606 05/31/19 0453  AST 29  --   --   --   --  ALT 15  --   --   --   --   ALKPHOS 294*  --   --   --   --   BILITOT 0.9  --   --   --   --   PROT 7.1  --   --   --   --   ALBUMIN 3.2*  3.2*   < > 3.1* 3.1* 2.9*   < > = values in this interval not displayed.   No results for input(s): LIPASE, AMYLASE in the last 168 hours. No results for input(s): AMMONIA in the last 168 hours. CBC: Recent Labs  Lab 05/28/19 0417 05/29/19 0501 05/29/19 0855 05/30/19 0445 05/30/19 0451 05/30/19 0451 05/30/19 1606 05/31/19 0439 05/31/19 0453  WBC 6.8  --  9.6  --  8.1  --  6.3  --  8.2  HGB 8.9*   < > 7.6*   < > 6.9*   < > 7.7* 8.8* 8.4*  HCT 28.7*   < > 25.0*   < > 22.1*   < > 24.7* 26.0* 26.8*  MCV 98.0  --  100.8*  --  99.5  --  99.6  --  94.4  PLT 170  --  132*  --  PLATELET CLUMPS NOTED ON SMEAR, UNABLE TO ESTIMATE  --  PLATELET CLUMPS NOTED ON SMEAR, UNABLE TO ESTIMATE  --  104*   < > = values in this interval not displayed.   Cardiac Enzymes: No results for input(s): CKTOTAL, CKMB, CKMBINDEX, TROPONINI in the last 168 hours. CBG: Recent Labs  Lab 05/30/19 1608 05/30/19 2027 05/31/19 0003 05/31/19 0435 05/31/19 0802  GLUCAP 132* 109* 114* 92 85    Iron Studies: No results for input(s): IRON, TIBC, TRANSFERRIN, FERRITIN in the last 72 hours. Studies/Results: DG Chest Port 1 View  Result Date: 05/30/2019 CLINICAL DATA:  Status post cardiac surgery. EXAM: PORTABLE CHEST 1 VIEW COMPARISON:  Chest x-ray from yesterday. FINDINGS: Unchanged tracheostomy and feeding tubes. Unchanged tunneled left internal jugular dialysis catheter. Unchanged right upper  extremity PICC line and right chest tube. Stable cardiomegaly status post CABG and mitral valve repair. Diffuse interstitial and patchy airspace opacities have mildly worsened in the left upper lobe and right lung base. No pneumothorax or pleural effusion. No acute osseous abnormality. IMPRESSION: 1. Extensive bilateral interstitial and airspace disease, worsened in the left upper lobe and right lung base. Electronically Signed   By: William T Derry M.D.   On: 05/30/2019 07:48   DG Abd Portable 1V  Result Date: 05/30/2019 CLINICAL DATA:  Constipation. EXAM: PORTABLE ABDOMEN - 1 VIEW COMPARISON:  Abdominal x-ray dated May 24, 2019. FINDINGS: Feeding tube tip near the pylorus. Progressive moderate to large colonic stool burden with new mild distention of the transverse colon. Prominent nondilated air-filled loops of small bowel in the left lower quadrant suggestive of ileus. No radio-opaque calculi or other significant radiographic abnormality are seen. Prior cholecystectomy. No acute osseous abnormality. Unchanged bibasilar airspace disease. IMPRESSION: Progressive moderate to large colonic stool burden with increased mild distention of the transverse colon. Electronically Signed   By: William T Derry M.D.   On: 05/30/2019 10:17   ECHOCARDIOGRAM LIMITED  Result Date: 05/29/2019    ECHOCARDIOGRAM LIMITED REPORT   Patient Name:   Brendan [name] Date of Exam: 05/29/2019 Medical Rec #:  [id number]        Height:       71.0 in Accession #:    [id number]         Weight:       125.2 lb Date of Birth:  05/12/1955        BSA:          1.728 m Patient Age:    63 years         BP:           101/40 mmHg Patient Gender: M                HR:           73 bpm. Exam Location:  Inpatient Procedure: Limited Echo, Limited Color Doppler and Cardiac Doppler Indications:    ischemic cardiomyopathy 414.8  History:        Patient has prior history of Echocardiogram examinations, most                 recent 05/08/2019. Prior CABG.                   Mitral Valve: valve is present in the mitral position.  Sonographer:    Lauren Pennington Referring Phys: [id number] PETER VAN TRIGT IMPRESSIONS  1. Left ventricular ejection fraction, by estimation, is 35 to 40%. The left ventricle has moderately decreased function. The left ventricle demonstrates regional wall motion abnormalities (see scoring diagram/findings for description). Left ventricular  diastolic function could not be evaluated.  2. The right ventricular size is normal.  3. The mitral valve has been repaired/replaced. No evidence of mitral valve regurgitation. No evidence of mitral stenosis. There is a present in the mitral position.  4. The aortic valve is tricuspid. Aortic valve regurgitation is not visualized. Mild aortic valve sclerosis is present, with no evidence of aortic valve stenosis.  5. The inferior vena cava is normal in size with <50% respiratory variability, suggesting right atrial pressure of 8 mmHg. FINDINGS  Left Ventricle: Left ventricular ejection fraction, by estimation, is 35 to 40%. The left ventricle has moderately decreased function. The left ventricle demonstrates regional wall motion abnormalities. There is no left ventricular hypertrophy. Right Ventricle: The right ventricular size is normal. Left Atrium: Left atrial size was not well visualized. Right Atrium: Right atrial size was not well visualized. Pericardium: Trivial pericardial effusion is present. Mitral Valve: The mitral valve has been repaired/replaced. There is moderate thickening of the mitral valve leaflet(s). There is moderate calcification of the mitral valve leaflet(s). There is a present in the mitral position. No evidence of mitral valve  stenosis. Tricuspid Valve: The tricuspid valve is normal in structure. Tricuspid valve regurgitation is trivial. No evidence of tricuspid stenosis. Aortic Valve: The aortic valve is tricuspid. . There is mild thickening and mild calcification of the aortic valve. Aortic  valve regurgitation is not visualized. Mild aortic valve sclerosis is present, with no evidence of aortic valve stenosis. There is mild thickening of the aortic valve. There is mild calcification of the aortic valve. Pulmonic Valve: The pulmonic valve was grossly normal. Pulmonic valve regurgitation is trivial. No evidence of pulmonic stenosis. Aorta: The aortic root was not well visualized. Venous: The inferior vena cava is normal in size with less than 50% respiratory variability, suggesting right atrial pressure of 8 mmHg. IAS/Shunts: The interatrial septum was not assessed.  LEFT VENTRICLE PLAX 2D LVIDd:         5.50 cm LVIDs:         4.50 cm LV PW:         1.00 cm LV IVS:        1.00 cm   LVOT diam:     1.80 cm LV SV:         45 LV SV Index:   26 LVOT Area:     2.54 cm  LV Volumes (MOD) LV vol d, MOD A2C: 72.8 ml LV vol s, MOD A2C: 46.3 ml LV SV MOD A2C:     26.5 ml LEFT ATRIUM         Index LA diam:    3.90 cm 2.26 cm/m  AORTIC VALVE LVOT Vmax:   85.50 cm/s LVOT Vmean:  53.500 cm/s LVOT VTI:    0.178 m  AORTA Ao Root diam: 3.30 cm MITRAL VALVE MV Area (PHT): 3.65 cm     SHUNTS MV Decel Time: 208 msec     Systemic VTI:  0.18 m MV E velocity: 110.00 cm/s  Systemic Diam: 1.80 cm MV A velocity: 81.20 cm/s MV E/A ratio:  1.35 Buford Dresser MD Electronically signed by Buford Dresser MD Signature Date/Time: 05/29/2019/5:23:42 PM    Final    . aspirin EC  325 mg Oral Daily   Or  . aspirin  324 mg Per Tube Daily  . B-complex with vitamin C  1 tablet Per Tube Daily  . chlorhexidine gluconate (MEDLINE KIT)  15 mL Mouth Rinse BID  . Chlorhexidine Gluconate Cloth  6 each Topical Q0600  . citalopram  10 mg Per Tube Daily  . darbepoetin (ARANESP) injection - NON-DIALYSIS  100 mcg Subcutaneous Q Tue-1800  . docusate  200 mg Per Tube Daily  . feeding supplement (PRO-STAT SUGAR FREE 64)  30 mL Per Tube BID  . gabapentin  100 mg Oral Q8H  . insulin aspart  0-6 Units Subcutaneous Q4H  . mouth rinse   15 mL Mouth Rinse 10 times per day  . melatonin  3 mg Per Tube QHS  . metoCLOPramide (REGLAN) injection  10 mg Intravenous Q6H  . midodrine  20 mg Per Tube Q8H  . neomycin-polymyxin-hydrocortisone  3 drop Left EAR Q12H  . neostigmine  0.25 mg Subcutaneous Q6H  . pantoprazole sodium  40 mg Per Tube BID  . polyethylene glycol  17 g Oral Daily  . QUEtiapine  25 mg Oral QHS  . rOPINIRole  0.5 mg Oral QHS  . sennosides  5 mL Per Tube BID  . sodium chloride flush  10-40 mL Intracatheter Q12H    BMET    Component Value Date/Time   NA 137 05/31/2019 0453   K 4.8 05/31/2019 0453   CL 104 05/31/2019 0453   CO2 24 05/31/2019 0453   GLUCOSE 91 05/31/2019 0453   BUN 12 05/31/2019 0453   CREATININE 0.81 05/31/2019 0453   CALCIUM 8.2 (L) 05/31/2019 0453   GFRNONAA >60 05/31/2019 0453   GFRAA >60 05/31/2019 0453   CBC    Component Value Date/Time   WBC 8.2 05/31/2019 0453   RBC 2.84 (L) 05/31/2019 0453   HGB 8.4 (L) 05/31/2019 0453   HCT 26.8 (L) 05/31/2019 0453   PLT 104 (L) 05/31/2019 0453   MCV 94.4 05/31/2019 0453   MCH 29.6 05/31/2019 0453   MCHC 31.3 05/31/2019 0453   RDW 18.8 (H) 05/31/2019 0453   LYMPHSABS 1.1 04/30/2019 0402   MONOABS 1.1 (H) 04/29/2019 0402   EOSABS 0.2 04/24/2019 0402   BASOSABS 0.0 04/20/2019 0402    Brief HPI: admitted to outside hospital on 3/26 with SOB, new low EF and NSTEMI txd to [facility] 3/29: lhc 3 vessel CAD s/p CABG and MVR complicated by  cardiogenic shock, IABP, Impella, and worsening volume status/respiratory status and started on CRRT 04/26/19. Admission Scr was 0.99.  Assessment/Plan:  1. Acute hypoxic respiratory failure on 05/23/19- on vent via trach.  S/p right chest tube. Ventilation status unchanged. 2. Oliguric/anuricAKI- CRRT started on 4/10 and stopped 4/29 w/ attempted transition to IHD on 05/17/19 but failed and now back on CRRT 1. Remains anuric. 2. Resumed CRRT on 05/23/19 following acute worsening of his oxygenation and hypotension.   3. Dialysate 4K/2.5 Ca for replacement fluids and dialysate.  no heparin.   4. Running positive for 2 days. Continues to ventilate easily. Pressor requirement has not improved. Some edema on exam so we'll try to run even for now. 5. Excellent clearance on current CRRT. Given hypophosphatemia will back down to DFR of 1000 from 1500 6. Will keep on CRRT as long as no UOP and req pressors 3. Vascular access- leftIJ tunneled HD catheter placed by IR on 05/19/19- now 11 days old- hopefully replace to tunneled cath next week; ideally would have off pressors 4. Anemia of CKD, multifactorial- dropped today below 7. Likely needs transfusion per primary team today. Darbo 100mcg started 5/11. CTM and consider uptitrating as needed 5. CAD- 3 vessel with EF 25% s/p CABG and MVR 05/14/2019 complicated by cardiogenic shock as below 6. Acute systolic CHF and cardiogenic shock- still on pressors. Volume improved with CRRT/IHD.  On pretty max midodrine as well, unlike to benefit from higher dose. 7. Elytes- Hypophosphatemia today and will replace with Naphos 20mmol today. Monitor phos daily. K acceptable today. Decreasing DFR as above 8. MR s/p MVR 9. Severe debility- will likely need LTAC if survives. GOC meeting possibly in the works   J   [facility] [phone]  

## 2019-06-01 ENCOUNTER — Inpatient Hospital Stay (HOSPITAL_COMMUNITY): Payer: Medicaid Other

## 2019-06-01 LAB — RENAL FUNCTION PANEL
Albumin: 2.6 g/dL — ABNORMAL LOW (ref 3.5–5.0)
Albumin: 2.6 g/dL — ABNORMAL LOW (ref 3.5–5.0)
Anion gap: 6 (ref 5–15)
Anion gap: 6 (ref 5–15)
BUN: 12 mg/dL (ref 8–23)
BUN: 10 mg/dL (ref 8–23)
CO2: 25 mmol/L (ref 22–32)
CO2: 26 mmol/L (ref 22–32)
Calcium: 8 mg/dL — ABNORMAL LOW (ref 8.9–10.3)
Calcium: 8.3 mg/dL — ABNORMAL LOW (ref 8.9–10.3)
Chloride: 105 mmol/L (ref 98–111)
Chloride: 104 mmol/L (ref 98–111)
Creatinine, Ser: 1.14 mg/dL (ref 0.61–1.24)
Creatinine, Ser: 1.13 mg/dL (ref 0.61–1.24)
GFR calc Af Amer: >60 mL/min (ref >60)
GFR calc Af Amer: >60 mL/min (ref >60)
GFR calc non Af Amer: >60 mL/min (ref >60)
GFR calc non Af Amer: >60 mL/min (ref >60)
Glucose, Bld: 119 mg/dL — ABNORMAL HIGH (ref 70–99)
Glucose, Bld: 123 mg/dL — ABNORMAL HIGH (ref 70–99)
Phosphorus: 1.9 mg/dL — ABNORMAL LOW (ref 2.5–4.6)
Phosphorus: 3.8 mg/dL (ref 2.5–4.6)
Potassium: 4.4 mmol/L (ref 3.5–5.1)
Potassium: 4.4 mmol/L (ref 3.5–5.1)
Sodium: 136 mmol/L (ref 135–145)
Sodium: 136 mmol/L (ref 135–145)

## 2019-06-01 LAB — POCT I-STAT 7, (LYTES, BLD GAS, ICA,H+H)
Acid-Base Excess: 7 mmol/L — ABNORMAL HIGH (ref 0.0–2.0)
Acid-Base Excess: 5 mmol/L — ABNORMAL HIGH (ref 0.0–2.0)
Bicarbonate: 30.3 mmol/L — ABNORMAL HIGH (ref 20.0–28.0)
Bicarbonate: 28.7 mmol/L — ABNORMAL HIGH (ref 20.0–28.0)
Calcium, Ion: 1.19 mmol/L (ref 1.15–1.40)
Calcium, Ion: 1.2 mmol/L (ref 1.15–1.40)
HCT: 25 % — ABNORMAL LOW (ref 39.0–52.0)
HCT: 23 % — ABNORMAL LOW (ref 39.0–52.0)
Hemoglobin: 8.5 g/dL — ABNORMAL LOW (ref 13.0–17.0)
Hemoglobin: 7.8 g/dL — ABNORMAL LOW (ref 13.0–17.0)
O2 Saturation: 99 %
O2 Saturation: 99 %
Patient temperature: 98.3
Patient temperature: 97.9
Potassium: 4.6 mmol/L (ref 3.5–5.1)
Potassium: 4.6 mmol/L (ref 3.5–5.1)
Sodium: 140 mmol/L (ref 135–145)
Sodium: 137 mmol/L (ref 135–145)
TCO2: 31 mmol/L (ref 22–32)
TCO2: 30 mmol/L (ref 22–32)
pCO2 arterial: 37.4 mmHg (ref 32.0–48.0)
pCO2 arterial: 37.1 mmHg (ref 32.0–48.0)
pH, Arterial: 7.516 — ABNORMAL HIGH (ref 7.350–7.450)
pH, Arterial: 7.495 — ABNORMAL HIGH (ref 7.350–7.450)
pO2, Arterial: 135 mmHg — ABNORMAL HIGH (ref 83.0–108.0)
pO2, Arterial: 130 mmHg — ABNORMAL HIGH (ref 83.0–108.0)

## 2019-06-01 LAB — CBC
HCT: 25.3 % — ABNORMAL LOW (ref 39.0–52.0)
Hemoglobin: 8.1 g/dL — ABNORMAL LOW (ref 13.0–17.0)
MCH: 30.5 pg (ref 26.0–34.0)
MCHC: 32 g/dL (ref 30.0–36.0)
MCV: 95.1 fL (ref 80.0–100.0)
Platelets: 104 10*3/uL — ABNORMAL LOW (ref 150–400)
RBC: 2.66 MIL/uL — ABNORMAL LOW (ref 4.22–5.81)
RDW: 18 % — ABNORMAL HIGH (ref 11.5–15.5)
WBC: 6.2 10*3/uL (ref 4.0–10.5)
nRBC: 0 % (ref 0.0–0.2)

## 2019-06-01 LAB — GLUCOSE, CAPILLARY
Glucose-Capillary: 116 mg/dL — ABNORMAL HIGH (ref 70–99)
Glucose-Capillary: 126 mg/dL — ABNORMAL HIGH (ref 70–99)
Glucose-Capillary: 108 mg/dL — ABNORMAL HIGH (ref 70–99)
Glucose-Capillary: 130 mg/dL — ABNORMAL HIGH (ref 70–99)
Glucose-Capillary: 120 mg/dL — ABNORMAL HIGH (ref 70–99)
Glucose-Capillary: 144 mg/dL — ABNORMAL HIGH (ref 70–99)

## 2019-06-01 LAB — MAGNESIUM: Magnesium: 2.4 mg/dL (ref 1.7–2.4)

## 2019-06-01 LAB — COOXEMETRY PANEL
Carboxyhemoglobin: 2 % — ABNORMAL HIGH (ref 0.5–1.5)
Methemoglobin: 1.1 % (ref 0.0–1.5)
O2 Saturation: 67.1 %
Total hemoglobin: 8.5 g/dL — ABNORMAL LOW (ref 12.0–16.0)

## 2019-06-01 MED ORDER — SODIUM PHOSPHATES 45 MMOLE/15ML IV SOLN
30.0000 mmol | Freq: Once | INTRAVENOUS | Status: AC
  Administered 2019-06-01: 30 mmol via INTRAVENOUS
  Filled 2019-06-01: qty 10

## 2019-06-01 MED ORDER — COLLAGENASE 250 UNIT/GM EX OINT
TOPICAL_OINTMENT | Freq: Every day | CUTANEOUS | Status: DC
  Administered 2019-06-05 – 2019-06-12 (×3): 1 via TOPICAL
  Filled 2019-06-01: qty 30

## 2019-06-01 MED ORDER — VITAL 1.5 CAL PO LIQD
1000.0000 mL | ORAL | Status: DC
  Administered 2019-06-02: 1000 mL
  Filled 2019-06-01 (×2): qty 1000

## 2019-06-01 MED ORDER — OXYCODONE HCL 20 MG/ML PO CONC
5.0000 mg | ORAL | Status: DC | PRN
  Administered 2019-06-01 – 2019-06-03 (×13): 5 mg
  Filled 2019-06-01 (×14): qty 1

## 2019-06-01 NOTE — Progress Notes (Signed)
[facility] Progress Note Patient Name: Brendan [name] DOB: 1955-01-20 MRN: [id number]   Date of Service  06/01/2019  HPI/Events of Note  ABG on 40%/PRVC 30/TV 560/P 5 = 7.516/37.4/135.   eICU Interventions  Will order: 1. Decrease PRVC rate to 25. 2. Repeat ABG at 7:30 AM.  3. Nursing to contact Nephrology about [facility] in dialysis fluid.     Intervention Category Major Interventions: Respiratory failure - evaluation and management;Acid-Base disturbance - evaluation and management  Sommer,Steven Eugene 06/01/2019, 5:03 AM

## 2019-06-01 NOTE — Progress Notes (Signed)
[address]       [place] [address]             [phone]        CARDIOTHORACIC SURGERY PROGRESS NOTE   R30 Days Post-Op Procedure(s) (LRB): VIDEO BRONCHOSCOPY USING DISPOSABLE ANESTHESIA SCOPE (N/A) TRACHEOSTOMY (N/A)  Subjective: More alert today  Objective: Vital signs: BP Readings from Last 1 Encounters:  06/01/19 (!) 124/49   Pulse Readings from Last 1 Encounters:  06/01/19 75   Resp Readings from Last 1 Encounters:  06/01/19 (!) 25   Temp Readings from Last 1 Encounters:  06/01/19 97.7 F (36.5 C) (Oral)    Hemodynamics: CVP:  [3 mmHg-8 mmHg] 6 mmHg  Mixed venous co-ox 67%   Physical Exam:  Rhythm:   NSR  Breath sounds: shallow  Heart sounds:  RRR  Incisions:  Clean and dry  Abdomen:  Soft, non-distended, non-tender, tolerating low volume tube feeds  Extremities:  Warm, adequately-perfused  Chest tubes:  decreasing volume thin serosanguinous output, no air leak    Intake/Output from previous day: 05/15 0701 - 05/16 0700 In: 2081.6 [I.V.:1024.5; NG/GT:650; IV Piggyback:257.1] Out: 1751 [Emesis/NG output:50; Chest Tube:150] Intake/Output this shift: Total I/O In: 183.1 [I.V.:9.2; NG/GT:60; IV Piggyback:113.9] Out: 191 [Other:191]  Lab Results:  CBC: Recent Labs    05/31/19 0453 05/31/19 0453 06/01/19 0417 06/01/19 0417 06/01/19 0419 06/01/19 0707  WBC 8.2  --  6.2  --   --   --   HGB 8.4*   < > 8.1*   < > 8.5* 7.8*  HCT 26.8*   < > 25.3*   < > 25.0* 23.0*  PLT 104*  --  104*  --   --   --    < > = values in this interval not displayed.    BMET:  Recent Labs    05/31/19 1522 05/31/19 1522 06/01/19 0417 06/01/19 0417 06/01/19 0419 06/01/19 0707  NA 139   < > 136   < > 140 137  K 4.7   < > 4.4   < > 4.6 4.6  CL 105  --  105  --   --   --   CO2 23  --  25  --   --   --   GLUCOSE 134*  --  119*  --   --   --   BUN 13  --  12  --   --   --   CREATININE 1.14  --  1.14  --   --   --   CALCIUM 8.1*  --   8.0*  --   --   --    < > = values in this interval not displayed.     PT/INR:  No results for input(s): LABPROT, INR in the last 72 hours.  CBG (last 3)  Recent Labs    05/31/19 2351 06/01/19 0335 06/01/19 0742  GLUCAP 126* 116* 108*    ABG    Component Value Date/Time   PHART 7.495 (H) 06/01/2019 0707   PCO2ART 37.1 06/01/2019 0707   PO2ART 130 (H) 06/01/2019 0707   HCO3 28.7 (H) 06/01/2019 0707   TCO2 30 06/01/2019 0707   ACIDBASEDEF 3.0 (H) 04/28/2019 0735   O2SAT 99.0 06/01/2019 0707    CXR: PORTABLE CHEST 1 VIEW  COMPARISON:  Chest x-rays dated 05/30/2019 and 05/29/2019.  FINDINGS: Stable cardiomegaly. Tubes and lines are stable in position, including a RIGHT-sided chest tube. No pleural  effusion or pneumothorax is seen. Diffuse bilateral airspace opacities are unchanged.  IMPRESSION: 1. Stable chest x-ray. 2. Stable diffuse bilateral airspace opacities, pulmonary edema versus multifocal pneumonia.   Electronically Signed   By: Franki Cabot M.D.   On: 06/01/2019 09:37   Assessment/Plan: S/P Procedure(s) (LRB): VIDEO BRONCHOSCOPY USING DISPOSABLE ANESTHESIA SCOPE (N/A) TRACHEOSTOMY (N/A)  Essentially stable but remains on dobutamine, CVP low and co-ox67% Currently maintaining NSR VDRF s/p trach Right pleural effusion s/p chest tube - still draining serous fluid but volume decreased ARF tolerating CRRT, keeping volume even Anemia stable Severely deconditioned and malnourished   Continue current plan   Rexene Alberts, MD 06/01/2019 11:15 AM

## 2019-06-01 NOTE — Progress Notes (Signed)
NAME:  Brendan [name], MRN:  [id number], DOB:  Apr 24, 1955, LOS: 24 ADMISSION DATE:  03/21/2019, CONSULTATION DATE:  04/17/19 REFERRING MD:  Dr. Haroldine Laws, CHIEF COMPLAINT:  Respiratory distress   Brief History   77 yoM originally presented with SOB and fatigue at [facility] found to have new HFrEF, NSTEMI, and bilateral pleural effusions transferred to [facility] on 3/29 for further cardiac evaluation. 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 Started on lasix and milrinone gtts however developed NSVT. Was taken 4/1 for placement of IABP and swan for optimization prior to CABG. Was on precedex for agitation/ confusion, some concern for DTs. On the evening of 4/1, patient developed worsening respiratory distress and hypoxia, PCCM consulted for intubation and vent management.  Patient was extubated on 4/14 and developed worsening hypoxia on BiPAP. Overnight he developed pulseless VT requiring 1 minute of CPR and epinephrine to achieve ROSC. Neurovascularly intact afterward. Subsequently had increased work of breathing and worsening respiratory distress requiring intubation. Patient had tracheostomy performed on 04/20/2019. Trying to wean off ventilator.   Past Medical History  Tobacco abuse, HTN, poorly controlled diabetes, diabetic neuropathy  Significant Hospital Events   3/30 Lt heart cath/TEE performed 4/1 Intubated, RHC/IABP/PA cath 4/2 Impella placement 4/3 febrile over evening and night. Blood cultures obtained. Vanc/cefepime started. Hematuria--heparin held. 4/4 Extubated,abx transitioned to rocephin. Heparin restarted 4/6 Re-intubated for CABG/MVR 4/7 Extubated from CABG to BiPAP 4/9 Impella removed  4/9 TEE 4/10 CVC inserted &CRRT initiated  4/12 PCCMre-consulted re-intubation 4/14 Extubated to BiPAP 4/15 Re-intubated due to respiratory distress 4/16 Tracheostomy 4/27 bronch with BAL 5/7 called back for hypercarbic failure. #6 cuffed placed. Right chest tube placed for enlarging  effusion. 5/15-16 remains on vent, CVVHD  Consults:  TCTS PCCM HF Nephrology  Procedures:  R cephalic double lumen PICC LIJ tunneled HD catheter L brachial arterial line 6-0 cuffed shiley  Significant Diagnostic Tests:  3/30 R/ LHC >>  Ost LM to Mid LM lesion is 35% stenosed.  Prox LAD lesion is 90% stenosed.  Prox LAD to Mid LAD lesion is 50% stenosed.  Mid Cx lesion is 100% stenosed.  Prox RCA to Mid RCA lesion is 90% stenosed.  RPDA lesion is 95% stenosed.  LV end diastolic pressure is severely elevated.  Hemodynamic findings consistent with moderate pulmonary hypertension. 1. Severe 3 vessel obstructive CAD 2. High LV filling pressures 3. Reduced cardiac output with index 2.3 4. Moderate pulmonary HTN.  3/30TTE >> 1. Left ventricular ejection fraction, by estimation, is 30 to 35%. The left ventricle has moderately decreased function. The left ventricle demonstrates regional wall motion abnormalities.There is mild left ventricular hypertrophy. Left ventricular diastolic function could not be evaluated. There is severe hypokinesis of the left ventricular, entire inferior wall, inferoseptal wall, apical segment and lateral wall.  2. Right ventricular systolic function is hyperdynamic. The right ventricular size is normal. There is moderately elevated pulmonary artery systolic pressure.  3. Decreased posterior leaflet motion due to ischemic tethering of the mitral valve leaflets.  4. The mitral valve is abnormal. Moderate to severe mitral valve regurgitation.  5. The aortic valve is tricuspid. Aortic valve regurgitation is not visualized. Mild aortic valve sclerosis is present, with no evidence of aortic valve stenosis.  6. The inferior vena cava is normal in size with greater than 50% respiratory variability, suggesting right atrial pressure of 3 mmHg.   4/1 CT chest w/o >>Large bilateral pleural effusions with associated atelectasis.Moderate to severe  bilateral ground-glass opacities, likely reflecting pneumonia and/or edema.  4/1 RHC >> RA = 18 RV = 60/20  PA = 63/29 (42) PCW = 33 Fick cardiac output/index = 3.7/1.9 PVR = 2.5 WU FA sat = 98% PA sat = 47%, 49% PaPi = 2.2   Micro Data:  5/7 R pleural fluid neg 5/7 blood neg to date 5/6 tracheal aspirate enterobacter  Antimicrobials:    Interim history/subjective:   No issues overnight.  We will again attempt trach collar trial. Remains on vent support.  Objective   Blood pressure (!) 124/49, pulse 83, temperature 97.7 F (36.5 C), temperature source Oral, resp. rate (!) 25, height _0  (1.803 m), weight 63 kg, SpO2 100 %. CVP:  [3 mmHg-8 mmHg] 6 mmHg  Vent Mode: PRVC FiO2 (%):  [40 %] 40 % Set Rate:  [25 bmp-30 bmp] 25 bmp Vt Set:  [560 mL] 560 mL PEEP:  [5 cmH20] 5 cmH20 Plateau Pressure:  [24 cmH20-28 cmH20] 25 cmH20   Intake/Output Summary (Last 24 hours) at 06/01/2019 0910 Last data filed at 06/01/2019 0800 Gross per 24 hour  Intake 1670.43 ml  Output 1675 ml  Net -4.57 ml   Filed Weights   05/30/19 0500 05/31/19 0600 06/01/19 0600  Weight: 58.2 kg 62.2 kg 63 kg    Examination:   GEN: Frail elderly male, tracheostomy tube in place on mechanical life support HEENT: Trach in place CDI CV: Regular rate rhythm S1-S2 PULM: Clear to auscultation bilaterally no crackles no wheeze GI: Soft, nontender nondistended EXT: Severe muscle wasting present NEURO: Moves all 4 extremities follows commands PSYCH: RASS of 0 SKIN: Multiple areas of bruising and skin breakdown, scabbing  Labs reviewed Hemoglobin 7.8.  Resolved Problems   Agitated delirium- improved - seroquel qHS, continue melatonin - continue PRN oxycodone - re-orient, encourage day/night cycles  Assessment & Plan:   Acute on chronic hypoxemic and hypercarbic respiratory failure after CABG now s/p trach. Multifactorial etiology.  Treated Enterobacter pneumonia status post  antibiotics -Continue pressure support trials as tolerated. -Possible trach collar trial today. -Ventilator at night. -Continue chest tube management per primary -Completed 7 days of cefepime.  Shock-again multifactorial.  See discussion in 5/8 note. I think at this point it is just cardiogenic and renal vasoplegic. -Mixed etiology for his shock. On and off pressors, Continue weaning to maintain mean arterial pressure greater than 65. -Dobutamine per cardiology services.  And advanced heart failure  ESRD  -CVVHD per nephrology  Constipation/colonic ileus - Received relistor 5/13, already on aggressive bowel regimen, add neostigmine and smog enema -Tube feeds at trickle rate 20 -Appreciate RD recommendations.  Dysphagia- -Likely need PEG tube at some point  Hypoglycemia, resolved Restarted tube feeds and continue to monitor CBGs off of D5.  Profound muscular deconditioning -Continued PT OT  Goals of care-continuing current level of care  Best practice:  Diet: Tube feeds Pain/Anxiety/Delirium protocol (if indicated): see above VAP protocol (if indicated):HOB 30 degrees  DVT prophylaxis: ASA 374m GI prophylaxis: PPI Glucose control:SSI Mobility: BR Code Status: Full  Family Communication:per primary team Disposition:ICU pending CRRT and vent liberation  This patient is critically ill with multiple organ system failure; which, requires frequent high complexity decision making, assessment, support, evaluation, and titration of therapies. This was completed through the application of advanced monitoring technologies and extensive interpretation of multiple databases. During this encounter critical care time was devoted to patient care services described in this note for 32 minutes.  BGarner Nash DO [facility] 06/01/2019 9:10 AM

## 2019-06-01 NOTE — Progress Notes (Signed)
RT note: attempted patient CPAP/PSV of 15/5 however patient's VE was low.  Placed back on full support settings and currently tolerating well.  Will continue to monitor.

## 2019-06-01 NOTE — Progress Notes (Signed)
Patient ID: Brendan [name], male   DOB: Nov 21, 1955, 64 y.o.   MRN: [id number]     Advanced Heart Failure Rounding Note  PCP-Cardiologist: No primary care provider on file.   Subjective:    Remains on vent and CRRT  Dobutamine added for low co-ox 39% and inability to wean NE on 5/14.   Co-ox 67% this am. NE down to 3  Remains lethargic but will awaken.   CVP 2-3.   Objective:   Weight Range: 62.2 kg Body mass index is 19.13 kg/m.   Vital Signs:   Temp:  [97.2 F (36.2 C)-98.3 F (36.8 C)] 98.3 F (36.8 C) (05/16 0326) Pulse Rate:  [78-91] 81 (05/16 0430) Resp:  [6-30] 30 (05/16 0430) BP: (109-142)/(57-67) 110/57 (05/16 0000) SpO2:  [97 %-100 %] 100 % (05/16 0430) Arterial Line BP: (76-142)/(38-65) 114/48 (05/16 0430) FiO2 (%):  [40 %] 40 % (05/16 0326) Weight:  [62.2 kg] 62.2 kg (05/15 0600) Last BM Date: 05/31/19  Weight change: Filed Weights   05/29/19 0500 05/30/19 0500 05/31/19 0600  Weight: 56.8 kg 58.2 kg 62.2 kg    Intake/Output:   Intake/Output Summary (Last 24 hours) at 06/01/2019 0509 Last data filed at 06/01/2019 0500 Gross per 24 hour  Intake 2413.2 ml  Output 1824 ml  Net 589.2 ml      Physical Exam   General:  Thin cachetic. On vent through trach.  Lethargic but arouses HEENT: normal + temporal wasting + [place] Neck: supple. no JVD. Carotids 2+ bilat; no bruits. No lymphadenopathy or thryomegaly appreciated. Cor: PMI nondisplaced. Regular rate & rhythm. No rubs, gallops or murmurs. Lungs: coarse Abdomen: soft, nontender, nondistended. No hepatosplenomegaly. No bruits or masses. Good bowel sounds. Extremities: no cyanosis, clubbing, rash, edema Neuro: lethargic but awakens. Nonfocal  Telemetry   NSR 70-80s Personally reviewed   Labs    CBC Recent Labs    05/31/19 0453 05/31/19 0453 06/01/19 0417 06/01/19 0419  WBC 8.2  --  6.2  --   HGB 8.4*   < > 8.1* 8.5*  HCT 26.8*   < > 25.3* 25.0*  MCV 94.4  --  95.1  --   PLT 104*   --  104*  --    < > = values in this interval not displayed.   Basic Metabolic Panel Recent Labs    05/30/19 0451 05/30/19 1606 05/31/19 0453 05/31/19 0453 05/31/19 1522 05/31/19 1522 06/01/19 0417 06/01/19 0419  NA 137   < > 137   < > 139   < > 136 140  K 4.6   < > 4.8   < > 4.7   < > 4.4 4.6  CL 105   < > 104   < > 105  --  105  --   CO2 24   < > 24   < > 23  --  25  --   GLUCOSE 49*   < > 91   < > 134*  --  119*  --   BUN 12   < > 12   < > 13  --  12  --   CREATININE 0.77   < > 0.81   < > 1.14  --  1.14  --   CALCIUM 8.3*   < > 8.2*   < > 8.1*  --  8.0*  --   MG 2.2  --  2.2  --   --   --   --   --  PHOS 1.5*   < > 2.0*   < > 3.3  --  1.9*  --    < > = values in this interval not displayed.   Liver Function Tests Recent Labs    05/31/19 1522 06/01/19 0417  ALBUMIN 2.8* 2.6*   No results for input(s): LIPASE, AMYLASE in the last 72 hours. Cardiac Enzymes No results for input(s): CKTOTAL, CKMB, CKMBINDEX, TROPONINI in the last 72 hours.  BNP: BNP (last 3 results) Recent Labs    03/23/2019 0113  BNP 655.7*    ProBNP (last 3 results) No results for input(s): PROBNP in the last 8760 hours.   D-Dimer No results for input(s): DDIMER in the last 72 hours. Hemoglobin A1C No results for input(s): HGBA1C in the last 72 hours. Fasting Lipid Panel No results for input(s): CHOL, HDL, LDLCALC, TRIG, CHOLHDL, LDLDIRECT in the last 72 hours. Thyroid Function Tests No results for input(s): TSH, T4TOTAL, T3FREE, THYROIDAB in the last 72 hours.  Invalid input(s): FREET3  Other results:   Imaging    No results found.   Medications:     Scheduled Medications: . aspirin EC  325 mg Oral Daily   Or  . aspirin  324 mg Per Tube Daily  . B-complex with vitamin C  1 tablet Per Tube Daily  . chlorhexidine gluconate (MEDLINE KIT)  15 mL Mouth Rinse BID  . Chlorhexidine Gluconate Cloth  6 each Topical Q0600  . citalopram  10 mg Per Tube Daily  . darbepoetin  (ARANESP) injection - NON-DIALYSIS  100 mcg Subcutaneous Q Tue-1800  . docusate  200 mg Per Tube Daily  . feeding supplement (PRO-STAT SUGAR FREE 64)  30 mL Per Tube BID  . gabapentin  100 mg Oral Q8H  . insulin aspart  0-6 Units Subcutaneous Q4H  . mouth rinse  15 mL Mouth Rinse 10 times per day  . melatonin  3 mg Per Tube QHS  . metoCLOPramide (REGLAN) injection  10 mg Intravenous Q6H  . midodrine  20 mg Per Tube Q8H  . neomycin-polymyxin-hydrocortisone  3 drop Left EAR Q12H  . neostigmine  0.25 mg Subcutaneous Q6H  . pantoprazole sodium  40 mg Per Tube BID  . polyethylene glycol  17 g Oral Daily  . QUEtiapine  25 mg Oral QHS  . rOPINIRole  0.5 mg Oral QHS  . sennosides  5 mL Per Tube BID  . sodium chloride flush  10-40 mL Intracatheter Q12H    Infusions: .  prismasol BGK 4/2.5 500 mL/hr at 05/31/19 2055  .  prismasol BGK 4/2.5 300 mL/hr at 05/31/19 2305  . sodium chloride Stopped (05/29/19 1600)  . sodium chloride    . sodium chloride Stopped (05/31/19 0958)  . dextrose 5 % and 0.9% NaCl Stopped (05/31/19 1626)  . DOBUTamine 2.5 mcg/kg/min (06/01/19 0500)  . feeding supplement (VITAL 1.5 CAL) 20 mL/hr at 05/31/19 1355  . norepinephrine (LEVOPHED) Adult infusion 3 mcg/min (06/01/19 0500)  . prismasol BGK 4/2.5 1,000 mL/hr at 06/01/19 0157    PRN Medications: Place/Maintain arterial line **AND** sodium chloride, sodium chloride, alum & mag hydroxide-simeth, dextrose, diphenhydrAMINE, fentaNYL (SUBLIMAZE) injection, Gerhardt's butt cream, heparin, heparin, levalbuterol, ondansetron (ZOFRAN) IV, oxyCODONE, pneumococcal 23 valent vaccine, sodium chloride flush, sorbitol     Assessment/Plan   1. Acute systolic HF ->  Cardiogenic Shock  - Due to iCM. EF 20-25% - Impella 5.5 placed on 4/2.   - s/p CABG/MVRepair on 4/6  - Impella out 4/9.  - Echo  4/10 with EF 25-30%, normal RV, no MR.  - Echo 4/22 EF 30-35% mildly reduced RV MVR ok   - Dobutamine added 5/14 for low co-ox 39%  and inability to wean NE.  - Has responded well to dobutamine 2.5. Continue to wean NE as tolerated. Hopefull we can get off today.  - Repeat echo 05/29/19. EF 35-40% RV ok.  - Degree of cardiogenic shock seems out of proportion to LV/RV function. Unfortunately every time we try to wean NE his co-ox drops markedly and he has clearly become inotrope dependent. Now seems to be responding to dobutamine. Will continue to wean NE and assess response. Hopefully can get NE off today  2. 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 Echo stable - No s/s ischemia - On ASA/Crestor  3. Acute hypoxic respiratory failure - Due to pulmonary edema and PNA - Extubated 4/9 re-intubated on 4/12. Failed re-extubation on 4/14. Now s/p tracheostomy - S/P Bronch 4/27 - 60K yeast. -> ? Colonization.  - CT scan 5/7 with diffuse bilateral infiltrates c/w ARDS and large bilateral effusions R>L.  - s/p R chest tube - Remains on vent FiO2 40% - CCM following. Hopefully can continue TC trials today  4. Mitral Regurgitation - Mod-severe on ECHO - s/p MV repair, TEE 4/9 with minimal MR s/p repair.   - Echo 4/10 with no significant MR.  - echo 4/22 MVR stable - Repeat echo 05/29/19. EF 35-40% RV ok.  MVR ok  5. Uncontrolled DM - Hgb A1C 9.  - On insulin and sliding scale.  - No change  6. AKI - due to shock - Remains anuric post-op - Continue CVVHD per Nephrology. CVP low. Can likely keep even today.   7. Acute blood loss Anemia  - Transfused 1UPRBCs 5/9/21and 5/14 - hgb 8.1 today - No obvious site of bleeding.   8. Polymorphic VT - Recurrent VT during respiratory distress 4/14, - Off amio  - No further VT.  - Keep K> 4.0 Mg > 2.0  - Rhythm stable   9. Severe Malnutrition - Prealbumin improved, up from 8.9>>18.7 - Nutrition on board - Continue w/ TFs - Likely needs PEG  10. Atrial fibrillation. paroxysmal - Remains in NSR.  - Amiodarone was stopped.   24. Debility,  severe - as above will likely need LTAC on d/c  12. Chronic pain issues - appears comfortable - having severe constipation. CCM managing  - Decrease Oxy from q3 to q4. Titrate down slowly. Can we get to methadone?   Remains very tenuous. Plan as above. Wean NE. Hopefully get to [facility] trials. Decrease pain meds. Continue dobutamine.   CRITICAL CARE Performed by: Glori Bickers  Total critical care time: 35 minutes  Critical care time was exclusive of separately billable procedures and treating other patients.  Critical care was necessary to treat or prevent imminent or life-threatening deterioration.  Critical care was time spent personally by me (independent of midlevel providers or residents) on the following activities: development of treatment plan with patient and/or surrogate as well as nursing, discussions with consultants, evaluation of patient's response to treatment, examination of patient, obtaining history from patient or surrogate, ordering and performing treatments and interventions, ordering and review of laboratory studies, ordering and review of radiographic studies, pulse oximetry and re-evaluation of patient's condition.    Glori Bickers, MD  5:09 AM

## 2019-06-01 NOTE — Consult Note (Signed)
WOC Nurse Consult Note: Reason for Consult: Sacral Pressure Injury previously (according to flow sheet on 4/27)  noted to be a DTPI now presents as an Unstageable Pressure Injury with yellow fibrinous slough obscuring wound bed. Wound type:Pressure in the presence of critical illness Pressure Injury POA: No Measurement: Sacrum: 3cm x 3cm total area for triangularly shaped tissue injury.  There is a thin and fragile skin bridge attempting to grow in the center of the wound which may divide this area into two wounds. Wound bed: 100% obscured by the presence of yellow, fibrinous slough Drainage (amount, consistency, odor) small amount of yellow exudate on the old dressing consistent with autolytically dissolving nonviable tissue Periwound:mild periwound erythema Dressing procedure/placement/frequency: I will implement a POC using collagenase (Santyl) daily as an enzymatic debriding agent.  Prevalon Boots are provided. A pressure redistribution chair pad is provided for OOB use.  Patient examined with wife at bedside.  Questions answered to her expressed satisfaction.  The assistance of the Bedside RN is appreciated during my examination.  [facility] nursing team will follow, seeing every 7-10 days but and remain available to this patient, the nursing and medical teams.  Please re-consult if needed in between visits. Thanks, Maudie Flakes, MSN, RN, [place], [facility]  Pager# [phone]

## 2019-06-01 NOTE — Progress Notes (Signed)
TCTS BRIEF SICU PROGRESS NOTE  30 Days Post-Op  S/P Procedure(s) (LRB): VIDEO BRONCHOSCOPY USING DISPOSABLE ANESTHESIA SCOPE (N/A) TRACHEOSTOMY (N/A)   Stable day  Plan: Continue current plan  Rexene Alberts, MD 06/01/2019 5:22 PM

## 2019-06-01 NOTE — Progress Notes (Signed)
Patient ID: JAQUAE RIEVES, male   DOB: 10/15/1955, 64 y.o.   MRN: [id number]    S: Slightly diminished nor epi requirement over the last 24 hours.  Overall unchanged and no acute issues overnight.  O:BP (!) 124/49   Pulse 78   Temp 97.7 F (36.5 C) (Oral)   Resp (!) 25   Ht 5' 11"  (1.803 m) Comment: measured x 3  Wt 63 kg   SpO2 100%   BMI 19.37 kg/m   Intake/Output Summary (Last 24 hours) at 06/01/2019 1006 Last data filed at 06/01/2019 1000 Gross per 24 hour  Intake 1401.01 ml  Output 1807 ml  Net -405.99 ml   Intake/Output: I/O last 3 completed shifts: In: 5058.9 [I.V.:3396.8; Blood:315; Other:150; NG/GT:940; IV Piggyback:257.1] Out: 8119 [Emesis/NG output:50; JYNWG:9562; Chest Tube:500]  Intake/Output this shift:  Total I/O In: 183.1 [I.V.:9.2; NG/GT:60; IV Piggyback:113.9] Out: 191 [Other:191] Weight change: 0.8 kg Gen: frail, thin, cachetic, alert Neck: lef IJ vascath (placed 5/3) CVS: normal rate Resp: Coarse bilateral breath sounds, ventilated/trached Abd: +BS, soft, NT/ND Ext: Trace bilateral lower extremity edema at the ankle- atrophied legs   Recent Labs  Lab 05/27/19 0530 05/27/19 0814 05/29/19 0855 05/29/19 0855 05/29/19 1612 05/30/19 0445 05/30/19 0451 05/30/19 0451 05/30/19 1606 05/31/19 0439 05/31/19 0453 05/31/19 1522 06/01/19 0417 06/01/19 0419 06/01/19 0707  NA 137   < > 137   < > 140   < > 137   < > 137 139 137 139 136 140 137  K 4.5   < > 5.1   < > 4.8   < > 4.6   < > 4.2 4.6 4.8 4.7 4.4 4.6 4.6  CL 102   < > 102  --  105  --  105  --  103  --  104 105 105  --   --   CO2 24   < > 27  --  27  --  24  --  25  --  24 23 25   --   --   GLUCOSE 130*   < > 88  --  153*  --  49*  --  160*  --  91 134* 119*  --   --   BUN 28*   < > 20  --  17  --  12  --  12  --  12 13 12   --   --   CREATININE 1.15   < > 0.99  --  0.93  --  0.77  --  0.75  --  0.81 1.14 1.14  --   --   ALBUMIN 3.2*  3.2*   < > 3.3*  --  3.2*  --  3.1*  --  3.1*  --  2.9*  2.8* 2.6*  --   --   CALCIUM 9.1   < > 8.5*  --  8.5*  --  8.3*  --  8.0*  --  8.2* 8.1* 8.0*  --   --   PHOS 1.3*   < > 2.5  --  2.2*  --  1.5*  --  6.5*  --  2.0* 3.3 1.9*  --   --   AST 29  --   --   --   --   --   --   --   --   --   --   --   --   --   --   ALT 15  --   --   --   --   --   --   --   --   --   --   --   --   --   --    < > =  values in this interval not displayed.   Liver Function Tests: Recent Labs  Lab 05/27/19 0530 05/27/19 1550 05/31/19 0453 05/31/19 1522 06/01/19 0417  AST 29  --   --   --   --   ALT 15  --   --   --   --   ALKPHOS 294*  --   --   --   --   BILITOT 0.9  --   --   --   --   PROT 7.1  --   --   --   --   ALBUMIN 3.2*  3.2*   < > 2.9* 2.8* 2.6*   < > = values in this interval not displayed.   No results for input(s): LIPASE, AMYLASE in the last 168 hours. No results for input(s): AMMONIA in the last 168 hours. CBC: Recent Labs  Lab 05/29/19 0855 05/30/19 0445 05/30/19 0451 05/30/19 0451 05/30/19 1606 05/31/19 0439 05/31/19 0453 05/31/19 0453 06/01/19 0417 06/01/19 0419 06/01/19 0707  WBC 9.6  --  8.1   < > 6.3  --  8.2  --  6.2  --   --   HGB 7.6*   < > 6.9*   < > 7.7*   < > 8.4*   < > 8.1* 8.5* 7.8*  HCT 25.0*   < > 22.1*   < > 24.7*   < > 26.8*   < > 25.3* 25.0* 23.0*  MCV 100.8*  --  99.5  --  99.6  --  94.4  --  95.1  --   --   PLT 132*  --  PLATELET CLUMPS NOTED ON SMEAR, UNABLE TO ESTIMATE   < > PLATELET CLUMPS NOTED ON SMEAR, UNABLE TO ESTIMATE  --  104*  --  104*  --   --    < > = values in this interval not displayed.   Cardiac Enzymes: No results for input(s): CKTOTAL, CKMB, CKMBINDEX, TROPONINI in the last 168 hours. CBG: Recent Labs  Lab 05/31/19 1939 05/31/19 2208 05/31/19 2351 06/01/19 0335 06/01/19 0742  GLUCAP 102* 103* 126* 116* 108*    Iron Studies: No results for input(s): IRON, TIBC, TRANSFERRIN, FERRITIN in the last 72 hours. Studies/Results: DG Chest Port 1 View  Result Date:  06/01/2019 CLINICAL DATA:  Heart failure, cardiogenic shock, respiratory failure. EXAM: PORTABLE CHEST 1 VIEW COMPARISON:  Chest x-rays dated 05/30/2019 and 05/29/2019. FINDINGS: Stable cardiomegaly. Tubes and lines are stable in position, including a RIGHT-sided chest tube. No pleural effusion or pneumothorax is seen. Diffuse bilateral airspace opacities are unchanged. IMPRESSION: 1. Stable chest x-ray. 2. Stable diffuse bilateral airspace opacities, pulmonary edema versus multifocal pneumonia. Electronically Signed   By: Franki Cabot M.D.   On: 06/01/2019 09:37   . aspirin EC  325 mg Oral Daily   Or  . aspirin  324 mg Per Tube Daily  . B-complex with vitamin C  1 tablet Per Tube Daily  . chlorhexidine gluconate (MEDLINE KIT)  15 mL Mouth Rinse BID  . Chlorhexidine Gluconate Cloth  6 each Topical Q0600  . citalopram  10 mg Per Tube Daily  . darbepoetin (ARANESP) injection - NON-DIALYSIS  100 mcg Subcutaneous Q Tue-1800  . docusate  200 mg Per Tube Daily  . feeding supplement (PRO-STAT SUGAR FREE 64)  30 mL Per Tube BID  . gabapentin  100 mg Oral Q8H  . insulin aspart  0-6 Units Subcutaneous Q4H  . mouth rinse  15 mL Mouth Rinse  10 times per day  . melatonin  3 mg Per Tube QHS  . metoCLOPramide (REGLAN) injection  10 mg Intravenous Q6H  . midodrine  20 mg Per Tube Q8H  . neomycin-polymyxin-hydrocortisone  3 drop Left EAR Q12H  . neostigmine  0.25 mg Subcutaneous Q6H  . pantoprazole sodium  40 mg Per Tube BID  . polyethylene glycol  17 g Oral Daily  . QUEtiapine  25 mg Oral QHS  . rOPINIRole  0.5 mg Oral QHS  . sennosides  5 mL Per Tube BID  . sodium chloride flush  10-40 mL Intracatheter Q12H    BMET    Component Value Date/Time   NA 137 06/01/2019 0707   K 4.6 06/01/2019 0707   CL 105 06/01/2019 0417   CO2 25 06/01/2019 0417   GLUCOSE 119 (H) 06/01/2019 0417   BUN 12 06/01/2019 0417   CREATININE 1.14 06/01/2019 0417   CALCIUM 8.0 (L) 06/01/2019 0417   GFRNONAA >60 06/01/2019  0417   GFRAA >60 06/01/2019 0417   CBC    Component Value Date/Time   WBC 6.2 06/01/2019 0417   RBC 2.66 (L) 06/01/2019 0417   HGB 7.8 (L) 06/01/2019 0707   HCT 23.0 (L) 06/01/2019 0707   PLT 104 (L) 06/01/2019 0417   MCV 95.1 06/01/2019 0417   MCH 30.5 06/01/2019 0417   MCHC 32.0 06/01/2019 0417   RDW 18.0 (H) 06/01/2019 0417   LYMPHSABS 1.1 05/05/2019 0402   MONOABS 1.1 (H) 05/08/2019 0402   EOSABS 0.2 05/03/2019 0402   BASOSABS 0.0 05/01/2019 0402    Brief HPI: admitted to outside hospital on 3/26 with SOB, new low EF and NSTEMI txd to [facility] 3/29: 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 Admission Scr was 0.99.  Assessment/Plan:  1. Acute hypoxic respiratory failure on 05/23/19- on vent via trach.  S/p right chest tube. Ventilation status unchanged. 2. Oliguric/anuricAKI- CRRT started on 4/10 and stopped 4/29 w/ attempted transition to IHD on 05/17/19 but failed and now back on CRRT 1. Remains anuric. 2. Resumed CRRT on 05/23/19 following acute worsening of his oxygenation and hypotension.  3. Dialysate 4K/2.5 Ca for replacement fluids and dialysate.  no heparin.   4. We will continue to run even and monitor pressor requirement. 5. Excellent clearance on current CRRT.  Continues to have good clearance on DFR of 1000. 6. Will keep on CRRT as long as no UOP and req pressors 3. Vascular access- leftIJ tunneled HD catheter placed by IR on 05/19/19- now 3 days old- hopefully replace to tunneled cath next week; ideally would have off pressors 4. Anemia of CKD, multifactorial-transfusions per primary team.  Hemoglobin acceptable today.  Darbo 164mg started 5/11. CTM and consider uptitrating as needed 5. CAD- 3 vessel with EF 25% s/p CABG and MVR 44/9/44complicated by cardiogenic shock as below 6. Acute systolic CHF and cardiogenic shock- still on pressors. Volume  improved with CRRT/IHD.  On pretty max midodrine as well, unlike to benefit from higher dose. 7. Elytes- Hypophosphatemia today and will replace with Naphos 389ml today. Monitor phos daily. K acceptable today. Decreasing DFR as above 8. MR s/p MVR 9. Severe debility- will likely need LTAC if survives. [place] possibly in the works  [phone]

## 2019-06-02 LAB — RENAL FUNCTION PANEL
Albumin: 2.4 g/dL — ABNORMAL LOW (ref 3.5–5.0)
Albumin: 2.6 g/dL — ABNORMAL LOW (ref 3.5–5.0)
Anion gap: 3 — ABNORMAL LOW (ref 5–15)
Anion gap: 6 (ref 5–15)
BUN: 12 mg/dL (ref 8–23)
BUN: 11 mg/dL (ref 8–23)
CO2: 27 mmol/L (ref 22–32)
CO2: 27 mmol/L (ref 22–32)
Calcium: 7.9 mg/dL — ABNORMAL LOW (ref 8.9–10.3)
Calcium: 8.2 mg/dL — ABNORMAL LOW (ref 8.9–10.3)
Chloride: 106 mmol/L (ref 98–111)
Chloride: 104 mmol/L (ref 98–111)
Creatinine, Ser: 1.14 mg/dL (ref 0.61–1.24)
Creatinine, Ser: 1.11 mg/dL (ref 0.61–1.24)
GFR calc Af Amer: >60 mL/min (ref >60)
GFR calc Af Amer: >60 mL/min (ref >60)
GFR calc non Af Amer: >60 mL/min (ref >60)
GFR calc non Af Amer: >60 mL/min (ref >60)
Glucose, Bld: 172 mg/dL — ABNORMAL HIGH (ref 70–99)
Glucose, Bld: 91 mg/dL (ref 70–99)
Phosphorus: 3.1 mg/dL (ref 2.5–4.6)
Phosphorus: 3.1 mg/dL (ref 2.5–4.6)
Potassium: 4.6 mmol/L (ref 3.5–5.1)
Potassium: 5 mmol/L (ref 3.5–5.1)
Sodium: 136 mmol/L (ref 135–145)
Sodium: 137 mmol/L (ref 135–145)

## 2019-06-02 LAB — POCT I-STAT 7, (LYTES, BLD GAS, ICA,H+H)
Acid-Base Excess: 2 mmol/L (ref 0.0–2.0)
Bicarbonate: 26.3 mmol/L (ref 20.0–28.0)
Calcium, Ion: 1.15 mmol/L (ref 1.15–1.40)
HCT: 22 % — ABNORMAL LOW (ref 39.0–52.0)
Hemoglobin: 7.5 g/dL — ABNORMAL LOW (ref 13.0–17.0)
O2 Saturation: 99 %
Patient temperature: 97.9
Potassium: 4.3 mmol/L (ref 3.5–5.1)
Sodium: 140 mmol/L (ref 135–145)
TCO2: 27 mmol/L (ref 22–32)
pCO2 arterial: 39.8 mmHg (ref 32.0–48.0)
pH, Arterial: 7.426 (ref 7.350–7.450)
pO2, Arterial: 119 mmHg — ABNORMAL HIGH (ref 83.0–108.0)

## 2019-06-02 LAB — COOXEMETRY PANEL
Carboxyhemoglobin: 1.7 % — ABNORMAL HIGH (ref 0.5–1.5)
Methemoglobin: 1.3 % (ref 0.0–1.5)
O2 Saturation: 62.9 %
Total hemoglobin: 8 g/dL — ABNORMAL LOW (ref 12.0–16.0)

## 2019-06-02 LAB — CBC
HCT: 25.5 % — ABNORMAL LOW (ref 39.0–52.0)
Hemoglobin: 7.9 g/dL — ABNORMAL LOW (ref 13.0–17.0)
MCH: 30.2 pg (ref 26.0–34.0)
MCHC: 31 g/dL (ref 30.0–36.0)
MCV: 97.3 fL (ref 80.0–100.0)
Platelets: 114 10*3/uL — ABNORMAL LOW (ref 150–400)
RBC: 2.62 MIL/uL — ABNORMAL LOW (ref 4.22–5.81)
RDW: 17.5 % — ABNORMAL HIGH (ref 11.5–15.5)
WBC: 4.4 10*3/uL (ref 4.0–10.5)
nRBC: 0 % (ref 0.0–0.2)

## 2019-06-02 LAB — GLUCOSE, CAPILLARY
Glucose-Capillary: 112 mg/dL — ABNORMAL HIGH (ref 70–99)
Glucose-Capillary: 144 mg/dL — ABNORMAL HIGH (ref 70–99)
Glucose-Capillary: 157 mg/dL — ABNORMAL HIGH (ref 70–99)
Glucose-Capillary: 159 mg/dL — ABNORMAL HIGH (ref 70–99)
Glucose-Capillary: 161 mg/dL — ABNORMAL HIGH (ref 70–99)
Glucose-Capillary: 170 mg/dL — ABNORMAL HIGH (ref 70–99)
Glucose-Capillary: 94 mg/dL (ref 70–99)

## 2019-06-02 LAB — MAGNESIUM: Magnesium: 2.3 mg/dL (ref 1.7–2.4)

## 2019-06-02 LAB — PREPARE RBC (CROSSMATCH)

## 2019-06-02 MED ORDER — VITAL 1.5 CAL PO LIQD
1000.0000 mL | ORAL | Status: DC
  Filled 2019-06-02 (×2): qty 1000

## 2019-06-02 NOTE — Progress Notes (Signed)
OT Cancellation Note  Patient Details Name: Brendan [name] MRN: [id number] DOB: 1955-05-25   Cancelled Treatment:    Reason Eval/Treat Not Completed: Patient at procedure or test/ unavailable;Medical issues which prohibited therapy(Conversion temp to tunneled dialysis catheter placement.) Pt unable to wean from vent today. OT to continue to follow.  Jefferey Pica, OTR/L Acute Rehabilitation Services Pager: [phone] Office: [phone]   Jefferey Pica 06/02/2019, 4:32 PM

## 2019-06-02 NOTE — Progress Notes (Signed)
SLP Cancellation Note  Patient Details Name: Brendan [name] MRN: [id number] DOB: February 27, 1955   Cancelled treatment:       Reason Eval/Treat Not Completed: Patient not medically ready. Pt vented today. Will f/u for needs    Pernell Lenoir, Katherene Ponto 06/02/2019, 10:05 AM

## 2019-06-02 NOTE — Progress Notes (Signed)
      [address]       [place] [address]             [phone]        BP (!) 93/58   Pulse 72   Temp 98.2 F (36.8 C) (Oral)   Resp 14   Ht 5\' 11"  (1.803 m) Comment: measured x 3  Wt 62.3 kg   SpO2 97%   BMI 19.16 kg/m   Dobutamine @ 2.5, norepi @ 4 On pressure support of 15   Intake/Output Summary (Last 24 hours) at 06/02/2019 1802 Last data filed at 06/02/2019 1700 Gross per 24 hour  Intake 1889.95 ml  Output 1261 ml  Net 628.95 ml   Continue current Rx  Steven C. Roxan Hockey, MD [facility] [phone]

## 2019-06-02 NOTE — Progress Notes (Addendum)
31 Days Post-Op Procedure(s) (LRB): VIDEO BRONCHOSCOPY USING DISPOSABLE ANESTHESIA SCOPE (N/A) TRACHEOSTOMY (N/A) Subjective: Dobutamine started and weaning norepi 1 U PRBC for Hb 7.5 and pressor requirement CRT to run sl positive- wt at baseline  Objective: Vital signs in last 24 hours: Temp:  [96.5 F (35.8 C)-98.7 F (37.1 C)] 98 F (36.7 C) (05/17 0915) Pulse Rate:  [71-87] 83 (05/17 0945) Cardiac Rhythm: Normal sinus rhythm (05/17 0745) Resp:  [13-27] 13 (05/17 0945) BP: (81-122)/(37-68) 103/61 (05/17 0930) SpO2:  [94 %-100 %] 100 % (05/17 0945) Arterial Line BP: (70-157)/(36-70) 109/46 (05/17 0945) FiO2 (%):  [40 %] 40 % (05/17 0850) Weight:  [62.3 kg] 62.3 kg (05/17 0400)  Hemodynamic parameters for last 24 hours: CVP:  [3 mmHg-9 mmHg] 5 mmHg  Intake/Output from previous day: 05/16 0701 - 05/17 0700 In: 1590.3 [I.V.:280.3; NG/GT:1050; IV Piggyback:260] Out: 4268 [Chest Tube:160] Intake/Output this shift: Total I/O In: 192.1 [I.V.:12.1; NG/GT:180] Out: 146 [Other:126; Chest Tube:20]       Exam    General- alert and comfortable- appears debilitated    Neck- no JVD, no cervical adenopathy palpable, no carotid bruit   Lungs- clear without rales, wheezes. Sternal incision clean   Cor- regular rate and rhythm, no murmur , gallop   Abdomen- soft, non-tender   Extremities - warm, non-tender, minimal edema   Neuro- oriented, appropriate, no focal weakness   Lab Results: Recent Labs    06/01/19 0417 06/01/19 0419 06/02/19 0443 06/02/19 0445  WBC 6.2  --   --  4.4  HGB 8.1*   < > 7.5* 7.9*  HCT 25.3*   < > 22.0* 25.5*  PLT 104*  --   --  114*   < > = values in this interval not displayed.   BMET:  Recent Labs    06/01/19 1456 06/01/19 1456 06/02/19 0443 06/02/19 0445  NA 136   < > 140 136  K 4.4   < > 4.3 4.6  CL 104  --   --  106  CO2 26  --   --  27  GLUCOSE 123*  --   --  172*  BUN 10  --   --  12  CREATININE 1.13  --   --  1.14  CALCIUM 8.3*   --   --  7.9*   < > = values in this interval not displayed.    PT/INR: No results for input(s): LABPROT, INR in the last 72 hours. ABG    Component Value Date/Time   PHART 7.426 06/02/2019 0443   HCO3 26.3 06/02/2019 0443   TCO2 27 06/02/2019 0443   ACIDBASEDEF 3.0 (H) 04/28/2019 0735   O2SAT 99.0 06/02/2019 0443   CBG (last 3)  Recent Labs    06/02/19 0014 06/02/19 0418 06/02/19 0729  GLUCAP 170* 161* 159*    Assessment/Plan: S/P Procedure(s) (LRB): VIDEO BRONCHOSCOPY USING DISPOSABLE ANESTHESIA SCOPE (N/A) TRACHEOSTOMY (N/A) Cont to wean norepi CRT until off pressors- last echo with EF 35-40 PS wean then back to trach collar- CXR showing improvement Will need SNF when off vent, CRT 1 unit PRBC today Leave chest tube for recurrent R effusion related to malnutrition  LOS: 49 days    [name] 06/02/2019

## 2019-06-02 NOTE — Progress Notes (Addendum)
   Patient Status: [facility] - In-pt  Assessment and Plan: Patient in need of venous access.   Conversion temp to tunneled dialysis catheter placement  ______________________________________________________________________   History of Present Illness: THANE AGE is a 64 y.o. male   CABG 6/75/91 complicated with cardiogenic shock Worsening volume and respiratory status Started CRRT 04/26/19  Now trach/vent Attempted transition to HD-- failed-- now back to CRRT Left IJ temp cath IR 58/80 - now 37 weeks old Needs change but Nephrology wants tunneled cath secondary prolonged course and needs    Allergies and medications reviewed.   Review of Systems: A 12 point ROS discussed and pertinent positives are indicated in the HPI above.  All other systems are negative.   Vital Signs: BP 123/76   Pulse 76   Temp 97.8 F (36.6 C) (Oral)   Resp 11   Ht 5\' 11"  (1.803 m) Comment: measured x 3  Wt 137 lb 5.6 oz (62.3 kg)   SpO2 100%   BMI 19.16 kg/m   Physical Exam Cardiovascular:     Rate and Rhythm: Normal rate.  Pulmonary:     Comments: Trach/vent Neurological:     Comments: No response  Psychiatric:     Comments: Wife Lou at bedside Consented for procedure      Imaging reviewed.   Labs:  COAGS: Recent Labs    04/01/2019 2149 04/19/19 0313 05/04/2019 1642 04/23/19 1033 04/29/19 0444 04/30/19 0436 05/01/19 0449 04/24/2019 0334  INR 1.3*  --  1.6*  --   --   --  1.6*  --   APTT 65*   < > 41*   < > 42* 39* 42* 59*   < > = values in this interval not displayed.    BMP: Recent Labs    05/31/19 1522 05/31/19 1522 06/01/19 0417 06/01/19 0419 06/01/19 0707 06/01/19 1456 06/02/19 0443 06/02/19 0445  NA 139   < > 136   < > 137 136 140 136  K 4.7   < > 4.4   < > 4.6 4.4 4.3 4.6  CL 105  --  105  --   --  104  --  106  CO2 23  --  25  --   --  26  --  27  GLUCOSE 134*  --  119*  --   --  123*  --  172*  BUN 13  --  12  --   --  10  --  12  CALCIUM 8.1*  --   8.0*  --   --  8.3*  --  7.9*  CREATININE 1.14  --  1.14  --   --  1.13  --  1.14  GFRNONAA >60  --  >60  --   --  >60  --  >60  GFRAA >60  --  >60  --   --  >60  --  >60   < > = values in this interval not displayed.    Request for conversion from temporary dialysis catheter to tunneled catheter placement Wife has consented to procedure She understand procedure benefits and risks and agreeable to proceed    Electronically Signed: Lavonia Drafts, PA-C 06/02/2019, 11:10 AM   I spent a total of 15 minutes in face to face in clinical consultation, greater than 50% of which was counseling/coordinating care for venous access.Patient ID: JABEZ MOLNER, male   DOB: October 21, 1955, 64 y.o.   MRN: [id number]

## 2019-06-02 NOTE — Progress Notes (Signed)
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 Admission Scr was 0.99.  Assessment/ Plan:   1. Acute hypoxic respiratory failure on 05/23/19- on vent via trach. S/p right chest tube. Ventilation status unchanged. 2. Oliguric/anuricAKI- CRRT started on 4/10 and stopped 4/29 w/ attempted transition to IHD on 05/17/19 but failed and now back on CRRT 1. Remains anuric. 2. Resumed CRRT on 05/23/19 following acute worsening of his oxygenation and hypotension.  3. Dialysate 4K/2.5 Ca for replacement fluids and dialysate. no heparin.  500/300/1000 pre/post/dialysate.  4. OK to be 526m pos/hr to help wean pressors. 5. Will keep on CRRT as long as no UOP and req pressors 3. Vascular access- leftSCV temp  HD catheter placed by IR on 05/19/19- now 161days old- will consult VIR to change to a IJ tunneled as he's in for a prolonged course. 4. Anemia of CKD, multifactorial-transfusions per primary team.  Hemoglobin acceptable today.  Darbo 1051m started 5/11. CTM and consider uptitrating as needed 5. CAD- 3 vessel with EF 25% s/p CABG and MVR 4/0/3/55omplicated by cardiogenic shock as below 6. Acute systolic CHF and cardiogenic shock- still on pressors. Volume improved with CRRT/IHD.  On pretty max midodrine as well, unlike to benefit from higher dose. 7. Elytes-  Monitor phos daily. K acceptable today.  8. MR s/p MVR 9. Severe debility- will likely need LTAC if survives. [place]  Subjective:   Slightly diminished nor epi requirement over the last 24 hours.  Overall unchanged and no acute issues overnight. Dobutamine 2.5 and Levo 5; Levo titrated as low as 26m8mthis am but back up to 5.    Objective:   BP (!) 101/59   Pulse 78   Temp 98 F (36.7 C)  (Oral)   Resp 16   Ht _0  (1.803 m) Comment: measured x 3  Wt 62.3 kg   SpO2 99%   BMI 19.16 kg/m   Intake/Output Summary (Last 24 hours) at 06/02/2019 0912 Last data filed at 06/02/2019 0800 Gross per 24 hour  Intake 1539.09 ml  Output 1651 ml  Net -111.91 ml   Weight change: -0.7 kg  Physical Exam: Gen: frail, thin, cachetic, alert Neck: lef IJ vascath (placed 5/3) CVS: normal rate Resp: Coarse bilateral breath sounds, ventilated/trached Abd: +BS, soft, NT/ND Ext: Trace bilateral lower extremity edema at the ankle- atrophied legs  Imaging: DG Chest Port 1 View  Result Date: 06/01/2019 CLINICAL DATA:  Heart failure, cardiogenic shock, respiratory failure. EXAM: PORTABLE CHEST 1 VIEW COMPARISON:  Chest x-rays dated 05/30/2019 and 05/29/2019. FINDINGS: Stable cardiomegaly. Tubes and lines are stable in position, including a RIGHT-sided chest tube. No pleural effusion or pneumothorax is seen. Diffuse bilateral airspace opacities are unchanged. IMPRESSION: 1. Stable chest x-ray. 2. Stable diffuse bilateral airspace opacities, pulmonary edema versus multifocal pneumonia. Electronically Signed   By: StaFranki CabotD.   On: 06/01/2019 09:37    Labs: BMET Recent Labs  Lab 05/30/19 0451 05/30/19 0451 05/30/19 1606 05/31/19 0439 05/31/19 0453 05/31/19 0453 05/31/19 1522 06/01/19 0417 06/01/19 0419 06/01/19 0707 06/01/19 1456 06/02/19 0443 06/02/19 0445  NA 137   < > 137   < > 137   < > 139 136 140 137 136 140 136  K  4.6   < > 4.2   < > 4.8   < > 4.7 4.4 4.6 4.6 4.4 4.3 4.6  CL 105  --  103  --  104  --  105 105  --   --  104  --  106  CO2 24  --  25  --  24  --  23 25  --   --  26  --  27  GLUCOSE 49*  --  160*  --  91  --  134* 119*  --   --  123*  --  172*  BUN 12  --  12  --  12  --  13 12  --   --  10  --  12  CREATININE 0.77  --  0.75  --  0.81  --  1.14 1.14  --   --  1.13  --  1.14  CALCIUM 8.3*  --  8.0*  --  8.2*  --  8.1* 8.0*  --   --  8.3*  --  7.9*  PHOS  1.5*  --  6.5*  --  2.0*  --  3.3 1.9*  --   --  3.8  --  3.1   < > = values in this interval not displayed.   CBC Recent Labs  Lab 05/30/19 1606 05/31/19 0439 05/31/19 0453 05/31/19 0453 06/01/19 0417 06/01/19 0417 06/01/19 0419 06/01/19 0707 06/02/19 0443 06/02/19 0445  WBC 6.3  --  8.2  --  6.2  --   --   --   --  4.4  HGB 7.7*   < > 8.4*   < > 8.1*   < > 8.5* 7.8* 7.5* 7.9*  HCT 24.7*   < > 26.8*   < > 25.3*   < > 25.0* 23.0* 22.0* 25.5*  MCV 99.6  --  94.4  --  95.1  --   --   --   --  97.3  PLT PLATELET CLUMPS NOTED ON SMEAR, UNABLE TO ESTIMATE  --  104*  --  104*  --   --   --   --  114*   < > = values in this interval not displayed.    Medications:    . aspirin EC  325 mg Oral Daily   Or  . aspirin  324 mg Per Tube Daily  . B-complex with vitamin C  1 tablet Per Tube Daily  . chlorhexidine gluconate (MEDLINE KIT)  15 mL Mouth Rinse BID  . Chlorhexidine Gluconate Cloth  6 each Topical Q0600  . citalopram  10 mg Per Tube Daily  . collagenase   Topical Daily  . darbepoetin (ARANESP) injection - NON-DIALYSIS  100 mcg Subcutaneous Q Tue-1800  . docusate  200 mg Per Tube Daily  . feeding supplement (PRO-STAT SUGAR FREE 64)  30 mL Per Tube BID  . gabapentin  100 mg Oral Q8H  . insulin aspart  0-6 Units Subcutaneous Q4H  . mouth rinse  15 mL Mouth Rinse 10 times per day  . melatonin  3 mg Per Tube QHS  . metoCLOPramide (REGLAN) injection  10 mg Intravenous Q6H  . midodrine  20 mg Per Tube Q8H  . neomycin-polymyxin-hydrocortisone  3 drop Left EAR Q12H  . neostigmine  0.25 mg Subcutaneous Q6H  . pantoprazole sodium  40 mg Per Tube BID  . polyethylene glycol  17 g Oral Daily  . QUEtiapine  25 mg Oral QHS  . rOPINIRole  0.5 mg Oral QHS  . sennosides  5 mL Per Tube BID  . sodium chloride flush  10-40 mL Intracatheter Q12H      Otelia Santee, MD 06/02/2019, 9:12 AM

## 2019-06-02 NOTE — Progress Notes (Signed)
NAME:  Brendan [name], MRN:  [id number], DOB:  1955/10/14, LOS: 2 ADMISSION DATE:  03/22/2019, CONSULTATION DATE:  04/17/19 REFERRING MD:  Dr. Haroldine Laws, CHIEF COMPLAINT:  Respiratory distress   Brief History   32 yoM originally presented with SOB and fatigue at [facility] found to have new HFrEF, NSTEMI, and bilateral pleural effusions transferred to [facility] on 3/29 for further cardiac evaluation. 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 Started on lasix and milrinone gtts however developed NSVT. Was taken 4/1 for placement of IABP and swan for optimization prior to CABG. Was on precedex for agitation/ confusion, some concern for DTs. On the evening of 4/1, patient developed worsening respiratory distress and hypoxia, PCCM consulted for intubation and vent management.  Patient was extubated on 4/14 and developed worsening hypoxia on BiPAP. Overnight he developed pulseless VT requiring 1 minute of CPR and epinephrine to achieve ROSC. Neurovascularly intact afterward. Subsequently had increased work of breathing and worsening respiratory distress requiring intubation. Patient had tracheostomy performed on 04/27/2019. Trying to wean off ventilator.   Past Medical History  Tobacco abuse, HTN, poorly controlled diabetes, diabetic neuropathy  Significant Hospital Events   3/30 Lt heart cath/TEE performed 4/1 Intubated, RHC/IABP/PA cath 4/2 Impella placement 4/3 febrile over evening and night. Blood cultures obtained. Vanc/cefepime started. Hematuria--heparin held. 4/4 Extubated,abx transitioned to rocephin. Heparin restarted 4/6 Re-intubated for CABG/MVR 4/7 Extubated from CABG to BiPAP 4/9 Impella removed  4/9 TEE 4/10 CVC inserted &CRRT initiated  4/12 PCCMre-consulted re-intubation 4/14 Extubated to BiPAP 4/15 Re-intubated due to respiratory distress 4/16 Tracheostomy 4/27 bronch with BAL 5/7 called back for hypercarbic failure. #6 cuffed placed. Right chest tube placed for enlarging  effusion. 5/15-16 remains on vent, CVVHD  Consults:  TCTS PCCM HF Nephrology  Procedures:  R cephalic double lumen PICC LIJ tunneled HD catheter L brachial arterial line 6-0 cuffed shiley  Significant Diagnostic Tests:  3/30 R/ LHC >>  Ost LM to Mid LM lesion is 35% stenosed.  Prox LAD lesion is 90% stenosed.  Prox LAD to Mid LAD lesion is 50% stenosed.  Mid Cx lesion is 100% stenosed.  Prox RCA to Mid RCA lesion is 90% stenosed.  RPDA lesion is 95% stenosed.  LV end diastolic pressure is severely elevated.  Hemodynamic findings consistent with moderate pulmonary hypertension. 1. Severe 3 vessel obstructive CAD 2. High LV filling pressures 3. Reduced cardiac output with index 2.3 4. Moderate pulmonary HTN.  3/30TTE >> 1. Left ventricular ejection fraction, by estimation, is 30 to 35%. The left ventricle has moderately decreased function. The left ventricle demonstrates regional wall motion abnormalities.There is mild left ventricular hypertrophy. Left ventricular diastolic function could not be evaluated. There is severe hypokinesis of the left ventricular, entire inferior wall, inferoseptal wall, apical segment and lateral wall.  2. Right ventricular systolic function is hyperdynamic. The right ventricular size is normal. There is moderately elevated pulmonary artery systolic pressure.  3. Decreased posterior leaflet motion due to ischemic tethering of the mitral valve leaflets.  4. The mitral valve is abnormal. Moderate to severe mitral valve regurgitation.  5. The aortic valve is tricuspid. Aortic valve regurgitation is not visualized. Mild aortic valve sclerosis is present, with no evidence of aortic valve stenosis.  6. The inferior vena cava is normal in size with greater than 50% respiratory variability, suggesting right atrial pressure of 3 mmHg.   4/1 CT chest w/o >>Large bilateral pleural effusions with associated atelectasis.Moderate to severe  bilateral ground-glass opacities, likely reflecting pneumonia and/or edema.  4/1 RHC >> RA = 18 RV = 60/20  PA = 63/29 (42) PCW = 33 Fick cardiac output/index = 3.7/1.9 PVR = 2.5 WU FA sat = 98% PA sat = 47%, 49% PaPi = 2.2   Micro Data:  5/7 R pleural fluid neg 5/7 blood neg to date 5/6 tracheal aspirate enterobacter  Antimicrobials:    Interim history/subjective:   No issues overnight.  Tolerating tube feeds.  Remains on vasopressors  Objective   Blood pressure (!) 90/40, pulse 71, temperature 98.2 F (36.8 C), temperature source Oral, resp. rate 10, height _0  (1.803 m), weight 62.3 kg, SpO2 99 %. CVP:  [3 mmHg-9 mmHg] 5 mmHg  Vent Mode: PSV;CPAP FiO2 (%):  [40 %] 40 % Set Rate:  [25 bmp] 25 bmp Vt Set:  [560 mL] 560 mL PEEP:  [5 cmH20] 5 cmH20 Pressure Support:  [15 cmH20] 15 cmH20 Plateau Pressure:  [24 cmH20-26 cmH20] 26 cmH20   Intake/Output Summary (Last 24 hours) at 06/02/2019 1138 Last data filed at 06/02/2019 1100 Gross per 24 hour  Intake 1890.4 ml  Output 1733 ml  Net 157.4 ml   Filed Weights   05/31/19 0600 06/01/19 0600 06/02/19 0400  Weight: 62.2 kg 63 kg 62.3 kg    Examination:   GEN: Frail debilitated elderly male on mechanical life support HEENT: Tracheostomy tube in place, clean dry and intact CV: Regular rate rhythm, S1-S2 PULM: Clear to auscultation GI: Soft nontender nondistended EXT: Severe muscle wasting present upper and lower extremities NEURO: Moves all 4 extremities follows commands PSYCH: RASS 0 SKIN: Multiple areas of skin breakdown scabbing  Labs reviewed  Resolved Problems   Agitated delirium- improved - seroquel qHS, continue melatonin - continue PRN oxycodone - re-orient, encourage day/night cycles  Assessment & Plan:   Acute on chronic hypoxemic and hypercarbic respiratory failure after CABG now s/p trach. Multifactorial etiology.  Treated Enterobacter pneumonia status post antibiotics -Continues to  pressure support trials as tolerated -Possible trach collar trial as tolerated Likely need continued ventilator at night Chest tube management per primary.  Shock-again multifactorial.  See discussion in 5/8 note. I think at this point it is just cardiogenic and renal vasoplegic. -Mixed etiology for his shock. Continue norepinephrine dobutamine to maintain mean arterial pressure greater than 65.  ESRD  CVVHD per nephrology  Constipation/colonic ileus - Received relistor 5/13, already on aggressive bowel regimen, add neostigmine and smog enema -Tube feeds at goal  Dysphagia- Likely need PEG tube at some point  Hypoglycemia, resolved Learning tube feeds continue to follow  Profound muscular deconditioning -PT OT  Goals of care-continue current level of care  Best practice:  Diet: Tube feeds Pain/Anxiety/Delirium protocol (if indicated): see above VAP protocol (if indicated):HOB 30 degrees  DVT prophylaxis: ASA 336m GI prophylaxis: PPI Glucose control:SSI Mobility: BR Code Status: Full  Family Communication:per primary team Disposition:ICU pending CRRT and vent liberation  This patient is critically ill with multiple organ system failure; which, requires frequent high complexity decision making, assessment, support, evaluation, and titration of therapies. This was completed through the application of advanced monitoring technologies and extensive interpretation of multiple databases. During this encounter critical care time was devoted to patient care services described in this note for 31 minutes.  BGarner Nash DO [facility] 06/02/2019 11:39 AM

## 2019-06-02 NOTE — Progress Notes (Addendum)
Patient ID: Brendan [name], male   DOB: 03-Oct-1955, 64 y.o.   MRN: [id number]     Advanced Heart Failure Rounding Note  PCP-Cardiologist: No primary care provider on file.   Subjective:    Remains on vent and CRRT  Dobutamine 2.5 added for low co-ox 39% and inability to wean NE on 5/14.   Per RN, high NE requirements last PM up to 15. CRRT settings changed, plan now is to keep net + 50 ml/hr to allow NE wean. Currently off NE w/ MAP at 66 by A-line.    Tolerating Dobutamine ok. No arrhthymias.   Co-ox 63%. CVP 5   No complaints.  Objective:   Weight Range: 62.3 kg Body mass index is 19.16 kg/m.   Vital Signs:   Temp:  [96.5 F (35.8 C)-98.7 F (37.1 C)] 98.2 F (36.8 C) (05/17 0800) Pulse Rate:  [71-87] 78 (05/17 0800) Resp:  [15-27] 22 (05/17 0800) BP: (86-124)/(49-68) 107/68 (05/17 0115) SpO2:  [94 %-100 %] 100 % (05/17 0800) Arterial Line BP: (81-157)/(36-70) 112/56 (05/17 0800) FiO2 (%):  [40 %] 40 % (05/17 0400) Weight:  [62.3 kg] 62.3 kg (05/17 0400) Last BM Date: 06/02/19  Weight change: Filed Weights   05/31/19 0600 06/01/19 0600 06/02/19 0400  Weight: 62.2 kg 63 kg 62.3 kg    Intake/Output:   Intake/Output Summary (Last 24 hours) at 06/02/2019 0826 Last data filed at 06/02/2019 0800 Gross per 24 hour  Intake 1605.48 ml  Output 1730 ml  Net -124.52 ml      Physical Exam   CVP 5  General:  Thin/ chronically ill appearing and cachetic WM. On vent through trach.   HEENT: + cor trak  Neck: supple. no JVD. + TC Carotids 2+ bilat; no bruits. No lymphadenopathy or thryomegaly appreciated. Cor: PMI nondisplaced. Regular rate & rhythm. No rubs, gallops or murmurs. Lungs: coarse Abdomen: soft, nontender, nondistended. No hepatosplenomegaly. No bruits or masses. Good bowel sounds. Extremities: no cyanosis, clubbing, rash, thin extremities 1+ ankle edema, L>R Neuro: lethargic but awakens. Nonfocal  Telemetry   NSR 70-80s Personally reviewed   Labs     CBC Recent Labs    06/01/19 0417 06/01/19 0419 06/02/19 0443 06/02/19 0445  WBC 6.2  --   --  4.4  HGB 8.1*   < > 7.5* 7.9*  HCT 25.3*   < > 22.0* 25.5*  MCV 95.1  --   --  97.3  PLT 104*  --   --  114*   < > = values in this interval not displayed.   Basic Metabolic Panel Recent Labs    06/01/19 0417 06/01/19 0419 06/01/19 1456 06/01/19 1456 06/02/19 0443 06/02/19 0445  NA 136   < > 136   < > 140 136  K 4.4   < > 4.4   < > 4.3 4.6  CL 105   < > 104  --   --  106  CO2 25   < > 26  --   --  27  GLUCOSE 119*   < > 123*  --   --  172*  BUN 12   < > 10  --   --  12  CREATININE 1.14   < > 1.13  --   --  1.14  CALCIUM 8.0*   < > 8.3*  --   --  7.9*  MG 2.4  --   --   --   --  2.3  PHOS 1.9*   < >  3.8  --   --  3.1   < > = values in this interval not displayed.   Liver Function Tests Recent Labs    06/01/19 1456 06/02/19 0445  ALBUMIN 2.6* 2.4*   No results for input(s): LIPASE, AMYLASE in the last 72 hours. Cardiac Enzymes No results for input(s): CKTOTAL, CKMB, CKMBINDEX, TROPONINI in the last 72 hours.  BNP: BNP (last 3 results) Recent Labs    03/19/2019 0113  BNP 655.7*    ProBNP (last 3 results) No results for input(s): PROBNP in the last 8760 hours.   D-Dimer No results for input(s): DDIMER in the last 72 hours. Hemoglobin A1C No results for input(s): HGBA1C in the last 72 hours. Fasting Lipid Panel No results for input(s): CHOL, HDL, LDLCALC, TRIG, CHOLHDL, LDLDIRECT in the last 72 hours. Thyroid Function Tests No results for input(s): TSH, T4TOTAL, T3FREE, THYROIDAB in the last 72 hours.  Invalid input(s): FREET3  Other results:   Imaging    No results found.   Medications:     Scheduled Medications: . aspirin EC  325 mg Oral Daily   Or  . aspirin  324 mg Per Tube Daily  . B-complex with vitamin C  1 tablet Per Tube Daily  . chlorhexidine gluconate (MEDLINE KIT)  15 mL Mouth Rinse BID  . Chlorhexidine Gluconate Cloth  6 each  Topical Q0600  . citalopram  10 mg Per Tube Daily  . collagenase   Topical Daily  . darbepoetin (ARANESP) injection - NON-DIALYSIS  100 mcg Subcutaneous Q Tue-1800  . docusate  200 mg Per Tube Daily  . feeding supplement (PRO-STAT SUGAR FREE 64)  30 mL Per Tube BID  . gabapentin  100 mg Oral Q8H  . insulin aspart  0-6 Units Subcutaneous Q4H  . mouth rinse  15 mL Mouth Rinse 10 times per day  . melatonin  3 mg Per Tube QHS  . metoCLOPramide (REGLAN) injection  10 mg Intravenous Q6H  . midodrine  20 mg Per Tube Q8H  . neomycin-polymyxin-hydrocortisone  3 drop Left EAR Q12H  . neostigmine  0.25 mg Subcutaneous Q6H  . pantoprazole sodium  40 mg Per Tube BID  . polyethylene glycol  17 g Oral Daily  . QUEtiapine  25 mg Oral QHS  . rOPINIRole  0.5 mg Oral QHS  . sennosides  5 mL Per Tube BID  . sodium chloride flush  10-40 mL Intracatheter Q12H    Infusions: .  prismasol BGK 4/2.5 500 mL/hr at 06/02/19 6606  .  prismasol BGK 4/2.5 300 mL/hr at 06/01/19 1351  . sodium chloride Stopped (05/29/19 1600)  . sodium chloride 1,000 mL (06/02/19 0744)  . sodium chloride Stopped (05/31/19 0958)  . dextrose 5 % and 0.9% NaCl Stopped (05/31/19 1626)  . DOBUTamine 2.5 mcg/kg/min (06/02/19 0700)  . feeding supplement (VITAL 1.5 CAL) 60 mL/hr at 06/02/19 0200  . norepinephrine (LEVOPHED) Adult infusion 5 mcg/min (06/02/19 0800)  . prismasol BGK 4/2.5 1,000 mL/hr at 06/02/19 0748    PRN Medications: Place/Maintain arterial line **AND** sodium chloride, sodium chloride, alum & mag hydroxide-simeth, dextrose, diphenhydrAMINE, fentaNYL (SUBLIMAZE) injection, Gerhardt's butt cream, heparin, heparin, levalbuterol, ondansetron (ZOFRAN) IV, oxyCODONE, pneumococcal 23 valent vaccine, sodium chloride flush, sorbitol     Assessment/Plan   1. Acute systolic HF ->  Cardiogenic Shock  - Due to iCM. EF 20-25% - Impella 5.5 placed on 4/2.   - s/p CABG/MVRepair on 4/6  - Impella out 4/9.  - Echo 4/10 with  EF 25-30%, normal RV, no MR.  - Echo 4/22 EF 30-35% mildly reduced RV MVR ok   - Dobutamine added 5/14 for low co-ox 39% and inability to wean NE.  - Has responded well to dobutamine 2.5. NE turned off this am. MAPs ok. Continue to follow closely. Hopefully can remain off NE.  - Repeat echo 05/29/19. EF 35-40% RV ok.  - Degree of cardiogenic shock seems out of proportion to LV/RV function. Unfortunately every time we try to wean NE his co-ox drops markedly and he has clearly become inotrope dependent. Now seems to be responding to dobutamine. Will monitor off NE today. Continue midodrine 20 mg tid    2. 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 Echo stable - No s/s ischemia - On ASA/Crestor  3. Acute hypoxic respiratory failure - Due to pulmonary edema and PNA - Extubated 4/9 re-intubated on 4/12. Failed re-extubation on 4/14. Now s/p tracheostomy - S/P Bronch 4/27 - 60K yeast. -> ? Colonization.  - CT scan 5/7 with diffuse bilateral infiltrates c/w ARDS and large bilateral effusions R>L.  - s/p R chest tube - Remains on vent FiO2 40% - CCM following. Hopefully can continue TC trials today  4. Mitral Regurgitation - Mod-severe on ECHO - s/p MV repair, TEE 4/9 with minimal MR s/p repair.   - Echo 4/10 with no significant MR.  - echo 4/22 MVR stable - Repeat echo 05/29/19. EF 35-40% RV ok.  MVR ok  5. Uncontrolled DM - Hgb A1C 9.  - On insulin and sliding scale.  - No change  6. AKI - due to shock - Remains anuric post-op - Continue CVVHD per Nephrology. CVP low. Plan is to keep net + to help w/ BP to allow NE wean.    7. Acute blood loss Anemia  - Transfused 1UPRBCs 5/9/21and 5/14 - hgb 7.9 today. Plan to give 1 unit of blood today  - No obvious site of bleeding.   8. Polymorphic VT - Recurrent VT during respiratory distress 4/14, - Off amio  - No further VT.  - Keep K> 4.0 Mg > 2.0  - Rhythm stable   9. Severe Malnutrition - Prealbumin  improved, up from 8.9>>18.7 - Nutrition on board - Continue w/ TFs - Likely needs PEG  10. Atrial fibrillation. paroxysmal - Remains in NSR.  - Amiodarone was stopped.   12. Debility, severe - as above will likely need LTAC on d/c  12. Chronic pain issues - appears comfortable - having severe constipation. CCM managing  - On Oxy q4 PRN. Titrate down slowly. Can we get to methadone?    Lyda Jester, PA-C  8:26 AM   Agree with above.   Remains on NE and dobutamine. NE up to 15 overnight due to low BP. Now giving back volume and NE down to 3. Co-ox improved on DBA. Remains anuric on CRRT. Was on vent all weekend. Hopefully can do TC trials today.   General:  Cachetic wesk appearing. No resp difficulty HEENT: normal + cor-trak Neck: supple. no JVD. + trach Carotids 2+ bilat; no bruits. No lymphadenopathy or thryomegaly appreciated. Cor: PMI nondisplaced. Regular rate & rhythm. No rubs, gallops or murmurs. + tunneled csath Lungs: coarse Abdomen: soft, nontender, nondistended. No hepatosplenomegaly. No bruits or masses. Good bowel sounds. Extremities: no cyanosis, clubbing, rash, edema Neuro: alert & orientedx3, cranial nerves grossly intact. moves all 4 extremities w/o difficulty. Affect pleasant  He  remains inotrope, vent and HD dependent. Also very weak and cachetic. Not sure there is a way out of this situation. Will keep even on CRRT to slightly positive. Continue DBA. Attempt to wean off NE today. Hopefully can get to [facility] trials.   CRITICAL CARE Performed by: Glori Bickers  Total critical care time: 35 minutes  Critical care time was exclusive of separately billable procedures and treating other patients.  Critical care was necessary to treat or prevent imminent or life-threatening deterioration.  Critical care was time spent personally by me (independent of midlevel providers or residents) on the following activities: development of treatment plan with patient  and/or surrogate as well as nursing, discussions with consultants, evaluation of patient's response to treatment, examination of patient, obtaining history from patient or surrogate, ordering and performing treatments and interventions, ordering and review of laboratory studies, ordering and review of radiographic studies, pulse oximetry and re-evaluation of patient's condition.  Glori Bickers, MD  10:31 PM

## 2019-06-03 ENCOUNTER — Inpatient Hospital Stay (HOSPITAL_COMMUNITY): Payer: Medicaid Other

## 2019-06-03 LAB — CBC
HCT: 26.4 % — ABNORMAL LOW (ref 39.0–52.0)
Hemoglobin: 8.2 g/dL — ABNORMAL LOW (ref 13.0–17.0)
MCH: 30.5 pg (ref 26.0–34.0)
MCHC: 31.1 g/dL (ref 30.0–36.0)
MCV: 98.1 fL (ref 80.0–100.0)
Platelets: 99 10*3/uL — ABNORMAL LOW (ref 150–400)
RBC: 2.69 MIL/uL — ABNORMAL LOW (ref 4.22–5.81)
RDW: 17.2 % — ABNORMAL HIGH (ref 11.5–15.5)
WBC: 6.6 10*3/uL (ref 4.0–10.5)
nRBC: 0 % (ref 0.0–0.2)

## 2019-06-03 LAB — RENAL FUNCTION PANEL
Albumin: 2.2 g/dL — ABNORMAL LOW (ref 3.5–5.0)
Albumin: 2.4 g/dL — ABNORMAL LOW (ref 3.5–5.0)
Anion gap: 1 — ABNORMAL LOW (ref 5–15)
Anion gap: 5 (ref 5–15)
BUN: 10 mg/dL (ref 8–23)
BUN: 13 mg/dL (ref 8–23)
CO2: 29 mmol/L (ref 22–32)
CO2: 26 mmol/L (ref 22–32)
Calcium: 7.5 mg/dL — ABNORMAL LOW (ref 8.9–10.3)
Calcium: 8 mg/dL — ABNORMAL LOW (ref 8.9–10.3)
Chloride: 107 mmol/L (ref 98–111)
Chloride: 105 mmol/L (ref 98–111)
Creatinine, Ser: 1.02 mg/dL (ref 0.61–1.24)
Creatinine, Ser: 1.13 mg/dL (ref 0.61–1.24)
GFR calc Af Amer: >60 mL/min (ref >60)
GFR calc Af Amer: >60 mL/min (ref >60)
GFR calc non Af Amer: >60 mL/min (ref >60)
GFR calc non Af Amer: >60 mL/min (ref >60)
Glucose, Bld: 113 mg/dL — ABNORMAL HIGH (ref 70–99)
Glucose, Bld: 121 mg/dL — ABNORMAL HIGH (ref 70–99)
Phosphorus: 2.2 mg/dL — ABNORMAL LOW (ref 2.5–4.6)
Phosphorus: 2.7 mg/dL (ref 2.5–4.6)
Potassium: 4.4 mmol/L (ref 3.5–5.1)
Potassium: 4.8 mmol/L (ref 3.5–5.1)
Sodium: 137 mmol/L (ref 135–145)
Sodium: 136 mmol/L (ref 135–145)

## 2019-06-03 LAB — TYPE AND SCREEN
ABO/RH(D): O NEG
Antibody Screen: NEGATIVE
Unit division: 0
Unit division: 0
Unit division: 0

## 2019-06-03 LAB — FUNGAL ORGANISM REFLEX

## 2019-06-03 LAB — BPAM RBC
Blood Product Expiration Date: 202105212359
Blood Product Expiration Date: 202105242359
Blood Product Expiration Date: 202106022359
ISSUE DATE / TIME: 202105141132
ISSUE DATE / TIME: 202105141953
ISSUE DATE / TIME: 202105170845
Unit Type and Rh: 9500
Unit Type and Rh: 9500
Unit Type and Rh: 9500

## 2019-06-03 LAB — FUNGUS CULTURE WITH STAIN

## 2019-06-03 LAB — GLUCOSE, CAPILLARY
Glucose-Capillary: 103 mg/dL — ABNORMAL HIGH (ref 70–99)
Glucose-Capillary: 104 mg/dL — ABNORMAL HIGH (ref 70–99)
Glucose-Capillary: 106 mg/dL — ABNORMAL HIGH (ref 70–99)
Glucose-Capillary: 110 mg/dL — ABNORMAL HIGH (ref 70–99)
Glucose-Capillary: 123 mg/dL — ABNORMAL HIGH (ref 70–99)
Glucose-Capillary: 79 mg/dL (ref 70–99)

## 2019-06-03 LAB — MAGNESIUM: Magnesium: 2.3 mg/dL (ref 1.7–2.4)

## 2019-06-03 LAB — COOXEMETRY PANEL
Carboxyhemoglobin: 2 % — ABNORMAL HIGH (ref 0.5–1.5)
Methemoglobin: 1.5 % (ref 0.0–1.5)
O2 Saturation: 60.3 %
Total hemoglobin: 8.7 g/dL — ABNORMAL LOW (ref 12.0–16.0)

## 2019-06-03 LAB — FUNGUS CULTURE RESULT

## 2019-06-03 MED ORDER — HEPARIN (PORCINE) IN NACL 2-0.9 UNITS/ML: INTRAMUSCULAR | Status: DC | PRN

## 2019-06-03 MED ORDER — VITAL 1.5 CAL PO LIQD
1000.0000 mL | ORAL | Status: DC
  Administered 2019-06-03: 1000 mL
  Filled 2019-06-03 (×3): qty 1000

## 2019-06-03 NOTE — Progress Notes (Signed)
Patient ID: Brendan [name], male   DOB: 03/07/1955, 64 y.o.   MRN: [id number] TCTS Evening Rounds:  Hemodynamically stable with low normal BP, MAP 60 on dobut 2.5 and NE 3  Sats 96% on vent 40% FiO2.  BMET    Component Value Date/Time   NA 136 06/03/2019 1518   K 4.8 06/03/2019 1518   CL 105 06/03/2019 1518   CO2 26 06/03/2019 1518   GLUCOSE 121 (H) 06/03/2019 1518   BUN 13 06/03/2019 1518   CREATININE 1.13 06/03/2019 1518   CALCIUM 8.0 (L) 06/03/2019 1518   GFRNONAA >60 06/03/2019 1518   GFRAA >60 06/03/2019 1518   CRRT going.

## 2019-06-03 NOTE — Progress Notes (Signed)
32 Days Post-Op Procedure(s) (LRB): VIDEO BRONCHOSCOPY USING DISPOSABLE ANESTHESIA SCOPE (N/A) TRACHEOSTOMY (N/A) Subjective: Sitting up in chair breathing on CPAP without difficulty Sinus rhythm Blood pressure maintained on 4 mcg norepinephrine Chest x-ray this a.m. with slight increase in edema  Objective: Vital signs in last 24 hours: Temp:  [97.5 F (36.4 C)-98.2 F (36.8 C)] 97.6 F (36.4 C) (05/17 2300) Pulse Rate:  [63-89] 76 (05/18 0742) Cardiac Rhythm: Normal sinus rhythm (05/18 0000) Resp:  [9-28] 24 (05/18 0742) BP: (77-118)/(40-74) 99/43 (05/18 0742) SpO2:  [90 %-100 %] 93 % (05/18 0742) Arterial Line BP: (66-138)/(37-81) 110/46 (05/18 0415) FiO2 (%):  [40 %] 40 % (05/18 0742) Weight:  [63.7 kg] 63.7 kg (05/18 0545)  Hemodynamic parameters for last 24 hours: CVP:  [6 mmHg-13 mmHg] 8 mmHg  Intake/Output from previous day: 05/17 0701 - 05/18 0700 In: 1757.7 [I.V.:612.7; Blood:315; NG/GT:830] Out: 397 [QBHAL PFXT:024] Intake/Output this shift: No intake/output data recorded.       Exam    General- alert and comfortable with trach in place    Neck- no JVD, no cervical adenopathy palpable, no carotid bruit   Lungs- clear without rales, wheezes   Cor- regular rate and rhythm, no murmur , gallop   Abdomen- soft, non-tender   Extremities - warm, non-tender, minimal edema   Neuro- oriented, appropriate, no focal weakness   Lab Results: Recent Labs    06/02/19 0445 06/03/19 0437  WBC 4.4 6.6  HGB 7.9* 8.2*  HCT 25.5* 26.4*  PLT 114* 99*   BMET:  Recent Labs    06/02/19 1511 06/03/19 0436  NA 137 137  K 5.0 4.4  CL 104 107  CO2 27 29  GLUCOSE 91 113*  BUN 11 10  CREATININE 1.11 1.02  CALCIUM 8.2* 7.5*    PT/INR: No results for input(s): LABPROT, INR in the last 72 hours. ABG    Component Value Date/Time   PHART 7.426 06/02/2019 0443   HCO3 26.3 06/02/2019 0443   TCO2 27 06/02/2019 0443   ACIDBASEDEF 3.0 (H) 04/28/2019 0735   O2SAT 60.3  06/03/2019 0436   CBG (last 3)  Recent Labs    06/02/19 2312 06/03/19 0443 06/03/19 0751  GLUCAP 144* 103* 79    Assessment/Plan: S/P Procedure(s) (LRB): VIDEO BRONCHOSCOPY USING DISPOSABLE ANESTHESIA SCOPE (N/A) TRACHEOSTOMY (N/A) Continue to wean norepinephrine to maintain MAP greater than 60 Remove brachial A-line which has been in place a week once norepinephrine requirement reduced to 2 mcg/min Transition to trach collar as tolerated then rest at night Continue CVVH without removing fluid today   LOS: 50 days    [name] 06/03/2019

## 2019-06-03 NOTE — Progress Notes (Signed)
Pt placed on previous full vent support setting to rest for the night.  Pt tolerating well

## 2019-06-03 NOTE — Progress Notes (Signed)
Occupational Therapy Treatment Patient Details Name: Brendan [name] MRN: [id number] DOB: [birth year] Today's Date: 06/03/2019    History of present illness Pt presented with SOB and fatigue at [facility] found to have new HFrEF,  NSTEMI, and bilateral pleural effusions transferred to [facility] on 3/29 for further cardiac evaluation.  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  On the evening of 4/1, patient developed worsening respiratory distress and hypoxia, head R/L heart cath on 3/30 & R heart cath on 4/1 pt intubated on 4/1, extubated 4/4, had R & L chest tubes placed 4/2 due to bilateral pleural effusions, removed 4/5 and impella device placed 4/2.  CABG and MVR on 4/6. Extubated 4/7. Removal of impella 4/9. CVVH started 4/10 and off 4/30. Respiratory distress and reintubated 4/12. Extubated 4/14. Reintubated 4/15. Trach 4/16.  CRRT off 4/29 then restarted 5/7. Trach collar 5/2 with return to vent 5/7. PMHx: Tobacco abuse, HTN, poorly controlled DM, diabetic neuropathy.   OT comments  Pt progressing to recliner and stedy use. RN in room to assist. Pt following 1 step commands and mouthing words occasionally. Pt resting head on stedy, but unable to sit upright without holding onto something. MaxA +2 from recliner sit to stand; modA +2 from stedy. Pt BP: 64/46 in standing with stedy taken from A-line- RN in room; BP after exertion: 106/46.  BUE HEP performed in sitting; pt very fatigued. O2 40% O2 5 PEEP >88% throughout exertion. OT following acutely. LTACH appears to be the best solution post acute care.     Follow Up Recommendations  LTACH    Equipment Recommendations  Other (comment)(defer)    Recommendations for Other Services      Precautions / Restrictions Precautions Precautions: Sternal;Fall Precaution Comments: trach on vent Restrictions Weight Bearing Restrictions: Yes       Mobility Bed Mobility Overal bed mobility: Needs Assistance             General bed mobility comments: in  recliner  Transfers Overall transfer level: Needs assistance Equipment used: 2 person hand held assist Transfers: Sit to/from [employer] Transfers Sit to Stand: Max assist;+2 safety/equipment;Mod assist         General transfer comment: MaxA +2 from recliner sit to stand; modA +2 from stedy.    Balance Overall balance assessment: Needs assistance Sitting-balance support: Feet supported Sitting balance-Leahy Scale: Poor Sitting balance - Comments: using stedy for stability Postural control: Left lateral lean Standing balance support: Bilateral upper extremity supported Standing balance-Leahy Scale: Poor Standing balance comment: use of stedy x2 times for sit to stand and then returned to recliner. Pt resting head on stedy, but unable to sit upright without holding onto something.                           ADL either performed or assessed with clinical judgement   ADL Overall ADL's : Needs assistance/impaired                                     Functional mobility during ADLs: Maximal assistance;+2 for physical assistance;Cueing for safety;Cueing for sequencing General ADL Comments: Pt performing BUE HEP with intermittent cues to stay alert.     Vision   Vision Assessment?: No apparent visual deficits   Perception     Praxis      Cognition Arousal/Alertness: Awake/alert Behavior During Therapy: Flat affect Overall Cognitive  Status: Difficult to assess Area of Impairment: Attention;Following commands                       Following Commands: Follows one step commands with increased time;Follows one step commands inconsistently       General Comments: Pt following 1 step commands and mouthing words occasionally.        Exercises General Exercises - Upper Extremity Shoulder Flexion: AAROM;Both;10 reps;Seated Elbow Flexion: AROM;Both;10 reps;Supine Elbow Extension: AROM;10 reps;Supine Wrist Flexion: AROM;10  reps;Both;Supine Wrist Extension: AROM;Both;10 reps;Supine Digit Composite Flexion: AROM;Both;10 reps;Supine Composite Extension: AROM;Both;Supine Other Exercises Other Exercises: hand squeezes   Shoulder Instructions       General Comments Pt BP: 64/46 in standing with stedy taken from A-line- RN in room; BP after exertion: 106/46.  O2 40% O2 5 PEEP >88% throughout exertion.    Pertinent Vitals/ Pain       Pain Assessment: Faces Faces Pain Scale: Hurts a little bit Pain Location: generalized Pain Descriptors / Indicators: Grimacing Pain Intervention(s): Monitored during session;Premedicated before session  Home Living                                          Prior Functioning/Environment              Frequency  Min 2X/week        Progress Toward Goals  OT Goals(current goals can now be found in the care plan section)  Progress towards OT goals: Progressing toward goals  Acute Rehab OT Goals Patient Stated Goal: not stated OT Goal Formulation: Patient unable to participate in goal setting Time For Goal Achievement: 06/09/19 Potential to Achieve Goals: Good ADL Goals Pt Will Perform Grooming: with supervision;sitting Pt Will Perform Lower Body Dressing: with mod assist;with adaptive equipment;sitting/lateral leans;sit to/from stand Pt/caregiver will Perform Home Exercise Program: Increased strength;Both right and left upper extremity;With Supervision Additional ADL Goal #1: Pt  will verbalize 3 strategies to conserve energy during functional ADL and mobility tasks with no verbal cues Additional ADL Goal #2: Pt will increase to minA overall for bed mobility as precursor for OOB ADL. Additional ADL Goal #3: Pt will be able to verbalize/abide by sternal precautions with minimal cues required throughout session.  Plan Discharge plan remains appropriate    Co-evaluation                 AM-PAC OT "6 Clicks" Daily Activity     Outcome  Measure   Help from another person eating meals?: Total Help from another person taking care of personal grooming?: A Lot Help from another person toileting, which includes using toliet, bedpan, or urinal?: Total Help from another person bathing (including washing, rinsing, drying)?: Total Help from another person to put on and taking off regular upper body clothing?: A Lot Help from another person to put on and taking off regular lower body clothing?: Total 6 Click Score: 8    End of Session Equipment Utilized During Treatment: Oxygen;Gait belt  OT Visit Diagnosis: Unsteadiness on feet (R26.81);Muscle weakness (generalized) (M62.81);Other symptoms and signs involving cognitive function Pain - part of body: (all over)   Activity Tolerance Treatment limited secondary to medical complications (Comment);Patient limited by fatigue;Patient limited by lethargy   Patient Left in chair;with call bell/phone within reach;with chair alarm set   Nurse Communication Mobility status  Time: 7001-7494 OT Time Calculation (min): 20 min  Charges: OT General Charges $OT Visit: 1 Visit OT Treatments $Therapeutic Activity: 8-22 mins  Jefferey Pica, OTR/L Acute Rehabilitation Services Pager: [phone] Office: [phone]    Sherra Kimmons C 06/03/2019, 5:08 PM

## 2019-06-03 NOTE — Progress Notes (Signed)
Nutrition Follow-up  DOCUMENTATION CODES:   Severe malnutrition in context of chronic illness, Underweight  INTERVENTION:  Recommend considering PEG tube placement for long term nutrition support. However, given nationwide shortage of PEG tubes, it may be a while before pt could have one placed.   Tube Feeding via Cortrak:  Change to Vital 1.5 at 60 ml/hr Pro-Stat 30 mL BID Provides 127 g of protein, 2360 kcals, 1094 mL of free water Meets 100% estimated calorie and protein needs  Continue B-complex with C    NUTRITION DIAGNOSIS:   Severe Malnutrition related to chronic illness as evidenced by severe muscle depletion, severe fat depletion.  Ongoing.  GOAL:   Patient will meet greater than or equal to 90% of their needs  Met with TF.  MONITOR:   Vent status, Diet advancement, Labs, Weight trends, TF tolerance  REASON FOR ASSESSMENT:   Consult Poor PO, Assessment of nutrition requirement/status  ASSESSMENT:   64 yo male admitted with acute systeolic CHF, CAD with 3 vessel disease with plan for CABG. PMH includes DM with HgbA1c 9 with neuropathy, HTN  3/30 Cardiac Cath with severe 3-vessel CAD 3/31 ECHO: EF 30-35% 4/01 IABP placed, Intubated 4/02 Impella placed, bilateral CT placed for pleural effusions 4/04 Extubated 4/06 CABG, MV repair, Intubated 4/07 Extubated 4/09 Impella removed, LVAD placed, Cortrak placed (gastric), TF initiated 4/10 CRRT initiated 4/12 Re-intubated 4/14 Extubated 4/15 Re-intubated 4/16 Trach, Bronchoscopy 4/27 Bronch 4/29 CRRT discontinued 4/30 off vent, TC x 24 hours 5/01 1st iHD 5/07 back on CRRT and vent support   Pt remains on vent and CRRT. Pt remains anuric. Per nephrologist, will continue CRRT as long as pt has no UOP and requires vasopressors. Pt undergoing pressure support trials as tolerated and possible trach collar trial as tolerated, though MD noted pt will likely need continued ventilator at night.   Current tube  feeding orders via Cortrak: 58m Pro-stat BID, Vital 1.5 cal @ 471mhr  Discussed with RN who is unsure why tube feeding was reduced yesterday, but suspects it was due to pt being nauseated. Pt denies nausea today.   Labs reviewed: Phosphorus 2.2 (L), CBGs 79-144 Medications reviewed and include: B-complex with vitamin C, Aranesp, Colace, Novolog, Reglan, Miralax, Senokot Drips: dobutrex, Levophed  Patient is currently intubated on ventilator support MV: 8.8 L/min Temp (24hrs), Avg:97.8 F (36.6 C), Min:97.5 F (36.4 C), Max:98.2 F (36.8 C)    Diet Order:   Diet Order    None      EDUCATION NEEDS:   Education needs have been addressed  Skin:  Skin Assessment: Skin Integrity Issues: Skin Integrity Issues:: Stage II, Stage III, Incisions DTI: Coccyx Stage II: perineum Stage III: coccyx Incisions: R axilla, chest, bilateral legs  Last BM:  5/17 type 4  Height:   Ht Readings from Last 1 Encounters:  04/24/2019 5' 11"  (1.803 m)    Weight:   Wt Readings from Last 1 Encounters:  06/03/19 63.7 kg   BMI:  Body mass index is 19.59 kg/m.  Estimated Nutritional Needs:   Kcal:  2100-2300 kcals  Protein:  120-135 g  Fluid:  >/= 1.8 L/day    AmLarkin InaMS, RD, LDN RD pager number and weekend/on-call pager number located in [phone]

## 2019-06-03 NOTE — Progress Notes (Signed)
[facility] Progress Note   Admitted to [place] on 3/26 with SOB, new low EF and NSTEMI txd to [facility] 3/29: 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 Admission Scr was 0.99.  Assessment/ Plan:   1. Acute hypoxic respiratory failure on 05/23/19- on vent via trach. S/p right chest tube. Ventilation status unchanged. 2. Oliguric/anuricAKI- CRRT started on 4/10 and stopped 4/29 w/ attempted transition to IHD on 05/17/19 but failed and now back on CRRT 1. Remains anuric. 2. Resumed CRRT on 05/23/19 following acute worsening of his oxygenation and hypotension.  3. Dialysate 4K/2.5 Ca for replacement fluids and dialysate. no heparin.500/300/1000 pre/post/dialysate.  4. OK to be 50ml pos/hr to help wean pressors; BP is low. 5. Will keep on CRRT as long as no UOP and req pressors 3. Vascular access- LIJ tunneled  catheter placed by IR on 05/19/19 (actually already tunneled) ; will call and cancel order.  4. Anemia of CKD, multifactorial-transfusions per primary team. Hemoglobin acceptable today. Darbo 100mcg started 5/11. CTM and consider uptitrating as needed 5. CAD- 3 vessel with EF 25% s/p CABG and MVR 05/13/2019 complicated by cardiogenic shock as below 6. Acute systolic CHF and cardiogenic shock- still on pressors. Volume improved with CRRT/IHD. On pretty max midodrine as well, unlike to benefit from higher dose. 7. Elytes-  Monitor phos daily. K acceptable today.  8. MR s/p MVR 9. Severe debility- will likely need LTAC if survives. GOC meeting possibly in the works  Subjective:   Slightly diminished nor epi requirement over the last 24 hours. Overall unchanged and no acute issues overnight. Dobutamine 2.5 and Levo 5; Levo titrated as low as 3mcg this am but back up to 5.    Objective:   BP (!) 93/54   Pulse 89   Temp 97.6 F (36.4 C) (Oral)   Resp (!) 25   Ht 5' 11"  (1.803 m) Comment: measured x 3  Wt 63.7 kg   SpO2 96%   BMI 19.59 kg/m   Intake/Output Summary (Last 24 hours) at 06/03/2019 0738 Last data filed at 06/03/2019 0700 Gross per 24 hour  Intake 1757.69 ml  Output 589 ml  Net 1168.69 ml   Weight change: 1.4 kg  Physical Exam: Gen: frail, thin, cachetic, alert Neck: lef IJ  Tunneled cath (placed 5/3) CVS: normal rate Resp:Coarse bilateral breath sounds, ventilated/trached Abd: +BS, soft, NT/ND Ext: Trace bilateral lower extremity edema at the ankle- atrophied legs  Imaging: No results found.  Labs: BMET Recent Labs  Lab 05/31/19 0453 05/31/19 0453 05/31/19 1522 05/31/19 1522 06/01/19 0417 06/01/19 0417 06/01/19 0419 06/01/19 0707 06/01/19 1456 06/02/19 0443 06/02/19 0445 06/02/19 1511 06/03/19 0436  NA 137   < > 139   < > 136   < > 140 137 136 140 136 137 137  K 4.8   < > 4.7   < > 4.4   < > 4.6 4.6 4.4 4.3 4.6 5.0 4.4  CL 104  --  105  --  105  --   --   --  104  --  106 104 107  CO2 24  --  23  --  25  --   --   --  26  --  27 27 29  GLUCOSE 91  --  134*  --  119*  --   --   --  123*  --    172* 91 113*  BUN 12  --  13  --  12  --   --   --  10  --  12 11 10  CREATININE 0.81  --  1.14  --  1.14  --   --   --  1.13  --  1.14 1.11 1.02  CALCIUM 8.2*  --  8.1*  --  8.0*  --   --   --  8.3*  --  7.9* 8.2* 7.5*  PHOS 2.0*  --  3.3  --  1.9*  --   --   --  3.8  --  3.1 3.1 2.2*   < > = values in this interval not displayed.   CBC Recent Labs  Lab 05/31/19 0453 05/31/19 0453 06/01/19 0417 06/01/19 0419 06/01/19 0707 06/02/19 0443 06/02/19 0445 06/03/19 0437  WBC 8.2  --  6.2  --   --   --  4.4 6.6  HGB 8.4*   < > 8.1*   < > 7.8* 7.5* 7.9* 8.2*  HCT 26.8*   < > 25.3*   < > 23.0* 22.0* 25.5* 26.4*  MCV 94.4  --  95.1  --   --   --  97.3 98.1  PLT 104*  --  104*  --   --   --  114* 99*   < > = values in this interval not displayed.    Medications:    . aspirin EC  325 mg Oral Daily   Or  . aspirin  324 mg  Per Tube Daily  . B-complex with vitamin C  1 tablet Per Tube Daily  . chlorhexidine gluconate (MEDLINE KIT)  15 mL Mouth Rinse BID  . Chlorhexidine Gluconate Cloth  6 each Topical Q0600  . citalopram  10 mg Per Tube Daily  . collagenase   Topical Daily  . darbepoetin (ARANESP) injection - NON-DIALYSIS  100 mcg Subcutaneous Q Tue-1800  . docusate  200 mg Per Tube Daily  . feeding supplement (PRO-STAT SUGAR FREE 64)  30 mL Per Tube BID  . gabapentin  100 mg Oral Q8H  . insulin aspart  0-6 Units Subcutaneous Q4H  . mouth rinse  15 mL Mouth Rinse 10 times per day  . melatonin  3 mg Per Tube QHS  . metoCLOPramide (REGLAN) injection  10 mg Intravenous Q6H  . midodrine  20 mg Per Tube Q8H  . neostigmine  0.25 mg Subcutaneous Q6H  . pantoprazole sodium  40 mg Per Tube BID  . polyethylene glycol  17 g Oral Daily  . QUEtiapine  25 mg Oral QHS  . rOPINIRole  0.5 mg Oral QHS  . sennosides  5 mL Per Tube BID  . sodium chloride flush  10-40 mL Intracatheter Q12H       , MD 06/03/2019, 7:38 AM   

## 2019-06-03 NOTE — Progress Notes (Signed)
Physical Therapy Treatment Patient Details Name: Brendan [name] MRN: [id number] DOB: [birth year] Today's Date: 06/03/2019    History of Present Illness Pt presented with SOB and fatigue at [facility] found to have new HFrEF,  NSTEMI, and bilateral pleural effusions transferred to [facility] on 3/29 for further cardiac evaluation.  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  On the evening of 4/1, patient developed worsening respiratory distress and hypoxia, head R/L heart cath on 3/30 & R heart cath on 4/1 pt intubated on 4/1, extubated 4/4, had R & L chest tubes placed 4/2 due to bilateral pleural effusions, removed 4/5 and impella device placed 4/2.  CABG and MVR on 4/6. Extubated 4/7. Removal of impella 4/9. CVVH started 4/10 and off 4/30. Respiratory distress and reintubated 4/12. Extubated 4/14. Reintubated 4/15. Trach 4/16.  CRRT off 4/29 then restarted 5/7. Trach collar 5/2 with return to vent 5/7. PMHx: Tobacco abuse, HTN, poorly controlled DM, diabetic neuropathy.    PT Comments    Pt demonstrates improvement through ability tolerate sitting EOB and transfering to chair during session. Pt required mod(A) bed mobility, mod(A)+2 sit to stand and max(A) +2 squat pivot transfer to chair. Nurse assisted with line management during session. Will continue to follow acutely.   BP after transfer 93/43   Follow Up Recommendations  LTACH     Equipment Recommendations  None recommended by PT    Recommendations for Other Services       Precautions / Restrictions Precautions Precautions: Sternal;Fall Precaution Comments: trach on vent, CRRT    Mobility  Bed Mobility Overal bed mobility: Needs Assistance Bed Mobility: Supine to Sit     Supine to sit: Mod assist;HOB elevated     General bed mobility comments: pt able to move bil LE to EOB, required mod assistance for trunk elevation  Transfers Overall transfer level: Needs assistance Equipment used: 2 person hand held assist Transfers: Sit to/from  [employer] Sit to Stand: Mod assist;+2 safety/equipment   Squat pivot transfers: Max assist;+2 safety/equipment     General transfer comment: Pt mod(A) +2 sit to stand x1 with bil hand held assistance. Pt required max(A) +2 to squat pivot to chair due to RLE giving out.  Ambulation/Gait             General Gait Details: unable   Stairs             Wheelchair Mobility    Modified Rankin (Stroke Patients Only)       Balance Overall balance assessment: Needs assistance Sitting-balance support: Feet supported Sitting balance-Leahy Scale: Poor Sitting balance - Comments: rounded shoulders with assist to sit without trunk support, fatigues quickly   Standing balance support: Bilateral upper extremity supported Standing balance-Leahy Scale: Poor Standing balance comment: physical assist for knee blocking (RLE>LLE) and pelvis support with chuck pad for standing with bil UE on therapist arms.                            Cognition Arousal/Alertness: Awake/alert Behavior During Therapy: Flat affect Overall Cognitive Status: Difficult to assess Area of Impairment: Attention;Following commands                       Following Commands: Follows one step commands with increased time;Follows one step commands inconsistently       General Comments: Pt able to follow 1 step commands with increased time with bed mobility and transfer. Pt required  verbal cues for movement and pt was able to use head movements to communicate with yes/no questions.      Exercises      General Comments General comments (skin integrity, edema, etc.): Pt BP 93/43 at end of session. Noted healed wounds on arms, legs and chest WNL. RN placed on full vent support for recovery post-transfer.      Pertinent Vitals/Pain Pain Assessment: Faces Faces Pain Scale: Hurts little more Pain Location: generalized Pain Descriptors / Indicators: Grimacing Pain  Intervention(s): Limited activity within patient's tolerance;Monitored during session;Repositioned    Home Living                      Prior Function            PT Goals (current goals can now be found in the care plan section) Acute Rehab PT Goals Patient Stated Goal: not stated PT Goal Formulation: With patient Time For Goal Achievement: 06/17/19 Potential to Achieve Goals: Good Progress towards PT goals: Progressing toward goals    Frequency    Min 2X/week      PT Plan Current plan remains appropriate    Co-evaluation              AM-PAC PT "6 Clicks" Mobility   Outcome Measure  Help needed turning from your back to your side while in a flat bed without using bedrails?: A Lot Help needed moving from lying on your back to sitting on the side of a flat bed without using bedrails?: A Lot Help needed moving to and from a bed to a chair (including a wheelchair)?: Total Help needed standing up from a chair using your arms (e.g., wheelchair or bedside chair)?: Total Help needed to walk in hospital room?: Total Help needed climbing 3-5 steps with a railing? : Total 6 Click Score: 8    End of Session Equipment Utilized During Treatment: Gait belt;Oxygen Activity Tolerance: Treatment limited secondary to medical complications (Comment);Patient limited by fatigue Patient left: in chair;with nursing/sitter in room Nurse Communication: Mobility status(Nurse assisted with management of lines) PT Visit Diagnosis: Other abnormalities of gait and mobility (R26.89);Muscle weakness (generalized) (M62.81)     Time: 9450-3888 PT Time Calculation (min) (ACUTE ONLY): 16 min  Charges:  $Therapeutic Activity: 8-22 mins                     [employer] SPT 06/03/2019    Rolland Porter 06/03/2019, 12:01 PM

## 2019-06-03 NOTE — Progress Notes (Addendum)
Patient ID: Brendan [name], male   DOB: 09/30/1955, 64 y.o.   MRN: [id number]     Advanced Heart Failure Rounding Note  PCP-Cardiologist: No primary care provider on file.   Subjective:    Remains on vent through TC and CRRT  Dobutamine 2.5 added for low co-ox and inability to wean NE.   Tolerating Dobutamine ok. No arrhthymias. Remains on NE 4. Co-ox 60%. CVP 8.   Currently out of bed and working w/ PT. Looks fatigued.   Hgb up from 7.5>>8.2 post transfusion yesterday. Received 1 unit.   Objective:   Weight Range: 63.7 kg Body mass index is 19.59 kg/m.   Vital Signs:   Temp:  [97.5 F (36.4 C)-98.2 F (36.8 C)] 97.6 F (36.4 C) (05/17 2300) Pulse Rate:  [63-89] 76 (05/18 0742) Resp:  [9-28] 24 (05/18 0742) BP: (77-123)/(40-76) 99/43 (05/18 0742) SpO2:  [90 %-100 %] 93 % (05/18 0742) Arterial Line BP: (66-147)/(37-81) 110/46 (05/18 0415) FiO2 (%):  [40 %] 40 % (05/18 0742) Weight:  [63.7 kg] 63.7 kg (05/18 0545) Last BM Date: 06/02/19  Weight change: Filed Weights   06/01/19 0600 06/02/19 0400 06/03/19 0545  Weight: 63 kg 62.3 kg 63.7 kg    Intake/Output:   Intake/Output Summary (Last 24 hours) at 06/03/2019 0941 Last data filed at 06/03/2019 0700 Gross per 24 hour  Intake 1610.41 ml  Output 495 ml  Net 1115.41 ml      Physical Exam   CVP 8 General:  Fatigued looking/cachetic WM sitting up in chair On vent through trach.   HEENT: + cor trak  Neck: supple. JVD ~8 cm. + TC Carotids 2+ bilat; no bruits. No lymphadenopathy or thryomegaly appreciated. + Lt IJ tunnel cath  Cor: PMI nondisplaced. Regular rate & rhythm. No rubs, gallops or murmurs. Lungs: coarse bilaterally  Abdomen: soft, nontender, nondistended. No hepatosplenomegaly. No bruits or masses. Good bowel sounds. Extremities: no cyanosis, clubbing, rash, thin extremities 2+ ankle edema, +RUE PICC Neuro: lethargic but awakens. Nonfocal  Telemetry   NSR 70-80s Personally reviewed   Labs      CBC Recent Labs    06/02/19 0445 06/03/19 0437  WBC 4.4 6.6  HGB 7.9* 8.2*  HCT 25.5* 26.4*  MCV 97.3 98.1  PLT 114* 99*   Basic Metabolic Panel Recent Labs    06/02/19 0445 06/02/19 0445 06/02/19 1511 06/03/19 0436 06/03/19 0437  NA 136   < > 137 137  --   K 4.6   < > 5.0 4.4  --   CL 106   < > 104 107  --   CO2 27   < > 27 29  --   GLUCOSE 172*   < > 91 113*  --   BUN 12   < > 11 10  --   CREATININE 1.14   < > 1.11 1.02  --   CALCIUM 7.9*   < > 8.2* 7.5*  --   MG 2.3  --   --   --  2.3  PHOS 3.1   < > 3.1 2.2*  --    < > = values in this interval not displayed.   Liver Function Tests Recent Labs    06/02/19 1511 06/03/19 0436  ALBUMIN 2.6* 2.2*   No results for input(s): LIPASE, AMYLASE in the last 72 hours. Cardiac Enzymes No results for input(s): CKTOTAL, CKMB, CKMBINDEX, TROPONINI in the last 72 hours.  BNP: BNP (last 3 results) Recent Labs  03/18/2019 0113  BNP 655.7*    ProBNP (last 3 results) No results for input(s): PROBNP in the last 8760 hours.   D-Dimer No results for input(s): DDIMER in the last 72 hours. Hemoglobin A1C No results for input(s): HGBA1C in the last 72 hours. Fasting Lipid Panel No results for input(s): CHOL, HDL, LDLCALC, TRIG, CHOLHDL, LDLDIRECT in the last 72 hours. Thyroid Function Tests No results for input(s): TSH, T4TOTAL, T3FREE, THYROIDAB in the last 72 hours.  Invalid input(s): FREET3  Other results:   Imaging    DG Chest Port 1 View  Result Date: 06/03/2019 CLINICAL DATA:  History of recent open heart surgery with persistent shortness of breath. EXAM: PORTABLE CHEST 1 VIEW COMPARISON:  06/01/2019 FINDINGS: The tracheostomy tube is stable. The feeding tube is stable. The right PICC line and left IJ central venous catheters are stable. Stable right-sided chest tube without definite right-sided pneumothorax. Persistent diffuse interstitial and airspace process in the lungs. No large pleural effusions.  IMPRESSION: 1. Stable support apparatus. 2. Persistent diffuse interstitial and airspace process. Electronically Signed   By: Marijo Sanes M.D.   On: 06/03/2019 07:58     Medications:     Scheduled Medications: . aspirin EC  325 mg Oral Daily   Or  . aspirin  324 mg Per Tube Daily  . B-complex with vitamin C  1 tablet Per Tube Daily  . chlorhexidine gluconate (MEDLINE KIT)  15 mL Mouth Rinse BID  . Chlorhexidine Gluconate Cloth  6 each Topical Q0600  . citalopram  10 mg Per Tube Daily  . collagenase   Topical Daily  . darbepoetin (ARANESP) injection - NON-DIALYSIS  100 mcg Subcutaneous Q Tue-1800  . docusate  200 mg Per Tube Daily  . feeding supplement (PRO-STAT SUGAR FREE 64)  30 mL Per Tube BID  . gabapentin  100 mg Oral Q8H  . insulin aspart  0-6 Units Subcutaneous Q4H  . mouth rinse  15 mL Mouth Rinse 10 times per day  . melatonin  3 mg Per Tube QHS  . metoCLOPramide (REGLAN) injection  10 mg Intravenous Q6H  . midodrine  20 mg Per Tube Q8H  . neostigmine  0.25 mg Subcutaneous Q6H  . pantoprazole sodium  40 mg Per Tube BID  . polyethylene glycol  17 g Oral Daily  . QUEtiapine  25 mg Oral QHS  . rOPINIRole  0.5 mg Oral QHS  . sennosides  5 mL Per Tube BID  . sodium chloride flush  10-40 mL Intracatheter Q12H    Infusions: .  prismasol BGK 4/2.5 500 mL/hr at 06/03/19 0054  .  prismasol BGK 4/2.5 300 mL/hr at 06/03/19 0043  . sodium chloride Stopped (05/29/19 1600)  . sodium chloride 10 mL/hr at 06/02/19 1500  . sodium chloride Stopped (05/31/19 0958)  . dextrose 5 % and 0.9% NaCl Stopped (06/02/19 1047)  . DOBUTamine 2.5 mcg/kg/min (06/03/19 0700)  . feeding supplement (VITAL 1.5 CAL)    . heparin    . norepinephrine (LEVOPHED) Adult infusion 4 mcg/min (06/03/19 0700)  . prismasol BGK 4/2.5 1,000 mL/hr at 06/03/19 0430    PRN Medications: Place/Maintain arterial line **AND** sodium chloride, sodium chloride, alum & mag hydroxide-simeth, dextrose, diphenhydrAMINE,  fentaNYL (SUBLIMAZE) injection, Gerhardt's butt cream, heparin, heparin, levalbuterol, ondansetron (ZOFRAN) IV, oxyCODONE, pneumococcal 23 valent vaccine, sodium chloride flush, sorbitol     Assessment/Plan   1. Acute systolic HF ->  Cardiogenic Shock  - Due to iCM. EF 20-25% - Impella 5.5 placed  on 4/2.   - s/p CABG/MVRepair on 4/6  - Impella out 4/9.  - Echo 4/10 with EF 25-30%, normal RV, no MR.  - Echo 4/22 EF 30-35% mildly reduced RV MVR ok   - Dobutamine added 5/14 for low co-ox 39% and inability to wean NE.  - Has responded well to dobutamine 2.5. Remains on NE 4. Co-ox 60% - Repeat echo 05/29/19. EF 35-40% RV ok.  - Degree of cardiogenic shock seems out of proportion to LV/RV function. Unfortunately every time we try to wean NE his co-ox drops markedly and he has clearly become inotrope dependent. Now seems to be responding to dobutamine. Continue midodrine 20 mg tid  - Add TED hoses to help w/ edema    2. 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 Echo stable - No s/s ischemia - On ASA/Crestor  3. Acute hypoxic respiratory failure - Due to pulmonary edema and PNA - Extubated 4/9 re-intubated on 4/12. Failed re-extubation on 4/14. Now s/p tracheostomy - S/P Bronch 4/27 - 60K yeast. -> ? Colonization.  - CT scan 5/7 with diffuse bilateral infiltrates c/w ARDS and large bilateral effusions R>L.  - s/p R chest tube - Remains on vent FiO2 40% - CCM following. Hopefully can continue TC trials today  4. Mitral Regurgitation - Mod-severe on ECHO - s/p MV repair, TEE 4/9 with minimal MR s/p repair.   - Echo 4/10 with no significant MR.  - echo 4/22 MVR stable - Repeat echo 05/29/19. EF 35-40% RV ok.  MVR ok  5. Uncontrolled DM - Hgb A1C 9.  - On insulin and sliding scale.  - No change  6. AKI - due to shock - Remains anuric post-op - Continue CVVHD per Nephrology. Plan is to keep slightly net + to help w/ BP to allow NE wean.    7. Acute  blood loss Anemia  - Transfused 1UPRBCs 5/9/21and 5/14 and 5/17 - hgb 8.2 today.  - No obvious site of bleeding.   8. Polymorphic VT - Recurrent VT during respiratory distress 4/14, - Off amio  - No further VT.  - Keep K> 4.0 Mg > 2.0  - Rhythm stable   9. Severe Malnutrition - Prealbumin improved, up from 8.9>>18.7 - Nutrition on board - Continue w/ TFs - Likely needs PEG  10. Atrial fibrillation. paroxysmal - Remains in NSR.  - Amiodarone was stopped.   55. Debility, severe - as above will likely need LTAC on d/c - Continue PT/OT  12. Chronic pain issues - appears comfortable - having severe constipation. CCM managing  - On Oxy q4 PRN. Titrate down slowly. Can we get to methadone?    Lyda Jester, PA-C  9:41 AM  Agree with above  Remains pressor dependent on NE4 and dobutamine 2.5. Anuric. Remains on CVVHD. CXR slightly worse this am. Remains on vent through trach. Trying to work with PT/OT but very weak.   General:  Ill appearing. cachetic HEENT: normal + cor-trak Neck: supple. no JVD. Carotids 2+ bilat; no bruits. No lymphadenopathy or thryomegaly appreciated. Cor: PMI nondisplaced. Regular rate & rhythm. No rubs, gallops or murmurs. + tunneled cath Lungs: coarse  Abdomen: soft, nontender, nondistended. No hepatosplenomegaly. No bruits or masses. Good bowel sounds. Extremities: no cyanosis, clubbing, rash, edema Neuro: alert cranial nerves grossly intact. moves all 4 extremities w/o difficulty. Affect flat  Continues to be pressor, vend and CVVHD dependent. Continue dobutamine. Continue attempts to  wean NE and vent.   Family still wanting aggressive care but if cannt wean pressors by end of the weak will need to revisit.   CRITICAL CARE Performed by: Glori Bickers  Total critical care time: 35 minutes  Critical care time was exclusive of separately billable procedures and treating other patients.  Critical care was necessary to treat or prevent  imminent or life-threatening deterioration.  Critical care was time spent personally by me (independent of midlevel providers or residents) on the following activities: development of treatment plan with patient and/or surrogate as well as nursing, discussions with consultants, evaluation of patient's response to treatment, examination of patient, obtaining history from patient or surrogate, ordering and performing treatments and interventions, ordering and review of laboratory studies, ordering and review of radiographic studies, pulse oximetry and re-evaluation of patient's condition.  Glori Bickers, MD  8:36 PM

## 2019-06-03 NOTE — Progress Notes (Addendum)
NAME:  Brendan [name], MRN:  [id number], DOB:  [birth year], LOS: 45 ADMISSION DATE:  03/22/2019, CONSULTATION DATE:  04/17/19 REFERRING MD:  Dr. Haroldine Laws, CHIEF COMPLAINT:  Respiratory distress   Brief History   44 yoM originally presented with SOB and fatigue at [facility] found to have new HFrEF, NSTEMI, and bilateral pleural effusions transferred to [address] [facility] on 3/29 for further cardiac evaluation. 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 Started on lasix and milrinone gtts however developed NSVT. Was taken 4/1 for placement of IABP and swan for optimization prior to CABG. Was on precedex for agitation/ confusion, some concern for DTs. On the evening of 4/1, patient developed worsening respiratory distress and hypoxia, PCCM consulted for intubation and vent management.  Patient was extubated on 4/14 and developed worsening hypoxia on BiPAP. Overnight he developed pulseless VT requiring 1 minute of CPR and epinephrine to achieve ROSC. Neurovascularly intact afterward. Subsequently had increased work of breathing and worsening respiratory distress requiring intubation. Patient had tracheostomy performed on 05/01/2019. Trying to wean off ventilator.   Past Medical History  Tobacco abuse, HTN, poorly controlled diabetes, diabetic neuropathy  Significant Hospital Events   3/30 Lt heart cath/TEE performed 4/1 Intubated, RHC/IABP/PA cath 4/2 Impella placement 4/3 febrile over evening and night. Blood cultures obtained. Vanc/cefepime started. Hematuria--heparin held. 4/4 Extubated,abx transitioned to rocephin. Heparin restarted 4/6 Re-intubated for CABG/MVR 4/7 Extubated from CABG to BiPAP 4/9 Impella removed  4/9 TEE 4/10 CVC inserted &CRRT initiated  4/12 PCCMre-consulted re-intubation 4/14 Extubated to BiPAP 4/15 Re-intubated due to respiratory distress 4/16 Tracheostomy 4/27 bronch with BAL 5/7 called back for hypercarbic failure. #6 cuffed placed. Right chest tube placed for enlarging  effusion. 5/15-18 remains on vent, CVVHD  Consults:  TCTS PCCM HF Nephrology  Procedures:  R cephalic double lumen PICC LIJ tunneled HD catheter L brachial arterial line 6-0 cuffed shiley  Significant Diagnostic Tests:  3/30 R/ LHC >>  Ost LM to Mid LM lesion is 35% stenosed.  Prox LAD lesion is 90% stenosed.  Prox LAD to Mid LAD lesion is 50% stenosed.  Mid Cx lesion is 100% stenosed.  Prox RCA to Mid RCA lesion is 90% stenosed.  RPDA lesion is 95% stenosed.  LV end diastolic pressure is severely elevated.  Hemodynamic findings consistent with moderate pulmonary hypertension. 1. Severe 3 vessel obstructive CAD 2. High LV filling pressures 3. Reduced cardiac output with index 2.3 4. Moderate pulmonary HTN.  3/30TTE >> 1. Left ventricular ejection fraction, by estimation, is 30 to 35%. The left ventricle has moderately decreased function. The left ventricle demonstrates regional wall motion abnormalities.There is mild left ventricular hypertrophy. Left ventricular diastolic function could not be evaluated. There is severe hypokinesis of the left ventricular, entire inferior wall, inferoseptal wall, apical segment and lateral wall.  2. Right ventricular systolic function is hyperdynamic. The right ventricular size is normal. There is moderately elevated pulmonary artery systolic pressure.  3. Decreased posterior leaflet motion due to ischemic tethering of the mitral valve leaflets.  4. The mitral valve is abnormal. Moderate to severe mitral valve regurgitation.  5. The aortic valve is tricuspid. Aortic valve regurgitation is not visualized. Mild aortic valve sclerosis is present, with no evidence of aortic valve stenosis.  6. The inferior vena cava is normal in size with greater than 50% respiratory variability, suggesting right atrial pressure of 3 mmHg.   4/1 CT chest w/o >>Large bilateral pleural effusions with associated atelectasis.Moderate to severe  bilateral ground-glass opacities, likely reflecting pneumonia and/or edema.  4/1 RHC >> RA = 18 RV = 60/20  PA = 63/29 (42) PCW = 33 Fick cardiac output/index = 3.7/1.9 PVR = 2.5 WU FA sat = 98% PA sat = 47%, 49% PaPi = 2.2   Micro Data:  5/7 R pleural fluid neg 5/7 blood neg to date 5/6 tracheal aspirate enterobacter  Antimicrobials:    Interim history/subjective:   No acute issues overnight. Still on vasopressors; however, slightly decreased norepinephrine requirement over the past 24 hours. Currently on Dobutamine 2.5 and Levo 4.  Objective   Blood pressure (!) 99/43, pulse 76, temperature 97.6 F (36.4 C), temperature source Oral, resp. rate (!) 24, height _0  (1.803 m), weight 63.7 kg, SpO2 93 %. CVP:  [6 mmHg-13 mmHg] 8 mmHg  Vent Mode: CPAP;PSV FiO2 (%):  [40 %] 40 % Set Rate:  [25 bmp] 25 bmp Vt Set:  [560 mL] 560 mL PEEP:  [5 cmH20] 5 cmH20 Pressure Support:  [15 cmH20] 15 cmH20 Plateau Pressure:  [17 cmH20] 17 cmH20   Intake/Output Summary (Last 24 hours) at 06/03/2019 0843 Last data filed at 06/03/2019 0700 Gross per 24 hour  Intake 1683.3 ml  Output 555 ml  Net 1128.3 ml   Filed Weights   06/01/19 0600 06/02/19 0400 06/03/19 0545  Weight: 63 kg 62.3 kg 63.7 kg    Examination:   GEN: Cachectic, debilitated elderly male on mechanical life support HEENT: Tracheostomy tube in place, clean dry and intact; left IJ tunneled cath (placed 5/3) CV: Regular rate rhythm, S1-S2 PULM: Coarse bilateral breath sounds GI: Soft nontender nondistended, +BS EXT: Severe muscle wasting present upper and lower extremities; trace bilateral LE edema around ankles NEURO: Moves all 4 extremities follows commands PSYCH: RASS 0 SKIN: Multiple areas of skin breakdown scabbing  Reviewed labs this am  Resolved Problems   Agitated delirium- improved - seroquel qHS, continue melatonin - continue PRN oxycodone - re-orient, encourage day/night cycles  Assessment &  Plan:   Acute on chronic hypoxemic and hypercarbic respiratory failure after CABG now s/p trach. Multifactorial etiology. Treated Enterobacter pneumonia s/p antibiotics. Persistent diffuse interstitial and airspace process on CXR 5/18 in setting of CHF and severe protein calorie malnutrition manifested by resultant pulmonary edema and pleural effusions. -Will speak with respiratory therapy about pressure support trials today and continue as tolerated. -Possible future trach collar trial as tolerated -Likely need continued ventilator at night -Chest tube management per primary.  Shock- likely multifactorial etiology in setting of acute systolic CHF 2/2 iCM (most recent 5/13 EF 35-40%) s/p CABG/MVR 4/6 and cardiogenic shock. Most likely also has distributive component in setting of protein calorie malnutrition resulting in low oncotic pressure -> significant third spacing.  -Mixed etiology for his shock. -Continue norepinephrine dobutamine to maintain mean arterial pressure greater than 65. Wean norepi as tolerated.  ESRD: oliguric/anuric AKI in setting of multifactorial shock as described above. CRRT started on 4/10 and stopped 4/29 with failed attempt to transition to iHD on 5/1. Now back on CRRT on 5/7 2/2 acute worsening of oxygenation and hypotension. He currently remains anuric. -CVVHD per nephrology -Nephrology okay with 28m pos/hr to help with weaning pressors; however, BP remains low. -Will continue CRRT as long as he has no UOP and requires vasopressors -Anemia most likely multifactorial in setting of CKD. Required 1u pRBC yesterday. Hemoglobin acceptable today. Darbo 1063m started 5/11. Up-titrate as needed.  Constipation/colonic ileus - Received relistor 5/13, already on aggressive bowel regimen, add neostigmine and smog enema -Tube feeds at goal -  Will replete phos today  Dysphagia- -Likely need PEG tube at some point  Hypoglycemia, resolved -Learning tube feeds continue to  follow  Profound muscular deconditioning -PT OT  Goals of care-continue current level of care  Best practice:  Diet: Tube feeds Pain/Anxiety/Delirium protocol (if indicated): see above VAP protocol (if indicated):HOB 30 degrees  DVT prophylaxis: ASA 368m, SCDs GI prophylaxis: PPI Glucose control:SSI Mobility: BR Code Status: Full  Family Communication:per primary team Disposition:ICU pending CRRT and vent liberation    McKensie Wall, MS4  I agree with the documentation from [employer] MS 4.  Pulmonary critical care attending:  This is a 64year old gentleman prolonged hospital stay, day 463  Severe three-vessel disease, severe coronary artery disease status post CABG.  Ultimately required significant amount of support including balloon pump.  Patient had prolonged mechanical ventilator needs.  Difficult to wean and decision was made for tracheostomy tube placement.  Patient's hospital course has been complicated by need for CVVHD.  Prolonged inotropic support as well as prolonged mechanical ventilator support.  Pulmonary has been following for ventilator wean.  BP (!) 99/43   Pulse 68   Temp 97.6 F (36.4 C) (Oral)   Resp 12   Ht _0  (1.803 m) Comment: measured x 3  Wt 63.7 kg   SpO2 97%   BMI 19.59 kg/m   General: Elderly male debilitated frail Heart: Regular rate rhythm S1-S2 Lungs: Bilateral clinically ventilated breaths Abdomen: Soft nontender  Labs: Reviewed  Assessment: Chronic hypoxemic respiratory failure requiring tracheostomy tube and longstanding mechanical ventilation. Cardiogenic shock, inotropic support Renal failure requiring CVVHD.  Plan: Attempt trach collar trials as tolerated. Wean to pressure support as tolerated. Vasopressors to maintain mean arterial pressure greater than 65  This patient is critically ill with multiple organ system failure; which, requires frequent high complexity decision making, assessment, support,  evaluation, and titration of therapies. This was completed through the application of advanced monitoring technologies and extensive interpretation of multiple databases. During this encounter critical care time was devoted to patient care services described in this note for 31 minutes.  BGarner Nash DO [facility] 06/03/2019 4:01 PM

## 2019-06-04 ENCOUNTER — Inpatient Hospital Stay (HOSPITAL_COMMUNITY): Payer: Medicaid Other

## 2019-06-04 LAB — RENAL FUNCTION PANEL
Albumin: 2.2 g/dL — ABNORMAL LOW (ref 3.5–5.0)
Albumin: 2.2 g/dL — ABNORMAL LOW (ref 3.5–5.0)
Anion gap: 7 (ref 5–15)
Anion gap: 6 (ref 5–15)
BUN: 14 mg/dL (ref 8–23)
BUN: 12 mg/dL (ref 8–23)
CO2: 25 mmol/L (ref 22–32)
CO2: 27 mmol/L (ref 22–32)
Calcium: 7.5 mg/dL — ABNORMAL LOW (ref 8.9–10.3)
Calcium: 8 mg/dL — ABNORMAL LOW (ref 8.9–10.3)
Chloride: 103 mmol/L (ref 98–111)
Chloride: 104 mmol/L (ref 98–111)
Creatinine, Ser: 0.93 mg/dL (ref 0.61–1.24)
Creatinine, Ser: 1 mg/dL (ref 0.61–1.24)
GFR calc Af Amer: >60 mL/min (ref >60)
GFR calc Af Amer: >60 mL/min (ref >60)
GFR calc non Af Amer: >60 mL/min (ref >60)
GFR calc non Af Amer: >60 mL/min (ref >60)
Glucose, Bld: 144 mg/dL — ABNORMAL HIGH (ref 70–99)
Glucose, Bld: 109 mg/dL — ABNORMAL HIGH (ref 70–99)
Phosphorus: 2.3 mg/dL — ABNORMAL LOW (ref 2.5–4.6)
Phosphorus: 2.6 mg/dL (ref 2.5–4.6)
Potassium: 4.4 mmol/L (ref 3.5–5.1)
Potassium: 4.6 mmol/L (ref 3.5–5.1)
Sodium: 135 mmol/L (ref 135–145)
Sodium: 137 mmol/L (ref 135–145)

## 2019-06-04 LAB — GLUCOSE, CAPILLARY
Glucose-Capillary: 104 mg/dL — ABNORMAL HIGH (ref 70–99)
Glucose-Capillary: 116 mg/dL — ABNORMAL HIGH (ref 70–99)
Glucose-Capillary: 122 mg/dL — ABNORMAL HIGH (ref 70–99)
Glucose-Capillary: 131 mg/dL — ABNORMAL HIGH (ref 70–99)
Glucose-Capillary: 73 mg/dL (ref 70–99)
Glucose-Capillary: 82 mg/dL (ref 70–99)

## 2019-06-04 LAB — PREPARE RBC (CROSSMATCH)

## 2019-06-04 LAB — CBC
HCT: 25.7 % — ABNORMAL LOW (ref 39.0–52.0)
Hemoglobin: 7.8 g/dL — ABNORMAL LOW (ref 13.0–17.0)
MCH: 29.9 pg (ref 26.0–34.0)
MCHC: 30.4 g/dL (ref 30.0–36.0)
MCV: 98.5 fL (ref 80.0–100.0)
Platelets: 97 10*3/uL — ABNORMAL LOW (ref 150–400)
RBC: 2.61 MIL/uL — ABNORMAL LOW (ref 4.22–5.81)
RDW: 17.1 % — ABNORMAL HIGH (ref 11.5–15.5)
WBC: 4.7 10*3/uL (ref 4.0–10.5)
nRBC: 0 % (ref 0.0–0.2)

## 2019-06-04 LAB — COOXEMETRY PANEL
Carboxyhemoglobin: 1.8 % — ABNORMAL HIGH (ref 0.5–1.5)
Methemoglobin: 1.3 % (ref 0.0–1.5)
O2 Saturation: 60.1 %
Total hemoglobin: 7.7 g/dL — ABNORMAL LOW (ref 12.0–16.0)

## 2019-06-04 LAB — MAGNESIUM: Magnesium: 2.2 mg/dL (ref 1.7–2.4)

## 2019-06-04 MED ORDER — VITAL 1.5 CAL PO LIQD
1000.0000 mL | ORAL | Status: DC
  Administered 2019-06-04 – 2019-06-05 (×2): 1000 mL
  Filled 2019-06-04 (×3): qty 1000

## 2019-06-04 MED ORDER — SORBITOL 70 % SOLN
30.0000 mL | Freq: Every morning | Status: DC
  Administered 2019-06-05 – 2019-06-08 (×4): 30 mL
  Filled 2019-06-04 (×4): qty 30

## 2019-06-04 MED ORDER — NEOSTIGMINE METHYLSULFATE 10 MG/10ML IV SOLN
0.2500 mg | Freq: Two times a day (BID) | INTRAVENOUS | Status: DC
  Administered 2019-06-04 – 2019-06-05 (×2): 0.25 mg via SUBCUTANEOUS
  Filled 2019-06-04 (×3): qty 0.25

## 2019-06-04 MED ORDER — SODIUM CHLORIDE 0.9 % IV SOLN: INTRAVENOUS | Status: DC | PRN

## 2019-06-04 MED ORDER — METOCLOPRAMIDE HCL 5 MG/ML IJ SOLN
5.0000 mg | Freq: Four times a day (QID) | INTRAMUSCULAR | Status: DC
  Administered 2019-06-04 – 2019-06-05 (×3): 5 mg via INTRAVENOUS
  Filled 2019-06-04 (×3): qty 2

## 2019-06-04 NOTE — Progress Notes (Signed)
Patient's daughter Lawerance Bach called for update. All questions answered.

## 2019-06-04 NOTE — Progress Notes (Signed)
NAME:  Brendan [name], MRN:  [id number], DOB:  30-Nov-1955, LOS: 78 ADMISSION DATE:  03/17/2019, CONSULTATION DATE:  04/17/19 REFERRING MD:  Dr. Haroldine Laws, CHIEF COMPLAINT:  Respiratory distress   Brief History   72 yoM originally presented with SOB and fatigue at [facility] found to have new HFrEF, NSTEMI, and bilateral pleural effusions transferred to [facility] on 3/29 for further cardiac evaluation. 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 Started on lasix and milrinone gtts however developed NSVT. Was taken 4/1 for placement of IABP and swan for optimization prior to CABG. Was on precedex for agitation/ confusion, some concern for DTs. On the evening of 4/1, patient developed worsening respiratory distress and hypoxia, PCCM consulted for intubation and vent management.  Patient was extubated on 4/14 and developed worsening hypoxia on BiPAP. Overnight he developed pulseless VT requiring 1 minute of CPR and epinephrine to achieve ROSC. Neurovascularly intact afterward. Subsequently had increased work of breathing and worsening respiratory distress requiring intubation. Patient had tracheostomy performed on 05/06/2019. Trying to wean off ventilator.   Past Medical History  Tobacco abuse, HTN, poorly controlled diabetes, diabetic neuropathy  Significant Hospital Events   3/30 Lt heart cath/TEE performed 4/1 Intubated, RHC/IABP/PA cath 4/2 Impella placement 4/3 febrile over evening and night. Blood cultures obtained. Vanc/cefepime started. Hematuria--heparin held. 4/4 Extubated,abx transitioned to rocephin. Heparin restarted 4/6 Re-intubated for CABG/MVR 4/7 Extubated from CABG to BiPAP 4/9 Impella removed  4/9 TEE 4/10 CVC inserted &CRRT initiated  4/12 PCCMre-consulted re-intubation 4/14 Extubated to BiPAP 4/15 Re-intubated due to respiratory distress 4/16 Tracheostomy 4/27 bronch with BAL 5/7 called back for hypercarbic failure. #6 cuffed placed. Right chest tube placed for enlarging  effusion. 5/15-18 remains on vent, CVVHD  Consults:  TCTS PCCM HF Nephrology  Procedures:  R cephalic double lumen PICC LIJ tunneled HD catheter L brachial arterial line 6-0 cuffed shiley  Significant Diagnostic Tests:  3/30 R/ LHC >>  Ost LM to Mid LM lesion is 35% stenosed.  Prox LAD lesion is 90% stenosed.  Prox LAD to Mid LAD lesion is 50% stenosed.  Mid Cx lesion is 100% stenosed.  Prox RCA to Mid RCA lesion is 90% stenosed.  RPDA lesion is 95% stenosed.  LV end diastolic pressure is severely elevated.  Hemodynamic findings consistent with moderate pulmonary hypertension. 1. Severe 3 vessel obstructive CAD 2. High LV filling pressures 3. Reduced cardiac output with index 2.3 4. Moderate pulmonary HTN.  3/30TTE >> 1. Left ventricular ejection fraction, by estimation, is 30 to 35%. The left ventricle has moderately decreased function. The left ventricle demonstrates regional wall motion abnormalities.There is mild left ventricular hypertrophy. Left ventricular diastolic function could not be evaluated. There is severe hypokinesis of the left ventricular, entire inferior wall, inferoseptal wall, apical segment and lateral wall.  2. Right ventricular systolic function is hyperdynamic. The right ventricular size is normal. There is moderately elevated pulmonary artery systolic pressure.  3. Decreased posterior leaflet motion due to ischemic tethering of the mitral valve leaflets.  4. The mitral valve is abnormal. Moderate to severe mitral valve regurgitation.  5. The aortic valve is tricuspid. Aortic valve regurgitation is not visualized. Mild aortic valve sclerosis is present, with no evidence of aortic valve stenosis.  6. The inferior vena cava is normal in size with greater than 50% respiratory variability, suggesting right atrial pressure of 3 mmHg.   4/1 CT chest w/o >>Large bilateral pleural effusions with associated atelectasis.Moderate to severe  bilateral ground-glass opacities, likely reflecting pneumonia and/or edema.  4/1 RHC >> RA = 18 RV = 60/20  PA = 63/29 (42) PCW = 33 Fick cardiac output/index = 3.7/1.9 PVR = 2.5 WU FA sat = 98% PA sat = 47%, 49% PaPi = 2.2   Micro Data:  5/7 R pleural fluid neg 5/7 blood neg to date 5/6 tracheal aspirate enterobacter  Antimicrobials:    Interim history/subjective:   No issues overnight.  Remains on dobutamine and Levophed.  Still on CVVHD  Objective   Blood pressure (!) 92/59, pulse 76, temperature (!) 97.5 F (36.4 C), temperature source Oral, resp. rate 17, height _0  (1.803 m), weight 64.8 kg, SpO2 96 %. CVP:  [6 mmHg-23 mmHg] 23 mmHg  Vent Mode: CPAP;PSV FiO2 (%):  [40 %] 40 % Set Rate:  [25 bmp] 25 bmp Vt Set:  [560 mL] 560 mL PEEP:  [5 cmH20] 5 cmH20 Pressure Support:  [12 cmH20-15 cmH20] 12 cmH20 Plateau Pressure:  [14 cmH20-19 cmH20] 14 cmH20   Intake/Output Summary (Last 24 hours) at 06/04/2019 1202 Last data filed at 06/04/2019 0800 Gross per 24 hour  Intake 1159.38 ml  Output 16 ml  Net 1143.38 ml   Filed Weights   06/02/19 0400 06/03/19 0545 06/04/19 0500  Weight: 62.3 kg 63.7 kg 64.8 kg    Examination:   GEN: Cachectic elderly debilitated gentleman, on mechanical ventilator, CVVHD HEENT: Tracheostomy tube in place, clean dry and intact CV: Regular rate rhythm, S1-S2 PULM: Coarse bilateral ventilated breath sounds GI: Soft nontender nondistended EXT: No significant edema NEURO: Alert oriented following commands PSYCH: RASS 0 SKIN: Multiple areas skin breakdown scabbing  Labs reviewed  Resolved Problems   Agitated delirium- improved - seroquel qHS, continue melatonin - continue PRN oxycodone - re-orient, encourage day/night cycles  Assessment & Plan:   Acute on chronic hypoxemic and hypercarbic respiratory failure after CABG now s/p trach. Multifactorial etiology. Treated Enterobacter pneumonia s/p antibiotics. Persistent  diffuse interstitial and airspace process on CXR 5/18 in setting of CHF and severe protein calorie malnutrition manifested by resultant pulmonary edema and pleural effusions. Plan: -Pressure support trials today much as tolerated -Possible trach collar trial today. -Chest tube management per primary  Shock- likely multifactorial etiology in setting of acute systolic CHF 2/2 iCM (most recent 5/13 EF 35-40%) s/p CABG/MVR 4/6 and cardiogenic shock. Most likely also has distributive component in setting of protein calorie malnutrition resulting in low oncotic pressure -> significant third spacing.  -Mixed etiology of shock -Continue norepinephrine, dobutamine Wean per protocol -Pressor weaning per primary team.  ESRD: oliguric/anuric AKI in setting of multifactorial shock as described above. CRRT started on 4/10 and stopped 4/29 with failed attempt to transition to iHD on 5/1. Now back on CRRT on 5/7 2/2 acute worsening of oxygenation and hypotension. He currently remains anuric. -CVVHD per nephrology  Constipation/colonic ileus -Tube feeds at goal -Continue to monitor  Dysphagia- -Likely need PEG tube at some point  Hypoglycemia, resolved -Continue tube feeds, continue to monitor  Profound muscular deconditioning -PT OT  Goals of care-continue current level of care  Best practice:  Diet: Tube feeds Pain/Anxiety/Delirium protocol (if indicated): see above VAP protocol (if indicated):HOB 30 degrees  DVT prophylaxis: ASA 352m, SCDs GI prophylaxis: PPI Glucose control:SSI Mobility: BR Code Status: Full  Family Communication:per primary team Disposition:ICU pending CRRT and vent liberation   This patient is critically ill with multiple organ system failure; which, requires frequent high complexity decision making, assessment, support, evaluation, and titration of therapies. This was completed through the application  of advanced monitoring technologies and extensive  interpretation of multiple databases. During this encounter critical care time was devoted to patient care services described in this note for 31 minutes.  Garner Nash, DO [facility] 06/04/2019 12:13 PM

## 2019-06-04 NOTE — Progress Notes (Signed)
[facility] Progress Note   [facility] with [facility], new low EF and NSTEMI txd to [facility] 3/29: 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 Admission Scr was 0.99.  Assessment/ Plan:   1. Acute hypoxic respiratory failure on 05/23/19- on vent via trach. S/p right chest tube. Ventilation status unchanged. 2. Oliguric/anuricAKI- CRRT started on 4/10 and stopped 4/29 w/ attempted transition to IHD on 05/17/19 but failed and now back on CRRT 1. Remains anuric. 2. Resumed CRRT on 05/23/19 following acute worsening of his oxygenation and hypotension.  3. Dialysate 4K/2.5 Ca for replacement fluids and dialysate. no heparin.500/300/1000 pre/post/dialysate.  4. Keeping even for now. 5. Plan on stopping  CRRT Fri evening and then will reassess over weekend.  3. Vascular access- LIJ tunneled  catheter placed by IR on 05/19/19 (actually already tunneled)  4. Anemia of CKD, multifactorial-transfusions per primary team. Hemoglobin acceptable today. Darbo 146mg started 5/11. CTM and consider uptitrating as needed 5. CAD- 3 vessel with EF 25% s/p CABG and MVR 42/8/41complicated by cardiogenic shock as below 6. Acute systolic CHF and cardiogenic shock- still on pressors. Volume improved with CRRT/IHD. On pretty max midodrine as well, unlike to benefit from higher dose. 7. Elytes- Monitor phos daily. K acceptable today.  8. MR s/p MVR 9. Severe debility- will likely need LTAC if survives. [facility] possibly in the works  Subjective:   Overall unchanged and no acute issues overnight.Dobutamine 2.5 and Levo 5. TF off   Objective:   BP (!) 87/44   Pulse 76   Temp (!) 97.5 F (36.4 C) (Oral)   Resp 19   Ht 5' 11"  (1.803 m) Comment: measured x 3  Wt 64.8 kg   SpO2 92%   BMI 19.92 kg/m   Intake/Output Summary (Last 24 hours) at 06/04/2019 1055 Last data filed at  06/04/2019 0800 Gross per 24 hour  Intake 1294.68 ml  Output 37 ml  Net 1257.68 ml   Weight change: 1.1 kg  Physical Exam: Gen: frail, thin, cachectic, alert Neck: lef IJ  Tunneled cath (placed 5/3) CVS: normal rate Resp:Coarse bilateral breath sounds, ventilated/trached Abd: +BS, soft, NT/ND Ext: Trace bilateral lower extremity edema at the ankle- atrophied legs  Imaging: DG Chest Port 1 View  Result Date: 06/04/2019 CLINICAL DATA:  CABG/tracheostomy. EXAM: PORTABLE CHEST 1 VIEW COMPARISON:  06/03/2019 FINDINGS: Tracheostomy tube unchanged. Enteric tube courses into the stomach and off the film as tip is not visualized. Right-sided PICC line has been pulled back slightly and has tip at the cavoatrial junction. Left IJ dialysis catheter with tip just below the cavoatrial junction. Right-sided chest tube unchanged. Lungs are adequately inflated demonstrate persistent hazy bilateral airspace process with perihilar predominance suggesting interstitial edema although multifocal infection is possible. Possible small amount of right pleural fluid. Cardiomediastinal silhouette and remainder of the exam is unchanged. IMPRESSION: 1. Persistent bilateral hazy airspace process with perihilar predominance suggesting interstitial edema although multifocal infection is possible. Possible small amount right pleural fluid. 2.  Tubes and lines as described. Electronically Signed   By: DMarin OlpM.D.   On: 06/04/2019 07:32   DG Chest Port 1 View  Result Date: 06/03/2019 CLINICAL DATA:  History of recent open heart surgery with persistent shortness of breath. EXAM: PORTABLE CHEST 1 VIEW COMPARISON:  06/01/2019 FINDINGS: The tracheostomy tube is stable. The feeding tube is stable. The right PICC line  and left IJ central venous catheters are stable. Stable right-sided chest tube without definite right-sided pneumothorax. Persistent diffuse interstitial and airspace process in the lungs. No large pleural  effusions. IMPRESSION: 1. Stable support apparatus. 2. Persistent diffuse interstitial and airspace process. Electronically Signed   By: Marijo Sanes M.D.   On: 06/03/2019 07:58    Labs: BMET Recent Labs  Lab 06/01/19 0417 06/01/19 0419 06/01/19 1456 06/02/19 0443 06/02/19 0445 06/02/19 1511 06/03/19 0436 06/03/19 1518 06/04/19 0720  NA 136   < > 136 140 136 137 137 136 135  K 4.4   < > 4.4 4.3 4.6 5.0 4.4 4.8 4.4  CL 105  --  104  --  106 104 107 105 103  CO2 25  --  26  --  27 27 29 26 25   GLUCOSE 119*  --  123*  --  172* 91 113* 121* 144*  BUN 12  --  10  --  12 11 10 13 14   CREATININE 1.14  --  1.13  --  1.14 1.11 1.02 1.13 0.93  CALCIUM 8.0*  --  8.3*  --  7.9* 8.2* 7.5* 8.0* 7.5*  PHOS 1.9*  --  3.8  --  3.1 3.1 2.2* 2.7 2.3*   < > = values in this interval not displayed.   CBC Recent Labs  Lab 06/01/19 0417 06/01/19 0419 06/02/19 0443 06/02/19 0445 06/03/19 0437 06/04/19 0442  WBC 6.2  --   --  4.4 6.6 4.7  HGB 8.1*   < > 7.5* 7.9* 8.2* 7.8*  HCT 25.3*   < > 22.0* 25.5* 26.4* 25.7*  MCV 95.1  --   --  97.3 98.1 98.5  PLT 104*  --   --  114* 99* 97*   < > = values in this interval not displayed.    Medications:    . aspirin EC  325 mg Oral Daily   Or  . aspirin  324 mg Per Tube Daily  . B-complex with vitamin C  1 tablet Per Tube Daily  . chlorhexidine gluconate (MEDLINE KIT)  15 mL Mouth Rinse BID  . Chlorhexidine Gluconate Cloth  6 each Topical Q0600  . citalopram  10 mg Per Tube Daily  . collagenase   Topical Daily  . darbepoetin (ARANESP) injection - NON-DIALYSIS  100 mcg Subcutaneous Q Tue-1800  . docusate  200 mg Per Tube Daily  . feeding supplement (PRO-STAT SUGAR FREE 64)  30 mL Per Tube BID  . gabapentin  100 mg Oral Q8H  . insulin aspart  0-6 Units Subcutaneous Q4H  . mouth rinse  15 mL Mouth Rinse 10 times per day  . melatonin  3 mg Per Tube QHS  . metoCLOPramide (REGLAN) injection  10 mg Intravenous Q6H  . midodrine  20 mg Per Tube Q8H   . neostigmine  0.25 mg Subcutaneous Q6H  . pantoprazole sodium  40 mg Per Tube BID  . polyethylene glycol  17 g Oral Daily  . QUEtiapine  25 mg Oral QHS  . rOPINIRole  0.5 mg Oral QHS  . sennosides  5 mL Per Tube BID  . sodium chloride flush  10-40 mL Intracatheter Q12H      Otelia Santee, MD 06/04/2019, 10:55 AM

## 2019-06-04 NOTE — Progress Notes (Signed)
33 Days Post-Op Procedure(s) (LRB): VIDEO BRONCHOSCOPY USING DISPOSABLE ANESTHESIA SCOPE (N/A) TRACHEOSTOMY (N/A) Subjective: Awake on CPAP, responsive breathing comfortably Hemoglobin back down to 7.8, remains on norepinephrine so we'll transfuse 1 unit of packed cells Maintaining sinus rhythm, mixed venous saturation 60%  Objective: Vital signs in last 24 hours: Temp:  [96.3 F (35.7 C)-97.5 F (36.4 C)] 97.5 F (36.4 C) (05/19 0732) Pulse Rate:  [63-84] 76 (05/19 0845) Cardiac Rhythm: Normal sinus rhythm (05/19 0000) Resp:  [5-27] 19 (05/19 0845) BP: (38-107)/(18-61) 87/44 (05/19 0800) SpO2:  [70 %-100 %] 92 % (05/19 0845) Arterial Line BP: (71-124)/(35-63) 87/40 (05/19 0845) FiO2 (%):  [40 %] 40 % (05/19 0731) Weight:  [64.8 kg] 64.8 kg (05/19 0500)  Hemodynamic parameters for last 24 hours: CVP:  [6 mmHg-16 mmHg] 13 mmHg  Intake/Output from previous day: 05/18 0701 - 05/19 0700 In: 1395.4 [I.V.:625.4; NG/GT:770] Out: 108 [Chest Tube:80] Intake/Output this shift: Total I/O In: 67.7 [I.V.:7.7; NG/GT:60] Out: -42        Exam    General- alert and comfortable    Neck- no JVD, no cervical adenopathy palpable, no carotid bruit   Lungs- clear without rales, wheezes sternal incision well-healed   Cor- regular rate and rhythm, no murmur , gallop   Abdomen- soft, non-tender   Extremities - warm, non-tender, minimal edema   Neuro- oriented, appropriate, no focal weakness   Lab Results: Recent Labs    06/03/19 0437 06/04/19 0442  WBC 6.6 4.7  HGB 8.2* 7.8*  HCT 26.4* 25.7*  PLT 99* 97*   BMET:  Recent Labs    06/03/19 1518 06/04/19 0720  NA 136 135  K 4.8 4.4  CL 105 103  CO2 26 25  GLUCOSE 121* 144*  BUN 13 14  CREATININE 1.13 0.93  CALCIUM 8.0* 7.5*    PT/INR: No results for input(s): LABPROT, INR in the last 72 hours. ABG    Component Value Date/Time   PHART 7.426 06/02/2019 0443   HCO3 26.3 06/02/2019 0443   TCO2 27 06/02/2019 0443    ACIDBASEDEF 3.0 (H) 04/28/2019 0735   O2SAT 60.1 06/04/2019 0425   CBG (last 3)  Recent Labs    06/03/19 2343 06/04/19 0420 06/04/19 0729  GLUCAP 110* 116* 131*    Assessment/Plan: S/P Procedure(s) (LRB): VIDEO BRONCHOSCOPY USING DISPOSABLE ANESTHESIA SCOPE (N/A) TRACHEOSTOMY (N/A) DC A-line-this represents risk for infection 1 unit of packed cells Transition to trach collar as tolerated, rest on ventilator tonight Slow wean of norepinephrine after transfusion is planned   LOS: 51 days    [name] 06/04/2019

## 2019-06-04 NOTE — Progress Notes (Signed)
SLP Cancellation Note  Patient Details Name: YAKIR WENKE MRN: [id number] DOB: [birth year]   Cancelled treatment:       Reason Eval/Treat Not Completed: Patient not medically ready(Pt on vent at this time. SLP will follow up. )  Nealy Karapetian I. Hardin Negus, [place], [place] Office number [phone] Pager [place] 06/04/2019, 12:42 PM

## 2019-06-04 NOTE — Progress Notes (Signed)
RT was unable to obtain an A-line x 2 attempts. RN is aware.

## 2019-06-04 NOTE — Procedures (Signed)
Cortrak  Person Inserting Tube:  Jacklynn Barnacle E, RD Tube Type:  Cortrak - 43 inches Tube Location:  Right nare Initial Placement:  Postpyloric Secured by: Bridle Technique Used to Measure Tube Placement:  Documented cm marking at nare/ corner of mouth Cortrak Secured At:  100 cm    Cortrak Tube Team Note:  Consult received to advance Cortrak Feeding tube post-pyloric. Unable to use existing tube so replaced with new Cortrak tube.   X-ray is required, abdominal x-ray has been ordered by the Cortrak team. Please confirm tube placement before using the Cortrak tube.   If the tube becomes dislodged please keep the tube and contact the Cortrak team at [email or web address] ([email or web address]) for replacement.  If after hours and replacement cannot be delayed, place a NG tube and confirm placement with an abdominal x-ray.   Jacklynn Barnacle, MS, RD, LDN Pager number available on Amion

## 2019-06-04 NOTE — Progress Notes (Signed)
Patient's wife Brendan [name] called for update. All questions answered.

## 2019-06-04 NOTE — Progress Notes (Signed)
Patient ID: JERIAN MORAIS, male   DOB: [birth year], 64 y.o.   MRN: [id number]     Advanced Heart Failure Rounding Note  PCP-Cardiologist: No primary care provider on file.   Subjective:    Remains on vent through trach. On CVVHD keeping even but weight up 20 pounds.   Remains on dobutamine 2.5. NE up to 5. Getting 1u RBCs to help wean.   CVP 5-6. Co-ox 60%   Objective:   Weight Range: 64.8 kg Body mass index is 19.92 kg/m.   Vital Signs:   Temp:  [96.3 F (35.7 C)-97.7 F (36.5 C)] 97.7 F (36.5 C) (05/19 1247) Pulse Rate:  [63-84] 76 (05/19 1300) Resp:  [5-27] 17 (05/19 1300) BP: (38-107)/(18-64) 84/57 (05/19 1300) SpO2:  [70 %-100 %] 94 % (05/19 1300) Arterial Line BP: (71-124)/(35-63) 87/40 (05/19 0845) FiO2 (%):  [40 %] 40 % (05/19 1110) Weight:  [64.8 kg] 64.8 kg (05/19 0500) Last BM Date: 06/02/19  Weight change: Filed Weights   06/02/19 0400 06/03/19 0545 06/04/19 0500  Weight: 62.3 kg 63.7 kg 64.8 kg    Intake/Output:   Intake/Output Summary (Last 24 hours) at 06/04/2019 1336 Last data filed at 06/04/2019 1300 Gross per 24 hour  Intake 1119.23 ml  Output 64 ml  Net 1055.23 ml      Physical Exam   CVP 6 General:  Weak appearing. Cachetic HEENT: normal + cortrak Neck: supple. +trach  Carotids 2+ bilat; no bruits. No lymphadenopathy or thryomegaly appreciated. Cor: PMI nondisplaced. Regular rate & rhythm. No rubs, gallops or murmurs. + tunneled cath Lungs: coarse Abdomen: soft, nontender, nondistended. No hepatosplenomegaly. No bruits or masses. Good bowel sounds. Extremities: no cyanosis, clubbing, rash, edema Neuro: alert & orientedx3, cranial nerves grossly intact. moves all 4 extremities w/o difficulty. Affect pleasant   Telemetry   NSR 70-80s Personally reviewed   Labs    CBC Recent Labs    06/03/19 0437 06/04/19 0442  WBC 6.6 4.7  HGB 8.2* 7.8*  HCT 26.4* 25.7*  MCV 98.1 98.5  PLT 99* 97*   Basic Metabolic Panel Recent Labs     06/03/19 0436 06/03/19 0437 06/03/19 1518 06/04/19 0720  NA   < >  --  136 135  K   < >  --  4.8 4.4  CL   < >  --  105 103  CO2   < >  --  26 25  GLUCOSE   < >  --  121* 144*  BUN   < >  --  13 14  CREATININE   < >  --  1.13 0.93  CALCIUM   < >  --  8.0* 7.5*  MG  --  2.3  --  2.2  PHOS   < >  --  2.7 2.3*   < > = values in this interval not displayed.   Liver Function Tests Recent Labs    06/03/19 1518 06/04/19 0720  ALBUMIN 2.4* 2.2*   No results for input(s): LIPASE, AMYLASE in the last 72 hours. Cardiac Enzymes No results for input(s): CKTOTAL, CKMB, CKMBINDEX, TROPONINI in the last 72 hours.  BNP: BNP (last 3 results) Recent Labs    03/27/2019 0113  BNP 655.7*    ProBNP (last 3 results) No results for input(s): PROBNP in the last 8760 hours.   D-Dimer No results for input(s): DDIMER in the last 72 hours. Hemoglobin A1C No results for input(s): HGBA1C in the last 72 hours. Fasting Lipid Panel  No results for input(s): CHOL, HDL, LDLCALC, TRIG, CHOLHDL, LDLDIRECT in the last 72 hours. Thyroid Function Tests No results for input(s): TSH, T4TOTAL, T3FREE, THYROIDAB in the last 72 hours.  Invalid input(s): FREET3  Other results:   Imaging    DG Abd 1 View  Result Date: 06/04/2019 CLINICAL DATA:  Feeding tube placement EXAM: ABDOMEN - 1 VIEW COMPARISON:  05/30/2019 FINDINGS: Soft feeding tube enters the stomach and passes into the duodenum with its tip at the junction of the third and fourth portions. IMPRESSION: Feeding tube tip in the duodenum at the junction of the third and fourth portions. Electronically Signed   By: Nelson Chimes M.D.   On: 06/04/2019 12:51   DG Chest Port 1 View  Result Date: 06/04/2019 CLINICAL DATA:  CABG/tracheostomy. EXAM: PORTABLE CHEST 1 VIEW COMPARISON:  06/03/2019 FINDINGS: Tracheostomy tube unchanged. Enteric tube courses into the stomach and off the film as tip is not visualized. Right-sided PICC line has been pulled  back slightly and has tip at the cavoatrial junction. Left IJ dialysis catheter with tip just below the cavoatrial junction. Right-sided chest tube unchanged. Lungs are adequately inflated demonstrate persistent hazy bilateral airspace process with perihilar predominance suggesting interstitial edema although multifocal infection is possible. Possible small amount of right pleural fluid. Cardiomediastinal silhouette and remainder of the exam is unchanged. IMPRESSION: 1. Persistent bilateral hazy airspace process with perihilar predominance suggesting interstitial edema although multifocal infection is possible. Possible small amount right pleural fluid. 2.  Tubes and lines as described. Electronically Signed   By: Marin Olp M.D.   On: 06/04/2019 07:32     Medications:     Scheduled Medications: . aspirin EC  325 mg Oral Daily   Or  . aspirin  324 mg Per Tube Daily  . B-complex with vitamin C  1 tablet Per Tube Daily  . chlorhexidine gluconate (MEDLINE KIT)  15 mL Mouth Rinse BID  . Chlorhexidine Gluconate Cloth  6 each Topical Q0600  . citalopram  10 mg Per Tube Daily  . collagenase   Topical Daily  . darbepoetin (ARANESP) injection - NON-DIALYSIS  100 mcg Subcutaneous Q Tue-1800  . docusate  200 mg Per Tube Daily  . feeding supplement (PRO-STAT SUGAR FREE 64)  30 mL Per Tube BID  . gabapentin  100 mg Oral Q8H  . insulin aspart  0-6 Units Subcutaneous Q4H  . mouth rinse  15 mL Mouth Rinse 10 times per day  . melatonin  3 mg Per Tube QHS  . metoCLOPramide (REGLAN) injection  10 mg Intravenous Q6H  . midodrine  20 mg Per Tube Q8H  . neostigmine  0.25 mg Subcutaneous Q6H  . pantoprazole sodium  40 mg Per Tube BID  . polyethylene glycol  17 g Oral Daily  . QUEtiapine  25 mg Oral QHS  . rOPINIRole  0.5 mg Oral QHS  . sennosides  5 mL Per Tube BID  . sodium chloride flush  10-40 mL Intracatheter Q12H    Infusions: .  prismasol BGK 4/2.5 1 each (06/04/19 0934)  .  prismasol BGK 4/2.5  1 each (06/04/19 1231)  . sodium chloride Stopped (05/29/19 1600)  . sodium chloride 10 mL/hr at 06/02/19 1500  . sodium chloride Stopped (05/31/19 0958)  . sodium chloride    . dextrose 5 % and 0.9% NaCl Stopped (06/02/19 1047)  . DOBUTamine 2.5 mcg/kg/min (06/04/19 1300)  . feeding supplement (VITAL 1.5 CAL) 1,000 mL (06/03/19 1100)  . heparin    .  norepinephrine (LEVOPHED) Adult infusion 5 mcg/min (06/04/19 1300)  . prismasol BGK 4/2.5 1 mL (06/04/19 1232)    PRN Medications: Place/Maintain arterial line **AND** sodium chloride, sodium chloride, Place/Maintain arterial line **AND** sodium chloride, alum & mag hydroxide-simeth, dextrose, diphenhydrAMINE, fentaNYL (SUBLIMAZE) injection, Gerhardt's butt cream, heparin, heparin, levalbuterol, ondansetron (ZOFRAN) IV, oxyCODONE, pneumococcal 23 valent vaccine, sodium chloride flush, sorbitol     Assessment/Plan   1. Acute systolic HF ->  Cardiogenic Shock  - Due to iCM. EF 20-25% - Impella 5.5 placed on 4/2.   - s/p CABG/MVRepair on 4/6  - Impella out 4/9.  - Echo 4/10 with EF 25-30%, normal RV, no MR.  - Echo 4/22 EF 30-35% mildly reduced RV MVR ok   - Dobutamine added 5/14 for low co-ox 39% and inability to wean NE.  - Has responded well to dobutamine 2.5. Remains on NE 5. Co-ox remains 60% - Repeat echo 05/29/19. EF 35-40% RV ok.  - Degree of cardiogenic shock seems out of proportion to LV/RV function. Unfortunately every time we try to wean NE his co-ox drops markedly and he has clearly become inotrope dependent. Now seems to be responding to dobutamine. However still unable to wean NE to off. Continue midodrine 20 mg tid  - Getting 1u RBC to help with NE wean   2. 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 Echo stable - No s/s ischemia - On ASA/Crestor  3. Acute hypoxic respiratory failure - Due to pulmonary edema and PNA - Extubated 4/9 re-intubated on 4/12. Failed re-extubation on 4/14. Now s/p  tracheostomy - S/P Bronch 4/27 - 60K yeast. -> ? Colonization.  - CT scan 5/7 with diffuse bilateral infiltrates c/w ARDS and large bilateral effusions R>L.  - s/p R chest tube - Remains on vent FiO2 40% - CCM following.Planning PS today and possible TC trials   4. Mitral Regurgitation - Mod-severe on ECHO - s/p MV repair, TEE 4/9 with minimal MR s/p repair.   - Echo 4/10 with no significant MR.  - echo 4/22 MVR stable - Repeat echo 05/29/19. EF 35-40% RV ok.  MVR ok  5. Uncontrolled DM - Hgb A1C 9.  - On insulin and sliding scale.  - No change  6. AKI - due to shock - Remains anuric post-op - Continue CVVHD per Nephrology. Plan is to keep slightly net + to help w/ BP to allow NE wean.  However he is now up 20 pounds. Likely needs to be a bit negative. Will d/w Nephrology  7. Acute blood loss Anemia  - Transfused 1UPRBCs 5/9/21and 5/14 and 5/17 - hgb 7.8 today.  - No obvious site of bleeding.  - Getting 1u RBCs   8. Polymorphic VT - Recurrent VT during respiratory distress 4/14, - Off amio  - No further VT.  - Keep K> 4.0 Mg > 2.0  - Rhythm stable   9. Severe Malnutrition - Prealbumin improved, up from 8.9>>18.7 - Nutrition on board - Continue w/ TFs - Likely needs PEG  10. Atrial fibrillation. paroxysmal - remains in NSR - Amiodarone was stopped.   73. Debility, severe - as above will likely need LTAC on d/c - Continue PT/OT  12. Chronic pain issues - appears comfortable - having severe constipation. CCM managing  - On Oxy q4 PRN. Titrate down slowly. Can we get to methadone?   Continues to be pressor, vend and CVVHD dependent. Continue dobutamine. Continue attempts to  wean NE and vent.   Family still wanting aggressive care but if cannt wean pressors by end of the weak will need to revisit.   CRITICAL CARE Performed by: Glori Bickers  Total critical care time: 35 minutes  Critical care time was exclusive of separately billable procedures and  treating other patients.  Critical care was necessary to treat or prevent imminent or life-threatening deterioration.  Critical care was time spent personally by me (independent of midlevel providers or residents) on the following activities: development of treatment plan with patient and/or surrogate as well as nursing, discussions with consultants, evaluation of patient's response to treatment, examination of patient, obtaining history from patient or surrogate, ordering and performing treatments and interventions, ordering and review of laboratory studies, ordering and review of radiographic studies, pulse oximetry and re-evaluation of patient's condition.    Glori Bickers, MD  1:36 PM

## 2019-06-04 NOTE — Progress Notes (Signed)
Notified Dr. Prescott Gum re: continued n/v.  Will restart tube feeds at 10-20 ml/hr.  Made aware of cortrak changes and KUB.  Let him know RT was unable to get a new aline however, cuff pressures have been adequate and levophed gtt has been weaned off per orders.

## 2019-06-05 ENCOUNTER — Encounter (HOSPITAL_COMMUNITY): Payer: Self-pay | Admitting: Cardiothoracic Surgery

## 2019-06-05 DIAGNOSIS — N179 Acute kidney failure, unspecified: Secondary | ICD-10-CM

## 2019-06-05 DIAGNOSIS — Z9911 Dependence on respirator [ventilator] status: Secondary | ICD-10-CM

## 2019-06-05 LAB — TYPE AND SCREEN
ABO/RH(D): O NEG
Antibody Screen: NEGATIVE
Unit division: 0

## 2019-06-05 LAB — RENAL FUNCTION PANEL
Albumin: 1.9 g/dL — ABNORMAL LOW (ref 3.5–5.0)
Albumin: 2.1 g/dL — ABNORMAL LOW (ref 3.5–5.0)
Anion gap: 4 — ABNORMAL LOW (ref 5–15)
Anion gap: 6 (ref 5–15)
BUN: 13 mg/dL (ref 8–23)
BUN: 11 mg/dL (ref 8–23)
CO2: 28 mmol/L (ref 22–32)
CO2: 26 mmol/L (ref 22–32)
Calcium: 7.8 mg/dL — ABNORMAL LOW (ref 8.9–10.3)
Calcium: 8 mg/dL — ABNORMAL LOW (ref 8.9–10.3)
Chloride: 104 mmol/L (ref 98–111)
Chloride: 106 mmol/L (ref 98–111)
Creatinine, Ser: 1 mg/dL (ref 0.61–1.24)
Creatinine, Ser: 0.98 mg/dL (ref 0.61–1.24)
GFR calc Af Amer: >60 mL/min (ref >60)
GFR calc Af Amer: >60 mL/min (ref >60)
GFR calc non Af Amer: >60 mL/min (ref >60)
GFR calc non Af Amer: >60 mL/min (ref >60)
Glucose, Bld: 140 mg/dL — ABNORMAL HIGH (ref 70–99)
Glucose, Bld: 139 mg/dL — ABNORMAL HIGH (ref 70–99)
Phosphorus: 1.7 mg/dL — ABNORMAL LOW (ref 2.5–4.6)
Phosphorus: 1.7 mg/dL — ABNORMAL LOW (ref 2.5–4.6)
Potassium: 4.3 mmol/L (ref 3.5–5.1)
Potassium: 4.4 mmol/L (ref 3.5–5.1)
Sodium: 136 mmol/L (ref 135–145)
Sodium: 138 mmol/L (ref 135–145)

## 2019-06-05 LAB — GLUCOSE, CAPILLARY
Glucose-Capillary: 121 mg/dL — ABNORMAL HIGH (ref 70–99)
Glucose-Capillary: 126 mg/dL — ABNORMAL HIGH (ref 70–99)
Glucose-Capillary: 136 mg/dL — ABNORMAL HIGH (ref 70–99)
Glucose-Capillary: 136 mg/dL — ABNORMAL HIGH (ref 70–99)
Glucose-Capillary: 66 mg/dL — ABNORMAL LOW (ref 70–99)
Glucose-Capillary: 67 mg/dL — ABNORMAL LOW (ref 70–99)
Glucose-Capillary: 69 mg/dL — ABNORMAL LOW (ref 70–99)
Glucose-Capillary: 88 mg/dL (ref 70–99)
Glucose-Capillary: 91 mg/dL (ref 70–99)

## 2019-06-05 LAB — COOXEMETRY PANEL
Carboxyhemoglobin: 1.6 % — ABNORMAL HIGH (ref 0.5–1.5)
Methemoglobin: 0.6 % (ref 0.0–1.5)
O2 Saturation: 65.8 %
Total hemoglobin: 9 g/dL — ABNORMAL LOW (ref 12.0–16.0)

## 2019-06-05 LAB — MRSA PCR SCREENING: MRSA by PCR: NEGATIVE

## 2019-06-05 LAB — BPAM RBC
Blood Product Expiration Date: 202105232359
ISSUE DATE / TIME: 202105191229
Unit Type and Rh: 9500

## 2019-06-05 LAB — CBC
HCT: 26.7 % — ABNORMAL LOW (ref 39.0–52.0)
Hemoglobin: 8.3 g/dL — ABNORMAL LOW (ref 13.0–17.0)
MCH: 30.1 pg (ref 26.0–34.0)
MCHC: 31.1 g/dL (ref 30.0–36.0)
MCV: 96.7 fL (ref 80.0–100.0)
Platelets: 75 10*3/uL — ABNORMAL LOW (ref 150–400)
RBC: 2.76 MIL/uL — ABNORMAL LOW (ref 4.22–5.81)
RDW: 18 % — ABNORMAL HIGH (ref 11.5–15.5)
WBC: 3.8 10*3/uL — ABNORMAL LOW (ref 4.0–10.5)
nRBC: 0 % (ref 0.0–0.2)

## 2019-06-05 LAB — MAGNESIUM: Magnesium: 2.3 mg/dL (ref 1.7–2.4)

## 2019-06-05 MED ORDER — METOCLOPRAMIDE HCL 5 MG/ML IJ SOLN
5.0000 mg | Freq: Two times a day (BID) | INTRAMUSCULAR | Status: DC
  Administered 2019-06-05: 5 mg via INTRAVENOUS
  Filled 2019-06-05: qty 2

## 2019-06-05 MED ORDER — METHYLNALTREXONE BROMIDE 12 MG/0.6ML ~~LOC~~ SOLN
12.0000 mg | Freq: Once | SUBCUTANEOUS | Status: AC
  Administered 2019-06-05: 12 mg via SUBCUTANEOUS
  Filled 2019-06-05: qty 0.6

## 2019-06-05 MED ORDER — ACETAMINOPHEN 160 MG/5ML PO SOLN: 650.0000 mg | Freq: Four times a day (QID) | ORAL | Status: DC

## 2019-06-05 MED ORDER — TRAMADOL 5 MG/ML ORAL SUSPENSION: 50.0000 mg | Freq: Four times a day (QID) | ORAL | Status: DC | PRN

## 2019-06-05 MED ORDER — TRAMADOL HCL 50 MG PO TABS
50.0000 mg | ORAL_TABLET | Freq: Four times a day (QID) | ORAL | Status: DC | PRN
  Administered 2019-06-06 – 2019-06-21 (×30): 50 mg via ORAL
  Filled 2019-06-05 (×32): qty 1

## 2019-06-05 MED ORDER — DOBUTAMINE IN D5W 4-5 MG/ML-% IV SOLN
5.0000 ug/kg/min | INTRAVENOUS | Status: DC
  Administered 2019-06-05 – 2019-06-19 (×7): 5 ug/kg/min via INTRAVENOUS
  Filled 2019-06-05 (×6): qty 250

## 2019-06-05 MED ORDER — K PHOS MONO-SOD PHOS DI & MONO 155-852-130 MG PO TABS: 250.0000 mg | ORAL_TABLET | Freq: Every day | ORAL | Status: DC

## 2019-06-05 MED ORDER — GABAPENTIN 250 MG/5ML PO SOLN
100.0000 mg | Freq: Every day | ORAL | Status: DC
  Administered 2019-06-06 – 2019-06-20 (×15): 100 mg via ORAL
  Filled 2019-06-05 (×16): qty 2

## 2019-06-05 MED ORDER — FENTANYL 12 MCG/HR TD PT72
1.0000 | MEDICATED_PATCH | TRANSDERMAL | Status: DC
  Administered 2019-06-05 – 2019-06-20 (×6): 1 via TRANSDERMAL
  Filled 2019-06-05 (×6): qty 1

## 2019-06-05 NOTE — Progress Notes (Signed)
RT Note: Pt placed back on full support ventilation due to complaints of SOB and chest pain. RN aware.

## 2019-06-05 NOTE — Progress Notes (Signed)
[facility] Progress Note   [facility] with [facility]B, new low EF and NSTEMI txd to [facility] 3/29: 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 Admission Scr was 0.99.  Assessment/ Plan:   1. Acute hypoxic respiratory failure on 05/23/19- on vent via trach. S/p right chest tube. Ventilation status unchanged. 2. Oliguric/anuricAKI- CRRT started on 4/10 and stopped 4/29 w/ attempted transition to IHD on 05/17/19 but failed and now back on CRRT 1. Remains anuric. 2. Resumed CRRT on 05/23/19 following acute worsening of his oxygenation and hypotension.  3. Dialysate 4K/2.5 Ca for replacement fluids and dialysate. no heparin.500/300/1000 pre/post/dialysate.  4. Keeping even (to 10-37m / hr +) for now. 5. Plan on stopping  CRRT Sat early afternoon and then will reassess over weekend and Monday. Would like to see if there is any UOP but may be more wishful thinking. Certainly would not be able to tolerate iHD.  3. Vascular access-LIJ tunneledcatheter placed by IR on 05/19/19 (actually already tunneled)  4. Anemia of CKD, multifactorial-transfusions per primary team. Hemoglobin acceptable today. Darbo 1057m started 5/11. CTM and consider uptitrating as needed 5. CAD- 3 vessel with EF 25% s/p CABG and MVR 4/7/1/06omplicated by cardiogenic shock as below 6. Acute systolic CHF and cardiogenic shock- still on pressors. Volume improved with CRRT/IHD. On pretty max midodrine as well, unlike to benefit from higher dose. 7. Elytes- Monitor phos daily. K acceptable today.  8. MR s/p MVR 9. Severe debility- will likely need LTAC if survives. GOBottineaueeting possibly in the works   Subjective:   Overall unchanged and no acute issues overnight.Pressures still very tenuous and remains on Dobutamine 2.5 and Levo.    Objective:   BP 113/67   Pulse 68   Temp (!) 97 F (36.1  C) (Oral)   Resp (!) 25   Ht 5' 11"  (1.803 m) Comment: measured x 3  Wt 66.7 kg   SpO2 96%   BMI 20.51 kg/m   Intake/Output Summary (Last 24 hours) at 06/05/2019 [id number] data filed at 06/05/2019 0700 Gross per 24 hour  Intake 764.47 ml  Output 239 ml  Net 525.47 ml   Weight change: 1.9 kg  Physical Exam: Gen: frail, thin, cachectic, alert Neck: lef IJTunneled cath(placed 5/3) CVS: normal rate Resp:Coarse bilateral breath sounds, ventilated/trached, rt sided CT Abd: +BS, soft, NT/ND Ext: Trace bilateral lower extremity edema at the ankle- atrophied legs  Imaging: DG Abd 1 View  Result Date: 06/04/2019 CLINICAL DATA:  Feeding tube placement EXAM: ABDOMEN - 1 VIEW COMPARISON:  05/30/2019 FINDINGS: Soft feeding tube enters the stomach and passes into the duodenum with its tip at the junction of the third and fourth portions. IMPRESSION: Feeding tube tip in the duodenum at the junction of the third and fourth portions. Electronically Signed   By: MaNelson Chimes.D.   On: 06/04/2019 12:51   DG Chest Port 1 View  Result Date: 06/04/2019 CLINICAL DATA:  CABG/tracheostomy. EXAM: PORTABLE CHEST 1 VIEW COMPARISON:  06/03/2019 FINDINGS: Tracheostomy tube unchanged. Enteric tube courses into the stomach and off the film as tip is not visualized. Right-sided PICC line has been pulled back slightly and has tip at the cavoatrial junction. Left IJ dialysis catheter with tip just below the cavoatrial junction. Right-sided chest tube unchanged. Lungs are adequately inflated demonstrate persistent hazy bilateral airspace process with perihilar predominance suggesting interstitial edema although multifocal  infection is possible. Possible small amount of right pleural fluid. Cardiomediastinal silhouette and remainder of the exam is unchanged. IMPRESSION: 1. Persistent bilateral hazy airspace process with perihilar predominance suggesting interstitial edema although multifocal infection is possible.  Possible small amount right pleural fluid. 2.  Tubes and lines as described. Electronically Signed   By: Marin Olp M.D.   On: 06/04/2019 07:32    Labs: BMET Recent Labs  Lab 06/02/19 0445 06/02/19 1511 06/03/19 0436 06/03/19 1518 06/04/19 0720 06/04/19 1645 06/05/19 0452  NA 136 137 137 136 135 137 136  K 4.6 5.0 4.4 4.8 4.4 4.6 4.3  CL 106 104 107 105 103 104 104  CO2 27 27 29 26 25 27 28   GLUCOSE 172* 91 113* 121* 144* 109* 140*  BUN 12 11 10 13 14 12 13   CREATININE 1.14 1.11 1.02 1.13 0.93 1.00 1.00  CALCIUM 7.9* 8.2* 7.5* 8.0* 7.5* 8.0* 7.8*  PHOS 3.1 3.1 2.2* 2.7 2.3* 2.6 1.7*   CBC Recent Labs  Lab 06/02/19 0445 06/03/19 0437 06/04/19 0442 06/05/19 0452  WBC 4.4 6.6 4.7 3.8*  HGB 7.9* 8.2* 7.8* 8.3*  HCT 25.5* 26.4* 25.7* 26.7*  MCV 97.3 98.1 98.5 96.7  PLT 114* 99* 97* 75*    Medications:    . aspirin EC  325 mg Oral Daily   Or  . aspirin  324 mg Per Tube Daily  . B-complex with vitamin C  1 tablet Per Tube Daily  . chlorhexidine gluconate (MEDLINE KIT)  15 mL Mouth Rinse BID  . Chlorhexidine Gluconate Cloth  6 each Topical Q0600  . citalopram  10 mg Per Tube Daily  . collagenase   Topical Daily  . darbepoetin (ARANESP) injection - NON-DIALYSIS  100 mcg Subcutaneous Q Tue-1800  . docusate  200 mg Per Tube Daily  . feeding supplement (PRO-STAT SUGAR FREE 64)  30 mL Per Tube BID  . feeding supplement (VITAL 1.5 CAL)  1,000 mL Per Tube Q24H  . [START ON 06/06/2019] gabapentin  100 mg Oral QHS  . insulin aspart  0-6 Units Subcutaneous Q4H  . mouth rinse  15 mL Mouth Rinse 10 times per day  . melatonin  3 mg Per Tube QHS  . metoCLOPramide (REGLAN) injection  5 mg Intravenous Q6H  . midodrine  20 mg Per Tube Q8H  . neostigmine  0.25 mg Subcutaneous BID  . pantoprazole sodium  40 mg Per Tube BID  . polyethylene glycol  17 g Oral Daily  . QUEtiapine  25 mg Oral QHS  . rOPINIRole  0.5 mg Oral QHS  . sennosides  5 mL Per Tube BID  . sodium chloride  flush  10-40 mL Intracatheter Q12H  . sorbitol  30 mL Per Tube q morning - 10a      Otelia Santee, MD 06/05/2019, 9:04 AM

## 2019-06-05 NOTE — Progress Notes (Signed)
Blood glucose rechecked after giving d50 and CBG result is 136.

## 2019-06-05 NOTE — Progress Notes (Signed)
RT Note: Pt placed on PSV 12/5 and VTs were in the low 200's. PS increased to 15 and VTs remained in the low 200's. Pt placed back on full support. RN aware. RT will try to wean later today.

## 2019-06-05 NOTE — Progress Notes (Signed)
CBG was 69 therefore 12.5 g D50 given PRN and then CBG rechecked and resulted 126.

## 2019-06-05 NOTE — Progress Notes (Addendum)
Patient ID: Brendan [name], male   DOB: Apr 03, 1955, 64 y.o.   MRN: [id number]     Advanced Heart Failure Rounding Note  PCP-Cardiologist: No primary care provider on file.   Subjective:    Remains on vent through trach. On CVVHD.   NE currently off but cuff MAPs low in the 50s.    Remains on dobutamine 2.5. Co-ox 66%. CVP 9. Wt is up 20 lb.   Objective:   Weight Range: 66.7 kg Body mass index is 20.51 kg/m.   Vital Signs:   Temp:  [97 F (36.1 C)-97.7 F (36.5 C)] 97 F (36.1 C) (05/20 0030) Pulse Rate:  [55-81] 69 (05/20 1015) Resp:  [0-26] 26 (05/20 1015) BP: (66-113)/(39-92) 92/62 (05/20 1015) SpO2:  [81 %-100 %] 100 % (05/20 1015) FiO2 (%):  [40 %] 40 % (05/20 0737) Weight:  [66.7 kg] 66.7 kg (05/20 0500) Last BM Date: 06/05/19  Weight change: Filed Weights   06/03/19 0545 06/04/19 0500 06/05/19 0500  Weight: 63.7 kg 64.8 kg 66.7 kg    Intake/Output:   Intake/Output Summary (Last 24 hours) at 06/05/2019 1024 Last data filed at 06/05/2019 1000 Gross per 24 hour  Intake 845.7 ml  Output 220 ml  Net 625.7 ml      Physical Exam   CVP 9 General:  Weak/fatigue appearing WM, laying in bed. Cachetic HEENT: normal + cortrak Neck: supple. +TC  Carotids 2+ bilat; no bruits. No lymphadenopathy or thryomegaly appreciated. Cor: PMI nondisplaced. Regular rate & rhythm. No rubs, gallops or murmurs. + tunneled cath Lungs: coarse bilaterally  Abdomen: soft, nontender, nondistended. No hepatosplenomegaly. No bruits or masses. Good bowel sounds. Extremities: no cyanosis, clubbing, rash, thin extremities + edema Neuro: alert & orientedx3, cranial nerves grossly intact. moves all 4 extremities w/o difficulty. Affect pleasant   Telemetry   NSR 80s Personally reviewed   Labs    CBC Recent Labs    06/04/19 0442 06/05/19 0452  WBC 4.7 3.8*  HGB 7.8* 8.3*  HCT 25.7* 26.7*  MCV 98.5 96.7  PLT 97* 75*   Basic Metabolic Panel Recent Labs    06/04/19 0720  06/04/19 0720 06/04/19 1645 06/05/19 0452  NA 135   < > 137 136  K 4.4   < > 4.6 4.3  CL 103   < > 104 104  CO2 25   < > 27 28  GLUCOSE 144*   < > 109* 140*  BUN 14   < > 12 13  CREATININE 0.93   < > 1.00 1.00  CALCIUM 7.5*   < > 8.0* 7.8*  MG 2.2  --   --  2.3  PHOS 2.3*   < > 2.6 1.7*   < > = values in this interval not displayed.   Liver Function Tests Recent Labs    06/04/19 1645 06/05/19 0452  ALBUMIN 2.2* 1.9*   No results for input(s): LIPASE, AMYLASE in the last 72 hours. Cardiac Enzymes No results for input(s): CKTOTAL, CKMB, CKMBINDEX, TROPONINI in the last 72 hours.  BNP: BNP (last 3 results) Recent Labs    04/05/2019 0113  BNP 655.7*    ProBNP (last 3 results) No results for input(s): PROBNP in the last 8760 hours.   D-Dimer No results for input(s): DDIMER in the last 72 hours. Hemoglobin A1C No results for input(s): HGBA1C in the last 72 hours. Fasting Lipid Panel No results for input(s): CHOL, HDL, LDLCALC, TRIG, CHOLHDL, LDLDIRECT in the last 72 hours. Thyroid Function  Tests No results for input(s): TSH, T4TOTAL, T3FREE, THYROIDAB in the last 72 hours.  Invalid input(s): FREET3  Other results:   Imaging    DG Abd 1 View  Result Date: 06/04/2019 CLINICAL DATA:  Feeding tube placement EXAM: ABDOMEN - 1 VIEW COMPARISON:  05/30/2019 FINDINGS: Soft feeding tube enters the stomach and passes into the duodenum with its tip at the junction of the third and fourth portions. IMPRESSION: Feeding tube tip in the duodenum at the junction of the third and fourth portions. Electronically Signed   By: Nelson Chimes M.D.   On: 06/04/2019 12:51     Medications:     Scheduled Medications: . aspirin EC  325 mg Oral Daily   Or  . aspirin  324 mg Per Tube Daily  . B-complex with vitamin C  1 tablet Per Tube Daily  . chlorhexidine gluconate (MEDLINE KIT)  15 mL Mouth Rinse BID  . Chlorhexidine Gluconate Cloth  6 each Topical Q0600  . citalopram  10 mg Per  Tube Daily  . collagenase   Topical Daily  . darbepoetin (ARANESP) injection - NON-DIALYSIS  100 mcg Subcutaneous Q Tue-1800  . docusate  200 mg Per Tube Daily  . feeding supplement (PRO-STAT SUGAR FREE 64)  30 mL Per Tube BID  . feeding supplement (VITAL 1.5 CAL)  1,000 mL Per Tube Q24H  . [START ON 06/06/2019] gabapentin  100 mg Oral QHS  . insulin aspart  0-6 Units Subcutaneous Q4H  . mouth rinse  15 mL Mouth Rinse 10 times per day  . melatonin  3 mg Per Tube QHS  . metoCLOPramide (REGLAN) injection  5 mg Intravenous Q6H  . midodrine  20 mg Per Tube Q8H  . neostigmine  0.25 mg Subcutaneous BID  . pantoprazole sodium  40 mg Per Tube BID  . polyethylene glycol  17 g Oral Daily  . QUEtiapine  25 mg Oral QHS  . rOPINIRole  0.5 mg Oral QHS  . sennosides  5 mL Per Tube BID  . sodium chloride flush  10-40 mL Intracatheter Q12H  . sorbitol  30 mL Per Tube q morning - 10a    Infusions: .  prismasol BGK 4/2.5 500 mL/hr at 06/05/19 3149  .  prismasol BGK 4/2.5 300 mL/hr at 06/05/19 7026  . sodium chloride 10 mL/hr at 06/02/19 1500  . sodium chloride Stopped (05/31/19 0958)  . dextrose 5 % and 0.9% NaCl Stopped (06/02/19 1047)  . DOBUTamine 2.5 mcg/kg/min (06/05/19 1000)  . heparin    . norepinephrine (LEVOPHED) Adult infusion 2 mcg/min (06/05/19 1000)  . prismasol BGK 4/2.5 1,000 mL/hr at 06/05/19 0417    PRN Medications: sodium chloride, alum & mag hydroxide-simeth, dextrose, diphenhydrAMINE, fentaNYL (SUBLIMAZE) injection, Gerhardt's butt cream, heparin, heparin, levalbuterol, ondansetron (ZOFRAN) IV, pneumococcal 23 valent vaccine, sodium chloride flush     Assessment/Plan   1. Acute systolic HF ->  Cardiogenic Shock  - Due to iCM. EF 20-25% - Impella 5.5 placed on 4/2.   - s/p CABG/MVRepair on 4/6  - Impella out 4/9.  - Echo 4/10 with EF 25-30%, normal RV, no MR.  - Echo 4/22 EF 30-35% mildly reduced RV MVR ok   - Dobutamine added 5/14 for low co-ox 39% and inability to  wean NE.  - Has responded well to dobutamine 2.5. Co-ox remains 66% - Will need to restart NE w/ low MAPs. Titrate to keep MAP > 65  - Repeat echo 05/29/19. EF 35-40% RV ok.  -  Degree of cardiogenic shock seems out of proportion to LV/RV function. Unfortunately every time we try to wean NE his co-ox drops markedly and he has clearly become inotrope dependent. Now seems to be responding to dobutamine. However still unable to wean NE to off. Continue midodrine 20 mg tid     2. 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 Echo stable - No s/s ischemia - On ASA/Crestor  3. Acute hypoxic respiratory failure - Due to pulmonary edema and PNA - Extubated 4/9 re-intubated on 4/12. Failed re-extubation on 4/14. Now s/p tracheostomy - S/P Bronch 4/27 - 60K yeast. -> ? Colonization.  - CT scan 5/7 with diffuse bilateral infiltrates c/w ARDS and large bilateral effusions R>L.  - s/p R chest tube - Remains on vent FiO2 40% - CCM following.Planning PS today and possible TC trials   4. Mitral Regurgitation - Mod-severe on ECHO - s/p MV repair, TEE 4/9 with minimal MR s/p repair.   - Echo 4/10 with no significant MR.  - echo 4/22 MVR stable - Repeat echo 05/29/19. EF 35-40% RV ok.  MVR ok  5. Uncontrolled DM - Hgb A1C 9.  - On insulin and sliding scale.  - No change  6. AKI - due to shock - Remains anuric post-op - Continue CVVHD per Nephrology. Plan is to keep slightly net + to help w/ BP to allow NE wean.  However he is now up 20 pounds. Likely needs to be a bit negative. Will d/w Nephrology  7. Acute blood loss Anemia  - Transfused 1UPRBCs 5/9/21and 5/14, 5/17 and 5/19  - hgb 8.3 today.  - No obvious site of bleeding.    8. Polymorphic VT - Recurrent VT during respiratory distress 4/14, - Off amio  - No further VT.  - Keep K> 4.0 Mg > 2.0  - Rhythm stable   9. Severe Malnutrition - Prealbumin improved, up from 8.9>>18.7 - Nutrition on board - Continue w/  TFs - Likely needs PEG  10. Atrial fibrillation. paroxysmal - remains in NSR - Amiodarone was stopped.   1. Debility, severe - as above will likely need LTAC on d/c - Continue PT/OT  12. Chronic pain issues - appears comfortable - having severe constipation. CCM managing  - On Oxy q4 PRN. Titrate down slowly. Can we get to methadone?     Lyda Jester, PA-C  10:24 AM  Agree with above.   In bed awake. Remains on vent through trach,. Secretions decreased,. Remains on CVVHD keeping even. Weight up another 5 pounds now 25 pounds up from nadir. Got 1u RBCs yesterday.   Remains on dobutamine 2.5. Off NE overnight but now SBP in 60-70 range. Co-ox 65%  General:  Weak appearing. No resp difficulty HEENT: normal Neck: supple. + track. Carotids 2+ bilat; no bruits. No lymphadenopathy or thryomegaly appreciated. Cor: PMI nondisplaced. Regular rate & rhythm. No rubs, gallops or murmurs. + HD cath Lungs: crackles at bases Abdomen: soft, nontender, nondistended. No hepatosplenomegaly. No bruits or masses. Good bowel sounds. Extremities: no cyanosis, clubbing, rash, 1+ edema Neuro: alert & orientedx3, cranial nerves grossly intact. moves all 4 extremities w/o difficulty. Affect pleasant  Was off NE overnight but SBP in 60-70s this am when I was seeing him. NE had to be restarted. Still requiring dual pressors despite RBC transfusion and allowing dry weight to drift up.   He remains vent, HD and pressor dependent despite max efforts. I  continue to be concerned that there may not be a clear path forward for hi,m. I will increase dobutamine to 5 mcg/kg/min in an effort to get him down to one pressor and get NE off.   CRITICAL CARE Performed by: Glori Bickers  Total critical care time: 45 minutes  Critical care time was exclusive of separately billable procedures and treating other patients.  Critical care was necessary to treat or prevent imminent or life-threatening  deterioration.  Critical care was time spent personally by me (independent of midlevel providers or residents) on the following activities: development of treatment plan with patient and/or surrogate as well as nursing, discussions with consultants, evaluation of patient's response to treatment, examination of patient, obtaining history from patient or surrogate, ordering and performing treatments and interventions, ordering and review of laboratory studies, ordering and review of radiographic studies, pulse oximetry and re-evaluation of patient's condition.  Glori Bickers, MD  9:15 PM

## 2019-06-05 NOTE — Progress Notes (Signed)
Patient had vomiting episode x2. Contents were bile colored. Zofran given. Airway suctioned, oral pharynx suctioned well. Trach care done. Tube feeds on hold for now.

## 2019-06-05 NOTE — Progress Notes (Signed)
PRN 12.5 g d50 given to patient for CBG of 67.

## 2019-06-05 NOTE — Progress Notes (Signed)
Assisted tele visit to patient with daughter, Lawerance Bach, and Palliative Care.  Maryelizabeth Rowan, RN

## 2019-06-05 NOTE — Progress Notes (Signed)
EVENING ROUNDS NOTE :     [address]       [place] [address]             [phone]                 34 Days Post-Op Procedure(s) (LRB): VIDEO BRONCHOSCOPY USING DISPOSABLE ANESTHESIA SCOPE (N/A) TRACHEOSTOMY (N/A)   Total Length of Stay:  LOS: 52 days  Events:  No events Back on the vent     BP 115/71   Pulse 72   Temp (!) 97.5 F (36.4 C)   Resp 17   Ht 5\' 11"  (1.803 m) Comment: measured x 3  Wt 66.7 kg   SpO2 95%   BMI 20.51 kg/m   CVP:  [1 mmHg-22 mmHg] 8 mmHg  Vent Mode: PRVC FiO2 (%):  [40 %] 40 % Set Rate:  [25 bmp] 25 bmp Vt Set:  [560 mL] 560 mL PEEP:  [5 cmH20] 5 cmH20 Pressure Support:  [10 cmH20] 10 cmH20 Plateau Pressure:  [16 cmH20-31 cmH20] 28 cmH20  .  prismasol BGK 4/2.5 500 mL/hr at 06/05/19 0624  .  prismasol BGK 4/2.5 300 mL/hr at 06/05/19 7290  . sodium chloride 10 mL/hr at 06/02/19 1500  . sodium chloride Stopped (05/31/19 0958)  . dextrose 5 % and 0.9% NaCl 10 mL/hr at 06/05/19 1600  . DOBUTamine 2.5 mcg/kg/min (06/05/19 1600)  . heparin    . norepinephrine (LEVOPHED) Adult infusion 4 mcg/min (06/05/19 1600)  . prismasol BGK 4/2.5 1,000 mL/hr at 06/05/19 1437    I/O last 3 completed shifts: In: 1535.7 [I.V.:160.7; Blood:315; NG/GT:1060] Out: 105 [Other:45; Chest Tube:60]   CBC Latest Ref Rng & Units 06/05/2019 06/04/2019 06/03/2019  WBC 4.0 - 10.5 K/uL 3.8(L) 4.7 6.6  Hemoglobin 13.0 - 17.0 g/dL 8.3(L) 7.8(L) 8.2(L)  Hematocrit 39.0 - 52.0 % 26.7(L) 25.7(L) 26.4(L)  Platelets 150 - 400 K/uL 75(L) 97(L) 99(L)    BMP Latest Ref Rng & Units 06/05/2019 06/05/2019 06/04/2019  Glucose 70 - 99 mg/dL 139(H) 140(H) 109(H)  BUN 8 - 23 mg/dL 11 13 12   Creatinine 0.61 - 1.24 mg/dL 0.98 1.00 1.00  Sodium 135 - 145 mmol/L 138 136 137  Potassium 3.5 - 5.1 mmol/L 4.4 4.3 4.6  Chloride 98 - 111 mmol/L 106 104 104  CO2 22 - 32 mmol/L 26 28 27   Calcium 8.9 - 10.3 mg/dL 8.0(L) 7.8(L) 8.0(L)    ABG    Component Value  Date/Time   PHART 7.426 06/02/2019 0443   PCO2ART 39.8 06/02/2019 0443   PO2ART 119 (H) 06/02/2019 0443   HCO3 26.3 06/02/2019 0443   TCO2 27 06/02/2019 0443   ACIDBASEDEF 3.0 (H) 04/28/2019 0735   O2SAT 65.8 06/05/2019 0452       Melodie Bouillon, MD 06/05/2019 5:29 PM

## 2019-06-05 NOTE — Progress Notes (Signed)
Patient's wife Cecille Rubin called and was updated.  All questions answered.

## 2019-06-05 NOTE — Progress Notes (Signed)
RT Note: Pt placed on PSV 10/5 and is tolerating well at this time. RN and Dr Nils Pyle at bedside. RT to continue to monitor.

## 2019-06-05 NOTE — Progress Notes (Addendum)
34 Days Post-Op Procedure(s) (LRB): VIDEO BRONCHOSCOPY USING DISPOSABLE ANESTHESIA SCOPE (N/A) TRACHEOSTOMY (N/A) Subjective: Patient had stable night on ventilator and remains off norepinephrine in sinus rhythm Remains sedated after p.m. doses of Seroquel and gabapentin and morning dose of oxycodone Brachial A-line has been removed-patient has tunneled dialysis catheter and PICC line for access We will try to transition to pressure support/CPAP when his sedation wears off   Objective: Vital signs in last 24 hours: Temp:  [97 F (36.1 C)-97.7 F (36.5 C)] 97 F (36.1 C) (05/20 0030) Pulse Rate:  [55-81] 68 (05/20 0737) Cardiac Rhythm: Normal sinus rhythm (05/20 0400) Resp:  [0-26] 25 (05/20 0737) BP: (66-113)/(39-92) 113/67 (05/20 0737) SpO2:  [81 %-99 %] 96 % (05/20 0737) Arterial Line BP: (81-106)/(40-46) 81/40 (05/19 1015) FiO2 (%):  [40 %] 40 % (05/20 0737) Weight:  [66.7 kg] 66.7 kg (05/20 0500)  Hemodynamic parameters for last 24 hours: CVP:  [6 mmHg-26 mmHg] 6 mmHg  Intake/Output from previous day: 05/19 0701 - 05/20 0700 In: 899.2 [I.V.:94.2; Blood:315; NG/GT:490] Out: 203 [Chest Tube:50] Intake/Output this shift: No intake/output data recorded.       Exam    General- alert and comfortable.  Sternal incision clean and dry with some small eschar formation at the chest tube sites    Neck- no JVD, no cervical adenopathy palpable, no carotid bruit   Lungs- clear without rales, wheezes   Cor- regular rate and rhythm, no murmur , gallop   Abdomen- soft, non-tender   Extremities - warm, non-tender, minimal edema   Neuro- oriented, appropriate, no focal weakness   Lab Results: Recent Labs    06/04/19 0442 06/05/19 0452  WBC 4.7 3.8*  HGB 7.8* 8.3*  HCT 25.7* 26.7*  PLT 97* 75*   BMET:  Recent Labs    06/04/19 1645 06/05/19 0452  NA 137 136  K 4.6 4.3  CL 104 104  CO2 27 28  GLUCOSE 109* 140*  BUN 12 13  CREATININE 1.00 1.00  CALCIUM 8.0* 7.8*     PT/INR: No results for input(s): LABPROT, INR in the last 72 hours. ABG    Component Value Date/Time   PHART 7.426 06/02/2019 0443   HCO3 26.3 06/02/2019 0443   TCO2 27 06/02/2019 0443   ACIDBASEDEF 3.0 (H) 04/28/2019 0735   O2SAT 65.8 06/05/2019 0452   CBG (last 3)  Recent Labs    06/05/19 0412 06/05/19 0449 06/05/19 0739  GLUCAP 67* 136* 88    Assessment/Plan: S/P Procedure(s) (LRB): VIDEO BRONCHOSCOPY USING DISPOSABLE ANESTHESIA SCOPE (N/A) TRACHEOSTOMY (N/A) Postoperative respiratory failure, history of treated Enterobacter pneumonia, heavy airway secretions associated with neostigmine for constipation.  Tracheostomy in place.  Plan is to transition back to trach collar  Postoperative low blood pressure related to preoperative ischemic cardiomyopathy and low EF.  Now on oral midodrine and low-dose dobutamine.  Postoperative acute renal failure now treated with CRT with goal to transition to iHD as blood pressure improves. Spoke with wife at bedside about lack of improvement in pulmonary and renal status- will call daughter  Lawerance Bach as well to  review his condition ,discuss DNR.How patient does with transition to iHD will probably be deciding factor.  Case management working on [employer] approval which will  provide options for long-term care placement    LOS: 52 days    [name] 06/05/2019

## 2019-06-05 NOTE — Progress Notes (Signed)
Blood glucose rechecked and is 121.  Tube feeds have been restarted.

## 2019-06-05 NOTE — Progress Notes (Signed)
PT Cancellation Note  Patient Details Name: Brendan [name] MRN: [id number] DOB: 03-22-1955   Cancelled Treatment:    Reason Eval/Treat Not Completed: Patient at procedure or test/unavailable(palliative meeting)   Brendan [name] 06/05/2019, 10:29 AM  Bayard Males, PT Acute Rehabilitation Services Pager: [phone] Office: [phone]

## 2019-06-05 NOTE — Progress Notes (Signed)
Palliative:  Palliative Care consult requested for goals of care discussion in this 64 y.o. male with multiple medical problems including poorly controlled diabetes complicated by neuropathy and hypertension. Patient presented to outside hospital on 3/26 and transferred to [facility] for further evaluation due to elevated troponin and newly depressed EF. During his initial work-up patient was found to have a blood sugar of 516, BUN 53, creatinine 1.2, and anion gap of 17. Patient's BNP was elevated to 8900. Troponin was elevated to 13.1 with changes noted by the team of their lateral leads as well as V1 and V2. Chest x-ray showed concerns for bilateral pulmonary opacities. Patient was initiated on aspirin, heparin drip, and IV antibiotics. Recent echo showed EF of 25%. Since admission patient required intubation on 4/1, extubated on 4/4, re-intubated 4/6 for CABG 4/6, extubated post procedure 4/7, CRRT initiated on 4/10, required re-intubation on 4/12 due to respiratory failure, tracheostomy performed on 4/16, 5/7 right chest tube placed due to enlarging effusion. Continues to require midodrine and pressors as well as CRRT and ventilator support.   I met today at Brendan [name] bedside with himself, wife Brendan [name], and daughter Brendan [name] via [facility] video conference. Brendan [name] was tearful upon my arrival. Brendan [name] has been nauseated today and has had a difficult morning again. He has had ongoing nausea/vomiting since admission. Hopeful that this improves as he is now having bowel movement and seemed to have been very constipated.   Brendan [name] was frustrated that palliative was meeting and is frustrated as she feels part of the medical team is trying to give up on her father. I explained that my only goal and agenda is to offer support and to help ensure that we all stay on the same page with Brendan [name] goals of care including the medical team and family. Brendan [name] shares that she feels he is showing some improvement and that  he wishes to continue to fight and continue with aggressive care. I did verify with Brendan [name] that these are his goals as well and he agrees.   Discussed update with need for levophed to be restarted this morning after brief holiday overnight (received unit of blood yesterday that boosted blood pressure). Also plans for CRRT holiday this weekend with monitoring of kidney function. Brendan [name] reports that she will return to be present in the hospital next week and I will return on Monday and continue with support for Brendan [name] and his family.   Discussed with Brendan [name], wife, and Dr. Prescott Gum regarding symptom management. He is allergic to tylenol so this is not an option. Dr. Prescott Gum suggests addition of tramadol to provide relief with minimal effect on blood pressure.   All questions/concerns addressed. Emotional support provided.   Exam: Thin, frail, cachectic. Distress from nausea/vomiting. HR regular rate. Breathing regular, unlabored on trach ventilator with increasing secretions. Abd flat with flexiseal and copious diarrhea.   Plan: - Recommend continued chaplain support especially to wife at bedside.  - Tramadol 50 mg per tube every 6 hours as needed.  - I will follow up with them Monday.  16 min  Brendan Sill, NP Palliative Medicine Team Pager [phone] (Please see [email or web address] for schedule) Team Phone [phone]    Greater than 50%  of this time was spent counseling and coordinating care related to the above assessment and plan

## 2019-06-05 NOTE — Progress Notes (Signed)
Glucose 66.  Gave PRN 12.5g d50 and restarted tube feeds at 20 ml/H. Will recheck CBG.

## 2019-06-05 NOTE — Progress Notes (Signed)
NAME:  Brendan [name], MRN:  [id number], DOB:  1955/01/21, LOS: 55 ADMISSION DATE:  04/11/2019, CONSULTATION DATE:  04/17/19 REFERRING MD:  Dr. Haroldine Laws, CHIEF COMPLAINT:  Respiratory distress   Brief History   44 yoM originally presented with SOB and fatigue at [facility] found to have new HFrEF, NSTEMI, and bilateral pleural effusions transferred to [facility] on 3/29 for further cardiac evaluation. 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 Started on lasix and milrinone gtts however developed NSVT. Was taken 4/1 for placement of IABP and swan for optimization prior to CABG. Was on precedex for agitation/ confusion, some concern for DTs. On the evening of 4/1, patient developed worsening respiratory distress and hypoxia, PCCM consulted for intubation and vent management.  Patient was extubated on 4/14 and developed worsening hypoxia on BiPAP. Overnight he developed pulseless VT requiring 1 minute of CPR and epinephrine to achieve ROSC. Neurovascularly intact afterward. Subsequently had increased work of breathing and worsening respiratory distress requiring intubation. Patient had tracheostomy performed on 04/23/2019. Trying to wean off ventilator.   Past Medical History  Tobacco abuse, HTN, poorly controlled diabetes, diabetic neuropathy  Significant Hospital Events   3/30 Lt heart cath/TEE performed 4/1 Intubated, RHC/IABP/PA cath 4/2 Impella placement 4/3 febrile over evening and night. Blood cultures obtained. Vanc/cefepime started. Hematuria--heparin held. 4/4 Extubated,abx transitioned to rocephin. Heparin restarted 4/6 Re-intubated for CABG/MVR 4/7 Extubated from CABG to BiPAP 4/9 Impella removed  4/9 TEE 4/10 CVC inserted &CRRT initiated  4/12 PCCMre-consulted re-intubation 4/14 Extubated to BiPAP 4/15 Re-intubated due to respiratory distress 4/16 Tracheostomy 4/27 bronch with BAL 5/7 called back for hypercarbic failure. #6 cuffed placed. Right chest tube placed for enlarging  effusion. 5/15-18 remains on vent, CVVHD  Consults:  TCTS PCCM HF Nephrology  Procedures:  R cephalic double lumen PICC LIJ tunneled HD catheter L brachial arterial line 6-0 cuffed shiley  Significant Diagnostic Tests:  3/30 R/ LHC >>  Ost LM to Mid LM lesion is 35% stenosed.  Prox LAD lesion is 90% stenosed.  Prox LAD to Mid LAD lesion is 50% stenosed.  Mid Cx lesion is 100% stenosed.  Prox RCA to Mid RCA lesion is 90% stenosed.  RPDA lesion is 95% stenosed.  LV end diastolic pressure is severely elevated.  Hemodynamic findings consistent with moderate pulmonary hypertension. 1. Severe 3 vessel obstructive CAD 2. High LV filling pressures 3. Reduced cardiac output with index 2.3 4. Moderate pulmonary HTN.  3/30TTE >> 1. Left ventricular ejection fraction, by estimation, is 30 to 35%. The left ventricle has moderately decreased function. The left ventricle demonstrates regional wall motion abnormalities.There is mild left ventricular hypertrophy. Left ventricular diastolic function could not be evaluated. There is severe hypokinesis of the left ventricular, entire inferior wall, inferoseptal wall, apical segment and lateral wall.  2. Right ventricular systolic function is hyperdynamic. The right ventricular size is normal. There is moderately elevated pulmonary artery systolic pressure.  3. Decreased posterior leaflet motion due to ischemic tethering of the mitral valve leaflets.  4. The mitral valve is abnormal. Moderate to severe mitral valve regurgitation.  5. The aortic valve is tricuspid. Aortic valve regurgitation is not visualized. Mild aortic valve sclerosis is present, with no evidence of aortic valve stenosis.  6. The inferior vena cava is normal in size with greater than 50% respiratory variability, suggesting right atrial pressure of 3 mmHg.   4/1 CT chest w/o >>Large bilateral pleural effusions with associated atelectasis.Moderate to severe  bilateral ground-glass opacities, likely reflecting pneumonia and/or edema.  4/1 RHC >> RA = 18 RV = 60/20  PA = 63/29 (42) PCW = 33 Fick cardiac output/index = 3.7/1.9 PVR = 2.5 WU FA sat = 98% PA sat = 47%, 49% PaPi = 2.2   Micro Data:  5/7 R pleural fluid neg 5/7 blood neg to date 5/6 tracheal aspirate enterobacter  Antimicrobials:    Interim history/subjective:  No events, somnolent this AM after oxycodone. Remains on dobutamine. Had BMs last night.  Objective   Blood pressure (!) 85/58, pulse (!) 59, temperature (!) 97 F (36.1 C), temperature source Oral, resp. rate 14, height _0  (1.803 m), weight 66.7 kg, SpO2 98 %. CVP:  [6 mmHg-26 mmHg] 6 mmHg  Vent Mode: PRVC FiO2 (%):  [40 %] 40 % Set Rate:  [25 bmp] 25 bmp Vt Set:  [560 mL] 560 mL PEEP:  [5 cmH20] 5 cmH20 Pressure Support:  [12 cmH20-15 cmH20] 12 cmH20 Plateau Pressure:  [16 cmH20] 16 cmH20   Intake/Output Summary (Last 24 hours) at 06/05/2019 0720 Last data filed at 06/05/2019 0700 Gross per 24 hour  Intake 899.19 ml  Output 203 ml  Net 696.19 ml   Filed Weights   06/03/19 0545 06/04/19 0500 06/05/19 0500  Weight: 63.7 kg 64.8 kg 66.7 kg    Examination:   GEN: Cachectic elderly debilitated gentleman, on mechanical ventilator, CVVHD HEENT: Tracheostomy tube in place, clean dry and intact CV: Regular rate rhythm, S1-S2 PULM: Coarse bilateral ventilated breath sounds GI: Soft nontender nondistended, +BS EXT: No significant edema NEURO: more sleepy this AM but will arouse and follow commands with enough prompting PSYCH: RASS 0 SKIN: Multiple areas skin breakdown scabbing  Labs reviewed  Resolved Problems   Agitated delirium- improved - seroquel qHS, continue melatonin - continue PRN oxycodone - re-orient, encourage day/night cycles  Assessment & Plan:   Acute on chronic hypoxemic and hypercarbic respiratory failure after CABG now s/p trach. Multifactorial etiology. Treated  Enterobacter pneumonia s/p antibiotics. Persistent diffuse interstitial and airspace process on CXR 5/18 in setting of CHF and severe protein calorie malnutrition manifested by resultant pulmonary edema and pleural effusions. Plan: -TC as able -Chest tube management per primary  Shock- likely multifactorial etiology in setting of acute systolic CHF 2/2 iCM (most recent 5/13 EF 35-40%) s/p CABG/MVR 4/6 and cardiogenic shock. Most likely also has distributive component in setting of protein calorie malnutrition resulting in low oncotic pressure -> significant third spacing.  -Now on dobutamine, stable, continue midodrine  ESRD: oliguric/anuric AKI in setting of multifactorial shock as described above. CRRT started on 4/10 and stopped 4/29 with failed attempt to transition to iHD on 5/1. Now back on CRRT on 5/7 2/2 acute worsening of oxygenation and hypotension. He currently remains anuric. -CVVHD per nephrology, hopefully trial of iHD soon  Constipation/colonic ileus -Improving -Continue sorbitol -Continues on neostigmine at reduced dose to increase secretions, he is also on Reglan, I would add another dose of Relistor today  Dysphagia- -Likely need PEG tube at some point  Hypoglycemia, resolved -Continue tube feeds, continue to monitor  Profound muscular deconditioning -PT OT  Pancytopenia- stable, monitor  Goals of care-continue current level of care  Best practice:  Diet: Tube feeds Pain/Anxiety/Delirium protocol (if indicated): see above VAP protocol (if indicated):HOB 30 degrees  DVT prophylaxis: ASA 317m, SCDs GI prophylaxis: PPI Glucose control:SSI Mobility: BR Code Status: Full  Family Communication:per primary team Disposition:ICU pending CRRT and vent liberation   This patient is critically ill with multiple organ system failure; which,  requires frequent high complexity decision making, assessment, support, evaluation, and titration of therapies. This was  completed through the application of advanced monitoring technologies and extensive interpretation of multiple databases. During this encounter critical care time was devoted to patient care services described in this note for 32 minutes.  [name], MD [facility] 06/05/2019 7:20 AM

## 2019-06-06 DIAGNOSIS — Z430 Encounter for attention to tracheostomy: Secondary | ICD-10-CM

## 2019-06-06 LAB — HEPATIC FUNCTION PANEL
ALT: 14 U/L (ref 0–44)
AST: 24 U/L (ref 15–41)
Albumin: 2 g/dL — ABNORMAL LOW (ref 3.5–5.0)
Alkaline Phosphatase: 393 U/L — ABNORMAL HIGH (ref 38–126)
Bilirubin, Direct: 0.2 mg/dL (ref 0.0–0.2)
Indirect Bilirubin: 0.8 mg/dL (ref 0.3–0.9)
Total Bilirubin: 1 mg/dL (ref 0.3–1.2)
Total Protein: 5.6 g/dL — ABNORMAL LOW (ref 6.5–8.1)

## 2019-06-06 LAB — AMYLASE: Amylase: 26 U/L — ABNORMAL LOW (ref 28–100)

## 2019-06-06 LAB — COOXEMETRY PANEL
Carboxyhemoglobin: 1.9 % — ABNORMAL HIGH (ref 0.5–1.5)
Methemoglobin: 1.2 % (ref 0.0–1.5)
O2 Saturation: 68.2 %
Total hemoglobin: 10 g/dL — ABNORMAL LOW (ref 12.0–16.0)

## 2019-06-06 LAB — MAGNESIUM: Magnesium: 2.3 mg/dL (ref 1.7–2.4)

## 2019-06-06 LAB — RENAL FUNCTION PANEL
Albumin: 2.1 g/dL — ABNORMAL LOW (ref 3.5–5.0)
Albumin: 2.4 g/dL — ABNORMAL LOW (ref 3.5–5.0)
Anion gap: 3 — ABNORMAL LOW (ref 5–15)
Anion gap: 5 (ref 5–15)
BUN: 10 mg/dL (ref 8–23)
BUN: 8 mg/dL (ref 8–23)
CO2: 29 mmol/L (ref 22–32)
CO2: 27 mmol/L (ref 22–32)
Calcium: 8 mg/dL — ABNORMAL LOW (ref 8.9–10.3)
Calcium: 8.2 mg/dL — ABNORMAL LOW (ref 8.9–10.3)
Chloride: 103 mmol/L (ref 98–111)
Chloride: 102 mmol/L (ref 98–111)
Creatinine, Ser: 1.04 mg/dL (ref 0.61–1.24)
Creatinine, Ser: 1.03 mg/dL (ref 0.61–1.24)
GFR calc Af Amer: >60 mL/min (ref >60)
GFR calc Af Amer: >60 mL/min (ref >60)
GFR calc non Af Amer: >60 mL/min (ref >60)
GFR calc non Af Amer: >60 mL/min (ref >60)
Glucose, Bld: 106 mg/dL — ABNORMAL HIGH (ref 70–99)
Glucose, Bld: 237 mg/dL — ABNORMAL HIGH (ref 70–99)
Phosphorus: 1.8 mg/dL — ABNORMAL LOW (ref 2.5–4.6)
Phosphorus: 2 mg/dL — ABNORMAL LOW (ref 2.5–4.6)
Potassium: 4.4 mmol/L (ref 3.5–5.1)
Potassium: 4.2 mmol/L (ref 3.5–5.1)
Sodium: 135 mmol/L (ref 135–145)
Sodium: 134 mmol/L — ABNORMAL LOW (ref 135–145)

## 2019-06-06 LAB — GLUCOSE, CAPILLARY
Glucose-Capillary: 132 mg/dL — ABNORMAL HIGH (ref 70–99)
Glucose-Capillary: 62 mg/dL — ABNORMAL LOW (ref 70–99)
Glucose-Capillary: 79 mg/dL (ref 70–99)
Glucose-Capillary: 80 mg/dL (ref 70–99)
Glucose-Capillary: 82 mg/dL (ref 70–99)
Glucose-Capillary: 94 mg/dL (ref 70–99)
Glucose-Capillary: 97 mg/dL (ref 70–99)

## 2019-06-06 LAB — CBC
HCT: 27.8 % — ABNORMAL LOW (ref 39.0–52.0)
Hemoglobin: 8.7 g/dL — ABNORMAL LOW (ref 13.0–17.0)
MCH: 30.3 pg (ref 26.0–34.0)
MCHC: 31.3 g/dL (ref 30.0–36.0)
MCV: 96.9 fL (ref 80.0–100.0)
Platelets: 98 10*3/uL — ABNORMAL LOW (ref 150–400)
RBC: 2.87 MIL/uL — ABNORMAL LOW (ref 4.22–5.81)
RDW: 17.5 % — ABNORMAL HIGH (ref 11.5–15.5)
WBC: 6.7 10*3/uL (ref 4.0–10.5)
nRBC: 0 % (ref 0.0–0.2)

## 2019-06-06 MED ORDER — DEXTROSE 10 % IV SOLN: INTRAVENOUS | Status: DC

## 2019-06-06 MED ORDER — ALBUMIN HUMAN 25 % IV SOLN
12.5000 g | Freq: Four times a day (QID) | INTRAVENOUS | Status: AC
  Administered 2019-06-06 (×3): 12.5 g via INTRAVENOUS
  Filled 2019-06-06 (×3): qty 50

## 2019-06-06 MED ORDER — METOCLOPRAMIDE HCL 5 MG/ML IJ SOLN
10.0000 mg | Freq: Four times a day (QID) | INTRAMUSCULAR | Status: DC
  Administered 2019-06-06 – 2019-06-21 (×59): 10 mg via INTRAVENOUS
  Filled 2019-06-06 (×59): qty 2

## 2019-06-06 MED ORDER — K PHOS MONO-SOD PHOS DI & MONO 155-852-130 MG PO TABS
250.0000 mg | ORAL_TABLET | Freq: Every day | ORAL | Status: AC
  Administered 2019-06-06: 250 mg via ORAL
  Filled 2019-06-06: qty 1

## 2019-06-06 MED ORDER — VITAL 1.5 CAL PO LIQD
1000.0000 mL | ORAL | Status: DC
  Administered 2019-06-06 – 2019-06-20 (×18): 1000 mL
  Filled 2019-06-06 (×20): qty 1000

## 2019-06-06 NOTE — Progress Notes (Signed)
Physical Therapy Treatment Patient Details Name: Brendan [name] MRN: [id number] DOB: 07/03/1955 Today's Date: 06/06/2019    History of Present Illness Pt presented with SOB and fatigue at [facility]HS found to have new HFrEF,  NSTEMI, and bilateral pleural effusions transferred to [facility] on 3/29 for further cardiac evaluation.  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  On the evening of 4/1, patient developed worsening respiratory distress and hypoxia, head R/L heart cath on 3/30 & R heart cath on 4/1 pt intubated on 4/1, extubated 4/4, had R & L chest tubes placed 4/2 due to bilateral pleural effusions, removed 4/5 and impella device placed 4/2.  CABG and MVR on 4/6. Extubated 4/7. Removal of impella 4/9. CVVH started 4/10 and off 4/30. Respiratory distress and reintubated 4/12. Extubated 4/14. Reintubated 4/15. Trach 4/16.  CRRT off 4/29 then restarted 5/7. Trach collar 5/2 with return to vent 5/7. PMHx: Tobacco abuse, HTN, poorly controlled DM, diabetic neuropathy.    PT Comments    Pt pleasant, awake and willing to participate. Pt reporting right sided chest pain limiting activity and function. Pt able to perform seated HEP and BP tolerated full chair position with pt sitting 13 min during session and maintained chair position end of session. Will continue to work toward standing and progressive mobility as cardiopulmonary function allows.   Supine 74/55 (63) Sitting 71/56 (63) 10 min sitting 85/51 (62) Vent CPAP, fio2 40%   Follow Up Recommendations  LTACH     Equipment Recommendations  None recommended by PT    Recommendations for Other Services       Precautions / Restrictions Precautions Precautions: Sternal;Fall Precaution Comments: trach on vent, chest tube, flexiseal, CRRT    Mobility  Bed Mobility   Bed Mobility: Supine to Sit           General bed mobility comments: elevated bed to transition from supine to sit with pt tolerating without dizziness  Transfers                 General transfer comment: pt denied attempting to stand  Ambulation/Gait                 Stairs             Wheelchair Mobility    Modified Rankin (Stroke Patients Only)       Balance                                            Cognition Arousal/Alertness: Awake/alert Behavior During Therapy: Flat affect Overall Cognitive Status: Within Functional Limits for tasks assessed                                 General Comments: pt mouthing responses and following 1 step commands      Exercises General Exercises - Lower Extremity Ankle Circles/Pumps: AROM;Both;Seated;15 reps Long Arc Quad: AAROM;AROM;Seated;15 reps(AAROm on LLE) Heel Slides: AROM;AAROM;Right;Left;Supine;15 reps(AAROM on LLE) Hip ABduction/ADduction: AROM;AAROM;Supine;15 reps(AAROM on LLE) Hip Flexion/Marching: AROM;AAROM;Right;Left;Seated;15 reps(AAROM on LLE) Other Exercises Other Exercises: bil forward punches UE x 15 AROM Other Exercises: RUE overhead punches AROM x 15    General Comments        Pertinent Vitals/Pain Pain Score: 10-Worst pain ever Pain Location: right chest Pain Descriptors / Indicators: Aching;Constant Pain Intervention(s): Limited activity within patient's  tolerance;Monitored during session;Repositioned    Home Living                      Prior Function            PT Goals (current goals can now be found in the care plan section) Progress towards PT goals: Progressing toward goals    Frequency    Min 2X/week      PT Plan Current plan remains appropriate    Co-evaluation              AM-PAC PT "6 Clicks" Mobility   Outcome Measure  Help needed turning from your back to your side while in a flat bed without using bedrails?: A Lot Help needed moving from lying on your back to sitting on the side of a flat bed without using bedrails?: A Lot Help needed moving to and from a bed to a chair (including a  wheelchair)?: Total Help needed standing up from a chair using your arms (e.g., wheelchair or bedside chair)?: Total Help needed to walk in hospital room?: Total Help needed climbing 3-5 steps with a railing? : Total 6 Click Score: 8    End of Session   Activity Tolerance: Patient tolerated treatment well Patient left: in bed;with call bell/phone within reach(in chair position) Nurse Communication: Mobility status PT Visit Diagnosis: Other abnormalities of gait and mobility (R26.89);Muscle weakness (generalized) (M62.81)     Time: 0175-1025 PT Time Calculation (min) (ACUTE ONLY): 17 min  Charges:  $Therapeutic Exercise: 8-22 mins                     Maija P, PT Acute Rehabilitation Services Pager: [phone] Office: [place] 06/06/2019, 8:55 AM

## 2019-06-06 NOTE — Progress Notes (Addendum)
   Palliative Medicine Inpatient Follow Up Note   Was asked by my colleague, Melonie to verify code status. I spoke to [place] and his wife, Cecille Rubin. Cecille Rubin shares that she had not spoken to any of the team members recently to make a change of code status. She expresses frustration with Reid's current situation. Offered therapeutic support through listening. I shared with her that the patient had beeen switched back to full code status which she expressed relief of.   We will continue to follow along and offer support to the patient and his family as able.   It appears through chart review and discussion with bedside nursing that Maximum is not tolerating weaning trials. He is not at a point where he would be able to safely tolerate iHD. It appears that there are plans for a CRRT holiday this weekend.   Unfortunately, physiologically Adison is overall doing quite poorly despite the medical teams best efforts.   Time Spent: 25 Greater than 50% of the time was spent in counseling and coordination of care ______________________________________________________________________________________ [place] Team Team Cell Phone: [phone] Please utilize secure chat with additional questions, if there is no response within 30 minutes please call the above phone number  Palliative Medicine Team providers are available by phone from 7am to 7pm daily and can be reached through the team cell phone.  Should this patient require assistance outside of these hours, please call the patient's attending physician.

## 2019-06-06 NOTE — Progress Notes (Addendum)
Palliative Medicine RN Note: Our office received a call from Brendan [name] [phone]), daughter of Brendan [name].   She expressed distress that Brendan [name] has been made a partial code, and she states that no one in her family agreed to any de-escalation of care or any code status modification during discussions with the care team. During the PMT video conference yesterday, the patient and family did confirm with our provider their wishes for full code, full scope care, and Tabitha today confirmed the following, specifically:  1. Yes to ventilation (he already has a trach) 2. Yes to medications 3. Yes to chest compressions 4. Yes to shocks 5. Yes to any and all other aggressive care as long as he is awake and still asking for it (which he was with our team 5/20)   I called the Cardiothoracic Extender [place] for guidance. We discussed the call from [place] & the family's very explicit, very consistent wishes for full code, full scope care. He gave the order to change Brendan [name] code status back to full code.   Marjie Skiff Henslee, RN, BSN, [facility] Palliative Medicine Team 06/06/2019 3:01 PM Office [phone]   Called RN Emer to update. She reports that Dr Prescott Gum did speak with family this morning. Brendan [name] wife is coming to [facility], and we will have a PMT NP go speak with them to ensure our care is matching the plan Dr Prescott Gum worked out with the family.  Marjie Skiff Henslee, RN, BSN, [facility] Palliative Medicine Team 06/06/2019 3:45 PM Office [phone]

## 2019-06-06 NOTE — Progress Notes (Signed)
CBG 62 therefore 12.5 g d50 given per PRN order. Rechecked CBG and current glucose is 97.  Will call [facility] to address hypoglycemia since patient became nauseous with tube feeds being restarted.

## 2019-06-06 NOTE — Progress Notes (Addendum)
35 Days Post-Op Procedure(s) (LRB): VIDEO BRONCHOSCOPY USING DISPOSABLE ANESTHESIA SCOPE (N/A) TRACHEOSTOMY (N/A) Subjective:  Alert on vent No nausea this am- feeding tube post pyloric with benign bowel gas pattern - cont reglan for gastopareisis placed on 2 mcg norepi last pm  Talked about goals of care with family. They are not ready to discuss withdrawal of care but  Agree with modifiedcode[no CPR,shocks.]  Plan to stop CRT this weekend will clarify chances of survival. If iHD not possible hospice referral may help both patient and family  Objective: Vital signs in last 24 hours: Temp:  [97.5 F (36.4 C)-97.6 F (36.4 C)] 97.5 F (36.4 C) (05/21 0739) Pulse Rate:  [69-82] 69 (05/21 1300) Cardiac Rhythm: Normal sinus rhythm (05/21 0739) Resp:  [6-29] 18 (05/21 1300) BP: (73-115)/(42-79) 73/46 (05/21 1300) SpO2:  [86 %-100 %] 99 % (05/21 1300) FiO2 (%):  [40 %] 40 % (05/21 1116) Weight:  [66.7 kg] 66.7 kg (05/21 0500)  Hemodynamic parameters for last 24 hours: CVP:  [5 mmHg-27 mmHg] 10 mmHg  Intake/Output from previous day: 05/20 0701 - 05/21 0700 In: 841.5 [I.V.:351.5; NG/GT:490] Out: 1861 [Stool:1750; Chest Tube:10] Intake/Output this shift: Total I/O In: 249.5 [I.V.:212; IV Piggyback:37.5] Out: 98 [Other:98]       Exam    General- alert and comfortable on vent    Neck- no JVD, no cervical adenopathy palpable, no carotid bruit   Lungs- clear without rales, wheezes   Cor- regular rate and rhythm, no murmur , gallop   Abdomen- soft, non-tender   Extremities - warm, non-tender, minimal edema, feet pink   Neuro- oriented, appropriate, no focal weakness   Lab Results: Recent Labs    06/05/19 0452 06/06/19 0427  WBC 3.8* 6.7  HGB 8.3* 8.7*  HCT 26.7* 27.8*  PLT 75* 98*   BMET:  Recent Labs    06/05/19 1530 06/06/19 0427  NA 138 135  K 4.4 4.4  CL 106 103  CO2 26 29  GLUCOSE 139* 106*  BUN 11 10  CREATININE 0.98 1.04  CALCIUM 8.0* 8.0*    PT/INR:  No results for input(s): LABPROT, INR in the last 72 hours. ABG    Component Value Date/Time   PHART 7.426 06/02/2019 0443   HCO3 26.3 06/02/2019 0443   TCO2 27 06/02/2019 0443   ACIDBASEDEF 3.0 (H) 04/28/2019 0735   O2SAT 68.2 06/06/2019 0427   CBG (last 3)  Recent Labs    06/06/19 0425 06/06/19 0757 06/06/19 1131  GLUCAP 97 79 82    Assessment/Plan: S/P Procedure(s) (LRB): VIDEO BRONCHOSCOPY USING DISPOSABLE ANESTHESIA SCOPE (N/A) TRACHEOSTOMY (N/A) Stop CRT sat as planned Resume tube feeds slowly with nausea improved norepi low dose to maintain SAP > 90 mm Hg   LOS: 53 days    Brendan [name] 06/06/2019

## 2019-06-06 NOTE — Progress Notes (Signed)
Called [place] and spoke with Jeannie Done, RN and made her aware of hypoglycemia events overnight and that patient has had N&V therefore tube feeds are on hold at this time. [facility] nurse to relay message to Dr. Oletta Darter.

## 2019-06-06 NOTE — Progress Notes (Signed)
Patient complains of nausea therefore tube feeding stopped and zofran given.

## 2019-06-06 NOTE — Progress Notes (Addendum)
Nutrition Follow-up  DOCUMENTATION CODES:   Severe malnutrition in context of chronic illness, Underweight  INTERVENTION:   If vomitng continues recommend NGT for gastric decompression. If within [place] consider long term feeding tube (possibly G-J tube).   Tube feeding:  -Vital 1.5 at 20 ml/hr via post pyloric Cortrak -Increase Q8 hours to goal rate of 60 ml/hr (1440 ml) -Pro-Stat 30 mL BID  At goal rate TF provides 127 g of protein, 2360 kcals, 1094 mL of free water  Continue B-complex with C, supplement Phosphorus, and monitor for refeeding.    NUTRITION DIAGNOSIS:   Severe Malnutrition related to chronic illness as evidenced by severe muscle depletion, severe fat depletion.  Ongoing.  GOAL:   Patient will meet greater than or equal to 90% of their needs  Met with TF.  MONITOR:   Vent status, Diet advancement, Labs, Weight trends, TF tolerance  REASON FOR ASSESSMENT:   Consult Poor PO, Assessment of nutrition requirement/status  ASSESSMENT:   64 yo male admitted with acute systeolic CHF, CAD with 3 vessel disease with plan for CABG. PMH includes DM with HgbA1c 9 with neuropathy, HTN  3/30 Cardiac Cath with severe 3-vessel CAD 3/31 ECHO: EF 30-35% 4/01 IABP placed, Intubated 4/02 Impella placed, bilateral CT placed for pleural effusions 4/04 Extubated 4/06 CABG, MV repair, Intubated 4/07 Extubated 4/09 Impella removed, LVAD placed, Cortrak placed (gastric), TF initiated 4/10 CRRT initiated 4/12 Re-intubated 4/14 Extubated 4/15 Re-intubated 4/16 Trach, Bronchoscopy 4/27 Bronch 4/29 CRRT discontinued 4/30 off vent, TC x 24 hours 5/01 1st iHD 5/07 back on CRRT and vent support 5/19 Vomiting, TF held, Cortrak advanced to post pyloric  Pt discussed during ICU rounds and with RN.   Continues on pressors. Trial trach collar today. Plan to stop CRRT tomorrow and monitor for renal recovery. Not an HD candidate per nephrology. Had "vomiting" episode again  last night. Feeding tube located in 4th portion of the duodenum. Suspect this is likely gastric content and not related to tube feeding. Reglan to be restarted.  If vomitng continues recommend NGT for gastric decompression.   Admission weight: 67.5 kg  Current weight: 66.7 kg (trending up from last week)  Patient requiring ventilator support via trach MV: 7.4 L/min Temp (24hrs), Avg:97.5 F (36.4 C), Min:97.5 F (36.4 C), Max:97.6 F (36.4 C)  I/O: +7,516 ml since 5/7 UOP: anuric CRRT: 101 ml x 24 hrs Rectal tube: 1,750 ml x 24 hrs Chest tube: 10 ml x 24 hrs   Drips: D10 @ 30 ml/hr, dobutrex, levophed  Medications: B complex with Vitamin C, aranesp, colace, SS novolog, miralax, senokot Labs: Phosphorus 1.8 (L) CBG 62-121  Diet Order:   Diet Order    None      EDUCATION NEEDS:   Education needs have been addressed  Skin:  Skin Assessment: Skin Integrity Issues: Skin Integrity Issues:: Stage II, Stage III, Incisions DTI: Coccyx, R foot Stage II: perineum Stage III: coccyx Incisions: R axilla, chest, bilateral legs  Last BM:  5/21  Height:   Ht Readings from Last 1 Encounters:  04/23/2019 5' 11"  (1.803 m)    Weight:   Wt Readings from Last 1 Encounters:  06/06/19 66.7 kg   BMI:  Body mass index is 20.51 kg/m.  Estimated Nutritional Needs:   Kcal:  2100-2300 kcals  Protein:  120-135 g  Fluid:  >/= 1.8 L/day   Mariana Single RD, LDN Clinical Nutrition Pager listed in [place]

## 2019-06-06 NOTE — Progress Notes (Signed)
NAME:  Brendan [name], MRN:  [id number], DOB:  [birth year], LOS: 85 ADMISSION DATE:  03/31/2019, CONSULTATION DATE:  04/17/19 REFERRING MD:  Dr. Haroldine Laws, CHIEF COMPLAINT:  Respiratory distress   Brief History   89 yoM originally presented with SOB and fatigue at [facility] found to have new HFrEF, NSTEMI, and bilateral pleural effusions transferred to [facility] on 3/29 for further cardiac evaluation. 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 Started on lasix and milrinone gtts however developed NSVT. Was taken 4/1 for placement of IABP and swan for optimization prior to CABG. Was on precedex for agitation/ confusion, some concern for DTs. On the evening of 4/1, patient developed worsening respiratory distress and hypoxia, PCCM consulted for intubation and vent management.  Patient was extubated on 4/14 and developed worsening hypoxia on BiPAP. Overnight he developed pulseless VT requiring 1 minute of CPR and epinephrine to achieve ROSC. Neurovascularly intact afterward. Subsequently had increased work of breathing and worsening respiratory distress requiring intubation. Patient had tracheostomy performed on 04/21/2019. Trying to wean off ventilator.   Past Medical History  Tobacco abuse, HTN, poorly controlled diabetes, diabetic neuropathy  Significant Hospital Events   3/30 Lt heart cath/TEE performed 4/1 Intubated, RHC/IABP/PA cath 4/2 Impella placement 4/3 febrile over evening and night. Blood cultures obtained. Vanc/cefepime started. Hematuria--heparin held. 4/4 Extubated,abx transitioned to rocephin. Heparin restarted 4/6 Re-intubated for CABG/MVR 4/7 Extubated from CABG to BiPAP 4/9 Impella removed  4/9 TEE 4/10 CVC inserted &CRRT initiated  4/12 PCCMre-consulted re-intubation 4/14 Extubated to BiPAP 4/15 Re-intubated due to respiratory distress 4/16 Tracheostomy 4/27 bronch with BAL 5/7 called back for hypercarbic failure. #6 cuffed placed. Right chest tube placed for enlarging  effusion. 5/15-18 remains on vent, CVVHD  Consults:  TCTS PCCM HF Nephrology  Procedures:  R cephalic double lumen PICC LIJ tunneled HD catheter L brachial arterial line 6-0 cuffed shiley  Significant Diagnostic Tests:  3/30 R/ LHC >>  Ost LM to Mid LM lesion is 35% stenosed.  Prox LAD lesion is 90% stenosed.  Prox LAD to Mid LAD lesion is 50% stenosed.  Mid Cx lesion is 100% stenosed.  Prox RCA to Mid RCA lesion is 90% stenosed.  RPDA lesion is 95% stenosed.  LV end diastolic pressure is severely elevated.  Hemodynamic findings consistent with moderate pulmonary hypertension. 1. Severe 3 vessel obstructive CAD 2. High LV filling pressures 3. Reduced cardiac output with index 2.3 4. Moderate pulmonary HTN.  3/30TTE >> 1. Left ventricular ejection fraction, by estimation, is 30 to 35%. The left ventricle has moderately decreased function. The left ventricle demonstrates regional Freeman Borba motion abnormalities.There is mild left ventricular hypertrophy. Left ventricular diastolic function could not be evaluated. There is severe hypokinesis of the left ventricular, entire inferior Zakyria Metzinger, inferoseptal Thaila Bottoms, apical segment and lateral Albirtha Grinage.  2. Right ventricular systolic function is hyperdynamic. The right ventricular size is normal. There is moderately elevated pulmonary artery systolic pressure.  3. Decreased posterior leaflet motion due to ischemic tethering of the mitral valve leaflets.  4. The mitral valve is abnormal. Moderate to severe mitral valve regurgitation.  5. The aortic valve is tricuspid. Aortic valve regurgitation is not visualized. Mild aortic valve sclerosis is present, with no evidence of aortic valve stenosis.  6. The inferior vena cava is normal in size with greater than 50% respiratory variability, suggesting right atrial pressure of 3 mmHg.   4/1 CT chest w/o >>Large bilateral pleural effusions with associated atelectasis.Moderate to severe  bilateral ground-glass opacities, likely reflecting pneumonia and/or edema.  4/1 RHC >> RA = 18 RV = 60/20  PA = 63/29 (42) PCW = 33 Fick cardiac output/index = 3.7/1.9 PVR = 2.5 WU FA sat = 98% PA sat = 47%, 49% PaPi = 2.2   Micro Data:  5/7 R pleural fluid neg 5/7 blood neg to date 5/6 tracheal aspirate enterobacter  Antimicrobials:    Interim history/subjective:  Hypoglycemic events overnight x3. TF are being held due to N/V. D10W started @ 30 mL/hr. Remains on dobutamine and Levo. Pt on CPAP/PSV this am and doing well.  Objective   Blood pressure (!) 80/45, pulse 74, temperature (!) 97.5 F (36.4 C), temperature source Oral, resp. rate 17, height _0  (1.803 m), weight 66.7 kg, SpO2 100 %. CVP:  [5 mmHg-19 mmHg] 6 mmHg  Vent Mode: CPAP;PSV FiO2 (%):  [40 %] 40 % Set Rate:  [25 bmp] 25 bmp Vt Set:  [560 mL] 560 mL PEEP:  [5 cmH20] 5 cmH20 Pressure Support:  [10 cmH20-15 cmH20] 15 cmH20 Plateau Pressure:  [24 cmH20-29 cmH20] 26 cmH20   Intake/Output Summary (Last 24 hours) at 06/06/2019 6301 Last data filed at 06/06/2019 0800 Gross per 24 hour  Intake 854.58 ml  Output 1862 ml  Net -1007.42 ml   Filed Weights   06/04/19 0500 06/05/19 0500 06/06/19 0500  Weight: 64.8 kg 66.7 kg 66.7 kg    Examination:   GEN: Cachectic elderly debilitated gentleman, on mechanical ventilator, CVVHD HEENT: Tracheostomy tube in place, clean dry and intact CV: Regular rate rhythm, S1-S2 PULM: Coarse bilateral ventilated breath sounds GI: Soft nontender nondistended, +BS EXT: Bilateral 1+ LE edema around ankles NEURO: alert and oriented; follows commands. Able to move all extremities. PSYCH: RASS 0 SKIN: Multiple areas skin breakdown scabbing  Labs reviewed  Resolved Problems   Agitated delirium- improved - seroquel qHS, continue melatonin - continue PRN oxycodone - re-orient, encourage day/night cycles  Assessment & Plan:   Acute on chronic hypoxemic and  hypercarbic respiratory failure after CABG now s/p trach. Multifactorial etiology. Treated Enterobacter pneumonia s/p antibiotics. Persistent diffuse interstitial and airspace process on CXR 5/18 in setting of CHF and severe protein calorie malnutrition manifested by resultant pulmonary edema and pleural effusions. Plan: -Continue PS as tolerated -Begin TC as able -Chest tube management per primary  Shock- likely multifactorial etiology in setting of acute systolic CHF 2/2 iCM (most recent 5/13 EF 35-40%) s/p CABG/MVR 4/6 and cardiogenic shock. Most likely also has distributive component in setting of protein calorie malnutrition resulting in low oncotic pressure -> significant third spacing.  -Now on dobutamine, Levo, stable, continue midodrine  ESRD: oliguric/anuric AKI in setting of multifactorial shock as described above. CRRT started on 4/10 and stopped 4/29 with failed attempt to transition to iHD on 5/1. Now back on CRRT on 5/7 2/2 acute worsening of oxygenation and hypotension. He currently remains anuric. -CVVHD per nephrology -Nephrology plans to stop CRRT this weekend and reassess over weekend for any UOP.  Constipation/colonic ileus -Improving -Continue sorbitol -Continues on neostigmine at reduced dose to increase secretions, he is also on Reglan, I would add another dose of Relistor today  Dysphagia- -Likely need PEG tube at some point  Hypoglycemia -Restart TFs as tolerated -Currently on D10W @ 63m/hr  Profound muscular deconditioning -PT OT  Pancytopenia- stable, monitor  Goals of care-continue current level of care  Best practice:  Diet: Tube feeds Pain/Anxiety/Delirium protocol (if indicated): see above VAP protocol (if indicated):HOB 30 degrees  DVT prophylaxis: ASA 3256m SCDs GI prophylaxis:  PPI Glucose control:SSI Mobility: BR Code Status: Full  Family Communication:per primary team Disposition:ICU pending CRRT and vent liberation

## 2019-06-06 NOTE — Progress Notes (Addendum)
RT Note: RT attempted ATC on pt but pt did not tolerate well. Pt had increased WOB with accessory muscle use and pt also complaint of trouble breathing. Pt was placed back on PSV and PS increased to 10. RN made aware.

## 2019-06-06 NOTE — Progress Notes (Signed)
Patient ID: Brendan [name], male   DOB: May 08, 1955, 64 y.o.   MRN: [id number]     Advanced Heart Failure Rounding Note  PCP-Cardiologist: No primary care provider on file.   Subjective:    Remains on vent through trach. On CVVHD keeping even.   Dobutamine up to 5 but BP dropped again and now back on NE. Co-ox 68%  Did not tolerate TC trial due to increased WOB  Objective:   Weight Range: 66.7 kg Body mass index is 20.51 kg/m.   Vital Signs:   Temp:  [97.5 F (36.4 C)-97.6 F (36.4 C)] 97.5 F (36.4 C) (05/21 0739) Pulse Rate:  [66-82] 66 (05/21 1600) Resp:  [8-27] 18 (05/21 1600) BP: (73-105)/(42-67) 84/46 (05/21 1600) SpO2:  [87 %-100 %] 100 % (05/21 1600) FiO2 (%):  [40 %] 40 % (05/21 1500) Weight:  [66.7 kg] 66.7 kg (05/21 0500) Last BM Date: 06/06/19  Weight change: Filed Weights   06/04/19 0500 06/05/19 0500 06/06/19 0500  Weight: 64.8 kg 66.7 kg 66.7 kg    Intake/Output:   Intake/Output Summary (Last 24 hours) at 06/06/2019 1706 Last data filed at 06/06/2019 1600 Gross per 24 hour  Intake 839.96 ml  Output 287 ml  Net 552.96 ml      Physical Exam   General:  Weak/fatigue appearing WM, laying in bed. Cachetic HEENT: normal + cor-trak Neck: supple. JVP 7-8. +trach Carotids 2+ bilat; no bruits. No lymphadenopathy or thryomegaly appreciated. Cor: PMI nondisplaced. Regular rate & rhythm. No rubs, gallops or murmurs. Lungs: coarse Abdomen: soft, nontender, nondistended. No hepatosplenomegaly. No bruits or masses. Good bowel sounds. Extremities: no cyanosis, clubbing, rash, 1+ edema Neuro: alert & orientedx3, cranial nerves grossly intact. moves all 4 extremities w/o difficulty. Affect flat  Telemetry   NSR 60-70s Personally reviewed   Labs    CBC Recent Labs    06/05/19 0452 06/06/19 0427  WBC 3.8* 6.7  HGB 8.3* 8.7*  HCT 26.7* 27.8*  MCV 96.7 96.9  PLT 75* 98*   Basic Metabolic Panel Recent Labs    06/05/19 0452 06/05/19 0452  06/05/19 1530 06/06/19 0427  NA 136   < > 138 135  K 4.3   < > 4.4 4.4  CL 104   < > 106 103  CO2 28   < > 26 29  GLUCOSE 140*   < > 139* 106*  BUN 13   < > 11 10  CREATININE 1.00   < > 0.98 1.04  CALCIUM 7.8*   < > 8.0* 8.0*  MG 2.3  --   --  2.3  PHOS 1.7*   < > 1.7* 1.8*   < > = values in this interval not displayed.   Liver Function Tests Recent Labs    06/06/19 0427 06/06/19 1001  AST  --  24  ALT  --  14  ALKPHOS  --  393*  BILITOT  --  1.0  PROT  --  5.6*  ALBUMIN 2.1* 2.0*   Recent Labs    06/06/19 1001  AMYLASE 26*   Cardiac Enzymes No results for input(s): CKTOTAL, CKMB, CKMBINDEX, TROPONINI in the last 72 hours.  BNP: BNP (last 3 results) Recent Labs    04/08/2019 0113  BNP 655.7*    ProBNP (last 3 results) No results for input(s): PROBNP in the last 8760 hours.   D-Dimer No results for input(s): DDIMER in the last 72 hours. Hemoglobin A1C No results for input(s): HGBA1C in the last  72 hours. Fasting Lipid Panel No results for input(s): CHOL, HDL, LDLCALC, TRIG, CHOLHDL, LDLDIRECT in the last 72 hours. Thyroid Function Tests No results for input(s): TSH, T4TOTAL, T3FREE, THYROIDAB in the last 72 hours.  Invalid input(s): FREET3  Other results:   Imaging    No results found.   Medications:     Scheduled Medications: . aspirin EC  325 mg Oral Daily   Or  . aspirin  324 mg Per Tube Daily  . B-complex with vitamin C  1 tablet Per Tube Daily  . chlorhexidine gluconate (MEDLINE KIT)  15 mL Mouth Rinse BID  . Chlorhexidine Gluconate Cloth  6 each Topical Q0600  . citalopram  10 mg Per Tube Daily  . collagenase   Topical Daily  . darbepoetin (ARANESP) injection - NON-DIALYSIS  100 mcg Subcutaneous Q Tue-1800  . docusate  200 mg Per Tube Daily  . feeding supplement (PRO-STAT SUGAR FREE 64)  30 mL Per Tube BID  . feeding supplement (VITAL 1.5 CAL)  1,000 mL Per Tube Q24H  . fentaNYL  1 patch Transdermal Q72H  . gabapentin  100 mg Oral  QHS  . insulin aspart  0-6 Units Subcutaneous Q4H  . mouth rinse  15 mL Mouth Rinse 10 times per day  . melatonin  3 mg Per Tube QHS  . metoCLOPramide (REGLAN) injection  10 mg Intravenous Q6H  . midodrine  20 mg Per Tube Q8H  . pantoprazole sodium  40 mg Per Tube BID  . polyethylene glycol  17 g Oral Daily  . QUEtiapine  25 mg Oral QHS  . rOPINIRole  0.5 mg Oral QHS  . sennosides  5 mL Per Tube BID  . sodium chloride flush  10-40 mL Intracatheter Q12H  . sorbitol  30 mL Per Tube q morning - 10a    Infusions: .  prismasol BGK 4/2.5 500 mL/hr at 06/06/19 1322  .  prismasol BGK 4/2.5 300 mL/hr at 06/06/19 1632  . sodium chloride 10 mL/hr at 06/02/19 1500  . sodium chloride Stopped (05/31/19 0958)  . albumin human Stopped (06/06/19 1500)  . dextrose 30 mL/hr at 06/06/19 1600  . dextrose 5 % and 0.9% NaCl Stopped (06/06/19 0513)  . DOBUTamine 5 mcg/kg/min (06/06/19 1600)  . heparin    . norepinephrine (LEVOPHED) Adult infusion 1 mcg/min (06/06/19 1600)  . prismasol BGK 4/2.5 1,000 mL/hr at 06/06/19 1055    PRN Medications: sodium chloride, alum & mag hydroxide-simeth, dextrose, diphenhydrAMINE, fentaNYL (SUBLIMAZE) injection, Gerhardt's butt cream, heparin, heparin, levalbuterol, ondansetron (ZOFRAN) IV, pneumococcal 23 valent vaccine, sodium chloride flush, traMADol     Assessment/Plan   1. Acute systolic HF ->  Cardiogenic Shock  - Due to iCM. EF 20-25% - Impella 5.5 placed on 4/2.   - s/p CABG/MVRepair on 4/6  - Impella out 4/9.  - Echo 4/10 with EF 25-30%, normal RV, no MR.  - Echo 4/22 EF 30-35% mildly reduced RV MVR ok   - Dobutamine added 5/14 for low co-ox 39% and inability to wean NE.  - Dobutamine increased to 5 on 5/21 due to low BP but back on NE today. Co-ox 68%  - Repeat echo 05/29/19. EF 35-40% RV ok.  - Unable to wean off inotropes    2. 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 Echo stable - No s/s ischemia - On  ASA/Crestor  3. Acute hypoxic respiratory failure - Due to pulmonary edema and  PNA - Extubated 4/9 re-intubated on 4/12. Failed re-extubation on 4/14. Now s/p tracheostomy - S/P Bronch 4/27 - 60K yeast. -> ? Colonization.  - CT scan 5/7 with diffuse bilateral infiltrates c/w ARDS and large bilateral effusions R>L.  - s/p R chest tube - Remains on vent FiO2 40% - CCM following.Failed TC trials today  4. Mitral Regurgitation - Mod-severe on ECHO - s/p MV repair, TEE 4/9 with minimal MR s/p repair.   - Echo 4/10 with no significant MR.  - echo 4/22 MVR stable - Repeat echo 05/29/19. EF 35-40% RV ok.  MVR ok  5. Uncontrolled DM - Hgb A1C 9.  - On insulin and sliding scale.  - No change  6. AKI - due to shock - Remains anuric post-op - Continue CVVHD per Nephrology. Plan is to keep slightly net + to help w/ BP to allow NE wean.  However he is now up 20 pounds. D/w Nephrology today. Plan is to stop CRT over the weekend. If unable to tolerate iHD will move toward Palliative Care  7. Acute blood loss Anemia  - Transfused 1UPRBCs 5/9/21and 5/14, 5/17 and 5/19  - hgb 8.7 today.    8. Polymorphic VT - Recurrent VT during respiratory distress 4/14, - Off amio  - No further VT.  - Keep K> 4.0 Mg > 2.0  - Rhythm stable   9. Severe Malnutrition - Prealbumin improved, up from 8.9>>18.7 - Nutrition on board - Continue w/ TFs  10. Atrial fibrillation. paroxysmal - remains in NSR - Amiodarone was stopped.   11. Debility, severe - Continue PT/OT  12. Chronic pain issues - appears comfortable - having severe constipation. CCM managing  - On Oxy q4 PRN. Titrate down slowly.   He remains vent, HD and pressor dependent despite max efforts. I increased dobutamine yesterday in an effort to wean NE but is back on NE this am.  I continue to be concerned that there may not be a clear path forward for him. I will increase dobutamine to 5 mcg/kg/min in an effort to get him down to one  pressor and get NE off.   Dr. Prescott Gum discussed goals of care with family today.   CRITICAL CARE Performed by: Glori Bickers  Total critical care time: 35 minutes  Critical care time was exclusive of separately billable procedures and treating other patients.  Critical care was necessary to treat or prevent imminent or life-threatening deterioration.  Critical care was time spent personally by me (independent of midlevel providers or residents) on the following activities: development of treatment plan with patient and/or surrogate as well as nursing, discussions with consultants, evaluation of patient's response to treatment, examination of patient, obtaining history from patient or surrogate, ordering and performing treatments and interventions, ordering and review of laboratory studies, ordering and review of radiographic studies, pulse oximetry and re-evaluation of patient's condition.    Glori Bickers, MD  5:06 PM

## 2019-06-06 NOTE — Progress Notes (Signed)
[facility] Progress Note   [facility] with [facility], new low EF and NSTEMI txd to [facility] 3/29: 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 Admission Scr was 0.99.  Assessment/ Plan:   1. Acute hypoxic respiratory failure on 05/23/19- on vent via trach. S/p right chest tube. Ventilation status unchanged. 2. Oliguric/anuricAKI- CRRT started on 4/10 and stopped 4/29 w/ attempted transition to IHD on 05/17/19 but failed and now back on CRRT 1. Remains anuric. 2. Resumed CRRT on 05/23/19 following acute worsening of his oxygenation and hypotension.  3. Dialysate 4K/2.5 Ca for replacement fluids and dialysate. no heparin.500/300/1000 pre/post/dialysate. 4. Keeping even (to 10-38m / hr +) for now. 5. Plan on stoppingCRRT Sat early afternoon and then will reassess over weekend and Monday. Would like to see if there is any UOP but may be more wishful thinking. Certainly would not be able to tolerate iHD. 6. I do not see a good endpoint unfortunately; he will not come off renal replacement and he won't tolerate iHD. Can't stay on CRRT indefinitely. Agree with goals of care.  3. Vascular access-LIJ tunneledcatheter placed by IR on 05/19/19 (actually already tunneled)  4. Anemia of CKD, multifactorial-transfusions per primary team. Hemoglobin acceptable today. Darbo 1065m started 5/11. CTM and consider uptitrating as needed 5. CAD- 3 vessel with EF 25% s/p CABG and MVR 4/4/2/70omplicated by cardiogenic shock as below 6. Acute systolic CHF and cardiogenic shock- still on pressors. Volume improved with CRRT/IHD. On pretty max midodrine as well, unlike to benefit from higher dose. 7. Elytes- Monitor phos daily. K acceptable today.  8. MR s/p MVR 9. Severe debility- will likely need LTAC if survives. GOCorintheeting possibly in the works  Subjective:   Not  tolerating NG (residual).Pressures still very tenuous and remains on Dobutamine 2.5 and Levo.    Objective:   BP (!) 80/45   Pulse 74   Temp (!) 97.5 F (36.4 C) (Oral)   Resp 17   Ht 5' 11" (1.803 m) Comment: measured x 3  Wt 66.7 kg   SpO2 100%   BMI 20.51 kg/m   Intake/Output Summary (Last 24 hours) at 06/06/2019 [id number] data filed at 06/06/2019 0800 Gross per 24 hour  Intake 854.58 ml  Output 1862 ml  Net -1007.42 ml   Weight change: 0 kg  Physical Exam: Gen: frail, thin, cachectic, alert Neck: lef IJTunneled cath(placed 5/3) CVS: normal rate Resp:Coarse bilateral breath sounds, ventilated/trached, rt sided CT Abd: +BS, soft, NT/ND Ext:  Bilateral lower extremity edema -  atrophied legs  Imaging: DG Abd 1 View  Result Date: 06/04/2019 CLINICAL DATA:  Feeding tube placement EXAM: ABDOMEN - 1 VIEW COMPARISON:  05/30/2019 FINDINGS: Soft feeding tube enters the stomach and passes into the duodenum with its tip at the junction of the third and fourth portions. IMPRESSION: Feeding tube tip in the duodenum at the junction of the third and fourth portions. Electronically Signed   By: MaNelson Chimes.D.   On: 06/04/2019 12:51    Labs: BMET Recent Labs  Lab 06/03/19 0436 06/03/19 1518 06/04/19 0720 06/04/19 1645 06/05/19 0452 06/05/19 1530 06/06/19 0427  NA 137 136 135 137 136 138 135  K 4.4 4.8 4.4 4.6 4.3 4.4 4.4  CL 107 105 103 104 104 106 103  CO2 _0 GLUCOSE 113* 121* 144* 109* 140* 139*  106*  BUN 10 13 14 12 13 11 10  CREATININE 1.02 1.13 0.93 1.00 1.00 0.98 1.04  CALCIUM 7.5* 8.0* 7.5* 8.0* 7.8* 8.0* 8.0*  PHOS 2.2* 2.7 2.3* 2.6 1.7* 1.7* 1.8*   CBC Recent Labs  Lab 06/03/19 0437 06/04/19 0442 06/05/19 0452 06/06/19 0427  WBC 6.6 4.7 3.8* 6.7  HGB 8.2* 7.8* 8.3* 8.7*  HCT 26.4* 25.7* 26.7* 27.8*  MCV 98.1 98.5 96.7 96.9  PLT 99* 97* 75* 98*    Medications:    . aspirin EC  325 mg Oral Daily   Or  . aspirin  324 mg Per  Tube Daily  . B-complex with vitamin C  1 tablet Per Tube Daily  . chlorhexidine gluconate (MEDLINE KIT)  15 mL Mouth Rinse BID  . Chlorhexidine Gluconate Cloth  6 each Topical Q0600  . citalopram  10 mg Per Tube Daily  . collagenase   Topical Daily  . darbepoetin (ARANESP) injection - NON-DIALYSIS  100 mcg Subcutaneous Q Tue-1800  . docusate  200 mg Per Tube Daily  . feeding supplement (PRO-STAT SUGAR FREE 64)  30 mL Per Tube BID  . feeding supplement (VITAL 1.5 CAL)  1,000 mL Per Tube Q24H  . fentaNYL  1 patch Transdermal Q72H  . gabapentin  100 mg Oral QHS  . insulin aspart  0-6 Units Subcutaneous Q4H  . mouth rinse  15 mL Mouth Rinse 10 times per day  . melatonin  3 mg Per Tube QHS  . metoCLOPramide (REGLAN) injection  10 mg Intravenous Q6H  . midodrine  20 mg Per Tube Q8H  . pantoprazole sodium  40 mg Per Tube BID  . polyethylene glycol  17 g Oral Daily  . QUEtiapine  25 mg Oral QHS  . rOPINIRole  0.5 mg Oral QHS  . sennosides  5 mL Per Tube BID  . sodium chloride flush  10-40 mL Intracatheter Q12H  . sorbitol  30 mL Per Tube q morning - 10a       , MD 06/06/2019, 8:36 AM   

## 2019-06-06 NOTE — Progress Notes (Signed)
Pt placed back on full support for night time rest. Pt respiratory status is stable at this time. RT will continue to monitor.

## 2019-06-06 NOTE — Progress Notes (Signed)
[facility] Progress Note Patient Name: Brendan [name] DOB: 31-May-1955 MRN: [id number]   Date of Service  06/06/2019  HPI/Events of Note  N/V - tube feeding held intermittently. Patient has had episodes of hypoglycemia X 3.   eICU Interventions  Will order: 1. Hold tube feeds. 2. D10W to run IV at 30 mL/hour.     Intervention Category Major Interventions: Other:  Sommer,Steven Cornelia Copa 06/06/2019, 5:08 AM

## 2019-06-07 ENCOUNTER — Inpatient Hospital Stay (HOSPITAL_COMMUNITY): Payer: Medicaid Other

## 2019-06-07 LAB — CBC
HCT: 25.5 % — ABNORMAL LOW (ref 39.0–52.0)
Hemoglobin: 7.8 g/dL — ABNORMAL LOW (ref 13.0–17.0)
MCH: 29.8 pg (ref 26.0–34.0)
MCHC: 30.6 g/dL (ref 30.0–36.0)
MCV: 97.3 fL (ref 80.0–100.0)
Platelets: 71 10*3/uL — ABNORMAL LOW (ref 150–400)
RBC: 2.62 MIL/uL — ABNORMAL LOW (ref 4.22–5.81)
RDW: 17.2 % — ABNORMAL HIGH (ref 11.5–15.5)
WBC: 3.6 10*3/uL — ABNORMAL LOW (ref 4.0–10.5)
nRBC: 0 % (ref 0.0–0.2)

## 2019-06-07 LAB — BASIC METABOLIC PANEL
Anion gap: 5 (ref 5–15)
BUN: 9 mg/dL (ref 8–23)
CO2: 27 mmol/L (ref 22–32)
Calcium: 8 mg/dL — ABNORMAL LOW (ref 8.9–10.3)
Chloride: 101 mmol/L (ref 98–111)
Creatinine, Ser: 0.99 mg/dL (ref 0.61–1.24)
GFR calc Af Amer: >60 mL/min (ref >60)
GFR calc non Af Amer: >60 mL/min (ref >60)
Glucose, Bld: 306 mg/dL — ABNORMAL HIGH (ref 70–99)
Potassium: 4.4 mmol/L (ref 3.5–5.1)
Sodium: 133 mmol/L — ABNORMAL LOW (ref 135–145)

## 2019-06-07 LAB — COOXEMETRY PANEL
Carboxyhemoglobin: 1.6 % — ABNORMAL HIGH (ref 0.5–1.5)
Methemoglobin: 1.3 % (ref 0.0–1.5)
O2 Saturation: 57.4 %
Total hemoglobin: 8 g/dL — ABNORMAL LOW (ref 12.0–16.0)

## 2019-06-07 LAB — RENAL FUNCTION PANEL
Albumin: 2.3 g/dL — ABNORMAL LOW (ref 3.5–5.0)
Anion gap: 4 — ABNORMAL LOW (ref 5–15)
BUN: 9 mg/dL (ref 8–23)
CO2: 28 mmol/L (ref 22–32)
Calcium: 8 mg/dL — ABNORMAL LOW (ref 8.9–10.3)
Chloride: 101 mmol/L (ref 98–111)
Creatinine, Ser: 0.92 mg/dL (ref 0.61–1.24)
GFR calc Af Amer: >60 mL/min (ref >60)
GFR calc non Af Amer: >60 mL/min (ref >60)
Glucose, Bld: 301 mg/dL — ABNORMAL HIGH (ref 70–99)
Phosphorus: 1.8 mg/dL — ABNORMAL LOW (ref 2.5–4.6)
Potassium: 4.2 mmol/L (ref 3.5–5.1)
Sodium: 133 mmol/L — ABNORMAL LOW (ref 135–145)

## 2019-06-07 LAB — GLUCOSE, CAPILLARY
Glucose-Capillary: 138 mg/dL — ABNORMAL HIGH (ref 70–99)
Glucose-Capillary: 126 mg/dL — ABNORMAL HIGH (ref 70–99)
Glucose-Capillary: 154 mg/dL — ABNORMAL HIGH (ref 70–99)
Glucose-Capillary: 126 mg/dL — ABNORMAL HIGH (ref 70–99)
Glucose-Capillary: 151 mg/dL — ABNORMAL HIGH (ref 70–99)
Glucose-Capillary: 128 mg/dL — ABNORMAL HIGH (ref 70–99)

## 2019-06-07 LAB — PHOSPHORUS: Phosphorus: 2.1 mg/dL — ABNORMAL LOW (ref 2.5–4.6)

## 2019-06-07 LAB — MAGNESIUM: Magnesium: 2.3 mg/dL (ref 1.7–2.4)

## 2019-06-07 MED ORDER — FLUDROCORTISONE ACETATE 0.1 MG PO TABS
0.1000 mg | ORAL_TABLET | Freq: Every day | ORAL | Status: DC
  Administered 2019-06-07 – 2019-06-08 (×2): 0.1 mg via ORAL
  Filled 2019-06-07 (×2): qty 1

## 2019-06-07 NOTE — Plan of Care (Signed)
  Problem: Clinical Measurements: Goal: Ability to maintain clinical measurements within normal limits will improve Outcome: Progressing   Problem: Clinical Measurements: Goal: Respiratory complications will improve Outcome: Progressing   Problem: Nutrition: Goal: Adequate nutrition will be maintained Outcome: Progressing

## 2019-06-07 NOTE — Progress Notes (Signed)
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 Admission Scr was 0.99.  Assessment/ Plan:   1. Acute hypoxic respiratory failure on 05/23/19- on vent via trach. S/p right chest tube. Ventilation status unchanged. 2. Oliguric/anuricAKI- CRRT started on 4/10 and stopped 4/29 w/ attempted transition to IHD on 05/17/19 but failed and now back on CRRT 1. Remains anuric. 2. Resumed CRRT on 05/23/19 following acute worsening of his oxygenation and hypotension.  3. Dialysate 4K/2.5 Ca for replacement fluids and dialysate. no heparin.500/300/1000 pre/post/dialysate. 4. Keeping even(to 10-64m / hr +)for past week but he has gained significant water weight. He was as low as 52kg on 4/29 and when CRRT was resumed again on 5/7 his weight has increased 10kg since then. 5. Will give trial of low net UF (~50 ml/hr) over next 24hrs; if he's not able to tolerate that then there's no way he will tolerate conventional iHD. 6. I do not see a good endpoint unfortunately; he will not come off renal replacement and unlikely to  tolerate iHD. Can't stay on CRRT indefinitely. Likely will have to continue discussions to challenge with iHD again Mon or Tues and if he doesn't tolerate then withdraw further RRT.  3. Vascular access-LIJ tunneledcatheter placed by IR on 05/19/19 (actually already tunneled)  4. Anemia of CKD, multifactorial-transfusions per primary team. Hemoglobin acceptable today. Darbo 1030m started 5/11. CTM and consider uptitrating as needed 5. CAD- 3 vessel with EF 25% s/p CABG and MVR 4/6/2/94omplicated by cardiogenic shock as below 6. Acute systolic CHF and cardiogenic shock- still on pressors. Volume incr bec of small fluid gains to support BP. 7. Elytes- Monitor phos  daily. K acceptable today.  8. MR s/p MVR 9. Severe debility- will likely need LTAC if survives. GOOnleyeeting possibly in the works  Subjective:   Pressures still very tenuous and remains onDobutamine 5 and Levo 2.   Objective:   BP (!) 85/56   Pulse 65   Temp (!) 97.5 F (36.4 C) (Oral)   Resp 18   Ht 5' 11"  (1.803 m) Comment: measured x 3  Wt 69.8 kg   SpO2 97%   BMI 21.46 kg/m   Intake/Output Summary (Last 24 hours) at 06/07/2019 [id number] data filed at 06/07/2019 0800 Gross per 24 hour  Intake 1872.95 ml  Output 1152 ml  Net 720.95 ml   Weight change: 3.1 kg  Physical Exam: Gen: frail, thin, cachectic, alert Neck: lef IJTunneled cath(placed 5/3) CVS: normal rate Resp:Coarse bilateral breath sounds, ventilated/trached, rt sided CT Abd: +BS, soft, NT/ND Ext:  Bilateral 1+ lower extremity edema -  atrophied legs  Imaging: DG Chest Port 1 View  Result Date: 06/07/2019 CLINICAL DATA:  Mitral valve repair EXAM: PORTABLE CHEST 1 VIEW COMPARISON:  06/04/2019 FINDINGS: Cardiac shadow is stable. Postsurgical changes are again seen. Tracheostomy tube, feeding catheter and left-sided dialysis catheter are again noted and stable. Right-sided PICC line and right thoracostomy tube are noted. No pneumothorax is seen. Stable opacities are noted throughout both lungs. No bony abnormality is noted. IMPRESSION: Stable appearance of the chest when compared with the prior exam. Electronically Signed   By: MaInez Catalina.D.   On: 06/07/2019 08:41    Labs: BMET Recent Labs  Lab 06/04/19 0720 06/04/19 1645 06/05/19 0452 06/05/19  1530 06/06/19 0427 06/06/19 1632 06/07/19 0319  NA 135 137 136 138 135 134* 133*  K 4.4 4.6 4.3 4.4 4.4 4.2 4.4  CL 103 104 104 106 103 102 101  CO2 25 27 28 26 29 27 27   GLUCOSE 144* 109* 140* 139* 106* 237* 306*  BUN 14 12 13 11 10 8 9   CREATININE 0.93 1.00 1.00 0.98 1.04 1.03 0.99  CALCIUM 7.5* 8.0* 7.8* 8.0* 8.0* 8.2* 8.0*  PHOS 2.3* 2.6 1.7*  1.7* 1.8* 2.0* 2.1*   CBC Recent Labs  Lab 06/04/19 0442 06/05/19 0452 06/06/19 0427 06/07/19 0319  WBC 4.7 3.8* 6.7 3.6*  HGB 7.8* 8.3* 8.7* 7.8*  HCT 25.7* 26.7* 27.8* 25.5*  MCV 98.5 96.7 96.9 97.3  PLT 97* 75* 98* 71*    Medications:    . aspirin EC  325 mg Oral Daily   Or  . aspirin  324 mg Per Tube Daily  . B-complex with vitamin C  1 tablet Per Tube Daily  . chlorhexidine gluconate (MEDLINE KIT)  15 mL Mouth Rinse BID  . Chlorhexidine Gluconate Cloth  6 each Topical Q0600  . citalopram  10 mg Per Tube Daily  . collagenase   Topical Daily  . darbepoetin (ARANESP) injection - NON-DIALYSIS  100 mcg Subcutaneous Q Tue-1800  . docusate  200 mg Per Tube Daily  . feeding supplement (PRO-STAT SUGAR FREE 64)  30 mL Per Tube BID  . feeding supplement (VITAL 1.5 CAL)  1,000 mL Per Tube Q24H  . fentaNYL  1 patch Transdermal Q72H  . gabapentin  100 mg Oral QHS  . insulin aspart  0-6 Units Subcutaneous Q4H  . mouth rinse  15 mL Mouth Rinse 10 times per day  . melatonin  3 mg Per Tube QHS  . metoCLOPramide (REGLAN) injection  10 mg Intravenous Q6H  . midodrine  20 mg Per Tube Q8H  . pantoprazole sodium  40 mg Per Tube BID  . polyethylene glycol  17 g Oral Daily  . QUEtiapine  25 mg Oral QHS  . rOPINIRole  0.5 mg Oral QHS  . sennosides  5 mL Per Tube BID  . sodium chloride flush  10-40 mL Intracatheter Q12H  . sorbitol  30 mL Per Tube q morning - 10a      Otelia Santee, MD 06/07/2019, 9:53 AM

## 2019-06-07 NOTE — Progress Notes (Signed)
Patient ID: Brendan [name], male   DOB: October 17, 1955, 64 y.o.   MRN: [id number]     Advanced Heart Failure Rounding Note  PCP-Cardiologist: No primary care provider on file.   Subjective:    Remains on vent through trach - now doing PS support trial. On CVVHD keeping even.   Remains on NE at 3. Dobutamine at 5. SBP this am in low 90s.   Weight continues to climb as we have been letting him re-accumulate some fluid to wean pressors.    Objective:   Weight Range: 69.8 kg Body mass index is 21.46 kg/m.   Vital Signs:   Temp:  [97.5 F (36.4 C)-97.8 F (36.6 C)] 97.5 F (36.4 C) (05/22 0400) Pulse Rate:  [61-70] 66 (05/22 1115) Resp:  [5-28] 18 (05/22 1115) BP: (72-99)/(40-73) 87/54 (05/22 1115) SpO2:  [84 %-100 %] 96 % (05/22 1115) FiO2 (%):  [40 %] 40 % (05/22 0727) Weight:  [69.8 kg] 69.8 kg (05/22 0500) Last BM Date: 06/06/19  Weight change: Filed Weights   06/05/19 0500 06/06/19 0500 06/07/19 0500  Weight: 66.7 kg 66.7 kg 69.8 kg    Intake/Output:   Intake/Output Summary (Last 24 hours) at 06/07/2019 1126 Last data filed at 06/07/2019 1100 Gross per 24 hour  Intake 2173.32 ml  Output 1116 ml  Net 1057.32 ml      Physical Exam   General:  Weak/fatigue appearing WM, laying in bed. Cachetic HEENT: normal +cor-trak Neck: supple. JVP 9-10 +trach. Carotids 2+ bilat; no bruits. No lymphadenopathy or thryomegaly appreciated. Cor: PMI nondisplaced. Regular rate & rhythm. No rubs, gallops or murmurs. Lungs: decreased throughout  + right CT Abdomen: soft, nontender, mildly distended. No hepatosplenomegaly. No bruits or masses. Good bowel sounds. Extremities: no cyanosis, clubbing, rash, 1-2+ edema Neuro: awake on vent. interactive, cranial nerves grossly intact. moves all 4 extremities w/o difficulty. Affect pleasant   Telemetry   NSR 60-70s Personally reviewed   Labs    CBC Recent Labs    06/06/19 0427 06/07/19 0319  WBC 6.7 3.6*  HGB 8.7* 7.8*  HCT  27.8* 25.5*  MCV 96.9 97.3  PLT 98* 71*   Basic Metabolic Panel Recent Labs    06/06/19 0427 06/06/19 0427 06/06/19 1632 06/07/19 0319  NA 135   < > 134* 133*  K 4.4   < > 4.2 4.4  CL 103   < > 102 101  CO2 29   < > 27 27  GLUCOSE 106*   < > 237* 306*  BUN 10   < > 8 9  CREATININE 1.04   < > 1.03 0.99  CALCIUM 8.0*   < > 8.2* 8.0*  MG 2.3  --   --  2.3  PHOS 1.8*   < > 2.0* 2.1*   < > = values in this interval not displayed.   Liver Function Tests Recent Labs    06/06/19 1001 06/06/19 1632  AST 24  --   ALT 14  --   ALKPHOS 393*  --   BILITOT 1.0  --   PROT 5.6*  --   ALBUMIN 2.0* 2.4*   Recent Labs    06/06/19 1001  AMYLASE 26*   Cardiac Enzymes No results for input(s): CKTOTAL, CKMB, CKMBINDEX, TROPONINI in the last 72 hours.  BNP: BNP (last 3 results) Recent Labs    03/27/2019 0113  BNP 655.7*    ProBNP (last 3 results) No results for input(s): PROBNP in the last 8760 hours.  D-Dimer No results for input(s): DDIMER in the last 72 hours. Hemoglobin A1C No results for input(s): HGBA1C in the last 72 hours. Fasting Lipid Panel No results for input(s): CHOL, HDL, LDLCALC, TRIG, CHOLHDL, LDLDIRECT in the last 72 hours. Thyroid Function Tests No results for input(s): TSH, T4TOTAL, T3FREE, THYROIDAB in the last 72 hours.  Invalid input(s): FREET3  Other results:   Imaging    DG Chest Port 1 View  Result Date: 06/07/2019 CLINICAL DATA:  Mitral valve repair EXAM: PORTABLE CHEST 1 VIEW COMPARISON:  06/04/2019 FINDINGS: Cardiac shadow is stable. Postsurgical changes are again seen. Tracheostomy tube, feeding catheter and left-sided dialysis catheter are again noted and stable. Right-sided PICC line and right thoracostomy tube are noted. No pneumothorax is seen. Stable opacities are noted throughout both lungs. No bony abnormality is noted. IMPRESSION: Stable appearance of the chest when compared with the prior exam. Electronically Signed   By: Inez Catalina M.D.   On: 06/07/2019 08:41     Medications:     Scheduled Medications: . aspirin EC  325 mg Oral Daily   Or  . aspirin  324 mg Per Tube Daily  . B-complex with vitamin C  1 tablet Per Tube Daily  . chlorhexidine gluconate (MEDLINE KIT)  15 mL Mouth Rinse BID  . Chlorhexidine Gluconate Cloth  6 each Topical Q0600  . citalopram  10 mg Per Tube Daily  . collagenase   Topical Daily  . darbepoetin (ARANESP) injection - NON-DIALYSIS  100 mcg Subcutaneous Q Tue-1800  . docusate  200 mg Per Tube Daily  . feeding supplement (PRO-STAT SUGAR FREE 64)  30 mL Per Tube BID  . feeding supplement (VITAL 1.5 CAL)  1,000 mL Per Tube Q24H  . fentaNYL  1 patch Transdermal Q72H  . gabapentin  100 mg Oral QHS  . insulin aspart  0-6 Units Subcutaneous Q4H  . mouth rinse  15 mL Mouth Rinse 10 times per day  . melatonin  3 mg Per Tube QHS  . metoCLOPramide (REGLAN) injection  10 mg Intravenous Q6H  . midodrine  20 mg Per Tube Q8H  . pantoprazole sodium  40 mg Per Tube BID  . polyethylene glycol  17 g Oral Daily  . QUEtiapine  25 mg Oral QHS  . rOPINIRole  0.5 mg Oral QHS  . sennosides  5 mL Per Tube BID  . sodium chloride flush  10-40 mL Intracatheter Q12H  . sorbitol  30 mL Per Tube q morning - 10a    Infusions: .  prismasol BGK 4/2.5 500 mL/hr at 06/06/19 2350  .  prismasol BGK 4/2.5 300 mL/hr at 06/06/19 1632  . sodium chloride 10 mL/hr at 06/02/19 1500  . sodium chloride Stopped (05/31/19 0958)  . dextrose 30 mL/hr at 06/07/19 1100  . dextrose 5 % and 0.9% NaCl Stopped (06/06/19 0513)  . DOBUTamine 5 mcg/kg/min (06/07/19 1100)  . heparin    . norepinephrine (LEVOPHED) Adult infusion 2 mcg/min (06/07/19 1100)  . prismasol BGK 4/2.5 1,000 mL/hr at 06/07/19 0848    PRN Medications: sodium chloride, alum & mag hydroxide-simeth, dextrose, diphenhydrAMINE, fentaNYL (SUBLIMAZE) injection, Gerhardt's butt cream, heparin, heparin, levalbuterol, ondansetron (ZOFRAN) IV, pneumococcal 23  valent vaccine, sodium chloride flush, traMADol     Assessment/Plan   1. Acute systolic HF ->  Cardiogenic Shock  - Due to iCM. EF 20-25% - Impella 5.5 placed on 4/2.   - s/p CABG/MVRepair on 4/6  - Impella out 4/9.  - Echo 4/10  with EF 25-30%, normal RV, no MR.  - Echo 4/22 EF 30-35% mildly reduced RV MVR ok   - Dobutamine added 5/14 for low co-ox 39% and inability to wean NE.  - Dobutamine increased to 5 on 5/21 due to low BP.Remains on NE at 3 to support BP. Co-ox 57%  - Repeat echo 05/29/19. EF 35-40% RV ok.  - Unable to wean off inotropes   2. 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 Echo stable - No ss/s ischemia  - On ASA/Crestor  3. Acute hypoxic respiratory failure - Due to pulmonary edema and PNA - Extubated 4/9 re-intubated on 4/12. Failed re-extubation on 4/14. Now s/p tracheostomy - S/P Bronch 4/27 - 60K yeast. -> ? Colonization.  - CT scan 5/7 with diffuse bilateral infiltrates c/w ARDS and large bilateral effusions R>L.  - s/p R chest tube - Remains on vent FiO2 40% - CCM following. On PS today. Failed trach collar trial. Likely need to get some fluid off to progress  4. Mitral Regurgitation - Mod-severe on ECHO - s/p MV repair, TEE 4/9 with minimal MR s/p repair.   - Echo 4/10 with no significant MR.  - echo 4/22 MVR stable - Repeat echo 05/29/19. EF 35-40% RV ok.  MVR ok  5. Uncontrolled DM - Hgb A1C 9.  - On insulin and sliding scale.  - No change  6. AKI - due to shock - Remains anuric post-op - Continue CVVHD per Nephrology.  - This is main issue currently. Unable to wean to iHD due to pressor dependence. It has now been about 4 weeks. We have tried many options to get him of pressors including blood transfusions, albumin, letting fluid re-accumulate, midodrine at 20 tid but remain unsuccessful. - He is now significantly volume overloaded. Will need to start pulling 50/hr today. I d/w Dr. Augustin Coupe - We are still struggling  to find a way out for him but as I explained to his family today - he will need to be able to tolerate iHD (off pressors) for [place] to be able to get him out of the ICU and to a rehab center or LTAC. Otherwise I do not see a way he can survive this. Will continue to discuss with Renal and TCTS what the timeline for continuing CVVHD is. Even if we do get him off NE, he is requiring dobutamine to support his cardiac output and when this was weaned recently he went back into shock. HD centers will not permit dobutamine infusions. - I will try Florinef carefull. It may help BP but need to watch for worsening fluid overload.  7. Acute blood loss Anemia  - Transfused 1UPRBCs 5/9/21and 5/14, 5/17 and 5/19  - hgb 7.8 today.  - transfuse hgb <= 7.5   8. Polymorphic VT - Recurrent VT during respiratory distress 4/14, - Off amio  - No further VT.  - Keep K> 4.0 Mg > 2.0  - Rhythm stable   9. Severe Malnutrition - Prealbumin improved, up from 8.9>>18.7 - Nutrition on board - Continue w/ TFs  10. Atrial fibrillation. paroxysmal - remains in NSR - Amiodarone was stopped.   11. Debility, severe - Continue PT/OT  12. Chronic pain issues - appears comfortable - having severe constipation. CCM managing  - On Oxy q4 PRN. Titrate down as tolerated as this has been causing severe constipation  He remains vent, HD and pressor dependent despite max efforts. I  increased dobutamine yesterday in an effort to wean NE but is back on NE this am.  I continue to be concerned that there may not be a clear path forward for him. I will start Florinef as above.   I have spoken to his wife (bedside) and daughter (phone) again today and I have asked his daughter to meet with [place] so we can continue to discuss in person. She has been following notes in his charts closely and has many questions about the specifics of his care. She says she can meet on Monday at 2pm.   I have offered his wife transfer to a tertiary center  for a second opinion if they desire.   CRITICAL CARE Performed by: Glori Bickers  Total critical care time: 45 minutes  Critical care time was exclusive of separately billable procedures and treating other patients.  Critical care was necessary to treat or prevent imminent or life-threatening deterioration.  Critical care was time spent personally by me (independent of midlevel providers or residents) on the following activities: development of treatment plan with patient and/or surrogate as well as nursing, discussions with consultants, evaluation of patient's response to treatment, examination of patient, obtaining history from patient or surrogate, ordering and performing treatments and interventions, ordering and review of laboratory studies, ordering and review of radiographic studies, pulse oximetry and re-evaluation of patient's condition.    Glori Bickers, MD  11:26 AM

## 2019-06-08 LAB — RENAL FUNCTION PANEL
Albumin: 2.2 g/dL — ABNORMAL LOW (ref 3.5–5.0)
Albumin: 2.3 g/dL — ABNORMAL LOW (ref 3.5–5.0)
Anion gap: 6 (ref 5–15)
Anion gap: 6 (ref 5–15)
BUN: 9 mg/dL (ref 8–23)
BUN: 10 mg/dL (ref 8–23)
CO2: 27 mmol/L (ref 22–32)
CO2: 27 mmol/L (ref 22–32)
Calcium: 7.9 mg/dL — ABNORMAL LOW (ref 8.9–10.3)
Calcium: 8.1 mg/dL — ABNORMAL LOW (ref 8.9–10.3)
Chloride: 100 mmol/L (ref 98–111)
Chloride: 99 mmol/L (ref 98–111)
Creatinine, Ser: 0.83 mg/dL (ref 0.61–1.24)
Creatinine, Ser: 0.96 mg/dL (ref 0.61–1.24)
GFR calc Af Amer: >60 mL/min (ref >60)
GFR calc Af Amer: >60 mL/min (ref >60)
GFR calc non Af Amer: >60 mL/min (ref >60)
GFR calc non Af Amer: >60 mL/min (ref >60)
Glucose, Bld: 303 mg/dL — ABNORMAL HIGH (ref 70–99)
Glucose, Bld: 360 mg/dL — ABNORMAL HIGH (ref 70–99)
Phosphorus: 1.4 mg/dL — ABNORMAL LOW (ref 2.5–4.6)
Phosphorus: 2 mg/dL — ABNORMAL LOW (ref 2.5–4.6)
Potassium: 4.5 mmol/L (ref 3.5–5.1)
Potassium: 4.8 mmol/L (ref 3.5–5.1)
Sodium: 133 mmol/L — ABNORMAL LOW (ref 135–145)
Sodium: 132 mmol/L — ABNORMAL LOW (ref 135–145)

## 2019-06-08 LAB — GLUCOSE, CAPILLARY
Glucose-Capillary: 134 mg/dL — ABNORMAL HIGH (ref 70–99)
Glucose-Capillary: 117 mg/dL — ABNORMAL HIGH (ref 70–99)
Glucose-Capillary: 126 mg/dL — ABNORMAL HIGH (ref 70–99)
Glucose-Capillary: 180 mg/dL — ABNORMAL HIGH (ref 70–99)
Glucose-Capillary: 155 mg/dL — ABNORMAL HIGH (ref 70–99)

## 2019-06-08 LAB — COOXEMETRY PANEL
Carboxyhemoglobin: 1.3 % (ref 0.5–1.5)
Methemoglobin: 0.8 % (ref 0.0–1.5)
O2 Saturation: 60.2 %
Total hemoglobin: 9.3 g/dL — ABNORMAL LOW (ref 12.0–16.0)

## 2019-06-08 LAB — CBC
HCT: 26.3 % — ABNORMAL LOW (ref 39.0–52.0)
Hemoglobin: 8.2 g/dL — ABNORMAL LOW (ref 13.0–17.0)
MCH: 30.1 pg (ref 26.0–34.0)
MCHC: 31.2 g/dL (ref 30.0–36.0)
MCV: 96.7 fL (ref 80.0–100.0)
Platelets: 79 10*3/uL — ABNORMAL LOW (ref 150–400)
RBC: 2.72 MIL/uL — ABNORMAL LOW (ref 4.22–5.81)
RDW: 16.9 % — ABNORMAL HIGH (ref 11.5–15.5)
WBC: 4.9 10*3/uL (ref 4.0–10.5)
nRBC: 0 % (ref 0.0–0.2)

## 2019-06-08 LAB — MAGNESIUM: Magnesium: 2.2 mg/dL (ref 1.7–2.4)

## 2019-06-08 MED ORDER — FLUDROCORTISONE ACETATE 0.1 MG PO TABS
0.1000 mg | ORAL_TABLET | Freq: Once | ORAL | Status: AC
  Administered 2019-06-08: 0.1 mg via ORAL
  Filled 2019-06-08: qty 1

## 2019-06-08 MED ORDER — FLUDROCORTISONE ACETATE 0.1 MG PO TABS
0.2000 mg | ORAL_TABLET | Freq: Every day | ORAL | Status: DC
  Administered 2019-06-09 – 2019-06-20 (×12): 0.2 mg via ORAL
  Filled 2019-06-08 (×13): qty 2

## 2019-06-08 NOTE — Plan of Care (Signed)
  Problem: Clinical Measurements: Goal: Respiratory complications will improve Outcome: Progressing   Problem: Nutrition: Goal: Adequate nutrition will be maintained Outcome: Progressing   Problem: Coping: Goal: Level of anxiety will decrease Outcome: Progressing   

## 2019-06-08 NOTE — Progress Notes (Signed)
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 Admission Scr was 0.99.  Assessment/ Plan:   1. Acute hypoxic respiratory failure on 05/23/19- on vent via trach. S/p right chest tube. Ventilation status unchanged. 2. Oliguric/anuricAKI- CRRT started on 4/10 and stopped 4/29 w/ attempted transition to IHD on 05/17/19 but failed and now back on CRRT 1. Remains anuric. 2. Resumed CRRT on 05/23/19 following acute worsening of his oxygenation and hypotension.  3. Dialysate 4K/2.5 Ca for replacement fluids and dialysate. no heparin.500/300/1000 pre/post/dialysate. 4. Keeping even(to 10-72m / hr +)for past week but he has gained significant water weight. He was as low as 52kg on 4/29 and when CRRT was resumed again on 5/7 his weight has increased 10kg since then. 5. Gave trial of low net UF (~50 ml/hr) over 24hrs and he's still + 237 ml with CVP of 4. 6. I do not see a good endpoint unfortunately; he will not come off renal replacement and unlikely to  tolerate iHD. Can't stay on CRRT indefinitely. Will stop CRRT today and then mon or tues  challenge with iHD;  if he doesn't tolerate then withdraw further RRT. Highly unlikely to tolerate iHD.  3. Vascular access-LIJ tunneledcatheter placed by IR on 05/19/19 (actually already tunneled)  4. Anemia of CKD, multifactorial-transfusions per primary team. Hemoglobin acceptable today. Darbo 1063m started 5/11. CTM and consider uptitrating as needed 5. CAD- 3 vessel with EF 25% s/p CABG and MVR 4/1/1/57omplicated by cardiogenic shock as below 6. Acute systolic CHF and cardiogenic shock- still on pressors. Volume incr bec of small fluid gains to support BP. 7. Elytes- Monitor phos daily. K acceptable today.  8. MR s/p  MVR 9. Severe debility- will likely need LTAC if survives.   Subjective:   Pressures still very tenuous and remains onDobutamine 5 and Levo 2.   Objective:   BP (!) 91/58   Pulse 70   Temp (!) 97.2 F (36.2 C) (Oral)   Resp 15   Ht 5' 11"  (1.803 m) Comment: measured x 3  Wt 66 kg   SpO2 91%   BMI 20.29 kg/m   Intake/Output Summary (Last 24 hours) at 06/08/2019 [id number] data filed at 06/08/2019 0900 Gross per 24 hour  Intake 2609.49 ml  Output 2586 ml  Net 23.49 ml   Weight change: -3.8 kg  Physical Exam: Gen: frail, thin, cachectic, alert Neck: lef IJTunneled cath(placed 5/3) CVS: normal rate Resp:Coarse bilateral breath sounds, ventilated/trached, rt sided CT Abd: +BS, soft, NT/ND ExBTD:HRCBULAGT+ lower extremity edema-atrophied legs  Imaging: DG Chest Port 1 View  Result Date: 06/07/2019 CLINICAL DATA:  Mitral valve repair EXAM: PORTABLE CHEST 1 VIEW COMPARISON:  06/04/2019 FINDINGS: Cardiac shadow is stable. Postsurgical changes are again seen. Tracheostomy tube, feeding catheter and left-sided dialysis catheter are again noted and stable. Right-sided PICC line and right thoracostomy tube are noted. No pneumothorax is seen. Stable opacities are noted throughout both lungs. No bony abnormality is noted. IMPRESSION: Stable appearance of the chest when compared with the prior exam. Electronically Signed   By: MaInez Catalina.D.   On: 06/07/2019 08:41    Labs: BMET Recent Labs  Lab 06/05/19 0452 06/05/19 1530 06/06/19 0427 06/06/19 1632 06/07/19 0319 06/07/19 1627 06/08/19 0254  NA 136 138 135 134* 133*  133* 133*  K 4.3 4.4 4.4 4.2 4.4 4.2 4.5  CL 104 106 103 102 101 101 100  CO2 28 26 29 27 27 28 27   GLUCOSE 140* 139* 106* 237* 306* 301* 303*  BUN 13 11 10 8 9 9 9   CREATININE 1.00 0.98 1.04 1.03 0.99 0.92 0.83  CALCIUM 7.8* 8.0* 8.0* 8.2* 8.0* 8.0* 7.9*  PHOS 1.7* 1.7* 1.8* 2.0* 2.1* 1.8* 1.4*   CBC Recent Labs  Lab 06/05/19 0452  06/06/19 0427 06/07/19 0319 06/08/19 0254  WBC 3.8* 6.7 3.6* 4.9  HGB 8.3* 8.7* 7.8* 8.2*  HCT 26.7* 27.8* 25.5* 26.3*  MCV 96.7 96.9 97.3 96.7  PLT 75* 98* 71* 79*    Medications:    . aspirin EC  325 mg Oral Daily   Or  . aspirin  324 mg Per Tube Daily  . B-complex with vitamin C  1 tablet Per Tube Daily  . chlorhexidine gluconate (MEDLINE KIT)  15 mL Mouth Rinse BID  . Chlorhexidine Gluconate Cloth  6 each Topical Q0600  . citalopram  10 mg Per Tube Daily  . collagenase   Topical Daily  . darbepoetin (ARANESP) injection - NON-DIALYSIS  100 mcg Subcutaneous Q Tue-1800  . docusate  200 mg Per Tube Daily  . feeding supplement (PRO-STAT SUGAR FREE 64)  30 mL Per Tube BID  . feeding supplement (VITAL 1.5 CAL)  1,000 mL Per Tube Q24H  . fentaNYL  1 patch Transdermal Q72H  . fludrocortisone  0.1 mg Oral Daily  . gabapentin  100 mg Oral QHS  . insulin aspart  0-6 Units Subcutaneous Q4H  . mouth rinse  15 mL Mouth Rinse 10 times per day  . melatonin  3 mg Per Tube QHS  . metoCLOPramide (REGLAN) injection  10 mg Intravenous Q6H  . midodrine  20 mg Per Tube Q8H  . pantoprazole sodium  40 mg Per Tube BID  . polyethylene glycol  17 g Oral Daily  . QUEtiapine  25 mg Oral QHS  . rOPINIRole  0.5 mg Oral QHS  . sennosides  5 mL Per Tube BID  . sodium chloride flush  10-40 mL Intracatheter Q12H  . sorbitol  30 mL Per Tube q morning - 10a      Otelia Santee, MD 06/08/2019, 9:16 AM

## 2019-06-08 NOTE — Progress Notes (Signed)
Patient ID: Brendan [name], male   DOB: 24-May-1955, 64 y.o.   MRN: [id number]     Advanced Heart Failure Rounding Note  PCP-Cardiologist: No primary care provider on file.   Subjective:    Remains on vent through trach - now on PS  On CVVHD turned to -50 yesterday  Due to progressive volume overload. Weight down 8 pounds per bed weight.   Remains on NE at 2. Dobutamine at 5. On midodrine 20 tid. Florinef added 5/23. SBP still 80-90s  CVP 7 (checked personally)  Co-ox 60%  Objective:   Weight Range: 66 kg Body mass index is 20.29 kg/m.   Vital Signs:   Temp:  [97.2 F (36.2 C)] 97.2 F (36.2 C) (05/23 0315) Pulse Rate:  [63-73] 70 (05/23 1000) Resp:  [10-29] 18 (05/23 1000) BP: (74-106)/(38-82) 95/51 (05/23 1000) SpO2:  [83 %-100 %] 90 % (05/23 1000) FiO2 (%):  [40 %] 40 % (05/23 0723) Weight:  [66 kg] 66 kg (05/23 0500) Last BM Date: 06/06/19  Weight change: Filed Weights   06/06/19 0500 06/07/19 0500 06/08/19 0500  Weight: 66.7 kg 69.8 kg 66 kg    Intake/Output:   Intake/Output Summary (Last 24 hours) at 06/08/2019 1033 Last data filed at 06/08/2019 1000 Gross per 24 hour  Intake 2551.99 ml  Output 2789 ml  Net -237.01 ml      Physical Exam   General:  Weak/fatigue appearing WM, sitting up in bed. Cachetic HEENT: normal +cor-trak Neck: supple. JVP 7-8 . + trach  Carotids 2+ bilat; no bruits. No lymphadenopathy or thryomegaly appreciated. Cor: PMI nondisplaced. Regular rate & rhythm. No rubs, gallops or murmurs. Lungs: coarse  R CT Abdomen: soft, nontender, nondistended. No hepatosplenomegaly. No bruits or masses. Good bowel sounds. Extremities: no cyanosis, clubbing, rash, 1+ edema Neuro: alert & orientedx3, cranial nerves grossly intact. moves all 4 extremities w/o difficulty. Affect pleasant    Telemetry   NSR 60-70s Personally reviewed   Labs    CBC Recent Labs    06/07/19 0319 06/08/19 0254  WBC 3.6* 4.9  HGB 7.8* 8.2*  HCT 25.5*  26.3*  MCV 97.3 96.7  PLT 71* 79*   Basic Metabolic Panel Recent Labs    06/07/19 0319 06/07/19 0319 06/07/19 1627 06/08/19 0254  NA 133*   < > 133* 133*  K 4.4   < > 4.2 4.5  CL 101   < > 101 100  CO2 27   < > 28 27  GLUCOSE 306*   < > 301* 303*  BUN 9   < > 9 9  CREATININE 0.99   < > 0.92 0.83  CALCIUM 8.0*   < > 8.0* 7.9*  MG 2.3  --   --  2.2  PHOS 2.1*   < > 1.8* 1.4*   < > = values in this interval not displayed.   Liver Function Tests Recent Labs    06/06/19 1001 06/06/19 1632 06/07/19 1627 06/08/19 0254  AST 24  --   --   --   ALT 14  --   --   --   ALKPHOS 393*  --   --   --   BILITOT 1.0  --   --   --   PROT 5.6*  --   --   --   ALBUMIN 2.0*   < > 2.3* 2.2*   < > = values in this interval not displayed.   Recent Labs    06/06/19 1001  AMYLASE 26*   Cardiac Enzymes No results for input(s): CKTOTAL, CKMB, CKMBINDEX, TROPONINI in the last 72 hours.  BNP: BNP (last 3 results) Recent Labs    04/04/2019 0113  BNP 655.7*    ProBNP (last 3 results) No results for input(s): PROBNP in the last 8760 hours.   D-Dimer No results for input(s): DDIMER in the last 72 hours. Hemoglobin A1C No results for input(s): HGBA1C in the last 72 hours. Fasting Lipid Panel No results for input(s): CHOL, HDL, LDLCALC, TRIG, CHOLHDL, LDLDIRECT in the last 72 hours. Thyroid Function Tests No results for input(s): TSH, T4TOTAL, T3FREE, THYROIDAB in the last 72 hours.  Invalid input(s): FREET3  Other results:   Imaging    No results found.   Medications:     Scheduled Medications: . aspirin EC  325 mg Oral Daily   Or  . aspirin  324 mg Per Tube Daily  . B-complex with vitamin C  1 tablet Per Tube Daily  . chlorhexidine gluconate (MEDLINE KIT)  15 mL Mouth Rinse BID  . Chlorhexidine Gluconate Cloth  6 each Topical Q0600  . citalopram  10 mg Per Tube Daily  . collagenase   Topical Daily  . darbepoetin (ARANESP) injection - NON-DIALYSIS  100 mcg  Subcutaneous Q Tue-1800  . docusate  200 mg Per Tube Daily  . feeding supplement (PRO-STAT SUGAR FREE 64)  30 mL Per Tube BID  . feeding supplement (VITAL 1.5 CAL)  1,000 mL Per Tube Q24H  . fentaNYL  1 patch Transdermal Q72H  . fludrocortisone  0.1 mg Oral Daily  . gabapentin  100 mg Oral QHS  . insulin aspart  0-6 Units Subcutaneous Q4H  . mouth rinse  15 mL Mouth Rinse 10 times per day  . melatonin  3 mg Per Tube QHS  . metoCLOPramide (REGLAN) injection  10 mg Intravenous Q6H  . midodrine  20 mg Per Tube Q8H  . pantoprazole sodium  40 mg Per Tube BID  . polyethylene glycol  17 g Oral Daily  . QUEtiapine  25 mg Oral QHS  . rOPINIRole  0.5 mg Oral QHS  . sennosides  5 mL Per Tube BID  . sodium chloride flush  10-40 mL Intracatheter Q12H  . sorbitol  30 mL Per Tube q morning - 10a    Infusions: .  prismasol BGK 4/2.5 500 mL/hr at 06/08/19 0756  .  prismasol BGK 4/2.5 300 mL/hr at 06/08/19 0404  . sodium chloride 10 mL/hr at 06/02/19 1500  . sodium chloride Stopped (05/31/19 0958)  . dextrose 30 mL/hr at 06/08/19 1001  . dextrose 5 % and 0.9% NaCl Stopped (06/06/19 0513)  . DOBUTamine 5 mcg/kg/min (06/08/19 1000)  . heparin    . norepinephrine (LEVOPHED) Adult infusion 3 mcg/min (06/08/19 1000)  . prismasol BGK 4/2.5 1,000 mL/hr at 06/08/19 0603    PRN Medications: sodium chloride, alum & mag hydroxide-simeth, dextrose, diphenhydrAMINE, fentaNYL (SUBLIMAZE) injection, Gerhardt's butt cream, heparin, heparin, levalbuterol, ondansetron (ZOFRAN) IV, pneumococcal 23 valent vaccine, sodium chloride flush, traMADol     Assessment/Plan   1. Acute systolic HF ->  Cardiogenic Shock  - Due to iCM. EF 20-25% - Impella 5.5 placed on 4/2.   - s/p CABG/MVRepair on 4/6  - Impella out 4/9.  - Echo 4/10 with EF 25-30%, normal RV, no MR.  - Echo 4/22 EF 30-35% mildly reduced RV MVR ok   - Dobutamine added 5/14 for low co-ox 39% and inability to wean NE.  -  Dobutamine increased to 5 on  5/21 due to low BP.Remains on NE at 2, midodrine at 20 tid and florinef to support BP. Co-ox 60%  - Repeat echo 05/29/19. EF 35-40% RV ok.  - Still struggling towean off inotropes   2. 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 Echo stable - No s/s ischemia - On ASA/Crestor  3. Acute hypoxic respiratory failure - Due to pulmonary edema and PNA - Extubated 4/9 re-intubated on 4/12. Failed re-extubation on 4/14. Now s/p tracheostomy - S/P Bronch 4/27 - 60K yeast. -> ? Colonization.  - CT scan 5/7 with diffuse bilateral infiltrates c/w ARDS and large bilateral effusions R>L.  - s/p R chest tube. No significant drainage - Remains on vent FiO2 40% - CCM following. On PS support this am. Failed trach collar trial. Likely need to get some fluid off to progress  4. Mitral Regurgitation - Mod-severe on ECHO - s/p MV repair, TEE 4/9 with minimal MR s/p repair.   - Echo 4/10 with no significant MR.  - echo 4/22 MVR stable - Repeat echo 05/29/19. EF 35-40% RV ok.  MVR ok  5. Uncontrolled DM - Hgb A1C 9.  - On insulin and sliding scale.  - No change  6. AKI - due to shock - Remains anuric post-op - Continue CVVHD per Nephrology. Plan to stop tomorrow and give him a break to see if kidneys will respond.  - This remains the main issue currently. Unable to wean to iHD due to pressor dependence. It has now been > 4 weeks. We have tried many options to get him of pressors including blood transfusions, albumin, letting fluid re-accumulate, midodrine at 20 tid but remain unsuccessful. - He is now volume overloaded. CVVHD started pulling -52 yesterday. Bed weight down 8 pounds.  - We are still struggling to find a way out for him - he will need to be able to tolerate iHD (off pressors) for [place] to be able to get him out of the ICU and to a rehab center or LTAC. Otherwise I do not see a way he can survive this. Will continue to discuss with Renal and TCTS what the timeline for  continuing CVVHD is. Even if we do get him off NE, he is requiring dobutamine to support his cardiac output and when this was weaned recently he went back into shock. HD centers will not permit dobutamine infusions. - Yesterday I added Florinef 0.1. It may help BP but need to watch for worsening fluid overload. Seems to be tolerating. Will increase to 0.2  7. Acute blood loss Anemia  - Transfused 1UPRBCs 5/9/21and 5/14, 5/17 and 5/19  - hgb 8.2 today.  - transfuse hgb <= 7.5   8. Polymorphic VT - Recurrent VT during respiratory distress 4/14, - Off amio  - No further VT.  - Keep K> 4.0 Mg > 2.0  - Rhythm stable   9. Severe Malnutrition - Prealbumin improved, up from 8.9>>18.7 - Nutrition on board - Continue w/ TFs  10. Atrial fibrillation. paroxysmal - remains in NSR today. No change - Amiodarone was stopped.   11. Debility, severe - Continue PT/OT  12. Chronic pain issues - appears comfortable - having severe constipation. CCM managing  - On Oxy q4 PRN. Titrate down as tolerated as this has been causing severe constipation   I spoke to his wife (bedside) and daughter (phone) yesterday and I have asked his daughter  to meet with [place] so we can continue to discuss in person. She has been following notes in his charts closely and has many questions about the specifics of his care. She says she can meet on tomorrow at 2pm.   I have offered his wife transfer to a tertiary center for a second opinion if they desire.   CRITICAL CARE Performed by: Glori Bickers  Total critical care time: 35 minutes  Critical care time was exclusive of separately billable procedures and treating other patients.  Critical care was necessary to treat or prevent imminent or life-threatening deterioration.  Critical care was time spent personally by me (independent of midlevel providers or residents) on the following activities: development of treatment plan with patient and/or surrogate as well  as nursing, discussions with consultants, evaluation of patient's response to treatment, examination of patient, obtaining history from patient or surrogate, ordering and performing treatments and interventions, ordering and review of laboratory studies, ordering and review of radiographic studies, pulse oximetry and re-evaluation of patient's condition.    Glori Bickers, MD  10:33 AM

## 2019-06-08 NOTE — Progress Notes (Signed)
37 Days Post-Op Procedure(s) (LRB): VIDEO BRONCHOSCOPY USING DISPOSABLE ANESTHESIA SCOPE (N/A) TRACHEOSTOMY (N/A) Subjective: Shortness of breath  Objective: Vital signs in last 24 hours: Temp:  [97.2 F (36.2 C)] 97.2 F (36.2 C) (05/23 0315) Pulse Rate:  [63-73] 70 (05/23 0900) Cardiac Rhythm: Normal sinus rhythm (05/23 0800) Resp:  [10-29] 15 (05/23 0900) BP: (68-106)/(38-82) 91/58 (05/23 0900) SpO2:  [83 %-100 %] 91 % (05/23 0900) FiO2 (%):  [40 %] 40 % (05/23 0723) Weight:  [66 kg] 66 kg (05/23 0500)  Hemodynamic parameters for last 24 hours: CVP:  [4 mmHg-30 mmHg] 4 mmHg  Intake/Output from previous day: 05/22 0701 - 05/23 0700 In: 2610 [I.V.:870; NG/GT:1740] Out: 2373 [Stool:600] Intake/Output this shift: Total I/O In: 192.2 [I.V.:72.2; NG/GT:120] Out: 153 [Other:153]  General appearance: alert, cooperative and mild distress Neurologic: intact Heart: regular rate and rhythm, S1, S2 normal, no murmur, click, rub or gallop Lungs: rales bilaterally Extremities: extremities normal, atraumatic, no cyanosis or edema Wound: c/d/i  Lab Results: Recent Labs    06/07/19 0319 06/08/19 0254  WBC 3.6* 4.9  HGB 7.8* 8.2*  HCT 25.5* 26.3*  PLT 71* 79*   BMET:  Recent Labs    06/07/19 1627 06/08/19 0254  NA 133* 133*  K 4.2 4.5  CL 101 100  CO2 28 27  GLUCOSE 301* 303*  BUN 9 9  CREATININE 0.92 0.83  CALCIUM 8.0* 7.9*    PT/INR: No results for input(s): LABPROT, INR in the last 72 hours. ABG    Component Value Date/Time   PHART 7.426 06/02/2019 0443   HCO3 26.3 06/02/2019 0443   TCO2 27 06/02/2019 0443   ACIDBASEDEF 3.0 (H) 04/28/2019 0735   O2SAT 60.2 06/08/2019 0300   CBG (last 3)  Recent Labs    06/07/19 2318 06/08/19 0331 06/08/19 0746  GLUCAP 128* 134* 117*    Assessment/Plan: S/P Procedure(s) (LRB): VIDEO BRONCHOSCOPY USING DISPOSABLE ANESTHESIA SCOPE (N/A) TRACHEOSTOMY (N/A) continue present management  Consider right chest tube  removal, but cautious due to mechanical ventilation requirement.    LOS: 55 days    Brendan [name] 06/08/2019

## 2019-06-09 ENCOUNTER — Inpatient Hospital Stay (HOSPITAL_COMMUNITY): Payer: Medicaid Other

## 2019-06-09 LAB — CBC
HCT: 26 % — ABNORMAL LOW (ref 39.0–52.0)
Hemoglobin: 8 g/dL — ABNORMAL LOW (ref 13.0–17.0)
MCH: 29.6 pg (ref 26.0–34.0)
MCHC: 30.8 g/dL (ref 30.0–36.0)
MCV: 96.3 fL (ref 80.0–100.0)
Platelets: 88 10*3/uL — ABNORMAL LOW (ref 150–400)
RBC: 2.7 MIL/uL — ABNORMAL LOW (ref 4.22–5.81)
RDW: 16.5 % — ABNORMAL HIGH (ref 11.5–15.5)
WBC: 4.9 10*3/uL (ref 4.0–10.5)
nRBC: 0 % (ref 0.0–0.2)

## 2019-06-09 LAB — RENAL FUNCTION PANEL
Albumin: 2.2 g/dL — ABNORMAL LOW (ref 3.5–5.0)
Albumin: 2.2 g/dL — ABNORMAL LOW (ref 3.5–5.0)
Anion gap: 5 (ref 5–15)
Anion gap: 6 (ref 5–15)
BUN: 16 mg/dL (ref 8–23)
BUN: 25 mg/dL — ABNORMAL HIGH (ref 8–23)
CO2: 26 mmol/L (ref 22–32)
CO2: 26 mmol/L (ref 22–32)
Calcium: 8.4 mg/dL — ABNORMAL LOW (ref 8.9–10.3)
Calcium: 8.3 mg/dL — ABNORMAL LOW (ref 8.9–10.3)
Chloride: 100 mmol/L (ref 98–111)
Chloride: 99 mmol/L (ref 98–111)
Creatinine, Ser: 1.36 mg/dL — ABNORMAL HIGH (ref 0.61–1.24)
Creatinine, Ser: 1.73 mg/dL — ABNORMAL HIGH (ref 0.61–1.24)
GFR calc Af Amer: >60 mL/min (ref >60)
GFR calc Af Amer: 48 mL/min — ABNORMAL LOW (ref >60)
GFR calc non Af Amer: 56 mL/min — ABNORMAL LOW (ref >60)
GFR calc non Af Amer: 42 mL/min — ABNORMAL LOW (ref >60)
Glucose, Bld: 298 mg/dL — ABNORMAL HIGH (ref 70–99)
Glucose, Bld: 184 mg/dL — ABNORMAL HIGH (ref 70–99)
Phosphorus: 2.1 mg/dL — ABNORMAL LOW (ref 2.5–4.6)
Phosphorus: 2.7 mg/dL (ref 2.5–4.6)
Potassium: 5.3 mmol/L — ABNORMAL HIGH (ref 3.5–5.1)
Potassium: 5.4 mmol/L — ABNORMAL HIGH (ref 3.5–5.1)
Sodium: 131 mmol/L — ABNORMAL LOW (ref 135–145)
Sodium: 131 mmol/L — ABNORMAL LOW (ref 135–145)

## 2019-06-09 LAB — BASIC METABOLIC PANEL
Anion gap: 7 (ref 5–15)
BUN: 19 mg/dL (ref 8–23)
CO2: 25 mmol/L (ref 22–32)
Calcium: 8.5 mg/dL — ABNORMAL LOW (ref 8.9–10.3)
Chloride: 98 mmol/L (ref 98–111)
Creatinine, Ser: 1.55 mg/dL — ABNORMAL HIGH (ref 0.61–1.24)
GFR calc Af Amer: 55 mL/min — ABNORMAL LOW (ref >60)
GFR calc non Af Amer: 48 mL/min — ABNORMAL LOW (ref >60)
Glucose, Bld: 94 mg/dL (ref 70–99)
Potassium: 5.2 mmol/L — ABNORMAL HIGH (ref 3.5–5.1)
Sodium: 130 mmol/L — ABNORMAL LOW (ref 135–145)

## 2019-06-09 LAB — GLUCOSE, CAPILLARY
Glucose-Capillary: 103 mg/dL — ABNORMAL HIGH (ref 70–99)
Glucose-Capillary: 174 mg/dL — ABNORMAL HIGH (ref 70–99)
Glucose-Capillary: 187 mg/dL — ABNORMAL HIGH (ref 70–99)
Glucose-Capillary: 193 mg/dL — ABNORMAL HIGH (ref 70–99)
Glucose-Capillary: 82 mg/dL (ref 70–99)
Glucose-Capillary: 98 mg/dL (ref 70–99)

## 2019-06-09 LAB — COOXEMETRY PANEL
Carboxyhemoglobin: 1 % (ref 0.5–1.5)
Methemoglobin: 0.8 % (ref 0.0–1.5)
O2 Saturation: 47.9 %
Total hemoglobin: 10.7 g/dL — ABNORMAL LOW (ref 12.0–16.0)

## 2019-06-09 LAB — MAGNESIUM: Magnesium: 2.3 mg/dL (ref 1.7–2.4)

## 2019-06-09 MED ORDER — SODIUM ZIRCONIUM CYCLOSILICATE 10 G PO PACK
10.0000 g | PACK | Freq: Once | ORAL | Status: AC
  Administered 2019-06-09: 10 g via ORAL
  Filled 2019-06-09: qty 1

## 2019-06-09 MED ORDER — CHLORHEXIDINE GLUCONATE CLOTH 2 % EX PADS
6.0000 | MEDICATED_PAD | Freq: Every day | CUTANEOUS | Status: DC
  Administered 2019-06-10: 6 via TOPICAL

## 2019-06-09 NOTE — Progress Notes (Signed)
[facility] Progress Note   [place] with [facility], new low EF and NSTEMI txd to [facility] 3/29: 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 Admission Scr was 0.99.  Assessment/ Plan:   1. Acute hypoxic respiratory failure on 05/23/19- on vent via trach. S/p right chest tube. Oliguric/anuricAKI- CRRT started on 4/10 and stopped 4/29 w/ attempted transition to IHD on 05/17/19 but failed and required resumption of CRRT 1. Remains anuric. 2. Resumed CRRT on 05/23/19 following acute worsening of his oxygenation and hypotension.  3. Paused CRRT yesterday with plans for trial of iHD to assess if he will tolerate. No indications for RRT today so will plan for iHD tomorrow.  4. Plan to trial iHD tomorrow with concern given hypotension he may not tolerate it given hypotension.  If he is CRRT dependent he cannot leave the hospital.    3. Vascular access-LIJ tunneledcatheter placed by IR on 05/19/19 4. Anemia of CKD, multifactorial-transfusions per primary team. Hemoglobin 8 today. Darbo 126mg started 5/11. CTM and uptitrate as needed 5. CAD- 3 vessel with EF 25% s/p CABG and MVR 41/3/08complicated by cardiogenic shock as below 6. Acute systolic CHF and cardiogenic shock-  Dobutamine, NE, florinef, midodrine.  7. Electrolytes- K 5.3 this AM, lokelma 5g now.  Replete phos.  8. MR s/p MVR 9. Severe debility- will need LTACH if can tolerate iHD  Subjective:   Pressures still tenuous and remains onDobutamine 5 and Levo 2.  Having transient hypotension 70/40s this AM.  CRRT off yesterday with plans for trial of HD. Remains anuric.  Up to chair this morning.    Objective:   BP (!) 77/52   Pulse 70   Temp 97.7 F (36.5 C) (Oral)   Resp (!) 27   Ht 5' 11"  (1.803 m) Comment: measured x 3  Wt 65.7 kg   SpO2 98%   BMI 20.20 kg/m   Intake/Output Summary (Last 24  hours) at 06/09/2019 [id number] data filed at 06/09/2019 0600 Gross per 24 hour  Intake 2268.65 ml  Output 1955 ml  Net 313.65 ml   Weight change: -0.3 kg  Physical Exam: Gen: frail, thin, cachectic, alert in bedside chair Neck: lef IJTunneled cath(placed 5/3) CVS: normal rate Resp:Coarse bilateral breath sounds, ventilated/trached, rt sided CT Abd: +BS, soft, NT/ND EION:[id number]+ lower extremity edema-atrophied legs  Imaging: DG Chest 1 View  Result Date: 06/09/2019 CLINICAL DATA:  CHF EXAM: CHEST  1 VIEW COMPARISON:  06/07/2019 FINDINGS: Changes of CABG and valve replacement. Right chest tube remains in place. No pneumothorax. Remainder support devices are stable. Severe diffuse bilateral airspace disease is slightly worsened since prior study. No visible effusions. IMPRESSION: Worsening severe diffuse bilateral airspace disease. Electronically Signed   By: KRolm BaptiseM.D.   On: 06/09/2019 07:22    Labs: BMET Recent Labs  Lab 06/06/19 0427 06/06/19 1632 06/07/19 0319 06/07/19 1627 06/08/19 0254 06/08/19 1643 06/09/19 0301  NA 135 134* 133* 133* 133* 132* 131*  K 4.4 4.2 4.4 4.2 4.5 4.8 5.3*  CL 103 102 101 101 100 99 100  CO2 29 27 27 28 27 27 26   GLUCOSE 106* 237* 306* 301* 303* 360* 298*  BUN 10 8 9 9 9 10 16   CREATININE 1.04 1.03 0.99 0.92 0.83 0.96 1.36*  CALCIUM 8.0* 8.2* 8.0* 8.0* 7.9* 8.1* 8.4*  PHOS 1.8* 2.0* 2.1* 1.8* 1.4* 2.0* 2.1*  CBC Recent Labs  Lab 06/06/19 0427 06/07/19 0319 06/08/19 0254 06/09/19 0301  WBC 6.7 3.6* 4.9 4.9  HGB 8.7* 7.8* 8.2* 8.0*  HCT 27.8* 25.5* 26.3* 26.0*  MCV 96.9 97.3 96.7 96.3  PLT 98* 71* 79* 88*    Medications:    . aspirin EC  325 mg Oral Daily   Or  . aspirin  324 mg Per Tube Daily  . B-complex with vitamin C  1 tablet Per Tube Daily  . chlorhexidine gluconate (MEDLINE KIT)  15 mL Mouth Rinse BID  . Chlorhexidine Gluconate Cloth  6 each Topical Q0600  . citalopram  10 mg Per Tube Daily  .  collagenase   Topical Daily  . darbepoetin (ARANESP) injection - NON-DIALYSIS  100 mcg Subcutaneous Q Tue-1800  . docusate  200 mg Per Tube Daily  . feeding supplement (PRO-STAT SUGAR FREE 64)  30 mL Per Tube BID  . feeding supplement (VITAL 1.5 CAL)  1,000 mL Per Tube Q24H  . fentaNYL  1 patch Transdermal Q72H  . fludrocortisone  0.2 mg Oral Daily  . gabapentin  100 mg Oral QHS  . insulin aspart  0-6 Units Subcutaneous Q4H  . mouth rinse  15 mL Mouth Rinse 10 times per day  . melatonin  3 mg Per Tube QHS  . metoCLOPramide (REGLAN) injection  10 mg Intravenous Q6H  . midodrine  20 mg Per Tube Q8H  . pantoprazole sodium  40 mg Per Tube BID  . polyethylene glycol  17 g Oral Daily  . QUEtiapine  25 mg Oral QHS  . rOPINIRole  0.5 mg Oral QHS  . sennosides  5 mL Per Tube BID  . sodium chloride flush  10-40 mL Intracatheter Q12H    Jannifer Hick MD [facility] Pager [phone]

## 2019-06-09 NOTE — Progress Notes (Signed)
NAME:  Brendan [name], MRN:  [id number], DOB:  07-25-1955, LOS: 18 ADMISSION DATE:  04/08/2019, CONSULTATION DATE:  04/17/19 REFERRING MD:  Dr. Haroldine Laws, CHIEF COMPLAINT:  Respiratory distress   Brief History   61 yoM originally presented with SOB and fatigue at [facility] found to have new HFrEF, NSTEMI, and bilateral pleural effusions transferred to [facility] on 3/29 for further cardiac evaluation. 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 Started on lasix and milrinone gtts however developed NSVT. Was taken 4/1 for placement of IABP and swan for optimization prior to CABG. Was on precedex for agitation/ confusion, some concern for DTs. On the evening of 4/1, patient developed worsening respiratory distress and hypoxia, PCCM consulted for intubation and vent management.  Patient was extubated on 4/14 and developed worsening hypoxia on BiPAP. Overnight he developed pulseless VT requiring 1 minute of CPR and epinephrine to achieve ROSC. Neurovascularly intact afterward. Subsequently had increased work of breathing and worsening respiratory distress requiring intubation. Patient had tracheostomy performed on 04/26/2019. Trying to wean off ventilator.   Past Medical History  Tobacco abuse, HTN, poorly controlled diabetes, diabetic neuropathy  Significant Hospital Events   3/30 Lt heart cath/TEE performed 4/1 Intubated, RHC/IABP/PA cath 4/2 Impella placement 4/3 febrile over evening and night. Blood cultures obtained. Vanc/cefepime started. Hematuria--heparin held. 4/4 Extubated,abx transitioned to rocephin. Heparin restarted 4/6 Re-intubated for CABG/MVR 4/7 Extubated from CABG to BiPAP 4/9 Impella removed  4/9 TEE 4/10 CVC inserted &CRRT initiated  4/12 PCCMre-consulted re-intubation 4/14 Extubated to BiPAP 4/15 Re-intubated due to respiratory distress 4/16 Tracheostomy 4/27 bronch with BAL 5/7 called back for hypercarbic failure. #6 cuffed placed. Right chest tube placed for enlarging  effusion. 5/15-18 remains on vent, CVVHD  Consults:  TCTS PCCM HF Nephrology  Procedures:  R cephalic double lumen PICC LIJ tunneled HD catheter L brachial arterial line 6-0 cuffed shiley  Significant Diagnostic Tests:  3/30 R/ LHC >>  Ost LM to Mid LM lesion is 35% stenosed.  Prox LAD lesion is 90% stenosed.  Prox LAD to Mid LAD lesion is 50% stenosed.  Mid Cx lesion is 100% stenosed.  Prox RCA to Mid RCA lesion is 90% stenosed.  RPDA lesion is 95% stenosed.  LV end diastolic pressure is severely elevated.  Hemodynamic findings consistent with moderate pulmonary hypertension. 1. Severe 3 vessel obstructive CAD 2. High LV filling pressures 3. Reduced cardiac output with index 2.3 4. Moderate pulmonary HTN.  3/30TTE >> 1. Left ventricular ejection fraction, by estimation, is 30 to 35%. The left ventricle has moderately decreased function. The left ventricle demonstrates regional wall motion abnormalities.There is mild left ventricular hypertrophy. Left ventricular diastolic function could not be evaluated. There is severe hypokinesis of the left ventricular, entire inferior wall, inferoseptal wall, apical segment and lateral wall.  2. Right ventricular systolic function is hyperdynamic. The right ventricular size is normal. There is moderately elevated pulmonary artery systolic pressure.  3. Decreased posterior leaflet motion due to ischemic tethering of the mitral valve leaflets.  4. The mitral valve is abnormal. Moderate to severe mitral valve regurgitation.  5. The aortic valve is tricuspid. Aortic valve regurgitation is not visualized. Mild aortic valve sclerosis is present, with no evidence of aortic valve stenosis.  6. The inferior vena cava is normal in size with greater than 50% respiratory variability, suggesting right atrial pressure of 3 mmHg.   4/1 CT chest w/o >>Large bilateral pleural effusions with associated atelectasis.Moderate to severe  bilateral ground-glass opacities, likely reflecting pneumonia and/or edema.  4/1 RHC >> RA = 18 RV = 60/20  PA = 63/29 (42) PCW = 33 Fick cardiac output/index = 3.7/1.9 PVR = 2.5 WU FA sat = 98% PA sat = 47%, 49% PaPi = 2.2   Micro Data:  5/7 R pleural fluid neg 5/7 blood neg to date 5/6 tracheal aspirate enterobacter  Antimicrobials:    Interim history/subjective:  No events. CRRT stopped yesterday in anticipation of iHD trial. Remains on pressors.  Objective   Blood pressure (!) 77/52, pulse 70, temperature 97.7 F (36.5 C), temperature source Oral, resp. rate (!) 27, height _0  (1.803 m), weight 65.7 kg, SpO2 98 %. CVP:  [3 mmHg-39 mmHg] 7 mmHg  Vent Mode: PRVC FiO2 (%):  [40 %] 40 % Set Rate:  [25 bmp] 25 bmp Vt Set:  [600 mL] 600 mL PEEP:  [5 cmH20] 5 cmH20 Pressure Support:  [14 cmH20] 14 cmH20 Plateau Pressure:  [28 cmH20-34 cmH20] 32 cmH20   Intake/Output Summary (Last 24 hours) at 06/09/2019 0735 Last data filed at 06/09/2019 0600 Gross per 24 hour  Intake 2268.65 ml  Output 1955 ml  Net 313.65 ml   Filed Weights   06/07/19 0500 06/08/19 0500 06/09/19 0500  Weight: 69.8 kg 66 kg 65.7 kg    Examination:   GEN: Cachectic elderly debilitated gentleman, on mechanical ventilator HEENT: Tracheostomy tube in place, clean dry and intact CV: Regular rate rhythm, S1-S2 PULM: Coarse bilateral ventilated breath sounds GI: Soft nontender nondistended, +BS EXT: No significant edema, + muscle wasting NEURO: awake and following commands PSYCH: RASS 0 SKIN: Multiple areas of skin breakdown   K a little up Pts improved  Resolved Problems   Agitated delirium- improved - seroquel qHS, continue melatonin - continue PRN oxycodone - re-orient, encourage day/night cycles  Assessment & Plan:   Acute on chronic hypoxemic and hypercarbic respiratory failure after CABG now s/p trach. Multifactorial etiology. Treated Enterobacter pneumonia s/p antibiotics.  Persistent diffuse interstitial and airspace process on CXR 5/18 in setting of CHF and severe protein calorie malnutrition manifested by resultant pulmonary edema and pleural effusions. Plan: -TC as able -Chest tube management per primary  Shock- likely multifactorial etiology in setting of acute systolic CHF 2/2 iCM (most recent 5/13 EF 35-40%) s/p CABG/MVR 4/6 and cardiogenic shock. Most likely also has distributive component in setting of protein calorie malnutrition resulting in low oncotic pressure -> significant third spacing.  -Continue dobutamine, florinef, levophed, midodrine  ESRD: oliguric/anuric AKI in setting of multifactorial shock as described above. CRRT started on 4/10 and stopped 4/29 with failed attempt to transition to iHD on 5/1. Now back on CRRT on 5/7 2/2 acute worsening of oxygenation and hypotension. He currently remains anuric. -iHD trial at some point  Constipation/colonic ileus - Resolved, change sorbitol to PRN - Continue senna and miralax  Dysphagia- -Likely need PEG tube at some point  Hypoglycemia, resolved -Continue tube feeds, continue to monitor  Profound muscular deconditioning -PT OT  Pancytopenia- stable, monitor  Goals of care-continue current level of care  Best practice:  Diet: Tube feeds Pain/Anxiety/Delirium protocol (if indicated): see above VAP protocol (if indicated):HOB 30 degrees  DVT prophylaxis: ASA 336m, SCDs GI prophylaxis: PPI Glucose control:SSI Mobility: BR Code Status: Full  Family Communication:per primary team Disposition:ICU pending CRRT and vent liberation  DErskine EmeryMD PCCM

## 2019-06-09 NOTE — Progress Notes (Signed)
Occupational Therapy Treatment Patient Details Name: Brendan [name] MRN: [id number] DOB: [birth year] Today's Date: 06/09/2019    History of present illness Pt presented with SOB and fatigue at [facility] found to have new HFrEF,  NSTEMI, and bilateral pleural effusions transferred to [facility] on 3/29 for further cardiac evaluation.  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  On the evening of 4/1, patient developed worsening respiratory distress and hypoxia, head R/L heart cath on 3/30 & R heart cath on 4/1 pt intubated on 4/1, extubated 4/4, had R & L chest tubes placed 4/2 due to bilateral pleural effusions, removed 4/5 and impella device placed 4/2.  CABG and MVR on 4/6. Extubated 4/7. Removal of impella 4/9. CVVH started 4/10 and off 4/30. Respiratory distress and reintubated 4/12. Extubated 4/14. Reintubated 4/15. Trach 4/16.  CRRT off 4/29 then restarted 5/7 now off again. Trach collar 5/2 with return to vent 5/7. PMHx: Tobacco abuse, HTN, poorly controlled DM, diabetic neuropathy.   OT comments  Pt progressing to sit to stands with ModA and use of stedy+2 assist for transfer to recliner. Pt attempting BUE HEP, but pt easily fatigued after transfer and sitting unsupported at times. Pt's O2 on 40% O2 5 PEEP. Pt off of CRRT. HR sustained at 80-90 BPM and BP with norepinephrine in supine 93/56; sitting 79/47 and sitting after exertion: 128/72. Pt would benefit from continued OT skilled services for ADL, mobility and safety in post acute care setting. Goals updated. OT following acutely.   Follow Up Recommendations  LTACH    Equipment Recommendations  Other (comment)(defer)    Recommendations for Other Services      Precautions / Restrictions Precautions Precautions: Sternal;Fall Precaution Comments: trach on vent, chest tube, flexiseal Restrictions Weight Bearing Restrictions: No       Mobility Bed Mobility Overal bed mobility: Needs Assistance Bed Mobility: Supine to Sit           General bed  mobility comments: foot egress to chair position and tolerating well  Transfers Overall transfer level: Needs assistance Equipment used: Ambulation equipment used Transfers: Sit to/from Stand;Stand Pivot Transfers Sit to Stand: Mod assist;+2 safety/equipment Stand pivot transfers: Mod assist;+2 safety/equipment       General transfer comment: Pt up for transfer today    Balance Overall balance assessment: Needs assistance Sitting-balance support: Feet supported Sitting balance-Leahy Scale: Poor Sitting balance - Comments: using stedy for stability Postural control: Left lateral lean Standing balance support: Bilateral upper extremity supported   Standing balance comment: stedy x1 sit to stand from elevated bed; pt sit to stand x1 from recliner                           ADL either performed or assessed with clinical judgement   ADL Overall ADL's : Needs assistance/impaired                                     Functional mobility during ADLs: Moderate assistance;+2 for physical assistance General ADL Comments: Pt performing foot egress to EOB to standing and pivotting to recliner with modA and stedy;  BUE HEP with intermittent cues to stay alert.     Vision   Vision Assessment?: No apparent visual deficits   Perception     Praxis      Cognition Arousal/Alertness: Awake/alert Behavior During Therapy: Flat affect Overall Cognitive Status: Impaired/Different from baseline  Area of Impairment: Following commands                       Following Commands: Follows one step commands with increased time;Follows multi-step commands inconsistently       General Comments: pt mouthing responses and following 1 step commands        Exercises     Shoulder Instructions       General Comments O2 on 40% O2 5 PEEP. Pt off of CRRT. HR sustained at 80-90 BPM and BP with norepinephrine in supine 93/56; sitting 79/47 and sitting after exertion:  128/72/    Pertinent Vitals/ Pain       Pain Assessment: Faces Faces Pain Scale: Hurts a little bit Pain Location: "all over" Pain Descriptors / Indicators: Discomfort;Grimacing Pain Intervention(s): Monitored during session;RN gave pain meds during session;Patient requesting pain meds-RN notified  Home Living                                          Prior Functioning/Environment              Frequency  Min 2X/week        Progress Toward Goals  OT Goals(current goals can now be found in the care plan section)  Progress towards OT goals: Progressing toward goals  Acute Rehab OT Goals Patient Stated Goal: not stated OT Goal Formulation: Patient unable to participate in goal setting Time For Goal Achievement: 06/23/19 Potential to Achieve Goals: Good ADL Goals Pt Will Perform Grooming: with supervision;sitting Pt Will Perform Lower Body Dressing: with mod assist;with adaptive equipment;sitting/lateral leans;sit to/from stand Pt/caregiver will Perform Home Exercise Program: Increased strength;Both right and left upper extremity;With Supervision Additional ADL Goal #1: Pt  will verbalize 3 strategies to conserve energy during functional ADL and mobility tasks with no verbal cues Additional ADL Goal #2: Pt will increase to minA overall for bed mobility as precursor for OOB ADL. Additional ADL Goal #3: Pt will be able to verbalize/abide by sternal precautions with minimal cues required throughout session.  Plan Discharge plan remains appropriate    Co-evaluation                 AM-PAC OT "6 Clicks" Daily Activity     Outcome Measure   Help from another person eating meals?: Total Help from another person taking care of personal grooming?: A Lot Help from another person toileting, which includes using toliet, bedpan, or urinal?: Total Help from another person bathing (including washing, rinsing, drying)?: Total Help from another person to put on  and taking off regular upper body clothing?: A Lot Help from another person to put on and taking off regular lower body clothing?: Total 6 Click Score: 8    End of Session Equipment Utilized During Treatment: Oxygen;Gait belt  OT Visit Diagnosis: Unsteadiness on feet (R26.81);Muscle weakness (generalized) (M62.81);Other symptoms and signs involving cognitive function Pain - part of body: ("all over")   Activity Tolerance Patient tolerated treatment well   Patient Left in chair;with call bell/phone within reach;with chair alarm set   Nurse Communication Mobility status        Time: 6195-0932 OT Time Calculation (min): 39 min  Charges: OT General Charges $OT Visit: 1 Visit OT Treatments $Therapeutic Activity: 38-52 mins  Jefferey Pica, OTR/L Acute Rehabilitation Services Pager: [phone] Office: [phone]    Addelyn Alleman C 06/09/2019, 4:20  PM

## 2019-06-09 NOTE — Progress Notes (Signed)
0700-1100 Pt up to chair with stedy, 1 RN, and 1 OT. Stayed in chair x 1 hr then back to bed with overhead lift at the pt's request Each time, pt appears orthostatic, requiring levo up to 40mcg during transfers Pt's wife called; updated and all questions answered  1100-1500 Switch to PS/CPAP; tolerating well C/o SOB; switched back to [facility] after 4hr on PS  Family meeting held with pt's wife and daughter (see palliative care note for details); pt declined attendance at the meeting. Family believes this is r/t his stress surrounding goals discussions. I did not attend the meeting but understand that the upcoming graduation of pt's grandson is a milestone for the family. They wish to continue all measures through June 2, on which date we will facilitate pt's participation in his grandson's graduation celebrations. Family is amenable to a f/u conversation, June 3, when they will consider the pt's course over the upcoming 10 days and make decisions about how they wish to proceed.  1500-1900 Family with questions about visitation status after meeting; relayed to director and awaiting response. Response from unit director:  Visitation rules will remain the same unless pt is transitioned to comfort care. This means that the pt's two visitors, Cecille Rubin (wife) and Lawerance Bach (daughter), will remain his only visitors. If the pt were to transition to comfort care, he would then be able to have other visitors, like his grandson or sister in law. Family is not at bedside, so I will call to update.  Pt c/o nausea. Zofran PRN given and TF held x 1 hr  Pt's wife and daughter updated about visitor status at bedside. Expressed understanding. They also expressed that they have unit director's business card and understand that they may call with questions.

## 2019-06-09 NOTE — Progress Notes (Signed)
Palliative:  HPI: Palliative Care consult requested for goals of care discussion in this63 y.o.malewith multiple medical problems including poorly controlled diabetes complicated by neuropathy and hypertension. Patient presented to outside hospital on 3/26 and transferred to [facility] for further evaluation due to elevated troponin and newly depressed EF. During his initial work-up patient was found to have a blood sugar of 516, BUN53, creatinine 1.2, and anion gap of 17. Patient's BNP was elevated to 8900. Troponin was elevated to 13.1 with changes noted by the team of their lateral leads as well as V1 and V2.Chest x-ray showed concerns for bilateral pulmonary opacities. Patient was initiated on aspirin, heparin drip, and IV antibiotics. Recent echo showed EF of 25%. Since admission patient required intubation on 4/1, extubated on 4/4, re-intubated 4/6 for CABG 4/6, extubated post procedure 4/7, CRRT initiated on 4/10 with brief transition to iHD and CRRT began again 5/7, required re-intubation on 4/12 due to respiratory failure, tracheostomy performed on 4/16, 5/7 right chest tube placed due to enlarging effusion. Continues to require midodrine and pressors as well as ventilator support.CRRT holiday with trial of iHD planned for 5/25.   I met today with Mr. Sabedra wife, Cecille Rubin, and daughter, Lawerance Bach along with Dr. Haroldine Laws, Dr. Hulen Skains, and [facility] unit director. My goal in attending this meeting is to stay aligned with plan of care and knowing how to best continue supporting Mr. Nylen and his family during a difficult and prolonged hospitalization.   Time was allotted to explain to [place] (with Mr. Berkovich permission to speak outside the room) current status of health, plan to move forward, and concerns with potential impending futility of care by Dr. Haroldine Laws. Fear that the medical team may be nearing an end point of options and interventions available to change this situation for Mr.  Bonenberger. If he continues to decline, or with lack of progress, it was explained to family that the concern could be that we are causing him more harm than benefit by putting him through continued aggressive and invasive medical interventions that are causing him discomfort and suffering. Very clear that family do not want to see him suffer and would not want this level of suffering to continue indefinitely.   Cecille Rubin and Lawerance Bach continue to advocate for Mr. Escandon and are understandably distressed by the fact he is so very alert and aware of everything making this situation so much more difficult as they share he is clearly expressing to them that he is not ready to die and wants to live. The medical team wishes to continue to support them in this goal but also recognizing that his physical body continues to fail. Agreement to continue with full aggressive care until at least June 2nd when grandson graduates high school and [employer], [employer] after graduation. We will assist to ensure he can virtually experience the graduation.   I would also encourage during this trial time period that all measures are taken to prolong life as requested but also to optimize quality of life and meaningful interactions with family at bedside. I cannot help but wonder if a large part of Mr. Gabriel anxiety is coming from a fear of dying (fear of suffering? fear of leaving family?) and if there are steps we can take even now to begin to give him comfort and peace or even closure just in case he does not improve (although ideally he will have medical improvement over this trial period). I would encourage family to help [place] know how to  fill any of these gaps for him in the meantime.   I will continue to follow and provide support to Mr. Bulthuis and family.   Exam: Alert, appears oriented. Appears fatigued. Thin, frail, pallor. HR normal rate. Breathing slightly increased work of breathing on ventilator pressure support. Abd flat.   Plan: -  Continue full aggressive care with hopes of improvement.  - Goal to virtually experience his grandson's high school graduation June 2nd 7pm.   60 min  Vinie Sill, NP Palliative Medicine Team Pager [phone] (Please see [email or web address] for schedule) Team Phone [phone]    Greater than 50%  of this time was spent counseling and coordinating care related to the above assessment and plan

## 2019-06-09 NOTE — Progress Notes (Signed)
Patient ID: Brendan [name], male   DOB: 03-28-1955, 64 y.o.   MRN: [id number] TCTS Evening Rounds:  Hemodynamically stable with borderline BP on dobut 5, NE 4.  CVP 6-7 today.  Remains on vent.  BMET    Component Value Date/Time   NA 131 (L) 06/09/2019 1627   K 5.4 (H) 06/09/2019 1627   CL 99 06/09/2019 1627   CO2 26 06/09/2019 1627   GLUCOSE 184 (H) 06/09/2019 1627   BUN 25 (H) 06/09/2019 1627   CREATININE 1.73 (H) 06/09/2019 1627   CALCIUM 8.3 (L) 06/09/2019 1627   GFRNONAA 42 (L) 06/09/2019 1627   GFRAA 48 (L) 06/09/2019 1627

## 2019-06-09 NOTE — Progress Notes (Signed)
38 Days Post-Op Procedure(s) (LRB): VIDEO BRONCHOSCOPY USING DISPOSABLE ANESTHESIA SCOPE (N/A) TRACHEOSTOMY (N/A) Subjective: Results of goals of care meeting noted Will cont current support until grandson graduate from [employer] [facility] [employer] in early June then re-assess Will attempt iHD tomorrow CXR today wet - leave on vent Objective: Vital signs in last 24 hours: Temp:  [97.2 F (36.2 C)-97.7 F (36.5 C)] 97.7 F (36.5 C) (05/24 1153) Pulse Rate:  [68-79] 75 (05/24 1300) Cardiac Rhythm: Normal sinus rhythm (05/24 1400) Resp:  [15-28] 28 (05/24 1455) BP: (61-128)/(47-82) 102/65 (05/24 1445) SpO2:  [89 %-100 %] 94 % (05/24 1455) FiO2 (%):  [40 %] 40 % (05/24 1501) Weight:  [65.7 kg] 65.7 kg (05/24 0500)  Hemodynamic parameters for last 24 hours: CVP:  [4 mmHg-35 mmHg] 6 mmHg  Intake/Output from previous day: 05/23 0701 - 05/24 0700 In: 2304.9 [I.V.:864.9; NG/GT:1440] Out: 1955 [Stool:900] Intake/Output this shift: Total I/O In: 607.2 [I.V.:262.2; NG/GT:345] Out: -        Exam    General- sedated, comfortable    Neck- no JVD, no cervical adenopathy palpable, no carotid bruit   Lungs- diminished   Cor- regular rate and rhythm, no murmur , gallop   Abdomen- soft, non-tender   Extremities - warm, non-tender, minimal edema   Neuro- oriented, appropriate, no focal weakness   Lab Results: Recent Labs    06/08/19 0254 06/09/19 0301  WBC 4.9 4.9  HGB 8.2* 8.0*  HCT 26.3* 26.0*  PLT 79* 88*   BMET:  Recent Labs    06/09/19 0301 06/09/19 0837  NA 131* 130*  K 5.3* 5.2*  CL 100 98  CO2 26 25  GLUCOSE 298* 94  BUN 16 19  CREATININE 1.36* 1.55*  CALCIUM 8.4* 8.5*    PT/INR: No results for input(s): LABPROT, INR in the last 72 hours. ABG    Component Value Date/Time   PHART 7.426 06/02/2019 0443   HCO3 26.3 06/02/2019 0443   TCO2 27 06/02/2019 0443   ACIDBASEDEF 3.0 (H) 04/28/2019 0735   O2SAT 47.9 06/09/2019 0301   CBG (last 3)  Recent Labs    06/09/19 0400  06/09/19 0754 06/09/19 1151  GLUCAP 103* 82 174*    Assessment/Plan: S/P Procedure(s) (LRB): VIDEO BRONCHOSCOPY USING DISPOSABLE ANESTHESIA SCOPE (N/A) TRACHEOSTOMY (N/A) Cont current care   LOS: 56 days    Brendan [name] 06/09/2019

## 2019-06-09 NOTE — Progress Notes (Signed)
SLP Cancellation Note  Patient Details Name: Brendan [name] MRN: [id number] DOB: 03-Feb-1955   Cancelled treatment:       Reason Eval/Treat Not Completed: Medical issues which prohibited therapy(Pt remains on vent at this time. SLP will follow up. )  Tova Vater I. Hardin Negus, [place], [place] Office number [phone] Pager [place] 06/09/2019, 1:24 PM

## 2019-06-09 NOTE — Progress Notes (Signed)
Patient ID: Brendan [name], male   DOB: 21-Nov-1955, 64 y.o.   MRN: [id number]     Advanced Heart Failure Rounding Note  PCP-Cardiologist: No primary care provider on file.   Subjective:    Remains on vent through trach - now on PS  CRRT paused yesterday with plans to attempt iHD today  PT got him up to chair today. BP dropped into low 70s. NE up to 4. Remains on dobutamine. Also on midodrine 20 tid and florinef.  Co-ox back down to 48% -> suggesting recurrent cardiogenic shock with low output.   Awake. Following commands.    Objective:   Weight Range: 65.7 kg Body mass index is 20.2 kg/m.   Vital Signs:   Temp:  [97.2 F (36.2 C)-97.7 F (36.5 C)] 97.7 F (36.5 C) (05/24 0754) Pulse Rate:  [65-79] 70 (05/24 0630) Resp:  [15-27] 27 (05/24 0754) BP: (77-127)/(47-74) 77/52 (05/24 0630) SpO2:  [80 %-100 %] 98 % (05/24 0754) FiO2 (%):  [40 %] 40 % (05/24 0754) Weight:  [65.7 kg] 65.7 kg (05/24 0500) Last BM Date: 06/08/19  Weight change: Filed Weights   06/07/19 0500 06/08/19 0500 06/09/19 0500  Weight: 69.8 kg 66 kg 65.7 kg    Intake/Output:   Intake/Output Summary (Last 24 hours) at 06/09/2019 0902 Last data filed at 06/09/2019 0600 Gross per 24 hour  Intake 2076.48 ml  Output 1654 ml  Net 422.48 ml      Physical Exam   General:  Weak/fatigue appearing WM, sitting up in chair Cachetic HEENT: normal +cor-trak Neck: supple. no JVD. + trach Carotids 2+ bilat; no bruits. No lymphadenopathy or thryomegaly appreciated. Cor: PMI nondisplaced. Regular rate & rhythm. No rubs, gallops or murmurs. Lungs: diffuse crackles Abdomen: soft, nontender, nondistended. No hepatosplenomegaly. No bruits or masses. Good bowel sounds. Extremities: no cyanosis, clubbing, rash, 1+ sacral edema Neuro: alert & orientedx3, cranial nerves grossly intact. moves all 4 extremities w/o difficulty. Affect pleasant    Telemetry   NSR 60-70s Personally reviewed   Labs    CBC Recent  Labs    06/08/19 0254 06/09/19 0301  WBC 4.9 4.9  HGB 8.2* 8.0*  HCT 26.3* 26.0*  MCV 96.7 96.3  PLT 79* 88*   Basic Metabolic Panel Recent Labs    06/08/19 0254 06/08/19 0254 06/08/19 1643 06/09/19 0301  NA 133*   < > 132* 131*  K 4.5   < > 4.8 5.3*  CL 100   < > 99 100  CO2 27   < > 27 26  GLUCOSE 303*   < > 360* 298*  BUN 9   < > 10 16  CREATININE 0.83   < > 0.96 1.36*  CALCIUM 7.9*   < > 8.1* 8.4*  MG 2.2  --   --  2.3  PHOS 1.4*   < > 2.0* 2.1*   < > = values in this interval not displayed.   Liver Function Tests Recent Labs    06/06/19 1001 06/06/19 1632 06/08/19 1643 06/09/19 0301  AST 24  --   --   --   ALT 14  --   --   --   ALKPHOS 393*  --   --   --   BILITOT 1.0  --   --   --   PROT 5.6*  --   --   --   ALBUMIN 2.0*   < > 2.3* 2.2*   < > = values in this interval not  displayed.   Recent Labs    06/06/19 1001  AMYLASE 26*   Cardiac Enzymes No results for input(s): CKTOTAL, CKMB, CKMBINDEX, TROPONINI in the last 72 hours.  BNP: BNP (last 3 results) Recent Labs    03/24/2019 0113  BNP 655.7*    ProBNP (last 3 results) No results for input(s): PROBNP in the last 8760 hours.   D-Dimer No results for input(s): DDIMER in the last 72 hours. Hemoglobin A1C No results for input(s): HGBA1C in the last 72 hours. Fasting Lipid Panel No results for input(s): CHOL, HDL, LDLCALC, TRIG, CHOLHDL, LDLDIRECT in the last 72 hours. Thyroid Function Tests No results for input(s): TSH, T4TOTAL, T3FREE, THYROIDAB in the last 72 hours.  Invalid input(s): FREET3  Other results:   Imaging    DG Chest 1 View  Result Date: 06/09/2019 CLINICAL DATA:  CHF EXAM: CHEST  1 VIEW COMPARISON:  06/07/2019 FINDINGS: Changes of CABG and valve replacement. Right chest tube remains in place. No pneumothorax. Remainder support devices are stable. Severe diffuse bilateral airspace disease is slightly worsened since prior study. No visible effusions. IMPRESSION:  Worsening severe diffuse bilateral airspace disease. Electronically Signed   By: Rolm Baptise M.D.   On: 06/09/2019 07:22     Medications:     Scheduled Medications: . aspirin EC  325 mg Oral Daily   Or  . aspirin  324 mg Per Tube Daily  . B-complex with vitamin C  1 tablet Per Tube Daily  . chlorhexidine gluconate (MEDLINE KIT)  15 mL Mouth Rinse BID  . Chlorhexidine Gluconate Cloth  6 each Topical Q0600  . citalopram  10 mg Per Tube Daily  . collagenase   Topical Daily  . darbepoetin (ARANESP) injection - NON-DIALYSIS  100 mcg Subcutaneous Q Tue-1800  . docusate  200 mg Per Tube Daily  . feeding supplement (PRO-STAT SUGAR FREE 64)  30 mL Per Tube BID  . feeding supplement (VITAL 1.5 CAL)  1,000 mL Per Tube Q24H  . fentaNYL  1 patch Transdermal Q72H  . fludrocortisone  0.2 mg Oral Daily  . gabapentin  100 mg Oral QHS  . insulin aspart  0-6 Units Subcutaneous Q4H  . mouth rinse  15 mL Mouth Rinse 10 times per day  . melatonin  3 mg Per Tube QHS  . metoCLOPramide (REGLAN) injection  10 mg Intravenous Q6H  . midodrine  20 mg Per Tube Q8H  . pantoprazole sodium  40 mg Per Tube BID  . polyethylene glycol  17 g Oral Daily  . QUEtiapine  25 mg Oral QHS  . rOPINIRole  0.5 mg Oral QHS  . sennosides  5 mL Per Tube BID  . sodium chloride flush  10-40 mL Intracatheter Q12H  . sodium zirconium cyclosilicate  10 g Oral Once    Infusions: .  prismasol BGK 4/2.5 500 mL/hr at 06/08/19 0756  .  prismasol BGK 4/2.5 300 mL/hr at 06/08/19 0404  . sodium chloride 10 mL/hr at 06/02/19 1500  . sodium chloride Stopped (05/31/19 0958)  . dextrose 30 mL/hr at 06/09/19 0600  . dextrose 5 % and 0.9% NaCl Stopped (06/06/19 0513)  . DOBUTamine 5 mcg/kg/min (06/09/19 0600)  . heparin    . norepinephrine (LEVOPHED) Adult infusion 2 mcg/min (06/09/19 0600)  . prismasol BGK 4/2.5 1,000 mL/hr at 06/08/19 0603    PRN Medications: sodium chloride, alum & mag hydroxide-simeth, dextrose, diphenhydrAMINE,  fentaNYL (SUBLIMAZE) injection, Gerhardt's butt cream, heparin, heparin, levalbuterol, ondansetron (ZOFRAN) IV, pneumococcal 23 valent  vaccine, sodium chloride flush, traMADol     Assessment/Plan   1. Acute systolic HF ->  Cardiogenic Shock  - Due to iCM. EF 20-25% - Impella 5.5 placed on 4/2.   - s/p CABG/MVRepair on 4/6  - Impella out 4/9.  - Echo 4/10 with EF 25-30%, normal RV, no MR.  - Echo 4/22 EF 30-35% mildly reduced RV MVR ok   - Dobutamine added 5/14 for low co-ox 39% and inability to wean NE.  - Dobutamine increased to 5 on 5/21 due to low BP.Remains on NE at 4, midodrine at 20 tid and florinef to support BP. Co-ox back down to 48% despite dual pressors suggesting worsening cardiac output/recurrent shock - Repeat echo 05/29/19. EF 35-40% RV ok.  - He continues to struggle with shock and hypotension despite full support. I am increasingly concerned that we will not be able to liberate him from the IV drips.    2. 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 Echo stable - No s/s ischemia - On ASA/Crestor  3. Acute hypoxic respiratory failure - Due to pulmonary edema and PNA - Extubated 4/9 re-intubated on 4/12. Failed re-extubation on 4/14. Now s/p tracheostomy - S/P Bronch 4/27 - 60K yeast. -> ? Colonization.  - CT scan 5/7 with diffuse bilateral infiltrates c/w ARDS and large bilateral effusions R>L.  - s/p R chest tube. No significant drainage - Remains on vent FiO2 40% - CCM following. On PS support this am. Failed trach collar trial over the weekend.  - CXR and exam with worsening edema. Trying to get some fluid off to progress but in order to get more fluid off with HD have to increase pressors.   4. Mitral Regurgitation - Mod-severe on ECHO - s/p MV repair, TEE 4/9 with minimal MR s/p repair.   - Echo 4/10 with no significant MR.  - echo 4/22 MVR stable - Repeat echo 05/29/19. EF 35-40% RV ok.  MVR ok  5. Uncontrolled DM - Hgb A1C 9.  -  On insulin and sliding scale.  - No change  6. AKI - due to shock - Remains anuric post-op - Continue CVVHD per Nephrology. Plan to stop tomorrow and give him a break to see if kidneys will respond.  - This remains the main issue currently. Unable to wean to iHD due to pressor dependence. It has now been > 4 weeks. We have tried many options to get him of pressors including blood transfusions, albumin, letting fluid re-accumulate, midodrine at 20 tid but remain unsuccessful. - Unfortunately, he is worse today with recurrent shock and volume overload requiring increased pressors. I am increasingly concerned we may not have a way out for him.   7. Acute blood loss Anemia  - Transfused 1UPRBCs 5/9/21and 5/14, 5/17 and 5/19  - hgb 8.0 today.  - transfuse hgb <= 7.5   8. Polymorphic VT - Recurrent VT during respiratory distress 4/14, - Off amio  - No further VT.  - Keep K> 4.0 Mg > 2.0  - Rhythm stable   9. Severe Malnutrition - Prealbumin improved, up from 8.9>>18.7 - Nutrition on board - Continue w/ TFs  10. Atrial fibrillation. paroxysmal - remains in NSR today. No change - Amiodarone was stopped.   11. Debility, severe - Continue PT/OT  12. Chronic pain issues - appears comfortable - having severe constipation. CCM managing  - On Oxy q4 PRN. Titrate down as tolerated as  this has been causing severe constipation   I spoke to his wife (bedside) and daughter (phone) over the weekend and I have asked his daughter to meet with [place] so we can continue to discuss in person. She has been following notes in his charts closely and has many questions about the specifics of his care. She says she can meet [place] today at 2pm.   I have offered his wife transfer to a tertiary center for a second opinion if they desire.   CRITICAL CARE Performed by: Glori Bickers  Total critical care time: 40 minutes  Critical care time was exclusive of separately billable procedures and treating  other patients.  Critical care was necessary to treat or prevent imminent or life-threatening deterioration.  Critical care was time spent personally by me (independent of midlevel providers or residents) on the following activities: development of treatment plan with patient and/or surrogate as well as nursing, discussions with consultants, evaluation of patient's response to treatment, examination of patient, obtaining history from patient or surrogate, ordering and performing treatments and interventions, ordering and review of laboratory studies, ordering and review of radiographic studies, pulse oximetry and re-evaluation of patient's condition.    Glori Bickers, MD  9:02 AM

## 2019-06-10 LAB — RENAL FUNCTION PANEL
Albumin: 2.1 g/dL — ABNORMAL LOW (ref 3.5–5.0)
Albumin: 2.2 g/dL — ABNORMAL LOW (ref 3.5–5.0)
Anion gap: 10 (ref 5–15)
Anion gap: 8 (ref 5–15)
BUN: 35 mg/dL — ABNORMAL HIGH (ref 8–23)
BUN: 37 mg/dL — ABNORMAL HIGH (ref 8–23)
CO2: 25 mmol/L (ref 22–32)
CO2: 26 mmol/L (ref 22–32)
Calcium: 8.5 mg/dL — ABNORMAL LOW (ref 8.9–10.3)
Calcium: 8.5 mg/dL — ABNORMAL LOW (ref 8.9–10.3)
Chloride: 94 mmol/L — ABNORMAL LOW (ref 98–111)
Chloride: 93 mmol/L — ABNORMAL LOW (ref 98–111)
Creatinine, Ser: 2.04 mg/dL — ABNORMAL HIGH (ref 0.61–1.24)
Creatinine, Ser: 2.22 mg/dL — ABNORMAL HIGH (ref 0.61–1.24)
GFR calc Af Amer: 40 mL/min — ABNORMAL LOW (ref >60)
GFR calc Af Amer: 36 mL/min — ABNORMAL LOW (ref >60)
GFR calc non Af Amer: 34 mL/min — ABNORMAL LOW (ref >60)
GFR calc non Af Amer: 31 mL/min — ABNORMAL LOW (ref >60)
Glucose, Bld: 236 mg/dL — ABNORMAL HIGH (ref 70–99)
Glucose, Bld: 283 mg/dL — ABNORMAL HIGH (ref 70–99)
Phosphorus: 3 mg/dL (ref 2.5–4.6)
Phosphorus: 3.3 mg/dL (ref 2.5–4.6)
Potassium: 5.7 mmol/L — ABNORMAL HIGH (ref 3.5–5.1)
Potassium: 5.4 mmol/L — ABNORMAL HIGH (ref 3.5–5.1)
Sodium: 129 mmol/L — ABNORMAL LOW (ref 135–145)
Sodium: 127 mmol/L — ABNORMAL LOW (ref 135–145)

## 2019-06-10 LAB — GLUCOSE, CAPILLARY
Glucose-Capillary: 128 mg/dL — ABNORMAL HIGH (ref 70–99)
Glucose-Capillary: 129 mg/dL — ABNORMAL HIGH (ref 70–99)
Glucose-Capillary: 149 mg/dL — ABNORMAL HIGH (ref 70–99)
Glucose-Capillary: 155 mg/dL — ABNORMAL HIGH (ref 70–99)
Glucose-Capillary: 177 mg/dL — ABNORMAL HIGH (ref 70–99)
Glucose-Capillary: 208 mg/dL — ABNORMAL HIGH (ref 70–99)
Glucose-Capillary: 98 mg/dL (ref 70–99)

## 2019-06-10 LAB — CBC
HCT: 27 % — ABNORMAL LOW (ref 39.0–52.0)
Hemoglobin: 8.2 g/dL — ABNORMAL LOW (ref 13.0–17.0)
MCH: 29.3 pg (ref 26.0–34.0)
MCHC: 30.4 g/dL (ref 30.0–36.0)
MCV: 96.4 fL (ref 80.0–100.0)
Platelets: 112 10*3/uL — ABNORMAL LOW (ref 150–400)
RBC: 2.8 MIL/uL — ABNORMAL LOW (ref 4.22–5.81)
RDW: 16.1 % — ABNORMAL HIGH (ref 11.5–15.5)
WBC: 5.8 10*3/uL (ref 4.0–10.5)
nRBC: 0 % (ref 0.0–0.2)

## 2019-06-10 LAB — MAGNESIUM: Magnesium: 2.5 mg/dL — ABNORMAL HIGH (ref 1.7–2.4)

## 2019-06-10 LAB — COOXEMETRY PANEL
Carboxyhemoglobin: 1.3 % (ref 0.5–1.5)
Carboxyhemoglobin: 1.5 % (ref 0.5–1.5)
Methemoglobin: 0.9 % (ref 0.0–1.5)
Methemoglobin: 1.2 % (ref 0.0–1.5)
O2 Saturation: 81.5 %
O2 Saturation: 59.5 %
Total hemoglobin: 8.3 g/dL — ABNORMAL LOW (ref 12.0–16.0)
Total hemoglobin: 8.2 g/dL — ABNORMAL LOW (ref 12.0–16.0)

## 2019-06-10 MED ORDER — FUROSEMIDE 10 MG/ML IJ SOLN
120.0000 mg | Freq: Once | INTRAVENOUS | Status: AC
  Administered 2019-06-10: 120 mg via INTRAVENOUS
  Filled 2019-06-10: qty 12

## 2019-06-10 MED ORDER — ALBUMIN HUMAN 25 % IV SOLN
INTRAVENOUS | Status: AC
  Filled 2019-06-10: qty 100

## 2019-06-10 MED ORDER — SORBITOL 70 % SOLN
15.0000 mL | Freq: Every morning | Status: AC
  Administered 2019-06-10: 15 mL via ORAL
  Filled 2019-06-10: qty 30

## 2019-06-10 MED ORDER — HEPARIN SODIUM (PORCINE) 1000 UNIT/ML IJ SOLN
INTRAMUSCULAR | Status: AC
  Filled 2019-06-10: qty 4

## 2019-06-10 MED ORDER — SODIUM ZIRCONIUM CYCLOSILICATE 10 G PO PACK
10.0000 g | PACK | Freq: Every day | ORAL | Status: DC
  Administered 2019-06-10: 10 g
  Filled 2019-06-10 (×2): qty 1

## 2019-06-10 NOTE — Progress Notes (Signed)
Physical Therapy Treatment Patient Details Name: Brendan [name] MRN: [id number] DOB: December 23, 1955 Today's Date: 06/10/2019    History of Present Illness Pt presented with SOB and fatigue at [facility] found to have new HFrEF,  NSTEMI, and bilateral pleural effusions transferred to [facility] on 3/29 for further cardiac evaluation.  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  On the evening of 4/1, patient developed worsening respiratory distress and hypoxia, head R/L heart cath on 3/30 & R heart cath on 4/1 pt intubated on 4/1, extubated 4/4, had R & L chest tubes placed 4/2 due to bilateral pleural effusions, removed 4/5 and impella device placed 4/2.  CABG and MVR on 4/6. Extubated 4/7. Removal of impella 4/9. CVVH started 4/10 and off 4/30. Respiratory distress and reintubated 4/12. Extubated 4/14. Reintubated 4/15. Trach 4/16.  CRRT off 4/29 then restarted 5/7 now off again. Trach collar 5/2 with return to vent 5/7. PMHx: Tobacco abuse, HTN, poorly controlled DM, diabetic neuropathy.    PT Comments    Pt required increased verbal, tactile cues to maintain arousal level during session. Pt was inconsistently responsive to 1 step commands (occasionally nodding head or giving thumbs up/down). Able to sit pt up via head egress with vital signs remaining stable(see below). Attempted therex with inconsistency with participation and following directions. Pt left in long sitting in bed with chair egress, nursing aware. Will continue to follow acutely.  BP rest 85/61 BP sitting up in long sit 112/67 BP sitting up in chair egress 119/73 BP post mobility 94/62    Follow Up Recommendations  LTACH     Equipment Recommendations  None recommended by PT    Recommendations for Other Services       Precautions / Restrictions Precautions Precautions: Sternal;Fall Precaution Comments: trach on vent, chest tube, flexiseal    Mobility  Bed Mobility Overal bed mobility: Needs Assistance       Supine to sit: Total  assist     General bed mobility comments: foot egress to chair position, attempted LE therex in this position, limited by pt inconsistently responsive to command following.  Transfers                    Ambulation/Gait             General Gait Details: unable   Stairs             Wheelchair Mobility    Modified Rankin (Stroke Patients Only)       Balance Overall balance assessment: Needs assistance Sitting-balance support: Feet unsupported Sitting balance-Leahy Scale: Poor                                      Cognition Arousal/Alertness: Awake/alert Behavior During Therapy: Flat affect Overall Cognitive Status: Impaired/Different from baseline Area of Impairment: Following commands;Awareness;Problem solving                   Current Attention Level: Focused   Following Commands: Follows one step commands inconsistently     Problem Solving: Slow processing;Decreased initiation;Difficulty sequencing;Requires verbal cues;Requires tactile cues General Comments: Pt inconsistent non-verbal responses to 1 step commands (pt occasionally able to nod head for yes/no or give thumbs up/down). Pt required tactile and verbal cues for arousal and therex. Pt demonstrated greater arousal  with bed egressed into chair position.      Exercises Total Joint Exercises Ankle Circles/Pumps: PROM;5  reps;Both;Seated Hip ABduction/ADduction: AAROM;5 reps;Both;Seated Long Arc Quad: AROM;15 reps;Both;Seated    General Comments General comments (skin integrity, edema, etc.): Pt BP remained above 85/61 during session.      Pertinent Vitals/Pain Faces Pain Scale: Hurts a little bit Pain Descriptors / Indicators: Discomfort;Grimacing Pain Intervention(s): Limited activity within patient's tolerance;Monitored during session    Home Living                      Prior Function            PT Goals (current goals can now be found in the  care plan section) Acute Rehab PT Goals Patient Stated Goal: not stated    Frequency    Min 2X/week      PT Plan Current plan remains appropriate    Co-evaluation              AM-PAC PT "6 Clicks" Mobility   Outcome Measure  Help needed turning from your back to your side while in a flat bed without using bedrails?: A Lot Help needed moving from lying on your back to sitting on the side of a flat bed without using bedrails?: A Lot Help needed moving to and from a bed to a chair (including a wheelchair)?: Total Help needed standing up from a chair using your arms (e.g., wheelchair or bedside chair)?: Total Help needed to walk in hospital room?: Total Help needed climbing 3-5 steps with a railing? : Total 6 Click Score: 8    End of Session Equipment Utilized During Treatment: Gait belt;Oxygen Activity Tolerance: Other (comment)(Pt limited by cognition) Patient left: in bed;with call bell/phone within reach Nurse Communication: Mobility status PT Visit Diagnosis: Other abnormalities of gait and mobility (R26.89);Muscle weakness (generalized) (M62.81)     Time: 6270-3500 PT Time Calculation (min) (ACUTE ONLY): 25 min  Charges:  $Therapeutic Exercise: 8-22 mins $Therapeutic Activity: 8-22 mins                     [employer] SPT 06/10/2019    Rolland Porter 06/10/2019, 3:29 PM

## 2019-06-10 NOTE — Progress Notes (Signed)
Patient became hypotensive during hemodialysis with blood pressure 65/45 (53).  Patient on 5 mcg of Levophed.  Hemodialysis treatment discontinued due to hypotension.   Patient's blood pressure improving to 82/56 (65) after hemodialysis treatment discontinued.  MD Prescott Gum rounding at bedside and informed of patient status.

## 2019-06-10 NOTE — Progress Notes (Addendum)
[facility] Progress Note   [facility], new low EF and NSTEMI txd to [facility] 3/29: 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 Admission Scr was 0.99.  Assessment/ Plan:   1. Acute hypoxic respiratory failure on 05/23/19- on vent via trach. S/p right chest tube. Oliguric/anuricAKI- CRRT started on 4/10 and stopped 4/29 w/ attempted transition to IHD on 05/17/19 but failed and required resumption of CRRT 1. Remains anuric. 2. Resumed CRRT on 05/23/19 following acute worsening of his oxygenation and hypotension.  3. Paused CRRT 5/23 with plans for trial of iHD to assess if he will tolerate. 4. Plan to trial iHD today with concern given hypotension he may not tolerate it given hypotension.  If he is CRRT dependent he cannot leave the hospital.  We will use maneuvers to increase the chance he will tolerate iHD despite low BPs (UF profile, low temp, albumin).  If he doesn't tolerate will plan to resume CRRT later today in light of worsening K and volume status in light of GOC to maintain level of care through grandsons graduation.   3. Vascular access-LIJ tunneledcatheter placed by IR on 05/19/19 4. Anemia of CKD, multifactorial-transfusions per primary team. Hemoglobin 8.2 today. Darbo 179mg started 5/11. CTM and uptitrate as needed 5. CAD- 3 vessel with EF 25% s/p CABG and MVR 49/8/92complicated by cardiogenic shock as below 6. Acute systolic CHF and cardiogenic shock-  Dobutamine, NE, florinef, midodrine.  7. Electrolytes- K 5.7 this AM, iHD.   8. MR s/p MVR 9. Severe debility- will need LTACH if can tolerate iHD  Subjective:   GOC discussion noted from yesterday's palliative care note -- grandson graduating HS 6/2 and would like to participate Plan trial of iHD today - remains on dobutamine and low dose NE.  K up this AM   Objective:   BP (!)  82/56   Pulse 80   Temp 97.7 F (36.5 C) (Oral)   Resp (!) 27   Ht 5' 11"  (1.803 m) Comment: measured x 3  Wt 67.3 kg   SpO2 95%   BMI 20.69 kg/m   Intake/Output Summary (Last 24 hours) at 06/10/2019 1250 Last data filed at 06/10/2019 1215 Gross per 24 hour  Intake 2646.31 ml  Output -180 ml  Net 2826.31 ml   Weight change: 1.1 kg  Physical Exam: Gen: frail, thin, cachectic, drowsy in bed Neck: lef IJTunneled cath(placed 5/3) CVS: normal rate Resp:Coarse bilateral breath sounds, ventilated/trached, rt sided CT Abd: +BS, soft, NT/ND EJJH:[id number]+ lower extremity edema-atrophied legs  Imaging: DG Chest 1 View  Result Date: 06/09/2019 CLINICAL DATA:  CHF EXAM: CHEST  1 VIEW COMPARISON:  06/07/2019 FINDINGS: Changes of CABG and valve replacement. Right chest tube remains in place. No pneumothorax. Remainder support devices are stable. Severe diffuse bilateral airspace disease is slightly worsened since prior study. No visible effusions. IMPRESSION: Worsening severe diffuse bilateral airspace disease. Electronically Signed   By: KRolm BaptiseM.D.   On: 06/09/2019 07:22    Labs: BMET Recent Labs  Lab 06/07/19 0319 06/07/19 0319 06/07/19 1627 06/08/19 0254 06/08/19 1643 06/09/19 0301 06/09/19 0837 06/09/19 1627 06/10/19 0330  NA 133*   < > 133* 133* 132* 131* 130* 131* 129*  K 4.4   < > 4.2 4.5 4.8 5.3* 5.2* 5.4* 5.7*  CL 101   < > 101 100 99 100 98 99 94*  CO2 27   < > 28 27 27 26 25 26 25   GLUCOSE 306*   < > 301* 303* 360* 298* 94 184* 236*  BUN 9   < > 9 9 10 16 19  25* 35*  CREATININE 0.99   < > 0.92 0.83 0.96 1.36* 1.55* 1.73* 2.04*  CALCIUM 8.0*   < > 8.0* 7.9* 8.1* 8.4* 8.5* 8.3* 8.5*  PHOS 2.1*  --  1.8* 1.4* 2.0* 2.1*  --  2.7 3.0   < > = values in this interval not displayed.   CBC Recent Labs  Lab 06/07/19 0319 06/08/19 0254 06/09/19 0301 06/10/19 0901  WBC 3.6* 4.9 4.9 5.8  HGB 7.8* 8.2* 8.0* 8.2*  HCT 25.5* 26.3* 26.0* 27.0*  MCV 97.3  96.7 96.3 96.4  PLT 71* 79* 88* 112*    Medications:    . aspirin EC  325 mg Oral Daily   Or  . aspirin  324 mg Per Tube Daily  . B-complex with vitamin C  1 tablet Per Tube Daily  . chlorhexidine gluconate (MEDLINE KIT)  15 mL Mouth Rinse BID  . Chlorhexidine Gluconate Cloth  6 each Topical Q0600  . Chlorhexidine Gluconate Cloth  6 each Topical Q0600  . citalopram  10 mg Per Tube Daily  . collagenase   Topical Daily  . darbepoetin (ARANESP) injection - NON-DIALYSIS  100 mcg Subcutaneous Q Tue-1800  . docusate  200 mg Per Tube Daily  . feeding supplement (PRO-STAT SUGAR FREE 64)  30 mL Per Tube BID  . feeding supplement (VITAL 1.5 CAL)  1,000 mL Per Tube Q24H  . fentaNYL  1 patch Transdermal Q72H  . fludrocortisone  0.2 mg Oral Daily  . gabapentin  100 mg Oral QHS  . insulin aspart  0-6 Units Subcutaneous Q4H  . mouth rinse  15 mL Mouth Rinse 10 times per day  . melatonin  3 mg Per Tube QHS  . metoCLOPramide (REGLAN) injection  10 mg Intravenous Q6H  . midodrine  20 mg Per Tube Q8H  . pantoprazole sodium  40 mg Per Tube BID  . polyethylene glycol  17 g Oral Daily  . QUEtiapine  25 mg Oral QHS  . rOPINIRole  0.5 mg Oral QHS  . sennosides  5 mL Per Tube BID  . sodium chloride flush  10-40 mL Intracatheter Q12H  . sodium zirconium cyclosilicate  10 g Per Tube Daily  . sorbitol  15 mL Oral q morning - 10a    Jannifer Hick MD [facility] Pager [phone]

## 2019-06-10 NOTE — Progress Notes (Signed)
Patient ID: Brendan [name], male   DOB: 01/19/1955, 64 y.o.   MRN: [id number]     Advanced Heart Failure Rounding Note  PCP-Cardiologist: No primary care provider on file.   Subjective:    Back on full vent support due to worsening O2 sats.   Off CRRT x 2 days. No urine output. K now up. Weight climbing. Oxygenation worse.    BP in 70-80s transiently. Now at 48. On NE 4 and DBA 5. + midodrine  Resting on vent. Will awaken and interact  Co-ox back up to 59%   Objective:   Weight Range: 66.8 kg Body mass index is 20.54 kg/m.   Vital Signs:   Temp:  [97.6 F (36.4 C)-97.8 F (36.6 C)] 97.6 F (36.4 C) (05/25 0738) Pulse Rate:  [68-81] 74 (05/25 0930) Resp:  [7-28] 24 (05/25 0930) BP: (61-143)/(50-96) 82/51 (05/25 0930) SpO2:  [89 %-100 %] 92 % (05/25 0930) FiO2 (%):  [40 %] 40 % (05/25 0800) Weight:  [66.8 kg] 66.8 kg (05/25 0500) Last BM Date: 06/09/19  Weight change: Filed Weights   06/08/19 0500 06/09/19 0500 06/10/19 0500  Weight: 66 kg 65.7 kg 66.8 kg    Intake/Output:   Intake/Output Summary (Last 24 hours) at 06/10/2019 0940 Last data filed at 06/10/2019 0700 Gross per 24 hour  Intake 2096.33 ml  Output 20 ml  Net 2076.33 ml      Physical Exam   General:  In bed on vent. Weak appearing. No resp difficulty HEENT: normal Neck: supple. JVP up  + trach Carotids 2+ bilat; no bruits. No lymphadenopathy or thryomegaly appreciated. Cor: PMI nondisplaced. Regular rate & rhythm. No rubs, gallops or murmurs. Lungs: + crackles Abdomen: soft, nontender, nondistended. No hepatosplenomegaly. No bruits or masses. Good bowel sounds. Extremities: no cyanosis, clubbing, rash, 1+ sacral edema Neuro: awake on vent follows all commands and interacts    Telemetry   NSR 70s Personally reviewed   Labs    CBC Recent Labs    06/08/19 0254 06/09/19 0301  WBC 4.9 4.9  HGB 8.2* 8.0*  HCT 26.3* 26.0*  MCV 96.7 96.3  PLT 79* 88*   Basic Metabolic Panel  Recent Labs    06/09/19 0301 06/09/19 0837 06/09/19 1627 06/10/19 0330  NA 131*   < > 131* 129*  K 5.3*   < > 5.4* 5.7*  CL 100   < > 99 94*  CO2 26   < > 26 25  GLUCOSE 298*   < > 184* 236*  BUN 16   < > 25* 35*  CREATININE 1.36*   < > 1.73* 2.04*  CALCIUM 8.4*   < > 8.3* 8.5*  MG 2.3  --   --  2.5*  PHOS 2.1*  --  2.7 3.0   < > = values in this interval not displayed.   Liver Function Tests Recent Labs    06/09/19 1627 06/10/19 0330  ALBUMIN 2.2* 2.1*   No results for input(s): LIPASE, AMYLASE in the last 72 hours. Cardiac Enzymes No results for input(s): CKTOTAL, CKMB, CKMBINDEX, TROPONINI in the last 72 hours.  BNP: BNP (last 3 results) Recent Labs    04/01/2019 0113  BNP 655.7*    ProBNP (last 3 results) No results for input(s): PROBNP in the last 8760 hours.   D-Dimer No results for input(s): DDIMER in the last 72 hours. Hemoglobin A1C No results for input(s): HGBA1C in the last 72 hours. Fasting Lipid Panel No results for input(s):  CHOL, HDL, LDLCALC, TRIG, CHOLHDL, LDLDIRECT in the last 72 hours. Thyroid Function Tests No results for input(s): TSH, T4TOTAL, T3FREE, THYROIDAB in the last 72 hours.  Invalid input(s): FREET3  Other results:   Imaging    No results found.   Medications:     Scheduled Medications: . aspirin EC  325 mg Oral Daily   Or  . aspirin  324 mg Per Tube Daily  . B-complex with vitamin C  1 tablet Per Tube Daily  . chlorhexidine gluconate (MEDLINE KIT)  15 mL Mouth Rinse BID  . Chlorhexidine Gluconate Cloth  6 each Topical Q0600  . Chlorhexidine Gluconate Cloth  6 each Topical Q0600  . citalopram  10 mg Per Tube Daily  . collagenase   Topical Daily  . darbepoetin (ARANESP) injection - NON-DIALYSIS  100 mcg Subcutaneous Q Tue-1800  . docusate  200 mg Per Tube Daily  . feeding supplement (PRO-STAT SUGAR FREE 64)  30 mL Per Tube BID  . feeding supplement (VITAL 1.5 CAL)  1,000 mL Per Tube Q24H  . fentaNYL  1 patch  Transdermal Q72H  . fludrocortisone  0.2 mg Oral Daily  . gabapentin  100 mg Oral QHS  . insulin aspart  0-6 Units Subcutaneous Q4H  . mouth rinse  15 mL Mouth Rinse 10 times per day  . melatonin  3 mg Per Tube QHS  . metoCLOPramide (REGLAN) injection  10 mg Intravenous Q6H  . midodrine  20 mg Per Tube Q8H  . pantoprazole sodium  40 mg Per Tube BID  . polyethylene glycol  17 g Oral Daily  . QUEtiapine  25 mg Oral QHS  . rOPINIRole  0.5 mg Oral QHS  . sennosides  5 mL Per Tube BID  . sodium chloride flush  10-40 mL Intracatheter Q12H  . sorbitol  15 mL Oral q morning - 10a    Infusions: .  prismasol BGK 4/2.5 500 mL/hr at 06/08/19 0756  .  prismasol BGK 4/2.5 300 mL/hr at 06/08/19 0404  . sodium chloride 10 mL/hr at 06/02/19 1500  . sodium chloride Stopped (05/31/19 0958)  . dextrose 30 mL/hr at 06/10/19 0700  . dextrose 5 % and 0.9% NaCl Stopped (06/06/19 0513)  . DOBUTamine 5 mcg/kg/min (06/10/19 0700)  . furosemide    . heparin    . norepinephrine (LEVOPHED) Adult infusion 4 mcg/min (06/10/19 0700)  . prismasol BGK 4/2.5 1,000 mL/hr at 06/08/19 0603    PRN Medications: sodium chloride, alum & mag hydroxide-simeth, dextrose, diphenhydrAMINE, fentaNYL (SUBLIMAZE) injection, Gerhardt's butt cream, heparin, heparin, levalbuterol, ondansetron (ZOFRAN) IV, pneumococcal 23 valent vaccine, sodium chloride flush, traMADol     Assessment/Plan   1. Acute systolic HF ->  Cardiogenic Shock  - Due to iCM. EF 20-25% - Impella 5.5 placed on 4/2.   - s/p CABG/MVRepair on 4/6  - Impella out 4/9.  - Echo 4/10 with EF 25-30%, normal RV, no MR.  - Echo 4/22 EF 30-35% mildly reduced RV MVR ok   - Dobutamine added 5/14 for low co-ox 39% and inability to wean NE.  - Dobutamine increased to 5 on 5/21 due to low BP.Remains on NE at 4, midodrine at 20 tid and florinef to support BP.  - Co-ox back up to 59% on dual pressors. Likely improved as volume now back up - Repeat echo 05/29/19. EF  35-40% RV ok.  - He continues to struggle with shock and hypotension despite full support. I am increasingly concerned that we will not  be able to liberate him from the IV drips. - We had family meeting with his wife and daughter yesterday and they clearly understand how ill he is but are also hoping for him to find a way out of this in next week or two. His grandson is graduating HS on 6/2 so we will work aggressively to tune him up as much as possible for that (including continuing pressors and CRRT) and arrange video visit. If still no progress after that, we will meet again to reassess goals and next steps for him    2. 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 Echo stable - No s/s ischemia - On ASA/Crestor  3. Acute hypoxic respiratory failure - Due to pulmonary edema and PNA - Extubated 4/9 re-intubated on 4/12. Failed re-extubation on 4/14. Now s/p tracheostomy - S/P Bronch 4/27 - 60K yeast. -> ? Colonization.  - CT scan 5/7 with diffuse bilateral infiltrates c/w ARDS and large bilateral effusions R>L.  - s/p R chest tube. No significant drainage - Remains on vent FiO2 40% - CCM following.  - CXR and exam with worsening edema. Now back on vent support. Will need HD for volume removal. Likely CRRT  4. Mitral Regurgitation - Mod-severe on ECHO - s/p MV repair, TEE 4/9 with minimal MR s/p repair.   - Echo 4/10 with no significant MR.  - echo 4/22 MVR stable - Repeat echo 05/29/19. EF 35-40% RV ok.  MVR ok  5. Uncontrolled DM - Hgb A1C 9.  - On insulin and sliding scale.  - No change  6. AKI - due to shock - Remains anuric post-op - Continue CVVHD per Nephrology. Plan to stop tomorrow and give him a break to see if kidneys will respond.  - This remains the main issue currently. Unable to wean to iHD due to pressor dependence. It has now been > 4 weeks. We have tried many options to get him of pressors including blood transfusions, albumin, letting  fluid re-accumulate, midodrine at 20 tid but remain unsuccessful. - Off HD x 2 days. Remains anuric Will need HD today for volume and K removal   7. Acute blood loss Anemia  - Transfused 1UPRBCs 5/9/21and 5/14, 5/17 and 5/19  - hgb 8.2 today.  - transfuse hgb <= 7.5   8. Polymorphic VT - Recurrent VT during respiratory distress 4/14, - Off amio  - No further VT - Keep K> 4.0 Mg > 2.0   9. Severe Malnutrition - Prealbumin improved, up from 8.9>>18.7 - Nutrition on board - Continue w/ TFs  10. Atrial fibrillation. paroxysmal - remains in NSR today. No change - Amiodarone was stopped.   11. Debility, severe - Continue PT/OT  12. Chronic pain issues - appears comfortable - having severe constipation. CCM managing  - On Oxy q4 PRN. Titrate down as tolerated as this has been causing severe constipation  13. Full code  Plan as above. - We had family meeting with his wife and daughter yesterday and they clearly understand how ill he is but are also hoping for him to find a way out of this in next week or two. His grandson is graduating HS on 6/2 so we will work aggressively to tune him up as much as possible for that (including continuing pressors and CRRT) and arrange video visit. If still no progress after that, we will meet again to reassess goals and next steps for him  D/w Dr. Prescott Gum at bedside.    CRITICAL CARE Performed by: Glori Bickers  Total critical care time: 35 minutes  Critical care time was exclusive of separately billable procedures and treating other patients.  Critical care was necessary to treat or prevent imminent or life-threatening deterioration.  Critical care was time spent personally by me (independent of midlevel providers or residents) on the following activities: development of treatment plan with patient and/or surrogate as well as nursing, discussions with consultants, evaluation of patient's response to treatment, examination of patient,  obtaining history from patient or surrogate, ordering and performing treatments and interventions, ordering and review of laboratory studies, ordering and review of radiographic studies, pulse oximetry and re-evaluation of patient's condition.    Glori Bickers, MD  9:40 AM

## 2019-06-10 NOTE — Progress Notes (Signed)
39 Days Post-Op Procedure(s) (LRB): VIDEO BRONCHOSCOPY USING DISPOSABLE ANESTHESIA SCOPE (N/A) TRACHEOSTOMY (N/A) Subjective: Resting on vent- O2 sats drifting down w/o dialysis 48 Hrs nsr TF at goal  Objective: Vital signs in last 24 hours: Temp:  [97.6 F (36.4 C)-97.8 F (36.6 C)] 97.6 F (36.4 C) (05/25 0738) Pulse Rate:  [68-81] 77 (05/25 0738) Cardiac Rhythm: Normal sinus rhythm (05/25 0400) Resp:  [7-28] 23 (05/25 0738) BP: (61-143)/(50-96) 108/60 (05/25 0738) SpO2:  [89 %-100 %] 99 % (05/25 0745) FiO2 (%):  [40 %] 40 % (05/25 0745) Weight:  [66.8 kg] 66.8 kg (05/25 0500)  Hemodynamic parameters for last 24 hours: CVP:  [2 mmHg-9 mmHg] 8 mmHg  Intake/Output from previous day: 05/24 0701 - 05/25 0700 In: 2214.4 [I.V.:909.4; NG/GT:1305] Out: 20 [Chest Tube:20] Intake/Output this shift: No intake/output data recorded.       Exam    General- sedated and comfortable    Neck- no JVD, no cervical adenopathy palpable, no carotid bruit   Lungs-diminished   Cor- regular rate and rhythm, no murmur , gallop   Abdomen- soft, non-tender   Extremities - warm, non-tender, minimal edema   Neuro- oriented, appropriate, no focal weakness   Lab Results: Recent Labs    06/08/19 0254 06/09/19 0301  WBC 4.9 4.9  HGB 8.2* 8.0*  HCT 26.3* 26.0*  PLT 79* 88*   BMET:  Recent Labs    06/09/19 1627 06/10/19 0330  NA 131* 129*  K 5.4* 5.7*  CL 99 94*  CO2 26 25  GLUCOSE 184* 236*  BUN 25* 35*  CREATININE 1.73* 2.04*  CALCIUM 8.3* 8.5*    PT/INR: No results for input(s): LABPROT, INR in the last 72 hours. ABG    Component Value Date/Time   PHART 7.426 06/02/2019 0443   HCO3 26.3 06/02/2019 0443   TCO2 27 06/02/2019 0443   ACIDBASEDEF 3.0 (H) 04/28/2019 0735   O2SAT 59.5 06/10/2019 0740   CBG (last 3)  Recent Labs    06/09/19 2359 06/10/19 0332 06/10/19 0736  GLUCAP 208* 177* 98    Assessment/Plan: S/P Procedure(s) (LRB): VIDEO BRONCHOSCOPY USING  DISPOSABLE ANESTHESIA SCOPE (N/A) TRACHEOSTOMY (N/A) Iv lasix dose to challenge kidneys HD today to clear K+ Cont full support until family event in June per patient wishes  LOS: 39 days    [name] 06/10/2019

## 2019-06-10 NOTE — Progress Notes (Signed)
Palliative:  I went by room x 2 today but no family at bedside. Brendan [name] is more lethargic today and did not tolerate intermittent dialysis due to hypotension. I will follow up tomorrow for continued support to Brendan [name] and family.   No charge  Vinie Sill, NP Palliative Medicine Team Pager [phone] (Please see [email or web address] for schedule) Team Phone [phone]

## 2019-06-10 NOTE — Progress Notes (Signed)
Rt Note: RT called to room by nurse to place pt back on full support due to increased WOB and sats dropping into the 80's. Pt weaned on PSV 8/5 for 3 hrs. RT to continue to monitor.

## 2019-06-10 NOTE — Progress Notes (Signed)
NAME:  Brendan [name], MRN:  [id number], DOB:  December 31, 1955, LOS: 80 ADMISSION DATE:  03/22/2019, CONSULTATION DATE:  04/17/19 REFERRING MD:  Dr. Haroldine Laws, CHIEF COMPLAINT:  Respiratory distress   Brief History   30 yoM originally presented with SOB and fatigue at [facility] found to have new HFrEF, NSTEMI, and bilateral pleural effusions transferred to [facility] on 3/29 for further cardiac evaluation. 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 Started on lasix and milrinone gtts however developed NSVT. Was taken 4/1 for placement of IABP and swan for optimization prior to CABG. Was on precedex for agitation/ confusion, some concern for DTs. On the evening of 4/1, patient developed worsening respiratory distress and hypoxia, PCCM consulted for intubation and vent management.  Patient was extubated on 4/14 and developed worsening hypoxia on BiPAP. Overnight he developed pulseless VT requiring 1 minute of CPR and epinephrine to achieve ROSC. Neurovascularly intact afterward. Subsequently had increased work of breathing and worsening respiratory distress requiring intubation. Patient had tracheostomy performed on 04/24/2019. Trying to wean off ventilator.   Past Medical History  Tobacco abuse, HTN, poorly controlled diabetes, diabetic neuropathy  Significant Hospital Events   3/30 Lt heart cath/TEE performed 4/1 Intubated, RHC/IABP/PA cath 4/2 Impella placement 4/3 febrile over evening and night. Blood cultures obtained. Vanc/cefepime started. Hematuria--heparin held. 4/4 Extubated,abx transitioned to rocephin. Heparin restarted 4/6 Re-intubated for CABG/MVR 4/7 Extubated from CABG to BiPAP 4/9 Impella removed  4/9 TEE 4/10 CVC inserted &CRRT initiated  4/12 PCCMre-consulted re-intubation 4/14 Extubated to BiPAP 4/15 Re-intubated due to respiratory distress 4/16 Tracheostomy 4/27 bronch with BAL 5/7 called back for hypercarbic failure. #6 cuffed placed. Right chest tube placed for enlarging  effusion. 5/15-18 remains on vent, CVVHD  Consults:  TCTS PCCM HF Nephrology  Procedures:  R cephalic double lumen PICC LIJ tunneled HD catheter L brachial arterial line 6-0 cuffed shiley  Significant Diagnostic Tests:  3/30 R/ LHC >>  Ost LM to Mid LM lesion is 35% stenosed.  Prox LAD lesion is 90% stenosed.  Prox LAD to Mid LAD lesion is 50% stenosed.  Mid Cx lesion is 100% stenosed.  Prox RCA to Mid RCA lesion is 90% stenosed.  RPDA lesion is 95% stenosed.  LV end diastolic pressure is severely elevated.  Hemodynamic findings consistent with moderate pulmonary hypertension. 1. Severe 3 vessel obstructive CAD 2. High LV filling pressures 3. Reduced cardiac output with index 2.3 4. Moderate pulmonary HTN.  3/30TTE >> 1. Left ventricular ejection fraction, by estimation, is 30 to 35%. The left ventricle has moderately decreased function. The left ventricle demonstrates regional wall motion abnormalities.There is mild left ventricular hypertrophy. Left ventricular diastolic function could not be evaluated. There is severe hypokinesis of the left ventricular, entire inferior wall, inferoseptal wall, apical segment and lateral wall.  2. Right ventricular systolic function is hyperdynamic. The right ventricular size is normal. There is moderately elevated pulmonary artery systolic pressure.  3. Decreased posterior leaflet motion due to ischemic tethering of the mitral valve leaflets.  4. The mitral valve is abnormal. Moderate to severe mitral valve regurgitation.  5. The aortic valve is tricuspid. Aortic valve regurgitation is not visualized. Mild aortic valve sclerosis is present, with no evidence of aortic valve stenosis.  6. The inferior vena cava is normal in size with greater than 50% respiratory variability, suggesting right atrial pressure of 3 mmHg.   4/1 CT chest w/o >>Large bilateral pleural effusions with associated atelectasis.Moderate to severe  bilateral ground-glass opacities, likely reflecting pneumonia and/or edema.  4/1 RHC >> RA = 18 RV = 60/20  PA = 63/29 (42) PCW = 33 Fick cardiac output/index = 3.7/1.9 PVR = 2.5 WU FA sat = 98% PA sat = 47%, 49% PaPi = 2.2   Micro Data:  5/7 R pleural fluid neg 5/7 blood neg to date 5/6 tracheal aspirate enterobacter  Antimicrobials:    Interim history/subjective:  No events. iHD trial planned today. Intermittent chest pain continues but overall slowly improving.  Objective   Blood pressure 108/60, pulse 77, temperature 97.6 F (36.4 C), temperature source Axillary, resp. rate (!) 23, height _0  (1.803 m), weight 66.8 kg, SpO2 99 %. CVP:  [2 mmHg-9 mmHg] 8 mmHg  Vent Mode: PSV;CPAP FiO2 (%):  [40 %] 40 % Set Rate:  [24 bmp-25 bmp] 24 bmp Vt Set:  [600 mL] 600 mL PEEP:  [5 cmH20] 5 cmH20 Pressure Support:  [14 cmH20] 14 cmH20 Plateau Pressure:  [34 cmH20] 34 cmH20   Intake/Output Summary (Last 24 hours) at 06/10/2019 0843 Last data filed at 06/10/2019 0700 Gross per 24 hour  Intake 2178.06 ml  Output 20 ml  Net 2158.06 ml   Filed Weights   06/08/19 0500 06/09/19 0500 06/10/19 0500  Weight: 66 kg 65.7 kg 66.8 kg    Examination:   GEN: Cachectic elderly debilitated gentleman, on mechanical ventilator HEENT: Tracheostomy tube in place, clean dry and intact CV: Regular rate rhythm, S1-S2 PULM: Coarse bilateral ventilated breath sounds GI: Soft nontender nondistended, +BS EXT: No significant edema, + muscle wasting NEURO: awake and following commands PSYCH: RASS 0 SKIN: Multiple areas of skin breakdown   K up a bit more CBC pending  Resolved Problems   Agitated delirium- improved - seroquel qHS, continue melatonin - continue PRN oxycodone - re-orient, encourage day/night cycles  Assessment & Plan:   Acute on chronic hypoxemic and hypercarbic respiratory failure after CABG now s/p trach. Multifactorial etiology. Treated Enterobacter pneumonia  s/p antibiotics. Persistent diffuse interstitial and airspace process on CXR 5/18 in setting of CHF and severe protein calorie malnutrition manifested by resultant pulmonary edema and pleural effusions. Plan: -TC as able -Chest tube management per primary  Shock- likely multifactorial etiology in setting of acute systolic CHF 2/2 iCM (most recent 5/13 EF 35-40%) s/p CABG/MVR 4/6 and cardiogenic shock. Most likely also has distributive component in setting of protein calorie malnutrition resulting in low oncotic pressure -> significant third spacing.  -Continue dobutamine, florinef, levophed, midodrine  ESRD: oliguric/anuric AKI in setting of multifactorial shock as described above. CRRT started on 4/10 and stopped 4/29 with failed attempt to transition to iHD on 5/1. Now back on CRRT on 5/7 2/2 acute worsening of oxygenation and hypotension. He currently remains anuric. -iHD trial today  Constipation/colonic ileus - Resolved, sorbitol to PRN - Continue senna and miralax  Dysphagia- -Likely need PEG tube at some point  Hypoglycemia, resolved -Continue tube feeds, continue to monitor  Profound muscular deconditioning -PT OT  Pancytopenia- stable, monitor  Goals of care-continue current level of care  Best practice:  Diet: Tube feeds Pain/Anxiety/Delirium protocol (if indicated): see above VAP protocol (if indicated):HOB 30 degrees  DVT prophylaxis: ASA 369m, SCDs GI prophylaxis: PPI Glucose control:SSI Mobility: BR Code Status: Full  Family Communication:per primary team Disposition:ICU pending CRRT and vent liberation  DErskine EmeryMD PCCM

## 2019-06-11 ENCOUNTER — Inpatient Hospital Stay (HOSPITAL_COMMUNITY): Payer: Medicaid Other

## 2019-06-11 LAB — GLUCOSE, CAPILLARY
Glucose-Capillary: 109 mg/dL — ABNORMAL HIGH (ref 70–99)
Glucose-Capillary: 113 mg/dL — ABNORMAL HIGH (ref 70–99)
Glucose-Capillary: 120 mg/dL — ABNORMAL HIGH (ref 70–99)
Glucose-Capillary: 156 mg/dL — ABNORMAL HIGH (ref 70–99)
Glucose-Capillary: 176 mg/dL — ABNORMAL HIGH (ref 70–99)
Glucose-Capillary: 187 mg/dL — ABNORMAL HIGH (ref 70–99)

## 2019-06-11 LAB — RENAL FUNCTION PANEL
Albumin: 2.2 g/dL — ABNORMAL LOW (ref 3.5–5.0)
Albumin: 2.2 g/dL — ABNORMAL LOW (ref 3.5–5.0)
Anion gap: 8 (ref 5–15)
Anion gap: 6 (ref 5–15)
BUN: 42 mg/dL — ABNORMAL HIGH (ref 8–23)
BUN: 36 mg/dL — ABNORMAL HIGH (ref 8–23)
CO2: 25 mmol/L (ref 22–32)
CO2: 26 mmol/L (ref 22–32)
Calcium: 8.5 mg/dL — ABNORMAL LOW (ref 8.9–10.3)
Calcium: 8.1 mg/dL — ABNORMAL LOW (ref 8.9–10.3)
Chloride: 93 mmol/L — ABNORMAL LOW (ref 98–111)
Chloride: 96 mmol/L — ABNORMAL LOW (ref 98–111)
Creatinine, Ser: 2.5 mg/dL — ABNORMAL HIGH (ref 0.61–1.24)
Creatinine, Ser: 1.96 mg/dL — ABNORMAL HIGH (ref 0.61–1.24)
GFR calc Af Amer: 31 mL/min — ABNORMAL LOW (ref >60)
GFR calc Af Amer: 42 mL/min — ABNORMAL LOW (ref >60)
GFR calc non Af Amer: 27 mL/min — ABNORMAL LOW (ref >60)
GFR calc non Af Amer: 36 mL/min — ABNORMAL LOW (ref >60)
Glucose, Bld: 267 mg/dL — ABNORMAL HIGH (ref 70–99)
Glucose, Bld: 198 mg/dL — ABNORMAL HIGH (ref 70–99)
Phosphorus: 3.6 mg/dL (ref 2.5–4.6)
Phosphorus: 3.3 mg/dL (ref 2.5–4.6)
Potassium: 5.8 mmol/L — ABNORMAL HIGH (ref 3.5–5.1)
Potassium: 5.6 mmol/L — ABNORMAL HIGH (ref 3.5–5.1)
Sodium: 126 mmol/L — ABNORMAL LOW (ref 135–145)
Sodium: 128 mmol/L — ABNORMAL LOW (ref 135–145)

## 2019-06-11 LAB — POCT I-STAT 7, (LYTES, BLD GAS, ICA,H+H)
Acid-Base Excess: 2 mmol/L (ref 0.0–2.0)
Bicarbonate: 28.1 mmol/L — ABNORMAL HIGH (ref 20.0–28.0)
Calcium, Ion: 1.21 mmol/L (ref 1.15–1.40)
HCT: 25 % — ABNORMAL LOW (ref 39.0–52.0)
Hemoglobin: 8.5 g/dL — ABNORMAL LOW (ref 13.0–17.0)
O2 Saturation: 96 %
Patient temperature: 97.9
Potassium: 6 mmol/L — ABNORMAL HIGH (ref 3.5–5.1)
Sodium: 128 mmol/L — ABNORMAL LOW (ref 135–145)
TCO2: 30 mmol/L (ref 22–32)
pCO2 arterial: 50.1 mmHg — ABNORMAL HIGH (ref 32.0–48.0)
pH, Arterial: 7.355 (ref 7.350–7.450)
pO2, Arterial: 88 mmHg (ref 83.0–108.0)

## 2019-06-11 LAB — CBC
HCT: 25.3 % — ABNORMAL LOW (ref 39.0–52.0)
Hemoglobin: 7.8 g/dL — ABNORMAL LOW (ref 13.0–17.0)
MCH: 29.5 pg (ref 26.0–34.0)
MCHC: 30.8 g/dL (ref 30.0–36.0)
MCV: 95.8 fL (ref 80.0–100.0)
Platelets: 114 10*3/uL — ABNORMAL LOW (ref 150–400)
RBC: 2.64 MIL/uL — ABNORMAL LOW (ref 4.22–5.81)
RDW: 15.8 % — ABNORMAL HIGH (ref 11.5–15.5)
WBC: 4.8 10*3/uL (ref 4.0–10.5)
nRBC: 0 % (ref 0.0–0.2)

## 2019-06-11 LAB — MAGNESIUM: Magnesium: 2.5 mg/dL — ABNORMAL HIGH (ref 1.7–2.4)

## 2019-06-11 LAB — FUNGUS CULTURE RESULT

## 2019-06-11 LAB — FUNGAL ORGANISM REFLEX

## 2019-06-11 LAB — FUNGUS CULTURE WITH STAIN

## 2019-06-11 LAB — COOXEMETRY PANEL
Carboxyhemoglobin: 1.4 % (ref 0.5–1.5)
Methemoglobin: 0.9 % (ref 0.0–1.5)
O2 Saturation: 63.8 %
Total hemoglobin: 7.8 g/dL — ABNORMAL LOW (ref 12.0–16.0)

## 2019-06-11 MED ORDER — DARBEPOETIN ALFA 150 MCG/0.3ML IJ SOSY
150.0000 ug | PREFILLED_SYRINGE | INTRAMUSCULAR | Status: DC
  Administered 2019-06-17: 150 ug via SUBCUTANEOUS
  Filled 2019-06-11: qty 0.3

## 2019-06-11 MED ORDER — SODIUM ZIRCONIUM CYCLOSILICATE 10 G PO PACK: 10.0000 g | PACK | Freq: Every day | ORAL | Status: DC

## 2019-06-11 MED ORDER — JUVEN PO PACK
1.0000 | PACK | Freq: Two times a day (BID) | ORAL | Status: DC
  Administered 2019-06-11 – 2019-06-20 (×19): 1
  Filled 2019-06-11 (×21): qty 1

## 2019-06-11 MED ORDER — SODIUM ZIRCONIUM CYCLOSILICATE 10 G PO PACK
10.0000 g | PACK | Freq: Every day | ORAL | Status: AC
  Administered 2019-06-11: 10 g
  Filled 2019-06-11 (×2): qty 1

## 2019-06-11 MED ORDER — HEPARIN (PORCINE) 2000 UNITS/L FOR CRRT
INTRAVENOUS_CENTRAL | Status: DC | PRN
  Administered 2019-06-19: 2000 mL via INTRAVENOUS_CENTRAL
  Filled 2019-06-11 (×6): qty 1000

## 2019-06-11 NOTE — Progress Notes (Addendum)
Patient ID: Brendan [name], male   DOB: Oct 10, 1955, 64 y.o.   MRN: [id number]     Advanced Heart Failure Rounding Note  PCP-Cardiologist: No primary care provider on file.   Subjective:    Back on full vent  40%   On 5/25 did not tolerate HD due to hypotension. On norepi  5 mcg + dobutaimne 5 mcg.   Co-ox 64%   Drowsy.     Objective:   Weight Range: 69.3 kg Body mass index is 21.31 kg/m.   Vital Signs:   Temp:  [97.7 F (36.5 C)-98 F (36.7 C)] 97.9 F (36.6 C) (05/26 0742) Pulse Rate:  [73-87] 80 (05/26 0800) Resp:  [15-30] 27 (05/26 0800) BP: (64-172)/(35-151) 92/60 (05/26 0800) SpO2:  [76 %-100 %] 100 % (05/26 0800) FiO2 (%):  [40 %] 40 % (05/26 0800) Weight:  [67.1 kg-69.3 kg] 69.3 kg (05/26 0445) Last BM Date: 06/11/19  Weight change: Filed Weights   06/10/19 1127 06/10/19 1215 06/11/19 0445  Weight: 67.1 kg 67.3 kg 69.3 kg    Intake/Output:   Intake/Output Summary (Last 24 hours) at 06/11/2019 0850 Last data filed at 06/11/2019 0800 Gross per 24 hour  Intake 1955.56 ml  Output 50 ml  Net 1905.56 ml      Physical Exam  CVP 15  General:  Thin.  vent. Drowsy HEENT: normal Neck: trach, supple. JVP to jaw  Carotids 2+ bilat; no bruits. No lymphadenopathy or thryomegaly appreciated. LIJ HD catheter Cor: PMI nondisplaced. Regular rate & rhythm. No rubs, gallops or murmurs. Lungs: Crackles throughout.  CT on right  Abdomen: soft, nontender, nondistended. No hepatosplenomegaly. No bruits or masses. Good bowel sounds. Extremities: no cyanosis, clubbing, rash, R and LLE 2 edema Neuro: opens eyes. MAE x4    Telemetry   SR 70s    Labs    CBC Recent Labs    06/10/19 0901 06/11/19 0330  WBC 5.8 4.8  HGB 8.2* 7.8*  HCT 27.0* 25.3*  MCV 96.4 95.8  PLT 112* 497*   Basic Metabolic Panel Recent Labs    06/10/19 0330 06/10/19 0330 06/10/19 1730 06/11/19 0330  NA 129*   < > 127* 126*  K 5.7*   < > 5.4* 5.8*  CL 94*   < > 93* 93*  CO2 25   <  > 26 25  GLUCOSE 236*   < > 283* 267*  BUN 35*   < > 37* 42*  CREATININE 2.04*   < > 2.22* 2.50*  CALCIUM 8.5*   < > 8.5* 8.5*  MG 2.5*  --   --  2.5*  PHOS 3.0   < > 3.3 3.6   < > = values in this interval not displayed.   Liver Function Tests Recent Labs    06/10/19 1730 06/11/19 0330  ALBUMIN 2.2* 2.2*   No results for input(s): LIPASE, AMYLASE in the last 72 hours. Cardiac Enzymes No results for input(s): CKTOTAL, CKMB, CKMBINDEX, TROPONINI in the last 72 hours.  BNP: BNP (last 3 results) Recent Labs    03/23/2019 0113  BNP 655.7*    ProBNP (last 3 results) No results for input(s): PROBNP in the last 8760 hours.   D-Dimer No results for input(s): DDIMER in the last 72 hours. Hemoglobin A1C No results for input(s): HGBA1C in the last 72 hours. Fasting Lipid Panel No results for input(s): CHOL, HDL, LDLCALC, TRIG, CHOLHDL, LDLDIRECT in the last 72 hours. Thyroid Function Tests No results for input(s): TSH, T4TOTAL,  T3FREE, THYROIDAB in the last 72 hours.  Invalid input(s): FREET3  Other results:   Imaging    No results found.   Medications:     Scheduled Medications: . aspirin EC  325 mg Oral Daily   Or  . aspirin  324 mg Per Tube Daily  . B-complex with vitamin C  1 tablet Per Tube Daily  . chlorhexidine gluconate (MEDLINE KIT)  15 mL Mouth Rinse BID  . citalopram  10 mg Per Tube Daily  . collagenase   Topical Daily  . darbepoetin (ARANESP) injection - NON-DIALYSIS  100 mcg Subcutaneous Q Tue-1800  . docusate  200 mg Per Tube Daily  . feeding supplement (PRO-STAT SUGAR FREE 64)  30 mL Per Tube BID  . feeding supplement (VITAL 1.5 CAL)  1,000 mL Per Tube Q24H  . fentaNYL  1 patch Transdermal Q72H  . fludrocortisone  0.2 mg Oral Daily  . gabapentin  100 mg Oral QHS  . insulin aspart  0-6 Units Subcutaneous Q4H  . mouth rinse  15 mL Mouth Rinse 10 times per day  . melatonin  3 mg Per Tube QHS  . metoCLOPramide (REGLAN) injection  10 mg  Intravenous Q6H  . midodrine  20 mg Per Tube Q8H  . pantoprazole sodium  40 mg Per Tube BID  . polyethylene glycol  17 g Oral Daily  . QUEtiapine  25 mg Oral QHS  . rOPINIRole  0.5 mg Oral QHS  . sennosides  5 mL Per Tube BID  . sodium chloride flush  10-40 mL Intracatheter Q12H  . sodium zirconium cyclosilicate  10 g Per Tube Daily  . sorbitol  15 mL Oral q morning - 10a    Infusions: .  prismasol BGK 4/2.5 500 mL/hr at 06/11/19 0383  .  prismasol BGK 4/2.5 300 mL/hr at 06/11/19 3383  . sodium chloride 250 mL (06/10/19 1053)  . sodium chloride Stopped (05/31/19 0958)  . dextrose 30 mL/hr at 06/11/19 0800  . dextrose 5 % and 0.9% NaCl Stopped (06/06/19 0513)  . DOBUTamine 5 mcg/kg/min (06/11/19 0800)  . heparin 999 mL/hr at 06/11/19 0836  . norepinephrine (LEVOPHED) Adult infusion 5 mcg/min (06/11/19 0800)  . prismasol BGK 4/2.5 1,000 mL/hr at 06/11/19 0820    PRN Medications: sodium chloride, alum & mag hydroxide-simeth, dextrose, diphenhydrAMINE, fentaNYL (SUBLIMAZE) injection, Gerhardt's butt cream, heparin, heparin, heparin, levalbuterol, ondansetron (ZOFRAN) IV, pneumococcal 23 valent vaccine, sodium chloride flush, traMADol     Assessment/Plan   1. Acute systolic HF ->  Cardiogenic Shock  - Due to iCM. EF 20-25% - Impella 5.5 placed on 4/2.   - s/p CABG/MVRepair on 4/6  - Impella out 4/9.  - Echo 4/10 with EF 25-30%, normal RV, no MR.  - Echo 4/22 EF 30-35% mildly reduced RV MVR ok   - Dobutamine added 5/14 for low co-ox 39% and inability to wean NE.  - CO-OX stable 64%. CVP up 15 -- Dobutamine increased to 5 on 5/21 due to low BP and norepi up to 6 mcg. .Remains on midodrine at 20 tid and florinef to support BP.  - Repeat echo 05/29/19. EF 35-40% RV ok.   2. 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 Echo stable - No s/s ischemia - On ASA/Crestor  3. Acute hypoxic respiratory failure - Due to pulmonary edema and PNA - Extubated 4/9  re-intubated on 4/12. Failed re-extubation on 4/14. Now s/p tracheostomy - S/P  Bronch 4/27 - 60K yeast. -> ? Colonization.  - CT scan 5/7 with diffuse bilateral infiltrates c/w ARDS and large bilateral effusions R>L.  - s/p R chest tube. No output.  - Remains on vent FiO2 40% - CCM following.   4. Mitral Regurgitation - Mod-severe on ECHO - s/p MV repair, TEE 4/9 with minimal MR s/p repair.   - Echo 4/10 with no significant MR.  - echo 4/22 MVR stable - Repeat echo 05/29/19. EF 35-40% RV ok.  MVR ok  5. Uncontrolled DM - Hgb A1C 9.  - On insulin and sliding scale.  - No change  6. AKI - due to shock - Remains anuric post-op - Continue CVVHD per Nephrology. Plan to stop tomorrow and give him a break to see if kidneys will respond.  - This remains the main issue currently. Unable to wean to iHD due to pressor dependence. It has now been > 4 weeks. We have tried many options to get him of pressors including blood transfusions, albumin, letting fluid re-accumulate, midodrine at 20 tid but remain unsuccessful. -Intolerant HD 5/25 due to hypotension.  - Restarting CVVHD today. K elevated   7. Acute blood loss Anemia  - Transfused 1UPRBCs 5/9/21and 5/14, 5/17 and 5/19  - hgb 7.8.  - transfuse hgb <= 7.5   8. Polymorphic VT - Recurrent VT during respiratory distress 4/14, - Off amio  - No further VT - Keep K> 4.0 Mg > 2.0   9. Severe Malnutrition - Prealbumin improved, up from 8.9>>18.7 - Nutrition on board - Continue w/ TFs  10. Atrial fibrillation. paroxysmal - Remains in NSR.  - Off amiodarone.    11. Debility, severe - Continue PT/OT  12. Chronic pain issues - Appears comfortable.  - having severe constipation. CCM managing   13. Hyponatremia  Sodium down to 126.   14. Full code   Amy Clegg  NP-C  8:50 AM  Agree with above.  Very tough night for him last night. Failed iHD due to hypotension. More lethargic and SOB. NE up to 6 currently. Remains lethargic  today but can follow commands.  General:  Weak appearing. Lethargic More SOB HEENT: normal Neck: supple. JVP to jaw. Carotids 2+ bilat; no bruits. No lymphadenopathy or thryomegaly appreciated. Cor: PMI nondisplaced. Regular rate & rhythm. No rubs, gallops or murmurs. Lungs: diffuse crackles and rhonchi Abdomen: soft, nontender, nondistended. No hepatosplenomegaly. No bruits or masses. Good bowel sounds. Extremities: no cyanosis, clubbing, rash, + scral edema Neuro: awake but lethargic. Follows commands  Significantly worse today. Will need to restart CRRT to remove fluid. Titrate pressors to whatever level needed to support BP. D/w Dr. Prescott Gum at bedside. Continue aggressive care.   CRITICAL CARE Performed by: Glori Bickers  Total critical care time: 35 minutes  Critical care time was exclusive of separately billable procedures and treating other patients.  Critical care was necessary to treat or prevent imminent or life-threatening deterioration.  Critical care was time spent personally by me (independent of midlevel providers or residents) on the following activities: development of treatment plan with patient and/or surrogate as well as nursing, discussions with consultants, evaluation of patient's response to treatment, examination of patient, obtaining history from patient or surrogate, ordering and performing treatments and interventions, ordering and review of laboratory studies, ordering and review of radiographic studies, pulse oximetry and re-evaluation of patient's condition.  Glori Bickers, MD  10:53 AM

## 2019-06-11 NOTE — Progress Notes (Signed)
NAME:  Brendan [name], MRN:  [id number], DOB:  [birth year], LOS: 85 ADMISSION DATE:  03/31/2019, CONSULTATION DATE:  04/17/19 REFERRING MD:  Dr. Haroldine Laws, CHIEF COMPLAINT:  Respiratory distress   Brief History   89 yoM originally presented with SOB and fatigue at [facility] found to have new HFrEF, NSTEMI, and bilateral pleural effusions transferred to [facility] on 3/29 for further cardiac evaluation. 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 Started on lasix and milrinone gtts however developed NSVT. Was taken 4/1 for placement of IABP and swan for optimization prior to CABG. Was on precedex for agitation/ confusion, some concern for DTs. On the evening of 4/1, patient developed worsening respiratory distress and hypoxia, PCCM consulted for intubation and vent management.  Patient was extubated on 4/14 and developed worsening hypoxia on BiPAP. Overnight he developed pulseless VT requiring 1 minute of CPR and epinephrine to achieve ROSC. Neurovascularly intact afterward. Subsequently had increased work of breathing and worsening respiratory distress requiring intubation. Patient had tracheostomy performed on 04/21/2019. Trying to wean off ventilator.   Past Medical History  Tobacco abuse, HTN, poorly controlled diabetes, diabetic neuropathy  Significant Hospital Events   3/30 Lt heart cath/TEE performed 4/1 Intubated, RHC/IABP/PA cath 4/2 Impella placement 4/3 febrile over evening and night. Blood cultures obtained. Vanc/cefepime started. Hematuria--heparin held. 4/4 Extubated,abx transitioned to rocephin. Heparin restarted 4/6 Re-intubated for CABG/MVR 4/7 Extubated from CABG to BiPAP 4/9 Impella removed  4/9 TEE 4/10 CVC inserted &CRRT initiated  4/12 PCCMre-consulted re-intubation 4/14 Extubated to BiPAP 4/15 Re-intubated due to respiratory distress 4/16 Tracheostomy 4/27 bronch with BAL 5/7 called back for hypercarbic failure. #6 cuffed placed. Right chest tube placed for enlarging  effusion. 5/15-18 remains on vent, CVVHD  Consults:  TCTS PCCM HF Nephrology  Procedures:  R cephalic double lumen PICC LIJ tunneled HD catheter L brachial arterial line 6-0 cuffed shiley  Significant Diagnostic Tests:  3/30 R/ LHC >>  Ost LM to Mid LM lesion is 35% stenosed.  Prox LAD lesion is 90% stenosed.  Prox LAD to Mid LAD lesion is 50% stenosed.  Mid Cx lesion is 100% stenosed.  Prox RCA to Mid RCA lesion is 90% stenosed.  RPDA lesion is 95% stenosed.  LV end diastolic pressure is severely elevated.  Hemodynamic findings consistent with moderate pulmonary hypertension. 1. Severe 3 vessel obstructive CAD 2. High LV filling pressures 3. Reduced cardiac output with index 2.3 4. Moderate pulmonary HTN.  3/30TTE >> 1. Left ventricular ejection fraction, by estimation, is 30 to 35%. The left ventricle has moderately decreased function. The left ventricle demonstrates regional wall motion abnormalities.There is mild left ventricular hypertrophy. Left ventricular diastolic function could not be evaluated. There is severe hypokinesis of the left ventricular, entire inferior wall, inferoseptal wall, apical segment and lateral wall.  2. Right ventricular systolic function is hyperdynamic. The right ventricular size is normal. There is moderately elevated pulmonary artery systolic pressure.  3. Decreased posterior leaflet motion due to ischemic tethering of the mitral valve leaflets.  4. The mitral valve is abnormal. Moderate to severe mitral valve regurgitation.  5. The aortic valve is tricuspid. Aortic valve regurgitation is not visualized. Mild aortic valve sclerosis is present, with no evidence of aortic valve stenosis.  6. The inferior vena cava is normal in size with greater than 50% respiratory variability, suggesting right atrial pressure of 3 mmHg.   4/1 CT chest w/o >>Large bilateral pleural effusions with associated atelectasis.Moderate to severe  bilateral ground-glass opacities, likely reflecting pneumonia and/or edema.  4/1 RHC >> RA = 18 RV = 60/20  PA = 63/29 (42) PCW = 33 Fick cardiac output/index = 3.7/1.9 PVR = 2.5 WU FA sat = 98% PA sat = 47%, 49% PaPi = 2.2   Micro Data:  5/7 R pleural fluid neg 5/7 blood neg to date 5/6 tracheal aspirate enterobacter  Antimicrobials:    Interim history/subjective:  Did not tolerate HD To start CRRT again More confused this AM and tachypneic  Objective   Blood pressure (!) 88/59, pulse 74, temperature 97.9 F (36.6 C), temperature source Oral, resp. rate (!) 27, height _0  (1.803 m), weight 69.3 kg, SpO2 95 %. CVP:  [5 mmHg-25 mmHg] 15 mmHg  Vent Mode: PRVC FiO2 (%):  [40 %] 40 % Set Rate:  [25 bmp] 25 bmp Vt Set:  [600 mL] 600 mL PEEP:  [5 cmH20] 5 cmH20 Plateau Pressure:  [29 cmH20-32 cmH20] 32 cmH20   Intake/Output Summary (Last 24 hours) at 06/11/2019 0951 Last data filed at 06/11/2019 0900 Gross per 24 hour  Intake 2762.97 ml  Output 50 ml  Net 2712.97 ml   Filed Weights   06/10/19 1127 06/10/19 1215 06/11/19 0445  Weight: 67.1 kg 67.3 kg 69.3 kg    Examination:   GEN: Cachectic elderly debilitated gentleman, on mechanical ventilator HEENT: Tracheostomy tube in place, clean dry and intact, thick tan secretions CV: Regular rate rhythm, S1-S2, ext warm PULM: Coarse bilateral ventilated breath sounds GI: Soft nontender nondistended, +BS, NGT in place EXT: No significant edema, + muscle wasting NEURO: awake, moves all 4 ext but not to command PSYCH: RASS 0 SKIN: Multiple areas of skin breakdown   Na low K up Net +2L yesterday CXR continued bilateral severe airspace disease, lines and tube in appropriate positions  Resolved Problems   Agitated delirium- improved - seroquel qHS, continue melatonin - continue PRN oxycodone - re-orient, encourage day/night cycles  Hypoglycemia, resolved -Continue tube feeds, continue to monitor  Assessment  & Plan:   Acute on chronic hypoxemic and hypercarbic respiratory failure after CABG now s/p trach. Multifactorial etiology. Treated Enterobacter pneumonia s/p antibiotics. Persistent diffuse interstitial and airspace process on CXR 5/18 in setting of CHF and severe protein calorie malnutrition manifested by resultant pulmonary edema and pleural effusions. Plan: -TC as able: will be limited today until get back on top of fluid status - Check ABG given confusion -Chest tube management per primary  Toxic metabolic encephalopathy- recheck after CRRT runs for a day or two  Shock- likely multifactorial etiology in setting of acute systolic CHF 2/2 iCM (most recent 5/13 EF 35-40%) s/p CABG/MVR 4/6 and cardiogenic shock. Most likely also has distributive component in setting of protein calorie malnutrition resulting in low oncotic pressure -> significant third spacing.  -Continue dobutamine, florinef, levophed, midodrine  ESRD: oliguric/anuric AKI in setting of multifactorial shock as described above. CRRT started on 4/10 and stopped 4/29 with failed attempt to transition to iHD on 5/1. Now back on CRRT on 5/7 2/2 acute worsening of oxygenation and hypotension. He currently remains anuric. -iHD trial today  Constipation/colonic ileus - Resolved, sorbitol to PRN - Continue senna and miralax  Dysphagia- -Likely need PEG tube at some point   Profound muscular deconditioning -PT OT  Pancytopenia- stable, monitor  Goals of care-continue current level of care  Best practice:  Diet: Tube feeds Pain/Anxiety/Delirium protocol (if indicated): see above VAP protocol (if indicated):HOB 30 degrees  DVT prophylaxis: ASA 342m, SCDs GI prophylaxis: PPI Glucose control:SSI Mobility: BR Code  Status: Full  Family Communication:per primary team Disposition:ICU pending CRRT and vent liberation  Erskine Emery MD PCCM

## 2019-06-11 NOTE — Progress Notes (Signed)
Nutrition Follow-up  DOCUMENTATION CODES:   Severe malnutrition in context of chronic illness, Underweight  INTERVENTION:   Tube Feeding:  Vital 1.5 at 60 ml/hr Pro-Stat 30 mL BID Provides 127 g of protein, 2360 kcals, 1094 mL of free water  Add Juven BID to promote wound healing, each packet provides 80 calories, 8 grams of carbohydrate, 2.5  grams of protein (collagen), 7 grams of L-arginine and 7 grams of L-glutamine; supplement contains CaHMB, Vitamins C, E, B12 and Zinc  Recommend considering PEG-J tube depending on GOC  Continue B-complex with C   NUTRITION DIAGNOSIS:   Severe Malnutrition related to chronic illness as evidenced by severe muscle depletion, severe fat depletion.  Being addressed via TF   GOAL:   Patient will meet greater than or equal to 90% of their needs  Progressing  MONITOR:   Vent status, Diet advancement, Labs, Weight trends, TF tolerance  REASON FOR ASSESSMENT:   Consult Poor PO, Assessment of nutrition requirement/status  ASSESSMENT:   64 yo male admitted with acute systeolic CHF, CAD with 3 vessel disease with plan for CABG. PMH includes DM with HgbA1c 9 with neuropathy, HTN  3/30 Cardiac Cath with severe 3-vessel CAD 3/31 ECHO: EF 30-35% 4/01 IABP placed, Intubated 4/02 Impella placed, bilateral CT placed for pleural effusions 4/04 Extubated 4/06 CABG, MV repair, Intubated 4/07 Extubated 4/09 Impella removed, Cortrak placed (gastric), TF initiated 4/10 CRRT initiated 4/12 Re-intubated 4/14 Extubated 4/15 Re-intubated 4/16 Trach, Bronchoscopy 4/27 Bronch 4/29 CRRT discontinued 4/30 off vent, TC x 24 hours 5/01 1st iHD 5/07 back on CRRT and vent support 5/19 Vomiting, TF held, Cortrak advanced to post pyloric 5/23 CRRT held 5/25 Trial of iHD not successful 5/26 CRRT restarted  Palliative care following pt; noted currently continuing full aggressive care with hopes of improvement. Goal is for patient to virtually  experience his grandson's high school graduation  Pt on vent support via trach, lethargic Pt remains on levophed and dobutamine. Pt did not tolerate iHD yesterday, back on CRRT  Tolerating Vital 1.5 at 60 ml/hr via post-pyloric Cortrak feeding tube, Pro-Stat 30 mL BID  D10 at 30 ml/hr due to hypoglycemia when TF held for nausea; consider holding while TF infusing  Potassium elevated today after being off CRRT for several days; Potassium should improve with initiation of CRRT  Pt with rectal tube in place. Pt remains on senokot, miralax, colace. Noted hx of constipation this admission but recommend considering reducing bowel regimen. Recommend continuing reglan for nausea and delayed gastric emptying  Current wt 69.3 kg (weight up after stopping CRRT on 5/23). Net +17 L per I/O flow sheet. Pt remains anuric. Lowest weight around 52 kg during admission; utilizing this as dry weight.   Labs: potassium 6.0 (H), CBGs 109-176, sodium 128 (L) Meds: ss novolog, reglan   Diet Order:   Diet Order    None      EDUCATION NEEDS:   Education needs have been addressed  Skin:  Skin Assessment: Skin Integrity Issues: Skin Integrity Issues:: Stage II, Stage III, Incisions DTI: Coccyx Stage II: perineum Stage III: coccyx Incisions: R axilla, chest, bilateral legs  Last BM:  5/21  Height:   Ht Readings from Last 1 Encounters:  04/30/2019 5\' 11"  (1.803 m)    Weight:   Wt Readings from Last 1 Encounters:  06/11/19 69.3 kg    BMI:  Body mass index is 21.31 kg/m.  Estimated Nutritional Needs:   Kcal:  2100-2300 kcals  Protein:  120-135 g  Fluid:  >/= 1.8 L/day  Kerman Passey MS, RDN, LDN, CNSC RD Pager Number and RD On-Call Pager Number Located in [place]

## 2019-06-11 NOTE — Progress Notes (Signed)
CT surgery p.m. Rounds  Patient had good day back on CRT, more responsive Respiratory pattern improved after removal of fluid with CRT Continue current care

## 2019-06-11 NOTE — Progress Notes (Signed)
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 Admission Scr was 0.99.  Assessment/ Plan:   1. Acute hypoxic respiratory failure on 05/23/19- on vent via trach. S/p right chest tube. Oliguric/anuricAKI- CRRT started on 4/10 and stopped 4/29 w/ attempted transition to IHD on 05/17/19 but failed and required resumption of CRRT 1. Remains anuric. 2. Resumed CRRT on 05/23/19 following acute worsening of his oxygenation and hypotension.  3. Paused CRRT 5/23 with plans for trial of iHD to assess if he will tolerate.  Trial 5/25 with measures to help tolerate (low temp, albumin, UF profiling) was not successful 4. Resuming CRRT this AM with UF supported by vasopressor titration per CHF team  3. Vascular access-LIJ tunneledcatheter placed by IR on 05/19/19 4. Anemia of CKD, multifactorial-transfusions per primary team. Hemoglobin 7.8 today. Darbo 171mg started 5/11 > titrate to 150 now 5. CAD- 3 vessel with EF 25% s/p CABG and MVR 43/5/45complicated by cardiogenic shock as below 6. Acute systolic CHF and cardiogenic shock-  Dobutamine, NE, florinef, midodrine.  7. Electrolytes- K 5.8 this AM, was given lokelma and CRRT will correct. 8. MR s/p MVR 9. Severe debility  Plan is to continue current LOC until grandson graduation HS early 06/2019 then re-evaluate  Subjective:   Failed iHD trial yesterday.  More agitated overnight.  Resuming CRRT this AM. Family at bedside - updated.   Objective:   BP 107/63 (BP Location: Left Arm)   Pulse 74   Temp 97.9 F (36.6 C) (Oral)   Resp (!) 26   Ht 5' 11"  (1.803 m) Comment: measured x 3  Wt 69.3 kg   SpO2 97%   BMI 21.31 kg/m   Intake/Output Summary (Last 24 hours) at 06/11/2019 1244 Last data filed at 06/11/2019 1200 Gross  per 24 hour  Intake 2614.58 ml  Output 583 ml  Net 2031.58 ml   Weight change: 0.3 kg  Physical Exam: Gen: frail, thin, cachectic, drowsy in bed Neck: lef IJTunneled cath(placed 5/3) CVS: normal rate Resp:Coarse bilateral breath sounds, ventilated/trached, rt sided CT Abd: +BS, soft, NT/ND EGYB:WLSLHTDSK1+ lower extremity edema-atrophied legs  Imaging: DG Chest Port 1 View  Result Date: 06/11/2019 CLINICAL DATA:  Tracheostomy, chest tube, respiratory failure, open-heart surgery. EXAM: PORTABLE CHEST 1 VIEW COMPARISON:  06/09/2019 and CT chest 05/23/2019. FINDINGS: Tracheostomy is midline. Feeding tube is followed into the stomach with the tip projecting beyond the inferior margin of the image. Right PICC and left IJ dialysis catheter tips are in the right atrium. Right chest tube terminates at the apex of the right hemithorax. Seven intact sternotomy wires. Cardiomediastinal silhouette is unchanged. Diffuse mixed interstitial and airspace opacification, minimally improved in the left upper lobe. Focal opacities in the right mid and lower lung zones, similar. Left costophrenic angle is not included on the image. There may be fluid in the right costophrenic angle. No definite pneumothorax. IMPRESSION: 1. Diffuse bilateral mixed interstitial and airspace opacification with slight improvement in the left upper lobe. Findings may be due to pulmonary edema or acute respiratory distress syndrome in the appropriate clinical setting. 2. Probable right pleural effusion. 3. No definite pneumothorax with right chest tube in place. Electronically Signed   By: MLorin PicketM.D.   On: 06/11/2019 09:22    Labs: BMET Recent  Labs  Lab 06/08/19 0254 06/08/19 0254 06/08/19 1643 06/08/19 1643 06/09/19 0301 06/09/19 0837 06/09/19 1627 06/10/19 0330 06/10/19 1730 06/11/19 0330 06/11/19 0921  NA 133*   < > 132*   < > 131* 130* 131* 129* 127* 126* 128*  K 4.5   < > 4.8   < > 5.3* 5.2* 5.4*  5.7* 5.4* 5.8* 6.0*  CL 100   < > 99  --  100 98 99 94* 93* 93*  --   CO2 27   < > 27  --  26 25 26 25 26 25   --   GLUCOSE 303*   < > 360*  --  298* 94 184* 236* 283* 267*  --   BUN 9   < > 10  --  16 19 25* 35* 37* 42*  --   CREATININE 0.83   < > 0.96  --  1.36* 1.55* 1.73* 2.04* 2.22* 2.50*  --   CALCIUM 7.9*   < > 8.1*  --  8.4* 8.5* 8.3* 8.5* 8.5* 8.5*  --   PHOS 1.4*  --  2.0*  --  2.1*  --  2.7 3.0 3.3 3.6  --    < > = values in this interval not displayed.   CBC Recent Labs  Lab 06/08/19 0254 06/08/19 0254 06/09/19 0301 06/10/19 0901 06/11/19 0330 06/11/19 0921  WBC 4.9  --  4.9 5.8 4.8  --   HGB 8.2*   < > 8.0* 8.2* 7.8* 8.5*  HCT 26.3*   < > 26.0* 27.0* 25.3* 25.0*  MCV 96.7  --  96.3 96.4 95.8  --   PLT 79*  --  88* 112* 114*  --    < > = values in this interval not displayed.    Medications:    . aspirin EC  325 mg Oral Daily   Or  . aspirin  324 mg Per Tube Daily  . B-complex with vitamin C  1 tablet Per Tube Daily  . chlorhexidine gluconate (MEDLINE KIT)  15 mL Mouth Rinse BID  . citalopram  10 mg Per Tube Daily  . collagenase   Topical Daily  . darbepoetin (ARANESP) injection - NON-DIALYSIS  100 mcg Subcutaneous Q Tue-1800  . docusate  200 mg Per Tube Daily  . feeding supplement (PRO-STAT SUGAR FREE 64)  30 mL Per Tube BID  . feeding supplement (VITAL 1.5 CAL)  1,000 mL Per Tube Q24H  . fentaNYL  1 patch Transdermal Q72H  . fludrocortisone  0.2 mg Oral Daily  . gabapentin  100 mg Oral QHS  . insulin aspart  0-6 Units Subcutaneous Q4H  . mouth rinse  15 mL Mouth Rinse 10 times per day  . melatonin  3 mg Per Tube QHS  . metoCLOPramide (REGLAN) injection  10 mg Intravenous Q6H  . midodrine  20 mg Per Tube Q8H  . pantoprazole sodium  40 mg Per Tube BID  . polyethylene glycol  17 g Oral Daily  . QUEtiapine  25 mg Oral QHS  . rOPINIRole  0.5 mg Oral QHS  . sennosides  5 mL Per Tube BID  . sodium chloride flush  10-40 mL Intracatheter Q12H  . sorbitol  15  mL Oral q morning - 10a    Jannifer Hick MD [facility] Pager [phone]

## 2019-06-11 NOTE — Progress Notes (Signed)
Occupational Therapy Treatment Patient Details Name: Brendan [name] MRN: [id number] DOB: 1955-03-24 Today's Date: 06/11/2019    History of present illness Pt presented with SOB and fatigue at [facility] found to have new HFrEF,  NSTEMI, and bilateral pleural effusions transferred to [facility] on 3/29 for further cardiac evaluation.  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  On the evening of 4/1, patient developed worsening respiratory distress and hypoxia, head R/L heart cath on 3/30 & R heart cath on 4/1 pt intubated on 4/1, extubated 4/4, had R & L chest tubes placed 4/2 due to bilateral pleural effusions, removed 4/5 and impella device placed 4/2.  CABG and MVR on 4/6. Extubated 4/7. Removal of impella 4/9. CVVH started 4/10 and off 4/30. Respiratory distress and reintubated 4/12. Extubated 4/14. Reintubated 4/15. Trach 4/16.  CRRT off 4/29 then restarted 5/7 now off again. Trach collar 5/2 with return to vent 5/7. PMHx: Tobacco abuse, HTN, poorly controlled DM, diabetic neuropathy.   OT comments  O2 on 40% O2 5 PEEP O2 >95%. Pt with greater ability to follow commands usign thumbs up/down for tasks; pt would become fatigued and require cues to stay alert for tasks.  Pt in chair position at 47* for session; Pt's session focus on BUE/BLE HEP and following commands with BP stable. Pt would benefit from continued OT. Family reports that they come later in the day- pt was appreciative of their company. Pt back on CRRT. OT following acutely.   Follow Up Recommendations  LTACH    Equipment Recommendations  Other (comment)(defer)    Recommendations for Other Services      Precautions / Restrictions Precautions Precautions: Sternal;Fall Precaution Comments: trach on vent, chest tube, flexiseal, CRRT Restrictions Weight Bearing Restrictions: No       Mobility Bed Mobility Overal bed mobility: Needs Assistance             General bed mobility comments: chair position  Transfers Overall transfer  level: Needs assistance Equipment used: None             General transfer comment: chair position    Balance Overall balance assessment: Needs assistance Sitting-balance support: Feet unsupported Sitting balance-Leahy Scale: Poor Sitting balance - Comments: long sitting with support x30 secs                                   ADL either performed or assessed with clinical judgement   ADL Overall ADL's : Needs assistance/impaired                                     Functional mobility during ADLs: Moderate assistance;+2 for physical assistance General ADL Comments: Pt in chair position at 47* for session; Pt's session focus on HEP and following commands with BP stable.     Vision   Vision Assessment?: No apparent visual deficits   Perception     Praxis      Cognition Arousal/Alertness: Lethargic Behavior During Therapy: Flat affect Overall Cognitive Status: Impaired/Different from baseline Area of Impairment: Following commands;Awareness;Problem solving                       Following Commands: Follows one step commands inconsistently   Awareness: Emergent Problem Solving: Slow processing;Decreased initiation;Difficulty sequencing;Requires verbal cues;Requires tactile cues General Comments: Pt with greater ability to follow  commands usign thumbs up/down for tasks; pt would become fatigued and require cues to stay alert for tasks.        Exercises Exercises: Other exercises;General Upper Extremity Total Joint Exercises Ankle Circles/Pumps: AROM;10 reps;Seated Hip ABduction/ADduction: AROM;10 reps;Supine Long Arc Quad: AROM;10 reps;Supine General Exercises - Upper Extremity Shoulder Flexion: AROM;Both;10 reps;Supine Elbow Flexion: AROM;Both;10 reps;Supine Elbow Extension: AROM;10 reps;Supine Wrist Flexion: AROM;10 reps;Both;Supine Wrist Extension: AROM;Both;10 reps;Supine Digit Composite Flexion: AROM;Both;10  reps;Supine Composite Extension: AROM;Both;Supine General Exercises - Lower Extremity Ankle Circles/Pumps: AROM;Both;Seated;15 reps Long Arc Quad: AAROM;AROM;Seated;15 reps Heel Slides: AROM;AAROM;Right;Left;Supine;15 reps Hip ABduction/ADduction: AROM;AAROM;Supine;15 reps Hip Flexion/Marching: AROM;AAROM;Right;Left;Seated;15 reps   Shoulder Instructions       General Comments BP stable in chair position: 108/56 with long sitting. Pt coming in/out of lethargy. Family present and pt able to smile frequently.     Pertinent Vitals/ Pain       Pain Assessment: Faces Faces Pain Scale: Hurts a little bit Pain Location: on bottom and L side Pain Descriptors / Indicators: Discomfort;Grimacing Pain Intervention(s): Monitored during session;Repositioned  Home Living                                          Prior Functioning/Environment              Frequency  Min 2X/week        Progress Toward Goals  OT Goals(current goals can now be found in the care plan section)  Progress towards OT goals: Progressing toward goals  Acute Rehab OT Goals Patient Stated Goal: not stated OT Goal Formulation: Patient unable to participate in goal setting Time For Goal Achievement: 06/23/19 Potential to Achieve Goals: Good ADL Goals Pt Will Perform Grooming: with supervision;sitting Pt Will Perform Lower Body Dressing: with mod assist;with adaptive equipment;sitting/lateral leans;sit to/from stand Pt/caregiver will Perform Home Exercise Program: Increased strength;Both right and left upper extremity;With Supervision Additional ADL Goal #1: Pt  will verbalize 3 strategies to conserve energy during functional ADL and mobility tasks with no verbal cues Additional ADL Goal #2: Pt will increase to minA overall for bed mobility as precursor for OOB ADL. Additional ADL Goal #3: Pt will be able to verbalize/abide by sternal precautions with minimal cues required throughout session.   Plan Discharge plan remains appropriate    Co-evaluation                 AM-PAC OT "6 Clicks" Daily Activity     Outcome Measure   Help from another person eating meals?: Total Help from another person taking care of personal grooming?: A Lot Help from another person toileting, which includes using toliet, bedpan, or urinal?: Total Help from another person bathing (including washing, rinsing, drying)?: Total Help from another person to put on and taking off regular upper body clothing?: A Lot Help from another person to put on and taking off regular lower body clothing?: Total 6 Click Score: 8    End of Session Equipment Utilized During Treatment: Oxygen;Gait belt  OT Visit Diagnosis: Unsteadiness on feet (R26.81);Muscle weakness (generalized) (M62.81);Other symptoms and signs involving cognitive function Pain - Right/Left: Left Pain - part of body: (side and bottom)   Activity Tolerance Patient limited by fatigue   Patient Left in bed;with call bell/phone within reach;with bed alarm set;with family/visitor present   Nurse Communication Mobility status        Time: 8588-5027 OT Time Calculation (  min): 25 min  Charges: OT General Charges $OT Visit: 1 Visit OT Treatments $Therapeutic Exercise: 23-37 mins  Jefferey Pica, OTR/L Acute Rehabilitation Services Pager: [phone] Office: [phone]    Jakyle Petrucelli C 06/11/2019, 4:42 PM

## 2019-06-11 NOTE — Progress Notes (Addendum)
[facility] DAILY ICU PROGRESS NOTE                   [address]            [place] [address]          [phone]   40 Days Post-Op Procedure(s) (LRB): VIDEO BRONCHOSCOPY USING DISPOSABLE ANESTHESIA SCOPE (N/A) TRACHEOSTOMY (N/A)  Total Length of Stay:  LOS: 58 days   Subjective: Per nurse report, he had a bad night. He was agitated and more lethargic  Objective: Vital signs in last 24 hours: Temp:  [97.6 F (36.4 C)-98 F (36.7 C)] 97.8 F (36.6 C) (05/26 0300) Pulse Rate:  [73-87] 80 (05/26 0700) Cardiac Rhythm: Normal sinus rhythm (05/26 0400) Resp:  [15-30] 16 (05/26 0700) BP: (64-172)/(35-151) 79/47 (05/26 0645) SpO2:  [76 %-100 %] 94 % (05/26 0700) FiO2 (%):  [40 %] 40 % (05/26 0400) Weight:  [67.1 kg-69.3 kg] 69.3 kg (05/26 0445)  Filed Weights   06/10/19 1127 06/10/19 1215 06/11/19 0445  Weight: 67.1 kg 67.3 kg 69.3 kg    Weight change: 0.3 kg   CVP:  [5 mmHg-25 mmHg] 11 mmHg  Intake/Output from previous day: 05/25 0701 - 05/26 0700 In: 2009.6 [I.V.:949.6; NG/GT:998; IV Piggyback:62.1] Out: 50 [Stool:250]  Intake/Output this shift: No intake/output data recorded.  Current Meds: Scheduled Meds: . aspirin EC  325 mg Oral Daily   Or  . aspirin  324 mg Per Tube Daily  . B-complex with vitamin C  1 tablet Per Tube Daily  . chlorhexidine gluconate (MEDLINE KIT)  15 mL Mouth Rinse BID  . citalopram  10 mg Per Tube Daily  . collagenase   Topical Daily  . darbepoetin (ARANESP) injection - NON-DIALYSIS  100 mcg Subcutaneous Q Tue-1800  . docusate  200 mg Per Tube Daily  . feeding supplement (PRO-STAT SUGAR FREE 64)  30 mL Per Tube BID  . feeding supplement (VITAL 1.5 CAL)  1,000 mL Per Tube Q24H  . fentaNYL  1 patch Transdermal Q72H  . fludrocortisone  0.2 mg Oral Daily  . gabapentin  100 mg Oral QHS  . insulin aspart  0-6 Units Subcutaneous Q4H  . mouth rinse  15 mL Mouth Rinse 10 times per day  . melatonin  3 mg Per Tube QHS  .  metoCLOPramide (REGLAN) injection  10 mg Intravenous Q6H  . midodrine  20 mg Per Tube Q8H  . pantoprazole sodium  40 mg Per Tube BID  . polyethylene glycol  17 g Oral Daily  . QUEtiapine  25 mg Oral QHS  . rOPINIRole  0.5 mg Oral QHS  . sennosides  5 mL Per Tube BID  . sodium chloride flush  10-40 mL Intracatheter Q12H  . sodium zirconium cyclosilicate  10 g Per Tube Daily  . sorbitol  15 mL Oral q morning - 10a   Continuous Infusions: .  prismasol BGK 4/2.5 500 mL/hr at 06/08/19 0756  .  prismasol BGK 4/2.5 300 mL/hr at 06/08/19 0404  . sodium chloride 250 mL (06/10/19 1053)  . sodium chloride Stopped (05/31/19 0958)  . dextrose 30 mL/hr at 06/11/19 0700  . dextrose 5 % and 0.9% NaCl Stopped (06/06/19 0513)  . DOBUTamine 5 mcg/kg/min (06/11/19 0700)  . heparin    . norepinephrine (LEVOPHED) Adult infusion 6 mcg/min (06/11/19 0700)  . prismasol BGK 4/2.5 1,000 mL/hr at 06/08/19 0603   PRN Meds:.sodium chloride, alum & mag hydroxide-simeth, dextrose, diphenhydrAMINE, fentaNYL (SUBLIMAZE) injection,  Gerhardt's butt cream, heparin, heparin, levalbuterol, ondansetron (ZOFRAN) IV, pneumococcal 23 valent vaccine, sodium chloride flush, traMADol  Heart: RRR Lungs: Coarse breath sounds bilaterally Abdomen: Soft, bowel sounds present Extremities: Boots in place Wound: Clean and dry.  Right chest tube: Little to no drainage of late  Lab Results: CBC: Recent Labs    06/10/19 0901 06/11/19 0330  WBC 5.8 4.8  HGB 8.2* 7.8*  HCT 27.0* 25.3*  PLT 112* 114*   BMET:  Recent Labs    06/10/19 1730 06/11/19 0330  NA 127* 126*  K 5.4* 5.8*  CL 93* 93*  CO2 26 25  GLUCOSE 283* 267*  BUN 37* 42*  CREATININE 2.22* 2.50*  CALCIUM 8.5* 8.5*    CMET: Lab Results  Component Value Date   WBC 4.8 06/11/2019   HGB 7.8 (L) 06/11/2019   HCT 25.3 (L) 06/11/2019   PLT 114 (L) 06/11/2019   GLUCOSE 267 (H) 06/11/2019   CHOL 174 08/21/2017   TRIG 143 08/21/2017   HDL 57 08/21/2017    LDLCALC 88 08/21/2017   ALT 14 06/06/2019   AST 24 06/06/2019   NA 126 (L) 06/11/2019   K 5.8 (H) 06/11/2019   CL 93 (L) 06/11/2019   CREATININE 2.50 (H) 06/11/2019   BUN 42 (H) 06/11/2019   CO2 25 06/11/2019   TSH 0.925 03/23/2019   INR 1.6 (H) 05/01/2019   HGBA1C 9.0 (H) 04/16/2019   MICROALBUR 30 08/21/2017    PT/INR:  No results for input(s): LABPROT, INR in the last 72 hours. Radiology: No results found.  Assessment/Plan: S/P Procedure(s) (LRB): VIDEO BRONCHOSCOPY USING DISPOSABLE ANESTHESIA SCOPE (N/A) TRACHEOSTOMY (N/A)  1. CV-S/p removal of Impella on 04/09.  S/p V tach arrest 04/15. Previous a fib. SR with HR in the 80's this am.  On  Midodrine 20 mg tid and Nor epinephrine 6 mcg/min, and Dobutamine drip 5 mcg/kg/min. Co ox this am 63..8  2. Pulmonary-S/p trach 04/16.  Right chest tube with no recorded ouptut last 24 hours. CXR this am appears to show severe, diffuse bilateral airspace disease. 3. Expected post op blood loss anemia-H and H yesterday slightly increased to 7.8 and 25.3  Has had transfusions previously. Continue Aranesp 4. DM-CBGs 129/155/109 . He was on Metformin 1000 mg bid prior to surgery, but will continue on Insulin for now as NPO, TFs. Pre op HGA1C 9.  5. Oliguric/anuric AKI-Creatinine 2.5 this am. Nephrology following and arranging for CRRT accordingly. Per nephrology, continue CRRT as long as no UOP and needs pressors 6. GI-severe malnutrition of chronic illness.  Cortrak, TFs. Speech pathology placed post pyloric Cortrak recently 7. Acute systolic heart failure-CVVHD helping with volume status 8. Previous elevated transaminases, AST 29, ALT 15 and alk phos down to 294. 9. Extremely deconditioned-will need PT as able. Per family wishes, continue full support 10. Hyponatremia this am Rafael Capo Zimmerman PA-C 06/11/2019 7:34 AM    Patient did not tolerate HD yesterday Renal plan is to resume CRT to support patient until important family event  early June Will dose wih Lokelma for rising K while CRT is being setup BP, coox, O2 sats stable but patient more agitated  patient examined and medical record reviewed,agree with above note. [name] 06/11/2019

## 2019-06-11 NOTE — Progress Notes (Signed)
SLP Cancellation Note  Patient Details Name: Brendan [name] MRN: [id number] DOB: 22-Apr-1955   Cancelled treatment:       Reason Eval/Treat Not Completed: Medical issues which prohibited therapy. SLP addressed readiness for in-line PMSV session today. Pt had a bad morning. Set up for pm with family at bedside, they left for the day and requested SLP/RT attempt tomorrow if possible. Will f/u   Herbie [place], [place] Pager [phone] Office [phone]  Lynann Beaver 06/11/2019, 2:00 PM

## 2019-06-11 NOTE — Progress Notes (Signed)
Palliative:  AQV:OHCSPZZCKI Care consult requested for goals of care discussion in this63 y.o.malewith multiple medical problems including poorly controlled diabetes complicated by neuropathy and hypertension. Patient presented to outside hospital on 3/26 and transferred to [facility] for further evaluation due to elevated troponin and newly depressed EF. During his initial work-up patient was found to have a blood sugar of 516, BUN53, creatinine 1.2, and anion gap of 17. Patient's BNP was elevated to 8900. Troponin was elevated to 13.1 with changes noted by the team of their lateral leads as well as V1 and V2.Chest x-ray showed concerns for bilateral pulmonary opacities. Patient was initiated on aspirin, heparin drip, and IV antibiotics. Recent echo showed EF of 25%. Since admission patient required intubation on 4/1, extubated on 4/4, re-intubated 4/6 for CABG 4/6, extubated post procedure 4/7, CRRT initiated on 4/10with brief transition to iHD and CRRT began again 5/7, required re-intubation on 4/12 due to respiratory failure, tracheostomy performed on 4/16, 5/7 right chest tube placed due to enlarging effusion. Continues to require midodrine and pressorsas well as ventilator support.CRRT holiday with trial of iHD planned for 5/25. Failed trial of iHD  5/25 due to hypotension and CRRT restarted 5/26.   I met today at Brendan [name] bedside with wife Brendan [name] and daughter Brendan [name]. Brendan [name] is sleeping. He is more alert but still with some confusion. Family report that awoke yesterday and was sitting straight up in bed. He was telling them that he was tired of working and wanting to go home. They are hopeful, as am I, that with CRRT his confusion will improve. Family still hopeful for overall improvement.   All questions/concerns addressed. Emotional support provided.   Exam: Sleeping. No distress. Thin, frail. HR regular 80s. Breathing regular, unlabored via trach to vent. Abd flat; flexiseal in  place. Edema much improved. Generalized weakness.  Plan: - Awaiting grandson's high school graduation June 2nd 7 pm. Reassess goals of care June 3rd.   15 min  Brendan Sill, NP Palliative Medicine Team Pager [phone] (Please see [email or web address] for schedule) Team Phone [phone]    Greater than 50%  of this time was spent counseling and coordinating care related to the above assessment and plan

## 2019-06-12 ENCOUNTER — Inpatient Hospital Stay (HOSPITAL_COMMUNITY): Payer: Medicaid Other

## 2019-06-12 LAB — RENAL FUNCTION PANEL
Albumin: 2.2 g/dL — ABNORMAL LOW (ref 3.5–5.0)
Albumin: 2.3 g/dL — ABNORMAL LOW (ref 3.5–5.0)
Anion gap: 9 (ref 5–15)
Anion gap: 6 (ref 5–15)
BUN: 35 mg/dL — ABNORMAL HIGH (ref 8–23)
BUN: 34 mg/dL — ABNORMAL HIGH (ref 8–23)
CO2: 28 mmol/L (ref 22–32)
CO2: 28 mmol/L (ref 22–32)
Calcium: 8.4 mg/dL — ABNORMAL LOW (ref 8.9–10.3)
Calcium: 8.3 mg/dL — ABNORMAL LOW (ref 8.9–10.3)
Chloride: 94 mmol/L — ABNORMAL LOW (ref 98–111)
Chloride: 98 mmol/L (ref 98–111)
Creatinine, Ser: 1.62 mg/dL — ABNORMAL HIGH (ref 0.61–1.24)
Creatinine, Ser: 1.42 mg/dL — ABNORMAL HIGH (ref 0.61–1.24)
GFR calc Af Amer: 52 mL/min — ABNORMAL LOW (ref >60)
GFR calc Af Amer: >60 mL/min (ref >60)
GFR calc non Af Amer: 45 mL/min — ABNORMAL LOW (ref >60)
GFR calc non Af Amer: 53 mL/min — ABNORMAL LOW (ref >60)
Glucose, Bld: 119 mg/dL — ABNORMAL HIGH (ref 70–99)
Glucose, Bld: 140 mg/dL — ABNORMAL HIGH (ref 70–99)
Phosphorus: 2.8 mg/dL (ref 2.5–4.6)
Phosphorus: 2.5 mg/dL (ref 2.5–4.6)
Potassium: 5.8 mmol/L — ABNORMAL HIGH (ref 3.5–5.1)
Potassium: 5.8 mmol/L — ABNORMAL HIGH (ref 3.5–5.1)
Sodium: 131 mmol/L — ABNORMAL LOW (ref 135–145)
Sodium: 132 mmol/L — ABNORMAL LOW (ref 135–145)

## 2019-06-12 LAB — CBC
HCT: 25.4 % — ABNORMAL LOW (ref 39.0–52.0)
Hemoglobin: 7.8 g/dL — ABNORMAL LOW (ref 13.0–17.0)
MCH: 29.3 pg (ref 26.0–34.0)
MCHC: 30.7 g/dL (ref 30.0–36.0)
MCV: 95.5 fL (ref 80.0–100.0)
Platelets: 111 10*3/uL — ABNORMAL LOW (ref 150–400)
RBC: 2.66 MIL/uL — ABNORMAL LOW (ref 4.22–5.81)
RDW: 15.6 % — ABNORMAL HIGH (ref 11.5–15.5)
WBC: 3.6 10*3/uL — ABNORMAL LOW (ref 4.0–10.5)
nRBC: 0 % (ref 0.0–0.2)

## 2019-06-12 LAB — COOXEMETRY PANEL
Carboxyhemoglobin: 1.1 % (ref 0.5–1.5)
Methemoglobin: 0.8 % (ref 0.0–1.5)
O2 Saturation: 53.3 %
Total hemoglobin: 8.1 g/dL — ABNORMAL LOW (ref 12.0–16.0)

## 2019-06-12 LAB — GLUCOSE, CAPILLARY
Glucose-Capillary: 111 mg/dL — ABNORMAL HIGH (ref 70–99)
Glucose-Capillary: 96 mg/dL (ref 70–99)
Glucose-Capillary: 123 mg/dL — ABNORMAL HIGH (ref 70–99)
Glucose-Capillary: 137 mg/dL — ABNORMAL HIGH (ref 70–99)
Glucose-Capillary: 147 mg/dL — ABNORMAL HIGH (ref 70–99)

## 2019-06-12 LAB — MAGNESIUM: Magnesium: 2.6 mg/dL — ABNORMAL HIGH (ref 1.7–2.4)

## 2019-06-12 MED ORDER — POLYETHYLENE GLYCOL 3350 17 G PO PACK: 17.0000 g | PACK | Freq: Every day | ORAL | Status: DC | PRN

## 2019-06-12 MED ORDER — SENNOSIDES 8.8 MG/5ML PO SYRP: 5.0000 mL | ORAL_SOLUTION | Freq: Every evening | ORAL | Status: DC | PRN

## 2019-06-12 MED ORDER — SORBITOL 70 % SOLN: 15.0000 mL | Freq: Every day | Status: DC | PRN

## 2019-06-12 MED ORDER — SODIUM ZIRCONIUM CYCLOSILICATE 10 G PO PACK
10.0000 g | PACK | Freq: Every day | ORAL | Status: AC
  Administered 2019-06-12: 10 g
  Filled 2019-06-12: qty 1

## 2019-06-12 MED ORDER — PRISMASOL BGK 0/2.5 32-2.5 MEQ/L REPLACEMENT SOLN
Status: DC
  Filled 2019-06-12 (×8): qty 5000

## 2019-06-12 NOTE — Progress Notes (Signed)
Patient ID: Brendan [name], male   DOB: Apr 20, 1955, 64 y.o.   MRN: [id number]     Advanced Heart Failure Rounding Note  PCP-Cardiologist: No primary care provider on file.   Subjective:    Remains on CVVHD. Trying to pull -50-100. More alert today but still very lethargic.   NE now up to 10. Remains on DBA and midodrine.   On full vent support this am. Planning PS trial later today.    Objective:   Weight Range: 70.1 kg Body mass index is 21.55 kg/m.   Vital Signs:   Temp:  [93.6 F (34.2 C)-93.8 F (34.3 C)] 93.8 F (34.3 C) (05/27 0400) Pulse Rate:  [62-84] 84 (05/27 1430) Resp:  [14-35] 28 (05/27 1430) BP: (73-193)/(34-179) 99/70 (05/27 1430) SpO2:  [84 %-100 %] 99 % (05/27 1430) FiO2 (%):  [40 %] 40 % (05/27 1151) Weight:  [70.1 kg] 70.1 kg (05/27 0500) Last BM Date: 06/12/19  Weight change: Filed Weights   06/10/19 1215 06/11/19 0445 06/12/19 0500  Weight: 67.3 kg 69.3 kg 70.1 kg    Intake/Output:   Intake/Output Summary (Last 24 hours) at 06/12/2019 1442 Last data filed at 06/12/2019 1400 Gross per 24 hour  Intake 2596.78 ml  Output 4110 ml  Net -1513.22 ml      Physical Exam  CVP 11 General:  Cachetic. Chronically ill on vent HEENT: normal + cor-trak Neck: supple. JVP to jaw. + trach Carotids 2+ bilat; no bruits. No lymphadenopathy or thryomegaly appreciated. Cor: PMI nondisplaced. Regular rate & rhythm. No rubs, gallops or murmurs. Lungs: coarse Abdomen: soft, nontender, mildly distended. No hepatosplenomegaly. No bruits or masses. Good bowel sounds. Extremities: no cyanosis, clubbing, rash, 1-2+ edema Neuro:Lethargic but will arouse and follow some commands   Telemetry   SR 70-80s Personally reviewed    Labs    CBC Recent Labs    06/11/19 0330 06/11/19 0330 06/11/19 0921 06/12/19 0324  WBC 4.8  --   --  3.6*  HGB 7.8*   < > 8.5* 7.8*  HCT 25.3*   < > 25.0* 25.4*  MCV 95.8  --   --  95.5  PLT 114*  --   --  111*   < > = values  in this interval not displayed.   Basic Metabolic Panel Recent Labs    06/11/19 0330 06/11/19 0921 06/11/19 1839 06/12/19 0324  NA 126*   < > 128* 131*  K 5.8*   < > 5.6* 5.8*  CL 93*  --  96* 94*  CO2 25  --  26 28  GLUCOSE 267*  --  198* 119*  BUN 42*  --  36* 35*  CREATININE 2.50*  --  1.96* 1.62*  CALCIUM 8.5*  --  8.1* 8.4*  MG 2.5*  --   --  2.6*  PHOS 3.6  --  3.3 2.8   < > = values in this interval not displayed.   Liver Function Tests Recent Labs    06/11/19 1839 06/12/19 0324  ALBUMIN 2.2* 2.2*   No results for input(s): LIPASE, AMYLASE in the last 72 hours. Cardiac Enzymes No results for input(s): CKTOTAL, CKMB, CKMBINDEX, TROPONINI in the last 72 hours.  BNP: BNP (last 3 results) Recent Labs    04/16/2019 0113  BNP 655.7*    ProBNP (last 3 results) No results for input(s): PROBNP in the last 8760 hours.   D-Dimer No results for input(s): DDIMER in the last 72 hours. Hemoglobin A1C No results  for input(s): HGBA1C in the last 72 hours. Fasting Lipid Panel No results for input(s): CHOL, HDL, LDLCALC, TRIG, CHOLHDL, LDLDIRECT in the last 72 hours. Thyroid Function Tests No results for input(s): TSH, T4TOTAL, T3FREE, THYROIDAB in the last 72 hours.  Invalid input(s): FREET3  Other results:   Imaging    DG Chest Port 1 View  Result Date: 06/12/2019 CLINICAL DATA:  Status post CABG EXAM: PORTABLE CHEST 1 VIEW COMPARISON:  06/11/2019 FINDINGS: Right chest tube in place without pneumothorax. Tracheostomy tube tip is above the carina. Large bore central venous catheter is identified with tip at the cavoatrial junction. There is a right arm PICC line with tip also at the cavoatrial junction. Feeding tube is in place. Stable cardiomediastinal silhouette. Diffuse bilateral airspace densities appear unchanged from the previous exam. IMPRESSION: 1. Stable support apparatus.  Right chest tube without pneumothorax. 2. No change in aeration to the lungs compared  with previous exam. Electronically Signed   By: Kerby Moors M.D.   On: 06/12/2019 08:41     Medications:     Scheduled Medications: . aspirin EC  325 mg Oral Daily   Or  . aspirin  324 mg Per Tube Daily  . B-complex with vitamin C  1 tablet Per Tube Daily  . chlorhexidine gluconate (MEDLINE KIT)  15 mL Mouth Rinse BID  . citalopram  10 mg Per Tube Daily  . collagenase   Topical Daily  . [START ON 06/17/2019] darbepoetin (ARANESP) injection - NON-DIALYSIS  150 mcg Subcutaneous Q Tue-1800  . feeding supplement (PRO-STAT SUGAR FREE 64)  30 mL Per Tube BID  . feeding supplement (VITAL 1.5 CAL)  1,000 mL Per Tube Q24H  . fentaNYL  1 patch Transdermal Q72H  . fludrocortisone  0.2 mg Oral Daily  . gabapentin  100 mg Oral QHS  . insulin aspart  0-6 Units Subcutaneous Q4H  . mouth rinse  15 mL Mouth Rinse 10 times per day  . melatonin  3 mg Per Tube QHS  . metoCLOPramide (REGLAN) injection  10 mg Intravenous Q6H  . midodrine  20 mg Per Tube Q8H  . nutrition supplement (JUVEN)  1 packet Per Tube BID  . pantoprazole sodium  40 mg Per Tube BID  . QUEtiapine  25 mg Oral QHS  . rOPINIRole  0.5 mg Oral QHS  . sodium chloride flush  10-40 mL Intracatheter Q12H    Infusions: .  prismasol BGK 4/2.5 500 mL/hr at 06/12/19 0441  .  prismasol BGK 4/2.5 300 mL/hr at 06/12/19 0127  . sodium chloride 250 mL (06/10/19 1053)  . sodium chloride Stopped (05/31/19 0958)  . dextrose 5 % and 0.9% NaCl Stopped (06/06/19 0513)  . DOBUTamine 5 mcg/kg/min (06/12/19 1400)  . heparin 999 mL/hr at 06/11/19 0836  . norepinephrine (LEVOPHED) Adult infusion 10 mcg/min (06/12/19 1400)  . prismasol BGK 4/2.5 1,000 mL/hr at 06/12/19 1036    PRN Medications: sodium chloride, alum & mag hydroxide-simeth, dextrose, diphenhydrAMINE, fentaNYL (SUBLIMAZE) injection, Gerhardt's butt cream, heparin, heparin, heparin, levalbuterol, ondansetron (ZOFRAN) IV, pneumococcal 23 valent vaccine, polyethylene glycol, sennosides,  sodium chloride flush, sorbitol, traMADol     Assessment/Plan   1. Acute systolic HF ->  Cardiogenic Shock  - Due to iCM. EF 20-25% - Impella 5.5 placed on 4/2.   - s/p CABG/MVRepair on 4/6  - Impella out 4/9.  - Echo 4/10 with EF 25-30%, normal RV, no MR.  - Echo 4/22 EF 30-35% mildly reduced RV MVR ok   -  Dobutamine added 5/14 for low co-ox 39% and inability to wean NE.  - CO-OX 53%. CVP 11 -- Dobutamine increased to 5 on 5/21 due to low BP.  - Back on CRRT and NE now up to 10 to support fluid removal. .Remains on midodrine at 20 tid and florinef to support BP.  - Repeat echo 05/29/19. EF 35-40% RV ok.   2. 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 Echo stable - No s/s ischemia - On ASA/Crestor  3. Acute hypoxic respiratory failure - Due to pulmonary edema and PNA - Extubated 4/9 re-intubated on 4/12. Failed re-extubation on 4/14. Now s/p tracheostomy - S/P Bronch 4/27 - 60K yeast. -> ? Colonization.  - CT scan 5/7 with diffuse bilateral infiltrates c/w ARDS and large bilateral effusions R>L.  - s/p R chest tube. No output.  - Remains on vent FiO2 40% - CCM following. Not ready to wean   4. Mitral Regurgitation - Mod-severe on ECHO - s/p MV repair, TEE 4/9 with minimal MR s/p repair.   - Echo 4/10 with no significant MR.  - echo 4/22 MVR stable - Repeat echo 05/29/19. EF 35-40% RV ok.  MVR ok  5. Uncontrolled DM - Hgb A1C 9.  - On insulin and sliding scale.  - No change  6. AKI - due to shock - Remains anuric post-op - Back on CRRT due to inability to pull with iHD due to hypotension.  - This remains the main issue currently. Unable to wean to iHD due to pressor dependence. It has now been > 4 weeks. We have tried many options to get him of pressors including blood transfusions, albumin, letting fluid re-accumulate, midodrine at 20 tid but remain unsuccessful. -Intolerant HD 5/25 due to hypotension.  - Significantly volume overloaded  continue NE support to get fluid off   7. Acute blood loss Anemia  - Transfused 1UPRBCs 5/9/21and 5/14, 5/17 and 5/19  - hgb 7.8  - transfuse hgb <= 7.5   8. Polymorphic VT - Recurrent VT during respiratory distress 4/14, - Off amio  - No further VT - Keep K> 4.0 Mg > 2.0   9. Severe Malnutrition - Prealbumin improved, up from 8.9>>18.7 - Nutrition on board - Continue w/ TFs  10. Atrial fibrillation. paroxysmal - Remains in NSR.  - Off amiodarone.    11. Debility, severe - Continue PT/OT  12. Chronic pain issues - Appears comfortable.  - having severe constipation. CCM managing   13. Hyponatremia  Sodium up to 131  14. Full code  He continues to struggle with low output HF, renal failure, vent dependdence and hypotension. Unfortunately little progress over last few days.  D/w DR. Prescott Gum and CCM personally.   Glori Bickers  MD  2:42 PM

## 2019-06-12 NOTE — Evaluation (Signed)
Passy-Muir Speaking Valve - Evaluation Patient Details  Name: Brendan [name] MRN: [id number] Date of Birth: [birth year]  Today's Date: 06/12/2019 Time: 0852-0909 SLP Time Calculation (min) (ACUTE ONLY): 17 min  Past Medical History:  Past Medical History:  Diagnosis Date  . Acute kidney injury ([place])   . Cholecystitis 03/2013  . Complication of anesthesia   . Diabetes mellitus without complication ([place])   . Erectile dysfunction 2010  . HTN (hypertension)   . NSTEMI (non-ST elevated myocardial infarction) ([place]) 04/01/2019  . PONV (postoperative nausea and vomiting)    Past Surgical History:  Past Surgical History:  Procedure Laterality Date  . APPENDECTOMY  1969  . BRONCHIAL WASHINGS N/A 04/24/2019   Procedure: Bronchial Washings;  Surgeon: Ivin Poot, MD;  Location: [place];  Service: Open Heart Surgery;  Laterality: N/A;  . CHOLECYSTECTOMY    . CHOLECYSTECTOMY N/A 04/02/2013   Procedure: LAPAROSCOPIC CHOLECYSTECTOMY WITH  INTRAOPERATIVE CHOLANGIOGRAM;  Surgeon: Joyice Faster. Cornett, MD;  Location: [place];  Service: General;  Laterality: N/A;  . CORONARY ARTERY BYPASS GRAFT N/A 05/04/2019   Procedure: CORONARY ARTERY BYPASS GRAFTING (CABG) x3, USING LEFT INTERNAL MAMMARY ARTERY AND LEFT LEG GREATER SAPHENOUS VEIN HARVESTED ENDOSCOPICALLY;  Surgeon: Ivin Poot, MD;  Location: [place];  Service: Open Heart Surgery;  Laterality: N/A;  Inhaled Nitric-Oxide  . ERCP N/A 04/02/2013   Procedure: ENDOSCOPIC RETROGRADE CHOLANGIOPANCREATOGRAPHY (ERCP);  Surgeon: Inda Castle, MD;  Location: [place];  Service: Endoscopy;  Laterality: N/A;  . 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   . IABP INSERTION N/A 05/05/2019   Procedure: IABP INSERTION;  Surgeon: Jolaine Artist, MD;  Location: [place] [facility];  Service: Cardiovascular;  Laterality: N/A;  . IR FLUORO GUIDE CV LINE LEFT  05/19/2019  . IR US GUIDE VASC ACCESS LEFT  05/19/2019  .  MITRAL VALVE REPLACEMENT N/A 04/19/2019   Procedure: MITRAL VALVE (MV) REPAIR USING PHYSIO II RING SIZE 26MM;  Surgeon: Prescott Gum, Collier Salina, MD;  Location: [place];  Service: Open Heart Surgery;  Laterality: N/A;  . PLACEMENT OF IMPELLA LEFT VENTRICULAR ASSIST DEVICE Right 04/30/2019   Procedure: Placement of Impella 5.5 Direct;  Surgeon: Ivin Poot, MD;  Location: [place];  Service: Open Heart Surgery;  Laterality: Right;  . REMOVAL OF IMPELLA LEFT VENTRICULAR ASSIST DEVICE N/A 05/12/2019   Procedure: REMOVAL OF IMPELLA 5.5 direct, LEFT VENTRICULAR ASSIST [place], TEE;  Surgeon: Ivin Poot, MD;  Location: [place];  Service: Open Heart Surgery;  Laterality: N/A;  . RIGHT HEART CATH N/A 05/10/2019   Procedure: RIGHT HEART CATH;  Surgeon: Jolaine Artist, MD;  Location: [place] [facility];  Service: Cardiovascular;  Laterality: N/A;  . RIGHT/LEFT HEART CATH AND CORONARY ANGIOGRAPHY N/A 04/08/2019   Procedure: RIGHT/LEFT HEART CATH AND CORONARY ANGIOGRAPHY;  Surgeon: Martinique, Peter M, MD;  Location: [place] [facility];  Service: Cardiovascular;  Laterality: N/A;  . TEE WITHOUT CARDIOVERSION N/A 05/16/2019   Procedure: TRANSESOPHAGEAL ECHOCARDIOGRAM (TEE);  Surgeon: Prescott Gum, Collier Salina, MD;  Location: [place];  Service: Open Heart Surgery;  Laterality: N/A;  . TEE WITHOUT CARDIOVERSION N/A 04/21/2019   Procedure: TRANSESOPHAGEAL ECHOCARDIOGRAM (TEE);  Surgeon: Prescott Gum, Collier Salina, MD;  Location: [place];  Service: Open Heart Surgery;  Laterality: N/A;  . TRACHEOSTOMY TUBE PLACEMENT N/A 04/19/2019   Procedure: TRACHEOSTOMY;  Surgeon: Ivin Poot, MD;  Location: [place];  Service: Thoracic;  Laterality:  N/A;  . VIDEO BRONCHOSCOPY N/A 04/27/2019   Procedure: VIDEO BRONCHOSCOPY USING DISPOSABLE ANESTHESIA SCOPE;  Surgeon: Prescott Gum, Collier Salina, MD;  Location: [place];  Service: Thoracic;  Laterality: N/A;   HPI:  56 yoM originally presented with SOB and fatigue at OHS found to have new HFrEF, NSTEMI, and  bilateral pleural effusions transferred to [facility] on 3/29 for further cardiac evaluation. 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 Started on lasix and milrinone gtts however developed NSVT. Was taken 4/1 for placement of IABP and swan for optimization prior to CABG. Was on precedex for agitation/ confusion, some concern for DTs. On the evening of 4/1, patient developed worsening respiratory distress and hypoxia, PCCM consulted for intubation and vent management. He was successfully extubated on 4/4 and PCCM signed off. Impella was removed 4/11 and patient noted to have increased WOB throughout the day he was placed on Bipap, but mental status continued to worsen requiring reintubation. Found pulseless 4/14 VT requiring 1 minute of CPR and epinephrine to achieve ROSC.  Eventual trach placement 4/16.    Assessment / Plan / Recommendation Clinical Impression  Pt seen for Passy-Muir speaking valve assessment, inline with ventilator requested RT switch mode from PSV to PRVC. Pt was calm, cooperative and tolerated cuff deflation by RT with mild tracheal/oral expectoration of secretions. RT adjusted ventilator setting with SLP guidance to ahcieve adequate oral/nasal emissions indicated by Vte settings, PEEP turned to 0. Immediate audible phonation initially low and increasing in intensity as phonatory/respiratory systems were better coordinated with min occasional verbal cues. HR and SpO2 remained within normal limits. RR varied from 31-low 40's. Throat clears and production of secretions intermittently throughout. His RR consistently remaining at 40 with signs of fatigue and increased work of breathing and evaluation completed. Pt's family is very involved and not present during time of SLP's and RT's availability for assessment. Family planning to arrive later morning and SLP gave consent for nurse (today only) and RT who is knowledgeable with inline PMV's can donn valve for brief period today to allow verbal  communication with family. Otherwise valve to be placed with ST and RT.        SLP Visit Diagnosis: Aphonia (R49.1)    SLP Assessment  Patient needs continued Speech Lanaguage Pathology Services    Follow Up Recommendations  LTACH    Frequency and Duration min 2x/week  2 weeks    PMSV Trial PMSV was placed for: 12-15 min Able to redirect subglottic air through upper airway: Yes Able to Attain Phonation: Yes Voice Quality: Low vocal intensity Able to Expectorate Secretions: Yes Level of Secretion Expectoration with PMSV: Oral;Tracheal Breath Support for Phonation: Mildly decreased Intelligibility: Intelligible Word: 75-100% accurate Phrase: 75-100% accurate Respirations During Trial: (31-low 40's) SpO2 During Trial: 98 % Pulse During Trial: 80 Behavior: Controlled;Cooperative;Responsive to questions   Tracheostomy Tube       Vent Dependency  Vent Dependent: Yes Vent Mode: (on psv-switched to prvc for eval ) PEEP: 5 cmH20 Pressure Support: 15 cmH20 FiO2 (%): 40 %    Cuff Deflation Trial  GO Tolerated Cuff Deflation: Yes Length of Time for Cuff Deflation Trial: 17 min Behavior: Alert;Cooperative;Responsive to questions;Controlled        Houston Siren 06/12/2019, 9:50 AM   Orbie Pyo Colvin Caroli.Ed Risk analyst [phone] Office [phone]

## 2019-06-12 NOTE — Progress Notes (Signed)
[facility] Progress Note   [facility] with [facility]B, new low EF and NSTEMI [facility]xd [facility] 3/29: 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 Admission Scr was 0.99.  Assessment/ Plan:   1. Acute hypoxic respiratory failure on 05/23/19- on vent via trach. S/p right chest tube. Oliguric/anuricAKI- CRRT started on 4/10 and stopped 4/29 w/ attempted transition to IHD on 05/17/19 but failed and required resumption of CRRT 1. Remains anuric. 2. Resumed CRRT on 05/23/19 following acute worsening of his oxygenation and hypotension.  3. Paused CRRT 5/23 with plans for trial of iHD to assess if he will tolerate.  Trial 5/25 with measures to help tolerate (low temp, albumin, UF profiling) was not successful 4. Resumed CRRT 5/26 with UF supported by vasopressor titration per CHF team  3. Vascular access-LIJ tunneledcatheter placed by IR on 05/19/19 4. Anemia of CKD, multifactorial-transfusions per primary team. Hemoglobin 7.8 today. Darbo 152mg started 5/11 > titrate to 150 now 5. CAD- 3 vessel with EF 25% s/p CABG and MVR 40/8/14complicated by cardiogenic shock as below 6. Acute systolic CHF and cardiogenic shock-  Dobutamine, NE, florinef, midodrine.  7. Electrolytes- K 5.8 this AM, was given lokelma and CRRT will correct.  F/u PM K as well.  8. MR s/p MVR 9. Severe debility  Plan is to continue current LOC until grandson graduation HS early 06/2019 then re-evaluate  Subjective:   Doing better after CRRT resumed yesterday, more alert.  Per RN PSV planned for later.    Objective:   BP 99/70   Pulse 84   Temp (!) 93.8 F (34.3 C) (Axillary)   Resp (!) 28   Ht _0  (1.803 m) Comment: measured x 3  Wt 70.1 kg   SpO2 99%   BMI 21.55 kg/m   Intake/Output Summary (Last 24 hours) at 06/12/2019 1452 Last data filed at 06/12/2019 1400 Gross per 24 hour  Intake  2596.78 ml  Output 4110 ml  Net -1513.22 ml   Weight change: 3 kg  Physical Exam: Gen: frail, thin, cachectic, awake in bed Neck: lef IJTunneled cath(placed 5/3) CVS: normal rate Resp:Coarse bilateral breath sounds, ventilated/trached, rt sided CT Abd: +BS, soft, NT/ND EGYJ:EHUDJ To 1+ lower extremity edema-atrophied legs  Imaging: DG Chest Port 1 View  Result Date: 06/12/2019 CLINICAL DATA:  Status post CABG EXAM: PORTABLE CHEST 1 VIEW COMPARISON:  06/11/2019 FINDINGS: Right chest tube in place without pneumothorax. Tracheostomy tube tip is above the carina. Large bore central venous catheter is identified with tip at the cavoatrial junction. There is a right arm PICC line with tip also at the cavoatrial junction. Feeding tube is in place. Stable cardiomediastinal silhouette. Diffuse bilateral airspace densities appear unchanged from the previous exam. IMPRESSION: 1. Stable support apparatus.  Right chest tube without pneumothorax. 2. No change in aeration to the lungs compared with previous exam. Electronically Signed   By: TKerby MoorsM.D.   On: 06/12/2019 08:41   DG Chest Port 1 View  Result Date: 06/11/2019 CLINICAL DATA:  Tracheostomy, chest tube, respiratory failure, open-heart surgery. EXAM: PORTABLE CHEST 1 VIEW COMPARISON:  06/09/2019 and CT chest 05/23/2019. FINDINGS: Tracheostomy is midline. Feeding tube is followed into the stomach with the tip projecting beyond the inferior margin of the image. Right PICC and left IJ dialysis catheter tips are in the right atrium. Right chest tube terminates at the apex of  the right hemithorax. Seven intact sternotomy wires. Cardiomediastinal silhouette is unchanged. Diffuse mixed interstitial and airspace opacification, minimally improved in the left upper lobe. Focal opacities in the right mid and lower lung zones, similar. Left costophrenic angle is not included on the image. There may be fluid in the right costophrenic angle. No  definite pneumothorax. IMPRESSION: 1. Diffuse bilateral mixed interstitial and airspace opacification with slight improvement in the left upper lobe. Findings may be due to pulmonary edema or acute respiratory distress syndrome in the appropriate clinical setting. 2. Probable right pleural effusion. 3. No definite pneumothorax with right chest tube in place. Electronically Signed   By: Lorin Picket M.D.   On: 06/11/2019 09:22    Labs: BMET Recent Labs  Lab 06/09/19 0301 06/09/19 0301 06/09/19 5320 06/09/19 2334 06/09/19 1627 06/10/19 0330 06/10/19 1730 06/11/19 0330 06/11/19 0921 06/11/19 1839 06/12/19 0324  NA 131*   < > 130*   < > 131* 129* 127* 126* 128* 128* 131*  K 5.3*   < > 5.2*   < > 5.4* 5.7* 5.4* 5.8* 6.0* 5.6* 5.8*  CL 100   < > 98  --  99 94* 93* 93*  --  96* 94*  CO2 26   < > 25  --  _0 --  26 28  GLUCOSE 298*   < > 94  --  184* 236* 283* 267*  --  198* 119*  BUN 16   < > 19  --  25* 35* 37* 42*  --  36* 35*  CREATININE 1.36*   < > 1.55*  --  1.73* 2.04* 2.22* 2.50*  --  1.96* 1.62*  CALCIUM 8.4*   < > 8.5*  --  8.3* 8.5* 8.5* 8.5*  --  8.1* 8.4*  PHOS 2.1*  --   --   --  2.7 3.0 3.3 3.6  --  3.3 2.8   < > = values in this interval not displayed.   CBC Recent Labs  Lab 06/09/19 0301 06/09/19 0301 06/10/19 0901 06/11/19 0330 06/11/19 0921 06/12/19 0324  WBC 4.9  --  5.8 4.8  --  3.6*  HGB 8.0*   < > 8.2* 7.8* 8.5* 7.8*  HCT 26.0*   < > 27.0* 25.3* 25.0* 25.4*  MCV 96.3  --  96.4 95.8  --  95.5  PLT 88*  --  112* 114*  --  111*   < > = values in this interval not displayed.    Medications:    . aspirin EC  325 mg Oral Daily   Or  . aspirin  324 mg Per Tube Daily  . B-complex with vitamin C  1 tablet Per Tube Daily  . chlorhexidine gluconate (MEDLINE KIT)  15 mL Mouth Rinse BID  . citalopram  10 mg Per Tube Daily  . collagenase   Topical Daily  . [START ON 06/17/2019] darbepoetin (ARANESP) injection - NON-DIALYSIS  150 mcg Subcutaneous Q  Tue-1800  . feeding supplement (PRO-STAT SUGAR FREE 64)  30 mL Per Tube BID  . feeding supplement (VITAL 1.5 CAL)  1,000 mL Per Tube Q24H  . fentaNYL  1 patch Transdermal Q72H  . fludrocortisone  0.2 mg Oral Daily  . gabapentin  100 mg Oral QHS  . insulin aspart  0-6 Units Subcutaneous Q4H  . mouth rinse  15 mL Mouth Rinse 10 times per day  . melatonin  3 mg Per Tube QHS  . metoCLOPramide (REGLAN) injection  10 mg Intravenous Q6H  . midodrine  20 mg Per Tube Q8H  . nutrition supplement (JUVEN)  1 packet Per Tube BID  . pantoprazole sodium  40 mg Per Tube BID  . QUEtiapine  25 mg Oral QHS  . rOPINIRole  0.5 mg Oral QHS  . sodium chloride flush  10-40 mL Intracatheter Q12H    Jannifer Hick MD [facility] Pager [phone]

## 2019-06-12 NOTE — Progress Notes (Signed)
NAME:  Brendan [name], MRN:  [id number], DOB:  [birth year], LOS: 4 ADMISSION DATE:  04/06/2019, CONSULTATION DATE:  04/17/19 REFERRING MD:  Dr. Haroldine Laws, CHIEF COMPLAINT:  Respiratory distress   Brief History   39 yoM originally presented with SOB and fatigue at [facility] found to have new HFrEF, NSTEMI, and bilateral pleural effusions transferred to [facility] on 3/29 for further cardiac evaluation. 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 Started on lasix and milrinone gtts however developed NSVT. Was taken 4/1 for placement of IABP and swan for optimization prior to CABG. Was on precedex for agitation/ confusion, some concern for DTs. On the evening of 4/1, patient developed worsening respiratory distress and hypoxia, PCCM consulted for intubation and vent management.  Patient was extubated on 4/14 and developed worsening hypoxia on BiPAP. Overnight he developed pulseless VT requiring 1 minute of CPR and epinephrine to achieve ROSC. Neurovascularly intact afterward. Subsequently had increased work of breathing and worsening respiratory distress requiring intubation. Patient had tracheostomy performed on 05/11/2019. Trying to wean off ventilator.   Past Medical History  Tobacco abuse, HTN, poorly controlled diabetes, diabetic neuropathy  Significant Hospital Events   3/30 Lt heart cath/TEE performed 4/1 Intubated, RHC/IABP/PA cath 4/2 Impella placement 4/3 febrile over evening and night. Blood cultures obtained. Vanc/cefepime started. Hematuria--heparin held. 4/4 Extubated,abx transitioned to rocephin. Heparin restarted 4/6 Re-intubated for CABG/MVR 4/7 Extubated from CABG to BiPAP 4/9 Impella removed  4/9 TEE 4/10 CVC inserted &CRRT initiated  4/12 PCCMre-consulted re-intubation 4/14 Extubated to BiPAP 4/15 Re-intubated due to respiratory distress 4/16 Tracheostomy 4/27 bronch with BAL 5/7 called back for hypercarbic failure. #6 cuffed placed. Right chest tube placed for enlarging  effusion. 5/15>> remains on vent, CVVHD  Consults:  TCTS PCCM HF Nephrology  Procedures:  R cephalic double lumen PICC LIJ tunneled HD catheter L brachial arterial line 6-0 cuffed shiley  Significant Diagnostic Tests:  3/30 R/ LHC >>  Ost LM to Mid LM lesion is 35% stenosed.  Prox LAD lesion is 90% stenosed.  Prox LAD to Mid LAD lesion is 50% stenosed.  Mid Cx lesion is 100% stenosed.  Prox RCA to Mid RCA lesion is 90% stenosed.  RPDA lesion is 95% stenosed.  LV end diastolic pressure is severely elevated.  Hemodynamic findings consistent with moderate pulmonary hypertension. 1. Severe 3 vessel obstructive CAD 2. High LV filling pressures 3. Reduced cardiac output with index 2.3 4. Moderate pulmonary HTN.  3/30TTE >> 1. Left ventricular ejection fraction, by estimation, is 30 to 35%. The left ventricle has moderately decreased function. The left ventricle demonstrates regional wall motion abnormalities.There is mild left ventricular hypertrophy. Left ventricular diastolic function could not be evaluated. There is severe hypokinesis of the left ventricular, entire inferior wall, inferoseptal wall, apical segment and lateral wall.  2. Right ventricular systolic function is hyperdynamic. The right ventricular size is normal. There is moderately elevated pulmonary artery systolic pressure.  3. Decreased posterior leaflet motion due to ischemic tethering of the mitral valve leaflets.  4. The mitral valve is abnormal. Moderate to severe mitral valve regurgitation.  5. The aortic valve is tricuspid. Aortic valve regurgitation is not visualized. Mild aortic valve sclerosis is present, with no evidence of aortic valve stenosis.  6. The inferior vena cava is normal in size with greater than 50% respiratory variability, suggesting right atrial pressure of 3 mmHg.   4/1 CT chest w/o >>Large bilateral pleural effusions with associated atelectasis.Moderate to severe  bilateral ground-glass opacities, likely reflecting pneumonia and/or edema.  4/1 RHC >> RA = 18 RV = 60/20  PA = 63/29 (42) PCW = 33 Fick cardiac output/index = 3.7/1.9 PVR = 2.5 WU FA sat = 98% PA sat = 47%, 49% PaPi = 2.2   Micro Data:  See Epic micro tab  Antimicrobials:    Interim history/subjective:  Mental status and breathing status improved with CRRT. Remains on vent.  Objective   Blood pressure 95/62, pulse 79, temperature (!) 93.8 F (34.3 C), temperature source Axillary, resp. rate 20, height _0  (1.803 m), weight 70.1 kg, SpO2 100 %. CVP:  [5 mmHg-24 mmHg] 10 mmHg  Vent Mode: PSV;CPAP FiO2 (%):  [40 %] 40 % Set Rate:  [25 bmp] 25 bmp Vt Set:  [600 mL] 600 mL PEEP:  [5 cmH20] 5 cmH20 Pressure Support:  [15 cmH20] 15 cmH20 Plateau Pressure:  [17 cmH20-31 cmH20] 31 cmH20   Intake/Output Summary (Last 24 hours) at 06/12/2019 0804 Last data filed at 06/12/2019 0700 Gross per 24 hour  Intake 2311.46 ml  Output 3570 ml  Net -1258.54 ml   Filed Weights   06/10/19 1215 06/11/19 0445 06/12/19 0500  Weight: 67.3 kg 69.3 kg 70.1 kg    Examination:   GEN: Cachectic elderly debilitated gentleman, on mechanical ventilator HEENT: Tracheostomy tube in place, clean dry and intact, thick tan secretions CV: Regular rate rhythm, S1-S2, ext warm PULM: Coarse bilateral ventilated breath sounds GI: Soft nontender nondistended, +BS, NGT in place EXT: No significant edema, + muscle wasting NEURO: awake, moves all 4 ext but not to command PSYCH: RASS 0 SKIN: Multiple areas of skin breakdown   Sugars okay off d10 drip Sodium better Ongoing pancytopenia CXR continued bilateral severe airspace disease, lines and tube in appropriate positions  Resolved Problems   Agitated delirium- improved - seroquel qHS, continue melatonin - continue PRN oxycodone - re-orient, encourage day/night cycles  Hypoglycemia, resolved -Continue tube feeds, continue to  monitor  Constipation/colonic ileus - sorbitol PRN - Continue senna and miralax  Assessment & Plan:   Acute on chronic hypoxemic and hypercarbic respiratory failure after CABG now s/p trach. Multifactorial etiology. Treated Enterobacter pneumonia s/p antibiotics. Persistent diffuse interstitial and airspace process on CXR 5/18 in setting of CHF and severe protein calorie malnutrition manifested by resultant pulmonary edema and pleural effusions. Plan: -PS today, if does well can consider TC -Chest tube management per primary  Toxic metabolic encephalopathy- recheck after CRRT runs for a day or two  Shock- likely multifactorial etiology in setting of acute systolic CHF 2/2 iCM (most recent 5/13 EF 35-40%) s/p CABG/MVR 4/6 and cardiogenic shock. Most likely also has distributive component in setting of protein calorie malnutrition resulting in low oncotic pressure -> significant third spacing.  -Continue dobutamine, florinef, levophed, midodrine  ESRD: oliguric/anuric AKI in setting of multifactorial shock as described above. CRRT started on 4/10 and stopped 4/29 with failed attempt to transition to iHD on 5/1. Now back on CRRT on 5/7 2/2 acute worsening of oxygenation and hypotension. He currently remains anuric. -CRRT with pull per TCTS and nephrology discussion   Dysphagia- -Likely need PEG tube at some point depending on goals of care  Profound muscular deconditioning -PT OT  Pancytopenia- stable, monitor  Goals of care-continue current level of care, to revisit 06/18/19 after his grandson graduates high school  Best practice:  Diet: Tube feeds Pain/Anxiety/Delirium protocol (if indicated): see above VAP protocol (if indicated):HOB 30 degrees  DVT prophylaxis: ASA 388m, SCDs GI prophylaxis: PPI Glucose control:SSI Mobility: BR Code  Status: Full  Family Communication:per primary team Disposition:ICU pending CRRT and vent liberation    The patient is critically ill  with multiple organ systems failure and requires high complexity decision making for assessment and support, frequent evaluation and titration of therapies, application of advanced monitoring technologies and extensive interpretation of multiple databases. Critical Care Time devoted to patient care services described in this note independent of APP/resident time (if applicable)  is 31 minutes.   Erskine Emery MD [facility] 06/12/2019 8:08 AM Personal pager: [phone] If unanswered, please page CCM On-call: [phone]   Erskine Emery MD PCCM

## 2019-06-12 NOTE — Progress Notes (Signed)
Palliative:  HPI: Palliative Care consult requested for goals of care discussion in this63 y.o.malewith multiple medical problems including poorly controlled diabetes complicated by neuropathy and hypertension. Patient presented to outside hospital on 3/26 and transferred to [facility] for further evaluation due to elevated troponin and newly depressed EF. During his initial work-up patient was found to have a blood sugar of 516, BUN53, creatinine 1.2, and anion gap of 17. Patient's BNP was elevated to 8900. Troponin was elevated to 13.1 with changes noted by the team of their lateral leads as well as V1 and V2.Chest x-ray showed concerns for bilateral pulmonary opacities. Patient was initiated on aspirin, heparin drip, and IV antibiotics. Recent echo showed EF of 25%. Since admission patient required intubation on 4/1, extubated on 4/4, re-intubated 4/6 for CABG 4/6, extubated post procedure 4/7, CRRT initiated on 4/10 with brief transition to iHD and CRRT began again 5/7, required re-intubation on 4/12 due to respiratory failure, tracheostomy performed on 4/16, 5/7 right chest tube placed due to enlarging effusion. Continues to require midodrine and pressorsas well as ventilator support.CRRT holiday with trial of iHD planned for 5/25. Failed trial of iHD  5/25 due to hypotension and CRRT restarted 5/26.   I met today at Mr. Aliano bedside with wife Cecille Rubin and daughter Lawerance Bach. He is restless but awakens and is more oriented and nodding appropriately to questions. They have had a very good day as he was able to use inline speaking valve and he was able to speak with them. They reports such excitement in being able to hear his voice as it has been a very long time. He was also able to speak with grandson via FaceTime which was important to all family. Of course he is exhausted after this busy morning of speaking x 2 and sitting up in recliner position in chair. Still requiring Levophed 8 mg/hr. He is  resting comfortably after repositioning.   No changes in goals of care. Awaiting grandson's high school graduation. All questions/concerns addressed. Emotional support provided.   Exam: More alert and oriented today. A little restless. Thin, frail. HR regular 80s. Breathing regular, unlabored via trach to vent. Abd flat. Edema much improved. TED hose to BLE. Generalized weakness.   Plan: - Awaiting grandson's high school graduation June 2nd 7 pm. Reassess goals of care June 3rd.   [place], NP Palliative Medicine Team Pager [phone] (Please see [email or web address] for schedule) Team Phone [phone]    Greater than 50%  of this time was spent counseling and coordinating care related to the above assessment and plan

## 2019-06-12 NOTE — Progress Notes (Signed)
      [address]       [place] [address]             [phone]      Sleeping currently  BP (!) 88/72   Pulse 85   Temp (!) 97.5 F (36.4 C)   Resp 18   Ht 5\' 11"  (1.803 m) Comment: measured x 3  Wt 70.1 kg   SpO2 100%   BMI 21.55 kg/m    Intake/Output Summary (Last 24 hours) at 06/12/2019 1804 Last data filed at 06/12/2019 1700 Gross per 24 hour  Intake 2227.98 ml  Output 4014 ml  Net -1786.02 ml   No new issues today  Remo Lipps C. Roxan Hockey, MD [facility] [phone]

## 2019-06-12 NOTE — Progress Notes (Addendum)
TCTS DAILY ICU PROGRESS NOTE                   [address]            [place] [address]          [phone]   41 Days Post-Op Procedure(s) (LRB): VIDEO BRONCHOSCOPY USING DISPOSABLE ANESTHESIA SCOPE (N/A) TRACHEOSTOMY (N/A)  Total Length of Stay:  LOS: 59 days   Subjective: Patient on CRRT and opened his eyes briefly this am  Objective: Vital signs in last 24 hours: Temp:  [93.6 F (34.2 C)-97.9 F (36.6 C)] 93.8 F (34.3 C) (05/27 0400) Pulse Rate:  [62-82] 75 (05/27 0730) Cardiac Rhythm: Normal sinus rhythm (05/27 0400) Resp:  [14-35] 24 (05/27 0730) BP: (73-121)/(34-97) 95/62 (05/27 0730) SpO2:  [84 %-100 %] 100 % (05/27 0730) FiO2 (%):  [40 %] 40 % (05/27 0415) Weight:  [70.1 kg] 70.1 kg (05/27 0500)  Filed Weights   06/10/19 1215 06/11/19 0445 06/12/19 0500  Weight: 67.3 kg 69.3 kg 70.1 kg    Weight change: 3 kg   CVP:  [5 mmHg-24 mmHg] 10 mmHg  Intake/Output from previous day: 05/26 0701 - 05/27 0700 In: 3153.2 [I.V.:571.2; NG/GT:2582] Out: 3832 [Stool:500; Chest Tube:20]  Intake/Output this shift: No intake/output data recorded.  Current Meds: Scheduled Meds: . aspirin EC  325 mg Oral Daily   Or  . aspirin  324 mg Per Tube Daily  . B-complex with vitamin C  1 tablet Per Tube Daily  . chlorhexidine gluconate (MEDLINE KIT)  15 mL Mouth Rinse BID  . citalopram  10 mg Per Tube Daily  . collagenase   Topical Daily  . [START ON 06/17/2019] darbepoetin (ARANESP) injection - NON-DIALYSIS  150 mcg Subcutaneous Q Tue-1800  . docusate  200 mg Per Tube Daily  . feeding supplement (PRO-STAT SUGAR FREE 64)  30 mL Per Tube BID  . feeding supplement (VITAL 1.5 CAL)  1,000 mL Per Tube Q24H  . fentaNYL  1 patch Transdermal Q72H  . fludrocortisone  0.2 mg Oral Daily  . gabapentin  100 mg Oral QHS  . insulin aspart  0-6 Units Subcutaneous Q4H  . mouth rinse  15 mL Mouth Rinse 10 times per day  . melatonin  3 mg Per Tube QHS  . metoCLOPramide  (REGLAN) injection  10 mg Intravenous Q6H  . midodrine  20 mg Per Tube Q8H  . nutrition supplement (JUVEN)  1 packet Per Tube BID  . pantoprazole sodium  40 mg Per Tube BID  . polyethylene glycol  17 g Oral Daily  . QUEtiapine  25 mg Oral QHS  . rOPINIRole  0.5 mg Oral QHS  . sennosides  5 mL Per Tube BID  . sodium chloride flush  10-40 mL Intracatheter Q12H  . sorbitol  15 mL Oral q morning - 10a   Continuous Infusions: .  prismasol BGK 4/2.5 500 mL/hr at 06/12/19 0441  .  prismasol BGK 4/2.5 300 mL/hr at 06/12/19 0127  . sodium chloride 250 mL (06/10/19 1053)  . sodium chloride Stopped (05/31/19 0958)  . dextrose 5 % and 0.9% NaCl Stopped (06/06/19 0513)  . DOBUTamine 5 mcg/kg/min (06/12/19 0700)  . heparin 999 mL/hr at 06/11/19 0836  . norepinephrine (LEVOPHED) Adult infusion 6 mcg/min (06/12/19 0700)  . prismasol BGK 4/2.5 1,000 mL/hr at 06/12/19 0518   PRN Meds:.sodium chloride, alum & mag hydroxide-simeth, dextrose, diphenhydrAMINE, fentaNYL (SUBLIMAZE) injection, Gerhardt's butt cream, heparin, heparin, heparin, levalbuterol,  ondansetron (ZOFRAN) IV, pneumococcal 23 valent vaccine, sodium chloride flush, traMADol  Heart: RRR Lungs: Coarse breath sounds bilaterally Abdomen: Soft, bowel sounds present Extremities: Boots in place Wound: Clean and dry.  Right chest tube: Little to no drainage of late  Lab Results: CBC: Recent Labs    06/11/19 0330 06/11/19 0330 06/11/19 0921 06/12/19 0324  WBC 4.8  --   --  3.6*  HGB 7.8*   < > 8.5* 7.8*  HCT 25.3*   < > 25.0* 25.4*  PLT 114*  --   --  111*   < > = values in this interval not displayed.   BMET:  Recent Labs    06/11/19 1839 06/12/19 0324  NA 128* 131*  K 5.6* 5.8*  CL 96* 94*  CO2 26 28  GLUCOSE 198* 119*  BUN 36* 35*  CREATININE 1.96* 1.62*  CALCIUM 8.1* 8.4*    CMET: Lab Results  Component Value Date   WBC 3.6 (L) 06/12/2019   HGB 7.8 (L) 06/12/2019   HCT 25.4 (L) 06/12/2019   PLT 111 (L)  06/12/2019   GLUCOSE 119 (H) 06/12/2019   CHOL 174 08/21/2017   TRIG 143 08/21/2017   HDL 57 08/21/2017   LDLCALC 88 08/21/2017   ALT 14 06/06/2019   AST 24 06/06/2019   NA 131 (L) 06/12/2019   K 5.8 (H) 06/12/2019   CL 94 (L) 06/12/2019   CREATININE 1.62 (H) 06/12/2019   BUN 35 (H) 06/12/2019   CO2 28 06/12/2019   TSH 0.925 04/04/2019   INR 1.6 (H) 05/01/2019   HGBA1C 9.0 (H) 04/07/2019   MICROALBUR 30 08/21/2017    PT/INR:  No results for input(s): LABPROT, INR in the last 72 hours. Radiology: No results found.  Assessment/Plan: S/P Procedure(s) (LRB): VIDEO BRONCHOSCOPY USING DISPOSABLE ANESTHESIA SCOPE (N/A) TRACHEOSTOMY (N/A)  1. CV-S/p removal of Impella on 04/09.  S/p V tach arrest 04/15. Previous a fib. SR with HR in the 80's this am.  On  Midodrine 20 mg tid and Nor epinephrine 6 mcg/min, and Dobutamine drip 5 mcg/kg/min. Co ox this am decreased to 53..8  2. Pulmonary-S/p trach 04/16.  Right chest tube with no recorded ouptut last 24 hours. CXR this am appears to show severe, diffuse bilateral airspace disease. 3. Expected post op blood loss anemia-H and H this am stable at  7.8 and 25.4  Has had transfusions previously. Continue Aranesp 4. DM-CBGs 156/113/111 . He was on Metformin 1000 mg bid prior to surgery, but will continue on Insulin for now as NPO, TFs. Pre op HGA1C 9.  5. Oliguric/anuric AKI-Creatinine decreased to 1.62 this am. He had CRRT yesterday. Nephrology following and arranging for CRRT accordingly.  6. GI-severe malnutrition of chronic illness.  Cortrak, TFs at 60 ml/hr. Speech pathology placed post pyloric Cortrak recently as existing Cortak stopped functioning 7. Acute systolic heart failure-CVVHD helping with volume status 8. Extremely deconditioned-will need PT as able. Per family wishes, continue full support 9. Hyponatremia improved this am as sodium increased to [place] PA-C 06/12/2019 7:36 AM    Patient appears more  comfortable after reinitiation of CRT Remains on low-dose dobutamine and norepinephrine Chest x-ray with stable lateral interstitial edema without significant effusion.  Right chest tube in place Continue current support to allow patient to participate in an port and family event week.   patient examined and medical record reviewed,agree with above note. [name] 06/12/2019

## 2019-06-13 LAB — GLUCOSE, CAPILLARY
Glucose-Capillary: 120 mg/dL — ABNORMAL HIGH (ref 70–99)
Glucose-Capillary: 130 mg/dL — ABNORMAL HIGH (ref 70–99)
Glucose-Capillary: 132 mg/dL — ABNORMAL HIGH (ref 70–99)
Glucose-Capillary: 145 mg/dL — ABNORMAL HIGH (ref 70–99)
Glucose-Capillary: 154 mg/dL — ABNORMAL HIGH (ref 70–99)
Glucose-Capillary: 156 mg/dL — ABNORMAL HIGH (ref 70–99)
Glucose-Capillary: 66 mg/dL — ABNORMAL LOW (ref 70–99)

## 2019-06-13 LAB — MAGNESIUM: Magnesium: 2.5 mg/dL — ABNORMAL HIGH (ref 1.7–2.4)

## 2019-06-13 LAB — COOXEMETRY PANEL
Carboxyhemoglobin: 1.5 % (ref 0.5–1.5)
Methemoglobin: 0.9 % (ref 0.0–1.5)
O2 Saturation: 58.7 %
Total hemoglobin: 8.8 g/dL — ABNORMAL LOW (ref 12.0–16.0)

## 2019-06-13 LAB — RENAL FUNCTION PANEL
Albumin: 2.2 g/dL — ABNORMAL LOW (ref 3.5–5.0)
Albumin: 2.1 g/dL — ABNORMAL LOW (ref 3.5–5.0)
Anion gap: 3 — ABNORMAL LOW (ref 5–15)
Anion gap: 8 (ref 5–15)
BUN: 34 mg/dL — ABNORMAL HIGH (ref 8–23)
BUN: 36 mg/dL — ABNORMAL HIGH (ref 8–23)
CO2: 29 mmol/L (ref 22–32)
CO2: 27 mmol/L (ref 22–32)
Calcium: 8.4 mg/dL — ABNORMAL LOW (ref 8.9–10.3)
Calcium: 8 mg/dL — ABNORMAL LOW (ref 8.9–10.3)
Chloride: 99 mmol/L (ref 98–111)
Chloride: 96 mmol/L — ABNORMAL LOW (ref 98–111)
Creatinine, Ser: 1.33 mg/dL — ABNORMAL HIGH (ref 0.61–1.24)
Creatinine, Ser: 1.24 mg/dL (ref 0.61–1.24)
GFR calc Af Amer: >60 mL/min (ref >60)
GFR calc Af Amer: >60 mL/min (ref >60)
GFR calc non Af Amer: 57 mL/min — ABNORMAL LOW (ref >60)
GFR calc non Af Amer: >60 mL/min (ref >60)
Glucose, Bld: 168 mg/dL — ABNORMAL HIGH (ref 70–99)
Glucose, Bld: 184 mg/dL — ABNORMAL HIGH (ref 70–99)
Phosphorus: 2.3 mg/dL — ABNORMAL LOW (ref 2.5–4.6)
Phosphorus: 3.3 mg/dL (ref 2.5–4.6)
Potassium: 5.2 mmol/L — ABNORMAL HIGH (ref 3.5–5.1)
Potassium: 5.2 mmol/L — ABNORMAL HIGH (ref 3.5–5.1)
Sodium: 131 mmol/L — ABNORMAL LOW (ref 135–145)
Sodium: 131 mmol/L — ABNORMAL LOW (ref 135–145)

## 2019-06-13 LAB — POCT I-STAT 7, (LYTES, BLD GAS, ICA,H+H)
Acid-Base Excess: 5 mmol/L — ABNORMAL HIGH (ref 0.0–2.0)
Bicarbonate: 30.7 mmol/L — ABNORMAL HIGH (ref 20.0–28.0)
Calcium, Ion: 1.22 mmol/L (ref 1.15–1.40)
HCT: 24 % — ABNORMAL LOW (ref 39.0–52.0)
Hemoglobin: 8.2 g/dL — ABNORMAL LOW (ref 13.0–17.0)
O2 Saturation: 98 %
Patient temperature: 97.5
Potassium: 5.1 mmol/L (ref 3.5–5.1)
Sodium: 134 mmol/L — ABNORMAL LOW (ref 135–145)
TCO2: 32 mmol/L (ref 22–32)
pCO2 arterial: 48.4 mmHg — ABNORMAL HIGH (ref 32.0–48.0)
pH, Arterial: 7.408 (ref 7.350–7.450)
pO2, Arterial: 96 mmHg (ref 83.0–108.0)

## 2019-06-13 LAB — CBC
HCT: 25.6 % — ABNORMAL LOW (ref 39.0–52.0)
Hemoglobin: 7.8 g/dL — ABNORMAL LOW (ref 13.0–17.0)
MCH: 29.8 pg (ref 26.0–34.0)
MCHC: 30.5 g/dL (ref 30.0–36.0)
MCV: 97.7 fL (ref 80.0–100.0)
Platelets: 129 10*3/uL — ABNORMAL LOW (ref 150–400)
RBC: 2.62 MIL/uL — ABNORMAL LOW (ref 4.22–5.81)
RDW: 15.7 % — ABNORMAL HIGH (ref 11.5–15.5)
WBC: 5.4 10*3/uL (ref 4.0–10.5)
nRBC: 0 % (ref 0.0–0.2)

## 2019-06-13 MED ORDER — ORAL CARE MOUTH RINSE
15.0000 mL | OROMUCOSAL | Status: DC
  Administered 2019-06-13 – 2019-06-21 (×60): 15 mL via OROMUCOSAL

## 2019-06-13 MED ORDER — COLLAGENASE 250 UNIT/GM EX OINT
TOPICAL_OINTMENT | Freq: Every day | CUTANEOUS | Status: AC
  Administered 2019-06-13 – 2019-06-16 (×4): 1 via TOPICAL
  Filled 2019-06-13: qty 30

## 2019-06-13 MED ORDER — SODIUM PHOSPHATES 45 MMOLE/15ML IV SOLN
10.0000 mmol | Freq: Once | INTRAVENOUS | Status: AC
  Administered 2019-06-13: 10 mmol via INTRAVENOUS
  Filled 2019-06-13: qty 3.33

## 2019-06-13 NOTE — Progress Notes (Addendum)
NAME:  Brendan [name], MRN:  [id number], DOB:  12-26-1955, LOS: 89 ADMISSION DATE:  04/13/2019, CONSULTATION DATE:  04/17/19 REFERRING MD:  Dr. Haroldine Laws, CHIEF COMPLAINT:  Respiratory distress   Brief History   82 yoM originally presented with SOB and fatigue at [facility] found to have new HFrEF, NSTEMI, and bilateral pleural effusions transferred to [facility] on 3/29 for further cardiac evaluation. 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 Started on lasix and milrinone gtts however developed NSVT. Was taken 4/1 for placement of IABP and swan for optimization prior to CABG. Was on precedex for agitation/ confusion, some concern for DTs. On the evening of 4/1, patient developed worsening respiratory distress and hypoxia, PCCM consulted for intubation and vent management.  Patient was extubated on 4/14 and developed worsening hypoxia on BiPAP. Overnight he developed pulseless VT requiring 1 minute of CPR and epinephrine to achieve ROSC. Neurovascularly intact afterward. Subsequently had increased work of breathing and worsening respiratory distress requiring intubation. Patient had tracheostomy performed on 05/04/2019. Trying to wean off ventilator.   Past Medical History  Tobacco abuse, HTN, poorly controlled diabetes, diabetic neuropathy  Significant Hospital Events   3/30 Lt heart cath/TEE performed 4/1 Intubated, RHC/IABP/PA cath 4/2 Impella placement 4/3 febrile over evening and night. Blood cultures obtained. Vanc/cefepime started. Hematuria--heparin held. 4/4 Extubated,abx transitioned to rocephin. Heparin restarted 4/6 Re-intubated for CABG/MVR 4/7 Extubated from CABG to BiPAP 4/9 Impella removed  4/9 TEE 4/10 CVC inserted &CRRT initiated  4/12 PCCMre-consulted re-intubation 4/14 Extubated to BiPAP 4/15 Re-intubated due to respiratory distress 4/16 Tracheostomy 4/27 bronch with BAL 5/7 called back for hypercarbic failure. #6 cuffed placed. Right chest tube placed for enlarging  effusion. 5/15>> remains on vent, CVVHD  Consults:  TCTS PCCM HF Nephrology  Procedures:  R cephalic double lumen PICC LIJ tunneled HD catheter L brachial arterial line 6-0 cuffed shiley  Significant Diagnostic Tests:  3/30 R/ LHC >>  Ost LM to Mid LM lesion is 35% stenosed.  Prox LAD lesion is 90% stenosed.  Prox LAD to Mid LAD lesion is 50% stenosed.  Mid Cx lesion is 100% stenosed.  Prox RCA to Mid RCA lesion is 90% stenosed.  RPDA lesion is 95% stenosed.  LV end diastolic pressure is severely elevated.  Hemodynamic findings consistent with moderate pulmonary hypertension. 1. Severe 3 vessel obstructive CAD 2. High LV filling pressures 3. Reduced cardiac output with index 2.3 4. Moderate pulmonary HTN.  3/30TTE >> 1. Left ventricular ejection fraction, by estimation, is 30 to 35%. The left ventricle has moderately decreased function. The left ventricle demonstrates regional wall motion abnormalities.There is mild left ventricular hypertrophy. Left ventricular diastolic function could not be evaluated. There is severe hypokinesis of the left ventricular, entire inferior wall, inferoseptal wall, apical segment and lateral wall.  2. Right ventricular systolic function is hyperdynamic. The right ventricular size is normal. There is moderately elevated pulmonary artery systolic pressure.  3. Decreased posterior leaflet motion due to ischemic tethering of the mitral valve leaflets.  4. The mitral valve is abnormal. Moderate to severe mitral valve regurgitation.  5. The aortic valve is tricuspid. Aortic valve regurgitation is not visualized. Mild aortic valve sclerosis is present, with no evidence of aortic valve stenosis.  6. The inferior vena cava is normal in size with greater than 50% respiratory variability, suggesting right atrial pressure of 3 mmHg.   4/1 CT chest w/o >>Large bilateral pleural effusions with associated atelectasis.Moderate to severe  bilateral ground-glass opacities, likely reflecting pneumonia and/or edema.  4/1 RHC >> RA = 18 RV = 60/20  PA = 63/29 (42) PCW = 33 Fick cardiac output/index = 3.7/1.9 PVR = 2.5 WU FA sat = 98% PA sat = 47%, 49% PaPi = 2.2   Micro Data:  See Epic micro tab  Antimicrobials:    Interim history/subjective:  No events, more delirious today.  Objective   Blood pressure (!) 121/93, pulse 84, temperature (!) 97.5 F (36.4 C), temperature source Oral, resp. rate (!) 21, height _0  (1.803 m), weight 66.2 kg, SpO2 99 %. CVP:  [6 mmHg-17 mmHg] 9 mmHg  Vent Mode: CPAP;PSV FiO2 (%):  [40 %] 40 % Set Rate:  [25 bmp] 25 bmp Vt Set:  [600 mL] 600 mL PEEP:  [5 cmH20] 5 cmH20 Pressure Support:  [15 cmH20] 15 cmH20 Plateau Pressure:  [26 cmH20-33 cmH20] 30 cmH20   Intake/Output Summary (Last 24 hours) at 06/13/2019 0758 Last data filed at 06/13/2019 0700 Gross per 24 hour  Intake 2690.04 ml  Output 4731 ml  Net -2040.96 ml   Filed Weights   06/11/19 0445 06/12/19 0500 06/13/19 0400  Weight: 69.3 kg 70.1 kg 66.2 kg    Examination:   GEN: Cachectic elderly debilitated gentleman, on mechanical ventilator HEENT: Tracheostomy tube in place, clean dry and intact, thick tan secretions CV: Regular rate rhythm, S1-S2, ext warm PULM: Coarse bilateral ventilated breath sounds GI: Soft nontender nondistended, +BS, NGT in place EXT: No significant edema, + muscle wasting NEURO: awake, moves ext to command but agitated PSYCH: RASS +1 SKIN: Multiple areas of skin breakdown   Sugars okay off d10 drip Sodium better Ongoing pancytopenia No CXR  Resolved Problems   Agitated delirium- improved - seroquel qHS, continue melatonin - continue PRN oxycodone - re-orient, encourage day/night cycles  Hypoglycemia, resolved -Continue tube feeds, continue to monitor  Constipation/colonic ileus - sorbitol PRN - Continue senna and miralax  Assessment & Plan:   Acute on chronic  hypoxemic and hypercarbic respiratory failure after CABG now s/p trach. Multifactorial etiology. Treated Enterobacter pneumonia s/p antibiotics. Persistent diffuse interstitial and airspace process on CXR 5/18 in setting of CHF and severe protein calorie malnutrition manifested by resultant pulmonary edema and pleural effusions. Plan: -PS today, if does well can consider TC -Chest tube management per primary  Toxic metabolic encephalopathy- re-orient as able, lights on during day - Encourage family visits - Continue current Rx  Shock- likely multifactorial etiology in setting of acute systolic CHF 2/2 iCM (most recent 5/13 EF 35-40%) s/p CABG/MVR 4/6 and cardiogenic shock. Most likely also has distributive component in setting of protein calorie malnutrition resulting in low oncotic pressure -> significant third spacing.  -Continue dobutamine, florinef, levophed, midodrine  ESRD: oliguric/anuric AKI in setting of multifactorial shock as described above. CRRT started on 4/10 and stopped 4/29 with failed attempt to transition to iHD on 5/1. Now back on CRRT on 5/7 2/2 acute worsening of oxygenation and hypotension. He currently remains anuric. -CRRT with pull per TCTS and nephrology discussion  Dysphagia- -Likely need PEG tube at some point depending on goals of care  Profound muscular deconditioning -PT OT  Pancytopenia- stable, monitor  Goals of care-continue current level of care, to revisit 06/18/19 after his grandson graduates high school  Best practice:  Diet: Tube feeds Pain/Anxiety/Delirium protocol (if indicated): see above VAP protocol (if indicated):HOB 30 degrees  DVT prophylaxis: ASA 365m, SCDs GI prophylaxis: PPI Glucose control:SSI Mobility: BR Code Status: Full  Family Communication:per primary team Disposition:ICU pending CRRT and  vent liberation  We will check on again Monday, call if any issues over weekend  The patient is critically ill with multiple organ  systems failure and requires high complexity decision making for assessment and support, frequent evaluation and titration of therapies, application of advanced monitoring technologies and extensive interpretation of multiple databases. Critical Care Time devoted to patient care services described in this note independent of APP/resident time (if applicable)  is 31 minutes.   Erskine Emery MD [facility] 06/13/2019 7:58 AM Personal pager: [phone] If unanswered, please page CCM On-call: [phone]

## 2019-06-13 NOTE — Progress Notes (Signed)
[facility] Progress Note   [facility], new low EF and NSTEMI txd to [facility] 3/29: 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 Admission Scr was 0.99.  Assessment/ Plan:   1. Acute hypoxic respiratory failure on 05/23/19- on vent via trach. S/p right chest tube. Oliguric/anuricAKI- CRRT started on 4/10 and stopped 4/29 w/ attempted transition to IHD on 05/17/19 but failed and required resumption of CRRT 1. Remains anuric. 2. Resumed CRRT on 05/23/19 following acute worsening of his oxygenation and hypotension.  3. Paused CRRT 5/23 with plans for trial of iHD to assess if he will tolerate.  Trial 5/25 with measures to help tolerate (low temp, albumin, UF profiling) was not successful 4. Resumed CRRT 5/26 with UF supported by vasopressor titration per CHF team  3. Vascular access-LIJ tunneledcatheter placed by IR on 05/19/19 4. Anemia of CKD, multifactorial-transfusions per primary team. Hemoglobin 7.8 today. Darbo 115mcg started 5/11 > now on 159mcg 5. CAD- 3 vessel with EF 25% s/p CABG and MVR 0/0/93 complicated by cardiogenic shock as below 6. Acute systolic CHF and cardiogenic shock-  Dobutamine, NE, florinef, midodrine.  7. Electrolytes- K 5.2 this AM and managed by RRT. No concerns or changes 8. MR s/p MVR 9. Severe debility  Plan is to continue current LOC until grandson graduation HS early 06/2019 then re-evaluate  Subjective:   Patient resting comfortably when I came by. He did not voice any concerns or complaints.   Objective:   BP (!) 121/93   Pulse 84   Temp (!) 97.5 F (36.4 C) (Oral)   Resp (!) 21   Ht 5\' 11"  (1.803 m) Comment: measured x 3  Wt 66.2 kg   SpO2 99%   BMI 20.36 kg/m   Intake/Output Summary (Last 24 hours) at 06/13/2019 1103 Last data filed at 06/13/2019 0900 Gross per 24 hour  Intake 1929.56 ml  Output  4008 ml  Net -2078.44 ml   Weight change: -3.9 kg  Physical Exam: Gen: frail, thin, cachectic, eyes closed but opens eyes to questioning Neck: lef IJTunneled cath(placed 5/3) CVS: normal rate Resp:Coarse bilateral breath sounds, ventilated/trached, rt sided CT Abd: +BS, soft, NT/ND GHW:EXHBZ  1+ lower extremity edema-atrophied legs  Imaging: DG Chest Port 1 View  Result Date: 06/12/2019 CLINICAL DATA:  Status post CABG EXAM: PORTABLE CHEST 1 VIEW COMPARISON:  06/11/2019 FINDINGS: Right chest tube in place without pneumothorax. Tracheostomy tube tip is above the carina. Large bore central venous catheter is identified with tip at the cavoatrial junction. There is a right arm PICC line with tip also at the cavoatrial junction. Feeding tube is in place. Stable cardiomediastinal silhouette. Diffuse bilateral airspace densities appear unchanged from the previous exam. IMPRESSION: 1. Stable support apparatus.  Right chest tube without pneumothorax. 2. No change in aeration to the lungs compared with previous exam. Electronically Signed   By: Kerby Moors M.D.   On: 06/12/2019 08:41    Labs: BMET Recent Labs  Lab 06/10/19 0330 06/10/19 0330 06/10/19 1730 06/11/19 0330 06/11/19 0921 06/11/19 1839 06/12/19 0324 06/12/19 1459 06/13/19 0341  NA 129*   < > 127* 126* 128* 128* 131* 132* 131*  K 5.7*   < > 5.4* 5.8* 6.0* 5.6* 5.8* 5.8* 5.2*  CL 94*  --  93* 93*  --  96* 94* 98 99  CO2 25  --  26 25  --  26 28 28 29   GLUCOSE 236*  --  283* 267*  --  198* 119* 140* 168*  BUN 35*  --  37* 42*  --  36* 35* 34* 34*  CREATININE 2.04*  --  2.22* 2.50*  --  1.96* 1.62* 1.42* 1.33*  CALCIUM 8.5*  --  8.5* 8.5*  --  8.1* 8.4* 8.3* 8.4*  PHOS 3.0  --  3.3 3.6  --  3.3 2.8 2.5 2.3*   < > = values in this interval not displayed.   CBC Recent Labs  Lab 06/10/19 0901 06/10/19 0901 06/11/19 0330 06/11/19 0921 06/12/19 0324 06/13/19 0341  WBC 5.8  --  4.8  --  3.6* 5.4  HGB 8.2*   < >  7.8* 8.5* 7.8* 7.8*  HCT 27.0*   < > 25.3* 25.0* 25.4* 25.6*  MCV 96.4  --  95.8  --  95.5 97.7  PLT 112*  --  114*  --  111* 129*   < > = values in this interval not displayed.    Medications:    . aspirin EC  325 mg Oral Daily   Or  . aspirin  324 mg Per Tube Daily  . B-complex with vitamin C  1 tablet Per Tube Daily  . citalopram  10 mg Per Tube Daily  . collagenase   Topical Daily  . [START ON 06/17/2019] darbepoetin (ARANESP) injection - NON-DIALYSIS  150 mcg Subcutaneous Q Tue-1800  . feeding supplement (PRO-STAT SUGAR FREE 64)  30 mL Per Tube BID  . feeding supplement (VITAL 1.5 CAL)  1,000 mL Per Tube Q24H  . fentaNYL  1 patch Transdermal Q72H  . fludrocortisone  0.2 mg Oral Daily  . gabapentin  100 mg Oral QHS  . insulin aspart  0-6 Units Subcutaneous Q4H  . mouth rinse  15 mL Mouth Rinse Q2H  . melatonin  3 mg Per Tube QHS  . metoCLOPramide (REGLAN) injection  10 mg Intravenous Q6H  . midodrine  20 mg Per Tube Q8H  . nutrition supplement (JUVEN)  1 packet Per Tube BID  . pantoprazole sodium  40 mg Per Tube BID  . QUEtiapine  25 mg Oral QHS  . rOPINIRole  0.5 mg Oral QHS  . sodium chloride flush  10-40 mL Intracatheter Q12H

## 2019-06-13 NOTE — Progress Notes (Addendum)
TCTS DAILY ICU PROGRESS NOTE                   [address]            [name] [address]          [phone]   42 Days Post-Op Procedure(s) (LRB): VIDEO BRONCHOSCOPY USING DISPOSABLE ANESTHESIA SCOPE (N/A) TRACHEOSTOMY (N/A)  Total Length of Stay:  LOS: 60 days   Subjective: Patient on CRRT , has been more lethargic  Objective: Vital signs in last 24 hours: Temp:  [94.5 F (34.7 C)-97.9 F (36.6 C)] 97.5 F (36.4 C) (05/28 0736) Pulse Rate:  [73-87] 84 (05/28 0753) Cardiac Rhythm: Normal sinus rhythm (05/28 0400) Resp:  [13-43] 21 (05/28 0753) BP: (72-193)/(37-179) 121/93 (05/28 0700) SpO2:  [93 %-100 %] 99 % (05/28 0753) FiO2 (%):  [40 %] 40 % (05/28 0753) Weight:  [66.2 kg] 66.2 kg (05/28 0400)  Filed Weights   06/11/19 0445 06/12/19 0500 06/13/19 0400  Weight: 69.3 kg 70.1 kg 66.2 kg    Weight change: -3.9 kg   CVP:  [6 mmHg-17 mmHg] 9 mmHg  Intake/Output from previous day: 05/27 0701 - 05/28 0700 In: 2690 [I.V.:300; NG/GT:2390] Out: 2725 [Chest Tube:10]  Intake/Output this shift: No intake/output data recorded.  Current Meds: Scheduled Meds: . aspirin EC  325 mg Oral Daily   Or  . aspirin  324 mg Per Tube Daily  . B-complex with vitamin C  1 tablet Per Tube Daily  . chlorhexidine gluconate (MEDLINE KIT)  15 mL Mouth Rinse BID  . citalopram  10 mg Per Tube Daily  . collagenase   Topical Daily  . [START ON 06/17/2019] darbepoetin (ARANESP) injection - NON-DIALYSIS  150 mcg Subcutaneous Q Tue-1800  . feeding supplement (PRO-STAT SUGAR FREE 64)  30 mL Per Tube BID  . feeding supplement (VITAL 1.5 CAL)  1,000 mL Per Tube Q24H  . fentaNYL  1 patch Transdermal Q72H  . fludrocortisone  0.2 mg Oral Daily  . gabapentin  100 mg Oral QHS  . insulin aspart  0-6 Units Subcutaneous Q4H  . mouth rinse  15 mL Mouth Rinse 10 times per day  . melatonin  3 mg Per Tube QHS  . metoCLOPramide (REGLAN) injection  10 mg Intravenous Q6H  . midodrine  20  mg Per Tube Q8H  . nutrition supplement (JUVEN)  1 packet Per Tube BID  . pantoprazole sodium  40 mg Per Tube BID  . QUEtiapine  25 mg Oral QHS  . rOPINIRole  0.5 mg Oral QHS  . sodium chloride flush  10-40 mL Intracatheter Q12H   Continuous Infusions: .  prismasol BGK 4/2.5 500 mL/hr at 06/13/19 0120  . sodium chloride 250 mL (06/10/19 1053)  . sodium chloride Stopped (05/31/19 0958)  . dextrose 5 % and 0.9% NaCl Stopped (06/06/19 0513)  . DOBUTamine 5 mcg/kg/min (06/13/19 0700)  . heparin 999 mL/hr at 06/11/19 0836  . norepinephrine (LEVOPHED) Adult infusion 8 mcg/min (06/13/19 0700)  . prismasol BGK 0/2.5 300 mL/hr at 06/12/19 1743  . prismasol BGK 4/2.5 1,000 mL/hr at 06/13/19 0827  . sodium phosphate  Dextrose 5% IVPB 10 mmol (06/13/19 0849)   PRN Meds:.sodium chloride, alum & mag hydroxide-simeth, dextrose, diphenhydrAMINE, fentaNYL (SUBLIMAZE) injection, Gerhardt's butt cream, heparin, heparin, heparin, levalbuterol, ondansetron (ZOFRAN) IV, pneumococcal 23 valent vaccine, polyethylene glycol, sennosides, sodium chloride flush, sorbitol, traMADol  Heart: RRR Lungs: Coarse breath sounds bilaterally Abdomen: Soft, bowel sounds present Extremities: Boots  in place Wound: Clean and dry.  Right chest tube: Little to no drainage of late  Lab Results: CBC: Recent Labs    06/12/19 0324 06/13/19 0341  WBC 3.6* 5.4  HGB 7.8* 7.8*  HCT 25.4* 25.6*  PLT 111* 129*   BMET:  Recent Labs    06/12/19 1459 06/13/19 0341  NA 132* 131*  K 5.8* 5.2*  CL 98 99  CO2 28 29  GLUCOSE 140* 168*  BUN 34* 34*  CREATININE 1.42* 1.33*  CALCIUM 8.3* 8.4*    CMET: Lab Results  Component Value Date   WBC 5.4 06/13/2019   HGB 7.8 (L) 06/13/2019   HCT 25.6 (L) 06/13/2019   PLT 129 (L) 06/13/2019   GLUCOSE 168 (H) 06/13/2019   CHOL 174 08/21/2017   TRIG 143 08/21/2017   HDL 57 08/21/2017   LDLCALC 88 08/21/2017   ALT 14 06/06/2019   AST 24 06/06/2019   NA 131 (L) 06/13/2019   K  5.2 (H) 06/13/2019   CL 99 06/13/2019   CREATININE 1.33 (H) 06/13/2019   BUN 34 (H) 06/13/2019   CO2 29 06/13/2019   TSH 0.925 03/21/2019   INR 1.6 (H) 05/01/2019   HGBA1C 9.0 (H) 04/01/2019   MICROALBUR 30 08/21/2017    PT/INR:  No results for input(s): LABPROT, INR in the last 72 hours. Radiology: No results found.  Assessment/Plan: S/P Procedure(s) (LRB): VIDEO BRONCHOSCOPY USING DISPOSABLE ANESTHESIA SCOPE (N/A) TRACHEOSTOMY (N/A)  1. CV-S/p removal of Impella on 04/09.  S/p V tach arrest 04/15. Previous a fib. SR with HR in the 80's this am.  On  Midodrine 20 mg tid and Nor epinephrine 6 mcg/min, and Dobutamine drip 5 mcg/kg/min. Co ox this am decreased to 53..8  2. Pulmonary-S/p trach 04/16.  Right chest tube with no recorded ouptut last 24 hours. CXR this am appears to show severe, diffuse bilateral airspace disease. Per CCM, PS today and if does well, consider TC 3. Expected post op blood loss anemia-H and H this am stable at  7.8 and 25.6  Has had transfusions previously. Continue Aranesp 4. DM-CBGs 156/113/111 . He was on Metformin 1000 mg bid prior to surgery, but will continue on Insulin for now as NPO, TFs. Pre op HGA1C 9.  5. Oliguric/anuric AKI-Creatinine decreased to 1.33 this am. He had CRRT yesterday. Nephrology following and arranging for CRRT accordingly.  6. GI-severe malnutrition of chronic illness.  Cortrak, TFs at 60 ml/hr. Speech pathology placed post pyloric Cortrak recently as existing Cortak stopped functioning 7. Acute systolic heart failure-CVVHD helping with volume status 8. Extremely deconditioned-will need PT as able. Per family wishes, continue full support 9. Hyponatremia-sodium remains 131 10. Mild thrombocytopenia-platelets this am increased to 129,000  [name] PA-C 06/13/2019 8:57 AM    Mental status decreased today Check blood cultures and ABG Hb steady at 7.8  patient examined and medical record reviewed,agree with above  note. [name] 06/13/2019

## 2019-06-13 NOTE — Progress Notes (Signed)
  Speech Language Pathology Treatment: Nada Boozer Speaking valve  Patient Details Name: Brendan [name] MRN: [id number] DOB: 1955-05-11 Today's Date: 06/13/2019 Time: 3244-0102 SLP Time Calculation (min) (ACUTE ONLY): 14 min  Assessment / Plan / Recommendation Clinical Impression  Pt seen with for inline PMV with RT and pt's wife and daughter present. He is lethargic today but wanted to use valve to speak with family. RT switched settings to [facility] mode and significant mucous via trach and oral cavity when RT deflated cuff. He was able to coordinate speech with respiration with minimal direction needed. Vital signs stable. Spoke with family becoming fatigued after approximately 12 minutes. Given pt's overall status/situation and therapist allowing pt to wear inline to speak with family for short periods if RT/RN are knowledgeable with inline valves.    HPI HPI: 65 yoM originally presented with SOB and fatigue at [facility] found to have new HFrEF, NSTEMI, and bilateral pleural effusions transferred to [facility] on 3/29 for further cardiac evaluation. 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 Started on lasix and milrinone gtts however developed NSVT. Was taken 4/1 for placement of IABP and swan for optimization prior to CABG. Was on precedex for agitation/ confusion, some concern for DTs. On the evening of 4/1, patient developed worsening respiratory distress and hypoxia, PCCM consulted for intubation and vent management. He was successfully extubated on 4/4 and PCCM signed off. Impella was removed 4/11 and patient noted to have increased WOB throughout the day he was placed on Bipap, but mental status continued to worsen requiring reintubation. Found pulseless 4/14 VT requiring 1 minute of CPR and epinephrine to achieve ROSC.  Eventual trach placement 4/16.       SLP Plan  Continue with current plan of care       Recommendations         Patient may use Passy-Muir Speech Valve: with SLP only PMSV  Supervision: Full         Oral Care Recommendations: Oral care QID Follow up Recommendations: LTACH SLP Visit Diagnosis: Aphonia (R49.1) Plan: Continue with current plan of care             Houston Siren 06/13/2019, 5:01 PM Orbie Pyo Colvin Caroli.Ed Risk analyst [phone] Office [phone]

## 2019-06-13 NOTE — Progress Notes (Signed)
Patient ID: BRANNAN CASSEDY, male   DOB: 1955/01/20, 64 y.o.   MRN: [id number]     Advanced Heart Failure Rounding Note  PCP-Cardiologist: No primary care provider on file.   Subjective:    Remains on CRRT. NE at 6. SBP 80-90s Co-ox 59%  More delirious today. Pulling at lines and his rubes tonight. Not following commands routinely.   BCx drawn earlier today.   Objective:   Weight Range: 66.2 kg Body mass index is 20.36 kg/m.   Vital Signs:   Temp:  [96.4 F (35.8 C)-97.9 F (36.6 C)] 96.4 F (35.8 C) (05/28 1951) Pulse Rate:  [77-87] 78 (05/28 2000) Resp:  [13-43] 17 (05/28 2000) BP: (79-121)/(42-93) 87/62 (05/28 2000) SpO2:  [93 %-100 %] 100 % (05/28 2000) FiO2 (%):  [40 %] 40 % (05/28 1951) Weight:  [66.2 kg] 66.2 kg (05/28 0400) Last BM Date: 06/13/19  Weight change: Filed Weights   06/11/19 0445 06/12/19 0500 06/13/19 0400  Weight: 69.3 kg 70.1 kg 66.2 kg    Intake/Output:   Intake/Output Summary (Last 24 hours) at 06/13/2019 2053 Last data filed at 06/13/2019 2000 Gross per 24 hour  Intake 2198.21 ml  Output 4422 ml  Net -2223.79 ml      Physical Exam   General:  Cachetic. Chronically ill on vent Delirious. Pulling at lines and tubes HEENT: normal + cor trak Neck: supple. JVP to jaw Cor: PMI nondisplaced. Regular rate & rhythm. No rubs, gallops or murmurs. Lungs: coarse Abdomen: soft, nontender, +distended. No hepatosplenomegaly. No bruits or masses. Good bowel sounds. Extremities: no cyanosis, clubbing, rash, 1-2+ edema Neuro: awake delirious   Telemetry   SR 70-80s Personally reviewed    Labs    CBC Recent Labs    06/12/19 0324 06/12/19 0324 06/13/19 0341 06/13/19 1134  WBC 3.6*  --  5.4  --   HGB 7.8*   < > 7.8* 8.2*  HCT 25.4*   < > 25.6* 24.0*  MCV 95.5  --  97.7  --   PLT 111*  --  129*  --    < > = values in this interval not displayed.   Basic Metabolic Panel Recent Labs    06/12/19 0324 06/12/19 1459 06/13/19 0341  06/13/19 0341 06/13/19 1134 06/13/19 1649  NA 131*   < > 131*   < > 134* 131*  K 5.8*   < > 5.2*   < > 5.1 5.2*  CL 94*   < > 99  --   --  96*  CO2 28   < > 29  --   --  27  GLUCOSE 119*   < > 168*  --   --  184*  BUN 35*   < > 34*  --   --  36*  CREATININE 1.62*   < > 1.33*  --   --  1.24  CALCIUM 8.4*   < > 8.4*  --   --  8.0*  MG 2.6*  --  2.5*  --   --   --   PHOS 2.8   < > 2.3*  --   --  3.3   < > = values in this interval not displayed.   Liver Function Tests Recent Labs    06/13/19 0341 06/13/19 1649  ALBUMIN 2.2* 2.1*   No results for input(s): LIPASE, AMYLASE in the last 72 hours. Cardiac Enzymes No results for input(s): CKTOTAL, CKMB, CKMBINDEX, TROPONINI in the last 72 hours.  BNP:  BNP (last 3 results) Recent Labs    03/20/2019 0113  BNP 655.7*    ProBNP (last 3 results) No results for input(s): PROBNP in the last 8760 hours.   D-Dimer No results for input(s): DDIMER in the last 72 hours. Hemoglobin A1C No results for input(s): HGBA1C in the last 72 hours. Fasting Lipid Panel No results for input(s): CHOL, HDL, LDLCALC, TRIG, CHOLHDL, LDLDIRECT in the last 72 hours. Thyroid Function Tests No results for input(s): TSH, T4TOTAL, T3FREE, THYROIDAB in the last 72 hours.  Invalid input(s): FREET3  Other results:   Imaging    No results found.   Medications:     Scheduled Medications: . aspirin EC  325 mg Oral Daily   Or  . aspirin  324 mg Per Tube Daily  . B-complex with vitamin C  1 tablet Per Tube Daily  . citalopram  10 mg Per Tube Daily  . collagenase   Topical Daily  . [START ON 06/17/2019] darbepoetin (ARANESP) injection - NON-DIALYSIS  150 mcg Subcutaneous Q Tue-1800  . feeding supplement (PRO-STAT SUGAR FREE 64)  30 mL Per Tube BID  . feeding supplement (VITAL 1.5 CAL)  1,000 mL Per Tube Q24H  . fentaNYL  1 patch Transdermal Q72H  . fludrocortisone  0.2 mg Oral Daily  . gabapentin  100 mg Oral QHS  . insulin aspart  0-6 Units  Subcutaneous Q4H  . mouth rinse  15 mL Mouth Rinse Q2H  . melatonin  3 mg Per Tube QHS  . metoCLOPramide (REGLAN) injection  10 mg Intravenous Q6H  . midodrine  20 mg Per Tube Q8H  . nutrition supplement (JUVEN)  1 packet Per Tube BID  . pantoprazole sodium  40 mg Per Tube BID  . QUEtiapine  25 mg Oral QHS  . rOPINIRole  0.5 mg Oral QHS  . sodium chloride flush  10-40 mL Intracatheter Q12H    Infusions: .  prismasol BGK 4/2.5 500 mL/hr at 06/13/19 1550  . sodium chloride 250 mL (06/10/19 1053)  . sodium chloride Stopped (05/31/19 0958)  . dextrose 5 % and 0.9% NaCl Stopped (06/06/19 0513)  . DOBUTamine 5 mcg/kg/min (06/13/19 2000)  . heparin 999 mL/hr at 06/11/19 0836  . norepinephrine (LEVOPHED) Adult infusion 6 mcg/min (06/13/19 2000)  . prismasol BGK 0/2.5 300 mL/hr at 06/13/19 1133  . prismasol BGK 4/2.5 1,000 mL/hr at 06/13/19 1811    PRN Medications: sodium chloride, alum & mag hydroxide-simeth, dextrose, diphenhydrAMINE, fentaNYL (SUBLIMAZE) injection, Gerhardt's butt cream, heparin, heparin, heparin, levalbuterol, ondansetron (ZOFRAN) IV, pneumococcal 23 valent vaccine, polyethylene glycol, sennosides, sodium chloride flush, sorbitol, traMADol     Assessment/Plan   1. Acute systolic HF ->  Cardiogenic Shock  - Due to iCM. EF 20-25% - Impella 5.5 placed on 4/2.   - s/p CABG/MVRepair on 4/6  - Impella out 4/9.  - Echo 4/10 with EF 25-30%, normal RV, no MR.  - Echo 4/22 EF 30-35% mildly reduced RV MVR ok   - Dobutamine added 5/14 for low co-ox 39% and inability to wean NE.  - CO-OX 59% on NE 6 and DBA 5 - Remains volume overload. Pulling -50-100 on CRRRT.Remains on midodrine at 20 tid and florinef to support BP.  - Repeat echo 05/29/19. EF 35-40% RV ok.   2. 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 Echo stable - No s/s ischemia - On ASA/Crestor  3. Acute hypoxic respiratory failure - Due to  pulmonary edema and PNA - Extubated 4/9  re-intubated on 4/12. Failed re-extubation on 4/14. Now s/p tracheostomy - S/P Bronch 4/27 - 60K yeast. -> ? Colonization.  - CT scan 5/7 with diffuse bilateral infiltrates c/w ARDS and large bilateral effusions R>L.  - s/p R chest tube. No output.  - Remains on vent FiO2 40% - Unable to wean. CCM following  4. Mitral Regurgitation - Mod-severe on ECHO - s/p MV repair, TEE 4/9 with minimal MR s/p repair.   - Echo 4/10 with no significant MR.  - echo 4/22 MVR stable - Repeat echo 05/29/19. EF 35-40% RV ok.  MVR ok  5. Uncontrolled DM - Hgb A1C 9.  - On insulin and sliding scale.  - No change  6. AKI - due to shock - Remains anuric post-op - Back on CRRT due to inability to pull with iHD due to hypotension.  - This remains the main issue currently. Unable to wean to iHD due to pressor dependence. It has now been > 5 weeks. We have tried many options to get him of pressors including blood transfusions, albumin, letting fluid re-accumulate, midodrine at 20 tid but remain unsuccessful. -Intolerant HD 5/25 due to hypotension.  - Significantly volume overloaded continue NE support to get fluid off   7. Acute blood loss Anemia  - Transfused 1UPRBCs 5/9/21and 5/14, 5/17 and 5/19  - hgb 7.8  - transfuse hgb <= 8.2   8. Polymorphic VT - Recurrent VT during respiratory distress 4/14, - Off amio  - No further VT - Keep K> 4.0 Mg > 2.0   9. Severe Malnutrition - Prealbumin improved, up from 8.9>>18.7 - Nutrition on board - Continue w/ TFs  10. Atrial fibrillation. paroxysmal - Remains in NSR.  - Off amiodarone.    11. Debility, severe - Continue PT/OT  12. Chronic pain issues - Appears comfortable.  - having severe constipation. CCM managing   13. Hyponatremia  Sodium up to 131  14. Delirium, acute - cultures drawn  - no focal source. BUN ok   15. Full code  He continues to struggle with low output HF, renal failure, vent dependdence and hypotension. Unfortunately  hass continued to deteriorate over last few days. Family has been at his side and aware. We continue to push hard to have him be able to watch his grandson's HS graduation on 6/2.  CRITICAL CARE Performed by: Glori Bickers  Total critical care time: 35 minutes  Critical care time was exclusive of separately billable procedures and treating other patients.  Critical care was necessary to treat or prevent imminent or life-threatening deterioration.  Critical care was time spent personally by me (independent of midlevel providers or residents) on the following activities: development of treatment plan with patient and/or surrogate as well as nursing, discussions with consultants, evaluation of patient's response to treatment, examination of patient, obtaining history from patient or surrogate, ordering and performing treatments and interventions, ordering and review of laboratory studies, ordering and review of radiographic studies, pulse oximetry and re-evaluation of patient's condition.   Glori Bickers  MD  8:53 PM

## 2019-06-13 NOTE — Progress Notes (Signed)
RT obtained ABG per Dr.Van Trigt order. ABG obtained on CPAP/PSV 15/5. Results as follows: pH 7.40, CO2 48.4, PO2 96 and HCO3 30.7. RT will continue to monitor as needed.

## 2019-06-14 ENCOUNTER — Inpatient Hospital Stay (HOSPITAL_COMMUNITY): Payer: Medicaid Other

## 2019-06-14 LAB — RENAL FUNCTION PANEL
Albumin: 2.1 g/dL — ABNORMAL LOW (ref 3.5–5.0)
Albumin: 2.2 g/dL — ABNORMAL LOW (ref 3.5–5.0)
Anion gap: 5 (ref 5–15)
Anion gap: 7 (ref 5–15)
BUN: 34 mg/dL — ABNORMAL HIGH (ref 8–23)
BUN: 33 mg/dL — ABNORMAL HIGH (ref 8–23)
CO2: 28 mmol/L (ref 22–32)
CO2: 27 mmol/L (ref 22–32)
Calcium: 8.3 mg/dL — ABNORMAL LOW (ref 8.9–10.3)
Calcium: 8.5 mg/dL — ABNORMAL LOW (ref 8.9–10.3)
Chloride: 99 mmol/L (ref 98–111)
Chloride: 99 mmol/L (ref 98–111)
Creatinine, Ser: 1.1 mg/dL (ref 0.61–1.24)
Creatinine, Ser: 1.08 mg/dL (ref 0.61–1.24)
GFR calc Af Amer: >60 mL/min (ref >60)
GFR calc Af Amer: >60 mL/min (ref >60)
GFR calc non Af Amer: >60 mL/min (ref >60)
GFR calc non Af Amer: >60 mL/min (ref >60)
Glucose, Bld: 174 mg/dL — ABNORMAL HIGH (ref 70–99)
Glucose, Bld: 180 mg/dL — ABNORMAL HIGH (ref 70–99)
Phosphorus: 2.5 mg/dL (ref 2.5–4.6)
Phosphorus: 2.8 mg/dL (ref 2.5–4.6)
Potassium: 5 mmol/L (ref 3.5–5.1)
Potassium: 5.1 mmol/L (ref 3.5–5.1)
Sodium: 132 mmol/L — ABNORMAL LOW (ref 135–145)
Sodium: 133 mmol/L — ABNORMAL LOW (ref 135–145)

## 2019-06-14 LAB — GLUCOSE, CAPILLARY
Glucose-Capillary: 112 mg/dL — ABNORMAL HIGH (ref 70–99)
Glucose-Capillary: 121 mg/dL — ABNORMAL HIGH (ref 70–99)
Glucose-Capillary: 130 mg/dL — ABNORMAL HIGH (ref 70–99)
Glucose-Capillary: 142 mg/dL — ABNORMAL HIGH (ref 70–99)
Glucose-Capillary: 177 mg/dL — ABNORMAL HIGH (ref 70–99)
Glucose-Capillary: 178 mg/dL — ABNORMAL HIGH (ref 70–99)
Glucose-Capillary: 91 mg/dL (ref 70–99)

## 2019-06-14 LAB — ACID FAST CULTURE WITH REFLEXED SENSITIVITIES (MYCOBACTERIA): Acid Fast Culture: NEGATIVE

## 2019-06-14 LAB — CBC
HCT: 23.8 % — ABNORMAL LOW (ref 39.0–52.0)
Hemoglobin: 7.1 g/dL — ABNORMAL LOW (ref 13.0–17.0)
MCH: 29.1 pg (ref 26.0–34.0)
MCHC: 29.8 g/dL — ABNORMAL LOW (ref 30.0–36.0)
MCV: 97.5 fL (ref 80.0–100.0)
Platelets: 128 10*3/uL — ABNORMAL LOW (ref 150–400)
RBC: 2.44 MIL/uL — ABNORMAL LOW (ref 4.22–5.81)
RDW: 15.4 % (ref 11.5–15.5)
WBC: 5.5 10*3/uL (ref 4.0–10.5)
nRBC: 0 % (ref 0.0–0.2)

## 2019-06-14 LAB — COOXEMETRY PANEL
Carboxyhemoglobin: 1.3 % (ref 0.5–1.5)
Methemoglobin: 0.9 % (ref 0.0–1.5)
O2 Saturation: 48.2 %
Total hemoglobin: 7.3 g/dL — ABNORMAL LOW (ref 12.0–16.0)

## 2019-06-14 LAB — MAGNESIUM: Magnesium: 2.4 mg/dL (ref 1.7–2.4)

## 2019-06-14 NOTE — Progress Notes (Signed)
[facility] DAILY ICU PROGRESS NOTE                   [address]            [place] [address]          [phone]   43 Days Post-Op Procedure(s) (LRB): VIDEO BRONCHOSCOPY USING DISPOSABLE ANESTHESIA SCOPE (N/A) TRACHEOSTOMY (N/A)  Total Length of Stay:  LOS: 61 days   Subjective: No events  Objective: Vital signs in last 24 hours: Temp:  [96.4 F (35.8 C)-97.7 F (36.5 C)] 97.5 F (36.4 C) (05/29 0805) Pulse Rate:  [77-83] 79 (05/29 0808) Cardiac Rhythm: Normal sinus rhythm (05/29 0800) Resp:  [15-38] 17 (05/29 0808) BP: (83-107)/(45-83) 93/83 (05/29 0808) SpO2:  [97 %-100 %] 100 % (05/29 0808) FiO2 (%):  [40 %] 40 % (05/29 0808) Weight:  [65.6 kg] 65.6 kg (05/29 0356)  Filed Weights   06/12/19 0500 06/13/19 0400 06/14/19 0356  Weight: 70.1 kg 66.2 kg 65.6 kg    Weight change: -0.6 kg   CVP:  [8 mmHg-9 mmHg] 9 mmHg  Intake/Output from previous day: 05/28 0701 - 05/29 0700 In: 2327.6 [I.V.:257.1; NG/GT:1800; IV Piggyback:270.5] Out: 6160 [Stool:200]  Intake/Output this shift: Total I/O In: 70 [I.V.:10; NG/GT:60] Out: 120 [Other:120]  Current Meds: Scheduled Meds: . aspirin EC  325 mg Oral Daily   Or  . aspirin  324 mg Per Tube Daily  . B-complex with vitamin C  1 tablet Per Tube Daily  . citalopram  10 mg Per Tube Daily  . collagenase   Topical Daily  . [START ON 06/17/2019] darbepoetin (ARANESP) injection - NON-DIALYSIS  150 mcg Subcutaneous Q Tue-1800  . feeding supplement (PRO-STAT SUGAR FREE 64)  30 mL Per Tube BID  . feeding supplement (VITAL 1.5 CAL)  1,000 mL Per Tube Q24H  . fentaNYL  1 patch Transdermal Q72H  . fludrocortisone  0.2 mg Oral Daily  . gabapentin  100 mg Oral QHS  . insulin aspart  0-6 Units Subcutaneous Q4H  . mouth rinse  15 mL Mouth Rinse Q2H  . melatonin  3 mg Per Tube QHS  . metoCLOPramide (REGLAN) injection  10 mg Intravenous Q6H  . midodrine  20 mg Per Tube Q8H  . nutrition supplement (JUVEN)  1 packet Per  Tube BID  . pantoprazole sodium  40 mg Per Tube BID  . QUEtiapine  25 mg Oral QHS  . rOPINIRole  0.5 mg Oral QHS  . sodium chloride flush  10-40 mL Intracatheter Q12H   Continuous Infusions: .  prismasol BGK 4/2.5 500 mL/hr at 06/13/19 2325  . sodium chloride 250 mL (06/10/19 1053)  . dextrose 5 % and 0.9% NaCl Stopped (06/06/19 0513)  . DOBUTamine 5 mcg/kg/min (06/14/19 0800)  . norepinephrine (LEVOPHED) Adult infusion 6 mcg/min (06/14/19 0800)  . prismasol BGK 0/2.5 300 mL/hr at 06/14/19 0456  . prismasol BGK 4/2.5 1,000 mL/hr at 06/14/19 0430   PRN Meds:.sodium chloride, alum & mag hydroxide-simeth, dextrose, diphenhydrAMINE, fentaNYL (SUBLIMAZE) injection, Gerhardt's butt cream, heparin, heparin, levalbuterol, ondansetron (ZOFRAN) IV, pneumococcal 23 valent vaccine, polyethylene glycol, sennosides, sodium chloride flush, sorbitol, traMADol  Heart: RRR Lungs: Coarse breath sounds bilaterally Abdomen: Soft, bowel sounds present Extremities: Boots in place Wound: Clean and dry.  Right chest tube: Little to no drainage of late  Lab Results: CBC: Recent Labs    06/13/19 0341 06/13/19 0341 06/13/19 1134 06/14/19 0407  WBC 5.4  --   --  5.5  HGB 7.8*   < > 8.2* 7.1*  HCT 25.6*   < > 24.0* 23.8*  PLT 129*  --   --  128*   < > = values in this interval not displayed.   BMET:  Recent Labs    06/13/19 1649 06/14/19 0407  NA 131* 132*  K 5.2* 5.0  CL 96* 99  CO2 27 28  GLUCOSE 184* 174*  BUN 36* 34*  CREATININE 1.24 1.10  CALCIUM 8.0* 8.3*    CMET: Lab Results  Component Value Date   WBC 5.5 06/14/2019   HGB 7.1 (L) 06/14/2019   HCT 23.8 (L) 06/14/2019   PLT 128 (L) 06/14/2019   GLUCOSE 174 (H) 06/14/2019   CHOL 174 08/21/2017   TRIG 143 08/21/2017   HDL 57 08/21/2017   LDLCALC 88 08/21/2017   ALT 14 06/06/2019   AST 24 06/06/2019   NA 132 (L) 06/14/2019   K 5.0 06/14/2019   CL 99 06/14/2019   CREATININE 1.10 06/14/2019   BUN 34 (H) 06/14/2019   CO2 28  06/14/2019   TSH 0.925 03/22/2019   INR 1.6 (H) 05/01/2019   HGBA1C 9.0 (H) 04/07/2019   MICROALBUR 30 08/21/2017    PT/INR:  No results for input(s): LABPROT, INR in the last 72 hours. Radiology: DG CHEST PORT 1 VIEW  Result Date: 06/14/2019 CLINICAL DATA:  Chest tube placement EXAM: PORTABLE CHEST 1 VIEW COMPARISON:  06/12/2019 FINDINGS: Tracheostomy tube, central venous line, and feeding tube unchanged. RIGHT chest tube in place. There is bilateral diffuse airspace disease. No pneumothorax appreciated. IMPRESSION: 1. Stable support apparatus. 2. Diffuse bilateral dense airspace disease. 3. RIGHT chest tube in place without appreciable pneumothorax. Electronically Signed   By: Suzy Bouchard M.D.   On: 06/14/2019 09:59    Assessment/Plan: S/P Procedure(s) (LRB): VIDEO BRONCHOSCOPY USING DISPOSABLE ANESTHESIA SCOPE (N/A) TRACHEOSTOMY (N/A)  Neuro: pain controlled.  Follows commands CV: remains on levophed.  On midodrine Pulm: remains on the vent Renal: remains on CRRT GI: tolerating tubefeeds Dispo: continue ICU care  [place]  06/14/2019 10:12 AM

## 2019-06-14 NOTE — Progress Notes (Signed)
Patient ID: Brendan [name], male   DOB: Dec 17, 1955, 64 y.o.   MRN: [id number]     Advanced Heart Failure Rounding Note  PCP-Cardiologist: No primary care provider on file.   Subjective:    Remains on CRRT. NE remains at 6. SBP still 80-90s Co-ox 59%-> 48%  Less delirious today. Able to interact and follow commands. Very weak   Hgb 7.1  BCx NGTD  Objective:   Weight Range: 65.6 kg Body mass index is 20.17 kg/m.   Vital Signs:   Temp:  [96.4 F (35.8 C)-97.7 F (36.5 C)] 97.5 F (36.4 C) (05/29 0805) Pulse Rate:  [72-81] 72 (05/29 1500) Resp:  [15-38] 22 (05/29 1500) BP: (74-107)/(45-83) 93/59 (05/29 1500) SpO2:  [96 %-100 %] 97 % (05/29 1500) FiO2 (%):  [40 %] 40 % (05/29 1151) Weight:  [65.6 kg] 65.6 kg (05/29 0356) Last BM Date: 06/14/19  Weight change: Filed Weights   06/12/19 0500 06/13/19 0400 06/14/19 0356  Weight: 70.1 kg 66.2 kg 65.6 kg    Intake/Output:   Intake/Output Summary (Last 24 hours) at 06/14/2019 1518 Last data filed at 06/14/2019 1500 Gross per 24 hour  Intake 2254.27 ml  Output 3947 ml  Net -1692.73 ml      Physical Exam   General:  Cachetic. Chronically ill on vent  HEENT: normal + Cor-track Neck: supple. +JVD  thryomegaly appreciated. Cor: PMI nondisplaced. Regular rate & rhythm. No rubs, gallops or murmurs. Lungs: coarse R chest tube Abdomen: soft, nontender, nondistended. No hepatosplenomegaly. No bruits or masses. Good bowel sounds. Extremities: no cyanosis, clubbing, rash, cacetic mild pedal edema Neuro: awake on vent   Telemetry   SR 70-80s Personally reviewed    Labs    CBC Recent Labs    06/13/19 0341 06/13/19 0341 06/13/19 1134 06/14/19 0407  WBC 5.4  --   --  5.5  HGB 7.8*   < > 8.2* 7.1*  HCT 25.6*   < > 24.0* 23.8*  MCV 97.7  --   --  97.5  PLT 129*  --   --  128*   < > = values in this interval not displayed.   Basic Metabolic Panel Recent Labs    06/13/19 0341 06/13/19 1134 06/13/19 1649  06/14/19 0407  NA 131*   < > 131* 132*  K 5.2*   < > 5.2* 5.0  CL 99  --  96* 99  CO2 29  --  27 28  GLUCOSE 168*  --  184* 174*  BUN 34*  --  36* 34*  CREATININE 1.33*  --  1.24 1.10  CALCIUM 8.4*  --  8.0* 8.3*  MG 2.5*  --   --  2.4  PHOS 2.3*  --  3.3 2.5   < > = values in this interval not displayed.   Liver Function Tests Recent Labs    06/13/19 1649 06/14/19 0407  ALBUMIN 2.1* 2.1*   No results for input(s): LIPASE, AMYLASE in the last 72 hours. Cardiac Enzymes No results for input(s): CKTOTAL, CKMB, CKMBINDEX, TROPONINI in the last 72 hours.  BNP: BNP (last 3 results) Recent Labs    03/22/2019 0113  BNP 655.7*    ProBNP (last 3 results) No results for input(s): PROBNP in the last 8760 hours.   D-Dimer No results for input(s): DDIMER in the last 72 hours. Hemoglobin A1C No results for input(s): HGBA1C in the last 72 hours. Fasting Lipid Panel No results for input(s): CHOL, HDL, LDLCALC, TRIG, CHOLHDL,  LDLDIRECT in the last 72 hours. Thyroid Function Tests No results for input(s): TSH, T4TOTAL, T3FREE, THYROIDAB in the last 72 hours.  Invalid input(s): FREET3  Other results:   Imaging    DG CHEST PORT 1 VIEW  Result Date: 06/14/2019 CLINICAL DATA:  Chest tube placement EXAM: PORTABLE CHEST 1 VIEW COMPARISON:  06/12/2019 FINDINGS: Tracheostomy tube, central venous line, and feeding tube unchanged. RIGHT chest tube in place. There is bilateral diffuse airspace disease. No pneumothorax appreciated. IMPRESSION: 1. Stable support apparatus. 2. Diffuse bilateral dense airspace disease. 3. RIGHT chest tube in place without appreciable pneumothorax. Electronically Signed   By: Suzy Bouchard M.D.   On: 06/14/2019 09:59     Medications:     Scheduled Medications: . aspirin EC  325 mg Oral Daily   Or  . aspirin  324 mg Per Tube Daily  . B-complex with vitamin C  1 tablet Per Tube Daily  . citalopram  10 mg Per Tube Daily  . collagenase   Topical Daily    . [START ON 06/17/2019] darbepoetin (ARANESP) injection - NON-DIALYSIS  150 mcg Subcutaneous Q Tue-1800  . feeding supplement (PRO-STAT SUGAR FREE 64)  30 mL Per Tube BID  . feeding supplement (VITAL 1.5 CAL)  1,000 mL Per Tube Q24H  . fentaNYL  1 patch Transdermal Q72H  . fludrocortisone  0.2 mg Oral Daily  . gabapentin  100 mg Oral QHS  . insulin aspart  0-6 Units Subcutaneous Q4H  . mouth rinse  15 mL Mouth Rinse Q2H  . melatonin  3 mg Per Tube QHS  . metoCLOPramide (REGLAN) injection  10 mg Intravenous Q6H  . midodrine  20 mg Per Tube Q8H  . nutrition supplement (JUVEN)  1 packet Per Tube BID  . pantoprazole sodium  40 mg Per Tube BID  . QUEtiapine  25 mg Oral QHS  . rOPINIRole  0.5 mg Oral QHS  . sodium chloride flush  10-40 mL Intracatheter Q12H    Infusions: .  prismasol BGK 4/2.5 500 mL/hr at 06/13/19 2325  . sodium chloride 250 mL (06/10/19 1053)  . dextrose 5 % and 0.9% NaCl Stopped (06/06/19 0513)  . DOBUTamine 5 mcg/kg/min (06/14/19 1500)  . norepinephrine (LEVOPHED) Adult infusion 6 mcg/min (06/14/19 1500)  . prismasol BGK 0/2.5 300 mL/hr at 06/14/19 0456  . prismasol BGK 4/2.5 1,000 mL/hr at 06/14/19 1445    PRN Medications: sodium chloride, alum & mag hydroxide-simeth, dextrose, diphenhydrAMINE, fentaNYL (SUBLIMAZE) injection, Gerhardt's butt cream, heparin, heparin, levalbuterol, ondansetron (ZOFRAN) IV, pneumococcal 23 valent vaccine, polyethylene glycol, sennosides, sodium chloride flush, sorbitol, traMADol     Assessment/Plan   1. Acute systolic HF ->  Cardiogenic Shock  - Due to iCM. EF 20-25% - Impella 5.5 placed on 4/2.   - s/p CABG/MVRepair on 4/6  - Impella out 4/9.  - Echo 4/10 with EF 25-30%, normal RV, no MR.  - Echo 4/22 EF 30-35% mildly reduced RV MVR ok   - Dobutamine added 5/14 for low co-ox 39% and inability to wean NE.  - CO-OX 48% on NE 6 and DBA 5 - Remains volume overload. Weight up nearly 20 pounds but fluid removal limited by  hypotension. Pulling -50-100 on CRRRT.Remains on DBA, NE, midodrine at 20 tid and florinef to support BP.  - Repeat echo 05/29/19. EF 35-40% RV ok.   2. 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 Echo stable - No s/s ischemia -  On ASA/Crestor  3. Acute hypoxic respiratory failure - Due to pulmonary edema and PNA - Extubated 4/9 re-intubated on 4/12. Failed re-extubation on 4/14. Now s/p tracheostomy - S/P Bronch 4/27 - 60K yeast. -> ? Colonization.  - CT scan 5/7 with diffuse bilateral infiltrates c/w ARDS and large bilateral effusions R>L.  - s/p R chest tube. No output.  - Remains on vent FiO2 40% - Unable to wean due to weakness and volume overload. CCM following  4. Mitral Regurgitation - Mod-severe on ECHO - s/p MV repair, TEE 4/9 with minimal MR s/p repair.   - Echo 4/10 with no significant MR.  - echo 4/22 MVR stable - Repeat echo 05/29/19. EF 35-40% RV ok.  MVR ok  5. Uncontrolled DM - Hgb A1C 9.  - On insulin and sliding scale.  - No change  6. AKI - due to shock - Remains anuric post-op - Back on CRRT due to inability to pull with iHD due to hypotension. Still unable to pull.  - This remains the main issue currently. Unable to wean to iHD due to pressor dependence. It has now been > 5 weeks. We have tried many options to get him of pressors including blood transfusions, albumin, letting fluid re-accumulate, midodrine at 20 tid but remain unsuccessful. - Significantly volume overloaded continue NE support to get fluid off. Weight up 20 pounds  7. Acute blood loss Anemia  - Transfused 1UPRBCs 5/9/21and 5/14, 5/17 and 5/19  - hgb 7.8  -> 7.1 - transfuse 1u  8. Polymorphic VT - Recurrent VT during respiratory distress 4/14, - Off amio  - No further VT - Keep K> 4.0 Mg > 2.0   9. Severe Malnutrition - Prealbumin improved, up from 8.9>>18.7 - Nutrition on board - Continue w/ TFs  10. Atrial fibrillation. paroxysmal - Remains in NSR.  -  Off amiodarone.    11. Debility, severe - Continue PT/OT  12. Chronic pain issues - Appears comfortable.  - having severe constipation. CCM managing   13. Hyponatremia  Sodium up to 132  14. Delirium, acute - cultures drawn  - no focal source. BUN ok  - improved today  15. Full code  He continues to struggle with low output HF, renal failure, vent dependence and hypotension. Unfortunately he has continued to deteriorate over last few days. Family has been at his side and aware but they remain optimstic that he can recover but I just worry that he continues to lose ground overall. We continue to push hard to have him be able to watch his grandson's HS graduation on 6/2.  CRITICAL CARE Performed by: Glori Bickers  Total critical care time: 35 minutes  Critical care time was exclusive of separately billable procedures and treating other patients.  Critical care was necessary to treat or prevent imminent or life-threatening deterioration.  Critical care was time spent personally by me (independent of midlevel providers or residents) on the following activities: development of treatment plan with patient and/or surrogate as well as nursing, discussions with consultants, evaluation of patient's response to treatment, examination of patient, obtaining history from patient or surrogate, ordering and performing treatments and interventions, ordering and review of laboratory studies, ordering and review of radiographic studies, pulse oximetry and re-evaluation of patient's condition.   Glori Bickers  MD  3:18 PM

## 2019-06-14 NOTE — Progress Notes (Signed)
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 Admission Scr was 0.99.  Assessment/ Plan:   1. Acute hypoxic respiratory failure on 05/23/19- on vent via trach. S/p right chest tube. Oliguric/anuricAKI- CRRT started on 4/10.  1. Remains anuric. 2. Attempted transition to iHD on 5/1 but failed 3. Resumed CRRT on 05/23/19 following acute worsening of his oxygenation and hypotension.  4. Paused CRRT 5/23 with plans for trial of iHD to assess if he will tolerate.  Trial 5/25 with measures to help tolerate (low temp, albumin, UF profiling) was not successful 5. Resumed CRRT 5/26 with UF supported by vasopressor titration per CHF team 2. BUN elevation: elevated to 34 but this is nearly within normal limits and not at a level that would have any neurological or other deleterious effects. Clearance is adequate with CRRT. 3. Vascular access-LIJ tunneledcatheter placed by IR on 05/19/19 4. Anemia of CKD, multifactorial-transfusions per primary team. Hemoglobin 7.8 today. Darbo 121mcg started 5/11 > now on 165mcg 5. CAD- 3 vessel with EF 25% s/p CABG and MVR 04/22/40 complicated by cardiogenic shock as below 6. Acute systolic CHF and cardiogenic shock-  Dobutamine, NE, florinef, midodrine.  7. Electrolytes- K 5 this AM and managed by RRT. No concerns or changes 8. MR s/p MVR 9. Severe debility  Plan is to continue current LOC until grandson graduation HS early 06/2019 then re-evaluate  Subjective:   Patient resting in bed and does not have any complaints   Objective:   BP (!) 83/48   Pulse 74   Temp (!) 97.5 F (36.4 C) (Oral)   Resp 19   Ht 5\' 11"  (1.803 m) Comment: measured x 3  Wt 65.6 kg   SpO2 100%   BMI 20.17 kg/m   Intake/Output Summary (Last 24 hours) at  06/14/2019 1050 Last data filed at 06/14/2019 1000 Gross per 24 hour  Intake 2271.2 ml  Output 3571 ml  Net -1299.8 ml   Weight change: -0.6 kg  Physical Exam: Gen: frail, thin, cachectic, eyes closed but opens during exam to pain Neck: lef IJTunneled cath(placed 5/3) CVS: normal rate Resp:Coarse bilateral breath sounds, ventilated/trached, rt sided CT Abd: +BS, soft, NT/ND VZD:GLOVF  1+ lower extremity edema-atrophied legs  Imaging: DG CHEST PORT 1 VIEW  Result Date: 06/14/2019 CLINICAL DATA:  Chest tube placement EXAM: PORTABLE CHEST 1 VIEW COMPARISON:  06/12/2019 FINDINGS: Tracheostomy tube, central venous line, and feeding tube unchanged. RIGHT chest tube in place. There is bilateral diffuse airspace disease. No pneumothorax appreciated. IMPRESSION: 1. Stable support apparatus. 2. Diffuse bilateral dense airspace disease. 3. RIGHT chest tube in place without appreciable pneumothorax. Electronically Signed   By: Suzy Bouchard M.D.   On: 06/14/2019 09:59    Labs: BMET Recent Labs  Lab 06/11/19 0330 06/11/19 0921 06/11/19 1839 06/12/19 0324 06/12/19 1459 06/13/19 0341 06/13/19 1134 06/13/19 1649 06/14/19 0407  NA 126*   < > 128* 131* 132* 131* 134* 131* 132*  K 5.8*   < > 5.6* 5.8* 5.8* 5.2* 5.1 5.2* 5.0  CL 93*  --  96* 94* 98 99  --  96* 99  CO2 25  --  26 28 28 29   --  27 28  GLUCOSE 267*  --  198* 119* 140* 168*  --  184* 174*  BUN 42*  --  36* 35* 34* 34*  --  36* 34*  CREATININE 2.50*  --  1.96* 1.62* 1.42* 1.33*  --  1.24 1.10  CALCIUM 8.5*  --  8.1* 8.4* 8.3* 8.4*  --  8.0* 8.3*  PHOS 3.6  --  3.3 2.8 2.5 2.3*  --  3.3 2.5   < > = values in this interval not displayed.   CBC Recent Labs  Lab 06/11/19 0330 06/11/19 0921 06/12/19 0324 06/13/19 0341 06/13/19 1134 06/14/19 0407  WBC 4.8  --  3.6* 5.4  --  5.5  HGB 7.8*   < > 7.8* 7.8* 8.2* 7.1*  HCT 25.3*   < > 25.4* 25.6* 24.0* 23.8*  MCV 95.8  --  95.5 97.7  --  97.5  PLT 114*  --  111* 129*   --  128*   < > = values in this interval not displayed.    Medications:    . aspirin EC  325 mg Oral Daily   Or  . aspirin  324 mg Per Tube Daily  . B-complex with vitamin C  1 tablet Per Tube Daily  . citalopram  10 mg Per Tube Daily  . collagenase   Topical Daily  . [START ON 06/17/2019] darbepoetin (ARANESP) injection - NON-DIALYSIS  150 mcg Subcutaneous Q Tue-1800  . feeding supplement (PRO-STAT SUGAR FREE 64)  30 mL Per Tube BID  . feeding supplement (VITAL 1.5 CAL)  1,000 mL Per Tube Q24H  . fentaNYL  1 patch Transdermal Q72H  . fludrocortisone  0.2 mg Oral Daily  . gabapentin  100 mg Oral QHS  . insulin aspart  0-6 Units Subcutaneous Q4H  . mouth rinse  15 mL Mouth Rinse Q2H  . melatonin  3 mg Per Tube QHS  . metoCLOPramide (REGLAN) injection  10 mg Intravenous Q6H  . midodrine  20 mg Per Tube Q8H  . nutrition supplement (JUVEN)  1 packet Per Tube BID  . pantoprazole sodium  40 mg Per Tube BID  . QUEtiapine  25 mg Oral QHS  . rOPINIRole  0.5 mg Oral QHS  . sodium chloride flush  10-40 mL Intracatheter Q12H

## 2019-06-15 LAB — RENAL FUNCTION PANEL
Albumin: 2 g/dL — ABNORMAL LOW (ref 3.5–5.0)
Albumin: 2.1 g/dL — ABNORMAL LOW (ref 3.5–5.0)
Anion gap: 6 (ref 5–15)
Anion gap: 4 — ABNORMAL LOW (ref 5–15)
BUN: 37 mg/dL — ABNORMAL HIGH (ref 8–23)
BUN: 40 mg/dL — ABNORMAL HIGH (ref 8–23)
CO2: 27 mmol/L (ref 22–32)
CO2: 28 mmol/L (ref 22–32)
Calcium: 8.3 mg/dL — ABNORMAL LOW (ref 8.9–10.3)
Calcium: 8.1 mg/dL — ABNORMAL LOW (ref 8.9–10.3)
Chloride: 100 mmol/L (ref 98–111)
Chloride: 101 mmol/L (ref 98–111)
Creatinine, Ser: 1.06 mg/dL (ref 0.61–1.24)
Creatinine, Ser: 1.05 mg/dL (ref 0.61–1.24)
GFR calc Af Amer: >60 mL/min (ref >60)
GFR calc Af Amer: >60 mL/min (ref >60)
GFR calc non Af Amer: >60 mL/min (ref >60)
GFR calc non Af Amer: >60 mL/min (ref >60)
Glucose, Bld: 147 mg/dL — ABNORMAL HIGH (ref 70–99)
Glucose, Bld: 206 mg/dL — ABNORMAL HIGH (ref 70–99)
Phosphorus: 2.3 mg/dL — ABNORMAL LOW (ref 2.5–4.6)
Phosphorus: 2.4 mg/dL — ABNORMAL LOW (ref 2.5–4.6)
Potassium: 5 mmol/L (ref 3.5–5.1)
Potassium: 5 mmol/L (ref 3.5–5.1)
Sodium: 133 mmol/L — ABNORMAL LOW (ref 135–145)
Sodium: 133 mmol/L — ABNORMAL LOW (ref 135–145)

## 2019-06-15 LAB — GLUCOSE, CAPILLARY
Glucose-Capillary: 121 mg/dL — ABNORMAL HIGH (ref 70–99)
Glucose-Capillary: 137 mg/dL — ABNORMAL HIGH (ref 70–99)
Glucose-Capillary: 150 mg/dL — ABNORMAL HIGH (ref 70–99)
Glucose-Capillary: 197 mg/dL — ABNORMAL HIGH (ref 70–99)
Glucose-Capillary: 176 mg/dL — ABNORMAL HIGH (ref 70–99)
Glucose-Capillary: 166 mg/dL — ABNORMAL HIGH (ref 70–99)
Glucose-Capillary: 88 mg/dL (ref 70–99)

## 2019-06-15 LAB — CBC
HCT: 23.5 % — ABNORMAL LOW (ref 39.0–52.0)
Hemoglobin: 7 g/dL — ABNORMAL LOW (ref 13.0–17.0)
MCH: 28.8 pg (ref 26.0–34.0)
MCHC: 29.8 g/dL — ABNORMAL LOW (ref 30.0–36.0)
MCV: 96.7 fL (ref 80.0–100.0)
Platelets: 131 10*3/uL — ABNORMAL LOW (ref 150–400)
RBC: 2.43 MIL/uL — ABNORMAL LOW (ref 4.22–5.81)
RDW: 15.1 % (ref 11.5–15.5)
WBC: 5.5 10*3/uL (ref 4.0–10.5)
nRBC: 0 % (ref 0.0–0.2)

## 2019-06-15 LAB — COOXEMETRY PANEL
Carboxyhemoglobin: 1.7 % — ABNORMAL HIGH (ref 0.5–1.5)
Methemoglobin: 1.4 % (ref 0.0–1.5)
O2 Saturation: 55.3 %
Total hemoglobin: 7 g/dL — ABNORMAL LOW (ref 12.0–16.0)

## 2019-06-15 LAB — MAGNESIUM: Magnesium: 2.5 mg/dL — ABNORMAL HIGH (ref 1.7–2.4)

## 2019-06-15 NOTE — Progress Notes (Signed)
[facility] DAILY ICU PROGRESS NOTE                   [address]            [place] [address]          [phone]   44 Days Post-Op Procedure(s) (LRB): VIDEO BRONCHOSCOPY USING DISPOSABLE ANESTHESIA SCOPE (N/A) TRACHEOSTOMY (N/A)  Total Length of Stay:  LOS: 62 days   Subjective: No events  Objective: Vital signs in last 24 hours: Temp:  [94.4 F (34.7 C)-97.5 F (36.4 C)] 97.4 F (36.3 C) (05/30 0754) Pulse Rate:  [72-81] 78 (05/30 0803) Cardiac Rhythm: Normal sinus rhythm (05/30 0800) Resp:  [15-27] 25 (05/30 0803) BP: (81-111)/(51-97) 96/61 (05/30 0803) SpO2:  [94 %-100 %] 100 % (05/30 0803) FiO2 (%):  [40 %] 40 % (05/30 0803) Weight:  [65.8 kg] 65.8 kg (05/30 0500)  Filed Weights   06/13/19 0400 06/14/19 0356 06/15/19 0500  Weight: 66.2 kg 65.6 kg 65.8 kg    Weight change: 0.2 kg   CVP:  [4 mmHg-14 mmHg] 7 mmHg  Intake/Output from previous day: 05/29 0701 - 05/30 0700 In: 2144.8 [I.V.:404.8; NG/GT:1740] Out: 4202 [Stool:410; Chest Tube:10]  Intake/Output this shift: Total I/O In: 68.1 [I.V.:8.1; NG/GT:60] Out: -   Current Meds: Scheduled Meds: . aspirin EC  325 mg Oral Daily   Or  . aspirin  324 mg Per Tube Daily  . B-complex with vitamin C  1 tablet Per Tube Daily  . citalopram  10 mg Per Tube Daily  . collagenase   Topical Daily  . [START ON 06/17/2019] darbepoetin (ARANESP) injection - NON-DIALYSIS  150 mcg Subcutaneous Q Tue-1800  . feeding supplement (PRO-STAT SUGAR FREE 64)  30 mL Per Tube BID  . feeding supplement (VITAL 1.5 CAL)  1,000 mL Per Tube Q24H  . fentaNYL  1 patch Transdermal Q72H  . fludrocortisone  0.2 mg Oral Daily  . gabapentin  100 mg Oral QHS  . insulin aspart  0-6 Units Subcutaneous Q4H  . mouth rinse  15 mL Mouth Rinse Q2H  . melatonin  3 mg Per Tube QHS  . metoCLOPramide (REGLAN) injection  10 mg Intravenous Q6H  . midodrine  20 mg Per Tube Q8H  . nutrition supplement (JUVEN)  1 packet Per Tube BID  .  pantoprazole sodium  40 mg Per Tube BID  . QUEtiapine  25 mg Oral QHS  . rOPINIRole  0.5 mg Oral QHS  . sodium chloride flush  10-40 mL Intracatheter Q12H   Continuous Infusions: .  prismasol BGK 4/2.5 500 mL/hr at 06/15/19 0546  . sodium chloride 250 mL (06/10/19 1053)  . dextrose 5 % and 0.9% NaCl Stopped (06/06/19 0513)  . DOBUTamine 5 mcg/kg/min (06/15/19 0800)  . norepinephrine (LEVOPHED) Adult infusion 4 mcg/min (06/15/19 0800)  . prismasol BGK 0/2.5 300 mL/hr at 06/14/19 2248  . prismasol BGK 4/2.5 1,000 mL/hr at 06/15/19 0653   PRN Meds:.sodium chloride, alum & mag hydroxide-simeth, dextrose, diphenhydrAMINE, fentaNYL (SUBLIMAZE) injection, Gerhardt's butt cream, heparin, heparin, levalbuterol, ondansetron (ZOFRAN) IV, pneumococcal 23 valent vaccine, polyethylene glycol, sennosides, sodium chloride flush, sorbitol, traMADol  Heart: RRR Lungs: Coarse breath sounds bilaterally Abdomen: Soft, bowel sounds present Extremities: Boots in place Wound: Clean and dry.  Right chest tube: Little to no drainage of late  Lab Results: CBC: Recent Labs    06/14/19 0407 06/15/19 0345  WBC 5.5 5.5  HGB 7.1* 7.0*  HCT 23.8* 23.5*  PLT  128* 131*   BMET:  Recent Labs    06/14/19 1754 06/15/19 0345  NA 133* 133*  K 5.1 5.0  CL 99 100  CO2 27 27  GLUCOSE 180* 147*  BUN 33* 37*  CREATININE 1.08 1.06  CALCIUM 8.5* 8.3*    CMET: Lab Results  Component Value Date   WBC 5.5 06/15/2019   HGB 7.0 (L) 06/15/2019   HCT 23.5 (L) 06/15/2019   PLT 131 (L) 06/15/2019   GLUCOSE 147 (H) 06/15/2019   CHOL 174 08/21/2017   TRIG 143 08/21/2017   HDL 57 08/21/2017   LDLCALC 88 08/21/2017   ALT 14 06/06/2019   AST 24 06/06/2019   NA 133 (L) 06/15/2019   K 5.0 06/15/2019   CL 100 06/15/2019   CREATININE 1.06 06/15/2019   BUN 37 (H) 06/15/2019   CO2 27 06/15/2019   TSH 0.925 04/02/2019   INR 1.6 (H) 05/01/2019   HGBA1C 9.0 (H) 04/05/2019   MICROALBUR 30 08/21/2017    PT/INR:    No results for input(s): LABPROT, INR in the last 72 hours. Radiology: No results found.  Assessment/Plan: S/P Procedure(s) (LRB): VIDEO BRONCHOSCOPY USING DISPOSABLE ANESTHESIA SCOPE (N/A) TRACHEOSTOMY (N/A)  Neuro: pain controlled.  Follows commands CV: remains on levophed.  On midodrine, and dobutamine Pulm: remains on the vent Renal: remains on CRRT.  Removed 3.7L, but still positive 1.2L GI: tolerating tubefeeds Dispo: continue ICU care  Lajuana Matte  06/15/2019 10:43 AM

## 2019-06-15 NOTE — Progress Notes (Addendum)
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 Admission Scr was 0.99.  Assessment/ Plan:   1. Acute hypoxic respiratory failure on 05/23/19- on vent via trach. S/p right chest tube. Oliguric/anuricAKI- CRRT started on 4/10.  1. Remains anuric. 2. Attempted transition to iHD on 5/1 but failed 3. Resumed CRRT on 05/23/19 following acute worsening of his oxygenation and hypotension.  4. Paused CRRT 5/23 with plans for trial of iHD to assess if he will tolerate.  Trial 5/25 with measures to help tolerate (low temp, albumin, UF profiling) was not successful 5. Resumed CRRT 5/26 with UF supported by vasopressor titration per CHF team 2. BUN elevation: Mild elevation but not contributing clinically.. Clearance is adequate with CRRT. 3. Vascular access-LIJ tunneledcatheter placed by IR on 05/19/19 4. Anemia of CKD, multifactorial-transfusions per primary team. Hemoglobin 7.8 today. Darbo 161mcg started 5/11 > now on 130mcg 5. CAD- 3 vessel with EF 25% s/p CABG and MVR 05/19/59 complicated by cardiogenic shock as below 6. Acute systolic CHF and cardiogenic shock-  Dobutamine, NE, florinef, midodrine.  Pressor slightly lower today. 7. Electrolytes- K 5 this AM and managed by RRT. No concerns or changes 8. MR s/p MVR 9. Severe debility  Plan is to continue current LOC until grandson graduation HS early 06/2019 then re-evaluate  Subjective:   Patient is resting and does not have any complaints.   Objective:   BP 96/61   Pulse 78   Temp 98.7 F (37.1 C) (Oral)   Resp (!) 25   Ht 5\' 11"  (1.803 m) Comment: measured x 3  Wt 65.8 kg   SpO2 100%   BMI 20.23 kg/m   Intake/Output Summary (Last 24 hours) at 06/15/2019 1201 Last data filed at 06/15/2019 0800 Gross per 24  hour  Intake 1665 ml  Output 3314 ml  Net -1649 ml   Weight change: 0.2 kg  Physical Exam: Gen: frail, thin, cachectic Neck: lef IJTunneled cath(placed 5/3) CVS: normal rate Resp:Coarse bilateral breath sounds, ventilated/trached, rt sided CT Abd: +BS, soft, NT/ND WER:XVQMG trace bilateral lower extremity edema, sacral edema present-atrophied legs  Imaging: DG CHEST PORT 1 VIEW  Result Date: 06/14/2019 CLINICAL DATA:  Chest tube placement EXAM: PORTABLE CHEST 1 VIEW COMPARISON:  06/12/2019 FINDINGS: Tracheostomy tube, central venous line, and feeding tube unchanged. RIGHT chest tube in place. There is bilateral diffuse airspace disease. No pneumothorax appreciated. IMPRESSION: 1. Stable support apparatus. 2. Diffuse bilateral dense airspace disease. 3. RIGHT chest tube in place without appreciable pneumothorax. Electronically Signed   By: Suzy Bouchard M.D.   On: 06/14/2019 09:59    Labs: BMET Recent Labs  Lab 06/12/19 0324 06/12/19 0324 06/12/19 1459 06/13/19 0341 06/13/19 1134 06/13/19 1649 06/14/19 0407 06/14/19 1754 06/15/19 0345  NA 131*   < > 132* 131* 134* 131* 132* 133* 133*  K 5.8*   < > 5.8* 5.2* 5.1 5.2* 5.0 5.1 5.0  CL 94*  --  98 99  --  96* 99 99 100  CO2 28  --  28 29  --  27 28 27 27   GLUCOSE 119*  --  140* 168*  --  184* 174* 180* 147*  BUN 35*  --  34* 34*  --  36* 34* 33* 37*  CREATININE 1.62*  --  1.42* 1.33*  --  1.24 1.10 1.08 1.06  CALCIUM 8.4*  --  8.3* 8.4*  --  8.0* 8.3* 8.5* 8.3*  PHOS 2.8  --  2.5 2.3*  --  3.3 2.5 2.8 2.3*   < > = values in this interval not displayed.   CBC Recent Labs  Lab 06/12/19 0324 06/12/19 0324 06/13/19 0341 06/13/19 1134 06/14/19 0407 06/15/19 0345  WBC 3.6*  --  5.4  --  5.5 5.5  HGB 7.8*   < > 7.8* 8.2* 7.1* 7.0*  HCT 25.4*   < > 25.6* 24.0* 23.8* 23.5*  MCV 95.5  --  97.7  --  97.5 96.7  PLT 111*  --  129*  --  128* 131*   < > = values in this interval not displayed.    Medications:    .  aspirin EC  325 mg Oral Daily   Or  . aspirin  324 mg Per Tube Daily  . B-complex with vitamin C  1 tablet Per Tube Daily  . citalopram  10 mg Per Tube Daily  . collagenase   Topical Daily  . [START ON 06/17/2019] darbepoetin (ARANESP) injection - NON-DIALYSIS  150 mcg Subcutaneous Q Tue-1800  . feeding supplement (PRO-STAT SUGAR FREE 64)  30 mL Per Tube BID  . feeding supplement (VITAL 1.5 CAL)  1,000 mL Per Tube Q24H  . fentaNYL  1 patch Transdermal Q72H  . fludrocortisone  0.2 mg Oral Daily  . gabapentin  100 mg Oral QHS  . insulin aspart  0-6 Units Subcutaneous Q4H  . mouth rinse  15 mL Mouth Rinse Q2H  . melatonin  3 mg Per Tube QHS  . metoCLOPramide (REGLAN) injection  10 mg Intravenous Q6H  . midodrine  20 mg Per Tube Q8H  . nutrition supplement (JUVEN)  1 packet Per Tube BID  . pantoprazole sodium  40 mg Per Tube BID  . QUEtiapine  25 mg Oral QHS  . rOPINIRole  0.5 mg Oral QHS  . sodium chloride flush  10-40 mL Intracatheter Q12H

## 2019-06-15 NOTE — Progress Notes (Signed)
RT note: patient placed on CPAP/PSV of 15/5 at [place].  Currently tolerating well.  Will continue to monitor.

## 2019-06-16 LAB — RENAL FUNCTION PANEL
Albumin: 2 g/dL — ABNORMAL LOW (ref 3.5–5.0)
Albumin: 2.1 g/dL — ABNORMAL LOW (ref 3.5–5.0)
Anion gap: 6 (ref 5–15)
Anion gap: 8 (ref 5–15)
BUN: 37 mg/dL — ABNORMAL HIGH (ref 8–23)
BUN: 39 mg/dL — ABNORMAL HIGH (ref 8–23)
CO2: 26 mmol/L (ref 22–32)
CO2: 28 mmol/L (ref 22–32)
Calcium: 8.2 mg/dL — ABNORMAL LOW (ref 8.9–10.3)
Calcium: 8.3 mg/dL — ABNORMAL LOW (ref 8.9–10.3)
Chloride: 100 mmol/L (ref 98–111)
Chloride: 97 mmol/L — ABNORMAL LOW (ref 98–111)
Creatinine, Ser: 1.08 mg/dL (ref 0.61–1.24)
Creatinine, Ser: 1.18 mg/dL (ref 0.61–1.24)
GFR calc Af Amer: >60 mL/min (ref >60)
GFR calc Af Amer: >60 mL/min (ref >60)
GFR calc non Af Amer: >60 mL/min (ref >60)
GFR calc non Af Amer: >60 mL/min (ref >60)
Glucose, Bld: 162 mg/dL — ABNORMAL HIGH (ref 70–99)
Glucose, Bld: 175 mg/dL — ABNORMAL HIGH (ref 70–99)
Phosphorus: 1.7 mg/dL — ABNORMAL LOW (ref 2.5–4.6)
Phosphorus: 2.6 mg/dL (ref 2.5–4.6)
Potassium: 5.1 mmol/L (ref 3.5–5.1)
Potassium: 5.1 mmol/L (ref 3.5–5.1)
Sodium: 132 mmol/L — ABNORMAL LOW (ref 135–145)
Sodium: 133 mmol/L — ABNORMAL LOW (ref 135–145)

## 2019-06-16 LAB — GLUCOSE, CAPILLARY
Glucose-Capillary: 120 mg/dL — ABNORMAL HIGH (ref 70–99)
Glucose-Capillary: 154 mg/dL — ABNORMAL HIGH (ref 70–99)
Glucose-Capillary: 151 mg/dL — ABNORMAL HIGH (ref 70–99)
Glucose-Capillary: 165 mg/dL — ABNORMAL HIGH (ref 70–99)
Glucose-Capillary: 174 mg/dL — ABNORMAL HIGH (ref 70–99)

## 2019-06-16 LAB — COOXEMETRY PANEL
Carboxyhemoglobin: 1.5 % (ref 0.5–1.5)
Methemoglobin: 0.9 % (ref 0.0–1.5)
O2 Saturation: 60.4 %
Total hemoglobin: 10.1 g/dL — ABNORMAL LOW (ref 12.0–16.0)

## 2019-06-16 LAB — MAGNESIUM: Magnesium: 2.4 mg/dL (ref 1.7–2.4)

## 2019-06-16 MED ORDER — PRISMASOL BGK 0/2.5 32-2.5 MEQ/L IV SOLN
INTRAVENOUS | Status: DC
  Filled 2019-06-16 (×9): qty 5000

## 2019-06-16 MED ORDER — PRISMASOL BGK 0/2.5 32-2.5 MEQ/L IV SOLN
INTRAVENOUS | Status: DC
  Filled 2019-06-16 (×6): qty 5000

## 2019-06-16 MED ORDER — PRISMASOL BGK 0/2.5 32-2.5 MEQ/L IV SOLN
INTRAVENOUS | Status: DC
  Filled 2019-06-16 (×19): qty 5000

## 2019-06-16 NOTE — Progress Notes (Signed)
  Speech Language Pathology Treatment: Nada Boozer Speaking valve  Patient Details Name: Brendan [name] MRN: [id number] DOB: 11-Jun-1955 Today's Date: 06/16/2019 Time: 1220-1238 SLP Time Calculation (min) (ACUTE ONLY): 18 min  Assessment / Plan / Recommendation Clinical Impression  Pt's wife and dtr at bedside for inline PMV usage.  Cuff deflated (#6 Shiley); RT present to make necessary adjustments to vent settings to accommodate leak in system.  Remained on PS. Pt with minimal secretions s/p cuff deflation.  Demonstrated adequate airway patency with Ppeak and expiratory tidal volumes dropping sufficiently to allow placement of PMV inline.  Pt achieved improved quality phonation from last use, requiring intermittent verbal cues to phonate instead of mouthing words/gesturing.  VS were stable and pt participated in conversation via Facetime with grandson, and wife and daughter at bedside.  He appeared comfortable throughout use. Family pleased; pt tired after 15 minutes.  Session was terminated; RT reinflated cuff and restored ventilator settings.  Recommend pt use inline valve when RT is available and family present. SLP will follow.   HPI HPI: 53 yom originally presented with SOB and fatigue at [facility] found to have new HFrEF,  NSTEMI, and bilateral pleural effusions; transferred to [facility] on 3/29 for further cardiac evaluation.  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  Started on lasix and milrinone gtts however developed NSVT.  Was taken 4/1 for placement of IABP and swan for optimization prior to CABG.  On the evening of 4/1, developed worsening respiratory distress and hypoxia. ETT 4/1-4/4, reintubated 4/6 for CABG/MVR; extubated 4/7.  Impella placed 4/2, was removed 4/11. Reintubated 4/12-4/14, extubated to BiPAP. Found pulseless 4/14 VT requiring 1 minute of CPR and epinephrine to achieve ROSC. Reintubated due to resp distress 4/15;  trach 4/16. Dx include acute on chronic hypoxemic and hypercarbic resp  failure after CABG - pulmonary edema and pleural effusions, difficult wean from vent; metabolic encephalopathy; ESRD -CRRT 4/10-4/29, back on CRRT 5/7; profound deconditioning and severe protein calorie malnutrition. Palliative medicine is following.         SLP Plan  Continue with current plan of care       Recommendations         Patient may use Passy-Muir Speech Valve: (With RT support/supervision) PMSV Supervision: Full         Oral Care Recommendations: Oral care QID SLP Visit Diagnosis: Aphonia (R49.1) Plan: Continue with current plan of care       GO              Timber Marshman L. Tivis Ringer, [place] Office number [phone] Pager [phone]   Juan Quam Laurice 06/16/2019, 1:51 PM

## 2019-06-16 NOTE — Progress Notes (Signed)
Patient ID: Brendan [name], male   DOB: April 26, 1955, 64 y.o.   MRN: [id number] EVENING ROUNDS NOTE :     [address]       [place] [address]             [phone]                 45 Days Post-Op Procedure(s) (LRB): VIDEO BRONCHOSCOPY USING DISPOSABLE ANESTHESIA SCOPE (N/A) TRACHEOSTOMY (N/A)  Total Length of Stay:  LOS: 63 days  BP (!) 84/73   Pulse 80   Temp (!) 96.3 F (35.7 C) (Axillary)   Resp 19   Ht 5\' 11"  (1.803 m) Comment: measured x 3  Wt 64.3 kg   SpO2 98%   BMI 19.77 kg/m   .Intake/Output      05/31 0701 - 06/01 0700   I.V. (mL/kg) 108.5 (1.7)   NG/GT 1440   Total Intake(mL/kg) 1548.5 (24.1)   Emesis/NG output 0   Other 1695   Stool    Chest Tube 0   Total Output 1695   Net -146.5       Emesis Occurrence 1 x     . sodium chloride 250 mL (06/10/19 1053)  . dextrose 5 % and 0.9% NaCl Stopped (06/06/19 0513)  . DOBUTamine 5 mcg/kg/min (06/16/19 1900)  . norepinephrine (LEVOPHED) Adult infusion 5 mcg/min (06/16/19 1900)  . prismasol BGK 2/2.5 dialysis solution 1,000 mL/hr at 06/16/19 1540  . prismasol BGK 2/2.5 replacement solution 500 mL/hr at 06/16/19 1026  . prismasol BGK 2/2.5 replacement solution 300 mL/hr at 06/16/19 1027     Lab Results  Component Value Date   WBC 5.5 06/15/2019   HGB 7.0 (L) 06/15/2019   HCT 23.5 (L) 06/15/2019   PLT 131 (L) 06/15/2019   GLUCOSE 175 (H) 06/16/2019   CHOL 174 08/21/2017   TRIG 143 08/21/2017   HDL 57 08/21/2017   LDLCALC 88 08/21/2017   ALT 14 06/06/2019   AST 24 06/06/2019   NA 133 (L) 06/16/2019   K 5.1 06/16/2019   CL 97 (L) 06/16/2019   CREATININE 1.18 06/16/2019   BUN 39 (H) 06/16/2019   CO2 28 06/16/2019   TSH 0.925 03/31/2019   INR 1.6 (H) 05/01/2019   HGBA1C 9.0 (H) 03/21/2019   MICROALBUR 30 08/21/2017   No change Awake and alert this evening, daughter with him tonight    Grace Isaac MD  Beeper [phone] Office [phone] 06/16/2019 7:37 PM

## 2019-06-16 NOTE — Progress Notes (Addendum)
Brendan [name] PROGRESS NOTE  Assessment/ Plan: Pt is a 64 y.o. yo male admitted to [facility] 3/26 with SOB, new low EF and NSTEMI txd to [facility] 3/29: LHC 3 vessel CAD s/p CABG and MVR complicated by cardiogenic shock, IABP, Impella, and worsening volume status/respiratory status and started on CRRT 04/26/19.Admission Scr was 0.99.  #Anuric AKI: CRRT started on 4/10.  Tried intermittent hemodialysis multiple times unfortunately he did not tolerate because of worsening hypotension.  The CRRT was resumed on 5/26 with inotropes and pressor support.  Potassium level 5.1 therefore we will change CRRT prescription.    #Acute hypoxic respiratory failure: On vent, tracheostomy and has right chest tube.  Pulmonary is following.  Volume status acceptable.  #Cardiogenic shock/acute systolic CHF: Impella placed on 4/2 and removed on 4/9.  Status post CABG/MV repair currently on dobutamine, Levophed.  CVP around 7-8.  Discussed with cardiology.  #Anemia due to acute blood loss and critical illness: Monitor hemoglobin.  Transfuse as needed.  #Hyponatremia: Managed with CRRT.  #Severe debility/malnutrition.  Subjective: Seen and examined in ICU.  Remains intubated and sedated.  Currently on dobutamine, Levophed.  No new event. Objective Vital signs in last 24 hours: Vitals:   06/16/19 0700 06/16/19 0730 06/16/19 0800 06/16/19 0802  BP: (!) 100/57 105/61 (!) 135/124 (!) 88/59  Pulse: 87 86 85 84  Resp: (!) 22 20 (!) 25 19  Temp:  98.4 F (36.9 C)    TempSrc:  Axillary    SpO2: 100% 100% 100% 98%  Weight:      Height:       Weight change: -1.5 kg  Intake/Output Summary (Last 24 hours) at 06/16/2019 0828 Last data filed at 06/16/2019 0800 Gross per 24 hour  Intake 1659.57 ml  Output 1968 ml  Net -308.43 ml       Labs: Basic Metabolic Panel: Recent Labs  Lab 06/15/19 0345 06/15/19 1622 06/16/19 0338  NA 133* 133* 132*  K 5.0 5.0 5.1  CL 100 101 100  CO2 27 28 26    GLUCOSE 147* 206* 162*  BUN 37* 40* 37*  CREATININE 1.06 1.05 1.08  CALCIUM 8.3* 8.1* 8.2*  PHOS 2.3* 2.4* 1.7*   Liver Function Tests: Recent Labs  Lab 06/15/19 0345 06/15/19 1622 06/16/19 0338  ALBUMIN 2.0* 2.1* 2.0*   No results for input(s): LIPASE, AMYLASE in the last 168 hours. No results for input(s): AMMONIA in the last 168 hours. CBC: Recent Labs  Lab 06/11/19 0330 06/11/19 0921 06/12/19 0324 06/12/19 0324 06/13/19 0341 06/13/19 0341 06/13/19 1134 06/14/19 0407 06/15/19 0345  WBC 4.8  --  3.6*   < > 5.4  --   --  5.5 5.5  HGB 7.8*   < > 7.8*   < > 7.8*   < > 8.2* 7.1* 7.0*  HCT 25.3*   < > 25.4*   < > 25.6*   < > 24.0* 23.8* 23.5*  MCV 95.8  --  95.5  --  97.7  --   --  97.5 96.7  PLT 114*  --  111*   < > 129*  --   --  128* 131*   < > = values in this interval not displayed.   Cardiac Enzymes: No results for input(s): CKTOTAL, CKMB, CKMBINDEX, TROPONINI in the last 168 hours. CBG: Recent Labs  Lab 06/15/19 1537 06/15/19 1940 06/15/19 2307 06/16/19 0309 06/16/19 0734  GLUCAP 176* 166* 88 120* 154*    Iron Studies: No results for  input(s): IRON, TIBC, TRANSFERRIN, FERRITIN in the last 72 hours. Studies/Results: No results found.  Medications: Infusions: .  prismasol BGK 4/2.5 500 mL/hr at 06/16/19 0302  . sodium chloride 250 mL (06/10/19 1053)  . dextrose 5 % and 0.9% NaCl Stopped (06/06/19 0513)  . DOBUTamine 5 mcg/kg/min (06/16/19 0800)  . norepinephrine (LEVOPHED) Adult infusion 5 mcg/min (06/16/19 0800)  . prismasol BGK 0/2.5 300 mL/hr at 06/15/19 1650  . prismasol BGK 4/2.5 1,000 mL/hr at 06/16/19 0430    Scheduled Medications: . aspirin EC  325 mg Oral Daily   Or  . aspirin  324 mg Per Tube Daily  . B-complex with vitamin C  1 tablet Per Tube Daily  . citalopram  10 mg Per Tube Daily  . collagenase   Topical Daily  . [START ON 06/17/2019] darbepoetin (ARANESP) injection - NON-DIALYSIS  150 mcg Subcutaneous Q Tue-1800  . feeding  supplement (PRO-STAT SUGAR FREE 64)  30 mL Per Tube BID  . feeding supplement (VITAL 1.5 CAL)  1,000 mL Per Tube Q24H  . fentaNYL  1 patch Transdermal Q72H  . fludrocortisone  0.2 mg Oral Daily  . gabapentin  100 mg Oral QHS  . insulin aspart  0-6 Units Subcutaneous Q4H  . mouth rinse  15 mL Mouth Rinse Q2H  . melatonin  3 mg Per Tube QHS  . metoCLOPramide (REGLAN) injection  10 mg Intravenous Q6H  . midodrine  20 mg Per Tube Q8H  . nutrition supplement (JUVEN)  1 packet Per Tube BID  . pantoprazole sodium  40 mg Per Tube BID  . QUEtiapine  25 mg Oral QHS  . rOPINIRole  0.5 mg Oral QHS  . sodium chloride flush  10-40 mL Intracatheter Q12H    have reviewed scheduled and prn medications.  Physical Exam: General: Critically ill looking male lying on bed, sedated and intubated Heart:RRR, s1s2 nl Lungs: Coarse breath sound, had a chest tube Abdomen:soft, Non-tender, non-distended Extremities:No edema Dialysis Access: Left IJ TDC placed by IR on 5/3/201.  [place] Catarino Prasad Taimi Towe 06/16/2019,8:28 AM  LOS: 46 days  Pager: [phone]

## 2019-06-16 NOTE — Consult Note (Signed)
[place] Nurse wound follow up Patient receiving care in [place].  Assisted with turning by primary RN. Wound type: unstageable wound to sacral/coccyx area Measurement: 4 cm x 4.5 cm Wound bed: 100% dry, light brown Drainage (amount, consistency, odor) none Periwound: intact Dressing procedure/placement/frequency:  Apply Santyl to sacra/coccyx wound in a nickel thick layer. Cover with a saline moistened gauze, then foam dressing.  Change daily. PT to perform when they do hydrotherapy, Primary RN on day(s) hydro not performed.  I have requested PT to perform hydrotherapy 6 days/week. Monitor the wound area(s) for worsening of condition such as: Signs/symptoms of infection,  Increase in size,  Development of or worsening of odor, Development of pain, or increased pain at the affected locations.  Notify the medical team if any of these develop. Val Riles, RN, MSN, CWOCN, CNS-BC, pager [phone]

## 2019-06-16 NOTE — Progress Notes (Signed)
Patient ID: Brendan [name], male   DOB: [birth year], 64 y.o.   MRN: [id number] TCTS DAILY ICU PROGRESS NOTE                   Brendan [name]            Brendan [name] [id number]          [id number]   45 Days Post-Op Procedure(s) (LRB): VIDEO BRONCHOSCOPY USING DISPOSABLE ANESTHESIA SCOPE (N/A) TRACHEOSTOMY (N/A)    56 days postop  DATE OF PROCEDURE: 05/10/2019 OPERATIONS: 1. Coronary artery bypass grafting x3 (left internal mammary artery to left anterior descending, saphenous vein graft to posterior descending, saphenous vein graft to second diagonal). 2. Mitral valve annuloplasty using a 26 mm Edwards Physio II annuloplasty ring, serial [id number]. 3. Endoscopic harvest of left leg greater saphenous vein.    Total Length of Stay:  LOS: 63 days   Subjective: Patient remains unchanged, continues on vent  Objective: Vital signs in last 24 hours: Temp:  [94.5 F (34.7 C)-99.7 F (37.6 C)] 98.4 F (36.9 C) (05/31 0730) Pulse Rate:  [73-92] 84 (05/31 0802) Cardiac Rhythm: Normal sinus rhythm (05/31 0800) Resp:  [16-27] 19 (05/31 0802) BP: (76-135)/(30-124) 88/59 (05/31 0802) SpO2:  [98 %-100 %] 98 % (05/31 0802) FiO2 (%):  [40 %] 40 % (05/31 0800) Weight:  [64.3 kg] 64.3 kg (05/31 0349)  Filed Weights   06/14/19 0356 06/15/19 0500 06/16/19 0349  Weight: 65.6 kg 65.8 kg 64.3 kg    Weight change: -1.5 kg   Hemodynamic parameters for last 24 hours: CVP:  [5 mmHg-16 mmHg] 6 mmHg  Intake/Output from previous day: 05/30 0701 - 05/31 0700 In: 1658.6 [I.V.:218.6; NG/GT:1440] Out: 2025 [Stool:200]  Intake/Output this shift: Total I/O In: 69.1 [I.V.:9.1; NG/GT:60] Out: 103 [Other:103]  Current Meds: Scheduled Meds: . aspirin EC  325 mg Oral Daily   Or  . aspirin  324 mg Per Tube Daily  . B-complex with vitamin C  1 tablet Per Tube Daily  . citalopram  10 mg Per Tube Daily  . collagenase   Topical Daily  . [START ON 06/17/2019] darbepoetin (ARANESP)  injection - NON-DIALYSIS  150 mcg Subcutaneous Q Tue-1800  . feeding supplement (PRO-STAT SUGAR FREE 64)  30 mL Per Tube BID  . feeding supplement (VITAL 1.5 CAL)  1,000 mL Per Tube Q24H  . fentaNYL  1 patch Transdermal Q72H  . fludrocortisone  0.2 mg Oral Daily  . gabapentin  100 mg Oral QHS  . insulin aspart  0-6 Units Subcutaneous Q4H  . mouth rinse  15 mL Mouth Rinse Q2H  . melatonin  3 mg Per Tube QHS  . metoCLOPramide (REGLAN) injection  10 mg Intravenous Q6H  . midodrine  20 mg Per Tube Q8H  . nutrition supplement (JUVEN)  1 packet Per Tube BID  . pantoprazole sodium  40 mg Per Tube BID  . QUEtiapine  25 mg Oral QHS  . rOPINIRole  0.5 mg Oral QHS  . sodium chloride flush  10-40 mL Intracatheter Q12H   Continuous Infusions: .  prismasol BGK 4/2.5 500 mL/hr at 06/16/19 0302  . sodium chloride 250 mL (06/10/19 1053)  . dextrose 5 % and 0.9% NaCl Stopped (06/06/19 0513)  . DOBUTamine 5 mcg/kg/min (06/16/19 0800)  . norepinephrine (LEVOPHED) Adult infusion 5 mcg/min (06/16/19 0800)  . prismasol BGK 0/2.5 300 mL/hr at 06/15/19 1650  . prismasol BGK 4/2.5 1,000 mL/hr at 06/16/19 0430   PRN Meds:.sodium chloride,  alum & mag hydroxide-simeth, dextrose, diphenhydrAMINE, fentaNYL (SUBLIMAZE) injection, Gerhardt's butt cream, heparin, heparin, levalbuterol, ondansetron (ZOFRAN) IV, pneumococcal 23 valent vaccine, polyethylene glycol, sennosides, sodium chloride flush, sorbitol, traMADol  General appearance: alert and cooperative Neurologic: intact Heart: regular rate and rhythm, S1, S2 normal, no murmur, click, rub or gallop Lungs: diminished breath sounds base - Bilateral Wound: Sternal incision entered  Lab Results: CBC: Recent Labs    06/14/19 0407 06/15/19 0345  WBC 5.5 5.5  HGB 7.1* 7.0*  HCT 23.8* 23.5*  PLT 128* 131*   BMET:  Recent Labs    06/15/19 1622 06/16/19 0338  NA 133* 132*  K 5.0 5.1  CL 101 100  CO2 28 26  GLUCOSE 206* 162*  BUN 40* 37*  CREATININE  1.05 1.08  CALCIUM 8.1* 8.2*    CMET: Lab Results  Component Value Date   WBC 5.5 06/15/2019   HGB 7.0 (L) 06/15/2019   HCT 23.5 (L) 06/15/2019   PLT 131 (L) 06/15/2019   GLUCOSE 162 (H) 06/16/2019   CHOL 174 08/21/2017   TRIG 143 08/21/2017   HDL 57 08/21/2017   LDLCALC 88 08/21/2017   ALT 14 06/06/2019   AST 24 06/06/2019   NA 132 (L) 06/16/2019   K 5.1 06/16/2019   CL 100 06/16/2019   CREATININE 1.08 06/16/2019   BUN 37 (H) 06/16/2019   CO2 26 06/16/2019   TSH 0.925 03/26/2019   INR 1.6 (H) 05/01/2019   HGBA1C 9.0 (H) 04/09/2019   MICROALBUR 30 08/21/2017      PT/INR: No results for input(s): LABPROT, INR in the last 72 hours. Radiology: No results found.   Assessment/Plan:  56 days postop  DATE OF PROCEDURE: 05/10/2019 OPERATIONS: 1. Coronary artery bypass grafting x3 (left internal mammary artery to left anterior descending, saphenous vein graft to posterior descending, saphenous vein graft to second diagonal). 2. Mitral valve annuloplasty using a 26 mm Edwards Physio II annuloplasty ring, serial [id number]. 3. Endoscopic harvest of left leg greater saphenous vein.  Continue supportive care   Brendan [name] 06/16/2019 8:30 AM

## 2019-06-16 NOTE — Progress Notes (Signed)
RT assisted Speech with placement of inline speaking valve. Pt cuff deflated and PMV inserted inline. Pt tolerated well for approximately 15 minutes, family at bedside. Inline PMV taken out and cuff inflated. Pt tolerated well. Pt returned to vent on pressure support. RT will continue to monitor.

## 2019-06-17 ENCOUNTER — Inpatient Hospital Stay (HOSPITAL_COMMUNITY): Payer: Medicaid Other

## 2019-06-17 LAB — MAGNESIUM: Magnesium: 2.4 mg/dL (ref 1.7–2.4)

## 2019-06-17 LAB — GLUCOSE, CAPILLARY
Glucose-Capillary: 138 mg/dL — ABNORMAL HIGH (ref 70–99)
Glucose-Capillary: 152 mg/dL — ABNORMAL HIGH (ref 70–99)
Glucose-Capillary: 157 mg/dL — ABNORMAL HIGH (ref 70–99)
Glucose-Capillary: 166 mg/dL — ABNORMAL HIGH (ref 70–99)
Glucose-Capillary: 178 mg/dL — ABNORMAL HIGH (ref 70–99)
Glucose-Capillary: 179 mg/dL — ABNORMAL HIGH (ref 70–99)

## 2019-06-17 LAB — RENAL FUNCTION PANEL
Albumin: 2 g/dL — ABNORMAL LOW (ref 3.5–5.0)
Albumin: 2.1 g/dL — ABNORMAL LOW (ref 3.5–5.0)
Anion gap: 7 (ref 5–15)
Anion gap: 8 (ref 5–15)
BUN: 41 mg/dL — ABNORMAL HIGH (ref 8–23)
BUN: 51 mg/dL — ABNORMAL HIGH (ref 8–23)
CO2: 25 mmol/L (ref 22–32)
CO2: 29 mmol/L (ref 22–32)
Calcium: 8.3 mg/dL — ABNORMAL LOW (ref 8.9–10.3)
Calcium: 8.5 mg/dL — ABNORMAL LOW (ref 8.9–10.3)
Chloride: 99 mmol/L (ref 98–111)
Chloride: 95 mmol/L — ABNORMAL LOW (ref 98–111)
Creatinine, Ser: 1.16 mg/dL (ref 0.61–1.24)
Creatinine, Ser: 1.07 mg/dL (ref 0.61–1.24)
GFR calc Af Amer: >60 mL/min (ref >60)
GFR calc Af Amer: >60 mL/min (ref >60)
GFR calc non Af Amer: >60 mL/min (ref >60)
GFR calc non Af Amer: >60 mL/min (ref >60)
Glucose, Bld: 162 mg/dL — ABNORMAL HIGH (ref 70–99)
Glucose, Bld: 144 mg/dL — ABNORMAL HIGH (ref 70–99)
Phosphorus: 2.4 mg/dL — ABNORMAL LOW (ref 2.5–4.6)
Phosphorus: 2.7 mg/dL (ref 2.5–4.6)
Potassium: 4.6 mmol/L (ref 3.5–5.1)
Potassium: 4.4 mmol/L (ref 3.5–5.1)
Sodium: 131 mmol/L — ABNORMAL LOW (ref 135–145)
Sodium: 132 mmol/L — ABNORMAL LOW (ref 135–145)

## 2019-06-17 LAB — COOXEMETRY PANEL
Carboxyhemoglobin: 1.6 % — ABNORMAL HIGH (ref 0.5–1.5)
Carboxyhemoglobin: 2 % — ABNORMAL HIGH (ref 0.5–1.5)
Methemoglobin: 1.4 % (ref 0.0–1.5)
Methemoglobin: 1.8 % — ABNORMAL HIGH (ref 0.0–1.5)
O2 Saturation: 48.5 %
O2 Saturation: 58.6 %
Total hemoglobin: 9.8 g/dL — ABNORMAL LOW (ref 12.0–16.0)
Total hemoglobin: 5.3 g/dL — CL (ref 12.0–16.0)

## 2019-06-17 LAB — CBC
HCT: 22.2 % — ABNORMAL LOW (ref 39.0–52.0)
Hemoglobin: 6.6 g/dL — CL (ref 13.0–17.0)
MCH: 28.9 pg (ref 26.0–34.0)
MCHC: 29.7 g/dL — ABNORMAL LOW (ref 30.0–36.0)
MCV: 97.4 fL (ref 80.0–100.0)
Platelets: 177 10*3/uL (ref 150–400)
RBC: 2.28 MIL/uL — ABNORMAL LOW (ref 4.22–5.81)
RDW: 15.6 % — ABNORMAL HIGH (ref 11.5–15.5)
WBC: 7.9 10*3/uL (ref 4.0–10.5)
nRBC: 0 % (ref 0.0–0.2)

## 2019-06-17 LAB — PREPARE RBC (CROSSMATCH)

## 2019-06-17 MED ORDER — SODIUM CHLORIDE 0.9% IV SOLUTION: Freq: Once | INTRAVENOUS | Status: AC

## 2019-06-17 NOTE — Progress Notes (Signed)
[facility] NEPHROLOGY PROGRESS NOTE  Assessment/ Plan: Pt is a 64 y.o. 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  #Anuric AKI: CRRT started on 4/10.  Tried intermittent hemodialysis multiple times unfortunately he did not tolerate because of worsening hypotension.  The CRRT was resumed on 5/26 with inotropes and pressor support.  Currently on 2K bath with acceptable potassium level.  Continue to monitor lab.   #Acute hypoxic respiratory failure: On vent, tracheostomy and has right chest tube.  Pulmonary is following.  Volume status acceptable.  #Cardiogenic shock/acute systolic CHF: Impella placed on 4/2 and removed on 4/9.  Status post CABG/MV repair currently on dobutamine, Levophed.    #Anemia due to acute blood loss and critical illness: Monitor hemoglobin.  Transfuse as needed.  #Hyponatremia: Managed with CRRT.  #Severe debility/malnutrition.  PT evaluation  #Disposition: He is very deconditioned.  Blood pressure very low despite of 2 pressors.  He did not tolerate intermittent hemodialysis before and now on CRRT.  I recommend goals of care discussion including palliative care consult.  At the moment, he is not a candidate for outpatient dialysis.  Subjective: Seen and examined in ICU.  Remains on vent.  Also on dobutamine and Levophed.  Blood pressure soft.  He is alert awake. Objective Vital signs in last 24 hours: Vitals:   06/17/19 0805 06/17/19 0815 06/17/19 0830 06/17/19 0856  BP: 95/65 (!) 87/53 (!) 93/59   Pulse:  79 80 82  Resp: (!) 22 18 20 15   Temp:  97.7 F (36.5 C)  97.6 F (36.4 C)  TempSrc:  Oral  Oral  SpO2: 100% 100% 100% 100%  Weight:      Height:       Weight change: 0.8 kg  Intake/Output Summary (Last 24 hours) at  06/17/2019 0858 Last data filed at 06/17/2019 0800 Gross per 24 hour  Intake 2407.95 ml  Output 2772 ml  Net -364.05 ml       Labs: Basic Metabolic Panel: Recent Labs  Lab 06/16/19 0338 06/16/19 1548 06/17/19 0428  NA 132* 133* 131*  K 5.1 5.1 4.6  CL 100 97* 99  CO2 26 28 25   GLUCOSE 162* 175* 162*  BUN 37* 39* 41*  CREATININE 1.08 1.18 1.16  CALCIUM 8.2* 8.3* 8.3*  PHOS 1.7* 2.6 2.4*   Liver Function Tests: Recent Labs  Lab 06/16/19 0338 06/16/19 1548 06/17/19 0428  ALBUMIN 2.0* 2.1* 2.0*   No results for input(s): LIPASE, AMYLASE in the last 168 hours. No results for input(s): AMMONIA in the last 168 hours. CBC: Recent Labs  Lab 06/12/19 0324 06/12/19 0324 06/13/19 0341 06/13/19 1134 06/14/19 0407 06/15/19 0345 06/17/19 0428  WBC 3.6*   < > 5.4  --  5.5 5.5 7.9  HGB 7.8*   < > 7.8*   < > 7.1* 7.0* 6.6*  HCT 25.4*   < > 25.6*   < > 23.8* 23.5* 22.2*  MCV 95.5  --  97.7  --  97.5 96.7 97.4  PLT 111*   < > 129*  --  128* 131* 177   < > = values in this interval not displayed.   Cardiac Enzymes: No results for input(s): CKTOTAL, CKMB, CKMBINDEX, TROPONINI in the last 168 hours. CBG: Recent Labs  Lab 06/16/19  1542 06/16/19 2006 06/17/19 0006 06/17/19 0410 06/17/19 0744  GLUCAP 165* 174* 178* 157* 152*    Iron Studies: No results for input(s): IRON, TIBC, TRANSFERRIN, FERRITIN in the last 72 hours. Studies/Results: DG Chest Port 1 View  Result Date: 06/17/2019 CLINICAL DATA:  Chest tube.  Tracheostomy tube. EXAM: PORTABLE CHEST 1 VIEW COMPARISON:  06/14/2019. FINDINGS: Tracheostomy tube, feeding tube, left subclavian dual lumen central line, right PICC line, and right chest tube in stable position. Prior CABG and cardiac valve replacement. Stable cardiomegaly. Diffuse bilateral interstitial infiltrates/edema again noted, slight improvement in aeration from prior exam. No pleural effusion or pneumothorax. Surgical clips noted over the right chest and  upper abdomen. IMPRESSION: 1. Lines and tubes including right chest tube in stable position. No pneumothorax. 2.  Prior CABG and cardiac valve replacement.  Stable cardiomegaly. 3. Diffuse bilateral interstitial infiltrates/edema again noted, slight improvement in aeration from prior exam. Electronically Signed   By: Nelson   On: 06/17/2019 06:06    Medications: Infusions: . sodium chloride 250 mL (06/10/19 1053)  . dextrose 5 % and 0.9% NaCl Stopped (06/06/19 0513)  . DOBUTamine 5 mcg/kg/min (06/17/19 0700)  . norepinephrine (LEVOPHED) Adult infusion 5 mcg/min (06/17/19 0700)  . prismasol BGK 2/2.5 dialysis solution 1,000 mL/hr at 06/17/19 0842  . prismasol BGK 2/2.5 replacement solution 500 mL/hr at 06/17/19 0808  . prismasol BGK 2/2.5 replacement solution 300 mL/hr at 06/16/19 1027    Scheduled Medications: . aspirin EC  325 mg Oral Daily   Or  . aspirin  324 mg Per Tube Daily  . B-complex with vitamin C  1 tablet Per Tube Daily  . citalopram  10 mg Per Tube Daily  . collagenase   Topical Daily  . darbepoetin (ARANESP) injection - NON-DIALYSIS  150 mcg Subcutaneous Q Tue-1800  . feeding supplement (PRO-STAT SUGAR FREE 64)  30 mL Per Tube BID  . feeding supplement (VITAL 1.5 CAL)  1,000 mL Per Tube Q24H  . fentaNYL  1 patch Transdermal Q72H  . fludrocortisone  0.2 mg Oral Daily  . gabapentin  100 mg Oral QHS  . insulin aspart  0-6 Units Subcutaneous Q4H  . mouth rinse  15 mL Mouth Rinse Q2H  . melatonin  3 mg Per Tube QHS  . metoCLOPramide (REGLAN) injection  10 mg Intravenous Q6H  . midodrine  20 mg Per Tube Q8H  . nutrition supplement (JUVEN)  1 packet Per Tube BID  . pantoprazole sodium  40 mg Per Tube BID  . QUEtiapine  25 mg Oral QHS  . rOPINIRole  0.5 mg Oral QHS  . sodium chloride flush  10-40 mL Intracatheter Q12H    have reviewed scheduled and prn medications.  Physical Exam: General: Critically ill looking male lying on bed,  Neck: Tracheostomy and on  vent. Heart:RRR, s1s2 nl Lungs: Coarse breath sound, had a chest tube Abdomen:soft, Non-tender, non-distended Extremities:No edema Dialysis Access: Left IJ TDC placed by IR on 5/3/201.  Mija Effertz Prasad Jef Futch 06/17/2019,8:58 AM  LOS: 64 days  Pager: [phone]

## 2019-06-17 NOTE — Progress Notes (Signed)
Palliative Medicine RN Note: Rec'd request from PMT NP Elmo Putt to help make sure grandson's graduation tomorrow evening is somehow visible to pt. Spoke with RN Cybil; she reports family will be here later.  When they arrive, she will talk to them about logistics/needing someone there to stream it/etc, and will call our office if there are problems getting it arranged.  Marjie Skiff Chyrl Elwell, RN, BSN, [facility] Palliative Medicine Team 06/17/2019 12:43 PM Office [phone]

## 2019-06-17 NOTE — Progress Notes (Signed)
Physical Therapy Wound Treatment Patient Details  Name: Brendan [name] MRN: [id number] Date of Birth: [birth year]  Today's Date: 06/17/2019 Time: 6578-4696 Time Calculation (min): 45 min  Subjective  Patient and Family Stated Goals: to watch grandson graduation via [place] tomorrow Prior Treatments: dressing changes   Pain Score: Pt reports pain in hips on entry, Facial Pain Score 8/10 with pulsed lavage, 2/10 at end of session   Wound Assessment  Pressure Injury 05/30/19 Coccyx Medial;Mid Stage 3 -  Full thickness tissue loss. Subcutaneous fat may be visible but bone, tendon or muscle are NOT exposed. Previously deep tissue wound from 05/13/19 (Active)  Wound Image   06/17/19 1000  Dressing Type Foam - Lift dressing to assess site every shift;Liquid skin adhesive;Moist to moist 06/17/19 1000  Dressing Clean;Dry;Intact 06/17/19 1000  Dressing Change Frequency Daily 06/17/19 1000  State of Healing Non-healing 06/17/19 1000  Site / Wound Assessment Pink;Red;Yellow;Clean;Pale 06/17/19 1000  % Wound base Red or Granulating 0% 06/17/19 1000  % Wound base Yellow/Fibrinous Exudate 95% 06/17/19 1000  % Wound base Black/Eschar 5% 06/17/19 1000  Peri-wound Assessment Intact;Erythema (blanchable) 06/17/19 1000  Wound Length (cm) 4.7 cm 06/17/19 1000  Wound Width (cm) 3.8 cm 06/17/19 1000  Wound Depth (cm) 0 cm 06/11/19 2300  Wound Surface Area (cm^2) 17.86 cm^2 06/17/19 1000  Wound Volume (cm^3) 0 cm^3 06/11/19 2300  Margins Unattached edges (unapproximated) 06/17/19 1000  Drainage Amount None 06/17/19 1000  Drainage Description No odor;Serosanguineous 06/17/19 1000  Treatment Debridement (Selective);Hydrotherapy (Pulse lavage);Packing (Saline gauze) 06/17/19 1000   Santyl applied to necrotic tissue  Hydrotherapy Pulsed lavage therapy - wound location: sacrum Pulsed Lavage with Suction (psi): 12 psi(4-12 psi) Pulsed Lavage with Suction - Normal Saline Used: 1000 mL Pulsed Lavage Tip: Tip  with splash shield Selective Debridement Selective Debridement - Location: sacrum Selective Debridement - Tools Used: Scalpel;Forceps(utilized scalpel to score eschar ) Selective Debridement - Tissue Removed: stringy yellow/white slough   Wound Assessment and Plan  Wound Therapy - Assess/Plan/Recommendations Wound Therapy - Clinical Statement: Wound is covered in semi-soft yellow/white adhered slough material that will benefit from hydrotherapy, santyl application and selective debridement to reduce bioburden and improve wound healing  Wound Therapy - Functional Problem List: decreased mobility, increased kidney and lung supportive machinery limiting mobility,   Factors Delaying/Impairing Wound Healing: Immobility;Multiple medical problems;Other (comment)(decreased nutrition) Hydrotherapy Plan: Debridement;Patient/family education;Pulsatile lavage with suction;Dressing change Wound Therapy - Frequency: 6X / week Wound Therapy - Follow Up Recommendations: Skilled nursing facility Wound Plan: see above  Wound Therapy Goals- Improve the function of patient's integumentary system by progressing the wound(s) through the phases of wound healing (inflammation - proliferation - remodeling) by: Decrease Necrotic Tissue to: 75 Increase Granulation Tissue to: 20 Decrease Length/Width/Depth by (cm): 0.5 Goals/treatment plan/discharge plan were made with and agreed upon by patient/family: Yes Time For Goal Achievement: 7 days Wound Therapy - Potential for Goals: Good  Goals will be updated until maximal potential achieved or discharge criteria met.  Discharge criteria: when goals achieved, discharge from hospital, MD decision/surgical intervention, no progress towards goals, refusal/missing three consecutive treatments without notification or medical reason.  GP    Dani Gobble. Migdalia Dk PT, DPT Acute Rehabilitation Services Pager [phone] Office [phone]  [place] 06/17/2019, 10:20 AM

## 2019-06-17 NOTE — Progress Notes (Signed)
PT Cancellation Note  Patient Details Name: Brendan [name] MRN: [id number] DOB: 1955/01/29   Cancelled Treatment:    Reason Eval/Treat Not Completed: Patient declined, no reason specified(pt reports pain at his waist and legs and refused mobility or HEp at this time)   [place] 06/17/2019, 7:47 AM Bayard Males, PT Acute Rehabilitation Services Pager: [phone] Office: [phone]

## 2019-06-17 NOTE — Progress Notes (Signed)
NAME:  Brendan [name], MRN:  [id number], DOB:  1955-07-29, LOS: 20 ADMISSION DATE:  04/05/2019, CONSULTATION DATE:  04/17/19 REFERRING MD:  Dr. Haroldine Laws, CHIEF COMPLAINT:  Respiratory distress   Brief History   50 yoM originally presented with SOB and fatigue at [facility] found to have new HFrEF, NSTEMI, and bilateral pleural effusions transferred to [facility] on 3/29 for further cardiac evaluation. 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 Started on lasix and milrinone gtts however developed NSVT. Was taken 4/1 for placement of IABP and swan for optimization prior to CABG. Was on precedex for agitation/ confusion, some concern for DTs. On the evening of 4/1, patient developed worsening respiratory distress and hypoxia, PCCM consulted for intubation and vent management.  Patient was extubated on 4/14 and developed worsening hypoxia on BiPAP. Overnight he developed pulseless VT requiring 1 minute of CPR and epinephrine to achieve ROSC. Neurovascularly intact afterward. Subsequently had increased work of breathing and worsening respiratory distress requiring intubation. Patient had tracheostomy performed on 05/06/2019. Trying to wean off ventilator.   Past Medical History  Tobacco abuse, HTN, poorly controlled diabetes, diabetic neuropathy  Significant Hospital Events   3/30 Lt heart cath/TEE performed 4/1 Intubated, RHC/IABP/PA cath 4/2 Impella placement 4/3 febrile over evening and night. Blood cultures obtained. Vanc/cefepime started. Hematuria--heparin held. 4/4 Extubated,abx transitioned to rocephin. Heparin restarted 4/6 Re-intubated for CABG/MVR 4/7 Extubated from CABG to BiPAP 4/9 Impella removed  4/9 TEE 4/10 CVC inserted &CRRT initiated  4/12 PCCMre-consulted re-intubation 4/14 Extubated to BiPAP 4/15 Re-intubated due to respiratory distress 4/16 Tracheostomy 4/27 bronch with BAL 5/7 called back for hypercarbic failure. #6 cuffed placed. Right chest tube placed for enlarging  effusion. 5/15>> remains on vent, CVVHD  Consults:  TCTS PCCM HF Nephrology  Procedures:  R cephalic double lumen PICC LIJ tunneled HD catheter L brachial arterial line 6-0 cuffed shiley  Significant Diagnostic Tests:  3/30 R/ LHC >>  Ost LM to Mid LM lesion is 35% stenosed.  Prox LAD lesion is 90% stenosed.  Prox LAD to Mid LAD lesion is 50% stenosed.  Mid Cx lesion is 100% stenosed.  Prox RCA to Mid RCA lesion is 90% stenosed.  RPDA lesion is 95% stenosed.  LV end diastolic pressure is severely elevated.  Hemodynamic findings consistent with moderate pulmonary hypertension. 1. Severe 3 vessel obstructive CAD 2. High LV filling pressures 3. Reduced cardiac output with index 2.3 4. Moderate pulmonary HTN.  3/30TTE >> 1. Left ventricular ejection fraction, by estimation, is 30 to 35%. The left ventricle has moderately decreased function. The left ventricle demonstrates regional wall motion abnormalities.There is mild left ventricular hypertrophy. Left ventricular diastolic function could not be evaluated. There is severe hypokinesis of the left ventricular, entire inferior wall, inferoseptal wall, apical segment and lateral wall.  2. Right ventricular systolic function is hyperdynamic. The right ventricular size is normal. There is moderately elevated pulmonary artery systolic pressure.  3. Decreased posterior leaflet motion due to ischemic tethering of the mitral valve leaflets.  4. The mitral valve is abnormal. Moderate to severe mitral valve regurgitation.  5. The aortic valve is tricuspid. Aortic valve regurgitation is not visualized. Mild aortic valve sclerosis is present, with no evidence of aortic valve stenosis.  6. The inferior vena cava is normal in size with greater than 50% respiratory variability, suggesting right atrial pressure of 3 mmHg.   4/1 CT chest w/o >>Large bilateral pleural effusions with associated atelectasis.Moderate to severe  bilateral ground-glass opacities, likely reflecting pneumonia and/or edema.  4/1 RHC >> RA = 18 RV = 60/20  PA = 63/29 (42) PCW = 33 Fick cardiac output/index = 3.7/1.9 PVR = 2.5 WU FA sat = 98% PA sat = 47%, 49% PaPi = 2.2   Micro Data:  See Epic micro tab  Antimicrobials:    Interim history/subjective:  Remains on CRRT with NF ~ neg 50 ml/lhr Hgb 6.6, getting 1 unit PRBC Remains on NE 50mg/min and dobutamine 5 mcg/kgmin Weaned 12 hours yesterday on 14/5 and tolerated 15 mins of inline PMV with family at bedside prior to patient becoming tired.  Starting hydrotherapy for wounds today No complaints per patient  Objective   Blood pressure (!) 88/56, pulse 81, temperature 97.7 F (36.5 C), temperature source Oral, resp. rate (!) 25, height _0  (1.803 m), weight 65.1 kg, SpO2 100 %. CVP:  [1 mmHg-13 mmHg] 6 mmHg  Vent Mode: PRVC FiO2 (%):  [40 %] 40 % Set Rate:  [25 bmp] 25 bmp Vt Set:  [600 mL] 600 mL PEEP:  [5 cmH20] 5 cmH20 Pressure Support:  [14 cmH20] 14 cmH20 Plateau Pressure:  [21 cmH20-23 cmH20] 22 cmH20   Intake/Output Summary (Last 24 hours) at 06/17/2019 0831 Last data filed at 06/17/2019 0800 Gross per 24 hour  Intake 2407.95 ml  Output 2772 ml  Net -364.05 ml   Filed Weights   06/15/19 0500 06/16/19 0349 06/17/19 0500  Weight: 65.8 kg 64.3 kg 65.1 kg    Examination:   General:  Chronically ill, frail elderly male sitting upright in bed in NAD HEENT: MM pink/moist, pupils 3/reactive, R cortrack, midline cuffed trach Neuro: Awake, mouths some words, f/c, MAE- generalized weakness CV: rr, no murmur PULM:  Non labored, currently weaning on PSV 14/5- > taken down to 10/5 with good TV > 500, clear anteriorly, diminished in bases, minimal left basilar rales, minimal secretions, R lateral chest tube to waterseal, no airleak, no drainage in 48 hours GI: soft, bs active, no foley, rectal tube  Extremities: warm/dry, no LE edema  Skin: no rashes, multiple  areas of pressure dressings  CXR 6/1- stable lines/ tubes, ongoing persistent bilateral interstitial infiltrates/ edema   Resolved Problems   Agitated delirium- improved - seroquel qHS, continue melatonin - continue PRN oxycodone - re-orient, encourage day/night cycles  Hypoglycemia, resolved -Continue tube feeds, continue to monitor  Constipation/colonic ileus - sorbitol PRN - Continue senna and miralax  Assessment & Plan:   Acute on chronic hypoxemic and hypercarbic respiratory failure after CABG now s/p trach. Multifactorial etiology. Treated Enterobacter pneumonia s/p antibiotics. Ongoing persistent diffuse interstitial and airspace process in setting of CHF and severe protein calorie malnutrition manifested by resultant pulmonary edema and pleural effusions. - Full MV support with ongoing daily PSV trials- doing better today, can likely go to ATC this afternoon pending on how hydrotherapy goes (will need fentanyl for pain management)  - Chest tube per TCTS  - Intermittent CXR - Fluid removal per CRRT  - VAP bundle  - xopenex prn   Toxic metabolic encephalopathy - re-orient as able, lights on during day - Encourage family visits - Continue current Rx  Shock- likely multifactorial etiology in setting of acute systolic CHF 2/2 iCM (most recent 5/13 EF 35-40%) s/p CABG/MVR 4/6 and cardiogenic shock. Most likely also has distributive component in setting of protein calorie malnutrition resulting in low oncotic pressure -> significant third spacing.  -Continue dobutamine, florinef, levophed, midodrine  ESRD: oliguric/anuric AKI in setting of multifactorial shock as described above. CRRT  started on 4/10 and stopped 4/29 with failed attempt to transition to iHD on 5/1. Now back on CRRT on 5/7 2/2 acute worsening of oxygenation and hypotension. He currently remains anuric. -CRRT with pull per TCTS and nephrology discussion  Dysphagia- -Likely need PEG tube at some point depending  on goals of care  Profound muscular deconditioning -PT OT  Protein calorie malnutrition - TF at goal   Pancytopenia- stable, monitor  Normocytic anemia - 1 unit PRBC 6/1, trend CBC  Goals of care-continue current level of care, Palliative care following, to revisit 06/18/19 after his grandson graduates high school  Best practice:  Diet: Tube feeds Pain/Anxiety/Delirium protocol (if indicated): see above VAP protocol (if indicated):HOB 30 degrees  DVT prophylaxis: ASA 329m, SCDs GI prophylaxis: PPI Glucose control:SSI Mobility: BR Code Status: Full  Family Communication:per primary team Disposition:ICU pending CRRT and vent liberation   CCT:  30 mins  BKennieth Rad MSN, AGACNP-BC [facility] 06/17/2019, 8:31 AM  See Amion for personal pager PCCM on call pager ([phone]

## 2019-06-17 NOTE — Progress Notes (Addendum)
TCTS DAILY ICU PROGRESS NOTE                   [address]            [place] [address]          [phone]   46 Days Post-Op Procedure(s) (LRB): VIDEO BRONCHOSCOPY USING DISPOSABLE ANESTHESIA SCOPE (N/A) TRACHEOSTOMY (N/A)  Total Length of Stay:  LOS: 64 days   Subjective: Patient more awake this am. He mouthed he is cold.  Objective: Vital signs in last 24 hours: Temp:  [96.3 F (35.7 C)-98.6 F (37 C)] 98 F (36.7 C) (06/01 0400) Pulse Rate:  [76-86] 81 (06/01 0700) Cardiac Rhythm: Normal sinus rhythm (06/01 0400) Resp:  [16-28] 25 (06/01 0700) BP: (79-135)/(42-124) 88/56 (06/01 0700) SpO2:  [95 %-100 %] 100 % (06/01 0700) FiO2 (%):  [40 %] 40 % (06/01 0400) Weight:  [65.1 kg] 65.1 kg (06/01 0500)  Filed Weights   06/15/19 0500 06/16/19 0349 06/17/19 0500  Weight: 65.8 kg 64.3 kg 65.1 kg    Weight change: 0.8 kg   CVP:  [1 mmHg-13 mmHg] 6 mmHg  Intake/Output from previous day: 05/31 0701 - 06/01 0700 In: 2477 [I.V.:217; NG/GT:2260] Out: 2768   Intake/Output this shift: No intake/output data recorded.  Current Meds: Scheduled Meds: . aspirin EC  325 mg Oral Daily   Or  . aspirin  324 mg Per Tube Daily  . B-complex with vitamin C  1 tablet Per Tube Daily  . citalopram  10 mg Per Tube Daily  . collagenase   Topical Daily  . darbepoetin (ARANESP) injection - NON-DIALYSIS  150 mcg Subcutaneous Q Tue-1800  . feeding supplement (PRO-STAT SUGAR FREE 64)  30 mL Per Tube BID  . feeding supplement (VITAL 1.5 CAL)  1,000 mL Per Tube Q24H  . fentaNYL  1 patch Transdermal Q72H  . fludrocortisone  0.2 mg Oral Daily  . gabapentin  100 mg Oral QHS  . insulin aspart  0-6 Units Subcutaneous Q4H  . mouth rinse  15 mL Mouth Rinse Q2H  . melatonin  3 mg Per Tube QHS  . metoCLOPramide (REGLAN) injection  10 mg Intravenous Q6H  . midodrine  20 mg Per Tube Q8H  . nutrition supplement (JUVEN)  1 packet Per Tube BID  . pantoprazole sodium  40 mg Per  Tube BID  . QUEtiapine  25 mg Oral QHS  . rOPINIRole  0.5 mg Oral QHS  . sodium chloride flush  10-40 mL Intracatheter Q12H   Continuous Infusions: . sodium chloride 250 mL (06/10/19 1053)  . dextrose 5 % and 0.9% NaCl Stopped (06/06/19 0513)  . DOBUTamine 5 mcg/kg/min (06/17/19 0700)  . norepinephrine (LEVOPHED) Adult infusion 5 mcg/min (06/17/19 0700)  . prismasol BGK 2/2.5 dialysis solution 1,000 mL/hr at 06/17/19 0529  . prismasol BGK 2/2.5 replacement solution 500 mL/hr at 06/17/19 0328  . prismasol BGK 2/2.5 replacement solution 300 mL/hr at 06/16/19 1027   PRN Meds:.sodium chloride, alum & mag hydroxide-simeth, dextrose, diphenhydrAMINE, fentaNYL (SUBLIMAZE) injection, Gerhardt's butt cream, heparin, heparin, levalbuterol, ondansetron (ZOFRAN) IV, pneumococcal 23 valent vaccine, polyethylene glycol, sennosides, sodium chloride flush, sorbitol, traMADol  Heart: RRR Lungs:Diminished bibasilar breath sounds Abdomen: Soft, bowel sounds present Extremities: Boots in place Wound: Clean and dry.  Right chest tube: Little to no drainage of late  Lab Results: CBC: Recent Labs    06/15/19 0345 06/17/19 0428  WBC 5.5 7.9  HGB 7.0* 6.6*  HCT 23.5* 22.2*  PLT 131* 177   BMET:  Recent Labs    06/16/19 1548 06/17/19 0428  NA 133* 131*  K 5.1 4.6  CL 97* 99  CO2 28 25  GLUCOSE 175* 162*  BUN 39* 41*  CREATININE 1.18 1.16  CALCIUM 8.3* 8.3*    CMET: Lab Results  Component Value Date   WBC 7.9 06/17/2019   HGB 6.6 (LL) 06/17/2019   HCT 22.2 (L) 06/17/2019   PLT 177 06/17/2019   GLUCOSE 162 (H) 06/17/2019   CHOL 174 08/21/2017   TRIG 143 08/21/2017   HDL 57 08/21/2017   LDLCALC 88 08/21/2017   ALT 14 06/06/2019   AST 24 06/06/2019   NA 131 (L) 06/17/2019   K 4.6 06/17/2019   CL 99 06/17/2019   CREATININE 1.16 06/17/2019   BUN 41 (H) 06/17/2019   CO2 25 06/17/2019   TSH 0.925 03/20/2019   INR 1.6 (H) 05/01/2019   HGBA1C 9.0 (H) 03/23/2019   MICROALBUR 30  08/21/2017    PT/INR:  No results for input(s): LABPROT, INR in the last 72 hours. Radiology: [facility] Chest Port 1 View  Result Date: 06/17/2019 CLINICAL DATA:  Chest tube.  Tracheostomy tube. EXAM: PORTABLE CHEST 1 VIEW COMPARISON:  06/14/2019. FINDINGS: Tracheostomy tube, feeding tube, left subclavian dual lumen central line, right PICC line, and right chest tube in stable position. Prior CABG and cardiac valve replacement. Stable cardiomegaly. Diffuse bilateral interstitial infiltrates/edema again noted, slight improvement in aeration from prior exam. No pleural effusion or pneumothorax. Surgical clips noted over the right chest and upper abdomen. IMPRESSION: 1. Lines and tubes including right chest tube in stable position. No pneumothorax. 2.  Prior CABG and cardiac valve replacement.  Stable cardiomegaly. 3. Diffuse bilateral interstitial infiltrates/edema again noted, slight improvement in aeration from prior exam. Electronically Signed   By: [place]   On: 06/17/2019 06:06    Assessment/Plan: S/P Procedure(s) (LRB): VIDEO BRONCHOSCOPY USING DISPOSABLE ANESTHESIA SCOPE (N/A) TRACHEOSTOMY (N/A)  1. CV-S/p removal of Impella on 04/09.  S/p V tach arrest 04/15. Previous a fib. SR with HR in the 70-80's this am.  On  Midodrine 20 mg tid and Nor epinephrine 6 mcg/min, and Dobutamine drip 5 mcg/kg/min. Co ox this am decreased to 58.6 2. Pulmonary-S/p trach 04/16.  Right chest tube with no recorded ouptut last 24 hours. CXR this am shows no pneumothorax, cardiomegaly, se vere, diffuse bilateral airspace disease.  3. Expected post op blood loss anemia-H and H this am decreased to 6.6 and 22.2 Will transfuse and he has had transfusions previously. Continue Aranesp 4. DM-CBGs 174/178/157 . He was on Metformin 1000 mg bid prior to surgery, but will continue on Insulin for now as NPO, TFs. Pre op HGA1C 9.  5. Oliguric/anuric AKI-Creatinine decreased to 1.16 this am. He had CRRT yesterday. Nephrology  following and arranging for CRRT accordingly.  6. GI-severe malnutrition of chronic illness.  Cortrak, TFs at 60 ml/hr. Speech pathology placed post pyloric Cortrak recently as existing Cortak stopped functioning 7. Acute systolic heart failure-CVVHD helping with volume status 8. Extremely deconditioned-will need PT as able. Per family wishes, continue full support;grandson graduates tomorrow  60. Hyponatremia-sodium remains 131 10. Mild thrombocytopenia has resolved -platelets this am increased to 177,000  [name] PA-C 06/17/2019 7:42 AM    medical condition stable on considerable support of renal, pulmonary and hemodynamic systems Patient looking forward to sharing grandsons HS graduation    tomorrow on Face Time Mild generalized pain back and hips  Cont current care   patient examined and medical record reviewed,agree with above note. [name] 06/17/2019

## 2019-06-17 NOTE — Progress Notes (Signed)
   06/17/19 1602  Clinical Encounter Type  Visited With Patient and family together  Visit Type Initial;Spiritual support  Referral From Nurse;Palliative care team  Consult/Referral To Chaplain  Spiritual Encounters  Spiritual Needs Prayer  Stress Factors  Patient Stress Factors Health changes  Family Stress Factors Major life changes (Pt. grandson highschool graduation)  The chaplain responded to RN-Cybil referral for Pt. prayer.  The Pt. wife-[name] and daughter-Tabitha are bedside and communicated the Pt. prayer request to the chaplain.  The chaplain listened as the family shared the Pt. role in his family and especially with his grandson-Blayden.Sheilah Pigeon will graduate from high school Wednesday at [employer].  The chaplain understands the RN has reserved a unit I-Pad and will practice before hand how to connect to Face Time for the Pt. to watch his grandson graduate.  The Pt. and family accepted the chaplain's invitation for continued spiritual care.

## 2019-06-17 NOTE — Plan of Care (Signed)
  Problem: Clinical Measurements: Goal: Ability to maintain clinical measurements within normal limits will improve Outcome: Progressing   Problem: Nutrition: Goal: Adequate nutrition will be maintained Outcome: Progressing   

## 2019-06-18 LAB — CBC
HCT: 25.8 % — ABNORMAL LOW (ref 39.0–52.0)
Hemoglobin: 8 g/dL — ABNORMAL LOW (ref 13.0–17.0)
MCH: 28.8 pg (ref 26.0–34.0)
MCHC: 31 g/dL (ref 30.0–36.0)
MCV: 92.8 fL (ref 80.0–100.0)
Platelets: 218 10*3/uL (ref 150–400)
RBC: 2.78 MIL/uL — ABNORMAL LOW (ref 4.22–5.81)
RDW: 15.7 % — ABNORMAL HIGH (ref 11.5–15.5)
WBC: 9 10*3/uL (ref 4.0–10.5)
nRBC: 0 % (ref 0.0–0.2)

## 2019-06-18 LAB — CULTURE, BLOOD (ROUTINE X 2)
Culture: NO GROWTH
Culture: NO GROWTH
Special Requests: ADEQUATE
Special Requests: ADEQUATE

## 2019-06-18 LAB — RENAL FUNCTION PANEL
Albumin: 2 g/dL — ABNORMAL LOW (ref 3.5–5.0)
Albumin: 2.1 g/dL — ABNORMAL LOW (ref 3.5–5.0)
Anion gap: 9 (ref 5–15)
Anion gap: 9 (ref 5–15)
BUN: 41 mg/dL — ABNORMAL HIGH (ref 8–23)
BUN: 42 mg/dL — ABNORMAL HIGH (ref 8–23)
CO2: 27 mmol/L (ref 22–32)
CO2: 28 mmol/L (ref 22–32)
Calcium: 8.4 mg/dL — ABNORMAL LOW (ref 8.9–10.3)
Calcium: 8.1 mg/dL — ABNORMAL LOW (ref 8.9–10.3)
Chloride: 97 mmol/L — ABNORMAL LOW (ref 98–111)
Chloride: 96 mmol/L — ABNORMAL LOW (ref 98–111)
Creatinine, Ser: 1.07 mg/dL (ref 0.61–1.24)
Creatinine, Ser: 1.1 mg/dL (ref 0.61–1.24)
GFR calc Af Amer: >60 mL/min (ref >60)
GFR calc Af Amer: >60 mL/min (ref >60)
GFR calc non Af Amer: >60 mL/min (ref >60)
GFR calc non Af Amer: >60 mL/min (ref >60)
Glucose, Bld: 181 mg/dL — ABNORMAL HIGH (ref 70–99)
Glucose, Bld: 234 mg/dL — ABNORMAL HIGH (ref 70–99)
Phosphorus: 2 mg/dL — ABNORMAL LOW (ref 2.5–4.6)
Phosphorus: 4.5 mg/dL (ref 2.5–4.6)
Potassium: 4.2 mmol/L (ref 3.5–5.1)
Potassium: 3.9 mmol/L (ref 3.5–5.1)
Sodium: 133 mmol/L — ABNORMAL LOW (ref 135–145)
Sodium: 133 mmol/L — ABNORMAL LOW (ref 135–145)

## 2019-06-18 LAB — TYPE AND SCREEN
ABO/RH(D): O NEG
Antibody Screen: NEGATIVE
Unit division: 0

## 2019-06-18 LAB — COOXEMETRY PANEL
Carboxyhemoglobin: 1.6 % — ABNORMAL HIGH (ref 0.5–1.5)
Methemoglobin: 1 % (ref 0.0–1.5)
O2 Saturation: 66 %
Total hemoglobin: 8.4 g/dL — ABNORMAL LOW (ref 12.0–16.0)

## 2019-06-18 LAB — MAGNESIUM: Magnesium: 2.5 mg/dL — ABNORMAL HIGH (ref 1.7–2.4)

## 2019-06-18 LAB — BPAM RBC
Blood Product Expiration Date: 202106072359
ISSUE DATE / TIME: 202106010830
Unit Type and Rh: 9500

## 2019-06-18 LAB — GLUCOSE, CAPILLARY
Glucose-Capillary: 154 mg/dL — ABNORMAL HIGH (ref 70–99)
Glucose-Capillary: 175 mg/dL — ABNORMAL HIGH (ref 70–99)
Glucose-Capillary: 143 mg/dL — ABNORMAL HIGH (ref 70–99)
Glucose-Capillary: 193 mg/dL — ABNORMAL HIGH (ref 70–99)
Glucose-Capillary: 208 mg/dL — ABNORMAL HIGH (ref 70–99)
Glucose-Capillary: 190 mg/dL — ABNORMAL HIGH (ref 70–99)

## 2019-06-18 MED ORDER — COLLAGENASE 250 UNIT/GM EX OINT
TOPICAL_OINTMENT | Freq: Every day | CUTANEOUS | Status: DC
  Filled 2019-06-18: qty 30

## 2019-06-18 MED ORDER — SODIUM PHOSPHATES 45 MMOLE/15ML IV SOLN
30.0000 mmol | Freq: Once | INTRAVENOUS | Status: AC
  Administered 2019-06-18: 30 mmol via INTRAVENOUS
  Filled 2019-06-18: qty 10

## 2019-06-18 NOTE — Progress Notes (Signed)
Pt placed back on ventilator for night time support tolerating well

## 2019-06-18 NOTE — Progress Notes (Signed)
Spoke with CCM NP regarding trach change. It is okay to wait until the AM to change patient's trach per NP.

## 2019-06-18 NOTE — Progress Notes (Signed)
Chaplain was pastorally present with Pt. during bedside RN care.  F/U spiritual care is available as needed.

## 2019-06-18 NOTE — Progress Notes (Signed)
EVENING ROUNDS NOTE :     [address]       [place] [address]             [phone]                 47 Days Post-Op Procedure(s) (LRB): VIDEO BRONCHOSCOPY USING DISPOSABLE ANESTHESIA SCOPE (N/A) TRACHEOSTOMY (N/A)   Total Length of Stay:  LOS: 65 days  Events:   No events    BP (!) 86/58   Pulse 78   Temp 97.6 F (36.4 C) (Oral)   Resp 16   Ht 5\' 11"  (1.803 m) Comment: measured x 3  Wt 62.5 kg   SpO2 96%   BMI 19.22 kg/m   CVP:  [5 mmHg-23 mmHg] 10 mmHg  Vent Mode: CPAP;PSV FiO2 (%):  [40 %-60 %] 60 % Set Rate:  [25 bmp] 25 bmp Vt Set:  [600 mL] 600 mL PEEP:  [5 cmH20] 5 cmH20 Pressure Support:  [8 cmH20] 8 cmH20 Plateau Pressure:  [16 cmH20-22 cmH20] 21 cmH20  . sodium chloride 250 mL (06/10/19 1053)  . dextrose 5 % and 0.9% NaCl Stopped (06/06/19 0513)  . DOBUTamine 5 mcg/kg/min (06/18/19 1700)  . norepinephrine (LEVOPHED) Adult infusion 5 mcg/min (06/18/19 1700)  . prismasol BGK 2/2.5 dialysis solution 1,000 mL/hr at 06/18/19 1210  . prismasol BGK 2/2.5 replacement solution 500 mL/hr at 06/18/19 1058  . prismasol BGK 2/2.5 replacement solution 300 mL/hr at 06/17/19 2232    I/O last 3 completed shifts: In: 2907.4 [I.V.:297.4; Blood:350; NG/GT:2260] Out: 4263 [Other:4063; Stool:200]   CBC Latest Ref Rng & Units 06/18/2019 06/17/2019 06/15/2019  WBC 4.0 - 10.5 K/uL 9.0 7.9 5.5  Hemoglobin 13.0 - 17.0 g/dL 8.0(L) 6.6(LL) 7.0(L)  Hematocrit 39.0 - 52.0 % 25.8(L) 22.2(L) 23.5(L)  Platelets 150 - 400 K/uL 218 177 131(L)    BMP Latest Ref Rng & Units 06/18/2019 06/18/2019 06/17/2019  Glucose 70 - 99 mg/dL 234(H) 181(H) 144(H)  BUN 8 - 23 mg/dL 42(H) 41(H) 51(H)  Creatinine 0.61 - 1.24 mg/dL 1.10 1.07 1.07  Sodium 135 - 145 mmol/L 133(L) 133(L) 132(L)  Potassium 3.5 - 5.1 mmol/L 3.9 4.2 4.4  Chloride 98 - 111 mmol/L 96(L) 97(L) 95(L)  CO2 22 - 32 mmol/L 28 27 29   Calcium 8.9 - 10.3 mg/dL 8.1(L) 8.4(L) 8.5(L)    ABG    Component Value  Date/Time   PHART 7.408 06/13/2019 1134   PCO2ART 48.4 (H) 06/13/2019 1134   PO2ART 96 06/13/2019 1134   HCO3 30.7 (H) 06/13/2019 1134   TCO2 32 06/13/2019 1134   ACIDBASEDEF 3.0 (H) 04/28/2019 0735   O2SAT 66.0 06/18/2019 0334       Melodie Bouillon, MD 06/18/2019 5:35 PM

## 2019-06-18 NOTE — Progress Notes (Signed)
NAME:  Brendan [name], MRN:  [id number], DOB:  [birth year], LOS: 54 ADMISSION DATE:  03/28/2019, CONSULTATION DATE:  04/17/19 REFERRING MD:  Dr. Haroldine Laws, CHIEF COMPLAINT:  Respiratory distress   Brief History   56 yoM originally presented with SOB and fatigue at [facility] found to have new HFrEF, NSTEMI, and bilateral pleural effusions transferred to [facility] on 3/29 for further cardiac evaluation. 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 Started on lasix and milrinone gtts however developed NSVT. Was taken 4/1 for placement of IABP and swan for optimization prior to CABG. Was on precedex for agitation/ confusion, some concern for DTs. On the evening of 4/1, patient developed worsening respiratory distress and hypoxia, PCCM consulted for intubation and vent management.  Patient was extubated on 4/14 and developed worsening hypoxia on BiPAP. Overnight he developed pulseless VT requiring 1 minute of CPR and epinephrine to achieve ROSC. Neurovascularly intact afterward. Subsequently had increased work of breathing and worsening respiratory distress requiring intubation. Patient had tracheostomy performed on 05/01/2019. Trying to wean off ventilator.   Past Medical History  Tobacco abuse, HTN, poorly controlled diabetes, diabetic neuropathy  Significant Hospital Events   3/30 Lt heart cath/TEE performed 4/1 Intubated, RHC/IABP/PA cath 4/2 Impella placement 4/3 febrile over evening and night. Blood cultures obtained. Vanc/cefepime started. Hematuria--heparin held. 4/4 Extubated,abx transitioned to rocephin. Heparin restarted 4/6 Re-intubated for CABG/MVR 4/7 Extubated from CABG to BiPAP 4/9 Impella removed  4/9 TEE 4/10 CVC inserted &CRRT initiated  4/12 PCCMre-consulted re-intubation 4/14 Extubated to BiPAP 4/15 Re-intubated due to respiratory distress 4/16 Tracheostomy 4/27 bronch with BAL 5/7 called back for hypercarbic failure. #6 cuffed placed. Right chest tube placed for enlarging  effusion. 5/15>> remains on vent, CVVHD  Consults:  TCTS PCCM HF Nephrology  Procedures:  R cephalic double lumen PICC LIJ tunneled HD catheter L brachial arterial line 6-0 cuffed shiley  Significant Diagnostic Tests:  3/30 R/ LHC >>  Ost LM to Mid LM lesion is 35% stenosed.  Prox LAD lesion is 90% stenosed.  Prox LAD to Mid LAD lesion is 50% stenosed.  Mid Cx lesion is 100% stenosed.  Prox RCA to Mid RCA lesion is 90% stenosed.  RPDA lesion is 95% stenosed.  LV end diastolic pressure is severely elevated.  Hemodynamic findings consistent with moderate pulmonary hypertension. 1. Severe 3 vessel obstructive CAD 2. High LV filling pressures 3. Reduced cardiac output with index 2.3 4. Moderate pulmonary HTN.  3/30TTE >> 1. Left ventricular ejection fraction, by estimation, is 30 to 35%. The left ventricle has moderately decreased function. The left ventricle demonstrates regional wall motion abnormalities.There is mild left ventricular hypertrophy. Left ventricular diastolic function could not be evaluated. There is severe hypokinesis of the left ventricular, entire inferior wall, inferoseptal wall, apical segment and lateral wall.  2. Right ventricular systolic function is hyperdynamic. The right ventricular size is normal. There is moderately elevated pulmonary artery systolic pressure.  3. Decreased posterior leaflet motion due to ischemic tethering of the mitral valve leaflets.  4. The mitral valve is abnormal. Moderate to severe mitral valve regurgitation.  5. The aortic valve is tricuspid. Aortic valve regurgitation is not visualized. Mild aortic valve sclerosis is present, with no evidence of aortic valve stenosis.  6. The inferior vena cava is normal in size with greater than 50% respiratory variability, suggesting right atrial pressure of 3 mmHg.   4/1 CT chest w/o >>Large bilateral pleural effusions with associated atelectasis.Moderate to severe  bilateral ground-glass opacities, likely reflecting pneumonia and/or edema.  4/1 RHC >> RA = 18 RV = 60/20  PA = 63/29 (42) PCW = 33 Fick cardiac output/index = 3.7/1.9 PVR = 2.5 WU FA sat = 98% PA sat = 47%, 49% PaPi = 2.2   Micro Data:  See Epic micro tab  Antimicrobials:    Interim history/subjective:  afebrile Weaned on PSV 10/5 for 12 hrs yesterday  Remains on CRRT  coox 66 Doing well this morning on 8/5 Remains on NE 5 mcg/min and dobutamine 5 mcg/kg/min  Objective   Blood pressure 95/61, pulse 82, temperature 98.4 F (36.9 C), temperature source Oral, resp. rate (!) 22, height _0  (1.803 m), weight 62.5 kg, SpO2 100 %. CVP:  [5 mmHg-23 mmHg] 5 mmHg  Vent Mode: PRVC FiO2 (%):  [40 %] 40 % Set Rate:  [25 bmp] 25 bmp Vt Set:  [600 mL] 600 mL PEEP:  [5 cmH20] 5 cmH20 Pressure Support:  [10 cmH20] 10 cmH20 Plateau Pressure:  [16 cmH20-22 cmH20] 21 cmH20   Intake/Output Summary (Last 24 hours) at 06/18/2019 0843 Last data filed at 06/18/2019 0800 Gross per 24 hour  Intake 1977.13 ml  Output 3197 ml  Net -1219.87 ml   Filed Weights   06/16/19 0349 06/17/19 0500 06/18/19 0452  Weight: 64.3 kg 65.1 kg 62.5 kg    Examination:   General:  Chronically ill, frail elderly male sitting upright in bed in NAD HEENT: MM pink/moist, midline cuffed trach, right nare cortrak Neuro: Awake, follows simple commands, MAE-severely weak CV: rr PULM:  Non labored, getting good TV on 8/5 ~400's, anteriorly clear, faint left basilar rales, diminished in right base, right CT remains to waterseal, no output GI: soft, bs active, rectal tube, no foley Extremities: warm/dry, no LE edema  Skin: no rashes, multiple pressure dressings to lower extremities    Resolved Problems   Agitated delirium- improved - seroquel qHS, continue melatonin - continue PRN oxycodone - re-orient, encourage day/night cycles  Hypoglycemia, resolved -Continue tube feeds, continue to  monitor  Constipation/colonic ileus - sorbitol PRN - Continue senna and miralax  Assessment & Plan:   Acute on chronic hypoxemic and hypercarbic respiratory failure after CABG now s/p trach. Multifactorial etiology. Treated Enterobacter pneumonia 05/22/2019 s/p antibiotics. Ongoing persistent diffuse interstitial and airspace process in setting of CHF and severe protein calorie malnutrition manifested by resultant pulmonary edema and pleural effusions. P:  - doing well today on PSV, goal will be to ATC this afternoon after his hydrotherapy - right chest tube per TCTS, no output, ?d/c - Intermittent CXR - Fluid removal per CRRT  - VAP bundle  - xopenex prn   Toxic metabolic encephalopathy - re-orient as able, lights on during day - delirium precautions, encourage family visits  Shock- likely multifactorial etiology in setting of acute systolic CHF 2/2 iCM (most recent 5/13 EF 35-40%) s/p CABG/MVR 4/6 and cardiogenic shock. Most likely also has distributive component in setting of protein calorie malnutrition resulting in low oncotic pressure -> significant third spacing.  -Continue dobutamine, florinef, levophed, midodrine  ESRD: oliguric/anuric AKI in setting of multifactorial shock as described above. CRRT started on 4/10 and stopped 4/29 with failed attempt to transition to iHD on 5/1. Now back on CRRT on 5/7 2/2 acute worsening of oxygenation and hypotension. He currently remains anuric. -CRRT with pull per TCTS and nephrology discussion  Dysphagia- -Likely need PEG tube at some point depending on goals of care  Profound muscular deconditioning -PT OT  Protein calorie malnutrition - TF at goal  Pancytopenia- stable, monitor  Normocytic anemia - 1 unit PRBC 6/1,  - H/H stable today - trend CBC  Goals of care-continue current level of care, Palliative care following  Best practice:  Diet: Tube feeds Pain/Anxiety/Delirium protocol (if indicated): see above VAP protocol  (if indicated):HOB 30 degrees  DVT prophylaxis: ASA 359m, SCDs GI prophylaxis: PPI Glucose control:SSI Mobility: BR Code Status: Full  Family Communication:per primary team Disposition:ICU    CCT:  30 mins  BKennieth Rad MSN, AGACNP-BC [facility] 06/18/2019, 8:43 AM  See AShea Evansfor personal pager PCCM on call pager [phone]

## 2019-06-18 NOTE — Plan of Care (Signed)
°  Problem: Clinical Measurements: Goal: Respiratory complications will improve Outcome: Progressing Note: Tolerating trach collar this afternoon.   Problem: Nutrition: Goal: Adequate nutrition will be maintained Outcome: Progressing   Problem: Pain Managment: Goal: General experience of comfort will improve Outcome: Progressing

## 2019-06-18 NOTE — Progress Notes (Signed)
[facility] NEPHROLOGY PROGRESS NOTE  Assessment/ Plan: Pt is a 64 y.o. yo male admitted to [facility] 3/26 with SOB, new low EF and NSTEMI txd to [facility] 3/29: LHC 3 vessel CAD s/p CABG and MVR complicated by cardiogenic shock, IABP, Impella, and worsening volume status/respiratory status and started on CRRT 04/26/19.Admission Scr was 0.99.  #Anuric AKI: CRRT started on 4/10.  Tried intermittent hemodialysis multiple times unfortunately he did not tolerate because of worsening hypotension.  The CRRT was resumed on 5/26 with inotropes and pressor support.  Currently on 2K bath with acceptable potassium level.  Continue to monitor lab.    #Acute hypoxic respiratory failure: On vent, tracheostomy and has right chest tube.  Pulmonary is following.  Volume status acceptable.  #Cardiogenic shock/acute systolic CHF: Impella placed on 4/2 and removed on 4/9.  Status post CABG/MV repair currently on dobutamine, Levophed.    #Anemia due to acute blood loss and critical illness: Monitor hemoglobin.  Transfuse as needed.  #Hyponatremia: Managed with CRRT.  #Hypophosphatemia: Repleted phosphate.  Monitor lab.  #Severe debility/malnutrition.  PT evaluation  #Disposition: He is very deconditioned.  Blood pressure very low despite of 2 pressors.  He did not tolerate intermittent hemodialysis before and now on CRRT.  His grandson is graduating from school today and plan to have video conference with him today evening.  I will continue CRRT for now and I recommend goals of care discussion. At the moment, he is not a candidate for outpatient dialysis.  Subjective: Seen and examined in ICU.  Remains on vent.  Also on dobutamine and Levophed.  Systolic blood pressure around 80s.  No new event. Objective Vital signs in last 24 hours: Vitals:   06/18/19 0700 06/18/19 0730 06/18/19 0754 06/18/19 0800  BP: 104/61 94/67  95/61  Pulse: 83 84  82  Resp: (!) 25 20  (!) 22  Temp:   98.4 F (36.9 C)    TempSrc:   Oral   SpO2: 100% 100%  100%  Weight:      Height:       Weight change: -2.6 kg  Intake/Output Summary (Last 24 hours) at 06/18/2019 0817 Last data filed at 06/18/2019 0800 Gross per 24 hour  Intake 1977.13 ml  Output 3197 ml  Net -1219.87 ml       Labs: Basic Metabolic Panel: Recent Labs  Lab 06/17/19 0428 06/17/19 1554 06/18/19 0334  NA 131* 132* 133*  K 4.6 4.4 4.2  CL 99 95* 97*  CO2 25 29 27   GLUCOSE 162* 144* 181*  BUN 41* 51* 41*  CREATININE 1.16 1.07 1.07  CALCIUM 8.3* 8.5* 8.4*  PHOS 2.4* 2.7 2.0*   Liver Function Tests: Recent Labs  Lab 06/17/19 0428 06/17/19 1554 06/18/19 0334  ALBUMIN 2.0* 2.1* 2.0*   No results for input(s): LIPASE, AMYLASE in the last 168 hours. No results for input(s): AMMONIA in the last 168 hours. CBC: Recent Labs  Lab 06/12/19 0324 06/12/19 0324 06/13/19 0341 06/13/19 1134 06/14/19 0407 06/15/19 0345 06/17/19 0428  WBC 3.6*   < > 5.4  --  5.5 5.5 7.9  HGB 7.8*   < > 7.8*   < > 7.1* 7.0* 6.6*  HCT 25.4*   < > 25.6*   < > 23.8* 23.5* 22.2*  MCV 95.5  --  97.7  --  97.5 96.7 97.4  PLT 111*   < > 129*  --  128* 131* 177   < > = values in this interval  not displayed.   Cardiac Enzymes: No results for input(s): CKTOTAL, CKMB, CKMBINDEX, TROPONINI in the last 168 hours. CBG: Recent Labs  Lab 06/17/19 1535 06/17/19 2018 06/18/19 0016 06/18/19 0419 06/18/19 0750  GLUCAP 138* 166* 175* 154* 143*    Iron Studies: No results for input(s): IRON, TIBC, TRANSFERRIN, FERRITIN in the last 72 hours. Studies/Results: DG Chest Port 1 View  Result Date: 06/17/2019 CLINICAL DATA:  Chest tube.  Tracheostomy tube. EXAM: PORTABLE CHEST 1 VIEW COMPARISON:  06/14/2019. FINDINGS: Tracheostomy tube, feeding tube, left subclavian dual lumen central line, right PICC line, and right chest tube in stable position. Prior CABG and cardiac valve replacement. Stable cardiomegaly. Diffuse bilateral interstitial infiltrates/edema  again noted, slight improvement in aeration from prior exam. No pleural effusion or pneumothorax. Surgical clips noted over the right chest and upper abdomen. IMPRESSION: 1. Lines and tubes including right chest tube in stable position. No pneumothorax. 2.  Prior CABG and cardiac valve replacement.  Stable cardiomegaly. 3. Diffuse bilateral interstitial infiltrates/edema again noted, slight improvement in aeration from prior exam. Electronically Signed   By: [place]   On: 06/17/2019 06:06    Medications: Infusions: . sodium chloride 250 mL (06/10/19 1053)  . dextrose 5 % and 0.9% NaCl Stopped (06/06/19 0513)  . DOBUTamine 5 mcg/kg/min (06/18/19 0800)  . norepinephrine (LEVOPHED) Adult infusion 5 mcg/min (06/18/19 0800)  . prismasol BGK 2/2.5 dialysis solution 1,000 mL/hr at 06/18/19 0454  . prismasol BGK 2/2.5 replacement solution 500 mL/hr at 06/17/19 1805  . prismasol BGK 2/2.5 replacement solution 300 mL/hr at 06/17/19 2232  . sodium phosphate  Dextrose 5% IVPB      Scheduled Medications: . aspirin EC  325 mg Oral Daily   Or  . aspirin  324 mg Per Tube Daily  . B-complex with vitamin C  1 tablet Per Tube Daily  . citalopram  10 mg Per Tube Daily  . collagenase   Topical Daily  . darbepoetin (ARANESP) injection - NON-DIALYSIS  150 mcg Subcutaneous Q Tue-1800  . feeding supplement (PRO-STAT SUGAR FREE 64)  30 mL Per Tube BID  . feeding supplement (VITAL 1.5 CAL)  1,000 mL Per Tube Q24H  . fentaNYL  1 patch Transdermal Q72H  . fludrocortisone  0.2 mg Oral Daily  . gabapentin  100 mg Oral QHS  . insulin aspart  0-6 Units Subcutaneous Q4H  . mouth rinse  15 mL Mouth Rinse Q2H  . melatonin  3 mg Per Tube QHS  . metoCLOPramide (REGLAN) injection  10 mg Intravenous Q6H  . midodrine  20 mg Per Tube Q8H  . nutrition supplement (JUVEN)  1 packet Per Tube BID  . pantoprazole sodium  40 mg Per Tube BID  . QUEtiapine  25 mg Oral QHS  . rOPINIRole  0.5 mg Oral QHS  . sodium chloride  flush  10-40 mL Intracatheter Q12H    have reviewed scheduled and prn medications.  Physical Exam: General: Critically ill looking male lying on bed, alert awake and following simple commands Neck: Tracheostomy and on vent. Heart:RRR, s1s2 nl Lungs: Coarse breath sound, had a chest tube Abdomen:soft, Non-tender, non-distended Extremities:No edema Dialysis Access: Left IJ TDC placed by IR on 5/3/201.  Alaa Eyerman Prasad Kaileena Obi 06/18/2019,8:17 AM  LOS: 65 days  Pager: [phone]

## 2019-06-18 NOTE — Progress Notes (Addendum)
TCTS DAILY ICU PROGRESS NOTE                   [address]            [place] [address]          [phone]   47 Days Post-Op Procedure(s) (LRB): VIDEO BRONCHOSCOPY USING DISPOSABLE ANESTHESIA SCOPE (N/A) TRACHEOSTOMY (N/A)  Total Length of Stay:  LOS: 65 days   Subjective: Patient on CVVHD this am. He was sleeping and awakened. He smiled when I spoke of grandson graduating high school today.  Objective: Vital signs in last 24 hours: Temp:  [96 F (35.6 C)-98.5 F (36.9 C)] 98.5 F (36.9 C) (06/02 0400) Pulse Rate:  [70-87] 83 (06/02 0700) Cardiac Rhythm: Normal sinus rhythm (06/02 0400) Resp:  [6-39] 25 (06/02 0700) BP: (69-116)/(46-83) 104/61 (06/02 0700) SpO2:  [78 %-100 %] 100 % (06/02 0700) FiO2 (%):  [40 %] 40 % (06/02 0400) Weight:  [62.5 kg] 62.5 kg (06/02 0452)  Filed Weights   06/16/19 0349 06/17/19 0500 06/18/19 0452  Weight: 64.3 kg 65.1 kg 62.5 kg    Weight change: -2.6 kg   CVP:  [5 mmHg-23 mmHg] 8 mmHg  Intake/Output from previous day: 06/01 0701 - 06/02 0700 In: 1979 [I.V.:189; Blood:350; NG/GT:1440] Out: 0174 [Stool:200]  Intake/Output this shift: No intake/output data recorded.  Current Meds: Scheduled Meds: . aspirin EC  325 mg Oral Daily   Or  . aspirin  324 mg Per Tube Daily  . B-complex with vitamin C  1 tablet Per Tube Daily  . citalopram  10 mg Per Tube Daily  . collagenase   Topical Daily  . darbepoetin (ARANESP) injection - NON-DIALYSIS  150 mcg Subcutaneous Q Tue-1800  . feeding supplement (PRO-STAT SUGAR FREE 64)  30 mL Per Tube BID  . feeding supplement (VITAL 1.5 CAL)  1,000 mL Per Tube Q24H  . fentaNYL  1 patch Transdermal Q72H  . fludrocortisone  0.2 mg Oral Daily  . gabapentin  100 mg Oral QHS  . insulin aspart  0-6 Units Subcutaneous Q4H  . mouth rinse  15 mL Mouth Rinse Q2H  . melatonin  3 mg Per Tube QHS  . metoCLOPramide (REGLAN) injection  10 mg Intravenous Q6H  . midodrine  20 mg Per Tube Q8H    . nutrition supplement (JUVEN)  1 packet Per Tube BID  . pantoprazole sodium  40 mg Per Tube BID  . QUEtiapine  25 mg Oral QHS  . rOPINIRole  0.5 mg Oral QHS  . sodium chloride flush  10-40 mL Intracatheter Q12H   Continuous Infusions: . sodium chloride 250 mL (06/10/19 1053)  . dextrose 5 % and 0.9% NaCl Stopped (06/06/19 0513)  . DOBUTamine 5 mcg/kg/min (06/18/19 0700)  . norepinephrine (LEVOPHED) Adult infusion 3 mcg/min (06/18/19 0700)  . prismasol BGK 2/2.5 dialysis solution 1,000 mL/hr at 06/18/19 0454  . prismasol BGK 2/2.5 replacement solution 500 mL/hr at 06/17/19 1805  . prismasol BGK 2/2.5 replacement solution 300 mL/hr at 06/17/19 2232  . sodium phosphate  Dextrose 5% IVPB     PRN Meds:.sodium chloride, alum & mag hydroxide-simeth, dextrose, diphenhydrAMINE, fentaNYL (SUBLIMAZE) injection, Gerhardt's butt cream, heparin, heparin, levalbuterol, ondansetron (ZOFRAN) IV, pneumococcal 23 valent vaccine, polyethylene glycol, sennosides, sodium chloride flush, sorbitol, traMADol  Heart: RRR Lungs:Coarse breath sounds Abdomen: Soft, bowel sounds present Extremities: Boots in place Wound: Clean and dry.  Right chest tube: Little to no drainage of late  Lab Results:  CBC: Recent Labs    06/17/19 0428  WBC 7.9  HGB 6.6*  HCT 22.2*  PLT 177   BMET:  Recent Labs    06/17/19 1554 06/18/19 0334  NA 132* 133*  K 4.4 4.2  CL 95* 97*  CO2 29 27  GLUCOSE 144* 181*  BUN 51* 41*  CREATININE 1.07 1.07  CALCIUM 8.5* 8.4*    CMET: Lab Results  Component Value Date   WBC 7.9 06/17/2019   HGB 6.6 (LL) 06/17/2019   HCT 22.2 (L) 06/17/2019   PLT 177 06/17/2019   GLUCOSE 181 (H) 06/18/2019   CHOL 174 08/21/2017   TRIG 143 08/21/2017   HDL 57 08/21/2017   LDLCALC 88 08/21/2017   ALT 14 06/06/2019   AST 24 06/06/2019   NA 133 (L) 06/18/2019   K 4.2 06/18/2019   CL 97 (L) 06/18/2019   CREATININE 1.07 06/18/2019   BUN 41 (H) 06/18/2019   CO2 27 06/18/2019   TSH 0.925  04/03/2019   INR 1.6 (H) 05/01/2019   HGBA1C 9.0 (H) 03/26/2019   MICROALBUR 30 08/21/2017    PT/INR:  No results for input(s): LABPROT, INR in the last 72 hours. Radiology: No results found.  Assessment/Plan: S/P Procedure(s) (LRB): VIDEO BRONCHOSCOPY USING DISPOSABLE ANESTHESIA SCOPE (N/A) TRACHEOSTOMY (N/A)  1. CV-S/p removal of Impella on 04/09.  S/p V tach arrest 04/15. Previous a fib. SR with HR in the 70-80's this am.  On  Midodrine 20 mg tid and Nor epinephrine 3 mcg/min, and Dobutamine drip 5 mcg/kg/min. Co ox this am decreased to 66 2. Pulmonary-S/p trach 04/16.  Right chest tube with no recorded ouptut last 24 hours.  vere, diffuse bilateral airspace disease.  3. Expected post op blood loss anemia-Await this am's H and H. Had transfusion yesterday. Continue Aranesp 4. DM-CBGs 166/175/154 . He was on Metformin 1000 mg bid prior to surgery, but will continue on Insulin for now as NPO, TFs. Pre op HGA1C 9.  5. Oliguric/anuric AKI-Creatinine decreased to 1.07 this am. He had CRRT yesterday. Nephrology following and arranging for CVVHD accordingly.  6. GI-severe malnutrition of chronic illness.  Cortrak, TFs at 60 ml/hr. Speech pathology placed post pyloric Cortrak recently as existing Cortak stopped functioning 7. Acute systolic heart failure-CVVHD helping with volume status 8. Extremely deconditioned-will need PT as able. Per family wishes, continue full support;grandson graduates tomorrow  9. Hyponatremia-sodium up to [place] PA-C 06/18/2019 7:31 AM     Very important day for this patient- family graduation he will see on Face Time. He is engaged and happy today Cont current care  patient examined and medical record reviewed,agree with above note. [name] 06/18/2019

## 2019-06-18 NOTE — Consult Note (Signed)
[facility] Nurse Consult Note: Patient receiving care in [place]. Spouse and daughter in room. Reason for Consult: new DTIs Wound type: see narrative below Pressure Injury POA: No Per primary RN Cybill over the phone.  She relayed that the wounds on the feet fit the placement and dimensions of SCD devices and use of prevalon heel lift boot straps. The right foot has the following MDRPIs: Dorsum: 0.2 cm x 0.3 cm x no depth. Wound bed is pink. This area needs protection by a foam dressing; order for has been entered. Right lateral foot: close to the 5th MH area wound measures 1.5 cm x 3 cm; is brown and dry.  Right lateral hindfoot region has a darkened area that measures 1.8 cm x 0.9 cm.  For these two areas I have ordered Xeroform gauze and foam dressings. The right posterior heel has two very small brown spots that need a foam dressing only. The left heel has a brown, dry wound that measures 2 cm x 1.2 cm. The left lateral hindfoot has a dark pink wound that measures 0.5 cm x 0.5 cm.  I have ordered Xeroform and foam for these two wounds. The dorsum of the left foot has an unstageable PI that has some eschar, that measures 2.5 cm x 1 cm.  For this wound I have ordered santyl. Val Riles, RN, MSN, CWOCN, CNS-BC, pager [phone]

## 2019-06-18 NOTE — Progress Notes (Signed)
This chaplain understands from Pt. and RN all plans are made for the Pt. to virtually attend grandson's highschool graduation. F/U spiritual care is available as needed.

## 2019-06-18 NOTE — Progress Notes (Signed)
  Speech Language Pathology Treatment: Nada Boozer Speaking valve  Patient Details Name: Brendan [name] MRN: [id number] DOB: 1955/10/04 Today's Date: 06/18/2019 Time: 8563-1497 SLP Time Calculation (min) (ACUTE ONLY): 19 min  Assessment / Plan / Recommendation Clinical Impression  Pt seen with daughter and wife at bedside. Pt had been weaning on vent all am, RT just put pt on ATC with stable vitals. Pt indicated he would like to try PMSV to communicate with his family. RT and SLP worked together to ensure maximal secretion clearance prior to and after cuff deflation. RT increased FiO2 in preparation for additional pt effort to clear. Mild to moderate tracheal and oral secretions cleared after less than a minute of coughing with eventual recovery of O2 sats to prior baseline. PMSV placed with immediate effortless phonation. Pt and family had a brief conversation without additional SLP intervention needed as pt was independent. After about 7 minutes the pt indicated he was tired and ready to take his valve off. There was no change in his tolerance or vitals at that point. Suspect that pt was generally fatigued by social communication and also accustomed to short trials with inline valve. Discussed there difference of PMSV on ATC with pt, family and RN. As long as pt is on ATC with cuff deflated, he can use PMSV with full SLP, RN, RT supervision to communicate wants/needs.   HPI HPI: 39 yom originally presented with SOB and fatigue at [facility] found to have new HFrEF,  NSTEMI, and bilateral pleural effusions; transferred to [facility] on 3/29 for further cardiac evaluation.  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  Started on lasix and milrinone gtts however developed NSVT.  Was taken 4/1 for placement of IABP and swan for optimization prior to CABG.  On the evening of 4/1, developed worsening respiratory distress and hypoxia. ETT 4/1-4/4, reintubated 4/6 for CABG/MVR; extubated 4/7.  Impella placed 4/2, was removed 4/11.  Reintubated 4/12-4/14, extubated to BiPAP. Found pulseless 4/14 VT requiring 1 minute of CPR and epinephrine to achieve ROSC. Reintubated due to resp distress 4/15;  trach 4/16. Dx include acute on chronic hypoxemic and hypercarbic resp failure after CABG - pulmonary edema and pleural effusions, difficult wean from vent; metabolic encephalopathy; ESRD -CRRT 4/10-4/29, back on CRRT 5/7; profound deconditioning and severe protein calorie malnutrition. Palliative medicine is following.         SLP Plan  Continue with current plan of care       Recommendations         Patient may use Passy-Muir Speech Valve: Intermittently with supervision PMSV Supervision: Full         Oral Care Recommendations: Oral care QID Follow up Recommendations: LTACH SLP Visit Diagnosis: Aphonia (R49.1) Plan: Continue with current plan of care       GO               Herbie Baltimore, MA [place] Pager [phone] Office [phone]  Lynann Beaver 06/18/2019, 1:36 PM

## 2019-06-18 NOTE — Progress Notes (Signed)
Physical Therapy Wound Treatment °Patient Details  °Name: Brendan [name] °MRN: [id number] °Date of Birth: 01/10/1956 ° °Today's Date: 06/18/2019 °Time: 0858-0927 °Time Calculation (min): 29 min ° °Subjective  °Patient and Family Stated Goals: to watch grandson graduation via FaceTime tomorrow °Prior Treatments: dressing changes   °Pain Score: 6/10 pain prior to treatment session, pt reports no increase in pain with hydrotherapy ° °Wound Assessment  °Pressure Injury 05/30/19 Coccyx Medial;Mid Stage 3 -  Full thickness tissue loss. Subcutaneous fat may be visible but bone, tendon or muscle are NOT exposed. Previously deep tissue wound from 05/13/19 (Active)  °Wound Image   06/17/19 1000  °Dressing Type Liquid skin adhesive;Moist to moist;ABD;Other (Comment) 06/18/19 0900  °Dressing Clean;Dry;Intact 06/18/19 0900  °Dressing Change Frequency Daily 06/18/19 0900  °State of Healing Non-healing 06/18/19 0900  °Site / Wound Assessment Pink;Red;Yellow;Clean;Pale 06/18/19 0900  °% Wound base Red or Granulating 0% 06/18/19 0900  °% Wound base Yellow/Fibrinous Exudate 95% 06/18/19 0900  °% Wound base Black/Eschar 5% 06/18/19 0900  °Peri-wound Assessment Intact;Erythema (blanchable);Maceration 06/18/19 0900  °Wound Length (cm) 4.7 cm 06/17/19 1000  °Wound Width (cm) 3.8 cm 06/17/19 1000  °Wound Depth (cm) 0 cm 06/11/19 2300  °Wound Surface Area (cm^2) 17.86 cm^2 06/17/19 1000  °Wound Volume (cm^3) 0 cm^3 06/11/19 2300  °Margins Unattached edges (unapproximated) 06/18/19 0900  °Drainage Amount None 06/18/19 0900  °Drainage Description No odor;Serosanguineous 06/18/19 0900  °Treatment Debridement (Selective);Hydrotherapy (Pulse lavage);Packing (Saline gauze) 06/18/19 0900  ° Santyl applied to necrotic tissue  ° °Hydrotherapy °Pulsed lavage therapy - wound location: sacrum °Pulsed Lavage with Suction (psi): 12 psi(4-12 psi) °Pulsed Lavage with Suction - Normal Saline Used: 1000 mL °Pulsed Lavage Tip: Tip with splash shield °Selective  Debridement °Selective Debridement - Location: sacrum °Selective Debridement - Tools Used: Scalpel;Forceps(utilized scalpel to score eschar ) °Selective Debridement - Tissue Removed: softened gray/white eschar  ° °Wound Assessment and Plan  °Wound Therapy - Assess/Plan/Recommendations °Wound Therapy - Clinical Statement: Eschar is softening and pulling away from wound bed. Periwound is somewhat mascerated so changed dressing from foam, to ABD pad with medipore tape to improve microclimate to improve wound healing. Pt will continue to benefit from hydrotherapy for eventual removal of eschar, decreasing bioburden and improving healing.  °Wound Therapy - Functional Problem List: decreased mobility, increased kidney and lung supportive machinery limiting mobility,   °Factors Delaying/Impairing Wound Healing: Immobility;Multiple medical problems;Other (comment)(decreased nutrition) °Hydrotherapy Plan: Debridement;Patient/family education;Pulsatile lavage with suction;Dressing change °Wound Therapy - Frequency: 6X / week °Wound Therapy - Follow Up Recommendations: Skilled nursing facility °Wound Plan: see above ° °Wound Therapy Goals- °Improve the function of patient's integumentary system by progressing the wound(s) through the phases of wound healing (inflammation - proliferation - remodeling) by: °Decrease Necrotic Tissue to: 75 °Decrease Necrotic Tissue - Progress: Progressing toward goal °Increase Granulation Tissue to: 20 °Increase Granulation Tissue - Progress: Progressing toward goal °Decrease Length/Width/Depth by (cm): 0.5 °Decrease Length/Width/Depth - Progress: Progressing toward goal °Goals/treatment plan/discharge plan were made with and agreed upon by patient/family: Yes °Time For Goal Achievement: 7 days °Wound Therapy - Potential for Goals: Good ° °Goals will be updated until maximal potential achieved or discharge criteria met.  Discharge criteria: when goals achieved, discharge from hospital, MD  decision/surgical intervention, no progress towards goals, refusal/missing three consecutive treatments without notification or medical reason. ° °GP °   ° B. Van Fleet PT, DPT °Acute Rehabilitation Services °Pager [phone] °Office [phone] ° ° B Van Fleet °06/18/2019, 9:52 AM ° ° °

## 2019-06-19 DIAGNOSIS — Z66 Do not resuscitate: Secondary | ICD-10-CM

## 2019-06-19 LAB — RENAL FUNCTION PANEL
Albumin: 2.1 g/dL — ABNORMAL LOW (ref 3.5–5.0)
Albumin: 2 g/dL — ABNORMAL LOW (ref 3.5–5.0)
Anion gap: 7 (ref 5–15)
Anion gap: 8 (ref 5–15)
BUN: 41 mg/dL — ABNORMAL HIGH (ref 8–23)
BUN: 41 mg/dL — ABNORMAL HIGH (ref 8–23)
CO2: 27 mmol/L (ref 22–32)
CO2: 27 mmol/L (ref 22–32)
Calcium: 8.2 mg/dL — ABNORMAL LOW (ref 8.9–10.3)
Calcium: 8.1 mg/dL — ABNORMAL LOW (ref 8.9–10.3)
Chloride: 98 mmol/L (ref 98–111)
Chloride: 97 mmol/L — ABNORMAL LOW (ref 98–111)
Creatinine, Ser: 1.02 mg/dL (ref 0.61–1.24)
Creatinine, Ser: 1.12 mg/dL (ref 0.61–1.24)
GFR calc Af Amer: >60 mL/min (ref >60)
GFR calc Af Amer: >60 mL/min (ref >60)
GFR calc non Af Amer: >60 mL/min (ref >60)
GFR calc non Af Amer: >60 mL/min (ref >60)
Glucose, Bld: 140 mg/dL — ABNORMAL HIGH (ref 70–99)
Glucose, Bld: 258 mg/dL — ABNORMAL HIGH (ref 70–99)
Phosphorus: 2.6 mg/dL (ref 2.5–4.6)
Phosphorus: 3.2 mg/dL (ref 2.5–4.6)
Potassium: 3.8 mmol/L (ref 3.5–5.1)
Potassium: 4.2 mmol/L (ref 3.5–5.1)
Sodium: 132 mmol/L — ABNORMAL LOW (ref 135–145)
Sodium: 132 mmol/L — ABNORMAL LOW (ref 135–145)

## 2019-06-19 LAB — GLUCOSE, CAPILLARY
Glucose-Capillary: 133 mg/dL — ABNORMAL HIGH (ref 70–99)
Glucose-Capillary: 147 mg/dL — ABNORMAL HIGH (ref 70–99)
Glucose-Capillary: 150 mg/dL — ABNORMAL HIGH (ref 70–99)
Glucose-Capillary: 186 mg/dL — ABNORMAL HIGH (ref 70–99)
Glucose-Capillary: 203 mg/dL — ABNORMAL HIGH (ref 70–99)
Glucose-Capillary: 235 mg/dL — ABNORMAL HIGH (ref 70–99)

## 2019-06-19 LAB — COOXEMETRY PANEL
Carboxyhemoglobin: 1.3 % (ref 0.5–1.5)
Methemoglobin: 0.9 % (ref 0.0–1.5)
O2 Saturation: 68.8 %
Total hemoglobin: 7.7 g/dL — ABNORMAL LOW (ref 12.0–16.0)

## 2019-06-19 LAB — CBC
HCT: 23.5 % — ABNORMAL LOW (ref 39.0–52.0)
Hemoglobin: 7.2 g/dL — ABNORMAL LOW (ref 13.0–17.0)
MCH: 28.7 pg (ref 26.0–34.0)
MCHC: 30.6 g/dL (ref 30.0–36.0)
MCV: 93.6 fL (ref 80.0–100.0)
Platelets: 207 10*3/uL (ref 150–400)
RBC: 2.51 MIL/uL — ABNORMAL LOW (ref 4.22–5.81)
RDW: 15.5 % (ref 11.5–15.5)
WBC: 10 10*3/uL (ref 4.0–10.5)
nRBC: 0 % (ref 0.0–0.2)

## 2019-06-19 LAB — MAGNESIUM: Magnesium: 2.6 mg/dL — ABNORMAL HIGH (ref 1.7–2.4)

## 2019-06-19 MED ORDER — PRISMASOL BGK 0/2.5 32-2.5 MEQ/L IV SOLN
INTRAVENOUS | Status: DC
  Filled 2019-06-19 (×7): qty 5000

## 2019-06-19 MED ORDER — PRISMASOL BGK 0/2.5 32-2.5 MEQ/L IV SOLN
INTRAVENOUS | Status: DC
  Filled 2019-06-19 (×5): qty 5000

## 2019-06-19 MED ORDER — PRISMASOL BGK 4/2.5 32-4-2.5 MEQ/L IV SOLN: INTRAVENOUS | Status: DC

## 2019-06-19 NOTE — Progress Notes (Addendum)
TCTS DAILY ICU PROGRESS NOTE                   [address]            [place] [address]          [phone]   48 Days Post-Op Procedure(s) (LRB): VIDEO BRONCHOSCOPY USING DISPOSABLE ANESTHESIA SCOPE (N/A) TRACHEOSTOMY (N/A)  Total Length of Stay:  LOS: 66 days   Subjective:  He is sleeping this am ane he awakened briefly when nurse was changing CVVHD equipment (filter clogged). Per nurse, he was on trach collar for about 8-9 hours yesterday. He was able to watch grandson's graduation.  Objective: Vital signs in last 24 hours: Temp:  [97.1 F (36.2 C)-98.4 F (36.9 C)] 97.7 F (36.5 C) (06/03 0500) Pulse Rate:  [75-88] 88 (06/03 0700) Cardiac Rhythm: Normal sinus rhythm (06/02 2000) Resp:  [15-32] 25 (06/03 0700) BP: (78-111)/(46-76) 89/54 (06/03 0700) SpO2:  [92 %-100 %] 100 % (06/03 0700) FiO2 (%):  [40 %-60 %] 50 % (06/03 0320) Weight:  [60.3 kg] 60.3 kg (06/03 0500)  Filed Weights   06/17/19 0500 06/18/19 0452 06/19/19 0500  Weight: 65.1 kg 62.5 kg 60.3 kg    Weight change: -2.2 kg   CVP:  [2 mmHg-10 mmHg] 2 mmHg  Intake/Output from previous day: 06/02 0701 - 06/03 0700 In: 2031.1 [I.V.:200.9; NG/GT:1570; IV Piggyback:260.3] Out: 2820   Intake/Output this shift: No intake/output data recorded.  Current Meds: Scheduled Meds: . aspirin EC  325 mg Oral Daily   Or  . aspirin  324 mg Per Tube Daily  . B-complex with vitamin C  1 tablet Per Tube Daily  . citalopram  10 mg Per Tube Daily  . collagenase   Topical Daily  . darbepoetin (ARANESP) injection - NON-DIALYSIS  150 mcg Subcutaneous Q Tue-1800  . feeding supplement (PRO-STAT SUGAR FREE 64)  30 mL Per Tube BID  . feeding supplement (VITAL 1.5 CAL)  1,000 mL Per Tube Q24H  . fentaNYL  1 patch Transdermal Q72H  . fludrocortisone  0.2 mg Oral Daily  . gabapentin  100 mg Oral QHS  . insulin aspart  0-6 Units Subcutaneous Q4H  . mouth rinse  15 mL Mouth Rinse Q2H  . melatonin  3 mg Per  Tube QHS  . metoCLOPramide (REGLAN) injection  10 mg Intravenous Q6H  . midodrine  20 mg Per Tube Q8H  . nutrition supplement (JUVEN)  1 packet Per Tube BID  . pantoprazole sodium  40 mg Per Tube BID  . QUEtiapine  25 mg Oral QHS  . rOPINIRole  0.5 mg Oral QHS  . sodium chloride flush  10-40 mL Intracatheter Q12H   Continuous Infusions: . sodium chloride 250 mL (06/10/19 1053)  . dextrose 5 % and 0.9% NaCl Stopped (06/06/19 0513)  . DOBUTamine 5 mcg/kg/min (06/19/19 0700)  . norepinephrine (LEVOPHED) Adult infusion 3 mcg/min (06/19/19 0700)  . prismasol BGK 2/2.5 dialysis solution 1,000 mL/hr at 06/19/19 0307  . prismasol BGK 2/2.5 replacement solution 500 mL/hr at 06/18/19 2116  . prismasol BGK 2/2.5 replacement solution 300 mL/hr at 06/18/19 1851   PRN Meds:.sodium chloride, alum & mag hydroxide-simeth, dextrose, diphenhydrAMINE, fentaNYL (SUBLIMAZE) injection, Gerhardt's butt cream, heparin, heparin, levalbuterol, ondansetron (ZOFRAN) IV, pneumococcal 23 valent vaccine, polyethylene glycol, sennosides, sodium chloride flush, sorbitol, traMADol  Heart: RRR Lungs: Somewhat coarse breath sounds Abdomen: Soft, bowel sounds present Extremities: Boots in place Wound: Clean and dry.  Right chest tube:  Little to no murky like drainage of late  Lab Results: CBC: Recent Labs    06/17/19 0428 06/18/19 0731  WBC 7.9 9.0  HGB 6.6* 8.0*  HCT 22.2* 25.8*  PLT 177 218   BMET:  Recent Labs    06/18/19 1622 06/19/19 0444  NA 133* 132*  K 3.9 3.8  CL 96* 98  CO2 28 27  GLUCOSE 234* 140*  BUN 42* 41*  CREATININE 1.10 1.02  CALCIUM 8.1* 8.2*    CMET: Lab Results  Component Value Date   WBC 9.0 06/18/2019   HGB 8.0 (L) 06/18/2019   HCT 25.8 (L) 06/18/2019   PLT 218 06/18/2019   GLUCOSE 140 (H) 06/19/2019   CHOL 174 08/21/2017   TRIG 143 08/21/2017   HDL 57 08/21/2017   LDLCALC 88 08/21/2017   ALT 14 06/06/2019   AST 24 06/06/2019   NA 132 (L) 06/19/2019   K 3.8  06/19/2019   CL 98 06/19/2019   CREATININE 1.02 06/19/2019   BUN 41 (H) 06/19/2019   CO2 27 06/19/2019   TSH 0.925 04/12/2019   INR 1.6 (H) 05/01/2019   HGBA1C 9.0 (H) 04/12/2019   MICROALBUR 30 08/21/2017    PT/INR:  No results for input(s): LABPROT, INR in the last 72 hours. Radiology: No results found.  Assessment/Plan: S/P Procedure(s) (LRB): VIDEO BRONCHOSCOPY USING DISPOSABLE ANESTHESIA SCOPE (N/A) TRACHEOSTOMY (N/A)  1. CV-S/p removal of Impella on 04/09.  S/p V tach arrest 04/15. Previous a fib. SR with HR in the 70-80's this am.  On  Midodrine 20 mg tid and Nor epinephrine 3 mcg/min, and Dobutamine drip 5 mcg/kg/min. Co ox this am slightly increased to 68.8 2. Pulmonary-S/p trach 04/16.  Right chest tube with no recorded ouptut last 24 hours. Trach collar as tolerates during the day and nocturnal vent support at night 3. Expected post op blood loss anemia-Await this am's H and H. Had transfusion yesterday. Continue Aranesp 4. DM-CBGs 190/150/133 . He was on Metformin 1000 mg bid prior to surgery, but will continue on Insulin for now as NPO, TFs. Pre op HGA1C 9.  5. Oliguric/anuric AKI-Creatinine decreased to 1.02 this am. He had CRRT yesterday. Nephrology following and arranging for CVVHD accordingly.  6. GI-severe malnutrition of chronic illness.  Cortrak, TFs at 60 ml/hr. Speech pathology placed post pyloric Cortrak recently as existing Cortak stopped functioning 7. Acute systolic heart failure-CVVHD helping with volume status 8. Extremely deconditioned-will need PT as able. Per family wishes, continue full support;grandson graduates tomorrow  80. Hyponatremia-sodium remains 134 or less (132) 10. Family to have a meeting today to help decide how aggressive (or not) to be with continued care.  [name] PA-C 06/19/2019 7:33 AM     Goals of care to be revisited after family event yesterday. Appropriate to transition off CRT to try iHD  and if not possible then  comfort care  Partient awake and responsive this am  patient examined and medical record reviewed,agree with above note. [name] 06/19/2019

## 2019-06-19 NOTE — Progress Notes (Addendum)
[facility] NEPHROLOGY PROGRESS NOTE  Assessment/ Plan: Pt is a 64 y.o. yo male admitted to [facility] 3/26 with SOB, new low EF and NSTEMI txd to [facility] 3/29: LHC 3 vessel CAD s/p CABG and MVR complicated by cardiogenic shock, IABP, Impella, and worsening volume status/respiratory status and started on CRRT 04/26/19.Admission Scr was 0.99.  #Anuric AKI: CRRT started on 4/10.  Tried intermittent hemodialysis multiple times unfortunately he did not tolerate because of worsening hypotension.  The CRRT was resumed on 5/26 with inotropes and pressor support.   Electrolytes are acceptable and euvolemic on exam.  Had a CRRT running even yesterday.  We will continue CRRT until the [place] meeting with family and palliative care today.  Unable to tolerate intermittent HD before and BP remains low on two pressors therefore I recommend hospice care.  #Acute hypoxic respiratory failure: On tracheostomy and has right chest tube.  Pulmonary is following.  Volume status acceptable.  #Cardiogenic shock/acute systolic CHF: Impella placed on 4/2 and removed on 4/9.  Status post CABG/MV repair currently on dobutamine, Levophed.    #Anemia due to acute blood loss and critical illness: Monitor hemoglobin.  Transfuse as needed.  #Hyponatremia: Managed with CRRT.  Monitor.  #Hypophosphatemia: Repleted phosphate.  Monitor lab.  #Severe debility/malnutrition.  PT evaluation  #Disposition: He is very deconditioned.  Blood pressure remains low despite of pressors.  Clearly did not tolerate intermittent hemodialysis.  Therefore recommend hospice care as discussed above.  Subjective: Seen and examined in ICU.  Off of bed.  Had CRRT running even yesterday.  Filter clotted this morning and was interrupted this morning.   Noted plan for family meeting.  No new event. Objective Vital signs in last 24 hours: Vitals:   06/19/19 0800 06/19/19 0815 06/19/19 0830 06/19/19 0835  BP: 102/62 119/66 100/60   Pulse: 93 90 91    Resp: (!) 25 (!) 25 (!) 25   Temp:      TempSrc:      SpO2: 100% 100% 97% 100%  Weight:      Height:       Weight change: -2.2 kg  Intake/Output Summary (Last 24 hours) at 06/19/2019 0843 Last data filed at 06/19/2019 0800 Gross per 24 hour  Intake 2032.47 ml  Output 2755 ml  Net -722.53 ml       Labs: Basic Metabolic Panel: Recent Labs  Lab 06/18/19 0334 06/18/19 1622 06/19/19 0444  NA 133* 133* 132*  K 4.2 3.9 3.8  CL 97* 96* 98  CO2 27 28 27   GLUCOSE 181* 234* 140*  BUN 41* 42* 41*  CREATININE 1.07 1.10 1.02  CALCIUM 8.4* 8.1* 8.2*  PHOS 2.0* 4.5 2.6   Liver Function Tests: Recent Labs  Lab 06/18/19 0334 06/18/19 1622 06/19/19 0444  ALBUMIN 2.0* 2.1* 2.1*   No results for input(s): LIPASE, AMYLASE in the last 168 hours. No results for input(s): AMMONIA in the last 168 hours. CBC: Recent Labs  Lab 06/13/19 0341 06/13/19 1134 06/14/19 0407 06/14/19 0407 06/15/19 0345 06/17/19 0428 06/18/19 0731  WBC 5.4  --  5.5   < > 5.5 7.9 9.0  HGB 7.8*   < > 7.1*   < > 7.0* 6.6* 8.0*  HCT 25.6*   < > 23.8*   < > 23.5* 22.2* 25.8*  MCV 97.7  --  97.5  --  96.7 97.4 92.8  PLT 129*  --  128*   < > 131* 177 218   < > = values  in this interval not displayed.   Cardiac Enzymes: No results for input(s): CKTOTAL, CKMB, CKMBINDEX, TROPONINI in the last 168 hours. CBG: Recent Labs  Lab 06/18/19 1617 06/18/19 1953 06/19/19 0002 06/19/19 0432 06/19/19 0803  GLUCAP 208* 190* 150* 133* 147*    Iron Studies: No results for input(s): IRON, TIBC, TRANSFERRIN, FERRITIN in the last 72 hours. Studies/Results: No results found.  Medications: Infusions: . sodium chloride 250 mL (06/10/19 1053)  . dextrose 5 % and 0.9% NaCl Stopped (06/06/19 0513)  . DOBUTamine 5 mcg/kg/min (06/19/19 0800)  . norepinephrine (LEVOPHED) Adult infusion 5 mcg/min (06/19/19 0800)  . prismasol BGK 2/2.5 dialysis solution 1,000 mL/hr at 06/19/19 0307  . prismasol BGK 2/2.5 replacement  solution 500 mL/hr at 06/19/19 0744  . prismasol BGK 2/2.5 replacement solution 300 mL/hr at 06/18/19 1851    Scheduled Medications: . aspirin EC  325 mg Oral Daily   Or  . aspirin  324 mg Per Tube Daily  . B-complex with vitamin C  1 tablet Per Tube Daily  . citalopram  10 mg Per Tube Daily  . collagenase   Topical Daily  . darbepoetin (ARANESP) injection - NON-DIALYSIS  150 mcg Subcutaneous Q Tue-1800  . feeding supplement (PRO-STAT SUGAR FREE 64)  30 mL Per Tube BID  . feeding supplement (VITAL 1.5 CAL)  1,000 mL Per Tube Q24H  . fentaNYL  1 patch Transdermal Q72H  . fludrocortisone  0.2 mg Oral Daily  . gabapentin  100 mg Oral QHS  . insulin aspart  0-6 Units Subcutaneous Q4H  . mouth rinse  15 mL Mouth Rinse Q2H  . melatonin  3 mg Per Tube QHS  . metoCLOPramide (REGLAN) injection  10 mg Intravenous Q6H  . midodrine  20 mg Per Tube Q8H  . nutrition supplement (JUVEN)  1 packet Per Tube BID  . pantoprazole sodium  40 mg Per Tube BID  . QUEtiapine  25 mg Oral QHS  . rOPINIRole  0.5 mg Oral QHS  . sodium chloride flush  10-40 mL Intracatheter Q12H    have reviewed scheduled and prn medications.  Physical Exam: General: Critically ill looking male lying on bed, alert awake and following simple commands Neck: Tracheostomy. Heart:RRR, s1s2 nl Lungs: Coarse breath sound Abdomen:soft, Non-tender, non-distended Extremities:No edema Dialysis Access: Left IJ TDC placed by IR on 5/3/201.  Kymia Simi Prasad Lourdes Kucharski 06/19/2019,8:43 AM  LOS: 66 days  Pager: [phone]

## 2019-06-19 NOTE — Progress Notes (Signed)
Physical Therapy Wound Treatment Patient Details  Name: Brendan [name] MRN: [id number] Date of Birth: 31-Aug-1955  Today's Date: 06/19/2019 Time: 0867-6195 Time Calculation (min): 42 min  Subjective  Subjective: Pt nodded yes if asked if his grandon's graduation ceremony went well Prior Treatments: dressing changes   Pain Score: Generalized; pre medicated  Wound Assessment  Pressure Injury 05/30/19 Coccyx Medial;Mid Stage 3 -  Full thickness tissue loss. Subcutaneous fat may be visible but bone, tendon or muscle are NOT exposed. Previously deep tissue wound from 05/13/19 (Active)  Wound Image   06/17/19 1000  Dressing Type Moist to moist;ABD;Other (Comment);Gauze (Comment) 06/19/19 1705  Dressing Clean;Dry;Intact 06/19/19 1705  Dressing Change Frequency Daily 06/19/19 1705  State of Healing Non-healing 06/19/19 1705  Site / Wound Assessment Pink;Red;Yellow;Clean;Pale 06/19/19 1705  % Wound base Red or Granulating 0% 06/19/19 1705  % Wound base Yellow/Fibrinous Exudate 95% 06/19/19 1705  % Wound base Black/Eschar 5% 06/19/19 1705  Peri-wound Assessment Intact;Erythema (blanchable);Maceration 06/19/19 1705  Wound Length (cm) 4.7 cm 06/17/19 1000  Wound Width (cm) 3.8 cm 06/17/19 1000  Wound Depth (cm) 0 cm 06/11/19 2300  Wound Surface Area (cm^2) 17.86 cm^2 06/17/19 1000  Wound Volume (cm^3) 0 cm^3 06/11/19 2300  Margins Unattached edges (unapproximated) 06/19/19 1705  Drainage Amount None 06/19/19 1705  Drainage Description No odor;Serosanguineous 06/19/19 1705  Treatment Debridement (Selective);Hydrotherapy (Pulse lavage);Packing (Saline gauze) 06/19/19 1705   Santyl applied to wound bed prior to applying dressing.    Hydrotherapy Pulsed lavage therapy - wound location: sacrum Pulsed Lavage with Suction (psi): 12 psi(4-12 psi) Pulsed Lavage with Suction - Normal Saline Used: 1000 mL Pulsed Lavage Tip: Tip with splash shield Selective Debridement Selective Debridement -  Location: sacrum Selective Debridement - Tools Used: Scalpel;Forceps(utilized scalpel to score eschar ) Selective Debridement - Tissue Removed: softened gray/white eschar   Wound Assessment and Plan  Wound Therapy - Assess/Plan/Recommendations Wound Therapy - Clinical Statement: Able to debride soft eschar from superficial layer of wound bed. Tissue underneath with poor perfusion. Pt will continue to benefit from hydrotherapy for eventual removal of eschar, decreasing bioburden and improving healing.  Wound Therapy - Functional Problem List: decreased mobility, increased kidney and lung supportive machinery limiting mobility,   Factors Delaying/Impairing Wound Healing: Immobility;Multiple medical problems;Other (comment)(decreased nutrition) Hydrotherapy Plan: Debridement;Patient/family education;Pulsatile lavage with suction;Dressing change Wound Therapy - Frequency: 6X / week Wound Therapy - Follow Up Recommendations: Skilled nursing facility Wound Plan: see above  Wound Therapy Goals- Improve the function of patient's integumentary system by progressing the wound(s) through the phases of wound healing (inflammation - proliferation - remodeling) by: Decrease Necrotic Tissue to: 75 Decrease Necrotic Tissue - Progress: Progressing toward goal Increase Granulation Tissue to: 20 Increase Granulation Tissue - Progress: Progressing toward goal Decrease Length/Width/Depth by (cm): 0.5 Goals/treatment plan/discharge plan were made with and agreed upon by patient/family: Yes Time For Goal Achievement: 7 days Wound Therapy - Potential for Goals: Good  Goals will be updated until maximal potential achieved or discharge criteria met.  Discharge criteria: when goals achieved, discharge from hospital, MD decision/surgical intervention, no progress towards goals, refusal/missing three consecutive treatments without notification or medical reason.  GP       Wyona Almas, PT, DPT Acute  Rehabilitation Services Pager [phone] Office [phone]   Deno Etienne 06/19/2019, 5:10 PM

## 2019-06-19 NOTE — Progress Notes (Addendum)
   PCCM Interval Note  Patient tolerated 8 hours of ATC yesterday and able to watch grandson's graduation.  Rested on MV overnight.  Doing well this morning so far on ATC.  Patient reports he feels slightly SOB is in no distress.  Will continue to monitor closely.  Good progression.  Due for trach change today.   PCCM will continue to follow.  No charge.       Kennieth Rad, MSN, AGACNP-BC [facility] 06/19/2019, 10:20 AM  See Amion for personal pager PCCM on call pager [phone]   He is tolerating trach collar daytime Main issue remains that he is dependent on low-dose Levophed/dobutamine in spite of midodrine and fludrocortisone etc.  He also has been unable to transition off CRRT. Current plan is to reassess goals of care now that he was able to witness his grandsons graduation on 6/2 [facility] if we should trial intermittent dialysis again  [employer]. Elsworth Soho MD

## 2019-06-19 NOTE — Progress Notes (Signed)
   06/19/19 1614  Clinical Encounter Type  Visited With Patient and family together  Visit Type Follow-up;Spiritual support  Referral From Palliative care team;Nurse  Consult/Referral To Chaplain;Palliative care  Spiritual Encounters  Spiritual Needs Prayer;Emotional;Grief support  Stress Factors  Patient Stress Factors Health changes  Family Stress Factors Family relationships;Health changes;Major life changes  This chaplain responded to PMT NP-Amber's referral for spiritual care with Pt. and family after the PMT updated Pt. Goals of Care.  The chaplain witnessed mixed emotions and grief from the family.  The chaplain's reflective listening with the Pt. daughter allowed her to express her unique feelings and find a place to settle in her emotions.  The chaplain understands additional family members will be visiting tonight.  The chaplain will F/U with the Pt. and family on Friday morning.

## 2019-06-19 NOTE — Progress Notes (Signed)
Palliative Medicine Inpatient Follow Up Note   HPI: Palliative Care consult requested for goals of care discussion in this63 y.o.malewith multiple medical problems including poorly controlled diabetes complicated by neuropathy and hypertension. Patient presented to outside hospital on 3/26 and transferred to [facility] for further evaluation due to elevated troponin and newly depressed EF. During his initial work-up patient was found to have a blood sugar of 516, BUN53, creatinine 1.2, and anion gap of 17. Patient's BNP was elevated to 8900. Troponin was elevated to 13.1 with changes noted by the team of their lateral leads as well as V1 and V2.Chest x-ray showed concerns for bilateral pulmonary opacities. Patient was initiated on aspirin, heparin drip, and IV antibiotics. Recent echo showed EF of 25%. Since admission patient required intubation on 4/1, extubated on 4/4, re-intubated 4/6 for CABG 4/6, extubated post procedure 4/7, CRRT initiated on 4/10with brief transition to iHD and CRRT began again 5/7, required re-intubation on 4/12 due to respiratory failure, tracheostomy performed on 4/16, 5/7 right chest tube placed due to enlarging effusion. Continues to require midodrine and pressorsas well as ventilator support.CRRT holiday with trial of iHD planned for 5/25. Failed trial of iHD  5/25 due to hypotension and CRRT restarted 5/26.  06/18/2019 - Patient was able to attend his grandsons graduation ceremony last night.    Today's Discussion (06/19/2019): I met this afternoon with Macarthur Critchley (spouse), Lawerance Bach (daughter), Cybel (RN), Museum/gallery conservator (NP), Dr. Joelyn Oms (renal)and myself. I was able speak with Dr. Jeffie Pollock and Dr. Phineas Real earlier in the day. Patient has no progressed and has remained on CRRT. He is not a candidate for iHD. Patient has a very poor prognosis at this juncture and the present interventions appear futile.   Tabitha asked why she had not spoken to renal since Raad had  been here. She said that she wanted a second opinion regarding her fathers kidney function. We discussed that ten nephrologists have seen Breylen since he has been here and at this juncture their recommendations are synonymous.   Discussed how difficult this must be at the present time regarding Jasmin's rapid decline. Emphasized that no matter what we do at this point we cannot undue the organ dysfunction and damage that exists at the present time.   Stated that patient will not survive for very long once CRRT is stopped. Was met with great opposition by [place] for quite sometime during our meeting. Starling and his wife were able to converse and Rufino decided that he does not want a second opinion on his kidney function. He was able to clearly state that he wants to focus on comfort oriented care. The only request of the patient at this point is to see his grandson, sister, and brothers.   I asked Jasher if during this time for any reason his heart stops if he would want resuscitation. He clearly stated "no" with his passy muir valve on. This caused a bit of an upheaval resulting in Ashley out of the room.   Cybel and Dr. Joelyn Oms shared with Cecille Rubin that peritoneal HD would not be a good option at this juncture. Dr. Joelyn Oms was able to clearly state that all measures to evaluate for kidney improvement have been pursued, unfortunately all options have been exhausted at this point in time.    Discussed what comfort oriented care looks like inclusive of medications to control pain, dyspnea, agitation, nausea, itching, and hiccups.  We discussed stopping all uneccessary measures such as blood draws, needle sticks,  and frequent vital signs. Utilized reflective listening throughout our time together. 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 He vocalized having "pain all over" and wishing for relief of this.   We plan to allow family to visit and with coordination with  family start care cessation thereafter. I recommended not prolonging Jaicion's degree of suffering any further. We agreed upon no more than 24-48 hours for further decisions to be made.  Recommendations and Plan: DNR  Provide 24-48hrs for family to visit then the plan will be to transition to comfort measures and remove CRRT, pressors support, and focus primarily on comfort.  Discussed with medical providers  Time In: 1400 Time Out: 1500 Time Spent: 60 minutes Greater than 50% of the time was spent in counseling and coordination of care ______________________________________________________________________________________ [place] Team Team Cell Phone: [phone] Please utilize secure chat with additional questions, if there is no response within 30 minutes please call the above phone number  Palliative Medicine Team providers are available by phone from 7am to 7pm daily and can be reached through the team cell phone.  Should this patient require assistance outside of these hours, please call the patient's attending physician.

## 2019-06-19 NOTE — Progress Notes (Signed)
I was asked to participate in a goals of care conversation with palliative care, nursing, patient, and wife.  At the time of my arrival, the patient had just clearly expressed a desire to transition to comfort measures.  There has been some question about whether or not PD could be employed to assist him, as he is now CRRT dependent, requiring inotropes and vasopressors.  It is not possible for him to transition to PD, I let them know that that was a reasonable question, but not a feasible option.  Given his prolonged critical illness, dialysis dependence, and failure to tolerate intermittent hemodialysis with ongoing pressor dependent hypotension, I agree with transitioning to comfort measures.  Several family members might want to visit, I think we can continue CRRT to prevent visitation.  Palliative will assist with timing of transition to comfort measures.

## 2019-06-19 NOTE — Procedures (Addendum)
.  Tracheostomy Change Note  Patient Details:   Name: Brendan [name] DOB: 1955-07-01 MRN: [id number]    Airway Documentation:     Evaluation  O2 sats: stable throughout Complications: No apparent complications Patient did tolerate procedure well. Bilateral Breath Sounds: Rhonchi   Routine trach change was done without any complications. Lurline Idol was changed to to a #6 cuffed shiley. Lurline Idol was secured with trach ties. Positive color change on CO2 detector.     Colleen Kotlarz, Eddie North 06/19/2019, 10:58 AM

## 2019-06-20 LAB — GLUCOSE, CAPILLARY
Glucose-Capillary: 109 mg/dL — ABNORMAL HIGH (ref 70–99)
Glucose-Capillary: 140 mg/dL — ABNORMAL HIGH (ref 70–99)
Glucose-Capillary: 176 mg/dL — ABNORMAL HIGH (ref 70–99)
Glucose-Capillary: 180 mg/dL — ABNORMAL HIGH (ref 70–99)
Glucose-Capillary: 211 mg/dL — ABNORMAL HIGH (ref 70–99)
Glucose-Capillary: 89 mg/dL (ref 70–99)

## 2019-06-20 LAB — RENAL FUNCTION PANEL
Albumin: 2 g/dL — ABNORMAL LOW (ref 3.5–5.0)
Albumin: 2.1 g/dL — ABNORMAL LOW (ref 3.5–5.0)
Anion gap: 6 (ref 5–15)
Anion gap: 13 (ref 5–15)
BUN: 36 mg/dL — ABNORMAL HIGH (ref 8–23)
BUN: 35 mg/dL — ABNORMAL HIGH (ref 8–23)
CO2: 28 mmol/L (ref 22–32)
CO2: 26 mmol/L (ref 22–32)
Calcium: 8.2 mg/dL — ABNORMAL LOW (ref 8.9–10.3)
Calcium: 8.2 mg/dL — ABNORMAL LOW (ref 8.9–10.3)
Chloride: 98 mmol/L (ref 98–111)
Chloride: 94 mmol/L — ABNORMAL LOW (ref 98–111)
Creatinine, Ser: 0.89 mg/dL (ref 0.61–1.24)
Creatinine, Ser: 0.91 mg/dL (ref 0.61–1.24)
GFR calc Af Amer: >60 mL/min (ref >60)
GFR calc Af Amer: >60 mL/min (ref >60)
GFR calc non Af Amer: >60 mL/min (ref >60)
GFR calc non Af Amer: >60 mL/min (ref >60)
Glucose, Bld: 181 mg/dL — ABNORMAL HIGH (ref 70–99)
Glucose, Bld: 212 mg/dL — ABNORMAL HIGH (ref 70–99)
Phosphorus: 2.2 mg/dL — ABNORMAL LOW (ref 2.5–4.6)
Phosphorus: 5.2 mg/dL — ABNORMAL HIGH (ref 2.5–4.6)
Potassium: 4 mmol/L (ref 3.5–5.1)
Potassium: 4.1 mmol/L (ref 3.5–5.1)
Sodium: 132 mmol/L — ABNORMAL LOW (ref 135–145)
Sodium: 133 mmol/L — ABNORMAL LOW (ref 135–145)

## 2019-06-20 LAB — MAGNESIUM: Magnesium: 2.5 mg/dL — ABNORMAL HIGH (ref 1.7–2.4)

## 2019-06-20 LAB — CBC
HCT: 23.4 % — ABNORMAL LOW (ref 39.0–52.0)
Hemoglobin: 7.2 g/dL — ABNORMAL LOW (ref 13.0–17.0)
MCH: 28.9 pg (ref 26.0–34.0)
MCHC: 30.8 g/dL (ref 30.0–36.0)
MCV: 94 fL (ref 80.0–100.0)
Platelets: 226 10*3/uL (ref 150–400)
RBC: 2.49 MIL/uL — ABNORMAL LOW (ref 4.22–5.81)
RDW: 15.5 % (ref 11.5–15.5)
WBC: 9.5 10*3/uL (ref 4.0–10.5)
nRBC: 0.2 % (ref 0.0–0.2)

## 2019-06-20 LAB — COOXEMETRY PANEL
Carboxyhemoglobin: 1.5 % (ref 0.5–1.5)
Methemoglobin: 1.3 % (ref 0.0–1.5)
O2 Saturation: 58.6 %
Total hemoglobin: 8.8 g/dL — ABNORMAL LOW (ref 12.0–16.0)

## 2019-06-20 LAB — TYPE AND SCREEN
ABO/RH(D): O NEG
Antibody Screen: NEGATIVE

## 2019-06-20 MED ORDER — SODIUM PHOSPHATES 45 MMOLE/15ML IV SOLN
20.0000 mmol | Freq: Once | INTRAVENOUS | Status: AC
  Administered 2019-06-20: 20 mmol via INTRAVENOUS
  Filled 2019-06-20: qty 6.67

## 2019-06-20 NOTE — Progress Notes (Addendum)
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 Patient presented to outside hospital on 3/26 and transferred to [facility] for further evaluation due to elevated troponin and newly depressed EF. During his initial work-up patient was found to have a blood sugar of 516, BUN53, creatinine 1.2, and anion gap of 17. Patient's BNP was elevated to 8900. Troponin was elevated to 13.1 with changes noted by the team of their lateral leads as well as V1 and V2.Chest x-ray showed concerns for bilateral pulmonary opacities. Patient was initiated on aspirin, heparin drip, and IV antibiotics. Recent echo showed EF of 25%. Since admission patient required intubation on 4/1, extubated on 4/4, re-intubated 4/6 for CABG 4/6, extubated post procedure 4/7, CRRT initiated on 4/10with brief transition to iHD and CRRT began again 5/7, required re-intubation on 4/12 due to respiratory failure, tracheostomy performed on 4/16, 5/7 right chest tube placed due to enlarging effusion. Continues to require midodrine and pressorsas well as ventilator support.CRRT holiday with trial of iHD planned for 5/25. Failed trial of iHD  5/25 due to hypotension and CRRT restarted 5/26.  06/18/2019 - Patient was able to attend his grandsons graduation ceremony last night.   06/19/2019 - [place] discussions with patient. Plan for transition to comfort measures.   Today's Discussion (06/20/2019): I met this afternoon with Brendan [name] and Brendan [name] this morning. Patients brother was present at bedside.   Lawerance Bach excused herself from the bedside and stated that she was not interested in speaking with [place] this morning.  We offered support to the family members present at bedside. Agreed that tomorrow at 11AM we will stop CRRT and remove from vent and stop  pressure support. During this time we plan to increase fentanyl gtt for symptom relief.   Recommendations and Plan: DNR  Transition to comfort measures tomorrow at 11AM  Continue to offer ongoing support  Appreciate Chaplain involvement  Time Out: 0930 Time Spent: 25 Greater than 50% of the time was spent in counseling and coordination of care ______________________________________________________________________________________ [place] Team Team Cell Phone: [phone] Please utilize secure chat with additional questions, if there is no response within 30 minutes please call the above phone number  Palliative Medicine Team providers are available by phone from 7am to 7pm daily and can be reached through the team cell phone.  Should this patient require assistance outside of these hours, please call the patient's attending physician.

## 2019-06-20 NOTE — Progress Notes (Signed)
Per patient's family, patient's brother is coming from [place] tomorrow and will not be able to be here until 2pm at the earliest. Family would like to wait to initiate comfort measures until patient's brother is able to see him. Will pass along to night shift RN so that they can notify bedside RN tomorrow morning.  [employer] RN

## 2019-06-20 NOTE — Progress Notes (Signed)
Chaplain completed F/U spiritual care visit.  The chaplain checks in with the Pt. RN-[place] before the visit. The Pt.wife- Brendan [name] is bedside with family. Brendan [name] is tearful as she updates the chaplain on the Pt. The chaplain hears and listens to [name]'s grief as she shares she is losing the love of her life.  The chaplain understands the Pt. childhood pastor will be visiting today.  F/U spiritual care will be continued as needed by the Pt. and family.

## 2019-06-20 NOTE — Progress Notes (Addendum)
TCTS DAILY ICU PROGRESS NOTE                   [address]            [place] [address]          [phone]   49 Days Post-Op Procedure(s) (LRB): VIDEO BRONCHOSCOPY USING DISPOSABLE ANESTHESIA SCOPE (N/A) TRACHEOSTOMY (N/A)  Total Length of Stay:  LOS: 67 days   Subjective: Patient resting this am  Objective: Vital signs in last 24 hours: Temp:  [97.6 F (36.4 C)-98 F (36.7 C)] 98 F (36.7 C) (06/04 0430) Pulse Rate:  [73-93] 75 (06/04 0645) Cardiac Rhythm: Normal sinus rhythm (06/04 0430) Resp:  [16-31] 25 (06/04 0645) BP: (73-121)/(43-84) 91/58 (06/04 0645) SpO2:  [89 %-100 %] 98 % (06/04 0645) FiO2 (%):  [40 %-60 %] 40 % (06/04 0326) Weight:  [60.6 kg] 60.6 kg (06/04 0600)  Filed Weights   06/18/19 0452 06/19/19 0500 06/20/19 0600  Weight: 62.5 kg 60.3 kg 60.6 kg    Weight change: 0.3 kg   CVP:  [2 mmHg-9 mmHg] 8 mmHg  Intake/Output from previous day: 06/03 0701 - 06/04 0700 In: 1620.6 [P.O.:150; I.V.:210.6; NG/GT:1260] Out: 2383   Intake/Output this shift: No intake/output data recorded.  Current Meds: Scheduled Meds: . aspirin EC  325 mg Oral Daily   Or  . aspirin  324 mg Per Tube Daily  . B-complex with vitamin C  1 tablet Per Tube Daily  . citalopram  10 mg Per Tube Daily  . collagenase   Topical Daily  . darbepoetin (ARANESP) injection - NON-DIALYSIS  150 mcg Subcutaneous Q Tue-1800  . feeding supplement (PRO-STAT SUGAR FREE 64)  30 mL Per Tube BID  . feeding supplement (VITAL 1.5 CAL)  1,000 mL Per Tube Q24H  . fentaNYL  1 patch Transdermal Q72H  . fludrocortisone  0.2 mg Oral Daily  . gabapentin  100 mg Oral QHS  . insulin aspart  0-6 Units Subcutaneous Q4H  . mouth rinse  15 mL Mouth Rinse Q2H  . melatonin  3 mg Per Tube QHS  . metoCLOPramide (REGLAN) injection  10 mg Intravenous Q6H  . midodrine  20 mg Per Tube Q8H  . nutrition supplement (JUVEN)  1 packet Per Tube BID  . pantoprazole sodium  40 mg Per Tube BID  .  QUEtiapine  25 mg Oral QHS  . rOPINIRole  0.5 mg Oral QHS  . sodium chloride flush  10-40 mL Intracatheter Q12H   Continuous Infusions: . sodium chloride 250 mL (06/10/19 1053)  . dextrose 5 % and 0.9% NaCl Stopped (06/06/19 0513)  . DOBUTamine 5 mcg/kg/min (06/20/19 0700)  . norepinephrine (LEVOPHED) Adult infusion 3 mcg/min (06/20/19 0700)  . prismasol BGK 2/2.5 replacement solution 500 mL/hr at 06/20/19 0424  . prismasol BGK 2/2.5 replacement solution 300 mL/hr at 06/20/19 0352  . prismasol BGK 4/2.5 1,500 mL/hr at 06/20/19 0457   PRN Meds:.sodium chloride, alum & mag hydroxide-simeth, dextrose, diphenhydrAMINE, fentaNYL (SUBLIMAZE) injection, Gerhardt's butt cream, heparin, heparin, levalbuterol, ondansetron (ZOFRAN) IV, pneumococcal 23 valent vaccine, polyethylene glycol, sennosides, sodium chloride flush, sorbitol, traMADol  Heart: RRR Lungs: Somewhat coarse breath sounds Abdomen: Soft, bowel sounds present Extremities: Boots in place Wound: Clean and dry.  Right chest tube: Little to no murky like drainage of late  Lab Results: CBC: Recent Labs    06/19/19 0828 06/20/19 0355  WBC 10.0 9.5  HGB 7.2* 7.2*  HCT 23.5* 23.4*  PLT 207  226   BMET:  Recent Labs    06/19/19 1600 06/20/19 0355  NA 132* 132*  K 4.2 4.0  CL 97* 98  CO2 27 28  GLUCOSE 258* 181*  BUN 41* 36*  CREATININE 1.12 0.89  CALCIUM 8.1* 8.2*    CMET: Lab Results  Component Value Date   WBC 9.5 06/20/2019   HGB 7.2 (L) 06/20/2019   HCT 23.4 (L) 06/20/2019   PLT 226 06/20/2019   GLUCOSE 181 (H) 06/20/2019   CHOL 174 08/21/2017   TRIG 143 08/21/2017   HDL 57 08/21/2017   LDLCALC 88 08/21/2017   ALT 14 06/06/2019   AST 24 06/06/2019   NA 132 (L) 06/20/2019   K 4.0 06/20/2019   CL 98 06/20/2019   CREATININE 0.89 06/20/2019   BUN 36 (H) 06/20/2019   CO2 28 06/20/2019   TSH 0.925 04/04/2019   INR 1.6 (H) 05/01/2019   HGBA1C 9.0 (H) 03/22/2019   MICROALBUR 30 08/21/2017    PT/INR:  No  results for input(s): LABPROT, INR in the last 72 hours. Radiology: No results found.  Assessment/Plan: S/P Procedure(s) (LRB): VIDEO BRONCHOSCOPY USING DISPOSABLE ANESTHESIA SCOPE (N/A) TRACHEOSTOMY (N/A)  1. CV-S/p removal of Impella on 04/09.  S/p V tach arrest 04/15. Previous a fib. SR with HR in the 70-80's this am.  On  Midodrine 20 mg tid and Nor epinephrine 3 mcg/min, and Dobutamine drip 5 mcg/kg/min. Co ox this am slightly increased to 58.6 2. Pulmonary-S/p trach 04/16.  Right chest tube with no recorded ouptut last 24 hours. Trach collar as tolerates during the day and nocturnal vent support at night 3. Expected post op blood loss anemia-H and H this am 7.2 and 23.4. Has had previous transfusions. Continue Aranesp 4. DM-CBGs 190/150/133 . He was on Metformin 1000 mg bid prior to surgery, but will continue on Insulin for now as NPO, TFs. Pre op HGA1C 9.  5. Oliguric/anuric AKI-Creatinine decreased to 0.89 this am. He had CRRT yesterday. Nephrology following and arranging for CVVHD accordingly.  6. GI-severe malnutrition of chronic illness.  Cortrak, TFs at 60 ml/hr. Speech pathology placed post pyloric Cortrak recently as existing Cortak stopped functioning 7. Acute systolic heart failure-CVVHD helping with volume status 8. Extremely deconditioned-will need PT as able. Per family wishes, continue full support;grandson graduates tomorrow  58. Hyponatremia-sodium remains 134 or less (132) 10. Palliative care note from family meeting yesterday noted. Per patient's wishes: DNR, he would like to see family, transition to comfort care, and stop pressor and CVVHD.  [name] PA-C 06/20/2019 7:48 AM     Palliative care has worked diligently with the family and patient to work through this difficult situation where he has no chance of survival outside of the hospital off life support and withdrawal of support is planned for tomorrow a.m  patient examined and medical record  reviewed,agree with above note. [name] 06/20/2019

## 2019-06-20 NOTE — Progress Notes (Signed)
PT Cancellation Note  Patient Details Name: Brendan [name] MRN: [id number] DOB: May 18, 1955   Cancelled Treatment:    Reason Eval/Treat Not Completed: Other (comment). Per chart review, pt decided to move towards comfort care tomorrow (see Palliative note). PT will sign off at this time due to new [place]. Thank you for this referral; please reconsult if new needs arise.  Mabeline Caras, PT, DPT Acute Rehabilitation Services  Pager [phone] Office [place] 06/20/2019, 11:09 AM

## 2019-06-20 NOTE — Progress Notes (Addendum)
PT Cancellation Note  Patient Details Name: Brendan [name] MRN: [id number] DOB: 08/05/1955   Cancelled Treatment:    Reason Eval/Treat Not Completed: Other (comment) After discussion with RN and chart review, pt transitioning to comfort care measures at 11 AM tomorrow. Will sign off of hydrotherapy at this time.    Wyona Almas, PT, DPT Acute Rehabilitation Services Pager [phone] Office [phone]    Deno Etienne 06/20/2019, 8:52 AM

## 2019-06-20 NOTE — Progress Notes (Signed)
Nutrition Brief Note  Chart reviewed. Pt now DNR, in process of transitioning to comfort care.  No further nutrition interventions warranted at this time.  Please re-consult as needed.   Kerman Passey MS, RDN, LDN, CNSC Registered Dietitian III RD Pager Number and RD On-Call Pager Number Located in [place]

## 2019-06-20 NOTE — Progress Notes (Signed)
SLP Cancellation Note  Patient Details Name: Brendan [name] MRN: [id number] DOB: 05/01/1955   Cancelled treatment:       Reason Eval/Treat Not Completed: Other (comment). Reviewed recent notes regarding plan for transition to comfort measures. Pt has been using PMSV while on ATC successfully with staff. Suggest offering comfort PO when appropriate. No further SLP interventions needed at this time. Will sign off   Tyona Nilsen, Katherene Ponto 06/20/2019, 8:06 AM

## 2019-06-20 NOTE — Progress Notes (Signed)
Per Dr Hulen Skains during progression rounds, visitation restrictions lifted so that family can visit with patient before transition to comfort care tomorrow.

## 2019-06-20 NOTE — Progress Notes (Signed)
[facility] NEPHROLOGY PROGRESS NOTE  Assessment/ Plan: Pt is a 64 y.o. 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  #Anuric AKI: CRRT started on 4/10.  Tried intermittent hemodialysis before but he did not tolerate because of hypotension.  Then CRRT was resumed on 5/26 with inotropes and pressor support. He is anuric without any sign of renal recovery.  Currently his blood pressure is around systolic 35-57D on Levophed and dobutamine.  I do not think he will tolerate another attempt of intermittent HD.  There was family meeting with palliative care and our nephrology service on 6/3.  Noted the family to visit with palliative care in 32 to 48 hours for possible transition to comfort measures.  Continue CRRT until then.   #Acute hypoxic respiratory failure/VDRF: Has tracheostomy and vent support.  Volume status acceptable.  #Cardiogenic shock/acute systolic CHF: Impella placed on 4/2 and removed on 4/9.  Status post CABG/MV repair currently on dobutamine, Levophed.    #Anemia due to acute blood loss and critical illness: Monitor hemoglobin.  Transfuse as needed.  #Hyponatremia: Managed with CRRT.  Monitor.  #Hypophosphatemia: Replete phosphate today.  Monitor lab.  #Severe debility/malnutrition.  PT evaluation  #Disposition: He is very deconditioned.  Blood pressure remains low despite of 2 pressors.  Clearly he did not tolerate intermittent hemodialysis.  Therefore recommend hospice care.  Subjective: Seen and examined in ICU.  Tolerating CRRT well..  Remains hypotensive on dobutamine and Levophed.  No urine output.  Objective Vital signs in last 24 hours: Vitals:   06/20/19 0800 06/20/19 0815 06/20/19 0830 06/20/19 0845  BP: 95/61 (!) 104/53 115/90 94/63   Pulse: 73 74 75 76  Resp: (!) 22 (!) 25 (!) 24 (!) 23  Temp:      TempSrc:      SpO2: 97% 98% 96% 97%  Weight:      Height:       Weight change: 0.3 kg  Intake/Output Summary (Last 24 hours) at 06/20/2019 0918 Last data filed at 06/20/2019 0900 Gross per 24 hour  Intake 1619.16 ml  Output 2378 ml  Net -758.84 ml       Labs: Basic Metabolic Panel: Recent Labs  Lab 06/19/19 0444 06/19/19 1600 06/20/19 0355  NA 132* 132* 132*  K 3.8 4.2 4.0  CL 98 97* 98  CO2 27 27 28   GLUCOSE 140* 258* 181*  BUN 41* 41* 36*  CREATININE 1.02 1.12 0.89  CALCIUM 8.2* 8.1* 8.2*  PHOS 2.6 3.2 2.2*   Liver Function Tests: Recent Labs  Lab 06/19/19 0444 06/19/19 1600 06/20/19 0355  ALBUMIN 2.1* 2.0* 2.0*   No results for input(s): LIPASE, AMYLASE in the last 168 hours. No results for input(s): AMMONIA in the last 168 hours. CBC: Recent Labs  Lab 06/15/19 0345 06/15/19 0345 06/17/19 0428 06/17/19 0428 06/18/19 0731 06/19/19 0828 06/20/19 0355  WBC 5.5   < > 7.9   < > 9.0 10.0 9.5  HGB 7.0*   < > 6.6*   < > 8.0* 7.2* 7.2*  HCT 23.5*   < > 22.2*   < > 25.8* 23.5* 23.4*  MCV 96.7  --  97.4  --  92.8 93.6 94.0  PLT 131*   < > 177   < > 218 207 226   < > =  values in this interval not displayed.   Cardiac Enzymes: No results for input(s): CKTOTAL, CKMB, CKMBINDEX, TROPONINI in the last 168 hours. CBG: Recent Labs  Lab 06/19/19 1125 06/19/19 1603 06/19/19 2020 06/20/19 0004 06/20/19 0812  GLUCAP 186* 235* 203* 109* 140*    Iron Studies: No results for input(s): IRON, TIBC, TRANSFERRIN, FERRITIN in the last 72 hours. Studies/Results: No results found.  Medications: Infusions: . sodium chloride 250 mL (06/10/19 1053)  . dextrose 5 % and 0.9% NaCl Stopped (06/06/19 0513)  . DOBUTamine 5 mcg/kg/min (06/20/19 0900)  . norepinephrine (LEVOPHED) Adult infusion 4 mcg/min (06/20/19 0900)  . prismasol BGK 2/2.5 replacement solution 500 mL/hr at 06/20/19 0424  . prismasol  BGK 2/2.5 replacement solution 300 mL/hr at 06/20/19 0352  . prismasol BGK 4/2.5 1,500 mL/hr at 06/20/19 2330    Scheduled Medications: . aspirin EC  325 mg Oral Daily   Or  . aspirin  324 mg Per Tube Daily  . B-complex with vitamin C  1 tablet Per Tube Daily  . citalopram  10 mg Per Tube Daily  . collagenase   Topical Daily  . darbepoetin (ARANESP) injection - NON-DIALYSIS  150 mcg Subcutaneous Q Tue-1800  . feeding supplement (PRO-STAT SUGAR FREE 64)  30 mL Per Tube BID  . feeding supplement (VITAL 1.5 CAL)  1,000 mL Per Tube Q24H  . fentaNYL  1 patch Transdermal Q72H  . fludrocortisone  0.2 mg Oral Daily  . gabapentin  100 mg Oral QHS  . insulin aspart  0-6 Units Subcutaneous Q4H  . mouth rinse  15 mL Mouth Rinse Q2H  . melatonin  3 mg Per Tube QHS  . metoCLOPramide (REGLAN) injection  10 mg Intravenous Q6H  . midodrine  20 mg Per Tube Q8H  . nutrition supplement (JUVEN)  1 packet Per Tube BID  . pantoprazole sodium  40 mg Per Tube BID  . QUEtiapine  25 mg Oral QHS  . rOPINIRole  0.5 mg Oral QHS  . sodium chloride flush  10-40 mL Intracatheter Q12H    have reviewed scheduled and prn medications.  Physical Exam: General: Critically ill looking male, lying on bed, following commands Neck: Tracheostomy and on vent. Heart:RRR, s1s2 nl Lungs: Coarse breath sound Abdomen:soft, Non-tender, non-distended Extremities:No edema Dialysis Access: Left IJ TDC placed by IR on 5/3/201.  Shatia Sindoni Prasad Shakeyla Giebler 06/20/2019,9:18 AM  LOS: 34 days  Pager: [phone]

## 2019-06-20 NOTE — Progress Notes (Signed)
OT Cancellation Note  Patient Details Name: Brendan [name] MRN: [id number] DOB: [birth year]   Cancelled Treatment:    Reason Eval/Treat Not Completed: Other (comment)(Per chart review, pt has made the decision to go comfort care tomorrow (see Palliative note). OT will sign off at this time due to new [place]. Thank you for this referral)  Zenovia Jarred, MSOT, OTR/L [facility] Office Number: [phone] Pager: [phone]  Zenovia Jarred 06/20/2019, 10:44 AM

## 2019-06-21 DIAGNOSIS — Z515 Encounter for palliative care: Secondary | ICD-10-CM

## 2019-06-21 LAB — CBC
HCT: 23.5 % — ABNORMAL LOW (ref 39.0–52.0)
Hemoglobin: 7 g/dL — ABNORMAL LOW (ref 13.0–17.0)
MCH: 28.3 pg (ref 26.0–34.0)
MCHC: 29.8 g/dL — ABNORMAL LOW (ref 30.0–36.0)
MCV: 95.1 fL (ref 80.0–100.0)
Platelets: 232 10*3/uL (ref 150–400)
RBC: 2.47 MIL/uL — ABNORMAL LOW (ref 4.22–5.81)
RDW: 15.5 % (ref 11.5–15.5)
WBC: 9 10*3/uL (ref 4.0–10.5)
nRBC: 0 % (ref 0.0–0.2)

## 2019-06-21 LAB — RENAL FUNCTION PANEL
Albumin: 2 g/dL — ABNORMAL LOW (ref 3.5–5.0)
Anion gap: 6 (ref 5–15)
BUN: 33 mg/dL — ABNORMAL HIGH (ref 8–23)
CO2: 27 mmol/L (ref 22–32)
Calcium: 7.9 mg/dL — ABNORMAL LOW (ref 8.9–10.3)
Chloride: 100 mmol/L (ref 98–111)
Creatinine, Ser: 0.86 mg/dL (ref 0.61–1.24)
GFR calc Af Amer: >60 mL/min (ref >60)
GFR calc non Af Amer: >60 mL/min (ref >60)
Glucose, Bld: 202 mg/dL — ABNORMAL HIGH (ref 70–99)
Phosphorus: 2.5 mg/dL (ref 2.5–4.6)
Potassium: 4.1 mmol/L (ref 3.5–5.1)
Sodium: 133 mmol/L — ABNORMAL LOW (ref 135–145)

## 2019-06-21 LAB — GLUCOSE, CAPILLARY
Glucose-Capillary: 132 mg/dL — ABNORMAL HIGH (ref 70–99)
Glucose-Capillary: 144 mg/dL — ABNORMAL HIGH (ref 70–99)
Glucose-Capillary: 212 mg/dL — ABNORMAL HIGH (ref 70–99)
Glucose-Capillary: 86 mg/dL (ref 70–99)

## 2019-06-21 LAB — COOXEMETRY PANEL
Carboxyhemoglobin: 1.4 % (ref 0.5–1.5)
Methemoglobin: 1.4 % (ref 0.0–1.5)
O2 Saturation: 55.5 %
Total hemoglobin: 10.5 g/dL — ABNORMAL LOW (ref 12.0–16.0)

## 2019-06-21 LAB — MAGNESIUM: Magnesium: 2.4 mg/dL (ref 1.7–2.4)

## 2019-06-21 MED ORDER — BISACODYL 10 MG RE SUPP: 10.0000 mg | Freq: Every day | RECTAL | Status: DC | PRN

## 2019-06-21 MED ORDER — POLYVINYL ALCOHOL 1.4 % OP SOLN
1.0000 [drp] | OPHTHALMIC | Status: DC | PRN
  Filled 2019-06-21: qty 15

## 2019-06-21 MED ORDER — MIDAZOLAM 50MG/50ML (1MG/ML) PREMIX INFUSION
0.5000 mg/h | INTRAVENOUS | Status: DC
  Administered 2019-06-21: 0.5 mg/h via INTRAVENOUS
  Filled 2019-06-21 (×2): qty 50

## 2019-06-21 MED ORDER — POLYVINYL ALCOHOL 1.4 % OP SOLN
1.0000 [drp] | Freq: Four times a day (QID) | OPHTHALMIC | Status: DC | PRN
  Filled 2019-06-21: qty 15

## 2019-06-21 MED ORDER — FENTANYL 2500MCG IN NS 250ML (10MCG/ML) PREMIX INFUSION
0.0000 ug/h | INTRAVENOUS | Status: DC
  Administered 2019-06-21: 25 ug/h via INTRAVENOUS
  Filled 2019-06-21: qty 250

## 2019-06-21 MED ORDER — ONDANSETRON 4 MG PO TBDP
4.0000 mg | ORAL_TABLET | Freq: Four times a day (QID) | ORAL | Status: DC | PRN
  Filled 2019-06-21: qty 1

## 2019-06-21 MED ORDER — GLYCOPYRROLATE 0.2 MG/ML IJ SOLN
0.4000 mg | INTRAMUSCULAR | Status: DC
  Administered 2019-06-21 (×2): 0.4 mg via INTRAVENOUS
  Filled 2019-06-21 (×2): qty 2

## 2019-06-21 MED ORDER — DIPHENHYDRAMINE HCL 50 MG/ML IJ SOLN
25.0000 mg | Freq: Three times a day (TID) | INTRAMUSCULAR | Status: DC | PRN
  Administered 2019-06-21: 25 mg via INTRAVENOUS
  Filled 2019-06-21: qty 1

## 2019-06-21 MED ORDER — ACETAMINOPHEN 650 MG RE SUPP: 650.0000 mg | Freq: Four times a day (QID) | RECTAL | Status: DC | PRN

## 2019-06-21 MED ORDER — ONDANSETRON HCL 4 MG/2ML IJ SOLN: 4.0000 mg | Freq: Four times a day (QID) | INTRAMUSCULAR | Status: DC | PRN

## 2019-06-21 MED ORDER — FENTANYL CITRATE (PF) 100 MCG/2ML IJ SOLN
50.0000 ug | INTRAMUSCULAR | Status: DC | PRN
  Administered 2019-06-21: 50 ug via INTRAVENOUS

## 2019-06-21 MED ORDER — BIOTENE DRY MOUTH MT LIQD: 15.0000 mL | OROMUCOSAL | Status: DC | PRN

## 2019-06-22 NOTE — Progress Notes (Signed)
100 mcg fentanyl wasted with Carlyn Reichert RN

## 2019-06-22 NOTE — Progress Notes (Signed)
Placed passed away 2019-07-07 @ 04/20/2157. No heart tones heard. Izetta Dakin and Cyd Silence listened for 1 minute each. Dr. Lucile Shutters with [facility] was notified. Family was informed .

## 2019-06-26 LAB — ACID FAST CULTURE WITH REFLEXED SENSITIVITIES (MYCOBACTERIA): Acid Fast Culture: NEGATIVE

## 2019-07-17 NOTE — Death Summary Note (Addendum)
Physician Discharge Summary       [address]       [place], [address]             [phone]    Patient ID: Brendan [name] MRN: [id number] DOB/AGE: Apr 22, 1955 64 y.o.  Admit date: 03/21/2019 Discharge date: 06/22/2019  Admission Diagnoses: 1. NSTEMI (non-ST elevated myocardial infarction) ([place]) 2. Severe mitral regurgitation 3. Coronary artery disease 4. Cardiogenic shock  Discharge Diagnoses:  1. S/p    CABG x 3 and mitral valve repair several days afterafter preoperative stabilization with Impella 5.5 LVAD for cardiogenic shock] 2. Expected post op blood loss anemia 3. AKI requiring hemodialysis 4. Acute on chronic systolic heart failure ([place]) 5. Protein-calorie malnutrition, severe 6. Pulmonary edema with congestive heart failure ([place]) 7. V tach arrest 04/15 8. Sever constipation 9. History of diabetes mellitus, not well controlled 10. History of hypertension 11. History of smokeless tobacco 12. History of erectile dysfunction   13. History of PONV (postoperative nausea and vomiting)  14. Acute on chronic respiratory failure requiring        tracheostomy  Consults: ID, nephrology and Heart failure  Procedure (s):  1. Coronary artery bypass grafting x3 (left internal mammary artery to left anterior descending, saphenous vein graft to posterior descending, saphenous vein graft to second diagonal).Preop placement of Impella 5.5 LVAD by Dr Prescott Gum 2. Mitral valve annuloplasty using a 26 mm Edwards Physio II annuloplasty ring, serial [id number]. 3. Endoscopic harvest of left leg greater saphenous vein by Dr. Prescott Gum on 05/13/2019.  4. Removal of Impella and bronchoscopy by Dr. Prescott Gum on 05/14/2019.  5. Placement of central venous catheter for HD by CCM on 04/10  6. Attempted right Pleur X catheter by Dr. Prescott Gum on 05/01/2019.  7. Bronchoscopy and tracheostomy by Dr. Prescott Gum on 04/30/2019  8. Right chest tube for pleural effusion  by Dr. Prescott Gum on 05/23/2019.  History of Presenting Illness: This is a 64 year old poorly controlled diabetic transferred from [facility] 4 days after admission for non-ST elevation MI associated with symptoms of heart failure and flash pulmonary edema. No previous history of heart disease. His troponins were 8.6 on presentation. His EKG showed inferior wall ischemic changes and echocardiogram showed EF 20% with at least moderate mitral regurgitation. RV function appears to be preserved. After transfer the patient underwent left and right heart cardiac catheterization showing severe three-vessel coronary disease with chronic occlusion of the circumflex and high-grade stenosis of the LAD system and RCA system. LVEDP was 35, CVP 25 and cardiac index 1.9-2.0. Patient was treated with diuretics with improved symptoms of shortness of breath.  Because of severe LV dysfunction and evidence of cardiogenic shock the patient was treated with central line and placed on heparin, milrinone and Lasix. He also required low-dose norepinephrine last night. He was started on amiodarone for multiple PVCs and short runs of V. tach with underlying junctional tachycardia.  He is scheduled for carotid Dopplers, ABI assessment, CT scan of the chest without contrast to assess pulmonary disease and atherosclerotic aortic disease.  Patient has significant neuropathy in his feet with difficulty with ambulation and history of falls. Hemoglobin A1c 9.7. Renal function and hepatic function appear to be fairly well preserved however the patient has severe low protein malnutrition with prealbumin of 8.2.  Patient became hypotensive and was started on Norepinephrine drip. He also developed respiratory distress so he was intubated. Because of cardiogenic shock with multisystem organ failure  and despite balloon pump, and Impella was placed on 04/02 as well as bilateral chest tubes for pleural effusions. His  hemodynamics improved, he was extubated on 04/04. He was felt stable to go to OR for coronary artery bypass grafting surgery and MVR on 04/06.   Brief Hospital Course:  The patient was weaned to be extubated the morning of 04/23/2019. He was on Amiodarone, Epinephrine, Milrinone, and Nor epinephrine drips and these were gradually weaned. He was also weaned off Nitric oxide. He initially required pacing. Impella was weaned and then removed on 04/09.  He had thrombocytopenia post op. He had a rash over the anterior chest wall and abdomen as well as a small area on right forearm;? Etiology possibly from [place] during prep for surgery. He was started on a Lasix drip to help improve volume status on 04/07. He had expected post op blood loss anemia and thrombocytopenia. He has severe malnutrition from chronic disease. Nutrition recommended Cortrak and tube feedings. He was hemodynamically stable to have Impella removed on 04/09. He required bi pap over this past weekend. Limited echo was done 04/10 showed LVEF 25-30%, no MR or MS, appears to be akinesis of the inferior and inferolateral walls. Nephrology was consulted as it was felt worsening renal status would require CVVHD. CCM placed a catheter for HD and CVVHD was started on 04/11. He had deteriorating mental status, increased WOB and required re intubation 04/12. Sputum culture showed Enterococcus faecalis and he was on Vancomycin and Cefepime. He was also on Fluconazole for Candida in the urine. He was then started on Ampicillin 04/16. His respiratory status improved and he was extubated on 04/14. Patient was placed on bi pap for worsening respiratory status. He then had a v tach arrest with ROSC after one round of ACLS. Pressors were titrated up secondary to hypotension, Amiodarone drip was started,  and he required re intubation. He underwent a bronchoscopy and tracheostomy on 04/16. He developed a fib on 04/17. He converted to sinus rhythm and was transitioned  from an Amiodarone drip to oral Amiodarone. As of 04/20, he was on Midodrine 15 mg tid, Milrinone drip 0.125 mcg/kg/min, and Vasopressin .03units/min drips.  Initially his respiratory status improved and he was mostly on the trach collar during the day and on the vent at night. Speech pathology was consulted regarding swallow study,PM valve. However, on 04/21 he had worsening hypoxia, WBC increased, and he had more infiltrates and edema on chest x ray. It was felt he may be aspirating and his antibiotics were changed to Zyvox and Zosyn. During this time, he was on the vent more than the trach collar. Nephrology continued to arrange for CVVHD and per them, CVVHD will not be able to be stopped until on much lower doses of pressor or off pressors. Echo was done again on 04/22. Results showed LVEF 30-35%, LV showed regional wall abnormalities (akinesis of basal to mid inferoseptal, inferior, and inferior lateral walls), RV mildly reduced, no significant MR or MS. His respiratory status has improved over the past week. He is tolerating trach collar during the day and vent at night. PT was consulted as patient was extremely deconditioned. He was transfused again on 04/25 as his H and H was 7.7 and 26.2 Patient was having complaints of total body pain on 04/26 . ESR was checked and was found to be elevated at 55. He was given Solu Medrol, which did increase his WBC.  He was eventually weaned off Vasopressin. He was intermittently  on Nor epinephrine drip (to keep SBP 100 or >) but weaned off all the other drips. Epicardial pacing wires were removed on 05/20/2019. He had hypoxia, became more lethargic and more difficult to arouse early on 05/07. ABG revealed respiratory acidosis. He was taken off trach collar and placed on vent. CCM changed this trach from #6 cuffless to cuffed. Dr. Prescott Gum placed a right chest tube for increasing right pleural effusion on 05/23/2019. He was put on Cefipime for one week.   H and H  decreased to 7.1 and 23.6 on 05/09 and he was transfused. He was on vent more than trach collar until 05/13. He was anemic again on 05/14 as H and H was 6.9 and 22.1. He was transfused. His co ox was decreased to 39 so Nor epinephrine increased on 05/14. He was put on a Dobutamine drip as co ox remained low. Patient had vomiting on 05/13.Tube feedings were stopped. KUB was obtained and showed a large amount of stool. He was given an enema.  After a bowel movement, tube feedings were slowly resumed. He was continued on Reglan for gastroparesis. He continued to be severely deconditioned and malnourished. He continued to receive transfusions for symptomatic anemia. He was weaned off vent and onto trach collar as tolerated. Speech pathology placed a post pyloric cortrak tube as existing cortrak stopped working. As discussed with family, full support was continued until June (grandson graduates from high school on 06/02). Family had a discussion with palliative care on 06/03 and per patient's wishes, patient made a DNR, he would like to see family, transition to comfort care, and stop pressor and CVVHD. Patient died 2019-07-11 at 2157-03-24.  Latest Vital Signs: Blood pressure 90/68, pulse (!) 36, temperature 97.6 F (36.4 C), temperature source Oral, resp. rate (!) 0, height _0  (1.803 m), weight 63.8 kg, SpO2 (!) 17 %.   Recent laboratory studies:  Lab Results  Component Value Date   WBC 9.0 11-Jul-2019   HGB 7.0 (L) 11-Jul-2019   HCT 23.5 (L) 2019/07/11   MCV 95.1 07-11-19   PLT 232 Jul 11, 2019   Lab Results  Component Value Date   NA 133 (L) July 11, 2019   K 4.1 07/11/19   CL 100 Jul 11, 2019   CO2 27 Jul 11, 2019   CREATININE 0.86 07-11-2019   GLUCOSE 202 (H) 07-11-2019      Diagnostic Studies: DG Chest 1 View  Result Date: 06/09/2019 CLINICAL DATA:  CHF EXAM: CHEST  1 VIEW COMPARISON:  06/07/2019 FINDINGS: Changes of CABG and valve replacement. Right chest tube remains in place. No  pneumothorax. Remainder support devices are stable. Severe diffuse bilateral airspace disease is slightly worsened since prior study. No visible effusions. IMPRESSION: Worsening severe diffuse bilateral airspace disease. Electronically Signed   By: Rolm Baptise M.D.   On: 06/09/2019 07:22   DG Abd 1 View  Result Date: 06/04/2019 CLINICAL DATA:  Feeding tube placement EXAM: ABDOMEN - 1 VIEW COMPARISON:  05/30/2019 FINDINGS: Soft feeding tube enters the stomach and passes into the duodenum with its tip at the junction of the third and fourth portions. IMPRESSION: Feeding tube tip in the duodenum at the junction of the third and fourth portions. Electronically Signed   By: Nelson Chimes M.D.   On: 06/04/2019 12:51   DG Abd 1 View  Result Date: 05/24/2019 CLINICAL DATA:  Enteric tube placement EXAM: ABDOMEN - 1 VIEW COMPARISON:  None. FINDINGS: Enteric tube tip is in the stomach. There is moderate stool in the colon.  There is no bowel dilatation or air-fluid level to suggest bowel obstruction. No free air. There are surgical clips in the right upper quadrant. IMPRESSION: Enteric tube tip in stomach. No bowel obstruction or free air. Moderate stool in colon. Electronically Signed   By: Lowella Grip III M.D.   On: 05/24/2019 08:13   DG Chest Port 1 View  Result Date: 06/17/2019 CLINICAL DATA:  Chest tube.  Tracheostomy tube. EXAM: PORTABLE CHEST 1 VIEW COMPARISON:  06/14/2019. FINDINGS: Tracheostomy tube, feeding tube, left subclavian dual lumen central line, right PICC line, and right chest tube in stable position. Prior CABG and cardiac valve replacement. Stable cardiomegaly. Diffuse bilateral interstitial infiltrates/edema again noted, slight improvement in aeration from prior exam. No pleural effusion or pneumothorax. Surgical clips noted over the right chest and upper abdomen. IMPRESSION: 1. Lines and tubes including right chest tube in stable position. No pneumothorax. 2.  Prior CABG and cardiac valve  replacement.  Stable cardiomegaly. 3. Diffuse bilateral interstitial infiltrates/edema again noted, slight improvement in aeration from prior exam. Electronically Signed   By: Marcello Moores  Register   On: 06/17/2019 06:06   DG CHEST PORT 1 VIEW  Result Date: 06/14/2019 CLINICAL DATA:  Chest tube placement EXAM: PORTABLE CHEST 1 VIEW COMPARISON:  06/12/2019 FINDINGS: Tracheostomy tube, central venous line, and feeding tube unchanged. RIGHT chest tube in place. There is bilateral diffuse airspace disease. No pneumothorax appreciated. IMPRESSION: 1. Stable support apparatus. 2. Diffuse bilateral dense airspace disease. 3. RIGHT chest tube in place without appreciable pneumothorax. Electronically Signed   By: Suzy Bouchard M.D.   On: 06/14/2019 09:59   DG Chest Port 1 View  Result Date: 06/12/2019 CLINICAL DATA:  Status post CABG EXAM: PORTABLE CHEST 1 VIEW COMPARISON:  06/11/2019 FINDINGS: Right chest tube in place without pneumothorax. Tracheostomy tube tip is above the carina. Large bore central venous catheter is identified with tip at the cavoatrial junction. There is a right arm PICC line with tip also at the cavoatrial junction. Feeding tube is in place. Stable cardiomediastinal silhouette. Diffuse bilateral airspace densities appear unchanged from the previous exam. IMPRESSION: 1. Stable support apparatus.  Right chest tube without pneumothorax. 2. No change in aeration to the lungs compared with previous exam. Electronically Signed   By: Kerby Moors M.D.   On: 06/12/2019 08:41   DG Chest Port 1 View  Result Date: 06/11/2019 CLINICAL DATA:  Tracheostomy, chest tube, respiratory failure, open-heart surgery. EXAM: PORTABLE CHEST 1 VIEW COMPARISON:  06/09/2019 and CT chest 05/23/2019. FINDINGS: Tracheostomy is midline. Feeding tube is followed into the stomach with the tip projecting beyond the inferior margin of the image. Right PICC and left IJ dialysis catheter tips are in the right atrium. Right  chest tube terminates at the apex of the right hemithorax. Seven intact sternotomy wires. Cardiomediastinal silhouette is unchanged. Diffuse mixed interstitial and airspace opacification, minimally improved in the left upper lobe. Focal opacities in the right mid and lower lung zones, similar. Left costophrenic angle is not included on the image. There may be fluid in the right costophrenic angle. No definite pneumothorax. IMPRESSION: 1. Diffuse bilateral mixed interstitial and airspace opacification with slight improvement in the left upper lobe. Findings may be due to pulmonary edema or acute respiratory distress syndrome in the appropriate clinical setting. 2. Probable right pleural effusion. 3. No definite pneumothorax with right chest tube in place. Electronically Signed   By: Lorin Picket M.D.   On: 06/11/2019 09:22   DG Chest [facility] [address]  Result Date: 06/07/2019 CLINICAL DATA:  Mitral valve repair EXAM: PORTABLE CHEST 1 VIEW COMPARISON:  06/04/2019 FINDINGS: Cardiac shadow is stable. Postsurgical changes are again seen. Tracheostomy tube, feeding catheter and left-sided dialysis catheter are again noted and stable. Right-sided PICC line and right thoracostomy tube are noted. No pneumothorax is seen. Stable opacities are noted throughout both lungs. No bony abnormality is noted. IMPRESSION: Stable appearance of the chest when compared with the prior exam. Electronically Signed   By: Inez Catalina M.D.   On: 06/07/2019 08:41   DG Chest Port 1 View  Result Date: 06/04/2019 CLINICAL DATA:  CABG/tracheostomy. EXAM: PORTABLE CHEST 1 VIEW COMPARISON:  06/03/2019 FINDINGS: Tracheostomy tube unchanged. Enteric tube courses into the stomach and off the film as tip is not visualized. Right-sided PICC line has been pulled back slightly and has tip at the cavoatrial junction. Left IJ dialysis catheter with tip just below the cavoatrial junction. Right-sided chest tube unchanged. Lungs are adequately inflated  demonstrate persistent hazy bilateral airspace process with perihilar predominance suggesting interstitial edema although multifocal infection is possible. Possible small amount of right pleural fluid. Cardiomediastinal silhouette and remainder of the exam is unchanged. IMPRESSION: 1. Persistent bilateral hazy airspace process with perihilar predominance suggesting interstitial edema although multifocal infection is possible. Possible small amount right pleural fluid. 2.  Tubes and lines as described. Electronically Signed   By: Marin Olp M.D.   On: 06/04/2019 07:32   DG Chest Port 1 View  Result Date: 06/03/2019 CLINICAL DATA:  History of recent open heart surgery with persistent shortness of breath. EXAM: PORTABLE CHEST 1 VIEW COMPARISON:  06/01/2019 FINDINGS: The tracheostomy tube is stable. The feeding tube is stable. The right PICC line and left IJ central venous catheters are stable. Stable right-sided chest tube without definite right-sided pneumothorax. Persistent diffuse interstitial and airspace process in the lungs. No large pleural effusions. IMPRESSION: 1. Stable support apparatus. 2. Persistent diffuse interstitial and airspace process. Electronically Signed   By: Marijo Sanes M.D.   On: 06/03/2019 07:58   DG Chest Port 1 View  Result Date: 06/01/2019 CLINICAL DATA:  Heart failure, cardiogenic shock, respiratory failure. EXAM: PORTABLE CHEST 1 VIEW COMPARISON:  Chest x-rays dated 05/30/2019 and 05/29/2019. FINDINGS: Stable cardiomegaly. Tubes and lines are stable in position, including a RIGHT-sided chest tube. No pleural effusion or pneumothorax is seen. Diffuse bilateral airspace opacities are unchanged. IMPRESSION: 1. Stable chest x-ray. 2. Stable diffuse bilateral airspace opacities, pulmonary edema versus multifocal pneumonia. Electronically Signed   By: Franki Cabot M.D.   On: 06/01/2019 09:37   DG Chest Port 1 View  Result Date: 05/30/2019 CLINICAL DATA:  Status post cardiac  surgery. EXAM: PORTABLE CHEST 1 VIEW COMPARISON:  Chest x-ray from yesterday. FINDINGS: Unchanged tracheostomy and feeding tubes. Unchanged tunneled left internal jugular dialysis catheter. Unchanged right upper extremity PICC line and right chest tube. Stable cardiomegaly status post CABG and mitral valve repair. Diffuse interstitial and patchy airspace opacities have mildly worsened in the left upper lobe and right lung base. No pneumothorax or pleural effusion. No acute osseous abnormality. IMPRESSION: 1. Extensive bilateral interstitial and airspace disease, worsened in the left upper lobe and right lung base. Electronically Signed   By: Titus Dubin M.D.   On: 05/30/2019 07:48   DG Chest Port 1 View  Result Date: 05/29/2019 CLINICAL DATA:  Recent CABG. EXAM: PORTABLE CHEST 1 VIEW COMPARISON:  Chest x-ray from yesterday. FINDINGS: Unchanged tracheostomy and feeding tubes. Unchanged tunneled left internal jugular dialysis  catheter. Unchanged right upper extremity PICC line and right chest tube. Stable cardiomediastinal silhouette status post CABG and mitral valve repair. Diffuse interstitial and scattered patchy airspace opacities are not significantly changed. No pneumothorax or pleural effusion. No acute osseous abnormality. IMPRESSION: 1. Unchanged bilateral interstitial and airspace disease. Electronically Signed   By: Titus Dubin M.D.   On: 05/29/2019 08:29   DG Chest Port 1 View  Result Date: 05/28/2019 CLINICAL DATA:  CABG. EXAM: PORTABLE CHEST 1 VIEW COMPARISON:  Fist x-ray from yesterday. FINDINGS: Unchanged tracheostomy and feeding tubes. Unchanged tunneled left internal jugular dialysis catheter and right upper extremity PICC line. Unchanged right chest tube. Stable cardiomediastinal silhouette status post CABG and mitral valve repair. Diffuse interstitial and scattered patchy airspace opacities are similar to prior study. No pneumothorax or large pleural effusion. No acute osseous  abnormality. IMPRESSION: 1. Unchanged bilateral interstitial and airspace disease. Electronically Signed   By: Titus Dubin M.D.   On: 05/28/2019 07:47   DG Chest Port 1 View  Result Date: 05/27/2019 CLINICAL DATA:  Mitral valve replacement EXAM: PORTABLE CHEST 1 VIEW COMPARISON:  Yesterday FINDINGS: Tracheostomy tube in place. Feeding tube that at least reaches the stomach. Right central line and left-sided dialysis catheter. Right-sided PICC. This hardware is in stable unremarkable position. CABG and mitral valve repair. Diffuse interstitial opacity with improvement suggesting edema. Small pleural effusions may be present. No visible pneumothorax IMPRESSION: 1. Stable hardware positioning. 2. Improved edema. Electronically Signed   By: Monte Fantasia M.D.   On: 05/27/2019 07:04   DG Chest Port 1 View  Result Date: 05/26/2019 CLINICAL DATA:  Cardiogenic shock EXAM: PORTABLE CHEST 1 VIEW COMPARISON:  May 24, 2019. FINDINGS: Tracheostomy catheter tip is 6.5 cm above the carina. Central catheter tip is at the cavoatrial junction. There is a feeding tube with tip below the diaphragm. Chest tube present on the right, unchanged in position. No evident pneumothorax. Widespread interstitial and alveolar opacities are again noted with increase in consolidation throughout the right mid and lower lung zones. Left lung appears essentially stable. There is cardiomegaly with pulmonary venous hypertension. Patient is status post coronary artery bypass grafting and mitral valve replacement. No adenopathy evident. No bone lesions. IMPRESSION: Tube and catheter positions as described without pneumothorax. Extensive interstitial and airspace opacity bilaterally with increase in airspace opacity/consolidation in the right mid and lower lung zones compared to 2 days prior. Left lung unchanged. Suspect widespread pulmonary edema with potential developing pneumonia on the right. Stable cardiac prominence. Postoperative changes  noted. Electronically Signed   By: Lowella Grip III M.D.   On: 05/26/2019 08:10   DG CHEST PORT 1 VIEW  Result Date: 05/24/2019 CLINICAL DATA:  Respiratory failure/hypoxia EXAM: PORTABLE CHEST 1 VIEW COMPARISON:  May 23, 2019 FINDINGS: Tracheostomy catheter tip is 6.9 cm above the carina. Left central catheter tip is in the right atrium. Right central catheter tip is in the superior vena cava. Feeding tube tip is below the diaphragm. There is a chest tube on the right. There is no demonstrable pneumothorax. Areas of interstitial and alveolar opacity, presumably edema, remain without appreciable change from 1 day prior. Cardiomegaly with pulmonary venous hypertension appear stable. Status post coronary artery bypass grafting and mitral valve replacement. No evident adenopathy. Surgical clips in the right axillary region remain. IMPRESSION: Essentially stable appearance compared to 1 day prior. Tube and catheter positions as described without evident pneumothorax. Interstitial and alveolar opacity present, likely representing edema. A degree of superimposed pneumonia cannot  be excluded. Stable cardiac prominence. Electronically Signed   By: Lowella Grip III M.D.   On: 05/24/2019 08:12   DG Chest Port 1 View  Result Date: 05/23/2019 CLINICAL DATA:  Chest tube placement. EXAM: PORTABLE CHEST 1 VIEW COMPARISON:  05/23/2019 FINDINGS: Tracheostomy tube in satisfactory position. Feeding tube coursing below the diaphragm. Dual lumen left-sided central venous catheter with the tip projecting over the cavoatrial junction. Right-sided chest tube directed towards the apex. No pneumothorax. Trace bilateral pleural effusions. Bilateral diffuse interstitial and alveolar airspace opacities somewhat improved compared with x-ray performed earlier same day. Stable cardiac mediastinal silhouette. Prior CABG. No acute osseous abnormality. IMPRESSION: 1. Right-sided chest tube directed towards the apex. No pneumothorax. 2.  Bilateral diffuse interstitial and alveolar airspace opacities somewhat improved compared with x-ray performed earlier same day. Differential considerations include pulmonary edema versus less likely multilobar pneumonia given the interval improvement within the same day. Electronically Signed   By: Kathreen Devoid   On: 05/23/2019 17:01   DG Abd Portable 1V  Result Date: 05/30/2019 CLINICAL DATA:  Constipation. EXAM: PORTABLE ABDOMEN - 1 VIEW COMPARISON:  Abdominal x-ray dated May 24, 2019. FINDINGS: Feeding tube tip near the pylorus. Progressive moderate to large colonic stool burden with new mild distention of the transverse colon. Prominent nondilated air-filled loops of small bowel in the left lower quadrant suggestive of ileus. No radio-opaque calculi or other significant radiographic abnormality are seen. Prior cholecystectomy. No acute osseous abnormality. Unchanged bibasilar airspace disease. IMPRESSION: Progressive moderate to large colonic stool burden with increased mild distention of the transverse colon. Electronically Signed   By: Titus Dubin M.D.   On: 05/30/2019 10:17   ECHOCARDIOGRAM LIMITED  Result Date: 05/29/2019    ECHOCARDIOGRAM LIMITED REPORT   Patient Name:   KOJI NIEHOFF Date of Exam: 05/29/2019 Medical Rec #:  [id number]        Height:       71.0 in Accession #:    [id number]       Weight:       125.2 lb Date of Birth:  [birth year]        BSA:          1.728 m Patient Age:    70 years         BP:           101/40 mmHg Patient Gender: M                HR:           73 bpm. Exam Location:  Inpatient Procedure: Limited Echo, Limited Color Doppler and Cardiac Doppler Indications:    ischemic cardiomyopathy 414.8  History:        Patient has prior history of Echocardiogram examinations, most                 recent 05/08/2019. Prior CABG.                  Mitral Valve: valve is present in the mitral position.  Sonographer:    Johny Chess Referring Phys: [place]  1. Left ventricular ejection fraction, by estimation, is 35 to 40%. The left ventricle has moderately decreased function. The left ventricle demonstrates regional wall motion abnormalities (see scoring diagram/findings for description). Left ventricular  diastolic function could not be evaluated.  2. The right ventricular size is normal.  3. The mitral valve has been repaired/replaced. No evidence of mitral valve regurgitation. No evidence  of mitral stenosis. There is a present in the mitral position.  4. The aortic valve is tricuspid. Aortic valve regurgitation is not visualized. Mild aortic valve sclerosis is present, with no evidence of aortic valve stenosis.  5. The inferior vena cava is normal in size with <50% respiratory variability, suggesting right atrial pressure of 8 mmHg. FINDINGS  Left Ventricle: Left ventricular ejection fraction, by estimation, is 35 to 40%. The left ventricle has moderately decreased function. The left ventricle demonstrates regional wall motion abnormalities. There is no left ventricular hypertrophy. Right Ventricle: The right ventricular size is normal. Left Atrium: Left atrial size was not well visualized. Right Atrium: Right atrial size was not well visualized. Pericardium: Trivial pericardial effusion is present. Mitral Valve: The mitral valve has been repaired/replaced. There is moderate thickening of the mitral valve leaflet(s). There is moderate calcification of the mitral valve leaflet(s). There is a present in the mitral position. No evidence of mitral valve  stenosis. Tricuspid Valve: The tricuspid valve is normal in structure. Tricuspid valve regurgitation is trivial. No evidence of tricuspid stenosis. Aortic Valve: The aortic valve is tricuspid. . There is mild thickening and mild calcification of the aortic valve. Aortic valve regurgitation is not visualized. Mild aortic valve sclerosis is present, with no evidence of aortic valve stenosis. There is mild  thickening of the aortic valve. There is mild calcification of the aortic valve. Pulmonic Valve: The pulmonic valve was grossly normal. Pulmonic valve regurgitation is trivial. No evidence of pulmonic stenosis. Aorta: The aortic root was not well visualized. Venous: The inferior vena cava is normal in size with less than 50% respiratory variability, suggesting right atrial pressure of 8 mmHg. IAS/Shunts: The interatrial septum was not assessed.  LEFT VENTRICLE PLAX 2D LVIDd:         5.50 cm LVIDs:         4.50 cm LV PW:         1.00 cm LV IVS:        1.00 cm LVOT diam:     1.80 cm LV SV:         45 LV SV Index:   26 LVOT Area:     2.54 cm  LV Volumes (MOD) LV vol d, MOD A2C: 72.8 ml LV vol s, MOD A2C: 46.3 ml LV SV MOD A2C:     26.5 ml LEFT ATRIUM         Index LA diam:    3.90 cm 2.26 cm/m  AORTIC VALVE LVOT Vmax:   85.50 cm/s LVOT Vmean:  53.500 cm/s LVOT VTI:    0.178 m  AORTA Ao Root diam: 3.30 cm MITRAL VALVE MV Area (PHT): 3.65 cm     SHUNTS MV Decel Time: 208 msec     Systemic VTI:  0.18 m MV E velocity: 110.00 cm/s  Systemic Diam: 1.80 cm MV A velocity: 81.20 cm/s MV E/A ratio:  1.35 Buford Dresser MD Electronically signed by Buford Dresser MD Signature Date/Time: 05/29/2019/5:23:42 PM    Final      Discharge Medications: Allergies as of 06/22/2019      Reactions   Acetaminophen Itching   Chlorhexidine Other (See Comments)   Per family, CHG wipes caused skin irritation and scabs      Medication List    STOP taking these medications   Basaglar KwikPen 100 UNIT/ML Replaced by: insulin glargine 100 UNIT/ML injection   insulin lispro 100 UNIT/ML injection Commonly known as: HUMALOG   metFORMIN 500 MG tablet Commonly  known as: GLUCOPHAGE     TAKE these medications   aspirin 81 MG chewable tablet Place 4 tablets (324 mg total) into feeding tube daily.   B-complex with vitamin C tablet Place 1 tablet into feeding tube daily.   feeding supplement (NEPRO CARB STEADY)  Liqd Place 1,000 mLs into feeding tube continuous.   feeding supplement (PRO-STAT SUGAR FREE 64) Liqd Take 30 mLs by mouth 2 (two) times daily.   heparin 10,000 Units in sodium chloride 0.9 % 18 mL 1,000 Units/hr by CRRT route continuous.   insulin aspart 100 UNIT/ML injection Commonly known as: novoLOG 0-24 units [place] every 4 hours (dose in units): CBG<70 use hypoglycemic protocol CBG<120=0       CBG 201-250=8 CBG121-160=2  CBG 251-300=12 CBG 161-200=4  CBG 301-350=16 CBG>450, 24 units STAT and call MD   insulin glargine 100 UNIT/ML injection Commonly known as: LANTUS Inject 0.16 mLs (16 Units total) into the skin 2 (two) times daily. Replaces: Basaglar KwikPen 100 UNIT/ML   levalbuterol 0.63 MG/3ML nebulizer solution Commonly known as: XOPENEX Take 3 mLs (0.63 mg total) by nebulization every 6 (six) hours as needed for wheezing or shortness of breath.   midodrine 5 MG tablet Commonly known as: PROAMATINE Place 3 tablets (15 mg total) into feeding tube every 8 (eight) hours.   mouth rinse Liqd solution 15 mLs by Mouth Rinse route in the morning, at noon, and at bedtime.   pantoprazole sodium 40 mg/20 mL Pack Commonly known as: PROTONIX Place 20 mLs (40 mg total) into feeding tube 2 (two) times daily.   Pen Needles 32G X 4 MM Misc   RELION GLUCOSE TEST STRIPS test strip Generic drug: glucose blood Use as instructed   ReliOn Ultra Thin Lancets Misc Use to test blood sugar up to 4 times daily.   sevelamer carbonate 0.8 g Pack packet Commonly known as: RENVELA Place 0.8 g into feeding tube 3 (three) times daily with meals.        SignedSharalyn Ink ZimmermanPA-C 06/22/2019, 2:11 PM   patient examined and medical record reviewed,agree with above note. [name] 06/27/2019

## 2019-07-17 NOTE — Progress Notes (Signed)
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 Patient presented to outside hospital on 3/26 and transferred to [facility] for further evaluation due to elevated troponin and newly depressed EF. During his initial work-up patient was found to have a blood sugar of 516, BUN53, creatinine 1.2, and anion gap of 17. Patient's BNP was elevated to 8900. Troponin was elevated to 13.1 with changes noted by the team of their lateral leads as well as V1 and V2.Chest x-ray showed concerns for bilateral pulmonary opacities. Patient was initiated on aspirin, heparin drip, and IV antibiotics. Recent echo showed EF of 25%. Since admission patient required intubation on 4/1, extubated on 4/4, re-intubated 4/6 for CABG 4/6, extubated post procedure 4/7, CRRT initiated on 4/10with brief transition to iHD and CRRT began again 5/7, required re-intubation on 4/12 due to respiratory failure, tracheostomy performed on 4/16, 5/7 right chest tube placed due to enlarging effusion. Continues to require midodrine and pressorsas well as ventilator support.CRRT holiday with trial of iHD planned for 5/25. Failed trial of iHD  5/25 due to hypotension and CRRT restarted 5/26.  06/18/2019 - Patient was able to attend his grandsons graduation ceremony last night.   06/19/2019 - [place] discussions with patient. Plan for transition to comfort measures.  06/20/2019 - There are additional family members that would like to visit before comfort is pursued. Plan for transition tomorrow afternoon.  Today's Discussion (2019-07-08): I met this afternoon with Joneen Caraway and Cecille Rubin, they were joined by family at bedside (Russells two brothers and sister). I asked them if there are any additional questions that I may answer at this time. They stated that  there were not.   We discussed transition to comfort oriented care again reviewing that we will stop CRRT, pressor support, and mechanical ventilation. We will add a fentanyl and midazolam drips to help with symptoms.  Patient and his family are in agreement.   Comfort orders written, patients RN Judson Roch is aware and will institute them.   Patients primary medical team aware of plan.   Recommendations and Plan: DNR  Transition to comfort measures  PMT will continue to offer ongoing support during this immensely difficult time  Appreciate Chaplain involvement  Symptom Management as below  Symptom Management: Dyspnea: Pain:  - Fentanyl gtt with boluses Fever:  - Tylenol 643m  PR Q6H PRN Itching:  - Benadryl 225mIV Q8H PRN Agitation: Anxiety:  - Midazolan gtt Nausea:  - Zofran 16m86mO/IV Q6H PRN  Secretions:  - Glycopyrrolate 0.16mg61m Q4H ATC Dry Eyes:  - Artificial Tears PRN Xerostomia:  - Biotene 15ml48m  - BID oral care Urinary Retention:  - Maintain foley  Constipation:  - Bisacodyl 10mg 13mRN QDay Spiritual:  - Chaplain consult  Time Spent:  35 Greater than 50% of the time was spent in counseling and coordination of care ______________________________________________________________________________________ [facility] Cell Phone: [phone] utilize secure chat with additional questions, if there is no response within 30 minutes please call the above phone number  Palliative Medicine Team providers are available by phone from 7am to 7pm daily and can be reached through the team cell phone.  Should this patient require assistance outside of these hours, please call the patient's attending physician.

## 2019-07-17 NOTE — Progress Notes (Signed)
50 Days Post-Op Procedure(s) (LRB): VIDEO BRONCHOSCOPY USING DISPOSABLE ANESTHESIA SCOPE (N/A) TRACHEOSTOMY (N/A) Subjective: The patient is in the process of withdrawal of life support with transition to comfort care.  Palliative care has care excellent care for the patient and family during this difficult process.  I have made contact with the patient's family and offered my condolences.  Objective: Vital signs in last 24 hours: Temp:  [97.6 F (36.4 C)-97.8 F (36.6 C)] 97.6 F (36.4 C) (06/05 1137) Pulse Rate:  [76-83] 81 (06/05 1527) Cardiac Rhythm: Normal sinus rhythm (06/05 0800) Resp:  [15-26] 16 (06/05 1527) BP: (68-117)/(36-71) 90/65 (06/05 1527) SpO2:  [94 %-100 %] 99 % (06/05 1527) FiO2 (%):  [40 %] 40 % (06/05 1527) Weight:  [63.8 kg] 63.8 kg (06/05 0530)  Hemodynamic parameters for last 24 hours: CVP:  [10 mmHg-16 mmHg] 11 mmHg  Intake/Output from previous day: 06/04 0701 - 06/05 0700 In: 1858.9 [P.O.:90; I.V.:194.4; NG/GT:1320; IV Piggyback:254.5] Out: 2766 [Emesis/NG output:240] Intake/Output this shift: Total I/O In: 340.6 [I.V.:40.6; NG/GT:300] Out: 514 [Other:514]  Patient appears comfortable  Lab Results: Recent Labs    06/20/19 0355 June 28, 2019 0416  WBC 9.5 9.0  HGB 7.2* 7.0*  HCT 23.4* 23.5*  PLT 226 232   BMET:  Recent Labs    06/20/19 1730 June 28, 2019 0416  NA 133* 133*  K 4.1 4.1  CL 94* 100  CO2 26 27  GLUCOSE 212* 202*  BUN 35* 33*  CREATININE 0.91 0.86  CALCIUM 8.2* 7.9*    PT/INR: No results for input(s): LABPROT, INR in the last 72 hours. ABG    Component Value Date/Time   PHART 7.408 06/13/2019 1134   HCO3 30.7 (H) 06/13/2019 1134   TCO2 32 06/13/2019 1134   ACIDBASEDEF 3.0 (H) 04/28/2019 0735   O2SAT 55.5 06/28/2019 0416   CBG (last 3)  Recent Labs    06/28/19 0425 2019-06-28 0810 2019-06-28 1134  GLUCAP 212* 86 132*    Assessment/Plan: S/P Procedure(s) (LRB): VIDEO BRONCHOSCOPY USING DISPOSABLE ANESTHESIA SCOPE  (N/A) TRACHEOSTOMY (N/A) Comfort care and withdrawal of support initiated today   LOS: 68 days    Brendan [name] 2019/06/28

## 2019-07-17 NOTE — Progress Notes (Signed)
Brief nephrology note: Chart reviewed including palliative care note.  Noted the plan is to discontinue CRRT and then comfort measures when family members arrive today.  Please call back with question. Discussed with ICU nurse

## 2019-07-17 NOTE — Progress Notes (Signed)
Pt removed from vent and placed on 28% ATC per comfort care measures. RN at bedside.

## 2019-07-17 DEATH — deceased

## 2020-06-21 IMAGING — DX DG ABDOMEN 1V
1 series · 1 of 1 positions shown · non-contrast
Comparison: None.

CLINICAL DATA: Enteric tube placement

EXAM:
ABDOMEN - 1 VIEW

[abdomen kub]
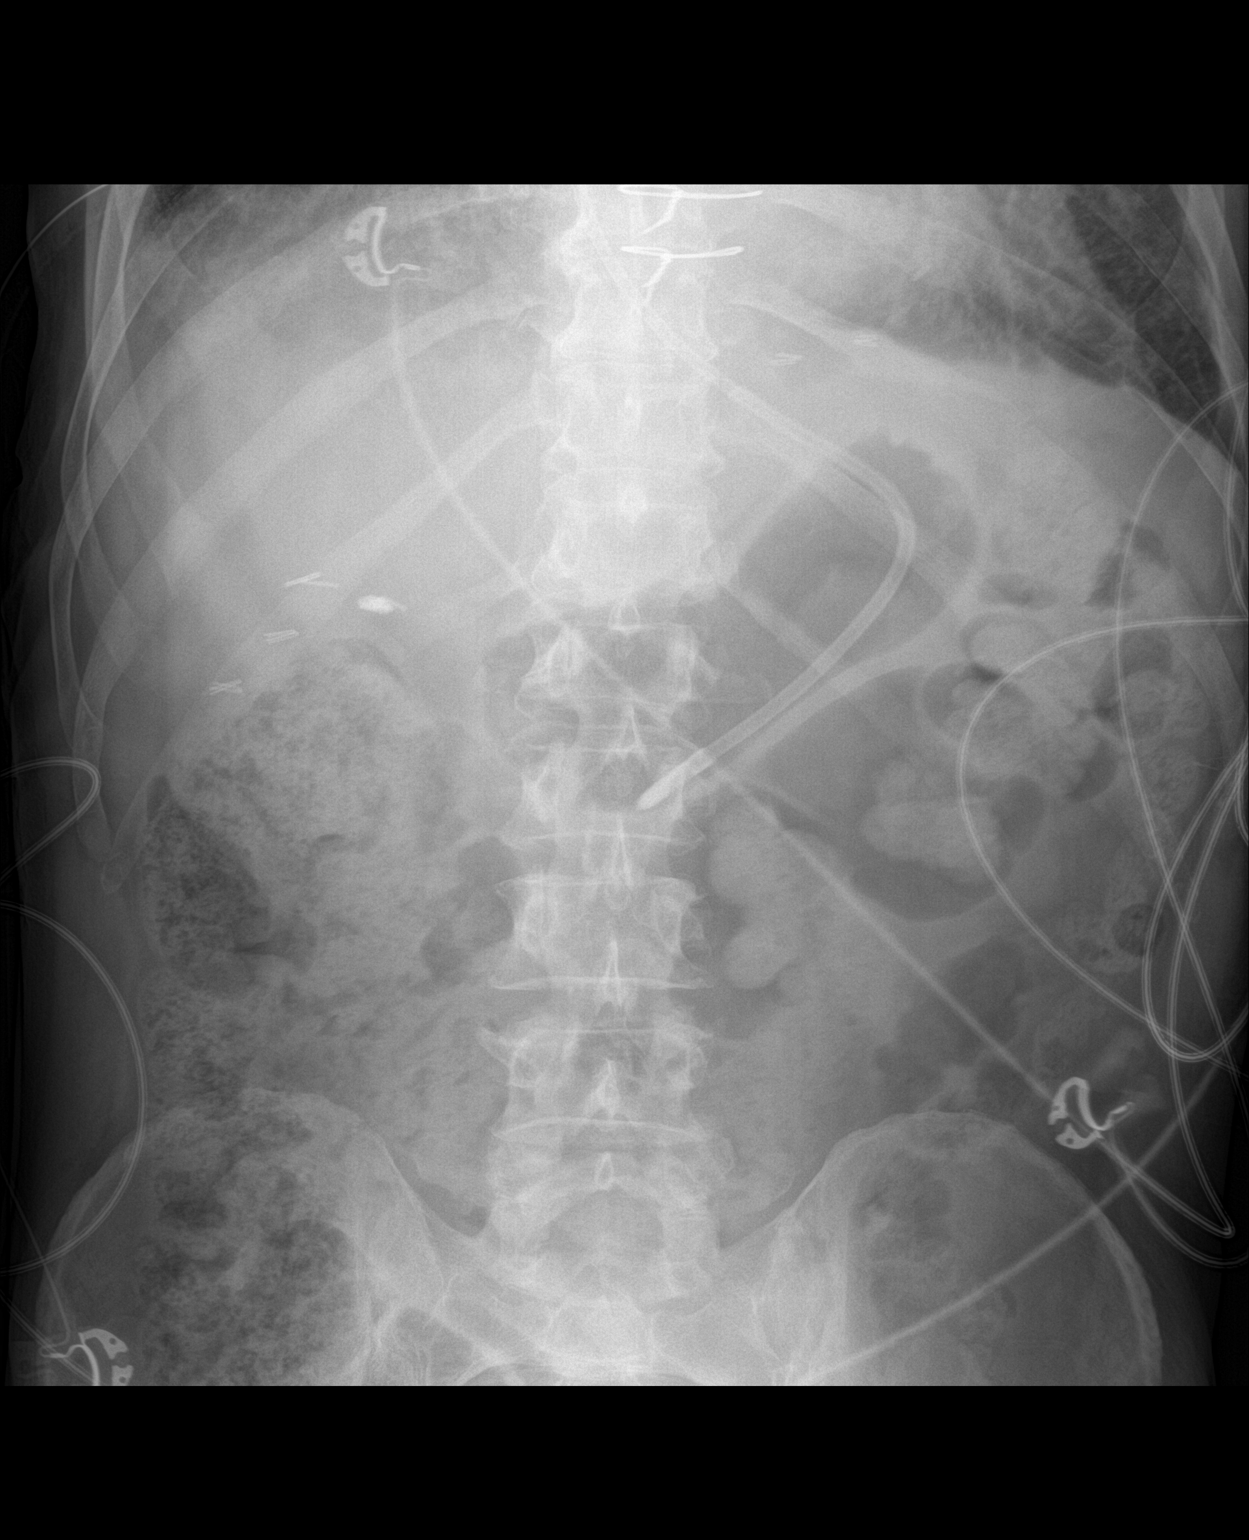

[1 of 1 positions shown; findings below may reference images not displayed]

FINDINGS: Enteric tube tip is in the stomach. There is moderate stool in the
colon. There is no bowel dilatation or air-fluid level to suggest
bowel obstruction. No free air. There are surgical clips in the
right upper quadrant.
IMPRESSION: Enteric tube tip in stomach. No bowel obstruction or free air.
Moderate stool in colon.

## 2020-06-23 IMAGING — CR DG CHEST 1V PORT
1 series · 1 of 1 positions shown · non-contrast
Comparison: May 24, 2019.

CLINICAL DATA: Cardiogenic shock

EXAM:
PORTABLE CHEST 1 VIEW

[AP]
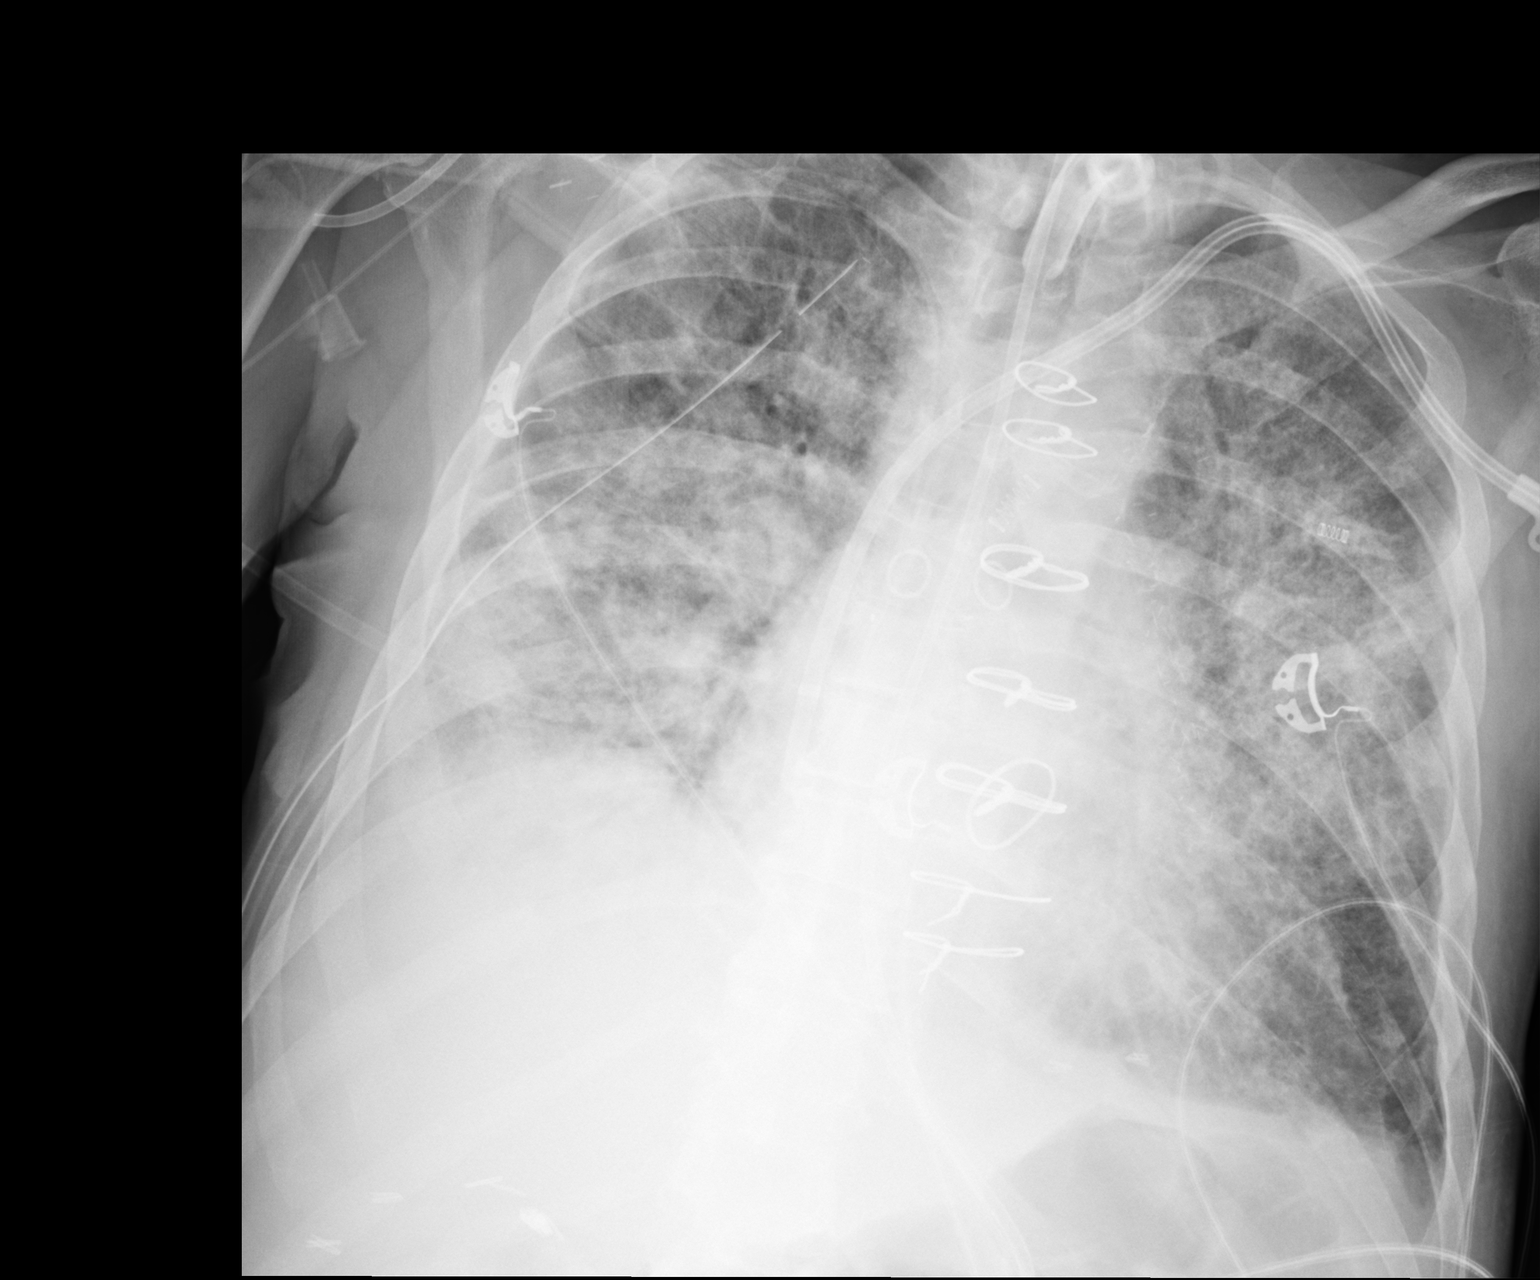

[1 of 1 positions shown; findings below may reference images not displayed]

FINDINGS: Tracheostomy catheter tip is 6.5 cm above the carina. Central
catheter tip is at the cavoatrial junction. There is a feeding tube
with tip below the diaphragm. Chest tube present on the right,
unchanged in position. No evident pneumothorax.

Widespread interstitial and alveolar opacities are again noted with
increase in consolidation throughout the right mid and lower lung
zones. Left lung appears essentially stable. There is cardiomegaly
with pulmonary venous hypertension. Patient is status post coronary
artery bypass grafting and mitral valve replacement. No adenopathy
evident. No bone lesions.
IMPRESSION: Tube and catheter positions as described without pneumothorax.
Extensive interstitial and airspace opacity bilaterally with
increase in airspace opacity/consolidation in the right mid and
lower lung zones compared to 2 days prior. Left lung unchanged.
Suspect widespread pulmonary edema with potential developing
pneumonia on the right. Stable cardiac prominence. Postoperative
changes noted.

## 2020-06-24 IMAGING — DX DG CHEST 1V PORT
1 series · 1 of 1 positions shown · non-contrast
Comparison: Yesterday

CLINICAL DATA: Mitral valve replacement

EXAM:
PORTABLE CHEST 1 VIEW

[chest ap]
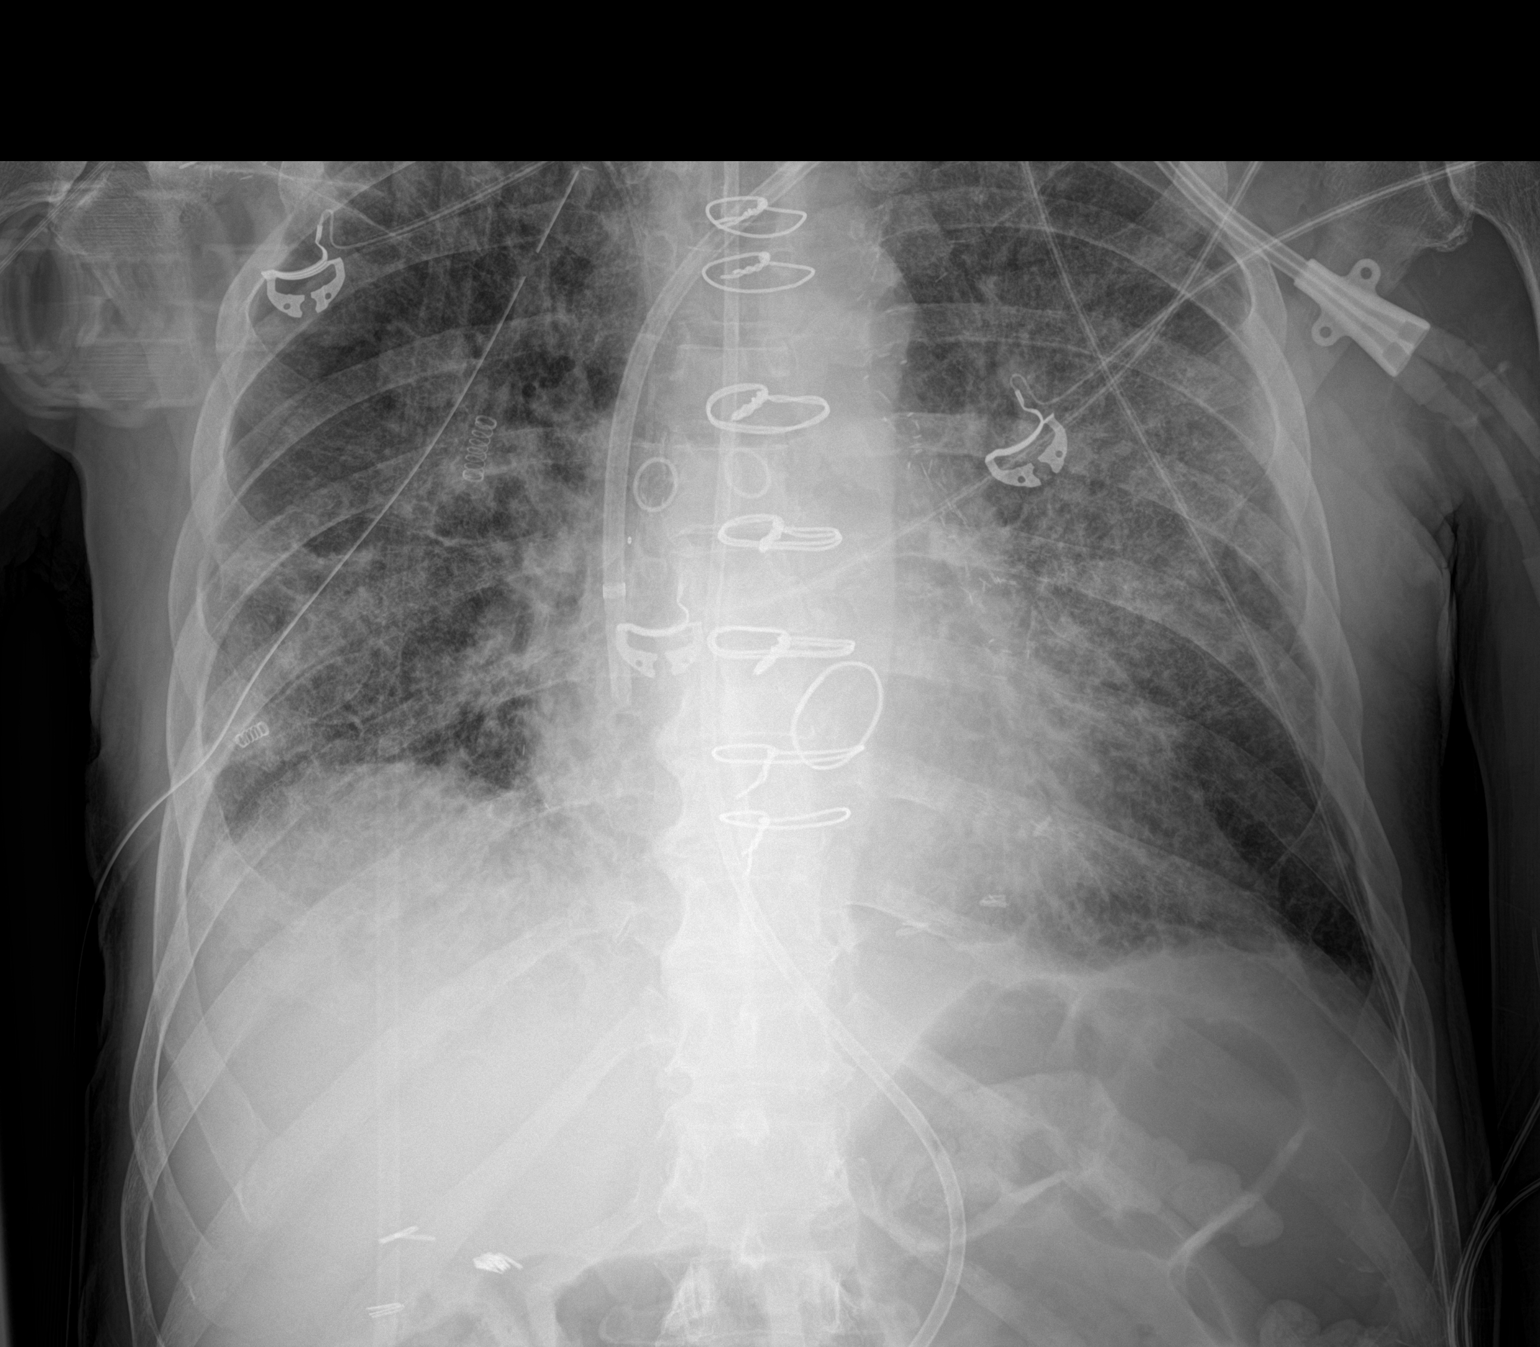

[1 of 1 positions shown; findings below may reference images not displayed]

FINDINGS: Tracheostomy tube in place. Feeding tube that at least reaches the
stomach. Right central line and left-sided dialysis catheter.
Right-sided PICC. This hardware is in stable unremarkable position.

CABG and mitral valve repair.

Diffuse interstitial opacity with improvement suggesting edema.
Small pleural effusions may be present. No visible pneumothorax
IMPRESSION: 1. Stable hardware positioning.
2. Improved edema.

## 2020-06-27 IMAGING — DX DG CHEST 1V PORT
1 series · 1 of 1 positions shown · non-contrast
Comparison: Chest x-ray from yesterday.

CLINICAL DATA: Status post cardiac surgery.

EXAM:
PORTABLE CHEST 1 VIEW

[chest]
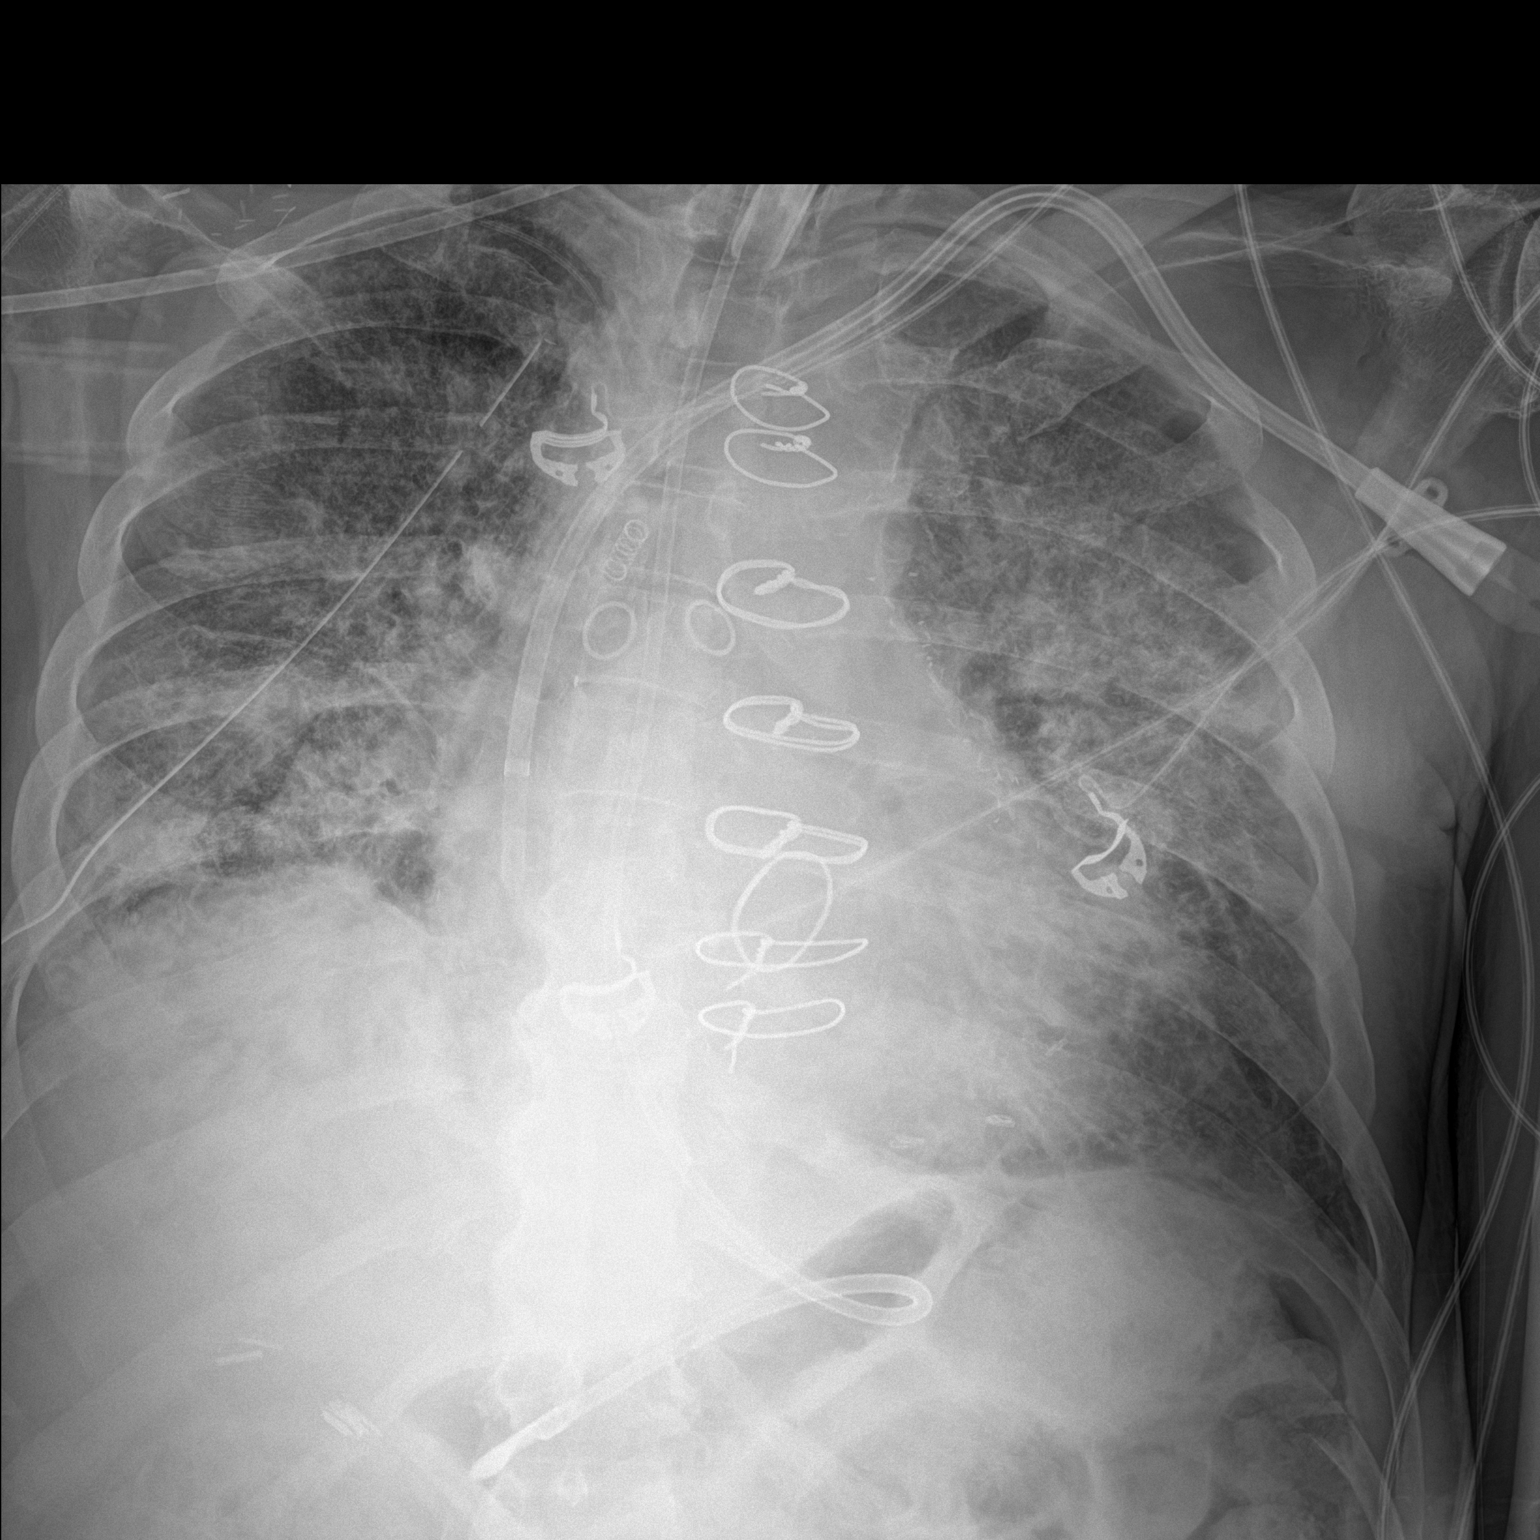

[1 of 1 positions shown; findings below may reference images not displayed]

FINDINGS: Unchanged tracheostomy and feeding tubes. Unchanged tunneled left
internal jugular dialysis catheter. Unchanged right upper extremity
PICC line and right chest tube.

Stable cardiomegaly status post CABG and mitral valve repair.
Diffuse interstitial and patchy airspace opacities have mildly
worsened in the left upper lobe and right lung base. No pneumothorax
or pleural effusion. No acute osseous abnormality.
IMPRESSION: 1. Extensive bilateral interstitial and airspace disease, worsened
in the left upper lobe and right lung base.

## 2020-06-27 IMAGING — DX DG ABD PORTABLE 1V
1 series · 1 of 1 positions shown · non-contrast
Comparison: Abdominal x-ray dated May 24, 2019.

CLINICAL DATA: Constipation.

EXAM:
PORTABLE ABDOMEN - 1 VIEW

[abdomen kub]
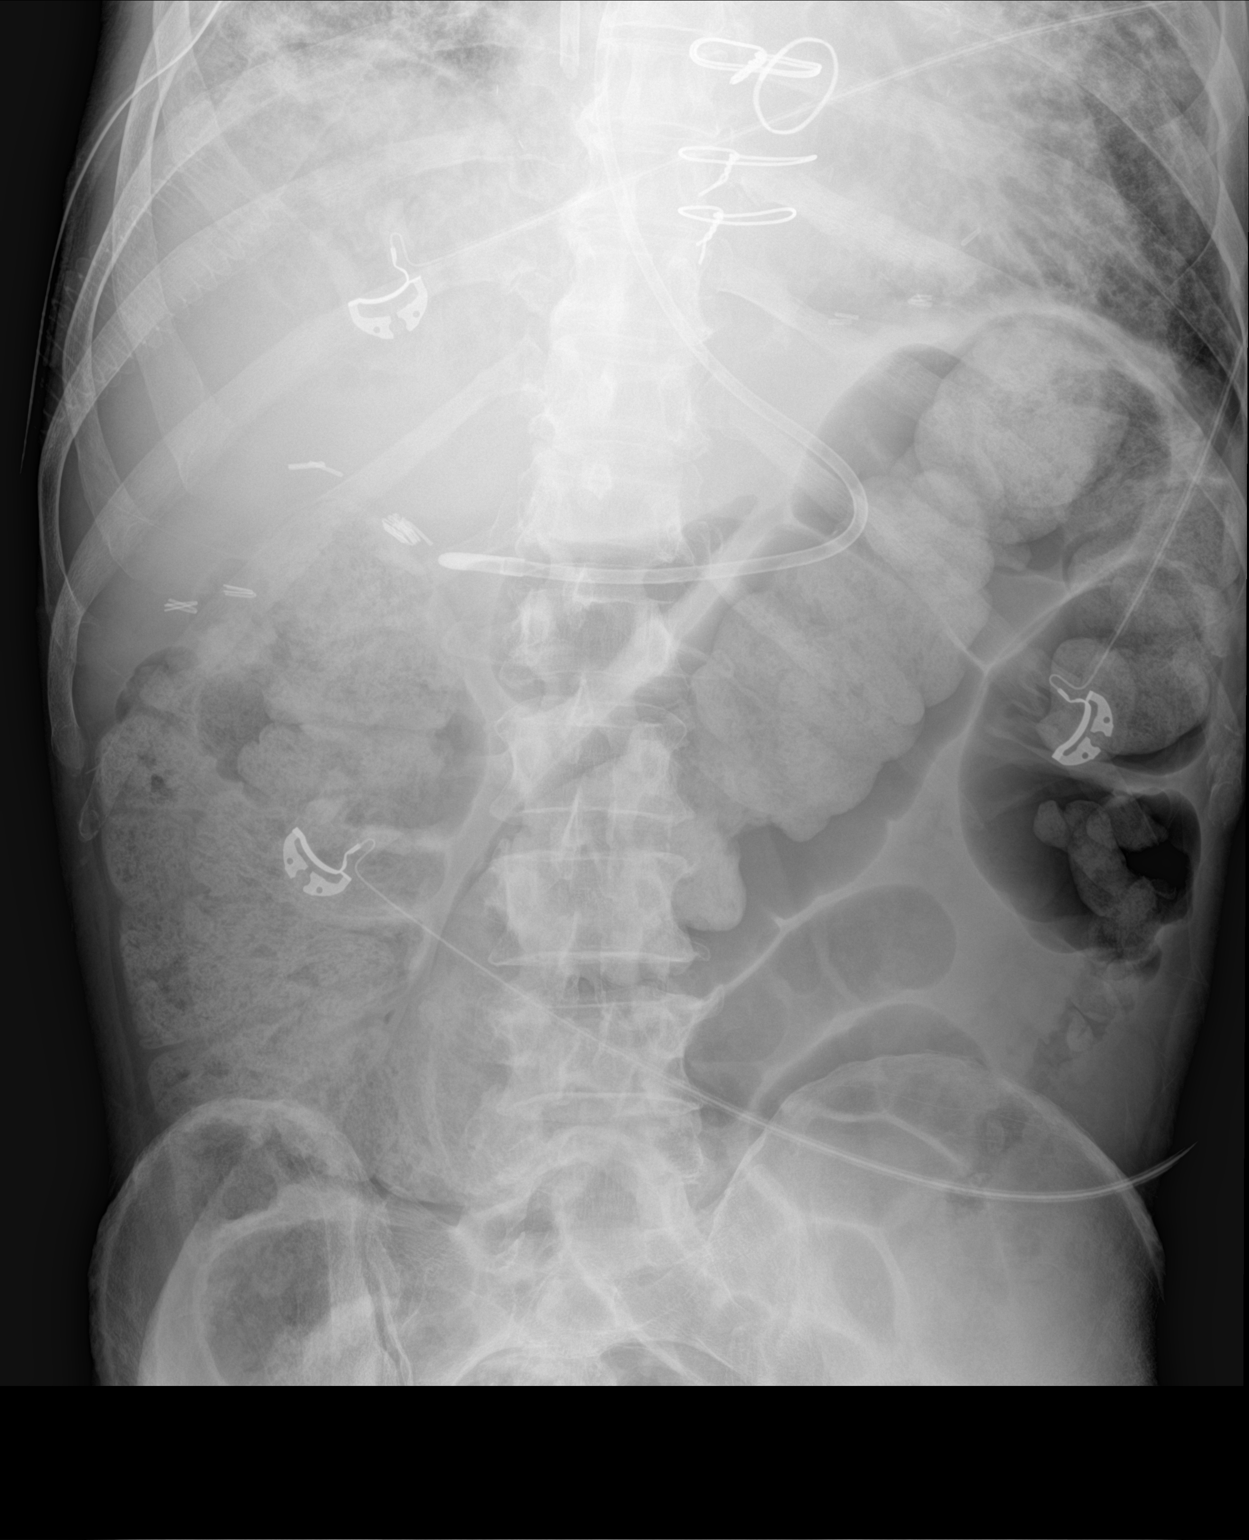

[1 of 1 positions shown; findings below may reference images not displayed]

FINDINGS: Feeding tube tip near the pylorus. Progressive moderate to large
colonic stool burden with new mild distention of the transverse
colon. Prominent nondilated air-filled loops of small bowel in the
left lower quadrant suggestive of ileus. No radio-opaque calculi or
other significant radiographic abnormality are seen. Prior
cholecystectomy. No acute osseous abnormality. Unchanged bibasilar
airspace disease.
IMPRESSION: Progressive moderate to large colonic stool burden with increased
mild distention of the transverse colon.
